# Patient Record
Sex: Female | Born: 1955 | Race: White | Hispanic: No
Health system: Southern US, Academic
[De-identification: ages and names within clinical notes are randomized; demographics above are authoritative.]

## PROBLEM LIST (undated history)

## (undated) ENCOUNTER — Encounter: Payer: PRIVATE HEALTH INSURANCE | Attending: Internal Medicine | Primary: Internal Medicine

## (undated) ENCOUNTER — Ambulatory Visit: Payer: PRIVATE HEALTH INSURANCE | Attending: Hematology & Oncology | Primary: Hematology & Oncology

## (undated) ENCOUNTER — Telehealth: Attending: Internal Medicine | Primary: Internal Medicine

## (undated) ENCOUNTER — Ambulatory Visit

## (undated) ENCOUNTER — Ambulatory Visit
Payer: MEDICARE | Attending: Student in an Organized Health Care Education/Training Program | Primary: Student in an Organized Health Care Education/Training Program

## (undated) ENCOUNTER — Telehealth

## (undated) ENCOUNTER — Encounter: Attending: Internal Medicine | Primary: Internal Medicine

## (undated) ENCOUNTER — Telehealth: Attending: Hematology & Oncology | Primary: Hematology & Oncology

## (undated) ENCOUNTER — Ambulatory Visit: Payer: PRIVATE HEALTH INSURANCE | Attending: Internal Medicine | Primary: Internal Medicine

## (undated) ENCOUNTER — Ambulatory Visit
Payer: Medicare (Managed Care) | Attending: Student in an Organized Health Care Education/Training Program | Primary: Student in an Organized Health Care Education/Training Program

## (undated) ENCOUNTER — Encounter: Payer: PRIVATE HEALTH INSURANCE | Attending: Hematology & Oncology | Primary: Hematology & Oncology

## (undated) ENCOUNTER — Encounter

## (undated) ENCOUNTER — Encounter: Attending: Hematology & Oncology | Primary: Hematology & Oncology

## (undated) ENCOUNTER — Encounter
Attending: Student in an Organized Health Care Education/Training Program | Primary: Student in an Organized Health Care Education/Training Program

## (undated) ENCOUNTER — Ambulatory Visit
Payer: PRIVATE HEALTH INSURANCE | Attending: Student in an Organized Health Care Education/Training Program | Primary: Student in an Organized Health Care Education/Training Program

## (undated) ENCOUNTER — Ambulatory Visit: Attending: Internal Medicine | Primary: Internal Medicine

## (undated) ENCOUNTER — Encounter
Payer: Medicare (Managed Care) | Attending: Student in an Organized Health Care Education/Training Program | Primary: Student in an Organized Health Care Education/Training Program

## (undated) DIAGNOSIS — Z9861 Coronary angioplasty status: Secondary | ICD-10-CM

## (undated) DIAGNOSIS — R195 Other fecal abnormalities: Secondary | ICD-10-CM

## (undated) DIAGNOSIS — J449 Chronic obstructive pulmonary disease, unspecified: Secondary | ICD-10-CM

## (undated) DIAGNOSIS — B1920 Unspecified viral hepatitis C without hepatic coma: Secondary | ICD-10-CM

## (undated) DIAGNOSIS — E785 Hyperlipidemia, unspecified: Secondary | ICD-10-CM

## (undated) DIAGNOSIS — R918 Other nonspecific abnormal finding of lung field: Secondary | ICD-10-CM

## (undated) DIAGNOSIS — B259 Cytomegaloviral disease, unspecified: Secondary | ICD-10-CM

## (undated) DIAGNOSIS — E039 Hypothyroidism, unspecified: Secondary | ICD-10-CM

## (undated) DIAGNOSIS — D696 Thrombocytopenia, unspecified: Secondary | ICD-10-CM

## (undated) DIAGNOSIS — I70219 Atherosclerosis of native arteries of extremities with intermittent claudication, unspecified extremity: Secondary | ICD-10-CM

## (undated) DIAGNOSIS — S72009A Fracture of unspecified part of neck of unspecified femur, initial encounter for closed fracture: Secondary | ICD-10-CM

## (undated) DIAGNOSIS — Z972 Presence of dental prosthetic device (complete) (partial): Secondary | ICD-10-CM

## (undated) DIAGNOSIS — D649 Anemia, unspecified: Secondary | ICD-10-CM

## (undated) DIAGNOSIS — K219 Gastro-esophageal reflux disease without esophagitis: Secondary | ICD-10-CM

## (undated) DIAGNOSIS — R945 Abnormal results of liver function studies: Secondary | ICD-10-CM

## (undated) DIAGNOSIS — I10 Essential (primary) hypertension: Secondary | ICD-10-CM

## (undated) HISTORY — DX: Cytomegaloviral disease, unspecified: B25.9

## (undated) HISTORY — PX: KNEE SURGERY: SHX244

## (undated) HISTORY — DX: Coronary angioplasty status: Z98.61

## (undated) HISTORY — DX: Unspecified viral hepatitis C without hepatic coma: B19.20

## (undated) HISTORY — DX: Thrombocytopenia, unspecified: D69.6

## (undated) HISTORY — DX: Fracture of unspecified part of neck of unspecified femur, initial encounter for closed fracture: S72.009A

## (undated) HISTORY — DX: Abnormal results of liver function studies: R94.5

## (undated) HISTORY — DX: Other fecal abnormalities: R19.5

## (undated) HISTORY — DX: Other nonspecific abnormal finding of lung field: R91.8

## (undated) HISTORY — DX: Atherosclerosis of native arteries of extremities with intermittent claudication, unspecified extremity: I70.219

## (undated) MED ORDER — HYDROXYCHLOROQUINE 200 MG TABLET: Freq: Two times a day (BID) | ORAL | 0.00000 days

## (undated) MED ORDER — PREDNISONE 2.5 MG TABLET: Freq: Every day | ORAL | 0 days

## (undated) MED ORDER — STIOLTO RESPIMAT 2.5 MCG-2.5 MCG/ACTUATION SOLUTION FOR INHALATION: RESPIRATORY_TRACT | 0 days

## (undated) MED ORDER — POTASSIUM CHLORIDE ER 20 MEQ TABLET,EXTENDED RELEASE(PART/CRYST): ORAL | 0.00000 days

---

## 2009-04-25 ENCOUNTER — Ambulatory Visit: Payer: Self-pay | Admitting: Family Medicine

## 2009-04-30 ENCOUNTER — Ambulatory Visit: Payer: Self-pay | Admitting: Family Medicine

## 2009-05-31 ENCOUNTER — Ambulatory Visit: Payer: Self-pay | Admitting: Orthopedic Surgery

## 2009-06-18 ENCOUNTER — Ambulatory Visit: Payer: Self-pay | Admitting: Orthopedic Surgery

## 2009-07-18 ENCOUNTER — Encounter: Payer: Self-pay | Admitting: Orthopedic Surgery

## 2009-07-31 ENCOUNTER — Encounter: Payer: Self-pay | Admitting: Orthopedic Surgery

## 2009-08-31 ENCOUNTER — Encounter: Payer: Self-pay | Admitting: Orthopedic Surgery

## 2012-12-22 ENCOUNTER — Ambulatory Visit: Payer: Self-pay | Admitting: Family Medicine

## 2014-02-08 ENCOUNTER — Ambulatory Visit: Payer: Self-pay | Admitting: Family Medicine

## 2014-04-10 ENCOUNTER — Emergency Department: Payer: Self-pay | Admitting: Emergency Medicine

## 2014-06-11 ENCOUNTER — Emergency Department: Payer: Self-pay | Admitting: Emergency Medicine

## 2014-06-11 LAB — COMPREHENSIVE METABOLIC PANEL
ALT: 28 U/L
ANION GAP: 7 (ref 7–16)
Albumin: 3.6 g/dL (ref 3.4–5.0)
Alkaline Phosphatase: 101 U/L
BUN: 15 mg/dL (ref 7–18)
Bilirubin,Total: 0.6 mg/dL (ref 0.2–1.0)
CHLORIDE: 103 mmol/L (ref 98–107)
CREATININE: 0.73 mg/dL (ref 0.60–1.30)
Calcium, Total: 8.9 mg/dL (ref 8.5–10.1)
Co2: 32 mmol/L (ref 21–32)
Glucose: 100 mg/dL — ABNORMAL HIGH (ref 65–99)
Osmolality: 284 (ref 275–301)
Potassium: 3.1 mmol/L — ABNORMAL LOW (ref 3.5–5.1)
SGOT(AST): 25 U/L (ref 15–37)
Sodium: 142 mmol/L (ref 136–145)
Total Protein: 7.8 g/dL (ref 6.4–8.2)

## 2014-06-11 LAB — TROPONIN I: Troponin-I: <0.02 ng/mL

## 2014-06-11 LAB — CBC
HCT: 46.5 % (ref 35.0–47.0)
HGB: 15.4 g/dL (ref 12.0–16.0)
MCH: 30.4 pg (ref 26.0–34.0)
MCHC: 33.2 g/dL (ref 32.0–36.0)
MCV: 92 fL (ref 80–100)
PLATELETS: 127 10*3/uL — AB (ref 150–440)
RBC: 5.08 10*6/uL (ref 3.80–5.20)
RDW: 14.5 % (ref 11.5–14.5)
WBC: 6.1 10*3/uL (ref 3.6–11.0)

## 2015-01-29 DIAGNOSIS — R195 Other fecal abnormalities: Secondary | ICD-10-CM

## 2015-01-29 HISTORY — DX: Other fecal abnormalities: R19.5

## 2015-05-23 ENCOUNTER — Other Ambulatory Visit: Payer: Self-pay | Admitting: Nurse Practitioner

## 2015-05-23 DIAGNOSIS — M79604 Pain in right leg: Secondary | ICD-10-CM

## 2015-05-24 ENCOUNTER — Ambulatory Visit
Admission: RE | Admit: 2015-05-24 | Discharge: 2015-05-24 | Disposition: A | Discharge status: Home / Self Care | Payer: BLUE CROSS/BLUE SHIELD | Source: Ambulatory Visit | Attending: Nurse Practitioner | Admitting: Nurse Practitioner

## 2015-05-24 DIAGNOSIS — M79604 Pain in right leg: Secondary | ICD-10-CM | POA: Diagnosis not present

## 2015-05-24 DIAGNOSIS — M7989 Other specified soft tissue disorders: Secondary | ICD-10-CM | POA: Diagnosis not present

## 2015-11-06 ENCOUNTER — Other Ambulatory Visit: Payer: Self-pay | Admitting: Preventative Medicine

## 2015-11-06 DIAGNOSIS — Z1231 Encounter for screening mammogram for malignant neoplasm of breast: Secondary | ICD-10-CM

## 2016-05-08 ENCOUNTER — Other Ambulatory Visit: Payer: Self-pay | Admitting: Student

## 2016-05-08 DIAGNOSIS — G8929 Other chronic pain: Secondary | ICD-10-CM

## 2016-05-08 DIAGNOSIS — M25561 Pain in right knee: Principal | ICD-10-CM

## 2016-05-19 ENCOUNTER — Encounter: Payer: Self-pay | Admitting: Radiology

## 2016-05-19 ENCOUNTER — Ambulatory Visit
Admission: RE | Admit: 2016-05-19 | Discharge: 2016-05-19 | Disposition: A | Discharge status: Home / Self Care | Payer: BLUE CROSS/BLUE SHIELD | Source: Ambulatory Visit | Attending: Student | Admitting: Student

## 2016-05-19 DIAGNOSIS — G8929 Other chronic pain: Secondary | ICD-10-CM

## 2016-05-19 DIAGNOSIS — R937 Abnormal findings on diagnostic imaging of other parts of musculoskeletal system: Secondary | ICD-10-CM | POA: Diagnosis not present

## 2016-05-19 DIAGNOSIS — M25561 Pain in right knee: Secondary | ICD-10-CM | POA: Diagnosis present

## 2016-05-19 DIAGNOSIS — I8391 Asymptomatic varicose veins of right lower extremity: Secondary | ICD-10-CM | POA: Insufficient documentation

## 2016-07-07 ENCOUNTER — Encounter: Payer: Self-pay | Admitting: Emergency Medicine

## 2016-07-07 ENCOUNTER — Emergency Department: Payer: BLUE CROSS/BLUE SHIELD

## 2016-07-07 ENCOUNTER — Observation Stay
Admission: EM | Admit: 2016-07-07 | Discharge: 2016-07-12 | Disposition: A | Discharge status: Home / Self Care | Payer: BLUE CROSS/BLUE SHIELD | Attending: Internal Medicine | Admitting: Internal Medicine

## 2016-07-07 DIAGNOSIS — B1920 Unspecified viral hepatitis C without hepatic coma: Secondary | ICD-10-CM

## 2016-07-07 DIAGNOSIS — D649 Anemia, unspecified: Secondary | ICD-10-CM | POA: Diagnosis present

## 2016-07-07 DIAGNOSIS — N179 Acute kidney failure, unspecified: Secondary | ICD-10-CM | POA: Diagnosis present

## 2016-07-07 DIAGNOSIS — E785 Hyperlipidemia, unspecified: Secondary | ICD-10-CM | POA: Diagnosis not present

## 2016-07-07 DIAGNOSIS — R195 Other fecal abnormalities: Secondary | ICD-10-CM

## 2016-07-07 DIAGNOSIS — J449 Chronic obstructive pulmonary disease, unspecified: Secondary | ICD-10-CM | POA: Diagnosis not present

## 2016-07-07 DIAGNOSIS — I708 Atherosclerosis of other arteries: Secondary | ICD-10-CM | POA: Insufficient documentation

## 2016-07-07 DIAGNOSIS — F17210 Nicotine dependence, cigarettes, uncomplicated: Secondary | ICD-10-CM | POA: Diagnosis not present

## 2016-07-07 DIAGNOSIS — Z720 Tobacco use: Secondary | ICD-10-CM

## 2016-07-07 DIAGNOSIS — Z8249 Family history of ischemic heart disease and other diseases of the circulatory system: Secondary | ICD-10-CM | POA: Insufficient documentation

## 2016-07-07 DIAGNOSIS — Z833 Family history of diabetes mellitus: Secondary | ICD-10-CM | POA: Insufficient documentation

## 2016-07-07 DIAGNOSIS — I513 Intracardiac thrombosis, not elsewhere classified: Secondary | ICD-10-CM | POA: Insufficient documentation

## 2016-07-07 DIAGNOSIS — I700 Atherosclerosis of aorta: Secondary | ICD-10-CM | POA: Insufficient documentation

## 2016-07-07 DIAGNOSIS — D591 Other autoimmune hemolytic anemias: Secondary | ICD-10-CM

## 2016-07-07 DIAGNOSIS — I959 Hypotension, unspecified: Secondary | ICD-10-CM

## 2016-07-07 DIAGNOSIS — R768 Other specified abnormal immunological findings in serum: Secondary | ICD-10-CM

## 2016-07-07 DIAGNOSIS — I70222 Atherosclerosis of native arteries of extremities with rest pain, left leg: Secondary | ICD-10-CM | POA: Diagnosis not present

## 2016-07-07 DIAGNOSIS — I739 Peripheral vascular disease, unspecified: Secondary | ICD-10-CM

## 2016-07-07 DIAGNOSIS — D5912 Cold autoimmune hemolytic anemia: Secondary | ICD-10-CM

## 2016-07-07 DIAGNOSIS — I771 Stricture of artery: Secondary | ICD-10-CM | POA: Diagnosis present

## 2016-07-07 DIAGNOSIS — M79606 Pain in leg, unspecified: Secondary | ICD-10-CM | POA: Diagnosis present

## 2016-07-07 DIAGNOSIS — Z9861 Coronary angioplasty status: Secondary | ICD-10-CM

## 2016-07-07 DIAGNOSIS — Z885 Allergy status to narcotic agent status: Secondary | ICD-10-CM | POA: Insufficient documentation

## 2016-07-07 DIAGNOSIS — B259 Cytomegaloviral disease, unspecified: Secondary | ICD-10-CM

## 2016-07-07 DIAGNOSIS — I10 Essential (primary) hypertension: Secondary | ICD-10-CM | POA: Diagnosis present

## 2016-07-07 DIAGNOSIS — Z7982 Long term (current) use of aspirin: Secondary | ICD-10-CM | POA: Insufficient documentation

## 2016-07-07 DIAGNOSIS — N189 Chronic kidney disease, unspecified: Secondary | ICD-10-CM | POA: Diagnosis present

## 2016-07-07 HISTORY — DX: Hyperlipidemia, unspecified: E78.5

## 2016-07-07 HISTORY — DX: Chronic obstructive pulmonary disease, unspecified: J44.9

## 2016-07-07 HISTORY — DX: Essential (primary) hypertension: I10

## 2016-07-07 LAB — URINALYSIS COMPLETE WITH MICROSCOPIC (ARMC ONLY)
BACTERIA UA: NONE SEEN
Bilirubin Urine: NEGATIVE
Glucose, UA: NEGATIVE mg/dL
Hgb urine dipstick: NEGATIVE
Ketones, ur: NEGATIVE mg/dL
LEUKOCYTES UA: NEGATIVE
Nitrite: NEGATIVE
PROTEIN: 30 mg/dL — AB
SPECIFIC GRAVITY, URINE: 1.017 (ref 1.005–1.030)
pH: 5 (ref 5.0–8.0)

## 2016-07-07 LAB — CBC
HEMATOCRIT: 24 % — AB (ref 35.0–47.0)
Hemoglobin: 8.3 g/dL — ABNORMAL LOW (ref 12.0–16.0)
MCH: 34.1 pg — AB (ref 26.0–34.0)
MCHC: 34.5 g/dL (ref 32.0–36.0)
MCV: 99 fL (ref 80.0–100.0)
Platelets: 212 10*3/uL (ref 150–440)
RBC: 2.43 MIL/uL — ABNORMAL LOW (ref 3.80–5.20)
RDW: 20.4 % — AB (ref 11.5–14.5)
WBC: 8.8 10*3/uL (ref 3.6–11.0)

## 2016-07-07 LAB — BASIC METABOLIC PANEL
Anion gap: 7 (ref 5–15)
BUN: 30 mg/dL — AB (ref 6–20)
CHLORIDE: 103 mmol/L (ref 101–111)
CO2: 27 mmol/L (ref 22–32)
CREATININE: 1.69 mg/dL — AB (ref 0.44–1.00)
Calcium: 8.8 mg/dL — ABNORMAL LOW (ref 8.9–10.3)
GFR calc Af Amer: 37 mL/min — ABNORMAL LOW (ref >60)
GFR calc non Af Amer: 32 mL/min — ABNORMAL LOW (ref >60)
GLUCOSE: 107 mg/dL — AB (ref 65–99)
POTASSIUM: 4.1 mmol/L (ref 3.5–5.1)
Sodium: 137 mmol/L (ref 135–145)

## 2016-07-07 LAB — FOLATE: Folate: 42 ng/mL (ref >5.9)

## 2016-07-07 LAB — LACTIC ACID, PLASMA: LACTIC ACID, VENOUS: 1.9 mmol/L (ref 0.5–1.9)

## 2016-07-07 LAB — IRON AND TIBC
Iron: 77 ug/dL (ref 28–170)
SATURATION RATIOS: 30 % (ref 10.4–31.8)
TIBC: 260 ug/dL (ref 250–450)
UIBC: 183 ug/dL

## 2016-07-07 LAB — HEPATIC FUNCTION PANEL
ALBUMIN: 3.8 g/dL (ref 3.5–5.0)
ALT: 9 U/L — AB (ref 14–54)
AST: 26 U/L (ref 15–41)
Alkaline Phosphatase: 66 U/L (ref 38–126)
BILIRUBIN INDIRECT: 1.1 mg/dL — AB (ref 0.3–0.9)
Bilirubin, Direct: 0.3 mg/dL (ref 0.1–0.5)
TOTAL PROTEIN: 7.4 g/dL (ref 6.5–8.1)
Total Bilirubin: 1.4 mg/dL — ABNORMAL HIGH (ref 0.3–1.2)

## 2016-07-07 LAB — FERRITIN: Ferritin: 515 ng/mL — ABNORMAL HIGH (ref 11–307)

## 2016-07-07 LAB — CK: CK TOTAL: 27 U/L — AB (ref 38–234)

## 2016-07-07 MED ORDER — ACETAMINOPHEN 325 MG PO TABS
650.0000 mg | ORAL_TABLET | Freq: Four times a day (QID) | ORAL | Status: DC | PRN
Start: 2016-07-07 — End: 2016-07-12
  Administered 2016-07-08: 650 mg via ORAL
  Filled 2016-07-07 (×2): qty 2

## 2016-07-07 MED ORDER — SODIUM CHLORIDE 0.9 % IV BOLUS (SEPSIS)
1000.0000 mL | Freq: Once | INTRAVENOUS | Status: AC
  Administered 2016-07-07: 1000 mL via INTRAVENOUS

## 2016-07-07 MED ORDER — ONDANSETRON HCL 4 MG/2ML IJ SOLN: 4.0000 mg | Freq: Four times a day (QID) | INTRAMUSCULAR | Status: DC | PRN

## 2016-07-07 MED ORDER — PRAVASTATIN SODIUM 40 MG PO TABS
40.0000 mg | ORAL_TABLET | Freq: Every evening | ORAL | Status: DC
  Administered 2016-07-07 – 2016-07-11 (×4): 40 mg via ORAL
  Filled 2016-07-07 (×4): qty 1

## 2016-07-07 MED ORDER — ASPIRIN EC 81 MG PO TBEC
81.0000 mg | DELAYED_RELEASE_TABLET | Freq: Every day | ORAL | Status: DC
  Filled 2016-07-07: qty 1

## 2016-07-07 MED ORDER — HEPARIN SODIUM (PORCINE) 5000 UNIT/ML IJ SOLN
5000.0000 [IU] | Freq: Three times a day (TID) | INTRAMUSCULAR | Status: DC
  Administered 2016-07-08: 5000 [IU] via SUBCUTANEOUS
  Filled 2016-07-07: qty 1

## 2016-07-07 MED ORDER — IOPAMIDOL (ISOVUE-370) INJECTION 76%
75.0000 mL | Freq: Once | INTRAVENOUS | Status: AC | PRN
  Administered 2016-07-07: 75 mL via INTRAVENOUS
  Filled 2016-07-07: qty 75

## 2016-07-07 MED ORDER — ALBUTEROL SULFATE (2.5 MG/3ML) 0.083% IN NEBU: 3.0000 mL | INHALATION_SOLUTION | RESPIRATORY_TRACT | Status: DC | PRN

## 2016-07-07 MED ORDER — ACETAMINOPHEN 650 MG RE SUPP: 650.0000 mg | Freq: Four times a day (QID) | RECTAL | Status: DC | PRN

## 2016-07-07 MED ORDER — SODIUM CHLORIDE 0.9 % IV SOLN
INTRAVENOUS | Status: AC
  Administered 2016-07-07: 23:00:00 via INTRAVENOUS

## 2016-07-07 MED ORDER — ONDANSETRON HCL 4 MG PO TABS: 4.0000 mg | ORAL_TABLET | Freq: Four times a day (QID) | ORAL | Status: DC | PRN

## 2016-07-07 NOTE — ED Notes (Signed)
Pt ambulates to restroom without difficulty, steady gait noted.

## 2016-07-07 NOTE — ED Triage Notes (Signed)
While at work on Sunday patient felt dizzy and left foot and leg felt numb.  States she has been laying in the bed since Sunday night.  Seen by PCP today and sent to ED for low blood pressure.

## 2016-07-07 NOTE — ED Provider Notes (Signed)
[facility] Emergency Department Provider Note    First MD Initiated Contact with Patient 07/07/16 1649     (approximate)  I have reviewed the triage vital signs and the nursing notes.   HISTORY  Chief Complaint Hypotension    HPI Tina Page is a 60 y.o. female with a history of hypertension presents with 3 days of dizziness and lightheadedness. Patient states that on Sunday she felt lightheaded and dizzy and felt that her left leg went numb and weak. States that the weakness has resolved but she still feels some numbness to the bottom of her left foot. States that she's felt weak and lightheaded since then has been lying in bed since Sunday night. Patient with her PCP today at the recommendation of her daughter. In the primary care physician's office she was hypotensive with systolic BP in low [id number] and recheck in 70's and appeared pale and fatigued and was directed to the ER. She denies any dark stools or hematochezia. Has been scheduled within the past year but this was for melena. She denies any chest pain or shortness of breath. No dysuria. Denies any fevers.   Past Medical History:  Diagnosis Date  . COPD (chronic obstructive pulmonary disease) ([place])   . HLD (hyperlipidemia)   . Hypertension     Patient Active Problem List   Diagnosis Date Noted  . Anemia 07/07/2016  . Leg pain 07/07/2016  . HTN (hypertension) 07/07/2016  . HLD (hyperlipidemia) 07/07/2016  . COPD (chronic obstructive pulmonary disease) ([place]) 07/07/2016  . AKI (acute kidney injury) ([place]) 07/07/2016    Past Surgical History:  Procedure Laterality Date  . KNEE SURGERY      Prior to Admission medications   Medication Sig Start Date End Date Taking? Authorizing Provider  albuterol (PROVENTIL HFA;VENTOLIN HFA) 108 (90 Base) MCG/ACT inhaler Inhale 2 puffs into the lungs as needed for wheezing.    Yes Historical Provider, MD  aspirin EC 81 MG tablet Take 81 mg by mouth daily.    Yes Historical Provider, MD  lisinopril (PRINIVIL,ZESTRIL) 5 MG tablet Take 5 mg by mouth daily.   Yes Historical Provider, MD  potassium chloride SA (K-DUR,KLOR-CON) 20 MEQ tablet Take 20 mEq by mouth daily.   Yes Historical Provider, MD  pravastatin (PRAVACHOL) 40 MG tablet Take 40 mg by mouth every evening.   Yes Historical Provider, MD    Allergies Codeine  Family History  Problem Relation Age of Onset  . Diabetes Mother   . Hypertension Mother     Social History Social History  Substance Use Topics  . Smoking status: Current Every Day Smoker    Packs/day: 0.50    Types: Cigarettes  . Smokeless tobacco: Never Used  . Alcohol use No    Review of Systems Patient denies headaches, rhinorrhea, blurry vision, numbness, shortness of breath, chest pain, edema, cough, abdominal pain, nausea, vomiting, diarrhea, dysuria, fevers, rashes or hallucinations unless otherwise stated above in HPI. ____________________________________________   PHYSICAL EXAM:  VITAL SIGNS: Vitals:   07/07/16 1543 07/07/16 1937  BP: 105/76 123/60  Pulse: 94 97  Resp:  16  Temp: 99.1 F (37.3 C) 98.1 F (36.7 C)    Constitutional: Alert and oriented. ill appearing but in no acute distress. Eyes: Conjunctivae are pale. PERRL. EOMI. Head: Atraumatic. Nose: No congestion/rhinnorhea. Mouth/Throat: Mucous membranes are moist.  Oropharynx non-erythematous. Neck: No stridor. Painless ROM. No cervical spine tenderness to palpation Hematological/Lymphatic/Immunilogical: No cervical lymphadenopathy. Cardiovascular: Normal rate, regular rhythm.  Grossly normal heart sounds.  Good peripheral circulation. Respiratory: Normal respiratory effort.  No retractions. Lungs CTAB. Gastrointestinal: Soft and nontender. No distention. No abdominal bruits. No CVA tenderness. Genitourinary:  Musculoskeletal: No lower extremity tenderness nor edema.  No joint effusions. Neurologic:  Normal speech and language. No  gross focal neurologic deficits are appreciated. No gait instability. Skin:  Skin is warm, dry and intact. No rash noted. Psychiatric: Mood and affect are normal. Speech and behavior are normal. _________________________________________   LABS (all labs ordered are listed, but only abnormal results are displayed)  Results for orders placed or performed during the hospital encounter of 07/07/16 (from the past 24 hour(s))  Basic metabolic panel     Status: Abnormal   Collection Time: 07/07/16  3:46 PM  Result Value Ref Range   Sodium 137 135 - 145 mmol/L   Potassium 4.1 3.5 - 5.1 mmol/L   Chloride 103 101 - 111 mmol/L   CO2 27 22 - 32 mmol/L   Glucose, Bld 107 (H) 65 - 99 mg/dL   BUN 30 (H) 6 - 20 mg/dL   Creatinine, Ser 1.69 (H) 0.44 - 1.00 mg/dL   Calcium 8.8 (L) 8.9 - 10.3 mg/dL   GFR calc non Af Amer 32 (L) >60 mL/min   GFR calc Af Amer 37 (L) >60 mL/min   Anion gap 7 5 - 15  CBC     Status: Abnormal   Collection Time: 07/07/16  3:46 PM  Result Value Ref Range   WBC 8.8 3.6 - 11.0 K/uL   RBC 2.43 (L) 3.80 - 5.20 MIL/uL   Hemoglobin 8.3 (L) 12.0 - 16.0 g/dL   HCT 24.0 (L) 35.0 - 47.0 %   MCV 99.0 80.0 - 100.0 fL   MCH 34.1 (H) 26.0 - 34.0 pg   MCHC 34.5 32.0 - 36.0 g/dL   RDW 20.4 (H) 11.5 - 14.5 %   Platelets 212 150 - 440 K/uL  Urinalysis complete, with microscopic     Status: Abnormal   Collection Time: 07/07/16  3:46 PM  Result Value Ref Range   Color, Urine AMBER (A) YELLOW   APPearance CLEAR (A) CLEAR   Glucose, UA NEGATIVE NEGATIVE mg/dL   Bilirubin Urine NEGATIVE NEGATIVE   Ketones, ur NEGATIVE NEGATIVE mg/dL   Specific Gravity, Urine 1.017 1.005 - 1.030   Hgb urine dipstick NEGATIVE NEGATIVE   pH 5.0 5.0 - 8.0   Protein, ur 30 (A) NEGATIVE mg/dL   Nitrite NEGATIVE NEGATIVE   Leukocytes, UA NEGATIVE NEGATIVE   RBC / HPF 0-5 0 - 5 RBC/hpf   WBC, UA 0-5 0 - 5 WBC/hpf   Bacteria, UA NONE SEEN NONE SEEN   Squamous Epithelial / LPF 0-5 (A) NONE SEEN   Mucous  PRESENT    Hyaline Casts, UA PRESENT   Ferritin     Status: Abnormal   Collection Time: 07/07/16  3:46 PM  Result Value Ref Range   Ferritin 515 (H) 11 - 307 ng/mL  Iron and TIBC     Status: None   Collection Time: 07/07/16  3:46 PM  Result Value Ref Range   Iron 77 28 - 170 ug/dL   TIBC 260 250 - 450 ug/dL   Saturation Ratios 30 10.4 - 31.8 %   UIBC 183 ug/dL  CK     Status: Abnormal   Collection Time: 07/07/16  3:46 PM  Result Value Ref Range   Total CK 27 (L) 38 - 234 U/L  Hepatic function  panel     Status: Abnormal   Collection Time: 07/07/16  3:46 PM  Result Value Ref Range   Total Protein 7.4 6.5 - 8.1 g/dL   Albumin 3.8 3.5 - 5.0 g/dL   AST 26 15 - 41 U/L   ALT 9 (L) 14 - 54 U/L   Alkaline Phosphatase 66 38 - 126 U/L   Total Bilirubin 1.4 (H) 0.3 - 1.2 mg/dL   Bilirubin, Direct 0.3 0.1 - 0.5 mg/dL   Indirect Bilirubin 1.1 (H) 0.3 - 0.9 mg/dL  Lactic acid, plasma     Status: None   Collection Time: 07/07/16  5:23 PM  Result Value Ref Range   Lactic Acid, Venous 1.9 0.5 - 1.9 mmol/L  Type and screen [place]     Status: None (Preliminary result)   Collection Time: 07/07/16  5:23 PM  Result Value Ref Range   ABO/RH(D) A NEG    Antibody Screen POS    Sample Expiration 07/10/2016    ____________________________________________  EKG My review and personal interpretation at Time: 17:41   Indication: hypotension  Rate: 85  Rhythm: sinus Axis: normal Other: occasional PAC, no acute ischemic changes. ____________________________________________  RADIOLOGY  I personally reviewed all radiographic images ordered to evaluate for the above acute complaints and reviewed radiology reports and findings.  These findings were personally discussed with the patient.  Please see medical record for radiology report.  ____________________________________________   PROCEDURES  Procedure(s) performed: none    Critical Care performed:  no ____________________________________________   INITIAL IMPRESSION / ASSESSMENT AND PLAN / ED COURSE  Pertinent labs & imaging results that were available during my care of the patient were reviewed by me and considered in my medical decision making (see chart for details).  DDX: claudication, cva, dissection, aki, acute blood loss anemia,   Tina Page is a 60 y.o. who presents to the ED with Generalized fatigue malaise and an episode of left foot and leg numbness. Patient arrives with low-grade temperature but normotensive but appears pale and ill. EKG shows no evidence of acute ischemia. Her abdominal exam is reassuring without any pulsatile masses. I do appreciate distal pulses to the left lower extremity. Based on her presentation I am concerned for possible dissection and therefore CT imaging ordered to evaluate for acute vascular pathology. Blood work shows evidence of acute kidney injury. She also has evidence of anemia. She's not on any blood thinners at this time.  The patient will be placed on continuous pulse oximetry and telemetry for monitoring.  Laboratory evaluation will be sent to evaluate for the above complaints. The patient will be placed on continuous pulse oximetry and telemetry for monitoring.  Laboratory evaluation will be sent to evaluate for the above complaints.     Clinical Course as of Jul 07 2132  Tue Jul 07, 2016  1731 Stool guaiac is weekly positive.  [PR]    Clinical Course User Index [PR] Merlyn Lot, MD   CT imaging does show evidence of left common iliac stenosis but has distal perfusion. Patient with complex presentation and likely suffered GI bleed given her positive guaiac stools and acute anemia. Also likely suffering some component of acute kidney injury. Patient receiving IV fluids. CT of the head shows no evidence of stroke. Based on her complex presentation do feel patient will require admission for further evaluation and management.  Have  discussed with the patient and available family all diagnostics and treatments performed thus far and all  questions were answered to the best of my ability. The patient demonstrates understanding and agreement with plan.   ____________________________________________   FINAL CLINICAL IMPRESSION(S) / ED DIAGNOSES  Final diagnoses:  Hypotension, unspecified hypotension type  AKI (acute kidney injury) ([place])  Guaiac positive stools  Iliac artery stenosis, left ([facility])      NEW MEDICATIONS STARTED DURING THIS VISIT:  New Prescriptions   No medications on file     Note:  This document was prepared using Dragon voice recognition software and may include unintentional dictation errors.    Merlyn Lot, MD 07/07/16 2133

## 2016-07-07 NOTE — H&P (Signed)
[facility] [place] NAME: Tina Page    MR#:  [id number]  DATE OF BIRTH:  August 24, 1956  DATE OF ADMISSION:  07/07/2016  PRIMARY CARE PHYSICIAN: Frazier Richards, MD   REQUESTING/REFERRING PHYSICIAN: Quentin Cornwall, MD  CHIEF COMPLAINT:   Chief Complaint  Patient presents with  . Hypotension    HISTORY OF PRESENT ILLNESS:  Tina Page  is a 60 y.o. female who presents with Left leg pain and dizziness for the past 3 days. Patient was also found to be anemic with guaiac positive stools here. She was also found on lab work to have some AK I. Her leg pain is worse with ambulation, and improve some with rest. Patient denies any recent melena, though states that she oftentimes does not look at her stool. Hospitals were called for admission  PAST MEDICAL HISTORY:   Past Medical History:  Diagnosis Date  . COPD (chronic obstructive pulmonary disease) ([place])   . HLD (hyperlipidemia)   . Hypertension     PAST SURGICAL HISTORY:   Past Surgical History:  Procedure Laterality Date  . KNEE SURGERY      SOCIAL HISTORY:   Social History  Substance Use Topics  . Smoking status: Current Every Day Smoker    Packs/day: 0.50    Types: Cigarettes  . Smokeless tobacco: Never Used  . Alcohol use No    FAMILY HISTORY:   Family History  Problem Relation Age of Onset  . Diabetes Mother   . Hypertension Mother     DRUG ALLERGIES:   Allergies  Allergen Reactions  . Codeine Anaphylaxis    MEDICATIONS AT HOME:   Prior to Admission medications   Medication Sig Start Date End Date Taking? Authorizing Provider  albuterol (PROVENTIL HFA;VENTOLIN HFA) 108 (90 Base) MCG/ACT inhaler Inhale 2 puffs into the lungs as needed for wheezing.    Yes Historical Provider, MD  aspirin EC 81 MG tablet Take 81 mg by mouth daily.   Yes Historical Provider, MD  lisinopril (PRINIVIL,ZESTRIL) 5 MG tablet Take 5 mg by mouth daily.   Yes Historical  Provider, MD  potassium chloride SA (K-DUR,KLOR-CON) 20 MEQ tablet Take 20 mEq by mouth daily.   Yes Historical Provider, MD  pravastatin (PRAVACHOL) 40 MG tablet Take 40 mg by mouth every evening.   Yes Historical Provider, MD    REVIEW OF SYSTEMS:  Review of Systems  Constitutional: Negative for chills, fever, malaise/fatigue and weight loss.  HENT: Negative for ear pain, hearing loss and tinnitus.   Eyes: Negative for blurred vision, double vision, pain and redness.  Respiratory: Negative for cough, hemoptysis and shortness of breath.   Cardiovascular: Negative for chest pain, palpitations, orthopnea and leg swelling.  Gastrointestinal: Negative for abdominal pain, constipation, diarrhea, nausea and vomiting.  Genitourinary: Negative for dysuria, frequency and hematuria.  Musculoskeletal: Negative for back pain, joint pain and neck pain.       Left leg pain  Skin:       No acne, rash, or lesions  Neurological: Positive for dizziness. Negative for tremors, focal weakness and weakness.  Endo/Heme/Allergies: Negative for polydipsia. Does not bruise/bleed easily.  Psychiatric/Behavioral: Negative for depression. The patient is not nervous/anxious and does not have insomnia.      VITAL SIGNS:   Vitals:   07/07/16 1542 07/07/16 1543 07/07/16 1937  BP:  105/76 123/60  Pulse:  94 97  Resp:   16  Temp:  99.1 F (37.3 C) 98.1 F (  36.7 C)  TempSrc:  Oral Oral  SpO2:  100% 96%  Weight: 64.9 kg (143 lb)    Height: 5\' 3"  (1.6 m)     Wt Readings from Last 3 Encounters:  07/07/16 64.9 kg (143 lb)    PHYSICAL EXAMINATION:  Physical Exam  Vitals reviewed. Constitutional: She is oriented to person, place, and time. She appears well-developed and well-nourished. No distress.  HENT:  Head: Normocephalic and atraumatic.  Mouth/Throat: Oropharynx is clear and moist.  Eyes: Conjunctivae and EOM are normal. Pupils are equal, round, and reactive to light. No scleral icterus.  Neck: Normal  range of motion. Neck supple. No JVD present. No thyromegaly present.  Cardiovascular: Normal rate, regular rhythm and intact distal pulses.  Exam reveals no gallop and no friction rub.   No murmur heard. Respiratory: Effort normal and breath sounds normal. No respiratory distress. She has no wheezes. She has no rales.  GI: Soft. Bowel sounds are normal. She exhibits no distension. There is no tenderness.  Musculoskeletal: Normal range of motion. She exhibits no edema.  No arthritis, no gout  Lymphadenopathy:    She has no cervical adenopathy.  Neurological: She is alert and oriented to person, place, and time. No cranial nerve deficit.  No dysarthria, no aphasia  Skin: Skin is warm and dry. No rash noted. No erythema.  Psychiatric: She has a normal mood and affect. Her behavior is normal. Judgment and thought content normal.    LABORATORY PANEL:   CBC  Recent Labs Lab 07/07/16 1546  WBC 8.8  HGB 8.3*  HCT 24.0*  PLT 212   ------------------------------------------------------------------------------------------------------------------  Chemistries   Recent Labs Lab 07/07/16 1546  NA 137  K 4.1  CL 103  CO2 27  GLUCOSE 107*  BUN 30*  CREATININE 1.69*  CALCIUM 8.8*  AST 26  ALT 9*  ALKPHOS 66  BILITOT 1.4*   ------------------------------------------------------------------------------------------------------------------  Cardiac Enzymes No results for input(s): TROPONINI in the last 168 hours. ------------------------------------------------------------------------------------------------------------------  RADIOLOGY:  Dg Chest 2 View  Result Date: 07/07/2016 CLINICAL DATA:  60 year old female felt dizzy 2 days ago. Low blood pressure. Initial encounter. EXAM: CHEST  2 VIEW COMPARISON:  06/11/2014 chest x-ray. FINDINGS: Central pulmonary vascular prominence. No infiltrate, congestive heart failure or pneumothorax. No plain film evidence of pulmonary malignancy.  Heart size within normal limits. Calcified aorta. No acute osseous abnormality. IMPRESSION: No active cardiopulmonary disease. Electronically Signed   By: Genia Del M.D.   On: 07/07/2016 17:17   Ct Head Wo Contrast  Result Date: 07/07/2016 CLINICAL DATA:  60 y/o  F; left leg numbness. EXAM: CT HEAD WITHOUT CONTRAST TECHNIQUE: Contiguous axial images were obtained from the base of the skull through the vertex without intravenous contrast. COMPARISON:  None. FINDINGS: Brain: No evidence of acute infarction, hemorrhage, hydrocephalus, extra-axial collection or mass lesion/mass effect. Vascular: Contrast opacification of dural venous sinuses and arteries from recent CT angiogram. Moderate calcific atherosclerosis of cavernous internal carotid arteries. Skull: Normal. Negative for fracture or focal lesion. Sinuses/Orbits: No acute finding. Other: None. IMPRESSION: No acute intracranial abnormality identified. If symptoms persist or if clinically indicated MRI is more sensitive for acute stroke. Unremarkable CT of head for age. Electronically Signed   By: Kristine Garbe M.D.   On: 07/07/2016 19:41   Ct Angio Chest/abd/pel For Dissection W And/or Wo Contrast  Result Date: 07/07/2016 CLINICAL DATA:  Dizziness with left foot and leg numbness low blood pressure EXAM: CT ANGIOGRAPHY CHEST, ABDOMEN AND PELVIS TECHNIQUE: Multidetector CT  imaging through the chest, abdomen and pelvis was performed using the standard protocol during bolus administration of intravenous contrast. Multiplanar reconstructed images and MIPs were obtained and reviewed to evaluate the vascular anatomy. CONTRAST:  75 mL Isovue 370 intravenous COMPARISON:  chest x-ray 07/07/2016 FINDINGS: CTA CHEST FINDINGS Cardiovascular: The aorta demonstrates normal caliber and no evidence for aneurysm or dissection. No evidence for mediastinal hematoma. Mild coronary artery calcifications. Heart size normal. No pericardial effusion.  Mediastinum/Nodes: No significantly enlarged mediastinal, hilar or axillary lymph nodes. Trachea and mainstem bronchi appear within normal limits. Thyroid gland is within normal limits. The esophagus is unremarkable. Lungs/Pleura: Mild emphysematous changes within the bilateral upper lobes. No acute infiltrate, pneumothorax, or pleural effusion. Musculoskeletal: No chest wall abnormality. No acute or significant osseous findings. Review of the MIP images confirms the above findings. CTA ABDOMEN AND PELVIS FINDINGS VASCULAR Aorta: Abdominal aorta is non aneurysmal. Irregular mural thrombus and atherosclerotic calcification is present throughout the abdominal aorta. There is no dissection seen. Celiac: Patent SMA: Patent Renals: Single right and left renal arteries which appear without high-grade stenosis. Minimal calcification left renal artery distal to its origin. IMA: Stenosis at the IMA origin.  Normal distal enhancement. Inflow: Moderate diffuse disease of the infrarenal abdominal aorta. 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 Moderate diffuse disease of the left external iliac artery. There is mild calcification of the right iliac artery without high-grade stenosis. Mild diffuse disease of the right external iliac artery. Femoral arteries at the groin appear grossly patent. Veins: Limited evaluation due to arterial phase. Review of the MIP images confirms the above findings. NON-VASCULAR Hepatobiliary: No focal liver abnormality is seen. No gallstones, gallbladder wall thickening, or biliary dilatation. Pancreas: Unremarkable. No pancreatic ductal dilatation or surrounding inflammatory changes. Spleen: Normal in size without focal abnormality. Adrenals/Urinary Tract: Adrenal glands are unremarkable. Kidneys are normal, without renal calculi,  focal lesion, or hydronephrosis. Bladder is unremarkable. Stomach/Bowel: Stomach is within normal limits. Appendix appears normal. No evidence of bowel wall thickening, distention, or inflammatory changes. Lymphatic: No significantly enlarged pelvic or abdominal lymph nodes. Reproductive: Uterus and bilateral adnexa are unremarkable. Prominent enhancing left greater than right pelvic vessels raises possibility for pelvic congestion. Other: No free air or free fluid Musculoskeletal: No acute or significant osseous findings. Review of the MIP images confirms the above findings. IMPRESSION: 1. No CT evidence for aortic dissection, aortic aneurysm or mediastinal hematoma. 2. 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 There is flow present within the distal left common iliac artery prior to its bifurcation. There is diffuse disease of the left external iliac artery with patency of the imaged proximal left common femoral artery. 3. Mild emphysematous disease within the upper lobes. 4. Prominent left greater than right parauterine enhancing vessels raises possibility of pelvic congestion syndrome. Electronically Signed   By: Donavan Foil M.D.   On: 07/07/2016 18:41    EKG:   Orders placed or performed during the hospital encounter of 07/07/16  . ED EKG  . ED EKG  . EKG 12-Lead  . EKG 12-Lead  . ED EKG 12-Lead  . ED EKG 12-Lead  . EKG 12-Lead  . EKG 12-Lead    IMPRESSION AND PLAN:  Principal Problem:   AKI (acute kidney injury) (  [place]) - gentle IV fluids tonight, avoid nephrotoxins, monitor for improvement Active Problems:   Anemia - patient was Hemoccult positive, unclear if she may have had some GI bleed. She has been taking NSAIDs recently. GI consult   Leg pain - vascular consult for evaluation of the same   HTN (hypertension) - stable, continue home meds   HLD (hyperlipidemia)  - home meds   COPD (chronic obstructive pulmonary disease) ([place]) - continue home inhalers  All the records are reviewed and case discussed with ED provider. Management plans discussed with the patient and/or family.  DVT PROPHYLAXIS: SubQ heparin  GI PROPHYLAXIS: None  ADMISSION STATUS: Observation  CODE STATUS: Full Code Status History    This patient does not have a recorded code status. Please follow your organizational policy for patients in this situation.      TOTAL TIME TAKING CARE OF THIS PATIENT: 40 minutes.    Caydyn Sprung FIELDING 07/07/2016, 8:53 PM  [facility]  Office  [phone]  CC: Primary care physician; Frazier Richards, MD

## 2016-07-07 NOTE — ED Notes (Signed)
Patient transported to radiology

## 2016-07-08 ENCOUNTER — Encounter
Admission: EM | Disposition: A | Discharge status: Home / Self Care | Payer: Self-pay | Source: Home / Self Care | Attending: Student in an Organized Health Care Education/Training Program

## 2016-07-08 ENCOUNTER — Observation Stay: Payer: BLUE CROSS/BLUE SHIELD | Admitting: Anesthesiology

## 2016-07-08 DIAGNOSIS — J449 Chronic obstructive pulmonary disease, unspecified: Secondary | ICD-10-CM

## 2016-07-08 DIAGNOSIS — R42 Dizziness and giddiness: Secondary | ICD-10-CM

## 2016-07-08 DIAGNOSIS — I10 Essential (primary) hypertension: Secondary | ICD-10-CM

## 2016-07-08 DIAGNOSIS — M79605 Pain in left leg: Secondary | ICD-10-CM

## 2016-07-08 HISTORY — PX: ESOPHAGOGASTRODUODENOSCOPY (EGD) WITH PROPOFOL: SHX5813

## 2016-07-08 LAB — VITAMIN B12: Vitamin B-12: 230 pg/mL (ref 180–914)

## 2016-07-08 LAB — BASIC METABOLIC PANEL
Anion gap: 6 (ref 5–15)
BUN: 20 mg/dL (ref 6–20)
CALCIUM: 7.8 mg/dL — AB (ref 8.9–10.3)
CO2: 22 mmol/L (ref 22–32)
CREATININE: 1.14 mg/dL — AB (ref 0.44–1.00)
Chloride: 110 mmol/L (ref 101–111)
GFR calc Af Amer: >60 mL/min (ref >60)
GFR calc non Af Amer: 52 mL/min — ABNORMAL LOW (ref >60)
GLUCOSE: 93 mg/dL (ref 65–99)
Potassium: 3.6 mmol/L (ref 3.5–5.1)
Sodium: 138 mmol/L (ref 135–145)

## 2016-07-08 LAB — CBC
HCT: 21 % — ABNORMAL LOW (ref 35.0–47.0)
Hemoglobin: 7.3 g/dL — ABNORMAL LOW (ref 12.0–16.0)
MCH: 33.8 pg (ref 26.0–34.0)
MCHC: 34.7 g/dL (ref 32.0–36.0)
MCV: 97.4 fL (ref 80.0–100.0)
PLATELETS: 178 10*3/uL (ref 150–440)
RBC: 2.16 MIL/uL — ABNORMAL LOW (ref 3.80–5.20)
RDW: 20.2 % — ABNORMAL HIGH (ref 11.5–14.5)
WBC: 6.8 10*3/uL (ref 3.6–11.0)

## 2016-07-08 SURGERY — ESOPHAGOGASTRODUODENOSCOPY (EGD) WITH PROPOFOL
Anesthesia: General

## 2016-07-08 MED ORDER — MIDAZOLAM HCL 2 MG/2ML IJ SOLN
INTRAMUSCULAR | Status: DC | PRN
Start: 2016-07-08 — End: 2016-07-08
  Administered 2016-07-08: 1 mg via INTRAVENOUS

## 2016-07-08 MED ORDER — LIDOCAINE HCL (CARDIAC) 20 MG/ML IV SOLN
INTRAVENOUS | Status: DC | PRN
  Administered 2016-07-08: 30 mg via INTRAVENOUS

## 2016-07-08 MED ORDER — IPRATROPIUM-ALBUTEROL 0.5-2.5 (3) MG/3ML IN SOLN
3.0000 mL | Freq: Once | RESPIRATORY_TRACT | Status: AC
  Administered 2016-07-08: 3 mL via RESPIRATORY_TRACT

## 2016-07-08 MED ORDER — PROPOFOL 500 MG/50ML IV EMUL
INTRAVENOUS | Status: DC | PRN
  Administered 2016-07-08: 140 ug/kg/min via INTRAVENOUS

## 2016-07-08 MED ORDER — SODIUM CHLORIDE 0.9 % IV SOLN
INTRAVENOUS | Status: AC
  Administered 2016-07-08: 09:00:00 via INTRAVENOUS

## 2016-07-08 MED ORDER — PROPOFOL 10 MG/ML IV BOLUS
INTRAVENOUS | Status: DC | PRN
  Administered 2016-07-08: 40 mg via INTRAVENOUS
  Administered 2016-07-08: 10 mg via INTRAVENOUS

## 2016-07-08 MED ORDER — FAMOTIDINE IN NACL 20-0.9 MG/50ML-% IV SOLN
20.0000 mg | Freq: Two times a day (BID) | INTRAVENOUS | Status: DC
  Administered 2016-07-08 – 2016-07-10 (×5): 20 mg via INTRAVENOUS
  Filled 2016-07-08 (×6): qty 50

## 2016-07-08 NOTE — Anesthesia Postprocedure Evaluation (Signed)
Anesthesia Post Note  Patient: Tina Page  Procedure(s) Performed: Procedure(s) (LRB): ESOPHAGOGASTRODUODENOSCOPY (EGD) WITH PROPOFOL (N/A)  Patient location during evaluation: Endoscopy Anesthesia Type: General Level of consciousness: awake and alert Pain management: pain level controlled Vital Signs Assessment: post-procedure vital signs reviewed and stable Respiratory status: spontaneous breathing, nonlabored ventilation, respiratory function stable and patient connected to nasal cannula oxygen Cardiovascular status: blood pressure returned to baseline and stable Postop Assessment: no signs of nausea or vomiting Anesthetic complications: no    Last Vitals:  Vitals:   07/08/16 1703 07/08/16 1731  BP: 132/63 (!) 145/60  Pulse: 78 69  Resp: 20 20  Temp:  36.6 C    Last Pain:  Vitals:   07/08/16 1731  TempSrc: Oral  PainSc:                  Precious Haws [place]

## 2016-07-08 NOTE — Consult Note (Signed)
[facility] Vascular Consult Note  MRN : [id number]  Tina Page is a 60 y.o. (05-16-1956) female who presents with chief complaint of  Chief Complaint  Patient presents with  . Hypotension  .  History of Present Illness: I am asked to evaluate Tina Page by Dr. Jannifer Franklin. She   is a 60 y.o. female who presented yesterday to the emergency room with left leg pain and dizziness. Patient notes the onset of symptoms was approximately 3 days prior to being seen in the emergency room.  Her leg pain is worse with ambulation, and improve some with rest. She describes it as a numbness and aching. CT angiogram was performed in the emergency room which is reported as stricture or stenosis possible occlusion of the left common iliac artery. These symptoms are persistent when she is at rest suggesting mild rest pain. She denies past ulcerations and/or wounds.  The patient denies past vascular problems other than varicose veins. She has not undergone any past arterial interventions and/or surgeries. She has had several chronic knee issues but states that the pain this time is markedly different.  Patient was also found to be anemic with guaiac positive stools here. Workup which has included 2 EGDs one of which was performed today as well as a recent colonoscopy is negative for bleeding sites. Heme negative stool was identified today.  Current Facility-Administered Medications  Medication Dose Route Frequency Provider Last Rate Last Dose  . 0.9 %  sodium chloride infusion   Intravenous Continuous Demetrios Loll, MD 100 mL/hr at 07/08/16 0929    . acetaminophen (TYLENOL) tablet 650 mg  650 mg Oral Q6H PRN Lance Coon, MD   650 mg at 07/08/16 [id number]   Or  . acetaminophen (TYLENOL) suppository 650 mg  650 mg Rectal Q6H PRN Lance Coon, MD      . albuterol (PROVENTIL) (2.5 MG/3ML) 0.083% nebulizer solution 3 mL  3 mL Inhalation PRN Lance Coon, MD      . famotidine (PEPCID) IVPB 20 mg  premix  20 mg Intravenous Q12H Demetrios Loll, MD   20 mg at 07/08/16 1028  . ondansetron (ZOFRAN) tablet 4 mg  4 mg Oral Q6H PRN Lance Coon, MD       Or  . ondansetron [facility]) injection 4 mg  4 mg Intravenous Q6H PRN Lance Coon, MD      . pravastatin (PRAVACHOL) tablet 40 mg  40 mg Oral QPM Lance Coon, MD   40 mg at 07/08/16 1752    Past Medical History:  Diagnosis Date  . COPD (chronic obstructive pulmonary disease) ([place])   . HLD (hyperlipidemia)   . Hypertension     Past Surgical History:  Procedure Laterality Date  . KNEE SURGERY      Social History Social History  Substance Use Topics  . Smoking status: Current Every Day Smoker    Packs/day: 0.50    Types: Cigarettes  . Smokeless tobacco: Never Used  . Alcohol use No    Family History Family History  Problem Relation Age of Onset  . Diabetes Mother   . Hypertension Mother   No family history of bleeding/clotting disorders, porphyria or autoimmune disease   Allergies  Allergen Reactions  . Codeine Anaphylaxis     REVIEW OF SYSTEMS (Negative unless checked)  Constitutional: [] Weight loss  [] Fever  [] Chills Cardiac: [] Chest pain   [] Chest pressure   [] Palpitations   [] Shortness of breath when laying flat   [] Shortness of breath at  rest   [] Shortness of breath with exertion. Vascular:  [x] Pain in legs with walking   [x] Pain in legs at rest   [] Pain in legs when laying flat   [x] Claudication   [] Pain in feet when walking  [] Pain in feet at rest  [] Pain in feet when laying flat   [] History of DVT   [] Phlebitis   [x] Swelling in legs   [x] Varicose veins   [] Non-healing ulcers Pulmonary:   [] Uses home oxygen   [] Productive cough   [] Hemoptysis   [] Wheeze  [] COPD   [] Asthma Neurologic:  [] Dizziness  [] Blackouts   [] Seizures   [] History of stroke   [] History of TIA  [] Aphasia   [] Temporary blindness   [] Dysphagia   [] Weakness or numbness in arms   [] Weakness or numbness in legs Musculoskeletal:  [] Arthritis   [] Joint  swelling   [] Joint pain   [] Low back pain Hematologic:  [] Easy bruising  [] Easy bleeding   [] Hypercoagulable state   [x] Anemic  [] Hepatitis Gastrointestinal:  [] Blood in stool   [] Vomiting blood  [x] Gastroesophageal reflux/heartburn   [] Difficulty swallowing. Genitourinary:  [] Chronic kidney disease   [] Difficult urination  [] Frequent urination  [] Burning with urination   [] Blood in urine Skin:  [] Rashes   [] Ulcers   [] Wounds Psychological:  [] History of anxiety   []  History of major depression.  Physical Examination  Vitals:   07/08/16 1643 07/08/16 1653 07/08/16 1703 07/08/16 1731  BP: (!) 157/101 121/64 132/63 (!) 145/60  Pulse: 88 79 78 69  Resp: 19 (!) 22 20 20   Temp:    97.9 F (36.6 C)  TempSrc:    Oral  SpO2: 100% 97% 93% 93%  Weight:      Height:       Body mass index is 25.33 kg/m. Gen:  WD/WN, NAD Head: [place]/AT, No temporalis wasting. Prominent temp pulse not noted. Ear/Nose/Throat: Hearing grossly intact, nares w/o erythema or drainage, oropharynx w/o Erythema/Exudate Eyes: Sclera non-icteric, conjunctiva clear Neck: Trachea midline.  No JVD.  Pulmonary:  Good air movement, respirations not labored, equal bilaterally.  Cardiac: RRR, normal S1, S2. Vascular: Left foot cool compared to the right with sluggish capillary refill Vessel Right Left  Radial Palpable Palpable  Ulnar Palpable Palpable  Brachial Palpable Palpable  Carotid Palpable, without bruit Palpable, without bruit  Aorta Not palpable N/A  Femoral Palpable Not Palpable  Popliteal Palpable Not Palpable  PT Palpable Not Palpable  DP  Palpable Not Palpable   Gastrointestinal: soft, non-tender/non-distended. No guarding/reflex.  Musculoskeletal: M/S 5/5 throughout.  No atrophy or deformity. Neurologic: Sensation grossly intact in extremities.  Symmetrical.  Speech is fluent. Motor exam as listed above. Psychiatric: Judgment intact, Mood & affect appropriate for pt's clinical situation. Dermatologic: No  rashes or open wounds or sores. Lymph : + Cervical, Axillary, or Inguinal lymphadenopathy.      CBC Lab Results  Component Value Date   WBC 6.8 07/08/2016   HGB 7.3 (L) 07/08/2016   HCT 21.0 (L) 07/08/2016   MCV 97.4 07/08/2016   PLT 178 07/08/2016    BMET    Component Value Date/Time   NA 138 07/08/2016 0509   NA 142 06/11/2014 1732   K 3.6 07/08/2016 0509   K 3.1 (L) 06/11/2014 1732   CL 110 07/08/2016 0509   CL 103 06/11/2014 1732   CO2 22 07/08/2016 0509   CO2 32 06/11/2014 1732   GLUCOSE 93 07/08/2016 0509   GLUCOSE 100 (H) 06/11/2014 1732   BUN 20 07/08/2016 0509  BUN 15 06/11/2014 1732   CREATININE 1.14 (H) 07/08/2016 0509   CREATININE 0.73 06/11/2014 1732   CALCIUM 7.8 (L) 07/08/2016 0509   CALCIUM 8.9 06/11/2014 1732   GFRNONAA 52 (L) 07/08/2016 0509   GFRNONAA >60 06/11/2014 1732   GFRAA >60 07/08/2016 0509   GFRAA >60 06/11/2014 1732   Estimated Creatinine Clearance: 48.7 mL/min (by C-G formula based on SCr of 1.14 mg/dL (H)).  COAG No results found for: INR, PROTIME  Radiology Dg Chest 2 View  Result Date: 07/07/2016 CLINICAL DATA:  60 year old female felt dizzy 2 days ago. Low blood pressure. Initial encounter. EXAM: CHEST  2 VIEW COMPARISON:  06/11/2014 chest x-ray. FINDINGS: Central pulmonary vascular prominence. No infiltrate, congestive heart failure or pneumothorax. No plain film evidence of pulmonary malignancy. Heart size within normal limits. Calcified aorta. No acute osseous abnormality. IMPRESSION: No active cardiopulmonary disease. Electronically Signed   By: Genia Del M.D.   On: 07/07/2016 17:17   Ct Head Wo Contrast  Result Date: 07/07/2016 CLINICAL DATA:  60 y/o  F; left leg numbness. EXAM: CT HEAD WITHOUT CONTRAST TECHNIQUE: Contiguous axial images were obtained from the base of the skull through the vertex without intravenous contrast. COMPARISON:  None. FINDINGS: Brain: No evidence of acute infarction, hemorrhage, hydrocephalus,  extra-axial collection or mass lesion/mass effect. Vascular: Contrast opacification of dural venous sinuses and arteries from recent CT angiogram. Moderate calcific atherosclerosis of cavernous internal carotid arteries. Skull: Normal. Negative for fracture or focal lesion. Sinuses/Orbits: No acute finding. Other: None. IMPRESSION: No acute intracranial abnormality identified. If symptoms persist or if clinically indicated MRI is more sensitive for acute stroke. Unremarkable CT of head for age. Electronically Signed   By: Kristine Garbe M.D.   On: 07/07/2016 19:41   Ct Angio Chest/abd/pel For Dissection W And/or Wo Contrast  Result Date: 07/07/2016 CLINICAL DATA:  Dizziness with left foot and leg numbness low blood pressure EXAM: CT ANGIOGRAPHY CHEST, ABDOMEN AND PELVIS TECHNIQUE: Multidetector CT imaging through the chest, abdomen and pelvis was performed using the standard protocol during bolus administration of intravenous contrast. Multiplanar reconstructed images and MIPs were obtained and reviewed to evaluate the vascular anatomy. CONTRAST:  75 mL Isovue 370 intravenous COMPARISON:  chest x-ray 07/07/2016 FINDINGS: CTA CHEST FINDINGS Cardiovascular: The aorta demonstrates normal caliber and no evidence for aneurysm or dissection. No evidence for mediastinal hematoma. Mild coronary artery calcifications. Heart size normal. No pericardial effusion. Mediastinum/Nodes: No significantly enlarged mediastinal, hilar or axillary lymph nodes. Trachea and mainstem bronchi appear within normal limits. Thyroid gland is within normal limits. The esophagus is unremarkable. Lungs/Pleura: Mild emphysematous changes within the bilateral upper lobes. No acute infiltrate, pneumothorax, or pleural effusion. Musculoskeletal: No chest wall abnormality. No acute or significant osseous findings. Review of the MIP images confirms the above findings. CTA ABDOMEN AND PELVIS FINDINGS VASCULAR Aorta: Abdominal aorta is non  aneurysmal. Irregular mural thrombus and atherosclerotic calcification is present throughout the abdominal aorta. There is no dissection seen. Celiac: Patent SMA: Patent Renals: Single right and left renal arteries which appear without high-grade stenosis. Minimal calcification left renal artery distal to its origin. IMA: Stenosis at the IMA origin.  Normal distal enhancement. Inflow: Moderate diffuse disease of the infrarenal abdominal aorta. 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 Moderate diffuse disease of the left external iliac artery. There is mild calcification  of the right iliac artery without high-grade stenosis. Mild diffuse disease of the right external iliac artery. Femoral arteries at the groin appear grossly patent. Veins: Limited evaluation due to arterial phase. Review of the MIP images confirms the above findings. NON-VASCULAR Hepatobiliary: No focal liver abnormality is seen. No gallstones, gallbladder wall thickening, or biliary dilatation. Pancreas: Unremarkable. No pancreatic ductal dilatation or surrounding inflammatory changes. Spleen: Normal in size without focal abnormality. Adrenals/Urinary Tract: Adrenal glands are unremarkable. Kidneys are normal, without renal calculi, focal lesion, or hydronephrosis. Bladder is unremarkable. Stomach/Bowel: Stomach is within normal limits. Appendix appears normal. No evidence of bowel wall thickening, distention, or inflammatory changes. Lymphatic: No significantly enlarged pelvic or abdominal lymph nodes. Reproductive: Uterus and bilateral adnexa are unremarkable. Prominent enhancing left greater than right pelvic vessels raises possibility for pelvic congestion. Other: No free air or free fluid Musculoskeletal: No acute or significant osseous findings. Review of the MIP images  confirms the above findings. IMPRESSION: 1. No CT evidence for aortic dissection, aortic aneurysm or mediastinal hematoma. 2. 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 There is flow present within the distal left common iliac artery prior to its bifurcation. There is diffuse disease of the left external iliac artery with patency of the imaged proximal left common femoral artery. 3. Mild emphysematous disease within the upper lobes. 4. Prominent left greater than right parauterine enhancing vessels raises possibility of pelvic congestion syndrome. Electronically Signed   By: Donavan Foil M.D.   On: 07/07/2016 18:41      Assessment/Plan 1. 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 There is mild to moderate amounts of atheromatous plaque throughout the aorta. There is moderate plaque within the right common iliac. This correlates well with physical exam which shows absent pulses on the left. My recommendation is for angiography and intervention with likely bilateral iliac artery stenting. I have discussed this with the patient as well as alternatives. The risks and benefits been reviewed and the patient has agreed to proceed. Given her history of anemia in the face of upper and lower GI studies being normal and her stool being guaiac-negative I believe it is safe to proceed. Given the fact that she is having rest pain I do not believe that there is the luxury of completing her anemia workup beforehand. Given these findings I believe it is safe to administer a bolus of heparin at the time of the procedure. I will plan for aspirin as the primary antiplatelet therapy post procedure. The risks and benefits of been reviewed all questions  answered patient has agreed to proceed and I will arrange for angiography with the hope for intervention on Friday 2. Anemia:  She will undergo further evaluation workup for her anemia part of which is ongoing at this time. 3. COPD:  She will continue her aerosol treatments without interruption. 4.  Hypertension:  Antihypertensive medications will be continued 5.  Hyperlipidemia: Statin therapy will be continued   Hortencia Pilar, MD  07/08/2016 6:42 PM    This note was created with Dragon medical transcription system.  Any error is purely unintentional

## 2016-07-08 NOTE — Anesthesia Procedure Notes (Signed)
Date/Time: 07/08/2016 4:18 PM Performed by: Johnna Acosta Pre-anesthesia Checklist: Patient identified, Emergency Drugs available, Suction available, Patient being monitored and Timeout performed Patient Re-evaluated:Patient Re-evaluated prior to inductionOxygen Delivery Method: Nasal cannula

## 2016-07-08 NOTE — Consult Note (Signed)
Patient EGD did no show any blood or ulcers or AVM.  Her stool today was brown and heme negative.  She had an EGD and colonoscopy at [facility] a year ago and was told they were normal.  I think she needs a hematology work up for her anemia.  May do capsule endo Friday to check small bowel.

## 2016-07-08 NOTE — Progress Notes (Signed)
[facility] [facility] [place] NAME: Tina Page    MR#:  [id number]  DATE OF BIRTH:  1955/10/18  SUBJECTIVE:  CHIEF COMPLAINT:   Chief Complaint  Patient presents with  . Hypotension   Weakness. No melena or bloody stool. REVIEW OF SYSTEMS:  Review of Systems  Constitutional: Positive for malaise/fatigue. Negative for chills and fever.  Eyes: Negative for blurred vision and double vision.  Respiratory: Negative for cough, hemoptysis, shortness of breath, wheezing and stridor.   Cardiovascular: Negative for chest pain and leg swelling.  Gastrointestinal: Negative for abdominal pain, blood in stool, diarrhea, melena, nausea and vomiting.  Genitourinary: Negative for dysuria, hematuria and urgency.  Musculoskeletal: Negative for joint pain.  Neurological: Positive for weakness. Negative for dizziness, focal weakness and loss of consciousness.  Psychiatric/Behavioral: Negative for depression. The patient is not nervous/anxious.     DRUG ALLERGIES:   Allergies  Allergen Reactions  . Codeine Anaphylaxis   VITALS:  Blood pressure 120/60, pulse 82, temperature 98.3 F (36.8 C), temperature source Oral, resp. rate 18, height 5\' 3"  (1.6 m), weight 143 lb (64.9 kg), SpO2 97 %. PHYSICAL EXAMINATION:  Physical Exam  Constitutional: She is oriented to person, place, and time and well-developed, well-nourished, and in no distress.  HENT:  Head: Normocephalic.  Mouth/Throat: Oropharynx is clear and moist.  Eyes: Conjunctivae and EOM are normal.  Neck: Normal range of motion. Neck supple. No JVD present. No tracheal deviation present.  Cardiovascular: Normal rate, regular rhythm and normal heart sounds.  Exam reveals no gallop.   No murmur heard. Pulmonary/Chest: Effort normal and breath sounds normal. No respiratory distress. She has no wheezes. She has no rales.  Abdominal: She exhibits no distension. There is no tenderness.  Musculoskeletal:  Normal range of motion. She exhibits no edema or tenderness.  Neurological: She is alert and oriented to person, place, and time. No cranial nerve deficit.  Skin: No rash noted. No erythema.  Psychiatric: Affect normal.   LABORATORY PANEL:   CBC  Recent Labs Lab 07/08/16 0509  WBC 6.8  HGB 7.3*  HCT 21.0*  PLT 178   ------------------------------------------------------------------------------------------------------------------ Chemistries   Recent Labs Lab 07/07/16 1546 07/08/16 0509  NA 137 138  K 4.1 3.6  CL 103 110  CO2 27 22  GLUCOSE 107* 93  BUN 30* 20  CREATININE 1.69* 1.14*  CALCIUM 8.8* 7.8*  AST 26  --   ALT 9*  --   ALKPHOS 66  --   BILITOT 1.4*  --    RADIOLOGY:  Dg Chest 2 View  Result Date: 07/07/2016 CLINICAL DATA:  60 year old female felt dizzy 2 days ago. Low blood pressure. Initial encounter. EXAM: CHEST  2 VIEW COMPARISON:  06/11/2014 chest x-ray. FINDINGS: Central pulmonary vascular prominence. No infiltrate, congestive heart failure or pneumothorax. No plain film evidence of pulmonary malignancy. Heart size within normal limits. Calcified aorta. No acute osseous abnormality. IMPRESSION: No active cardiopulmonary disease. Electronically Signed   By: Genia Del M.D.   On: 07/07/2016 17:17   Ct Head Wo Contrast  Result Date: 07/07/2016 CLINICAL DATA:  60 y/o  F; left leg numbness. EXAM: CT HEAD WITHOUT CONTRAST TECHNIQUE: Contiguous axial images were obtained from the base of the skull through the vertex without intravenous contrast. COMPARISON:  None. FINDINGS: Brain: No evidence of acute infarction, hemorrhage, hydrocephalus, extra-axial collection or mass lesion/mass effect. Vascular: Contrast opacification of dural venous sinuses and arteries from recent CT angiogram. Moderate calcific  atherosclerosis of cavernous internal carotid arteries. Skull: Normal. Negative for fracture or focal lesion. Sinuses/Orbits: No acute finding. Other: None.  IMPRESSION: No acute intracranial abnormality identified. If symptoms persist or if clinically indicated MRI is more sensitive for acute stroke. Unremarkable CT of head for age. Electronically Signed   By: Kristine Garbe M.D.   On: 07/07/2016 19:41   Ct Angio Chest/abd/pel For Dissection W And/or Wo Contrast  Result Date: 07/07/2016 CLINICAL DATA:  Dizziness with left foot and leg numbness low blood pressure EXAM: CT ANGIOGRAPHY CHEST, ABDOMEN AND PELVIS TECHNIQUE: Multidetector CT imaging through the chest, abdomen and pelvis was performed using the standard protocol during bolus administration of intravenous contrast. Multiplanar reconstructed images and MIPs were obtained and reviewed to evaluate the vascular anatomy. CONTRAST:  75 mL Isovue 370 intravenous COMPARISON:  chest x-ray 07/07/2016 FINDINGS: CTA CHEST FINDINGS Cardiovascular: The aorta demonstrates normal caliber and no evidence for aneurysm or dissection. No evidence for mediastinal hematoma. Mild coronary artery calcifications. Heart size normal. No pericardial effusion. Mediastinum/Nodes: No significantly enlarged mediastinal, hilar or axillary lymph nodes. Trachea and mainstem bronchi appear within normal limits. Thyroid gland is within normal limits. The esophagus is unremarkable. Lungs/Pleura: Mild emphysematous changes within the bilateral upper lobes. No acute infiltrate, pneumothorax, or pleural effusion. Musculoskeletal: No chest wall abnormality. No acute or significant osseous findings. Review of the MIP images confirms the above findings. CTA ABDOMEN AND PELVIS FINDINGS VASCULAR Aorta: Abdominal aorta is non aneurysmal. Irregular mural thrombus and atherosclerotic calcification is present throughout the abdominal aorta. There is no dissection seen. Celiac: Patent SMA: Patent Renals: Single right and left renal arteries which appear without high-grade stenosis. Minimal calcification left renal artery distal to its origin. IMA:  Stenosis at the IMA origin.  Normal distal enhancement. Inflow: Moderate diffuse disease of the infrarenal abdominal aorta. 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 Moderate diffuse disease of the left external iliac artery. There is mild calcification of the right iliac artery without high-grade stenosis. Mild diffuse disease of the right external iliac artery. Femoral arteries at the groin appear grossly patent. Veins: Limited evaluation due to arterial phase. Review of the MIP images confirms the above findings. NON-VASCULAR Hepatobiliary: No focal liver abnormality is seen. No gallstones, gallbladder wall thickening, or biliary dilatation. Pancreas: Unremarkable. No pancreatic ductal dilatation or surrounding inflammatory changes. Spleen: Normal in size without focal abnormality. Adrenals/Urinary Tract: Adrenal glands are unremarkable. Kidneys are normal, without renal calculi, focal lesion, or hydronephrosis. Bladder is unremarkable. Stomach/Bowel: Stomach is within normal limits. Appendix appears normal. No evidence of bowel wall thickening, distention, or inflammatory changes. Lymphatic: No significantly enlarged pelvic or abdominal lymph nodes. Reproductive: Uterus and bilateral adnexa are unremarkable. Prominent enhancing left greater than right pelvic vessels raises possibility for pelvic congestion. Other: No free air or free fluid Musculoskeletal: No acute or significant osseous findings. Review of the MIP images confirms the above findings. IMPRESSION: 1. No CT evidence for aortic dissection, aortic aneurysm or mediastinal hematoma. 2. 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 There is flow present within the  distal left common iliac artery prior to its bifurcation. There is diffuse disease of the left external iliac artery with patency of the imaged proximal left common femoral artery. 3. Mild emphysematous  disease within the upper lobes. 4. Prominent left greater than right parauterine enhancing vessels raises possibility of pelvic congestion syndrome. Electronically Signed   By: Donavan Foil M.D.   On: 07/07/2016 18:41   ASSESSMENT AND PLAN:   AKI (acute kidney injury) ([place]) - improved with IV fluids tonight, avoid nephrotoxins. Hold lisinopril.    Anemia due to acute or chronic blood loss, possible secondary to GIB,  Hemoccult positive. She has been taking NSAIDs recently. Need to rule out PUD. She has no history of anemia. Per GI consult, EGD, nothing by mouth and IV fluid support. Started Pepcid IV twice a day. Follow-up hemoglobin, PRBC transfusion when necessary.    Leg pain - vascular consult.    HTN (hypertension) - stable, hold lisinopril due to renal failure.   HLD (hyperlipidemia) - home meds   COPD (chronic obstructive pulmonary disease) ([place]) - continue home inhalers, stable. Tobacco abuse. Smoking cessation was counseled for 3 minutes, given nicotine patch.  All the records are reviewed and case discussed with Care Management/Social Worker. Management plans discussed with the patient, her daughter and they are in agreement.  CODE STATUS: Full code  TOTAL TIME TAKING CARE OF THIS PATIENT: 36 minutes.   More than 50% of the time was spent in counseling/coordination of care: YES  POSSIBLE D/C IN 1-2 DAYS, DEPENDING ON CLINICAL CONDITION.   Demetrios Loll M.D on 07/08/2016 at 2:19 PM  Between 7am to 6pm - Pager - [phone]  After 6pm go to [email or web address] - Proofreader  [facility]  Office  [phone]  CC: Primary care physician; Frazier Richards, MD  Note: This dictation was prepared with Dragon dictation along with smaller phrase  technology. Any transcriptional errors that result from this process are unintentional.

## 2016-07-08 NOTE — Consult Note (Signed)
GI Inpatient Consult Note  Reason for Consult: Anemia, heme + stool   Attending Requesting Consult: Dr. Jannifer Franklin  History of Present Illness: Tina Page is a 60 y.o. female with a known history of COPD, HTN, and HLD admitted with symptomatic anemia and heme + stool.  Patient presented to the [facility] ED complaining of dizziness, lightheadedness, weakness, and left leg numbness since Sunday night.  She was evaluated by her PCP earlier in the day, and patient recalls "having low blood pressure and begin told I was pale" in office.  She was encouraged to go to the ED for further evaluation.  Upon arrival to the ED, BP was 105/76 w/ HR 94.  Systoil BP has remained between 99991111, with diastolic dropping between 50-60.  ECG showed normal rate and rhythm w/ occasional PAC.  Labs revealed Hgb 8.3, Hct 24.0; last Hgb from 12/2014 was 15.3.  BMP showed BUN 30, Cr 1.69, and GFR 32.  Ferritin elevated at 515, with normal AST/ALT.  Iron studies, B12, and folate WNL. CTA chest/abd/pelvis was negative for acute GI findings; notably, mild left common iliac stenosis was noted.  CT head also unremarkable.  Stools guaiac positive in the ED.  Patient was admitted further further management, including IV fluids, and IV Pepcid 20mg  q 12 hours.  This morning, Hgb decreased to 7.3.  Review of patient's EMR indicates she was evaluated by Dawson Bills, NP at [facility], for heme + stool on 01/29/15.  She subsequently underwent an EGD and colonoscopy on 03/19/15.  EGD was notable for gastritis in gastric body and antrum, as well as flattened mucosa in the duodenum (pathology negative for celiac disease).  Colonoscopy demonstrated one hyperplastic polyp and internal hemorrhoids.  Today, patient denies GI complaints such as heartburn, acid reflux, epgiastric pain, or nausea/vomiting.  She denies recent changes to bowel habits, frank blood in stool, or melena.  Weight is stable. Appetite is good.  She takes at least Ibuprofen 2 tabs daily  for chronic knee pain.  She also smokes cigarettes - 1/2 ppd.  She denies significant EtoH use. No known h/o H pylori.  Also no new FHx of CCA, colon polyps, or other GI malignancy.   Past Medical History:  Past Medical History:  Diagnosis Date  . COPD (chronic obstructive pulmonary disease) ([place])   . HLD (hyperlipidemia)   . Hypertension     Problem List: Patient Active Problem List   Diagnosis Date Noted  . Anemia 07/07/2016  . Leg pain 07/07/2016  . HTN (hypertension) 07/07/2016  . HLD (hyperlipidemia) 07/07/2016  . COPD (chronic obstructive pulmonary disease) ([place]) 07/07/2016  . AKI (acute kidney injury) ([place]) 07/07/2016    Past Surgical History: Past Surgical History:  Procedure Laterality Date  . KNEE SURGERY      Allergies: Allergies  Allergen Reactions  . Codeine Anaphylaxis    Home Medications: Prescriptions Prior to Admission  Medication Sig Dispense Refill Last Dose  . albuterol (PROVENTIL HFA;VENTOLIN HFA) 108 (90 Base) MCG/ACT inhaler Inhale 2 puffs into the lungs as needed for wheezing.    PRN at PRN  . aspirin EC 81 MG tablet Take 81 mg by mouth daily.   07/07/2016 at 0730  . lisinopril (PRINIVIL,ZESTRIL) 5 MG tablet Take 5 mg by mouth daily.   07/07/2016 at am  . potassium chloride SA (K-DUR,KLOR-CON) 20 MEQ tablet Take 20 mEq by mouth daily.   07/07/2016 at am  . pravastatin (PRAVACHOL) 40 MG tablet Take 40 mg by mouth every  evening.   07/06/2016 at pm   Home medication reconciliation was completed with the patient.   Scheduled Inpatient Medications:   . famotidine (PEPCID) IV  20 mg Intravenous Q12H  . pravastatin  40 mg Oral QPM    Continuous Inpatient Infusions:   . sodium chloride 100 mL/hr at 07/08/16 0929    PRN Inpatient Medications:  acetaminophen **OR** acetaminophen, albuterol, ondansetron **OR** ondansetron (ZOFRAN) IV  Family History: family history includes Diabetes in her mother; Hypertension in her mother.    Social History:    reports that she has been smoking Cigarettes.  She has been smoking about 0.50 packs per day. She has never used smokeless tobacco. She reports that she does not drink alcohol or use drugs.   Review of Systems: Constitutional: Weight is stable.  Eyes: No changes in vision. ENT: No oral lesions, sore throat.  GI: see HPI.  Heme/Lymph: No easy bruising.  CV: No chest pain.  GU: No hematuria.  Integumentary: No rashes.  Neuro: No headaches.  Psych: No depression/anxiety.  Endocrine: No heat/cold intolerance.  Allergic/Immunologic: No urticaria.  Resp: No cough, SOB.  Musculoskeletal: No joint swelling.    Physical Examination: BP (!) 114/52 (BP Location: Left Arm)   Pulse 83   Temp 98.5 F (36.9 C) (Oral)   Resp 18   Ht 5\' 3"  (1.6 m)   Wt 64.9 kg (143 lb)   SpO2 97%   BMI 25.33 kg/m  Gen: NAD, alert and oriented x 4 HEENT: PEERLA, EOMI, Neck: supple, no JVD or thyromegaly Chest: CTA bilaterally, no wheezes, crackles, or other adventitious sounds CV: RRR, no m/g/c/r Abd: soft, NT, ND, +BS in all four quadrants; no HSM, guarding, ridigity, or rebound tenderness Ext: no edema, well perfused with 2+ pulses, Skin: no rash or lesions noted Lymph: no LAD  Data: Lab Results  Component Value Date   WBC 6.8 07/08/2016   HGB 7.3 (L) 07/08/2016   HCT 21.0 (L) 07/08/2016   MCV 97.4 07/08/2016   PLT 178 07/08/2016    Recent Labs Lab 07/07/16 1546 07/08/16 0509  HGB 8.3* 7.3*   Lab Results  Component Value Date   NA 138 07/08/2016   K 3.6 07/08/2016   CL 110 07/08/2016   CO2 22 07/08/2016   BUN 20 07/08/2016   CREATININE 1.14 (H) 07/08/2016   Lab Results  Component Value Date   ALT 9 (L) 07/07/2016   AST 26 07/07/2016   ALKPHOS 66 07/07/2016   BILITOT 1.4 (H) 07/07/2016   No results for input(s): APTT, INR, PTT in the last 168 hours.   Assessment/Plan: Tina Page is a 60 y.o. female with a known history of COPD, HTN, and HLD admitted with symptomatic anemia  and heme + stool.  Vitals remain stable.  Hgb was 8.3 upon arrival, decreasing to 7.3 this morning.  AKI noted on BMP, with elevations in BUN and Cr.   Given gastritis on EGD last year, daily Ibuprofen use, and cigarette smoking, I suspect patient has an ulcer or oozing gastritis causing an upper GI bleed.  I reviewed this patient with Dr. Vira Agar, and EGD is recommended.  Patient's colonoscopy last year, also for heme + stool, is reassuring; therefore, colonoscopy will only be recommended if EGD does not reveal the source of GI bleeding.  Recommendations: - Plan for EGD - patient currently NPO, further details regarding scheduling per Dr. Vira Agar - Continue IV Pepcid and IV fluids prior to EGD - Advised patient to avoid all  NSAIDs and EtOH upon discharge - Advised patient to discuss smoking cessation resources w/ PCP upon discharge - Monitor Hgb, transfuse if <7  Thank you for the consult. We will follow along with you. Please call with questions or concerns.  Lavera Guise, PA-C [facility] Phone: [phone] Pager: [phone]

## 2016-07-08 NOTE — Op Note (Signed)
[facility] Patient Name: Tina Page Procedure Date: 07/08/2016 4:16 PM MRN: [id number] Account #: [id number] Date of Birth: 1955/11/04 Admit Type: Inpatient Age: 60 Room: [facility] ENDO ROOM 4 Gender: Female Note Status: Finalized Procedure:            Upper GI endoscopy Indications:          Heme positive stool, Anemia Providers:            Manya Silvas, MD Complications:        No immediate complications. Procedure:            Pre-Anesthesia Assessment:                       - After reviewing the risks and benefits, the patient                        was deemed in satisfactory condition to undergo the                        procedure.                       After obtaining informed consent, the endoscope was                        passed under direct vision. Throughout the procedure,                        the patient's blood pressure, pulse, and oxygen                        saturations were monitored continuously. The Endoscope                        was introduced through the mouth, and advanced to the                        second part of duodenum. The upper GI endoscopy was                        accomplished without difficulty. The patient tolerated                        the procedure. Findings:      The examined esophagus was normal.      Dissolving liquid food with fluid was found in the gastric body. No       blood seen, no melena. Suctioned most of it out. A good view of stomach       was difficult. Especially proximal stomach.      Localized mildly scalloped mucosa was found in the duodenal bulb.      No blood, no melena, no ulcerations. Impression:           - Normal esophagus.                       - Dissolving liquid food. gastric fluid.                       - Scalloped mucosa was found in the duodenum,  consistent with celiac disease.                       - No specimens collected. Recommendation:        - The findings and recommendations were discussed with                        the patient. Do capsule endoscopy tomorrow. Consider                        work up for anemia. Manya Silvas, MD 07/08/2016 4:37:03 PM This report has been signed electronically. Number of Addenda: 0 Note Initiated On: 07/08/2016 4:16 PM      [facility]

## 2016-07-08 NOTE — Transfer of Care (Signed)
Immediate Anesthesia Transfer of Care Note  Patient: Tina Page  Procedure(s) Performed: Procedure(s): ESOPHAGOGASTRODUODENOSCOPY (EGD) WITH PROPOFOL (N/A)  Patient Location: PACU  Anesthesia Type:General  Level of Consciousness: sedated  Airway & Oxygen Therapy: Patient Spontanous Breathing and Patient connected to nasal cannula oxygen  Post-op Assessment: Report given to RN and Post -op Vital signs reviewed and stable  Post vital signs: Reviewed and stable  Last Vitals:  Vitals:   07/08/16 1603 07/08/16 1632  BP: (!) 138/59 (!) 145/100  Pulse: 72 89  Resp: 18 11  Temp:  (!) 35.6 C    Last Pain:  Vitals:   07/08/16 1632  TempSrc: Tympanic  PainSc:          Complications: No apparent anesthesia complications

## 2016-07-08 NOTE — Anesthesia Preprocedure Evaluation (Signed)
Anesthesia Evaluation  Patient identified by MRN, date of birth, ID band Patient awake    Reviewed: Allergy & Precautions, H&P , NPO status , Patient's Chart, lab work & pertinent test results  History of Anesthesia Complications Negative for: history of anesthetic complications  Airway Mallampati: III  TM Distance: <3 FB Neck ROM: limited    Dental  (+) Poor Dentition, Missing, Edentulous Upper, Edentulous Lower   Pulmonary shortness of breath and with exertion, COPD, Current Smoker,    Pulmonary exam normal breath sounds clear to auscultation       Cardiovascular Exercise Tolerance: Poor hypertension, (-) angina(-) Past MI negative cardio ROS Normal cardiovascular exam Rhythm:regular Rate:Normal     Neuro/Psych negative neurological ROS  negative psych ROS   GI/Hepatic negative GI ROS, Neg liver ROS, neg GERD  ,  Endo/Other  negative endocrine ROSneg diabetes  Renal/GU ESRFRenal diseasenegative Renal ROS  negative genitourinary   Musculoskeletal   Abdominal   Peds  Hematology negative hematology ROS (+)   Anesthesia Other Findings Past Medical History: No date: COPD (chronic obstructive pulmonary disease) (* No date: HLD (hyperlipidemia) No date: Hypertension  Past Surgical History: No date: KNEE SURGERY  BMI    Body Mass Index:  25.33 kg/m      Reproductive/Obstetrics negative OB ROS                             Anesthesia Physical Anesthesia Plan  ASA: III  Anesthesia Plan: General   Post-op Pain Management:    Induction:   Airway Management Planned:   Additional Equipment:   Intra-op Plan:   Post-operative Plan:   Informed Consent: I have reviewed the patients History and Physical, chart, labs and discussed the procedure including the risks, benefits and alternatives for the proposed anesthesia with the patient or authorized representative who has indicated  his/her understanding and acceptance.   Dental Advisory Given  Plan Discussed with: Anesthesiologist, CRNA and Surgeon  Anesthesia Plan Comments:         Anesthesia Quick Evaluation

## 2016-07-09 ENCOUNTER — Encounter: Payer: Self-pay | Admitting: Unknown Physician Specialty

## 2016-07-09 DIAGNOSIS — L930 Discoid lupus erythematosus: Secondary | ICD-10-CM

## 2016-07-09 DIAGNOSIS — N289 Disorder of kidney and ureter, unspecified: Secondary | ICD-10-CM

## 2016-07-09 DIAGNOSIS — F17210 Nicotine dependence, cigarettes, uncomplicated: Secondary | ICD-10-CM

## 2016-07-09 DIAGNOSIS — I959 Hypotension, unspecified: Secondary | ICD-10-CM

## 2016-07-09 DIAGNOSIS — D649 Anemia, unspecified: Secondary | ICD-10-CM

## 2016-07-09 DIAGNOSIS — I10 Essential (primary) hypertension: Secondary | ICD-10-CM

## 2016-07-09 DIAGNOSIS — J449 Chronic obstructive pulmonary disease, unspecified: Secondary | ICD-10-CM

## 2016-07-09 DIAGNOSIS — E785 Hyperlipidemia, unspecified: Secondary | ICD-10-CM

## 2016-07-09 LAB — CBC
HEMATOCRIT: 20.7 % — AB (ref 35.0–47.0)
Hemoglobin: 7 g/dL — ABNORMAL LOW (ref 12.0–16.0)
MCH: 34.1 pg — ABNORMAL HIGH (ref 26.0–34.0)
MCHC: 34.1 g/dL (ref 32.0–36.0)
MCV: 100 fL (ref 80.0–100.0)
PLATELETS: 159 10*3/uL (ref 150–440)
RBC: 2.07 MIL/uL — AB (ref 3.80–5.20)
RDW: 20.6 % — AB (ref 11.5–14.5)
WBC: 5.9 10*3/uL (ref 3.6–11.0)

## 2016-07-09 LAB — TRANSFERRIN: Transferrin: 160 mg/dL — ABNORMAL LOW (ref 192–382)

## 2016-07-09 LAB — IRON AND TIBC
IRON: 42 ug/dL (ref 28–170)
SATURATION RATIOS: 20 % (ref 10.4–31.8)
TIBC: 214 ug/dL — AB (ref 250–450)
UIBC: 172 ug/dL

## 2016-07-09 LAB — BASIC METABOLIC PANEL
ANION GAP: 5 (ref 5–15)
BUN: 13 mg/dL (ref 6–20)
CHLORIDE: 111 mmol/L (ref 101–111)
CO2: 23 mmol/L (ref 22–32)
Calcium: 8.7 mg/dL — ABNORMAL LOW (ref 8.9–10.3)
Creatinine, Ser: 0.97 mg/dL (ref 0.44–1.00)
GFR calc non Af Amer: >60 mL/min (ref >60)
Glucose, Bld: 80 mg/dL (ref 65–99)
POTASSIUM: 3.8 mmol/L (ref 3.5–5.1)
SODIUM: 139 mmol/L (ref 135–145)

## 2016-07-09 LAB — PREPARE RBC (CROSSMATCH)

## 2016-07-09 LAB — FERRITIN: Ferritin: 562 ng/mL — ABNORMAL HIGH (ref 11–307)

## 2016-07-09 LAB — HEMOGLOBIN AND HEMATOCRIT, BLOOD
HCT: 24.9 % — ABNORMAL LOW (ref 35.0–47.0)
HEMOGLOBIN: 8.7 g/dL — AB (ref 12.0–16.0)

## 2016-07-09 LAB — ABO/RH: ABO/RH(D): A NEG

## 2016-07-09 LAB — VITAMIN B12: Vitamin B-12: 231 pg/mL (ref 180–914)

## 2016-07-09 MED ORDER — SODIUM CHLORIDE 0.9 % IV SOLN
INTRAVENOUS | Status: DC
  Administered 2016-07-09 – 2016-07-11 (×4): via INTRAVENOUS

## 2016-07-09 MED ORDER — FUROSEMIDE 10 MG/ML IJ SOLN
20.0000 mg | Freq: Once | INTRAMUSCULAR | Status: AC
  Administered 2016-07-09: 20 mg via INTRAVENOUS
  Filled 2016-07-09: qty 2

## 2016-07-09 MED ORDER — CEFAZOLIN IN D5W 1 GM/50ML IV SOLN
1.0000 g | INTRAVENOUS | Status: AC
  Filled 2016-07-09: qty 50

## 2016-07-09 MED ORDER — SODIUM CHLORIDE 0.9 % IV SOLN
Freq: Once | INTRAVENOUS | Status: AC
  Administered 2016-07-09: 18:00:00 via INTRAVENOUS

## 2016-07-09 NOTE — Progress Notes (Signed)
[place] [facility] [place] NAME: Shalayah Nicotera    MR#:  [id number]  DATE OF BIRTH:  09-13-1955  SUBJECTIVE:  CHIEF COMPLAINT:   Chief Complaint  Patient presents with  . Hypotension   Weakness. Left leg pain. REVIEW OF SYSTEMS:  Review of Systems  Constitutional: Positive for malaise/fatigue. Negative for chills and fever.  Eyes: Negative for blurred vision and double vision.  Respiratory: Negative for cough, hemoptysis, shortness of breath, wheezing and stridor.   Cardiovascular: Negative for chest pain and leg swelling.  Gastrointestinal: Negative for abdominal pain, blood in stool, diarrhea, melena, nausea and vomiting.  Genitourinary: Negative for dysuria, hematuria and urgency.  Musculoskeletal: Negative for joint pain.       Left leg pain  Neurological: Positive for weakness. Negative for dizziness, focal weakness and loss of consciousness.  Psychiatric/Behavioral: Negative for depression. The patient is not nervous/anxious.     DRUG ALLERGIES:   Allergies  Allergen Reactions  . Codeine Anaphylaxis   VITALS:  Blood pressure (!) 155/62, pulse 78, temperature 98.6 F (37 C), temperature source Oral, resp. rate 18, height 5\' 3"  (1.6 m), weight 143 lb (64.9 kg), SpO2 98 %. PHYSICAL EXAMINATION:  Physical Exam  Constitutional: She is oriented to person, place, and time and well-developed, well-nourished, and in no distress.  HENT:  Head: Normocephalic.  Mouth/Throat: Oropharynx is clear and moist.  Eyes: Conjunctivae and EOM are normal.  Neck: Normal range of motion. Neck supple. No JVD present. No tracheal deviation present.  Cardiovascular: Normal rate, regular rhythm and normal heart sounds.  Exam reveals no gallop.   No murmur heard. Pulmonary/Chest: Effort normal and breath sounds normal. No respiratory distress. She has no wheezes. She has no rales.  Abdominal: She exhibits no distension. There is no tenderness.    Musculoskeletal: Normal range of motion. She exhibits no edema or tenderness.  Neurological: She is alert and oriented to person, place, and time. No cranial nerve deficit.  Skin: No rash noted. No erythema.  Psychiatric: Affect normal.   LABORATORY PANEL:   CBC  Recent Labs Lab 07/09/16 0431  WBC 5.9  HGB 7.0*  HCT 20.7*  PLT 159   ------------------------------------------------------------------------------------------------------------------ Chemistries   Recent Labs Lab 07/07/16 1546 07/08/16 0509  NA 137 138  K 4.1 3.6  CL 103 110  CO2 27 22  GLUCOSE 107* 93  BUN 30* 20  CREATININE 1.69* 1.14*  CALCIUM 8.8* 7.8*  AST 26  --   ALT 9*  --   ALKPHOS 66  --   BILITOT 1.4*  --    RADIOLOGY:  No results found. ASSESSMENT AND PLAN:   AKI (acute kidney injury) ([place]) - improved with IV fluids tonight, avoid nephrotoxins. Hold lisinopril.  Symptomatic nemia, not secondary to GIB,  Hemoccult is negative per Dr. Tiffany Kocher. No PUD, EGD done by Dr. Tiffany Kocher. Anemia workup does not show iron deficiency. Hb down to 7.0. She needs PRBC transfusion for vascular procedure. Follow-up hemoglobin after PRBC transfusion   Atherosclerotic occlusive disease bilateral lower extremities with rest pain of the left lower extremity. Per Dr. Delana Meyer, vascular consult, angiography with the hope for intervention on Friday, plan for aspirin as the primary antiplatelet therapy post procedure.    HTN (hypertension) - stable, hold lisinopril due to renal failure.   HLD (hyperlipidemia) - home meds   COPD (chronic obstructive pulmonary disease) ([place]) - continue home inhalers, stable. Tobacco abuse. Smoking cessation was counseled for 3 minutes, given  nicotine patch.  All the records are reviewed and case discussed with Care Management/Social Worker. Management plans discussed with the patient, her daughter and they are in agreement.  CODE STATUS: Full code  TOTAL TIME TAKING CARE OF THIS  PATIENT: 36 minutes.   More than 50% of the time was spent in counseling/coordination of care: YES  POSSIBLE D/C IN 1-2 DAYS, DEPENDING ON CLINICAL CONDITION.   Demetrios Loll M.D on 07/09/2016 at 12:58 PM  Between 7am to 6pm - Pager - [phone]  After 6pm go to [email or web address] - Proofreader  [facility]  Office  [phone]  CC: Primary care physician; Frazier Richards, MD  Note: This dictation was prepared with Dragon dictation along with smaller phrase technology. Any transcriptional errors that result from this process are unintentional.

## 2016-07-10 ENCOUNTER — Encounter: Payer: Self-pay | Admitting: *Deleted

## 2016-07-10 ENCOUNTER — Encounter
Admission: EM | Disposition: A | Discharge status: Home / Self Care | Payer: Self-pay | Source: Home / Self Care | Attending: Student in an Organized Health Care Education/Training Program

## 2016-07-10 DIAGNOSIS — I70223 Atherosclerosis of native arteries of extremities with rest pain, bilateral legs: Secondary | ICD-10-CM

## 2016-07-10 HISTORY — PX: PERIPHERAL VASCULAR CATHETERIZATION: SHX172C

## 2016-07-10 LAB — CBC
HEMATOCRIT: 24.3 % — AB (ref 35.0–47.0)
Hemoglobin: 8.4 g/dL — ABNORMAL LOW (ref 12.0–16.0)
MCH: 33.5 pg (ref 26.0–34.0)
MCHC: 34.4 g/dL (ref 32.0–36.0)
MCV: 97.4 fL (ref 80.0–100.0)
Platelets: 162 10*3/uL (ref 150–440)
RBC: 2.5 MIL/uL — ABNORMAL LOW (ref 3.80–5.20)
RDW: 19.6 % — AB (ref 11.5–14.5)
WBC: 6 10*3/uL (ref 3.6–11.0)

## 2016-07-10 LAB — RETICULOCYTES
RBC.: 2.5 MIL/uL — ABNORMAL LOW (ref 3.80–5.20)
Retic Count, Absolute: 282.5 10*3/uL — ABNORMAL HIGH (ref 19.0–183.0)
Retic Ct Pct: 11.3 % — ABNORMAL HIGH (ref 0.4–3.1)

## 2016-07-10 LAB — SURGICAL PCR SCREEN
MRSA, PCR: NEGATIVE
Staphylococcus aureus: NEGATIVE

## 2016-07-10 SURGERY — LOWER EXTREMITY ANGIOGRAPHY
Anesthesia: Moderate Sedation

## 2016-07-10 MED ORDER — MIDAZOLAM HCL 2 MG/2ML IJ SOLN
INTRAMUSCULAR | Status: DC | PRN
  Administered 2016-07-10: 1 mg via INTRAVENOUS
  Administered 2016-07-10: 2 mg via INTRAVENOUS
  Administered 2016-07-10: 1 mg via INTRAVENOUS

## 2016-07-10 MED ORDER — ASPIRIN EC 81 MG PO TBEC
81.0000 mg | DELAYED_RELEASE_TABLET | Freq: Every day | ORAL | Status: DC
  Administered 2016-07-10 – 2016-07-12 (×3): 81 mg via ORAL
  Filled 2016-07-10 (×3): qty 1

## 2016-07-10 MED ORDER — IOPAMIDOL (ISOVUE-300) INJECTION 61%
INTRAVENOUS | Status: DC | PRN
  Administered 2016-07-10: 110 mL via INTRA_ARTERIAL

## 2016-07-10 MED ORDER — FENTANYL CITRATE (PF) 100 MCG/2ML IJ SOLN
INTRAMUSCULAR | Status: AC
  Filled 2016-07-10: qty 4

## 2016-07-10 MED ORDER — TRAMADOL HCL 50 MG PO TABS
50.0000 mg | ORAL_TABLET | Freq: Four times a day (QID) | ORAL | Status: DC | PRN
  Administered 2016-07-10 – 2016-07-11 (×3): 50 mg via ORAL
  Filled 2016-07-10 (×3): qty 1

## 2016-07-10 MED ORDER — MIDAZOLAM HCL 5 MG/5ML IJ SOLN
INTRAMUSCULAR | Status: AC
  Filled 2016-07-10: qty 5

## 2016-07-10 MED ORDER — FENTANYL CITRATE (PF) 100 MCG/2ML IJ SOLN
INTRAMUSCULAR | Status: DC | PRN
  Administered 2016-07-10 (×3): 50 ug via INTRAVENOUS

## 2016-07-10 MED ORDER — CLOPIDOGREL BISULFATE 75 MG PO TABS
150.0000 mg | ORAL_TABLET | Freq: Once | ORAL | Status: AC
  Administered 2016-07-10: 150 mg via ORAL
  Filled 2016-07-10: qty 2

## 2016-07-10 MED ORDER — CLOPIDOGREL BISULFATE 75 MG PO TABS
75.0000 mg | ORAL_TABLET | Freq: Every day | ORAL | Status: DC
  Administered 2016-07-11 – 2016-07-12 (×2): 75 mg via ORAL
  Filled 2016-07-10 (×2): qty 1

## 2016-07-10 MED ORDER — HEPARIN SODIUM (PORCINE) 1000 UNIT/ML IJ SOLN
INTRAMUSCULAR | Status: DC | PRN
  Administered 2016-07-10: 4000 [IU] via INTRAVENOUS

## 2016-07-10 MED ORDER — LIDOCAINE HCL (PF) 1 % IJ SOLN
INTRAMUSCULAR | Status: AC
  Filled 2016-07-10: qty 30

## 2016-07-10 MED ORDER — HEPARIN SODIUM (PORCINE) 1000 UNIT/ML IJ SOLN
INTRAMUSCULAR | Status: AC
  Filled 2016-07-10: qty 1

## 2016-07-10 SURGICAL SUPPLY — 17 items
BALLN LUTONIX DCB 6X60X130 (BALLOONS) ×3
BALLOON LUTONIX DCB 6X60X130 (BALLOONS) ×2 IMPLANT
CATH PIG 70CM (CATHETERS) ×3 IMPLANT
CATH TORCON 5FR 0.38 (CATHETERS) ×3 IMPLANT
DEVICE PRESTO INFLATION (MISCELLANEOUS) ×6 IMPLANT
DEVICE STARCLOSE SE CLOSURE (Vascular Products) ×6 IMPLANT
GLIDEWIRE STIFF .35X180X3 HYDR (WIRE) ×3 IMPLANT
PACK ANGIOGRAPHY (CUSTOM PROCEDURE TRAY) ×3 IMPLANT
SET INTRO CAPELLA COAXIAL (SET/KITS/TRAYS/PACK) ×3 IMPLANT
SHEATH BRITE TIP 5FRX11 (SHEATH) ×3 IMPLANT
SHEATH BRITE TIP 7FRX11 (SHEATH) ×6 IMPLANT
STENT LIFESTREAM 8X37X80 (Permanent Stent) ×3 IMPLANT
STENT LIFESTREAM 8X58X80 (Permanent Stent) ×3 IMPLANT
SYR MEDRAD MARK V 150ML (SYRINGE) ×3 IMPLANT
TUBING CONTRAST HIGH PRESS 72 (TUBING) ×3 IMPLANT
WIRE J 3MM .035X145CM (WIRE) ×3 IMPLANT
WIRE MAGIC TOR.035 180C (WIRE) ×6 IMPLANT

## 2016-07-10 NOTE — Op Note (Signed)
[facility]  Percutaneous Study/Intervention Procedural Note   Date of Surgery: 07/10/2016  Surgeon:Schnier, Dolores Lory   Pre-operative Diagnosis: Left lower extremity ischemia; atherosclerotic occlusive disease bilateral lower extremities with rest pain left lower extremity  Post-operative diagnosis:  Same  Procedure(s) Performed:  1.  Abdominal aortogram  2.  Bilateral distal runoff  3.  Percutaneous transluminal angioplasty and stent placement right common iliac artery; "kissing balloon" technique 8 x 36 Lifestream stent  4.  Percutaneous transluminal and plasty and stent placement left common iliac artery; "kissing balloon" technique 8 x 59 Lifestream stent             5.  Percutaneous transluminal angioplasty of the left external iliac artery with a 6 x 60 Lutonix  6.  Ultrasound guided access bilateral common femoral arteries  7.  StarClose closure device bilateral common femoral arteries  Anesthesia: Conscious sedation was administered under my direct supervision. IV Versed plus fentanyl were utilized. Continuous ECG, pulse oximetry and blood pressure was monitored throughout the entire procedure. Conscious sedation was for a total of 75 minutes.  Sheath: 7 French sheath right common femoral artery retrograde; 7 French sheath left common femoral artery retrograde  Contrast: 110 cc  Fluoroscopy Time: 4.3 minutes  Indications:  Patient presented to the emergency room with acute onset of left leg numbness and pain. She was found to have absent pulses. CT demonstrated occlusion of the left common iliac. The risks and benefits for angiography intervention were reviewed all questions answered patient agreed to proceed  Procedure:  Tina Page a 60 y.o. female who was identified and appropriate procedural time out was performed.  The patient was then placed supine on the table and prepped and draped in the usual sterile fashion.  Ultrasound was used to  evaluate the right common femoral artery.  It was patent .  A digital ultrasound image was acquired.  A Seldinger needle was used to access the right common femoral artery under direct ultrasound guidance and a permanent image was performed.  A 0.035 J wire was advanced without resistance and a 5Fr sheath was placed.    The pigtail catheter was then positioned at the level of T12 and an AP image of the aorta was obtained. After review the images the pigtail catheter was repositioned above the aortic bifurcation and bilateral oblique views of the pelvis were obtained. Subsequently the detector was returned to the AP position and bilateral lower extremity runoff was obtained.  After review the images the ultrasound was reprepped and delivered back onto the sterile field. The left common femoral was then imaged with the ultrasound it was noted to be echolucent and pulsatile indicating patency. Images recorded for the permanent record. Under real-time visualization a microneedle was inserted into the anterior wall the common femoral artery microwire was then advanced without difficulty under fluoroscopic guidance followed by placement of the micro-sheath.  A stiff angled glide wire and Kumpe catheter was then negotiated under fluoroscopic guidance into the aorta. 7 French sheath was then placed.  4000 Units of heparin was given and allowed to circulate for proximally 4 minutes.  The right sheath was then upsized to a 7 [place] sheath as well after a 180 Magic torque wire was advanced through the pigtail catheter. Magnified images of the aortic bifurcation were then made using hand injection contrast from the femoral sheaths. After appropriate sizing a 8 x 36 stent was selected for the right and a 8 x 59 stent  was selected for the left. There were then advanced and positioned just above the aortic bifurcation. Insufflation for full expansion of the stents was performed simultaneously. Follow-up imaging was then  performed and narrowing was noted in the proximal external iliac extending to the iliac bifurcation and a 6 x 60 Lutonix balloon was advanced up the left side.  Angioplasty was performed to 10 atm for 2 minutes. Follow-up imaging with hand injection through the sheath demonstrated excellent result with less than 5% residual stenosis  The pigtail catheter was then introduced up the right and bolus injection of contrast was used to perform final imaging of the distal aortic reconstruction.  Oblique views were then obtained of the groins in succession and Star close device is deployed without difficulty. There were no immediate complications   Findings:   Aortogram:  The abdominal aorta is opacified with a bolus injection contrast. Demonstrates diffuse disease but there are no hemodynamically significant lesions noted until the distal aortic bifurcation where bilateral ostial iliac lesions are identified with moderate stenosis on the right and occlusion on the left. There is moderate poststenotic dilatation noted of the common iliac arteries as well.  Right Lower Extremity:  The right common femoral, profunda femoris, superficial femoral and popliteal arteries widely patent. Trifurcation is patent.  Left Lower Extremity:  The left common femoral, profunda femoris, superficial femoral and popliteal arteries are widely patent. Trifurcation is patent.  Following placement of the iliac stents there is now wide patency with less than 10% residual stenosis with rapid flow through the aortic bifurcation bilaterally.  Summary:  Successful reconstruction of the distal aorta and bilateral iliac arteries  Disposition: Patient was taken to the recovery room in stable condition having tolerated the procedure well.  Belenda Cruise Schnier 07/10/2016,5:35 PM

## 2016-07-10 NOTE — Progress Notes (Signed)
[place] at [place] NAME: Cynthina Penado    MR#:  [id number]  DATE OF BIRTH:  1956-03-31  SUBJECTIVE:  CHIEF COMPLAINT:   Chief Complaint  Patient presents with  . Hypotension   Weakness. Left leg pain. She feels better. REVIEW OF SYSTEMS:  Review of Systems  Constitutional: Positive for malaise/fatigue. Negative for chills and fever.  Eyes: Negative for blurred vision and double vision.  Respiratory: Negative for cough, hemoptysis, shortness of breath, wheezing and stridor.   Cardiovascular: Negative for chest pain and leg swelling.  Gastrointestinal: Negative for abdominal pain, blood in stool, diarrhea, melena, nausea and vomiting.  Genitourinary: Negative for dysuria, hematuria and urgency.  Musculoskeletal: Negative for joint pain.       Left leg pain  Neurological: Positive for weakness. Negative for dizziness, focal weakness and loss of consciousness.  Psychiatric/Behavioral: Negative for depression. The patient is not nervous/anxious.     DRUG ALLERGIES:   Allergies  Allergen Reactions  . Codeine Anaphylaxis   VITALS:  Blood pressure (!) 162/70, pulse 76, temperature 99 F (37.2 C), temperature source Oral, resp. rate 20, height 5\' 3"  (1.6 m), weight 143 lb (64.9 kg), SpO2 99 %. PHYSICAL EXAMINATION:  Physical Exam  Constitutional: She is oriented to person, place, and time and well-developed, well-nourished, and in no distress.  HENT:  Head: Normocephalic.  Mouth/Throat: Oropharynx is clear and moist.  Eyes: Conjunctivae and EOM are normal.  Neck: Normal range of motion. Neck supple. No JVD present. No tracheal deviation present.  Cardiovascular: Normal rate, regular rhythm and normal heart sounds.  Exam reveals no gallop.   No murmur heard. Pulmonary/Chest: Effort normal and breath sounds normal. No respiratory distress. She has no wheezes. She has no rales.  Abdominal: She exhibits no distension. There is no  tenderness.  Musculoskeletal: Normal range of motion. She exhibits no edema or tenderness.  Neurological: She is alert and oriented to person, place, and time. No cranial nerve deficit.  Skin: No rash noted. No erythema.  Psychiatric: Affect normal.   LABORATORY PANEL:   CBC  Recent Labs Lab 07/10/16 0506  WBC 6.0  HGB 8.4*  HCT 24.3*  PLT 162   ------------------------------------------------------------------------------------------------------------------ Chemistries   Recent Labs Lab 07/07/16 1546  07/09/16 1322  NA 137  < > 139  K 4.1  < > 3.8  CL 103  < > 111  CO2 27  < > 23  GLUCOSE 107*  < > 80  BUN 30*  < > 13  CREATININE 1.69*  < > 0.97  CALCIUM 8.8*  < > 8.7*  AST 26  --   --   ALT 9*  --   --   ALKPHOS 66  --   --   BILITOT 1.4*  --   --   < > = values in this interval not displayed. RADIOLOGY:  No results found. ASSESSMENT AND PLAN:   AKI (acute kidney injury) ([place]) - improved with IV fluids tonight, avoid nephrotoxins. Hold lisinopril.  Symptomatic nemia, not secondary to GIB,  Hemoccult is negative per Dr. Tiffany Kocher. No PUD, EGD done by Dr. Tiffany Kocher. Anemia workup does not show iron deficiency. Hb down to 7.0. S/p 1 units PRBC transfusion for vascular procedure. Hb up to 8.4 today. Follow-up hemoglobin in am.  Atherosclerotic occlusive disease bilateral lower extremities with rest pain of the left lower extremity. Per Dr. Delana Meyer, vascular consult, angiography with the hope for intervention today, plan for aspirin  as the primary antiplatelet therapy post procedure.    HTN (hypertension) - stable, hold lisinopril due to renal failure.   HLD (hyperlipidemia) - home meds   COPD (chronic obstructive pulmonary disease) ([place]) - continue home inhalers, stable. Tobacco abuse. Smoking cessation was counseled for 3 minutes, given nicotine patch.  All the records are reviewed and case discussed with Care Management/Social Worker. Management plans discussed with  the patient, her daughter and they are in agreement.  CODE STATUS: Full code  TOTAL TIME TAKING CARE OF THIS PATIENT: 32 minutes.   More than 50% of the time was spent in counseling/coordination of care: YES  POSSIBLE D/C IN 1 DAYS, DEPENDING ON CLINICAL CONDITION.   Demetrios Loll M.D on 07/10/2016 at 3:51 PM  Between 7am to 6pm - Pager - [phone]  After 6pm go to [email or web address] - Proofreader  [facility]  Office  [phone]  CC: Primary care physician; Frazier Richards, MD  Note: This dictation was prepared with Dragon dictation along with smaller phrase technology. Any transcriptional errors that result from this process are unintentional.

## 2016-07-10 NOTE — Progress Notes (Signed)
Verified with Dr. Delana Meyer that it was okay for patient to receive aspirin and plavix prior to procedure.

## 2016-07-10 NOTE — Consult Note (Signed)
[facility]  Date of admission:  07/07/2016  Inpatient day:  07/09/2016  Consulting physician: Dr. Demetrios Loll   Reason for Consultation:  Anemia  Chief Complaint: Tina Page is a 60 y.o. 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  HPI:  The patient states that she was in her usual state of good health until 07/02/2016 when she felt "crappy". She describes feeling like she was "coming down with something".  She noted feeling sluggish. She denied any fevers chills or sweats.  She denied any runny nose , sore throat, cough or congestion. She had no rash.  On Sunday, 07/05/2016, she states that she stated that the bottom of her left foot was hurting.  She felt dizzy and nauseated. She had some diarrhea. She denied any melena or hematochezia.  She notes that since the spring, she had to stop periodically when walking due to pain and cramping in her legs.  She denies any new medications or herbal products. She notes a history of hepatitis as a teenager.  She denies any fevers, sweats or weight loss.  CBC on 06/11/2014 revealed a hematocrit of 46.5, hemoglobin 15.4, MCV 92, platelets 127,000 and white count 6100.  CBC on admission included a hematocrit of 24.0 hemoglobin 8.3..  CBC on 07/08/2016 revealed a hematocrit of 21 and hemoglobin of 7.3. CBC today includes a hematocrit of 20.7 and hemoglobin of 7.0.  Initial testing revealed a guaiac positive stool.  She has been seen by gastroenterology.  She had undergone EGD and colonoscopy at [facility] on 03/19/2015.  EGD revealed gastritis in the body and antrum. There was flattened mucosa in the duodenum. Pathology was negative for celiac disease. Colonoscopy revealed one hyperplastic polyp and internal hemorrhoids.  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 There was flow present within the distal left common iliac artery prior to its bifurcation. There was diffuse disease of the left external iliac artery with patency of the imaged proximal left common femoral artery.  There was no adenopathy.  Spleen was normal.  She underwent EGD on 07/08/2016 by Dr. Gustavo Lah. Esophagus was normal. There was scalloped mucosa consistent with celiac disease. Plan was for capsule endoscopy.  She has undergone an anemia workup.  Ferritin was 562.  Iron studies revealed a saturation of 20% and a TIBC of 214 (low).  B12 was 231 (low normal).  Folate was 42.  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   Past Medical History:  Diagnosis Date  . COPD (chronic obstructive pulmonary disease) ([place])   . HLD (hyperlipidemia)   . Hypertension     Past Surgical History:  Procedure Laterality Date  . ESOPHAGOGASTRODUODENOSCOPY (EGD) WITH PROPOFOL N/A 07/08/2016   Procedure: ESOPHAGOGASTRODUODENOSCOPY (EGD) WITH PROPOFOL;  Surgeon: Manya Silvas, MD;  Location: [facility] ENDOSCOPY;  Service: Endoscopy;  Laterality: N/A;  . KNEE SURGERY      Family History  Problem Relation Age of Onset  . Diabetes Mother   . Hypertension Mother     Social History:  reports that she has been smoking Cigarettes.  She has been smoking about 0.50 packs per day. She has never used smokeless tobacco. She reports that she does not drink alcohol or use drugs.  The patient works at [employer]  Her daughter notes that she is  exposed to col temperatures.  She is accompanied by her daughter, [name], today.  Allergies:  Allergies  Allergen Reactions  . Codeine Anaphylaxis    Medications Prior to Admission  Medication Sig Dispense Refill  . albuterol (PROVENTIL HFA;VENTOLIN HFA) 108 (90 Base) MCG/ACT inhaler Inhale 2 puffs into the lungs as needed for wheezing.     [name] Kitchen aspirin EC 81 MG tablet Take 81 mg  by mouth daily.    [name] Kitchen lisinopril (PRINIVIL,ZESTRIL) 5 MG tablet Take 5 mg by mouth daily.    . potassium chloride SA (K-DUR,KLOR-CON) 20 MEQ tablet Take 20 mEq by mouth daily.    . pravastatin (PRAVACHOL) 40 MG tablet Take 40 mg by mouth every evening.      Review of Systems: GENERAL:  Fatigue.  No fevers, sweats or weight loss. PERFORMANCE STATUS (ECOG):  1 HEENT:  No visual changes, runny nose, sore throat, mouth sores or tenderness. Lungs: No shortness of breath or cough.  No hemoptysis. Cardiac:  No chest pain, palpitations, orthopnea, or PND. GI:  Diarrhea prior to admission.  No nausea, vomiting, constipation, melena or hematochezia. GU:  No urgency, frequency, dysuria, or hematuria. Musculoskeletal:  No back pain.  No joint pain.  No muscle tenderness. Extremities:  Bottom of left foot numb.  No swelling. Skin:  Discoid lupus.  No rashes or skin changes. Neuro:  Hands numb.  No headache, weakness, balance or coordination issues. Endocrine:  No diabetes, thyroid issues, hot flashes or night sweats. Psych:  No mood changes, depression or anxiety. Pain:  No focal pain. Review of systems:  All other systems reviewed and found to be negative.  Physical Exam:  Blood pressure 137/66, pulse 90, temperature 99.2 F (37.3 C), temperature source Oral, resp. rate 20, height _0  (1.6 m), weight 143 lb (64.9 kg), SpO2 100 %.  GENERAL:  Well developed, well nourished, pale woman sitting comfortably on the medical unit in no acute distress. MENTAL STATUS:  Alert and oriented to person, place and time. HEAD:  [name] hair.  Normocephalic, atraumatic, face symmetric, no Cushingoid features. EYES:  Pupils equal round and reactive to light and accomodation.  No conjunctivitis or scleral icterus. ENT:  Oropharynx clear without lesion.  Tongue normal. Mucous membranes moist.  RESPIRATORY:  Clear to auscultation without rales, wheezes or rhonchi. CARDIOVASCULAR:  Regular rate and rhythm without murmur,  rub or gallop. ABDOMEN:  Soft, non-tender, with active bowel sounds, and no hepatosplenomegaly.  No masses. SKIN:  No rashes, ulcers or lesions. EXTREMITIES: No edema, no skin discoloration or tenderness.  No palpable cords. LYMPH NODES: No palpable cervical, supraclavicular, axillary or inguinal adenopathy  NEUROLOGICAL: Unremarkable. PSYCH:  Appropriate.   Results for orders placed or performed during the hospital encounter of 07/07/16 (from the past 48 hour(s))  Basic metabolic panel     Status: Abnormal   Collection Time: 07/08/16  5:09 AM  Result Value Ref Range   Sodium 138 135 - 145 mmol/L   Potassium 3.6 3.5 - 5.1 mmol/L   Chloride 110 101 - 111 mmol/L   CO2 22 22 - 32 mmol/L   Glucose, Bld 93 65 - 99 mg/dL   BUN 20 6 - 20 mg/dL   Creatinine, Ser 1.14 (H) 0.44 - 1.00 mg/dL   Calcium 7.8 (L) 8.9 - 10.3 mg/dL   GFR calc non Af Amer 52 (L) >60 mL/min   GFR calc Af Amer >60 >60 mL/min    Comment: (NOTE) The eGFR has been calculated using the CKD  EPI equation. This calculation has not been validated in all clinical situations. eGFR's persistently <60 mL/min signify possible Chronic Kidney Disease.    Anion gap 6 5 - 15  CBC     Status: Abnormal   Collection Time: 07/08/16  5:09 AM  Result Value Ref Range   WBC 6.8 3.6 - 11.0 K/uL   RBC 2.16 (L) 3.80 - 5.20 MIL/uL   Hemoglobin 7.3 (L) 12.0 - 16.0 g/dL   HCT 21.0 (L) 35.0 - 47.0 %   MCV 97.4 80.0 - 100.0 fL   MCH 33.8 26.0 - 34.0 pg   MCHC 34.7 32.0 - 36.0 g/dL   RDW 20.2 (H) 11.5 - 14.5 %   Platelets 178 150 - 440 K/uL  CBC     Status: Abnormal   Collection Time: 07/09/16  4:31 AM  Result Value Ref Range   WBC 5.9 3.6 - 11.0 K/uL   RBC 2.07 (L) 3.80 - 5.20 MIL/uL   Hemoglobin 7.0 (L) 12.0 - 16.0 g/dL   HCT 20.7 (L) 35.0 - 47.0 %   MCV 100.0 80.0 - 100.0 fL   MCH 34.1 (H) 26.0 - 34.0 pg   MCHC 34.1 32.0 - 36.0 g/dL   RDW 20.6 (H) 11.5 - 14.5 %   Platelets 159 150 - 440 K/uL  Ferritin     Status: Abnormal    Collection Time: 07/09/16  4:31 AM  Result Value Ref Range   Ferritin 562 (H) 11 - 307 ng/mL  Iron and TIBC     Status: Abnormal   Collection Time: 07/09/16  4:31 AM  Result Value Ref Range   Iron 42 28 - 170 ug/dL   TIBC 214 (L) 250 - 450 ug/dL   Saturation Ratios 20 10.4 - 31.8 %   UIBC 172 ug/dL  Vitamin B12     Status: None   Collection Time: 07/09/16  4:31 AM  Result Value Ref Range   Vitamin B-12 231 180 - 914 pg/mL    Comment: (NOTE) This assay is not validated for testing neonatal or myeloproliferative syndrome specimens for Vitamin B12 levels. Performed at [facility]   Transferrin     Status: Abnormal   Collection Time: 07/09/16  4:31 AM  Result Value Ref Range   Transferrin 160 (L) 192 - 382 mg/dL    Comment: Performed at [facility]  ABO/Rh     Status: None   Collection Time: 07/09/16  4:31 AM  Result Value Ref Range   ABO/RH(D) A NEG   Prepare RBC     Status: None   Collection Time: 07/09/16  1:22 PM  Result Value Ref Range   Order Confirmation ORDER PROCESSED BY BLOOD BANK   Basic metabolic panel     Status: Abnormal   Collection Time: 07/09/16  1:22 PM  Result Value Ref Range   Sodium 139 135 - 145 mmol/L   Potassium 3.8 3.5 - 5.1 mmol/L   Chloride 111 101 - 111 mmol/L   CO2 23 22 - 32 mmol/L   Glucose, Bld 80 65 - 99 mg/dL   BUN 13 6 - 20 mg/dL   Creatinine, Ser 0.97 0.44 - 1.00 mg/dL   Calcium 8.7 (L) 8.9 - 10.3 mg/dL   GFR calc non Af Amer >60 >60 mL/min   GFR calc Af Amer >60 >60 mL/min    Comment: (NOTE) The eGFR has been calculated using the CKD EPI equation. This calculation has not been validated in all clinical situations.  eGFR's persistently <60 mL/min signify possible Chronic Kidney Disease.    Anion gap 5 5 - 15  Hemoglobin and hematocrit, blood     Status: Abnormal   Collection Time: 07/09/16  9:34 PM  Result Value Ref Range   Hemoglobin 8.7 (L) 12.0 - 16.0 g/dL   HCT 24.9 (L) 35.0 - 47.0 %   No results  found.  Assessment:  The patient is a 60 y.o. 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  One week prior to admission, she felt like she was coming down with something.  She denied any fever, runny nose, sore throat or cough.  She had some diarrhea.  She denied any new medications or herbal products.  Anemia workup revealed a ferritin 562, iron saturation of 20% and a TIBC of 214 (low).  B12 was 231 (low normal).  Folate was 42.  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  Peripheral smear reveals rouleaux formation.  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 There was no adenopathy.  Spleen was normal.  EGD and colonoscopy on 03/19/2015 revealed gastritis in the body and antrum. Colonoscopy revealed one hyperplastic polyp.  EGD on 07/08/2016 was normal.  Plan:   1.  Hematology:  Workup reveals no evidence of a GI bleeding. Labs confirmed a cold auto antibody. The typical etiologies include infection (mycoplasmal pneumonia, EBV, CMV), autoimmune disease, and malignancy (lymphoma). She has a history of discoid lupus. Imaging studies reveal no evidence of adenopathy. She denies any history of Raynaud's disease. Discuss laboratory work-up.  Check an MMA given her low normal B12 to rule out B12 deficiency.  Discuss warming all fluids and blood products.  Discuss keeping her room warm. Typically steroids or not helpful with a cold autoantibody. If she has significant ongoing hemolysis, she would be treated with the Rituxan.  Will check hepatitis serologies.   Thank you for allowing me to participate in Tina Page 's care.  I will follow her closely with you while hospitalized and after discharge in the outpatient  department.  Lequita Asal, MD  07/09/2016

## 2016-07-11 DIAGNOSIS — L930 Discoid lupus erythematosus: Secondary | ICD-10-CM | POA: Diagnosis not present

## 2016-07-11 DIAGNOSIS — J00 Acute nasopharyngitis [common cold]: Secondary | ICD-10-CM | POA: Diagnosis not present

## 2016-07-11 DIAGNOSIS — I70223 Atherosclerosis of native arteries of extremities with rest pain, bilateral legs: Secondary | ICD-10-CM

## 2016-07-11 DIAGNOSIS — D649 Anemia, unspecified: Secondary | ICD-10-CM | POA: Diagnosis not present

## 2016-07-11 DIAGNOSIS — N289 Disorder of kidney and ureter, unspecified: Secondary | ICD-10-CM | POA: Diagnosis not present

## 2016-07-11 LAB — CBC WITH DIFFERENTIAL/PLATELET
Basophils Absolute: 0.1 10*3/uL (ref 0–0.1)
Basophils Relative: 1 %
Eosinophils Absolute: 0.1 10*3/uL (ref 0–0.7)
Eosinophils Relative: 2 %
HCT: 23.3 % — ABNORMAL LOW (ref 35.0–47.0)
Hemoglobin: 8.1 g/dL — ABNORMAL LOW (ref 12.0–16.0)
Lymphocytes Relative: 19 %
Lymphs Abs: 1 10*3/uL (ref 1.0–3.6)
MCH: 33.7 pg (ref 26.0–34.0)
MCHC: 34.7 g/dL (ref 32.0–36.0)
MCV: 97.2 fL (ref 80.0–100.0)
Monocytes Absolute: 0.4 10*3/uL (ref 0.2–0.9)
Monocytes Relative: 7 %
Neutro Abs: 3.7 10*3/uL (ref 1.4–6.5)
Neutrophils Relative %: 71 %
Platelets: 152 10*3/uL (ref 150–440)
RBC: 2.4 MIL/uL — ABNORMAL LOW (ref 3.80–5.20)
RDW: 18.9 % — ABNORMAL HIGH (ref 11.5–14.5)
WBC: 5.1 10*3/uL (ref 3.6–11.0)

## 2016-07-11 LAB — CMV IGM: CMV IgM: 62 AU/mL — ABNORMAL HIGH (ref 0.0–29.9)

## 2016-07-11 LAB — EBV AB TO VIRAL CAPSID AG PNL, IGG+IGM
EBV VCA IgG: >600 U/mL — ABNORMAL HIGH (ref 0.0–17.9)
EBV VCA IgM: 54.1 U/mL — ABNORMAL HIGH (ref 0.0–35.9)

## 2016-07-11 LAB — TYPE AND SCREEN
ABO/RH(D): A NEG
ABO/RH(D): A NEG
Antibody Screen: POSITIVE
Antibody Screen: POSITIVE
DAT, COMPLEMENT: POSITIVE
DAT, IgG: POSITIVE
DAT, IgG: POSITIVE
DAT, complement: POSITIVE
UNIT DIVISION: 0
Unit division: 0

## 2016-07-11 LAB — HEPATITIS C ANTIBODY: HCV Ab: 5 {s_co_ratio} — ABNORMAL HIGH (ref 0.0–0.9)

## 2016-07-11 LAB — HEPATITIS B SURFACE ANTIGEN: Hepatitis B Surface Ag: NEGATIVE

## 2016-07-11 LAB — C4 COMPLEMENT: Complement C4, Body Fluid: 15 mg/dL (ref 14–44)

## 2016-07-11 LAB — C3 COMPLEMENT: C3 Complement: 92 mg/dL (ref 82–167)

## 2016-07-11 LAB — HEPATITIS B CORE ANTIBODY, TOTAL: Hep B Core Total Ab: POSITIVE — AB

## 2016-07-11 MED ORDER — DOCUSATE SODIUM 100 MG PO CAPS
100.0000 mg | ORAL_CAPSULE | Freq: Two times a day (BID) | ORAL | Status: DC
Start: 2016-07-11 — End: 2016-07-12
  Administered 2016-07-11 – 2016-07-12 (×3): 100 mg via ORAL
  Filled 2016-07-11 (×3): qty 1

## 2016-07-11 MED ORDER — SENNA 8.6 MG PO TABS
1.0000 | ORAL_TABLET | Freq: Every day | ORAL | Status: DC
  Administered 2016-07-11 – 2016-07-12 (×2): 8.6 mg via ORAL
  Filled 2016-07-11 (×2): qty 1

## 2016-07-11 NOTE — Progress Notes (Signed)
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  Subjective:  Patient denies any complaints.  Social History: The patient is accompanied by her middle daughter today.  Allergies:  Allergies  Allergen Reactions  . Codeine Anaphylaxis    Scheduled Medications: . aspirin EC  81 mg Oral Daily  . clopidogrel  75 mg Oral Daily  . pravastatin  40 mg Oral QPM    Review of Systems: GENERAL:  Fatigue.  No fevers, sweats or weight loss. PERFORMANCE STATUS (ECOG):  1 HEENT:  No visual changes, runny nose, sore throat, mouth sores or tenderness. Lungs: No shortness of breath or cough.  No hemoptysis. Cardiac:  No chest pain, palpitations, orthopnea, or PND. GI:  Diarrhea prior to admission.  No nausea, vomiting, constipation, melena or hematochezia. GU:  No urgency, frequency, dysuria, or hematuria. Musculoskeletal:  No back pain.  No joint pain.  No muscle tenderness. Extremities:  Bottom of left foot numb.  No swelling. Skin:  Discoid lupus.  No rashes or skin changes. Neuro:  Hands numb.  No headache, weakness, balance or coordination issues. Endocrine:  No diabetes, thyroid issues, hot flashes or night sweats. Psych:  No mood changes, depression or anxiety. Pain:  No focal pain. Review of systems:  All other systems reviewed and found to be negative.  Physical Exam: Blood pressure (!) 154/71, pulse 82, temperature 98 F (36.7 C), temperature source Oral, resp. rate 20, height 5' 3"  (1.6 m), weight 143 lb (64.9 kg), SpO2 96 %.  GENERAL:  Well developed, well nourished, woman sitting comfortably on the medical unit in no acute distress. MENTAL STATUS:  Alert and oriented to person, place and time. HEAD:  [name] hair pulled back.  Normocephalic, atraumatic, face  symmetric, no Cushingoid features. EYES:  Pupils equal round and reactive to light and accomodation.  No conjunctivitis or scleral icterus. ENT:  Oropharynx clear without lesion.  Tongue normal. Mucous membranes moist.  RESPIRATORY:  Clear to auscultation without rales, wheezes or rhonchi. CARDIOVASCULAR:  Regular rate and rhythm without murmur, rub or gallop. ABDOMEN:  Soft, non-tender, with active bowel sounds, and no hepatosplenomegaly.  No masses. GROIN:  Right groin dressing clean, dry, and intact. SKIN:  No rashes, ulcers or lesions. EXTREMITIES: No edema, no skin discoloration or tenderness.  No palpable cords. LYMPH NODES: No palpable cervical, supraclavicular, axillary or inguinal adenopathy  NEUROLOGICAL: Unremarkable   Results for orders placed or performed during the hospital encounter of 07/07/16 (from the past 48 hour(s))  Prepare RBC     Status: None   Collection Time: 07/09/16  1:22 PM  Result Value Ref Range   Order Confirmation ORDER PROCESSED BY BLOOD BANK   Basic metabolic panel     Status: Abnormal   Collection Time: 07/09/16  1:22 PM  Result Value Ref Range   Sodium 139 135 - 145 mmol/L   Potassium 3.8 3.5 - 5.1 mmol/L   Chloride 111 101 - 111 mmol/L   CO2 23 22 - 32 mmol/L   Glucose, Bld 80 65 - 99 mg/dL   BUN 13 6 - 20 mg/dL   Creatinine, Ser 0.97 0.44 - 1.00 mg/dL   Calcium 8.7 (L) 8.9 - 10.3 mg/dL   GFR calc non Af Amer >60 >60 mL/min   GFR calc Af Amer >60 >60 mL/min  Comment: (NOTE) The eGFR has been calculated using the CKD EPI equation. This calculation has not been validated in all clinical situations. eGFR's persistently <60 mL/min signify possible Chronic Kidney Disease.    Anion gap 5 5 - 15  Hemoglobin and hematocrit, blood     Status: Abnormal   Collection Time: 07/09/16  9:34 PM  Result Value Ref Range   Hemoglobin 8.7 (L) 12.0 - 16.0 g/dL   HCT 24.9 (L) 35.0 - 47.0 %  Surgical pcr screen     Status: None   Collection Time: 07/09/16  10:16 PM  Result Value Ref Range   MRSA, PCR NEGATIVE NEGATIVE   Staphylococcus aureus NEGATIVE NEGATIVE    Comment:        The Xpert SA Assay (FDA approved for NASAL specimens in patients over 57 years of age), is one component of a comprehensive surveillance program.  Test performance has been validated by [facility] for patients greater than or equal to 13 year old. It is not intended to diagnose infection nor to guide or monitor treatment.   CBC     Status: Abnormal   Collection Time: 07/10/16  5:06 AM  Result Value Ref Range   WBC 6.0 3.6 - 11.0 K/uL   RBC 2.50 (L) 3.80 - 5.20 MIL/uL   Hemoglobin 8.4 (L) 12.0 - 16.0 g/dL   HCT 24.3 (L) 35.0 - 47.0 %   MCV 97.4 80.0 - 100.0 fL   MCH 33.5 26.0 - 34.0 pg   MCHC 34.4 32.0 - 36.0 g/dL   RDW 19.6 (H) 11.5 - 14.5 %   Platelets 162 150 - 440 K/uL  Reticulocytes     Status: Abnormal   Collection Time: 07/10/16  5:06 AM  Result Value Ref Range   Retic Ct Pct 11.3 (H) 0.4 - 3.1 %   RBC. 2.50 (L) 3.80 - 5.20 MIL/uL   Retic Count, Manual 282.5 (H) 19.0 - 183.0 K/uL  Hepatitis B surface antigen     Status: None   Collection Time: 07/10/16  5:06 AM  Result Value Ref Range   Hepatitis B Surface Ag Negative Negative    Comment: (NOTE) Performed At: [facility] [address] [place], [place] [id number] Lindon Romp MD [id number]   CMV IgM     Status: Abnormal   Collection Time: 07/10/16  5:06 AM  Result Value Ref Range   CMV IgM 62.0 (H) 0.0 - 29.9 AU/mL    Comment: (NOTE)                                Negative         <30.0                                Equivocal  30.0 - 34.9                                Positive         >34.9 A positive result is generally indicative of acute infection, reactivation or persistent IgM production. Performed At: [facility] [place], [place] [id number] Lindon Romp MD [id number]:[id number]   C3 complement     Status: None   Collection Time:  07/10/16  5:06 AM  Result Value Ref Range   C3  Complement 92 82 - 167 mg/dL    Comment: (NOTE) Performed At: [facility] [place], [place] [id number] Lindon Romp MD [id number]:[id number]   C4 complement     Status: None   Collection Time: 07/10/16  5:06 AM  Result Value Ref Range   Complement C4, Body Fluid 15 14 - 44 mg/dL    Comment: (NOTE) Performed At: [facility] [place], [place] [id number] Lindon Romp MD [id number]   Hepatitis B core antibody, total     Status: Abnormal   Collection Time: 07/10/16  5:06 AM  Result Value Ref Range   Hep B Core Total Ab Positive (A) Negative    Comment: (NOTE) Performed At: [facility] [place], [place] [id number] Lindon Romp MD [id number]   Hepatitis C antibody     Status: Abnormal   Collection Time: 07/10/16  5:06 AM  Result Value Ref Range   HCV Ab 5.0 (H) 0.0 - 0.9 s/co ratio    Comment: (NOTE)                                  Negative:     < 0.8                             Indeterminate: 0.8 - 0.9                                  Positive:     > 0.9 The CDC recommends that a positive HCV antibody result be followed up with a HCV Nucleic Acid Amplification test (572620). Performed At: [facility] [place], [place] [id number] Lindon Romp MD [id number]   CBC with Differential     Status: Abnormal   Collection Time: 07/11/16  5:11 AM  Result Value Ref Range   WBC 5.1 3.6 - 11.0 K/uL   RBC 2.40 (L) 3.80 - 5.20 MIL/uL   Hemoglobin 8.1 (L) 12.0 - 16.0 g/dL   HCT 23.3 (L) 35.0 - 47.0 %   MCV 97.2 80.0 - 100.0 fL   MCH 33.7 26.0 - 34.0 pg   MCHC 34.7 32.0 - 36.0 g/dL   RDW 18.9 (H) 11.5 - 14.5 %   Platelets 152 150 - 440 K/uL   Neutrophils Relative % 71 %   Neutro Abs 3.7 1.4 - 6.5 K/uL   Lymphocytes Relative 19 %   Lymphs Abs 1.0 1.0 - 3.6 K/uL   Monocytes Relative 7 %   Monocytes Absolute 0.4 0.2 - 0.9 K/uL    Eosinophils Relative 2 %   Eosinophils Absolute 0.1 0 - 0.7 K/uL   Basophils Relative 1 %   Basophils Absolute 0.1 0 - 0.1 K/uL  Type and screen [facility]     Status: None   Collection Time: 07/11/16  5:11 AM  Result Value Ref Range   ABO/RH(D) A NEG    Antibody Screen POS    Sample Expiration 07/14/2016    DAT, IgG POS    DAT, complement POS    No results found.  Assessment:  ILISA HAYWORTH is a 60 y.o. 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  One week prior to admission, she felt like  she was coming down with something.  She denied any fever, runny nose, sore throat or cough.  She had some diarrhea.  She denied any new medications or herbal products.  Anemia workup revealed a ferritin 562, iron saturation of 20% and a TIBC of 214 (low).  B12 was 231 (low normal).  Folate was 42.  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  Peripheral smear reveals rouleaux formation.  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 There was no adenopathy.  Spleen was normal.  EGD and colonoscopy on 03/19/2015 revealed gastritis in the body and antrum. Colonoscopy revealed one hyperplastic polyp.  EGD on 07/08/2016 was normal.  No evidence of bleeding.  Plan: 1.  Hematology:  Cold autoantibody.  Typical etiologies include infection (mycoplasmal pneumonia, EBV, CMV), autoimmune disease, and malignancy (lymphoma). She has a history of discoid lupus. Imaging studies reveal no evidence of adenopathy. She denies any history of Raynaud's disease   Work-up to date include + hepatitis C antibody, + hepatitis B core antibody, and + CMV IgM.  Unusual having numerous + results.  Will check titers to confirm. Reticulocyte count high  indicating appropriate marrow response.  C3 and C4 normal.  Await mycoplasma and EBV results as well as cold agglutinin titer, SPEP, immunoglobulins, and free light chains.  Await MMA to r/o B12 deficiency.  Continue with warmed fluids and warm blood if needed.  Hematocrit stable.  2.  Vascular:  Successful reconstruction of the distal aorta and bilateral iliac arteries by vascular surgery.   Tina Asal, MD  07/11/2016, 10:43 AM

## 2016-07-11 NOTE — Evaluation (Signed)
Physical Therapy Evaluation Patient Details Name: Tina Page MRN: [id number] DOB: 1956/02/21 Today's Date: 07/11/2016   History of Present Illness  60 yo female with onset of AKI and lupus COPD was admitted, has received vascular procedure with B percutaneous transluminal angioplasty with stenting and good runoff distally.  Has been transfused and Hgb is 8.1, BP better at 152/59.Has (+) hep C per lab report.  Clinical Impression  Pt is walking with PT on hallway with HHA only due to being up in her room with no AD.  Her plan is to return home with assist of family when ready, and will possibly need to use RW.  Pt has one from previous knee surgery so should not need to get any further equipment.  Continue acutely to progress LE strengthening and have HHPT see her for progression of gait and safety.      Follow Up Recommendations Home health PT    Equipment Recommendations  None recommended by PT    Recommendations for Other Services Rehab consult     Precautions / Restrictions Precautions Precautions: Fall Restrictions Weight Bearing Restrictions: No      Mobility  Bed Mobility Overal bed mobility: Modified Independent                Transfers Overall transfer level: Modified independent Equipment used: 1 person hand held assist                Ambulation/Gait Ambulation/Gait assistance: Min guard Ambulation Distance (Feet): 300 Feet Assistive device: 1 person hand held assist Gait Pattern/deviations: Step-through pattern;Wide base of support;Decreased stride length;Drifts right/left Gait velocity: reduced Gait velocity interpretation: Below normal speed for age/gender General Gait Details: pt is demonstrating mild instability but no outright LOB.  Has not been using AD in her room but up walking around before PT arrival  Stairs            Wheelchair Mobility    Modified Rankin (Stroke Patients Only)       Balance Overall balance  assessment: Needs assistance Sitting-balance support: Feet supported Sitting balance-Leahy Scale: Good     Standing balance support: Single extremity supported Standing balance-Leahy Scale: Fair                               Pertinent Vitals/Pain Pain Assessment: Faces Faces Pain Scale: Hurts a little bit Pain Location: R groin Pain Descriptors / Indicators: Aching Pain Intervention(s): Monitored during session;Premedicated before session;Repositioned (Pt has been up in room walking alone per family)    [place] expects to be discharged to:: Private residence Living Arrangements: Other relatives Available Help at Discharge: Family;Available 24 hours/day Type of Home: House Home Access: Level entry     Home Layout: One level Home Equipment: Walker - 2 wheels;Other (comment) (due to previous knee surgery)      Prior Function Level of Independence: Independent               Hand Dominance        Extremity/Trunk Assessment   Upper Extremity Assessment: Overall WFL for tasks assessed           Lower Extremity Assessment: Generalized weakness      Cervical / Trunk Assessment: Normal  Communication   Communication: No difficulties  Cognition Arousal/Alertness: Awake/alert Behavior During Therapy: WFL for tasks assessed/performed Overall Cognitive Status: Within Functional Limits for tasks assessed  General Comments      Exercises     Assessment/Plan    PT Assessment Patient needs continued PT services  PT Problem List Decreased strength;Decreased range of motion;Decreased activity tolerance;Decreased balance;Decreased mobility;Decreased coordination;Decreased safety awareness;Cardiopulmonary status limiting activity;Decreased skin integrity;Pain (mild pain)          PT Treatment Interventions DME instruction;Gait training;Stair training;Functional mobility training;Therapeutic  activities;Therapeutic exercise;Balance training;Neuromuscular re-education;Cognitive remediation;Patient/family education    PT Goals (Current goals can be found in the Care Plan section)  Acute Rehab PT Goals Patient Stated Goal: to walk more PT Goal Formulation: With patient/family Time For Goal Achievement: 07/25/16 Potential to Achieve Goals: Good    Frequency Min 2X/week   Barriers to discharge   Pt is able to walk with RW at home but fairly steady with no increased pain for gait    Co-evaluation               End of Session Equipment Utilized During Treatment: Gait belt Activity Tolerance: Patient tolerated treatment well;Patient limited by fatigue Patient left: in chair;with call bell/phone within reach;with family/visitor present Nurse Communication: Mobility status    Functional Assessment Tool Used: clinical judgment Functional Limitation: Mobility: Walking and moving around Mobility: Walking and Moving Around Current Status [phone]): At least 20 percent but less than 40 percent impaired, limited or restricted Mobility: Walking and Moving Around Goal Status [phone]): At least 1 percent but less than 20 percent impaired, limited or restricted    Time: 1504-1530 PT Time Calculation (min) (ACUTE ONLY): 26 min   Charges:   PT Evaluation $PT Eval Moderate Complexity: 1 Procedure PT Treatments $Gait Training: 8-22 mins   PT G Codes:   PT G-Codes **NOT FOR INPATIENT CLASS** Functional Assessment Tool Used: clinical judgment Functional Limitation: Mobility: Walking and moving around Mobility: Walking and Moving Around Current Status [id number]): At least 20 percent but less than 40 percent impaired, limited or restricted Mobility: Walking and Moving Around Goal Status [phone]): At least 1 percent but less than 20 percent impaired, limited or restricted    Ramond Dial 07/11/2016, 3:47 PM    Mee Hives, PT MS Acute Rehab Dept. Number: [place] [facility] [place]

## 2016-07-11 NOTE — Progress Notes (Signed)
[facility]  Daily Progress Note   Subjective  - 1 Day Post-Op  Patient has been ambulating today without difficulty her left leg feels good her rest pain has been eliminated.  Objective Vitals:   07/10/16 2054 07/10/16 2322 07/11/16 0545 07/11/16 1250  BP: (!) 163/67 (!) 157/62 (!) 154/71 (!) 152/59  Pulse: 86 83 82 93  Resp: 20  20 (!) 24  Temp: 98.5 F (36.9 C)  98 F (36.7 C) 98.5 F (36.9 C)  TempSrc: Oral  Oral Oral  SpO2: 97%  96% 96%  Weight:      Height:        Intake/Output Summary (Last 24 hours) at 07/11/16 1845 Last data filed at 07/11/16 1300  Gross per 24 hour  Intake             2745 ml  Output             1050 ml  Net             1695 ml    PULM  Normal effort , no use of accessory muscles CV  No JVD, RRR Abd      No distended, nontender VASC  1+ posterior tibial bilaterally 2+ dorsalis pedis on the right with a 1+ dorsalis pedis pulse on the left. Moderate edema left lower extremity  Laboratory CBC    Component Value Date/Time   WBC 5.1 07/11/2016 0511   HGB 8.1 (L) 07/11/2016 0511   HGB 15.4 06/11/2014 1732   HCT 23.3 (L) 07/11/2016 0511   HCT 46.5 06/11/2014 1732   PLT 152 07/11/2016 0511   PLT 127 (L) 06/11/2014 1732    BMET    Component Value Date/Time   NA 139 07/09/2016 1322   NA 142 06/11/2014 1732   K 3.8 07/09/2016 1322   K 3.1 (L) 06/11/2014 1732   CL 111 07/09/2016 1322   CL 103 06/11/2014 1732   CO2 23 07/09/2016 1322   CO2 32 06/11/2014 1732   GLUCOSE 80 07/09/2016 1322   GLUCOSE 100 (H) 06/11/2014 1732   BUN 13 07/09/2016 1322   BUN 15 06/11/2014 1732   CREATININE 0.97 07/09/2016 1322   CREATININE 0.73 06/11/2014 1732   CALCIUM 8.7 (L) 07/09/2016 1322   CALCIUM 8.9 06/11/2014 1732   GFRNONAA >60 07/09/2016 1322   GFRNONAA >60 06/11/2014 1732   GFRAA >60 07/09/2016 1322   GFRAA >60 06/11/2014 1732    Assessment/Planning: POD #1 s/p lower extremity revascularization  Assessment/Plan 1.  Atherosclerotic occlusive disease bilateral lower extremities with rest pain of the left lower extremity:   patient is status post successful revascularization and now has palpable pulses and is ambulating without difficulty. She will continue her antiplatelet therapy. Any changes will be based on the continued workup of her anemia. Continued ambulation is strongly encouraged 2. Anemia:  She will undergo further evaluation workup for her anemia part of which is ongoing at this time. 3. COPD:  She will continue her aerosol treatments without interruption. 4.  Hypertension:  Antihypertensive medications will be continued 5.  Hyperlipidemia: Statin therapy will be continued    Hortencia Pilar  07/11/2016, 6:45 PM

## 2016-07-11 NOTE — Plan of Care (Signed)
Problem: Safety: Goal: Ability to remain free from injury will improve Outcome: Progressing Patient educated on risk for falls and safety while ambulating down the hall

## 2016-07-11 NOTE — Progress Notes (Signed)
[place] [facility] [place] NAME: Tina Page    MR#:  [id number]  DATE OF BIRTH:  22-Feb-1956  SUBJECTIVE:  CHIEF COMPLAINT:   Chief Complaint  Patient presents with  . Hypotension   The patient is 60 year old Caucasian female with medical history significant for 3 day history history of discoid lupus COPD, hyperlipidemia, hypertension, who presented with left leg pain and dizziness. Hypotension,  anemia, had Hemoccult positive stools, renal failure. 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 C3 and C4 were normal. Mycoplasma, EBV results were pending as well as step immunoglobulins and free light chains. Patient was evaluated by vascular surgery and had distal aorta and bilateral iliac artery angiogram with PTCA and stent placement in right common iliac artery, left common iliac artery, left external iliac artery. She feels better now. In regards to left lower extremity, however, admits of right groin pain more than twice in the past. REVIEW OF SYSTEMS:  Review of Systems  Constitutional: Positive for malaise/fatigue. Negative for chills and fever.  Eyes: Negative for blurred vision and double vision.  Respiratory: Negative for cough, hemoptysis, shortness of breath, wheezing and stridor.   Cardiovascular: Negative for chest pain and leg swelling.  Gastrointestinal: Negative for abdominal pain, blood in stool, diarrhea, melena, nausea and vomiting.  Genitourinary: Negative for dysuria, hematuria and urgency.  Musculoskeletal: Negative for joint pain.       Left leg pain  Neurological: Positive for weakness. Negative for dizziness, focal weakness and loss of consciousness.  Psychiatric/Behavioral: Negative for depression. The patient is not nervous/anxious.     DRUG  ALLERGIES:   Allergies  Allergen Reactions  . Codeine Anaphylaxis   VITALS:  Blood pressure (!) 152/59, pulse 93, temperature 98.5 F (36.9 C), temperature source Oral, resp. rate (!) 24, height 5\' 3"  (1.6 m), weight 64.9 kg (143 lb), SpO2 96 %. PHYSICAL EXAMINATION:  Physical Exam  Constitutional: She is oriented to person, place, and time and well-developed, well-nourished, and in no distress.  HENT:  Head: Normocephalic.  Mouth/Throat: Oropharynx is clear and moist.  Eyes: Conjunctivae and EOM are normal.  Neck: Normal range of motion. Neck supple. No JVD present. No tracheal deviation present.  Cardiovascular: Normal rate, regular rhythm and normal heart sounds.  Exam reveals no gallop.   No murmur heard. Pulmonary/Chest: Effort normal and breath sounds normal. No respiratory distress. She has no wheezes. She has no rales.  Abdominal: She exhibits no distension. There is no tenderness.  Musculoskeletal: Normal range of motion. She exhibits no edema or tenderness.  Neurological: She is alert and oriented to person, place, and time. No cranial nerve deficit.  Skin: No rash noted. No erythema.  Psychiatric: Affect normal.   LABORATORY PANEL:   CBC  Recent Labs Lab 07/11/16 0511  WBC 5.1  HGB 8.1*  HCT 23.3*  PLT 152   ------------------------------------------------------------------------------------------------------------------ Chemistries   Recent Labs Lab 07/07/16 1546  07/09/16 1322  NA 137  < > 139  K 4.1  < > 3.8  CL 103  < > 111  CO2 27  < > 23  GLUCOSE 107*  < > 80  BUN 30*  < > 13  CREATININE 1.69*  < > 0.97  CALCIUM 8.8*  < > 8.7*  AST 26  --   --   ALT  9*  --   --   ALKPHOS 66  --   --   BILITOT 1.4*  --   --   < > = values in this interval not displayed. RADIOLOGY:  No results found. ASSESSMENT AND PLAN:   Acute renal insufficiency, improved with IV fluids , avoid nephrotoxins. Hold lisinopril. Recheck creatinine in the  morning  Symptomatic nemia, not secondary to GIB,  but cold auto antibody, Hemoccult is negative per Dr. Tiffany Kocher. No PUD, EGD done by Dr. Tiffany Kocher. Anemia workup does not show iron deficiency. Hb down to 7.0. S/p 1 units PRBC transfusion for vascular procedure. Hb up to 8.1 today. Follow-up hemoglobin in am.  Atherosclerotic occlusive disease bilateral lower extremities with rest pain of the left lower extremity. Per Dr. Delana Meyer, vascular consult, status postangiography 10th of November 2017 with PTCA and stent placement in right and left common iliac arteries and left external iliac artery with improvement of left lower extremity pain, continue aspirin and Plavix postoperatively. Getting physical therapist involved for recommendations, continue pain medications as needed    HTN (hypertension) - stable, hold lisinopril due to renal failure.    HLD (hyperlipidemia) - home meds    COPD (chronic obstructive pulmonary disease) ([place]) - continue home inhalers, stable.  Tobacco abuse. Smoking cessation was counseled for 3 minutes, given nicotine patch.  All the records are reviewed and case discussed with Care Management/Social Worker. Management plans discussed with the patient, her daughter and they are in agreement.  CODE STATUS: Full code  TOTAL TIME TAKING CARE OF THIS PATIENT: 35 minutes.  Discussed with patient's family, all questions were answered More than 50% of the time was spent in counseling/coordination of care: YES  POSSIBLE D/C IN 1 DAYS, DEPENDING ON CLINICAL CONDITION.   Theodoro Grist M.D on 07/11/2016 at 1:04 PM  Between 7am to 6pm - Pager - [phone]  After 6pm go to [email or web address] - Proofreader  [facility]  Office  [phone]  CC: Primary care physician; Frazier Richards, MD  Note: This dictation was prepared with Dragon dictation along with smaller phrase technology. Any transcriptional errors that result from this process are  unintentional.

## 2016-07-12 DIAGNOSIS — B1920 Unspecified viral hepatitis C without hepatic coma: Secondary | ICD-10-CM

## 2016-07-12 DIAGNOSIS — Z720 Tobacco use: Secondary | ICD-10-CM

## 2016-07-12 DIAGNOSIS — I739 Peripheral vascular disease, unspecified: Secondary | ICD-10-CM

## 2016-07-12 DIAGNOSIS — Z9861 Coronary angioplasty status: Secondary | ICD-10-CM

## 2016-07-12 DIAGNOSIS — R768 Other specified abnormal immunological findings in serum: Secondary | ICD-10-CM

## 2016-07-12 DIAGNOSIS — D5912 Cold autoimmune hemolytic anemia: Secondary | ICD-10-CM

## 2016-07-12 DIAGNOSIS — D591 Other autoimmune hemolytic anemias: Secondary | ICD-10-CM

## 2016-07-12 DIAGNOSIS — B259 Cytomegaloviral disease, unspecified: Secondary | ICD-10-CM

## 2016-07-12 HISTORY — DX: Unspecified viral hepatitis C without hepatic coma: B19.20

## 2016-07-12 HISTORY — DX: Cytomegaloviral disease, unspecified: B25.9

## 2016-07-12 HISTORY — DX: Coronary angioplasty status: Z98.61

## 2016-07-12 LAB — CULTURE, BLOOD (ROUTINE X 2)
CULTURE: NO GROWTH
Culture: NO GROWTH

## 2016-07-12 LAB — CBC
HCT: 22.9 % — ABNORMAL LOW (ref 35.0–47.0)
Hemoglobin: 7.8 g/dL — ABNORMAL LOW (ref 12.0–16.0)
MCH: 32.8 pg (ref 26.0–34.0)
MCHC: 34.1 g/dL (ref 32.0–36.0)
MCV: 96.1 fL (ref 80.0–100.0)
Platelets: 161 10*3/uL (ref 150–440)
RBC: 2.38 MIL/uL — ABNORMAL LOW (ref 3.80–5.20)
RDW: 19.6 % — ABNORMAL HIGH (ref 11.5–14.5)
WBC: 6.1 10*3/uL (ref 3.6–11.0)

## 2016-07-12 MED ORDER — DOCUSATE SODIUM 100 MG PO CAPS: 100.0000 mg | ORAL_CAPSULE | Freq: Two times a day (BID) | ORAL | 0 refills | Status: DC

## 2016-07-12 MED ORDER — CLOPIDOGREL BISULFATE 75 MG PO TABS: 75.0000 mg | ORAL_TABLET | Freq: Every day | ORAL | 5 refills | Status: AC

## 2016-07-12 MED ORDER — TRAMADOL HCL 50 MG PO TABS: 50.0000 mg | ORAL_TABLET | Freq: Four times a day (QID) | ORAL | 0 refills | Status: DC | PRN

## 2016-07-12 NOTE — Discharge Summary (Signed)
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 COPD (chronic obstructive pulmonary disease) ([place])   . HLD (hyperlipidemia)   . Hypertension     .pro HOSPITAL COURSE:   The patient is 60 year old female with past medical history significant for history of COPD, hyperlipidemia, essential hypertension, who presents to the hospital with complaints of 3 day history of left lower extremity pain on exertion and at rest. On arrival to emergency room, she was noted to be hypotensive, anemic,  lab work revealed a mild renal insufficiency. Patient was admitted to the hospital for further evaluation and treatment.. Patient was seen by gastroenterologist and underwent EGD and colonoscopy in the past, July 2016. EGD showed gastritis in body and antrum, pathology was negative for celiac disease, colonoscopy revealed one hyperplastic polyp and internal hemorrhoids. CT scan done on current admission showed moderate diffuse atherosclerotic vascular disease of abdominal aorta with severe stenosis of left  common iliac artery with suspected short segment occlusion. There was no adenopathy and spleen was normal. The patient underwent EGD on 07/08/2016 by Dr. Vira Agar and, esophagus was normal, mucosa looked concerning for celiac disease. Anemia workup revealed ferritin level of 562, Iron studies showed saturation of 20% and TIBC was 214, which was low. B12 level was normal 231, Fortical, lap acid level was 42, Coombs revealed cold auto antibody, IgG and complement. Patient was seen by oncologist who felt that patient had no evidence of GI bleeding, likely cold auto antibody. Anemia, infection was investigated including mycoplasma, EB virus cytomegaloviral virus. Workup revealed positivity for hepatitis C antibody, hepatitis B core antibody, and positive for cytomegalovirus IgM. Reticulocyte count was high, indicating appropriate marrow response, C3 and C4 were normal. Mycoplasma and EB virus were pending as well as SPEP immunoglobulins and free light chains, MMA for B12 deficiency is also pending. Patient underwent successful revascularization of atherosclerotic occlusive disease of bilateral lower extremities. She was recommended to continue dual antiplatelet therapy with aspirin and Plavix. For hyperlipidemia she is to continue statin therapy. The patient was evaluated by physical therapist and recommended home health services. She was felt to be stable to be discharged home.  Discussion by problem:  #1 Acute renal insufficiency, improved with IV fluids , avoid nephrotoxins. Resume lisinopril. Recheck creatinine as outpatient  #2 Symptomatic anemia, not secondary to GIB,  but cold auto antibody hemolytic anemia, Hemoccult was negative per Dr. Tiffany Kocher. No PUD, EGD done by Dr. Tiffany Kocher. Anemia workup did not show iron deficiency. S/p 1 units PRBC transfusion for vascular procedure. Hb 7.8 today. Follow-up hemoglobin as outpatient.  Patient is to follow-up with Dr. Mike Gip in about one week after discharge for  further recommendations.   #3 Atherosclerotic occlusive disease bilateral lower extremities with rest pain of the left lower extremity. Per Dr. Delana Meyer, vascular consult, status post angiography 10th of November 2017 with PTCA and stent placement in right and left common iliac arteries and left external iliac artery with improvement of left lower extremity pain, continue aspirin and Plavix postoperatively. physical therapist saw patient in consultation and recommended home health services. Continue tramadol as needed. Patient will be excused from work for about one week, she is to follow-up with her primary care physician and vascular surgeon and receive her excuse from work even longer if needed.  #4 HTN (hypertension) - resume lisinopril  #5 HLD (hyperlipidemia) - home meds  #6 COPD (chronic obstructive pulmonary disease) ([place]) - continue home inhalers, stable.  #7 Tobacco abuse. Smoking cessation was recommended, all questions were answered. Support provided  DISCHARGE CONDITIONS:   Stable  CONSULTS OBTAINED:  Treatment Team:  Katha Cabal, MD Manya Silvas, MD Lequita Asal, MD  DRUG ALLERGIES:   Allergies  Allergen Reactions  . Codeine Anaphylaxis    DISCHARGE MEDICATIONS:   Discharge Medication List as of 07/12/2016 10:38 AM    START taking these medications   Details  clopidogrel (PLAVIX) 75 MG tablet Take 1 tablet (75 mg total) by mouth daily., Starting Mon 07/13/2016, Normal    docusate sodium (COLACE) 100 MG capsule Take 1 capsule (100 mg total) by mouth 2 (two) times daily., Starting Sun 07/12/2016, Normal    traMADol (ULTRAM) 50 MG tablet Take 1 tablet (50 mg total) by mouth every 6 (six) hours as needed for moderate pain or severe pain., Starting Sun 07/12/2016, Normal      CONTINUE these medications which have NOT CHANGED   Details  albuterol (PROVENTIL HFA;VENTOLIN HFA) 108 (90 Base) MCG/ACT inhaler Inhale 2 puffs into the lungs as  needed for wheezing. , Historical Med    aspirin EC 81 MG tablet Take 81 mg by mouth daily., Historical Med    lisinopril (PRINIVIL,ZESTRIL) 5 MG tablet Take 5 mg by mouth daily., Historical Med    pravastatin (PRAVACHOL) 40 MG tablet Take 40 mg by mouth every evening., Historical Med      STOP taking these medications     potassium chloride SA (K-DUR,KLOR-CON) 20 MEQ tablet          DISCHARGE INSTRUCTIONS:    The patient is to follow-up with primary care physician, vascular surgeon, oncologist as outpatient within 1 week after discharge  If you experience worsening of your admission symptoms, develop shortness of breath, life threatening emergency, suicidal or homicidal thoughts you must seek medical attention immediately by calling 911 or calling your MD immediately  if symptoms less severe.  You Must read complete instructions/literature along with all the possible adverse reactions/side effects for all the Medicines you take and that have been prescribed to you. Take any new Medicines after you have completely understood and accept all the possible adverse reactions/side effects.   Please note  You were cared for by a hospitalist during your hospital stay. If you have any questions about your discharge medications or the care you received while you were in the hospital after you are discharged, you can call the unit and asked to speak with the hospitalist on call if the hospitalist that took care of you is not available. Once you are discharged, your primary care physician will  handle any further medical issues. Please note that NO REFILLS for any discharge medications will be authorized once you are discharged, as it is imperative that you return to your primary care physician (or establish a relationship with a primary care physician if you do not have one) for your aftercare needs so that they can reassess your need for medications and monitor your lab values.    Today    CHIEF COMPLAINT:   Chief Complaint  Patient presents with  . Hypotension    HISTORY OF PRESENT ILLNESS:  Tina Page  is a 60 y.o. female with a known history of COPD, hyperlipidemia, essential hypertension, who presents to the hospital with complaints of 3 day history of left lower extremity pain on exertion and at rest. On arrival to emergency room, she was noted to be hypotensive, anemic,  lab work revealed a mild renal insufficiency. Patient was admitted to the hospital for further evaluation and treatment.. Patient was seen by gastroenterologist and underwent EGD and colonoscopy in the past, July 2016. EGD showed gastritis in body and antrum, pathology was negative for celiac disease, colonoscopy revealed one hyperplastic polyp and internal hemorrhoids. CT scan done on current admission showed moderate diffuse atherosclerotic vascular disease of abdominal aorta with severe stenosis of left common iliac artery with suspected short segment occlusion. There was no adenopathy and spleen was normal. The patient underwent EGD on 07/08/2016 by Dr. Vira Agar and, esophagus was normal, mucosa looked concerning for celiac disease. Anemia workup revealed ferritin level of 562, Iron studies showed saturation of 20% and TIBC was 214, which was low. B12 level was normal 231, Fortical, lap acid level was 42, Coombs revealed cold auto antibody, IgG and complement. Patient was seen by oncologist who felt that patient had no evidence of GI bleeding, likely cold auto antibody. Anemia, infection was investigated including mycoplasma, EB virus cytomegaloviral virus. Workup revealed positivity for hepatitis C antibody, hepatitis B core antibody, and positive for cytomegalovirus IgM. Reticulocyte count was high, indicating appropriate marrow response, C3 and C4 were normal. Mycoplasma and EB virus were pending as well as SPEP immunoglobulins and free light chains, MMA for B12 deficiency is also pending. Patient  underwent successful revascularization of atherosclerotic occlusive disease of bilateral lower extremities. She was recommended to continue dual antiplatelet therapy with aspirin and Plavix. For hyperlipidemia she is to continue statin therapy. The patient was evaluated by physical therapist and recommended home health services. She was felt to be stable to be discharged home.  Discussion by problem:  #1 Acute renal insufficiency, improved with IV fluids , avoid nephrotoxins. Resume lisinopril. Recheck creatinine as outpatient  #2 Symptomatic anemia, not secondary to GIB,  but cold auto antibody hemolytic anemia, Hemoccult was negative per Dr. Tiffany Kocher. No PUD, EGD done by Dr. Tiffany Kocher. Anemia workup did not show iron deficiency. S/p 1 units PRBC transfusion for vascular procedure. Hb 7.8 today. Follow-up hemoglobin as outpatient. Patient is to follow-up with Dr. Mike Gip in about one week after discharge for further recommendations.   #3 Atherosclerotic occlusive disease bilateral lower extremities with rest pain of the left lower extremity. Per Dr. Delana Meyer, vascular consult, status post angiography 10th of November 2017 with PTCA and stent placement in right and left common iliac arteries and left external iliac artery with improvement of left lower extremity pain, continue aspirin and Plavix postoperatively. physical therapist saw patient in consultation and recommended home health services. Continue tramadol as needed. Patient will be excused from work for about one week, she  is to follow-up with her primary care physician and vascular surgeon and receive her excuse from work even longer if needed.  #4 HTN (hypertension) - resume lisinopril  #5 HLD (hyperlipidemia) - home meds  #6 COPD (chronic obstructive pulmonary disease) ([place]) - continue home inhalers, stable.  #7 Tobacco abuse. Smoking cessation was recommended, all questions were answered. Support provided    VITAL SIGNS:  Blood  pressure (!) 155/63, pulse 94, temperature 99.5 F (37.5 C), temperature source Oral, resp. rate 20, height 5\' 3"  (1.6 m), weight 64.9 kg (143 lb), SpO2 98 %.  I/O:   Intake/Output Summary (Last 24 hours) at 07/12/16 1237 Last data filed at 07/12/16 0900  Gross per 24 hour  Intake              390 ml  Output                0 ml  Net              390 ml    PHYSICAL EXAMINATION:  GENERAL:  60 y.o.-year-old patient lying in the bed with no acute distress.  EYES: Pupils equal, round, reactive to light and accommodation. No scleral icterus. Extraocular muscles intact.  HEENT: Head atraumatic, normocephalic. Oropharynx and nasopharynx clear.  NECK:  Supple, no jugular venous distention. No thyroid enlargement, no tenderness.  LUNGS: Normal breath sounds bilaterally, no wheezing, rales,rhonchi or crepitation. No use of accessory muscles of respiration.  CARDIOVASCULAR: S1, S2 normal. No murmurs, rubs, or gallops.  ABDOMEN: Soft, non-tender, non-distended. Bowel sounds present. No organomegaly or mass.  EXTREMITIES: No pedal edema, cyanosis, or clubbing.  NEUROLOGIC: Cranial nerves II through XII are intact. Muscle strength 5/5 in all extremities. Sensation intact. Gait not checked.  PSYCHIATRIC: The patient is alert and oriented x 3.  SKIN: No obvious rash, lesion, or ulcer.   DATA REVIEW:   CBC  Recent Labs Lab 07/12/16 0517  WBC 6.1  HGB 7.8*  HCT 22.9*  PLT 161    Chemistries   Recent Labs Lab 07/07/16 1546  07/09/16 1322  NA 137  < > 139  K 4.1  < > 3.8  CL 103  < > 111  CO2 27  < > 23  GLUCOSE 107*  < > 80  BUN 30*  < > 13  CREATININE 1.69*  < > 0.97  CALCIUM 8.8*  < > 8.7*  AST 26  --   --   ALT 9*  --   --   ALKPHOS 66  --   --   BILITOT 1.4*  --   --   < > = values in this interval not displayed.  Cardiac Enzymes No results for input(s): TROPONINI in the last 168 hours.  Microbiology Results  Results for orders placed or performed during the hospital  encounter of 07/07/16  Blood Culture (routine x 2)     Status: None   Collection Time: 07/07/16  5:23 PM  Result Value Ref Range Status   Specimen Description BLOOD LAC  Final   Special Requests BOTTLES DRAWN AEROBIC AND ANAEROBIC 2CC  Final   Culture NO GROWTH 5 DAYS  Final   Report Status 07/12/2016 FINAL  Final  Blood Culture (routine x 2)     Status: None   Collection Time: 07/07/16  5:23 PM  Result Value Ref Range Status   Specimen Description BLOOD RAC  Final   Special Requests BOTTLES DRAWN AEROBIC AND ANAEROBIC 2CC  Final  Culture NO GROWTH 5 DAYS  Final   Report Status 07/12/2016 FINAL  Final  Surgical pcr screen     Status: None   Collection Time: 07/09/16 10:16 PM  Result Value Ref Range Status   MRSA, PCR NEGATIVE NEGATIVE Final   Staphylococcus aureus NEGATIVE NEGATIVE Final    Comment:        The Xpert SA Assay (FDA approved for NASAL specimens in patients over 45 years of age), is one component of a comprehensive surveillance program.  Test performance has been validated by [facility] for patients greater than or equal to 59 year old. It is not intended to diagnose infection nor to guide or monitor treatment.     RADIOLOGY:  No results found.  EKG:   Orders placed or performed during the hospital encounter of 07/07/16  . ED EKG  . ED EKG  . EKG 12-Lead  . EKG 12-Lead  . ED EKG 12-Lead  . ED EKG 12-Lead  . EKG 12-Lead  . EKG 12-Lead      Management plans discussed with the patient, family and they are in agreement.  CODE STATUS:     Code Status Orders        Start     Ordered   07/07/16 2230  Full code  Continuous     07/07/16 2229    Code Status History    Date Active Date Inactive Code Status Order ID Comments User Context   This patient has a current code status but no historical code status.      TOTAL TIME TAKING CARE OF THIS PATIENT: 40 minutes.    Theodoro Grist M.D on 07/12/2016 at 12:37 PM  Between 7am to 6pm - Pager  - [phone]  After 6pm go to [email or web address] - password [email or web address] [facility]  Office  [phone]  CC: Primary care physician; Frazier Richards, MD

## 2016-07-12 NOTE — Progress Notes (Signed)
        To Whom It May Concern:        Ms. Tina Page was hospitalized at [facility]  07/07/2016 through 07/12/2016, she is to return to work in full capacity 07/20/2016 unless directed differently by primary care physician or vascular surgeon. Thank you for your understanding.        Sincerely,  Theodoro Grist, MD

## 2016-07-12 NOTE — Care Management Note (Signed)
Case Management Note  Patient Details  Name: Tina Page MRN: [id number] Date of Birth: September 03, 1955  Subjective/Objective:    [name] resides in [place]. Her first two choices of home health providers, [facility] and [facility] do not service her address. A referral was sent to [facility] for home health PT and Nurse Aide after [facility]  reported to this writer that [name] address is within their catchment area.                 Action/Plan:   Expected Discharge Date:                  Expected Discharge Plan:     In-House Referral:     Discharge planning Services     Post Acute Care Choice:    Choice offered to:     DME Arranged:    DME Agency:     HH Arranged:    HH Agency:     Status of Service:     If discussed at [employer] of Stay Meetings, dates discussed:    Additional Comments:  Curtis Cain A, RN 07/12/2016, 1:50 PM

## 2016-07-12 NOTE — Progress Notes (Signed)
07/12/2016 11:57 AM  BP (!) 155/63 (BP Location: Left Arm)   Pulse 94   Temp 99.5 F (37.5 C) (Oral)   Resp 20   Ht 5\' 3"  (1.6 m)   Wt 64.9 kg (143 lb)   SpO2 98%   BMI 25.33 kg/m  Patient discharged per MD orders. Discharge instructions reviewed with patient and patient verbalized understanding. IV removed per policy. Prescriptions discussed and given to patient. Discharged via wheelchair escorted by nursing staff.  Almedia Balls, RN

## 2016-07-13 ENCOUNTER — Encounter: Payer: Self-pay | Admitting: Vascular Surgery

## 2016-07-13 LAB — HCV RNA QUANT RFLX ULTRA OR GENOTYP
HCV RNA Qnt(log copy/mL): UNDETERMINED log10 IU/mL
HepC Qn: NOT DETECTED IU/mL

## 2016-07-13 LAB — KAPPA/LAMBDA LIGHT CHAINS
Kappa free light chain: 74.8 mg/L — ABNORMAL HIGH (ref 3.3–19.4)
Kappa, lambda light chain ratio: 1.34 (ref 0.26–1.65)
Lambda free light chains: 56 mg/L — ABNORMAL HIGH (ref 5.7–26.3)

## 2016-07-13 LAB — COLD AGGLUTININ TITER: Cold Agglutinin Titer: NEGATIVE

## 2016-07-13 LAB — ANTINUCLEAR ANTIBODIES, IFA: ANA Ab, IFA: NEGATIVE

## 2016-07-14 ENCOUNTER — Telehealth (INDEPENDENT_AMBULATORY_CARE_PROVIDER_SITE_OTHER): Payer: Self-pay

## 2016-07-14 LAB — MULTIPLE MYELOMA PANEL, SERUM
Albumin SerPl Elph-Mcnc: 3.2 g/dL (ref 2.9–4.4)
Albumin/Glob SerPl: 1.2 (ref 0.7–1.7)
Alpha 1: 0.3 g/dL (ref 0.0–0.4)
Alpha2 Glob SerPl Elph-Mcnc: 0.5 g/dL (ref 0.4–1.0)
B-Globulin SerPl Elph-Mcnc: 0.8 g/dL (ref 0.7–1.3)
Gamma Glob SerPl Elph-Mcnc: 1.2 g/dL (ref 0.4–1.8)
Globulin, Total: 2.8 g/dL (ref 2.2–3.9)
IgA: 237 mg/dL (ref 87–352)
IgG (Immunoglobin G), Serum: 876 mg/dL (ref 700–1600)
IgM, Serum: 389 mg/dL — ABNORMAL HIGH (ref 26–217)
Total Protein ELP: 6 g/dL (ref 6.0–8.5)

## 2016-07-14 LAB — MYCOPLASMA PNEUMONIAE ANTIBODY, IGM: Mycoplasma pneumo IgM: <770 U/mL (ref 0–769)

## 2016-07-14 LAB — HEPATITIS B DNA, ULTRAQUANTITATIVE, PCR
HBV DNA SERPL PCR-ACNC: NOT DETECTED IU/mL
HBV DNA SERPL PCR-LOG IU: UNDETERMINED log10IU/mL

## 2016-07-14 NOTE — Telephone Encounter (Signed)
Patient daughter called stating that her mom had a knot,brusing,and some warmness to the area where the stent was place.I spoke with KS and she said if patient has a fever,drainage,or increase redness with warmth for her to call the office so we can get her in sooner.

## 2016-07-16 LAB — METHYLMALONIC ACID, SERUM: Methylmalonic Acid, Quantitative: 330 nmol/L (ref 0–378)

## 2016-07-17 NOTE — Progress Notes (Signed)
[facility]  Patient Status: Closed, patient refused [facility] services.     Tina Page 07/17/2016, 3:33 PM

## 2016-07-20 ENCOUNTER — Ambulatory Visit (INDEPENDENT_AMBULATORY_CARE_PROVIDER_SITE_OTHER): Payer: BLUE CROSS/BLUE SHIELD | Admitting: Vascular Surgery

## 2016-07-20 ENCOUNTER — Encounter (INDEPENDENT_AMBULATORY_CARE_PROVIDER_SITE_OTHER): Payer: Self-pay | Admitting: Vascular Surgery

## 2016-07-20 VITALS — BP 108/62 | HR 108 | Resp 17 | Ht 63.0 in | Wt 140.8 lb

## 2016-07-20 DIAGNOSIS — M79605 Pain in left leg: Secondary | ICD-10-CM | POA: Diagnosis not present

## 2016-07-20 DIAGNOSIS — I10 Essential (primary) hypertension: Secondary | ICD-10-CM

## 2016-07-20 DIAGNOSIS — I70219 Atherosclerosis of native arteries of extremities with intermittent claudication, unspecified extremity: Secondary | ICD-10-CM | POA: Insufficient documentation

## 2016-07-20 DIAGNOSIS — I70213 Atherosclerosis of native arteries of extremities with intermittent claudication, bilateral legs: Secondary | ICD-10-CM | POA: Diagnosis not present

## 2016-07-20 HISTORY — DX: Atherosclerosis of native arteries of extremities with intermittent claudication, unspecified extremity: I70.219

## 2016-07-20 NOTE — Progress Notes (Signed)
MRN : [id number]  Tina Page is a 60 y.o. (July 13, 1956) female who presents with chief complaint of  Chief Complaint  Patient presents with  . New Patient (Initial Visit)  .  History of Present Illness:  The patient is seen for evaluation of painful lower extremities and diminished pulses. Patient notes the pain is always associated with activity and is very consistent day today. Typically, the pain occurs at less than one block, progress is as activity continues to the point that the patient must stop walking. Resting including standing still for several minutes allowed resumption of the activity and the ability to walk a similar distance before stopping again. Uneven terrain and inclined shorten the distance. The pain has been progressive over the past several years. The patient states the inability to walk is now having a profound negative impact on quality of life and daily activities.  The patient has a history of previous interventions  The patient denies rest pain or dangling of an extremity off the side of the bed during the night for relief. No open wounds or sores at this time.  No history of back problems or DJD of the lumbar sacral spine.   The patient denies changes in claudication symptoms or new rest pain symptoms.  No new ulcers or wounds of the foot.  The patient's blood pressure has been stable and relatively well controlled. The patient denies amaurosis fugax or recent TIA symptoms. There are no recent neurological changes noted. The patient denies history of DVT, PE or superficial thrombophlebitis. The patient denies recent episodes of angina or shortness of breath.   Current Meds  Medication Sig  . albuterol (PROVENTIL HFA;VENTOLIN HFA) 108 (90 Base) MCG/ACT inhaler Inhale 2 puffs into the lungs as needed for wheezing.   [name] Kitchen aspirin EC 81 MG tablet Take 81 mg by mouth daily.  . clopidogrel (PLAVIX) 75 MG tablet Take 1 tablet (75 mg total) by mouth daily.  [name] Kitchen  docusate sodium (COLACE) 100 MG capsule Take 1 capsule (100 mg total) by mouth 2 (two) times daily.  [name] Kitchen lisinopril (PRINIVIL,ZESTRIL) 5 MG tablet Take 5 mg by mouth daily.  . pravastatin (PRAVACHOL) 40 MG tablet Take 40 mg by mouth every evening.  . traMADol (ULTRAM) 50 MG tablet Take 1 tablet (50 mg total) by mouth every 6 (six) hours as needed for moderate pain or severe pain.    Past Medical History:  Diagnosis Date  . COPD (chronic obstructive pulmonary disease) ([place])   . HLD (hyperlipidemia)   . Hypertension     Past Surgical History:  Procedure Laterality Date  . ESOPHAGOGASTRODUODENOSCOPY (EGD) WITH PROPOFOL N/A 07/08/2016   Procedure: ESOPHAGOGASTRODUODENOSCOPY (EGD) WITH PROPOFOL;  Surgeon: Manya Silvas, MD;  Location: [facility] ENDOSCOPY;  Service: Endoscopy;  Laterality: N/A;  . KNEE SURGERY    . PERIPHERAL VASCULAR CATHETERIZATION N/A 07/10/2016   Procedure: Lower Extremity Angiography;  Surgeon: Katha Cabal, MD;  Location: [place] CV LAB;  Service: Cardiovascular;  Laterality: N/A;  . PERIPHERAL VASCULAR CATHETERIZATION N/A 07/10/2016   Procedure: Abdominal Aortogram w/Lower Extremity;  Surgeon: Katha Cabal, MD;  Location: [place] CV LAB;  Service: Cardiovascular;  Laterality: N/A;  . PERIPHERAL VASCULAR CATHETERIZATION  07/10/2016   Procedure: Lower Extremity Intervention;  Surgeon: Katha Cabal, MD;  Location: [place] CV LAB;  Service: Cardiovascular;;    Social History Social History  Substance Use Topics  . Smoking status: Current Some Day Smoker    Packs/day: 0.50  Types: Cigarettes  . Smokeless tobacco: Never Used  . Alcohol use No    Family History Family History  Problem Relation Age of Onset  . Diabetes Mother   . Hypertension Mother   No family history of bleeding/clotting disorders, porphyria or autoimmune disease   Allergies  Allergen Reactions  . Codeine Anaphylaxis     REVIEW OF SYSTEMS (Negative unless  checked)  Constitutional: [] Weight loss  [] Fever  [] Chills Cardiac: [] Chest pain   [] Chest pressure   [] Palpitations   [] Shortness of breath when laying flat   [] Shortness of breath with exertion. Vascular:  [] Pain in legs with walking   [] Pain in legs at rest  [] History of DVT   [] Phlebitis   [] Swelling in legs   [] Varicose veins   [] Non-healing ulcers Pulmonary:   [] Uses home oxygen   [] Productive cough   [] Hemoptysis   [] Wheeze  [] COPD   [] Asthma Neurologic:  [] Dizziness   [] Seizures   [] History of stroke   [] History of TIA  [] Aphasia   [] Vissual changes   [] Weakness or numbness in arm   [] Weakness or numbness in leg Musculoskeletal:   [] Joint swelling   [] Joint pain   [] Low back pain Hematologic:  [] Easy bruising  [] Easy bleeding   [] Hypercoagulable state   [] Anemic Gastrointestinal:  [] Diarrhea   [] Vomiting  [] Gastroesophageal reflux/heartburn   [] Difficulty swallowing. Genitourinary:  [] Chronic kidney disease   [] Difficult urination  [] Frequent urination   [] Blood in urine Skin:  [] Rashes   [] Ulcers  Psychological:  [] History of anxiety   []  History of major depression.  Physical Examination  Vitals:   07/20/16 1524  BP: 108/62  Pulse: (!) 108  Resp: 17  Weight: 140 lb 12.8 oz (63.9 kg)  Height: 5\' 3"  (1.6 m)   Body mass index is 24.94 kg/m. Gen: WD/WN, NAD Head: [place]/AT, No temporalis wasting.  Ear/Nose/Throat: Hearing grossly intact, nares w/o erythema or drainage, poor dentition Eyes: PER, EOMI, sclera nonicteric.  Neck: Supple, no masses.  No bruit or JVD.  Pulmonary:  Good air movement, clear to auscultation bilaterally, no use of accessory muscles.  Cardiac: RRR, normal S1, S2, no Murmurs. Vascular: feet cool cap refill 2-3 sec no ulcers Vessel Right Left  Radial Palpable Palpable  Ulnar Palpable Palpable  Brachial Palpable Palpable  Carotid Palpable Palpable  Femoral Palpable Palpable  Popliteal Not Palpable Not Palpable  PT Not Palpable Not Palpable  DP Not  Palpable Not Palpable   Gastrointestinal: soft, non-distended. No guarding/no peritoneal signs.  Musculoskeletal: M/S 5/5 throughout.  No deformity or atrophy.  Neurologic: CN 2-12 intact. Pain and light touch intact in extremities.  Symmetrical.  Speech is fluent. Motor exam as listed above. Psychiatric: Judgment intact, Mood & affect appropriate for pt's clinical situation. Dermatologic: No rashes or ulcers noted.  No changes consistent with cellulitis. Lymph : No Cervical lymphadenopathy, no lichenification or skin changes of chronic lymphedema.  CBC Lab Results  Component Value Date   WBC 6.1 07/12/2016   HGB 7.8 (L) 07/12/2016   HCT 22.9 (L) 07/12/2016   MCV 96.1 07/12/2016   PLT 161 07/12/2016    BMET    Component Value Date/Time   NA 139 07/09/2016 1322   NA 142 06/11/2014 1732   K 3.8 07/09/2016 1322   K 3.1 (L) 06/11/2014 1732   CL 111 07/09/2016 1322   CL 103 06/11/2014 1732   CO2 23 07/09/2016 1322   CO2 32 06/11/2014 1732   GLUCOSE 80 07/09/2016 1322   GLUCOSE  100 (H) 06/11/2014 1732   BUN 13 07/09/2016 1322   BUN 15 06/11/2014 1732   CREATININE 0.97 07/09/2016 1322   CREATININE 0.73 06/11/2014 1732   CALCIUM 8.7 (L) 07/09/2016 1322   CALCIUM 8.9 06/11/2014 1732   GFRNONAA >60 07/09/2016 1322   GFRNONAA >60 06/11/2014 1732   GFRAA >60 07/09/2016 1322   GFRAA >60 06/11/2014 1732   Estimated Creatinine Clearance: 56.9 mL/min (by C-G formula based on SCr of 0.97 mg/dL).  COAG No results found for: INR, PROTIME  Radiology Dg Chest 2 View  Result Date: 07/07/2016 CLINICAL DATA:  60 year old female felt dizzy 2 days ago. Low blood pressure. Initial encounter. EXAM: CHEST  2 VIEW COMPARISON:  06/11/2014 chest x-ray. FINDINGS: Central pulmonary vascular prominence. No infiltrate, congestive heart failure or pneumothorax. No plain film evidence of pulmonary malignancy. Heart size within normal limits. Calcified aorta. No acute osseous abnormality. IMPRESSION: No  active cardiopulmonary disease. Electronically Signed   By: Genia Del M.D.   On: 07/07/2016 17:17   Ct Head Wo Contrast  Result Date: 07/07/2016 CLINICAL DATA:  60 y/o  F; left leg numbness. EXAM: CT HEAD WITHOUT CONTRAST TECHNIQUE: Contiguous axial images were obtained from the base of the skull through the vertex without intravenous contrast. COMPARISON:  None. FINDINGS: Brain: No evidence of acute infarction, hemorrhage, hydrocephalus, extra-axial collection or mass lesion/mass effect. Vascular: Contrast opacification of dural venous sinuses and arteries from recent CT angiogram. Moderate calcific atherosclerosis of cavernous internal carotid arteries. Skull: Normal. Negative for fracture or focal lesion. Sinuses/Orbits: No acute finding. Other: None. IMPRESSION: No acute intracranial abnormality identified. If symptoms persist or if clinically indicated MRI is more sensitive for acute stroke. Unremarkable CT of head for age. Electronically Signed   By: Kristine Garbe M.D.   On: 07/07/2016 19:41   Ct Angio Chest/abd/pel For Dissection W And/or Wo Contrast  Result Date: 07/07/2016 CLINICAL DATA:  Dizziness with left foot and leg numbness low blood pressure EXAM: CT ANGIOGRAPHY CHEST, ABDOMEN AND PELVIS TECHNIQUE: Multidetector CT imaging through the chest, abdomen and pelvis was performed using the standard protocol during bolus administration of intravenous contrast. Multiplanar reconstructed images and MIPs were obtained and reviewed to evaluate the vascular anatomy. CONTRAST:  75 mL Isovue 370 intravenous COMPARISON:  chest x-ray 07/07/2016 FINDINGS: CTA CHEST FINDINGS Cardiovascular: The aorta demonstrates normal caliber and no evidence for aneurysm or dissection. No evidence for mediastinal hematoma. Mild coronary artery calcifications. Heart size normal. No pericardial effusion. Mediastinum/Nodes: No significantly enlarged mediastinal, hilar or axillary lymph nodes. Trachea and mainstem  bronchi appear within normal limits. Thyroid gland is within normal limits. The esophagus is unremarkable. Lungs/Pleura: Mild emphysematous changes within the bilateral upper lobes. No acute infiltrate, pneumothorax, or pleural effusion. Musculoskeletal: No chest wall abnormality. No acute or significant osseous findings. Review of the MIP images confirms the above findings. CTA ABDOMEN AND PELVIS FINDINGS VASCULAR Aorta: Abdominal aorta is non aneurysmal. Irregular mural thrombus and atherosclerotic calcification is present throughout the abdominal aorta. There is no dissection seen. Celiac: Patent SMA: Patent Renals: Single right and left renal arteries which appear without high-grade stenosis. Minimal calcification left renal artery distal to its origin. IMA: Stenosis at the IMA origin.  Normal distal enhancement. Inflow: Moderate diffuse disease of the infrarenal abdominal aorta. 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 Moderate diffuse disease of  the left external iliac artery. There is mild calcification of the right iliac artery without high-grade stenosis. Mild diffuse disease of the right external iliac artery. Femoral arteries at the groin appear grossly patent. Veins: Limited evaluation due to arterial phase. Review of the MIP images confirms the above findings. NON-VASCULAR Hepatobiliary: No focal liver abnormality is seen. No gallstones, gallbladder wall thickening, or biliary dilatation. Pancreas: Unremarkable. No pancreatic ductal dilatation or surrounding inflammatory changes. Spleen: Normal in size without focal abnormality. Adrenals/Urinary Tract: Adrenal glands are unremarkable. Kidneys are normal, without renal calculi, focal lesion, or hydronephrosis. Bladder is unremarkable. Stomach/Bowel: Stomach is within normal limits.  Appendix appears normal. No evidence of bowel wall thickening, distention, or inflammatory changes. Lymphatic: No significantly enlarged pelvic or abdominal lymph nodes. Reproductive: Uterus and bilateral adnexa are unremarkable. Prominent enhancing left greater than right pelvic vessels raises possibility for pelvic congestion. Other: No free air or free fluid Musculoskeletal: No acute or significant osseous findings. Review of the MIP images confirms the above findings. IMPRESSION: 1. No CT evidence for aortic dissection, aortic aneurysm or mediastinal hematoma. 2. 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 There is flow present within the distal left common iliac artery prior to its bifurcation. There is diffuse disease of the left external iliac artery with patency of the imaged proximal left common femoral artery. 3. Mild emphysematous disease within the upper lobes. 4. Prominent left greater than right parauterine enhancing vessels raises possibility of pelvic congestion syndrome. Electronically Signed   By: Donavan Foil M.D.   On: 07/07/2016 18:41    Assessment/Plan 1. Pain of left lower extremity  Recommend:  The patient has atypical pain symptoms for pure atherosclerotic disease. However, on physical exam there is evidence of mixed venous and arterial disease, given the diminished pulses and the edema associated with venous changes of the legs.  Noninvasive studies including ABI's and venous ultrasound of the legs will be obtained and the patient will follow up with me to review these studies.  The patient should continue walking and begin a more formal exercise program. The patient should continue his antiplatelet therapy and aggressive treatment of the lipid abnormalities.  The patient should begin wearing graduated compression socks 15-20 mmHg strength to control  edema.   2. Atherosclerosis of native artery of both lower extremities with intermittent claudication ([place]) See #1 - VAS [name] ABI WITH/WO TBI; Future - VAS US AORTA/IVC/ILIACS; Future  3. Essential hypertension Continue antihypertensive medications as already ordered and reviewed, no changes at this time.  Continue statin as ordered and reviewed, no changes at this time    Hortencia Pilar, MD  07/20/2016 4:01 PM

## 2016-07-21 ENCOUNTER — Encounter: Payer: Self-pay | Admitting: *Deleted

## 2016-07-21 ENCOUNTER — Inpatient Hospital Stay
Admission: EM | Admit: 2016-07-21 | Discharge: 2016-07-23 | DRG: 812 | Disposition: A | Discharge status: Home / Self Care | Payer: BLUE CROSS/BLUE SHIELD | Attending: Specialist | Admitting: Specialist

## 2016-07-21 DIAGNOSIS — F17210 Nicotine dependence, cigarettes, uncomplicated: Secondary | ICD-10-CM | POA: Diagnosis present

## 2016-07-21 DIAGNOSIS — I739 Peripheral vascular disease, unspecified: Secondary | ICD-10-CM | POA: Diagnosis present

## 2016-07-21 DIAGNOSIS — J449 Chronic obstructive pulmonary disease, unspecified: Secondary | ICD-10-CM | POA: Diagnosis present

## 2016-07-21 DIAGNOSIS — Z95828 Presence of other vascular implants and grafts: Secondary | ICD-10-CM

## 2016-07-21 DIAGNOSIS — D649 Anemia, unspecified: Secondary | ICD-10-CM | POA: Diagnosis present

## 2016-07-21 DIAGNOSIS — E785 Hyperlipidemia, unspecified: Secondary | ICD-10-CM | POA: Diagnosis present

## 2016-07-21 DIAGNOSIS — Z7902 Long term (current) use of antithrombotics/antiplatelets: Secondary | ICD-10-CM | POA: Diagnosis not present

## 2016-07-21 DIAGNOSIS — I10 Essential (primary) hypertension: Secondary | ICD-10-CM | POA: Diagnosis present

## 2016-07-21 DIAGNOSIS — Z885 Allergy status to narcotic agent status: Secondary | ICD-10-CM

## 2016-07-21 DIAGNOSIS — Z79899 Other long term (current) drug therapy: Secondary | ICD-10-CM | POA: Diagnosis not present

## 2016-07-21 DIAGNOSIS — Z7982 Long term (current) use of aspirin: Secondary | ICD-10-CM

## 2016-07-21 DIAGNOSIS — L930 Discoid lupus erythematosus: Secondary | ICD-10-CM | POA: Diagnosis not present

## 2016-07-21 DIAGNOSIS — Z23 Encounter for immunization: Secondary | ICD-10-CM | POA: Diagnosis not present

## 2016-07-21 DIAGNOSIS — D589 Hereditary hemolytic anemia, unspecified: Secondary | ICD-10-CM | POA: Diagnosis not present

## 2016-07-21 DIAGNOSIS — R531 Weakness: Secondary | ICD-10-CM | POA: Diagnosis not present

## 2016-07-21 DIAGNOSIS — D591 Other autoimmune hemolytic anemias: Secondary | ICD-10-CM | POA: Diagnosis not present

## 2016-07-21 LAB — COMPREHENSIVE METABOLIC PANEL
ALK PHOS: 58 U/L (ref 38–126)
ALT: 8 U/L — ABNORMAL LOW (ref 14–54)
ANION GAP: 7 (ref 5–15)
AST: 23 U/L (ref 15–41)
Albumin: 3.5 g/dL (ref 3.5–5.0)
BUN: 18 mg/dL (ref 6–20)
CALCIUM: 8.9 mg/dL (ref 8.9–10.3)
CO2: 27 mmol/L (ref 22–32)
Chloride: 106 mmol/L (ref 101–111)
Creatinine, Ser: 1.07 mg/dL — ABNORMAL HIGH (ref 0.44–1.00)
GFR, EST NON AFRICAN AMERICAN: 56 mL/min — AB (ref >60)
Glucose, Bld: 97 mg/dL (ref 65–99)
Potassium: 3.1 mmol/L — ABNORMAL LOW (ref 3.5–5.1)
SODIUM: 140 mmol/L (ref 135–145)
TOTAL PROTEIN: 7.2 g/dL (ref 6.5–8.1)
Total Bilirubin: 3.1 mg/dL — ABNORMAL HIGH (ref 0.3–1.2)

## 2016-07-21 LAB — CBC
HCT: 14.7 % — CL (ref 35.0–47.0)
HEMOGLOBIN: 5.2 g/dL — AB (ref 12.0–16.0)
MCH: 39.3 pg — AB (ref 26.0–34.0)
MCHC: 35.1 g/dL (ref 32.0–36.0)
MCV: 111.7 fL — ABNORMAL HIGH (ref 80.0–100.0)
Platelets: 321 10*3/uL (ref 150–440)
RBC: 1.31 MIL/uL — AB (ref 3.80–5.20)
RDW: 38.3 % — ABNORMAL HIGH (ref 11.5–14.5)
WBC: 9.8 10*3/uL (ref 3.6–11.0)

## 2016-07-21 LAB — FOLATE: FOLATE: 20.5 ng/mL (ref >5.9)

## 2016-07-21 LAB — VITAMIN B12: Vitamin B-12: 203 pg/mL (ref 180–914)

## 2016-07-21 LAB — BILIRUBIN, FRACTIONATED(TOT/DIR/INDIR)
BILIRUBIN DIRECT: 0.4 mg/dL (ref 0.1–0.5)
BILIRUBIN TOTAL: 2.9 mg/dL — AB (ref 0.3–1.2)
Indirect Bilirubin: 2.5 mg/dL — ABNORMAL HIGH (ref 0.3–0.9)

## 2016-07-21 LAB — FERRITIN: FERRITIN: 906 ng/mL — AB (ref 11–307)

## 2016-07-21 LAB — IRON AND TIBC
IRON: 185 ug/dL — AB (ref 28–170)
Saturation Ratios: 70 % — ABNORMAL HIGH (ref 10.4–31.8)
TIBC: 265 ug/dL (ref 250–450)
UIBC: 80 ug/dL

## 2016-07-21 LAB — PREPARE RBC (CROSSMATCH)

## 2016-07-21 LAB — LACTATE DEHYDROGENASE: LDH: 390 U/L — ABNORMAL HIGH (ref 98–192)

## 2016-07-21 LAB — PROTIME-INR
INR: 1.09
PROTHROMBIN TIME: 14.1 s (ref 11.4–15.2)

## 2016-07-21 LAB — RETICULOCYTES
RBC.: 1.32 MIL/uL — ABNORMAL LOW (ref 3.80–5.20)
Retic Count, Absolute: 291.7 10*3/uL — ABNORMAL HIGH (ref 19.0–183.0)
Retic Ct Pct: 22.1 % — ABNORMAL HIGH (ref 0.4–3.1)

## 2016-07-21 LAB — APTT: aPTT: 46 seconds — ABNORMAL HIGH (ref 24–36)

## 2016-07-21 MED ORDER — ACETAMINOPHEN 650 MG RE SUPP: 650.0000 mg | Freq: Four times a day (QID) | RECTAL | Status: DC | PRN

## 2016-07-21 MED ORDER — LISINOPRIL 5 MG PO TABS
5.0000 mg | ORAL_TABLET | Freq: Every day | ORAL | Status: DC
  Filled 2016-07-21: qty 1

## 2016-07-21 MED ORDER — CLOPIDOGREL BISULFATE 75 MG PO TABS
75.0000 mg | ORAL_TABLET | Freq: Every day | ORAL | Status: DC
  Administered 2016-07-22: 10:00:00 75 mg via ORAL
  Filled 2016-07-21: qty 1

## 2016-07-21 MED ORDER — ONDANSETRON HCL 4 MG/2ML IJ SOLN: 4.0000 mg | Freq: Four times a day (QID) | INTRAMUSCULAR | Status: DC | PRN

## 2016-07-21 MED ORDER — PNEUMOCOCCAL VAC POLYVALENT 25 MCG/0.5ML IJ INJ
0.5000 mL | INJECTION | INTRAMUSCULAR | Status: AC
  Administered 2016-07-22: 0.5 mL via INTRAMUSCULAR
  Filled 2016-07-21: qty 0.5

## 2016-07-21 MED ORDER — ACETAMINOPHEN 325 MG PO TABS: 650.0000 mg | ORAL_TABLET | Freq: Four times a day (QID) | ORAL | Status: DC | PRN

## 2016-07-21 MED ORDER — ONDANSETRON HCL 4 MG PO TABS: 4.0000 mg | ORAL_TABLET | Freq: Four times a day (QID) | ORAL | Status: DC | PRN

## 2016-07-21 MED ORDER — ASPIRIN EC 81 MG PO TBEC
81.0000 mg | DELAYED_RELEASE_TABLET | Freq: Every day | ORAL | Status: DC
  Administered 2016-07-22: 81 mg via ORAL
  Filled 2016-07-21: qty 1

## 2016-07-21 MED ORDER — TRAMADOL HCL 50 MG PO TABS
50.0000 mg | ORAL_TABLET | Freq: Four times a day (QID) | ORAL | Status: DC | PRN
  Administered 2016-07-22: 50 mg via ORAL
  Filled 2016-07-21: qty 1

## 2016-07-21 MED ORDER — SODIUM CHLORIDE 0.9 % IV SOLN: Freq: Once | INTRAVENOUS | Status: DC

## 2016-07-21 MED ORDER — DOCUSATE SODIUM 100 MG PO CAPS
100.0000 mg | ORAL_CAPSULE | Freq: Two times a day (BID) | ORAL | Status: DC
  Administered 2016-07-21 – 2016-07-22 (×2): 100 mg via ORAL
  Filled 2016-07-21 (×2): qty 1

## 2016-07-21 MED ORDER — ALBUTEROL SULFATE HFA 108 (90 BASE) MCG/ACT IN AERS: 2.0000 | INHALATION_SPRAY | RESPIRATORY_TRACT | Status: DC | PRN

## 2016-07-21 MED ORDER — ALBUTEROL SULFATE (2.5 MG/3ML) 0.083% IN NEBU: 2.5000 mg | INHALATION_SOLUTION | Freq: Four times a day (QID) | RESPIRATORY_TRACT | Status: DC | PRN

## 2016-07-21 MED ORDER — SODIUM CHLORIDE 0.9 % IV SOLN: 10.0000 mL/h | Freq: Once | INTRAVENOUS | Status: DC

## 2016-07-21 MED ORDER — PRAVASTATIN SODIUM 40 MG PO TABS
40.0000 mg | ORAL_TABLET | Freq: Every evening | ORAL | Status: DC
  Administered 2016-07-21 – 2016-07-22 (×2): 40 mg via ORAL
  Filled 2016-07-21 (×2): qty 1

## 2016-07-21 NOTE — ED Provider Notes (Addendum)
[facility] Emergency Department Provider Note  ____________________________________________   I have reviewed the triage vital signs and the nursing notes.   HISTORY  Chief Complaint hemoglobin 5.3    HPI Tina Page is a 60 y.o. female who presents today complaining of feeling lightheaded when she stands up. Blood work as an outpatient showed the patient had a low hemoglobin and she was sent here. Patient, on 10 November had a angioplasty of the iliacs stents placed. Since that time she has had much better perfusion of her lower extremities. She states however that after the procedure she was noted to be anemic and she did not have a transfusion. Patient also had a negative endoscopy and she is not having any blood or trauma. She states that there is been minimal bruising since the procedure.   Past Medical History:  Diagnosis Date  . COPD (chronic obstructive pulmonary disease) ([place])   . HLD (hyperlipidemia)   . Hypertension     Patient Active Problem List   Diagnosis Date Noted  . Symptomatic anemia 07/21/2016  . Atherosclerosis of native arteries of extremity with intermittent claudication ([place]) 07/20/2016  . PVD (peripheral vascular disease) ([place]) 07/12/2016  . 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 Hepatitis C 07/12/2016  . Hepatitis B 07/12/2016  . Tobacco abuse 07/12/2016  . Anemia 07/07/2016  . Leg pain 07/07/2016  . HTN (hypertension) 07/07/2016  . HLD (hyperlipidemia) 07/07/2016  . COPD (chronic obstructive pulmonary disease) ([place]) 07/07/2016    Past Surgical History:  Procedure Laterality Date  . ESOPHAGOGASTRODUODENOSCOPY (EGD) WITH PROPOFOL N/A 07/08/2016   Procedure: ESOPHAGOGASTRODUODENOSCOPY (EGD) WITH PROPOFOL;  Surgeon: Manya Silvas, MD;  Location: [facility] ENDOSCOPY;  Service: Endoscopy;  Laterality: N/A;  . KNEE SURGERY    .  PERIPHERAL VASCULAR CATHETERIZATION N/A 07/10/2016   Procedure: Lower Extremity Angiography;  Surgeon: Katha Cabal, MD;  Location: [place] CV LAB;  Service: Cardiovascular;  Laterality: N/A;  . PERIPHERAL VASCULAR CATHETERIZATION N/A 07/10/2016   Procedure: Abdominal Aortogram w/Lower Extremity;  Surgeon: Katha Cabal, MD;  Location: [place] CV LAB;  Service: Cardiovascular;  Laterality: N/A;  . PERIPHERAL VASCULAR CATHETERIZATION  07/10/2016   Procedure: Lower Extremity Intervention;  Surgeon: Katha Cabal, MD;  Location: [place] CV LAB;  Service: Cardiovascular;;    Prior to Admission medications   Medication Sig Start Date End Date Taking? Authorizing Provider  albuterol (PROVENTIL HFA;VENTOLIN HFA) 108 (90 Base) MCG/ACT inhaler Inhale 2 puffs into the lungs as needed for wheezing.     Historical Provider, MD  aspirin EC 81 MG tablet Take 81 mg by mouth daily.    Historical Provider, MD  clopidogrel (PLAVIX) 75 MG tablet Take 1 tablet (75 mg total) by mouth daily. 07/13/16   Theodoro Grist, MD  docusate sodium (COLACE) 100 MG capsule Take 1 capsule (100 mg total) by mouth 2 (two) times daily. 07/12/16   Theodoro Grist, MD  lisinopril (PRINIVIL,ZESTRIL) 5 MG tablet Take 5 mg by mouth daily.    Historical Provider, MD  pravastatin (PRAVACHOL) 40 MG tablet Take 40 mg by mouth every evening.    Historical Provider, MD  traMADol (ULTRAM) 50 MG tablet Take 1 tablet (50 mg total) by mouth every 6 (six) hours as needed for moderate pain or severe pain. 07/12/16   Theodoro Grist, MD    Allergies Codeine  Family History  Problem Relation Age of  Onset  . Diabetes Mother   . Hypertension Mother     Social History Social History  Substance Use Topics  . Smoking status: Current Some Day Smoker    Packs/day: 0.50    Types: Cigarettes  . Smokeless tobacco: Never Used  . Alcohol use No    Review of Systems Constitutional: No fever/chills Eyes: No visual  changes. ENT: No sore throat. No stiff neck no neck pain Cardiovascular: Denies chest pain. Respiratory: Denies shortness of breath. Gastrointestinal:   no vomiting.  No diarrhea.  No constipation. Genitourinary: Negative for dysuria. Musculoskeletal: Negative lower extremity swelling Skin: Negative for rash. Neurological: Negative for severe headaches, focal weakness or numbness. 10-point ROS otherwise negative.  ____________________________________________   PHYSICAL EXAM:  VITAL SIGNS: ED Triage Vitals  Enc Vitals Group     BP 07/21/16 1558 (!) 114/59     Pulse Rate 07/21/16 1558 (!) 104     Resp 07/21/16 1558 20     Temp 07/21/16 1558 98.7 F (37.1 C)     Temp Source 07/21/16 1558 Oral     SpO2 07/21/16 1558 98 %     Weight 07/21/16 1554 140 lb (63.5 kg)     Height 07/21/16 1554 5\' 3"  (1.6 m)     Head Circumference --      Peak Flow --      Pain Score --      Pain Loc --      Pain Edu? --      Excl. in [place]? --     Constitutional: Alert and oriented. Well appearing and in no acute distress. Eyes: Conjunctivae are normal. PERRL. EOMI. Head: Atraumatic. Nose: No congestion/rhinnorhea. Mouth/Throat: Mucous membranes are moist.  Oropharynx non-erythematous. Neck: No stridor.   Nontender with no meningismus Cardiovascular: Normal rate, regular rhythm. Grossly normal heart sounds.  Good peripheral circulation. Respiratory: Normal respiratory effort.  No retractions. Lungs CTAB. Abdominal: Soft and nontender. No distention. No guarding no rebound Back:  There is no focal tenderness or step off.  there is no midline tenderness there are no lesions noted. there is no CVA tenderness Rectal exam: Guaiac negative brown stool female chaperone present Musculoskeletal: No lower extremity tenderness, no upper extremity tenderness. No joint effusions, no DVT signs strong distal pulses no edema Neurologic:  Normal speech and language. No gross focal neurologic deficits are  appreciated.  Skin:  She is pale, there is minimal bruising to the right leg which appears to have been old. There is no bruit or thrill to the iliacs. Psychiatric: Mood and affect are normal. Speech and behavior are normal.  ____________________________________________   LABS (all labs ordered are listed, but only abnormal results are displayed)  Labs Reviewed  COMPREHENSIVE METABOLIC PANEL - Abnormal; Notable for the following:       Result Value   Potassium 3.1 (*)    Creatinine, Ser 1.07 (*)    ALT 8 (*)    Total Bilirubin 3.1 (*)    GFR calc non Af Amer 56 (*)    All other components within normal limits  CBC - Abnormal; Notable for the following:    RBC 1.31 (*)    Hemoglobin 5.2 (*)    HCT 14.7 (*)    MCV 111.7 (*)    MCH 39.3 (*)    RDW 38.3 (*)    All other components within normal limits  APTT - Abnormal; Notable for the following:    aPTT 46 (*)    All other  components within normal limits  PROTIME-INR  VITAMIN B12  FOLATE  IRON AND TIBC  FERRITIN  RETICULOCYTES  CBC  LACTATE DEHYDROGENASE  BILIRUBIN, FRACTIONATED(TOT/DIR/INDIR)  HAPTOGLOBIN  TYPE AND SCREEN  PREPARE RBC (CROSSMATCH)   ____________________________________________  EKG  I personally interpreted any EKGs ordered by me or triage  ____________________________________________  RADIOLOGY  I reviewed any imaging ordered by me or triage that were performed during my shift and, if possible, patient and/or family made aware of any abnormal findings. ____________________________________________   PROCEDURES  Procedure(s) performed: None  Procedures  Critical Care performed: None  ____________________________________________   INITIAL IMPRESSION / ASSESSMENT AND PLAN / ED COURSE  Pertinent labs & imaging results that were available during my care of the patient were reviewed by me and considered in my medical decision making (see chart for details).  Patient here with low  hemoglobin which is symptomatic.  Patient will likely need transfusion. She is not bleeding from her rectum had negative recent endoscopy. Unclear where the bleeding is coming from or if there is other pathology. Patient did have outpatient follow-up with hematology but has not yet seen them. No evidence of active bleeding around the area of recent surgery, & her feet are well-perfused.  ----------------------------------------- 5:53 PM on 07/21/2016 -----------------------------------------  I d/w dr. Delana Meyer, who agrees with the management and admission. Hospitalist also agrees with admission.  Clinical Course    ____________________________________________   FINAL CLINICAL IMPRESSION(S) / ED DIAGNOSES  Final diagnoses:  None      This chart was dictated using voice recognition software.  Despite best efforts to proofread,  errors can occur which can change meaning.      Schuyler Amor, MD 07/21/16 [place], MD 07/21/16 [phone]

## 2016-07-21 NOTE — H&P (Signed)
[place] at [place] NAME: Tina Page    MR#:  [id number]  DATE OF BIRTH:  [birth year]   DATE OF ADMISSION:  07/21/2016  PRIMARY CARE PHYSICIAN: Frazier Richards, MD   REQUESTING/REFERRING PHYSICIAN: McShane  CHIEF COMPLAINT:   Chief Complaint  Patient presents with  . hemoglobin 5.3  Weakness  HISTORY OF PRESENT ILLNESS:  Tina Page  is a 60 y.o. female with a known history of Essential hypertension, hepatitis back in the 64s who is presenting with weakness. Recently underwent peripheral vascular stenting, follow-up PCP check blood count noted to Be Anemic., Hospital further workup and evaluation.  She's been complaining of weakness, fatigue denies any bruising or bleeding.   PAST MEDICAL HISTORY:   Past Medical History:  Diagnosis Date  . COPD (chronic obstructive pulmonary disease) ([place])   . HLD (hyperlipidemia)   . Hypertension     PAST SURGICAL HISTORY:   Past Surgical History:  Procedure Laterality Date  . ESOPHAGOGASTRODUODENOSCOPY (EGD) WITH PROPOFOL N/A 07/08/2016   Procedure: ESOPHAGOGASTRODUODENOSCOPY (EGD) WITH PROPOFOL;  Surgeon: Manya Silvas, MD;  Location: [facility] ENDOSCOPY;  Service: Endoscopy;  Laterality: N/A;  . KNEE SURGERY    . PERIPHERAL VASCULAR CATHETERIZATION N/A 07/10/2016   Procedure: Lower Extremity Angiography;  Surgeon: Katha Cabal, MD;  Location: [place] CV LAB;  Service: Cardiovascular;  Laterality: N/A;  . PERIPHERAL VASCULAR CATHETERIZATION N/A 07/10/2016   Procedure: Abdominal Aortogram w/Lower Extremity;  Surgeon: Katha Cabal, MD;  Location: [place] [facility] LAB;  Service: Cardiovascular;  Laterality: N/A;  . PERIPHERAL VASCULAR CATHETERIZATION  07/10/2016   Procedure: Lower Extremity Intervention;  Surgeon: Katha Cabal, MD;  Location: [place] CV LAB;  Service: Cardiovascular;;    SOCIAL HISTORY:   Social History  Substance Use Topics  . Smoking  status: Current Some Day Smoker    Packs/day: 0.50    Types: Cigarettes  . Smokeless tobacco: Never Used  . Alcohol use No    FAMILY HISTORY:   Family History  Problem Relation Age of Onset  . Diabetes Mother   . Hypertension Mother     DRUG ALLERGIES:   Allergies  Allergen Reactions  . Codeine Anaphylaxis    REVIEW OF SYSTEMS:  REVIEW OF SYSTEMS:  CONSTITUTIONAL: Denies fevers, chills,Positive fatigue, weakness.  EYES: Denies blurred vision, double vision, or eye pain.  EARS, NOSE, THROAT: Denies tinnitus, ear pain, hearing loss.  RESPIRATORY: denies cough, shortness of breath, wheezing  CARDIOVASCULAR: Denies chest pain, palpitations, edema.  GASTROINTESTINAL: Denies nausea, vomiting, diarrhea, abdominal pain.  GENITOURINARY: Denies dysuria, hematuria.  ENDOCRINE: Denies nocturia or thyroid problems. HEMATOLOGIC AND LYMPHATIC: Denies easy bruising or bleeding.  SKIN: Denies rash or lesions.  MUSCULOSKELETAL: Denies pain in neck, back, shoulder, knees, hips, or further arthritic symptoms.  NEUROLOGIC: Denies paralysis, paresthesias.  PSYCHIATRIC: Denies anxiety or depressive symptoms. Otherwise full review of systems performed by me is negative.   MEDICATIONS AT HOME:   Prior to Admission medications   Medication Sig Start Date End Date Taking? Authorizing Provider  aspirin EC 81 MG tablet Take 81 mg by mouth daily.   Yes Historical Provider, MD  clopidogrel (PLAVIX) 75 MG tablet Take 1 tablet (75 mg total) by mouth daily. 07/13/16  Yes Theodoro Grist, MD  docusate sodium (COLACE) 100 MG capsule Take 1 capsule (100 mg total) by mouth 2 (two) times daily. 07/12/16  Yes Theodoro Grist, MD  lisinopril (PRINIVIL,ZESTRIL) 5 MG tablet Take 5 mg  by mouth daily.   Yes Historical Provider, MD  pravastatin (PRAVACHOL) 40 MG tablet Take 40 mg by mouth every evening.   Yes Historical Provider, MD  traMADol (ULTRAM) 50 MG tablet Take 1 tablet (50 mg total) by mouth every 6 (six)  hours as needed for moderate pain or severe pain. 07/12/16  Yes Theodoro Grist, MD      VITAL SIGNS:  Blood pressure (!) 113/49, pulse 86, temperature 98.7 F (37.1 C), temperature source Oral, resp. rate 13, height 5\' 3"  (1.6 m), weight 63.5 kg (140 lb), SpO2 100 %.  PHYSICAL EXAMINATION:  VITAL SIGNS: Vitals:   07/21/16 1630 07/21/16 1730  BP: (!) 112/55 (!) 113/49  Pulse: 90 86  Resp: 16 13  Temp:     GENERAL:60 y.o.female currently in no acute distress.  HEAD: Normocephalic, atraumatic.  EYES: Pupils equal, round, reactive to light. Extraocular muscles intact. Positive scleral icterus.  MOUTH: Moist mucosal membrane. Dentition intact. No abscess noted. Positive sublingual jaundice EAR, NOSE, THROAT: Clear without exudates. No external lesions.  NECK: Supple. No thyromegaly. No nodules. No JVD.  PULMONARY: Clear to ascultation, without wheeze rails or rhonci. No use of accessory muscles, Good respiratory effort. good air entry bilaterally CHEST: Nontender to palpation.  CARDIOVASCULAR: S1 and S2. Regular rate and rhythm. No murmurs, rubs, or gallops. No edema. Pedal pulses 2+ bilaterally.  GASTROINTESTINAL: Soft, nontender, nondistended. No masses. Positive bowel sounds. No hepatosplenomegaly.  MUSCULOSKELETAL: No swelling, clubbing, or edema. Range of motion full in all extremities.  NEUROLOGIC: Cranial nerves II through XII are intact. No gross focal neurological deficits. Sensation intact. Reflexes intact.  SKIN: Jaundice, No ulceration, lesions, rashes, or cyanosis. Skin warm and dry. Turgor intact.  PSYCHIATRIC: Mood, affect within normal limits. The patient is awake, alert and oriented x 3. Insight, judgment intact.    LABORATORY PANEL:   CBC  Recent Labs Lab 07/21/16 1557  WBC 9.8  HGB 5.2*  HCT 14.7*  PLT 321   ------------------------------------------------------------------------------------------------------------------  Chemistries   Recent Labs Lab  07/21/16 1557  NA 140  K 3.1*  CL 106  CO2 27  GLUCOSE 97  BUN 18  CREATININE 1.07*  CALCIUM 8.9  AST 23  ALT 8*  ALKPHOS 58  BILITOT 3.1*   ------------------------------------------------------------------------------------------------------------------  Cardiac Enzymes No results for input(s): TROPONINI in the last 168 hours. ------------------------------------------------------------------------------------------------------------------  RADIOLOGY:  No results found.  EKG:   Orders placed or performed during the hospital encounter of 07/07/16  . ED EKG  . ED EKG  . EKG 12-Lead  . EKG 12-Lead  . ED EKG 12-Lead  . ED EKG 12-Lead  . EKG 12-Lead  . EKG 12-Lead  . EKG    IMPRESSION AND PLAN:   60 year old Caucasian female history of hepatitis presenting with symptomatic anemia  1. 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 Peripheral vascular disease continue aspirin and Plavix 3. Hyperlipidemia unspecified statin therapy 4. Hypertension essential lisinopril    All the records are reviewed and case discussed with ED provider. Management plans discussed with the patient, family and they are in agreement.  CODE STATUS: Full  TOTAL TIME TAKING CARE OF THIS PATIENT: 33 minutes.    Salmaan Patchin,  Karenann Cai.D on 07/21/2016 at 6:03 PM  Between 7am to 6pm - Pager - [phone]  After  6pm: House Pager: - [phone]  [facility]  Office  [phone]  CC: Primary care physician; Frazier Richards, MD

## 2016-07-21 NOTE — ED Triage Notes (Signed)
Pt was discharged from an inpatient stay of bilateral stent placement in legs, pt hemoglobin at MD office today 5.3, pt pale , pt reports being tired

## 2016-07-22 DIAGNOSIS — D591 Other autoimmune hemolytic anemias: Secondary | ICD-10-CM

## 2016-07-22 DIAGNOSIS — L930 Discoid lupus erythematosus: Secondary | ICD-10-CM

## 2016-07-22 LAB — CBC
HEMATOCRIT: 19.2 % — AB (ref 35.0–47.0)
HEMOGLOBIN: 6.7 g/dL — AB (ref 12.0–16.0)
MCH: 36.2 pg — ABNORMAL HIGH (ref 26.0–34.0)
MCHC: 34.8 g/dL (ref 32.0–36.0)
MCV: 104.1 fL — AB (ref 80.0–100.0)
Platelets: 278 10*3/uL (ref 150–440)
RBC: 1.84 MIL/uL — ABNORMAL LOW (ref 3.80–5.20)
RDW: 20.4 % — AB (ref 11.5–14.5)
WBC: 9.8 10*3/uL (ref 3.6–11.0)

## 2016-07-22 LAB — HAPTOGLOBIN

## 2016-07-22 MED ORDER — POTASSIUM CHLORIDE 20 MEQ PO PACK
40.0000 meq | PACK | Freq: Once | ORAL | Status: AC
  Administered 2016-07-22: 16:00:00 40 meq via ORAL
  Filled 2016-07-22: qty 2

## 2016-07-22 NOTE — Progress Notes (Signed)
[place] at [place] NAME: Tina Page    MR#:  [id number]  DATE OF BIRTH:  [birth year]  SUBJECTIVE:   Pt. Here due to symptomatic anemia.  Hg. Improved slightly today. Feels less weak and dizzy today. Daughter at bedside.  Awaiting further blood transfusion.   REVIEW OF SYSTEMS:    Review of Systems  Constitutional: Negative for chills and fever.  HENT: Negative for congestion and tinnitus.   Eyes: Negative for blurred vision and double vision.  Respiratory: Negative for cough, shortness of breath and wheezing.   Cardiovascular: Negative for chest pain, orthopnea and PND.  Gastrointestinal: Negative for abdominal pain, diarrhea, nausea and vomiting.  Genitourinary: Negative for dysuria and hematuria.  Neurological: Positive for dizziness and weakness. Negative for sensory change and focal weakness.  All other systems reviewed and are negative.   Nutrition: Heart healthy Tolerating Diet: Yes Tolerating PT: ambulatory     DRUG ALLERGIES:   Allergies  Allergen Reactions  . Codeine Anaphylaxis    VITALS:  Blood pressure (!) 161/69, pulse 78, temperature 98.7 F (37.1 C), temperature source Oral, resp. rate 18, height 5\' 3"  (1.6 m), weight 63 kg (139 lb), SpO2 99 %.  PHYSICAL EXAMINATION:   Physical Exam  GENERAL:  60 y.o.-year-old patient lying in the bed in no acute distress.  EYES: Pupils equal, round, reactive to light and accommodation. No scleral icterus. Extraocular muscles intact. Pale conjunctiva HEENT: Head atraumatic, normocephalic. Oropharynx and nasopharynx clear.  NECK:  Supple, no jugular venous distention. No thyroid enlargement, no tenderness.  LUNGS: Normal breath sounds bilaterally, no wheezing, rales, rhonchi. No use of accessory muscles of respiration.  CARDIOVASCULAR: S1, S2 normal. No murmurs, rubs, or gallops.  ABDOMEN: Soft, nontender, nondistended. Bowel sounds present. No organomegaly or mass.   EXTREMITIES: No cyanosis, clubbing or edema b/l.    NEUROLOGIC: Cranial nerves II through XII are intact. No focal Motor or sensory deficits b/l.   PSYCHIATRIC: The patient is alert and oriented x 3.  SKIN: No obvious rash, lesion, or ulcer.    LABORATORY PANEL:   CBC  Recent Labs Lab 07/22/16 0444  WBC 9.8  HGB 6.7*  HCT 19.2*  PLT 278   ------------------------------------------------------------------------------------------------------------------  Chemistries   Recent Labs Lab 07/21/16 1557  NA 140  K 3.1*  CL 106  CO2 27  GLUCOSE 97  BUN 18  CREATININE 1.07*  CALCIUM 8.9  AST 23  ALT 8*  ALKPHOS 58  BILITOT 2.9*  3.1*   ------------------------------------------------------------------------------------------------------------------  Cardiac Enzymes No results for input(s): TROPONINI in the last 168 hours. ------------------------------------------------------------------------------------------------------------------  RADIOLOGY:  No results found.   ASSESSMENT AND PLAN:   60 year old female with past medical history of retention, hyperlipidemia, COPD, peripheral vascular disease status post recent stent placement to the right and left common iliac arteries who presented to the hospital due to weakness, dizziness secondary to symptomatic anemia.  1. Symptomatic Anemia - cause of pt's weakness, dizziness.  - suspected to be hemolytic in nature and cold agglutinin.  - s/p 1 unit transfusion and will get 2 more units today.  - await Hematology input.   2. HTN - cont. Lisinopril.    3. Hyperlipidemia - cont. Pravachol.   4. COPD - no acute exacerbation.  - cont. PRN Nebs.   5. Hx of PVD - cont. ASA, Plavix, Statin.    All the records are reviewed and case discussed with Care Management/Social Worker. Management plans discussed with the patient,  family and they are in agreement.  CODE STATUS: Full code  DVT Prophylaxis: TED's & SCD's.    TOTAL TIME TAKING CARE OF THIS PATIENT: 30 minutes.   POSSIBLE D/C IN 1-2 DAYS, DEPENDING ON CLINICAL CONDITION.   Henreitta Leber M.D on 07/22/2016 at 1:40 PM  Between 7am to 6pm - Pager - [phone]  After 6pm go to [email or web address] - Proofreader  [facility]  Office  [phone]  CC: Primary care physician; Frazier Richards, MD

## 2016-07-23 LAB — TYPE AND SCREEN
ABO/RH(D): A NEG
ANTIBODY SCREEN: POSITIVE
DAT, IGG: POSITIVE
DAT, complement: POSITIVE
UNIT DIVISION: 0
UNIT DIVISION: 0
Unit division: 0

## 2016-07-23 LAB — CBC
HEMATOCRIT: 28.4 % — AB (ref 35.0–47.0)
Hemoglobin: 10 g/dL — ABNORMAL LOW (ref 12.0–16.0)
MCH: 34.4 pg — ABNORMAL HIGH (ref 26.0–34.0)
MCHC: 35.3 g/dL (ref 32.0–36.0)
MCV: 97.3 fL (ref 80.0–100.0)
Platelets: 233 10*3/uL (ref 150–440)
RBC: 2.92 MIL/uL — ABNORMAL LOW (ref 3.80–5.20)
RDW: 16.5 % — AB (ref 11.5–14.5)
WBC: 8.9 10*3/uL (ref 3.6–11.0)

## 2016-07-23 NOTE — Consult Note (Signed)
  Consult requested for hemolytic anemia  Patient was seen on 07/22/2016 at 430 pm. This note is entered late on 07/23/2016 at 10:00 am.  She had been seen by Dr. 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 She has a H/o discoid lupus.  She had not yet been seen by hepatology, she present with hgb 6.5. She has been transfused 2 units so far and is doing well  She is seen ambulating freely with no Sob or chest pain. Says she ha deen feeling dizzy earlier but improved today She was instructed to stay warm and had been given blankets but she says she felt too warm and sweaty and removed them. She denison nay pain.  Exam shows stable vitals.  Hgb is 6.7 Platelets and wbc are normal  Exam shows pallor, no icterus, no petechiae or visible extremal signs of bleeding  No peripheral edema. 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 Mike Gip.  For now agree with blood transfusion as needed, use blood warmer. Also counselled the patient to keep herself warm,  wear scarf, ear muffs and socks and mittens to warm all food items and liquids to room temperature before consuming them.  This should reduce hemolysis.  If her hgb does not improve with transfusion or falls too quick after transfsuion, we will consider Rituxan but only after suppressive therapy but only if hep B/C is ruled out.  I discussed her case with Dr. Rogue Bussing on 07/22/2016 about 5 pm. Dr. Rogue Bussing will follow over the holidays, I have signed off.

## 2016-07-23 NOTE — Discharge Summary (Signed)
[place] at [place] NAME: Tina Page    MR#:  [id number]  DATE OF BIRTH:  11/18/1955  DATE OF ADMISSION:  07/21/2016 ADMITTING PHYSICIAN: Lytle Butte, MD  DATE OF DISCHARGE: 07/23/2016 10:20 AM  PRIMARY CARE PHYSICIAN: Frazier Richards, MD    ADMISSION DIAGNOSIS:  5.3 Hemoglobin  DISCHARGE DIAGNOSIS:  Active Problems:   Symptomatic anemia   SECONDARY DIAGNOSIS:   Past Medical History:  Diagnosis Date  . COPD (chronic obstructive pulmonary disease) ([place])   . HLD (hyperlipidemia)   . Hypertension     HOSPITAL COURSE:   60 year old female with past medical history of retention, hyperlipidemia, COPD, peripheral vascular disease status post recent stent placement to the right and left common iliac arteries who presented to the hospital due to weakness, dizziness secondary to symptomatic anemia.  1. Symptomatic Anemia - this was the cause of pt's weakness, dizziness.  -This was a hemolytic anemia given her elevated bilirubin, low haptoglobin and elevated LDH. -Patient was given a total of 3 units of packed red blood cells. Hemoglobin is improved to 10.0 upon discharge. She is clinically asymptomatic now. -She will follow-up with Hem/Onc for further treatment of her hemolytic anemia.   2. HTN - she will cont. Lisinopril.    3. Hyperlipidemia - she will cont. Pravachol.   4. COPD - no acute exacerbation.   5. Hx of PVD s/p recent b/l Common femoral stent placement. - cont. Plavix, Statin.   DISCHARGE CONDITIONS:   Stable.   CONSULTS OBTAINED:  Treatment Team:  Lytle Butte, MD Cammie Sickle, MD  DRUG ALLERGIES:   Allergies  Allergen Reactions  . Codeine Anaphylaxis    DISCHARGE MEDICATIONS:     Medication List    STOP taking these medications   aspirin EC 81 MG tablet     TAKE these medications   clopidogrel 75 MG tablet Commonly known as:  PLAVIX Take 1 tablet (75 mg total) by mouth daily.   docusate sodium 100 MG capsule Commonly known as:  COLACE Take 1 capsule (100 mg total) by mouth 2 (two) times daily.   lisinopril 5 MG tablet Commonly known as:  PRINIVIL,ZESTRIL Take 5 mg by mouth daily.   pravastatin 40 MG tablet Commonly known as:  PRAVACHOL Take 40 mg by mouth every evening.   traMADol 50 MG tablet Commonly known as:  ULTRAM Take 1 tablet (50 mg total) by mouth every 6 (six) hours as needed for moderate pain or severe pain.         DISCHARGE INSTRUCTIONS:   DIET:  Cardiac diet  DISCHARGE CONDITION:  Stable  ACTIVITY:  Activity as tolerated  OXYGEN:  Home Oxygen: No.   Oxygen Delivery: room air  DISCHARGE LOCATION:  home   If you experience worsening of your admission symptoms, develop shortness of breath, life threatening emergency, suicidal or homicidal thoughts you must seek medical attention immediately by calling 911 or calling your MD immediately  if symptoms less severe.  You Must read complete instructions/literature along with all the possible adverse reactions/side effects for all the Medicines you take and that have been prescribed to you. Take any new Medicines after you have completely understood and accpet all the possible adverse reactions/side effects.   Please note  You were cared for by a hospitalist during your hospital stay. If you have any questions about your discharge medications or the care you received while you were in the hospital after you  are discharged, you can call the unit and asked to speak with the hospitalist on call if the hospitalist that took care of you is not available. Once you are discharged, your primary care physician will handle any further medical issues. Please note that NO REFILLS for any discharge medications will be authorized once you are discharged, as it is imperative that you return to your primary care physician (or establish a relationship with a primary care physician if you do not have one)  for your aftercare needs so that they can reassess your need for medications and monitor your lab values.     Today   Feels better today.  Hg. Improved.  No dizziness, weakness, or shortness of breath.    VITAL SIGNS:  Blood pressure (!) 141/68, pulse 77, temperature 98.7 F (37.1 C), temperature source Oral, resp. rate 16, height 5\' 3"  (1.6 m), weight 63 kg (139 lb), SpO2 96 %.  I/O:   Intake/Output Summary (Last 24 hours) at 07/23/16 1414 Last data filed at 07/23/16 0800  Gross per 24 hour  Intake             1420 ml  Output                0 ml  Net             1420 ml    PHYSICAL EXAMINATION:  GENERAL:  61 y.o.-year-old patient lying in the bed with no acute distress.  EYES: Pupils equal, round, reactive to light and accommodation. No scleral icterus. Extraocular muscles intact.  HEENT: Head atraumatic, normocephalic. Oropharynx and nasopharynx clear.  NECK:  Supple, no jugular venous distention. No thyroid enlargement, no tenderness.  LUNGS: Normal breath sounds bilaterally, no wheezing, rales,rhonchi. No use of accessory muscles of respiration.  CARDIOVASCULAR: S1, S2 normal. No murmurs, rubs, or gallops.  ABDOMEN: Soft, non-tender, non-distended. Bowel sounds present. No organomegaly or mass.  EXTREMITIES: No pedal edema, cyanosis, or clubbing.  NEUROLOGIC: Cranial nerves II through XII are intact. No focal motor or sensory defecits b/l.  PSYCHIATRIC: The patient is alert and oriented x 3. Good affect.  SKIN: No obvious rash, lesion, or ulcer.   DATA REVIEW:   CBC  Recent Labs Lab 07/23/16 0428  WBC 8.9  HGB 10.0*  HCT 28.4*  PLT 233    Chemistries   Recent Labs Lab 07/21/16 1557  NA 140  K 3.1*  CL 106  CO2 27  GLUCOSE 97  BUN 18  CREATININE 1.07*  CALCIUM 8.9  AST 23  ALT 8*  ALKPHOS 58  BILITOT 2.9*  3.1*    Cardiac Enzymes No results for input(s): TROPONINI in the last 168 hours.  RADIOLOGY:  No results found.    Management plans  discussed with the patient, family and they are in agreement.  CODE STATUS:  Code Status History    Date Active Date Inactive Code Status Order ID Comments User Context   07/21/2016  5:22 PM 07/23/2016  1:55 PM Full Code [id number]  Lytle Butte, MD ED   07/07/2016 10:29 PM 07/12/2016  2:51 PM Full Code [id number]  Lance Coon, MD ED      TOTAL TIME TAKING CARE OF THIS PATIENT: 40 minutes.    Henreitta Leber M.D on 07/23/2016 at 2:14 PM  Between 7am to 6pm - Pager - [phone]  After 6pm go to [email or web address] - [facility]  Office  [phone]  CC: Primary care physician; Jiles Garter  Lars Masson, MD

## 2016-07-23 NOTE — Progress Notes (Signed)
Discussed discharge instructions and medications with pt and her daughter. IV removed. All questions addressed. Pt transported home via car by her daughter.  Danielle Pina Sirianni, RN 

## 2016-07-26 ENCOUNTER — Telehealth: Payer: Self-pay | Admitting: Internal Medicine

## 2016-07-26 NOTE — Telephone Encounter (Signed)
I contacted Anyla's daughter. She states her mother is doing well an has had no symptoms of excessive SOB. I have recommended that she should bring her mother to the cancer center tomorrow- Monday 07/27/2016 at 9 AM for a stat CBC check and see Md. I also urged her to keep warm. And to report to [facility] if symptoms of excess Sob, fatigue or dizzy spells occur before then. She will relay this message to her mom, Latonyia did not answer the phone call. I have sent a message to scheduling as well.

## 2016-07-27 ENCOUNTER — Other Ambulatory Visit: Payer: Self-pay | Admitting: *Deleted

## 2016-07-27 ENCOUNTER — Telehealth: Payer: Self-pay | Admitting: *Deleted

## 2016-07-27 ENCOUNTER — Inpatient Hospital Stay: Payer: BLUE CROSS/BLUE SHIELD | Attending: Hematology and Oncology

## 2016-07-27 DIAGNOSIS — L930 Discoid lupus erythematosus: Secondary | ICD-10-CM | POA: Insufficient documentation

## 2016-07-27 DIAGNOSIS — D5912 Cold autoimmune hemolytic anemia: Secondary | ICD-10-CM

## 2016-07-27 DIAGNOSIS — E785 Hyperlipidemia, unspecified: Secondary | ICD-10-CM | POA: Diagnosis not present

## 2016-07-27 DIAGNOSIS — D591 Other autoimmune hemolytic anemias: Principal | ICD-10-CM

## 2016-07-27 DIAGNOSIS — J449 Chronic obstructive pulmonary disease, unspecified: Secondary | ICD-10-CM | POA: Insufficient documentation

## 2016-07-27 DIAGNOSIS — D890 Polyclonal hypergammaglobulinemia: Secondary | ICD-10-CM | POA: Diagnosis not present

## 2016-07-27 DIAGNOSIS — Z955 Presence of coronary angioplasty implant and graft: Secondary | ICD-10-CM | POA: Insufficient documentation

## 2016-07-27 DIAGNOSIS — Z79899 Other long term (current) drug therapy: Secondary | ICD-10-CM | POA: Insufficient documentation

## 2016-07-27 DIAGNOSIS — I10 Essential (primary) hypertension: Secondary | ICD-10-CM | POA: Insufficient documentation

## 2016-07-27 DIAGNOSIS — I708 Atherosclerosis of other arteries: Secondary | ICD-10-CM | POA: Insufficient documentation

## 2016-07-27 DIAGNOSIS — F17210 Nicotine dependence, cigarettes, uncomplicated: Secondary | ICD-10-CM | POA: Insufficient documentation

## 2016-07-27 LAB — CBC WITH DIFFERENTIAL/PLATELET
BASOS ABS: 0.2 10*3/uL — AB (ref 0–0.1)
BASOS PCT: 3 %
EOS ABS: 0.3 10*3/uL (ref 0–0.7)
EOS PCT: 5 %
HEMATOCRIT: 27.4 % — AB (ref 35.0–47.0)
Hemoglobin: 9.9 g/dL — ABNORMAL LOW (ref 12.0–16.0)
Lymphocytes Relative: 19 %
Lymphs Abs: 1 10*3/uL (ref 1.0–3.6)
MCH: 33.7 pg (ref 26.0–34.0)
MCHC: 36 g/dL (ref 32.0–36.0)
MCV: 93.8 fL (ref 80.0–100.0)
MONO ABS: 0.4 10*3/uL (ref 0.2–0.9)
Monocytes Relative: 8 %
NEUTROS ABS: 3.6 10*3/uL (ref 1.4–6.5)
Neutrophils Relative %: 65 %
PLATELETS: 200 10*3/uL (ref 150–440)
RBC: 2.92 MIL/uL — ABNORMAL LOW (ref 3.80–5.20)
RDW: 19.1 % — AB (ref 11.5–14.5)
WBC: 5.5 10*3/uL (ref 3.6–11.0)

## 2016-07-27 LAB — SAMPLE TO BLOOD BANK

## 2016-07-27 NOTE — Telephone Encounter (Signed)
Called to ask if pt could come today to have blood work and they were already told by on call md to be here at 9 am.

## 2016-07-27 NOTE — Telephone Encounter (Signed)
-----   Message from Lequita Asal, MD sent at 07/27/2016  4:37 PM EST ----- Regarding: Please call patient  Counts ok  M ----- Message ----- From: Cammie Sickle, MD Sent: 07/27/2016   4:13 PM To: Lequita Asal, MD    ----- Message ----- From: Buel Ream, [facility] [place] Sent: 07/27/2016   9:18 AM To: Cammie Sickle, MD

## 2016-07-27 NOTE — Telephone Encounter (Signed)
Called pt to let her know that she did not need blood transfusion today and she will need to keep her appt this week on Thursday. She is agreeable to the plan

## 2016-07-27 NOTE — Telephone Encounter (Signed)
Called patient to inform her that her labs look okay. Patient asked if she should keep appointment for Thursday.  Advised her to keep appointment.

## 2016-07-30 ENCOUNTER — Inpatient Hospital Stay: Payer: BLUE CROSS/BLUE SHIELD | Admitting: *Deleted

## 2016-07-30 ENCOUNTER — Encounter: Payer: Self-pay | Admitting: Hematology and Oncology

## 2016-07-30 ENCOUNTER — Inpatient Hospital Stay (HOSPITAL_BASED_OUTPATIENT_CLINIC_OR_DEPARTMENT_OTHER): Payer: BLUE CROSS/BLUE SHIELD | Admitting: Hematology and Oncology

## 2016-07-30 VITALS — BP 118/72 | HR 87 | Temp 98.5°F | Resp 18 | Wt 142.0 lb

## 2016-07-30 DIAGNOSIS — D890 Polyclonal hypergammaglobulinemia: Secondary | ICD-10-CM

## 2016-07-30 DIAGNOSIS — D591 Other autoimmune hemolytic anemias: Principal | ICD-10-CM

## 2016-07-30 DIAGNOSIS — D598 Other acquired hemolytic anemias: Secondary | ICD-10-CM

## 2016-07-30 DIAGNOSIS — D5912 Cold autoimmune hemolytic anemia: Secondary | ICD-10-CM

## 2016-07-30 DIAGNOSIS — L930 Discoid lupus erythematosus: Secondary | ICD-10-CM | POA: Diagnosis not present

## 2016-07-30 DIAGNOSIS — Z79899 Other long term (current) drug therapy: Secondary | ICD-10-CM

## 2016-07-30 DIAGNOSIS — F17210 Nicotine dependence, cigarettes, uncomplicated: Secondary | ICD-10-CM | POA: Diagnosis not present

## 2016-07-30 LAB — SAMPLE TO BLOOD BANK

## 2016-07-30 LAB — COMPREHENSIVE METABOLIC PANEL
ALT: 8 U/L — ABNORMAL LOW (ref 14–54)
AST: 20 U/L (ref 15–41)
Albumin: 4 g/dL (ref 3.5–5.0)
Alkaline Phosphatase: 60 U/L (ref 38–126)
Anion gap: 5 (ref 5–15)
BUN: 19 mg/dL (ref 6–20)
CO2: 28 mmol/L (ref 22–32)
Calcium: 9.1 mg/dL (ref 8.9–10.3)
Chloride: 105 mmol/L (ref 101–111)
Creatinine, Ser: 1.05 mg/dL — ABNORMAL HIGH (ref 0.44–1.00)
GFR calc Af Amer: >60 mL/min (ref >60)
GFR calc non Af Amer: 57 mL/min — ABNORMAL LOW (ref >60)
Glucose, Bld: 92 mg/dL (ref 65–99)
Potassium: 4.1 mmol/L (ref 3.5–5.1)
Sodium: 138 mmol/L (ref 135–145)
Total Bilirubin: 2.6 mg/dL — ABNORMAL HIGH (ref 0.3–1.2)
Total Protein: 7.4 g/dL (ref 6.5–8.1)

## 2016-07-30 LAB — CBC WITH DIFFERENTIAL/PLATELET
Basophils Absolute: 0.2 10*3/uL — ABNORMAL HIGH (ref 0–0.1)
Basophils Relative: 3 %
Eosinophils Absolute: 0.4 10*3/uL (ref 0–0.7)
Eosinophils Relative: 6 %
HCT: 21.4 % — ABNORMAL LOW (ref 35.0–47.0)
Hemoglobin: 7.6 g/dL — ABNORMAL LOW (ref 12.0–16.0)
Lymphocytes Relative: 17 %
Lymphs Abs: 1.3 10*3/uL (ref 1.0–3.6)
MCH: 35.6 pg — ABNORMAL HIGH (ref 26.0–34.0)
MCHC: 35.7 g/dL (ref 32.0–36.0)
MCV: 99.6 fL (ref 80.0–100.0)
Monocytes Absolute: 0.7 10*3/uL (ref 0.2–0.9)
Monocytes Relative: 9 %
Neutro Abs: 5.2 10*3/uL (ref 1.4–6.5)
Neutrophils Relative %: 65 %
Platelets: 223 10*3/uL (ref 150–440)
RBC: 2.14 MIL/uL — ABNORMAL LOW (ref 3.80–5.20)
RDW: 19.4 % — ABNORMAL HIGH (ref 11.5–14.5)
WBC: 7.8 10*3/uL (ref 3.6–11.0)

## 2016-07-30 LAB — RETICULOCYTES
RBC.: 2.14 MIL/uL — ABNORMAL LOW (ref 3.80–5.20)
Retic Count, Absolute: 318.9 10*3/uL — ABNORMAL HIGH (ref 19.0–183.0)
Retic Ct Pct: 14.9 % — ABNORMAL HIGH (ref 0.4–3.1)

## 2016-07-30 LAB — DAT, POLYSPECIFIC AHG (ARMC ONLY)
DAT, IgG: POSITIVE
DAT, complement: POSITIVE
Polyspecific AHG test: POSITIVE

## 2016-07-30 LAB — LACTATE DEHYDROGENASE: LDH: 355 U/L — ABNORMAL HIGH (ref 98–192)

## 2016-07-30 NOTE — Progress Notes (Signed)
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  HPI:  The patient was in her usual state of good health until 07/02/2016 when she felt "crappy". She describes feeling like she was "coming down with something".  She noted feeling sluggish. She denied any fevers chills or sweats.  She denied any runny nose , sore throat, cough or congestion. She had no rash.  On 07/05/2016, she states that she stated that the bottom of her left foot was hurting.  She felt dizzy and nauseated. She had some diarrhea. She denied any melena or hematochezia.  She notes that since the spring, she had to stop periodically when walking due to pain and cramping in her legs.  She denies any new medications or herbal products. She notes a history of hepatitis as a teenager.  She denies any fevers, sweats or weight loss.  She was admitted to [facility] from from 07/07/2016 - 07/12/2016.  She was diagnosed with a cold autoimmune hemolytic anemia.  CBC on admission included a hematocrit of 24.0 hemoglobin 8.3.  CBC on 07/08/2016 revealed a hematocrit of 21 and hemoglobin of 7.3.  (Prior CBC on 06/11/2014 revealed a hematocrit of 46.5, hemoglobin 15.4, MCV 92, platelets 127,000 and white count 6100.).  She received 1 unit of warmed PRBCs.  Anemia workup revealed revealed a cold autoantibody (IgG and complement).  Reticulocyte count was 11.3%.  Ferritin was 562.  Iron studies revealed a saturation of 20% and a TIBC of 214 (low).  B12 was 231 (low normal).  Folate was 42.   Additional testing included the following + studies:  hepatitis C antibody, hepatitis B core antibody, CMV IgM, and EBV VCA (IgM and IgG).  Hepatitis B by PCR was negative.  Hepatitis C RNA was negative.  Mycoplasma pneumonia IgM was negative.  Reticulocyte count was 11.3% (high) indicating appropriate  marrow response.  C3 and C4 were normal.  Cold agglutinin titer was negative.  Negative studies included:  ANA, hepatitis B surface antigen, SPEP, and free light chain ratio.   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  MMA 330 (normal) thus ruling out B12 deficiency.  Initial testing revealed a guaiac positive stool.  She had undergone EGD and colonoscopy at [facility] on 03/19/2015.  EGD revealed gastritis in the body and antrum. There was flattened mucosa in the duodenum. Pathology was negative for celiac disease. Colonoscopy revealed one hyperplastic polyp and internal hemorrhoids.  She underwent EGD on 07/08/2016 by Dr. Gustavo Lah.  Esophagus was normal. There was scalloped mucosa consistent with celiac disease. Plan was for capsule endoscopy.  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 There was flow present within the distal left common iliac artery prior to its bifurcation. There was diffuse disease of the left external iliac artery with patency of the imaged proximal left common femoral artery.  There was no adenopathy.  Spleen was normal.  She underwent PTCA and stent placement in right and left common iliac arteries and left external iliac artery on 07/10/2016 by Dr. Delana Meyer.  She noted improvement of left lower extremity pain.  It was recommended that she continue dual antiplatelet therapy with aspirin and Plavix and continue her statin drug.  She was readmitted to [facility] from 07/21/2016 - 07/23/2016.  She presented with symptomatic anemia.  Hemoglobin was 5.3.  Labs included an elevated bilirubin (2.9; indirect 2.5), low haptoglobin (< 10), and elevated LDH (390) c/w hemolysis.  Folate was 20.5 (normal).  Reticulocyte count was 22.1%.  PTT was 46 (24-36).  She was transfused with 3  units of PRBCs.  CBC on discharge included a hematocrit of 28.4 and hemoglobin 10.  CBC on 07/27/2016 revealed a hematocrit of 27.4, hemoglobin 9.9, platelets 200,000, WBC 5500 with an [place] of 3600.  Symptomatically, she notes fatigue.  She denies any fevers or sweats.  Weight is stable although her daughter states that she "eats like a bird".  She has a "head cold".  She notes the onset of a chest rash which she relates to her blood count going down.   Past Medical History:  Diagnosis Date  . COPD (chronic obstructive pulmonary disease) ([place])   . HLD (hyperlipidemia)   . Hypertension     Past Surgical History:  Procedure Laterality Date  . ESOPHAGOGASTRODUODENOSCOPY (EGD) WITH PROPOFOL N/A 07/08/2016   Procedure: ESOPHAGOGASTRODUODENOSCOPY (EGD) WITH PROPOFOL;  Surgeon: Manya Silvas, MD;  Location[facility] ENDOSCOPY;  Service: Endoscopy;  Laterality: N/A;  . KNEE SURGERY    . PERIPHERAL VASCULAR CATHETERIZATION N/A 07/10/2016   Procedure: Lower Extremity Angiography;  Surgeon: Katha Cabal, MD;  Location: [place] CV LAB;  Service: Cardiovascular;  Laterality: N/A;  . PERIPHERAL VASCULAR CATHETERIZATION N/A 07/10/2016   Procedure: Abdominal Aortogram w/Lower Extremity;  Surgeon: Katha Cabal, MD;  Location: [place] CV LAB;  Service: Cardiovascular;  Laterality: N/A;  . PERIPHERAL VASCULAR CATHETERIZATION  07/10/2016   Procedure: Lower Extremity Intervention;  Surgeon: Katha Cabal, MD;  Location: [place] CV LAB;  Service: Cardiovascular;;    Family History  Problem Relation Age of Onset  . Diabetes Mother   . Hypertension Mother   . Diabetes Maternal Grandfather   . Hypertension Maternal Grandfather     Social History:  reports that she has been smoking Cigarettes.  She has been smoking about 0.50 packs per day. She has never used smokeless tobacco. She reports that she does not drink alcohol or use drugs.  The patient works at [employer].  She is exposed to cold temperatures.  She has a daughter named, [name].  She is accompanied by her oldest daughter, Amy, today.  Allergies:  Allergies  Allergen Reactions  . Codeine Anaphylaxis    Current Medications: Current Outpatient Prescriptions  Medication Sig Dispense Refill  . albuterol (PROVENTIL HFA;VENTOLIN HFA) 108 (90 Base) MCG/ACT inhaler Inhale into the lungs.    . clopidogrel (PLAVIX) 75 MG tablet Take 1 tablet (75 mg total) by mouth daily. 30 tablet 5  . docusate sodium (COLACE) 100 MG capsule Take 1 capsule (100 mg total) by mouth 2 (two) times daily. 10 capsule 0  . lisinopril (PRINIVIL,ZESTRIL) 5 MG tablet Take 5 mg by mouth daily.    . pravastatin (PRAVACHOL) 40 MG tablet Take 40 mg by mouth every evening.    . traMADol (ULTRAM) 50 MG tablet Take 1 tablet (50 mg total) by mouth every 6 (six) hours as needed for moderate pain or severe pain. 30 tablet 0  . potassium chloride SA (K-DUR,KLOR-CON) 20 MEQ tablet Take by mouth.     No current facility-administered medications for this visit.     Review of Systems:  GENERAL: Fatigued. No fevers, sweats or weight loss. PERFORMANCE STATUS (  ECOG): 1 HEENT: "Head cold".  No visual changes, sore throat, mouth sores or tenderness. Lungs: No shortness of breath or cough. No hemoptysis. Cardiac: No chest pain, palpitations, orthopnea, or PND. GI: Appetite poor.  No nausea, vomiting, diarrhea, constipation, melena or hematochezia. GU: No urgency, frequency, dysuria, or hematuria. Musculoskeletal: No back pain. No joint pain. No muscle tenderness. Extremities: Left foot numbness resolved after stent palcement. No swelling. Skin: Discoid lupus. Rash on chest.  No rashes or skin changes. Neuro: No headache, weakness, balance or coordination issues. Endocrine: No diabetes, thyroid issues, hot flashes or night sweats. Psych: No mood changes, depression or anxiety. Pain: No focal pain. Review of  systems: All other systems reviewed and found to be negative.  Physical Exam: Blood pressure 118/72, pulse 87, temperature 98.5 F (36.9 C), temperature source Tympanic, resp. rate 18, weight 141 lb 15.6 oz (64.4 kg). GENERAL:Well developed, well nourished, slightly pale woman sitting comfortably in the exam room in no acute distress. MENTAL STATUS: Alert and oriented to person, place and time. HEAD:Long gray hair. Normocephalic, atraumatic, face symmetric, no Cushingoid features. EYES:Glasses.  Blue eyes.  Pupils equal round and reactive to light and accomodation. No conjunctivitis or scleral icterus. [id number] clear without lesion. Tonguenormal. Mucous membranes moist. RESPIRATORY:Clear to auscultationwithout rales, wheezes or rhonchi. CARDIOVASCULAR:Regular rate andrhythmwithout murmur, rub or gallop. ABDOMEN:Soft, non-tender, with active bowel sounds, and no hepatosplenomegaly. No masses. SKIN: Livedo reticularis across chest. EXTREMITIES: No edema, no skin discoloration or tenderness. No palpable cords. LYMPHNODES: No palpable cervical, supraclavicular, axillary or inguinal adenopathy  NEUROLOGICAL: Unremarkable   No visits with results within 3 Day(s) from this visit.  Latest known visit with results is:  Appointment on 07/27/2016  Component Date Value Ref Range Status  . WBC 07/27/2016 5.5  3.6 - 11.0 K/uL Final  . RBC 07/27/2016 2.92* 3.80 - 5.20 MIL/uL Final  . Hemoglobin 07/27/2016 9.9* 12.0 - 16.0 g/dL Final  . HCT 07/27/2016 27.4* 35.0 - 47.0 % Final  . MCV 07/27/2016 93.8  80.0 - 100.0 fL Final  . MCH 07/27/2016 33.7  26.0 - 34.0 pg Final  . MCHC 07/27/2016 36.0  32.0 - 36.0 g/dL Final  . RDW 07/27/2016 19.1* 11.5 - 14.5 % Final  . Platelets 07/27/2016 200  150 - 440 K/uL Final  . Neutrophils Relative % 07/27/2016 65  % Final  . Neutro Abs 07/27/2016 3.6  1.4 - 6.5 K/uL Final  . Lymphocytes Relative 07/27/2016 19  % Final  . Lymphs Abs  07/27/2016 1.0  1.0 - 3.6 K/uL Final  . Monocytes Relative 07/27/2016 8  % Final  . Monocytes Absolute 07/27/2016 0.4  0.2 - 0.9 K/uL Final  . Eosinophils Relative 07/27/2016 5  % Final  . Eosinophils Absolute 07/27/2016 0.3  0 - 0.7 K/uL Final  . Basophils Relative 07/27/2016 3  % Final  . Basophils Absolute 07/27/2016 0.2* 0 - 0.1 K/uL Final  . Blood Bank Specimen 07/27/2016 SAMPLE AVAILABLE FOR TESTING   Final  . 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 One week prior to presentation, she felt like she was coming down with something. She denied any fever, runny nose, sore throat or cough. She had some diarrhea. She denied any new medications or herbal products.  Anemia workup revealed revealed a cold autoantibody (IgG and complement).  Reticulocyte count was 11.3%.  Ferritin was 562.  Iron studies revealed a saturation of 20% and a TIBC of 214 (low).  B12 was 231 (low normal).  Folate was 42.   Peripheral smear revealed rouleaux formation.  Additional testing included the following + studies:  hepatitis C antibody, hepatitis B core antibody, CMV IgM, and EBV VCA (IgM and IgG).  Hepatitis B by PCR was negative.  Hepatitis C RNA was negative.  Mycoplasma pneumonia IgM was negative.  Reticulocyte count was 11.3% (high) indicating appropriate marrow response.  C3 and C4 were normal.  Cold agglutinin titer was negative.  Negative studies included:  ANA, hepatitis B surface antigen, SPEP, and free light chain ratio.   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  MMA 330 (normal) thus ruling out B12 deficiency.  Chest, abdomen, and pelvis CT angiogram on 07/07/2016 revealed moderate diffuse atherosclerotic vascular disease of the abdominal aorta with severe stenosis of the left common iliac artery  with suspected short segment occlusion. There was no adenopathy. Spleen was normal.  She underwent PTCA and stent placement in right and left common iliac arteries and left external iliac artery on 07/10/2016.  EGD on 03/19/2015 revealed gastritis in the body and antrum. Colonoscopy on 03/19/2015 revealed one hyperplastic polyp. EGD on 07/08/2016 was normal.  No evidence of bleeding.  She has received 4 units of warmed PRBCs to date.  Hematocrit has dropped from 27.4 to 21.4 in the past 3 days.  Exam reveals livedo reticularis.  Plan: 1.  Labs today:  CBC with diff, CMP, LDH, retic, [place], cryoglobulin.  2.  Discuss cold autoantibody.  Typical etiologies include infection (mycoplasmal pneumonia, EBV, CMV), autoimmune disease, and malignancy (lymphoma). She has a history of discoid lupus. Imaging studies reveal no evidence of adenopathy or splenomegaly. She has tried to stay in a warm environment.  Hemolysis is ongoing.  Work-up to date include + hepatitis C antibody, + hepatitis B core antibody, + CMV IgM, and EBV VCA  (IgM and IgG).  Unusual having numerous + results.  Suspect potential interference of testing from cold autoantibody.  Consider trial of steroids (prednisone 1 mg/kg/day versus Decadron 40 mg po x 4 days) to stop hemolysis.  Discuss issues with use of Rituxan in patients with hepatitis B (reactivation).  May need to pursue Rituxan with antiviral coverage (entecavir or tenofovir).  Other options include Cytoxan.  3.  RTC in 1 week for MD assessment and labs (CBC with diff, hold tube +/- others).  Addendum:  The patient was contacted regarding her lab results and plan for transfusion.  Per blood bank, may take 1-2 days.  She will be contacted when blood is available.   Lequita Asal, MD  07/30/2016, 4:31 PM

## 2016-07-30 NOTE — Progress Notes (Signed)
Patient is here as a new patient  

## 2016-07-31 ENCOUNTER — Inpatient Hospital Stay: Payer: BLUE CROSS/BLUE SHIELD | Attending: Hematology and Oncology | Admitting: Hematology and Oncology

## 2016-07-31 ENCOUNTER — Other Ambulatory Visit: Payer: Self-pay | Admitting: *Deleted

## 2016-07-31 DIAGNOSIS — E538 Deficiency of other specified B group vitamins: Secondary | ICD-10-CM | POA: Insufficient documentation

## 2016-07-31 DIAGNOSIS — B1910 Unspecified viral hepatitis B without hepatic coma: Secondary | ICD-10-CM | POA: Diagnosis not present

## 2016-07-31 DIAGNOSIS — I10 Essential (primary) hypertension: Secondary | ICD-10-CM | POA: Diagnosis not present

## 2016-07-31 DIAGNOSIS — D5912 Cold autoimmune hemolytic anemia: Secondary | ICD-10-CM

## 2016-07-31 DIAGNOSIS — B1920 Unspecified viral hepatitis C without hepatic coma: Secondary | ICD-10-CM | POA: Diagnosis not present

## 2016-07-31 DIAGNOSIS — E785 Hyperlipidemia, unspecified: Secondary | ICD-10-CM | POA: Diagnosis not present

## 2016-07-31 DIAGNOSIS — Z87891 Personal history of nicotine dependence: Secondary | ICD-10-CM | POA: Insufficient documentation

## 2016-07-31 DIAGNOSIS — D591 Other autoimmune hemolytic anemias: Secondary | ICD-10-CM | POA: Diagnosis present

## 2016-07-31 DIAGNOSIS — Z955 Presence of coronary angioplasty implant and graft: Secondary | ICD-10-CM | POA: Insufficient documentation

## 2016-07-31 DIAGNOSIS — L930 Discoid lupus erythematosus: Secondary | ICD-10-CM | POA: Diagnosis not present

## 2016-07-31 DIAGNOSIS — J449 Chronic obstructive pulmonary disease, unspecified: Secondary | ICD-10-CM | POA: Diagnosis not present

## 2016-07-31 DIAGNOSIS — D890 Polyclonal hypergammaglobulinemia: Secondary | ICD-10-CM | POA: Insufficient documentation

## 2016-07-31 DIAGNOSIS — D598 Other acquired hemolytic anemias: Secondary | ICD-10-CM

## 2016-07-31 DIAGNOSIS — Z79899 Other long term (current) drug therapy: Secondary | ICD-10-CM | POA: Diagnosis not present

## 2016-08-01 ENCOUNTER — Emergency Department
Admission: EM | Admit: 2016-08-01 | Discharge: 2016-08-01 | Discharge status: Absent without leave | Payer: BLUE CROSS/BLUE SHIELD

## 2016-08-01 ENCOUNTER — Encounter: Payer: Self-pay | Admitting: Hematology and Oncology

## 2016-08-01 ENCOUNTER — Inpatient Hospital Stay
Admission: AD | Admit: 2016-08-01 | Discharge: 2016-08-02 | DRG: 810 | Disposition: A | Discharge status: Home / Self Care | Payer: BLUE CROSS/BLUE SHIELD | Source: Ambulatory Visit | Attending: Internal Medicine | Admitting: Internal Medicine

## 2016-08-01 DIAGNOSIS — I10 Essential (primary) hypertension: Secondary | ICD-10-CM | POA: Diagnosis present

## 2016-08-01 DIAGNOSIS — Z833 Family history of diabetes mellitus: Secondary | ICD-10-CM

## 2016-08-01 DIAGNOSIS — Z79899 Other long term (current) drug therapy: Secondary | ICD-10-CM

## 2016-08-01 DIAGNOSIS — D591 Other autoimmune hemolytic anemias: Secondary | ICD-10-CM | POA: Diagnosis present

## 2016-08-01 DIAGNOSIS — D598 Other acquired hemolytic anemias: Secondary | ICD-10-CM

## 2016-08-01 DIAGNOSIS — E785 Hyperlipidemia, unspecified: Secondary | ICD-10-CM | POA: Diagnosis present

## 2016-08-01 DIAGNOSIS — Z885 Allergy status to narcotic agent status: Secondary | ICD-10-CM | POA: Diagnosis not present

## 2016-08-01 DIAGNOSIS — J449 Chronic obstructive pulmonary disease, unspecified: Secondary | ICD-10-CM | POA: Diagnosis present

## 2016-08-01 DIAGNOSIS — Z7902 Long term (current) use of antithrombotics/antiplatelets: Secondary | ICD-10-CM | POA: Diagnosis not present

## 2016-08-01 DIAGNOSIS — F17210 Nicotine dependence, cigarettes, uncomplicated: Secondary | ICD-10-CM | POA: Diagnosis not present

## 2016-08-01 DIAGNOSIS — D5912 Cold autoimmune hemolytic anemia: Secondary | ICD-10-CM | POA: Diagnosis present

## 2016-08-01 DIAGNOSIS — D890 Polyclonal hypergammaglobulinemia: Secondary | ICD-10-CM | POA: Diagnosis not present

## 2016-08-01 DIAGNOSIS — Z87891 Personal history of nicotine dependence: Secondary | ICD-10-CM

## 2016-08-01 DIAGNOSIS — Z8249 Family history of ischemic heart disease and other diseases of the circulatory system: Secondary | ICD-10-CM | POA: Diagnosis not present

## 2016-08-01 DIAGNOSIS — D599 Acquired hemolytic anemia, unspecified: Secondary | ICD-10-CM | POA: Diagnosis present

## 2016-08-01 DIAGNOSIS — L930 Discoid lupus erythematosus: Secondary | ICD-10-CM | POA: Diagnosis not present

## 2016-08-01 HISTORY — DX: Anemia, unspecified: D64.9

## 2016-08-01 LAB — CBC WITH DIFFERENTIAL/PLATELET
Band Neutrophils: 5 %
Basophils Absolute: 0.1 K/uL (ref 0–0.1)
Basophils Relative: 1 %
Blasts: 0 %
Eosinophils Absolute: 0.3 K/uL (ref 0–0.7)
Eosinophils Relative: 4 %
HCT: 16.4 % — ABNORMAL LOW (ref 35.0–47.0)
Hemoglobin: 6.1 g/dL — ABNORMAL LOW (ref 12.0–16.0)
Lymphocytes Relative: 29 %
Lymphs Abs: 2 K/uL (ref 1.0–3.6)
MCH: 38.6 pg — ABNORMAL HIGH (ref 26.0–34.0)
MCHC: 37 g/dL — ABNORMAL HIGH (ref 32.0–36.0)
MCV: 104.3 fL — ABNORMAL HIGH (ref 80.0–100.0)
Metamyelocytes Relative: 1 %
Monocytes Absolute: 0.5 K/uL (ref 0.2–0.9)
Monocytes Relative: 7 %
Myelocytes: 0 %
Neutro Abs: 4 K/uL (ref 1.4–6.5)
Neutrophils Relative %: 53 %
Other: 0 %
Platelets: 218 K/uL (ref 150–440)
Promyelocytes Absolute: 0 %
RBC: 1.57 MIL/uL — ABNORMAL LOW (ref 3.80–5.20)
RDW: 21.7 % — ABNORMAL HIGH (ref 11.5–14.5)
WBC: 6.9 K/uL (ref 3.6–11.0)
nRBC: 2 /100{WBCs} — ABNORMAL HIGH

## 2016-08-01 LAB — COMPREHENSIVE METABOLIC PANEL
ALBUMIN: 3.5 g/dL (ref 3.5–5.0)
ALT: 8 U/L — ABNORMAL LOW (ref 14–54)
ANION GAP: 7 (ref 5–15)
AST: 20 U/L (ref 15–41)
Alkaline Phosphatase: 58 U/L (ref 38–126)
BUN: 20 mg/dL (ref 6–20)
CHLORIDE: 102 mmol/L (ref 101–111)
CO2: 28 mmol/L (ref 22–32)
Calcium: 8.5 mg/dL — ABNORMAL LOW (ref 8.9–10.3)
Creatinine, Ser: 1.07 mg/dL — ABNORMAL HIGH (ref 0.44–1.00)
GFR calc Af Amer: >60 mL/min (ref >60)
GFR, EST NON AFRICAN AMERICAN: 56 mL/min — AB (ref >60)
Glucose, Bld: 95 mg/dL (ref 65–99)
POTASSIUM: 3.7 mmol/L (ref 3.5–5.1)
Sodium: 137 mmol/L (ref 135–145)
Total Bilirubin: 2.5 mg/dL — ABNORMAL HIGH (ref 0.3–1.2)
Total Protein: 6.6 g/dL (ref 6.5–8.1)

## 2016-08-01 LAB — PREPARE RBC (CROSSMATCH)

## 2016-08-01 LAB — RETICULOCYTES
RBC.: 1.57 MIL/uL — AB (ref 3.80–5.20)
RETIC COUNT ABSOLUTE: 303 10*3/uL — AB (ref 19.0–183.0)
RETIC CT PCT: 19.3 % — AB (ref 0.4–3.1)

## 2016-08-01 LAB — LACTATE DEHYDROGENASE: LDH: 329 U/L — ABNORMAL HIGH (ref 98–192)

## 2016-08-01 MED ORDER — FOLIC ACID 1 MG PO TABS
1.0000 mg | ORAL_TABLET | Freq: Every day | ORAL | Status: DC
  Administered 2016-08-01 – 2016-08-02 (×2): 1 mg via ORAL
  Filled 2016-08-01 (×2): qty 1

## 2016-08-01 MED ORDER — DIPHENHYDRAMINE HCL 25 MG PO CAPS
25.0000 mg | ORAL_CAPSULE | Freq: Once | ORAL | Status: AC
  Administered 2016-08-01: 19:00:00 25 mg via ORAL
  Filled 2016-08-01: qty 1

## 2016-08-01 MED ORDER — HEPARIN SOD (PORK) LOCK FLUSH 100 UNIT/ML IV SOLN: 250.0000 [IU] | INTRAVENOUS | Status: DC | PRN

## 2016-08-01 MED ORDER — TRAMADOL HCL 50 MG PO TABS: 50.0000 mg | ORAL_TABLET | Freq: Four times a day (QID) | ORAL | Status: DC | PRN

## 2016-08-01 MED ORDER — DOCUSATE SODIUM 100 MG PO CAPS
100.0000 mg | ORAL_CAPSULE | Freq: Two times a day (BID) | ORAL | Status: DC
  Administered 2016-08-02: 10:00:00 100 mg via ORAL
  Filled 2016-08-01 (×2): qty 1

## 2016-08-01 MED ORDER — METHYLPREDNISOLONE SODIUM SUCC 125 MG IJ SOLR
60.0000 mg | INTRAMUSCULAR | Status: DC
  Administered 2016-08-01: 60 mg via INTRAVENOUS
  Filled 2016-08-01: qty 2

## 2016-08-01 MED ORDER — LISINOPRIL 5 MG PO TABS
5.0000 mg | ORAL_TABLET | Freq: Every day | ORAL | Status: DC
  Administered 2016-08-02: 5 mg via ORAL
  Filled 2016-08-01: qty 1

## 2016-08-01 MED ORDER — ALBUTEROL SULFATE (2.5 MG/3ML) 0.083% IN NEBU: 3.0000 mL | INHALATION_SOLUTION | RESPIRATORY_TRACT | Status: DC | PRN

## 2016-08-01 MED ORDER — PRAVASTATIN SODIUM 40 MG PO TABS
40.0000 mg | ORAL_TABLET | Freq: Every evening | ORAL | Status: DC
  Administered 2016-08-01: 18:00:00 40 mg via ORAL
  Filled 2016-08-01: qty 1

## 2016-08-01 MED ORDER — SODIUM CHLORIDE 0.9 % IV SOLN
250.0000 mL | Freq: Once | INTRAVENOUS | Status: AC
  Administered 2016-08-01: 20:00:00 250 mL via INTRAVENOUS

## 2016-08-01 MED ORDER — ACETAMINOPHEN 325 MG PO TABS
650.0000 mg | ORAL_TABLET | Freq: Once | ORAL | Status: AC
  Administered 2016-08-01: 650 mg via ORAL
  Filled 2016-08-01: qty 2

## 2016-08-01 MED ORDER — SODIUM CHLORIDE 0.9% FLUSH: 3.0000 mL | INTRAVENOUS | Status: DC | PRN

## 2016-08-01 MED ORDER — HEPARIN SOD (PORK) LOCK FLUSH 100 UNIT/ML IV SOLN
500.0000 [IU] | Freq: Every day | INTRAVENOUS | Status: DC | PRN
Start: 2016-08-01 — End: 2016-08-02

## 2016-08-01 MED ORDER — SODIUM CHLORIDE 0.9% FLUSH: 10.0000 mL | INTRAVENOUS | Status: DC | PRN

## 2016-08-01 NOTE — H&P (Addendum)
[place] [facility] [place] NAME: Tina Page    MR#:  [id number]  DATE OF BIRTH:  1955/10/27  DATE OF ADMISSION:  08/01/2016  PRIMARY CARE PHYSICIAN: Frazier Richards, MD   REQUESTING/REFERRING PHYSICIAN: Dr. Mike Gip  CHIEF COMPLAINT:  Anemia  HISTORY OF PRESENT ILLNESS:  Tina Page  is a 60 y.o. female with a known history of autoimmune hemolytic anemia, COPD, hypertension and hyperlipidemia is sent over to the hospital as a direct admit for low hemoglobin. Patient denies any chest pain or shortness of breath while resting. Reporting exertional dyspnea. No other complaints. patient was recently admitted to [facility] on November 21 with similar complaint and cord blood transfusion  PAST MEDICAL HISTORY:   Past Medical History:  Diagnosis Date  . Anemia   . COPD (chronic obstructive pulmonary disease) ([place])   . HLD (hyperlipidemia)   . Hypertension     PAST SURGICAL HISTOIRY:   Past Surgical History:  Procedure Laterality Date  . ESOPHAGOGASTRODUODENOSCOPY (EGD) WITH PROPOFOL N/A 07/08/2016   Procedure: ESOPHAGOGASTRODUODENOSCOPY (EGD) WITH PROPOFOL;  Surgeon: Manya Silvas, MD;  Location: [facility] ENDOSCOPY;  Service: Endoscopy;  Laterality: N/A;  . KNEE SURGERY    . PERIPHERAL VASCULAR CATHETERIZATION N/A 07/10/2016   Procedure: Lower Extremity Angiography;  Surgeon: Katha Cabal, MD;  Location: [place] CV LAB;  Service: Cardiovascular;  Laterality: N/A;  . PERIPHERAL VASCULAR CATHETERIZATION N/A 07/10/2016   Procedure: Abdominal Aortogram w/Lower Extremity;  Surgeon: Katha Cabal, MD;  Location: [place] CV LAB;  Service: Cardiovascular;  Laterality: N/A;  . PERIPHERAL VASCULAR CATHETERIZATION  07/10/2016   Procedure: Lower Extremity Intervention;  Surgeon: Katha Cabal, MD;  Location: [place] CV LAB;  Service: Cardiovascular;;    SOCIAL HISTORY:   Social History  Substance Use Topics  .  Smoking status: Former Smoker    Packs/day: 0.50    Types: Cigarettes    Quit date: 07/11/2016  . Smokeless tobacco: Never Used  . Alcohol use No    FAMILY HISTORY:   Family History  Problem Relation Age of Onset  . Diabetes Mother   . Hypertension Mother   . Diabetes Maternal Grandfather   . Hypertension Maternal Grandfather     DRUG ALLERGIES:   Allergies  Allergen Reactions  . Codeine Anaphylaxis    REVIEW OF SYSTEMS:  CONSTITUTIONAL: No fever, fatigue or weakness.  EYES: No blurred or double vision.  EARS, NOSE, AND THROAT: No tinnitus or ear pain.  RESPIRATORY: No cough, shortness of breathWhile resting, denies wheezing or hemoptysis.  CARDIOVASCULAR: No chest pain, orthopnea, edema.  GASTROINTESTINAL: No nausea, vomiting, diarrhea or abdominal pain.  GENITOURINARY: No dysuria, hematuria.  ENDOCRINE: No polyuria, nocturia,  HEMATOLOGY: No anemia, easy bruising or bleeding SKIN: No rash or lesion. MUSCULOSKELETAL: No joint pain or arthritis.   NEUROLOGIC: No tingling, numbness, weakness.  PSYCHIATRY: No anxiety or depression.   MEDICATIONS AT HOME:   Prior to Admission medications   Medication Sig Start Date End Date Taking? Authorizing Provider  clopidogrel (PLAVIX) 75 MG tablet Take 1 tablet (75 mg total) by mouth daily. 07/13/16  Yes Theodoro Grist, MD  lisinopril (PRINIVIL,ZESTRIL) 5 MG tablet Take 5 mg by mouth daily.   Yes Historical Provider, MD  pravastatin (PRAVACHOL) 40 MG tablet Take 40 mg by mouth every evening.   Yes Historical Provider, MD  traMADol (ULTRAM) 50 MG tablet Take 1 tablet (50 mg total) by mouth every 6 (six) hours as needed  for moderate pain or severe pain. 07/12/16  Yes Theodoro Grist, MD  albuterol (PROVENTIL HFA;VENTOLIN HFA) 108 (90 Base) MCG/ACT inhaler Inhale into the lungs.    Historical Provider, MD  docusate sodium (COLACE) 100 MG capsule Take 1 capsule (100 mg total) by mouth 2 (two) times daily. Patient not taking: Reported on  08/01/2016 07/12/16   Theodoro Grist, MD  potassium chloride SA (K-DUR,KLOR-CON) 20 MEQ tablet Take by mouth.    Historical Provider, MD      VITAL SIGNS:  There were no vitals taken for this visit.  PHYSICAL EXAMINATION:  GENERAL:  60 y.o.-year-old patient lying in the bed with no acute distress.  EYES: Pupils equal, round, reactive to light and accommodation. No scleral icterus. Extraocular muscles intact.  HEENT: Head atraumatic, normocephalic. Oropharynx and nasopharynx clear.  NECK:  Supple, no jugular venous distention. No thyroid enlargement, no tenderness.  LUNGS: Normal breath sounds bilaterally, no wheezing, rales,rhonchi or crepitation. No use of accessory muscles of respiration.  CARDIOVASCULAR: S1, S2 normal. No murmurs, rubs, or gallops.  ABDOMEN: Soft, nontender, nondistended. Bowel sounds present. No organomegaly or mass.  EXTREMITIES: No pedal edema, cyanosis, or clubbing.  NEUROLOGIC: Cranial nerves II through XII are intact. Muscle strength 5/5 in all extremities. Sensation intact. Gait not checked.  PSYCHIATRIC: The patient is alert and oriented x 3.  SKIN: No obvious rash, lesion, or ulcer.   LABORATORY PANEL:   CBC  Recent Labs Lab 07/30/16 1646  WBC 7.8  HGB 7.6*  HCT 21.4*  PLT 223   ------------------------------------------------------------------------------------------------------------------  Chemistries   Recent Labs Lab 07/30/16 1646  NA 138  K 4.1  CL 105  CO2 28  GLUCOSE 92  BUN 19  CREATININE 1.05*  CALCIUM 9.1  AST 20  ALT 8*  ALKPHOS 60  BILITOT 2.6*   ------------------------------------------------------------------------------------------------------------------  Cardiac Enzymes No results for input(s): TROPONINI in the last 168 hours. ------------------------------------------------------------------------------------------------------------------  RADIOLOGY:  No results found.  EKG:   Orders placed or performed  during the hospital encounter of 07/07/16  . ED EKG  . ED EKG  . EKG 12-Lead  . EKG 12-Lead  . ED EKG 12-Lead  . ED EKG 12-Lead  . EKG 12-Lead  . EKG 12-Lead  . EKG    IMPRESSION AND PLAN:   Tina Page  is a 60 y.o. female with a known history of autoimmune hemolytic anemia, COPD, hypertension and hyperlipidemia is sent over to the hospital as a direct admit for low hemoglobin. Patient denies any chest pain or shortness of breath while resting. Reporting exertional dyspnea. No other complaints  #Acute on chronic anemia secondary to autoimmune hemolytic anemia Admit to MedSurg unit CBC, CMP, type and screen were ordered Consult is placed to Dr. Mike Gip, she will review the labs and order the blood transfusion Monitor hemoglobin and hematocrit closely Patient needs premedication with the Tylenol, Benadryl and Solu-Medrol prior to blood transfusion Patient's Plavix is on hold in view of acute on chronic anemia  #Chronic history of COPD no acute exacerbation provide nebulizer treatments as needed  #Essential hypertension Stable. Continue home medication lisinopril  #Hyperlipidemia continue statin    DVT prophylaxis with SCDs  All the records are reviewed and case discussed with ED provider. Management plans discussed with the patient, daughter and they are in agreement.  CODE STATUS: Full code, all 3 daughters are the healthcare power of attorney  TOTAL TIME TAKING CARE OF THIS PATIENT:  41 minutes.   Note: This dictation was prepared with  Dragon dictation along with smaller Company secretary. Any transcriptional errors that result from this process are unintentional.  Nicholes Mango M.D on 08/01/2016 at 5:30 PM  Between 7am to 6pm - Pager - [phone]  After 6pm go to [email or web address] - password [email or web address] [place] [facility]  Office  [phone]  CC: Primary care physician; Frazier Richards, MD

## 2016-08-01 NOTE — Progress Notes (Signed)
Per Dr Patsy Baltimore waiting on MD clearance to give blood and premeds

## 2016-08-02 DIAGNOSIS — I10 Essential (primary) hypertension: Secondary | ICD-10-CM

## 2016-08-02 DIAGNOSIS — F17210 Nicotine dependence, cigarettes, uncomplicated: Secondary | ICD-10-CM

## 2016-08-02 DIAGNOSIS — D890 Polyclonal hypergammaglobulinemia: Secondary | ICD-10-CM

## 2016-08-02 DIAGNOSIS — Z955 Presence of coronary angioplasty implant and graft: Secondary | ICD-10-CM

## 2016-08-02 DIAGNOSIS — Z79899 Other long term (current) drug therapy: Secondary | ICD-10-CM

## 2016-08-02 DIAGNOSIS — J449 Chronic obstructive pulmonary disease, unspecified: Secondary | ICD-10-CM

## 2016-08-02 DIAGNOSIS — D591 Other autoimmune hemolytic anemias: Principal | ICD-10-CM

## 2016-08-02 DIAGNOSIS — E785 Hyperlipidemia, unspecified: Secondary | ICD-10-CM

## 2016-08-02 DIAGNOSIS — L930 Discoid lupus erythematosus: Secondary | ICD-10-CM

## 2016-08-02 DIAGNOSIS — I708 Atherosclerosis of other arteries: Secondary | ICD-10-CM

## 2016-08-02 LAB — CBC
HEMATOCRIT: 28.9 % — AB (ref 35.0–47.0)
HEMOGLOBIN: 10.2 g/dL — AB (ref 12.0–16.0)
MCH: 33.1 pg (ref 26.0–34.0)
MCHC: 35.3 g/dL (ref 32.0–36.0)
MCV: 93.8 fL (ref 80.0–100.0)
Platelets: 222 10*3/uL (ref 150–440)
RBC: 3.09 MIL/uL — ABNORMAL LOW (ref 3.80–5.20)
RDW: 17.9 % — AB (ref 11.5–14.5)
WBC: 8 10*3/uL (ref 3.6–11.0)

## 2016-08-02 LAB — COMPREHENSIVE METABOLIC PANEL
ALK PHOS: 60 U/L (ref 38–126)
ALT: 8 U/L — ABNORMAL LOW (ref 14–54)
ANION GAP: 7 (ref 5–15)
AST: 22 U/L (ref 15–41)
Albumin: 3.6 g/dL (ref 3.5–5.0)
BILIRUBIN TOTAL: 2 mg/dL — AB (ref 0.3–1.2)
BUN: 18 mg/dL (ref 6–20)
CALCIUM: 9 mg/dL (ref 8.9–10.3)
CO2: 25 mmol/L (ref 22–32)
Chloride: 107 mmol/L (ref 101–111)
Creatinine, Ser: 0.99 mg/dL (ref 0.44–1.00)
GFR calc Af Amer: >60 mL/min (ref >60)
Glucose, Bld: 177 mg/dL — ABNORMAL HIGH (ref 65–99)
POTASSIUM: 4.1 mmol/L (ref 3.5–5.1)
Sodium: 139 mmol/L (ref 135–145)
TOTAL PROTEIN: 7.1 g/dL (ref 6.5–8.1)

## 2016-08-02 MED ORDER — OMEPRAZOLE 20 MG PO CPDR: 20.0000 mg | DELAYED_RELEASE_CAPSULE | Freq: Every day | ORAL | 0 refills | Status: DC

## 2016-08-02 MED ORDER — PREDNISONE 5 MG PO TABS: 65.0000 mg | ORAL_TABLET | Freq: Every day | ORAL | Status: DC

## 2016-08-02 MED ORDER — PREDNISONE 5 MG PO TABS: 65.0000 mg | ORAL_TABLET | Freq: Every day | ORAL | 1 refills | Status: DC

## 2016-08-02 MED ORDER — FOLIC ACID 1 MG PO TABS: 1.0000 mg | ORAL_TABLET | Freq: Every day | ORAL | 0 refills | Status: DC

## 2016-08-02 NOTE — Progress Notes (Signed)
[facility] Hematology/Oncology Progress Note  Date of admission: 08/01/2016  Hospital day:  08/02/2016  Chief Complaint: Tina Page is a 60 y.o. 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  Subjective: Feels "ok".  Ready to go home.  Social History: The patient is accompanied by 2 daughters  today.  Allergies:  Allergies  Allergen Reactions  . Codeine Anaphylaxis    Scheduled Medications: . docusate sodium  100 mg Oral BID  . folic acid  1 mg Oral Daily  . lisinopril  5 mg Oral Daily  . pravastatin  40 mg Oral QPM  . predniSONE  65 mg Oral Q breakfast    Review of Systems: GENERAL: Fatigue. No fevers, sweats or weight loss. PERFORMANCE STATUS (ECOG): 1 HEENT: No visual changes, sore throat, mouth sores or tenderness. Lungs: No shortness of breath or cough. No hemoptysis. Cardiac: No chest pain, palpitations, orthopnea, or PND. GI: Appetite poor. No nausea, vomiting, diarrhea, constipation, melena or hematochezia. GU: No urgency, frequency, dysuria, or hematuria. Musculoskeletal: No back pain. No joint pain. No muscle tenderness. Extremities: No pain or swelling. Skin: Discoid lupus. Rash on chest, resolved. No rashes or skin changes. Neuro: No headache, weakness, balance or coordination issues. Endocrine: No diabetes, thyroid issues, hot flashes or night sweats. Psych: No mood changes, depression or anxiety. Pain: No focal pain. Review of systems: All other systems reviewed and found to be negative  Physical Exam: Blood pressure (!) 140/56, pulse 60, temperature 97.4 F (36.3 C), temperature source Oral, resp. rate 20, height _0  (1.6 m), weight 148 lb 7 oz (67.3 kg), SpO2 98 %.  GENERAL:Well developed, well nourished, woman sitting comfortably on the medical unit in no acute distress. MENTAL STATUS: Alert and oriented to person, place and time. HEAD:Long  gray hair. Normocephalic, atraumatic, face symmetric, no Cushingoid features. EYES:Glasses. Blue eyes. Pupils equal round and reactive to light and accomodation. No conjunctivitis or scleral icterus. HYI:FOYDXAJOIN clear without lesion. Tonguenormal. Mucous membranes moist. RESPIRATORY:Clear to auscultationwithout rales, wheezes or rhonchi. CARDIOVASCULAR:Regular rate andrhythmwithout murmur, rub or gallop. ABDOMEN:Soft, non-tender, with active bowel sounds, and no hepatosplenomegaly. No masses. SKIN: No rashes, ulcers or bruises. EXTREMITIES: No edema, no skin discoloration or tenderness. No palpable cords. LYMPHNODES: No palpable cervical, supraclavicular, axillary or inguinal adenopathy  NEUROLOGICAL: Unremarkable   Results for orders placed or performed during the hospital encounter of 08/01/16 (from the past 48 hour(s))  CBC with Differential     Status: Abnormal   Collection Time: 08/01/16  5:18 PM  Result Value Ref Range   WBC 6.9 3.6 - 11.0 K/uL   RBC 1.57 (L) 3.80 - 5.20 MIL/uL   Hemoglobin 6.1 (L) 12.0 - 16.0 g/dL   HCT 16.4 (L) 35.0 - 47.0 %   MCV 104.3 (H) 80.0 - 100.0 fL   MCH 38.6 (H) 26.0 - 34.0 pg   MCHC 37.0 (H) 32.0 - 36.0 g/dL   RDW 21.7 (H) 11.5 - 14.5 %   Platelets 218 150 - 440 K/uL   Neutrophils Relative % 53 %   Lymphocytes Relative 29 %   Monocytes Relative 7 %   Eosinophils Relative 4 %   Basophils Relative 1 %   Band Neutrophils 5 %   Metamyelocytes Relative 1 %   Myelocytes 0 %   Promyelocytes Absolute 0 %   Blasts 0 %   nRBC 2 (H) 0 /100 WBC   Other 0 %   Neutro Abs 4.0  1.4 - 6.5 K/uL   Lymphs Abs 2.0 1.0 - 3.6 K/uL   Monocytes Absolute 0.5 0.2 - 0.9 K/uL   Eosinophils Absolute 0.3 0 - 0.7 K/uL   Basophils Absolute 0.1 0 - 0.1 K/uL  Reticulocytes     Status: Abnormal   Collection Time: 08/01/16  5:18 PM  Result Value Ref Range   Retic Ct Pct 19.3 (H) 0.4 - 3.1 %   RBC. 1.57 (L) 3.80 - 5.20 MIL/uL   Retic Count, Manual  303.0 (H) 19.0 - 183.0 K/uL  Comprehensive metabolic panel     Status: Abnormal   Collection Time: 08/01/16  5:18 PM  Result Value Ref Range   Sodium 137 135 - 145 mmol/L   Potassium 3.7 3.5 - 5.1 mmol/L   Chloride 102 101 - 111 mmol/L   CO2 28 22 - 32 mmol/L   Glucose, Bld 95 65 - 99 mg/dL   BUN 20 6 - 20 mg/dL   Creatinine, Ser 1.07 (H) 0.44 - 1.00 mg/dL   Calcium 8.5 (L) 8.9 - 10.3 mg/dL   Total Protein 6.6 6.5 - 8.1 g/dL   Albumin 3.5 3.5 - 5.0 g/dL   AST 20 15 - 41 U/L   ALT 8 (L) 14 - 54 U/L   Alkaline Phosphatase 58 38 - 126 U/L   Total Bilirubin 2.5 (H) 0.3 - 1.2 mg/dL   GFR calc non Af Amer 56 (L) >60 mL/min   GFR calc Af Amer >60 >60 mL/min    Comment: (NOTE) The eGFR has been calculated using the CKD EPI equation. This calculation has not been validated in all clinical situations. eGFR's persistently <60 mL/min signify possible Chronic Kidney Disease.    Anion gap 7 5 - 15  Lactate dehydrogenase     Status: Abnormal   Collection Time: 08/01/16  5:18 PM  Result Value Ref Range   LDH 329 (H) 98 - 192 U/L  CBC     Status: Abnormal   Collection Time: 08/02/16  4:48 AM  Result Value Ref Range   WBC 8.0 3.6 - 11.0 K/uL   RBC 3.09 (L) 3.80 - 5.20 MIL/uL   Hemoglobin 10.2 (L) 12.0 - 16.0 g/dL    Comment: RESULT REPEATED AND VERIFIED   HCT 28.9 (L) 35.0 - 47.0 %   MCV 93.8 80.0 - 100.0 fL    Comment: RESULT REPEATED AND VERIFIED   MCH 33.1 26.0 - 34.0 pg   MCHC 35.3 32.0 - 36.0 g/dL   RDW 17.9 (H) 11.5 - 14.5 %   Platelets 222 150 - 440 K/uL  Comprehensive metabolic panel     Status: Abnormal   Collection Time: 08/02/16  4:48 AM  Result Value Ref Range   Sodium 139 135 - 145 mmol/L   Potassium 4.1 3.5 - 5.1 mmol/L   Chloride 107 101 - 111 mmol/L   CO2 25 22 - 32 mmol/L   Glucose, Bld 177 (H) 65 - 99 mg/dL   BUN 18 6 - 20 mg/dL   Creatinine, Ser 0.99 0.44 - 1.00 mg/dL   Calcium 9.0 8.9 - 10.3 mg/dL   Total Protein 7.1 6.5 - 8.1 g/dL   Albumin 3.6 3.5 - 5.0  g/dL   AST 22 15 - 41 U/L   ALT 8 (L) 14 - 54 U/L   Alkaline Phosphatase 60 38 - 126 U/L   Total Bilirubin 2.0 (H) 0.3 - 1.2 mg/dL   GFR calc non Af Amer >60 >60 mL/min  GFR calc Af Amer >60 >60 mL/min    Comment: (NOTE) The eGFR has been calculated using the CKD EPI equation. This calculation has not been validated in all clinical situations. eGFR's persistently <60 mL/min signify possible Chronic Kidney Disease.    Anion gap 7 5 - 15   No results found.  Assessment:  Tina Page is a 60 y.o. 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  She was diagnosed with a cold autoimmune hemolytic anemia on 07/07/2016.  She was admitted with symptomatic anemia for transfusion.  Additional testingincluded the following + studies: hepatitis C antibody, hepatitis B core antibody, CMV IgM, and EBV VCA (IgM and IgG). Hepatitis B by PCR was negative. Hepatitis C RNA was negative. Mycoplasma pneumonia IgM was negative. Reticulocyte count was 11.3% (high) indicating appropriate marrow response. C3 and C4 were normal. Cold agglutinin titer was negative. Negative studies included: ANA, hepatitis B surface antigen, SPEP, and free light chain ratio. 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 MMA 330 (normal) thus ruling outB12 deficiency.  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 There was no adenopathy. Spleen was normal.  She underwent PTCA and stent placementin right and left common iliac arteries and left external iliac artery on 07/10/2016. Colonoscopyon 03/19/2015 revealed one hyperplastic polyp. EGDon 07/08/2016 was normal. There was no evidence of bleeding.  She has received6 units of warmed PRBCsto date.Hematocrit has improved from 16.4 to  28.9 after transfusion of 2 units of PRBCs last night. She began steroids (1 mg/kg) on 08/01/2016.  Symptomatically, she denies any complaint.  Exam is unremarkable.  Plan: 1. Hematology:  Patient tolerated 2 units warmed RBCs yesterday.  She received solumedrol 1 mg/kg.  Hematocrit improved today.  Discuss plan to continue prednisone 65 mg a day as outpatient.  Discussed daily oral folic acid and J47 given high RBC turnover.  Discuss prophylactic PPI (omeprazole 20 mg a day) while on steroids.  Discuss plan for outpatient Rituxan with anti-viral coverage for hepatitis B prevention if hemolysis continues.  Preauthorization requested.  Continue Plavix s/p stent placement.  2.  Disposition: Discharge to outpatient department today with follow-up in clinic on 08/06/2016.   Lequita Asal, MD  08/02/2016, 11:38 AM

## 2016-08-02 NOTE — Consult Note (Signed)
[facility]  Date of admission:  08/01/2016  Inpatient day:  08/01/2016  Consulting physician: Dr. Nicholes Mango  Reason for Consultation:  Acute anemia.  Chief Complaint: Tina Page is a 60 y.o. 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  HPI:  The patient was admitted to [facility] from 07/07/2016 - 07/12/2016 with anemia and leg pain.  She was diagnosed with a cold autoimmune hemolytic anemia.  Hematocrit was 20.7 with a hemoglobin of 7.0  She received 1 unit of warmed PRBCs.  She underwent PTCA and stent placement in right and left common iliac arteries and left external iliac artery on 07/10/2016.  At discharge, hematocrit was 22.9 with a hemoglobin of 7.8.  The patient was readmitted to [facility] from 07/21/2016 - 07/23/2016.  She presented with symptomatic anemia.  Hemoglobin was 5.3.  Labs included an elevated bilirubin (2.9; indirect 2.5), low haptoglobin (< 10), and elevated LDH (390) c/w hemolysis.  Folate was 20.5 (normal).  Reticulocyte count was 22.1%.  PTT was 46 (24-36).  She was transfused with 3 units of warmed PRBCs.  CBC on discharge included a hematocrit of 28.4 and hemoglobin 10.  She was seen in the hematology clinic on 07/30/2016.  Hematocrit was 21.4 with a hemoglobin of 7.6.  Blood was requested from the blood bank.    Symptomatically, she notes fatigue on exertion.  She denies any chest pain.   Past Medical History:  Diagnosis Date  . Anemia   . COPD (chronic obstructive pulmonary disease) ([place])   . HLD (hyperlipidemia)   . Hypertension     Past Surgical History:  Procedure Laterality Date  . ESOPHAGOGASTRODUODENOSCOPY (EGD) WITH PROPOFOL N/A 07/08/2016   Procedure: ESOPHAGOGASTRODUODENOSCOPY (EGD) WITH PROPOFOL;  Surgeon: Manya Silvas, MD;  Location: [facility] ENDOSCOPY;  Service: Endoscopy;  Laterality: N/A;  . KNEE SURGERY    . PERIPHERAL VASCULAR CATHETERIZATION N/A 07/10/2016    Procedure: Lower Extremity Angiography;  Surgeon: Katha Cabal, MD;  Location: [place] CV LAB;  Service: Cardiovascular;  Laterality: N/A;  . PERIPHERAL VASCULAR CATHETERIZATION N/A 07/10/2016   Procedure: Abdominal Aortogram w/Lower Extremity;  Surgeon: Katha Cabal, MD;  Location: [place] CV LAB;  Service: Cardiovascular;  Laterality: N/A;  . PERIPHERAL VASCULAR CATHETERIZATION  07/10/2016   Procedure: Lower Extremity Intervention;  Surgeon: Katha Cabal, MD;  Location: [place] CV LAB;  Service: Cardiovascular;;    Family History  Problem Relation Age of Onset  . Diabetes Mother   . Hypertension Mother   . Diabetes Maternal Grandfather   . Hypertension Maternal Grandfather     Social History:  reports that she quit smoking about 3 weeks ago. Her smoking use included Cigarettes. She smoked 0.50 packs per day. She has never used smokeless tobacco. She reports that she does not drink alcohol or use drugs.  The patient works at [employer] She is exposed to cold temperatures. She accompanied by her daughters, [name] and Amy, today.  Allergies:  Allergies  Allergen Reactions  . Codeine Anaphylaxis    Medications Prior to Admission  Medication Sig Dispense Refill  . clopidogrel (PLAVIX) 75 MG tablet Take 1 tablet (75 mg total) by mouth daily. 30 tablet 5  . lisinopril (PRINIVIL,ZESTRIL) 5 MG tablet Take 5 mg by mouth daily.    . pravastatin (PRAVACHOL) 40 MG tablet Take 40 mg by mouth every evening.    . traMADol (ULTRAM) 50 MG tablet Take 1 tablet (50  mg total) by mouth every 6 (six) hours as needed for moderate pain or severe pain. 30 tablet 0  . albuterol (PROVENTIL HFA;VENTOLIN HFA) 108 (90 Base) MCG/ACT inhaler Inhale into the lungs.    . docusate sodium (COLACE) 100 MG capsule Take 1 capsule (100 mg total) by mouth 2 (two) times daily. (Patient not taking: Reported on 08/01/2016) 10 capsule 0  . potassium chloride SA (K-DUR,KLOR-CON)  20 MEQ tablet Take by mouth.      Review of Systems: GENERAL: Fatigue. No fevers, sweats or weight loss. PERFORMANCE STATUS (ECOG): 1 HEENT: No visual changes, sore throat, mouth sores or tenderness. Lungs: Shortness of breath on exertion.  No cough. No hemoptysis. Cardiac: No chest pain, palpitations, orthopnea, or PND. GI: Appetite poor.  No nausea, vomiting, diarrhea, constipation, melena or hematochezia. GU: No urgency, frequency, dysuria, or hematuria. Musculoskeletal: No back pain. No joint pain. No muscle tenderness. Extremities: Left foot numbness resolved after stent placement. No swelling. Skin: Discoid lupus. Rash on chest, fading.  No rashes or skin changes. Neuro: No headache, weakness, balance or coordination issues. Endocrine: No diabetes, thyroid issues, hot flashes or night sweats. Psych: No mood changes, depression or anxiety. Pain: No focal pain. Review of systems: All other systems reviewed and found to be negative  Physical Exam:  Blood pressure (!) 147/64, pulse 65, temperature 97.6 F (36.4 C), temperature source Oral, resp. rate 16, height _0  (1.6 m), weight 148 lb 7 oz (67.3 kg), SpO2 99 %.  GENERAL:Well developed, well nourished, pale woman sitting comfortably on the medical unit in no acute distress. MENTAL STATUS: Alert and oriented to person, place and time. HEAD:Long gray hair. Normocephalic, atraumatic, face symmetric, no Cushingoid features. EYES:Glasses.  Blue eyes.  Pupils equal round and reactive to light and accomodation. No conjunctivitis or scleral icterus. MIW:OEHOZYYQMG clear without lesion. Tonguenormal. Mucous membranes moist. RESPIRATORY:Clear to auscultationwithout rales, wheezes or rhonchi. CARDIOVASCULAR:Regular rate andrhythmwithout murmur, rub or gallop. ABDOMEN:Soft, non-tender, with active bowel sounds, and no hepatosplenomegaly. No masses. SKIN: Faint livedo reticularis across  chest. EXTREMITIES: No edema, no skin discoloration or tenderness. No palpable cords. LYMPHNODES: No palpable cervical, supraclavicular, axillary or inguinal adenopathy  NEUROLOGICAL: Unremarkable   Results for orders placed or performed during the hospital encounter of 08/01/16 (from the past 48 hour(s))  CBC with Differential     Status: Abnormal   Collection Time: 08/01/16  5:18 PM  Result Value Ref Range   WBC 6.9 3.6 - 11.0 K/uL   RBC 1.57 (L) 3.80 - 5.20 MIL/uL   Hemoglobin 6.1 (L) 12.0 - 16.0 g/dL   HCT 16.4 (L) 35.0 - 47.0 %   MCV 104.3 (H) 80.0 - 100.0 fL   MCH 38.6 (H) 26.0 - 34.0 pg   MCHC 37.0 (H) 32.0 - 36.0 g/dL   RDW 21.7 (H) 11.5 - 14.5 %   Platelets 218 150 - 440 K/uL   Neutrophils Relative % 53 %   Lymphocytes Relative 29 %   Monocytes Relative 7 %   Eosinophils Relative 4 %   Basophils Relative 1 %   Band Neutrophils 5 %   Metamyelocytes Relative 1 %   Myelocytes 0 %   Promyelocytes Absolute 0 %   Blasts 0 %   nRBC 2 (H) 0 /100 WBC   Other 0 %   Neutro Abs 4.0 1.4 - 6.5 K/uL   Lymphs Abs 2.0 1.0 - 3.6 K/uL   Monocytes Absolute 0.5 0.2 - 0.9 K/uL   Eosinophils Absolute 0.3  0 - 0.7 K/uL   Basophils Absolute 0.1 0 - 0.1 K/uL  Reticulocytes     Status: Abnormal   Collection Time: 08/01/16  5:18 PM  Result Value Ref Range   Retic Ct Pct 19.3 (H) 0.4 - 3.1 %   RBC. 1.57 (L) 3.80 - 5.20 MIL/uL   Retic Count, Manual 303.0 (H) 19.0 - 183.0 K/uL  Comprehensive metabolic panel     Status: Abnormal   Collection Time: 08/01/16  5:18 PM  Result Value Ref Range   Sodium 137 135 - 145 mmol/L   Potassium 3.7 3.5 - 5.1 mmol/L   Chloride 102 101 - 111 mmol/L   CO2 28 22 - 32 mmol/L   Glucose, Bld 95 65 - 99 mg/dL   BUN 20 6 - 20 mg/dL   Creatinine, Ser 1.07 (H) 0.44 - 1.00 mg/dL   Calcium 8.5 (L) 8.9 - 10.3 mg/dL   Total Protein 6.6 6.5 - 8.1 g/dL   Albumin 3.5 3.5 - 5.0 g/dL   AST 20 15 - 41 U/L   ALT 8 (L) 14 - 54 U/L   Alkaline Phosphatase 58 38 - 126 U/L    Total Bilirubin 2.5 (H) 0.3 - 1.2 mg/dL   GFR calc non Af Amer 56 (L) >60 mL/min   GFR calc Af Amer >60 >60 mL/min    Comment: (NOTE) The eGFR has been calculated using the CKD EPI equation. This calculation has not been validated in all clinical situations. eGFR's persistently <60 mL/min signify possible Chronic Kidney Disease.    Anion gap 7 5 - 15  Lactate dehydrogenase     Status: Abnormal   Collection Time: 08/01/16  5:18 PM  Result Value Ref Range   LDH 329 (H) 98 - 192 U/L   No results found.  Assessment:  The patient is a 60 y.o. woman discoid lupus and a history of hepatitis B.  She was diagnosed with a cold autoimmune hemolytic anemia on 07/07/2016.  She is admitted with symptomatic anemia for transfusion.  Additional testing included the following + studies:  hepatitis C antibody, hepatitis B core antibody, CMV IgM, and EBV VCA (IgM and IgG). Hepatitis B by PCR was negative.  Hepatitis C RNA was negative.  Mycoplasma pneumonia IgM was negative.  Reticulocyte count was 11.3% (high) indicating appropriate marrow response. C3 and C4 were normal. Cold agglutinin titer was negative.  Negative studies included:  ANA, hepatitis B surface antigen, SPEP, and free light chain ratio.  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  MMA 330 (normal) thus ruling out B12 deficiency.  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 There was no adenopathy. Spleen was normal.  She underwent PTCA and stent placement in right and left common iliac arteries and left external iliac artery on 07/10/2016.  Colonoscopy on 03/19/2015 revealed one hyperplastic polyp. EGD on 07/08/2016 was normal. There was no evidence of bleeding.  She has received 4 units of warmed PRBCs to date.   Hematocrit has dropped from 27.4 to 16.4 in the past 5 days.  Exam reveals livedo reticularis.  Plan:   1. Hematology:  Discuss with patient plan to transfuse 2 units of warmed PRBCs.  Discuss units are a best match.  There is a potential for blood incompatibility, given issue with her cold auto antibody, and risk for reaction/hemolysis.  Patient consented to transfusion.  Premedications Tylenol and Benadryl.  Baseline labs prior to transfusion:  CBC with diff, CMP, LDH, retic, MMA, cold agglutinin titer.  Supplement patient with daily folic acid and U72 given RBC turnover.  Discuss plan for steroids.  Although patient's with cold autoantibodies do not typically respond to steroids (< 15%), benefit may be as high as 30% in patients with an IgG component.  Discuss prednisone 1 mg/kg/day versus Decadron 40 mg pox 4 days.  Patient would like to try prednisone 1 mg/kg (65 mg a day).  Will start daily solumedrol IV while hospitalized.  Discuss prophylactic PPI (omeprazole 20 mg a day).  Discuss plan for outpatient Rituxan with anti-viral coverage for hepatitis B prevention.  Continue Plavix and baby aspirin s/p stent placement.  2.  Disposition:  Anticipate discharge to outpatient department on 08/02/2016.  Thank you for allowing me to participate in MORENIKE CUFF 's care.  I will follow her closely with you while hospitalized and after discharge in the outpatient department.   Lequita Asal, MD  08/01/2016

## 2016-08-02 NOTE — Discharge Summary (Signed)
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 Anemia   . COPD (chronic obstructive pulmonary disease) ([place])   . HLD (hyperlipidemia)   . Hypertension     HOSPITAL COURSE:   Tina Page  is a 60 y.o. female with a known history of autoimmune hemolytic anemia, COPD, hypertension and hyperlipidemia is sent over to the hospital as a direct admit for low hemoglobin. Patient denies any chest pain or shortness of breath while resting. Reporting exertional dyspnea. 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 Continue home medication lisinopril  #Hyperlipidemia continue statin  #DVT prophylaxis with SCDs  Overall stable D/c home CONSULTS OBTAINED:    DRUG ALLERGIES:   Allergies  Allergen Reactions  . Codeine Anaphylaxis    DISCHARGE MEDICATIONS:   Current Discharge Medication List    START taking these medications   Details  folic acid (FOLVITE) 1 MG tablet Take 1 tablet (1 mg total) by mouth daily. Qty: 30 tablet, Refills: 0   Associated Diagnoses: Other acquired hemolytic anemias (HCC)    omeprazole (PRILOSEC) 20 MG capsule Take 1 capsule (20 mg total) by mouth daily. Qty: 60 capsule, Refills: 0     predniSONE (DELTASONE) 5 MG tablet Take 13 tablets (65 mg total) by mouth daily with breakfast. Qty: 50 tablet, Refills: 1      CONTINUE these medications which have NOT CHANGED   Details  clopidogrel (PLAVIX) 75 MG tablet Take 1 tablet (75 mg total) by mouth daily. Qty: 30 tablet, Refills: 5    lisinopril (PRINIVIL,ZESTRIL) 5 MG tablet Take 5 mg by mouth daily.    pravastatin (PRAVACHOL) 40 MG tablet Take 40 mg by mouth every evening.    traMADol (ULTRAM) 50 MG tablet Take 1 tablet (50 mg total) by mouth every 6 (six) hours as needed for moderate pain or severe pain. Qty: 30 tablet, Refills: 0    albuterol (PROVENTIL HFA;VENTOLIN HFA) 108 (90 Base) MCG/ACT inhaler Inhale into the lungs.   Associated Diagnoses: Autoimmune hemolytic anemia, cold antibody type (HCC)    docusate sodium (COLACE) 100 MG capsule Take 1 capsule (100 mg total) by mouth 2 (two) times daily. Qty: 10 capsule, Refills: 0    potassium chloride SA (K-DUR,KLOR-CON) 20 MEQ tablet Take by mouth.   Associated Diagnoses: Autoimmune hemolytic anemia, cold antibody type (HCC)        If you experience worsening of your admission symptoms, develop shortness of breath, life threatening emergency, suicidal or homicidal thoughts you must seek medical attention immediately by calling 911 or calling your MD immediately  if symptoms less severe.  You Must read complete instructions/literature along with all the possible adverse reactions/side effects for all the Medicines you take and that have been prescribed to you. Take any new  Medicines after you have completely understood and accept all the possible adverse reactions/side effects.   Please note  You were cared for by a hospitalist during your hospital stay. If you have any questions about your discharge medications or the care you received while you were in the hospital after you are discharged, you can call the unit and asked to speak with the hospitalist on call if  the hospitalist that took care of you is not available. Once you are discharged, your primary care physician will handle any further medical issues. Please note that NO REFILLS for any discharge medications will be authorized once you are discharged, as it is imperative that you return to your primary care physician (or establish a relationship with a primary care physician if you do not have one) for your aftercare needs so that they can reassess your need for medications and monitor your lab values. Today   SUBJECTIVE   Feeling ok  VITAL SIGNS:  Blood pressure (!) 140/56, pulse 60, temperature 97.4 F (36.3 C), temperature source Oral, resp. rate 20, height 5\' 3"  (1.6 m), weight 67.3 kg (148 lb 7 oz), SpO2 98 %.  I/O:   Intake/Output Summary (Last 24 hours) at 08/02/16 1044 Last data filed at 08/02/16 0327  Gross per 24 hour  Intake              677 ml  Output              400 ml  Net              277 ml    PHYSICAL EXAMINATION:  GENERAL:  60 y.o.-year-old patient lying in the bed with no acute distress.  EYES: Pupils equal, round, reactive to light and accommodation. No scleral icterus. Extraocular muscles intact.  HEENT: Head atraumatic, normocephalic. Oropharynx and nasopharynx clear.  NECK:  Supple, no jugular venous distention. No thyroid enlargement, no tenderness.  LUNGS: Normal breath sounds bilaterally, no wheezing, rales,rhonchi or crepitation. No use of accessory muscles of respiration.  CARDIOVASCULAR: S1, S2 normal. No murmurs, rubs, or gallops.  ABDOMEN: Soft, non-tender, non-distended. Bowel sounds present. No organomegaly or mass.  EXTREMITIES: No pedal edema, cyanosis, or clubbing.  NEUROLOGIC: Cranial nerves II through XII are intact. Muscle strength 5/5 in all extremities. Sensation intact. Gait not checked.  PSYCHIATRIC: The patient is alert and oriented x 3.  SKIN: No obvious rash, lesion, or ulcer.   DATA REVIEW:   CBC   Recent Labs Lab 08/02/16 0448   WBC 8.0  HGB 10.2*  HCT 28.9*  PLT 222    Chemistries   Recent Labs Lab 08/02/16 0448  NA 139  K 4.1  CL 107  CO2 25  GLUCOSE 177*  BUN 18  CREATININE 0.99  CALCIUM 9.0  AST 22  ALT 8*  ALKPHOS 60  BILITOT 2.0*    Microbiology Results   No results found for this or any previous visit (from the past 240 hour(s)).  RADIOLOGY:  No results found.   Management plans discussed with the patient, family and they are in agreement.  CODE STATUS:     Code Status Orders        Start     Ordered   08/01/16 1718  Full code  Continuous     08/01/16 1718    Code Status History    Date Active Date Inactive Code Status Order ID Comments User Context   07/21/2016  5:22 PM 07/23/2016  1:55 PM Full  Code [id number]  Lytle Butte, MD ED   07/07/2016 10:29 PM 07/12/2016  2:51 PM Full Code [id number]  Lance Coon, MD ED      TOTAL TIME TAKING CARE OF THIS PATIENT: 40 minutes.    Tina Page M.D on 08/02/2016 at 10:44 AM  Between 7am to 6pm - Pager - [phone] After 6pm go to [email or web address] - password [email or web address] [facility]  Office  [phone]  CC: Primary care physician; Frazier Richards, MD

## 2016-08-03 ENCOUNTER — Telehealth: Payer: Self-pay | Admitting: *Deleted

## 2016-08-03 NOTE — Telephone Encounter (Signed)
Verified with patient that she was taking the prednisone 65 mg a day, folic acid, 123456, and omeprazole as ordered and was aware of possible treatment and appt for Thursday.

## 2016-08-04 ENCOUNTER — Telehealth: Payer: Self-pay | Admitting: *Deleted

## 2016-08-04 NOTE — Telephone Encounter (Signed)
I called and spoke to family member and pt not at home now but she does know she is on all the meds but the omeprazole she was not sure but will have pt call when she gets home and I gave her my telephone number

## 2016-08-04 NOTE — Telephone Encounter (Signed)
-----   Message from Lequita Asal, MD sent at 08/02/2016 11:49 AM EST ----- Regarding: Patient discharged home on 12/03  Please call patient to confirm she is taking prednisone 65 mg a day, folic acid, 123456, and omeprazole.  Preauth for Rituxan (orders in).  Appt on Thursday.  M

## 2016-08-05 LAB — METHYLMALONIC ACID, SERUM: Methylmalonic Acid, Quantitative: 728 nmol/L — ABNORMAL HIGH (ref 0–378)

## 2016-08-05 LAB — COLD AGGLUTININ TITER: COLD AGGLUTININ TITER: NEGATIVE (ref <1:32)

## 2016-08-06 ENCOUNTER — Inpatient Hospital Stay: Payer: BLUE CROSS/BLUE SHIELD

## 2016-08-06 ENCOUNTER — Inpatient Hospital Stay (HOSPITAL_BASED_OUTPATIENT_CLINIC_OR_DEPARTMENT_OTHER): Payer: BLUE CROSS/BLUE SHIELD | Admitting: Hematology and Oncology

## 2016-08-06 ENCOUNTER — Other Ambulatory Visit: Payer: Self-pay

## 2016-08-06 ENCOUNTER — Ambulatory Visit: Payer: BLUE CROSS/BLUE SHIELD | Admitting: Hematology and Oncology

## 2016-08-06 ENCOUNTER — Other Ambulatory Visit: Payer: Self-pay | Admitting: Hematology and Oncology

## 2016-08-06 ENCOUNTER — Other Ambulatory Visit: Payer: BLUE CROSS/BLUE SHIELD

## 2016-08-06 VITALS — BP 170/75 | HR 55 | Temp 94.5°F | Wt 144.8 lb

## 2016-08-06 DIAGNOSIS — Z79899 Other long term (current) drug therapy: Secondary | ICD-10-CM

## 2016-08-06 DIAGNOSIS — D5912 Cold autoimmune hemolytic anemia: Secondary | ICD-10-CM

## 2016-08-06 DIAGNOSIS — B1920 Unspecified viral hepatitis C without hepatic coma: Secondary | ICD-10-CM

## 2016-08-06 DIAGNOSIS — J449 Chronic obstructive pulmonary disease, unspecified: Secondary | ICD-10-CM | POA: Diagnosis not present

## 2016-08-06 DIAGNOSIS — I10 Essential (primary) hypertension: Secondary | ICD-10-CM | POA: Diagnosis not present

## 2016-08-06 DIAGNOSIS — D890 Polyclonal hypergammaglobulinemia: Secondary | ICD-10-CM

## 2016-08-06 DIAGNOSIS — E538 Deficiency of other specified B group vitamins: Secondary | ICD-10-CM | POA: Diagnosis not present

## 2016-08-06 DIAGNOSIS — E785 Hyperlipidemia, unspecified: Secondary | ICD-10-CM | POA: Diagnosis not present

## 2016-08-06 DIAGNOSIS — Z955 Presence of coronary angioplasty implant and graft: Secondary | ICD-10-CM | POA: Diagnosis not present

## 2016-08-06 DIAGNOSIS — B1910 Unspecified viral hepatitis B without hepatic coma: Secondary | ICD-10-CM

## 2016-08-06 DIAGNOSIS — D591 Other autoimmune hemolytic anemias: Principal | ICD-10-CM

## 2016-08-06 DIAGNOSIS — L930 Discoid lupus erythematosus: Secondary | ICD-10-CM | POA: Diagnosis not present

## 2016-08-06 DIAGNOSIS — Z87891 Personal history of nicotine dependence: Secondary | ICD-10-CM | POA: Diagnosis not present

## 2016-08-06 LAB — SAMPLE TO BLOOD BANK

## 2016-08-06 LAB — COMPREHENSIVE METABOLIC PANEL
ALT: 10 U/L — ABNORMAL LOW (ref 14–54)
AST: 16 U/L (ref 15–41)
Albumin: 3.8 g/dL (ref 3.5–5.0)
Alkaline Phosphatase: 46 U/L (ref 38–126)
Anion gap: 5 (ref 5–15)
BUN: 29 mg/dL — ABNORMAL HIGH (ref 6–20)
CO2: 28 mmol/L (ref 22–32)
Calcium: 9.1 mg/dL (ref 8.9–10.3)
Chloride: 105 mmol/L (ref 101–111)
Creatinine, Ser: 1.05 mg/dL — ABNORMAL HIGH (ref 0.44–1.00)
GFR calc Af Amer: >60 mL/min (ref >60)
GFR calc non Af Amer: 57 mL/min — ABNORMAL LOW (ref >60)
Glucose, Bld: 108 mg/dL — ABNORMAL HIGH (ref 65–99)
Potassium: 3.4 mmol/L — ABNORMAL LOW (ref 3.5–5.1)
Sodium: 138 mmol/L (ref 135–145)
Total Bilirubin: 1.6 mg/dL — ABNORMAL HIGH (ref 0.3–1.2)
Total Protein: 7.1 g/dL (ref 6.5–8.1)

## 2016-08-06 LAB — CBC WITH DIFFERENTIAL/PLATELET
Basophils Absolute: 0.1 10*3/uL (ref 0–0.1)
Basophils Relative: 1 %
Eosinophils Absolute: 0.1 10*3/uL (ref 0–0.7)
Eosinophils Relative: 1 %
HCT: 31.2 % — ABNORMAL LOW (ref 35.0–47.0)
Hemoglobin: 10.9 g/dL — ABNORMAL LOW (ref 12.0–16.0)
Lymphocytes Relative: 19 %
Lymphs Abs: 1.7 10*3/uL (ref 1.0–3.6)
MCH: 32.3 pg (ref 26.0–34.0)
MCHC: 34.8 g/dL (ref 32.0–36.0)
MCV: 93 fL (ref 80.0–100.0)
Monocytes Absolute: 0.5 10*3/uL (ref 0.2–0.9)
Monocytes Relative: 6 %
Neutro Abs: 6.5 10*3/uL (ref 1.4–6.5)
Neutrophils Relative %: 73 %
Platelets: 195 10*3/uL (ref 150–440)
RBC: 3.36 MIL/uL — ABNORMAL LOW (ref 3.80–5.20)
RDW: 24 % — ABNORMAL HIGH (ref 11.5–14.5)
WBC: 8.9 10*3/uL (ref 3.6–11.0)

## 2016-08-06 LAB — CRYOGLOBULIN

## 2016-08-06 LAB — FOLATE: Folate: 78.5 ng/mL (ref >5.9)

## 2016-08-06 MED ORDER — PREDNISONE 5 MG PO TABS: ORAL_TABLET | ORAL | 0 refills | Status: DC

## 2016-08-06 MED ORDER — CYANOCOBALAMIN 1000 MCG/ML IJ SOLN
1000.0000 ug | Freq: Once | INTRAMUSCULAR | Status: AC
  Administered 2016-08-06: 1000 ug via INTRAMUSCULAR
  Filled 2016-08-06: qty 1

## 2016-08-06 MED ORDER — PREDNISONE 20 MG PO TABS: ORAL_TABLET | ORAL | 0 refills | Status: DC

## 2016-08-06 NOTE — Progress Notes (Signed)
[facility] day:  08/06/2016  Chief Complaint: Tina Page is a 60 y.o. female with cold hemolytic anemia who is seen for reassessment following interval hospitalization.  HPI:  The patient was last seen in the hematology clinic on 07/30/2016.  At that time, hematocrit had dropped from 27.4 to 21.4 in 3 days.  Exam revealed livedo reticularis. Follow-up counts on 08/01/2016 revealed a hematocrit of 16.4 and hemoglobin 6.1.    She was admitted to [facility] from 08/01/2016 - 08/02/2016.  She received 2 units of warmed PRBCs.  She began steroids.  She received 1 mg/kg Solumederol on 08/01/2016.  Discharge hematocrit was 28.9 with a hemoglobin of 10.2. Retic was 19.3%.  Bilirubin was 2.5.  LDH was 329.   MMA was 728 indicative of B12 deficiency.  She was discharged on 65 mg per day of prednisone, folic acid, and oral J88.  Additional labs included a negative cold agglutinin titer.  Cryoglobulins were negative.  Symptomatically, her energy is better. Sleep is poor.   Past Medical History:  Diagnosis Date  . Anemia   . COPD (chronic obstructive pulmonary disease) ([place])   . HLD (hyperlipidemia)   . Hypertension     Past Surgical History:  Procedure Laterality Date  . ESOPHAGOGASTRODUODENOSCOPY (EGD) WITH PROPOFOL N/A 07/08/2016   Procedure: ESOPHAGOGASTRODUODENOSCOPY (EGD) WITH PROPOFOL;  Surgeon: Manya Silvas, MD;  Location: [facility] ENDOSCOPY;  Service: Endoscopy;  Laterality: N/A;  . KNEE SURGERY    . PERIPHERAL VASCULAR CATHETERIZATION N/A 07/10/2016   Procedure: Lower Extremity Angiography;  Surgeon: Katha Cabal, MD;  Location: [place] CV LAB;  Service: Cardiovascular;  Laterality: N/A;  . PERIPHERAL VASCULAR CATHETERIZATION N/A 07/10/2016   Procedure: Abdominal Aortogram w/Lower Extremity;  Surgeon: Katha Cabal, MD;  Location: [place] [facility];  Service: Cardiovascular;  Laterality: N/A;  . PERIPHERAL VASCULAR  CATHETERIZATION  07/10/2016   Procedure: Lower Extremity Intervention;  Surgeon: Katha Cabal, MD;  Location: [place] [facility];  Service: Cardiovascular;;    Family History  Problem Relation Age of Onset  . Diabetes Mother   . Hypertension Mother   . Diabetes Maternal Grandfather   . Hypertension Maternal Grandfather     Social History:  reports that she quit smoking about 3 weeks ago. Her smoking use included Cigarettes. She smoked 0.50 packs per day. She has never used smokeless tobacco. She reports that she does not drink alcohol or use drugs.  The patient works at [employer]  She is exposed to cold temperatures.  She has a daughter named, Tina Page and a daughter named Tina Page.  She is accompanied by her daughter today.  Allergies:  Allergies  Allergen Reactions  . Codeine Anaphylaxis    Current Medications: Current Outpatient Prescriptions  Medication Sig Dispense Refill  . albuterol (PROVENTIL HFA;VENTOLIN HFA) 108 (90 Base) MCG/ACT inhaler Inhale into the lungs.    . clopidogrel (PLAVIX) 75 MG tablet Take 1 tablet (75 mg total) by mouth daily. 30 tablet 5  . docusate sodium (COLACE) 100 MG capsule Take 1 capsule (100 mg total) by mouth 2 (two) times daily. 10 capsule 0  . folic acid (FOLVITE) 1 MG tablet Take 1 tablet (1 mg total) by mouth daily. 30 tablet 0  . lisinopril (PRINIVIL,ZESTRIL) 5 MG tablet Take 5 mg by mouth daily.    [name] Kitchen omeprazole (PRILOSEC) 20 MG capsule Take 1 capsule (20 mg total) by mouth daily. 60 capsule 0  . pravastatin (  PRAVACHOL) 40 MG tablet Take 40 mg by mouth every evening.    . predniSONE (DELTASONE) 5 MG tablet Take 13 tablets (65 mg total) by mouth daily with breakfast. 50 tablet 1  . traMADol (ULTRAM) 50 MG tablet Take 1 tablet (50 mg total) by mouth every 6 (six) hours as needed for moderate pain or severe pain. 30 tablet 0  . potassium chloride SA (K-DUR,KLOR-CON) 20 MEQ tablet Take by mouth.     No current  facility-administered medications for this visit.     Review of Systems:  GENERAL: Energy level improved. No fevers, sweats or weight loss. PERFORMANCE STATUS (ECOG): 1 HEENT: No visual changes, sore throat, mouth sores or tenderness. Lungs: No shortness of breath or cough. No hemoptysis. Cardiac: No chest pain, palpitations, orthopnea, or PND. GI: Appetite fair.  No nausea, vomiting, diarrhea, constipation, melena or hematochezia. GU: No urgency, frequency, dysuria, or hematuria. Musculoskeletal: No back pain. No joint pain. No muscle tenderness. Extremities: Left foot numbness resolved after stent placement. No swelling. Skin: Discoid lupus. Rash on chest, resolved.  No rashes or skin changes. Neuro: No headache, weakness, balance or coordination issues. Endocrine: No diabetes, thyroid issues, hot flashes or night sweats. Psych: No mood changes, depression or anxiety. Pain: No focal pain. Review of systems: All other systems reviewed and found to be negative.  Physical Exam: Blood pressure (!) 147/65, pulse (!) 57, temperature (!) 94.5 F (34.7 C), temperature source Tympanic, weight 144 lb 13.5 oz (65.7 kg). GENERAL:Well developed, well nourished, woman sitting comfortably in the exam room in no acute distress. MENTAL STATUS: Alert and oriented to person, place and time. HEAD:Long gray hair. Normocephalic, atraumatic, face symmetric, no Cushingoid features. EYES:Glasses.  Blue eyes.  Pupils equal round and reactive to light and accomodation. No conjunctivitis or scleral icterus. YDX:AJOINOMVEH clear without lesion. Tonguenormal. Mucous membranes moist. RESPIRATORY:Clear to auscultationwithout rales, wheezes or rhonchi. CARDIOVASCULAR:Regular rate andrhythmwithout murmur, rub or gallop. ABDOMEN:Soft, non-tender, with active bowel sounds, and no hepatosplenomegaly. No masses. SKIN: Livedo reticularis across chest, resolved. EXTREMITIES: No  edema, no skin discoloration or tenderness. No palpable cords. LYMPHNODES: No palpable cervical, supraclavicular, axillary or inguinal adenopathy  NEUROLOGICAL: Unremarkable   Orders Only on 08/06/2016  Component Date Value Ref Range Status  . WBC 08/06/2016 8.9  3.6 - 11.0 K/uL Final  . RBC 08/06/2016 3.36* 3.80 - 5.20 MIL/uL Final  . Hemoglobin 08/06/2016 10.9* 12.0 - 16.0 g/dL Final  . HCT 08/06/2016 31.2* 35.0 - 47.0 % Final  . MCV 08/06/2016 93.0  80.0 - 100.0 fL Final  . MCH 08/06/2016 32.3  26.0 - 34.0 pg Final  . MCHC 08/06/2016 34.8  32.0 - 36.0 g/dL Final  . RDW 08/06/2016 24.0* 11.5 - 14.5 % Final  . Platelets 08/06/2016 195  150 - 440 K/uL Final  . Neutrophils Relative % 08/06/2016 73  % Final  . Neutro Abs 08/06/2016 6.5  1.4 - 6.5 K/uL Final  . Lymphocytes Relative 08/06/2016 19  % Final  . Lymphs Abs 08/06/2016 1.7  1.0 - 3.6 K/uL Final  . Monocytes Relative 08/06/2016 6  % Final  . Monocytes Absolute 08/06/2016 0.5  0.2 - 0.9 K/uL Final  . Eosinophils Relative 08/06/2016 1  % Final  . Eosinophils Absolute 08/06/2016 0.1  0 - 0.7 K/uL Final  . Basophils Relative 08/06/2016 1  % Final  . Basophils Absolute 08/06/2016 0.1  0 - 0.1 K/uL Final  . Sodium 08/06/2016 138  135 - 145 mmol/L Final  .  Potassium 08/06/2016 3.4* 3.5 - 5.1 mmol/L Final  . Chloride 08/06/2016 105  101 - 111 mmol/L Final  . CO2 08/06/2016 28  22 - 32 mmol/L Final  . Glucose, Bld 08/06/2016 108* 65 - 99 mg/dL Final  . BUN 08/06/2016 29* 6 - 20 mg/dL Final  . Creatinine, Ser 08/06/2016 1.05* 0.44 - 1.00 mg/dL Final  . Calcium 08/06/2016 9.1  8.9 - 10.3 mg/dL Final  . Total Protein 08/06/2016 7.1  6.5 - 8.1 g/dL Final  . Albumin 08/06/2016 3.8  3.5 - 5.0 g/dL Final  . AST 08/06/2016 16  15 - 41 U/L Final  . ALT 08/06/2016 10* 14 - 54 U/L Final  . Alkaline Phosphatase 08/06/2016 46  38 - 126 U/L Final  . Total Bilirubin 08/06/2016 1.6* 0.3 - 1.2 mg/dL Final  . GFR calc non Af Amer 08/06/2016 57*  >60 mL/min Final  . GFR calc Af Amer 08/06/2016 >60  >60 mL/min Final   Comment: (NOTE) The eGFR has been calculated using the CKD EPI equation. This calculation has not been validated in all clinical situations. eGFR's persistently <60 mL/min signify possible Chronic Kidney Disease.   . 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 One week prior to presentation, she felt like she was coming down with something. She denied any fever, runny nose, sore throat or cough. She had some diarrhea. She denied any new medications or herbal products.  Anemia workup revealed revealed a cold autoantibody (IgG and complement).  Reticulocyte count was 11.3%.  Ferritin was 562.  Iron studies revealed a saturation of 20% and a TIBC of 214 (low).  B12 was 231 (low normal).  Folate was 42.   Peripheral smear revealed rouleaux formation.  Additional testing included the following + studies:  hepatitis C antibody, hepatitis B core antibody, CMV IgM, and EBV VCA (IgM and IgG).  Hepatitis B by PCR was negative.  Hepatitis C RNA was negative.  Mycoplasma pneumonia IgM was negative.  Reticulocyte count was 11.3% (high) indicating appropriate marrow response.  C3 and C4 were normal.  Cold agglutinin titer was negative.  Negative studies included:  ANA, hepatitis B surface antigen, SPEP, and free light chain ratio.   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  MMA 330 (normal) thus ruling out B12 deficiency.  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 There was no adenopathy. Spleen was normal.  She underwent PTCA and stent placement in right and  left common iliac arteries and left external iliac artery on 07/10/2016.  EGD on 03/19/2015 revealed gastritis in the body and antrum. Colonoscopy on 03/19/2015 revealed one hyperplastic polyp. EGD on 07/08/2016 was normal.  No evidence of bleeding.  She has received 6 units of warmed PRBCs to date (last on 08/01/2016).   She began steroids (1 mg/kg) on 08/01/2016.  She is currently on prednisone 65 mg a day.  She is on folic acid 1 mg a day.  Symptomatically, her energy level has improved.  Hematocrit has stabilized.  Livedo reticularis skin changes have resolved.  Plan: 1.  Labs today:  CBC with diff, CMP, folate, hepatitis B surface antibody, hepatitis B  surface antigen, hold tube. 2.  Discuss improvement in hematocrit on steroids.  Discuss continuation of steroids and postponement of initiation of Rituxan.  Discuss issues with hepatitis B.  Discuss plan to consult Dr. Allen Norris regarding prophylaxis for hepatitis B if unable to taper steroids successfully.  Patient in agreement. 3.  Continue prednisone 65 mg a day.  Begin taper next week if counts stable and hemolysis improved. 4.  Rx:  prednisone (20 mg tablets and 5 mg tablets). 40.  Consult Dr. Allen Norris: hepatitis B core antibody + and treatment with Rituxan. 6.  B12 today 7.  RTC on 08/14/2016 for MD assessment, labs (CBC with diff, LDH, hold tube, retic), and +/- Rituxan.   Lequita Asal, MD  08/06/2016, 9:37 AM

## 2016-08-06 NOTE — Progress Notes (Signed)
Patient states she took her last prednisone this morning.  If she is to continue taking she will need refill.  No new diagnosis since last visit.

## 2016-08-07 ENCOUNTER — Telehealth: Payer: Self-pay | Admitting: *Deleted

## 2016-08-07 LAB — HEPATITIS B SURFACE ANTIGEN: Hepatitis B Surface Ag: NEGATIVE

## 2016-08-07 LAB — TYPE AND SCREEN
Blood Product Expiration Date: 201712222359
Blood Product Expiration Date: 201801032359
ISSUE DATE / TIME: 201712022019
ISSUE DATE / TIME: 201712030028
Unit Type and Rh: 600
Unit Type and Rh: 9500

## 2016-08-07 LAB — HEPATITIS B SURFACE ANTIBODY, QUANTITATIVE: Hepatitis B-Post: 31.5 m[IU]/mL (ref >9.9)

## 2016-08-07 NOTE — Telephone Encounter (Signed)
Called pt to let her know that Dr. Mike Gip spoke to Dr. Allen Norris who is GI MD in regards to the positive Hep B and Hep C. He would like to see her to talk about medication with Hep. And how to move along with Rituxan and how one would affect the other and how both md work together to treat her.  They gave me an appt for 12/11 at 2:15. Pt is agreeable and I gave her directions and how to get there. She has been to [employer] outlets and It is on the same street and I told her the [employer] and that she will go in and take elevator to second and floor and the office name is [facility]. And gave her phone # in case she needs it.

## 2016-08-10 ENCOUNTER — Ambulatory Visit (INDEPENDENT_AMBULATORY_CARE_PROVIDER_SITE_OTHER): Payer: BLUE CROSS/BLUE SHIELD | Admitting: Gastroenterology

## 2016-08-10 ENCOUNTER — Encounter: Payer: Self-pay | Admitting: Gastroenterology

## 2016-08-10 VITALS — BP 140/62 | HR 70 | Temp 98.3°F | Ht 63.0 in | Wt 147.5 lb

## 2016-08-10 DIAGNOSIS — R945 Abnormal results of liver function studies: Secondary | ICD-10-CM

## 2016-08-10 DIAGNOSIS — R7989 Other specified abnormal findings of blood chemistry: Secondary | ICD-10-CM | POA: Diagnosis not present

## 2016-08-13 ENCOUNTER — Encounter: Payer: Self-pay | Admitting: Hematology and Oncology

## 2016-08-13 NOTE — Progress Notes (Signed)
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  HPI:  The patient was last seen in the hematology clinic on 08/06/2016.  At that time, her hematocrit had stabilized at 31.2 with a hemoglobin of 10.9.  Bilirubin was 1.6 (improved). Hepatitis B surface antibody was positive and hepatitis B surface antigen was negative.  She received B12 1000 mcg IM.  At last visit, decision was made to continue prednisone 1 mg/kg (65 mg/day) and postpone Rituxan.  She was to have followed up with Dr. Allen Norris regarding her hepatitis serologies and prophylactic antiviral medications.  She saw Dr. Allen Norris on 08/10/2016.  She has had a sinus infection and is on Augmentin.  Energy level comes and goes.  She is taking oral B12.   Past Medical History:  Diagnosis Date  . Anemia   . COPD (chronic obstructive pulmonary disease) ([place])   . HLD (hyperlipidemia)   . Hypertension     Past Surgical History:  Procedure Laterality Date  . ESOPHAGOGASTRODUODENOSCOPY (EGD) WITH PROPOFOL N/A 07/08/2016   Procedure: ESOPHAGOGASTRODUODENOSCOPY (EGD) WITH PROPOFOL;  Surgeon: Manya Silvas, MD;  Location: [facility]COPY;  Service: Endoscopy;  Laterality: N/A;  . KNEE SURGERY    . PERIPHERAL VASCULAR CATHETERIZATION N/A 07/10/2016   Procedure: Lower Extremity Angiography;  Surgeon: Katha Cabal, MD;  Location: [place] [facility];  Service: Cardiovascular;  Laterality: N/A;  . PERIPHERAL VASCULAR CATHETERIZATION N/A 07/10/2016   Procedure: Abdominal Aortogram w/Lower Extremity;  Surgeon: Katha Cabal, MD;  Location: [place] [facility];  Service: Cardiovascular;  Laterality: N/A;  . PERIPHERAL VASCULAR CATHETERIZATION  07/10/2016   Procedure: Lower Extremity Intervention;  Surgeon: Katha Cabal, MD;  Location: [place] [facility];  Service: Cardiovascular;;     Family History  Problem Relation Age of Onset  . Diabetes Mother   . Hypertension Mother   . Diabetes Maternal Grandfather   . Hypertension Maternal Grandfather     Social History:  reports that she quit smoking about 4 weeks ago. Her smoking use included Cigarettes. She smoked 0.50 packs per day. She has never used smokeless tobacco. She reports that she does not drink alcohol or use drugs.  The patient works at [employer]  She is exposed to cold temperatures.  She has a daughter named, [name] and a daughter named [name].  She is accompanied by her daughter today.  Allergies:  Allergies  Allergen Reactions  . Codeine Anaphylaxis    Current Medications: Current Outpatient Prescriptions  Medication Sig Dispense Refill  . albuterol (PROVENTIL HFA;VENTOLIN HFA) 108 (90 Base) MCG/ACT inhaler Inhale into the lungs.    [name] Kitchen amoxicillin-clavulanate (AUGMENTIN) 875-125 MG tablet Take 1 tablet by mouth 2 (two) times daily.    . clopidogrel (PLAVIX) 75 MG tablet Take 1 tablet (75 mg total) by mouth daily. 30 tablet 5  . docusate sodium (COLACE) 100 MG capsule Take 1 capsule (100 mg total) by mouth 2 (two) times daily. 10 capsule 0  . folic acid (FOLVITE) 1 MG tablet Take 1 tablet (1 mg total) by mouth daily. 30 tablet 0  . lisinopril (PRINIVIL,ZESTRIL) 5 MG tablet Take 5 mg by mouth daily.    [name] Kitchen omeprazole (PRILOSEC) 20 MG capsule Take 1 capsule (20 mg total) by mouth daily. 60 capsule 0  . potassium chloride SA (K-DUR,KLOR-CON) 20 MEQ tablet  Take by mouth.    . pravastatin (PRAVACHOL) 40 MG tablet Take 40 mg by mouth every evening.    . predniSONE (DELTASONE) 20 MG tablet 3 tablets a day, patient also taking 5 mg separate tablet for a total of 65 mg 90 tablet 0  . predniSONE (DELTASONE) 5 MG tablet Take 13 tablets (65 mg total) by mouth daily with breakfast. 50 tablet 1  . predniSONE (DELTASONE) 5 MG tablet Take 1 tablet daily, patient also taking a dose of 60 mg for a total of  65 mg daily 30 tablet 0  . traMADol (ULTRAM) 50 MG tablet Take 1 tablet (50 mg total) by mouth every 6 (six) hours as needed for moderate pain or severe pain. 30 tablet 0   No current facility-administered medications for this visit.     Review of Systems:  GENERAL: Energy level comes and goes. No fevers or sweats.  Weight down 1 pound. PERFORMANCE STATUS (ECOG): 1 HEENT: Sinus infection.  No visual changes, sore throat, mouth sores or tenderness. Lungs: No shortness of breath or cough. No hemoptysis. Cardiac: No chest pain, palpitations, orthopnea, or PND. GI: Appetite fair.  No nausea, vomiting, diarrhea, constipation, melena or hematochezia. GU: No urgency, frequency, dysuria, or hematuria. Musculoskeletal: No back pain. No joint pain. No muscle tenderness. Extremities: Left foot numbness resolved after stent placement. No pain or swelling. Skin: Discoid lupus. No rashes or skin changes. Neuro: No headache, weakness, balance or coordination issues. Endocrine: No diabetes, thyroid issues, hot flashes or night sweats. Psych: No mood changes, depression or anxiety. Pain: No focal pain. Review of systems: All other systems reviewed and found to be negative.  Physical Exam: Blood pressure (!) 156/92, pulse 68, temperature 97.4 F (36.3 C), temperature source Tympanic, resp. rate 18, height 5\' 3"  (1.6 m), weight 143 lb 15.4 oz (65.3 kg). GENERAL:Well developed, well nourished, woman sitting comfortably in the exam room in no acute distress. MENTAL STATUS: Alert and oriented to person, place and time. HEAD:Long gray hair. Slightly full face.  Normocephalic, atraumatic, face symmetric, no Cushingoid features. EYES:Glasses.  Blue eyes.  Pupils equal round and reactive to light and accomodation. No conjunctivitis or scleral icterus. ENT:No thrush.  Oropharynx clear without lesion. Tonguenormal. Mucous membranes moist. RESPIRATORY:Clear to  auscultationwithout rales, wheezes or rhonchi. CARDIOVASCULAR:Regular rate andrhythmwithout murmur, rub or gallop. ABDOMEN:Soft, non-tender, with active bowel sounds, and no hepatosplenomegaly. No masses. SKIN: No rashes or ulcers. EXTREMITIES: No edema, no skin discoloration or tenderness. No palpable cords. LYMPHNODES: No palpable cervical, supraclavicular, axillary or inguinal adenopathy  NEUROLOGICAL: Unremarkable   Appointment on 08/14/2016  Component Date Value Ref Range Status  . WBC 08/14/2016 8.1  3.6 - 11.0 K/uL Final  . RBC 08/14/2016 3.75* 3.80 - 5.20 MIL/uL Final  . Hemoglobin 08/14/2016 12.5  12.0 - 16.0 g/dL Final  . HCT 08/14/2016 36.1  35.0 - 47.0 % Final  . MCV 08/14/2016 96.4  80.0 - 100.0 fL Final  . MCH 08/14/2016 33.3  26.0 - 34.0 pg Final  . MCHC 08/14/2016 34.6  32.0 - 36.0 g/dL Final  . RDW 08/14/2016 22.8* 11.5 - 14.5 % Final  . Platelets 08/14/2016 145* 150 - 440 K/uL Final  . Neutrophils Relative % 08/14/2016 79  % Final  . Neutro Abs 08/14/2016 6.3  1.4 - 6.5 K/uL Final  . Lymphocytes Relative 08/14/2016 12  % Final  . Lymphs Abs 08/14/2016 1.0  1.0 - 3.6 K/uL Final  . Monocytes Relative 08/14/2016 8  % Final  .  Monocytes Absolute 08/14/2016 0.7  0.2 - 0.9 K/uL Final  . Eosinophils Relative 08/14/2016 1  % Final  . Eosinophils Absolute 08/14/2016 0.1  0 - 0.7 K/uL Final  . Basophils Relative 08/14/2016 0  % Final  . Basophils Absolute 08/14/2016 0.0  0 - 0.1 K/uL Final  . LDH 08/14/2016 196* 98 - 192 U/L Final  . Retic Ct Pct 08/14/2016 5.4* 0.4 - 3.1 % Final  . RBC. 08/14/2016 3.75* 3.80 - 5.20 MIL/uL Final  . Retic Count, Manual 08/14/2016 202.5* 19.0 - 183.0 K/uL Final  . 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 One week prior to presentation, she felt like she was  coming down with something. She denied any fever, runny nose, sore throat or cough. She had some diarrhea. She denied any new medications or herbal products.  Anemia workup revealed revealed a cold autoantibody (IgG and complement).  Reticulocyte count was 11.3%.  Ferritin was 562.  Iron studies revealed a saturation of 20% and a TIBC of 214 (low).  B12 was 231 (low normal).  Folate was 42.   Peripheral smear revealed rouleaux formation.  Additional testing included the following + studies:  hepatitis C antibody, hepatitis B core antibody, CMV IgM, and EBV VCA (IgM and IgG).  Hepatitis B by PCR was negative.  Hepatitis C RNA was negative.  Mycoplasma pneumonia IgM was negative.  Reticulocyte count was 11.3% (high) indicating appropriate marrow response.  C3 and C4 were normal.  Cold agglutinin titer was negative x 2.  Negative studies included:  ANA, hepatitis B surface antigen, SPEP, and free light chain ratio.   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  MMA was initially 330 (normal).  Repeat MMA was elevated on 08/01/2016 confirming B12 deficiency.  Hepatitis B surface antibody was positive and hepatitis B surface antigen was negative on 08/06/2016.  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 There was no adenopathy. Spleen was normal.  She underwent PTCA and stent placement in right and left common iliac arteries and left external iliac artery on 07/10/2016.  EGD on 03/19/2015 revealed gastritis in the body and antrum. Colonoscopy on 03/19/2015 revealed one hyperplastic polyp. EGD on 07/08/2016 was normal.  No evidence of bleeding.  She has received 6 units of warmed PRBCs to date (last on 08/01/2016).   She began steroids (1 mg/kg) on 08/01/2016.  She is currently on  prednisone 65 mg a day.  She is on folic acid 1 mg a day.  She received B12 1000 mcg IM on 08/06/2016.  She is on oral B12.  Symptomatically, her energy level fluctuates.  Hematocrit has normalized.  Reticulocyte count and bilirubin remain elevated.  Plan: 1.  Labs today:  CBC with diff, LDH, bilirubin, retic, hold tube. 2.  Obtain notes from Dr. Allen Norris consult. 3.  No Rituxan today.   4.  Decrease prednisone to 55 mg tomorrow. 5.  RTC in 1 week for labs (CBC with diff) 6.  RTC in 2 weeks for MD assessment and labs (CBC with diff, CMP, LDH, retic, hold tube).   Lequita Asal, MD  08/14/2016, 9:03 AM

## 2016-08-14 ENCOUNTER — Inpatient Hospital Stay: Payer: BLUE CROSS/BLUE SHIELD

## 2016-08-14 ENCOUNTER — Encounter: Payer: Self-pay | Admitting: Hematology and Oncology

## 2016-08-14 ENCOUNTER — Inpatient Hospital Stay (HOSPITAL_BASED_OUTPATIENT_CLINIC_OR_DEPARTMENT_OTHER): Payer: BLUE CROSS/BLUE SHIELD | Admitting: Hematology and Oncology

## 2016-08-14 VITALS — BP 156/92 | HR 68 | Temp 97.4°F | Resp 18 | Ht 63.0 in | Wt 144.0 lb

## 2016-08-14 DIAGNOSIS — B1920 Unspecified viral hepatitis C without hepatic coma: Secondary | ICD-10-CM | POA: Diagnosis not present

## 2016-08-14 DIAGNOSIS — E538 Deficiency of other specified B group vitamins: Secondary | ICD-10-CM

## 2016-08-14 DIAGNOSIS — Z79899 Other long term (current) drug therapy: Secondary | ICD-10-CM

## 2016-08-14 DIAGNOSIS — D591 Other autoimmune hemolytic anemias: Secondary | ICD-10-CM

## 2016-08-14 DIAGNOSIS — D890 Polyclonal hypergammaglobulinemia: Secondary | ICD-10-CM

## 2016-08-14 DIAGNOSIS — B1910 Unspecified viral hepatitis B without hepatic coma: Secondary | ICD-10-CM | POA: Diagnosis not present

## 2016-08-14 DIAGNOSIS — D5912 Cold autoimmune hemolytic anemia: Secondary | ICD-10-CM

## 2016-08-14 LAB — CBC WITH DIFFERENTIAL/PLATELET
Basophils Absolute: 0 10*3/uL (ref 0–0.1)
Basophils Relative: 0 %
Eosinophils Absolute: 0.1 10*3/uL (ref 0–0.7)
Eosinophils Relative: 1 %
HCT: 36.1 % (ref 35.0–47.0)
Hemoglobin: 12.5 g/dL (ref 12.0–16.0)
Lymphocytes Relative: 12 %
Lymphs Abs: 1 10*3/uL (ref 1.0–3.6)
MCH: 33.3 pg (ref 26.0–34.0)
MCHC: 34.6 g/dL (ref 32.0–36.0)
MCV: 96.4 fL (ref 80.0–100.0)
Monocytes Absolute: 0.7 10*3/uL (ref 0.2–0.9)
Monocytes Relative: 8 %
Neutro Abs: 6.3 10*3/uL (ref 1.4–6.5)
Neutrophils Relative %: 79 %
Platelets: 145 10*3/uL — ABNORMAL LOW (ref 150–440)
RBC: 3.75 MIL/uL — ABNORMAL LOW (ref 3.80–5.20)
RDW: 22.8 % — ABNORMAL HIGH (ref 11.5–14.5)
WBC: 8.1 10*3/uL (ref 3.6–11.0)

## 2016-08-14 LAB — RETICULOCYTES
RBC.: 3.75 MIL/uL — ABNORMAL LOW (ref 3.80–5.20)
Retic Count, Absolute: 202.5 10*3/uL — ABNORMAL HIGH (ref 19.0–183.0)
Retic Ct Pct: 5.4 % — ABNORMAL HIGH (ref 0.4–3.1)

## 2016-08-14 LAB — BILIRUBIN, TOTAL: Total Bilirubin: 1.5 mg/dL — ABNORMAL HIGH (ref 0.3–1.2)

## 2016-08-14 LAB — SAMPLE TO BLOOD BANK

## 2016-08-14 LAB — LACTATE DEHYDROGENASE: LDH: 196 U/L — ABNORMAL HIGH (ref 98–192)

## 2016-08-14 NOTE — Progress Notes (Signed)
Pt states that the prednisone did help energy in hte beginning but now it comes and goes with spurts of energy. She does have sinus infection and was put on atb

## 2016-08-17 NOTE — Progress Notes (Signed)
Gastroenterology Consultation  Referring Provider:     Frazier Richards, MD Primary Care Physician:  Frazier Richards, MD Primary Gastroenterologist:  Dr. Allen Norris     Reason for Consultation:     Reported to have hepatitis B and hepatitis C        HPI:   Tina Page is a 60 y.o. y/o female referred for consultation & management of Questionable hepatitis B and C by Dr. Frazier Richards, MD.  This patient comes in today with a history of thrombocytopenia and the thought to be put on Rituxan. The patient had labs sent off that showed her to have a hepatitis C antibody positive with a negative viral load. The patient also had a hepatitis C surface antigen that was negative. Further testing showed the patient to have a hepatitis B core antibody positive. The patient denies any high risk activity at the present time but did have high risk activity when she was younger. There is no report of any unexplained weight loss, fevers, chills, nausea or vomiting. I'm now being asked to see the patient for evaluation prior to starting Rituxan with her previous hepatitis history.  Past Medical History:  Diagnosis Date  . Anemia   . COPD (chronic obstructive pulmonary disease) ([place])   . HLD (hyperlipidemia)   . Hypertension     Past Surgical History:  Procedure Laterality Date  . ESOPHAGOGASTRODUODENOSCOPY (EGD) WITH PROPOFOL N/A 07/08/2016   Procedure: ESOPHAGOGASTRODUODENOSCOPY (EGD) WITH PROPOFOL;  Surgeon: Manya Silvas, MD;  Location: [facility] ENDOSCOPY;  Service: Endoscopy;  Laterality: N/A;  . KNEE SURGERY    . PERIPHERAL VASCULAR CATHETERIZATION N/A 07/10/2016   Procedure: Lower Extremity Angiography;  Surgeon: Katha Cabal, MD;  Location: [place] [facility];  Service: Cardiovascular;  Laterality: N/A;  . PERIPHERAL VASCULAR CATHETERIZATION N/A 07/10/2016   Procedure: Abdominal Aortogram w/Lower Extremity;  Surgeon: Katha Cabal, MD;  Location: [place] [facility];  Service:  Cardiovascular;  Laterality: N/A;  . PERIPHERAL VASCULAR CATHETERIZATION  07/10/2016   Procedure: Lower Extremity Intervention;  Surgeon: Katha Cabal, MD;  Location: [place] [facility];  Service: Cardiovascular;;    Prior to Admission medications   Medication Sig Start Date End Date Taking? Authorizing Provider  albuterol (PROVENTIL HFA;VENTOLIN HFA) 108 (90 Base) MCG/ACT inhaler Inhale into the lungs.   Yes Historical Provider, MD  clopidogrel (PLAVIX) 75 MG tablet Take 1 tablet (75 mg total) by mouth daily. 07/13/16  Yes Theodoro Grist, MD  docusate sodium (COLACE) 100 MG capsule Take 1 capsule (100 mg total) by mouth 2 (two) times daily. 07/12/16  Yes Theodoro Grist, MD  folic acid (FOLVITE) 1 MG tablet Take 1 tablet (1 mg total) by mouth daily. 08/03/16  Yes Fritzi Mandes, MD  lisinopril (PRINIVIL,ZESTRIL) 5 MG tablet Take 5 mg by mouth daily.   Yes Historical Provider, MD  omeprazole (PRILOSEC) 20 MG capsule Take 1 capsule (20 mg total) by mouth daily. 08/02/16  Yes Fritzi Mandes, MD  potassium chloride SA (K-DUR,KLOR-CON) 20 MEQ tablet Take by mouth.   Yes Historical Provider, MD  pravastatin (PRAVACHOL) 40 MG tablet Take 40 mg by mouth every evening.   Yes Historical Provider, MD  predniSONE (DELTASONE) 20 MG tablet 3 tablets a day, patient also taking 5 mg separate tablet for a total of 65 mg 08/06/16  Yes Lequita Asal, MD  predniSONE (DELTASONE) 5 MG tablet Take 13 tablets (65 mg total) by mouth daily with breakfast. 08/03/16  Yes Fritzi Mandes, MD  predniSONE (DELTASONE) 5 MG tablet Take 1 tablet daily, patient also taking a dose of 60 mg for a total of 65 mg daily 08/06/16  Yes Lequita Asal, MD  traMADol (ULTRAM) 50 MG tablet Take 1 tablet (50 mg total) by mouth every 6 (six) hours as needed for moderate pain or severe pain. 07/12/16  Yes Theodoro Grist, MD  amoxicillin-clavulanate (AUGMENTIN) 875-125 MG tablet Take 1 tablet by mouth 2 (two) times daily.    Historical Provider, MD     Family History  Problem Relation Age of Onset  . Diabetes Mother   . Hypertension Mother   . Diabetes Maternal Grandfather   . Hypertension Maternal Grandfather      Social History  Substance Use Topics  . Smoking status: Former Smoker    Packs/day: 0.50    Types: Cigarettes    Quit date: 07/11/2016  . Smokeless tobacco: Never Used  . Alcohol use No    Allergies as of 08/10/2016 - Review Complete 08/10/2016  Allergen Reaction Noted  . Codeine Anaphylaxis 07/07/2016    Review of Systems:    All systems reviewed and negative except where noted in HPI.   Physical Exam:  BP 140/62   Pulse 70   Temp 98.3 F (36.8 C) (Oral)   Ht 5\' 3"  (1.6 m)   Wt 147 lb 8 oz (66.9 kg)   BMI 26.13 kg/m  No LMP recorded. Patient is postmenopausal. Psych:  Alert and cooperative. Normal mood and affect. General:   Alert,  Well-developed, well-nourished, pleasant and cooperative in NAD Head:  Normocephalic and atraumatic. Eyes:  Sclera clear, no icterus.   Conjunctiva pink. Ears:  Normal auditory acuity. Nose:  No deformity, discharge, or lesions. Mouth:  No deformity or lesions,oropharynx pink & moist. Neck:  Supple; no masses or thyromegaly. Lungs:  Respirations even and unlabored.  Clear throughout to auscultation.   No wheezes, crackles, or rhonchi. No acute distress. Heart:  Regular rate and rhythm; no murmurs, clicks, rubs, or gallops. Abdomen:  Normal bowel sounds.  No bruits.  Soft, non-tender and non-distended without masses, hepatosplenomegaly or hernias noted.  No guarding or rebound tenderness.  Negative Carnett sign.   Rectal:  Deferred.  Msk:  Symmetrical without gross deformities.  Good, equal movement & strength bilaterally. Pulses:  Normal pulses noted. Extremities:  No clubbing or edema.  No cyanosis. Neurologic:  Alert and oriented x3;  grossly normal neurologically. Skin:  Intact without significant lesions or rashes.  No jaundice. Lymph Nodes:  No significant  cervical adenopathy. Psych:  Alert and cooperative. Normal mood and affect.  Imaging Studies: No results found.  Assessment and Plan:   Tina Page is a 60 y.o. y/o female who comes in today with a history of hepatitis C antibody positive with a negative viral load most consistent with resolved infection. This should not impact any further treatment with Rituxan. As for the hepatitis B, the patient was found to have a negative antigen and a negative DNA a positive core antibody. This is consistent with the patient having been exposed to hepatitis B in the past. With this history the patient should be on entecavir 0.5 mg per day this should be started at least 2 weeks prior to starting the Rituxan and continued for 2 months after completing the treatment with Rituxan. The patient should also have her hepatitis B viral level checked monthly as well as her liver enzymes.    Lucilla Lame, MD. Marval Regal  Note: This dictation was prepared with Dragon dictation along with smaller phrase technology. Any transcriptional errors that result from this process are unintentional.

## 2016-08-21 ENCOUNTER — Inpatient Hospital Stay: Payer: BLUE CROSS/BLUE SHIELD

## 2016-08-21 ENCOUNTER — Telehealth: Payer: Self-pay | Admitting: *Deleted

## 2016-08-21 DIAGNOSIS — D591 Other autoimmune hemolytic anemias: Secondary | ICD-10-CM | POA: Diagnosis not present

## 2016-08-21 DIAGNOSIS — E538 Deficiency of other specified B group vitamins: Secondary | ICD-10-CM

## 2016-08-21 DIAGNOSIS — D5912 Cold autoimmune hemolytic anemia: Secondary | ICD-10-CM

## 2016-08-21 LAB — CBC WITH DIFFERENTIAL/PLATELET
Basophils Absolute: 0.1 10*3/uL (ref 0–0.1)
Basophils Relative: 1 %
Eosinophils Absolute: 0.1 10*3/uL (ref 0–0.7)
Eosinophils Relative: 1 %
HCT: 38.3 % (ref 35.0–47.0)
Hemoglobin: 13.3 g/dL (ref 12.0–16.0)
Lymphocytes Relative: 5 %
Lymphs Abs: 0.5 10*3/uL — ABNORMAL LOW (ref 1.0–3.6)
MCH: 34.4 pg — ABNORMAL HIGH (ref 26.0–34.0)
MCHC: 34.7 g/dL (ref 32.0–36.0)
MCV: 99.1 fL (ref 80.0–100.0)
Monocytes Absolute: 0.4 10*3/uL (ref 0.2–0.9)
Monocytes Relative: 5 %
Neutro Abs: 8.1 10*3/uL — ABNORMAL HIGH (ref 1.4–6.5)
Neutrophils Relative %: 88 %
Platelets: 123 10*3/uL — ABNORMAL LOW (ref 150–440)
RBC: 3.87 MIL/uL (ref 3.80–5.20)
RDW: 20.9 % — ABNORMAL HIGH (ref 11.5–14.5)
WBC: 9.2 10*3/uL (ref 3.6–11.0)

## 2016-08-21 NOTE — Telephone Encounter (Signed)
Called patient to let her know that her platelets went from 145 to 123 and that Dr. Grayland Ormond recommended leaving her on the prednisone as ordered at 55 mg daily, pt voiced understanding.

## 2016-08-26 ENCOUNTER — Telehealth: Payer: Self-pay | Admitting: *Deleted

## 2016-08-26 NOTE — Telephone Encounter (Signed)
Called daughter because there was no answer at the patients numbers and no way to leave a message.  Daughter voiced understanding about decreasing prednisone to 50 mg daily, daughter stated that she would notify her mother.

## 2016-08-28 ENCOUNTER — Inpatient Hospital Stay (HOSPITAL_BASED_OUTPATIENT_CLINIC_OR_DEPARTMENT_OTHER): Payer: BLUE CROSS/BLUE SHIELD | Admitting: Hematology and Oncology

## 2016-08-28 ENCOUNTER — Inpatient Hospital Stay: Payer: BLUE CROSS/BLUE SHIELD

## 2016-08-28 VITALS — BP 153/82 | HR 84 | Temp 94.9°F | Resp 18 | Wt 154.5 lb

## 2016-08-28 DIAGNOSIS — D890 Polyclonal hypergammaglobulinemia: Secondary | ICD-10-CM

## 2016-08-28 DIAGNOSIS — D5912 Cold autoimmune hemolytic anemia: Secondary | ICD-10-CM

## 2016-08-28 DIAGNOSIS — E538 Deficiency of other specified B group vitamins: Secondary | ICD-10-CM

## 2016-08-28 DIAGNOSIS — B1920 Unspecified viral hepatitis C without hepatic coma: Secondary | ICD-10-CM | POA: Diagnosis not present

## 2016-08-28 DIAGNOSIS — D591 Other autoimmune hemolytic anemias: Secondary | ICD-10-CM

## 2016-08-28 DIAGNOSIS — D696 Thrombocytopenia, unspecified: Secondary | ICD-10-CM

## 2016-08-28 DIAGNOSIS — B1910 Unspecified viral hepatitis B without hepatic coma: Secondary | ICD-10-CM | POA: Diagnosis not present

## 2016-08-28 DIAGNOSIS — Z79899 Other long term (current) drug therapy: Secondary | ICD-10-CM

## 2016-08-28 LAB — CBC WITH DIFFERENTIAL/PLATELET
Basophils Absolute: 0.1 10*3/uL (ref 0–0.1)
Basophils Relative: 1 %
Eosinophils Absolute: 0.1 10*3/uL (ref 0–0.7)
Eosinophils Relative: 1 %
HCT: 39.7 % (ref 35.0–47.0)
Hemoglobin: 13.7 g/dL (ref 12.0–16.0)
Lymphocytes Relative: 19 %
Lymphs Abs: 1.3 10*3/uL (ref 1.0–3.6)
MCH: 34 pg (ref 26.0–34.0)
MCHC: 34.5 g/dL (ref 32.0–36.0)
MCV: 98.8 fL (ref 80.0–100.0)
Monocytes Absolute: 0.4 10*3/uL (ref 0.2–0.9)
Monocytes Relative: 6 %
Neutro Abs: 5.1 10*3/uL (ref 1.4–6.5)
Neutrophils Relative %: 73 %
Platelets: 107 10*3/uL — ABNORMAL LOW (ref 150–440)
RBC: 4.02 MIL/uL (ref 3.80–5.20)
RDW: 19.1 % — ABNORMAL HIGH (ref 11.5–14.5)
WBC: 6.9 10*3/uL (ref 3.6–11.0)

## 2016-08-28 LAB — LACTATE DEHYDROGENASE: LDH: 178 U/L (ref 98–192)

## 2016-08-28 LAB — COMPREHENSIVE METABOLIC PANEL
ALT: 19 U/L (ref 14–54)
AST: 16 U/L (ref 15–41)
Albumin: 3.8 g/dL (ref 3.5–5.0)
Alkaline Phosphatase: 42 U/L (ref 38–126)
Anion gap: 6 (ref 5–15)
BUN: 29 mg/dL — ABNORMAL HIGH (ref 6–20)
CO2: 29 mmol/L (ref 22–32)
Calcium: 9 mg/dL (ref 8.9–10.3)
Chloride: 104 mmol/L (ref 101–111)
Creatinine, Ser: 0.99 mg/dL (ref 0.44–1.00)
GFR calc Af Amer: >60 mL/min (ref >60)
GFR calc non Af Amer: >60 mL/min (ref >60)
Glucose, Bld: 81 mg/dL (ref 65–99)
Potassium: 3.9 mmol/L (ref 3.5–5.1)
Sodium: 139 mmol/L (ref 135–145)
Total Bilirubin: 0.9 mg/dL (ref 0.3–1.2)
Total Protein: 6.2 g/dL — ABNORMAL LOW (ref 6.5–8.1)

## 2016-08-28 LAB — RETICULOCYTES
RBC.: 4.02 MIL/uL (ref 3.80–5.20)
Retic Count, Absolute: 188.9 10*3/uL — ABNORMAL HIGH (ref 19.0–183.0)
Retic Ct Pct: 4.7 % — ABNORMAL HIGH (ref 0.4–3.1)

## 2016-08-28 LAB — SAMPLE TO BLOOD BANK

## 2016-08-28 NOTE — Progress Notes (Addendum)
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  HPI:  The patient was last seen in the hematology clinic on 08/14/2016.  At that time, her hematocrit had improved from 31.2 to 36.1.  LDH was 196.  Bilirubin was 1.5.  Retic was 5.4%.  Prednisone was decreased to 55 mg a day.  Labs on 08/21/2016 revealed a hematocrit of 38.3, hemoglobin of 13.3, and platelets 123,000.  In my absence, decision was made to maintain her prednisone dosing.  Prednisone was decreased to 50 mg a day on 08/27/2016.  She saw Dr. Allen Norris on 08/10/2016.  If Rituxan is needed, she is felt to need entecavir 0.5 mg q day beginning 2 weeks prior to Rituxan.  In addition, hepatitis B viral level and LFTs should be checked monthly.  Symptomatically, she notes shortness of breath on exertion.  She notes facial swelling secondary to steroids.  Weight is up 11 pounds.  She denies any thrush.   Past Medical History:  Diagnosis Date  . Anemia   . COPD (chronic obstructive pulmonary disease) ([place])   . HLD (hyperlipidemia)   . Hypertension     Past Surgical History:  Procedure Laterality Date  . ESOPHAGOGASTRODUODENOSCOPY (EGD) WITH PROPOFOL N/A 07/08/2016   Procedure: ESOPHAGOGASTRODUODENOSCOPY (EGD) WITH PROPOFOL;  Surgeon: Manya Silvas, MD;  Location[facility] ENDOSCOPY;  Service: Endoscopy;  Laterality: N/A;  . KNEE SURGERY    . PERIPHERAL VASCULAR CATHETERIZATION N/A 07/10/2016   Procedure: Lower Extremity Angiography;  Surgeon: Katha Cabal, MD;  Location: [facility];  Service: Cardiovascular;  Laterality: N/A;  . PERIPHERAL VASCULAR CATHETERIZATION N/A 07/10/2016   Procedure: Abdominal Aortogram w/Lower Extremity;  Surgeon: Katha Cabal, MD;  Location: [facility];  Service: Cardiovascular;  Laterality: N/A;  . PERIPHERAL VASCULAR  CATHETERIZATION  07/10/2016   Procedure: Lower Extremity Intervention;  Surgeon: Katha Cabal, MD;  Location: [facility];  Service: Cardiovascular;;    Family History  Problem Relation Age of Onset  . Diabetes Mother   . Hypertension Mother   . Diabetes Maternal Grandfather   . Hypertension Maternal Grandfather     Social History:  reports that she quit smoking about 6 weeks ago. Her smoking use included Cigarettes. She smoked 0.50 packs per day. She has never used smokeless tobacco. She reports that she does not drink alcohol or use drugs.  The patient works at [employer]  She is exposed to cold temperatures.  She has a daughter named, [name] and a daughter named [name].  She is accompanied by her daughter today.  Allergies:  Allergies  Allergen Reactions  . Codeine Anaphylaxis    Current Medications: Current Outpatient Prescriptions  Medication Sig Dispense Refill  . albuterol (PROVENTIL HFA;VENTOLIN HFA) 108 (90 Base) MCG/ACT inhaler Inhale into the lungs.    [name] Kitchen amoxicillin-clavulanate (AUGMENTIN) 875-125 MG tablet Take 1 tablet by mouth 2 (two) times daily.    . clopidogrel (PLAVIX) 75 MG tablet Take 1 tablet (75 mg total) by mouth daily. 30 tablet 5  . docusate sodium (COLACE) 100 MG capsule Take 1 capsule (100 mg total) by mouth 2 (two) times daily. 10 capsule 0  . folic acid (FOLVITE) 1 MG tablet Take 1 tablet (1 mg total) by mouth daily. 30 tablet 0  . lisinopril (PRINIVIL,ZESTRIL) 5 MG tablet Take 5  mg by mouth daily.    [name] Kitchen omeprazole (PRILOSEC) 20 MG capsule Take 1 capsule (20 mg total) by mouth daily. 60 capsule 0  . potassium chloride SA (K-DUR,KLOR-CON) 20 MEQ tablet Take by mouth.    . pravastatin (PRAVACHOL) 40 MG tablet Take 40 mg by mouth every evening.    . predniSONE (DELTASONE) 20 MG tablet 3 tablets a day, patient also taking 5 mg separate tablet for a total of 65 mg 90 tablet 0  . predniSONE (DELTASONE) 5 MG tablet Take 13 tablets  (65 mg total) by mouth daily with breakfast. 50 tablet 1  . predniSONE (DELTASONE) 5 MG tablet Take 1 tablet daily, patient also taking a dose of 60 mg for a total of 65 mg daily 30 tablet 0  . traMADol (ULTRAM) 50 MG tablet Take 1 tablet (50 mg total) by mouth every 6 (six) hours as needed for moderate pain or severe pain. 30 tablet 0   No current facility-administered medications for this visit.     Review of Systems:  GENERAL: Energy level up in spurts. No fevers or sweats.  Weight gain of 11 pounds. PERFORMANCE STATUS (ECOG): 1 HEENT: No visual changes, sore throat, mouth sores or tenderness. Lungs: Shortness of breath on exertion.  No cough. No hemoptysis. Cardiac: No chest pain, palpitations, orthopnea, or PND. GI: No nausea, vomiting, diarrhea, constipation, melena or hematochezia. GU: No urgency, frequency, dysuria, or hematuria. Musculoskeletal: No back pain. No joint pain. No muscle tenderness. Extremities: No pain or swelling. Skin: Discoid lupus. No rashes or skin changes. Neuro: No headache, weakness, balance or coordination issues. Endocrine: No diabetes, thyroid issues, hot flashes or night sweats. Psych: No mood changes, depression or anxiety. Pain: No focal pain. Review of systems: All other systems reviewed and found to be negative.  Physical Exam: Blood pressure (!) 155/77, pulse 84, temperature (!) 94.9 F (34.9 C), temperature source Tympanic, resp. rate 18, weight 154 lb 8.7 oz (70.1 kg). GENERAL:Well developed, well nourished, woman sitting comfortably in the exam room in no acute distress. MENTAL STATUS: Alert and oriented to person, place and time. HEAD:Long gray hair. Normocephalic, atraumatic, face symmetric with slight Cushingoid features. EYES:Glasses.  Blue eyes.  Pupils equal round and reactive to light and accomodation. No conjunctivitis or scleral icterus. EYC:[id number] clear without lesion. Tonguenormal. Mucous  membranes moist. RESPIRATORY:Clear to auscultationwithout rales, wheezes or rhonchi. CARDIOVASCULAR:Regular rate andrhythmwithout murmur, rub or gallop. ABDOMEN:Soft, non-tender, with active bowel sounds, and no hepatosplenomegaly. No masses. SKIN: No rashes or ulcers. EXTREMITIES: No edema, no skin discoloration or tenderness. No palpable cords. LYMPHNODES: No palpable cervical, supraclavicular, axillary or inguinal adenopathy  NEUROLOGICAL: Unremarkable.   Appointment on 08/28/2016  Component Date Value Ref Range Status  . WBC 08/28/2016 6.9  3.6 - 11.0 K/uL Final  . RBC 08/28/2016 4.02  3.80 - 5.20 MIL/uL Final  . Hemoglobin 08/28/2016 13.7  12.0 - 16.0 g/dL Final  . HCT 08/28/2016 39.7  35.0 - 47.0 % Final  . MCV 08/28/2016 98.8  80.0 - 100.0 fL Final  . MCH 08/28/2016 34.0  26.0 - 34.0 pg Final  . MCHC 08/28/2016 34.5  32.0 - 36.0 g/dL Final  . RDW 08/28/2016 19.1* 11.5 - 14.5 % Final  . Platelets 08/28/2016 107* 150 - 440 K/uL Final  . Neutrophils Relative % 08/28/2016 73  % Final  . Neutro Abs 08/28/2016 5.1  1.4 - 6.5 K/uL Final  . Lymphocytes Relative 08/28/2016 19  % Final  . Lymphs Abs 08/28/2016 1.3  1.0 - 3.6 K/uL Final  . Monocytes Relative 08/28/2016 6  % Final  . Monocytes Absolute 08/28/2016 0.4  0.2 - 0.9 K/uL Final  . Eosinophils Relative 08/28/2016 1  % Final  . Eosinophils Absolute 08/28/2016 0.1  0 - 0.7 K/uL Final  . Basophils Relative 08/28/2016 1  % Final  . Basophils Absolute 08/28/2016 0.1  0 - 0.1 K/uL Final  . Sodium 08/28/2016 139  135 - 145 mmol/L Final  . Potassium 08/28/2016 3.9  3.5 - 5.1 mmol/L Final  . Chloride 08/28/2016 104  101 - 111 mmol/L Final  . CO2 08/28/2016 29  22 - 32 mmol/L Final  . Glucose, Bld 08/28/2016 81  65 - 99 mg/dL Final  . BUN 08/28/2016 29* 6 - 20 mg/dL Final  . Creatinine, Ser 08/28/2016 0.99  0.44 - 1.00 mg/dL Final  . Calcium 08/28/2016 9.0  8.9 - 10.3 mg/dL Final  . Total Protein 08/28/2016 6.2* 6.5 -  8.1 g/dL Final  . Albumin 08/28/2016 3.8  3.5 - 5.0 g/dL Final  . AST 08/28/2016 16  15 - 41 U/L Final  . ALT 08/28/2016 19  14 - 54 U/L Final  . Alkaline Phosphatase 08/28/2016 42  38 - 126 U/L Final  . Total Bilirubin 08/28/2016 0.9  0.3 - 1.2 mg/dL Final  . GFR calc non Af Amer 08/28/2016 >60  >60 mL/min Final  . GFR calc Af Amer 08/28/2016 >60  >60 mL/min Final   Comment: (NOTE) The eGFR has been calculated using the CKD EPI equation. This calculation has not been validated in all clinical situations. eGFR's persistently <60 mL/min signify possible Chronic Kidney Disease.   . Anion gap 08/28/2016 6  5 - 15 Final  . LDH 08/28/2016 178  98 - 192 U/L Final  . Retic Ct Pct 08/28/2016 4.7* 0.4 - 3.1 % Final  . RBC. 08/28/2016 4.02  3.80 - 5.20 MIL/uL Final  . 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 One week prior to presentation, she felt like she was coming down with something. She denied any fever, runny nose, sore throat or cough. She had some diarrhea. She denied any new medications or herbal products.  Anemia workup revealed revealed a cold autoantibody (IgG and complement).  Reticulocyte count was 11.3%.  Ferritin was 562.  Iron studies revealed a saturation of 20% and a TIBC of 214 (low).  B12 was 231 (low normal).  Folate was 42.   Peripheral smear revealed rouleaux formation.  Additional testing included the following + studies:  hepatitis C antibody, hepatitis B core antibody, CMV IgM, and EBV VCA (IgM and IgG).  Hepatitis B by PCR was negative.  Hepatitis C RNA was negative.  Mycoplasma pneumonia IgM was negative.  Reticulocyte count was 11.3% (high) indicating appropriate marrow response.  C3 and C4 were normal.  Cold agglutinin titer was negative x 2.  Negative studies included:  ANA,  hepatitis B surface antigen, SPEP, and free light chain ratio.   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  MMA was initially 330 (normal).  Repeat MMA was elevated on 08/01/2016 confirming B12 deficiency.  Hepatitis B surface antibody was positive and hepatitis B surface antigen was negative on 08/06/2016.  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 There was no adenopathy. Spleen was normal.  She underwent PTCA and stent placement in right and left common iliac arteries and left external iliac artery on 07/10/2016.  She is on Plavix.  EGD on 03/19/2015 revealed gastritis in the body and antrum. Colonoscopy on 03/19/2015 revealed one hyperplastic polyp. EGD on 07/08/2016 was normal.  No evidence of bleeding.  She has received 6 units of warmed PRBCs to date (last on 08/01/2016).  If Rituxan is needed, she requires entecavir 0.5 mg q day beginning 2 weeks prior to Rituxan.  In addition, hepatitis B viral level and LFTs should be checked monthly.  She began steroids (1 mg/kg) on 08/01/2016.  She is currently on prednisone 50 mg a day.  She is on folic acid 1 mg a day.  She received B12 1000 mcg IM on 08/06/2016.  She is on oral B12.  Symptomatically, her energy level fluctuates.  Hematocrit is normal.  She has mild thrombocytopenia.  She has gained 11 pounds.  Plan: 1.  Labs today:  CBC with diff, CMP, LDH, G6PD assay. 2.  Review notes from Dr. Allen Norris consult. 3.  Decrease prednisone to 45 mg a day alternating with 40 mg a day. 4.  Discuss weight gain and diet. 5.  RTC weekly x 3 for CBC with diff and ongoing steroid taper. 6.  RTC in 4 weeks for MD assessment and labs (CBC with diff, CMP, retic, LDH).   Lequita Asal, MD  08/28/2016, 9:04 AM

## 2016-08-28 NOTE — Progress Notes (Signed)
BP elevated. 158/80 HR 82.  Recheck 155/77 HR 84.

## 2016-08-30 LAB — GLUCOSE 6 PHOSPHATE DEHYDROGENASE
G6PDH: 11.3 U/g{Hb} (ref 4.6–13.5)
Hemoglobin: 13.9 g/dL (ref 11.1–15.9)

## 2016-09-04 ENCOUNTER — Telehealth: Payer: Self-pay | Admitting: *Deleted

## 2016-09-04 ENCOUNTER — Inpatient Hospital Stay: Payer: BLUE CROSS/BLUE SHIELD | Attending: Hematology and Oncology

## 2016-09-04 ENCOUNTER — Other Ambulatory Visit: Payer: Self-pay

## 2016-09-04 ENCOUNTER — Other Ambulatory Visit: Payer: Self-pay | Admitting: *Deleted

## 2016-09-04 DIAGNOSIS — D591 Other autoimmune hemolytic anemias: Principal | ICD-10-CM

## 2016-09-04 DIAGNOSIS — D538 Other specified nutritional anemias: Secondary | ICD-10-CM | POA: Diagnosis not present

## 2016-09-04 DIAGNOSIS — D890 Polyclonal hypergammaglobulinemia: Secondary | ICD-10-CM | POA: Diagnosis not present

## 2016-09-04 DIAGNOSIS — I10 Essential (primary) hypertension: Secondary | ICD-10-CM | POA: Diagnosis not present

## 2016-09-04 DIAGNOSIS — B1920 Unspecified viral hepatitis C without hepatic coma: Secondary | ICD-10-CM | POA: Insufficient documentation

## 2016-09-04 DIAGNOSIS — D598 Other acquired hemolytic anemias: Secondary | ICD-10-CM

## 2016-09-04 DIAGNOSIS — J449 Chronic obstructive pulmonary disease, unspecified: Secondary | ICD-10-CM | POA: Insufficient documentation

## 2016-09-04 DIAGNOSIS — E785 Hyperlipidemia, unspecified: Secondary | ICD-10-CM | POA: Insufficient documentation

## 2016-09-04 DIAGNOSIS — Z87891 Personal history of nicotine dependence: Secondary | ICD-10-CM | POA: Diagnosis not present

## 2016-09-04 DIAGNOSIS — D5912 Cold autoimmune hemolytic anemia: Secondary | ICD-10-CM

## 2016-09-04 DIAGNOSIS — B1910 Unspecified viral hepatitis B without hepatic coma: Secondary | ICD-10-CM | POA: Diagnosis not present

## 2016-09-04 DIAGNOSIS — L930 Discoid lupus erythematosus: Secondary | ICD-10-CM | POA: Diagnosis not present

## 2016-09-04 DIAGNOSIS — Z79899 Other long term (current) drug therapy: Secondary | ICD-10-CM | POA: Insufficient documentation

## 2016-09-04 DIAGNOSIS — Z955 Presence of coronary angioplasty implant and graft: Secondary | ICD-10-CM | POA: Insufficient documentation

## 2016-09-04 LAB — CBC WITH DIFFERENTIAL/PLATELET
Basophils Absolute: 0 10*3/uL (ref 0–0.1)
Basophils Relative: 1 %
Eosinophils Absolute: 0.1 10*3/uL (ref 0–0.7)
Eosinophils Relative: 1 %
HCT: 40.8 % (ref 35.0–47.0)
Hemoglobin: 14 g/dL (ref 12.0–16.0)
Lymphocytes Relative: 11 %
Lymphs Abs: 0.8 10*3/uL — ABNORMAL LOW (ref 1.0–3.6)
MCH: 33.7 pg (ref 26.0–34.0)
MCHC: 34.3 g/dL (ref 32.0–36.0)
MCV: 98.4 fL (ref 80.0–100.0)
Monocytes Absolute: 0.5 10*3/uL (ref 0.2–0.9)
Monocytes Relative: 6 %
Neutro Abs: 5.8 10*3/uL (ref 1.4–6.5)
Neutrophils Relative %: 81 %
Platelets: 117 10*3/uL — ABNORMAL LOW (ref 150–440)
RBC: 4.15 MIL/uL (ref 3.80–5.20)
RDW: 17.6 % — ABNORMAL HIGH (ref 11.5–14.5)
WBC: 7.2 10*3/uL (ref 3.6–11.0)

## 2016-09-04 MED ORDER — FOLIC ACID 1 MG PO TABS: 1.0000 mg | ORAL_TABLET | Freq: Every day | ORAL | 1 refills | Status: DC

## 2016-09-04 NOTE — Addendum Note (Signed)
Addended by: Betti Cruz on: 09/04/2016 02:11 PM   Modules accepted: Orders

## 2016-09-04 NOTE — Telephone Encounter (Signed)
Done

## 2016-09-04 NOTE — Telephone Encounter (Signed)
  OK to refill  M 

## 2016-09-04 NOTE — Telephone Encounter (Signed)
Patient requesting refill for Folic Acid 1 mg.

## 2016-09-04 NOTE — Telephone Encounter (Signed)
Called patient to let her know her hgb is 14.  Also per VO MD wants to change her prednisone dosage to alternating 30 mg and 35 mg every other day.  Patient previously has been taking 40 mg and 45 mg.  Verbalized understanding.  Also informed her that her folic acid has been escribed to her pharmacy.

## 2016-09-11 ENCOUNTER — Inpatient Hospital Stay: Payer: BLUE CROSS/BLUE SHIELD

## 2016-09-11 ENCOUNTER — Other Ambulatory Visit: Payer: Self-pay | Admitting: *Deleted

## 2016-09-11 ENCOUNTER — Telehealth: Payer: Self-pay | Admitting: *Deleted

## 2016-09-11 DIAGNOSIS — D591 Other autoimmune hemolytic anemias: Principal | ICD-10-CM

## 2016-09-11 DIAGNOSIS — D5912 Cold autoimmune hemolytic anemia: Secondary | ICD-10-CM

## 2016-09-11 DIAGNOSIS — B1910 Unspecified viral hepatitis B without hepatic coma: Secondary | ICD-10-CM

## 2016-09-11 DIAGNOSIS — E538 Deficiency of other specified B group vitamins: Secondary | ICD-10-CM

## 2016-09-11 LAB — CBC WITH DIFFERENTIAL/PLATELET
Basophils Absolute: 0 10*3/uL (ref 0–0.1)
Basophils Relative: 1 %
Eosinophils Absolute: 0.1 10*3/uL (ref 0–0.7)
Eosinophils Relative: 1 %
HCT: 42.4 % (ref 35.0–47.0)
Hemoglobin: 14.5 g/dL (ref 12.0–16.0)
Lymphocytes Relative: 16 %
Lymphs Abs: 1.2 10*3/uL (ref 1.0–3.6)
MCH: 33.8 pg (ref 26.0–34.0)
MCHC: 34.3 g/dL (ref 32.0–36.0)
MCV: 98.6 fL (ref 80.0–100.0)
Monocytes Absolute: 0.6 10*3/uL (ref 0.2–0.9)
Monocytes Relative: 8 %
Neutro Abs: 5.6 10*3/uL (ref 1.4–6.5)
Neutrophils Relative %: 74 %
Platelets: 135 10*3/uL — ABNORMAL LOW (ref 150–440)
RBC: 4.3 MIL/uL (ref 3.80–5.20)
RDW: 16.7 % — ABNORMAL HIGH (ref 11.5–14.5)
WBC: 7.5 10*3/uL (ref 3.6–11.0)

## 2016-09-11 MED ORDER — PREDNISONE 20 MG PO TABS: ORAL_TABLET | ORAL | 2 refills | Status: DC

## 2016-09-11 NOTE — Telephone Encounter (Signed)
Called patient to inform her that her blood work looks good. Patient should decrease her dosage from alternating 30 mg and 35 mg every other day to 20 mg daily.  Patient verbalized understanding.

## 2016-09-11 NOTE — Telephone Encounter (Signed)
-----   Message from Lequita Asal, MD sent at 09/11/2016 12:36 PM EST ----- Regarding: Please call patient  Blood counts good.  Decrease prednisone from 30 mg alternating with 35 mg a day to  20 mg a day  M  ----- Message ----- From: Interface, Lab In [place] Sent: 09/11/2016  10:14 AM To: Lequita Asal, MD

## 2016-09-18 ENCOUNTER — Inpatient Hospital Stay: Payer: BLUE CROSS/BLUE SHIELD

## 2016-09-25 ENCOUNTER — Inpatient Hospital Stay: Payer: BLUE CROSS/BLUE SHIELD

## 2016-09-25 ENCOUNTER — Ambulatory Visit: Payer: BLUE CROSS/BLUE SHIELD | Admitting: Hematology and Oncology

## 2016-09-25 ENCOUNTER — Other Ambulatory Visit: Payer: BLUE CROSS/BLUE SHIELD

## 2016-09-25 DIAGNOSIS — D591 Other autoimmune hemolytic anemias: Secondary | ICD-10-CM | POA: Diagnosis not present

## 2016-09-25 DIAGNOSIS — D5912 Cold autoimmune hemolytic anemia: Secondary | ICD-10-CM

## 2016-09-25 LAB — COMPREHENSIVE METABOLIC PANEL
ALT: 18 U/L (ref 14–54)
AST: 18 U/L (ref 15–41)
Albumin: 3.9 g/dL (ref 3.5–5.0)
Alkaline Phosphatase: 59 U/L (ref 38–126)
Anion gap: 7 (ref 5–15)
BUN: 25 mg/dL — ABNORMAL HIGH (ref 6–20)
CO2: 28 mmol/L (ref 22–32)
Calcium: 9.1 mg/dL (ref 8.9–10.3)
Chloride: 103 mmol/L (ref 101–111)
Creatinine, Ser: 1.12 mg/dL — ABNORMAL HIGH (ref 0.44–1.00)
GFR calc Af Amer: >60 mL/min (ref >60)
GFR calc non Af Amer: 53 mL/min — ABNORMAL LOW (ref >60)
Glucose, Bld: 88 mg/dL (ref 65–99)
Potassium: 3.7 mmol/L (ref 3.5–5.1)
Sodium: 138 mmol/L (ref 135–145)
Total Bilirubin: 0.7 mg/dL (ref 0.3–1.2)
Total Protein: 7 g/dL (ref 6.5–8.1)

## 2016-09-25 LAB — CBC WITH DIFFERENTIAL/PLATELET
Basophils Absolute: 0.1 10*3/uL (ref 0–0.1)
Basophils Relative: 1 %
Eosinophils Absolute: 0.1 10*3/uL (ref 0–0.7)
Eosinophils Relative: 1 %
HCT: 41.5 % (ref 35.0–47.0)
Hemoglobin: 14.5 g/dL (ref 12.0–16.0)
Lymphocytes Relative: 14 %
Lymphs Abs: 1.2 10*3/uL (ref 1.0–3.6)
MCH: 33.3 pg (ref 26.0–34.0)
MCHC: 35 g/dL (ref 32.0–36.0)
MCV: 95.2 fL (ref 80.0–100.0)
Monocytes Absolute: 0.6 10*3/uL (ref 0.2–0.9)
Monocytes Relative: 8 %
Neutro Abs: 6.4 10*3/uL (ref 1.4–6.5)
Neutrophils Relative %: 76 %
Platelets: 146 10*3/uL — ABNORMAL LOW (ref 150–440)
RBC: 4.36 MIL/uL (ref 3.80–5.20)
RDW: 14.9 % — ABNORMAL HIGH (ref 11.5–14.5)
WBC: 8.3 10*3/uL (ref 3.6–11.0)

## 2016-09-25 LAB — RETICULOCYTES
RBC.: 4.36 MIL/uL (ref 3.80–5.20)
Retic Count, Absolute: 196.2 10*3/uL — ABNORMAL HIGH (ref 19.0–183.0)
Retic Ct Pct: 4.5 % — ABNORMAL HIGH (ref 0.4–3.1)

## 2016-09-25 LAB — LACTATE DEHYDROGENASE: LDH: 225 U/L — ABNORMAL HIGH (ref 98–192)

## 2016-09-30 ENCOUNTER — Other Ambulatory Visit: Payer: Self-pay | Admitting: *Deleted

## 2016-09-30 DIAGNOSIS — Z862 Personal history of diseases of the blood and blood-forming organs and certain disorders involving the immune mechanism: Secondary | ICD-10-CM

## 2016-10-01 ENCOUNTER — Telehealth: Payer: Self-pay | Admitting: *Deleted

## 2016-10-01 NOTE — Telephone Encounter (Signed)
-----   Message from Lequita Asal, MD sent at 10/01/2016  3:37 PM EST ----- Regarding: Please call patient  How much prednisone if she taking and how long has she been at that dose?  Let me know so I can decreased dose.  M  ----- Message ----- From: Interface, Lab In [place] Sent: 09/25/2016  10:34 AM To: Lequita Asal, MD

## 2016-10-01 NOTE — Telephone Encounter (Signed)
Called patient and LVM for her to call back to let me know the dosage of prednisone and how long she has been taking that dose.  MD will need to adjust.  Awaiting call back.

## 2016-10-02 ENCOUNTER — Other Ambulatory Visit: Payer: Self-pay

## 2016-10-02 ENCOUNTER — Inpatient Hospital Stay: Payer: BLUE CROSS/BLUE SHIELD

## 2016-10-02 ENCOUNTER — Inpatient Hospital Stay (HOSPITAL_BASED_OUTPATIENT_CLINIC_OR_DEPARTMENT_OTHER): Payer: BLUE CROSS/BLUE SHIELD | Admitting: Hematology and Oncology

## 2016-10-02 ENCOUNTER — Inpatient Hospital Stay: Payer: BLUE CROSS/BLUE SHIELD | Attending: Hematology and Oncology

## 2016-10-02 VITALS — BP 161/91 | HR 93 | Temp 95.2°F | Resp 18 | Wt 162.7 lb

## 2016-10-02 DIAGNOSIS — D890 Polyclonal hypergammaglobulinemia: Secondary | ICD-10-CM

## 2016-10-02 DIAGNOSIS — R748 Abnormal levels of other serum enzymes: Secondary | ICD-10-CM | POA: Diagnosis not present

## 2016-10-02 DIAGNOSIS — L930 Discoid lupus erythematosus: Secondary | ICD-10-CM

## 2016-10-02 DIAGNOSIS — D591 Other autoimmune hemolytic anemias: Principal | ICD-10-CM

## 2016-10-02 DIAGNOSIS — Z87891 Personal history of nicotine dependence: Secondary | ICD-10-CM | POA: Insufficient documentation

## 2016-10-02 DIAGNOSIS — E538 Deficiency of other specified B group vitamins: Secondary | ICD-10-CM

## 2016-10-02 DIAGNOSIS — Z79899 Other long term (current) drug therapy: Secondary | ICD-10-CM

## 2016-10-02 DIAGNOSIS — K2970 Gastritis, unspecified, without bleeding: Secondary | ICD-10-CM | POA: Diagnosis not present

## 2016-10-02 DIAGNOSIS — R635 Abnormal weight gain: Secondary | ICD-10-CM

## 2016-10-02 DIAGNOSIS — Z955 Presence of coronary angioplasty implant and graft: Secondary | ICD-10-CM | POA: Insufficient documentation

## 2016-10-02 DIAGNOSIS — Z7902 Long term (current) use of antithrombotics/antiplatelets: Secondary | ICD-10-CM | POA: Insufficient documentation

## 2016-10-02 DIAGNOSIS — I10 Essential (primary) hypertension: Secondary | ICD-10-CM | POA: Diagnosis not present

## 2016-10-02 DIAGNOSIS — E785 Hyperlipidemia, unspecified: Secondary | ICD-10-CM | POA: Insufficient documentation

## 2016-10-02 DIAGNOSIS — Z862 Personal history of diseases of the blood and blood-forming organs and certain disorders involving the immune mechanism: Secondary | ICD-10-CM

## 2016-10-02 DIAGNOSIS — D5912 Cold autoimmune hemolytic anemia: Secondary | ICD-10-CM

## 2016-10-02 DIAGNOSIS — J449 Chronic obstructive pulmonary disease, unspecified: Secondary | ICD-10-CM | POA: Diagnosis not present

## 2016-10-02 LAB — CBC WITH DIFFERENTIAL/PLATELET
Basophils Absolute: 0.1 10*3/uL (ref 0–0.1)
Basophils Relative: 1 %
Eosinophils Absolute: 0.1 10*3/uL (ref 0–0.7)
Eosinophils Relative: 1 %
HCT: 40.1 % (ref 35.0–47.0)
Hemoglobin: 14 g/dL (ref 12.0–16.0)
Lymphocytes Relative: 19 %
Lymphs Abs: 1.5 10*3/uL (ref 1.0–3.6)
MCH: 33 pg (ref 26.0–34.0)
MCHC: 35 g/dL (ref 32.0–36.0)
MCV: 94.3 fL (ref 80.0–100.0)
Monocytes Absolute: 0.6 10*3/uL (ref 0.2–0.9)
Monocytes Relative: 8 %
Neutro Abs: 5.7 10*3/uL (ref 1.4–6.5)
Neutrophils Relative %: 71 %
Platelets: 152 10*3/uL (ref 150–440)
RBC: 4.25 MIL/uL (ref 3.80–5.20)
RDW: 14.9 % — ABNORMAL HIGH (ref 11.5–14.5)
WBC: 8 10*3/uL (ref 3.6–11.0)

## 2016-10-02 LAB — COMPREHENSIVE METABOLIC PANEL
ALT: 15 U/L (ref 14–54)
AST: 14 U/L — ABNORMAL LOW (ref 15–41)
Albumin: 3.9 g/dL (ref 3.5–5.0)
Alkaline Phosphatase: 62 U/L (ref 38–126)
Anion gap: 7 (ref 5–15)
BUN: 28 mg/dL — ABNORMAL HIGH (ref 6–20)
CO2: 29 mmol/L (ref 22–32)
Calcium: 9.3 mg/dL (ref 8.9–10.3)
Chloride: 103 mmol/L (ref 101–111)
Creatinine, Ser: 1.05 mg/dL — ABNORMAL HIGH (ref 0.44–1.00)
GFR calc Af Amer: >60 mL/min (ref >60)
GFR calc non Af Amer: 57 mL/min — ABNORMAL LOW (ref >60)
Glucose, Bld: 94 mg/dL (ref 65–99)
Potassium: 3.6 mmol/L (ref 3.5–5.1)
Sodium: 139 mmol/L (ref 135–145)
Total Bilirubin: 0.6 mg/dL (ref 0.3–1.2)
Total Protein: 7.2 g/dL (ref 6.5–8.1)

## 2016-10-02 LAB — RETICULOCYTES
RBC.: 4.25 MIL/uL (ref 3.80–5.20)
Retic Count, Absolute: 187 10*3/uL — ABNORMAL HIGH (ref 19.0–183.0)
Retic Ct Pct: 4.4 % — ABNORMAL HIGH (ref 0.4–3.1)

## 2016-10-02 LAB — LACTATE DEHYDROGENASE: LDH: 216 U/L — ABNORMAL HIGH (ref 98–192)

## 2016-10-02 LAB — FOLATE: Folate: 41 ng/mL (ref >5.9)

## 2016-10-02 MED ORDER — SULFAMETHOXAZOLE-TRIMETHOPRIM 800-160 MG PO TABS: 1.0000 | ORAL_TABLET | ORAL | 0 refills | Status: DC

## 2016-10-02 NOTE — Progress Notes (Signed)
Patient recently had Lisinopril increased from 5 mg to 10 mg daily.  She has been taking Prednisone 20 mg daily since January 12th.

## 2016-10-02 NOTE — Progress Notes (Signed)
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  HPI:  The patient was last seen in the hematology clinic on 08/28/2016.  At that time, her hematocrit had improved to 39.7 with a hemoglobin of 13.7.  LDH was 178.  Bilirubin was 0.9.  Retic was 4.7%.  G6PD assay was negative.  Prednisone was decreased to 45 mg a day alternating with 40 mg a day.  Labs have been followed weekly.  Hemoglobin was 14 on 09/04/2016, 14.5 on 09/11/2016, and 14.5 on 09/25/2016.  Platelet count was 107,000 on 08/28/2016, 117,000 on 09/04/2016, 135,000 on 09/11/2016, and 146,000 on 09/25/2016.  LDH was 225 on 09/25/2016.  Prednisone was decreased from 30 mg a day alternating with 35 mg a day then to 20 mg a day on 09/11/2016.  Symptomatically, she notes some splotches on her face.  She denies any thrush.  She has gained 8 pounds.   Past Medical History:  Diagnosis Date  . Anemia   . COPD (chronic obstructive pulmonary disease) ([place])   . HLD (hyperlipidemia)   . Hypertension     Past Surgical History:  Procedure Laterality Date  . ESOPHAGOGASTRODUODENOSCOPY (EGD) WITH PROPOFOL N/A 07/08/2016   Procedure: ESOPHAGOGASTRODUODENOSCOPY (EGD) WITH PROPOFOL;  Surgeon: Manya Silvas, MD;  Location: [facility] ENDOSCOPY;  Service: Endoscopy;  Laterality: N/A;  . KNEE SURGERY    . PERIPHERAL VASCULAR CATHETERIZATION N/A 07/10/2016   Procedure: Lower Extremity Angiography;  Surgeon: Katha Cabal, MD;  Location: [facility];  Service: Cardiovascular;  Laterality: N/A;  . PERIPHERAL VASCULAR CATHETERIZATION N/A 07/10/2016   Procedure: Abdominal Aortogram w/Lower Extremity;  Surgeon: Katha Cabal, MD;  Location: [facility];  Service: Cardiovascular;  Laterality: N/A;  . PERIPHERAL VASCULAR CATHETERIZATION  07/10/2016   Procedure: Lower Extremity  Intervention;  Surgeon: Katha Cabal, MD;  Location: [facility];  Service: Cardiovascular;;    Family History  Problem Relation Age of Onset  . Diabetes Mother   . Hypertension Mother   . Diabetes Maternal Grandfather   . Hypertension Maternal Grandfather     Social History:  reports that she quit smoking about 2 months ago. Her smoking use included Cigarettes. She smoked 0.50 packs per day. She has never used smokeless tobacco. She reports that she does not drink alcohol or use drugs.  The patient works at [employer]  She is exposed to cold temperatures.  She has a daughter named, [name] and a daughter named [name].  She is accompanied by her daughter today.  Allergies:  Allergies  Allergen Reactions  . Codeine Anaphylaxis    Current Medications: Current Outpatient Prescriptions  Medication Sig Dispense Refill  . albuterol (PROVENTIL HFA;VENTOLIN HFA) 108 (90 Base) MCG/ACT inhaler Inhale into the lungs.    . clopidogrel (PLAVIX) 75 MG tablet Take 1 tablet (75 mg total) by mouth daily. 30 tablet 5  . docusate sodium (COLACE) 100 MG capsule Take 1 capsule (100 mg total) by mouth 2 (two) times daily. 10 capsule 0  . folic acid (FOLVITE) 1 MG tablet Take 1 tablet (1 mg total) by mouth daily. 90 tablet 1  . lisinopril (PRINIVIL,ZESTRIL) 10 MG tablet Take 10 mg by mouth daily.    [name] Kitchen omeprazole (PRILOSEC) 20 MG capsule Take 1 capsule (20 mg total) by mouth daily. 60 capsule 0  .  pravastatin (PRAVACHOL) 40 MG tablet Take 40 mg by mouth every evening.    . predniSONE (DELTASONE) 20 MG tablet Take 1 tablet by mouth daily 30 tablet 2  . traMADol (ULTRAM) 50 MG tablet Take 1 tablet (50 mg total) by mouth every 6 (six) hours as needed for moderate pain or severe pain. 30 tablet 0  . amoxicillin-clavulanate (AUGMENTIN) 875-125 MG tablet Take 1 tablet by mouth 2 (two) times daily.    . potassium chloride SA (K-DUR,KLOR-CON) 20 MEQ tablet Take by mouth.    . predniSONE  (DELTASONE) 5 MG tablet Take 13 tablets (65 mg total) by mouth daily with breakfast. (Patient not taking: Reported on 10/02/2016) 50 tablet 1  . predniSONE (DELTASONE) 5 MG tablet Take 1 tablet daily, patient also taking a dose of 60 mg for a total of 65 mg daily (Patient not taking: Reported on 10/02/2016) 30 tablet 0   No current facility-administered medications for this visit.     Review of Systems:  GENERAL: Energy level "ok". No fevers or sweats.  Weight gain of 8 pounds since last visit. PERFORMANCE STATUS (ECOG): 1 HEENT: No visual changes, sore throat, mouth sores or tenderness. Lungs: No shortness of breath or cough. No hemoptysis. Cardiac: No chest pain, palpitations, orthopnea, or PND. GI: Appetite good.  No nausea, vomiting, diarrhea, constipation, melena or hematochezia. GU: No urgency, frequency, dysuria, or hematuria. Musculoskeletal: No back pain. No joint pain. No muscle tenderness. Extremities: Left foot numbness resolved after stent placement. No pain or swelling. Skin: Discoid lupus. Splotchy rash on face. Neuro: No headache, weakness, balance or coordination issues. Endocrine: No diabetes, thyroid issues, hot flashes or night sweats. Psych: No mood changes, depression or anxiety. Pain: No focal pain. Review of systems: All other systems reviewed and found to be negative.  Physical Exam: Blood pressure (!) 164/99, pulse 93, temperature (!) 95.2 F (35.1 C), temperature source Tympanic, resp. rate 18, weight 162 lb 11.2 oz (73.8 kg). GENERAL:Well developed, well nourished, woman sitting comfortably in the exam room in no acute distress. MENTAL STATUS: Alert and oriented to person, place and time. HEAD:Long gray hair. Normocephalic, atraumatic, face full and symmetric.  Cushingoid features. EYES:Glasses.  Blue eyes.  Pupils equal round and reactive to light and accomodation. No conjunctivitis or scleral icterus. ENT:No thrush.  Oropharynx  clear without lesion. Tonguenormal.  Mucous membranes moist. RESPIRATORY:Clear to auscultationwithout rales, wheezes or rhonchi. CARDIOVASCULAR:Regular rate andrhythmwithout murmur, rub or gallop. ABDOMEN:Soft, non-tender, with active bowel sounds, and no hepatosplenomegaly. No masses. SKIN:  Macular slightly pigmented rash on cheeks. EXTREMITIES: No edema, no skin discoloration or tenderness. No palpable cords. LYMPHNODES: No palpable cervical, supraclavicular, axillary or inguinal adenopathy  NEUROLOGICAL: Unremarkable   Orders Only on 10/02/2016  Component Date Value Ref Range Status  . WBC 10/02/2016 8.0  3.6 - 11.0 K/uL Final  . RBC 10/02/2016 4.25  3.80 - 5.20 MIL/uL Final  . Hemoglobin 10/02/2016 14.0  12.0 - 16.0 g/dL Final  . HCT 10/02/2016 40.1  35.0 - 47.0 % Final  . MCV 10/02/2016 94.3  80.0 - 100.0 fL Final  . MCH 10/02/2016 33.0  26.0 - 34.0 pg Final  . MCHC 10/02/2016 35.0  32.0 - 36.0 g/dL Final  . RDW 10/02/2016 14.9* 11.5 - 14.5 % Final  . Platelets 10/02/2016 152  150 - 440 K/uL Final  . Neutrophils Relative % 10/02/2016 71  % Final  . Neutro Abs 10/02/2016 5.7  1.4 - 6.5 K/uL Final  . Lymphocytes Relative 10/02/2016  19  % Final  . Lymphs Abs 10/02/2016 1.5  1.0 - 3.6 K/uL Final  . Monocytes Relative 10/02/2016 8  % Final  . Monocytes Absolute 10/02/2016 0.6  0.2 - 0.9 K/uL Final  . Eosinophils Relative 10/02/2016 1  % Final  . Eosinophils Absolute 10/02/2016 0.1  0 - 0.7 K/uL Final  . Basophils Relative 10/02/2016 1  % Final  . Basophils Absolute 10/02/2016 0.1  0 - 0.1 K/uL Final  . Sodium 10/02/2016 139  135 - 145 mmol/L Final  . Potassium 10/02/2016 3.6  3.5 - 5.1 mmol/L Final  . Chloride 10/02/2016 103  101 - 111 mmol/L Final  . CO2 10/02/2016 29  22 - 32 mmol/L Final  . Glucose, Bld 10/02/2016 94  65 - 99 mg/dL Final  . BUN 10/02/2016 28* 6 - 20 mg/dL Final  . Creatinine, Ser 10/02/2016 1.05* 0.44 - 1.00 mg/dL Final  . Calcium 10/02/2016  9.3  8.9 - 10.3 mg/dL Final  . Total Protein 10/02/2016 7.2  6.5 - 8.1 g/dL Final  . Albumin 10/02/2016 3.9  3.5 - 5.0 g/dL Final  . AST 10/02/2016 14* 15 - 41 U/L Final  . ALT 10/02/2016 15  14 - 54 U/L Final  . Alkaline Phosphatase 10/02/2016 62  38 - 126 U/L Final  . Total Bilirubin 10/02/2016 0.6  0.3 - 1.2 mg/dL Final  . GFR calc non Af Amer 10/02/2016 57* >60 mL/min Final  . GFR calc Af Amer 10/02/2016 >60  >60 mL/min Final   Comment: (NOTE) The eGFR has been calculated using the CKD EPI equation. This calculation has not been validated in all clinical situations. eGFR's persistently <60 mL/min signify possible Chronic Kidney Disease.   . Anion gap 10/02/2016 7  5 - 15 Final  . LDH 10/02/2016 216* 98 - 192 U/L Final  . Retic Ct Pct 10/02/2016 4.4* 0.4 - 3.1 % Final  . RBC. 10/02/2016 4.25  3.80 - 5.20 MIL/uL Final  . 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 One week prior to presentation, she felt like she was coming down with something. She denied any fever, runny nose, sore throat or cough. She had some diarrhea. She denied any new medications or herbal products.  Anemia workup revealed revealed a cold autoantibody (IgG and complement).  Reticulocyte count was 11.3%.  Ferritin was 562.  Iron studies revealed a saturation of 20% and a TIBC of 214 (low).  B12 was 231 (low normal).  Folate was 42.   Peripheral smear revealed rouleaux formation.  Additional testing included the following + studies:  hepatitis C antibody, hepatitis B core antibody, CMV IgM, and EBV VCA (IgM and IgG).  Hepatitis B by PCR was negative.  Hepatitis C RNA was negative.  Mycoplasma pneumonia IgM was negative.  Reticulocyte count was 11.3% (high) indicating appropriate marrow response.  C3 and C4 were normal.  Cold agglutinin  titer was negative x 2.  Negative studies included:  ANA, hepatitis B surface antigen, SPEP, and free light chain ratio.   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  MMA was initially 330 (normal).  Repeat MMA was elevated on 08/01/2016 confirming B12 deficiency.  Hepatitis B surface antibody was positive and hepatitis B surface antigen was negative on 08/06/2016.  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 There was no adenopathy. Spleen was normal.  She underwent PTCA and stent placement in right and left common iliac arteries and left external iliac artery on 07/10/2016.  She is on Plavix.  EGD on 03/19/2015 revealed gastritis in the body and antrum. Colonoscopy on 03/19/2015 revealed one hyperplastic polyp. EGD on 07/08/2016 was normal.  No evidence of bleeding.  She has received 6 units of warmed PRBCs to date (last on 08/01/2016). If Rituxan is needed, she requires entecavir 0.5 mg q day beginning 2 weeks prior to Rituxan.  In addition, hepatitis B viral level and LFTs should be checked monthly.  She began steroids (1 mg/kg) on 08/01/2016.  She is currently on prednisone 20 mg a day.  She is on folic acid 1 mg a day.  She received B12 1000 mcg IM on 08/06/2016.  She is on oral B12.  Symptomatically, she has gained weight.  Exam reveals facial fullness. She has a faint splotchy macular rash on her cheeks.  Hematocrit and platelet count are normal.  Reticulocyte count is elevated.  Plan: 1.  Labs today:  CBC with diff, CMP, LDH, retic, folate. 2.  Decrease prednisone to 10 mg a day. 3.  Discuss plan for weekly CBC and ongoing prednisone taper.  Nurse to call weekly.  If patient has not heard from nurse, she is to call for counts and adjustment in prednisone dosing. 4.  Discuss  plan for prophylactic Septra every Monday, Wednesday and Friday. 5.  Discuss plan to institute Rituxan with antiviral prophylaxis if Rituxan instituted. 6.  Discuss blood pressure.  Recheck. 7.  Rx:  Septra DS 1 tablet po 3 times/week. 8.  RTC weekly x 4 for CBC and ongoing prednisone taper. 9.  RTC in 4 weeks for MD assessment and labs (CBC with diff, CMP, LDH, retic).   Lequita Asal, MD  10/02/2016, 1:56 PM

## 2016-10-04 ENCOUNTER — Encounter: Payer: Self-pay | Admitting: Hematology and Oncology

## 2016-10-04 DIAGNOSIS — D696 Thrombocytopenia, unspecified: Secondary | ICD-10-CM | POA: Insufficient documentation

## 2016-10-04 HISTORY — DX: Thrombocytopenia, unspecified: D69.6

## 2016-10-09 ENCOUNTER — Inpatient Hospital Stay: Payer: BLUE CROSS/BLUE SHIELD

## 2016-10-09 ENCOUNTER — Other Ambulatory Visit: Payer: BLUE CROSS/BLUE SHIELD

## 2016-10-09 DIAGNOSIS — D5912 Cold autoimmune hemolytic anemia: Secondary | ICD-10-CM

## 2016-10-09 DIAGNOSIS — D591 Other autoimmune hemolytic anemias: Secondary | ICD-10-CM | POA: Diagnosis not present

## 2016-10-09 DIAGNOSIS — E538 Deficiency of other specified B group vitamins: Secondary | ICD-10-CM

## 2016-10-09 LAB — CBC WITH DIFFERENTIAL/PLATELET
Basophils Absolute: 0.1 10*3/uL (ref 0–0.1)
Basophils Relative: 1 %
Eosinophils Absolute: 0.1 10*3/uL (ref 0–0.7)
Eosinophils Relative: 1 %
HCT: 37.9 % (ref 35.0–47.0)
Hemoglobin: 13.3 g/dL (ref 12.0–16.0)
Lymphocytes Relative: 10 %
Lymphs Abs: 0.8 10*3/uL — ABNORMAL LOW (ref 1.0–3.6)
MCH: 32.9 pg (ref 26.0–34.0)
MCHC: 35 g/dL (ref 32.0–36.0)
MCV: 93.8 fL (ref 80.0–100.0)
Monocytes Absolute: 0.7 10*3/uL (ref 0.2–0.9)
Monocytes Relative: 9 %
Neutro Abs: 5.9 10*3/uL (ref 1.4–6.5)
Neutrophils Relative %: 79 %
Platelets: 167 10*3/uL (ref 150–440)
RBC: 4.05 MIL/uL (ref 3.80–5.20)
RDW: 14.8 % — ABNORMAL HIGH (ref 11.5–14.5)
WBC: 7.5 10*3/uL (ref 3.6–11.0)

## 2016-10-09 LAB — VITAMIN B12: Vitamin B-12: 634 pg/mL (ref 180–914)

## 2016-10-14 ENCOUNTER — Ambulatory Visit (INDEPENDENT_AMBULATORY_CARE_PROVIDER_SITE_OTHER): Payer: BLUE CROSS/BLUE SHIELD

## 2016-10-14 ENCOUNTER — Ambulatory Visit (INDEPENDENT_AMBULATORY_CARE_PROVIDER_SITE_OTHER): Payer: BLUE CROSS/BLUE SHIELD | Admitting: Vascular Surgery

## 2016-10-14 DIAGNOSIS — I70213 Atherosclerosis of native arteries of extremities with intermittent claudication, bilateral legs: Secondary | ICD-10-CM | POA: Diagnosis not present

## 2016-10-16 ENCOUNTER — Inpatient Hospital Stay: Payer: BLUE CROSS/BLUE SHIELD

## 2016-10-16 ENCOUNTER — Other Ambulatory Visit: Payer: BLUE CROSS/BLUE SHIELD

## 2016-10-16 DIAGNOSIS — E538 Deficiency of other specified B group vitamins: Secondary | ICD-10-CM

## 2016-10-16 DIAGNOSIS — D591 Other autoimmune hemolytic anemias: Secondary | ICD-10-CM | POA: Diagnosis not present

## 2016-10-16 DIAGNOSIS — D5912 Cold autoimmune hemolytic anemia: Secondary | ICD-10-CM

## 2016-10-16 LAB — CBC WITH DIFFERENTIAL/PLATELET
Basophils Absolute: 0.1 10*3/uL (ref 0–0.1)
Basophils Relative: 1 %
Eosinophils Absolute: 0.2 10*3/uL (ref 0–0.7)
Eosinophils Relative: 3 %
HCT: 37.6 % (ref 35.0–47.0)
Hemoglobin: 13 g/dL (ref 12.0–16.0)
Lymphocytes Relative: 20 %
Lymphs Abs: 1.2 10*3/uL (ref 1.0–3.6)
MCH: 32.2 pg (ref 26.0–34.0)
MCHC: 34.5 g/dL (ref 32.0–36.0)
MCV: 93.3 fL (ref 80.0–100.0)
Monocytes Absolute: 0.5 10*3/uL (ref 0.2–0.9)
Monocytes Relative: 9 %
Neutro Abs: 4.1 10*3/uL (ref 1.4–6.5)
Neutrophils Relative %: 67 %
Platelets: 176 10*3/uL (ref 150–440)
RBC: 4.03 MIL/uL (ref 3.80–5.20)
RDW: 14.1 % (ref 11.5–14.5)
WBC: 6 10*3/uL (ref 3.6–11.0)

## 2016-10-19 ENCOUNTER — Inpatient Hospital Stay (HOSPITAL_BASED_OUTPATIENT_CLINIC_OR_DEPARTMENT_OTHER): Payer: BLUE CROSS/BLUE SHIELD | Admitting: Hematology and Oncology

## 2016-10-19 ENCOUNTER — Inpatient Hospital Stay: Payer: BLUE CROSS/BLUE SHIELD

## 2016-10-19 VITALS — BP 141/83 | HR 79 | Temp 96.3°F | Resp 18 | Wt 159.4 lb

## 2016-10-19 DIAGNOSIS — D591 Other autoimmune hemolytic anemias: Secondary | ICD-10-CM | POA: Diagnosis not present

## 2016-10-19 DIAGNOSIS — Z79899 Other long term (current) drug therapy: Secondary | ICD-10-CM

## 2016-10-19 DIAGNOSIS — D5912 Cold autoimmune hemolytic anemia: Secondary | ICD-10-CM

## 2016-10-19 DIAGNOSIS — L930 Discoid lupus erythematosus: Secondary | ICD-10-CM

## 2016-10-19 DIAGNOSIS — D890 Polyclonal hypergammaglobulinemia: Secondary | ICD-10-CM

## 2016-10-19 DIAGNOSIS — R238 Other skin changes: Secondary | ICD-10-CM

## 2016-10-19 DIAGNOSIS — R748 Abnormal levels of other serum enzymes: Secondary | ICD-10-CM

## 2016-10-19 LAB — CBC WITH DIFFERENTIAL/PLATELET
Basophils Absolute: 0.1 10*3/uL (ref 0–0.1)
Basophils Relative: 1 %
Eosinophils Absolute: 0.1 10*3/uL (ref 0–0.7)
Eosinophils Relative: 3 %
HCT: 37.1 % (ref 35.0–47.0)
Hemoglobin: 13 g/dL (ref 12.0–16.0)
Lymphocytes Relative: 26 %
Lymphs Abs: 1.4 10*3/uL (ref 1.0–3.6)
MCH: 32.3 pg (ref 26.0–34.0)
MCHC: 35.1 g/dL (ref 32.0–36.0)
MCV: 92 fL (ref 80.0–100.0)
Monocytes Absolute: 0.5 10*3/uL (ref 0.2–0.9)
Monocytes Relative: 9 %
Neutro Abs: 3.2 10*3/uL (ref 1.4–6.5)
Neutrophils Relative %: 61 %
Platelets: 197 10*3/uL (ref 150–440)
RBC: 4.04 MIL/uL (ref 3.80–5.20)
RDW: 14.5 % (ref 11.5–14.5)
WBC: 5.3 10*3/uL (ref 3.6–11.0)

## 2016-10-19 LAB — COMPREHENSIVE METABOLIC PANEL
ALT: 52 U/L (ref 14–54)
AST: 22 U/L (ref 15–41)
Albumin: 4.1 g/dL (ref 3.5–5.0)
Alkaline Phosphatase: 216 U/L — ABNORMAL HIGH (ref 38–126)
Anion gap: 10 (ref 5–15)
BUN: 26 mg/dL — ABNORMAL HIGH (ref 6–20)
CO2: 27 mmol/L (ref 22–32)
Calcium: 9.4 mg/dL (ref 8.9–10.3)
Chloride: 103 mmol/L (ref 101–111)
Creatinine, Ser: 1.11 mg/dL — ABNORMAL HIGH (ref 0.44–1.00)
GFR calc Af Amer: >60 mL/min (ref >60)
GFR calc non Af Amer: 54 mL/min — ABNORMAL LOW (ref >60)
Glucose, Bld: 91 mg/dL (ref 65–99)
Potassium: 3.9 mmol/L (ref 3.5–5.1)
Sodium: 140 mmol/L (ref 135–145)
Total Bilirubin: 0.7 mg/dL (ref 0.3–1.2)
Total Protein: 7.7 g/dL (ref 6.5–8.1)

## 2016-10-19 NOTE — Progress Notes (Signed)
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  HPI:  The patient was last seen in the hematology clinic on 10/02/2016.  At that time, she was doing well.  Hematocrit was 40.1, hemoglobin 14.0, and platelets 152,000.  Prednsione was decreased to 10 mg a day.  She was started on prophylactic Septra secondary to her prolonged course of steroids.  CBC on 10/16/2016 revealed a hematocrit of 37.6, hemoglobin of 13.0, and platelets 176,000.  During the interim, she developed an outbreak of vesicles covering her lips, nose and nostrils that started about 2 weeks ago but has progressively worsened this past weekend. She admits to a fever as high as 102 one week ago when she was seen by her PCP on 10/12/2016. Fever has resolved.   Her last dose of Septra was Friday 10/16/2016. She states both of her sisters are allergic to Septra, and she is afraid "the sores" is an allergic reaction to Septra. She did not take Septra this morning.   This weekend the sores got so bad she has been unable to put her dentures in her mouth. She is unable to chew due to the pain with the act of chewing. She states they crack and bleed. She is able to drink through a straw. She has never had anything like this before. She has had an occasional "fever blister" but has never had this before. She has tried Herpicin C and Carmex without any relief.    Past Medical History:  Diagnosis Date  . Anemia   . COPD (chronic obstructive pulmonary disease) ([place])   . HLD (hyperlipidemia)   . Hypertension     Past Surgical History:  Procedure Laterality Date  . ESOPHAGOGASTRODUODENOSCOPY (EGD) WITH PROPOFOL N/A 07/08/2016   Procedure: ESOPHAGOGASTRODUODENOSCOPY (EGD) WITH PROPOFOL;  Surgeon: Manya Silvas, MD;  Location: [facility] ENDOSCOPY;  Service: Endoscopy;  Laterality: N/A;  . KNEE SURGERY     . PERIPHERAL VASCULAR CATHETERIZATION N/A 07/10/2016   Procedure: Lower Extremity Angiography;  Surgeon: Katha Cabal, MD;  Location: [place] [facility];  Service: Cardiovascular;  Laterality: N/A;  . PERIPHERAL VASCULAR CATHETERIZATION N/A 07/10/2016   Procedure: Abdominal Aortogram w/Lower Extremity;  Surgeon: Katha Cabal, MD;  Location: [place] [facility];  Service: Cardiovascular;  Laterality: N/A;  . PERIPHERAL VASCULAR CATHETERIZATION  07/10/2016   Procedure: Lower Extremity Intervention;  Surgeon: Katha Cabal, MD;  Location: [place] [facility];  Service: Cardiovascular;;    Family History  Problem Relation Age of Onset  . Diabetes Mother   . Hypertension Mother   . Diabetes Maternal Grandfather   . Hypertension Maternal Grandfather     Social History:  reports that she quit smoking about 3 months ago. Her smoking use included Cigarettes. She smoked 0.50 packs per day. She has never used smokeless tobacco. She reports that she does not drink alcohol or use drugs.  The patient works at [employer]  She is exposed to cold temperatures.  She has a daughter named, [name] and a daughter named [name].  She is alone today.   Allergies:  Allergies  Allergen Reactions  . Codeine Anaphylaxis    Current Medications: Current Outpatient Prescriptions  Medication Sig Dispense Refill  . albuterol (PROVENTIL HFA;VENTOLIN HFA) 108 (90 Base) MCG/ACT inhaler Inhale into the lungs.    [name] Kitchen amoxicillin-clavulanate (AUGMENTIN) 875-125 MG  tablet Take 1 tablet by mouth 2 (two) times daily.    . clopidogrel (PLAVIX) 75 MG tablet Take 1 tablet (75 mg total) by mouth daily. 30 tablet 5  . docusate sodium (COLACE) 100 MG capsule Take 1 capsule (100 mg total) by mouth 2 (two) times daily. 10 capsule 0  . folic acid (FOLVITE) 1 MG tablet Take 1 tablet (1 mg total) by mouth daily. 90 tablet 1  . lisinopril (PRINIVIL,ZESTRIL) 10 MG tablet Take 10 mg by mouth daily.    [name] Kitchen  omeprazole (PRILOSEC) 20 MG capsule Take 1 capsule (20 mg total) by mouth daily. 60 capsule 0  . pravastatin (PRAVACHOL) 40 MG tablet Take 40 mg by mouth every evening.    . predniSONE (DELTASONE) 20 MG tablet Take 1 tablet by mouth daily (Patient taking differently: 10 mg. Take 1 tablet by mouth daily) 30 tablet 2  . traMADol (ULTRAM) 50 MG tablet Take 1 tablet (50 mg total) by mouth every 6 (six) hours as needed for moderate pain or severe pain. 30 tablet 0  . potassium chloride SA (K-DUR,KLOR-CON) 20 MEQ tablet Take by mouth.    . predniSONE (DELTASONE) 5 MG tablet Take 13 tablets (65 mg total) by mouth daily with breakfast. (Patient not taking: Reported on 10/02/2016) 50 tablet 1  . predniSONE (DELTASONE) 5 MG tablet Take 1 tablet daily, patient also taking a dose of 60 mg for a total of 65 mg daily (Patient not taking: Reported on 10/02/2016) 30 tablet 0  . sulfamethoxazole-trimethoprim (BACTRIM DS,SEPTRA DS) 800-160 MG tablet Take 1 tablet by mouth 3 (three) times a week. (Patient not taking: Reported on 10/19/2016) 30 tablet 0   No current facility-administered medications for this visit.     Review of Systems:  GENERAL: Energy level "ok". No fevers or sweats.  Weight loss of 3 pounds since last visit. PERFORMANCE STATUS (ECOG): 1 HEENT: Numerous mouth/nasal sores. No visual changes, sore throat, mouth sores or tenderness. Lungs: No shortness of breath or cough. No hemoptysis. Cardiac: No chest pain, palpitations, orthopnea, or PND. GI: Appetite poor due to the inability to chew because of mouth pain.  No nausea, vomiting, diarrhea, constipation, melena or hematochezia. GU: No urgency, frequency, dysuria, or hematuria. Musculoskeletal: No back pain. No joint pain. No muscle tenderness. Extremities: Left foot numbness resolved after stent placement. No pain or swelling. Skin: Discoid lupus. Numerous vesicles on lips, nose and up nostrils. Crusted over. Few are open.  Neuro: No  headache, weakness, balance or coordination issues. Endocrine: No diabetes, thyroid issues, hot flashes or night sweats. Psych: No mood changes, depression or anxiety. Pain:  Pain in mouth. Unable to wear dentures. Review of systems: All other systems reviewed and found to be negative.  Physical Exam: Blood pressure (!) 141/83, pulse 79, temperature (!) 96.3 F (35.7 C), temperature source Tympanic, resp. rate 18, weight 159 lb 6.3 oz (72.3 kg). GENERAL:Well developed, well nourished, woman sitting comfortably in the exam room in no acute distress. MENTAL STATUS: Alert and oriented to person, place and time. HEAD:Long gray hair. Normocephalic, atraumatic, face full and symmetric.  Cushingoid features. EYES:Glasses.  Blue eyes.  Pupils equal round and reactive to light and accomodation. No conjunctivitis or scleral icterus. GHW:EXHBZJIR around lips and extending into nares.  Golden colored crusted lesions with some thick brown crusting.  Right external nares with confluent white lesion with erythematous base. Lesions extend periorally limiting opening of mouth.  No thrush.  Oropharynx clear without lesion. Tonguenormal.  Mucous membranes  moist. RESPIRATORY:Clear to auscultationwithout rales, wheezes or rhonchi. CARDIOVASCULAR:Regular rate andrhythmwithout murmur, rub or gallop. ABDOMEN:Soft, non-tender, with active bowel sounds, and no hepatosplenomegaly. No masses. SKIN:  Macular slightly pigmented rash on cheeks. EXTREMITIES: No edema, no skin discoloration or tenderness. No palpable cords. LYMPHNODES: No palpable cervical, supraclavicular, axillary or inguinal adenopathy  NEUROLOGICAL: Unremarkable   Appointment on 10/19/2016  Component Date Value Ref Range Status  . WBC 10/19/2016 5.3  3.6 - 11.0 K/uL Final  . RBC 10/19/2016 4.04  3.80 - 5.20 MIL/uL Final  . Hemoglobin 10/19/2016 13.0  12.0 - 16.0 g/dL Final  . HCT 10/19/2016 37.1  35.0 - 47.0 % Final  .  MCV 10/19/2016 92.0  80.0 - 100.0 fL Final  . MCH 10/19/2016 32.3  26.0 - 34.0 pg Final  . MCHC 10/19/2016 35.1  32.0 - 36.0 g/dL Final  . RDW 10/19/2016 14.5  11.5 - 14.5 % Final  . Platelets 10/19/2016 197  150 - 440 K/uL Final  . Neutrophils Relative % 10/19/2016 61  % Final  . Neutro Abs 10/19/2016 3.2  1.4 - 6.5 K/uL Final  . Lymphocytes Relative 10/19/2016 26  % Final  . Lymphs Abs 10/19/2016 1.4  1.0 - 3.6 K/uL Final  . Monocytes Relative 10/19/2016 9  % Final  . Monocytes Absolute 10/19/2016 0.5  0.2 - 0.9 K/uL Final  . Eosinophils Relative 10/19/2016 3  % Final  . Eosinophils Absolute 10/19/2016 0.1  0 - 0.7 K/uL Final  . Basophils Relative 10/19/2016 1  % Final  . Basophils Absolute 10/19/2016 0.1  0 - 0.1 K/uL Final  . Sodium 10/19/2016 140  135 - 145 mmol/L Final  . Potassium 10/19/2016 3.9  3.5 - 5.1 mmol/L Final  . Chloride 10/19/2016 103  101 - 111 mmol/L Final  . CO2 10/19/2016 27  22 - 32 mmol/L Final  . Glucose, Bld 10/19/2016 91  65 - 99 mg/dL Final  . BUN 10/19/2016 26* 6 - 20 mg/dL Final  . Creatinine, Ser 10/19/2016 1.11* 0.44 - 1.00 mg/dL Final  . Calcium 10/19/2016 9.4  8.9 - 10.3 mg/dL Final  . Total Protein 10/19/2016 7.7  6.5 - 8.1 g/dL Final  . Albumin 10/19/2016 4.1  3.5 - 5.0 g/dL Final  . AST 10/19/2016 22  15 - 41 U/L Final  . ALT 10/19/2016 52  14 - 54 U/L Final  . Alkaline Phosphatase 10/19/2016 216* 38 - 126 U/L Final  . Total Bilirubin 10/19/2016 0.7  0.3 - 1.2 mg/dL Final  . GFR calc non Af Amer 10/19/2016 54* >60 mL/min Final  . GFR calc Af Amer 10/19/2016 >60  >60 mL/min Final   Comment: (NOTE) The eGFR has been calculated using the CKD EPI equation. This calculation has not been validated in all clinical situations. eGFR's persistently <60 mL/min signify possible Chronic Kidney Disease.   . 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 One week prior to presentation, she felt like she was coming down with something. She denied any fever, runny nose, sore throat or cough. She had some diarrhea. She denied any new medications or herbal products.  Anemia workup revealed revealed a cold autoantibody (IgG and complement).  Reticulocyte count was 11.3%.  Ferritin was 562.  Iron studies revealed a saturation of 20% and a TIBC of  214 (low).  B12 was 231 (low normal).  Folate was 42.   Peripheral smear revealed rouleaux formation.  Additional testing included the following + studies:  hepatitis C antibody, hepatitis B core antibody, CMV IgM, and EBV VCA (IgM and IgG).  Hepatitis B by PCR was negative.  Hepatitis C RNA was negative.  Mycoplasma pneumonia IgM was negative.  Reticulocyte count was 11.3% (high) indicating appropriate marrow response.  C3 and C4 were normal.  Cold agglutinin titer was negative x 2.  Negative studies included:  ANA, hepatitis B surface antigen, SPEP, and free light chain ratio.   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  MMA was initially 330 (normal).  Repeat MMA was elevated on 08/01/2016 confirming B12 deficiency.  Hepatitis B surface antibody was positive and hepatitis B surface antigen was negative on 08/06/2016.  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 There was no adenopathy. Spleen was normal.  She underwent PTCA and stent placement in right and left common iliac arteries and left external iliac artery on 07/10/2016.  She is on Plavix.  EGD on 03/19/2015 revealed gastritis in the body and antrum. Colonoscopy on 03/19/2015 revealed one hyperplastic polyp. EGD on 07/08/2016 was normal.  No evidence of bleeding.  She has received  6 units of warmed PRBCs to date (last on 08/01/2016). If Rituxan is needed, she requires entecavir 0.5 mg q day beginning 2 weeks prior to Rituxan.  In addition, hepatitis B viral level and LFTs should be checked monthly.  She began steroids (1 mg/kg) on 08/01/2016.  She is currently on prednisone 10 mg a day.  She is on folic acid 1 mg a day.  She received B12 1000 mcg IM on 08/06/2016.  She is on oral B12.  Symptomatically, she a week history of perioral vesicles which extend into her nose.  Episode was preceded by a fever.   Plan: 1.  Labs today:  CBC with diff, CMP 2.  Discuss concern for herpes simplex outbreak given ongoing immunosuppression with with steroids. 3.  Discontinue Septra (doubt reaction).  Anticipate initiation of valacyclovir. 4.  Phone consult with Dr. Adrian Prows, infectious disease.  Patient will be seen this morning. 5.  RTC in 1 week for MD assessment and labs (CBC with diff, BMP)  . Addendum:  Alkaline phosphatase was markedly elevated.  A fractionated alkaline phosphatase was drawn.  LFTs will be added to next blood draw.  Her prednisone will be tapered to 5 mg a day.  The patient was seen and examined.  The assessment and plan was discussed with the patient.  Multiple questions were asked and answered.  More than 30 minutes were spent face to face with patient and coordinating her care.   Faythe Casa, NP  Lequita Asal, MD  10/19/2016, 1:40 PM

## 2016-10-19 NOTE — Progress Notes (Signed)
Patient here today as acute add on.  Patient walked in this morning because she thinks she is having a reaction to Bactrim.  She has sores around her mouth, in her mouth and up in her nose. States she cannot get her mouth open to put her dentures in.

## 2016-10-20 ENCOUNTER — Telehealth: Payer: Self-pay | Admitting: *Deleted

## 2016-10-20 LAB — HSV(HERPES SIMPLEX VRS) I + II AB-IGG
HSV 1 Glycoprotein G Ab, IgG: 56.8 index — ABNORMAL HIGH (ref 0.00–0.90)
HSV 2 Glycoprotein G Ab, IgG: <0.91 index (ref 0.00–0.90)

## 2016-10-20 LAB — ALKALINE PHOSPHATASE, ISOENZYMES
Alk Phos Bone Fract: 39 % (ref 14–68)
Alk Phos Liver Fract: 61 % (ref 18–85)
Alk Phos: 256 IU/L — ABNORMAL HIGH (ref 39–117)
Intestinal %: 0 % (ref 0–18)

## 2016-10-20 NOTE — Telephone Encounter (Signed)
Called patient to inquire the dosage of prednisone she is taking daily.  Patient states she is taking 10 mg.  Advised her to continue on 10 mg, per MD.  Verbalized understanding.

## 2016-10-20 NOTE — Telephone Encounter (Signed)
-----   Message from Lequita Asal, MD sent at 10/20/2016  3:21 PM EST ----- Regarding: Please call patient  I believe she is on prednisone 20 mg a day.  If so, decrease to 10 mg a day.  M

## 2016-10-23 ENCOUNTER — Inpatient Hospital Stay: Payer: BLUE CROSS/BLUE SHIELD

## 2016-10-23 ENCOUNTER — Other Ambulatory Visit: Payer: BLUE CROSS/BLUE SHIELD

## 2016-10-23 DIAGNOSIS — L0100 Impetigo, unspecified: Secondary | ICD-10-CM | POA: Insufficient documentation

## 2016-10-23 DIAGNOSIS — Z7952 Long term (current) use of systemic steroids: Secondary | ICD-10-CM | POA: Insufficient documentation

## 2016-10-25 ENCOUNTER — Encounter: Payer: Self-pay | Admitting: Hematology and Oncology

## 2016-10-25 DIAGNOSIS — R748 Abnormal levels of other serum enzymes: Secondary | ICD-10-CM | POA: Insufficient documentation

## 2016-10-25 DIAGNOSIS — B002 Herpesviral gingivostomatitis and pharyngotonsillitis: Secondary | ICD-10-CM | POA: Insufficient documentation

## 2016-10-25 NOTE — Progress Notes (Signed)
[facility] day:  10/26/2016  Chief Complaint: Tina Page is a 61 y.o. 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  HPI:  The patient was last seen in the hematology clinic on 10/19/2016.  At that time, she was seen for a sick call visit.  She had perioral vesicles which extended into her nares worrisome for a herpetic outbreak.  She was referred to Dr. Ola Spurr of infectious disease for assessment.  Labs included a hematocrit of 37.1, hemoglobin 13.0 and platelet count of 197,000.  Her prednisone was to have been decreased to 5 mg a day.  She has remained at 10 mg a day.  Creatinine was normal.  Alkaline phosphatase has increased from 62 to 216.  Fractionated alkaline phosphatase revealed a total of 256 (39% bone and 61% liver).  HSV-1 antibody was positive.  HSV-2 antibody was negative.   She was seen by Dr. Ola Spurr on 10/19/2016 and 10/23/2016.  She was started on doxycycline 100 mg po BID and valacyclovir 1000 mg po TID for coverage of impetigo and herpes.  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  During the interim, she is feeling better. Sores around mouth have started to crust over. She is able to eat but continues to drink fluids through a straw. She has been taking doxycycline and valacyclovir and bactrim without issue. She has had no more fevers. Since taking the doxycycline and valacycolvir, she has not developed any additional sores.   Lesions are crusted.  She is to follow-up with Dr. Ola Spurr on Friday, 10/30/2016.    Past Medical History:  Diagnosis Date  . Anemia   . COPD (chronic obstructive pulmonary disease) ([place])   . HLD (hyperlipidemia)   . Hypertension     Past Surgical History:  Procedure Laterality Date  . ESOPHAGOGASTRODUODENOSCOPY (EGD) WITH PROPOFOL N/A 07/08/2016   Procedure: ESOPHAGOGASTRODUODENOSCOPY (EGD) WITH PROPOFOL;   Surgeon: Manya Silvas, MD;  Location: [facility] ENDOSCOPY;  Service: Endoscopy;  Laterality: N/A;  . KNEE SURGERY    . PERIPHERAL VASCULAR CATHETERIZATION N/A 07/10/2016   Procedure: Lower Extremity Angiography;  Surgeon: Katha Cabal, MD;  Location: [place] [facility];  Service: Cardiovascular;  Laterality: N/A;  . PERIPHERAL VASCULAR CATHETERIZATION N/A 07/10/2016   Procedure: Abdominal Aortogram w/Lower Extremity;  Surgeon: Katha Cabal, MD;  Location: [place] [facility];  Service: Cardiovascular;  Laterality: N/A;  . PERIPHERAL VASCULAR CATHETERIZATION  07/10/2016   Procedure: Lower Extremity Intervention;  Surgeon: Katha Cabal, MD;  Location: [place] [facility] LAB;  Service: Cardiovascular;;    Family History  Problem Relation Age of Onset  . Diabetes Mother   . Hypertension Mother   . Diabetes Maternal Grandfather   . Hypertension Maternal Grandfather     Social History:  reports that she quit smoking about 3 months ago. Her smoking use included Cigarettes. She smoked 0.50 packs per day. She has never used smokeless tobacco. She reports that she does not drink alcohol or use drugs.  The patient works at [employer]  She is exposed to cold temperatures.  She has a daughter, [name] and a daughter named [name].  She is alone today.   Allergies:  Allergies  Allergen Reactions  . Codeine Anaphylaxis    Current Medications: Current Outpatient Prescriptions  Medication Sig Dispense Refill  . albuterol (PROVENTIL HFA;VENTOLIN HFA) 108 (90 Base) MCG/ACT inhaler Inhale into the  lungs.    . clopidogrel (PLAVIX) 75 MG tablet Take 1 tablet (75 mg total) by mouth daily. 30 tablet 5  . docusate sodium (COLACE) 100 MG capsule Take 1 capsule (100 mg total) by mouth 2 (two) times daily. 10 capsule 0  . doxycycline (VIBRAMYCIN) 100 MG capsule Take by mouth.    . folic acid (FOLVITE) 1 MG tablet Take 1 tablet (1 mg total) by mouth daily. 90 tablet 1  . lisinopril  (PRINIVIL,ZESTRIL) 10 MG tablet Take 10 mg by mouth daily.    . mupirocin ointment (BACTROBAN) 2 % Apply topically.    [name] Kitchen omeprazole (PRILOSEC) 20 MG capsule Take 1 capsule (20 mg total) by mouth daily. 60 capsule 0  . pravastatin (PRAVACHOL) 40 MG tablet Take 40 mg by mouth every evening.    . predniSONE (DELTASONE) 20 MG tablet Take 1 tablet by mouth daily (Patient taking differently: 10 mg. Take 1 tablet by mouth daily) 30 tablet 2  . traMADol (ULTRAM) 50 MG tablet Take 1 tablet (50 mg total) by mouth every 6 (six) hours as needed for moderate pain or severe pain. 30 tablet 0  . valACYclovir (VALTREX) 1000 MG tablet Take by mouth.    . predniSONE (DELTASONE) 5 MG tablet Take 13 tablets (65 mg total) by mouth daily with breakfast. (Patient not taking: Reported on 10/02/2016) 50 tablet 1  . predniSONE (DELTASONE) 5 MG tablet Take 1 tablet daily, patient also taking a dose of 60 mg for a total of 65 mg daily (Patient not taking: Reported on 10/02/2016) 30 tablet 0  . sulfamethoxazole-trimethoprim (BACTRIM DS,SEPTRA DS) 800-160 MG tablet Take 1 tablet by mouth 3 (three) times a week. (Patient not taking: Reported on 10/19/2016) 30 tablet 0   No current facility-administered medications for this visit.     Review of Systems:  GENERAL: Energy level "ok". No fevers or sweats.  Weight gain of 3 pounds since last visit. PERFORMANCE STATUS (ECOG): 1 HEENT: Numerous scabbed mouth/nasal sores. No visual changes, sore throat, mouth sores or tenderness. Lungs: No shortness of breath or cough. No hemoptysis. Cardiac: No chest pain, palpitations, orthopnea, or PND. GI: Appetite improved.  Can eat without pain.  No nausea, vomiting, diarrhea, constipation, melena or hematochezia. GU: No urgency, frequency, dysuria, or hematuria. Musculoskeletal: No back pain. No joint pain. No muscle tenderness. Extremities: Left foot numbness resolved after stent placement. No pain or swelling. Skin: Discoid lupus.  Numerous vesicles on lips, nose and up nostrils. Herpetic lesions crusted over. Neuro: No headache, weakness, balance or coordination issues. Endocrine: No diabetes, thyroid issues, hot flashes or night sweats. Psych: No mood changes, depression or anxiety. Pain:  Pain in mouth. Unable to wear dentures. Review of systems: All other systems reviewed and found to be negative.  Physical Exam: Blood pressure 123/73, pulse 79, temperature 98.4 F (36.9 C), temperature source Tympanic, resp. rate 18, weight 162 lb 13 oz (73.9 kg). GENERAL:Well developed, well nourished, woman sitting comfortably in the exam room in no acute distress. MENTAL STATUS: Alert and oriented to person, place and time. HEAD:Long gray hair. Normocephalic, atraumatic, face full and symmetric.  Cushingoid features. EYES:Glasses.  Blue eyes.  Pupils equal round and reactive to light and accomodation. No conjunctivitis or scleral icterus. YFV:CBSWHQ eschar/crusted lesions around lips and extending into right nares.  No thrush.  Oropharynx clear without lesion. Tonguenormal.  Mucous membranes moist. RESPIRATORY:Clear to auscultationwithout rales, wheezes or rhonchi. CARDIOVASCULAR:Regular rate andrhythmwithout murmur, rub or gallop. ABDOMEN:Soft, non-tender, with active bowel sounds,  and no hepatosplenomegaly. No masses. SKIN:  Macular slightly pigmented rash on cheeks. EXTREMITIES: No edema, no skin discoloration or tenderness. No palpable cords. LYMPHNODES: No palpable cervical, supraclavicular, axillary or inguinal adenopathy  NEUROLOGICAL: Unremarkable   Appointment on 10/26/2016  Component Date Value Ref Range Status  . WBC 10/26/2016 6.6  3.6 - 11.0 K/uL Final  . RBC 10/26/2016 3.94  3.80 - 5.20 MIL/uL Final  . Hemoglobin 10/26/2016 12.8  12.0 - 16.0 g/dL Final  . HCT 10/26/2016 36.5  35.0 - 47.0 % Final  . MCV 10/26/2016 92.7  80.0 - 100.0 fL Final  . MCH 10/26/2016 32.6  26.0 - 34.0 pg  Final  . MCHC 10/26/2016 35.2  32.0 - 36.0 g/dL Final  . RDW 10/26/2016 14.5  11.5 - 14.5 % Final  . Platelets 10/26/2016 194  150 - 440 K/uL Final  . Neutrophils Relative % 10/26/2016 64  % Final  . Neutro Abs 10/26/2016 4.2  1.4 - 6.5 K/uL Final  . Lymphocytes Relative 10/26/2016 24  % Final  . Lymphs Abs 10/26/2016 1.6  1.0 - 3.6 K/uL Final  . Monocytes Relative 10/26/2016 9  % Final  . Monocytes Absolute 10/26/2016 0.6  0.2 - 0.9 K/uL Final  . Eosinophils Relative 10/26/2016 2  % Final  . Eosinophils Absolute 10/26/2016 0.1  0 - 0.7 K/uL Final  . Basophils Relative 10/26/2016 1  % Final  . Basophils Absolute 10/26/2016 0.1  0 - 0.1 K/uL Final  . Sodium 10/26/2016 141  135 - 145 mmol/L Final  . Potassium 10/26/2016 3.5  3.5 - 5.1 mmol/L Final  . Chloride 10/26/2016 105  101 - 111 mmol/L Final  . CO2 10/26/2016 29  22 - 32 mmol/L Final  . Glucose, Bld 10/26/2016 87  65 - 99 mg/dL Final  . BUN 10/26/2016 31* 6 - 20 mg/dL Final  . Creatinine, Ser 10/26/2016 1.09* 0.44 - 1.00 mg/dL Final  . Calcium 10/26/2016 9.2  8.9 - 10.3 mg/dL Final  . Total Protein 10/26/2016 7.1  6.5 - 8.1 g/dL Final  . Albumin 10/26/2016 4.1  3.5 - 5.0 g/dL Final  . AST 10/26/2016 15  15 - 41 U/L Final  . ALT 10/26/2016 19  14 - 54 U/L Final  . Alkaline Phosphatase 10/26/2016 133* 38 - 126 U/L Final  . Total Bilirubin 10/26/2016 0.6  0.3 - 1.2 mg/dL Final  . GFR calc non Af Amer 10/26/2016 55* >60 mL/min Final  . GFR calc Af Amer 10/26/2016 >60  >60 mL/min Final   Comment: (NOTE) The eGFR has been calculated using the CKD EPI equation. This calculation has not been validated in all clinical situations. eGFR's persistently <60 mL/min signify possible Chronic Kidney Disease.   . 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 One week prior to presentation,  she felt like she was coming down with something. She denied any fever, runny nose, sore throat or cough. She had some diarrhea. She denied any new medications or herbal products.  Anemia workup revealed revealed a cold autoantibody (IgG and complement).  Reticulocyte count was 11.3%.  Ferritin was 562.  Iron studies revealed a saturation of 20% and a TIBC of 214 (low).  B12 was 231 (low normal).  Folate was 42.   Peripheral smear revealed rouleaux formation.  Additional  testing included the following + studies:  hepatitis C antibody, hepatitis B core antibody, CMV IgM, and EBV VCA (IgM and IgG).  Hepatitis B by PCR was negative.  Hepatitis C RNA was negative.  Mycoplasma pneumonia IgM was negative.  Reticulocyte count was 11.3% (high) indicating appropriate marrow response.  C3 and C4 were normal.  Cold agglutinin titer was negative x 2.  Negative studies included:  ANA, hepatitis B surface antigen, SPEP, and free light chain ratio.   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  MMA was initially 330 (normal).  Repeat MMA was elevated on 08/01/2016 confirming B12 deficiency.  Hepatitis B surface antibody was positive and hepatitis B surface antigen was negative on 08/06/2016.  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 There was no adenopathy. Spleen was normal.  She underwent PTCA and stent placement in right and left common iliac arteries and left external iliac artery on 07/10/2016.  She is on Plavix.  EGD on 03/19/2015 revealed gastritis in the body and antrum. Colonoscopy on 03/19/2015 revealed one hyperplastic polyp. EGD on 07/08/2016 was normal.  No evidence of bleeding.  She has received 6 units of warmed PRBCs to date (last on 08/01/2016). If Rituxan is needed, she requires  entecavir 0.5 mg q day beginning 2 weeks prior to Rituxan.  In addition, hepatitis B viral level and LFTs should be checked monthly.  She began steroids (1 mg/kg) on 08/01/2016.  She is currently on prednisone 10 mg a day.  She is on folic acid 1 mg a day.  She received B12 1000 mcg IM on 08/06/2016.  She is on oral B12.  B12 was 634 on 10/09/2016  She developed peri-oral and intranasal herpes simplex-1.  She has been on valacyclovir and doxycycline since 10/19/2016.  She developed an elevated alkaline phosphatase which is improving  Symptomatically, her perioral and nasal leison crusting over. Culture revealed lesions were HSV-1 positive. Alkaline phosphatase is trending down.   Plan: 1.  Labs today:  CBC with diff, CMP 2.  Decease prednisone to 5 mg as day for one week.  If hematocrit is good in 1 week, plan to decrease to 5 mg every other day for one week then to off after documentation of hematocrit stability and normal cortisol. 3.  RTC weekly for CBC with diff.  Nurse to call patient with result 4.  RTC on 11/16/2016 for MD assessment and labs (CBC with diff).  The patient was seen and examined.  The assessment and plan was discussed with the patient.  We discussed close monitoring of counts and hopeful discontinuation of steroids in the next couple of weeks.  As patient almost off steroids, discuss holding off on reinstitution of Septra unless felt necessary by Dr. Ola Spurr.  Multiple questions were asked and answered.   Faythe Casa, NP  Lequita Asal, MD 10/26/2016, 5:11 PM

## 2016-10-26 ENCOUNTER — Inpatient Hospital Stay (HOSPITAL_BASED_OUTPATIENT_CLINIC_OR_DEPARTMENT_OTHER): Payer: BLUE CROSS/BLUE SHIELD | Admitting: Hematology and Oncology

## 2016-10-26 ENCOUNTER — Inpatient Hospital Stay: Payer: BLUE CROSS/BLUE SHIELD

## 2016-10-26 VITALS — BP 123/73 | HR 79 | Temp 98.4°F | Resp 18 | Wt 162.8 lb

## 2016-10-26 DIAGNOSIS — R238 Other skin changes: Secondary | ICD-10-CM

## 2016-10-26 DIAGNOSIS — D5912 Cold autoimmune hemolytic anemia: Secondary | ICD-10-CM

## 2016-10-26 DIAGNOSIS — D591 Other autoimmune hemolytic anemias: Secondary | ICD-10-CM | POA: Diagnosis not present

## 2016-10-26 DIAGNOSIS — D890 Polyclonal hypergammaglobulinemia: Secondary | ICD-10-CM

## 2016-10-26 DIAGNOSIS — R748 Abnormal levels of other serum enzymes: Secondary | ICD-10-CM

## 2016-10-26 DIAGNOSIS — D599 Acquired hemolytic anemia, unspecified: Secondary | ICD-10-CM

## 2016-10-26 DIAGNOSIS — E538 Deficiency of other specified B group vitamins: Secondary | ICD-10-CM

## 2016-10-26 DIAGNOSIS — Z79899 Other long term (current) drug therapy: Secondary | ICD-10-CM

## 2016-10-26 DIAGNOSIS — B002 Herpesviral gingivostomatitis and pharyngotonsillitis: Secondary | ICD-10-CM

## 2016-10-26 DIAGNOSIS — L930 Discoid lupus erythematosus: Secondary | ICD-10-CM

## 2016-10-26 LAB — CBC WITH DIFFERENTIAL/PLATELET
Basophils Absolute: 0.1 10*3/uL (ref 0–0.1)
Basophils Relative: 1 %
Eosinophils Absolute: 0.1 10*3/uL (ref 0–0.7)
Eosinophils Relative: 2 %
HCT: 36.5 % (ref 35.0–47.0)
Hemoglobin: 12.8 g/dL (ref 12.0–16.0)
Lymphocytes Relative: 24 %
Lymphs Abs: 1.6 10*3/uL (ref 1.0–3.6)
MCH: 32.6 pg (ref 26.0–34.0)
MCHC: 35.2 g/dL (ref 32.0–36.0)
MCV: 92.7 fL (ref 80.0–100.0)
Monocytes Absolute: 0.6 10*3/uL (ref 0.2–0.9)
Monocytes Relative: 9 %
Neutro Abs: 4.2 10*3/uL (ref 1.4–6.5)
Neutrophils Relative %: 64 %
Platelets: 194 10*3/uL (ref 150–440)
RBC: 3.94 MIL/uL (ref 3.80–5.20)
RDW: 14.5 % (ref 11.5–14.5)
WBC: 6.6 10*3/uL (ref 3.6–11.0)

## 2016-10-26 LAB — COMPREHENSIVE METABOLIC PANEL
ALT: 19 U/L (ref 14–54)
AST: 15 U/L (ref 15–41)
Albumin: 4.1 g/dL (ref 3.5–5.0)
Alkaline Phosphatase: 133 U/L — ABNORMAL HIGH (ref 38–126)
Anion gap: 7 (ref 5–15)
BUN: 31 mg/dL — ABNORMAL HIGH (ref 6–20)
CO2: 29 mmol/L (ref 22–32)
Calcium: 9.2 mg/dL (ref 8.9–10.3)
Chloride: 105 mmol/L (ref 101–111)
Creatinine, Ser: 1.09 mg/dL — ABNORMAL HIGH (ref 0.44–1.00)
GFR calc Af Amer: >60 mL/min (ref >60)
GFR calc non Af Amer: 55 mL/min — ABNORMAL LOW (ref >60)
Glucose, Bld: 87 mg/dL (ref 65–99)
Potassium: 3.5 mmol/L (ref 3.5–5.1)
Sodium: 141 mmol/L (ref 135–145)
Total Bilirubin: 0.6 mg/dL (ref 0.3–1.2)
Total Protein: 7.1 g/dL (ref 6.5–8.1)

## 2016-10-26 NOTE — Progress Notes (Signed)
Patient is here for follow up no major complaints

## 2016-10-29 ENCOUNTER — Encounter: Payer: Self-pay | Admitting: Hematology and Oncology

## 2016-10-30 ENCOUNTER — Other Ambulatory Visit: Payer: BLUE CROSS/BLUE SHIELD

## 2016-10-30 ENCOUNTER — Ambulatory Visit: Payer: BLUE CROSS/BLUE SHIELD | Admitting: Hematology and Oncology

## 2016-11-06 ENCOUNTER — Other Ambulatory Visit: Payer: Self-pay | Admitting: *Deleted

## 2016-11-06 ENCOUNTER — Inpatient Hospital Stay: Payer: BLUE CROSS/BLUE SHIELD | Attending: Hematology and Oncology

## 2016-11-06 ENCOUNTER — Telehealth: Payer: Self-pay | Admitting: *Deleted

## 2016-11-06 DIAGNOSIS — R748 Abnormal levels of other serum enzymes: Secondary | ICD-10-CM

## 2016-11-06 DIAGNOSIS — J449 Chronic obstructive pulmonary disease, unspecified: Secondary | ICD-10-CM | POA: Diagnosis not present

## 2016-11-06 DIAGNOSIS — E538 Deficiency of other specified B group vitamins: Secondary | ICD-10-CM | POA: Diagnosis not present

## 2016-11-06 DIAGNOSIS — Z87891 Personal history of nicotine dependence: Secondary | ICD-10-CM | POA: Diagnosis not present

## 2016-11-06 DIAGNOSIS — D599 Acquired hemolytic anemia, unspecified: Secondary | ICD-10-CM

## 2016-11-06 DIAGNOSIS — B002 Herpesviral gingivostomatitis and pharyngotonsillitis: Secondary | ICD-10-CM

## 2016-11-06 DIAGNOSIS — D890 Polyclonal hypergammaglobulinemia: Secondary | ICD-10-CM | POA: Insufficient documentation

## 2016-11-06 DIAGNOSIS — Z79899 Other long term (current) drug therapy: Secondary | ICD-10-CM | POA: Insufficient documentation

## 2016-11-06 DIAGNOSIS — K2970 Gastritis, unspecified, without bleeding: Secondary | ICD-10-CM | POA: Diagnosis not present

## 2016-11-06 DIAGNOSIS — D591 Other autoimmune hemolytic anemias: Secondary | ICD-10-CM | POA: Insufficient documentation

## 2016-11-06 DIAGNOSIS — L930 Discoid lupus erythematosus: Secondary | ICD-10-CM | POA: Diagnosis not present

## 2016-11-06 DIAGNOSIS — Z7902 Long term (current) use of antithrombotics/antiplatelets: Secondary | ICD-10-CM | POA: Diagnosis not present

## 2016-11-06 DIAGNOSIS — E785 Hyperlipidemia, unspecified: Secondary | ICD-10-CM | POA: Insufficient documentation

## 2016-11-06 DIAGNOSIS — I10 Essential (primary) hypertension: Secondary | ICD-10-CM | POA: Insufficient documentation

## 2016-11-06 DIAGNOSIS — Z955 Presence of coronary angioplasty implant and graft: Secondary | ICD-10-CM | POA: Insufficient documentation

## 2016-11-06 LAB — CBC WITH DIFFERENTIAL/PLATELET
Basophils Absolute: 0 10*3/uL (ref 0–0.1)
Basophils Relative: 1 %
Eosinophils Absolute: 0.1 10*3/uL (ref 0–0.7)
Eosinophils Relative: 3 %
HCT: 38.7 % (ref 35.0–47.0)
Hemoglobin: 13.2 g/dL (ref 12.0–16.0)
Lymphocytes Relative: 26 %
Lymphs Abs: 1.2 10*3/uL (ref 1.0–3.6)
MCH: 31.7 pg (ref 26.0–34.0)
MCHC: 34.2 g/dL (ref 32.0–36.0)
MCV: 92.8 fL (ref 80.0–100.0)
Monocytes Absolute: 0.4 10*3/uL (ref 0.2–0.9)
Monocytes Relative: 10 %
Neutro Abs: 2.7 10*3/uL (ref 1.4–6.5)
Neutrophils Relative %: 60 %
Platelets: 133 10*3/uL — ABNORMAL LOW (ref 150–440)
RBC: 4.17 MIL/uL (ref 3.80–5.20)
RDW: 15.2 % — ABNORMAL HIGH (ref 11.5–14.5)
WBC: 4.5 10*3/uL (ref 3.6–11.0)

## 2016-11-06 NOTE — Telephone Encounter (Signed)
Patient reports taking prednisone at 5 mg qod, instructed patient on how to come in for cortisol lab on Monday 3-12 and not take prednisone until after lab draw, voiced understanding.

## 2016-11-09 ENCOUNTER — Inpatient Hospital Stay: Payer: BLUE CROSS/BLUE SHIELD

## 2016-11-09 ENCOUNTER — Telehealth: Payer: Self-pay | Admitting: *Deleted

## 2016-11-09 DIAGNOSIS — D599 Acquired hemolytic anemia, unspecified: Secondary | ICD-10-CM

## 2016-11-09 DIAGNOSIS — D591 Other autoimmune hemolytic anemias: Secondary | ICD-10-CM | POA: Diagnosis not present

## 2016-11-09 DIAGNOSIS — D5912 Cold autoimmune hemolytic anemia: Secondary | ICD-10-CM

## 2016-11-09 DIAGNOSIS — E538 Deficiency of other specified B group vitamins: Secondary | ICD-10-CM

## 2016-11-09 LAB — COMPREHENSIVE METABOLIC PANEL
ALT: 19 U/L (ref 14–54)
AST: 19 U/L (ref 15–41)
Albumin: 4 g/dL (ref 3.5–5.0)
Alkaline Phosphatase: 107 U/L (ref 38–126)
Anion gap: 7 (ref 5–15)
BUN: 20 mg/dL (ref 6–20)
CO2: 30 mmol/L (ref 22–32)
Calcium: 9.2 mg/dL (ref 8.9–10.3)
Chloride: 104 mmol/L (ref 101–111)
Creatinine, Ser: 0.98 mg/dL (ref 0.44–1.00)
GFR calc Af Amer: >60 mL/min (ref >60)
GFR calc non Af Amer: >60 mL/min (ref >60)
Glucose, Bld: 83 mg/dL (ref 65–99)
Potassium: 3.4 mmol/L — ABNORMAL LOW (ref 3.5–5.1)
Sodium: 141 mmol/L (ref 135–145)
Total Bilirubin: 0.5 mg/dL (ref 0.3–1.2)
Total Protein: 7.4 g/dL (ref 6.5–8.1)

## 2016-11-09 LAB — CBC WITH DIFFERENTIAL/PLATELET
Basophils Absolute: 0 10*3/uL (ref 0–0.1)
Basophils Relative: 0 %
Eosinophils Absolute: 0.1 10*3/uL (ref 0–0.7)
Eosinophils Relative: 2 %
HCT: 38 % (ref 35.0–47.0)
Hemoglobin: 13.2 g/dL (ref 12.0–16.0)
Lymphocytes Relative: 23 %
Lymphs Abs: 1 10*3/uL (ref 1.0–3.6)
MCH: 32.3 pg (ref 26.0–34.0)
MCHC: 34.7 g/dL (ref 32.0–36.0)
MCV: 93 fL (ref 80.0–100.0)
Monocytes Absolute: 0.4 10*3/uL (ref 0.2–0.9)
Monocytes Relative: 9 %
Neutro Abs: 2.9 10*3/uL (ref 1.4–6.5)
Neutrophils Relative %: 66 %
Platelets: 138 10*3/uL — ABNORMAL LOW (ref 150–440)
RBC: 4.09 MIL/uL (ref 3.80–5.20)
RDW: 15.3 % — ABNORMAL HIGH (ref 11.5–14.5)
WBC: 4.4 10*3/uL (ref 3.6–11.0)

## 2016-11-09 LAB — CORTISOL: Cortisol, Plasma: 4 ug/dL

## 2016-11-09 LAB — RETICULOCYTES
RBC.: 4.12 MIL/uL (ref 3.80–5.20)
Retic Count, Absolute: 115.4 10*3/uL (ref 19.0–183.0)
Retic Ct Pct: 2.8 % (ref 0.4–3.1)

## 2016-11-09 LAB — LACTATE DEHYDROGENASE: LDH: 186 U/L (ref 98–192)

## 2016-11-09 NOTE — Telephone Encounter (Signed)
Called patient and informed her that her potassium was a little low and that she should increase potassium rich foods in her diet such as bananas and orange juice voiced understanding.

## 2016-11-10 ENCOUNTER — Other Ambulatory Visit: Payer: Self-pay | Admitting: *Deleted

## 2016-11-10 ENCOUNTER — Telehealth: Payer: Self-pay | Admitting: *Deleted

## 2016-11-10 DIAGNOSIS — D599 Acquired hemolytic anemia, unspecified: Secondary | ICD-10-CM

## 2016-11-10 NOTE — Telephone Encounter (Signed)
Called patient and informed her of her cortisol level  And that we need to recheck these labs, appt and labs changed to 3-26 to accommodate the level and patient informed of how to take the medication prior to the actual blood draw, voiced understanding.

## 2016-11-13 ENCOUNTER — Other Ambulatory Visit: Payer: BLUE CROSS/BLUE SHIELD

## 2016-11-16 ENCOUNTER — Ambulatory Visit: Payer: BLUE CROSS/BLUE SHIELD | Admitting: Hematology and Oncology

## 2016-11-16 ENCOUNTER — Other Ambulatory Visit: Payer: BLUE CROSS/BLUE SHIELD

## 2016-11-23 ENCOUNTER — Inpatient Hospital Stay: Payer: BLUE CROSS/BLUE SHIELD

## 2016-11-23 ENCOUNTER — Encounter: Payer: Self-pay | Admitting: *Deleted

## 2016-11-23 ENCOUNTER — Encounter: Payer: Self-pay | Admitting: Hematology and Oncology

## 2016-11-23 ENCOUNTER — Inpatient Hospital Stay (HOSPITAL_BASED_OUTPATIENT_CLINIC_OR_DEPARTMENT_OTHER): Payer: BLUE CROSS/BLUE SHIELD | Admitting: Hematology and Oncology

## 2016-11-23 ENCOUNTER — Telehealth: Payer: Self-pay | Admitting: *Deleted

## 2016-11-23 ENCOUNTER — Other Ambulatory Visit: Payer: Self-pay | Admitting: *Deleted

## 2016-11-23 ENCOUNTER — Emergency Department
Admission: EM | Admit: 2016-11-23 | Discharge: 2016-11-23 | Disposition: A | Discharge status: Home / Self Care | Payer: BLUE CROSS/BLUE SHIELD | Attending: Emergency Medicine | Admitting: Emergency Medicine

## 2016-11-23 VITALS — BP 181/110 | HR 65 | Temp 95.0°F | Resp 18 | Wt 165.4 lb

## 2016-11-23 DIAGNOSIS — D508 Other iron deficiency anemias: Secondary | ICD-10-CM

## 2016-11-23 DIAGNOSIS — J449 Chronic obstructive pulmonary disease, unspecified: Secondary | ICD-10-CM | POA: Diagnosis not present

## 2016-11-23 DIAGNOSIS — Z87891 Personal history of nicotine dependence: Secondary | ICD-10-CM | POA: Diagnosis not present

## 2016-11-23 DIAGNOSIS — R51 Headache: Secondary | ICD-10-CM | POA: Diagnosis present

## 2016-11-23 DIAGNOSIS — B002 Herpesviral gingivostomatitis and pharyngotonsillitis: Secondary | ICD-10-CM

## 2016-11-23 DIAGNOSIS — I10 Essential (primary) hypertension: Secondary | ICD-10-CM | POA: Diagnosis not present

## 2016-11-23 DIAGNOSIS — I2510 Atherosclerotic heart disease of native coronary artery without angina pectoris: Secondary | ICD-10-CM | POA: Insufficient documentation

## 2016-11-23 DIAGNOSIS — Z79899 Other long term (current) drug therapy: Secondary | ICD-10-CM | POA: Diagnosis not present

## 2016-11-23 DIAGNOSIS — R748 Abnormal levels of other serum enzymes: Secondary | ICD-10-CM

## 2016-11-23 DIAGNOSIS — R7989 Other specified abnormal findings of blood chemistry: Secondary | ICD-10-CM

## 2016-11-23 DIAGNOSIS — D591 Other autoimmune hemolytic anemias: Secondary | ICD-10-CM

## 2016-11-23 DIAGNOSIS — E538 Deficiency of other specified B group vitamins: Secondary | ICD-10-CM

## 2016-11-23 DIAGNOSIS — E2740 Unspecified adrenocortical insufficiency: Secondary | ICD-10-CM

## 2016-11-23 DIAGNOSIS — D599 Acquired hemolytic anemia, unspecified: Secondary | ICD-10-CM

## 2016-11-23 LAB — CBC WITH DIFFERENTIAL/PLATELET
Basophils Absolute: 0.1 10*3/uL (ref 0–0.1)
Basophils Relative: 1 %
Eosinophils Absolute: 0.1 10*3/uL (ref 0–0.7)
Eosinophils Relative: 3 %
HCT: 38.4 % (ref 35.0–47.0)
Hemoglobin: 13.3 g/dL (ref 12.0–16.0)
Lymphocytes Relative: 24 %
Lymphs Abs: 1 10*3/uL (ref 1.0–3.6)
MCH: 31.9 pg (ref 26.0–34.0)
MCHC: 34.6 g/dL (ref 32.0–36.0)
MCV: 92.1 fL (ref 80.0–100.0)
Monocytes Absolute: 0.4 10*3/uL (ref 0.2–0.9)
Monocytes Relative: 10 %
Neutro Abs: 2.6 10*3/uL (ref 1.4–6.5)
Neutrophils Relative %: 62 %
Platelets: 133 10*3/uL — ABNORMAL LOW (ref 150–440)
RBC: 4.17 MIL/uL (ref 3.80–5.20)
RDW: 14.7 % — ABNORMAL HIGH (ref 11.5–14.5)
WBC: 4.2 10*3/uL (ref 3.6–11.0)

## 2016-11-23 LAB — COMPREHENSIVE METABOLIC PANEL
ALT: 16 U/L (ref 14–54)
AST: 18 U/L (ref 15–41)
Albumin: 4 g/dL (ref 3.5–5.0)
Alkaline Phosphatase: 83 U/L (ref 38–126)
Anion gap: 4 — ABNORMAL LOW (ref 5–15)
BUN: 22 mg/dL — ABNORMAL HIGH (ref 6–20)
CO2: 30 mmol/L (ref 22–32)
Calcium: 9 mg/dL (ref 8.9–10.3)
Chloride: 105 mmol/L (ref 101–111)
Creatinine, Ser: 0.94 mg/dL (ref 0.44–1.00)
GFR calc Af Amer: >60 mL/min (ref >60)
GFR calc non Af Amer: >60 mL/min (ref >60)
Glucose, Bld: 89 mg/dL (ref 65–99)
Potassium: 4 mmol/L (ref 3.5–5.1)
Sodium: 139 mmol/L (ref 135–145)
Total Bilirubin: 0.6 mg/dL (ref 0.3–1.2)
Total Protein: 7 g/dL (ref 6.5–8.1)

## 2016-11-23 LAB — CORTISOL: Cortisol, Plasma: 2.2 ug/dL

## 2016-11-23 MED ORDER — LISINOPRIL 10 MG PO TABS
10.0000 mg | ORAL_TABLET | Freq: Once | ORAL | Status: AC
  Administered 2016-11-23: 10 mg via ORAL
  Filled 2016-11-23: qty 1

## 2016-11-23 NOTE — ED Triage Notes (Signed)
States she was at cancer center this AM for labs with hx of anemia, pt on predisone, states they took her BP and was told to come to ER, pt states she has not taken BP meds this AM because she just got off work, states slight headache, denies any chest pain, states she takes lisinopril at home

## 2016-11-23 NOTE — Telephone Encounter (Signed)
Called patient to inform her per MD VO to continue her prednisone.  Will redraw labs in one week.

## 2016-11-23 NOTE — Telephone Encounter (Signed)
-----   Message from Lequita Asal, MD sent at 11/23/2016  2:56 PM EDT ----- Regarding: Please call patient  Continue prednisone. Cortisol low.  Recheck in 1 week (same plan).  M  ----- Message ----- From: Interface, Lab In [place] Sent: 11/23/2016   8:31 AM To: Lequita Asal, MD

## 2016-11-23 NOTE — ED Provider Notes (Signed)
[facility] Emergency Department Provider Note  ____________________________________________   First MD Initiated Contact with Patient 11/23/16 1405     (approximate)  I have reviewed the triage vital signs and the nursing notes.   HISTORY  Chief Complaint Hypertension   HPI NINI CAVAN is a 61 y.o. female with a history of hypertension who is presenting to the emergency department today with hypertension. She said that she was seen in the [place] this morning for check of her cortisol level was found to have an elevated blood pressure. The patient was also complaining of mild left-sided headache which she said that she has had all night. She says it is "mild" twinge." She says that she gets headaches often that are similar to this and there is no difference to drain this and other headaches that she has had recently. She denies any vision change. Denies any numbness or weakness. Michela Pitcher that she came directly from her work on third shift to the hospital for her oncology appointment and because of this had not had a chance to take her lisinopril this morning. She was subsequently given lisinopril in the emergency department upon arrival.   Past Medical History:  Diagnosis Date  . Anemia   . COPD (chronic obstructive pulmonary disease) ([place])   . HLD (hyperlipidemia)   . Hypertension     Patient Active Problem List   Diagnosis Date Noted  . Elevated alkaline phosphatase level 10/25/2016  . Oral herpes simplex infection 10/25/2016  . Thrombocytopenia ([place]) 10/04/2016  . B12 deficiency 08/06/2016  . Acute hemolytic anemia ([place]) 08/01/2016  . Symptomatic anemia 07/21/2016  . Atherosclerosis of native arteries of extremity with intermittent claudication ([place]) 07/20/2016  . PVD (peripheral vascular disease) ([place]) 07/12/2016  . 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 Autoimmune hemolytic anemia, cold antibody type ([place])  07/12/2016  . Hepatitis C 07/12/2016  . Hepatitis B 07/12/2016  . Tobacco abuse 07/12/2016  . Anemia 07/07/2016  . Leg pain 07/07/2016  . HTN (hypertension) 07/07/2016  . HLD (hyperlipidemia) 07/07/2016  . COPD (chronic obstructive pulmonary disease) ([place]) 07/07/2016  . Heme positive stool 01/29/2015    Past Surgical History:  Procedure Laterality Date  . ESOPHAGOGASTRODUODENOSCOPY (EGD) WITH PROPOFOL N/A 07/08/2016   Procedure: ESOPHAGOGASTRODUODENOSCOPY (EGD) WITH PROPOFOL;  Surgeon: Manya Silvas, MD;  Location: [facility] ENDOSCOPY;  Service: Endoscopy;  Laterality: N/A;  . KNEE SURGERY    . PERIPHERAL VASCULAR CATHETERIZATION N/A 07/10/2016   Procedure: Lower Extremity Angiography;  Surgeon: Katha Cabal, MD;  Location: [place] [facility] LAB;  Service: Cardiovascular;  Laterality: N/A;  . PERIPHERAL VASCULAR CATHETERIZATION N/A 07/10/2016   Procedure: Abdominal Aortogram w/Lower Extremity;  Surgeon: Katha Cabal, MD;  Location: [place] [facility];  Service: Cardiovascular;  Laterality: N/A;  . PERIPHERAL VASCULAR CATHETERIZATION  07/10/2016   Procedure: Lower Extremity Intervention;  Surgeon: Katha Cabal, MD;  Location: [place] [facility] LAB;  Service: Cardiovascular;;    Prior to Admission medications   Medication Sig Start Date End Date Taking? Authorizing Provider  albuterol (PROVENTIL HFA;VENTOLIN HFA) 108 (90 Base) MCG/ACT inhaler Inhale into the lungs.    Historical Provider, MD  clopidogrel (PLAVIX) 75 MG tablet Take 1 tablet (75 mg total) by mouth daily. 07/13/16   Theodoro Grist, MD  docusate sodium (COLACE) 100 MG capsule Take 1 capsule (100 mg total) by mouth 2 (two) times daily. 07/12/16   Theodoro Grist, MD  folic acid (FOLVITE) 1  MG tablet Take 1 tablet (1 mg total) by mouth daily. 09/04/16   Lequita Asal, MD  lisinopril (PRINIVIL,ZESTRIL) 10 MG tablet Take 10 mg by mouth daily. 09/15/16   Historical Provider, MD  omeprazole (PRILOSEC) 20 MG capsule  Take 1 capsule (20 mg total) by mouth daily. 08/02/16   Fritzi Mandes, MD  pravastatin (PRAVACHOL) 40 MG tablet Take 40 mg by mouth every evening.    Historical Provider, MD  predniSONE (DELTASONE) 20 MG tablet Take 1 tablet by mouth daily Patient taking differently: 10 mg. Take 1 tablet by mouth daily 09/11/16   Lequita Asal, MD  predniSONE (DELTASONE) 5 MG tablet Take 13 tablets (65 mg total) by mouth daily with breakfast. Patient not taking: Reported on 10/02/2016 08/03/16   Fritzi Mandes, MD  predniSONE (DELTASONE) 5 MG tablet Take 1 tablet daily, patient also taking a dose of 60 mg for a total of 65 mg daily Patient not taking: Reported on 10/02/2016 08/06/16   Lequita Asal, MD  sulfamethoxazole-trimethoprim (BACTRIM DS,SEPTRA DS) 800-160 MG tablet Take 1 tablet by mouth 3 (three) times a week. Patient not taking: Reported on 10/19/2016 10/02/16   Lequita Asal, MD  traMADol (ULTRAM) 50 MG tablet Take 1 tablet (50 mg total) by mouth every 6 (six) hours as needed for moderate pain or severe pain. Patient not taking: Reported on 11/23/2016 07/12/16   Theodoro Grist, MD    Allergies Codeine  Family History  Problem Relation Age of Onset  . Diabetes Mother   . Hypertension Mother   . Diabetes Maternal Grandfather   . Hypertension Maternal Grandfather     Social History Social History  Substance Use Topics  . Smoking status: Former Smoker    Packs/day: 0.50    Types: Cigarettes    Quit date: 07/11/2016  . Smokeless tobacco: Never Used  . Alcohol use No    Review of Systems Constitutional: No fever/chills Eyes: No visual changes. ENT: No sore throat. Cardiovascular: Denies chest pain. Respiratory: Denies shortness of breath. Gastrointestinal: No abdominal pain.  No nausea, no vomiting.  No diarrhea.  No constipation. Genitourinary: Negative for dysuria. Musculoskeletal: Negative for back pain. Skin: Negative for rash. Neurological: Negative for  focal weakness or  numbness.  10-point ROS otherwise negative.  ____________________________________________   PHYSICAL EXAM:  VITAL SIGNS: ED Triage Vitals  Enc Vitals Group     BP 11/23/16 1020 (!) 215/90     Pulse Rate 11/23/16 1020 71     Resp 11/23/16 1020 18     Temp 11/23/16 1020 97.8 F (36.6 C)     Temp Source 11/23/16 1020 Oral     SpO2 11/23/16 1020 100 %     Weight 11/23/16 1018 165 lb (74.8 kg)     Height 11/23/16 1018 5\' 3"  (1.6 m)     Head Circumference --      Peak Flow --      Pain Score 11/23/16 1018 2     Pain Loc --      Pain Edu? --      Excl. in [place]? --     Constitutional: Alert and oriented. Well appearing and in no acute distress. Eyes: Conjunctivae are normal. PERRL. EOMI. Head: Atraumatic. Nose: No congestion/rhinnorhea. Mouth/Throat: Mucous membranes are moist.   Neck: No stridor.   Cardiovascular: Normal rate, regular rhythm. Grossly normal heart sounds.   Respiratory: Normal respiratory effort.  No retractions. Lungs CTAB. Gastrointestinal: Soft and nontender. No distention.  Musculoskeletal: No  lower extremity tenderness nor edema.  No joint effusions. Neurologic:  Normal speech and language. No gross focal neurologic deficits are appreciated. No gait instability. Skin:  Skin is warm, dry and intact. No rash noted. Psychiatric: Mood and affect are normal. Speech and behavior are normal.  ____________________________________________   LABS (all labs ordered are listed, but only abnormal results are displayed)  Labs Reviewed - No data to display ____________________________________________  EKG   ____________________________________________  RADIOLOGY   ____________________________________________   PROCEDURES  Procedure(s) performed:   Procedures  Critical Care performed:   ____________________________________________   INITIAL IMPRESSION / ASSESSMENT AND PLAN / ED COURSE  Pertinent labs & imaging results that were available during  my care of the patient were reviewed by me and considered in my medical decision making (see chart for details).  Patient is neurologically intact. Blood pressure in the 200s or 90s. She'll be following with her primary care doctor for further blood pressure checks. She said that she just had her lisinopril changed from 5-10 mg this past February. Headache likely unrelated to her hypertension. Patient to be discharged home.      ____________________________________________   FINAL CLINICAL IMPRESSION(S) / ED DIAGNOSES  Hypertension.    NEW MEDICATIONS STARTED DURING THIS VISIT:  New Prescriptions   No medications on file     Note:  This document was prepared using Dragon voice recognition software and may include unintentional dictation errors.    Orbie Pyo, MD 11/23/16 1434

## 2016-11-23 NOTE — Telephone Encounter (Signed)
BP elevated in clinic today, unable to get appt with PCP this am, patient instructed to go to the ED for evaluation r/t elevated BP and headaches.  Voiced understanding.

## 2016-11-23 NOTE — Progress Notes (Signed)
[facility] day:  11/23/2016  Chief Complaint: Tina Page is a 61 y.o. 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  HPI:  The patient was last seen in the hematology clinic on 10/26/2016.  At that time, her perioral and nasal leisons were crusting over. 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 Alkaline phosphatase was trending down.  Hematocrit was 36.5 with a hemoglobin of 12.8.  Prednisone was decreased from 10 mg to 5 mg.  She has had weekly labs with ongoing prednisone taper.  Hematocrit was 38 with a hemoglobin of 13.2 on 11/09/2016.  Cortisol level was 4.0 (low).  She continued prednisone 5 mg every other day.  During the interim, she feels well, although a bit fatigued this morning after 3rd shift work and no rest this morning.  Her blood pressure is high, 181/110.  She reports not taking her blood pressure medicine this morning.  Perioral and nasal lesions have healed with only slight skin redness.  She continues to use cream on the area.    Past Medical History:  Diagnosis Date  . Anemia   . COPD (chronic obstructive pulmonary disease) ([place])   . HLD (hyperlipidemia)   . Hypertension     Past Surgical History:  Procedure Laterality Date  . ESOPHAGOGASTRODUODENOSCOPY (EGD) WITH PROPOFOL N/A 07/08/2016   Procedure: ESOPHAGOGASTRODUODENOSCOPY (EGD) WITH PROPOFOL;  Surgeon: Manya Silvas, MD;  Location: [facility] ENDOSCOPY;  Service: Endoscopy;  Laterality: N/A;  . KNEE SURGERY    . PERIPHERAL VASCULAR CATHETERIZATION N/A 07/10/2016   Procedure: Lower Extremity Angiography;  Surgeon: Katha Cabal, MD;  Location: [place] [facility];  Service: Cardiovascular;  Laterality: N/A;  . PERIPHERAL VASCULAR CATHETERIZATION N/A 07/10/2016   Procedure: Abdominal Aortogram w/Lower Extremity;  Surgeon: Katha Cabal, MD;  Location: [place] [facility];  Service: Cardiovascular;   Laterality: N/A;  . PERIPHERAL VASCULAR CATHETERIZATION  07/10/2016   Procedure: Lower Extremity Intervention;  Surgeon: Katha Cabal, MD;  Location: [facility];  Service: Cardiovascular;;    Family History  Problem Relation Age of Onset  . Diabetes Mother   . Hypertension Mother   . Diabetes Maternal Grandfather   . Hypertension Maternal Grandfather     Social History:  reports that she quit smoking about 4 months ago. Her smoking use included Cigarettes. She smoked 0.50 packs per day. She has never used smokeless tobacco. She reports that she does not drink alcohol or use drugs.  She quit smoking in 07/2016.  The patient works at [employer]  She is exposed to cold temperatures.  She has a daughter, Tina Page and a daughter named Tina Page.  She is alone today.   Allergies:  Allergies  Allergen Reactions  . Codeine Anaphylaxis    Current Medications: Current Outpatient Prescriptions  Medication Sig Dispense Refill  . albuterol (PROVENTIL HFA;VENTOLIN HFA) 108 (90 Base) MCG/ACT inhaler Inhale into the lungs.    . clopidogrel (PLAVIX) 75 MG tablet Take 1 tablet (75 mg total) by mouth daily. 30 tablet 5  . docusate sodium (COLACE) 100 MG capsule Take 1 capsule (100 mg total) by mouth 2 (two) times daily. 10 capsule 0  . folic acid (FOLVITE) 1 MG tablet Take 1 tablet (1 mg total) by mouth daily. 90 tablet 1  . lisinopril (PRINIVIL,ZESTRIL) 10 MG tablet Take 10 mg by mouth daily.    [name] Kitchen omeprazole (PRILOSEC) 20 MG capsule  Take 1 capsule (20 mg total) by mouth daily. 60 capsule 0  . pravastatin (PRAVACHOL) 40 MG tablet Take 40 mg by mouth every evening.    . predniSONE (DELTASONE) 20 MG tablet Take 1 tablet by mouth daily (Patient taking differently: 10 mg. Take 1 tablet by mouth daily) 30 tablet 2  . predniSONE (DELTASONE) 5 MG tablet Take 13 tablets (65 mg total) by mouth daily with breakfast. (Patient not taking: Reported on 10/02/2016) 50 tablet 1  . predniSONE  (DELTASONE) 5 MG tablet Take 1 tablet daily, patient also taking a dose of 60 mg for a total of 65 mg daily (Patient not taking: Reported on 10/02/2016) 30 tablet 0  . sulfamethoxazole-trimethoprim (BACTRIM DS,SEPTRA DS) 800-160 MG tablet Take 1 tablet by mouth 3 (three) times a week. (Patient not taking: Reported on 10/19/2016) 30 tablet 0  . traMADol (ULTRAM) 50 MG tablet Take 1 tablet (50 mg total) by mouth every 6 (six) hours as needed for moderate pain or severe pain. (Patient not taking: Reported on 11/23/2016) 30 tablet 0   No current facility-administered medications for this visit.     Review of Systems:  GENERAL: Feels good. No fevers or sweats.  Weight gain of 3 pounds since last visit. PERFORMANCE STATUS (ECOG): 1 HEENT: Healed lesions above upper lip. No visual changes, sore throat, mouth sores or tenderness. Lungs: No shortness of breath or cough. No hemoptysis. Cardiac: No chest pain, palpitations, orthopnea, or PND.  Elevated blood pressure. GI: Appetite normal.  No nausea, vomiting, diarrhea, constipation, melena or hematochezia. GU: No urgency, frequency, dysuria, or hematuria. Musculoskeletal: No back pain. No joint pain. No muscle tenderness. Extremities: No pain or swelling. Skin: Discoid lupus. Numerous vesicles on lips, nose and up nostrils. Herpetic lesions crusted over. Neuro: Headache on left side. No weakness, balance or coordination issues. Endocrine: No diabetes, thyroid issues, hot flashes or night sweats. Psych: No mood changes, depression or anxiety. Pain:  No back pain. No joint pain. No muscle aches. Review of systems: All other systems reviewed and found to be negative.  Physical Exam: Blood pressure (!) 181/110, pulse 65, temperature (!) 95 F (35 C), temperature source Tympanic, resp. rate 18, weight 165 lb 7 oz (75 kg). GENERAL:Well developed, well nourished, woman sitting comfortably in the exam room in no acute distress. MENTAL STATUS:  Alert and oriented to person, place and time. HEAD:Long gray hair. Normocephalic, atraumatic, face full and symmetric.  Cushingoid features. EYES:Glasses.  Blue eyes.  Pupils equal round and reactive to light and accomodation. No conjunctivitis or scleral icterus. ENT: Healed lesions, redness on philtrum. Oropharynx clear without lesion. Tonguenormal.  Mucous membranes moist. RESPIRATORY:Clear to auscultationwithout rales, wheezes or rhonchi. CARDIOVASCULAR:Regular rate andrhythmwithout murmur, rub or gallop. ABDOMEN:Soft, non-tender, with active bowel sounds, and no hepatosplenomegaly. No masses. SKIN:  Macular slightly pigmented rash on philtrum.  No rashes or ulcers. EXTREMITIES: Varicose veins on bilateral lower extremities. No edema. No tenderness. No palpable cords. LYMPHNODES: No palpable cervical, supraclavicular, axillary or inguinal adenopathy  NEUROLOGICAL: Unremarkable   Appointment on 11/23/2016  Component Date Value Ref Range Status  . WBC 11/23/2016 4.2  3.6 - 11.0 K/uL Final  . RBC 11/23/2016 4.17  3.80 - 5.20 MIL/uL Final  . Hemoglobin 11/23/2016 13.3  12.0 - 16.0 g/dL Final  . HCT 11/23/2016 38.4  35.0 - 47.0 % Final  . MCV 11/23/2016 92.1  80.0 - 100.0 fL Final  . MCH 11/23/2016 31.9  26.0 - 34.0 pg Final  . MCHC  11/23/2016 34.6  32.0 - 36.0 g/dL Final  . RDW 11/23/2016 14.7* 11.5 - 14.5 % Final  . Platelets 11/23/2016 133* 150 - 440 K/uL Final  . Neutrophils Relative % 11/23/2016 62  % Final  . Neutro Abs 11/23/2016 2.6  1.4 - 6.5 K/uL Final  . Lymphocytes Relative 11/23/2016 24  % Final  . Lymphs Abs 11/23/2016 1.0  1.0 - 3.6 K/uL Final  . Monocytes Relative 11/23/2016 10  % Final  . Monocytes Absolute 11/23/2016 0.4  0.2 - 0.9 K/uL Final  . Eosinophils Relative 11/23/2016 3  % Final  . Eosinophils Absolute 11/23/2016 0.1  0 - 0.7 K/uL Final  . Basophils Relative 11/23/2016 1  % Final  . Basophils Absolute 11/23/2016 0.1  0 - 0.1 K/uL Final   . Sodium 11/23/2016 139  135 - 145 mmol/L Final  . Potassium 11/23/2016 4.0  3.5 - 5.1 mmol/L Final  . Chloride 11/23/2016 105  101 - 111 mmol/L Final  . CO2 11/23/2016 30  22 - 32 mmol/L Final  . Glucose, Bld 11/23/2016 89  65 - 99 mg/dL Final  . BUN 11/23/2016 22* 6 - 20 mg/dL Final  . Creatinine, Ser 11/23/2016 0.94  0.44 - 1.00 mg/dL Final  . Calcium 11/23/2016 9.0  8.9 - 10.3 mg/dL Final  . Total Protein 11/23/2016 7.0  6.5 - 8.1 g/dL Final  . Albumin 11/23/2016 4.0  3.5 - 5.0 g/dL Final  . AST 11/23/2016 18  15 - 41 U/L Final  . ALT 11/23/2016 16  14 - 54 U/L Final  . Alkaline Phosphatase 11/23/2016 83  38 - 126 U/L Final  . Total Bilirubin 11/23/2016 0.6  0.3 - 1.2 mg/dL Final  . GFR calc non Af Amer 11/23/2016 >60  >60 mL/min Final  . GFR calc Af Amer 11/23/2016 >60  >60 mL/min Final   Comment: (NOTE) The eGFR has been calculated using the CKD EPI equation. This calculation has not been validated in all clinical situations. eGFR's persistently <60 mL/min signify possible Chronic Kidney Disease.   . 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 One week prior to presentation, she felt like she was coming down with something. She denied any fever, runny nose, sore throat or cough. She had some diarrhea. She denied any new medications or herbal products.  Anemia workup revealed a cold autoantibody (IgG and complement).  Reticulocyte count was 11.3%.  Ferritin was 562.  Iron studies revealed a saturation of 20% and a TIBC of 214 (low).  B12 was 231 (low normal).  Folate was 42.   Peripheral smear revealed rouleaux formation.  Additional testing included the following + studies:  hepatitis C antibody, hepatitis B core antibody, CMV IgM, and EBV VCA (IgM and IgG).  Hepatitis B by PCR was negative.  Hepatitis C RNA was negative.   Mycoplasma pneumonia IgM was negative.  Reticulocyte count was 11.3% (high) indicating appropriate marrow response.  C3 and C4 were normal.  Cold agglutinin titer was negative x 2.  Negative studies included:  ANA, hepatitis B surface antigen, SPEP, and free light chain ratio.   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  MMA was initially 330 (normal).  Repeat MMA was elevated on 08/01/2016 confirming B12 deficiency.  Hepatitis B surface antibody was positive and  hepatitis B surface antigen was negative on 08/06/2016.  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 There was no adenopathy. Spleen was normal.  She underwent PTCA and stent placement in right and left common iliac arteries and left external iliac artery on 07/10/2016.  She is on Plavix.  EGD on 03/19/2015 revealed gastritis in the body and antrum. Colonoscopy on 03/19/2015 revealed one hyperplastic polyp. EGD on 07/08/2016 was normal.  No evidence of bleeding.  She has received 6 units of warmed PRBCs to date (last on 08/01/2016). If Rituxan is needed, she requires entecavir 0.5 mg q day beginning 2 weeks prior to Rituxan.  In addition, hepatitis B viral level and LFTs should be checked monthly.  She began steroids (1 mg/kg) on 08/01/2016.  She is currently on prednisone 5 mg every other day.  She is on folic acid 1 mg a day.  She received B12 1000 mcg IM on 08/06/2016.  She is on oral B12.  B12 was 634 on 10/09/2016  She developed peri-oral and intranasal herpes simplex-1.  She was treated with valacyclovir and doxycycline starting 10/19/2016.  She developed an elevated alkaline phosphatase (now normal).  Symptomatically, she feels good.  Perioral and nasal lesion have healed.   Hematocrit is normal.  Cortisol is low.   Blood pressure is elevated.  Plan: 1.  Labs today:  CBC with diff, CMP, cortisol level. 2.  Evaluation in ER for elevated BP. 3.  NP to call patient with cortisol level drawn today. 4.  Anticipate discontinuation of prednisone 5 mg every other day if cortisol normal. 5.  RTC in 1 month for MD assessment and labs (CBC with diff).   Lucendia Herrlich, NP 11/23/2016, 9:43 AM   I saw and evaluated the patient, participating in the key portions of the service and reviewing pertinent diagnostic studies and records.  I reviewed the nurse practitioner's note and agree with the findings and the plan.  The assessment and plan were discussed with the patient.  A few questions were asked by the patient and answered.    Lequita Asal, MD 11/23/2016

## 2016-11-23 NOTE — Progress Notes (Signed)
Patient offers no complaints today. Patient states she just got off third shift and has not had her BP meds since yesterday.

## 2016-11-30 ENCOUNTER — Inpatient Hospital Stay: Payer: BLUE CROSS/BLUE SHIELD | Attending: Hematology and Oncology

## 2016-11-30 DIAGNOSIS — E538 Deficiency of other specified B group vitamins: Secondary | ICD-10-CM | POA: Diagnosis not present

## 2016-11-30 DIAGNOSIS — D508 Other iron deficiency anemias: Secondary | ICD-10-CM

## 2016-11-30 DIAGNOSIS — Z7902 Long term (current) use of antithrombotics/antiplatelets: Secondary | ICD-10-CM | POA: Insufficient documentation

## 2016-11-30 DIAGNOSIS — Z87891 Personal history of nicotine dependence: Secondary | ICD-10-CM | POA: Insufficient documentation

## 2016-11-30 DIAGNOSIS — D890 Polyclonal hypergammaglobulinemia: Secondary | ICD-10-CM | POA: Insufficient documentation

## 2016-11-30 DIAGNOSIS — J449 Chronic obstructive pulmonary disease, unspecified: Secondary | ICD-10-CM | POA: Insufficient documentation

## 2016-11-30 DIAGNOSIS — E785 Hyperlipidemia, unspecified: Secondary | ICD-10-CM | POA: Diagnosis not present

## 2016-11-30 DIAGNOSIS — D591 Other autoimmune hemolytic anemias: Secondary | ICD-10-CM | POA: Diagnosis present

## 2016-11-30 DIAGNOSIS — I10 Essential (primary) hypertension: Secondary | ICD-10-CM | POA: Diagnosis not present

## 2016-11-30 DIAGNOSIS — Z951 Presence of aortocoronary bypass graft: Secondary | ICD-10-CM | POA: Diagnosis not present

## 2016-11-30 DIAGNOSIS — Z79899 Other long term (current) drug therapy: Secondary | ICD-10-CM | POA: Diagnosis not present

## 2016-11-30 LAB — COMPREHENSIVE METABOLIC PANEL
ALT: 14 U/L (ref 14–54)
AST: 18 U/L (ref 15–41)
Albumin: 4.2 g/dL (ref 3.5–5.0)
Alkaline Phosphatase: 80 U/L (ref 38–126)
Anion gap: 7 (ref 5–15)
BUN: 23 mg/dL — ABNORMAL HIGH (ref 6–20)
CO2: 30 mmol/L (ref 22–32)
Calcium: 9.3 mg/dL (ref 8.9–10.3)
Chloride: 104 mmol/L (ref 101–111)
Creatinine, Ser: 1.08 mg/dL — ABNORMAL HIGH (ref 0.44–1.00)
GFR calc Af Amer: >60 mL/min (ref >60)
GFR calc non Af Amer: 55 mL/min — ABNORMAL LOW (ref >60)
Glucose, Bld: 97 mg/dL (ref 65–99)
Potassium: 3.5 mmol/L (ref 3.5–5.1)
Sodium: 141 mmol/L (ref 135–145)
Total Bilirubin: 0.6 mg/dL (ref 0.3–1.2)
Total Protein: 7.2 g/dL (ref 6.5–8.1)

## 2016-11-30 LAB — CBC WITH DIFFERENTIAL/PLATELET
Basophils Absolute: 0.1 10*3/uL (ref 0–0.1)
Basophils Relative: 3 %
Eosinophils Absolute: 0.1 10*3/uL (ref 0–0.7)
Eosinophils Relative: 2 %
HCT: 38.5 % (ref 35.0–47.0)
Hemoglobin: 13.5 g/dL (ref 12.0–16.0)
Lymphocytes Relative: 25 %
Lymphs Abs: 1 10*3/uL (ref 1.0–3.6)
MCH: 32 pg (ref 26.0–34.0)
MCHC: 35.1 g/dL (ref 32.0–36.0)
MCV: 91.2 fL (ref 80.0–100.0)
Monocytes Absolute: 0.4 10*3/uL (ref 0.2–0.9)
Monocytes Relative: 9 %
Neutro Abs: 2.5 10*3/uL (ref 1.4–6.5)
Neutrophils Relative %: 61 %
Platelets: 129 10*3/uL — ABNORMAL LOW (ref 150–440)
RBC: 4.22 MIL/uL (ref 3.80–5.20)
RDW: 14 % (ref 11.5–14.5)
WBC: 4 10*3/uL (ref 3.6–11.0)

## 2016-11-30 LAB — CORTISOL: Cortisol, Plasma: 5.6 ug/dL

## 2016-12-01 ENCOUNTER — Other Ambulatory Visit: Payer: Self-pay | Admitting: *Deleted

## 2016-12-01 ENCOUNTER — Telehealth: Payer: Self-pay | Admitting: *Deleted

## 2016-12-01 DIAGNOSIS — D599 Acquired hemolytic anemia, unspecified: Secondary | ICD-10-CM

## 2016-12-01 NOTE — Telephone Encounter (Signed)
Called patient to inquire how she is taking her prednisone.  Patient states she is taking every other day.  Informed her Cortisol levels are still low.  Continue prednisone and will recheck next Tuesday @ 8 am.  Advised her not to take the prednisone before she has the labs. Verbalized understanding.

## 2016-12-01 NOTE — Telephone Encounter (Signed)
-----   Message from Lequita Asal, MD sent at 12/01/2016  4:08 AM EDT ----- Regarding: Please call patient  Cortisol level still low.  Continue prednisone.  Let's retest next week.  Same plan.  M  ----- Message ----- From: Interface, Lab In [place] Sent: 11/30/2016   9:09 AM To: Lequita Asal, MD

## 2016-12-08 ENCOUNTER — Inpatient Hospital Stay: Payer: BLUE CROSS/BLUE SHIELD

## 2016-12-08 DIAGNOSIS — D599 Acquired hemolytic anemia, unspecified: Secondary | ICD-10-CM

## 2016-12-08 DIAGNOSIS — D591 Other autoimmune hemolytic anemias: Secondary | ICD-10-CM | POA: Diagnosis not present

## 2016-12-08 LAB — CORTISOL: Cortisol, Plasma: 6.4 ug/dL

## 2016-12-09 ENCOUNTER — Telehealth: Payer: Self-pay | Admitting: *Deleted

## 2016-12-09 NOTE — Telephone Encounter (Signed)
-----   Message from Lequita Asal, MD sent at 12/08/2016  5:24 PM EDT ----- Regarding: Please call patient  Cortisol still low, but improving.  Continue steroids.  Recheck in 1-2 weeks.  M  ----- Message ----- From: Interface, Lab In [place] Sent: 12/08/2016  12:39 PM To: Lequita Asal, MD

## 2016-12-09 NOTE — Telephone Encounter (Signed)
Attempted to call patient.  No answer and no voice mail available.  Will retry later.

## 2016-12-10 ENCOUNTER — Telehealth: Payer: Self-pay | Admitting: *Deleted

## 2016-12-10 NOTE — Telephone Encounter (Signed)
Patient informed of cortisol levels continue to be low, continue steroids per MD orders and will recheck at next appt. Voiced understanding.

## 2016-12-24 ENCOUNTER — Inpatient Hospital Stay (HOSPITAL_BASED_OUTPATIENT_CLINIC_OR_DEPARTMENT_OTHER): Payer: BLUE CROSS/BLUE SHIELD | Admitting: Hematology and Oncology

## 2016-12-24 ENCOUNTER — Inpatient Hospital Stay: Payer: BLUE CROSS/BLUE SHIELD

## 2016-12-24 ENCOUNTER — Encounter: Payer: Self-pay | Admitting: Hematology and Oncology

## 2016-12-24 VITALS — BP 159/89 | HR 82 | Temp 96.7°F | Resp 18 | Wt 168.4 lb

## 2016-12-24 DIAGNOSIS — Z79899 Other long term (current) drug therapy: Secondary | ICD-10-CM

## 2016-12-24 DIAGNOSIS — D890 Polyclonal hypergammaglobulinemia: Secondary | ICD-10-CM

## 2016-12-24 DIAGNOSIS — D591 Other autoimmune hemolytic anemias: Secondary | ICD-10-CM

## 2016-12-24 DIAGNOSIS — E538 Deficiency of other specified B group vitamins: Secondary | ICD-10-CM

## 2016-12-24 DIAGNOSIS — D5912 Cold autoimmune hemolytic anemia: Secondary | ICD-10-CM

## 2016-12-24 DIAGNOSIS — R748 Abnormal levels of other serum enzymes: Secondary | ICD-10-CM

## 2016-12-24 DIAGNOSIS — D599 Acquired hemolytic anemia, unspecified: Secondary | ICD-10-CM

## 2016-12-24 LAB — CBC WITH DIFFERENTIAL/PLATELET
Basophils Absolute: 0 10*3/uL (ref 0–0.1)
Basophils Relative: 0 %
Eosinophils Absolute: 0.2 10*3/uL (ref 0–0.7)
Eosinophils Relative: 3 %
HCT: 40.1 % (ref 35.0–47.0)
Hemoglobin: 13.9 g/dL (ref 12.0–16.0)
Lymphocytes Relative: 26 %
Lymphs Abs: 1.2 10*3/uL (ref 1.0–3.6)
MCH: 31 pg (ref 26.0–34.0)
MCHC: 34.8 g/dL (ref 32.0–36.0)
MCV: 89.2 fL (ref 80.0–100.0)
Monocytes Absolute: 0.4 10*3/uL (ref 0.2–0.9)
Monocytes Relative: 10 %
Neutro Abs: 2.7 10*3/uL (ref 1.4–6.5)
Neutrophils Relative %: 61 %
Platelets: 133 10*3/uL — ABNORMAL LOW (ref 150–440)
RBC: 4.49 MIL/uL (ref 3.80–5.20)
RDW: 14 % (ref 11.5–14.5)
WBC: 4.5 10*3/uL (ref 3.6–11.0)

## 2016-12-24 NOTE — Progress Notes (Signed)
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  HPI:  The patient was last seen in the hematology clinic on 11/23/2016.  At that time, she felt well.  She was fatigued secondary to lack of sleep (3rd shift).  Blood pressure was elevated (181/110).  She was referred to the ER.  She had a headache.  Blood pressure was 215/90.  She was scheduled to follow-up with her PCP.  At last visit, hematocrit was 38.4 with a hemoglobin of 13.3  She remained on prednisone 5 mg QOD as her serum cortisol was low (2.2).  Repeat cortisol was 5.6 (11/30/2016) and 6.4 (12/08/2016).  During the interim, she has done well.  She has remained on prednisone 5 mg every other day.  She notes that her physician increased her lisinopril yesterday to 40 mg a day.    Past Medical History:  Diagnosis Date  . Anemia   . COPD (chronic obstructive pulmonary disease) ([place])   . HLD (hyperlipidemia)   . Hypertension     Past Surgical History:  Procedure Laterality Date  . ESOPHAGOGASTRODUODENOSCOPY (EGD) WITH PROPOFOL N/A 07/08/2016   Procedure: ESOPHAGOGASTRODUODENOSCOPY (EGD) WITH PROPOFOL;  Surgeon: Manya Silvas, MD;  Location: [facility] ENDOSCOPY;  Service: Endoscopy;  Laterality: N/A;  . KNEE SURGERY    . PERIPHERAL VASCULAR CATHETERIZATION N/A 07/10/2016   Procedure: Lower Extremity Angiography;  Surgeon: Katha Cabal, MD;  Location: [place] [facility];  Service: Cardiovascular;  Laterality: N/A;  . PERIPHERAL VASCULAR CATHETERIZATION N/A 07/10/2016   Procedure: Abdominal Aortogram w/Lower Extremity;  Surgeon: Katha Cabal, MD;  Location: [place] [facility];  Service: Cardiovascular;  Laterality: N/A;  . PERIPHERAL VASCULAR CATHETERIZATION  07/10/2016   Procedure: Lower Extremity Intervention;  Surgeon: Katha Cabal, MD;  Location: [place] [facility];   Service: Cardiovascular;;    Family History  Problem Relation Age of Onset  . Diabetes Mother   . Hypertension Mother   . Diabetes Maternal Grandfather   . Hypertension Maternal Grandfather     Social History:  reports that she quit smoking about 5 months ago. Her smoking use included Cigarettes. She smoked 0.50 packs per day. She has never used smokeless tobacco. She reports that she does not drink alcohol or use drugs.  The patient works at [employer]  She is exposed to cold temperatures.  She has a daughter, Tina Page and a daughter named Tina Page.  She is alone today.   Allergies:  Allergies  Allergen Reactions  . Codeine Anaphylaxis    Current Medications: Current Outpatient Prescriptions  Medication Sig Dispense Refill  . albuterol (PROVENTIL HFA;VENTOLIN HFA) 108 (90 Base) MCG/ACT inhaler Inhale into the lungs.    . clopidogrel (PLAVIX) 75 MG tablet Take 1 tablet (75 mg total) by mouth daily. 30 tablet 5  . docusate sodium (COLACE) 100 MG capsule Take 1 capsule (100 mg total) by mouth 2 (two) times daily. 10 capsule 0  . folic acid (FOLVITE) 1 MG tablet Take 1 tablet (1 mg total) by mouth daily. 90 tablet 1  . lisinopril (PRINIVIL,ZESTRIL) 40 MG tablet Take 40 mg by mouth daily.    [name] Kitchen omeprazole (PRILOSEC) 20 MG capsule Take 1 capsule (20 mg total) by mouth daily. 60 capsule 0  . pravastatin (PRAVACHOL) 40 MG tablet Take 40 mg by mouth every evening.    [name] Kitchen  predniSONE (DELTASONE) 20 MG tablet Take 1 tablet by mouth daily (Patient taking differently: 10 mg. Take 1 tablet by mouth daily) 30 tablet 2  . predniSONE (DELTASONE) 5 MG tablet Take 13 tablets (65 mg total) by mouth daily with breakfast. (Patient not taking: Reported on 10/02/2016) 50 tablet 1  . predniSONE (DELTASONE) 5 MG tablet Take 1 tablet daily, patient also taking a dose of 60 mg for a total of 65 mg daily (Patient not taking: Reported on 10/02/2016) 30 tablet 0  . sulfamethoxazole-trimethoprim (BACTRIM  DS,SEPTRA DS) 800-160 MG tablet Take 1 tablet by mouth 3 (three) times a week. (Patient not taking: Reported on 10/19/2016) 30 tablet 0  . traMADol (ULTRAM) 50 MG tablet Take 1 tablet (50 mg total) by mouth every 6 (six) hours as needed for moderate pain or severe pain. (Patient not taking: Reported on 11/23/2016) 30 tablet 0   No current facility-administered medications for this visit.     Review of Systems:  GENERAL: Feels fine. No fevers or sweats.  Weight up 3 pounds since last visit. PERFORMANCE STATUS (ECOG): 1 HEENT: No visual changes, sore throat, mouth sores or tenderness. Lungs: No shortness of breath or cough. No hemoptysis. Cardiac: No chest pain, palpitations, orthopnea, or PND. GI: Appetite normal.  No nausea, vomiting, diarrhea, constipation, melena or hematochezia. GU: No urgency, frequency, dysuria, or hematuria. Musculoskeletal: No back pain. No joint pain. No muscle tenderness. Extremities: No pain or swelling. Skin: Discoid lupus. No rashes, skin changes or ulcers. Neuro: No headache, weakness, balance or coordination issues. Endocrine: No diabetes, thyroid issues, hot flashes or night sweats. Psych: No mood changes, depression or anxiety. Pain:  No back pain. No joint pain. No muscle aches. Review of systems: All other systems reviewed and found to be negative.  Physical Exam: Blood pressure (!) 159/89, pulse 82, temperature (!) 96.7 F (35.9 C), temperature source Tympanic, resp. rate 18, weight 168 lb 7 oz (76.4 kg). GENERAL:Well developed, well nourished, woman sitting comfortably in the exam room in no acute distress. MENTAL STATUS: Alert and oriented to person, place and time. EYES:Glasses.  Blue eyes.  Pupils equal round and reactive to light and accomodation. No conjunctivitis or scleral icterus. IEP:PIRJJOACZY clear without lesion. Tonguenormal. Mucous membranes moist. RESPIRATORY:Clear to auscultationwithout rales, wheezes or  rhonchi. CARDIOVASCULAR:Regular rate andrhythmwithout murmur, rub or gallop. ABDOMEN:Soft, non-tender, with active bowel sounds, and no hepatosplenomegaly. No masses. SKIN: Slightly increased pigmentation upper lip.  Livedo reticularis across chest. EXTREMITIES: No edema, no skin discoloration or tenderness. No palpable cords. LYMPHNODES: No palpable cervical, supraclavicular, axillary or inguinal adenopathy  NEUROLOGICAL: Unremarkable   Appointment on 12/24/2016  Component Date Value Ref Range Status  . WBC 12/24/2016 4.5  3.6 - 11.0 K/uL Final  . RBC 12/24/2016 4.49  3.80 - 5.20 MIL/uL Final  . Hemoglobin 12/24/2016 13.9  12.0 - 16.0 g/dL Final  . HCT 12/24/2016 40.1  35.0 - 47.0 % Final  . MCV 12/24/2016 89.2  80.0 - 100.0 fL Final  . MCH 12/24/2016 31.0  26.0 - 34.0 pg Final  . MCHC 12/24/2016 34.8  32.0 - 36.0 g/dL Final  . RDW 12/24/2016 14.0  11.5 - 14.5 % Final  . Platelets 12/24/2016 133* 150 - 440 K/uL Final  . Neutrophils Relative % 12/24/2016 61  % Final  . Neutro Abs 12/24/2016 2.7  1.4 - 6.5 K/uL Final  . Lymphocytes Relative 12/24/2016 26  % Final  . Lymphs Abs 12/24/2016 1.2  1.0 - 3.6 K/uL Final  .  Monocytes Relative 12/24/2016 10  % Final  . Monocytes Absolute 12/24/2016 0.4  0.2 - 0.9 K/uL Final  . Eosinophils Relative 12/24/2016 3  % Final  . Eosinophils Absolute 12/24/2016 0.2  0 - 0.7 K/uL Final  . Basophils Relative 12/24/2016 0  % Final  . 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 One week prior to presentation, she felt like she was coming down with something. She denied any fever, runny nose, sore throat or cough. She had some diarrhea. She denied any new medications or herbal products.  Anemia workup revealed a cold autoantibody (IgG and complement).  Reticulocyte count  was 11.3%.  Ferritin was 562.  Iron studies revealed a saturation of 20% and a TIBC of 214 (low).  B12 was 231 (low normal).  Folate was 42.   Peripheral smear revealed rouleaux formation.  Additional testing included the following + studies:  hepatitis C antibody, hepatitis B core antibody, CMV IgM, and EBV VCA (IgM and IgG).  Hepatitis B by PCR was negative.  Hepatitis C RNA was negative.  Mycoplasma pneumonia IgM was negative.  Reticulocyte count was 11.3% (high) indicating appropriate marrow response.  C3 and C4 were normal.  Cold agglutinin titer was negative x 2.  Negative studies included:  ANA, hepatitis B surface antigen, SPEP, and free light chain ratio.   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  MMA was initially 330 (normal).  Repeat MMA was elevated on 08/01/2016 confirming B12 deficiency.  Hepatitis B surface antibody was positive and hepatitis B surface antigen was negative on 08/06/2016.  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 There was no adenopathy. Spleen was normal.  She underwent PTCA and stent placement in right and left common iliac arteries and left external iliac artery on 07/10/2016.  She is on Plavix.  EGD on 03/19/2015 revealed gastritis in the body and antrum. Colonoscopy on 03/19/2015 revealed one hyperplastic polyp. EGD on 07/08/2016 was normal.  No evidence of bleeding.  She has received 6 units of warmed PRBCs to date (last on 08/01/2016). If Rituxan is needed, she requires entecavir 0.5 mg q day beginning 2 weeks prior to Rituxan.  In addition, hepatitis B viral level and LFTs should be checked monthly.  She began steroids (1 mg/kg) on 08/01/2016.  She is currently on prednisone 5 mg every other day.  She is on folic acid 1 mg a day.  She received  B12 1000 mcg IM on 08/06/2016.  She is on oral B12.  B12 was 634 on 10/09/2016  She developed peri-oral and intranasal herpes simplex-1.  She was treated with valacyclovir and doxycycline on 10/19/2016.  She developed a transient elevated alkaline phosphatase on 10/19/2016 which subsequently normalized.  Symptomatically, she feels well.  Eam is unremarkable.  Hematocrit is normal.  Cortisol remains low.  Plan: 1.  Labs today:  CBC with diff. 2.  Discuss plans to continue prednisone until cortisol level is normal. 3.  RTC on Monday early AM for cortisol. 4.  RTC in 1 month for labs (CBC with diff) 5.  RTC in 2 months for MD assess and labs (CBC with diff)  Lequita Asal, MD 12/24/2016, 11:14 AM

## 2016-12-24 NOTE — Progress Notes (Signed)
Patient's BP elevated slightly today.  Saw PCP yesterday who increased Lisinopril to 40 mg daily.  Patient offers no complaints today.

## 2016-12-28 ENCOUNTER — Other Ambulatory Visit: Payer: Self-pay | Admitting: *Deleted

## 2016-12-28 ENCOUNTER — Inpatient Hospital Stay: Payer: BLUE CROSS/BLUE SHIELD

## 2016-12-28 ENCOUNTER — Telehealth: Payer: Self-pay | Admitting: *Deleted

## 2016-12-28 DIAGNOSIS — D649 Anemia, unspecified: Secondary | ICD-10-CM

## 2016-12-28 DIAGNOSIS — D591 Other autoimmune hemolytic anemias: Principal | ICD-10-CM

## 2016-12-28 DIAGNOSIS — D5912 Cold autoimmune hemolytic anemia: Secondary | ICD-10-CM

## 2016-12-28 LAB — CORTISOL: Cortisol, Plasma: 3.9 ug/dL

## 2016-12-28 NOTE — Telephone Encounter (Signed)
-----   Message from Lequita Asal, MD sent at 12/28/2016  3:01 PM EDT ----- Regarding: Please call patient  Cortisol still low.  Please continue prednisone every other day.  Will need to recheck in next 2 weeks.  M  ----- Message ----- From: Interface, Lab In [place] Sent: 12/28/2016  11:02 AM To: Lequita Asal, MD

## 2016-12-28 NOTE — Telephone Encounter (Signed)
Called patient to inform her that her cortisol level is still low.  Per MD continue Prednisone every other day.  Will recheck Cortisol in 2 weeks.  Verbalized understanding.

## 2017-01-04 DIAGNOSIS — R7989 Other specified abnormal findings of blood chemistry: Secondary | ICD-10-CM | POA: Insufficient documentation

## 2017-01-04 DIAGNOSIS — E2740 Unspecified adrenocortical insufficiency: Secondary | ICD-10-CM | POA: Insufficient documentation

## 2017-01-11 ENCOUNTER — Inpatient Hospital Stay: Payer: BLUE CROSS/BLUE SHIELD | Attending: Hematology and Oncology

## 2017-01-11 DIAGNOSIS — Z87891 Personal history of nicotine dependence: Secondary | ICD-10-CM | POA: Insufficient documentation

## 2017-01-11 DIAGNOSIS — D591 Other autoimmune hemolytic anemias: Secondary | ICD-10-CM | POA: Insufficient documentation

## 2017-01-11 DIAGNOSIS — J449 Chronic obstructive pulmonary disease, unspecified: Secondary | ICD-10-CM | POA: Diagnosis not present

## 2017-01-11 DIAGNOSIS — I10 Essential (primary) hypertension: Secondary | ICD-10-CM | POA: Insufficient documentation

## 2017-01-11 DIAGNOSIS — D649 Anemia, unspecified: Secondary | ICD-10-CM

## 2017-01-11 DIAGNOSIS — D890 Polyclonal hypergammaglobulinemia: Secondary | ICD-10-CM | POA: Insufficient documentation

## 2017-01-11 DIAGNOSIS — Z951 Presence of aortocoronary bypass graft: Secondary | ICD-10-CM | POA: Diagnosis not present

## 2017-01-11 DIAGNOSIS — Z7902 Long term (current) use of antithrombotics/antiplatelets: Secondary | ICD-10-CM | POA: Insufficient documentation

## 2017-01-11 DIAGNOSIS — Z79899 Other long term (current) drug therapy: Secondary | ICD-10-CM | POA: Insufficient documentation

## 2017-01-11 DIAGNOSIS — E538 Deficiency of other specified B group vitamins: Secondary | ICD-10-CM | POA: Insufficient documentation

## 2017-01-11 DIAGNOSIS — E785 Hyperlipidemia, unspecified: Secondary | ICD-10-CM | POA: Insufficient documentation

## 2017-01-11 LAB — CORTISOL: Cortisol, Plasma: 3.5 ug/dL

## 2017-01-26 ENCOUNTER — Other Ambulatory Visit: Payer: BLUE CROSS/BLUE SHIELD

## 2017-02-04 ENCOUNTER — Inpatient Hospital Stay: Payer: BLUE CROSS/BLUE SHIELD | Attending: Hematology and Oncology | Admitting: *Deleted

## 2017-02-04 DIAGNOSIS — E785 Hyperlipidemia, unspecified: Secondary | ICD-10-CM | POA: Insufficient documentation

## 2017-02-04 DIAGNOSIS — Z951 Presence of aortocoronary bypass graft: Secondary | ICD-10-CM | POA: Insufficient documentation

## 2017-02-04 DIAGNOSIS — D591 Other autoimmune hemolytic anemias: Secondary | ICD-10-CM | POA: Insufficient documentation

## 2017-02-04 DIAGNOSIS — I10 Essential (primary) hypertension: Secondary | ICD-10-CM | POA: Insufficient documentation

## 2017-02-04 DIAGNOSIS — D5912 Cold autoimmune hemolytic anemia: Secondary | ICD-10-CM

## 2017-02-04 DIAGNOSIS — Z87891 Personal history of nicotine dependence: Secondary | ICD-10-CM | POA: Diagnosis not present

## 2017-02-04 DIAGNOSIS — Z79899 Other long term (current) drug therapy: Secondary | ICD-10-CM | POA: Insufficient documentation

## 2017-02-04 DIAGNOSIS — D890 Polyclonal hypergammaglobulinemia: Secondary | ICD-10-CM | POA: Insufficient documentation

## 2017-02-04 DIAGNOSIS — E538 Deficiency of other specified B group vitamins: Secondary | ICD-10-CM | POA: Diagnosis not present

## 2017-02-04 DIAGNOSIS — J449 Chronic obstructive pulmonary disease, unspecified: Secondary | ICD-10-CM | POA: Diagnosis not present

## 2017-02-04 DIAGNOSIS — Z7902 Long term (current) use of antithrombotics/antiplatelets: Secondary | ICD-10-CM | POA: Diagnosis not present

## 2017-02-04 LAB — COMPREHENSIVE METABOLIC PANEL
ALT: 15 U/L (ref 14–54)
AST: 16 U/L (ref 15–41)
Albumin: 4 g/dL (ref 3.5–5.0)
Alkaline Phosphatase: 83 U/L (ref 38–126)
Anion gap: 7 (ref 5–15)
BUN: 30 mg/dL — ABNORMAL HIGH (ref 6–20)
CO2: 29 mmol/L (ref 22–32)
Calcium: 9.4 mg/dL (ref 8.9–10.3)
Chloride: 107 mmol/L (ref 101–111)
Creatinine, Ser: 1.22 mg/dL — ABNORMAL HIGH (ref 0.44–1.00)
GFR calc Af Amer: 55 mL/min — ABNORMAL LOW (ref >60)
GFR calc non Af Amer: 48 mL/min — ABNORMAL LOW (ref >60)
Glucose, Bld: 92 mg/dL (ref 65–99)
Potassium: 4.3 mmol/L (ref 3.5–5.1)
Sodium: 143 mmol/L (ref 135–145)
Total Bilirubin: 0.5 mg/dL (ref 0.3–1.2)
Total Protein: 7.2 g/dL (ref 6.5–8.1)

## 2017-02-04 LAB — CBC WITH DIFFERENTIAL/PLATELET
Basophils Absolute: 0.1 10*3/uL (ref 0–0.1)
Basophils Relative: 2 %
Eosinophils Absolute: 0.1 10*3/uL (ref 0–0.7)
Eosinophils Relative: 3 %
HCT: 37.6 % (ref 35.0–47.0)
Hemoglobin: 13 g/dL (ref 12.0–16.0)
Lymphocytes Relative: 21 %
Lymphs Abs: 0.9 10*3/uL — ABNORMAL LOW (ref 1.0–3.6)
MCH: 30.5 pg (ref 26.0–34.0)
MCHC: 34.5 g/dL (ref 32.0–36.0)
MCV: 88.6 fL (ref 80.0–100.0)
Monocytes Absolute: 0.4 10*3/uL (ref 0.2–0.9)
Monocytes Relative: 10 %
Neutro Abs: 2.7 10*3/uL (ref 1.4–6.5)
Neutrophils Relative %: 64 %
Platelets: 115 10*3/uL — ABNORMAL LOW (ref 150–440)
RBC: 4.24 MIL/uL (ref 3.80–5.20)
RDW: 13.6 % (ref 11.5–14.5)
WBC: 4.2 10*3/uL (ref 3.6–11.0)

## 2017-02-04 LAB — CORTISOL: Cortisol, Plasma: 5.6 ug/dL

## 2017-02-05 ENCOUNTER — Other Ambulatory Visit: Payer: Self-pay | Admitting: *Deleted

## 2017-02-05 ENCOUNTER — Telehealth: Payer: Self-pay | Admitting: *Deleted

## 2017-02-05 DIAGNOSIS — D649 Anemia, unspecified: Secondary | ICD-10-CM

## 2017-02-05 NOTE — Telephone Encounter (Signed)
Called patient to discuss lab results and the need for another test.  Message left for patient and schedulers to make appt

## 2017-02-09 ENCOUNTER — Other Ambulatory Visit: Payer: BLUE CROSS/BLUE SHIELD

## 2017-02-10 ENCOUNTER — Telehealth: Payer: Self-pay | Admitting: *Deleted

## 2017-02-10 NOTE — Telephone Encounter (Signed)
Tina Page took a call from [facility] that patient recently had labs and a copy is being faxed to [place] office.  Patient has been taken off of her Lisinopril.

## 2017-02-11 ENCOUNTER — Telehealth: Payer: Self-pay | Admitting: *Deleted

## 2017-02-11 DIAGNOSIS — D591 Other autoimmune hemolytic anemias: Principal | ICD-10-CM

## 2017-02-11 DIAGNOSIS — D5912 Cold autoimmune hemolytic anemia: Secondary | ICD-10-CM

## 2017-02-11 NOTE — Telephone Encounter (Signed)
Patient called inquiring about an appt to be made and states her PCP was to call yesterday regarding thinking she is anemic again

## 2017-02-11 NOTE — Telephone Encounter (Signed)
  Tina Page showed me labs with a dramatic increase in creatinine and an markedly elevated TSH.  No CBC.  Creatinine and TSH to be addressed by PCP.  We can draw a CBC if she has not had one recently.  M

## 2017-02-12 ENCOUNTER — Inpatient Hospital Stay: Payer: BLUE CROSS/BLUE SHIELD

## 2017-02-12 ENCOUNTER — Telehealth: Payer: Self-pay | Admitting: *Deleted

## 2017-02-12 DIAGNOSIS — D5912 Cold autoimmune hemolytic anemia: Secondary | ICD-10-CM

## 2017-02-12 DIAGNOSIS — D591 Other autoimmune hemolytic anemias: Secondary | ICD-10-CM | POA: Diagnosis not present

## 2017-02-12 LAB — CBC WITH DIFFERENTIAL/PLATELET
Basophils Absolute: 0.1 10*3/uL (ref 0–0.1)
Basophils Relative: 1 %
Eosinophils Absolute: 0.1 10*3/uL (ref 0–0.7)
Eosinophils Relative: 1 %
HCT: 37.4 % (ref 35.0–47.0)
Hemoglobin: 13.1 g/dL (ref 12.0–16.0)
Lymphocytes Relative: 13 %
Lymphs Abs: 0.7 10*3/uL — ABNORMAL LOW (ref 1.0–3.6)
MCH: 30.1 pg (ref 26.0–34.0)
MCHC: 34.9 g/dL (ref 32.0–36.0)
MCV: 86.2 fL (ref 80.0–100.0)
Monocytes Absolute: 0.5 10*3/uL (ref 0.2–0.9)
Monocytes Relative: 10 %
Neutro Abs: 3.8 10*3/uL (ref 1.4–6.5)
Neutrophils Relative %: 75 %
Platelets: 139 10*3/uL — ABNORMAL LOW (ref 150–440)
RBC: 4.34 MIL/uL (ref 3.80–5.20)
RDW: 13.3 % (ref 11.5–14.5)
WBC: 5.1 10*3/uL (ref 3.6–11.0)

## 2017-02-12 NOTE — Telephone Encounter (Signed)
-----   Message from Lequita Asal, MD sent at 02/12/2017  1:27 PM EDT ----- Regarding: Please call patient  CBC is normal.  M  ----- Message ----- From: Interface, Lab In [place] Sent: 02/12/2017  12:02 PM To: Lequita Asal, MD

## 2017-02-12 NOTE — Telephone Encounter (Signed)
Patient will come in at 1130 for lab only

## 2017-02-12 NOTE — Telephone Encounter (Signed)
Labs were drawn at [facility] and faxed to [name].  Patient also called stating she had been seen there and they felt like she was anemic and needed to be followed by [facility].  However, the lab results do not include a CBC but did show the patient's creatnine and TSH to be abnormal.  I called the patient per VO by Dr. Mike Gip to advise the patient to call back to [facility] to have them address these issues.  Patient verbalized understanding and in agreement, stating she will call them today.

## 2017-02-12 NOTE — Telephone Encounter (Signed)
Called patient to inform her that CBC was normal today.

## 2017-02-25 ENCOUNTER — Other Ambulatory Visit: Payer: Self-pay | Admitting: Hematology and Oncology

## 2017-02-25 DIAGNOSIS — D598 Other acquired hemolytic anemias: Secondary | ICD-10-CM

## 2017-03-09 ENCOUNTER — Encounter: Payer: Self-pay | Admitting: Hematology and Oncology

## 2017-03-09 ENCOUNTER — Inpatient Hospital Stay (HOSPITAL_BASED_OUTPATIENT_CLINIC_OR_DEPARTMENT_OTHER): Payer: BLUE CROSS/BLUE SHIELD | Admitting: Hematology and Oncology

## 2017-03-09 ENCOUNTER — Inpatient Hospital Stay: Payer: BLUE CROSS/BLUE SHIELD | Attending: Hematology and Oncology

## 2017-03-09 VITALS — BP 143/80 | HR 80 | Temp 95.0°F | Resp 18 | Wt 168.1 lb

## 2017-03-09 DIAGNOSIS — D591 Other autoimmune hemolytic anemias: Secondary | ICD-10-CM | POA: Diagnosis not present

## 2017-03-09 DIAGNOSIS — Z7952 Long term (current) use of systemic steroids: Secondary | ICD-10-CM | POA: Diagnosis not present

## 2017-03-09 DIAGNOSIS — Z79899 Other long term (current) drug therapy: Secondary | ICD-10-CM | POA: Insufficient documentation

## 2017-03-09 DIAGNOSIS — L930 Discoid lupus erythematosus: Secondary | ICD-10-CM | POA: Diagnosis not present

## 2017-03-09 DIAGNOSIS — Z87891 Personal history of nicotine dependence: Secondary | ICD-10-CM

## 2017-03-09 DIAGNOSIS — R748 Abnormal levels of other serum enzymes: Secondary | ICD-10-CM | POA: Insufficient documentation

## 2017-03-09 DIAGNOSIS — I10 Essential (primary) hypertension: Secondary | ICD-10-CM | POA: Diagnosis not present

## 2017-03-09 DIAGNOSIS — D5912 Cold autoimmune hemolytic anemia: Secondary | ICD-10-CM

## 2017-03-09 DIAGNOSIS — R7989 Other specified abnormal findings of blood chemistry: Secondary | ICD-10-CM

## 2017-03-09 DIAGNOSIS — R197 Diarrhea, unspecified: Secondary | ICD-10-CM | POA: Diagnosis not present

## 2017-03-09 DIAGNOSIS — B1920 Unspecified viral hepatitis C without hepatic coma: Secondary | ICD-10-CM

## 2017-03-09 DIAGNOSIS — E2740 Unspecified adrenocortical insufficiency: Secondary | ICD-10-CM

## 2017-03-09 DIAGNOSIS — Z955 Presence of coronary angioplasty implant and graft: Secondary | ICD-10-CM | POA: Insufficient documentation

## 2017-03-09 DIAGNOSIS — B1910 Unspecified viral hepatitis B without hepatic coma: Secondary | ICD-10-CM

## 2017-03-09 DIAGNOSIS — Z7902 Long term (current) use of antithrombotics/antiplatelets: Secondary | ICD-10-CM

## 2017-03-09 DIAGNOSIS — D890 Polyclonal hypergammaglobulinemia: Secondary | ICD-10-CM | POA: Insufficient documentation

## 2017-03-09 DIAGNOSIS — E785 Hyperlipidemia, unspecified: Secondary | ICD-10-CM | POA: Diagnosis not present

## 2017-03-09 DIAGNOSIS — E538 Deficiency of other specified B group vitamins: Secondary | ICD-10-CM

## 2017-03-09 DIAGNOSIS — J449 Chronic obstructive pulmonary disease, unspecified: Secondary | ICD-10-CM

## 2017-03-09 LAB — COMPREHENSIVE METABOLIC PANEL WITH GFR
ALT: 17 U/L (ref 14–54)
AST: 17 U/L (ref 15–41)
Albumin: 4.1 g/dL (ref 3.5–5.0)
Alkaline Phosphatase: 90 U/L (ref 38–126)
Anion gap: 10 (ref 5–15)
BUN: 35 mg/dL — ABNORMAL HIGH (ref 6–20)
CO2: 27 mmol/L (ref 22–32)
Calcium: 9.3 mg/dL (ref 8.9–10.3)
Chloride: 102 mmol/L (ref 101–111)
Creatinine, Ser: 1.33 mg/dL — ABNORMAL HIGH (ref 0.44–1.00)
GFR calc Af Amer: 50 mL/min — ABNORMAL LOW
GFR calc non Af Amer: 43 mL/min — ABNORMAL LOW
Glucose, Bld: 87 mg/dL (ref 65–99)
Potassium: 3.8 mmol/L (ref 3.5–5.1)
Sodium: 139 mmol/L (ref 135–145)
Total Bilirubin: 0.6 mg/dL (ref 0.3–1.2)
Total Protein: 7.1 g/dL (ref 6.5–8.1)

## 2017-03-09 LAB — CBC WITH DIFFERENTIAL/PLATELET
Basophils Absolute: 0 10*3/uL (ref 0–0.1)
Basophils Relative: 0 %
Eosinophils Absolute: 0.1 10*3/uL (ref 0–0.7)
Eosinophils Relative: 3 %
HCT: 34.3 % — ABNORMAL LOW (ref 35.0–47.0)
Hemoglobin: 12 g/dL (ref 12.0–16.0)
Lymphocytes Relative: 24 %
Lymphs Abs: 1.1 10*3/uL (ref 1.0–3.6)
MCH: 30.7 pg (ref 26.0–34.0)
MCHC: 35.1 g/dL (ref 32.0–36.0)
MCV: 87.3 fL (ref 80.0–100.0)
Monocytes Absolute: 0.3 10*3/uL (ref 0.2–0.9)
Monocytes Relative: 6 %
Neutro Abs: 3 10*3/uL (ref 1.4–6.5)
Neutrophils Relative %: 67 %
Platelets: 131 10*3/uL — ABNORMAL LOW (ref 150–440)
RBC: 3.92 MIL/uL (ref 3.80–5.20)
RDW: 15 % — ABNORMAL HIGH (ref 11.5–14.5)
WBC: 4.5 10*3/uL (ref 3.6–11.0)

## 2017-03-09 LAB — VITAMIN B12: Vitamin B-12: 563 pg/mL (ref 180–914)

## 2017-03-09 LAB — FOLATE: Folate: 41 ng/mL (ref >5.9)

## 2017-03-09 NOTE — Progress Notes (Signed)
Patient offers no complaints today. 

## 2017-03-09 NOTE — Progress Notes (Signed)
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  HPI:  The patient was last seen in the hematology clinic on 12/24/2016.  At that time, she was doing well.  Exam was unremarkable.  Hematocrit was normal.  Cortisol remained low.  She has continued prednisone 5 mg QOD.  Follow-up CBC on 02/04/2017 and 02/12/2017 was normal.  Hematocrit was 37.4 with a hemoglobin of 13.1 on 02/12/2017.  Cortisol level was 5.6 on 02/04/2017.  Patient was referred to [facility]) for follow up testing on her hepatitis B and C. Patient reports that she has only seen this provider once, and that she is unsure of his plans for future follow up and management.   During the interim, patient reports that she is doing "ok".  Patient has no major complaints of anything today.     Past Medical History:  Diagnosis Date  . Anemia   . COPD (chronic obstructive pulmonary disease) ([place])   . HLD (hyperlipidemia)   . Hypertension     Past Surgical History:  Procedure Laterality Date  . ESOPHAGOGASTRODUODENOSCOPY (EGD) WITH PROPOFOL N/A 07/08/2016   Procedure: ESOPHAGOGASTRODUODENOSCOPY (EGD) WITH PROPOFOL;  Surgeon: Manya Silvas, MD;  Location: [facility] ENDOSCOPY;  Service: Endoscopy;  Laterality: N/A;  . KNEE SURGERY    . PERIPHERAL VASCULAR CATHETERIZATION N/A 07/10/2016   Procedure: Lower Extremity Angiography;  Surgeon: Katha Cabal, MD;  Location: [place] [facility];  Service: Cardiovascular;  Laterality: N/A;  . PERIPHERAL VASCULAR CATHETERIZATION N/A 07/10/2016   Procedure: Abdominal Aortogram w/Lower Extremity;  Surgeon: Katha Cabal, MD;  Location: [place] [facility];  Service: Cardiovascular;  Laterality: N/A;  . PERIPHERAL VASCULAR CATHETERIZATION  07/10/2016   Procedure: Lower Extremity Intervention;  Surgeon: Katha Cabal, MD;  Location: [place] [facility];  Service: Cardiovascular;;    Family History  Problem Relation Age of Onset  . Diabetes Mother   . Hypertension Mother   . Diabetes Maternal Grandfather   . Hypertension Maternal Grandfather     Social History:  reports that she quit smoking about 7 months ago. Her smoking use included Cigarettes. She smoked 0.50 packs per day. She has never used smokeless tobacco. She reports that she does not drink alcohol or use drugs.  The patient works at [employer]  She is exposed to cold temperatures.  She has a daughter, [name] and a daughter named [name].  She is alone today.   Allergies:  Allergies  Allergen Reactions  . Codeine Anaphylaxis    Current Medications: Current Outpatient Prescriptions  Medication Sig Dispense Refill  . albuterol (PROVENTIL HFA;VENTOLIN HFA) 108 (90 Base) MCG/ACT inhaler Inhale into the lungs.    . clopidogrel (PLAVIX) 75 MG tablet Take 1 tablet (75 mg total) by mouth daily. 30 tablet 5  . docusate sodium (COLACE) 100 MG capsule Take 1 capsule (100 mg total) by mouth 2 (two) times daily. 10 capsule 0  . folic acid (FOLVITE) 1 MG tablet TAKE 1 TABLET (1 MG TOTAL) BY MOUTH DAILY. 90 tablet 1  . hydrochlorothiazide (HYDRODIURIL) 25 MG tablet Take 25 mg by mouth daily.    [name] Kitchen levothyroxine (SYNTHROID, LEVOTHROID) 25 MCG tablet Take 25 mcg by mouth daily.    [name] Kitchen lisinopril (PRINIVIL,ZESTRIL) 40 MG tablet Take 40 mg by mouth daily.    [name] Kitchen omeprazole (PRILOSEC) 20 MG  capsule Take 1 capsule (20 mg total) by mouth daily. 60 capsule 0  . pravastatin (PRAVACHOL) 40 MG tablet Take 40 mg by mouth every evening.    . predniSONE (DELTASONE) 20 MG tablet Take 1 tablet by mouth daily (Patient taking differently: 10 mg. Take 1 tablet by mouth daily) 30 tablet 2  . predniSONE (DELTASONE) 5 MG tablet Take 13 tablets (65 mg total) by mouth daily with breakfast. 50 tablet 1  . predniSONE (DELTASONE) 5 MG tablet Take 1 tablet daily, patient also taking a dose  of 60 mg for a total of 65 mg daily (Patient not taking: Reported on 10/02/2016) 30 tablet 0   No current facility-administered medications for this visit.     Review of Systems:  GENERAL: Feels fine. No fevers or sweats.  Weight stable since last visit. PERFORMANCE STATUS (ECOG): 1 HEENT: No visual changes, sore throat, mouth sores or tenderness. Lungs: No shortness of breath or cough. No hemoptysis. Cardiac: No chest pain, palpitations, orthopnea, or PND. GI: Appetite normal.  No nausea, vomiting, diarrhea, constipation, melena or hematochezia. GU: No urgency, frequency, dysuria, or hematuria. Musculoskeletal: No back pain. No joint pain. No muscle tenderness. Extremities: No pain or swelling. Skin: Discoid lupus. No rashes, skin changes or ulcers. Neuro: No headache, weakness, balance or coordination issues. Endocrine: No diabetes, thyroid issues, hot flashes or night sweats. Psych: No mood changes, depression or anxiety. Pain:  No back pain. No joint pain. No muscle aches. Review of systems: All other systems reviewed and found to be negative.  Physical Exam: Blood pressure (!) 143/80, pulse 80, temperature (!) 95 F (35 C), temperature source Tympanic, resp. rate 18, weight 168 lb 1 oz (76.2 kg). GENERAL:Well developed, well nourished, woman sitting comfortably in the exam room in no acute distress. MENTAL STATUS: Alert and oriented to person, place and time. EYES:Glasses.  Blue eyes.  Pupils equal round and reactive to light and accomodation. No conjunctivitis or scleral icterus. CZY:[id number] clear without lesion. Tonguenormal. Mucous membranes moist. RESPIRATORY:Clear to auscultationwithout rales, wheezes or rhonchi. CARDIOVASCULAR:Regular rate andrhythmwithout murmur, rub or gallop. ABDOMEN:Soft, non-tender, with active bowel sounds, and no hepatosplenomegaly. No masses. SKIN: Livedo reticularis across chest.  No petechiae or  ecchymosis. EXTREMITIES: No edema, no skin discoloration or tenderness. No palpable cords. LYMPHNODES: No palpable cervical, supraclavicular, axillary or inguinal adenopathy  NEUROLOGICAL: Unremarkable   Appointment on 03/09/2017  Component Date Value Ref Range Status  . WBC 03/09/2017 4.5  3.6 - 11.0 K/uL Final  . RBC 03/09/2017 3.92  3.80 - 5.20 MIL/uL Final  . Hemoglobin 03/09/2017 12.0  12.0 - 16.0 g/dL Final  . HCT 03/09/2017 34.3* 35.0 - 47.0 % Final  . MCV 03/09/2017 87.3  80.0 - 100.0 fL Final  . MCH 03/09/2017 30.7  26.0 - 34.0 pg Final  . MCHC 03/09/2017 35.1  32.0 - 36.0 g/dL Final  . RDW 03/09/2017 15.0* 11.5 - 14.5 % Final  . Platelets 03/09/2017 131* 150 - 440 K/uL Final  . Neutrophils Relative % 03/09/2017 67  % Final  . Neutro Abs 03/09/2017 3.0  1.4 - 6.5 K/uL Final  . Lymphocytes Relative 03/09/2017 24  % Final  . Lymphs Abs 03/09/2017 1.1  1.0 - 3.6 K/uL Final  . Monocytes Relative 03/09/2017 6  % Final  . Monocytes Absolute 03/09/2017 0.3  0.2 - 0.9 K/uL Final  . Eosinophils Relative 03/09/2017 3  % Final  . Eosinophils Absolute 03/09/2017 0.1  0 - 0.7 K/uL Final  . Basophils  Relative 03/09/2017 0  % Final  . Basophils Absolute 03/09/2017 0.0  0 - 0.1 K/uL Final  . Sodium 03/09/2017 139  135 - 145 mmol/L Final  . Potassium 03/09/2017 3.8  3.5 - 5.1 mmol/L Final  . Chloride 03/09/2017 102  101 - 111 mmol/L Final  . CO2 03/09/2017 27  22 - 32 mmol/L Final  . Glucose, Bld 03/09/2017 87  65 - 99 mg/dL Final  . BUN 03/09/2017 35* 6 - 20 mg/dL Final  . Creatinine, Ser 03/09/2017 1.33* 0.44 - 1.00 mg/dL Final  . Calcium 03/09/2017 9.3  8.9 - 10.3 mg/dL Final  . Total Protein 03/09/2017 7.1  6.5 - 8.1 g/dL Final  . Albumin 03/09/2017 4.1  3.5 - 5.0 g/dL Final  . AST 03/09/2017 17  15 - 41 U/L Final  . ALT 03/09/2017 17  14 - 54 U/L Final  . Alkaline Phosphatase 03/09/2017 90  38 - 126 U/L Final  . Total Bilirubin 03/09/2017 0.6  0.3 - 1.2 mg/dL Final  . GFR calc  non Af Amer 03/09/2017 43* >60 mL/min Final  . GFR calc Af Amer 03/09/2017 50* >60 mL/min Final   Comment: (NOTE) The eGFR has been calculated using the CKD EPI equation. This calculation has not been validated in all clinical situations. eGFR's persistently <60 mL/min signify possible Chronic Kidney Disease.   . 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 One week prior to presentation, she felt like she was coming down with something. She denied any fever, runny nose, sore throat or cough. She had some diarrhea. She denied any new medications or herbal products.  Anemia workup revealed a cold autoantibody (IgG and complement).  Reticulocyte count was 11.3%.  Ferritin was 562.  Iron studies revealed a saturation of 20% and a TIBC of 214 (low).  B12 was 231 (low normal).  Folate was 42.   Peripheral smear revealed rouleaux formation.  Additional testing included the following + studies:  hepatitis C antibody, hepatitis B core antibody, CMV IgM, and EBV VCA (IgM and IgG).  Hepatitis B by PCR was negative.  Hepatitis C RNA was negative.  Mycoplasma pneumonia IgM was negative.  Reticulocyte count was 11.3% (high) indicating appropriate marrow response.  C3 and C4 were normal.  Cold agglutinin titer was negative x 2.  Negative studies included:  ANA, hepatitis B surface antigen, SPEP, and free light chain ratio.   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  MMA was initially 330 (normal).  Repeat MMA was elevated on 08/01/2016 confirming B12 deficiency.  Hepatitis B surface antibody was positive and hepatitis B surface antigen was negative on 08/06/2016.  Chest, abdomen, and pelvis CT angiogram on 07/07/2016 revealed moderate diffuse atherosclerotic vascular disease of the abdominal aorta with severe stenosis  of the left common iliac artery with suspected short segment occlusion. There was no adenopathy. Spleen was normal.  She underwent PTCA and stent placement in right and left common iliac arteries and left external iliac artery on 07/10/2016.  She is on Plavix.  EGD on 03/19/2015 revealed gastritis in the body and antrum. Colonoscopy on 03/19/2015 revealed one hyperplastic polyp. EGD on 07/08/2016 was normal.  No evidence of bleeding.  She has received 6 units of warmed PRBCs to date (last on 08/01/2016). If Rituxan  is needed, she requires entecavir 0.5 mg q day beginning 2 weeks prior to Rituxan.  In addition, hepatitis B viral level and LFTs should be checked monthly.  She began steroids (1 mg/kg) on 08/01/2016.  She is currently on prednisone 5 mg every other day.  She is on folic acid 1 mg a day.  She received B12 1000 mcg IM on 08/06/2016.  She is on oral B12.  B12 was 634 on 10/09/2016  She developed peri-oral and intranasal herpes simplex-1.  She was treated with valacyclovir and doxycycline on 10/19/2016.  She developed a transient elevated alkaline phosphatase on 10/19/2016 which subsequently normalized.  Symptomatically, she feels well.  Exam is unremarkable.  Hemoglobin is normal.  Cortisol has been low.  Plan: 1.  Labs today:  CBC with diff, CMP, B12, folate, AFP. 2.  Discuss plan to recheck cortisol.  Timing of blood draw discussed. 3.  RTC on 03/11/2017 (will need an 08:30 appt) for labs (Cortisol level); RN to call with results and instructions on whether to stop or continue Prednisone.  4.  RTC in 1 month MD assessment and for labs (CBC with diff).   Lequita Asal, MD 03/09/2017, 12:07 PM

## 2017-03-10 LAB — AFP TUMOR MARKER: AFP, Serum, Tumor Marker: 1.7 ng/mL (ref 0.0–8.3)

## 2017-03-11 ENCOUNTER — Inpatient Hospital Stay: Payer: BLUE CROSS/BLUE SHIELD

## 2017-03-11 DIAGNOSIS — R7989 Other specified abnormal findings of blood chemistry: Secondary | ICD-10-CM

## 2017-03-11 DIAGNOSIS — D591 Other autoimmune hemolytic anemias: Secondary | ICD-10-CM | POA: Diagnosis not present

## 2017-03-11 DIAGNOSIS — D5912 Cold autoimmune hemolytic anemia: Secondary | ICD-10-CM

## 2017-03-11 DIAGNOSIS — E538 Deficiency of other specified B group vitamins: Secondary | ICD-10-CM

## 2017-03-11 DIAGNOSIS — B1920 Unspecified viral hepatitis C without hepatic coma: Secondary | ICD-10-CM

## 2017-03-11 DIAGNOSIS — B1910 Unspecified viral hepatitis B without hepatic coma: Secondary | ICD-10-CM

## 2017-03-11 DIAGNOSIS — E2740 Unspecified adrenocortical insufficiency: Secondary | ICD-10-CM

## 2017-03-11 LAB — CORTISOL: Cortisol, Plasma: 8.3 ug/dL

## 2017-03-12 ENCOUNTER — Telehealth: Payer: Self-pay | Admitting: *Deleted

## 2017-03-12 NOTE — Telephone Encounter (Signed)
-----   Message from Lequita Asal, MD sent at 03/11/2017  5:05 PM EDT ----- Regarding: Please call patient  OK to discontinue prednisone  M  ----- Message ----- From: Interface, Lab In [place] Sent: 03/11/2017   2:56 PM To: Lequita Asal, MD

## 2017-03-12 NOTE — Telephone Encounter (Signed)
Called patient to inform her that per MD discontinue prednisone.  Verbalized understanding.

## 2017-04-13 ENCOUNTER — Inpatient Hospital Stay: Payer: BLUE CROSS/BLUE SHIELD | Attending: Hematology and Oncology

## 2017-04-13 ENCOUNTER — Inpatient Hospital Stay (HOSPITAL_BASED_OUTPATIENT_CLINIC_OR_DEPARTMENT_OTHER): Payer: BLUE CROSS/BLUE SHIELD | Admitting: Hematology and Oncology

## 2017-04-13 ENCOUNTER — Encounter: Payer: Self-pay | Admitting: Hematology and Oncology

## 2017-04-13 VITALS — BP 128/72 | HR 81 | Temp 95.6°F | Resp 18 | Wt 172.5 lb

## 2017-04-13 DIAGNOSIS — D5912 Cold autoimmune hemolytic anemia: Secondary | ICD-10-CM

## 2017-04-13 DIAGNOSIS — L930 Discoid lupus erythematosus: Secondary | ICD-10-CM | POA: Insufficient documentation

## 2017-04-13 DIAGNOSIS — D890 Polyclonal hypergammaglobulinemia: Secondary | ICD-10-CM | POA: Diagnosis not present

## 2017-04-13 DIAGNOSIS — E538 Deficiency of other specified B group vitamins: Secondary | ICD-10-CM | POA: Diagnosis not present

## 2017-04-13 DIAGNOSIS — Z79899 Other long term (current) drug therapy: Secondary | ICD-10-CM | POA: Insufficient documentation

## 2017-04-13 DIAGNOSIS — D696 Thrombocytopenia, unspecified: Secondary | ICD-10-CM | POA: Diagnosis not present

## 2017-04-13 DIAGNOSIS — D591 Other autoimmune hemolytic anemias: Secondary | ICD-10-CM | POA: Diagnosis present

## 2017-04-13 DIAGNOSIS — E2740 Unspecified adrenocortical insufficiency: Secondary | ICD-10-CM

## 2017-04-13 DIAGNOSIS — B1910 Unspecified viral hepatitis B without hepatic coma: Secondary | ICD-10-CM

## 2017-04-13 DIAGNOSIS — Z7902 Long term (current) use of antithrombotics/antiplatelets: Secondary | ICD-10-CM | POA: Diagnosis not present

## 2017-04-13 DIAGNOSIS — E785 Hyperlipidemia, unspecified: Secondary | ICD-10-CM | POA: Insufficient documentation

## 2017-04-13 DIAGNOSIS — I10 Essential (primary) hypertension: Secondary | ICD-10-CM | POA: Diagnosis not present

## 2017-04-13 DIAGNOSIS — J449 Chronic obstructive pulmonary disease, unspecified: Secondary | ICD-10-CM | POA: Insufficient documentation

## 2017-04-13 DIAGNOSIS — K2970 Gastritis, unspecified, without bleeding: Secondary | ICD-10-CM | POA: Insufficient documentation

## 2017-04-13 DIAGNOSIS — Z955 Presence of coronary angioplasty implant and graft: Secondary | ICD-10-CM | POA: Insufficient documentation

## 2017-04-13 DIAGNOSIS — B1920 Unspecified viral hepatitis C without hepatic coma: Secondary | ICD-10-CM

## 2017-04-13 DIAGNOSIS — Z87891 Personal history of nicotine dependence: Secondary | ICD-10-CM | POA: Diagnosis not present

## 2017-04-13 DIAGNOSIS — R7989 Other specified abnormal findings of blood chemistry: Secondary | ICD-10-CM

## 2017-04-13 LAB — CBC WITH DIFFERENTIAL/PLATELET
Basophils Absolute: 0.1 10*3/uL (ref 0–0.1)
Basophils Relative: 1 %
Eosinophils Absolute: 0.1 10*3/uL (ref 0–0.7)
Eosinophils Relative: 2 %
HCT: 34.8 % — ABNORMAL LOW (ref 35.0–47.0)
Hemoglobin: 12.1 g/dL (ref 12.0–16.0)
Lymphocytes Relative: 24 %
Lymphs Abs: 1.1 10*3/uL (ref 1.0–3.6)
MCH: 31.2 pg (ref 26.0–34.0)
MCHC: 34.9 g/dL (ref 32.0–36.0)
MCV: 89.6 fL (ref 80.0–100.0)
Monocytes Absolute: 0.4 10*3/uL (ref 0.2–0.9)
Monocytes Relative: 10 %
Neutro Abs: 2.8 10*3/uL (ref 1.4–6.5)
Neutrophils Relative %: 63 %
Platelets: 123 10*3/uL — ABNORMAL LOW (ref 150–440)
RBC: 3.89 MIL/uL (ref 3.80–5.20)
RDW: 14.9 % — ABNORMAL HIGH (ref 11.5–14.5)
WBC: 4.5 10*3/uL (ref 3.6–11.0)

## 2017-04-13 NOTE — Progress Notes (Signed)
Patient  states at times her legs and feet ache.   She states her left leg looks larger.

## 2017-04-13 NOTE — Progress Notes (Signed)
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  HPI:  The patient was last seen in the hematology clinic on 03/09/2017.  At that time, she was doing well.  She was on prednisone 5 mg every other day.  Exam was unremarkable.  Hemoglobin was normal.  Cortisol had been low.  Follow-up cortisol level off prednisone was 8.3 (normal) the following morning.  She stopped prednisone.  During the interim, she notes that her legs and feet ache.  She is on her feet a lot at [employer] walking.  She denies any fatigue or increased shortness of breath.  She denies any change in urine color.   Past Medical History:  Diagnosis Date  . Anemia   . COPD (chronic obstructive pulmonary disease) ([place])   . HLD (hyperlipidemia)   . Hypertension     Past Surgical History:  Procedure Laterality Date  . ESOPHAGOGASTRODUODENOSCOPY (EGD) WITH PROPOFOL N/A 07/08/2016   Procedure: ESOPHAGOGASTRODUODENOSCOPY (EGD) WITH PROPOFOL;  Surgeon: Manya Silvas, MD;  Location: [facility] ENDOSCOPY;  Service: Endoscopy;  Laterality: N/A;  . KNEE SURGERY    . PERIPHERAL VASCULAR CATHETERIZATION N/A 07/10/2016   Procedure: Lower Extremity Angiography;  Surgeon: Katha Cabal, MD;  Location: [place] [facility];  Service: Cardiovascular;  Laterality: N/A;  . PERIPHERAL VASCULAR CATHETERIZATION N/A 07/10/2016   Procedure: Abdominal Aortogram w/Lower Extremity;  Surgeon: Katha Cabal, MD;  Location: [place] [facility];  Service: Cardiovascular;  Laterality: N/A;  . PERIPHERAL VASCULAR CATHETERIZATION  07/10/2016   Procedure: Lower Extremity Intervention;  Surgeon: Katha Cabal, MD;  Location: [place] [facility];  Service: Cardiovascular;;    Family History  Problem Relation Age of Onset  . Diabetes Mother   . Hypertension Mother   . Diabetes Maternal Grandfather   .  Hypertension Maternal Grandfather     Social History:  reports that she quit smoking about 9 months ago. Her smoking use included Cigarettes. She smoked 0.50 packs per day. She has never used smokeless tobacco. She reports that she does not drink alcohol or use drugs.  The patient works at [employer]  She is exposed to cold temperatures.  She has a daughter, [name] and a daughter named [name].  She is alone today.   Allergies:  Allergies  Allergen Reactions  . Codeine Anaphylaxis    Current Medications: Current Outpatient Prescriptions  Medication Sig Dispense Refill  . albuterol (PROVENTIL HFA;VENTOLIN HFA) 108 (90 Base) MCG/ACT inhaler Inhale into the lungs.    . clopidogrel (PLAVIX) 75 MG tablet Take 1 tablet (75 mg total) by mouth daily. 30 tablet 5  . cyanocobalamin 500 MCG tablet Take 500 mcg by mouth daily.    [name] Kitchen docusate sodium (COLACE) 100 MG capsule Take 1 capsule (100 mg total) by mouth 2 (two) times daily. 10 capsule 0  . folic acid (FOLVITE) 1 MG tablet TAKE 1 TABLET (1 MG TOTAL) BY MOUTH DAILY. 90 tablet 1  . hydrochlorothiazide (HYDRODIURIL) 25 MG tablet Take 25 mg by mouth daily.    [name] Kitchen levothyroxine (SYNTHROID, LEVOTHROID) 25 MCG tablet Take 25 mcg by mouth daily.    [name] Kitchen lisinopril (PRINIVIL,ZESTRIL) 40 MG tablet Take 40 mg by mouth daily.    [name] Kitchen omeprazole (PRILOSEC) 20 MG capsule Take 1 capsule (20 mg total) by mouth daily. 60 capsule 0  . potassium chloride (KLOR-CON)  20 MEQ packet Take 20 mEq by mouth daily.    . pravastatin (PRAVACHOL) 40 MG tablet Take 40 mg by mouth every evening.     No current facility-administered medications for this visit.     Review of Systems:  GENERAL: Feels "ok". No fevers or sweats.  Weight up 4 pounds. PERFORMANCE STATUS (ECOG): 1 HEENT: No visual changes, sore throat, mouth sores or tenderness. Lungs: No shortness of breath or cough. No hemoptysis. Cardiac: No chest pain, palpitations, orthopnea, or PND. GI:  Appetite normal.  No nausea, vomiting, diarrhea, constipation, melena or hematochezia. GU: No urgency, frequency, dysuria, or hematuria. Musculoskeletal: Legs and feet ache.  No back pain. No joint pain. No muscle tenderness. Extremities: No pain or swelling. Skin: Discoid lupus. No rashes, skin changes or ulcers. Neuro: No headache, weakness, balance or coordination issues. Endocrine: No diabetes, thyroid issues, hot flashes or night sweats. Psych: No mood changes, depression or anxiety. Pain:  No back pain. No joint pain. No muscle aches. Review of systems: All other systems reviewed and found to be negative.  Physical Exam: Blood pressure 128/72, pulse 81, temperature (!) 95.6 F (35.3 C), temperature source Tympanic, resp. rate 18, weight 172 lb 8 oz (78.2 kg). GENERAL:Well developed, well nourished, woman sitting comfortably in the exam room in no acute distress. MENTAL STATUS: Alert and oriented to person, place and time. EYES:Glasses.  Blue eyes.  Pupils equal round and reactive to light and accomodation. No conjunctivitis or scleral icterus. CNO:[id number] clear without lesion. Tonguenormal. Mucous membranes moist. RESPIRATORY:Clear to auscultationwithout rales, wheezes or rhonchi. CARDIOVASCULAR:Regular rate andrhythmwithout murmur, rub or gallop. ABDOMEN:Soft, non-tender, with active bowel sounds, and no hepatosplenomegaly. No masses. SKIN: Livedo reticularis across chest.  No petechiae or ecchymosis. EXTREMITIES: No edema, no skin discoloration or tenderness. No palpable cords. LYMPHNODES: No palpable cervical, supraclavicular, axillary or inguinal adenopathy  NEUROLOGICAL: Unremarkable   Appointment on 04/13/2017  Component Date Value Ref Range Status  . WBC 04/13/2017 4.5  3.6 - 11.0 K/uL Final  . RBC 04/13/2017 3.89  3.80 - 5.20 MIL/uL Final  . Hemoglobin 04/13/2017 12.1  12.0 - 16.0 g/dL Final  . HCT 04/13/2017 34.8* 35.0 - 47.0 %  Final  . MCV 04/13/2017 89.6  80.0 - 100.0 fL Final  . MCH 04/13/2017 31.2  26.0 - 34.0 pg Final  . MCHC 04/13/2017 34.9  32.0 - 36.0 g/dL Final  . RDW 04/13/2017 14.9* 11.5 - 14.5 % Final  . Platelets 04/13/2017 123* 150 - 440 K/uL Final  . Neutrophils Relative % 04/13/2017 63  % Final  . Neutro Abs 04/13/2017 2.8  1.4 - 6.5 K/uL Final  . Lymphocytes Relative 04/13/2017 24  % Final  . Lymphs Abs 04/13/2017 1.1  1.0 - 3.6 K/uL Final  . Monocytes Relative 04/13/2017 10  % Final  . Monocytes Absolute 04/13/2017 0.4  0.2 - 0.9 K/uL Final  . Eosinophils Relative 04/13/2017 2  % Final  . Eosinophils Absolute 04/13/2017 0.1  0 - 0.7 K/uL Final  . Basophils Relative 04/13/2017 1  % Final  . 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 One week prior to presentation, she felt like she was coming down with something. She denied any fever, runny nose, sore throat or cough. She had some diarrhea. She denied any new medications  or herbal products.  Anemia workup revealed a cold autoantibody (IgG and complement).  Reticulocyte count was 11.3%.  Ferritin was 562.  Iron studies revealed a saturation of 20% and a TIBC of 214 (low).  B12 was 231 (low normal).  Folate was 42.   Peripheral smear revealed rouleaux formation.  Additional testing included the following + studies:  hepatitis C antibody, hepatitis B core antibody, CMV IgM, and EBV VCA (IgM and IgG).  Hepatitis B by PCR was negative.  Hepatitis C RNA was negative.  Mycoplasma pneumonia IgM was negative.  Reticulocyte count was 11.3% (high) indicating appropriate marrow response.  C3 and C4 were normal.  Cold agglutinin titer was negative x 2.  Negative studies included:  ANA, hepatitis B surface antigen, SPEP, and free light chain ratio.   There was a polyclonal gammopathy (IgM,  kappa and lambda typing increased).  MMA was initially 330 (normal).  Repeat MMA was elevated on 08/01/2016 confirming B12 deficiency.  Hepatitis B surface antibody was positive and hepatitis B surface antigen was negative on 08/06/2016.  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 There was no adenopathy. Spleen was normal.  She underwent PTCA and stent placement in right and left common iliac arteries and left external iliac artery on 07/10/2016.  She is on Plavix.  EGD on 03/19/2015 revealed gastritis in the body and antrum. Colonoscopy on 03/19/2015 revealed one hyperplastic polyp. EGD on 07/08/2016 was normal.  No evidence of bleeding.  She has received 6 units of warmed PRBCs to date (last on 08/01/2016). If Rituxan is needed, she requires entecavir 0.5 mg q day beginning 2 weeks prior to Rituxan.  In addition, hepatitis B viral level and LFTs should be checked monthly.  She began steroids (1 mg/kg) on 08/01/2016.  She stopped prednisone on 03/12/2017.  She is on folic acid 1 mg a day.  She received B12 1000 mcg IM on 08/06/2016.  She is on oral B12.  B12 and folate were normal on 03/09/2017.  She developed peri-oral and intranasal herpes simplex-1.  She was treated with valacyclovir and doxycycline on 10/19/2016.  She developed a transient elevated alkaline phosphatase on 10/19/2016 which subsequently normalized.  Symptomatically, she denies any B symptoms.  Exam is unremarkable.  Hemoglobin is 12.1.  Platelet count is 123,000.  Plan: 1.  Labs today:  CBC with diff. 2.  Discuss mild thrombocytopenia of unclear etiology.  Discuss plan to assess liver and spleen. 3.  Abdominal ultrasound: assess liver and spleen. 4.  RTC in 3 months for labs (CBC with diff). 5.  RTC in 6 months for MD assessment and labs  (CBC with diff, CMP, LDH).   Lequita Asal, MD 04/13/2017, 12:00 PM

## 2017-04-15 ENCOUNTER — Ambulatory Visit
Admission: RE | Admit: 2017-04-15 | Discharge: 2017-04-15 | Disposition: A | Discharge status: Home / Self Care | Payer: BLUE CROSS/BLUE SHIELD | Source: Ambulatory Visit | Attending: Hematology and Oncology | Admitting: Hematology and Oncology

## 2017-04-15 DIAGNOSIS — D5912 Cold autoimmune hemolytic anemia: Secondary | ICD-10-CM

## 2017-04-15 DIAGNOSIS — D696 Thrombocytopenia, unspecified: Secondary | ICD-10-CM | POA: Diagnosis present

## 2017-04-15 DIAGNOSIS — D591 Other autoimmune hemolytic anemias: Secondary | ICD-10-CM | POA: Insufficient documentation

## 2017-07-13 ENCOUNTER — Inpatient Hospital Stay: Payer: BLUE CROSS/BLUE SHIELD | Attending: Hematology and Oncology

## 2017-07-13 DIAGNOSIS — K2970 Gastritis, unspecified, without bleeding: Secondary | ICD-10-CM | POA: Diagnosis not present

## 2017-07-13 DIAGNOSIS — Z7902 Long term (current) use of antithrombotics/antiplatelets: Secondary | ICD-10-CM | POA: Diagnosis not present

## 2017-07-13 DIAGNOSIS — J449 Chronic obstructive pulmonary disease, unspecified: Secondary | ICD-10-CM | POA: Diagnosis not present

## 2017-07-13 DIAGNOSIS — Z87891 Personal history of nicotine dependence: Secondary | ICD-10-CM | POA: Diagnosis not present

## 2017-07-13 DIAGNOSIS — I10 Essential (primary) hypertension: Secondary | ICD-10-CM | POA: Insufficient documentation

## 2017-07-13 DIAGNOSIS — D696 Thrombocytopenia, unspecified: Secondary | ICD-10-CM | POA: Diagnosis not present

## 2017-07-13 DIAGNOSIS — E538 Deficiency of other specified B group vitamins: Secondary | ICD-10-CM | POA: Diagnosis not present

## 2017-07-13 DIAGNOSIS — D890 Polyclonal hypergammaglobulinemia: Secondary | ICD-10-CM | POA: Diagnosis not present

## 2017-07-13 DIAGNOSIS — Z955 Presence of coronary angioplasty implant and graft: Secondary | ICD-10-CM | POA: Insufficient documentation

## 2017-07-13 DIAGNOSIS — L930 Discoid lupus erythematosus: Secondary | ICD-10-CM | POA: Insufficient documentation

## 2017-07-13 DIAGNOSIS — D591 Other autoimmune hemolytic anemias: Secondary | ICD-10-CM | POA: Insufficient documentation

## 2017-07-13 DIAGNOSIS — Z79899 Other long term (current) drug therapy: Secondary | ICD-10-CM | POA: Diagnosis not present

## 2017-07-13 DIAGNOSIS — E785 Hyperlipidemia, unspecified: Secondary | ICD-10-CM | POA: Insufficient documentation

## 2017-07-13 DIAGNOSIS — D5912 Cold autoimmune hemolytic anemia: Secondary | ICD-10-CM

## 2017-07-13 LAB — CBC WITH DIFFERENTIAL/PLATELET
Basophils Absolute: 0.1 10*3/uL (ref 0–0.1)
Basophils Relative: 2 %
Eosinophils Absolute: 0.1 10*3/uL (ref 0–0.7)
Eosinophils Relative: 3 %
HCT: 30.9 % — ABNORMAL LOW (ref 35.0–47.0)
Hemoglobin: 10.4 g/dL — ABNORMAL LOW (ref 12.0–16.0)
Lymphocytes Relative: 29 %
Lymphs Abs: 1.3 10*3/uL (ref 1.0–3.6)
MCH: 31.7 pg (ref 26.0–34.0)
MCHC: 33.7 g/dL (ref 32.0–36.0)
MCV: 93.9 fL (ref 80.0–100.0)
Monocytes Absolute: 0.4 10*3/uL (ref 0.2–0.9)
Monocytes Relative: 9 %
Neutro Abs: 2.6 10*3/uL (ref 1.4–6.5)
Neutrophils Relative %: 57 %
Platelets: 142 10*3/uL — ABNORMAL LOW (ref 150–440)
RBC: 3.29 MIL/uL — ABNORMAL LOW (ref 3.80–5.20)
RDW: 15.7 % — ABNORMAL HIGH (ref 11.5–14.5)
WBC: 4.6 10*3/uL (ref 3.6–11.0)

## 2017-09-06 ENCOUNTER — Other Ambulatory Visit: Payer: Self-pay | Admitting: Hematology and Oncology

## 2017-09-06 DIAGNOSIS — D598 Other acquired hemolytic anemias: Secondary | ICD-10-CM

## 2017-10-12 ENCOUNTER — Other Ambulatory Visit: Payer: Self-pay | Admitting: Hematology and Oncology

## 2017-10-12 ENCOUNTER — Inpatient Hospital Stay (HOSPITAL_BASED_OUTPATIENT_CLINIC_OR_DEPARTMENT_OTHER): Payer: BLUE CROSS/BLUE SHIELD | Admitting: Hematology and Oncology

## 2017-10-12 ENCOUNTER — Inpatient Hospital Stay: Payer: BLUE CROSS/BLUE SHIELD | Attending: Hematology and Oncology

## 2017-10-12 ENCOUNTER — Encounter: Payer: Self-pay | Admitting: Hematology and Oncology

## 2017-10-12 VITALS — BP 129/80 | HR 73 | Temp 95.8°F | Wt 171.2 lb

## 2017-10-12 DIAGNOSIS — R197 Diarrhea, unspecified: Secondary | ICD-10-CM | POA: Insufficient documentation

## 2017-10-12 DIAGNOSIS — D5912 Cold autoimmune hemolytic anemia: Secondary | ICD-10-CM

## 2017-10-12 DIAGNOSIS — Z87891 Personal history of nicotine dependence: Secondary | ICD-10-CM | POA: Insufficient documentation

## 2017-10-12 DIAGNOSIS — E538 Deficiency of other specified B group vitamins: Secondary | ICD-10-CM | POA: Diagnosis not present

## 2017-10-12 DIAGNOSIS — D890 Polyclonal hypergammaglobulinemia: Secondary | ICD-10-CM | POA: Diagnosis not present

## 2017-10-12 DIAGNOSIS — J449 Chronic obstructive pulmonary disease, unspecified: Secondary | ICD-10-CM | POA: Insufficient documentation

## 2017-10-12 DIAGNOSIS — D649 Anemia, unspecified: Secondary | ICD-10-CM

## 2017-10-12 DIAGNOSIS — D591 Other autoimmune hemolytic anemias: Secondary | ICD-10-CM

## 2017-10-12 DIAGNOSIS — D696 Thrombocytopenia, unspecified: Secondary | ICD-10-CM

## 2017-10-12 LAB — CBC WITH DIFFERENTIAL/PLATELET
Basophils Absolute: 0.1 10*3/uL (ref 0–0.1)
Basophils Relative: 2 %
Eosinophils Absolute: 0.2 10*3/uL (ref 0–0.7)
Eosinophils Relative: 3 %
HCT: 33.5 % — ABNORMAL LOW (ref 35.0–47.0)
Hemoglobin: 11.3 g/dL — ABNORMAL LOW (ref 12.0–16.0)
Lymphocytes Relative: 28 %
Lymphs Abs: 1.3 10*3/uL (ref 1.0–3.6)
MCH: 31.7 pg (ref 26.0–34.0)
MCHC: 33.6 g/dL (ref 32.0–36.0)
MCV: 94.2 fL (ref 80.0–100.0)
Monocytes Absolute: 0.4 10*3/uL (ref 0.2–0.9)
Monocytes Relative: 8 %
Neutro Abs: 2.7 10*3/uL (ref 1.4–6.5)
Neutrophils Relative %: 59 %
Platelets: 147 10*3/uL — ABNORMAL LOW (ref 150–440)
RBC: 3.56 MIL/uL — ABNORMAL LOW (ref 3.80–5.20)
RDW: 13.5 % (ref 11.5–14.5)
WBC: 4.6 10*3/uL (ref 3.6–11.0)

## 2017-10-12 LAB — RETICULOCYTES
RBC.: 3.55 MIL/uL — ABNORMAL LOW (ref 3.80–5.20)
Retic Count, Absolute: 142 10*3/uL (ref 19.0–183.0)
Retic Ct Pct: 4 % — ABNORMAL HIGH (ref 0.4–3.1)

## 2017-10-12 LAB — FERRITIN: Ferritin: 528 ng/mL — ABNORMAL HIGH (ref 11–307)

## 2017-10-12 LAB — LACTATE DEHYDROGENASE: LDH: 192 U/L (ref 98–192)

## 2017-10-12 LAB — COMPREHENSIVE METABOLIC PANEL
ALT: 14 U/L (ref 14–54)
AST: 19 U/L (ref 15–41)
Albumin: 4.2 g/dL (ref 3.5–5.0)
Alkaline Phosphatase: 92 U/L (ref 38–126)
Anion gap: 6 (ref 5–15)
BUN: 29 mg/dL — ABNORMAL HIGH (ref 6–20)
CO2: 28 mmol/L (ref 22–32)
Calcium: 9.1 mg/dL (ref 8.9–10.3)
Chloride: 104 mmol/L (ref 101–111)
Creatinine, Ser: 1.24 mg/dL — ABNORMAL HIGH (ref 0.44–1.00)
GFR calc Af Amer: 54 mL/min — ABNORMAL LOW (ref >60)
GFR calc non Af Amer: 47 mL/min — ABNORMAL LOW (ref >60)
Glucose, Bld: 92 mg/dL (ref 65–99)
Potassium: 4.4 mmol/L (ref 3.5–5.1)
Sodium: 138 mmol/L (ref 135–145)
Total Bilirubin: 0.9 mg/dL (ref 0.3–1.2)
Total Protein: 7.6 g/dL (ref 6.5–8.1)

## 2017-10-12 LAB — VITAMIN B12: Vitamin B-12: 827 pg/mL (ref 180–914)

## 2017-10-12 LAB — FOLATE: Folate: >100 ng/mL (ref >5.9)

## 2017-10-12 LAB — DAT, POLYSPECIFIC AHG (ARMC ONLY)
DAT, IgG: POSITIVE
DAT, complement: POSITIVE
Polyspecific AHG test: POSITIVE

## 2017-10-12 NOTE — Progress Notes (Signed)
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  HPI:  The patient was last seen in the hematology clinic on 04/13/2017.  At that time, she denied any B symptoms.  Exam was unremarkable.  Hemoglobin was 12.1.  Platelet count was 123,000.  Abdominal ultrasound on 04/15/2017 revealed a normal spleen, sludge in the gallbladder, and no biliary distention.  The liver was echogenic consistent fatty infiltration and/or hepatocellular disease.  CBC on 07/13/2017 revealed a hematocrit of 30.9, hemoglobin 10.4, MCV 93.9, platelets 142,000, WBC 4600 with an ANC of 2600.  Symptomatically, patient is doing well. She notes that her energy is "ok".  She denies denies bleeding, and has not appreciated any gross hematuria. Her diet remains good. Weight is down 1 pound since her last visit.  She denies pain in the clinic today.    Past Medical History:  Diagnosis Date  . Anemia   . COPD (chronic obstructive pulmonary disease) ([place])   . HLD (hyperlipidemia)   . Hypertension     Past Surgical History:  Procedure Laterality Date  . ESOPHAGOGASTRODUODENOSCOPY (EGD) WITH PROPOFOL N/A 07/08/2016   Procedure: ESOPHAGOGASTRODUODENOSCOPY (EGD) WITH PROPOFOL;  Surgeon: Manya Silvas, MD;  Location: [facility] ENDOSCOPY;  Service: Endoscopy;  Laterality: N/A;  . KNEE SURGERY    . PERIPHERAL VASCULAR CATHETERIZATION N/A 07/10/2016   Procedure: Lower Extremity Angiography;  Surgeon: Katha Cabal, MD;  Location: [place] [facility];  Service: Cardiovascular;  Laterality: N/A;  . PERIPHERAL VASCULAR CATHETERIZATION N/A 07/10/2016   Procedure: Abdominal Aortogram w/Lower Extremity;  Surgeon: Katha Cabal, MD;  Location: [place] [facility];  Service: Cardiovascular;  Laterality: N/A;  . PERIPHERAL VASCULAR CATHETERIZATION  07/10/2016   Procedure: Lower Extremity  Intervention;  Surgeon: Katha Cabal, MD;  Location: [place] [facility];  Service: Cardiovascular;;    Family History  Problem Relation Age of Onset  . Diabetes Mother   . Hypertension Mother   . Diabetes Maternal Grandfather   . Hypertension Maternal Grandfather     Social History:  reports that she quit smoking about 15 months ago. Her smoking use included cigarettes. She smoked 0.50 packs per day. she has never used smokeless tobacco. She reports that she does not drink alcohol or use drugs.  The patient works at [employer]  She is exposed to cold temperatures.  She works 3rd shift.  She has a daughter, Tina Page and a daughter named Tina Page.  She is alone today.   Allergies:  Allergies  Allergen Reactions  . Codeine Anaphylaxis    Current Medications: Current Outpatient Medications  Medication Sig Dispense Refill  . albuterol (PROVENTIL HFA;VENTOLIN HFA) 108 (90 Base) MCG/ACT inhaler Inhale into the lungs.    . clopidogrel (PLAVIX) 75 MG tablet Take 1 tablet (75 mg total) by mouth daily. 30 tablet 5  . cyanocobalamin 500 MCG tablet Take 500 mcg by mouth daily.    [name] Kitchen docusate sodium (COLACE) 100 MG capsule Take 1 capsule (100 mg total) by mouth 2 (two) times daily. 10 capsule 0  . folic acid (FOLVITE) 1 MG tablet TAKE 1 TABLET (1 MG TOTAL) BY MOUTH DAILY. 90 tablet 1  . hydrochlorothiazide (HYDRODIURIL) 25 MG tablet Take 25 mg by mouth daily.    [name] Kitchen levothyroxine (SYNTHROID, LEVOTHROID) 25 MCG tablet Take 25 mcg by mouth daily.    [name] Kitchen lisinopril (  PRINIVIL,ZESTRIL) 40 MG tablet Take 40 mg by mouth daily.    [name] Kitchen omeprazole (PRILOSEC) 20 MG capsule Take 1 capsule (20 mg total) by mouth daily. 60 capsule 0  . potassium chloride (KLOR-CON) 20 MEQ packet Take 20 mEq by mouth daily.    . pravastatin (PRAVACHOL) 40 MG tablet Take 40 mg by mouth every evening.     No current facility-administered medications for this visit.     Review of Systems:  GENERAL: Feels  "alright". No fevers or sweats.  Weight down 1 pound. PERFORMANCE STATUS (ECOG): 1 HEENT: No visual changes, sore throat, mouth sores or tenderness. Lungs: No shortness of breath or cough. No hemoptysis. Cardiac: No chest pain, palpitations, orthopnea, or PND. GI: Appetite normal.  No nausea, vomiting, diarrhea, constipation, melena or hematochezia. GU: No urgency, frequency, dysuria, or hematuria. Musculoskeletal: No back pain. No joint pain. No muscle tenderness. Extremities: No pain or swelling. Skin: Discoid lupus. No rashes, skin changes or ulcers. Neuro: No headache, weakness, balance or coordination issues. Endocrine: No diabetes, thyroid issues, hot flashes or night sweats. Psych: No mood changes, depression or anxiety. Pain:  No back pain. No joint pain. No muscle aches. Review of systems: All other systems reviewed and found to be negative.  Physical Exam: Blood pressure 129/80, pulse 73, temperature (!) 95.8 F (35.4 C), temperature source Tympanic, weight 171 lb 3 oz (77.7 kg). GENERAL:Well developed, well nourished, woman sitting comfortably in the exam room in no acute distress. MENTAL STATUS: Alert and oriented to person, place and time. EYES:Glasses.  Blue eyes.  Pupils equal round and reactive to light and accomodation. No conjunctivitis or scleral icterus. MOQ:[id number] clear without lesion. Tonguenormal. Mucous membranes moist. RESPIRATORY:Clear to auscultationwithout rales, wheezes or rhonchi. CARDIOVASCULAR:Regular rate andrhythmwithout murmur, rub or gallop. ABDOMEN:Soft, non-tender, with active bowel sounds, and no hepatosplenomegaly. No masses. SKIN: No petechiae or ecchymosis. EXTREMITIES: No edema, no skin discoloration or tenderness. No palpable cords. LYMPHNODES: No palpable cervical, supraclavicular, axillary or inguinal adenopathy  NEUROLOGICAL: Unremarkable   Appointment on 10/12/2017  Component Date Value Ref  Range Status  . Retic Ct Pct 10/12/2017 PENDING  0.4 - 3.1 % Incomplete  . RBC. 10/12/2017 PENDING  3.87 - 5.11 MIL/uL Incomplete  . Retic Count, Absolute 10/12/2017 PENDING  19.0 - 186.0 K/uL Incomplete  . LDH 10/12/2017 192  98 - 192 U/L Final   Performed at [facility], [address] [place], [place] [address]  . Sodium 10/12/2017 138  135 - 145 mmol/L Final  . Potassium 10/12/2017 4.4  3.5 - 5.1 mmol/L Final  . Chloride 10/12/2017 104  101 - 111 mmol/L Final  . CO2 10/12/2017 28  22 - 32 mmol/L Final  . Glucose, Bld 10/12/2017 92  65 - 99 mg/dL Final  . BUN 10/12/2017 29* 6 - 20 mg/dL Final  . Creatinine, Ser 10/12/2017 1.24* 0.44 - 1.00 mg/dL Final  . Calcium 10/12/2017 9.1  8.9 - 10.3 mg/dL Final  . Total Protein 10/12/2017 7.6  6.5 - 8.1 g/dL Final  . Albumin 10/12/2017 4.2  3.5 - 5.0 g/dL Final  . AST 10/12/2017 19  15 - 41 U/L Final  . ALT 10/12/2017 14  14 - 54 U/L Final  . Alkaline Phosphatase 10/12/2017 92  38 - 126 U/L Final  . Total Bilirubin 10/12/2017 0.9  0.3 - 1.2 mg/dL Final  . GFR calc non Af Amer 10/12/2017 47* >60 mL/min Final  . GFR calc Af Amer 10/12/2017 54* >60 mL/min Final  Comment: (NOTE) The eGFR has been calculated using the CKD EPI equation. This calculation has not been validated in all clinical situations. eGFR's persistently <60 mL/min signify possible Chronic Kidney Disease.   Georgiann Hahn gap 10/12/2017 6  5 - 15 Final   Performed at [facility], [address] [place], [place] [address]  . WBC 10/12/2017 4.6  3.6 - 11.0 K/uL Final  . RBC 10/12/2017 3.56* 3.80 - 5.20 MIL/uL Final  . Hemoglobin 10/12/2017 11.3* 12.0 - 16.0 g/dL Final  . HCT 10/12/2017 33.5* 35.0 - 47.0 % Final  . MCV 10/12/2017 94.2  80.0 - 100.0 fL Final  . MCH 10/12/2017 31.7  26.0 - 34.0 pg Final  . MCHC 10/12/2017 33.6  32.0 - 36.0 g/dL Final  . RDW 10/12/2017 13.5  11.5 - 14.5 % Final  . Platelets 10/12/2017 147* 150 - 440 K/uL Final  . Neutrophils Relative  % 10/12/2017 59  % Final  . Neutro Abs 10/12/2017 2.7  1.4 - 6.5 K/uL Final  . Lymphocytes Relative 10/12/2017 28  % Final  . Lymphs Abs 10/12/2017 1.3  1.0 - 3.6 K/uL Final  . Monocytes Relative 10/12/2017 8  % Final  . Monocytes Absolute 10/12/2017 0.4  0.2 - 0.9 K/uL Final  . Eosinophils Relative 10/12/2017 3  % Final  . Eosinophils Absolute 10/12/2017 0.2  0 - 0.7 K/uL Final  . Basophils Relative 10/12/2017 2  % Final  . 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 One week prior to presentation, she felt like she was coming down with something. She denied any fever, runny nose, sore throat or cough. She had some diarrhea. She denied any new medications or herbal products.  Anemia workup revealed a cold autoantibody (IgG and complement).  Reticulocyte count was 11.3%.  Ferritin was 562.  Iron studies revealed a saturation of 20% and a TIBC of 214 (low).  B12 was 231 (low normal).  Folate was 42.   Peripheral smear revealed rouleaux formation.  Additional testing included the following + studies:  hepatitis C antibody, hepatitis B core antibody, CMV IgM, and EBV VCA (IgM and IgG).  Hepatitis B by PCR was negative.  Hepatitis C RNA was negative.  Mycoplasma pneumonia IgM was negative.  Reticulocyte count was 11.3% (high) indicating appropriate marrow response.  C3 and C4 were normal.  Cold agglutinin titer was negative x 2.  Negative studies included:  ANA, hepatitis B surface antigen, SPEP, and free light chain ratio.   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  MMA was initially 330 (normal).  Repeat MMA was elevated on 08/01/2016 confirming B12 deficiency.  Hepatitis B surface antibody was positive and  hepatitis B surface antigen was negative on 08/06/2016.  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 There was no adenopathy. Spleen was normal.  Abdominal ultrasound on 04/15/2017 revealed a normal spleen and sludge in the gallbladder.  The liver was echogenic consistent fatty infiltration and/or hepatocellular disease.  She underwent PTCA and stent placement in right and left common iliac arteries and left  external iliac artery on 07/10/2016.  She is on Plavix.  EGD on 03/19/2015 revealed gastritis in the body and antrum. Colonoscopy on 03/19/2015 revealed one hyperplastic polyp. EGD on 07/08/2016 was normal.  No evidence of bleeding.  She has received 6 units of warmed PRBCs to date (last on 08/01/2016). If Rituxan is needed, she requires entecavir 0.5 mg q day beginning 2 weeks prior to Rituxan.  In addition, hepatitis B viral level and LFTs should be checked monthly.  She began steroids (1 mg/kg) on 08/01/2016.  She stopped prednisone on 03/12/2017.  She is on folic acid 1 mg a day.  She received B12 1000 mcg IM on 08/06/2016.  She is on oral B12.  B12 and folate were normal on 03/09/2017.  She developed peri-oral and intranasal herpes simplex-1.  She was treated with valacyclovir and doxycycline on 10/19/2016.  She developed a transient elevated alkaline phosphatase on 10/19/2016 which subsequently normalized.  Symptomatically, she denies any B symptoms.  Exam is unremarkable.  Hemoglobin is 11.3.  Platelet count is 147,000. Creatinine is 1.24.  LDH and bilirubin are normal.  Plan: 1.  Labs today:  CBC with diff, CMP, LDH, ferritin, B12, folate, DAT, retic. 2.  Review interval abdominal ultrasound- no splenomegaly. 3.  RTC in 6 months for labs (CBC with diff). 4.  RTC in 1 year for MD assessment and labs  (CBC with diff, CMP, LDH).  Addendum:  Labs returned after visit.  DAT was positive for a warm autoantibody.  Result confirmed with blood bank.  Hematocrit up since last check.  LDH is normal.  Will repeat labs in 2 weeks.   Honor Loh, NP 10/12/2017, 11:47 AM   I saw and evaluated the patient, participating in the key portions of the service and reviewing pertinent diagnostic studies and records.  I reviewed the nurse practitioner's note and agree with the findings and the plan.  The assessment and plan were discussed with the patient.  A few questions were asked by the patient and answered.   Nolon Stalls, MD 10/12/2017,11:47 AM

## 2017-10-12 NOTE — Progress Notes (Signed)
Patient offers no complaints today. 

## 2017-11-01 ENCOUNTER — Other Ambulatory Visit: Payer: Self-pay | Admitting: Urgent Care

## 2017-11-01 DIAGNOSIS — D599 Acquired hemolytic anemia, unspecified: Secondary | ICD-10-CM

## 2017-11-01 DIAGNOSIS — D696 Thrombocytopenia, unspecified: Secondary | ICD-10-CM

## 2017-11-02 ENCOUNTER — Telehealth: Payer: Self-pay | Admitting: *Deleted

## 2017-11-02 ENCOUNTER — Other Ambulatory Visit: Payer: Self-pay | Admitting: Hematology and Oncology

## 2017-11-02 ENCOUNTER — Inpatient Hospital Stay: Payer: BLUE CROSS/BLUE SHIELD | Attending: Hematology and Oncology

## 2017-11-02 ENCOUNTER — Other Ambulatory Visit: Payer: Self-pay | Admitting: Urgent Care

## 2017-11-02 DIAGNOSIS — B1920 Unspecified viral hepatitis C without hepatic coma: Secondary | ICD-10-CM | POA: Insufficient documentation

## 2017-11-02 DIAGNOSIS — B1910 Unspecified viral hepatitis B without hepatic coma: Secondary | ICD-10-CM

## 2017-11-02 DIAGNOSIS — D599 Acquired hemolytic anemia, unspecified: Secondary | ICD-10-CM

## 2017-11-02 DIAGNOSIS — D591 Other autoimmune hemolytic anemias: Secondary | ICD-10-CM | POA: Insufficient documentation

## 2017-11-02 DIAGNOSIS — D696 Thrombocytopenia, unspecified: Secondary | ICD-10-CM

## 2017-11-02 DIAGNOSIS — R7989 Other specified abnormal findings of blood chemistry: Secondary | ICD-10-CM

## 2017-11-02 DIAGNOSIS — R918 Other nonspecific abnormal finding of lung field: Secondary | ICD-10-CM | POA: Diagnosis not present

## 2017-11-02 DIAGNOSIS — L930 Discoid lupus erythematosus: Secondary | ICD-10-CM | POA: Insufficient documentation

## 2017-11-02 DIAGNOSIS — E790 Hyperuricemia without signs of inflammatory arthritis and tophaceous disease: Secondary | ICD-10-CM | POA: Diagnosis not present

## 2017-11-02 DIAGNOSIS — R945 Abnormal results of liver function studies: Principal | ICD-10-CM

## 2017-11-02 DIAGNOSIS — D5912 Cold autoimmune hemolytic anemia: Secondary | ICD-10-CM

## 2017-11-02 LAB — CBC WITH DIFFERENTIAL/PLATELET
BASOS PCT: 2 %
Basophils Absolute: 0.1 10*3/uL (ref 0–0.1)
Eosinophils Absolute: 0.2 10*3/uL (ref 0–0.7)
Eosinophils Relative: 3 %
HEMATOCRIT: 28.3 % — AB (ref 35.0–47.0)
HEMOGLOBIN: 9.5 g/dL — AB (ref 12.0–16.0)
LYMPHS PCT: 22 %
Lymphs Abs: 1.1 10*3/uL (ref 1.0–3.6)
MCH: 32.6 pg (ref 26.0–34.0)
MCHC: 33.7 g/dL (ref 32.0–36.0)
MCV: 96.5 fL (ref 80.0–100.0)
Monocytes Absolute: 0.3 10*3/uL (ref 0.2–0.9)
Monocytes Relative: 6 %
NEUTROS ABS: 3.2 10*3/uL (ref 1.4–6.5)
NEUTROS PCT: 67 %
Platelets: 163 10*3/uL (ref 150–440)
RBC: 2.93 MIL/uL — ABNORMAL LOW (ref 3.80–5.20)
RDW: 14.5 % (ref 11.5–14.5)
WBC: 4.8 10*3/uL (ref 3.6–11.0)

## 2017-11-02 LAB — RETICULOCYTES
RBC.: 3.1 MIL/uL — ABNORMAL LOW (ref 3.80–5.20)
RETIC COUNT ABSOLUTE: 192.2 10*3/uL — AB (ref 19.0–183.0)
RETIC CT PCT: 6.2 % — AB (ref 0.4–3.1)

## 2017-11-02 LAB — DAT, POLYSPECIFIC AHG (ARMC ONLY)
DAT, IGG: POSITIVE
DAT, complement: POSITIVE
Polyspecific AHG test: POSITIVE

## 2017-11-02 LAB — COMPREHENSIVE METABOLIC PANEL
ALK PHOS: 350 U/L — AB (ref 38–126)
ALT: 213 U/L — AB (ref 14–54)
ANION GAP: 6 (ref 5–15)
AST: 103 U/L — ABNORMAL HIGH (ref 15–41)
Albumin: 4.1 g/dL (ref 3.5–5.0)
BILIRUBIN TOTAL: 1 mg/dL (ref 0.3–1.2)
BUN: 33 mg/dL — ABNORMAL HIGH (ref 6–20)
CALCIUM: 9.3 mg/dL (ref 8.9–10.3)
CO2: 25 mmol/L (ref 22–32)
CREATININE: 1.27 mg/dL — AB (ref 0.44–1.00)
Chloride: 106 mmol/L (ref 101–111)
GFR, EST AFRICAN AMERICAN: 52 mL/min — AB (ref >60)
GFR, EST NON AFRICAN AMERICAN: 45 mL/min — AB (ref >60)
Glucose, Bld: 86 mg/dL (ref 65–99)
Potassium: 4.5 mmol/L (ref 3.5–5.1)
SODIUM: 137 mmol/L (ref 135–145)
TOTAL PROTEIN: 7.7 g/dL (ref 6.5–8.1)

## 2017-11-02 LAB — LACTATE DEHYDROGENASE: LDH: 232 U/L — ABNORMAL HIGH (ref 98–192)

## 2017-11-02 NOTE — Progress Notes (Signed)
[facility] day:  11/03/2017  Chief Complaint: Tina Page is a 62 y.o. female with a h/o cold hemolytic anemia and previous history of hepatitis B and C who presents for reassessment of newly diagnosed warm autoimmune hemolytic anemia with concurrent elevated liver function tests.  HPI:  The patient was last seen in the hematology clinic on 10/12/2017.  At that time, patient was doing well.  She noted that her energy was "okay".  She denied any bruising or bleeding.  Exam was normal.  CBC revealed a hemoglobin 11.3, hematocrit 33.5, and platelets 147,000.  BUN was 29 with a creatinine of 1.24.  LDH was normal at 192.  B12 was normal at 827.  Folate > 100.  Reticulocytes 4%.  Ferritin was elevated at 528.  DAT was positive for a warm autoantibody.    As the patient's hematocrit had improved and her LDH and bilirubin were normal, repeat labs were ordered in 2 weeks secondary to the unexpected + warm autoantibody.   Repeat lab testing on 11/02/2017 revealed that her hemoglobin had decreased to 9.5 (previously 11.3).  Hematocrit down to 28.3 (previously 33.5).  LDH had increased to 232 (previously 192).  Reticulocyte count 6.2%.  Renal function was stable; BUN 33 with a creatinine of 1.27.    There was marked elevation in patient's LFTs noted.  AST 103 (previously 19), ALT 213 (previously 14), alkaline phosphatase 350 (previously 92).  Patient's total bilirubin remained normal at 1.0.  DAT remained positive for a warm autoantibody.  She was contacted for immediate follow-up.  Symptomatically, patient developed flu like symptoms on 10/26/2017. She felt "bad".  She had cough, myalgias, and fever (tmax unknown). Patient presented to PCP and was tested for influenza. Testing was positive. Patient was prescribed Mucinex, Tamiflu, and amoxicillin.  She only took amoxicillin x 5 days.  Symptoms resolved with the treatments. Patient also sustained a fall last week.  She fell out of a chair and bruised her RIGHT hip.  Patient denies fevers. She is eating well. Her weight remains stable. Patient denies any abdominal pain. Patient denies the use of any herbal products.  She denies the excess use of acetaminophen containing products or NSAIDs.  Patient does not drink alcohol.   Past Medical History:  Diagnosis Date  . Anemia   . COPD (chronic obstructive pulmonary disease) ([place])   . HLD (hyperlipidemia)   . Hypertension     Past Surgical History:  Procedure Laterality Date  . ESOPHAGOGASTRODUODENOSCOPY (EGD) WITH PROPOFOL N/A 07/08/2016   Procedure: ESOPHAGOGASTRODUODENOSCOPY (EGD) WITH PROPOFOL;  Surgeon: Manya Silvas, MD;  Location: [facility] END[facility]COPY;  Service: Endoscopy;  Laterality: N/A;  . KNEE SURGERY    . PERIPHERAL VASCULAR CATHETERIZATION N/A 07/10/2016   Procedure: Lower Extremity Angiography;  Surgeon: Katha Cabal, MD;  Location: [facility];  Service: Cardiovascular;  Laterality: N/A;  . PERIPHERAL VASCULAR CATHETERIZATION N/A 07/10/2016   Procedure: Abdominal Aortogram w/Lower Extremity;  Surgeon: Katha Cabal, MD;  Location: [facility];  Service: Cardiovascular;  Laterality: N/A;  . PERIPHERAL VASCULAR CATHETERIZATION  07/10/2016   Procedure: Lower Extremity Intervention;  Surgeon: Katha Cabal, MD;  Location: [facility];  Service: Cardiovascular;;    Family History  Problem Relation Age of Onset  . Diabetes Mother   . Hypertension Mother   . Diabetes Maternal Grandfather   . Hypertension Maternal Grandfather     Social History:  reports that she  quit smoking about 15 months ago. Her smoking use included cigarettes. She smoked 0.50 packs per day. she has never used smokeless tobacco. She reports that she does not drink alcohol or use drugs.  The patient works at [employer]  She is exposed to cold temperatures.  She works 3rd shift.  She has a daughter, [name] and a daughter  named [name].  She is alone today.   Allergies:  Allergies  Allergen Reactions  . Codeine Anaphylaxis    Current Medications: Current Outpatient Medications  Medication Sig Dispense Refill  . albuterol (PROVENTIL HFA;VENTOLIN HFA) 108 (90 Base) MCG/ACT inhaler Inhale into the lungs.    . clopidogrel (PLAVIX) 75 MG tablet Take 1 tablet (75 mg total) by mouth daily. 30 tablet 5  . cyanocobalamin 500 MCG tablet Take 500 mcg by mouth daily.    [name] Kitchen docusate sodium (COLACE) 100 MG capsule Take 1 capsule (100 mg total) by mouth 2 (two) times daily. 10 capsule 0  . folic acid (FOLVITE) 1 MG tablet TAKE 1 TABLET (1 MG TOTAL) BY MOUTH DAILY. 90 tablet 1  . hydrochlorothiazide (HYDRODIURIL) 25 MG tablet Take 25 mg by mouth daily.    [name] Kitchen levothyroxine (SYNTHROID, LEVOTHROID) 25 MCG tablet Take 25 mcg by mouth daily.    [name] Kitchen lisinopril (PRINIVIL,ZESTRIL) 40 MG tablet Take 40 mg by mouth daily.    [name] Kitchen omeprazole (PRILOSEC) 20 MG capsule Take 1 capsule (20 mg total) by mouth daily. 60 capsule 0  . potassium chloride (KLOR-CON) 20 MEQ packet Take 20 mEq by mouth daily.    . pravastatin (PRAVACHOL) 40 MG tablet Take 40 mg by mouth every evening.     No current facility-administered medications for this visit.     Review of Systems:  GENERAL: Feels "ok; better than last week". Fever, resolved.  No sweats.  Weight stable. PERFORMANCE STATUS (ECOG): 1 HEENT: Nasal congestion, resolved.  No visual changes, sore throat, mouth sores or tenderness. Lungs: No shortness of breath or cough. No hemoptysis. Cardiac: No chest pain, palpitations, orthopnea, or PND. GI: Diarrhea, resolved.  No nausea, vomiting, constipation, melena or hematochezia. GU: No urgency, frequency, dysuria, or hematuria. Musculoskeletal: Achiness, improved.  No back pain. No muscle tenderness. Extremities: No pain or swelling. Skin: Discoid lupus. No rashes, skin changes or ulcers. Neuro: No headache, weakness, balance or  coordination issues. Endocrine: No diabetes, thyroid issues, hot flashes or night sweats. Psych: No mood changes, depression or anxiety. Pain:  No back pain. No joint pain. No muscle aches. Review of systems: All other systems reviewed and found to be negative.  Physical Exam: Blood pressure (!) 152/75, pulse 69, temperature (!) 95 F (35 C), temperature source Tympanic, resp. rate 18, weight 171 lb 11.8 oz (77.9 kg), SpO2 100 %. GENERAL:Well developed, well nourished, woman sitting comfortably in the exam room in no acute distress. MENTAL STATUS: Alert and oriented to person, place and time. HEAD:  [name] hair pulled back.  Normocephalic, atraumatic, face symmetric, no Cushingoid features. Checks full. EYES:Glasses.  Blue eyes.  Pupils equal round and reactive to light and accomodation. No conjunctivitis or scleral icterus. WIO:MBTDH nasal mucosa slightly red without vesicles or mucus.  Oropharynx clear without lesion. Tonguenormal. Mucous membranes moist. RESPIRATORY:Clear to auscultationwithout rales, wheezes or rhonchi. CARDIOVASCULAR:Regular rate andrhythmwithout murmur, rub or gallop. ABDOMEN:Soft, non-tender, with active bowel sounds, and no hepatosplenomegaly. No masses. SKIN: No petechiae or ecchymosis. EXTREMITIES: No edema, no skin discoloration or tenderness. No palpable cords. LYMPHNODES: No palpable cervical, supraclavicular, axillary  or inguinal adenopathy  NEUROLOGICAL: Unremarkable   Appointment on 11/03/2017  Component Date Value Ref Range Status  . LDH 11/03/2017 222* 98 - 192 U/L Final   Performed at [facility], [address] [place], [place] [address]  . Uric Acid, Serum 11/03/2017 9.5* 2.3 - 6.6 mg/dL Final   Performed at [facility], [address], [place], [place] [address]  . Sodium 11/03/2017 139  135 - 145 mmol/L Final  . Potassium 11/03/2017 4.2  3.5 - 5.1 mmol/L Final  . Chloride 11/03/2017 104  101 -  111 mmol/L Final  . CO2 11/03/2017 26  22 - 32 mmol/L Final  . Glucose, Bld 11/03/2017 87  65 - 99 mg/dL Final  . BUN 11/03/2017 27* 6 - 20 mg/dL Final  . Creatinine, Ser 11/03/2017 1.15* 0.44 - 1.00 mg/dL Final  . Calcium 11/03/2017 8.9  8.9 - 10.3 mg/dL Final  . Total Protein 11/03/2017 7.9  6.5 - 8.1 g/dL Final  . Albumin 11/03/2017 4.5  3.5 - 5.0 g/dL Final  . AST 11/03/2017 74* 15 - 41 U/L Final  . ALT 11/03/2017 183* 14 - 54 U/L Final  . Alkaline Phosphatase 11/03/2017 371* 38 - 126 U/L Final  . Total Bilirubin 11/03/2017 0.8  0.3 - 1.2 mg/dL Final  . GFR calc non Af Amer 11/03/2017 51* >60 mL/min Final  . GFR calc Af Amer 11/03/2017 59* >60 mL/min Final   Comment: (NOTE) The eGFR has been calculated using the CKD EPI equation. This calculation has not been validated in all clinical situations. eGFR's persistently <60 mL/min signify possible Chronic Kidney Disease.   Georgiann Hahn gap 11/03/2017 9  5 - 15 Final   Performed at [facility], [address] [place], [place] [address]  . WBC 11/03/2017 4.7  3.6 - 11.0 K/uL Final  . RBC 11/03/2017 3.02* 3.80 - 5.20 MIL/uL Final  . Hemoglobin 11/03/2017 9.8* 12.0 - 16.0 g/dL Final  . HCT 11/03/2017 28.8* 35.0 - 47.0 % Final  . MCV 11/03/2017 95.3  80.0 - 100.0 fL Final  . MCH 11/03/2017 32.3  26.0 - 34.0 pg Final  . MCHC 11/03/2017 33.9  32.0 - 36.0 g/dL Final  . RDW 11/03/2017 14.4  11.5 - 14.5 % Final  . Platelets 11/03/2017 152  150 - 440 K/uL Final  . Neutrophils Relative % 11/03/2017 65  % Final  . Neutro Abs 11/03/2017 3.1  1.4 - 6.5 K/uL Final  . Lymphocytes Relative 11/03/2017 24  % Final  . Lymphs Abs 11/03/2017 1.1  1.0 - 3.6 K/uL Final  . Monocytes Relative 11/03/2017 6  % Final  . Monocytes Absolute 11/03/2017 0.3  0.2 - 0.9 K/uL Final  . Eosinophils Relative 11/03/2017 3  % Final  . Eosinophils Absolute 11/03/2017 0.1  0 - 0.7 K/uL Final  . Basophils Relative 11/03/2017 2  % Final  . Basophils Absolute  11/03/2017 0.1  0 - 0.1 K/uL Final   Performed at [facility], [address], [place], [place] [address]  Appointment on 11/02/2017  Component Date Value Ref Range Status  . Sodium 11/02/2017 137  135 - 145 mmol/L Final  . Potassium 11/02/2017 4.5  3.5 - 5.1 mmol/L Final  . Chloride 11/02/2017 106  101 - 111 mmol/L Final  . CO2 11/02/2017 25  22 - 32 mmol/L Final  . Glucose, Bld 11/02/2017 86  65 - 99 mg/dL Final  . BUN 11/02/2017 33* 6 -  20 mg/dL Final  . Creatinine, Ser 11/02/2017 1.27* 0.44 - 1.00 mg/dL Final  . Calcium 11/02/2017 9.3  8.9 - 10.3 mg/dL Final  . Total Protein 11/02/2017 7.7  6.5 - 8.1 g/dL Final  . Albumin 11/02/2017 4.1  3.5 - 5.0 g/dL Final  . AST 11/02/2017 103* 15 - 41 U/L Final  . ALT 11/02/2017 213* 14 - 54 U/L Final  . Alkaline Phosphatase 11/02/2017 350* 38 - 126 U/L Final  . Total Bilirubin 11/02/2017 1.0  0.3 - 1.2 mg/dL Final  . GFR calc non Af Amer 11/02/2017 45* >60 mL/min Final  . GFR calc Af Amer 11/02/2017 52* >60 mL/min Final   Comment: (NOTE) The eGFR has been calculated using the CKD EPI equation. This calculation has not been validated in all clinical situations. eGFR's persistently <60 mL/min signify possible Chronic Kidney Disease.   Georgiann Hahn gap 11/02/2017 6  5 - 15 Final   Performed at [facility], [address] [place], [place] [address]  . Retic Ct Pct 11/02/2017 6.2* 0.4 - 3.1 % Final  . RBC. 11/02/2017 3.10* 3.80 - 5.20 MIL/uL Final  . Retic Count, Absolute 11/02/2017 192.2* 19.0 - 183.0 K/uL Final   Performed at [facility], [address], [place], [place] [address]  . LDH 11/02/2017 232* 98 - 192 U/L Final   Performed at [facility], [address] [place], [place] [address]  . Polyspecific AHG test 11/02/2017 POS   Final  . DAT, IgG 11/02/2017 POS   Final  . DAT, complement 11/02/2017    Final                   Value:POS Performed at [facility], [address]  [place], [place] [address]   . WBC 11/02/2017 4.8  3.6 - 11.0 K/uL Final  . RBC 11/02/2017 2.93* 3.80 - 5.20 MIL/uL Final  . Hemoglobin 11/02/2017 9.5* 12.0 - 16.0 g/dL Final  . HCT 11/02/2017 28.3* 35.0 - 47.0 % Final  . MCV 11/02/2017 96.5  80.0 - 100.0 fL Final  . MCH 11/02/2017 32.6  26.0 - 34.0 pg Final  . MCHC 11/02/2017 33.7  32.0 - 36.0 g/dL Final  . RDW 11/02/2017 14.5  11.5 - 14.5 % Final  . Platelets 11/02/2017 163  150 - 440 K/uL Final  . Neutrophils Relative % 11/02/2017 67  % Final  . Neutro Abs 11/02/2017 3.2  1.4 - 6.5 K/uL Final  . Lymphocytes Relative 11/02/2017 22  % Final  . Lymphs Abs 11/02/2017 1.1  1.0 - 3.6 K/uL Final  . Monocytes Relative 11/02/2017 6  % Final  . Monocytes Absolute 11/02/2017 0.3  0.2 - 0.9 K/uL Final  . Eosinophils Relative 11/02/2017 3  % Final  . Eosinophils Absolute 11/02/2017 0.2  0 - 0.7 K/uL Final  . Basophils Relative 11/02/2017 2  % Final  . 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 One week prior to presentation, she felt like she was coming down with something. She denied any fever, runny nose, sore throat or cough. She had some diarrhea. She denied any new medications or herbal products.  Anemia workup revealed a cold autoantibody (IgG and complement).  Reticulocyte count was 11.3%.  Ferritin was 562.  Iron studies revealed a saturation of 20% and a TIBC of 214 (low).  B12 was 231 (low normal).  Folate was 42.   Peripheral smear revealed rouleaux formation.  Additional testing included the following + studies:  hepatitis C antibody, hepatitis B core antibody, CMV IgM, and EBV VCA (IgM and IgG).  Hepatitis B by PCR was negative.  Hepatitis C RNA was negative.  Mycoplasma pneumonia IgM was  negative.  Reticulocyte count was 11.3% (high) indicating appropriate marrow response.  C3 and C4 were normal.  Cold agglutinin titer was negative x 2.  Negative studies included:  ANA, hepatitis B surface antigen, SPEP, and free light chain ratio.   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  MMA was initially 330 (normal).  Repeat MMA was elevated on 08/01/2016 confirming B12 deficiency.  Hepatitis B surface antibody was positive and hepatitis B surface antigen was negative on 08/06/2016.  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 There was no adenopathy. Spleen was normal.  Abdominal ultrasound on 04/15/2017 revealed a normal spleen and sludge in the gallbladder.  The liver was echogenic consistent fatty infiltration and/or hepatocellular disease.  She underwent PTCA and stent placement in right and left common iliac arteries and left external iliac artery on 07/10/2016.  She is on Plavix.  EGD on 03/19/2015 revealed gastritis in the body and antrum. Colonoscopy on 03/19/2015 revealed one hyperplastic polyp. EGD on 07/08/2016 was normal.  No evidence of bleeding.  She has received 6 units of warmed PRBCs to date (last on 08/01/2016). If Rituxan is needed, she requires entecavir 0.5 mg q day beginning 2 weeks prior to Rituxan.  In addition, hepatitis B viral level and LFTs should be checked monthly.  She began steroids (1 mg/kg) on 08/01/2016.  She stopped prednisone on 03/12/2017.  She is on folic acid 1 mg a day.  She received B12 1000 mcg IM on 08/06/2016.  She is on oral B12.  B12 and folate were normal on 03/09/2017.  She developed peri-oral and intranasal herpes simplex-1.  She was treated with valacyclovir and doxycycline on 10/19/2016.  She developed a transient elevated  alkaline phosphatase on 10/19/2016 which subsequently normalized.  She was documented to have a warm autoantibody on 10/12/2017.  LDH and bilirubin were normal.  Hematocrit had improved from prior testing.    She developed flu like symptoms on 10/26/2017.  Symptoms included cough, myalgias, and fever (tmax unknown).  She was prescribed Mucinex, Tamiflu, and amoxicillin.  She only took amoxicillin x 5 days.  She developed increased liver function tests on 11/02/2017.  Symptomatically, she denies any B symptoms.  Exam is unremarkable with no adenopathy, hepatosplenomegaly or abdominal tenderness. Hemoglobin is 9.8.  Platelet count is 152,000. Creatinine is 1.15.  Uric acid is 9.5 (elevated). AST is 74, ALT 183, alkaline phosphatase 371. Total bilirubin is 0.8.  Plan: 1.  Labs today: CBC with differential, CMP, ANA, uric acid, hepatitis B DNA, HCV RNA, anti-mitochondrial antibodies, GGT, HAV Ab total, HAV Ab IgM, acute hepatitis panel, HBV core Ab, HBV surface Ag, HCV Ab. 2.  Discuss + warm autoantibody and new increased liver function tests.  Warm auto antibody started before amoxicillin.  Agree with discontinuation of amoxicillin.  Viral infection may have also caused hemolytic anemia.  Patient consented  to HIV testing.  Discuss concern for possible hepatitis B or C flare with increased LFTs.   3.  Review labs from 11/02/2017.  Marked elevation in patient's LFTs noted.  Total bilirubin normal at 1.0 and LDH minimally elevated, which suggests no significant hemolysis although drop in hemoglobin and retic count high (6%).  Dr. Allen [place] consulted via phone on 11/02/2017.  Recommendations were for additional lab testing and RUQ ultrasound.  Postpone initiation of steroids until activity of hepatitis (B and C) can be determined. 4.  Schedule RUQ ultrasound.  5.  Uric acid elevated at 9.5. Will start on allopurinol 300 mg daily.  6.  Discuss consideration for imaging (CT scans) give elevated uric acid and warm  autoimmune hemolytic anemia (r/o lymphoproliferative disorder). 7.  RTC on 11/05/2017 for labs:  CBC with diff, CMP, uric acid, LDH, AFP, HIV testing. Patient consented to HIV testing.  8.  RTC on 11/09/2017 for MD assessment and labs (CBC with diff, CMP)   Honor Loh, NP  11/03/2017, 9:27 AM   I saw and evaluated the patient, participating in the key portions of the service and reviewing pertinent diagnostic studies and records.  I reviewed the nurse practitioner's note and agree with the findings and the plan.  The assessment and plan were discussed with the patient. Multiple questions were asked by the patient and answered.   Nolon Stalls, MD 11/03/2017,9:27 AM

## 2017-11-02 NOTE — Telephone Encounter (Signed)
Called patient to inquire if she can come back today for additional labs.  She states she cannot. She will do everything in [place] tomorrow.  She works 3rd shift and was actually asleep when I called.

## 2017-11-03 ENCOUNTER — Inpatient Hospital Stay: Payer: BLUE CROSS/BLUE SHIELD

## 2017-11-03 ENCOUNTER — Encounter: Payer: Self-pay | Admitting: Hematology and Oncology

## 2017-11-03 ENCOUNTER — Other Ambulatory Visit: Payer: Self-pay | Admitting: *Deleted

## 2017-11-03 ENCOUNTER — Other Ambulatory Visit: Payer: Self-pay | Admitting: Hematology and Oncology

## 2017-11-03 ENCOUNTER — Inpatient Hospital Stay (HOSPITAL_BASED_OUTPATIENT_CLINIC_OR_DEPARTMENT_OTHER): Payer: BLUE CROSS/BLUE SHIELD | Admitting: Hematology and Oncology

## 2017-11-03 VITALS — BP 152/75 | HR 69 | Temp 95.0°F | Resp 18 | Wt 171.7 lb

## 2017-11-03 DIAGNOSIS — R945 Abnormal results of liver function studies: Secondary | ICD-10-CM | POA: Diagnosis not present

## 2017-11-03 DIAGNOSIS — L930 Discoid lupus erythematosus: Secondary | ICD-10-CM | POA: Diagnosis not present

## 2017-11-03 DIAGNOSIS — D649 Anemia, unspecified: Secondary | ICD-10-CM

## 2017-11-03 DIAGNOSIS — R7989 Other specified abnormal findings of blood chemistry: Secondary | ICD-10-CM | POA: Insufficient documentation

## 2017-11-03 DIAGNOSIS — D591 Autoimmune hemolytic anemia, unspecified: Secondary | ICD-10-CM

## 2017-11-03 DIAGNOSIS — D5911 Warm autoimmune hemolytic anemia: Secondary | ICD-10-CM | POA: Insufficient documentation

## 2017-11-03 DIAGNOSIS — B1920 Unspecified viral hepatitis C without hepatic coma: Secondary | ICD-10-CM

## 2017-11-03 DIAGNOSIS — D5912 Cold autoimmune hemolytic anemia: Secondary | ICD-10-CM

## 2017-11-03 DIAGNOSIS — E790 Hyperuricemia without signs of inflammatory arthritis and tophaceous disease: Secondary | ICD-10-CM | POA: Diagnosis not present

## 2017-11-03 DIAGNOSIS — B1910 Unspecified viral hepatitis B without hepatic coma: Secondary | ICD-10-CM | POA: Diagnosis not present

## 2017-11-03 DIAGNOSIS — D599 Acquired hemolytic anemia, unspecified: Secondary | ICD-10-CM

## 2017-11-03 HISTORY — DX: Other specified abnormal findings of blood chemistry: R79.89

## 2017-11-03 LAB — CBC WITH DIFFERENTIAL/PLATELET
Basophils Absolute: 0.1 10*3/uL (ref 0–0.1)
Basophils Relative: 2 %
Eosinophils Absolute: 0.1 10*3/uL (ref 0–0.7)
Eosinophils Relative: 3 %
HCT: 28.8 % — ABNORMAL LOW (ref 35.0–47.0)
Hemoglobin: 9.8 g/dL — ABNORMAL LOW (ref 12.0–16.0)
Lymphocytes Relative: 24 %
Lymphs Abs: 1.1 10*3/uL (ref 1.0–3.6)
MCH: 32.3 pg (ref 26.0–34.0)
MCHC: 33.9 g/dL (ref 32.0–36.0)
MCV: 95.3 fL (ref 80.0–100.0)
Monocytes Absolute: 0.3 10*3/uL (ref 0.2–0.9)
Monocytes Relative: 6 %
Neutro Abs: 3.1 10*3/uL (ref 1.4–6.5)
Neutrophils Relative %: 65 %
Platelets: 152 10*3/uL (ref 150–440)
RBC: 3.02 MIL/uL — ABNORMAL LOW (ref 3.80–5.20)
RDW: 14.4 % (ref 11.5–14.5)
WBC: 4.7 10*3/uL (ref 3.6–11.0)

## 2017-11-03 LAB — COMPREHENSIVE METABOLIC PANEL
ALT: 183 U/L — ABNORMAL HIGH (ref 14–54)
AST: 74 U/L — ABNORMAL HIGH (ref 15–41)
Albumin: 4.5 g/dL (ref 3.5–5.0)
Alkaline Phosphatase: 371 U/L — ABNORMAL HIGH (ref 38–126)
Anion gap: 9 (ref 5–15)
BUN: 27 mg/dL — ABNORMAL HIGH (ref 6–20)
CO2: 26 mmol/L (ref 22–32)
Calcium: 8.9 mg/dL (ref 8.9–10.3)
Chloride: 104 mmol/L (ref 101–111)
Creatinine, Ser: 1.15 mg/dL — ABNORMAL HIGH (ref 0.44–1.00)
GFR calc Af Amer: 59 mL/min — ABNORMAL LOW (ref >60)
GFR calc non Af Amer: 51 mL/min — ABNORMAL LOW (ref >60)
Glucose, Bld: 87 mg/dL (ref 65–99)
Potassium: 4.2 mmol/L (ref 3.5–5.1)
Sodium: 139 mmol/L (ref 135–145)
Total Bilirubin: 0.8 mg/dL (ref 0.3–1.2)
Total Protein: 7.9 g/dL (ref 6.5–8.1)

## 2017-11-03 LAB — URIC ACID: Uric Acid, Serum: 9.5 mg/dL — ABNORMAL HIGH (ref 2.3–6.6)

## 2017-11-03 LAB — LACTATE DEHYDROGENASE: LDH: 222 U/L — ABNORMAL HIGH (ref 98–192)

## 2017-11-03 LAB — GAMMA GT: GGT: 385 U/L — ABNORMAL HIGH (ref 7–50)

## 2017-11-03 MED ORDER — ALLOPURINOL 300 MG PO TABS: 300.0000 mg | ORAL_TABLET | Freq: Every day | ORAL | 1 refills | Status: DC

## 2017-11-03 NOTE — Progress Notes (Signed)
Patient states she had the fu last week and was on amoxil, tamiflu and mucinex.  She has finished these medications.

## 2017-11-04 ENCOUNTER — Ambulatory Visit
Admission: RE | Admit: 2017-11-04 | Discharge: 2017-11-04 | Disposition: A | Discharge status: Home / Self Care | Payer: BLUE CROSS/BLUE SHIELD | Source: Ambulatory Visit | Attending: Hematology and Oncology | Admitting: Hematology and Oncology

## 2017-11-04 DIAGNOSIS — B1910 Unspecified viral hepatitis B without hepatic coma: Secondary | ICD-10-CM | POA: Diagnosis not present

## 2017-11-04 DIAGNOSIS — R945 Abnormal results of liver function studies: Secondary | ICD-10-CM | POA: Insufficient documentation

## 2017-11-04 DIAGNOSIS — B1920 Unspecified viral hepatitis C without hepatic coma: Secondary | ICD-10-CM | POA: Insufficient documentation

## 2017-11-04 DIAGNOSIS — R9389 Abnormal findings on diagnostic imaging of other specified body structures: Secondary | ICD-10-CM | POA: Insufficient documentation

## 2017-11-04 DIAGNOSIS — R7989 Other specified abnormal findings of blood chemistry: Secondary | ICD-10-CM

## 2017-11-04 LAB — ANA W/REFLEX: Anti Nuclear Antibody(ANA): POSITIVE — AB

## 2017-11-04 LAB — AFP TUMOR MARKER: AFP, Serum, Tumor Marker: 2.1 ng/mL (ref 0.0–8.3)

## 2017-11-04 LAB — ENA+DNA/DS+SJORGEN'S
ENA SM Ab Ser-aCnc: <0.2 AI (ref 0.0–0.9)
Ribonucleic Protein: 1.1 AI — ABNORMAL HIGH (ref 0.0–0.9)
SSA (Ro) (ENA) Antibody, IgG: 0.2 AI (ref 0.0–0.9)
SSB (La) (ENA) Antibody, IgG: <0.2 AI (ref 0.0–0.9)
ds DNA Ab: 1 IU/mL (ref 0–9)

## 2017-11-04 LAB — HEPATITIS B SURFACE ANTIGEN: HEP B S AG: NEGATIVE

## 2017-11-04 LAB — HEPATITIS B DNA, ULTRAQUANTITATIVE, PCR
HBV DNA SERPL PCR-ACNC: NOT DETECTED IU/mL
HBV DNA SERPL PCR-LOG IU: UNDETERMINED log10 IU/mL

## 2017-11-04 LAB — HIV ANTIBODY (ROUTINE TESTING W REFLEX): HIV Screen 4th Generation wRfx: NONREACTIVE

## 2017-11-04 LAB — HEPATITIS PANEL, ACUTE
HCV Ab: 7.7 s/co ratio — ABNORMAL HIGH (ref 0.0–0.9)
Hep A IgM: NEGATIVE
Hep B C IgM: NEGATIVE
Hepatitis B Surface Ag: NEGATIVE

## 2017-11-04 LAB — HCV RNA QUANT RFLX ULTRA OR GENOTYP
HCV RNA Qnt(log copy/mL): UNDETERMINED log10 IU/mL
HepC Qn: NOT DETECTED IU/mL

## 2017-11-04 LAB — HEPATITIS A ANTIBODY, TOTAL: Hep A Total Ab: NEGATIVE

## 2017-11-04 LAB — HEPATITIS B CORE ANTIBODY, TOTAL: Hep B Core Total Ab: POSITIVE — AB

## 2017-11-05 ENCOUNTER — Other Ambulatory Visit: Payer: Self-pay | Admitting: *Deleted

## 2017-11-05 ENCOUNTER — Inpatient Hospital Stay: Payer: BLUE CROSS/BLUE SHIELD

## 2017-11-05 DIAGNOSIS — D649 Anemia, unspecified: Secondary | ICD-10-CM

## 2017-11-05 DIAGNOSIS — D591 Other autoimmune hemolytic anemias: Secondary | ICD-10-CM | POA: Diagnosis not present

## 2017-11-05 LAB — CBC WITH DIFFERENTIAL/PLATELET
Basophils Absolute: 0.1 10*3/uL (ref 0–0.1)
Basophils Relative: 1 %
Eosinophils Absolute: 0.1 10*3/uL (ref 0–0.7)
Eosinophils Relative: 2 %
HCT: 30.4 % — ABNORMAL LOW (ref 35.0–47.0)
Hemoglobin: 10.3 g/dL — ABNORMAL LOW (ref 12.0–16.0)
Lymphocytes Relative: 17 %
Lymphs Abs: 1 10*3/uL (ref 1.0–3.6)
MCH: 32.8 pg (ref 26.0–34.0)
MCHC: 33.8 g/dL (ref 32.0–36.0)
MCV: 96.9 fL (ref 80.0–100.0)
Monocytes Absolute: 0.2 10*3/uL (ref 0.2–0.9)
Monocytes Relative: 4 %
Neutro Abs: 4.3 10*3/uL (ref 1.4–6.5)
Neutrophils Relative %: 76 %
Platelets: 161 10*3/uL (ref 150–440)
RBC: 3.14 MIL/uL — ABNORMAL LOW (ref 3.80–5.20)
RDW: 15.4 % — ABNORMAL HIGH (ref 11.5–14.5)
WBC: 5.7 10*3/uL (ref 3.6–11.0)

## 2017-11-05 LAB — COMPREHENSIVE METABOLIC PANEL
ALT: 95 U/L — ABNORMAL HIGH (ref 14–54)
AST: 32 U/L (ref 15–41)
Albumin: 4 g/dL (ref 3.5–5.0)
Alkaline Phosphatase: 308 U/L — ABNORMAL HIGH (ref 38–126)
Anion gap: 5 (ref 5–15)
BUN: 23 mg/dL — ABNORMAL HIGH (ref 6–20)
CO2: 30 mmol/L (ref 22–32)
Calcium: 9.5 mg/dL (ref 8.9–10.3)
Chloride: 102 mmol/L (ref 101–111)
Creatinine, Ser: 1.17 mg/dL — ABNORMAL HIGH (ref 0.44–1.00)
GFR calc Af Amer: 58 mL/min — ABNORMAL LOW (ref >60)
GFR calc non Af Amer: 50 mL/min — ABNORMAL LOW (ref >60)
Glucose, Bld: 97 mg/dL (ref 65–99)
Potassium: 4.6 mmol/L (ref 3.5–5.1)
Sodium: 137 mmol/L (ref 135–145)
Total Bilirubin: 0.9 mg/dL (ref 0.3–1.2)
Total Protein: 7.7 g/dL (ref 6.5–8.1)

## 2017-11-05 LAB — MITOCHONDRIAL ANTIBODIES: Mitochondrial M2 Ab, IgG: <20 Units (ref 0.0–20.0)

## 2017-11-05 LAB — URIC ACID: Uric Acid, Serum: 6.9 mg/dL — ABNORMAL HIGH (ref 2.3–6.6)

## 2017-11-05 LAB — LACTATE DEHYDROGENASE: LDH: 190 U/L (ref 98–192)

## 2017-11-06 LAB — HIV ANTIBODY (ROUTINE TESTING W REFLEX): HIV Screen 4th Generation wRfx: NONREACTIVE

## 2017-11-06 LAB — AFP TUMOR MARKER: AFP, Serum, Tumor Marker: 2.1 ng/mL (ref 0.0–8.3)

## 2017-11-09 ENCOUNTER — Telehealth: Payer: Self-pay | Admitting: Urgent Care

## 2017-11-09 ENCOUNTER — Encounter: Payer: Self-pay | Admitting: Hematology and Oncology

## 2017-11-09 ENCOUNTER — Inpatient Hospital Stay (HOSPITAL_BASED_OUTPATIENT_CLINIC_OR_DEPARTMENT_OTHER): Payer: BLUE CROSS/BLUE SHIELD | Admitting: Hematology and Oncology

## 2017-11-09 ENCOUNTER — Other Ambulatory Visit: Payer: Self-pay | Admitting: Hematology and Oncology

## 2017-11-09 ENCOUNTER — Inpatient Hospital Stay: Payer: BLUE CROSS/BLUE SHIELD

## 2017-11-09 VITALS — BP 121/73 | HR 86 | Temp 96.9°F | Resp 18 | Wt 169.2 lb

## 2017-11-09 DIAGNOSIS — B1910 Unspecified viral hepatitis B without hepatic coma: Secondary | ICD-10-CM | POA: Diagnosis not present

## 2017-11-09 DIAGNOSIS — D591 Autoimmune hemolytic anemia, unspecified: Secondary | ICD-10-CM

## 2017-11-09 DIAGNOSIS — R7989 Other specified abnormal findings of blood chemistry: Secondary | ICD-10-CM

## 2017-11-09 DIAGNOSIS — R945 Abnormal results of liver function studies: Secondary | ICD-10-CM | POA: Diagnosis not present

## 2017-11-09 DIAGNOSIS — F17210 Nicotine dependence, cigarettes, uncomplicated: Secondary | ICD-10-CM

## 2017-11-09 DIAGNOSIS — D599 Acquired hemolytic anemia, unspecified: Secondary | ICD-10-CM

## 2017-11-09 DIAGNOSIS — E790 Hyperuricemia without signs of inflammatory arthritis and tophaceous disease: Secondary | ICD-10-CM

## 2017-11-09 DIAGNOSIS — D649 Anemia, unspecified: Secondary | ICD-10-CM

## 2017-11-09 DIAGNOSIS — B1920 Unspecified viral hepatitis C without hepatic coma: Secondary | ICD-10-CM | POA: Diagnosis not present

## 2017-11-09 DIAGNOSIS — Z87891 Personal history of nicotine dependence: Secondary | ICD-10-CM

## 2017-11-09 LAB — URINALYSIS, COMPLETE (UACMP) WITH MICROSCOPIC
BILIRUBIN URINE: NEGATIVE
Bacteria, UA: NONE SEEN
GLUCOSE, UA: NEGATIVE mg/dL
Hgb urine dipstick: NEGATIVE
KETONES UR: NEGATIVE mg/dL
LEUKOCYTES UA: NEGATIVE
Nitrite: NEGATIVE
PROTEIN: NEGATIVE mg/dL
Specific Gravity, Urine: 1.017 (ref 1.005–1.030)
pH: 5 (ref 5.0–8.0)

## 2017-11-09 LAB — COMPREHENSIVE METABOLIC PANEL
ALT: 29 U/L (ref 14–54)
AST: 18 U/L (ref 15–41)
Albumin: 4 g/dL (ref 3.5–5.0)
Alkaline Phosphatase: 182 U/L — ABNORMAL HIGH (ref 38–126)
Anion gap: 6 (ref 5–15)
BUN: 45 mg/dL — ABNORMAL HIGH (ref 6–20)
CO2: 26 mmol/L (ref 22–32)
Calcium: 9.1 mg/dL (ref 8.9–10.3)
Chloride: 106 mmol/L (ref 101–111)
Creatinine, Ser: 1.85 mg/dL — ABNORMAL HIGH (ref 0.44–1.00)
GFR calc Af Amer: 33 mL/min — ABNORMAL LOW (ref >60)
GFR calc non Af Amer: 29 mL/min — ABNORMAL LOW (ref >60)
Glucose, Bld: 98 mg/dL (ref 65–99)
Potassium: 4.7 mmol/L (ref 3.5–5.1)
Sodium: 138 mmol/L (ref 135–145)
Total Bilirubin: 0.7 mg/dL (ref 0.3–1.2)
Total Protein: 7.6 g/dL (ref 6.5–8.1)

## 2017-11-09 LAB — CBC WITH DIFFERENTIAL/PLATELET
Basophils Absolute: 0.1 10*3/uL (ref 0–0.1)
Basophils Relative: 2 %
Eosinophils Absolute: 0.2 10*3/uL (ref 0–0.7)
Eosinophils Relative: 3 %
HCT: 26.5 % — ABNORMAL LOW (ref 35.0–47.0)
Hemoglobin: 9.1 g/dL — ABNORMAL LOW (ref 12.0–16.0)
Lymphocytes Relative: 21 %
Lymphs Abs: 1.2 10*3/uL (ref 1.0–3.6)
MCH: 33.5 pg (ref 26.0–34.0)
MCHC: 34.3 g/dL (ref 32.0–36.0)
MCV: 97.7 fL (ref 80.0–100.0)
Monocytes Absolute: 0.4 10*3/uL (ref 0.2–0.9)
Monocytes Relative: 7 %
Neutro Abs: 3.7 10*3/uL (ref 1.4–6.5)
Neutrophils Relative %: 67 %
Platelets: 164 10*3/uL (ref 150–440)
RBC: 2.71 MIL/uL — ABNORMAL LOW (ref 3.80–5.20)
RDW: 15.8 % — ABNORMAL HIGH (ref 11.5–14.5)
WBC: 5.5 10*3/uL (ref 3.6–11.0)

## 2017-11-09 LAB — URIC ACID: Uric Acid, Serum: 6.7 mg/dL — ABNORMAL HIGH (ref 2.3–6.6)

## 2017-11-09 LAB — RETICULOCYTES
RBC.: 2.86 MIL/uL — ABNORMAL LOW (ref 3.80–5.20)
Retic Count, Absolute: 208.8 10*3/uL — ABNORMAL HIGH (ref 19.0–183.0)
Retic Ct Pct: 7.3 % — ABNORMAL HIGH (ref 0.4–3.1)

## 2017-11-09 LAB — GAMMA GT: GGT: 182 U/L — ABNORMAL HIGH (ref 7–50)

## 2017-11-09 LAB — LACTATE DEHYDROGENASE: LDH: 205 U/L — ABNORMAL HIGH (ref 98–192)

## 2017-11-09 MED ORDER — ALLOPURINOL 100 MG PO TABS: 100.0000 mg | ORAL_TABLET | Freq: Every day | ORAL | 0 refills | Status: DC

## 2017-11-09 NOTE — Progress Notes (Signed)
[facility] day:  11/09/2017  Chief Complaint: Tina Page is a 62 y.o. female with a h/o cold hemolytic anemia and previous history of hepatitis B and C who is seen for 1 week assessment of newly diagnosed warm autoimmune hemolytic anemia with concurrent elevated liver function tests.  HPI:  The patient was last seen in the hematology clinic on 11/03/2017.  At that time, she denied any B symptoms.  Exam was unremarkable.. Hemoglobin was 9.8.  Platelet count was 152,000. Creatinine was 1.15.  Uric acid was 9.5 (elevated). AST was 74, ALT 183, alkaline phosphatase 371. Total bilirubin was 0.8.  Additional labs were drawn: GGT was 385.  LDH was 222.  ANA was positive (1.1 ribonucleic protein).  HIV antibody was negative.  AFP was 2.1.  Hepatitis A total antibody was negative.  Hepatitis A IgM was negative.  Hepatitis B DNA and hepatitis C RNA were negative.  Hepatitis C antibody was 7.7 (0-0.9).  Hepatitis B surface antigen negative.  Hepatitis B core antibody total antibody positive.  Mitochondrial antibodies were negative.  Abdominal ultrasound on 11/04/2017 revealed a 1.2 cm persistent echogenic focus along the posterior gallbladder wall, likely a sessile polyp. There were no visible stones or wall thickening.  No acute findings.  She was started on allopurinol 300 mg daily.  During the interim, patient has been experiencing dizziness. She denies any bleeding or change in urine.  She notes that she suffered an atraumatic fall approximately 3 weeks ago. Patient is eating and drinking normally. Her weight is down 3 pounds. She is having 2-3 loose stools a day.  Patient denies pain in the clinic today.    Past Medical History:  Diagnosis Date  . Anemia   . COPD (chronic obstructive pulmonary disease) ([place])   . HLD (hyperlipidemia)   . Hypertension     Past Surgical History:  Procedure Laterality Date  . ESOPHAGOGASTRODUODENOSCOPY (EGD) WITH  PROPOFOL N/A 07/08/2016   Procedure: ESOPHAGOGASTRODUODENOSCOPY (EGD) WITH PROPOFOL;  Surgeon: Manya Silvas, MD;  Location: [facility];  Service: Endoscopy;  Laterality: N/A;  . KNEE SURGERY    . PERIPHERAL VASCULAR CATHETERIZATION N/A 07/10/2016   Procedure: Lower Extremity Angiography;  Surgeon: Katha Cabal, MD;  Location: [place] [facility];  Service: Cardiovascular;  Laterality: N/A;  . PERIPHERAL VASCULAR CATHETERIZATION N/A 07/10/2016   Procedure: Abdominal Aortogram w/Lower Extremity;  Surgeon: Katha Cabal, MD;  Location: [place] [facility];  Service: Cardiovascular;  Laterality: N/A;  . PERIPHERAL VASCULAR CATHETERIZATION  07/10/2016   Procedure: Lower Extremity Intervention;  Surgeon: Katha Cabal, MD;  Location: [place] [facility];  Service: Cardiovascular;;    Family History  Problem Relation Age of Onset  . Diabetes Mother   . Hypertension Mother   . Diabetes Maternal Grandfather   . Hypertension Maternal Grandfather     Social History:  reports that she quit smoking about 15 months ago. Her smoking use included cigarettes. She smoked 0.50 packs per day. she has never used smokeless tobacco. She reports that she does not drink alcohol or use drugs.  The patient works at [employer]  She is exposed to cold temperatures.  She works 3rd shift.  She has a daughter, [name] and a daughter named [name].  She is alone today.   Allergies:  Allergies  Allergen Reactions  . Codeine Anaphylaxis    Current Medications: Current Outpatient Medications  Medication Sig Dispense Refill  . albuterol (  PROVENTIL HFA;VENTOLIN HFA) 108 (90 Base) MCG/ACT inhaler Inhale into the lungs.    [name] Kitchen allopurinol (ZYLOPRIM) 300 MG tablet Take 1 tablet (300 mg total) by mouth daily. 30 tablet 1  . clopidogrel (PLAVIX) 75 MG tablet Take 1 tablet (75 mg total) by mouth daily. 30 tablet 5  . cyanocobalamin 500 MCG tablet Take 500 mcg by mouth daily.    [name] Kitchen docusate  sodium (COLACE) 100 MG capsule Take 1 capsule (100 mg total) by mouth 2 (two) times daily. 10 capsule 0  . folic acid (FOLVITE) 1 MG tablet TAKE 1 TABLET (1 MG TOTAL) BY MOUTH DAILY. 90 tablet 1  . hydrochlorothiazide (HYDRODIURIL) 25 MG tablet Take 25 mg by mouth daily.    [name] Kitchen levothyroxine (SYNTHROID, LEVOTHROID) 25 MCG tablet Take 25 mcg by mouth daily.    [name] Kitchen lisinopril (PRINIVIL,ZESTRIL) 40 MG tablet Take 40 mg by mouth daily.    [name] Kitchen omeprazole (PRILOSEC) 20 MG capsule Take 1 capsule (20 mg total) by mouth daily. 60 capsule 0  . potassium chloride (KLOR-CON) 20 MEQ packet Take 20 mEq by mouth daily.    . pravastatin (PRAVACHOL) 40 MG tablet Take 40 mg by mouth every evening.     No current facility-administered medications for this visit.     Review of Systems:  GENERAL: Feels dizzy.  No sweats.  Weight down 3 pounds. PERFORMANCE STATUS (ECOG): 1 HEENT: No visual changes, sore throat, mouth sores or tenderness. Lungs: No shortness of breath or cough. No hemoptysis. Cardiac: No chest pain, palpitations, orthopnea, or PND. GI: Loose stools.  No nausea, vomiting, constipation, melena or hematochezia. GU: No urgency, frequency, dysuria, or hematuria. Musculoskeletal: Achiness, improved.  No back pain. No muscle tenderness. Extremities: No pain or swelling. Skin: Discoid lupus. No rashes, skin changes or ulcers. Neuro: No headache, weakness, balance or coordination issues. Endocrine: No diabetes, thyroid issues, hot flashes or night sweats. Psych: No mood changes, depression or anxiety. Pain:  No back pain. No joint pain. No muscle aches. Review of systems: All other systems reviewed and found to be negative.  Physical Exam: Blood pressure 121/73, pulse 86, temperature (!) 96.9 F (36.1 C), temperature source Tympanic, resp. rate 18, weight 169 lb 4 oz (76.8 kg), SpO2 99 %. GENERAL:Well developed, well nourished, woman sitting comfortably in the exam room in no acute  distress. MENTAL STATUS: Alert and oriented to person, place and time. HEAD:  [name] hair pulled back.  Normocephalic, atraumatic, face symmetric, no Cushingoid features. Checks full. EYES:Glasses.  Blue eyes.  Pupils equal round and reactive to light and accomodation. No conjunctivitis or scleral icterus. WNI:[id number] clear without lesion. Tonguenormal. Mucous membranes moist. RESPIRATORY:Clear to auscultationwithout rales, wheezes or rhonchi. CARDIOVASCULAR:Regular rate andrhythmwithout murmur, rub or gallop. ABDOMEN:Soft, non-tender, with active bowel sounds, and no hepatosplenomegaly. No masses. SKIN: No petechiae or ecchymosis. EXTREMITIES: No edema, no skin discoloration or tenderness. No palpable cords. LYMPHNODES: No palpable cervical, supraclavicular, axillary or inguinal adenopathy  NEUROLOGICAL: Unremarkable   Appointment on 11/09/2017  Component Date Value Ref Range Status  . LDH 11/09/2017 205* 98 - 192 U/L Final   Performed at [facility], [address] [place], [place] [address]  . Sodium 11/09/2017 138  135 - 145 mmol/L Final  . Potassium 11/09/2017 4.7  3.5 - 5.1 mmol/L Final  . Chloride 11/09/2017 106  101 - 111 mmol/L Final  . CO2 11/09/2017 26  22 - 32 mmol/L Final  . Glucose, Bld 11/09/2017 98  65 - 99 mg/dL Final  .  BUN 11/09/2017 45* 6 - 20 mg/dL Final  . Creatinine, Ser 11/09/2017 1.85* 0.44 - 1.00 mg/dL Final  . Calcium 11/09/2017 9.1  8.9 - 10.3 mg/dL Final  . Total Protein 11/09/2017 7.6  6.5 - 8.1 g/dL Final  . Albumin 11/09/2017 4.0  3.5 - 5.0 g/dL Final  . AST 11/09/2017 18  15 - 41 U/L Final  . ALT 11/09/2017 29  14 - 54 U/L Final  . Alkaline Phosphatase 11/09/2017 182* 38 - 126 U/L Final  . Total Bilirubin 11/09/2017 0.7  0.3 - 1.2 mg/dL Final  . GFR calc non Af Amer 11/09/2017 29* >60 mL/min Final  . GFR calc Af Amer 11/09/2017 33* >60 mL/min Final   Comment: (NOTE) The eGFR has been calculated using the CKD EPI  equation. This calculation has not been validated in all clinical situations. eGFR's persistently <60 mL/min signify possible Chronic Kidney Disease.   Georgiann Hahn gap 11/09/2017 6  5 - 15 Final   Performed at [facility], [address] [place], [place] [address]  . WBC 11/09/2017 5.5  3.6 - 11.0 K/uL Final  . RBC 11/09/2017 2.71* 3.80 - 5.20 MIL/uL Final  . Hemoglobin 11/09/2017 9.1* 12.0 - 16.0 g/dL Final  . HCT 11/09/2017 26.5* 35.0 - 47.0 % Final  . MCV 11/09/2017 97.7  80.0 - 100.0 fL Final  . MCH 11/09/2017 33.5  26.0 - 34.0 pg Final  . MCHC 11/09/2017 34.3  32.0 - 36.0 g/dL Final  . RDW 11/09/2017 15.8* 11.5 - 14.5 % Final  . Platelets 11/09/2017 164  150 - 440 K/uL Final  . Neutrophils Relative % 11/09/2017 67  % Final  . Neutro Abs 11/09/2017 3.7  1.4 - 6.5 K/uL Final  . Lymphocytes Relative 11/09/2017 21  % Final  . Lymphs Abs 11/09/2017 1.2  1.0 - 3.6 K/uL Final  . Monocytes Relative 11/09/2017 7  % Final  . Monocytes Absolute 11/09/2017 0.4  0.2 - 0.9 K/uL Final  . Eosinophils Relative 11/09/2017 3  % Final  . Eosinophils Absolute 11/09/2017 0.2  0 - 0.7 K/uL Final  . Basophils Relative 11/09/2017 2  % Final  . 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 One week prior to presentation, she felt like she was coming down with something. She denied any fever, runny nose, sore throat or cough. She had some diarrhea. She denied any new medications or herbal products.  Anemia workup revealed a cold autoantibody (IgG and complement).  Reticulocyte count was 11.3%.  Ferritin was 562.  Iron studies revealed a saturation of 20% and a TIBC of 214 (low).  B12 was 231 (low normal).  Folate was 42.   Peripheral smear revealed  rouleaux formation.  Additional testing included the following + studies:  hepatitis C antibody, hepatitis B core antibody, CMV IgM, and EBV VCA (IgM and IgG).  Hepatitis B by PCR was negative.  Hepatitis C RNA was negative.  Mycoplasma pneumonia IgM was negative.  Reticulocyte count was 11.3% (high) indicating appropriate marrow response.  C3 and C4 were normal.  Cold agglutinin titer was negative x 2.  Negative studies included:  ANA, hepatitis B surface antigen, SPEP, and free light chain ratio.   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  MMA was initially 330 (normal).  Repeat MMA was elevated on 08/01/2016 confirming B12 deficiency.  Hepatitis B surface antibody was positive and hepatitis B surface antigen was negative on 08/06/2016.  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 There was no adenopathy. Spleen was normal.  Abdominal ultrasound on 04/15/2017 revealed a normal spleen and sludge in the gallbladder.  The liver was echogenic consistent fatty infiltration and/or hepatocellular disease.  She underwent PTCA and stent placement in right and left common iliac arteries and left external iliac artery on 07/10/2016.  She is on Plavix.  EGD on 03/19/2015 revealed gastritis in the body and antrum. Colonoscopy on 03/19/2015 revealed one hyperplastic polyp. EGD on 07/08/2016 was normal.  No evidence of bleeding.  She has received 6 units of warmed PRBCs to date (last on 08/01/2016). If Rituxan is needed, she requires entecavir 0.5 mg q day beginning 2 weeks prior to Rituxan.  In addition, hepatitis B viral level and LFTs should be checked monthly.  She began steroids (1 mg/kg) on 08/01/2016.  She stopped prednisone on 03/12/2017.  She is on folic acid 1 mg a day.  She received B12  1000 mcg IM on 08/06/2016.  She is on oral B12.  B12 and folate were normal on 03/09/2017.  She developed peri-oral and intranasal herpes simplex-1.  She was treated with valacyclovir and doxycycline on 10/19/2016.  She developed a transient elevated alkaline phosphatase on 10/19/2016 which subsequently normalized.  She was documented to have a warm autoantibody on 10/12/2017.  LDH and bilirubin were normal.  Hematocrit had improved from prior testing.    She developed flu like symptoms on 10/26/2017.  Symptoms included cough, myalgias, and fever (tmax unknown).  She was prescribed Mucinex, Tamiflu, and amoxicillin.  She only took amoxicillin x 5 days.  She developed increased liver function tests on 11/02/2017.  Symptomatically, she denies any B symptoms.  Exam is unremarkable with no adenopathy or hepatosplenomegaly.  She is having vertiginous symptoms and loose stools.  Hemoglobin is 9.1.  Platelet count is 164,000.  LFTs are improving.  Creatinine is 1.85.  LDH is 205 and bilirubin 0.7.  Retic is 7.3%.  Plan: 1.  Labs today: CBC with differential, CMP, GGT, LDH, uric acid, retic. 2.  Urinalysis. 3.  Review abdominal ultrasound- no acute findings.  Likely a sessile polyp. 4.  Discuss vertiginous symptoms. No orthostatic changes. Patient encouraged to increase fluid intake.  5.  Refer to rheumatology (Dr Jefm Bryant):  + h/o discoid lupus, DAT + hemolytic anemia, increased creatinine.  6.  Schedule CT of abd/pelvis without contrast:  assess for adenopathy.  7.  Discuss allopurinol dose. Reduce dose to 100 mg daily due to CrCl. 8.  Discuss postponing initiation of steroids.  LDH and bilirubin not c/w ongoing hemolysis despite drop in hematocrit. 9.  RTC on 11/12/2017 for NP assessment and labs (CBC with diff, CMP, LDH, uric acid).   Honor Loh, NP  11/09/2017, 9:06 AM   I saw and evaluated the patient, participating in the key portions of the service and reviewing pertinent diagnostic studies  and records.  I reviewed the nurse practitioner's note and agree with the findings and the plan.  The assessment and plan were discussed with the patient. Multiple questions were asked by the patient and answered.  Nolon Stalls, MD 11/09/2017,9:06 AM

## 2017-11-09 NOTE — Telephone Encounter (Signed)
Call patient to discuss plan of care for rheumatology consult.  Patient scheduled at [facility], 11/10/2017, at 8:30 AM to see Dr. Cristi Loron.  Patient verbalizes understanding of appointment time and location of practice.  She indicates that she will be able to make the appointment as scheduled.

## 2017-11-09 NOTE — Progress Notes (Signed)
Patient states she has been dizzy off and on at times.  Otherwise, no complaints.

## 2017-11-10 ENCOUNTER — Ambulatory Visit
Admission: RE | Admit: 2017-11-10 | Discharge: 2017-11-10 | Disposition: A | Discharge status: Home / Self Care | Payer: BLUE CROSS/BLUE SHIELD | Source: Ambulatory Visit | Attending: Urgent Care | Admitting: Urgent Care

## 2017-11-10 ENCOUNTER — Telehealth: Payer: Self-pay | Admitting: *Deleted

## 2017-11-10 DIAGNOSIS — D591 Autoimmune hemolytic anemia, unspecified: Secondary | ICD-10-CM

## 2017-11-10 DIAGNOSIS — B1920 Unspecified viral hepatitis C without hepatic coma: Secondary | ICD-10-CM | POA: Insufficient documentation

## 2017-11-10 DIAGNOSIS — B1910 Unspecified viral hepatitis B without hepatic coma: Secondary | ICD-10-CM | POA: Diagnosis not present

## 2017-11-10 DIAGNOSIS — I700 Atherosclerosis of aorta: Secondary | ICD-10-CM | POA: Diagnosis not present

## 2017-11-10 DIAGNOSIS — R945 Abnormal results of liver function studies: Secondary | ICD-10-CM | POA: Diagnosis present

## 2017-11-10 DIAGNOSIS — R7989 Other specified abnormal findings of blood chemistry: Secondary | ICD-10-CM

## 2017-11-10 NOTE — Telephone Encounter (Signed)
Called Report:  EXAM: CT ABDOMEN AND PELVIS WITHOUT CONTRAST  TECHNIQUE: Multidetector CT imaging of the abdomen and pelvis was performed following the standard protocol without IV contrast.  COMPARISON:  CT scan of chest of July 07, 2016.  FINDINGS: Lower chest: 2.2 cm spiculated density is seen in the right middle lobe which was not present on prior exam, and is concerning for malignancy.  Hepatobiliary: No focal liver abnormality is seen. No gallstones, gallbladder wall thickening, or biliary dilatation.  Pancreas: Unremarkable. No pancreatic ductal dilatation or surrounding inflammatory changes.  Spleen: Normal in size without focal abnormality.  Adrenals/Urinary Tract: Adrenal glands are unremarkable. Kidneys are normal, without renal calculi, focal lesion, or hydronephrosis. Bladder is unremarkable.  Stomach/Bowel: Stomach is within normal limits. Appendix appears normal. No evidence of bowel wall thickening, distention, or inflammatory changes.  Vascular/Lymphatic: Aortic atherosclerosis. No enlarged abdominal or pelvic lymph nodes.  Reproductive: Uterus and bilateral adnexa are unremarkable.  Other: No abdominal wall hernia or abnormality. No abdominopelvic ascites.  Musculoskeletal: No acute or significant osseous findings.  IMPRESSION: 2.2 cm spiculated density is noted in right middle lobe concerning for malignancy. CT scan of the chest with intravenous contrast is recommended for further evaluation. These results will be called to the ordering clinician or representative by the Radiologist Assistant, and communication documented in the PACS or zVision Dashboard.  Aortic Atherosclerosis (ICD10-I70.0).   Electronically Signed   By: Marijo Conception, M.D.   On: 11/10/2017 13:58

## 2017-11-10 NOTE — Telephone Encounter (Signed)
Patient will be seen in clinic on Friday for repeat labs. Her creatinine was elevated yesterday. If improved, we will plan on proceeding with the recommended chest CT. If not, I will regroup with Dr. Mike Gip and determine an alternate plan.

## 2017-11-12 ENCOUNTER — Inpatient Hospital Stay (HOSPITAL_BASED_OUTPATIENT_CLINIC_OR_DEPARTMENT_OTHER): Payer: BLUE CROSS/BLUE SHIELD | Admitting: Oncology

## 2017-11-12 ENCOUNTER — Telehealth: Payer: Self-pay | Admitting: *Deleted

## 2017-11-12 ENCOUNTER — Inpatient Hospital Stay: Payer: BLUE CROSS/BLUE SHIELD

## 2017-11-12 ENCOUNTER — Other Ambulatory Visit: Payer: Self-pay | Admitting: Oncology

## 2017-11-12 VITALS — BP 128/76 | HR 96 | Wt 166.6 lb

## 2017-11-12 DIAGNOSIS — R918 Other nonspecific abnormal finding of lung field: Secondary | ICD-10-CM

## 2017-11-12 DIAGNOSIS — D591 Autoimmune hemolytic anemia, unspecified: Secondary | ICD-10-CM

## 2017-11-12 DIAGNOSIS — N179 Acute kidney failure, unspecified: Secondary | ICD-10-CM

## 2017-11-12 DIAGNOSIS — D5912 Cold autoimmune hemolytic anemia: Secondary | ICD-10-CM

## 2017-11-12 DIAGNOSIS — D649 Anemia, unspecified: Secondary | ICD-10-CM

## 2017-11-12 LAB — COMPREHENSIVE METABOLIC PANEL
ALT: 18 U/L (ref 14–54)
AST: 19 U/L (ref 15–41)
Albumin: 4 g/dL (ref 3.5–5.0)
Alkaline Phosphatase: 147 U/L — ABNORMAL HIGH (ref 38–126)
Anion gap: 8 (ref 5–15)
BUN: 28 mg/dL — ABNORMAL HIGH (ref 6–20)
CO2: 24 mmol/L (ref 22–32)
Calcium: 9.4 mg/dL (ref 8.9–10.3)
Chloride: 105 mmol/L (ref 101–111)
Creatinine, Ser: 1.38 mg/dL — ABNORMAL HIGH (ref 0.44–1.00)
GFR calc Af Amer: 47 mL/min — ABNORMAL LOW (ref >60)
GFR calc non Af Amer: 41 mL/min — ABNORMAL LOW (ref >60)
Glucose, Bld: 101 mg/dL — ABNORMAL HIGH (ref 65–99)
Potassium: 4.5 mmol/L (ref 3.5–5.1)
Sodium: 137 mmol/L (ref 135–145)
Total Bilirubin: 1.3 mg/dL — ABNORMAL HIGH (ref 0.3–1.2)
Total Protein: 7.8 g/dL (ref 6.5–8.1)

## 2017-11-12 LAB — LACTATE DEHYDROGENASE: LDH: 217 U/L — ABNORMAL HIGH (ref 98–192)

## 2017-11-12 LAB — CBC WITH DIFFERENTIAL/PLATELET
Basophils Absolute: 0.1 10*3/uL (ref 0–0.1)
Basophils Relative: 1 %
Eosinophils Absolute: 0.2 10*3/uL (ref 0–0.7)
Eosinophils Relative: 4 %
HCT: 27.5 % — ABNORMAL LOW (ref 35.0–47.0)
Hemoglobin: 9.3 g/dL — ABNORMAL LOW (ref 12.0–16.0)
Lymphocytes Relative: 15 %
Lymphs Abs: 0.8 10*3/uL — ABNORMAL LOW (ref 1.0–3.6)
MCH: 32.5 pg (ref 26.0–34.0)
MCHC: 33.7 g/dL (ref 32.0–36.0)
MCV: 96.4 fL (ref 80.0–100.0)
Monocytes Absolute: 0.3 10*3/uL (ref 0.2–0.9)
Monocytes Relative: 6 %
Neutro Abs: 3.7 10*3/uL (ref 1.4–6.5)
Neutrophils Relative %: 74 %
Platelets: 170 10*3/uL (ref 150–440)
RBC: 2.85 MIL/uL — ABNORMAL LOW (ref 3.80–5.20)
RDW: 15.5 % — ABNORMAL HIGH (ref 11.5–14.5)
WBC: 5.1 10*3/uL (ref 3.6–11.0)

## 2017-11-12 LAB — URIC ACID: Uric Acid, Serum: 5.7 mg/dL (ref 2.3–6.6)

## 2017-11-12 NOTE — Telephone Encounter (Signed)
Called patient and made her aware of her CT appt Date and time change of her CT scan scheduled on 11/16/17 @1 :30

## 2017-11-12 NOTE — Progress Notes (Signed)
[facility] day:  11/12/2017  Chief Complaint: Tina Page is a 62 y.o. female with a h/o cold hemolytic anemia and previous history of hepatitis B and C who is seen for assessment of newly diagnosed warm autoimmune hemolytic anemia with elevated liver enzymes, kidney function and uric acid level.  HPI:  The patient was last seen by primary hematologist, Dr. Mike Gip on 11/09/2017. At that time she complained of dizziness but otherwise felt well. Her exam was unremarkable. Hemoglobin was 9.1. Creatinine 1.85. Uric acid 6.7 (elevated). AST was 18 and ALT was 29 alkaline phosphatase 182. Total bilirubin was 0.7.  Previously prescribed Allopurinol 300 mg daily was dose reduced to 100 mg daily for elevated kidney function.  CT scan abdomen and pelvis was scheduled for 11/10/2017.  She was to return on Friday for labs and results from CT scan.   CT abdomen/pelvis wo contrast revealed a spiculated density right middle lobe mass concerning for malignancy. It was recommended that patient have a dedicated CT chest with contrast.   During the interim, patient has been feeling "well". She does note continued dizziness but is unchanged from last visit. She also notes a decline in her appetite but she has been able to drink fluids and supplements such as boosts. She no longer is having diarrhea and she denies any pain. Her weight continues to trend down with 3 pound weight loss in 3 days.   Past Medical History:  Diagnosis Date  . Anemia   . COPD (chronic obstructive pulmonary disease) ([place])   . HLD (hyperlipidemia)   . Hypertension     Past Surgical History:  Procedure Laterality Date  . ESOPHAGOGASTRODUODENOSCOPY (EGD) WITH PROPOFOL N/A 07/08/2016   Procedure: ESOPHAGOGASTRODUODENOSCOPY (EGD) WITH PROPOFOL;  Surgeon: Manya Silvas, MD;  Location: [facility] ENDOSCOPY;  Service: Endoscopy;  Laterality: N/A;  . KNEE SURGERY    . PERIPHERAL VASCULAR  CATHETERIZATION N/A 07/10/2016   Procedure: Lower Extremity Angiography;  Surgeon: Katha Cabal, MD;  Location: [place] [facility];  Service: Cardiovascular;  Laterality: N/A;  . PERIPHERAL VASCULAR CATHETERIZATION N/A 07/10/2016   Procedure: Abdominal Aortogram w/Lower Extremity;  Surgeon: Katha Cabal, MD;  Location: [place] [facility];  Service: Cardiovascular;  Laterality: N/A;  . PERIPHERAL VASCULAR CATHETERIZATION  07/10/2016   Procedure: Lower Extremity Intervention;  Surgeon: Katha Cabal, MD;  Location: [place] [facility];  Service: Cardiovascular;;    Family History  Problem Relation Age of Onset  . Diabetes Mother   . Hypertension Mother   . Diabetes Maternal Grandfather   . Hypertension Maternal Grandfather     Social History:  reports that she quit smoking about 16 months ago. Her smoking use included cigarettes. She smoked 0.50 packs per day. she has never used smokeless tobacco. She reports that she does not drink alcohol or use drugs.  The patient works at [employer]  She is exposed to cold temperatures.  She works 3rd shift.  She has a daughter, [name] and a daughter named [name].  She is alone today.   Allergies:  Allergies  Allergen Reactions  . Codeine Anaphylaxis    Current Medications: Current Outpatient Medications  Medication Sig Dispense Refill  . albuterol (PROVENTIL HFA;VENTOLIN HFA) 108 (90 Base) MCG/ACT inhaler Inhale into the lungs.    [name] Kitchen allopurinol (ZYLOPRIM) 100 MG tablet Take 1 tablet (100 mg total) by mouth daily. 30 tablet 0  . allopurinol (ZYLOPRIM) 300 MG tablet Take  1 tablet (300 mg total) by mouth daily. 30 tablet 1  . clopidogrel (PLAVIX) 75 MG tablet Take 1 tablet (75 mg total) by mouth daily. 30 tablet 5  . cyanocobalamin 500 MCG tablet Take 500 mcg by mouth daily.    [name] Kitchen docusate sodium (COLACE) 100 MG capsule Take 1 capsule (100 mg total) by mouth 2 (two) times daily. 10 capsule 0  . folic acid (FOLVITE) 1  MG tablet TAKE 1 TABLET (1 MG TOTAL) BY MOUTH DAILY. 90 tablet 1  . hydrochlorothiazide (HYDRODIURIL) 25 MG tablet Take 25 mg by mouth daily.    [name] Kitchen levothyroxine (SYNTHROID, LEVOTHROID) 25 MCG tablet Take 25 mcg by mouth daily.    [name] Kitchen lisinopril (PRINIVIL,ZESTRIL) 40 MG tablet Take 40 mg by mouth daily.    [name] Kitchen omeprazole (PRILOSEC) 20 MG capsule Take 1 capsule (20 mg total) by mouth daily. 60 capsule 0  . potassium chloride (KLOR-CON) 20 MEQ packet Take 20 mEq by mouth daily.    . pravastatin (PRAVACHOL) 40 MG tablet Take 40 mg by mouth every evening.     No current facility-administered medications for this visit.     Review of Systems:  Review of Systems  Constitutional: Positive for weight loss (3 additional pounds). Negative for chills, fever and malaise/fatigue.  HENT: Negative for congestion and ear pain.   Eyes: Negative.  Negative for blurred vision and double vision.  Respiratory: Negative.  Negative for cough, sputum production and shortness of breath.   Cardiovascular: Negative.  Negative for chest pain, palpitations and leg swelling.  Gastrointestinal: Positive for diarrhea (no loose stools for 2 days). Negative for abdominal pain, constipation, nausea and vomiting.  Genitourinary: Negative for dysuria, frequency and urgency.  Musculoskeletal: Negative for back pain and falls.  Skin: Negative.  Negative for rash.  Neurological: Positive for dizziness (When changing positions). Negative for weakness and headaches.  Endo/Heme/Allergies: Negative.  Does not bruise/bleed easily.  Psychiatric/Behavioral: Negative.  Negative for depression. The patient is not nervous/anxious and does not have insomnia.     Physical Exam: Blood pressure 128/76, pulse 96, weight 166 lb 9.6 oz (75.6 kg).  Physical Exam  Constitutional: She is oriented to person, place, and time and well-developed, well-nourished, and in no distress. Vital signs are normal.  Rechecked orthostatic vital signs. Patient is  not orthostatic at this visit.  HENT:  Head: Normocephalic and atraumatic.  Eyes: Pupils are equal, round, and reactive to light.  Neck: Normal range of motion.  Cardiovascular: Normal rate, regular rhythm and normal heart sounds.  No murmur heard. Pulmonary/Chest: Effort normal and breath sounds normal. She has no wheezes.  Abdominal: Soft. Normal appearance and bowel sounds are normal. She exhibits no distension. There is no tenderness.  Musculoskeletal: Normal range of motion. She exhibits no edema.  Neurological: She is alert and oriented to person, place, and time. Gait normal.  Skin: Skin is warm and dry. No rash noted.  Psychiatric: Mood, memory, affect and judgment normal.    Appointment on 11/12/2017  Component Date Value Ref Range Status  . WBC 11/12/2017 5.1  3.6 - 11.0 K/uL Final  . RBC 11/12/2017 2.85* 3.80 - 5.20 MIL/uL Final  . Hemoglobin 11/12/2017 9.3* 12.0 - 16.0 g/dL Final  . HCT 11/12/2017 27.5* 35.0 - 47.0 % Final  . MCV 11/12/2017 96.4  80.0 - 100.0 fL Final  . MCH 11/12/2017 32.5  26.0 - 34.0 pg Final  . MCHC 11/12/2017 33.7  32.0 - 36.0 g/dL Final  . RDW 11/12/2017  15.5* 11.5 - 14.5 % Final  . Platelets 11/12/2017 170  150 - 440 K/uL Final  . Neutrophils Relative % 11/12/2017 74  % Final  . Neutro Abs 11/12/2017 3.7  1.4 - 6.5 K/uL Final  . Lymphocytes Relative 11/12/2017 15  % Final  . Lymphs Abs 11/12/2017 0.8* 1.0 - 3.6 K/uL Final  . Monocytes Relative 11/12/2017 6  % Final  . Monocytes Absolute 11/12/2017 0.3  0.2 - 0.9 K/uL Final  . Eosinophils Relative 11/12/2017 4  % Final  . Eosinophils Absolute 11/12/2017 0.2  0 - 0.7 K/uL Final  . Basophils Relative 11/12/2017 1  % Final  . Basophils Absolute 11/12/2017 0.1  0 - 0.1 K/uL Final   Performed at [facility], [address] [place], [place] [address]  . Sodium 11/12/2017 137  135 - 145 mmol/L Final  . Potassium 11/12/2017 4.5  3.5 - 5.1 mmol/L Final  . Chloride 11/12/2017 105  101 - 111  mmol/L Final  . CO2 11/12/2017 24  22 - 32 mmol/L Final  . Glucose, Bld 11/12/2017 101* 65 - 99 mg/dL Final  . BUN 11/12/2017 28* 6 - 20 mg/dL Final  . Creatinine, Ser 11/12/2017 1.38* 0.44 - 1.00 mg/dL Final  . Calcium 11/12/2017 9.4  8.9 - 10.3 mg/dL Final  . Total Protein 11/12/2017 7.8  6.5 - 8.1 g/dL Final  . Albumin 11/12/2017 4.0  3.5 - 5.0 g/dL Final  . AST 11/12/2017 19  15 - 41 U/L Final  . ALT 11/12/2017 18  14 - 54 U/L Final  . Alkaline Phosphatase 11/12/2017 147* 38 - 126 U/L Final  . Total Bilirubin 11/12/2017 1.3* 0.3 - 1.2 mg/dL Final  . GFR calc non Af Amer 11/12/2017 41* >60 mL/min Final  . GFR calc Af Amer 11/12/2017 47* >60 mL/min Final   Comment: (NOTE) The eGFR has been calculated using the CKD EPI equation. This calculation has not been validated in all clinical situations. eGFR's persistently <60 mL/min signify possible Chronic Kidney Disease.   Georgiann Hahn gap 11/12/2017 8  5 - 15 Final   Performed at [facility], [address] [place], [place] [address]  . Uric Acid, Serum 11/12/2017 5.7  2.3 - 6.6 mg/dL Final   Performed at [facility], [address] [place], [place] [address]  . 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 One week prior to presentation, she felt like she was coming down with something. She denied any fever, runny nose, sore throat or cough. She had some diarrhea. She denied any new medications or herbal products.  Anemia workup revealed a cold autoantibody (IgG and complement).  Reticulocyte count was 11.3%.  Ferritin was 562.  Iron studies revealed a saturation of 20% and a TIBC of 214 (low).  B12 was 231 (low normal).  Folate was 42.   Peripheral smear revealed rouleaux  formation.  Additional testing included the following + studies:  hepatitis C antibody, hepatitis B core antibody, CMV IgM, and EBV VCA (IgM and IgG).  Hepatitis B by PCR was negative.  Hepatitis C RNA was negative.  Mycoplasma pneumonia IgM was negative.  Reticulocyte count was 11.3% (high) indicating appropriate marrow response.  C3 and C4 were  normal.  Cold agglutinin titer was negative x 2.  Negative studies included:  ANA, hepatitis B surface antigen, SPEP, and free light chain ratio.   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  MMA was initially 330 (normal).  Repeat MMA was elevated on 08/01/2016 confirming B12 deficiency.  Hepatitis B surface antibody was positive and hepatitis B surface antigen was negative on 08/06/2016.  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 There was no adenopathy. Spleen was normal.  Abdominal ultrasound on 04/15/2017 revealed a normal spleen and sludge in the gallbladder.  The liver was echogenic consistent fatty infiltration and/or hepatocellular disease.  She underwent PTCA and stent placement in right and left common iliac arteries and left external iliac artery on 07/10/2016.  She is on Plavix.  EGD on 03/19/2015 revealed gastritis in the body and antrum. Colonoscopy on 03/19/2015 revealed one hyperplastic polyp. EGD on 07/08/2016 was normal.  No evidence of bleeding.  She has received 6 units of warmed PRBCs to date (last on 08/01/2016). If Rituxan is needed, she requires entecavir 0.5 mg q day beginning 2 weeks prior to Rituxan.  In addition, hepatitis B viral level and LFTs should be checked monthly.  She began steroids (1 mg/kg) on 08/01/2016.  She stopped prednisone on 03/12/2017.  She is on folic acid 1 mg a day.  She received B12 1000 mcg IM  on 08/06/2016.  She is on oral B12.  B12 and folate were normal on 03/09/2017.  She developed peri-oral and intranasal herpes simplex-1.  She was treated with valacyclovir and doxycycline on 10/19/2016.  She developed a transient elevated alkaline phosphatase on 10/19/2016 which subsequently normalized.  She was documented to have a warm autoantibody on 10/12/2017.  LDH and bilirubin were normal.  Hematocrit had improved from prior testing.    She developed flu like symptoms on 10/26/2017.  Symptoms included cough, myalgias, and fever (tmax unknown).  She was prescribed Mucinex, Tamiflu, and amoxicillin.  She only took amoxicillin x 5 days.  She developed increased liver function tests on 11/02/2017.  She developed increased uric acid levels. Subsequently, she was started on 300 mg Allpurinol daily  She developed elevated kidney numbers on 11/09/17.   Allopurinol was dose reduced to 100 mg daily on 11/09/17.   CT abdomen/pelvis scheduled for 11/10/17 revealed 2.2 cm spiculated density of the right middle lobe concerning for malignancy.  Symptomatically, she denies any B symptoms. Her exam remains unremarkable with no adenopathy or hepatosplenomegaly.She continues to complain of dizziness but denies diarrhea. Hemoglobin was 9.3. Platelet count was 170. LFTs have her return to normal. Creatinine decreased to 1.38. LDH is 217 and total bili is 1.3.  Plan: 1. Labs today: CBC with differential, CMP, LDH, uric acid 2. Reviewed CT abdomen with contrast. Spoke about incidental spiculated mass found by CT abdomen/Pelvis.  3. Reviewed labs with patient in detail. kidney function improving, LFT's normalizing, Uric acid decreasing  4. Discussed Allpurinol dose. Patient has not begun her dose reduced Allpuribnol at this time. She states "I forgot to pick up the new prescription". She assures me she will pick the prescription up today and begin her new dose of 100 mg aluprinol daily.  5. Assessed patient for  orthostatics given continued dizziness. No orthostatic changes. Encouraged her to continue with increased fluid intake which appears to be helping "  some".  6. Appetite changes/weight loss: Encouraged her to continue protein supplements.  7. Patient is to return on Monday for labs (CBC. CMET, Uric Acid, LDH) to see the kidney function continues to improve. 8. At that time, we will reevaluate patient to see if dedicated CT of chest for new findings is possible. At this time, it is unsafe for patient to have CT with contrast.  Greater than 50% was spent in counseling and coordination of care with this patient including but not limited to discussion of the relevant topics above (See A&P) including, but not limited to diagnosis and management of acute and chronic medical conditions.   Faythe Casa, NP 11/12/2017 11:06 AM

## 2017-11-15 ENCOUNTER — Inpatient Hospital Stay: Payer: BLUE CROSS/BLUE SHIELD

## 2017-11-15 ENCOUNTER — Other Ambulatory Visit: Payer: Self-pay

## 2017-11-15 ENCOUNTER — Other Ambulatory Visit: Payer: Self-pay | Admitting: Oncology

## 2017-11-15 ENCOUNTER — Ambulatory Visit: Payer: BLUE CROSS/BLUE SHIELD

## 2017-11-15 ENCOUNTER — Telehealth: Payer: Self-pay | Admitting: Oncology

## 2017-11-15 DIAGNOSIS — D591 Other autoimmune hemolytic anemias: Principal | ICD-10-CM

## 2017-11-15 DIAGNOSIS — D5912 Cold autoimmune hemolytic anemia: Secondary | ICD-10-CM

## 2017-11-15 DIAGNOSIS — D649 Anemia, unspecified: Secondary | ICD-10-CM

## 2017-11-15 LAB — COMPREHENSIVE METABOLIC PANEL
ALK PHOS: 122 U/L (ref 38–126)
ALT: 13 U/L — ABNORMAL LOW (ref 14–54)
ANION GAP: 12 (ref 5–15)
AST: 19 U/L (ref 15–41)
Albumin: 4 g/dL (ref 3.5–5.0)
BUN: 39 mg/dL — ABNORMAL HIGH (ref 6–20)
CALCIUM: 8.9 mg/dL (ref 8.9–10.3)
CHLORIDE: 102 mmol/L (ref 101–111)
CO2: 19 mmol/L — AB (ref 22–32)
Creatinine, Ser: 1.74 mg/dL — ABNORMAL HIGH (ref 0.44–1.00)
GFR calc non Af Amer: 31 mL/min — ABNORMAL LOW (ref >60)
GFR, EST AFRICAN AMERICAN: 36 mL/min — AB (ref >60)
Glucose, Bld: 92 mg/dL (ref 65–99)
Potassium: 4.6 mmol/L (ref 3.5–5.1)
SODIUM: 133 mmol/L — AB (ref 135–145)
Total Bilirubin: 1.2 mg/dL (ref 0.3–1.2)
Total Protein: 7.7 g/dL (ref 6.5–8.1)

## 2017-11-15 LAB — URINALYSIS, COMPLETE (UACMP) WITH MICROSCOPIC
BILIRUBIN URINE: NEGATIVE
Bacteria, UA: NONE SEEN
Glucose, UA: NEGATIVE mg/dL
HGB URINE DIPSTICK: NEGATIVE
Ketones, ur: NEGATIVE mg/dL
LEUKOCYTES UA: NEGATIVE
Nitrite: NEGATIVE
PROTEIN: NEGATIVE mg/dL
Specific Gravity, Urine: 1.011 (ref 1.005–1.030)
pH: 5 (ref 5.0–8.0)

## 2017-11-15 LAB — URIC ACID: Uric Acid, Serum: 6 mg/dL (ref 2.3–6.6)

## 2017-11-15 NOTE — Progress Notes (Signed)
Tina Page,  Can we change this patient's CT scan scheduled tomorrow from with contrast to wo please. I placed the new order. Let me know if we can keep the same time or if that needs to be changed. Her kidney function has not improved and it is unsafe to give her contrast at this time. Thanks.   Rulon Abide

## 2017-11-15 NOTE — Telephone Encounter (Signed)
Patient's appointment change from 1:30 tomorrow to 2 PM. Order changed from CT with contrast to CT without contrast due to elevated kidney function. Patient made aware of changes.   Faythe Casa, NP 11/15/2017 4:10 PM

## 2017-11-16 ENCOUNTER — Ambulatory Visit: Payer: BLUE CROSS/BLUE SHIELD

## 2017-11-16 ENCOUNTER — Ambulatory Visit: Admission: RE | Admit: 2017-11-16 | Payer: BLUE CROSS/BLUE SHIELD | Source: Ambulatory Visit

## 2017-11-16 ENCOUNTER — Ambulatory Visit
Admission: RE | Admit: 2017-11-16 | Discharge: 2017-11-16 | Disposition: A | Discharge status: Home / Self Care | Payer: BLUE CROSS/BLUE SHIELD | Source: Ambulatory Visit | Attending: Oncology | Admitting: Oncology

## 2017-11-16 ENCOUNTER — Other Ambulatory Visit: Payer: Self-pay | Admitting: *Deleted

## 2017-11-16 DIAGNOSIS — D5912 Cold autoimmune hemolytic anemia: Secondary | ICD-10-CM

## 2017-11-16 DIAGNOSIS — D591 Autoimmune hemolytic anemia, unspecified: Secondary | ICD-10-CM

## 2017-11-16 DIAGNOSIS — I700 Atherosclerosis of aorta: Secondary | ICD-10-CM | POA: Diagnosis not present

## 2017-11-16 DIAGNOSIS — J439 Emphysema, unspecified: Secondary | ICD-10-CM | POA: Insufficient documentation

## 2017-11-16 DIAGNOSIS — R918 Other nonspecific abnormal finding of lung field: Secondary | ICD-10-CM | POA: Insufficient documentation

## 2017-11-16 LAB — EPSTEIN-BARR VIRUS VCA ANTIBODY PANEL
EBV Early Antigen Ab, IgG: >150 U/mL — ABNORMAL HIGH (ref 0.0–8.9)
EBV NA IgG: >600 U/mL — ABNORMAL HIGH (ref 0.0–17.9)
EBV VCA IgG: >600 U/mL — ABNORMAL HIGH (ref 0.0–17.9)
EBV VCA IgM: <36 U/mL (ref 0.0–35.9)

## 2017-11-16 LAB — CMV ANTIBODY, IGG (EIA)

## 2017-11-16 NOTE — Progress Notes (Signed)
Yes ma'am they did. I will forward. Thanks again.

## 2017-11-16 NOTE — Progress Notes (Signed)
Would you like me to send these results to GI?   Thanks,   Rulon Abide, NP

## 2017-11-17 ENCOUNTER — Inpatient Hospital Stay (HOSPITAL_BASED_OUTPATIENT_CLINIC_OR_DEPARTMENT_OTHER): Payer: BLUE CROSS/BLUE SHIELD | Admitting: Hematology and Oncology

## 2017-11-17 ENCOUNTER — Inpatient Hospital Stay: Payer: BLUE CROSS/BLUE SHIELD

## 2017-11-17 ENCOUNTER — Other Ambulatory Visit: Payer: Self-pay | Admitting: Urgent Care

## 2017-11-17 VITALS — BP 126/67 | HR 97 | Temp 96.7°F | Resp 18 | Wt 164.7 lb

## 2017-11-17 DIAGNOSIS — Z87891 Personal history of nicotine dependence: Secondary | ICD-10-CM

## 2017-11-17 DIAGNOSIS — N289 Disorder of kidney and ureter, unspecified: Secondary | ICD-10-CM

## 2017-11-17 DIAGNOSIS — R918 Other nonspecific abnormal finding of lung field: Secondary | ICD-10-CM

## 2017-11-17 DIAGNOSIS — D591 Autoimmune hemolytic anemia, unspecified: Secondary | ICD-10-CM

## 2017-11-17 DIAGNOSIS — B1910 Unspecified viral hepatitis B without hepatic coma: Secondary | ICD-10-CM | POA: Diagnosis not present

## 2017-11-17 DIAGNOSIS — B1920 Unspecified viral hepatitis C without hepatic coma: Secondary | ICD-10-CM | POA: Diagnosis not present

## 2017-11-17 DIAGNOSIS — R945 Abnormal results of liver function studies: Secondary | ICD-10-CM

## 2017-11-17 DIAGNOSIS — D599 Acquired hemolytic anemia, unspecified: Secondary | ICD-10-CM

## 2017-11-17 DIAGNOSIS — D649 Anemia, unspecified: Secondary | ICD-10-CM

## 2017-11-17 DIAGNOSIS — D5912 Cold autoimmune hemolytic anemia: Secondary | ICD-10-CM

## 2017-11-17 DIAGNOSIS — R7989 Other specified abnormal findings of blood chemistry: Secondary | ICD-10-CM

## 2017-11-17 LAB — CBC WITH DIFFERENTIAL/PLATELET
Basophils Absolute: 0.1 10*3/uL (ref 0–0.1)
Basophils Relative: 3 %
Eosinophils Absolute: 0.2 10*3/uL (ref 0–0.7)
Eosinophils Relative: 6 %
HCT: 23.6 % — ABNORMAL LOW (ref 35.0–47.0)
Hemoglobin: 7.9 g/dL — ABNORMAL LOW (ref 12.0–16.0)
Lymphocytes Relative: 23 %
Lymphs Abs: 0.9 10*3/uL — ABNORMAL LOW (ref 1.0–3.6)
MCH: 31.9 pg (ref 26.0–34.0)
MCHC: 33.5 g/dL (ref 32.0–36.0)
MCV: 95.2 fL (ref 80.0–100.0)
Monocytes Absolute: 0.2 10*3/uL (ref 0.2–0.9)
Monocytes Relative: 5 %
Neutro Abs: 2.5 10*3/uL (ref 1.4–6.5)
Neutrophils Relative %: 63 %
Platelets: 157 10*3/uL (ref 150–440)
RBC: 2.48 MIL/uL — ABNORMAL LOW (ref 3.80–5.20)
RDW: 16.1 % — ABNORMAL HIGH (ref 11.5–14.5)
WBC: 4 10*3/uL (ref 3.6–11.0)

## 2017-11-17 LAB — COMPREHENSIVE METABOLIC PANEL
ALT: 11 U/L — ABNORMAL LOW (ref 14–54)
AST: 16 U/L (ref 15–41)
Albumin: 3.8 g/dL (ref 3.5–5.0)
Alkaline Phosphatase: 118 U/L (ref 38–126)
Anion gap: 8 (ref 5–15)
BUN: 39 mg/dL — ABNORMAL HIGH (ref 6–20)
CO2: 23 mmol/L (ref 22–32)
Calcium: 8.9 mg/dL (ref 8.9–10.3)
Chloride: 104 mmol/L (ref 101–111)
Creatinine, Ser: 1.66 mg/dL — ABNORMAL HIGH (ref 0.44–1.00)
GFR calc Af Amer: 38 mL/min — ABNORMAL LOW (ref >60)
GFR calc non Af Amer: 33 mL/min — ABNORMAL LOW (ref >60)
Glucose, Bld: 98 mg/dL (ref 65–99)
Potassium: 4.6 mmol/L (ref 3.5–5.1)
Sodium: 135 mmol/L (ref 135–145)
Total Bilirubin: 1.5 mg/dL — ABNORMAL HIGH (ref 0.3–1.2)
Total Protein: 7.5 g/dL (ref 6.5–8.1)

## 2017-11-17 LAB — LACTATE DEHYDROGENASE: LDH: 254 U/L — ABNORMAL HIGH (ref 98–192)

## 2017-11-17 NOTE — Progress Notes (Signed)
Good Morning,   I wanted to relay the labs you and Dr. Angelica Chessman added for this patient. Let me know if you need anything further from [name]. Thanks.  Rulon Abide, NP

## 2017-11-17 NOTE — Progress Notes (Signed)
Patient offers no complaints today. 

## 2017-11-17 NOTE — Progress Notes (Signed)
[facility] day:  11/17/2017  Chief Complaint: Tina Page is a 62 y.o. female with a h/o cold hemolytic anemia, recent warm autoimmune hemolytic anemia, hepatitis B and C who is seen for review of interval scans and discussion regarding direction of therapy.  HPI:  The patient was last seen in the hematology clinic on 11/09/2017.  At that time, she denied any B symptoms.  Exam was unremarkable with no adenopathy or hepatosplenomegaly.  She had vertigo and loose stools.  Hemoglobin was 9.1.  Platelet count was 164,000.  LFTs were improving.  Creatinine was 1.85.  LDH was 205 and bilirubin 0.7.  Retic was 7.3%.  Urinalysis on 11/09/2017 revealed revealed no hemoglobin, bilirubin or active sediment.  Abdomen/pelvic CT scan to r/o adenopathy was scheduled.  She was referred to Dr. Precious Reel of rheumatology.  Abdomen and pelvic CT on 11/10/2017 revealed a 2.2 cm spiculated density in right middle lobe concerning  for malignancy.   Chest CT on 11/16/2017 revealed a 2.2 x 2.0 x 2.4 spiculated RIGHT middle lobe mass suspicious for a primary pulmonary neoplasm.  There was tiny nonspecific upper lobe pulmonary nodules greater on RIGHT, largest 3 mm, of uncertain etiology; these could be postinflammatory but tiny metastatic foci are not excluded.  There was additional 8 x 8 mm opacity in the posterior sulcus of the RIGHT lower lobe, favor atelectasis though tumor not completely excluded, recommend attention on follow-up imaging.  There was question cholelithiasis as well as coronary arterial calcifications, aortic atherosclerosis, and emphysema .  She was seen by Dr. Precious Reel on 11/10/2017.  Recommendations were for nephrology consult.  C3 was 161and C4 were 29 (normal).  Anti-smooth muscle antibody was 39 (high) and suggestive of autoimmune hepatitis.  Dr Gustavo Lah recommended checking EBV panel, CMV, and urinalysis.  Creatinine was 1.17 on  11/05/2017, 1.85 on 11/09/2017, 1.38 on 11/12/2017, and 1.74 on 11/15/2017.  Urinalysis on 11/15/2017 revealed revealed no hemoglobin, bilirubin or active sediment.  CMV IgG was positive.  EBV VCA IgG, NA IgG, early antigen antibody IgG were elevated.  EBV VCA IgM was < 36.  Testing was c/w a convalescence/past infection or reactivated infection.   CBC on 11/12/2017 revealed a hematocrit of 27.5, hemoglobin 9.3, MCV 96.4, platelets 170,000, WBC 5100 with an Bono of 3700.  Symptomatically, patient is doing "ok". Patient has no acute symptoms today. She denies chest pain and shortness of breath. Patient has had no B symptoms or recent infections.  Patient is eating well.  Her weight is down 2 pounds. Patient denies pain in the clinic today.   She has a 21 pack year smoking history (1/2 pack/day from age [age]).  Her great grandfather died of lung cancer.    Past Medical History:  Diagnosis Date  . Anemia   . COPD (chronic obstructive pulmonary disease) ([place])   . HLD (hyperlipidemia)   . Hypertension     Past Surgical History:  Procedure Laterality Date  . ESOPHAGOGASTRODUODENOSCOPY (EGD) WITH PROPOFOL N/A 07/08/2016   Procedure: ESOPHAGOGASTRODUODENOSCOPY (EGD) WITH PROPOFOL;  Surgeon: Manya Silvas, MD;  Location: [facility] ENDOSCOPY;  Service: Endoscopy;  Laterality: N/A;  . KNEE SURGERY    . PERIPHERAL VASCULAR CATHETERIZATION N/A 07/10/2016   Procedure: Lower Extremity Angiography;  Surgeon: Katha Cabal, MD;  Location: [place];  Service: Cardiovascular;  Laterality: N/A;  . PERIPHERAL VASCULAR CATHETERIZATION N/A 07/10/2016   Procedure: Abdominal Aortogram w/Lower Extremity;  Surgeon: Dolores Lory  Schnier, MD;  Location: [place];  Service: Cardiovascular;  Laterality: N/A;  . PERIPHERAL VASCULAR CATHETERIZATION  07/10/2016   Procedure: Lower Extremity Intervention;  Surgeon: Katha Cabal, MD;  Location: [facility];  Service: Cardiovascular;;     Family History  Problem Relation Age of Onset  . Diabetes Mother   . Hypertension Mother   . Diabetes Maternal Grandfather   . Hypertension Maternal Grandfather     Social History:  reports that she quit smoking about 16 months ago. Her smoking use included cigarettes. She smoked 0.50 packs per day. she has never used smokeless tobacco. She reports that she does not drink alcohol or use drugs.  She has a 21 pack year smoking history (1/2 pack/day from age [age]).  The patient works at [employer]  She is exposed to cold temperatures.  She works 3rd shift.  She has a daughter, [name] and a daughter named [name].  She is alone today.   Allergies:  Allergies  Allergen Reactions  . Codeine Anaphylaxis    Current Medications: Current Outpatient Medications  Medication Sig Dispense Refill  . albuterol (PROVENTIL HFA;VENTOLIN HFA) 108 (90 Base) MCG/ACT inhaler Inhale into the lungs.    [name] Kitchen allopurinol (ZYLOPRIM) 100 MG tablet Take 1 tablet (100 mg total) by mouth daily. 30 tablet 0  . allopurinol (ZYLOPRIM) 300 MG tablet Take 1 tablet (300 mg total) by mouth daily. 30 tablet 1  . clopidogrel (PLAVIX) 75 MG tablet Take 1 tablet (75 mg total) by mouth daily. 30 tablet 5  . cyanocobalamin 500 MCG tablet Take 500 mcg by mouth daily.    [name] Kitchen docusate sodium (COLACE) 100 MG capsule Take 1 capsule (100 mg total) by mouth 2 (two) times daily. 10 capsule 0  . folic acid (FOLVITE) 1 MG tablet TAKE 1 TABLET (1 MG TOTAL) BY MOUTH DAILY. 90 tablet 1  . hydrochlorothiazide (HYDRODIURIL) 25 MG tablet Take 25 mg by mouth daily.    [name] Kitchen levothyroxine (SYNTHROID, LEVOTHROID) 25 MCG tablet Take 25 mcg by mouth daily.    [name] Kitchen lisinopril (PRINIVIL,ZESTRIL) 40 MG tablet Take 40 mg by mouth daily.    [name] Kitchen omeprazole (PRILOSEC) 20 MG capsule Take 1 capsule (20 mg total) by mouth daily. 60 capsule 0  . potassium chloride (KLOR-CON) 20 MEQ packet Take 20 mEq by mouth daily.    . pravastatin (PRAVACHOL) 40 MG  tablet Take 40 mg by mouth every evening.     No current facility-administered medications for this visit.     Review of Systems:  GENERAL: Feels "ok".  No fevers, chills or sweats.  Weight down 2 pounds. PERFORMANCE STATUS (ECOG): 1 HEENT: No visual changes, sore throat, mouth sores or tenderness. Lungs: No shortness of breath or cough. No hemoptysis. Cardiac: No chest pain, palpitations, orthopnea, or PND. GI: No nausea, vomiting, constipation, melena or hematochezia. GU: No urgency, frequency, dysuria, or hematuria. Musculoskeletal: No back pain. No muscle tenderness. Extremities: No pain or swelling. Skin: Discoid lupus. No rashes, skin changes or ulcers. Neuro: No headache, weakness, balance or coordination issues. Endocrine: No diabetes, thyroid issues, hot flashes or night sweats. Psych: No mood changes, depression or anxiety. Pain:  No back pain. No joint pain. No muscle aches. Review of systems: All other systems reviewed and found to be negative.  Physical Exam: Blood pressure 126/67, pulse 97, temperature (!) 96.7 F (35.9 C), temperature source Tympanic, resp. rate 18, weight 164 lb 10.9 oz (74.7 kg), SpO2 100 %. GENERAL:Well  developed, well nourished, woman sitting comfortably in the exam room in no acute distress. MENTAL STATUS: Alert and oriented to person, place and time. HEAD:  [name] hair pulled back.  Normocephalic, atraumatic, face symmetric, no Cushingoid features. Checks full. EYES:Black rimmed glasses.  Blue eyes.  Pupils equal round and reactive to light and accomodation. No conjunctivitis or scleral icterus. WLN:[id number] clear without lesion. Tonguenormal. Mucous membranes moist. RESPIRATORY:Clear to auscultationwithout rales, wheezes or rhonchi. CARDIOVASCULAR:Regular rate andrhythmwithout murmur, rub or gallop. ABDOMEN:Soft, non-tender, with active bowel sounds, and no hepatosplenomegaly. No masses. SKIN: No petechiae or  ecchymosis. EXTREMITIES: No edema, no skin discoloration or tenderness. No palpable cords. LYMPHNODES: No palpable cervical, supraclavicular, axillary or inguinal adenopathy  NEUROLOGICAL: Unremarkable   Appointment on 11/17/2017  Component Date Value Ref Range Status  . Sodium 11/17/2017 135  135 - 145 mmol/L Final  . Potassium 11/17/2017 4.6  3.5 - 5.1 mmol/L Final  . Chloride 11/17/2017 104  101 - 111 mmol/L Final  . CO2 11/17/2017 23  22 - 32 mmol/L Final  . Glucose, Bld 11/17/2017 98  65 - 99 mg/dL Final  . BUN 11/17/2017 39* 6 - 20 mg/dL Final  . Creatinine, Ser 11/17/2017 1.66* 0.44 - 1.00 mg/dL Final  . Calcium 11/17/2017 8.9  8.9 - 10.3 mg/dL Final  . Total Protein 11/17/2017 7.5  6.5 - 8.1 g/dL Final  . Albumin 11/17/2017 3.8  3.5 - 5.0 g/dL Final  . AST 11/17/2017 16  15 - 41 U/L Final  . ALT 11/17/2017 11* 14 - 54 U/L Final  . Alkaline Phosphatase 11/17/2017 118  38 - 126 U/L Final  . Total Bilirubin 11/17/2017 1.5* 0.3 - 1.2 mg/dL Final  . GFR calc non Af Amer 11/17/2017 33* >60 mL/min Final  . GFR calc Af Amer 11/17/2017 38* >60 mL/min Final   Comment: (NOTE) The eGFR has been calculated using the CKD EPI equation. This calculation has not been validated in all clinical situations. eGFR's persistently <60 mL/min signify possible Chronic Kidney Disease.   Georgiann Hahn gap 11/17/2017 8  5 - 15 Final   Performed at [facility], [address] [place], [place] [address]  . WBC 11/17/2017 4.0  3.6 - 11.0 K/uL Final  . RBC 11/17/2017 2.48* 3.80 - 5.20 MIL/uL Final  . Hemoglobin 11/17/2017 7.9* 12.0 - 16.0 g/dL Final  . HCT 11/17/2017 23.6* 35.0 - 47.0 % Final  . MCV 11/17/2017 95.2  80.0 - 100.0 fL Final  . MCH 11/17/2017 31.9  26.0 - 34.0 pg Final  . MCHC 11/17/2017 33.5  32.0 - 36.0 g/dL Final  . RDW 11/17/2017 16.1* 11.5 - 14.5 % Final  . Platelets 11/17/2017 157  150 - 440 K/uL Final  . Neutrophils Relative % 11/17/2017 63  % Final  . Neutro Abs  11/17/2017 2.5  1.4 - 6.5 K/uL Final  . Lymphocytes Relative 11/17/2017 23  % Final  . Lymphs Abs 11/17/2017 0.9* 1.0 - 3.6 K/uL Final  . Monocytes Relative 11/17/2017 5  % Final  . Monocytes Absolute 11/17/2017 0.2  0.2 - 0.9 K/uL Final  . Eosinophils Relative 11/17/2017 6  % Final  . Eosinophils Absolute 11/17/2017 0.2  0 - 0.7 K/uL Final  . Basophils Relative 11/17/2017 3  % Final  . Basophils Absolute 11/17/2017 0.1  0 - 0.1 K/uL Final   Performed at [facility], [address] [place], [place] [address]  . LDH 11/17/2017 254* 98 - 192 U/L Final  Performed at [facility], [address], [place], [place] [address]  Orders Only on 11/15/2017  Component Date Value Ref Range Status  . Sodium 11/15/2017 133* 135 - 145 mmol/L Final  . Potassium 11/15/2017 4.6  3.5 - 5.1 mmol/L Final  . Chloride 11/15/2017 102  101 - 111 mmol/L Final  . CO2 11/15/2017 19* 22 - 32 mmol/L Final  . Glucose, Bld 11/15/2017 92  65 - 99 mg/dL Final  . BUN 11/15/2017 39* 6 - 20 mg/dL Final  . Creatinine, Ser 11/15/2017 1.74* 0.44 - 1.00 mg/dL Final  . Calcium 11/15/2017 8.9  8.9 - 10.3 mg/dL Final  . Total Protein 11/15/2017 7.7  6.5 - 8.1 g/dL Final  . Albumin 11/15/2017 4.0  3.5 - 5.0 g/dL Final  . AST 11/15/2017 19  15 - 41 U/L Final  . ALT 11/15/2017 13* 14 - 54 U/L Final  . Alkaline Phosphatase 11/15/2017 122  38 - 126 U/L Final  . Total Bilirubin 11/15/2017 1.2  0.3 - 1.2 mg/dL Final  . GFR calc non Af Amer 11/15/2017 31* >60 mL/min Final  . GFR calc Af Amer 11/15/2017 36* >60 mL/min Final   Comment: (NOTE) The eGFR has been calculated using the CKD EPI equation. This calculation has not been validated in all clinical situations. eGFR's persistently <60 mL/min signify possible Chronic Kidney Disease.   Georgiann Hahn gap 11/15/2017 12  5 - 15 Final   Performed at [facility], [address] [place], [place] [address]  Clinical Support on 11/15/2017  Component  Date Value Ref Range Status  . Uric Acid, Serum 11/15/2017 6.0  2.3 - 6.6 mg/dL Final   Performed at [facility], [address] [place], [place] [address]  . EBV VCA IgG 11/15/2017 >600.0* 0.0 - 17.9 U/mL Final   Comment: (NOTE)                                 Negative        <18.0                                 Equivocal 18.0 - 21.9                                 Positive        >21.9   . EBV VCA IgM 11/15/2017 <36.0  0.0 - 35.9 U/mL Final   Comment: (NOTE)                                 Negative        <36.0                                 Equivocal 36.0 - 43.9                                 Positive        >43.9   . EBV NA IgG 11/15/2017 >600.0* 0.0 - 17.9 U/mL Final   Comment: (NOTE)  Negative        <18.0                                 Equivocal 18.0 - 21.9                                 Positive        >21.9   . EBV Early Antigen Ab, IgG 11/15/2017 >150.0* 0.0 - 8.9 U/mL Final   Comment: (NOTE)                                 Negative        < 9.0                                 Equivocal  9.0 - 10.9                                 Positive        >10.9   . Interpretation: 11/15/2017 Comment   Final   Comment: (NOTE)               EBV Interpretation Chart Interpretation   EBV-IgM  EA(D)-IgG  VCA-IgG  EBNA-IgG EBV Seronegative    -        -         -          - Early Phase         +        -         -          - Acute Primary       +       +or-       +          - Infection Convalescence/Past  -       +or-       +          + Infection Reactivated        +or-      +         +          + Infection       + Antibody Present      - Antibody Absent Performed At: [facility] [place], [place] [id number] Rush Farmer MD [phone] Performed at [facility], [address], [place], [place] [address]   . CMV Ab - IgG 11/15/2017 >10.00* 0.00 - 0.59 U/mL Final   Comment: (NOTE)                                Negative          <0.60                               Equivocal   0.60 - 0.69  Positive          >0.69 Performed At: [facility] [place], [place] [id number] Rush Farmer MD OP:[id number] Performed at [facility], [address], [place], [place] [address]   . Color, Urine 11/15/2017 YELLOW* YELLOW Final  . APPearance 11/15/2017 CLEAR* CLEAR Final  . Specific Gravity, Urine 11/15/2017 1.011  1.005 - 1.030 Final  . pH 11/15/2017 5.0  5.0 - 8.0 Final  . Glucose, UA 11/15/2017 NEGATIVE  NEGATIVE mg/dL Final  . Hgb urine dipstick 11/15/2017 NEGATIVE  NEGATIVE Final  . Bilirubin Urine 11/15/2017 NEGATIVE  NEGATIVE Final  . Ketones, ur 11/15/2017 NEGATIVE  NEGATIVE mg/dL Final  . Protein, ur 11/15/2017 NEGATIVE  NEGATIVE mg/dL Final  . Nitrite 11/15/2017 NEGATIVE  NEGATIVE Final  . Leukocytes, UA 11/15/2017 NEGATIVE  NEGATIVE Final  . RBC / HPF 11/15/2017 0-5  0 - 5 RBC/hpf Final  . WBC, UA 11/15/2017 0-5  0 - 5 WBC/hpf Final  . Bacteria, UA 11/15/2017 NONE SEEN  NONE SEEN Final  . 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 One week prior to presentation, she felt like she was coming down with something. She denied any fever, runny nose, sore throat or cough. She had some diarrhea. She denied any new medications or herbal products.  Anemia workup revealed a cold autoantibody (IgG and complement).  Reticulocyte count was 11.3%.  Ferritin was 562.  Iron studies revealed a saturation of 20% and a TIBC of 214 (low).  B12 was 231 (low normal).  Folate was 42.   Peripheral smear revealed rouleaux formation.  Additional testing included the following + studies:   hepatitis C antibody, hepatitis B core antibody, CMV IgM, and EBV VCA (IgM and IgG).  Hepatitis B by PCR was negative.  Hepatitis C RNA was negative.  Mycoplasma pneumonia IgM was negative.  Reticulocyte count was 11.3% (high) indicating appropriate marrow response.  C3 and C4 were normal.  Cold agglutinin titer was negative x 2.  Negative studies included:  ANA, hepatitis B surface antigen, SPEP, and free light chain ratio.   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  MMA was initially 330 (normal).  Repeat MMA was elevated on 08/01/2016 confirming B12 deficiency.  Hepatitis B surface antibody was positive and hepatitis B surface antigen was negative on 08/06/2016.  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 There was no adenopathy. Spleen was normal.  Abdominal ultrasound on 04/15/2017 revealed a normal spleen and sludge in the gallbladder.  The liver was echogenic consistent fatty infiltration and/or hepatocellular disease.  She underwent PTCA and stent placement in right and left common iliac arteries and left external iliac artery on 07/10/2016.  She is on Plavix.  EGD on 03/19/2015 revealed gastritis in the body and antrum. Colonoscopy on 03/19/2015 revealed one hyperplastic polyp. EGD on 07/08/2016 was normal.  No evidence of bleeding.  She has received 6 units of warmed PRBCs to date (last on 08/01/2016). If Rituxan is needed, she requires entecavir 0.5 mg q day beginning 2 weeks prior to Rituxan.  In addition, hepatitis B viral level and LFTs should be checked monthly.  She began steroids (1 mg/kg) on 08/01/2016.  She stopped prednisone on 03/12/2017.  She is on folic acid 1 mg a day.  She received B12 1000 mcg IM on 08/06/2016.  She is on oral B12.  B12 and folate were normal on  03/09/2017.  She developed peri-oral and intranasal herpes simplex-1.  She was treated with valacyclovir and doxycycline on 10/19/2016.  She developed a transient elevated alkaline phosphatase on 10/19/2016 which subsequently normalized.  She was documented to have a warm autoantibody on 10/12/2017.  LDH and bilirubin were normal.  Hematocrit had improved from prior testing.    She developed flu like symptoms on 10/26/2017.  Symptoms included cough, myalgias, and fever (tmax unknown).  She was prescribed Mucinex, Tamiflu, and amoxicillin.  She only took amoxicillin x 5 days.  She developed increased liver function tests on 11/02/2017. CMV IgG was positive.  EBV VCA IgG, NA IgG, early antigen antibody IgG were elevated.  EBV VCA IgM was < 36.  Testing was c/w a convalescence/past infection or reactivated infection.  LFTs normalized on 11/15/2017.  Abdomen and pelvic CT on 11/10/2017 revealed a 2.2 cm spiculated density in right middle lobe concerning  for malignancy.   Chest CT on 11/16/2017 revealed a 2.2 x 2.0 x 2.4 spiculated RIGHT middle lobe mass.  There was tiny nonspecific upper lobe pulmonary nodules greater on RIGHT, largest 3 mm, of uncertain etiology.  There was additional 8 x 8 mm opacity in the posterior sulcus of the RIGHT lower lobe.  She has renal insufficiency.  Creatinine has been followed:  1.17 on 11/05/2017, 1.85 on 11/09/2017, 1.38 on 11/12/2017, 1.74 on 11/15/2017, and 1.66 on 11/17/2017.  Urinalysis on 11/15/2017 revealed revealed no hemoglobin, bilirubin or active sediment.  Symptomatically, she is doing "ok".  She has no acute symptoms today.  Plan: 1.  Review all interval labs.  Liver enzymes an alkaline phosphatase have normalized.  Bilirubin slightly elevated. 2.  Discuss chest CT -  2.2 x 2.0 x 2.4 spiculated RIGHT middle lobe mass suspicious for a primary pulmonary neoplasm. Will need further testing.  3.  Discuss elevated creatinine.  Unclear if autoimmune.  Discuss  referral to nephrology. 4.  Schedule PFTs. 5.  Schedule PET scan. 6.  Refer back to GI. EBV and CMV testing has been done. Patient need follow up appointment with Tammi Klippel, PA.  7.  Present patient at multidisciplinary tumor board on 11/18/2017. 8.  Continue allopurinol 100 mg daily. 9.  RTC for MD assessment after PET scan to discuss results.    Honor Loh, NP  11/17/2017, 1:42 PM   I saw and evaluated the patient, participating in the key portions of the service and reviewing pertinent diagnostic studies and records.  I reviewed the nurse practitioner's note and agree with the findings and the plan.  The assessment and plan were discussed with the patient. Multiple questions were asked by the patient and answered.   Nolon Stalls, MD 11/17/2017,1:42 PM

## 2017-11-18 ENCOUNTER — Ambulatory Visit: Payer: BLUE CROSS/BLUE SHIELD | Attending: Urgent Care

## 2017-11-18 DIAGNOSIS — R918 Other nonspecific abnormal finding of lung field: Secondary | ICD-10-CM

## 2017-11-18 DIAGNOSIS — J449 Chronic obstructive pulmonary disease, unspecified: Secondary | ICD-10-CM | POA: Insufficient documentation

## 2017-11-18 MED ORDER — ALBUTEROL SULFATE (2.5 MG/3ML) 0.083% IN NEBU
2.5000 mg | INHALATION_SOLUTION | Freq: Once | RESPIRATORY_TRACT | Status: AC
  Administered 2017-11-18: 2.5 mg via RESPIRATORY_TRACT
  Filled 2017-11-18: qty 3

## 2017-11-22 ENCOUNTER — Ambulatory Visit
Admission: RE | Admit: 2017-11-22 | Discharge: 2017-11-22 | Disposition: A | Discharge status: Home / Self Care | Payer: BLUE CROSS/BLUE SHIELD | Source: Ambulatory Visit | Attending: Urgent Care | Admitting: Urgent Care

## 2017-11-22 DIAGNOSIS — R918 Other nonspecific abnormal finding of lung field: Secondary | ICD-10-CM | POA: Diagnosis not present

## 2017-11-22 LAB — GLUCOSE, CAPILLARY: GLUCOSE-CAPILLARY: 84 mg/dL (ref 65–99)

## 2017-11-22 MED ORDER — FLUDEOXYGLUCOSE F - 18 (FDG) INJECTION
8.7000 | Freq: Once | INTRAVENOUS | Status: AC | PRN
  Administered 2017-11-22: 8.7 via INTRAVENOUS

## 2017-11-23 ENCOUNTER — Encounter: Payer: Self-pay | Admitting: Hematology and Oncology

## 2017-11-23 ENCOUNTER — Inpatient Hospital Stay (HOSPITAL_BASED_OUTPATIENT_CLINIC_OR_DEPARTMENT_OTHER): Payer: BLUE CROSS/BLUE SHIELD | Admitting: Hematology and Oncology

## 2017-11-23 ENCOUNTER — Encounter: Payer: Self-pay | Admitting: *Deleted

## 2017-11-23 ENCOUNTER — Inpatient Hospital Stay: Payer: BLUE CROSS/BLUE SHIELD

## 2017-11-23 ENCOUNTER — Other Ambulatory Visit: Payer: Self-pay | Admitting: Hematology and Oncology

## 2017-11-23 ENCOUNTER — Other Ambulatory Visit: Payer: Self-pay

## 2017-11-23 VITALS — BP 152/74 | HR 94 | Temp 95.2°F | Resp 18 | Wt 167.1 lb

## 2017-11-23 DIAGNOSIS — R7989 Other specified abnormal findings of blood chemistry: Secondary | ICD-10-CM

## 2017-11-23 DIAGNOSIS — D591 Autoimmune hemolytic anemia, unspecified: Secondary | ICD-10-CM

## 2017-11-23 DIAGNOSIS — B1910 Unspecified viral hepatitis B without hepatic coma: Secondary | ICD-10-CM

## 2017-11-23 DIAGNOSIS — Z01810 Encounter for preprocedural cardiovascular examination: Secondary | ICD-10-CM | POA: Insufficient documentation

## 2017-11-23 DIAGNOSIS — Z7189 Other specified counseling: Secondary | ICD-10-CM

## 2017-11-23 DIAGNOSIS — R918 Other nonspecific abnormal finding of lung field: Secondary | ICD-10-CM

## 2017-11-23 DIAGNOSIS — N289 Disorder of kidney and ureter, unspecified: Secondary | ICD-10-CM

## 2017-11-23 DIAGNOSIS — R945 Abnormal results of liver function studies: Secondary | ICD-10-CM

## 2017-11-23 DIAGNOSIS — R911 Solitary pulmonary nodule: Secondary | ICD-10-CM | POA: Insufficient documentation

## 2017-11-23 DIAGNOSIS — B1920 Unspecified viral hepatitis C without hepatic coma: Secondary | ICD-10-CM

## 2017-11-23 HISTORY — DX: Other nonspecific abnormal finding of lung field: R91.8

## 2017-11-23 LAB — CBC WITH DIFFERENTIAL/PLATELET
Basophils Absolute: 0.1 10*3/uL (ref 0–0.1)
Basophils Relative: 4 %
Eosinophils Absolute: 0.1 10*3/uL (ref 0–0.7)
Eosinophils Relative: 4 %
HCT: 23.9 % — ABNORMAL LOW (ref 35.0–47.0)
Hemoglobin: 8 g/dL — ABNORMAL LOW (ref 12.0–16.0)
Lymphocytes Relative: 21 %
Lymphs Abs: 0.7 10*3/uL — ABNORMAL LOW (ref 1.0–3.6)
MCH: 33 pg (ref 26.0–34.0)
MCHC: 33.3 g/dL (ref 32.0–36.0)
MCV: 99 fL (ref 80.0–100.0)
Monocytes Absolute: 0.2 10*3/uL (ref 0.2–0.9)
Monocytes Relative: 5 %
Neutro Abs: 2.3 10*3/uL (ref 1.4–6.5)
Neutrophils Relative %: 66 %
Platelets: 158 10*3/uL (ref 150–440)
RBC: 2.41 MIL/uL — ABNORMAL LOW (ref 3.80–5.20)
RDW: 17.7 % — ABNORMAL HIGH (ref 11.5–14.5)
WBC: 3.5 10*3/uL — ABNORMAL LOW (ref 3.6–11.0)

## 2017-11-23 LAB — COMPREHENSIVE METABOLIC PANEL
ALT: 9 U/L — ABNORMAL LOW (ref 14–54)
AST: 15 U/L (ref 15–41)
Albumin: 3.9 g/dL (ref 3.5–5.0)
Alkaline Phosphatase: 101 U/L (ref 38–126)
Anion gap: 9 (ref 5–15)
BUN: 36 mg/dL — ABNORMAL HIGH (ref 6–20)
CO2: 22 mmol/L (ref 22–32)
Calcium: 9 mg/dL (ref 8.9–10.3)
Chloride: 106 mmol/L (ref 101–111)
Creatinine, Ser: 1.48 mg/dL — ABNORMAL HIGH (ref 0.44–1.00)
GFR calc Af Amer: 44 mL/min — ABNORMAL LOW (ref >60)
GFR calc non Af Amer: 38 mL/min — ABNORMAL LOW (ref >60)
Glucose, Bld: 95 mg/dL (ref 65–99)
Potassium: 4.6 mmol/L (ref 3.5–5.1)
Sodium: 137 mmol/L (ref 135–145)
Total Bilirubin: 1.1 mg/dL (ref 0.3–1.2)
Total Protein: 7.4 g/dL (ref 6.5–8.1)

## 2017-11-23 LAB — RETICULOCYTES
RBC.: 2.51 MIL/uL — ABNORMAL LOW (ref 3.80–5.20)
Retic Count, Absolute: 178.2 10*3/uL (ref 19.0–183.0)
Retic Ct Pct: 7.1 % — ABNORMAL HIGH (ref 0.4–3.1)

## 2017-11-23 LAB — LACTATE DEHYDROGENASE: LDH: 222 U/L — ABNORMAL HIGH (ref 98–192)

## 2017-11-23 NOTE — Progress Notes (Signed)
Here for follow up  Stated "doing ok " overall.

## 2017-11-23 NOTE — Progress Notes (Signed)
  Oncology Nurse Navigator Documentation  Navigator Location: [facility]Med Onc (11/23/17 1300)   )Navigator Encounter Type: Follow-up Appt;Diagnostic Results (11/23/17 1300)   Abnormal Finding Date: 11/10/17 (11/23/17 1300)                 Patient Visit Type: MedOnc (11/23/17 1300) Treatment Phase: Abnormal Scans (11/23/17 1300) Barriers/Navigation Needs: Coordination of Care (11/23/17 1300)   Interventions: Coordination of Care (11/23/17 1300)   Coordination of Care: Appts;Broncoscopy (11/23/17 1300)        Acuity: Level 2 (11/23/17 1300)   Acuity Level 2: Initial guidance, education and coordination as needed;Educational needs;Assistance expediting appointments (11/23/17 1300)  met with patient and daughter during follow up visit with Dr. Mike Gip to review results from recent PET scan. All questions answered at the time of visit. Reviewed next steps with patient and that further appts will be called to her. Contact info given to patient and daughter. Informed to call with any further questions or concerns. Pt and daughter verbalized understanding.   Time Spent with Patient: 45 (11/23/17 1300)

## 2017-11-23 NOTE — Progress Notes (Signed)
[facility] day:  11/23/2017  Chief Complaint: Tina Page is a 62 y.o. female with a a right middle lobe mass, warm autoimmune hemolytic anemia, and renal insufficiency who is seen for review of interval PET scan and discussion regarding direction of therapy.  HPI:  The patient was last seen in the hematology clinic on 11/17/2017.  At that time, she felt "ok".  She denied any B symptoms.  Exam was unremarkable with no adenopathy or hepatosplenomegaly.  Hemoglobin was 7.9.  Platelet count was 157,000.  LFTs were normal except a bilirubin of 1.5.  Creatinine was 1.66.  LDH was 254.   Chest CT on 11/16/2017 revealed a 2.2 x 2.0 x 2.4 spiculated RIGHT middle lobe mass suspicious for a primary pulmonary neoplasm.  She noted a 21 pack year smoking history (1/2 pack/day from age [age]).   We discussed PET scan and PFTs.    She was referred to nephrology and scheduled to follow-up with GI.  PET scan on 11/22/2017 revealed a 2 cm hypermetabolic right middle lobe lung mass (SUV 3.4) consistent  with primary lung neoplasm.  There were no findings for mediastinal/hilar lymphadenopathy or metastatic disease.  There were areas of hypermetabolism involving the left oblique abdominal muscles and the anorectal junction.  PFTs on 11/18/2017 revealed an FEV1 of 1.22 liters (51%).  DLCO adj was 6.8 mL/mmHg/min (32%).  Symptomatically, patient is doing well. She denies any acute symptoms today. Patient denies changes in urine color.  Patient denies chest bruising or bleeding. She denies chest pain and increased shortness of breath. Patient has had no B symptoms or interval infections.   Patient is eating well. She has gained 3 pounds. She denies pain in the clinic today.    Past Medical History:  Diagnosis Date  . Anemia   . COPD (chronic obstructive pulmonary disease) ([place])   . HLD (hyperlipidemia)   . Hypertension     Past Surgical History:  Procedure  Laterality Date  . ESOPHAGOGASTRODUODENOSCOPY (EGD) WITH PROPOFOL N/A 07/08/2016   Procedure: ESOPHAGOGASTRODUODENOSCOPY (EGD) WITH PROPOFOL;  Surgeon: Manya Silvas, MD;  Location: [facility] ENDOSCOPY;  Service: Endoscopy;  Laterality: N/A;  . KNEE SURGERY    . PERIPHERAL VASCULAR CATHETERIZATION N/A 07/10/2016   Procedure: Lower Extremity Angiography;  Surgeon: Katha Cabal, MD;  Location: [place] [facility];  Service: Cardiovascular;  Laterality: N/A;  . PERIPHERAL VASCULAR CATHETERIZATION N/A 07/10/2016   Procedure: Abdominal Aortogram w/Lower Extremity;  Surgeon: Katha Cabal, MD;  Location: [place] [facility];  Service: Cardiovascular;  Laterality: N/A;  . PERIPHERAL VASCULAR CATHETERIZATION  07/10/2016   Procedure: Lower Extremity Intervention;  Surgeon: Katha Cabal, MD;  Location: [place] [facility];  Service: Cardiovascular;;    Family History  Problem Relation Age of Onset  . Diabetes Mother   . Hypertension Mother   . Diabetes Maternal Grandfather   . Hypertension Maternal Grandfather     Social History:  reports that she quit smoking about 16 months ago. Her smoking use included cigarettes. She smoked 0.50 packs per day. She has never used smokeless tobacco. She reports that she does not drink alcohol or use drugs.  She has a 21 pack year smoking history (1/2 pack/day from age [age]).  The patient works at [employer]  She is exposed to cold temperatures.  She works 3rd shift.  She has a daughter, [name] and a daughter named [name].  She is accompanied by [name]  today.   Allergies:  Allergies  Allergen Reactions  . Codeine Anaphylaxis    Current Medications: Current Outpatient Medications  Medication Sig Dispense Refill  . allopurinol (ZYLOPRIM) 100 MG tablet Take 1 tablet (100 mg total) by mouth daily. 30 tablet 0  . clopidogrel (PLAVIX) 75 MG tablet Take 1 tablet (75 mg total) by mouth daily. 30 tablet 5  . cyanocobalamin 500 MCG tablet  Take 500 mcg by mouth daily.    [name] Kitchen docusate sodium (COLACE) 100 MG capsule Take 1 capsule (100 mg total) by mouth 2 (two) times daily. 10 capsule 0  . folic acid (FOLVITE) 1 MG tablet TAKE 1 TABLET (1 MG TOTAL) BY MOUTH DAILY. 90 tablet 1  . hydrochlorothiazide (HYDRODIURIL) 25 MG tablet Take 25 mg by mouth daily.    [name] Kitchen levothyroxine (SYNTHROID, LEVOTHROID) 25 MCG tablet Take 25 mcg by mouth daily.    [name] Kitchen lisinopril (PRINIVIL,ZESTRIL) 40 MG tablet Take 40 mg by mouth daily.    [name] Kitchen omeprazole (PRILOSEC) 20 MG capsule Take 1 capsule (20 mg total) by mouth daily. 60 capsule 0  . potassium chloride (KLOR-CON) 20 MEQ packet Take 20 mEq by mouth daily.    . pravastatin (PRAVACHOL) 40 MG tablet Take 40 mg by mouth every evening.    [name] Kitchen albuterol (PROVENTIL HFA;VENTOLIN HFA) 108 (90 Base) MCG/ACT inhaler Inhale into the lungs.     No current facility-administered medications for this visit.     Review of Systems:  GENERAL: Feels "well".  No fevers, chills or sweats.  Weight up 3 pounds. PERFORMANCE STATUS (ECOG): 1 HEENT: No visual changes, sore throat, mouth sores or tenderness. Lungs: No shortness of breath or cough. No hemoptysis. Cardiac: No chest pain, palpitations, orthopnea, or PND. GI: No nausea, vomiting, constipation, melena or hematochezia. GU: No urgency, frequency, dysuria, or hematuria. Musculoskeletal: No back pain. No muscle tenderness. Extremities: No pain or swelling. Skin: Discoid lupus. No rashes, skin changes or ulcers. Neuro: No headache, weakness, balance or coordination issues. Endocrine: No diabetes, thyroid issues, hot flashes or night sweats. Psych: No mood changes, depression or anxiety. Pain:  No back pain. No joint pain. No muscle aches. Review of systems: All other systems reviewed and found to be negative.  Physical Exam: Blood pressure (!) 152/74, pulse 94, temperature (!) 95.2 F (35.1 C), temperature source Tympanic, resp. rate 18, weight 167 lb 1.6  oz (75.8 kg). GENERAL:Well developed, well nourished, woman sitting comfortably in the exam room in no acute distress. MENTAL STATUS: Alert and oriented to person, place and time. HEAD:  [name] hair pulled back.  Normocephalic, atraumatic, face symmetric, no Cushingoid features. Checks full. EYES:Black rimmed glasses.  Blue eyes.  Pupils equal round and reactive to light and accomodation. No conjunctivitis or scleral icterus. JZP:HXTA lip herpetic lesion.  Oropharynx clear without lesion. Tonguenormal.  Mucous membranes moist. RESPIRATORY:Clear to auscultationwithout rales, wheezes or rhonchi. CARDIOVASCULAR:Regular rate andrhythmwithout murmur, rub or gallop. ABDOMEN:Soft, non-tender, with active bowel sounds, and no hepatosplenomegaly. No masses. SKIN: No petechiae or ecchymosis. EXTREMITIES: No edema, no skin discoloration or tenderness. No palpable cords. LYMPHNODES: No palpable cervical, supraclavicular, axillary or inguinal adenopathy  NEUROLOGICAL: Unremarkable   Appointment on 11/23/2017  Component Date Value Ref Range Status  . Retic Ct Pct 11/23/2017 PENDING  0.4 - 3.1 % Incomplete  . RBC. 11/23/2017 PENDING  3.87 - 5.11 MIL/uL Incomplete  . Retic Count, Absolute 11/23/2017 PENDING  19.0 - 186.0 K/uL Incomplete  . Sodium 11/23/2017 137  135 - 145 mmol/L Final  .  Potassium 11/23/2017 4.6  3.5 - 5.1 mmol/L Final  . Chloride 11/23/2017 106  101 - 111 mmol/L Final  . CO2 11/23/2017 22  22 - 32 mmol/L Final  . Glucose, Bld 11/23/2017 95  65 - 99 mg/dL Final  . BUN 11/23/2017 36* 6 - 20 mg/dL Final  . Creatinine, Ser 11/23/2017 1.48* 0.44 - 1.00 mg/dL Final  . Calcium 11/23/2017 9.0  8.9 - 10.3 mg/dL Final  . Total Protein 11/23/2017 7.4  6.5 - 8.1 g/dL Final  . Albumin 11/23/2017 3.9  3.5 - 5.0 g/dL Final  . AST 11/23/2017 15  15 - 41 U/L Final  . ALT 11/23/2017 9* 14 - 54 U/L Final  . Alkaline Phosphatase 11/23/2017 101  38 - 126 U/L Final  . Total  Bilirubin 11/23/2017 1.1  0.3 - 1.2 mg/dL Final  . GFR calc non Af Amer 11/23/2017 38* >60 mL/min Final  . GFR calc Af Amer 11/23/2017 44* >60 mL/min Final   Comment: (NOTE) The eGFR has been calculated using the CKD EPI equation. This calculation has not been validated in all clinical situations. eGFR's persistently <60 mL/min signify possible Chronic Kidney Disease.   Georgiann Hahn gap 11/23/2017 9  5 - 15 Final   Performed at [facility], [address] [place], [place] [address]  . LDH 11/23/2017 222* 98 - 192 U/L Final   Performed at [facility], [address] [place], [place] [address]  . WBC 11/23/2017 3.5* 3.6 - 11.0 K/uL Final  . RBC 11/23/2017 2.41* 3.80 - 5.20 MIL/uL Final  . Hemoglobin 11/23/2017 8.0* 12.0 - 16.0 g/dL Final  . HCT 11/23/2017 23.9* 35.0 - 47.0 % Final  . MCV 11/23/2017 99.0  80.0 - 100.0 fL Final  . MCH 11/23/2017 33.0  26.0 - 34.0 pg Final  . MCHC 11/23/2017 33.3  32.0 - 36.0 g/dL Final  . RDW 11/23/2017 17.7* 11.5 - 14.5 % Final  . Platelets 11/23/2017 158  150 - 440 K/uL Final  . Neutrophils Relative % 11/23/2017 66  % Final  . Neutro Abs 11/23/2017 2.3  1.4 - 6.5 K/uL Final  . Lymphocytes Relative 11/23/2017 21  % Final  . Lymphs Abs 11/23/2017 0.7* 1.0 - 3.6 K/uL Final  . Monocytes Relative 11/23/2017 5  % Final  . Monocytes Absolute 11/23/2017 0.2  0.2 - 0.9 K/uL Final  . Eosinophils Relative 11/23/2017 4  % Final  . Eosinophils Absolute 11/23/2017 0.1  0 - 0.7 K/uL Final  . Basophils Relative 11/23/2017 4  % Final  . Basophils Absolute 11/23/2017 0.1  0 - 0.1 K/uL Final   Performed at [facility], [address] [place], [place] [address]  Hospital Outpatient Visit on 11/22/2017  Component Date Value Ref Range Status  . 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 One week prior to presentation, she felt like she was coming down with something. She denied any fever, runny nose, sore throat or cough. She had some diarrhea. She denied any new medications or herbal products.  Anemia workup revealed a cold autoantibody (IgG and complement).  Reticulocyte count was 11.3%.  Ferritin was 562.  Iron studies revealed a saturation of 20% and a TIBC of 214 (low).  B12 was 231 (low normal).  Folate was 42.  Peripheral smear revealed rouleaux formation.  Additional testing included the following + studies:  hepatitis C antibody, hepatitis B core antibody, CMV IgM, and EBV VCA (IgM and IgG).  Hepatitis B by PCR was negative.  Hepatitis C RNA was negative.  Mycoplasma pneumonia IgM was negative.  Reticulocyte count was 11.3% (high) indicating appropriate marrow response.  C3 and C4 were normal.  Cold agglutinin titer was negative x 2.  Negative studies included:  ANA, hepatitis B surface antigen, SPEP, and free light chain ratio.   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  MMA was initially 330 (normal).  Repeat MMA was elevated on 08/01/2016 confirming B12 deficiency.  Hepatitis B surface antibody was positive and hepatitis B surface antigen was negative on 08/06/2016.  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 There was no adenopathy. Spleen was normal.  Abdominal ultrasound on 04/15/2017 revealed a normal spleen and sludge in the gallbladder.  The liver was echogenic consistent fatty infiltration and/or hepatocellular disease.  She underwent PTCA and stent placement in right and left common iliac arteries and left external iliac artery on 07/10/2016.  She is on Plavix.  EGD on 03/19/2015 revealed gastritis in the body and antrum.  Colonoscopy on 03/19/2015 revealed one hyperplastic polyp. EGD on 07/08/2016 was normal.  No evidence of bleeding.  She has received 6 units of warmed PRBCs to date (last on 08/01/2016). If Rituxan is needed, she requires entecavir 0.5 mg q day beginning 2 weeks prior to Rituxan.  In addition, hepatitis B viral level and LFTs should be checked monthly.  She began steroids (1 mg/kg) on 08/01/2016.  She stopped prednisone on 03/12/2017.  She is on folic acid 1 mg a day.  She received B12 1000 mcg IM on 08/06/2016.  She is on oral B12.  B12 and folate were normal on 03/09/2017.  She developed peri-oral and intranasal herpes simplex-1.  She was treated with valacyclovir and doxycycline on 10/19/2016.  She developed a transient elevated alkaline phosphatase on 10/19/2016 which subsequently normalized.  She was documented to have a warm autoantibody on 10/12/2017.  LDH and bilirubin were normal.  Hematocrit had improved from prior testing.    She developed flu like symptoms on 10/26/2017.  Symptoms included cough, myalgias, and fever (tmax unknown).  She was prescribed Mucinex, Tamiflu, and amoxicillin.  She only took amoxicillin x 5 days.  She developed increased liver function tests on 11/02/2017. CMV IgG was positive.  EBV VCA IgG, NA IgG, early antigen antibody IgG were elevated.  EBV VCA IgM was < 36.  Testing was c/w a convalescence/past infection or reactivated infection.  LFTs normalized on 11/15/2017.  Smooth muscle antibody was 39 (high) on 11/10/2017.  Abdomen and pelvic CT on 11/10/2017 revealed a 2.2 cm spiculated density in right middle lobe concerning  for malignancy.   Chest CT on 11/16/2017 revealed a 2.2 x 2.0 x 2.4 spiculated RIGHT middle lobe mass.  There was tiny nonspecific upper lobe pulmonary nodules greater on RIGHT, largest 3 mm, of uncertain etiology.  There was additional 8 x 8 mm opacity in the posterior sulcus of the RIGHT lower lobe.  PET scan on 11/22/2017 revealed a 2 cm  hypermetabolic right middle lobe lung mass (SUV 3.4) consistent  with primary lung neoplasm.  There were no findings for mediastinal/hilar lymphadenopathy or metastatic disease.  There were areas of hypermetabolism involving the left oblique abdominal muscles and the anorectal junction.  PFTs on 11/18/2017 revealed an FEV1 of 1.22 liters (51%).  DLCO adj was 6.8 mL/mmHg/min (32%).  She has renal insufficiency.  Creatinine has been followed:  1.17 on 11/05/2017, 1.85 on 11/09/2017, 1.38 on 11/12/2017, 1.74 on 11/15/2017, and 1.66 on 11/17/2017.  Urinalysis on 11/15/2017 revealed revealed no hemoglobin, bilirubin or active sediment.  Symptomatically, she is doing "ok".  She has no acute symptoms today. WBC 3500 with an ANC of 2300. Hemoglobin 8.0, hematocrit 23.9, and platelets 158,000. LDH 222.   Plan: 1.  Lbas today:  CBC with diff, CMP, LDH, retic. 2.  Discuss PET scan- isolated 2 cm RML nodule c/w lung primary.  Discuss referral to pulmonary medicine for bronchoscopy. 3.  Discuss PFTs- poor. 4.  Discuss nephrology, GI, surgical, and pulmonary consults. In agreement with plans. Will schedule consults today with assistance of lung navigator.   5.  RTC for MD assessment after bronchoscopy and biopsy.   Honor Loh, NP  11/23/2017, 10:21 AM   I saw and evaluated the patient, participating in the key portions of the service and reviewing pertinent diagnostic studies and records.  I reviewed the nurse practitioner's note and agree with the findings and the plan.  The assessment and plan were discussed with the patient. Multiple questions were asked by the patient and answered.   Nolon Stalls, MD 11/23/2017,10:21 AM

## 2017-11-25 ENCOUNTER — Other Ambulatory Visit: Payer: Self-pay | Admitting: Urgent Care

## 2017-11-26 ENCOUNTER — Ambulatory Visit (INDEPENDENT_AMBULATORY_CARE_PROVIDER_SITE_OTHER): Payer: BLUE CROSS/BLUE SHIELD | Admitting: Cardiothoracic Surgery

## 2017-11-26 ENCOUNTER — Encounter: Payer: Self-pay | Admitting: Cardiothoracic Surgery

## 2017-11-26 DIAGNOSIS — R918 Other nonspecific abnormal finding of lung field: Secondary | ICD-10-CM

## 2017-11-26 NOTE — Progress Notes (Signed)
Patient ID: Tina Page, female   DOB: 1956/02/26, 62 y.o.   MRN: [id number]  Chief Complaint  Patient presents with  . New Patient (Initial Visit)    2 cm hypermetabolic right middle lobe lung mass (SUV 3.4) consistent  with primary lung neoplasm    Referred By Dr. Mike Gip Reason for Referral right middle lobe mass  HPI Location, Quality, Duration, Severity, Timing, Context, Modifying Factors, Associated Signs and Symptoms.  Tina Page is a 62 y.o. female.  She has a an established relationship with our hematology oncology service having carried the diagnosis of cold hemolytic anemia.  She has been followed for quite a while and has also has a history of hepatitis B and C as well as perhaps lupus.  At one point she had an abdominal ultrasound done which was performed for evaluation of elevated liver functions.  She had an abnormality identified on that which led to a CT scan of the abdomen.  There is then an abnormality identified on that CT and she then had a dedicated chest CT and PET scan.  This demonstrated a 2 cm hypermetabolic lesion in the right middle lobe.  There is no fissure between the upper and middle lobes.  She then had some pulmonary function studies done which reveal an FEV1 of 1.2 or 51% of predicted and a DLCO of 32% of predicted with a DLCO corrected 50-55% of predicted.  The patient has a very long history of smoking having smoked 1 pack of cigarettes a day for over 40 years.  She quit smoking about 2 years ago.  She currently works at [employer] and does some heavy lifting but for the most part leads a relatively sedentary lifestyle.  She does not complain of any shortness of breath, weight loss or hemoptysis.    Past Medical History:  Diagnosis Date  . Anemia   . Atherosclerosis of native arteries of extremity with intermittent claudication ([place]) 07/20/2016  . COPD (chronic obstructive pulmonary disease) ([place])   . 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  Elevated liver function tests 11/03/2017  . Heme positive stool 01/29/2015  . HLD (hyperlipidemia)   . Hypertension   . 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 Thrombocytopenia ([place]) 10/04/2016    Past Surgical History:  Procedure Laterality Date  . ESOPHAGOGASTRODUODENOSCOPY (EGD) WITH PROPOFOL N/A 07/08/2016   Procedure: ESOPHAGOGASTRODUODENOSCOPY (EGD) WITH PROPOFOL;  Surgeon: Manya Silvas, MD;  Location: [facility] ENDOSCOPY;  Service: Endoscopy;  Laterality: N/A;  . KNEE SURGERY    . PERIPHERAL VASCULAR CATHETERIZATION N/A 07/10/2016   Procedure: Lower Extremity Angiography;  Surgeon: Katha Cabal, MD;  Location: [place] [facility];  Service: Cardiovascular;  Laterality: N/A;  . PERIPHERAL VASCULAR CATHETERIZATION N/A 07/10/2016   Procedure: Abdominal Aortogram w/Lower Extremity;  Surgeon: Katha Cabal, MD;  Location: [place] [facility];  Service: Cardiovascular;  Laterality: N/A;  . PERIPHERAL VASCULAR CATHETERIZATION  07/10/2016   Procedure: Lower Extremity Intervention;  Surgeon: Katha Cabal, MD;  Location: [place] [facility];  Service: Cardiovascular;;    Family History  Problem Relation Age of Onset  . Diabetes Mother   . Hypertension Mother   . Diabetes Maternal Grandfather   . Hypertension Maternal Grandfather     Social History Social History   Tobacco Use  . Smoking status: Former Smoker    Packs/day: 0.50    Types: Cigarettes    Last attempt to quit: 07/11/2016  Years since quitting: 1.3  . Smokeless tobacco: Never Used  Substance Use Topics  . Alcohol use: No  . Drug use: No    Allergies  Allergen Reactions  . Codeine Anaphylaxis    Current Outpatient Medications  Medication Sig Dispense Refill  . albuterol (PROVENTIL HFA;VENTOLIN HFA) 108 (90 Base) MCG/ACT inhaler Inhale into the lungs.    [name] Kitchen allopurinol (ZYLOPRIM) 100 MG tablet Take 1 tablet (100 mg total) by mouth daily. 30 tablet 0  . clopidogrel  (PLAVIX) 75 MG tablet Take 1 tablet (75 mg total) by mouth daily. 30 tablet 5  . cyanocobalamin 500 MCG tablet Take 500 mcg by mouth daily.    [name] Kitchen docusate sodium (COLACE) 100 MG capsule Take 1 capsule (100 mg total) by mouth 2 (two) times daily. 10 capsule 0  . folic acid (FOLVITE) 1 MG tablet TAKE 1 TABLET (1 MG TOTAL) BY MOUTH DAILY. 90 tablet 1  . hydrochlorothiazide (HYDRODIURIL) 25 MG tablet Take 25 mg by mouth daily.    [name] Kitchen levothyroxine (SYNTHROID, LEVOTHROID) 25 MCG tablet Take 25 mcg by mouth daily.    [name] Kitchen lisinopril (PRINIVIL,ZESTRIL) 40 MG tablet Take 40 mg by mouth daily.    [name] Kitchen omeprazole (PRILOSEC) 20 MG capsule Take 1 capsule (20 mg total) by mouth daily. 60 capsule 0  . potassium chloride (KLOR-CON) 20 MEQ packet Take 20 mEq by mouth daily.    . pravastatin (PRAVACHOL) 40 MG tablet Take 40 mg by mouth every evening.     No current facility-administered medications for this visit.       Review of Systems A complete review of systems was asked and was negative except for the following positive findings occasional anemia requiring blood transfusion  There were no vitals taken for this visit.  Physical Exam CONSTITUTIONAL:  Pleasant, well-developed, well-nourished, and in no acute distress. EYES: Pupils equal and reactive to light, Sclera non-icteric EARS, NOSE, MOUTH AND THROAT:  The oropharynx was clear.  Dentition is good repair.  Oral mucosa pink and moist. LYMPH NODES:  Lymph nodes in the neck and axillae were normal RESPIRATORY:  Lungs were clear.  Normal respiratory effort without pathologic use of accessory muscles of respiration CARDIOVASCULAR: Heart was regular without murmurs.  There were no carotid bruits. GI: The abdomen was soft, nontender, and nondistended. There were no palpable masses. There was no hepatosplenomegaly. There were normal bowel sounds in all quadrants. GU:  Rectal deferred.   MUSCULOSKELETAL:  Normal muscle strength and tone.  No clubbing or cyanosis.    SKIN:  There were no pathologic skin lesions.  There were no nodules on palpation. NEUROLOGIC:  Sensation is normal.  Cranial nerves are grossly intact. PSYCH:  Oriented to person, place and time.  Mood and affect are normal.  Data Reviewed CT scan and PET scan  I have personally reviewed the patient's imaging, laboratory findings and medical records.    Assessment    There is a 2 cm hypermetabolic mass in the right middle lobe.  There is a very ill-defined fissure between the upper middle lobes.  Her pulmonary function tests reveal an FEV1 of only 50% of predicted and a DLCO that is comparable.    Plan    I had a long discussion with her regarding the options.  I do think that this is most likely a malignancy.  It was not present on a scan 2 years ago.  I reviewed with her the indications and risks of surgical intervention.  We discussed the  risks of bleeding, infection, air leak and death.  We also reviewed the options of radiation therapy.  I would like her to be seen by 1 of our pulmonologist to see if there is an opportunity to improve her overall pulmonary function tests.  I also wanted her to see 1 of our cardiologist for evaluation prior to surgery and we will also consider doing a percutaneous biopsy if the patient desires.  After extensive discussion with her and her daughter that is the plan we agreed to.  She will call me back on Monday to let me know how she would like to proceed.    Nestor Lewandowsky, MD 11/26/2017, 1:17 PM

## 2017-11-29 ENCOUNTER — Telehealth: Payer: Self-pay | Admitting: Cardiothoracic Surgery

## 2017-11-29 NOTE — Telephone Encounter (Signed)
Patient is calling and is wanting to schedule her surgery, patient said she wanted to inform Dr. Genevive Bi she is seeing her pulmonology doctor on April 3rd. Please call patient and schedule.

## 2017-11-29 NOTE — Telephone Encounter (Signed)
Called patient to let her know that I had spoken to Dr. Genevive Bi in reference to her desire to have surgery. However, I told patient that she needed to see the Pulmonologist so she could improve her PFT results. I also told her that she needed to see a cardiologist. So, once she had all of this done, then she could come back to see Dr. Genevive Bi to discus possible surgery. Patient understood and had no further questions.

## 2017-11-30 ENCOUNTER — Other Ambulatory Visit
Admission: RE | Admit: 2017-11-30 | Discharge: 2017-11-30 | Disposition: A | Discharge status: Home / Self Care | Payer: BLUE CROSS/BLUE SHIELD | Source: Ambulatory Visit | Attending: Gastroenterology | Admitting: Gastroenterology

## 2017-11-30 ENCOUNTER — Encounter: Payer: Self-pay | Admitting: Gastroenterology

## 2017-11-30 ENCOUNTER — Other Ambulatory Visit: Payer: Self-pay

## 2017-11-30 ENCOUNTER — Ambulatory Visit (INDEPENDENT_AMBULATORY_CARE_PROVIDER_SITE_OTHER): Payer: BLUE CROSS/BLUE SHIELD | Admitting: Gastroenterology

## 2017-11-30 VITALS — BP 147/80 | HR 74 | Temp 98.0°F | Ht 63.5 in | Wt 166.6 lb

## 2017-11-30 DIAGNOSIS — R945 Abnormal results of liver function studies: Secondary | ICD-10-CM | POA: Diagnosis not present

## 2017-11-30 DIAGNOSIS — R7989 Other specified abnormal findings of blood chemistry: Secondary | ICD-10-CM

## 2017-11-30 LAB — IRON AND TIBC
IRON: 59 ug/dL (ref 28–170)
SATURATION RATIOS: 23 % (ref 10.4–31.8)
TIBC: 254 ug/dL (ref 250–450)
UIBC: 195 ug/dL

## 2017-11-30 LAB — FERRITIN: FERRITIN: 667 ng/mL — AB (ref 11–307)

## 2017-11-30 NOTE — Progress Notes (Signed)
Jonathon Bellows MD, MRCP(U.K) [address]  [place]  [place], [place] [address]  Main: [phone]  Fax: [phone]   Gastroenterology Consultation  Referring Provider:     Emmaline Kluver.,* Primary Care Physician:  Frazier Richards, MD Primary Gastroenterologist:  Dr. Jonathon Bellows  Reason for Consultation:     Positive ANA        HPI:   Tina Page is a 62 y.o. y/o female . She has been referred for a positive ANA, hepatitis C antibody positive.    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 LFt's were acutely elevated for 3 weeks and rapidly returned to normal. 11/03/17 - HCV not detected. Hep B c ab +ve, AMA negative. Elevated Ferritin . Hbsag-negative. She presently follows with Dr Mike Gip and Dr Genevive Bi for a lesion in the right middle lobe of the lung. Long standing history of smoking . Working diagnosis of malignancy and plan is for surgery. CT abdomen 10/2017 shows no liver abnormalities.   She recalls she had hepatitis when she was in high school but cannot recall being treated. 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 No excess alcohol use, no tatoos. No liver disease in family . She has some fatigue. No OTC meds. She recalls she had the "flu " and was given some antibiotics    Hepatic Function Latest Ref Rng & Units 11/23/2017 11/17/2017 11/15/2017  Total Protein 6.5 - 8.1 g/dL 7.4 7.5 7.7  Albumin 3.5 - 5.0 g/dL 3.9 3.8 4.0  AST 15 - 41 U/L 15 16 19   ALT 14 - 54 U/L 9(L) 11(L) 13(L)  Alk Phosphatase 38 - 126 U/L 101 118 122  Total Bilirubin 0.3 - 1.2 mg/dL 1.1 1.5(H) 1.2  Bilirubin, Direct 0.1 - 0.5 mg/dL - - -       Past Medical History:  Diagnosis Date  . Anemia   . Atherosclerosis of native arteries of extremity with intermittent claudication ([place]) 07/20/2016  . COPD (chronic obstructive pulmonary disease) ([place])   . 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 Elevated liver function tests 11/03/2017  . Heme positive  stool 01/29/2015  . HLD (hyperlipidemia)   . Hypertension   . 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 Thrombocytopenia ([place]) 10/04/2016    Past Surgical History:  Procedure Laterality Date  . ESOPHAGOGASTRODUODENOSCOPY (EGD) WITH PROPOFOL N/A 07/08/2016   Procedure: ESOPHAGOGASTRODUODENOSCOPY (EGD) WITH PROPOFOL;  Surgeon: Manya Silvas, MD;  Location: [facility] ENDOSCOPY;  Service: Endoscopy;  Laterality: N/A;  . KNEE SURGERY    . PERIPHERAL VASCULAR CATHETERIZATION N/A 07/10/2016   Procedure: Lower Extremity Angiography;  Surgeon: Katha Cabal, MD;  Location: [place] [facility];  Service: Cardiovascular;  Laterality: N/A;  . PERIPHERAL VASCULAR CATHETERIZATION N/A 07/10/2016   Procedure: Abdominal Aortogram w/Lower Extremity;  Surgeon: Katha Cabal, MD;  Location: [place] [facility];  Service: Cardiovascular;  Laterality: N/A;  . PERIPHERAL VASCULAR CATHETERIZATION  07/10/2016   Procedure: Lower Extremity Intervention;  Surgeon: Katha Cabal, MD;  Location: [place] [facility];  Service: Cardiovascular;;    Prior to Admission medications   Medication Sig Start Date End Date Taking? Authorizing Provider  albuterol (PROVENTIL HFA;VENTOLIN HFA) 108 (90 Base) MCG/ACT inhaler Inhale into the lungs.    [provider]  allopurinol (ZYLOPRIM) 100 MG tablet Take 1 tablet (100 mg total) by mouth daily. 11/09/17   Karen Kitchens, NP  clopidogrel (PLAVIX) 75 MG tablet Take 1 tablet (75 mg total) by mouth daily. 07/13/16   Theodoro Grist, MD  cyanocobalamin 500 MCG tablet Take 500 mcg by mouth daily.    [provider]  docusate sodium (COLACE) 100 MG capsule Take 1 capsule (100 mg total) by mouth 2 (two) times daily. 07/12/16   Theodoro Grist, MD  folic acid (FOLVITE) 1 MG tablet TAKE 1 TABLET (1 MG TOTAL) BY MOUTH DAILY. 09/06/17   Lequita Asal, MD  hydrochlorothiazide (HYDRODIURIL) 25 MG tablet Take 25 mg by mouth daily. 02/25/17    [provider]  levothyroxine (SYNTHROID, LEVOTHROID) 25 MCG tablet Take 25 mcg by mouth daily. 02/25/17   [provider]  lisinopril (PRINIVIL,ZESTRIL) 40 MG tablet Take 40 mg by mouth daily. 12/23/16   [provider]  omeprazole (PRILOSEC) 20 MG capsule Take 1 capsule (20 mg total) by mouth daily. 08/02/16   Fritzi Mandes, MD  potassium chloride (KLOR-CON) 20 MEQ packet Take 20 mEq by mouth daily.    [provider]  pravastatin (PRAVACHOL) 40 MG tablet Take 40 mg by mouth every evening.    [provider]    Family History  Problem Relation Age of Onset  . Diabetes Mother   . Hypertension Mother   . Diabetes Maternal Grandfather   . Hypertension Maternal Grandfather      Social History   Tobacco Use  . Smoking status: Former Smoker    Packs/day: 0.50    Types: Cigarettes    Last attempt to quit: 07/11/2016    Years since quitting: 1.3  . Smokeless tobacco: Never Used  Substance Use Topics  . Alcohol use: No  . Drug use: No    Allergies as of 11/30/2017 - Review Complete 11/26/2017  Allergen Reaction Noted  . Codeine Anaphylaxis 07/07/2016    Review of Systems:    All systems reviewed and negative except where noted in HPI.   Physical Exam:  There were no vitals taken for this visit. No LMP recorded. Patient is postmenopausal. Psych:  Alert and cooperative. Normal mood and affect. General:   Alert,  Well-developed, well-nourished, pleasant and cooperative in NAD Head:  Normocephalic and atraumatic. Eyes:  Sclera clear, no icterus.   Conjunctiva pink. Ears:  Normal auditory acuity. Nose:  No deformity, discharge, or lesions. Mouth:  No deformity or lesions,oropharynx pink & moist. Neck:  Supple; no masses or thyromegaly. Lungs:  Respirations even and unlabored.  Clear throughout to auscultation.   No wheezes, crackles, or rhonchi. No acute distress. Heart:  Regular rate and rhythm; no murmurs, clicks, rubs, or  gallops. Abdomen:  Normal bowel sounds.  No bruits.  Soft, non-tender and non-distended without masses, hepatosplenomegaly or hernias noted.  No guarding or rebound tenderness.    Msk:  Symmetrical without gross deformities. Good, equal movement & strength bilaterally. Pulses:  Normal pulses noted. Extremities:  No clubbing or edema.  No cyanosis. Neurologic:  Alert and oriented x3;  grossly normal neurologically. Skin:  Intact without significant lesions or rashes. No jaundice. Lymph Nodes:  No significant cervical adenopathy. Psych:  Alert and cooperative. Normal mood and affect.  Imaging Studies: Ct Abdomen Pelvis Wo Contrast  Result Date: 11/10/2017 CLINICAL DATA:  Elevated liver function tests.  Possible lymphoma. EXAM: CT ABDOMEN AND PELVIS WITHOUT CONTRAST TECHNIQUE: Multidetector CT imaging of the abdomen and pelvis was performed following the standard protocol without IV contrast. COMPARISON:  CT scan of chest of July 07, 2016. FINDINGS: Lower chest:  2.2 cm spiculated density is seen in the right middle lobe which was not present on prior exam, and is concerning for malignancy. Hepatobiliary: No focal liver abnormality is seen. No gallstones, gallbladder wall thickening, or biliary dilatation. Pancreas: Unremarkable. No pancreatic ductal dilatation or surrounding inflammatory changes. Spleen: Normal in size without focal abnormality. Adrenals/Urinary Tract: Adrenal glands are unremarkable. Kidneys are normal, without renal calculi, focal lesion, or hydronephrosis. Bladder is unremarkable. Stomach/Bowel: Stomach is within normal limits. Appendix appears normal. No evidence of bowel wall thickening, distention, or inflammatory changes. Vascular/Lymphatic: Aortic atherosclerosis. No enlarged abdominal or pelvic lymph nodes. Reproductive: Uterus and bilateral adnexa are unremarkable. Other: No abdominal wall hernia or abnormality. No abdominopelvic ascites. Musculoskeletal: No acute or  significant osseous findings. IMPRESSION: 2.2 cm spiculated density is noted in right middle lobe concerning for malignancy. CT scan of the chest with intravenous contrast is recommended for further evaluation. These results will be called to the ordering clinician or representative by the Radiologist Assistant, and communication documented in the PACS or zVision Dashboard. Aortic Atherosclerosis (ICD10-I70.0). Electronically Signed   By: Marijo Conception, M.D.   On: 11/10/2017 13:58   Ct Chest Wo Contrast  Result Date: 11/16/2017 CLINICAL DATA:  Abnormal CT abdomen and pelvis exam with spiculated RIGHT middle lobe density EXAM: CT CHEST WITHOUT CONTRAST TECHNIQUE: Multidetector CT imaging of the chest was performed following the standard protocol without IV contrast. Sagittal and coronal MPR images reconstructed from axial data set. COMPARISON:  CTA chest 07/07/2016, CT abdomen and pelvis 11/10/2017 FINDINGS: Cardiovascular: Scattered atherosclerotic calcifications aorta, coronary arteries and proximal great vessels. Aorta normal caliber. No pericardial effusion. Mediastinum/Nodes: Few normal size mediastinal lymph nodes. No definite thoracic adenopathy, though hilar assessment is suboptimal due to lack of IV contrast. Esophagus unremarkable. Small nonspecific RIGHT thyroid nodules. Base of cervical region otherwise unremarkable. Lungs/Pleura: Spiculated mass RIGHT middle lobe 2.2 x 2.0 x 2.4 cm, abutting major fissure, highly suspicious for a primary pulmonary neoplasm. Focal opacity in posterior sulcus RIGHT lower lobe 8 x 8 mm image 122, favor atelectasis but additional nodule not completely excluded. Emphysematous changes in the upper lobes with scattered peribronchial thickening. A few tiny nonspecific nodular foci are seen in the upper lobes bilaterally, greater on RIGHT, largest 3 mm diameter RIGHT upper lobe image 20; a few of these are present on the previous exam with remainder new. No acute  infiltrate, pleural effusion or pneumothorax. Upper Abdomen: Question tiny dependent gallstone in gallbladder. Remaining visualized upper abdomen unremarkable. Musculoskeletal: No acute osseous findings. IMPRESSION: Spiculated RIGHT middle lobe mass 2.2 x 2.0 x 2.4 cm highly suspicious for a primary pulmonary neoplasm. Tiny nonspecific upper lobe pulmonary nodules greater on RIGHT, largest 3 mm, of uncertain etiology; these could be postinflammatory but tiny metastatic foci are not excluded. Additional 8 x 8 mm opacity in the posterior sulcus of the RIGHT lower lobe, favor atelectasis though tumor not completely excluded, recommend attention on follow-up imaging. Question cholelithiasis. Coronary arterial calcifications. Aortic Atherosclerosis (ICD10-I70.0) Emphysema (ICD10-J43.9). Electronically Signed   By: Lavonia Dana M.D.   On: 11/16/2017 16:01   US Abdomen Complete  Result Date: 11/04/2017 CLINICAL DATA:  Elevated LFTs EXAM: ABDOMEN ULTRASOUND COMPLETE COMPARISON:  04/15/2017 FINDINGS: Gallbladder: 1.2 cm non mobile echogenic focus along the posterior gallbladder wall, likely polyp. This was previously thought to represent sludge, but given its persistence in the same location, favor polyp. No visible shadowing mobile stones or wall thickening. Negative sonographic Murphy's. Common bile duct: Diameter: Normal caliber,  3 mm Liver: No focal lesion identified. Within normal limits in parenchymal echogenicity. Portal vein is patent on color Doppler imaging with normal direction of blood flow towards the liver. IVC: No abnormality visualized. Pancreas: Visualized portion unremarkable. Spleen: Size and appearance within normal limits. Right Kidney: Length: 8.9 cm. Echogenicity within normal limits. No mass or hydronephrosis visualized. Left Kidney: Length: 9.7 cm. Echogenicity within normal limits. No mass or hydronephrosis visualized. Abdominal aorta: No aneurysm visualized. Other findings: None. IMPRESSION: 1.2  cm persistent echogenic focus along the posterior gallbladder wall, likely sessile polyp. No visible stones or wall thickening. No acute findings. Electronically Signed   By: Rolm Baptise M.D.   On: 11/04/2017 09:12   Nm Pet Image Initial (pi) Skull Base To Thigh  Result Date: 11/22/2017 CLINICAL DATA:  Initial treatment strategy for right middle lobe lung lesion. EXAM: NUCLEAR MEDICINE PET SKULL BASE TO THIGH TECHNIQUE: 8.7 mCi F-18 FDG was injected intravenously. Full-ring PET imaging was performed from the skull base to thigh after the radiotracer. CT data was obtained and used for attenuation correction and anatomic localization. Fasting blood glucose: 84 mg/dl Mediastinal blood pool activity: SUV max 2.37 COMPARISON:  Chest CT 11/16/2017 and abdominal CT scan 11/10/2017 FINDINGS: NECK: No hypermetabolic lymph nodes in the neck. Incidental CT findings: none CHEST: The 2 cm spiculated right middle lobe pulmonary lesion is hypermetabolic with SUV max of 7.78, consistent with primary lung neoplasm. No enlarged or hypermetabolic mediastinal or hilar lymph nodes to suggest metastatic disease. Small internal mammary and epicardial lymph nodes are noted but no significant hypermetabolism. There also scattered axillary lymph nodes but no findings for FDG uptake. Incidental CT findings: Stable lingular and right middle lobe scarring changes. Stable underlying emphysematous changes. A few tiny scattered pulmonary nodules are stable and will require surveillance. ABDOMEN/PELVIS: No abnormal hypermetabolic activity within the liver, pancreas, adrenal glands, or spleen. No hypermetabolic lymph nodes in the abdomen or pelvis. Inflammatory type changes with thickening and edema involving the oblique abdominal musculature on the left side near its attachment on the iliac crest. Associated mild hypermetabolism with SUV max of 3.17. This is most likely a muscle injury. Attention on future studies is suggested. Correlation  with any pain or tenderness in this area may also be helpful. Moderate-sized area of hypermetabolism involving the anorectal junction area. No obvious mass on the CT scan. This could be due to internal hemorrhoids. Incidental CT findings: Stable splenomegaly. Atherosclerotic calcifications involving the aorta and branch vessels. Bilateral iliac artery stents are noted. SKELETON: No focal hypermetabolic activity to suggest skeletal metastasis. Incidental CT findings: none IMPRESSION: 1. 2 cm right middle lobe lung mass is hypermetabolic and consistent with primary lung neoplasm. 2. No findings for mediastinal/hilar lymphadenopathy or metastatic disease. 3. Areas of hypermetabolism involving the left oblique abdominal muscles and the anorectal junction as discussed above. Electronically Signed   By: Marijo Sanes M.D.   On: 11/22/2017 14:46    Assessment and Plan:   IVONE LICHT is a 62 y.o. y/o female has been referred for positive ANA in setting of AIHA and abnormal LFT's . Presently the LFT's are normal , they were acutely raised for a short period of time and going back historically the LFT's have been normal. 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 To complete evaluation I will check her F actin , LKM antibody , iron studies, celiac serelogy , A1AT, ceruloplasmin. At present since LFT's are stable would monitor closely.  In the event  Any of the  antibodies are positive in the tests I am ordering , the indication for treatment would be abnormal antibodies and elevated LFTs. Other differentials for acute rise in LFt's could include a drug reaction(recent antibiotics).At this time there is no biochemical or radiological evidence of cirrhosis   Follow up in 8 weeks   Dr Jonathon Bellows MD,MRCP(U.K)

## 2017-11-30 NOTE — Patient Instructions (Signed)
Complete labs at [facility] laboratory.

## 2017-12-01 ENCOUNTER — Ambulatory Visit (INDEPENDENT_AMBULATORY_CARE_PROVIDER_SITE_OTHER): Payer: BLUE CROSS/BLUE SHIELD | Admitting: Pulmonary Disease

## 2017-12-01 ENCOUNTER — Ambulatory Visit
Admission: RE | Admit: 2017-12-01 | Discharge: 2017-12-01 | Disposition: A | Discharge status: Home / Self Care | Payer: BLUE CROSS/BLUE SHIELD | Source: Ambulatory Visit | Attending: Pulmonary Disease | Admitting: Pulmonary Disease

## 2017-12-01 ENCOUNTER — Encounter: Payer: Self-pay | Admitting: *Deleted

## 2017-12-01 ENCOUNTER — Encounter: Payer: Self-pay | Admitting: Pulmonary Disease

## 2017-12-01 VITALS — BP 138/64 | HR 98 | Ht 63.5 in | Wt 166.0 lb

## 2017-12-01 DIAGNOSIS — R918 Other nonspecific abnormal finding of lung field: Secondary | ICD-10-CM

## 2017-12-01 DIAGNOSIS — Z87891 Personal history of nicotine dependence: Secondary | ICD-10-CM

## 2017-12-01 LAB — CELIAC DISEASE PANEL
ENDOMYSIAL ANTIBODY IGA: NEGATIVE
IGA: 306 mg/dL (ref 87–352)

## 2017-12-01 LAB — CERULOPLASMIN: CERULOPLASMIN: 35.7 mg/dL (ref 19.0–39.0)

## 2017-12-01 LAB — GLIA (IGA/G) + TTG IGA
Antigliadin Abs, IgA: 7 units (ref 0–19)
Gliadin IgG: 3 units (ref 0–19)
Tissue Transglutaminase Ab, IgA: <2 U/mL (ref 0–3)

## 2017-12-01 LAB — ANTI-SMOOTH MUSCLE ANTIBODY, IGG: F-Actin IgG: 34 Units — ABNORMAL HIGH (ref 0–19)

## 2017-12-01 LAB — ALPHA-1 ANTITRYPSIN PHENOTYPE: A-1 Antitrypsin, Ser: 176 mg/dL (ref 90–200)

## 2017-12-01 NOTE — Progress Notes (Signed)
  Oncology Nurse Navigator Documentation  Navigator Location: [facility] (12/01/17 1400)   )Navigator Encounter Type: Telephone (12/01/17 1400) Telephone: [name] Call;Appt Confirmation/Clarification (12/01/17 1400)                       Barriers/Navigation Needs: Coordination of Care (12/01/17 1400)   Interventions: Coordination of Care (12/01/17 1400)   Coordination of Care: Appts (12/01/17 1400)           phone call made to patient to schedule follow up appt with Dr. Mike Gip after bronchoscopy that is scheduled on 4/9. appt made for 4/15 at 11:15am. Pt is aware and confirmed appt. Instructed pt to call with any further questions or needs. Informed that results from bronchoscopy will be discussed at appt on 4/15 and treatment options will be reviewed again. Pt verbalized understanding. Nothing further needed at this time.        Time Spent with Patient: 30 (12/01/17 1400)

## 2017-12-01 NOTE — Patient Instructions (Signed)
Bronchoscopy planned for Tuesday, 12/07/17 Chest x-ray ordered for today Hold Plavix beginning Friday, 12/03/17 Follow-up will be arranged as needed

## 2017-12-01 NOTE — Progress Notes (Signed)
PULMONARY CONSULT NOTE  Requesting MD/Service: Mike Gip Date of initial consultation: 12/01/17 Reason for consultation: RML mass  PT PROFILE: 62 y.o. female former smoker referred for evaluation of incidentally found RML opacity concerning for malignancy   DATA: 11/10/17 CTAP: No intra-abdominal findings of note. 2.2 cm spiculated density is noted in right middle lobe concerning for malignancy. CT scan of the chest with intravenous contrast is recommended for further evaluation 11/16/17 CT chest: Spiculated R middle lobe mass 2.2 x 2.0 x 2.4 cm highly suspicious for a primary pulmonary neoplasm. Multiple tiny nonspecific upper lobe pulmonary nodules greater on R, largest 3 mm, of uncertain etiology; these could be postinflammatory but tiny metastatic foci are not excluded 11/22/17 PET: 2 cm right middle lobe lung mass is hypermetabolic and consistent with primary lung neoplasm. 2. No findings for mediastinal/hilar lymphadenopathy or metastatic disease 11/18/17 PFTs: FVC: 1.88 > 2.04 L (63 > 69 %pred),  FEV1: 1.22 > 1.23 L (51 %pred), FEV1/FVC: 65%, TLC: 4.00 L (83 %pred), DLCO 6.8, 32 %pred, DLCO/VA 55%    HPI:  51 F former smoker (3/4-1 PPD X 40 yrs, quit 2017) with hemolytic anemia followed by Dr Mike Gip who was found to have elevated LFTs in early 10/2017 which prompted a CTAP with findings as above which, int turn, prompted CT chest and PET scans. She is referred for diagnostic evaluation of RML mass. She describes no major pulmonary or respiratory symptoms. She lives a sedentary lifestyle but reports minimal DOE.  She denies significant cough, hemoptysis, chest pain, purulent sputum, lower extremity edema, calf tenderness, unexplained weight loss.  Past Medical History:  Diagnosis Date  . Anemia   . Atherosclerosis of native arteries of extremity with intermittent claudication ([place]) 07/20/2016  . COPD (chronic obstructive pulmonary disease) ([place])   . 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 Elevated liver function tests 11/03/2017  . Heme positive stool 01/29/2015  . HLD (hyperlipidemia)   . Hypertension   . 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 Thrombocytopenia ([place]) 10/04/2016    Past Surgical History:  Procedure Laterality Date  . ESOPHAGOGASTRODUODENOSCOPY (EGD) WITH PROPOFOL N/A 07/08/2016   Procedure: ESOPHAGOGASTRODUODENOSCOPY (EGD) WITH PROPOFOL;  Surgeon: Manya Silvas, MD;  Location: [facility] ENDOSCOPY;  Service: Endoscopy;  Laterality: N/A;  . KNEE SURGERY    . PERIPHERAL VASCULAR CATHETERIZATION N/A 07/10/2016   Procedure: Lower Extremity Angiography;  Surgeon: Katha Cabal, MD;  Location: [facility];  Service: Cardiovascular;  Laterality: N/A;  . PERIPHERAL VASCULAR CATHETERIZATION N/A 07/10/2016   Procedure: Abdominal Aortogram w/Lower Extremity;  Surgeon: Katha Cabal, MD;  Location: [facility];  Service: Cardiovascular;  Laterality: N/A;  . PERIPHERAL VASCULAR CATHETERIZATION  07/10/2016   Procedure: Lower Extremity Intervention;  Surgeon: Katha Cabal, MD;  Location: [place] [facility];  Service: Cardiovascular;;    MEDICATIONS: I have reviewed all medications and confirmed regimen as documented  Social History   Socioeconomic History  . Marital status: Widowed    Spouse name: Not on file  . Number of children: Not on file  . Years of education: Not on file  . Highest education level: Not on file  Occupational History  . Not on file  Social Needs  . Financial resource strain: Not on file  . Food insecurity:    Worry: Not on file    Inability: Not on file  . Transportation needs:    Medical: Not on file  Non-medical: Not on file  Tobacco Use  . Smoking status: Former Smoker    Packs/day: 0.50    Types: Cigarettes    Last attempt to quit: 07/11/2016    Years since quitting: 1.3  . Smokeless tobacco: Never Used  Substance and Sexual Activity  . Alcohol use: No   . Drug use: No  . Sexual activity: Not Currently    Birth control/protection: None  Lifestyle  . Physical activity:    Days per week: Not on file    Minutes per session: Not on file  . Stress: Not on file  Relationships  . Social connections:    Talks on phone: Not on file    Gets together: Not on file    Attends religious service: Not on file    Active member of club or organization: Not on file    Attends meetings of clubs or organizations: Not on file    Relationship status: Not on file  . Intimate partner violence:    Fear of current or ex partner: Not on file    Emotionally abused: Not on file    Physically abused: Not on file    Forced sexual activity: Not on file  Other Topics Concern  . Not on file  Social History Narrative  . Not on file    Family History  Problem Relation Age of Onset  . Diabetes Mother   . Hypertension Mother   . Diabetes Maternal Grandfather   . Hypertension Maternal Grandfather     ROS: No fever, myalgias/arthralgias, unexplained weight loss or weight gain No new focal weakness or sensory deficits No otalgia, hearing loss, visual changes, nasal and sinus symptoms, mouth and throat problems No neck pain or adenopathy No abdominal pain, N/V/D, diarrhea, change in bowel pattern No dysuria, change in urinary pattern   Vitals:   12/01/17 0934 12/01/17 0936  BP:  138/64  Pulse:  98  SpO2:  100%  Weight: 166 lb (75.3 kg)   Height: 5' 3.5" (1.613 m)      EXAM:  Gen: Pleasant, WDWN, No overt respiratory distress HEENT: NCAT, sclera white, oropharynx normal Neck: Supple without LAN, thyromegaly, JVD Lungs: breath sounds full without wheezes or other adventitious sounds Cardiovascular: RRR, no murmurs noted Abdomen: Soft, nontender, normal BS Ext: without clubbing, cyanosis, edema Neuro: CNs grossly intact, motor and sensory intact Skin: Limited exam, no lesions noted  DATA:   BMP Latest Ref Rng & Units 11/23/2017 11/17/2017  11/15/2017  Glucose 65 - 99 mg/dL 95 98 92  BUN 6 - 20 mg/dL 36(H) 39(H) 39(H)  Creatinine 0.44 - 1.00 mg/dL 1.48(H) 1.66(H) 1.74(H)  Sodium 135 - 145 mmol/L 137 135 133(L)  Potassium 3.5 - 5.1 mmol/L 4.6 4.6 4.6  Chloride 101 - 111 mmol/L 106 104 102  CO2 22 - 32 mmol/L 22 23 19(L)  Calcium 8.9 - 10.3 mg/dL 9.0 8.9 8.9    CBC Latest Ref Rng & Units 11/23/2017 11/17/2017 11/12/2017  WBC 3.6 - 11.0 K/uL 3.5(L) 4.0 5.1  Hemoglobin 12.0 - 16.0 g/dL 8.0(L) 7.9(L) 9.3(L)  Hematocrit 35.0 - 47.0 % 23.9(L) 23.6(L) 27.5(L)  Platelets 150 - 440 K/uL 158 157 170    CXR 12/01/17: Very vague opacity in right middle lobe area corresponding to CT scan finding.  There also appears to be a nodular density adjacent to the left heart border in the lingula.  I have personally reviewed all chest radiographs reported above including CXRs and CT chest unless otherwise indicated  IMPRESSION:     ICD-10-CM   1. Lung mass R91.8 DG Chest 2 View  2. Former smoker Z87.891   3. Moderate COPD - relatively asymptomatic   We reviewed the findings on x-rays during this visit.  I showed her all of the areas of concern as noted above.  I ordered a chest x-ray which has now been reviewed above to see if the opacity is visible on plain film.  This has implications regarding fluoroscopy aided bronchoscopic biopsy.  I think I do see it in the right middle lobe region.  We discussed options for obtaining a tissue diagnosis.  I believe that conventional bronchoscopy with fluoroscopic guidance has a high yield.  We also discussed the possibility of proceeding immediately to navigation bronchoscopy.  She agrees to proceed with conventional bronchoscopy first.  It is noted that she has a history of thrombocytopenia.  However, recently, her lately count was low normal (158,000).  She is on Plavix for peripheral vascular disease and this will need to be held for several days prior to procedure   PLAN:  Bronchoscopy is scheduled  for Tuesday, 12/07/17 with a plan for fluoroscopic guided trans-bronchial biopsy. She has been instructed to hold Plavix beginning Friday, 12/03/17. I have discussed with Dr. Genevive Bi Follow-up will be arranged after biopsy results are available.   Merton Border, MD PCCM service Mobile [phone] Pager [phone] 12/01/2017 1:33 PM

## 2017-12-01 NOTE — H&P (View-Only) (Signed)
PULMONARY CONSULT NOTE  Requesting MD/Service: Mike Gip Date of initial consultation: 12/01/17 Reason for consultation: RML mass  PT PROFILE: 62 y.o. female former smoker referred for evaluation of incidentally found RML opacity concerning for malignancy   DATA: 11/10/17 CTAP: No intra-abdominal findings of note. 2.2 cm spiculated density is noted in right middle lobe concerning for malignancy. CT scan of the chest with intravenous contrast is recommended for further evaluation 11/16/17 CT chest: Spiculated R middle lobe mass 2.2 x 2.0 x 2.4 cm highly suspicious for a primary pulmonary neoplasm. Multiple tiny nonspecific upper lobe pulmonary nodules greater on R, largest 3 mm, of uncertain etiology; these could be postinflammatory but tiny metastatic foci are not excluded 11/22/17 PET: 2 cm right middle lobe lung mass is hypermetabolic and consistent with primary lung neoplasm. 2. No findings for mediastinal/hilar lymphadenopathy or metastatic disease 11/18/17 PFTs: FVC: 1.88 > 2.04 L (63 > 69 %pred),  FEV1: 1.22 > 1.23 L (51 %pred), FEV1/FVC: 65%, TLC: 4.00 L (83 %pred), DLCO 6.8, 32 %pred, DLCO/VA 55%    HPI:  17 F former smoker (3/4-1 PPD X 40 yrs, quit 2017) with hemolytic anemia followed by Dr Mike Gip who was found to have elevated LFTs in early 10/2017 which prompted a CTAP with findings as above which, int turn, prompted CT chest and PET scans. She is referred for diagnostic evaluation of RML mass. She describes no major pulmonary or respiratory symptoms. She lives a sedentary lifestyle but reports minimal DOE.  She denies significant cough, hemoptysis, chest pain, purulent sputum, lower extremity edema, calf tenderness, unexplained weight loss.  Past Medical History:  Diagnosis Date  . Anemia   . Atherosclerosis of native arteries of extremity with intermittent claudication ([place]) 07/20/2016  . COPD (chronic obstructive pulmonary disease) ([place])   . 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 Elevated liver function tests 11/03/2017  . Heme positive stool 01/29/2015  . HLD (hyperlipidemia)   . Hypertension   . 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 Thrombocytopenia ([place]) 10/04/2016    Past Surgical History:  Procedure Laterality Date  . ESOPHAGOGASTRODUODENOSCOPY (EGD) WITH PROPOFOL N/A 07/08/2016   Procedure: ESOPHAGOGASTRODUODENOSCOPY (EGD) WITH PROPOFOL;  Surgeon: Manya Silvas, MD;  Location: [facility] ENDOSCOPY;  Service: Endoscopy;  Laterality: N/A;  . KNEE SURGERY    . PERIPHERAL VASCULAR CATHETERIZATION N/A 07/10/2016   Procedure: Lower Extremity Angiography;  Surgeon: Katha Cabal, MD;  Location: [facility];  Service: Cardiovascular;  Laterality: N/A;  . PERIPHERAL VASCULAR CATHETERIZATION N/A 07/10/2016   Procedure: Abdominal Aortogram w/Lower Extremity;  Surgeon: Katha Cabal, MD;  Location: [facility];  Service: Cardiovascular;  Laterality: N/A;  . PERIPHERAL VASCULAR CATHETERIZATION  07/10/2016   Procedure: Lower Extremity Intervention;  Surgeon: Katha Cabal, MD;  Location: [place] [facility];  Service: Cardiovascular;;    MEDICATIONS: I have reviewed all medications and confirmed regimen as documented  Social History   Socioeconomic History  . Marital status: Widowed    Spouse name: Not on file  . Number of children: Not on file  . Years of education: Not on file  . Highest education level: Not on file  Occupational History  . Not on file  Social Needs  . Financial resource strain: Not on file  . Food insecurity:    Worry: Not on file    Inability: Not on file  . Transportation needs:    Medical: Not on file  Non-medical: Not on file  Tobacco Use  . Smoking status: Former Smoker    Packs/day: 0.50    Types: Cigarettes    Last attempt to quit: 07/11/2016    Years since quitting: 1.3  . Smokeless tobacco: Never Used  Substance and Sexual Activity  . Alcohol use: No   . Drug use: No  . Sexual activity: Not Currently    Birth control/protection: None  Lifestyle  . Physical activity:    Days per week: Not on file    Minutes per session: Not on file  . Stress: Not on file  Relationships  . Social connections:    Talks on phone: Not on file    Gets together: Not on file    Attends religious service: Not on file    Active member of club or organization: Not on file    Attends meetings of clubs or organizations: Not on file    Relationship status: Not on file  . Intimate partner violence:    Fear of current or ex partner: Not on file    Emotionally abused: Not on file    Physically abused: Not on file    Forced sexual activity: Not on file  Other Topics Concern  . Not on file  Social History Narrative  . Not on file    Family History  Problem Relation Age of Onset  . Diabetes Mother   . Hypertension Mother   . Diabetes Maternal Grandfather   . Hypertension Maternal Grandfather     ROS: No fever, myalgias/arthralgias, unexplained weight loss or weight gain No new focal weakness or sensory deficits No otalgia, hearing loss, visual changes, nasal and sinus symptoms, mouth and throat problems No neck pain or adenopathy No abdominal pain, N/V/D, diarrhea, change in bowel pattern No dysuria, change in urinary pattern   Vitals:   12/01/17 0934 12/01/17 0936  BP:  138/64  Pulse:  98  SpO2:  100%  Weight: 166 lb (75.3 kg)   Height: 5' 3.5" (1.613 m)      EXAM:  Gen: Pleasant, WDWN, No overt respiratory distress HEENT: NCAT, sclera white, oropharynx normal Neck: Supple without LAN, thyromegaly, JVD Lungs: breath sounds full without wheezes or other adventitious sounds Cardiovascular: RRR, no murmurs noted Abdomen: Soft, nontender, normal BS Ext: without clubbing, cyanosis, edema Neuro: CNs grossly intact, motor and sensory intact Skin: Limited exam, no lesions noted  DATA:   BMP Latest Ref Rng & Units 11/23/2017 11/17/2017  11/15/2017  Glucose 65 - 99 mg/dL 95 98 92  BUN 6 - 20 mg/dL 36(H) 39(H) 39(H)  Creatinine 0.44 - 1.00 mg/dL 1.48(H) 1.66(H) 1.74(H)  Sodium 135 - 145 mmol/L 137 135 133(L)  Potassium 3.5 - 5.1 mmol/L 4.6 4.6 4.6  Chloride 101 - 111 mmol/L 106 104 102  CO2 22 - 32 mmol/L 22 23 19(L)  Calcium 8.9 - 10.3 mg/dL 9.0 8.9 8.9    CBC Latest Ref Rng & Units 11/23/2017 11/17/2017 11/12/2017  WBC 3.6 - 11.0 K/uL 3.5(L) 4.0 5.1  Hemoglobin 12.0 - 16.0 g/dL 8.0(L) 7.9(L) 9.3(L)  Hematocrit 35.0 - 47.0 % 23.9(L) 23.6(L) 27.5(L)  Platelets 150 - 440 K/uL 158 157 170    CXR 12/01/17: Very vague opacity in right middle lobe area corresponding to CT scan finding.  There also appears to be a nodular density adjacent to the left heart border in the lingula.  I have personally reviewed all chest radiographs reported above including CXRs and CT chest unless otherwise indicated  IMPRESSION:     ICD-10-CM   1. Lung mass R91.8 DG Chest 2 View  2. Former smoker Z87.891   3. Moderate COPD - relatively asymptomatic   We reviewed the findings on x-rays during this visit.  I showed her all of the areas of concern as noted above.  I ordered a chest x-ray which has now been reviewed above to see if the opacity is visible on plain film.  This has implications regarding fluoroscopy aided bronchoscopic biopsy.  I think I do see it in the right middle lobe region.  We discussed options for obtaining a tissue diagnosis.  I believe that conventional bronchoscopy with fluoroscopic guidance has a high yield.  We also discussed the possibility of proceeding immediately to navigation bronchoscopy.  She agrees to proceed with conventional bronchoscopy first.  It is noted that she has a history of thrombocytopenia.  However, recently, her lately count was low normal (158,000).  She is on Plavix for peripheral vascular disease and this will need to be held for several days prior to procedure   PLAN:  Bronchoscopy is scheduled  for Tuesday, 12/07/17 with a plan for fluoroscopic guided trans-bronchial biopsy. She has been instructed to hold Plavix beginning Friday, 12/03/17. I have discussed with Dr. Genevive Bi Follow-up will be arranged after biopsy results are available.   Merton Border, MD PCCM service Mobile [phone] Pager [phone] 12/01/2017 1:33 PM

## 2017-12-02 ENCOUNTER — Telehealth: Payer: Self-pay | Admitting: *Deleted

## 2017-12-02 LAB — ANTI-MICROSOMAL ANTIBODY LIVER / KIDNEY: LKM1 Ab: 1.3 Units (ref 0.0–20.0)

## 2017-12-02 NOTE — Telephone Encounter (Signed)
PROVIDER: Simonds PROCEDURE: Regular DATE: 12/07/17 TIME: 1pm

## 2017-12-03 ENCOUNTER — Other Ambulatory Visit: Payer: Self-pay | Admitting: Urgent Care

## 2017-12-03 ENCOUNTER — Ambulatory Visit: Payer: BLUE CROSS/BLUE SHIELD | Admitting: Gastroenterology

## 2017-12-03 DIAGNOSIS — D696 Thrombocytopenia, unspecified: Secondary | ICD-10-CM

## 2017-12-03 DIAGNOSIS — D591 Autoimmune hemolytic anemia, unspecified: Secondary | ICD-10-CM

## 2017-12-03 DIAGNOSIS — R945 Abnormal results of liver function studies: Secondary | ICD-10-CM

## 2017-12-03 DIAGNOSIS — R7989 Other specified abnormal findings of blood chemistry: Secondary | ICD-10-CM

## 2017-12-03 NOTE — Telephone Encounter (Signed)
Called and spoke with [place] at [facility] [place]. No PA required for procedure code [phone].  Call Ref # [id number].  Rhonda J Cobb

## 2017-12-04 LAB — IMMUNOGLOBULINS A/E/G/M, SERUM
IGA: 318 mg/dL (ref 87–352)
IGE (IMMUNOGLOBULIN E), SERUM: 2 [IU]/mL — AB (ref 6–495)
IGG (IMMUNOGLOBIN G), SERUM: 1191 mg/dL (ref 700–1600)
IGM (IMMUNOGLOBULIN M), SRM: 244 mg/dL — AB (ref 26–217)

## 2017-12-06 ENCOUNTER — Encounter
Admission: RE | Admit: 2017-12-06 | Discharge: 2017-12-06 | Disposition: A | Discharge status: Home / Self Care | Payer: BLUE CROSS/BLUE SHIELD | Source: Ambulatory Visit | Attending: Internal Medicine | Admitting: Internal Medicine

## 2017-12-06 ENCOUNTER — Other Ambulatory Visit: Payer: Self-pay

## 2017-12-06 DIAGNOSIS — Z79890 Hormone replacement therapy (postmenopausal): Secondary | ICD-10-CM | POA: Diagnosis not present

## 2017-12-06 DIAGNOSIS — J449 Chronic obstructive pulmonary disease, unspecified: Secondary | ICD-10-CM | POA: Diagnosis not present

## 2017-12-06 DIAGNOSIS — K219 Gastro-esophageal reflux disease without esophagitis: Secondary | ICD-10-CM | POA: Diagnosis not present

## 2017-12-06 DIAGNOSIS — Z87891 Personal history of nicotine dependence: Secondary | ICD-10-CM | POA: Diagnosis not present

## 2017-12-06 DIAGNOSIS — Z79899 Other long term (current) drug therapy: Secondary | ICD-10-CM | POA: Diagnosis not present

## 2017-12-06 DIAGNOSIS — J189 Pneumonia, unspecified organism: Secondary | ICD-10-CM | POA: Diagnosis not present

## 2017-12-06 DIAGNOSIS — Z7902 Long term (current) use of antithrombotics/antiplatelets: Secondary | ICD-10-CM | POA: Diagnosis not present

## 2017-12-06 DIAGNOSIS — R918 Other nonspecific abnormal finding of lung field: Secondary | ICD-10-CM | POA: Diagnosis present

## 2017-12-06 DIAGNOSIS — E785 Hyperlipidemia, unspecified: Secondary | ICD-10-CM | POA: Diagnosis not present

## 2017-12-06 DIAGNOSIS — E039 Hypothyroidism, unspecified: Secondary | ICD-10-CM | POA: Diagnosis not present

## 2017-12-06 DIAGNOSIS — I10 Essential (primary) hypertension: Secondary | ICD-10-CM | POA: Diagnosis not present

## 2017-12-06 DIAGNOSIS — I70219 Atherosclerosis of native arteries of extremities with intermittent claudication, unspecified extremity: Secondary | ICD-10-CM | POA: Diagnosis not present

## 2017-12-06 HISTORY — DX: Gastro-esophageal reflux disease without esophagitis: K21.9

## 2017-12-06 HISTORY — DX: Hypothyroidism, unspecified: E03.9

## 2017-12-06 NOTE — Telephone Encounter (Signed)
Per DK and DS procedure will be changed to a ENB. Pt has been scheduled for PAT at 12pm on 12/06/17. Procedure will still be 12/07/16 @ 1pm. Provider will be DK.

## 2017-12-06 NOTE — Telephone Encounter (Signed)
Called and spoke with Teacher, music at [place] [facility] [place]. No PA or PD is required for procedure code [phone] as long as done outpatient. Call Ref # [id number]. Spoke with Crystal at 11:03 on 12/06/17. Rhonda J Cobb

## 2017-12-06 NOTE — Patient Instructions (Signed)
Your procedure is scheduled on: December 07, 2017 Tuesday  Report to Day Surgery on the 2nd floor of the [facility]: Instructions that are not followed completely may result in serious medical risk, up to and including death; or upon the discretion of your surgeon and anesthesiologist your surgery may need to be rescheduled.  Do not eat food after midnight the night before your procedure.  No gum chewing, lozengers or hard candies.  You may however, drink CLEAR liquids up to 2 hours before you are scheduled to arrive for your surgery. Do not drink anything within 2 hours of the start of your surgery.  Clear liquids include: - water  - apple juice without pulp - clear gatorade - black coffee or tea (Do NOT add anything to the coffee or tea) Do NOT drink anything that is not on this list.  Type 1 and Type 2 diabetics should only drink water.  No Alcohol for 24 hours before or after surgery.  No Smoking including e-cigarettes for 24 hours prior to surgery.  No chewable tobacco products for at least 6 hours prior to surgery.  No nicotine patches on the day of surgery.  On the morning of surgery brush your teeth with toothpaste and water, you may rinse your mouth with mouthwash if you wish. Do not swallow any toothpaste or mouthwash.  Notify your doctor if there is any change in your medical condition (cold, fever, infection).  Do not wear jewelry, make-up, hairpins, clips or nail polish.  Do not wear lotions, powders, or perfumes. You may wear deodorant.  Do not shave 48 hours prior to surgery. Men may shave face and neck.  Contacts and dentures may not be worn into surgery.  Do not bring valuables to the hospital, including drivers license, insurance or credit cards.  [facility] is not responsible for any belongings or valuables.   TAKE THESE MEDICATIONS THE MORNING OF SURGERY: ALLOPURINOL LEVOTHYROXINE OMEPRAZOLE TAKE A DOSE THE NIGHT BEFORE SURGERY  AND A DOSE THE MORNING OF SURGERY  Use inhalers on the day of surgery and bring to the hospital.  Follow recommendations from Cardiologist, Pulmonologist or PCP regarding stopping Aspirin, Coumadin,PLAVIXEliquis, Pradaxa, or Pletal.STOPPED PLAVIX 12/03/2017  Stop Anti-inflammatories (NSAIDS) such as Advil, Aleve, Ibuprofen, Motrin, Naproxen, Naprosyn and Aspirin based products such as Excedrin, Goodys Powder, BC Powder. (May take Tylenol or Acetaminophen if needed.)  Stop ANY OVER THE COUNTER supplements until after surgery. (May continue Vitamin D, Vitamin B, and multivitamin.)  Wear comfortable clothing (specific to your surgery type) to the hospital.   If you are being discharged the day of surgery, you will not be allowed to drive home. You will need a responsible adult to drive you home and stay with you that night.   If you are taking public transportation, you will need to have a responsible adult with you. Please confirm with your physician that it is acceptable to use public transportation.   Please call [phone] if you have any questions about these instructions.

## 2017-12-07 ENCOUNTER — Ambulatory Visit: Payer: BLUE CROSS/BLUE SHIELD

## 2017-12-07 ENCOUNTER — Encounter: Payer: Self-pay | Admitting: Internal Medicine

## 2017-12-07 ENCOUNTER — Ambulatory Visit: Payer: BLUE CROSS/BLUE SHIELD | Admitting: Anesthesiology

## 2017-12-07 ENCOUNTER — Encounter
Admission: RE | Disposition: A | Discharge status: Home / Self Care | Payer: Self-pay | Source: Ambulatory Visit | Attending: Internal Medicine

## 2017-12-07 ENCOUNTER — Ambulatory Visit
Admission: RE | Admit: 2017-12-07 | Discharge: 2017-12-07 | Disposition: A | Discharge status: Home / Self Care | Payer: BLUE CROSS/BLUE SHIELD | Source: Ambulatory Visit | Attending: Internal Medicine | Admitting: Internal Medicine

## 2017-12-07 ENCOUNTER — Ambulatory Visit: Admit: 2017-12-07 | Payer: BLUE CROSS/BLUE SHIELD | Admitting: Internal Medicine

## 2017-12-07 ENCOUNTER — Other Ambulatory Visit: Payer: Self-pay | Admitting: Urgent Care

## 2017-12-07 DIAGNOSIS — J189 Pneumonia, unspecified organism: Secondary | ICD-10-CM | POA: Diagnosis not present

## 2017-12-07 DIAGNOSIS — I70219 Atherosclerosis of native arteries of extremities with intermittent claudication, unspecified extremity: Secondary | ICD-10-CM | POA: Insufficient documentation

## 2017-12-07 DIAGNOSIS — I10 Essential (primary) hypertension: Secondary | ICD-10-CM | POA: Insufficient documentation

## 2017-12-07 DIAGNOSIS — E039 Hypothyroidism, unspecified: Secondary | ICD-10-CM | POA: Insufficient documentation

## 2017-12-07 DIAGNOSIS — Z7902 Long term (current) use of antithrombotics/antiplatelets: Secondary | ICD-10-CM | POA: Insufficient documentation

## 2017-12-07 DIAGNOSIS — R918 Other nonspecific abnormal finding of lung field: Secondary | ICD-10-CM | POA: Diagnosis not present

## 2017-12-07 DIAGNOSIS — R911 Solitary pulmonary nodule: Secondary | ICD-10-CM

## 2017-12-07 DIAGNOSIS — Z79899 Other long term (current) drug therapy: Secondary | ICD-10-CM | POA: Insufficient documentation

## 2017-12-07 DIAGNOSIS — K219 Gastro-esophageal reflux disease without esophagitis: Secondary | ICD-10-CM | POA: Insufficient documentation

## 2017-12-07 DIAGNOSIS — E785 Hyperlipidemia, unspecified: Secondary | ICD-10-CM | POA: Insufficient documentation

## 2017-12-07 DIAGNOSIS — Z87891 Personal history of nicotine dependence: Secondary | ICD-10-CM | POA: Insufficient documentation

## 2017-12-07 DIAGNOSIS — Z79890 Hormone replacement therapy (postmenopausal): Secondary | ICD-10-CM | POA: Insufficient documentation

## 2017-12-07 DIAGNOSIS — J449 Chronic obstructive pulmonary disease, unspecified: Secondary | ICD-10-CM | POA: Insufficient documentation

## 2017-12-07 HISTORY — PX: ELECTROMAGNETIC NAVIGATION BROCHOSCOPY: SHX5369

## 2017-12-07 SURGERY — ELECTROMAGNETIC NAVIGATION BRONCHOSCOPY
Anesthesia: General

## 2017-12-07 SURGERY — ELECTROMAGNETIC NAVIGATION BRONCHOSCOPY
Anesthesia: Moderate Sedation

## 2017-12-07 MED ORDER — SUCCINYLCHOLINE CHLORIDE 20 MG/ML IJ SOLN
INTRAMUSCULAR | Status: DC | PRN
  Administered 2017-12-07: 100 mg via INTRAVENOUS

## 2017-12-07 MED ORDER — SUGAMMADEX SODIUM 200 MG/2ML IV SOLN
INTRAVENOUS | Status: AC
  Filled 2017-12-07: qty 2

## 2017-12-07 MED ORDER — ONDANSETRON HCL 4 MG/2ML IJ SOLN
INTRAMUSCULAR | Status: DC | PRN
  Administered 2017-12-07: 4 mg via INTRAVENOUS

## 2017-12-07 MED ORDER — LIDOCAINE HCL 2 % EX GEL
1.0000 "application " | Freq: Once | CUTANEOUS | Status: DC
  Filled 2017-12-07: qty 5

## 2017-12-07 MED ORDER — MIDAZOLAM HCL 5 MG/5ML IJ SOLN
INTRAMUSCULAR | Status: DC | PRN
  Administered 2017-12-07 (×2): 1 mg via INTRAVENOUS

## 2017-12-07 MED ORDER — PHENYLEPHRINE HCL 10 MG/ML IJ SOLN
INTRAMUSCULAR | Status: DC | PRN
  Administered 2017-12-07 (×2): 100 ug via INTRAVENOUS
  Administered 2017-12-07 (×2): 150 ug via INTRAVENOUS
  Administered 2017-12-07: 50 ug via INTRAVENOUS

## 2017-12-07 MED ORDER — ROCURONIUM BROMIDE 50 MG/5ML IV SOLN
INTRAVENOUS | Status: AC
  Filled 2017-12-07: qty 1

## 2017-12-07 MED ORDER — SUGAMMADEX SODIUM 200 MG/2ML IV SOLN
INTRAVENOUS | Status: DC | PRN
  Administered 2017-12-07: 150 mg via INTRAVENOUS

## 2017-12-07 MED ORDER — SODIUM CHLORIDE 0.9 % IJ SOLN
INTRAMUSCULAR | Status: AC
  Filled 2017-12-07: qty 20

## 2017-12-07 MED ORDER — MIDAZOLAM HCL 2 MG/2ML IJ SOLN
INTRAMUSCULAR | Status: AC
  Filled 2017-12-07: qty 2

## 2017-12-07 MED ORDER — PROPOFOL 10 MG/ML IV BOLUS
INTRAVENOUS | Status: DC | PRN
  Administered 2017-12-07: 150 mg via INTRAVENOUS

## 2017-12-07 MED ORDER — FENTANYL CITRATE (PF) 100 MCG/2ML IJ SOLN
INTRAMUSCULAR | Status: AC
  Filled 2017-12-07: qty 2

## 2017-12-07 MED ORDER — ROCURONIUM BROMIDE 100 MG/10ML IV SOLN
INTRAVENOUS | Status: DC | PRN
  Administered 2017-12-07: 5 mg via INTRAVENOUS
  Administered 2017-12-07: 15 mg via INTRAVENOUS

## 2017-12-07 MED ORDER — ONDANSETRON HCL 4 MG/2ML IJ SOLN: 4.0000 mg | Freq: Once | INTRAMUSCULAR | Status: DC | PRN

## 2017-12-07 MED ORDER — LIDOCAINE HCL (CARDIAC) 20 MG/ML IV SOLN
INTRAVENOUS | Status: DC | PRN
  Administered 2017-12-07: 60 mg via INTRAVENOUS

## 2017-12-07 MED ORDER — ONDANSETRON HCL 4 MG/2ML IJ SOLN
INTRAMUSCULAR | Status: AC
  Filled 2017-12-07: qty 2

## 2017-12-07 MED ORDER — DEXAMETHASONE SODIUM PHOSPHATE 10 MG/ML IJ SOLN
INTRAMUSCULAR | Status: DC | PRN
  Administered 2017-12-07: 5 mg via INTRAVENOUS

## 2017-12-07 MED ORDER — SUCCINYLCHOLINE CHLORIDE 20 MG/ML IJ SOLN
INTRAMUSCULAR | Status: AC
  Filled 2017-12-07: qty 1

## 2017-12-07 MED ORDER — LACTATED RINGERS IV SOLN
INTRAVENOUS | Status: DC
  Administered 2017-12-07: 12:00:00 via INTRAVENOUS

## 2017-12-07 MED ORDER — DEXAMETHASONE SODIUM PHOSPHATE 10 MG/ML IJ SOLN
INTRAMUSCULAR | Status: AC
  Filled 2017-12-07: qty 1

## 2017-12-07 MED ORDER — PROPOFOL 10 MG/ML IV BOLUS
INTRAVENOUS | Status: AC
  Filled 2017-12-07: qty 20

## 2017-12-07 MED ORDER — PHENYLEPHRINE HCL 10 MG/ML IJ SOLN
INTRAMUSCULAR | Status: AC
  Filled 2017-12-07: qty 1

## 2017-12-07 NOTE — Anesthesia Procedure Notes (Signed)
Procedure Name: Intubation Date/Time: 12/07/2017 1:17 PM Performed by: Dionne Bucy, CRNA Pre-anesthesia Checklist: Patient identified, Patient being monitored, Timeout performed, Emergency Drugs available and Suction available Patient Re-evaluated:Patient Re-evaluated prior to induction Oxygen Delivery Method: Circle system utilized Preoxygenation: Pre-oxygenation with 100% oxygen Induction Type: IV induction Ventilation: Mask ventilation without difficulty Laryngoscope Size: Mac and 3 Grade View: Grade I Tube type: Oral Tube size: 8.5 mm Number of attempts: 1 Airway Equipment and Method: Stylet Placement Confirmation: ETT inserted through vocal cords under direct vision,  positive ETCO2 and breath sounds checked- equal and bilateral Secured at: 21 cm Tube secured with: Tape Dental Injury: Teeth and Oropharynx as per pre-operative assessment

## 2017-12-07 NOTE — Interval H&P Note (Signed)
History and Physical Interval Note:  12/07/2017 12:57 PM  Tina Page  has presented today for surgery, with the diagnosis of lung mass  The various methods of treatment have been discussed with the patient and family. After consideration of risks, benefits and other options for treatment, the patient has consented to  Procedure(s): ELECTROMAGNETIC NAVIGATION BRONCHOSCOPY (N/A) as a surgical intervention .  The patient's history has been reviewed, patient examined, no change in status, stable for surgery.  I have reviewed the patient's chart and labs.  Questions were answered to the patient's satisfaction.     Flora Lipps

## 2017-12-07 NOTE — Transfer of Care (Signed)
Immediate Anesthesia Transfer of Care Note  Patient: Tina Page  Procedure(s) Performed: ELECTROMAGNETIC NAVIGATION BRONCHOSCOPY (N/A )  Patient Location: PACU  Anesthesia Type:General  Level of Consciousness: sedated  Airway & Oxygen Therapy: Patient Spontanous Breathing  Post-op Assessment: Report given to RN and Post -op Vital signs reviewed and stable  Post vital signs: Reviewed and stable  Last Vitals:  Vitals Value Taken Time  BP 128/52 12/07/2017  2:27 PM  Temp 36.4 C 12/07/2017  2:27 PM  Pulse 101 12/07/2017  2:29 PM  Resp 15 12/07/2017  2:29 PM  SpO2 97 % 12/07/2017  2:29 PM  Vitals shown include unvalidated device data.  Last Pain:  Vitals:   12/07/17 1427  TempSrc:   PainSc: 0-No pain         Complications: No apparent anesthesia complications

## 2017-12-07 NOTE — Anesthesia Post-op Follow-up Note (Signed)
Anesthesia QCDR form completed.        

## 2017-12-07 NOTE — Op Note (Signed)
Electromagnetic Navigation Bronchoscopy: Indication: lung mass/nodule  Preoperative Diagnosis:lung nodule/mass Post Procedure Diagnosis:lung nodule/mass Consent: Verbal/Written  The Risks and Benefits of the procedure explained to patient/family prior to start of procedure and I have discussed the risk for acute bleeding, increased chance of infection, increased chance of respiratory failure and cardiac arrest and death.  I have also explained to avoid all types of NSAIDs to decrease chance of bleeding, and to avoid food and drinks the midnight prior to procedure.  The procedure consists of a video camera with a light source to be placed and inserted  into the lungs to  look for abnormal tissue and to obtain tissue samples by using needle and biopsy tools.  The patient/family understand the risks and benefits and have agreed to proceed with procedure.   Hand washing performed prior to starting the procedure.   Type of Anesthesia: see Anesthesiology records .   Procedure Performed:  Virtual Bronchoscopy with Multi-planar Image analysis, 3-D reconstruction of coronal, sagittal and multi-planar images for the purposes of planning real-time bronchoscopy using the iLogic Electromagnetic Navigation Bronchoscopy System (superDimension).  Description of Procedure: After obtaining informed consent from the patient, the above sedative and anesthetic measures were carried out, flexible fiberoptic bronchoscope was inserted via Endotracheal tube after patient was intubated by CNA/Anesthesiologist.   The virtual camera was then placed into the central portion of the trachea. The trachea itself was inspected.  The main carina, right and left midstem bronchus and all the segmental and subsegmental airways by virtual bronchoscopy were inspected. The camera was directed to standard registration points at the following centers: main carina, right upper lobe bronchus, right lower lobe bronchus, right middle  lobe bronchus, left upper lobe bronchus, and the left lower lobe bronchus. This data was transferred to the i-Logic ENB system for real-time bronchoscopy.   The scope was then navigated to the RML mass for tissue sampling  Specimans Obtained:  Transbronchial Fine Needle Aspirations 21G times:3  Transbronchial Forceps Biopsy times:12  Transbronchial Triple Needle Brush:3    Fluoroscopy:  Fluoroscopy was utilized during the course of this procedure to assure that biopsies were taken in a safe manner under fluoroscopic guidance with spot films required.   Complications:None  Estimated Blood Loss: minimal approx 1cc  Monitoring:  The patient was monitored with continuous oximetry and received supplemental nasal cannula oxygen throughout the procedure. In addition, serial blood pressure measurements and continuous electrocardiography showed these physiologic parameters to remain tolerable throughout the procedure.   Assessment and Plan/Additional Comments: Follow up Pathology Reports    Corrin Parker, M.D.  [facility]  Medical Director [place] Director [facility] Cardio-Pulmonary Department

## 2017-12-07 NOTE — Anesthesia Preprocedure Evaluation (Addendum)
Anesthesia Evaluation  Patient identified by MRN, date of birth, ID band Patient awake    Reviewed: Allergy & Precautions, H&P , NPO status , reviewed documented beta blocker date and time   Airway Mallampati: III  TM Distance: >3 FB     Dental  (+) Edentulous Upper, Edentulous Lower   Pulmonary COPD, former smoker,     + decreased breath sounds      Cardiovascular hypertension, + Peripheral Vascular Disease  Normal cardiovascular exam     Neuro/Psych    GI/Hepatic GERD  Medicated and Controlled,(+) Hepatitis -  Endo/Other  Hypothyroidism   Renal/GU Renal disease     Musculoskeletal   Abdominal   Peds  Hematology  (+) anemia ,   Anesthesia Other Findings . Anemia  . Atherosclerosis of native arteries of extremity with intermittent claudication ([place]) 07/20/2016 . COPD (chronic obstructive pulmonary disease) ([place])  . 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 Elevated liver function tests 11/03/2017 . Heme positive stool 01/29/2015 . HLD (hyperlipidemia)  . Hypertension  . 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 Post PTCA 07/12/2016 . Thrombocytopenia ([place]) 10/04/2016   Past Surgical History: . ESOPHAGOGASTRODUODENOSCOPY (EGD) WITH PROPOFOL . KNEE SURGERY   . PERIPHERAL VASCULAR CATHETERIZATION N/A 07/10/2016 . PERIPHERAL VASCULAR CATHETERIZATION N/A 07/10/2016  . PERIPHERAL VASCULAR CATHETERIZATION  07/10/2016  Procedure: Lower Extremity Intervention  Reproductive/Obstetrics                            Anesthesia Physical Anesthesia Plan  ASA: III  Anesthesia Plan: General ETT   Post-op Pain Management:    Induction:   PONV Risk Score and Plan: 2 and Propofol infusion  Airway Management Planned:   Additional Equipment:   Intra-op Plan:   Post-operative Plan:   Informed Consent: I have reviewed the patients History and Physical, chart, labs and discussed  the procedure including the risks, benefits and alternatives for the proposed anesthesia with the patient or authorized representative who has indicated his/her understanding and acceptance.   Dental Advisory Given  Plan Discussed with: CRNA  Anesthesia Plan Comments:         Anesthesia Quick Evaluation

## 2017-12-07 NOTE — Discharge Instructions (Signed)
AMBULATORY SURGERY  DISCHARGE INSTRUCTIONS   1) The drugs that you were given will stay in your system until tomorrow so for the next 24 hours you should not:  A) Drive an automobile B) Make any legal decisions C) Drink any alcoholic beverage   2) You may resume regular meals tomorrow.  Today it is better to start with liquids and gradually work up to solid foods.  You may eat anything you prefer, but it is better to start with liquids, then soup and crackers, and gradually work up to solid foods.   3) Please notify your doctor immediately if you have any unusual bleeding, trouble breathing, redness and pain at the surgery site, drainage, fever, or pain not relieved by medication.    4) Additional Instructions:        Please contact your physician with any problems or Same Day Surgery at [phone], Monday through Friday 6 am to 4 pm, or [place] at [facility] number at [phone]. Flexible Bronchoscopy, Care After These instructions give you information on caring for yourself after your procedure. Your doctor may also give you more specific instructions. Call your doctor if you have any problems or questions after your procedure. Follow these instructions at home:  Do not eat or drink anything for 2 hours after your procedure. If you try to eat or drink before the medicine wears off, food or drink could go into your lungs. You could also burn yourself.  After 2 hours have passed and when you can cough and gag normally, you may eat soft food and drink liquids slowly.  The day after the test, you may eat your normal diet.  You may do your normal activities.  Keep all doctor visits. Get help right away if:  You get more and more short of breath.  You get light-headed.  You feel like you are going to pass out (faint).  You have chest pain.  You have new problems that worry you.  You cough up more than a little blood.  You cough up more blood than  before. This information is not intended to replace advice given to you by your health care provider. Make sure you discuss any questions you have with your health care provider. Document Released: 06/14/2009 Document Revised: 01/23/2016 Document Reviewed: 04/21/2013 Elsevier Interactive Patient Education  2017 [employer].

## 2017-12-08 NOTE — Anesthesia Postprocedure Evaluation (Signed)
Anesthesia Post Note  Patient: Tina Page  Procedure(s) Performed: ELECTROMAGNETIC NAVIGATION BRONCHOSCOPY (N/A )  Patient location during evaluation: PACU Anesthesia Type: General Level of consciousness: awake and alert Pain management: pain level controlled Vital Signs Assessment: post-procedure vital signs reviewed and stable Respiratory status: spontaneous breathing, nonlabored ventilation, respiratory function stable and patient connected to nasal cannula oxygen Cardiovascular status: blood pressure returned to baseline and stable Postop Assessment: no apparent nausea or vomiting Anesthetic complications: no     Last Vitals:  Vitals:   12/07/17 1509 12/07/17 1531  BP: (!) 132/54 121/65  Pulse: 81   Resp: 16 16  Temp: 36.7 C   SpO2: 100% 100%    Last Pain:  Vitals:   12/08/17 0823  TempSrc:   PainSc: 0-No pain                 Alphonsus Sias

## 2017-12-09 ENCOUNTER — Inpatient Hospital Stay: Payer: BLUE CROSS/BLUE SHIELD | Attending: Hematology and Oncology

## 2017-12-09 DIAGNOSIS — E039 Hypothyroidism, unspecified: Secondary | ICD-10-CM | POA: Insufficient documentation

## 2017-12-09 DIAGNOSIS — R918 Other nonspecific abnormal finding of lung field: Secondary | ICD-10-CM | POA: Insufficient documentation

## 2017-12-09 DIAGNOSIS — N289 Disorder of kidney and ureter, unspecified: Secondary | ICD-10-CM | POA: Diagnosis not present

## 2017-12-09 DIAGNOSIS — Z87891 Personal history of nicotine dependence: Secondary | ICD-10-CM | POA: Insufficient documentation

## 2017-12-09 DIAGNOSIS — D696 Thrombocytopenia, unspecified: Secondary | ICD-10-CM

## 2017-12-09 DIAGNOSIS — B1910 Unspecified viral hepatitis B without hepatic coma: Secondary | ICD-10-CM | POA: Insufficient documentation

## 2017-12-09 DIAGNOSIS — R945 Abnormal results of liver function studies: Secondary | ICD-10-CM | POA: Diagnosis not present

## 2017-12-09 DIAGNOSIS — J449 Chronic obstructive pulmonary disease, unspecified: Secondary | ICD-10-CM | POA: Insufficient documentation

## 2017-12-09 DIAGNOSIS — D591 Autoimmune hemolytic anemia, unspecified: Secondary | ICD-10-CM

## 2017-12-09 DIAGNOSIS — L930 Discoid lupus erythematosus: Secondary | ICD-10-CM | POA: Insufficient documentation

## 2017-12-09 DIAGNOSIS — R7989 Other specified abnormal findings of blood chemistry: Secondary | ICD-10-CM

## 2017-12-09 LAB — HEPATIC FUNCTION PANEL
ALK PHOS: 79 U/L (ref 38–126)
ALT: 10 U/L — AB (ref 14–54)
AST: 14 U/L — AB (ref 15–41)
Albumin: 4 g/dL (ref 3.5–5.0)
BILIRUBIN DIRECT: 0.2 mg/dL (ref 0.1–0.5)
BILIRUBIN INDIRECT: 0.7 mg/dL (ref 0.3–0.9)
Total Bilirubin: 0.9 mg/dL (ref 0.3–1.2)
Total Protein: 7.1 g/dL (ref 6.5–8.1)

## 2017-12-09 LAB — LACTATE DEHYDROGENASE: LDH: 165 U/L (ref 98–192)

## 2017-12-09 LAB — CBC WITH DIFFERENTIAL/PLATELET
BASOS PCT: 2 %
Basophils Absolute: 0.1 10*3/uL (ref 0–0.1)
Eosinophils Absolute: 0.2 10*3/uL (ref 0–0.7)
Eosinophils Relative: 3 %
HEMATOCRIT: 27.4 % — AB (ref 35.0–47.0)
HEMOGLOBIN: 9 g/dL — AB (ref 12.0–16.0)
LYMPHS ABS: 1 10*3/uL (ref 1.0–3.6)
LYMPHS PCT: 22 %
MCH: 32.3 pg (ref 26.0–34.0)
MCHC: 32.9 g/dL (ref 32.0–36.0)
MCV: 98.1 fL (ref 80.0–100.0)
MONO ABS: 0.4 10*3/uL (ref 0.2–0.9)
MONOS PCT: 8 %
NEUTROS ABS: 3.2 10*3/uL (ref 1.4–6.5)
NEUTROS PCT: 65 %
Platelets: 153 10*3/uL (ref 150–440)
RBC: 2.79 MIL/uL — ABNORMAL LOW (ref 3.80–5.20)
RDW: 17 % — AB (ref 11.5–14.5)
WBC: 4.9 10*3/uL (ref 3.6–11.0)

## 2017-12-09 LAB — CYTOLOGY - NON PAP

## 2017-12-09 LAB — SURGICAL PATHOLOGY

## 2017-12-09 LAB — RETICULOCYTES
RBC.: 2.84 MIL/uL — AB (ref 3.80–5.20)
Retic Count, Absolute: 187.4 10*3/uL — ABNORMAL HIGH (ref 19.0–183.0)
Retic Ct Pct: 6.6 % — ABNORMAL HIGH (ref 0.4–3.1)

## 2017-12-10 ENCOUNTER — Other Ambulatory Visit: Payer: Self-pay | Admitting: Internal Medicine

## 2017-12-10 LAB — HAPTOGLOBIN

## 2017-12-10 MED ORDER — PREDNISONE 20 MG PO TABS: 10.0000 mg | ORAL_TABLET | Freq: Every day | ORAL | 1 refills | Status: DC

## 2017-12-10 NOTE — Progress Notes (Unsigned)
I called patient and relayed pathology results +Oraganizing pneumonia  Negative for malignancy  Plan for prednisone 10 mg daily and repeat CT chest in 6-8 weeks   Patient satisfied with Plan of action and management. All questions answered  Corrin Parker, M.D.  [facility]  Medical Director [place] Director [facility] Cardio-Pulmonary Department

## 2017-12-11 NOTE — Progress Notes (Signed)
Cardiology Office Note  Date:  12/13/2017   ID:  Tina Page, DOB 1956/07/02, MRN [id number]  PCP:  Frazier Richards, MD   Chief Complaint  Patient presents with  . New Patient (Initial Visit)    Per Dr. Genevive Bi. Patient denies chest pain and SOB.  Meds reviewed verbally with patient.     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 Nestor Lewandowsky for consultation of her scattered aorta atherosclerosis, coronary arteries, great vessels  Went on a cruise Got viral URI, 10/2017  CT 10/2017, follow-up on a prior CT from November 2017 Spiculated RIGHT middle lobe mass 2.2 x 2.0 x 2.4 cm highly suspicious for a primary pulmonary neoplasm.  PET scan 2 cm right middle lobe lung mass is hypermetabolic and consistent with primary lung neoplasm.  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  EKG personally reviewed by myself on todays visit Shows normal sinus rhythm with rate 85 bpm no significant ST or T wave changes  CT scan images pulled up in the office and reviewed showing moderate aortic atherosclerosis particularly in the arch At least moderate coronary calcification distal left main or bifurcation, proximal LAD, RCA  PMH:   has a past medical history of Anemia, Atherosclerosis of native arteries of extremity with intermittent claudication ([place]) (07/20/2016), COPD (chronic obstructive pulmonary disease) ([place]), 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  PSH:    Past Surgical History:  Procedure Laterality Date  . ELECTROMAGNETIC NAVIGATION BROCHOSCOPY N/A 12/07/2017   Procedure: ELECTROMAGNETIC NAVIGATION BRONCHOSCOPY;  Surgeon: Flora Lipps, MD;  Location: [facility] ORS;  Service: Cardiopulmonary;  Laterality: N/A;  . ESOPHAGOGASTRODUODENOSCOPY (EGD) WITH PROPOFOL N/A 07/08/2016   Procedure: ESOPHAGOGASTRODUODENOSCOPY (EGD) WITH PROPOFOL;  Surgeon: Manya Silvas, MD;  Location: [facility] ENDOSCOPY;  Service: Endoscopy;  Laterality: N/A;  . KNEE SURGERY Right    repair of acl tear  . PERIPHERAL VASCULAR CATHETERIZATION N/A 07/10/2016   Procedure: Lower Extremity Angiography;  Surgeon: Katha Cabal, MD;  Location: [place] CV LAB;  Service: Cardiovascular;  Laterality: N/A;  . PERIPHERAL VASCULAR CATHETERIZATION N/A 07/10/2016   Procedure: Abdominal Aortogram w/Lower Extremity;  Surgeon: Katha Cabal, MD;  Location: [place] CV LAB;  Service: Cardiovascular;  Laterality: N/A;  . PERIPHERAL VASCULAR CATHETERIZATION  07/10/2016   Procedure: Lower Extremity Intervention;  Surgeon: Katha Cabal, MD;  Location: [place] CV LAB;  Service: Cardiovascular;;    Current Outpatient Medications  Medication Sig Dispense Refill  . albuterol (PROVENTIL HFA;VENTOLIN HFA) 108 (90 Base) MCG/ACT inhaler Inhale 2 puffs into the lungs every 6 (six) hours as needed for wheezing or shortness of breath.     . allopurinol (ZYLOPRIM) 100 MG tablet TAKE 1 TABLET BY MOUTH EVERY DAY 90 tablet 1  . clopidogrel (PLAVIX) 75 MG tablet Take 1 tablet (75 mg total) by mouth daily. 30 tablet 5  .  cyanocobalamin 500 MCG tablet Take 500 mcg by mouth daily.    [name] Kitchen docusate sodium (COLACE) 100 MG capsule  Take 1 capsule (100 mg total) by mouth 2 (two) times daily. (Patient taking differently: Take 100 mg by mouth daily. ) 10 capsule 0  . folic acid (FOLVITE) 1 MG tablet TAKE 1 TABLET (1 MG TOTAL) BY MOUTH DAILY. 90 tablet 1  . hydrochlorothiazide (HYDRODIURIL) 25 MG tablet Take 25 mg by mouth daily.    [name] Kitchen levothyroxine (SYNTHROID, LEVOTHROID) 50 MCG tablet Take 50 mcg by mouth daily before breakfast.     . lisinopril (PRINIVIL,ZESTRIL) 20 MG tablet Take 20 mg by mouth daily.     [name] Kitchen omeprazole (PRILOSEC) 20 MG capsule Take 1 capsule (20 mg total) by mouth daily. 60 capsule 0  . potassium chloride (KLOR-CON) 20 MEQ packet Take 20 mEq by mouth daily.    . pravastatin (PRAVACHOL) 40 MG tablet Take 40 mg by mouth every evening.    . predniSONE (DELTASONE) 20 MG tablet Take 0.5 tablets (10 mg total) by mouth daily. 30 tablet 1   No current facility-administered medications for this visit.      Allergies:   Codeine   Social History:  The patient  reports that she quit smoking about 17 months ago. Her smoking use included cigarettes. She smoked 0.50 packs per day. She has never used smokeless tobacco. She reports that she does not drink alcohol or use drugs.   Family History:   family history includes Diabetes in her maternal grandfather and mother; Hypertension in her maternal grandfather and mother.    Review of Systems: Review of Systems  Constitutional: Negative.   Respiratory: Positive for shortness of breath.   Cardiovascular: Negative.   Gastrointestinal: Negative.   Musculoskeletal: Negative.   Neurological: Negative.   Psychiatric/Behavioral: Negative.   All other systems reviewed and are negative.    PHYSICAL EXAM: VS:  BP 128/60 (BP Location: Left Arm, Patient Position: Sitting, Cuff Size: Normal)   Pulse 85   Ht 5\' 3"  (1.6 m)   Wt 165 lb 4 oz (75 kg)   BMI 29.27 kg/m  , BMI Body mass index is 29.27 kg/m. GEN: Well nourished, well developed, in no acute distress  HEENT:  normal  Neck: no JVD, carotid bruits, or masses Cardiac: RRR; no murmurs, rubs, or gallops,no edema  Respiratory:  clear to auscultation bilaterally, normal work of breathing GI: soft, nontender, nondistended, + BS MS: no deformity or atrophy  Skin: warm and dry, no rash Neuro:  Strength and sensation are intact Psych: euthymic mood, full affect    Recent Labs: 11/23/2017: BUN 36; Creatinine, Ser 1.48; Potassium 4.6; Sodium 137 12/13/2017: ALT 10; Hemoglobin 9.7; Platelets 174    Lipid Panel No results found for: CHOL, HDL, LDLCALC, TRIG    Wt Readings from Last 3 Encounters:  12/13/17 165 lb 4 oz (75 kg)  12/13/17 163 lb (73.9 kg)  12/07/17 166 lb (75.3 kg)       ASSESSMENT AND PLAN:  Aortic atherosclerosis ([place]) - Plan: EKG 12-Lead Moderate diffuse aortic atherosclerosis noted We have requested previous lipid panel from primary care Goal LDL less than 70  PAD (peripheral artery disease) ([place]) - Plan: EKG 12-Lead Stenting to lower extremities with Dr. Delana Meyer Goal LDL less than 70 Stop smoking several years ago  Mixed hyperlipidemia Records have been requested from primary care  Lung mass Long discussion concerning lesion in her lung Closely monitored by pulmonary, seen on CT scan and  PET scan concerning for malignancy Has follow-up CT scans to make sure it resolves   Shortness of breath - Plan: NM Myocar Multi W/Spect W/Wall Motion / EF Long history of smoking Significant coronary calcification seen on CT scan Unable to treadmill secondary to shortness of breath symptoms Recommended pharmacologic Myoview to rule out ischemia  Pre-operative cardiovascular examination - Plan: NM Myocar Multi W/Spect W/Wall Motion / EF Uncertain if surgery will be needed for her lung mass Will rule out ischemia with stress testing as above  Disposition:   F/U 12 months   Total encounter time more than 60 minutes  Greater than 50% was spent in counseling and coordination of  care with the patient  Patient was seen in consultation for Dr. Faith Rogue and will be referred back to his clinic for ongoing care of the issues detailed above    Orders Placed This Encounter  Procedures  . NM Myocar Multi W/Spect W/Wall Motion / EF  . EKG 12-Lead     Signed, Esmond Plants, M.D., Ph.D. 12/13/2017  [place], [place]

## 2017-12-12 NOTE — Progress Notes (Signed)
[facility] day:  12/13/2017  Chief Complaint: Tina Page is a 62 y.o. female with a a right middle lobe mass, warm autoimmune hemolytic anemia, and renal insufficiency who is seen for assessment and review of pathology from interval bronchoscopy.  HPI:  The patient was last seen in the hematology clinic on 11/23/2017.  At that time, patient was doing well overall. She denied any acute symptoms. She described no significant change in urine color appreciated. She denied B symptoms. Exam was stable. WBC 3500 with an ANC of 2300. Hemoglobin was 8.0, hematocrit 23.9, and platelets 158,000. LDH 222. She was referred to pulmonary medicine for bronchoscopy and biopsy of RML lung mass.   She was seen in consult by Dr. Nestor Lewandowsky (thoracic surgery) on 11/26/2017. PET scan reviewed and mass felt to be consistent with primary lung cancer. Surgical options reviewed, however prior to surgical intervention, Dr. Genevive Bi wanted patient to be seen by pulmonary and cardiology due to poor PFTs. Patient was to follow up with Dr. Genevive Bi after consult with pulmonology and cardiology.   She was seen in consult by Dr. Jonathon Bellows on 11/30/2017 for evaluation of abnormal LFTs in the setting of patient with an AIHA diagnosis. Secondary hepatic workup ordered. Anti-smooth muscle Ab elevated at 34 (0 -19). Ferritin was elevated at 667.  IgM was elevated at 244 (26 - 217 mg/dL). IgE was low at 2 (6 - 495 mg/dL).  LFTs had returned to normal. Previous acute transaminitis was felt to be likely related to recent course of antibiotics that patient was prescribed for an influenza infection.  Patient scheduled to follow up with GI in 8 weeks.   She seen in consult by Dr. Merton Border on 12/01/2017. PET scan reviewed. Plans were to proceed with a transbronchial biopsy of the patient's RML lung mass. After further discussion with other pulmonologists, the planned procedure was changed to an  [place].    Patient underwent the planned ENB on 12/07/2017 by Dr. Mortimer Fries. Cytology was negative for malignancy.  Pathology demonstrated organizing pneumonia and chronic bronchitis. Pathologist commented that organizing pneumonia spanned about 2 mm in one fragment.  Cases of focal organizing pneumonia with hypermetabolism on FDG-PET have been described, but changes of this type can also be adjacent to a neoplasm. Patient prescribed a daily dose of Prednisone 10 mg.  Repeat CT imaging was scheduled in 6-8 weeks.   Labs on 12/09/2017 revealed a WBC 4900 with an [place] of 3200. Hemoglobin was 9.0, hematocrit 27.4, MCV 98.1, and platelets 153,000. Reticulocyte count was 6.6%. LDH normal at 165 (previously 222). Hepatic function panel normal.  Haptoglobin low < 10 (34 - 200 mg/dL).   Patient is scheduled to see Dr. Holley Raring (nephrology) on 04/23 or 04/24 for evaluation on her progressive decrease in renal function.   In the interim, patient has been doing "ok". Patient notes that her breathing is better. She noted a slight productive cough following her ENB. She denies shortness of breath. Patient denies any B symptoms.  She is eating well. Weight is down 3 pounds.   Patient denies pain in the clinic today.    Past Medical History:  Diagnosis Date  . Anemia   . Atherosclerosis of native arteries of extremity with intermittent claudication ([place]) 07/20/2016  . COPD (chronic obstructive pulmonary disease) ([place])   . 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 Elevated liver function tests 11/03/2017  . GERD (gastroesophageal reflux disease)   . Heme positive  stool 01/29/2015  . HLD (hyperlipidemia)   . Hypertension   . Hypothyroidism   . 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 Thrombocytopenia ([place]) 10/04/2016    Past Surgical History:  Procedure Laterality Date  . ELECTROMAGNETIC NAVIGATION BROCHOSCOPY N/A 12/07/2017   Procedure: ELECTROMAGNETIC NAVIGATION BRONCHOSCOPY;   Surgeon: Flora Lipps, MD;  Location: [facility] ORS;  Service: Cardiopulmonary;  Laterality: N/A;  . ESOPHAGOGASTRODUODENOSCOPY (EGD) WITH PROPOFOL N/A 07/08/2016   Procedure: ESOPHAGOGASTRODUODENOSCOPY (EGD) WITH PROPOFOL;  Surgeon: Manya Silvas, MD;  Location: [facility] ENDOSCOPY;  Service: Endoscopy;  Laterality: N/A;  . KNEE SURGERY Right    repair of acl tear  . PERIPHERAL VASCULAR CATHETERIZATION N/A 07/10/2016   Procedure: Lower Extremity Angiography;  Surgeon: Katha Cabal, MD;  Location: [place] CV LAB;  Service: Cardiovascular;  Laterality: N/A;  . PERIPHERAL VASCULAR CATHETERIZATION N/A 07/10/2016   Procedure: Abdominal Aortogram w/Lower Extremity;  Surgeon: Katha Cabal, MD;  Location: [place] CV LAB;  Service: Cardiovascular;  Laterality: N/A;  . PERIPHERAL VASCULAR CATHETERIZATION  07/10/2016   Procedure: Lower Extremity Intervention;  Surgeon: Katha Cabal, MD;  Location: [place] CV LAB;  Service: Cardiovascular;;    Family History  Problem Relation Age of Onset  . Diabetes Mother   . Hypertension Mother   . Diabetes Maternal Grandfather   . Hypertension Maternal Grandfather     Social History:  reports that she quit smoking about 17 months ago. Her smoking use included cigarettes. She smoked 0.50 packs per day. She has never used smokeless tobacco. She reports that she does not drink alcohol or use drugs.  She has a 21 pack year smoking history (1/2 pack/day from age [age]).  The patient works at [employer]  She is exposed to cold temperatures.  She works 3rd shift.  She has a daughter, Tina Page and a daughter named Tina Page.  She is accompanied by Tina Page today.   Allergies:  Allergies  Allergen Reactions  . Codeine Anaphylaxis    Current Medications: Current Outpatient Medications  Medication Sig Dispense Refill  . allopurinol (ZYLOPRIM) 100 MG tablet TAKE 1 TABLET BY MOUTH EVERY DAY 90 tablet 1  . clopidogrel (PLAVIX) 75 MG  tablet Take 1 tablet (75 mg total) by mouth daily. 30 tablet 5  . cyanocobalamin 500 MCG tablet Take 500 mcg by mouth daily.    [name] Kitchen docusate sodium (COLACE) 100 MG capsule Take 1 capsule (100 mg total) by mouth 2 (two) times daily. (Patient taking differently: Take 100 mg by mouth daily. ) 10 capsule 0  . folic acid (FOLVITE) 1 MG tablet TAKE 1 TABLET (1 MG TOTAL) BY MOUTH DAILY. 90 tablet 1  . hydrochlorothiazide (HYDRODIURIL) 25 MG tablet Take 25 mg by mouth daily.    [name] Kitchen levothyroxine (SYNTHROID, LEVOTHROID) 50 MCG tablet Take 50 mcg by mouth daily before breakfast.     . lisinopril (PRINIVIL,ZESTRIL) 20 MG tablet Take 20 mg by mouth daily.     [name] Kitchen omeprazole (PRILOSEC) 20 MG capsule Take 1 capsule (20 mg total) by mouth daily. 60 capsule 0  . potassium chloride (KLOR-CON) 20 MEQ packet Take 20 mEq by mouth daily.    . pravastatin (PRAVACHOL) 40 MG tablet Take 40 mg by mouth every evening.    . predniSONE (DELTASONE) 20 MG tablet Take 0.5 tablets (10 mg total) by mouth daily. 30 tablet 1  . albuterol (PROVENTIL HFA;VENTOLIN HFA) 108 (90 Base) MCG/ACT inhaler Inhale 2 puffs into  the lungs every 6 (six) hours as needed for wheezing or shortness of breath.      No current facility-administered medications for this visit.     Review of Systems:  GENERAL:  Feels "ok".  No fevers, chills, sweats.  Weight down 3 pounds. PERFORMANCE STATUS (ECOG):  1 HEENT:  No visual changes, runny nose, sore throat, mouth sores or tenderness. Lungs: No shortness of breath.  Slight cough following ENB.  No hemoptysis. Cardiac:  No chest pain, palpitations, orthopnea, or PND. GI:  No nausea, vomiting, diarrhea, constipation, melena or hematochezia. GU:  No urgency, frequency, dysuria, or hematuria. Musculoskeletal:  No back pain.  No joint pain.  No muscle tenderness. Extremities:  No pain or swelling. Skin:  Discoid lupus.  No rashes or skin changes. Neuro:  No headache, numbness or weakness, balance or coordination  issues. Endocrine:  No diabetes, thyroid issues, hot flashes or night sweats. Psych:  No mood changes, depression or anxiety. Pain:  No focal pain. Review of systems:  All other systems reviewed and found to be negative.   Physical Exam: Blood pressure (!) 151/81, pulse 84, temperature (!) 95.2 F (35.1 C), temperature source Tympanic, resp. rate 20, weight 163 lb (73.9 kg). GENERAL:  Well developed, well nourished, woman sitting comfortably in the exam room in no acute distress. MENTAL STATUS:  Alert and oriented to person, place and time. HEAD:  [name] hair.  Normocephalic, atraumatic, face symmetric, no Cushingoid features. EYES:  Black rimmed glasses.  Blue eyes.  Pupils equal round and reactive to light and accomodation.  No conjunctivitis or scleral icterus. ENT:  Oropharynx clear without lesion.  Tongue normal. Mucous membranes moist.  RESPIRATORY:  Clear to auscultation without rales, wheezes or rhonchi. CARDIOVASCULAR:  Regular rate and rhythm without murmur, rub or gallop. ABDOMEN:  Soft, non-tender, with active bowel sounds, and no hepatosplenomegaly.  No masses. SKIN:  No rashes, ulcers or lesions. EXTREMITIES: No edema, no skin discoloration or tenderness.  No palpable cords. LYMPH NODES: No palpable cervical, supraclavicular, axillary or inguinal adenopathy  NEUROLOGICAL: Unremarkable. PSYCH:  Appropriate.   Appointment on 12/13/2017  Component Date Value Ref Range Status  . LDH 12/13/2017 165  98 - 192 U/L Final   Performed at [facility], [address] [place], [place] [address]  . Retic Ct Pct 12/13/2017 6.2* 0.4 - 3.1 % Final  . RBC. 12/13/2017 3.02* 3.80 - 5.20 MIL/uL Final  . Retic Count, Absolute 12/13/2017 187.2* 19.0 - 183.0 K/uL Final   Performed at [facility], [address] [place], [place] [address]  . Total Protein 12/13/2017 7.4  6.5 - 8.1 g/dL Final  . Albumin 12/13/2017 4.1  3.5 - 5.0 g/dL Final  . AST 12/13/2017 16  15 - 41 U/L  Final  . ALT 12/13/2017 10* 14 - 54 U/L Final  . Alkaline Phosphatase 12/13/2017 84  38 - 126 U/L Final  . Total Bilirubin 12/13/2017 0.7  0.3 - 1.2 mg/dL Final  . Bilirubin, Direct 12/13/2017 0.1  0.1 - 0.5 mg/dL Final  . Indirect Bilirubin 12/13/2017 0.6  0.3 - 0.9 mg/dL Final   Performed at [facility], [address], [place], [place] [address]  . WBC 12/13/2017 5.4  3.6 - 11.0 K/uL Final  . RBC 12/13/2017 2.99* 3.80 - 5.20 MIL/uL Final  . Hemoglobin 12/13/2017 9.7* 12.0 - 16.0 g/dL Final  . HCT 12/13/2017 29.2* 35.0 - 47.0 % Final  . MCV 12/13/2017 97.5  80.0 - 100.0  fL Final  . MCH 12/13/2017 32.3  26.0 - 34.0 pg Final  . MCHC 12/13/2017 33.2  32.0 - 36.0 g/dL Final  . RDW 12/13/2017 17.0* 11.5 - 14.5 % Final  . Platelets 12/13/2017 174  150 - 440 K/uL Final  . Neutrophils Relative % 12/13/2017 79  % Final  . Neutro Abs 12/13/2017 4.3  1.4 - 6.5 K/uL Final  . Lymphocytes Relative 12/13/2017 13  % Final  . Lymphs Abs 12/13/2017 0.7* 1.0 - 3.6 K/uL Final  . Monocytes Relative 12/13/2017 5  % Final  . Monocytes Absolute 12/13/2017 0.3  0.2 - 0.9 K/uL Final  . Eosinophils Relative 12/13/2017 2  % Final  . Eosinophils Absolute 12/13/2017 0.1  0 - 0.7 K/uL Final  . Basophils Relative 12/13/2017 1  % Final  . 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 One week prior to presentation, she felt like she was coming down with something. She denied any fever, runny nose, sore throat or cough. She had some diarrhea. She denied any new medications or herbal products.  Anemia workup revealed a cold autoantibody (IgG and complement).  Reticulocyte count was 11.3%.  Ferritin was 562.  Iron studies revealed a saturation of 20% and a TIBC  of 214 (low).  B12 was 231 (low normal).  Folate was 42.   Peripheral smear revealed rouleaux formation.  Additional testing included the following + studies:  hepatitis C antibody, hepatitis B core antibody, CMV IgM, and EBV VCA (IgM and IgG).  Hepatitis B by PCR was negative.  Hepatitis C RNA was negative.  Mycoplasma pneumonia IgM was negative.  Reticulocyte count was 11.3% (high) indicating appropriate marrow response.  C3 and C4 were normal.  Cold agglutinin titer was negative x 2.  Negative studies included:  ANA, hepatitis B surface antigen, SPEP, and free light chain ratio.   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  MMA was initially 330 (normal).  Repeat MMA was elevated on 08/01/2016 confirming B12 deficiency.  Hepatitis B surface antibody was positive and hepatitis B surface antigen was negative on 08/06/2016.  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 There was no adenopathy. Spleen was normal.  Abdominal ultrasound on 04/15/2017 revealed a normal spleen and sludge in the gallbladder.  The liver was echogenic consistent fatty infiltration and/or hepatocellular disease.  She underwent PTCA and stent placement in right and left common iliac arteries and left external iliac artery on 07/10/2016.  She is on Plavix.  EGD on 03/19/2015 revealed gastritis in the body and antrum. Colonoscopy on 03/19/2015 revealed one hyperplastic polyp. EGD on 07/08/2016 was normal.  No evidence of bleeding.  She has received 6 units of warmed PRBCs to date (last on 08/01/2016). If Rituxan is needed, she requires entecavir 0.5 mg q day beginning 2 weeks prior to Rituxan.  In addition, hepatitis B viral level and LFTs should be checked monthly.  She began steroids (1 mg/kg) on 08/01/2016.  She  stopped prednisone on 03/12/2017.  She is on folic acid 1 mg a day.  She  received B12 1000 mcg IM on 08/06/2016.  She is on oral B12.  B12 and folate were normal on 03/09/2017.  She developed peri-oral and intranasal herpes simplex-1.  She was treated with valacyclovir and doxycycline on 10/19/2016.  She developed a transient elevated alkaline phosphatase on 10/19/2016 which subsequently normalized.  She was documented to have a warm autoantibody on 10/12/2017.  LDH and bilirubin were normal.  Hematocrit had improved from prior testing.    She developed flu like symptoms on 10/26/2017.  Symptoms included cough, myalgias, and fever (tmax unknown).  She was prescribed Mucinex, Tamiflu, and amoxicillin.  She only took amoxicillin x 5 days.  She developed increased liver function tests on 11/02/2017. CMV IgG was positive.  EBV VCA IgG, NA IgG, early antigen antibody IgG were elevated.  EBV VCA IgM was < 36.  Testing was c/w a convalescence/past infection or reactivated infection.  LFTs normalized on 11/15/2017.  Smooth muscle antibody was 39 (high) on 11/10/2017 and 34 (high) on 11/30/2017.   Abdomen and pelvic CT on 11/10/2017 revealed a 2.2 cm spiculated density in right middle lobe concerning  for malignancy.   Chest CT on 11/16/2017 revealed a 2.2 x 2.0 x 2.4 spiculated RIGHT middle lobe mass.  There was tiny nonspecific upper lobe pulmonary nodules greater on RIGHT, largest 3 mm, of uncertain etiology.  There was additional 8 x 8 mm opacity in the posterior sulcus of the RIGHT lower lobe.  PET scan on 11/22/2017 revealed a 2 cm hypermetabolic right middle lobe lung mass (SUV 3.4) consistent  with primary lung neoplasm.  There were no findings for mediastinal/hilar lymphadenopathy or metastatic disease.  There were areas of hypermetabolism involving the left oblique abdominal muscles and the anorectal junction.  PFTs on 11/18/2017 revealed an FEV1 of 1.22 liters (51%).  DLCO adj was 6.8 mL/mmHg/min  (32%).  ENB done on 12/07/2017. Cytology was negative for malignancy. Pathology demonstrated organizing pneumonia and chronic bronchitis. Pathologist commented that organizing pneumonia spanned about 2 mm in one fragment. Cases of focal organizing pneumonia with hypermetabolism on FDG-PET have been described, but changes of this type can also be adjacent to a neoplasm. Patient prescribed a daily dose of Prednisone 10 mg, with re-imaging planned in 6-8 weeks.   She began prednisone 10 mg on 12/07/2017.  She has renal insufficiency.  Creatinine has been followed:  1.17 on 11/05/2017, 1.85 on 11/09/2017, 1.38 on 11/12/2017, 1.74 on 11/15/2017, 1.66 on 11/17/2017, and 1.48 on 11/23/2017.  Urinalysis on 11/15/2017 revealed revealed no hemoglobin, bilirubin or active sediment.  Symptomatically, she is doing "ok".  She has no acute symptoms. Hemoglobin has improved from 9.0 to 9.7 on steroids.  Reticulocyte count is elevated at 6.2%.   Plan: 1.  Labs today:  CBC with diff, LFTs, retic, haptoglobin, LDH. 2.  Discuss recent ENB. Cytology negative for malignancy. Pathology showed organizing pneumonia. Patient on steroids.  Discuss planned re-imaging in 6-8 weeks.  3.  Discuss LFTs and recent consult with GI Vicente Males). LFTs are now normal. Transaminitis felt likely be secondary to antibiotic course. Will follow up with GI in 8 weeks.  4.  Discuss AIHA.  Haptoglobin < 10, which is consistent with continued hemolysis. Patient on Prednisone 10 mg/day.  Will speak with Dr Mortimer Fries to see if increase in prednisone would be appropriate.  5.  RTC in 2 weeks for labs (CBC with diff, CMP) 6.  RTC in 6 weeks for MD assessment and labs (CBC with diff, CMP).   Honor Loh,  NP  12/13/2017, 12:06 PM   I saw and evaluated the patient, participating in the key portions of the service and reviewing pertinent diagnostic studies and records.  I reviewed the nurse practitioner's note and agree with the findings and the plan.  The  assessment and plan were discussed with the patient. Multiple questions were asked by the patient and answered.   Nolon Stalls, MD 12/13/2017,12:06 PM

## 2017-12-13 ENCOUNTER — Encounter: Payer: Self-pay | Admitting: Hematology and Oncology

## 2017-12-13 ENCOUNTER — Inpatient Hospital Stay (HOSPITAL_BASED_OUTPATIENT_CLINIC_OR_DEPARTMENT_OTHER): Payer: BLUE CROSS/BLUE SHIELD | Admitting: Hematology and Oncology

## 2017-12-13 ENCOUNTER — Encounter: Payer: Self-pay | Admitting: Cardiovascular Disease

## 2017-12-13 ENCOUNTER — Other Ambulatory Visit: Payer: Self-pay | Admitting: Hematology and Oncology

## 2017-12-13 ENCOUNTER — Ambulatory Visit (INDEPENDENT_AMBULATORY_CARE_PROVIDER_SITE_OTHER): Payer: BLUE CROSS/BLUE SHIELD | Admitting: Cardiovascular Disease

## 2017-12-13 ENCOUNTER — Inpatient Hospital Stay: Payer: BLUE CROSS/BLUE SHIELD

## 2017-12-13 ENCOUNTER — Other Ambulatory Visit: Payer: Self-pay

## 2017-12-13 VITALS — BP 128/60 | HR 85 | Ht 63.0 in | Wt 165.2 lb

## 2017-12-13 VITALS — BP 151/81 | HR 84 | Temp 95.2°F | Resp 20 | Wt 163.0 lb

## 2017-12-13 DIAGNOSIS — Z87891 Personal history of nicotine dependence: Secondary | ICD-10-CM | POA: Diagnosis not present

## 2017-12-13 DIAGNOSIS — R7989 Other specified abnormal findings of blood chemistry: Secondary | ICD-10-CM

## 2017-12-13 DIAGNOSIS — I700 Atherosclerosis of aorta: Secondary | ICD-10-CM | POA: Diagnosis not present

## 2017-12-13 DIAGNOSIS — D591 Autoimmune hemolytic anemia, unspecified: Secondary | ICD-10-CM

## 2017-12-13 DIAGNOSIS — R918 Other nonspecific abnormal finding of lung field: Secondary | ICD-10-CM

## 2017-12-13 DIAGNOSIS — J449 Chronic obstructive pulmonary disease, unspecified: Secondary | ICD-10-CM

## 2017-12-13 DIAGNOSIS — R945 Abnormal results of liver function studies: Secondary | ICD-10-CM

## 2017-12-13 DIAGNOSIS — B1910 Unspecified viral hepatitis B without hepatic coma: Secondary | ICD-10-CM | POA: Diagnosis not present

## 2017-12-13 DIAGNOSIS — B1920 Unspecified viral hepatitis C without hepatic coma: Secondary | ICD-10-CM

## 2017-12-13 DIAGNOSIS — R0602 Shortness of breath: Secondary | ICD-10-CM | POA: Diagnosis not present

## 2017-12-13 DIAGNOSIS — E039 Hypothyroidism, unspecified: Secondary | ICD-10-CM | POA: Diagnosis not present

## 2017-12-13 DIAGNOSIS — E782 Mixed hyperlipidemia: Secondary | ICD-10-CM | POA: Diagnosis not present

## 2017-12-13 DIAGNOSIS — Z01810 Encounter for preprocedural cardiovascular examination: Secondary | ICD-10-CM

## 2017-12-13 DIAGNOSIS — L930 Discoid lupus erythematosus: Secondary | ICD-10-CM | POA: Diagnosis not present

## 2017-12-13 DIAGNOSIS — N289 Disorder of kidney and ureter, unspecified: Secondary | ICD-10-CM | POA: Diagnosis not present

## 2017-12-13 DIAGNOSIS — I739 Peripheral vascular disease, unspecified: Secondary | ICD-10-CM | POA: Diagnosis not present

## 2017-12-13 DIAGNOSIS — Z7189 Other specified counseling: Secondary | ICD-10-CM

## 2017-12-13 LAB — CBC WITH DIFFERENTIAL/PLATELET
Basophils Absolute: 0.1 10*3/uL (ref 0–0.1)
Basophils Relative: 1 %
Eosinophils Absolute: 0.1 10*3/uL (ref 0–0.7)
Eosinophils Relative: 2 %
HCT: 29.2 % — ABNORMAL LOW (ref 35.0–47.0)
Hemoglobin: 9.7 g/dL — ABNORMAL LOW (ref 12.0–16.0)
Lymphocytes Relative: 13 %
Lymphs Abs: 0.7 10*3/uL — ABNORMAL LOW (ref 1.0–3.6)
MCH: 32.3 pg (ref 26.0–34.0)
MCHC: 33.2 g/dL (ref 32.0–36.0)
MCV: 97.5 fL (ref 80.0–100.0)
Monocytes Absolute: 0.3 10*3/uL (ref 0.2–0.9)
Monocytes Relative: 5 %
Neutro Abs: 4.3 10*3/uL (ref 1.4–6.5)
Neutrophils Relative %: 79 %
Platelets: 174 10*3/uL (ref 150–440)
RBC: 2.99 MIL/uL — ABNORMAL LOW (ref 3.80–5.20)
RDW: 17 % — ABNORMAL HIGH (ref 11.5–14.5)
WBC: 5.4 10*3/uL (ref 3.6–11.0)

## 2017-12-13 LAB — HEPATIC FUNCTION PANEL
ALT: 10 U/L — ABNORMAL LOW (ref 14–54)
AST: 16 U/L (ref 15–41)
Albumin: 4.1 g/dL (ref 3.5–5.0)
Alkaline Phosphatase: 84 U/L (ref 38–126)
Bilirubin, Direct: 0.1 mg/dL (ref 0.1–0.5)
Indirect Bilirubin: 0.6 mg/dL (ref 0.3–0.9)
Total Bilirubin: 0.7 mg/dL (ref 0.3–1.2)
Total Protein: 7.4 g/dL (ref 6.5–8.1)

## 2017-12-13 LAB — RETICULOCYTES
RBC.: 3.02 MIL/uL — ABNORMAL LOW (ref 3.80–5.20)
Retic Count, Absolute: 187.2 10*3/uL — ABNORMAL HIGH (ref 19.0–183.0)
Retic Ct Pct: 6.2 % — ABNORMAL HIGH (ref 0.4–3.1)

## 2017-12-13 LAB — LACTATE DEHYDROGENASE: LDH: 165 U/L (ref 98–192)

## 2017-12-13 NOTE — Progress Notes (Signed)
Here for follow up. Stated doing"ok "overall.

## 2017-12-13 NOTE — Patient Instructions (Addendum)
Medication Instructions:   No medication changes made  Labwork:  No new labs needed  Testing/Procedures:  We will schedule a lexiscan myoview, CAD  [place] caregiver has ordered a Stress Test with nuclear imaging. The purpose of this test is to evaluate the blood supply to your heart muscle. This procedure is referred to as a "Non-Invasive Stress Test." This is because other than having an IV started in your vein, nothing is inserted or "invades" your body. Cardiac stress tests are done to find areas of poor blood flow to the heart by determining the extent of coronary artery disease (CAD). Some patients exercise on a treadmill, which naturally increases the blood flow to your heart, while others who are  unable to walk on a treadmill due to physical limitations have a pharmacologic/chemical stress agent called Lexiscan . This medicine will mimic walking on a treadmill by temporarily increasing your coronary blood flow.   Please note: these test may take anywhere between 2-4 hours to complete  PLEASE REPORT TO [facility] AT THE FIRST DESK WILL DIRECT YOU WHERE TO GO  Date of Procedure:_____________________________________  Arrival Time for Procedure:______________________________  Instructions regarding medication:   __x__ : you may take your regular medications the morning of your test with enough water to get them down safely  PLEASE NOTIFY THE OFFICE AT LEAST 24 HOURS IN ADVANCE IF YOU ARE UNABLE TO [employer].  [phone] AND  PLEASE NOTIFY NUCLEAR MEDICINE AT [facility] AT LEAST 24 HOURS IN ADVANCE IF YOU ARE UNABLE TO KEEP YOUR APPOINTMENT. [phone]  How to prepare for your Myoview test:  1. Do not eat or drink after midnight 2. No caffeine for 24 hours prior to test 3. No smoking 24 hours prior to test. 4. Your medication may be taken with water.  If your doctor stopped a medication because of this test, do not take  that medication. 5. Ladies, please do not wear dresses.  Skirts or pants are appropriate. Please wear a short sleeve shirt. 6. No perfume, cologne or lotion. 7. Wear comfortable walking shoes. No heels!  Follow-Up: It was a pleasure seeing you in the office today. Please call us if you have new issues that need to be addressed before your next appt.  [phone]  Your physician wants you to follow-up in: 12 months.  You will receive a reminder letter in the mail two months in advance. If you don't receive a letter, please call our office to schedule the follow-up appointment.  If you need a refill on your cardiac medications before your next appointment, please call your pharmacy.  For educational health videos Log in to : [email or web address] Or : [email or web address], password : [email or web address]   Cardiac Nuclear Scan A cardiac nuclear scan is a test that measures blood flow to the heart when a person is resting and when he or she is exercising. The test looks for problems such as:  Not enough blood reaching a portion of the heart.  The heart muscle not working normally.  You may need this test if:  You have heart disease.  You have had abnormal lab results.  You have had heart surgery or angioplasty.  You have chest pain.  You have shortness of breath.  In this test, a radioactive dye (tracer) is injected into your bloodstream. After the tracer has traveled to your heart, an imaging device is used to measure how much of the tracer is absorbed by or  distributed to various areas of your heart. This procedure is usually done at a hospital and takes 2-4 hours. Tell a health care provider about:  Any allergies you have.  All medicines you are taking, including vitamins, herbs, eye drops, creams, and over-the-counter medicines.  Any problems you or family members have had with the use of anesthetic medicines.  Any blood disorders you have.  Any surgeries you have had.  Any medical  conditions you have.  Whether you are pregnant or may be pregnant. What are the risks? Generally, this is a safe procedure. However, problems may occur, including:  Serious chest pain and heart attack. This is only a risk if the stress portion of the test is done.  Rapid heartbeat.  Sensation of warmth in your chest. This usually passes quickly.  What happens before the procedure?  Ask your health care provider about changing or stopping your regular medicines. This is especially important if you are taking diabetes medicines or blood thinners.  Remove your jewelry on the day of the procedure. What happens during the procedure?  An IV tube will be inserted into one of your veins.  Your health care provider will inject a small amount of radioactive tracer through the tube.  You will wait for 20-40 minutes while the tracer travels through your bloodstream.  Your heart activity will be monitored with an electrocardiogram (ECG).  You will lie down on an exam table.  Images of your heart will be taken for about 15-20 minutes.  You may be asked to exercise on a treadmill or stationary bike. While you exercise, your heart's activity will be monitored with an ECG, and your blood pressure will be checked. If you are unable to exercise, you may be given a medicine to increase blood flow to parts of your heart.  When blood flow to your heart has peaked, a tracer will again be injected through the IV tube.  After 20-40 minutes, you will get back on the exam table and have more images taken of your heart.  When the procedure is over, your IV tube will be removed. The procedure may vary among health care providers and hospitals. Depending on the type of tracer used, scans may need to be repeated 3-4 hours later. What happens after the procedure?  Unless your health care provider tells you otherwise, you may return to your normal schedule, including diet, activities, and  medicines.  Unless your health care provider tells you otherwise, you may increase your fluid intake. This will help flush the contrast dye from your body. Drink enough fluid to keep your urine clear or pale yellow.  It is up to you to get your test results. Ask your health care provider, or the department that is doing the test, when your results will be ready. Summary  A cardiac nuclear scan measures the blood flow to the heart when a person is resting and when he or she is exercising.  You may need this test if you are at risk for heart disease.  Tell your health care provider if you are pregnant.  Unless your health care provider tells you otherwise, increase your fluid intake. This will help flush the contrast dye from your body. Drink enough fluid to keep your urine clear or pale yellow. This information is not intended to replace advice given to you by your health care provider. Make sure you discuss any questions you have with your health care provider. Document Released: 09/11/2004 Document Revised: 08/19/2016  Document Reviewed: 07/26/2013 Elsevier Interactive Patient Education  2017 [employer].

## 2017-12-14 LAB — HAPTOGLOBIN: Haptoglobin: 13 mg/dL — ABNORMAL LOW (ref 34–200)

## 2017-12-20 ENCOUNTER — Ambulatory Visit (INDEPENDENT_AMBULATORY_CARE_PROVIDER_SITE_OTHER): Payer: BLUE CROSS/BLUE SHIELD | Admitting: Cardiothoracic Surgery

## 2017-12-20 ENCOUNTER — Encounter: Payer: Self-pay | Admitting: Cardiothoracic Surgery

## 2017-12-20 VITALS — BP 157/74 | HR 88 | Temp 97.3°F | Ht 63.5 in | Wt 165.0 lb

## 2017-12-20 DIAGNOSIS — R918 Other nonspecific abnormal finding of lung field: Secondary | ICD-10-CM

## 2017-12-20 NOTE — Patient Instructions (Signed)
Please give us a call in case you have any questions or concerns.  

## 2017-12-20 NOTE — Progress Notes (Signed)
  Patient ID: Tina Page, female   DOB: 04/14/1956, 62 y.o.   MRN: [id number]  HISTORY: Tina Page returns today in follow-up.  She did undergo bronchoscopy which did not reveal any evidence of malignancy.  She was started on 10 mg a day of prednisone by Dr. Christel Mormon.  However Dr. Mike Gip would like to increase that to 30 mg a day.  She will contact Dr. Mike Gip to determine what the dosage is to take.  Otherwise she is getting along pretty well.  She does have other follow-up scheduled for cardiology and gastroenterology for follow-up of her liver disease.   Vitals:   12/20/17 0809  BP: (!) 157/74  Pulse: 88  Temp: (!) 97.3 F (36.3 C)     EXAM:    Resp: Lungs are clear bilaterally.  No respiratory distress, normal effort. Heart:  Regular without murmurs Abd:  Abdomen is soft, non distended and non tender. No masses are palpable.  There is no rebound and no guarding.  Neurological: Alert and oriented to person, place, and time. Coordination normal.  Skin: Skin is warm and dry. No rash noted. No diaphoretic. No erythema. No pallor.  Psychiatric: Normal mood and affect. Normal behavior. Judgment and thought content normal.    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  Since there is no evidence of malignancy we certainly would not recommend any surgical intervention.  The patient is aware of this and will follow up with Dr. Mortimer Fries in 6 weeks with a repeat CT scan.    Nestor Lewandowsky, MD

## 2017-12-23 ENCOUNTER — Encounter
Admission: RE | Admit: 2017-12-23 | Discharge: 2017-12-23 | Disposition: A | Discharge status: Home / Self Care | Payer: BLUE CROSS/BLUE SHIELD | Source: Ambulatory Visit | Attending: Cardiovascular Disease | Admitting: Cardiovascular Disease

## 2017-12-23 DIAGNOSIS — Z01810 Encounter for preprocedural cardiovascular examination: Secondary | ICD-10-CM | POA: Diagnosis not present

## 2017-12-23 DIAGNOSIS — R0602 Shortness of breath: Secondary | ICD-10-CM | POA: Diagnosis not present

## 2017-12-23 LAB — NM MYOCAR MULTI W/SPECT W/WALL MOTION / EF
CHL CUP NUCLEAR SSS: 5
CSEPPHR: 105 {beats}/min
LV sys vol: 28 mL
LVDIAVOL: 50 mL (ref 46–106)
NUC STRESS TID: 1
Percent HR: 65 %
Rest HR: 77 {beats}/min
SDS: 0
SRS: 0

## 2017-12-23 MED ORDER — TECHNETIUM TC 99M TETROFOSMIN IV KIT
32.4500 | PACK | Freq: Once | INTRAVENOUS | Status: AC | PRN
  Administered 2017-12-23: 32.45 via INTRAVENOUS

## 2017-12-23 MED ORDER — REGADENOSON 0.4 MG/5ML IV SOLN
0.4000 mg | Freq: Once | INTRAVENOUS | Status: AC
  Administered 2017-12-23: 0.4 mg via INTRAVENOUS
  Filled 2017-12-23: qty 5

## 2017-12-23 MED ORDER — TECHNETIUM TC 99M TETROFOSMIN IV KIT
13.4080 | PACK | Freq: Once | INTRAVENOUS | Status: AC | PRN
  Administered 2017-12-23: 13.408 via INTRAVENOUS

## 2017-12-24 ENCOUNTER — Telehealth: Payer: Self-pay | Admitting: Cardiovascular Disease

## 2017-12-24 NOTE — Telephone Encounter (Signed)
Patient returning call for stress test results Please call

## 2017-12-24 NOTE — Telephone Encounter (Signed)
Results reviewed with patient and she verbalized understanding

## 2017-12-27 ENCOUNTER — Inpatient Hospital Stay: Payer: BLUE CROSS/BLUE SHIELD

## 2017-12-27 DIAGNOSIS — R918 Other nonspecific abnormal finding of lung field: Secondary | ICD-10-CM

## 2017-12-27 DIAGNOSIS — L930 Discoid lupus erythematosus: Secondary | ICD-10-CM | POA: Diagnosis not present

## 2017-12-27 LAB — CBC WITH DIFFERENTIAL/PLATELET
Basophils Absolute: 0.1 10*3/uL (ref 0–0.1)
Basophils Relative: 1 %
Eosinophils Absolute: 0.1 10*3/uL (ref 0–0.7)
Eosinophils Relative: 2 %
HCT: 31 % — ABNORMAL LOW (ref 35.0–47.0)
Hemoglobin: 10.5 g/dL — ABNORMAL LOW (ref 12.0–16.0)
Lymphocytes Relative: 10 %
Lymphs Abs: 0.6 10*3/uL — ABNORMAL LOW (ref 1.0–3.6)
MCH: 32.7 pg (ref 26.0–34.0)
MCHC: 33.9 g/dL (ref 32.0–36.0)
MCV: 96.4 fL (ref 80.0–100.0)
Monocytes Absolute: 0.4 10*3/uL (ref 0.2–0.9)
Monocytes Relative: 6 %
Neutro Abs: 5.1 10*3/uL (ref 1.4–6.5)
Neutrophils Relative %: 81 %
Platelets: 147 10*3/uL — ABNORMAL LOW (ref 150–440)
RBC: 3.21 MIL/uL — ABNORMAL LOW (ref 3.80–5.20)
RDW: 16 % — ABNORMAL HIGH (ref 11.5–14.5)
WBC: 6.3 10*3/uL (ref 3.6–11.0)

## 2017-12-27 LAB — COMPREHENSIVE METABOLIC PANEL
ALT: 12 U/L — ABNORMAL LOW (ref 14–54)
AST: 16 U/L (ref 15–41)
Albumin: 4.2 g/dL (ref 3.5–5.0)
Alkaline Phosphatase: 80 U/L (ref 38–126)
Anion gap: 9 (ref 5–15)
BUN: 32 mg/dL — ABNORMAL HIGH (ref 6–20)
CO2: 24 mmol/L (ref 22–32)
Calcium: 9.2 mg/dL (ref 8.9–10.3)
Chloride: 102 mmol/L (ref 101–111)
Creatinine, Ser: 1.2 mg/dL — ABNORMAL HIGH (ref 0.44–1.00)
GFR calc Af Amer: 56 mL/min — ABNORMAL LOW (ref >60)
GFR calc non Af Amer: 48 mL/min — ABNORMAL LOW (ref >60)
Glucose, Bld: 102 mg/dL — ABNORMAL HIGH (ref 65–99)
Potassium: 4.5 mmol/L (ref 3.5–5.1)
Sodium: 135 mmol/L (ref 135–145)
Total Bilirubin: 0.8 mg/dL (ref 0.3–1.2)
Total Protein: 7.2 g/dL (ref 6.5–8.1)

## 2018-01-02 ENCOUNTER — Encounter: Payer: Self-pay | Admitting: Gastroenterology

## 2018-01-11 ENCOUNTER — Other Ambulatory Visit: Payer: Self-pay

## 2018-01-11 ENCOUNTER — Ambulatory Visit (INDEPENDENT_AMBULATORY_CARE_PROVIDER_SITE_OTHER): Payer: BLUE CROSS/BLUE SHIELD | Admitting: Gastroenterology

## 2018-01-11 ENCOUNTER — Encounter: Payer: Self-pay | Admitting: Gastroenterology

## 2018-01-11 VITALS — BP 120/74 | HR 90 | Ht 63.0 in | Wt 167.4 lb

## 2018-01-11 DIAGNOSIS — R945 Abnormal results of liver function studies: Secondary | ICD-10-CM | POA: Diagnosis not present

## 2018-01-11 DIAGNOSIS — R7989 Other specified abnormal findings of blood chemistry: Secondary | ICD-10-CM

## 2018-01-11 NOTE — Progress Notes (Signed)
Jonathon Bellows MD, MRCP(U.K) [address]  [place]  [place], [place] [address]  Main: [phone]  Fax: [phone]   Primary Care Physician: Frazier Richards, MD  Primary Gastroenterologist:  Dr. Jonathon Bellows   Chief Complaint  Patient presents with  . follow up abnormal liver function    HPI: Tina Page is a 62 y.o. female   Summary of history : She is here today to follow up to her last and initial visit for elevated LFT's on 11/30/17 .  . LFt's were acutely elevated for 3 weeks and rapidly returned to normal. 11/03/17 - HCV not detected. Hep B c ab +ve, AMA negative. Elevated Ferritin . Hbsag-negative.CT abdomen 10/2017 shows no liver abnormalities.   Interval history   4//2019-  01/11/2018  Labs 11/2017- F actin positive . Elevated ferritin 667  Rest iron studies ,ceruloplasmin,celiac serology, LKM ab   are normal.   Hep A/B/C serology negative.    Hepatic Function Latest Ref Rng & Units 12/27/2017 12/13/2017 12/09/2017  Total Protein 6.5 - 8.1 g/dL 7.2 7.4 7.1  Albumin 3.5 - 5.0 g/dL 4.2 4.1 4.0  AST 15 - 41 U/L 16 16 14(L)  ALT 14 - 54 U/L 12(L) 10(L) 10(L)  Alk Phosphatase 38 - 126 U/L 80 84 79  Total Bilirubin 0.3 - 1.2 mg/dL 0.8 0.7 0.9  Bilirubin, Direct 0.1 - 0.5 mg/dL - 0.1 0.2     Current Outpatient Medications  Medication Sig Dispense Refill  . albuterol (PROVENTIL HFA;VENTOLIN HFA) 108 (90 Base) MCG/ACT inhaler Inhale 2 puffs into the lungs every 6 (six) hours as needed for wheezing or shortness of breath.     . allopurinol (ZYLOPRIM) 100 MG tablet TAKE 1 TABLET BY MOUTH EVERY DAY 90 tablet 1  . clopidogrel (PLAVIX) 75 MG tablet Take 1 tablet (75 mg total) by mouth daily. 30 tablet 5  . cyanocobalamin 500 MCG tablet Take 500 mcg by mouth daily.    [name] Kitchen docusate sodium (COLACE) 100 MG capsule Take 1 capsule (100 mg total) by mouth 2 (two) times daily. (Patient taking differently: Take 100 mg by mouth daily. ) 10 capsule 0  . folic acid (FOLVITE) 1 MG  tablet TAKE 1 TABLET (1 MG TOTAL) BY MOUTH DAILY. 90 tablet 1  . hydrochlorothiazide (HYDRODIURIL) 25 MG tablet Take 25 mg by mouth daily.    [name] Kitchen levothyroxine (SYNTHROID, LEVOTHROID) 75 MCG tablet     . lisinopril (PRINIVIL,ZESTRIL) 20 MG tablet Take 20 mg by mouth daily.     [name] Kitchen omeprazole (PRILOSEC) 20 MG capsule Take 1 capsule (20 mg total) by mouth daily. 60 capsule 0  . potassium chloride (KLOR-CON) 20 MEQ packet Take 20 mEq by mouth daily.    . pravastatin (PRAVACHOL) 40 MG tablet Take 40 mg by mouth every evening.    . predniSONE (DELTASONE) 20 MG tablet Take 0.5 tablets (10 mg total) by mouth daily. 30 tablet 1   No current facility-administered medications for this visit.     Allergies as of 01/11/2018 - Review Complete 01/11/2018  Allergen Reaction Noted  . Codeine Anaphylaxis 07/07/2016    ROS:  General: Negative for anorexia, weight loss, fever, chills, fatigue, weakness. ENT: Negative for hoarseness, difficulty swallowing , nasal congestion. CV: Negative for chest pain, angina, palpitations, dyspnea on exertion, peripheral edema.  Respiratory: Negative for dyspnea at rest, dyspnea on exertion, cough, sputum, wheezing.  GI: See history of present illness. GU:  Negative for dysuria, hematuria, urinary incontinence, urinary frequency, nocturnal  urination.  Endo: Negative for unusual weight change.    Physical Examination:   BP 120/74   Pulse 90   Ht 5' 3"  (1.6 m)   Wt 167 lb 6.4 oz (75.9 kg)   BMI 29.65 kg/m   General: Well-nourished, well-developed in no acute distress.  Eyes: No icterus. Conjunctivae pink. Mouth: Oropharyngeal mucosa moist and pink , no lesions erythema or exudate. Lungs: Clear to auscultation bilaterally. Non-labored. Heart: Regular rate and rhythm, no murmurs rubs or gallops.  Abdomen: Bowel sounds are normal, nontender, nondistended, no hepatosplenomegaly or masses, no abdominal bruits or hernia , no rebound or guarding.   Extremities: No lower  extremity edema. No clubbing or deformities. Neuro: Alert and oriented x 3.  Grossly intact. Skin: Warm and dry, no jaundice.   Psych: Alert and cooperative, normal mood and affect.   Imaging Studies: Nm Myocar Multi W/spect W/wall Motion / Ef  Result Date: 12/23/2017 Pharmacological myocardial perfusion imaging study with no significant  Ischemia GI uptake artifact noted Normal wall motion, EF estimated at 77% No EKG changes concerning for ischemia at peak stress or in recovery. Low risk scan Signed, Esmond Plants, MD, Ph.D [facility]    Assessment and Plan:   Tina Page is a 61 y.o. 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 Presently the LFT's are normal , they were acutely raised for a short period of time and going back historically the LFT's have been normal.   With a positive antibody but normal LFt's wouldn't need any further liver work up. If the LFT's were to rise and stay up for a awhile then the next step would be a liver biopsy to determine if the liver has an underlying autoimmune hepatitis and decision about treatment would be based on the biopsy. Suggest monitoring LFT's Q4-6 monthly and refer back if they rise.     Dr Jonathon Bellows  MD,MRCP [facility]) Follow up PRN

## 2018-01-17 ENCOUNTER — Ambulatory Visit: Payer: BLUE CROSS/BLUE SHIELD | Admitting: Hematology and Oncology

## 2018-01-17 ENCOUNTER — Other Ambulatory Visit: Payer: BLUE CROSS/BLUE SHIELD

## 2018-01-21 ENCOUNTER — Other Ambulatory Visit: Payer: BLUE CROSS/BLUE SHIELD

## 2018-01-21 ENCOUNTER — Ambulatory Visit: Payer: BLUE CROSS/BLUE SHIELD | Admitting: Hematology and Oncology

## 2018-01-31 ENCOUNTER — Encounter

## 2018-01-31 ENCOUNTER — Ambulatory Visit: Payer: BLUE CROSS/BLUE SHIELD | Admitting: Hematology and Oncology

## 2018-01-31 ENCOUNTER — Other Ambulatory Visit: Payer: BLUE CROSS/BLUE SHIELD

## 2018-02-01 ENCOUNTER — Inpatient Hospital Stay: Payer: BLUE CROSS/BLUE SHIELD | Attending: Hematology and Oncology

## 2018-02-01 ENCOUNTER — Encounter: Payer: Self-pay | Admitting: Hematology and Oncology

## 2018-02-01 ENCOUNTER — Other Ambulatory Visit: Payer: Self-pay | Admitting: Hematology and Oncology

## 2018-02-01 ENCOUNTER — Inpatient Hospital Stay (HOSPITAL_BASED_OUTPATIENT_CLINIC_OR_DEPARTMENT_OTHER): Payer: BLUE CROSS/BLUE SHIELD | Admitting: Hematology and Oncology

## 2018-02-01 VITALS — BP 118/69 | HR 83 | Temp 95.2°F | Resp 18 | Wt 169.0 lb

## 2018-02-01 DIAGNOSIS — J449 Chronic obstructive pulmonary disease, unspecified: Secondary | ICD-10-CM | POA: Diagnosis not present

## 2018-02-01 DIAGNOSIS — I10 Essential (primary) hypertension: Secondary | ICD-10-CM | POA: Diagnosis not present

## 2018-02-01 DIAGNOSIS — D591 Autoimmune hemolytic anemia, unspecified: Secondary | ICD-10-CM

## 2018-02-01 DIAGNOSIS — E039 Hypothyroidism, unspecified: Secondary | ICD-10-CM

## 2018-02-01 DIAGNOSIS — R918 Other nonspecific abnormal finding of lung field: Secondary | ICD-10-CM

## 2018-02-01 DIAGNOSIS — N289 Disorder of kidney and ureter, unspecified: Secondary | ICD-10-CM | POA: Insufficient documentation

## 2018-02-01 DIAGNOSIS — R5382 Chronic fatigue, unspecified: Secondary | ICD-10-CM | POA: Diagnosis not present

## 2018-02-01 DIAGNOSIS — A09 Infectious gastroenteritis and colitis, unspecified: Secondary | ICD-10-CM | POA: Insufficient documentation

## 2018-02-01 DIAGNOSIS — Z87891 Personal history of nicotine dependence: Secondary | ICD-10-CM | POA: Diagnosis not present

## 2018-02-01 LAB — COMPREHENSIVE METABOLIC PANEL
ALT: 11 U/L — ABNORMAL LOW (ref 14–54)
AST: 16 U/L (ref 15–41)
Albumin: 4.2 g/dL (ref 3.5–5.0)
Alkaline Phosphatase: 68 U/L (ref 38–126)
Anion gap: 10 (ref 5–15)
BUN: 34 mg/dL — ABNORMAL HIGH (ref 6–20)
CO2: 24 mmol/L (ref 22–32)
Calcium: 9 mg/dL (ref 8.9–10.3)
Chloride: 105 mmol/L (ref 101–111)
Creatinine, Ser: 1.23 mg/dL — ABNORMAL HIGH (ref 0.44–1.00)
GFR calc Af Amer: 54 mL/min — ABNORMAL LOW (ref >60)
GFR calc non Af Amer: 47 mL/min — ABNORMAL LOW (ref >60)
Glucose, Bld: 119 mg/dL — ABNORMAL HIGH (ref 65–99)
Potassium: 4.2 mmol/L (ref 3.5–5.1)
Sodium: 139 mmol/L (ref 135–145)
Total Bilirubin: 0.8 mg/dL (ref 0.3–1.2)
Total Protein: 7 g/dL (ref 6.5–8.1)

## 2018-02-01 LAB — CBC WITH DIFFERENTIAL/PLATELET
Basophils Absolute: 0.1 10*3/uL (ref 0–0.1)
Basophils Relative: 1 %
Eosinophils Absolute: 0.1 10*3/uL (ref 0–0.7)
Eosinophils Relative: 1 %
HCT: 28 % — ABNORMAL LOW (ref 35.0–47.0)
Hemoglobin: 9.5 g/dL — ABNORMAL LOW (ref 12.0–16.0)
Lymphocytes Relative: 12 %
Lymphs Abs: 0.7 10*3/uL — ABNORMAL LOW (ref 1.0–3.6)
MCH: 33.2 pg (ref 26.0–34.0)
MCHC: 33.7 g/dL (ref 32.0–36.0)
MCV: 98.3 fL (ref 80.0–100.0)
Monocytes Absolute: 0.2 10*3/uL (ref 0.2–0.9)
Monocytes Relative: 4 %
Neutro Abs: 4.6 10*3/uL (ref 1.4–6.5)
Neutrophils Relative %: 82 %
Platelets: 154 10*3/uL (ref 150–440)
RBC: 2.85 MIL/uL — ABNORMAL LOW (ref 3.80–5.20)
RDW: 15.4 % — ABNORMAL HIGH (ref 11.5–14.5)
WBC: 5.6 10*3/uL (ref 3.6–11.0)

## 2018-02-01 LAB — RETICULOCYTES
RBC.: 2.93 MIL/uL — ABNORMAL LOW (ref 3.80–5.20)
Retic Count, Absolute: 167 10*3/uL (ref 19.0–183.0)
Retic Ct Pct: 5.7 % — ABNORMAL HIGH (ref 0.4–3.1)

## 2018-02-01 LAB — DAT, POLYSPECIFIC AHG (ARMC ONLY)
DAT, IgG: POSITIVE
DAT, complement: POSITIVE
Polyspecific AHG test: POSITIVE

## 2018-02-01 LAB — LACTATE DEHYDROGENASE: LDH: 198 U/L — ABNORMAL HIGH (ref 98–192)

## 2018-02-01 NOTE — Progress Notes (Signed)
[facility] day:  02/01/2018  Chief Complaint: Tina Page is a 62 y.o. female with a a right middle lobe mass, warm autoimmune hemolytic anemia, and renal insufficiency who is seen for 2 month assessment.  HPI:  The patient was last seen in the hematology clinic on 12/13/2017.  At that time, Tina Page was doing "ok".  Tina Page had no acute symptoms. Hemoglobin had improved from 9.0 to 9.7 on steroids.  Reticulocyte count was elevated at 6.2%.  Bilirubin was 0.7.  LDH 165.  Haptoglobin was 13 (improved).  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  Reimaging was planned in 6-8 weeks.  CBC on 12/27/2017 revealed a hematocrit of 31.0, hemoglobin 10.5, MCV 96.4, platelets 147,000, white count 6300 with an ANC of 5100.  Biliruben was 0.8.  Creatinine was 1.2.  Tina Page saw Dr. Vicente Males on 01/11/2018.  LFTs were normal.  Suggestion was monitoring LFTs ever 4-6 months.  During the interim, Tina Page has felt "ok".  Tina Page has remained on prednisone 10 mg/day as prescribed by Dr. Mortimer Fries.  Tina Page does not want to be on steroids.  Weight is up 6 pounds.  Tina Page still has a cough.  Tina Page denies any shortness of breath.  Tina Page is taking H37 and folic acid.  Tina Page sees Dr. Holley Raring in 05/2018.   Past Medical History:  Diagnosis Date  . Anemia   . Atherosclerosis of native arteries of extremity with intermittent claudication ([place]) 07/20/2016  . COPD (chronic obstructive pulmonary disease) ([place])   . 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 Elevated liver function tests 11/03/2017  . GERD (gastroesophageal reflux disease)   . Heme positive stool 01/29/2015  . HLD (hyperlipidemia)   . Hypertension   . Hypothyroidism   . 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 Thrombocytopenia ([place]) 10/04/2016    Past Surgical History:  Procedure Laterality Date  . ELECTROMAGNETIC NAVIGATION BROCHOSCOPY N/A 12/07/2017   Procedure: ELECTROMAGNETIC NAVIGATION BRONCHOSCOPY;  Surgeon: Flora Lipps, MD;  Location: [facility] ORS;  Service: Cardiopulmonary;  Laterality: N/A;  . ESOPHAGOGASTRODUODENOSCOPY (EGD) WITH PROPOFOL N/A 07/08/2016   Procedure: ESOPHAGOGASTRODUODENOSCOPY (EGD) WITH PROPOFOL;  Surgeon: Manya Silvas, MD;  Location: [facility] ENDOSCOPY;  Service: Endoscopy;  Laterality: N/A;  . KNEE SURGERY Right    repair of acl tear  . PERIPHERAL VASCULAR CATHETERIZATION N/A 07/10/2016   Procedure: Lower Extremity Angiography;  Surgeon: Katha Cabal, MD;  Location: [place] [facility] LAB;  Service: Cardiovascular;  Laterality: N/A;  . PERIPHERAL VASCULAR CATHETERIZATION N/A 07/10/2016   Procedure: Abdominal Aortogram w/Lower Extremity;  Surgeon: Katha Cabal, MD;  Location: [place] [facility];  Service: Cardiovascular;  Laterality: N/A;  . PERIPHERAL VASCULAR CATHETERIZATION  07/10/2016   Procedure: Lower Extremity Intervention;  Surgeon: Katha Cabal, MD;  Location: [place] [facility];  Service: Cardiovascular;;    Family History  Problem Relation Age of Onset  . Diabetes Mother   . Hypertension Mother   . Diabetes Maternal Grandfather   . Hypertension Maternal Grandfather     Social History:  reports that Tina Page quit smoking about 19 months ago. Her smoking use included cigarettes. Tina Page smoked 0.50 packs per day. Tina Page has never used smokeless tobacco. Tina Page reports that Tina Page does not drink alcohol or use drugs.  Tina Page has a 21 pack year smoking history (1/2 pack/day from age [age]).  The patient works at [employer]  Tina Page is exposed to cold temperatures.  Tina Page  works 3rd shift.  Tina Page has a daughter, [name] and a daughter named [name].  Tina Page is alone today.   Allergies:  Allergies  Allergen Reactions  . Codeine Anaphylaxis    Current Medications: Current Outpatient Medications  Medication Sig Dispense Refill  . albuterol (PROVENTIL HFA;VENTOLIN HFA) 108 (90 Base) MCG/ACT inhaler Inhale 2 puffs into the lungs every 6 (six) hours as needed for wheezing or  shortness of breath.     . allopurinol (ZYLOPRIM) 100 MG tablet TAKE 1 TABLET BY MOUTH EVERY DAY 90 tablet 1  . clopidogrel (PLAVIX) 75 MG tablet Take 1 tablet (75 mg total) by mouth daily. 30 tablet 5  . cyanocobalamin 500 MCG tablet Take 500 mcg by mouth daily.    [name] Kitchen docusate sodium (COLACE) 100 MG capsule Take 1 capsule (100 mg total) by mouth 2 (two) times daily. (Patient taking differently: Take 100 mg by mouth daily. ) 10 capsule 0  . folic acid (FOLVITE) 1 MG tablet TAKE 1 TABLET (1 MG TOTAL) BY MOUTH DAILY. 90 tablet 1  . hydrochlorothiazide (HYDRODIURIL) 25 MG tablet Take 25 mg by mouth daily.    [name] Kitchen levothyroxine (SYNTHROID, LEVOTHROID) 75 MCG tablet     . lisinopril (PRINIVIL,ZESTRIL) 20 MG tablet Take 20 mg by mouth daily.     [name] Kitchen omeprazole (PRILOSEC) 20 MG capsule Take 1 capsule (20 mg total) by mouth daily. 60 capsule 0  . potassium chloride (KLOR-CON) 20 MEQ packet Take 20 mEq by mouth daily.    . pravastatin (PRAVACHOL) 40 MG tablet Take 40 mg by mouth every evening.    . predniSONE (DELTASONE) 20 MG tablet Take 0.5 tablets (10 mg total) by mouth daily. 30 tablet 1   No current facility-administered medications for this visit.     Review of Systems:  GENERAL:  Feels "ok".  No fevers, sweats or weight loss.  Weight up 6 pounds. PERFORMANCE STATUS (ECOG):  1 HEENT:  No visual changes, runny nose, sore throat, mouth sores or tenderness. Lungs: No shortness of breath.  Cough.  No hemoptysis. Cardiac:  No chest pain, palpitations, orthopnea, or PND. GI:  No nausea, vomiting, diarrhea, constipation, melena or hematochezia. GU:  No urgency, frequency, dysuria, or hematuria. Musculoskeletal:  No back pain.  No joint pain.  No muscle tenderness. Extremities:  No pain or swelling. Skin:  Discoid lupus.  No rashes or skin changes. Neuro:  No headache, numbness or weakness, balance or coordination issues. Endocrine:  No diabetes, thyroid issues, hot flashes or night sweats. Psych:  No  mood changes, depression or anxiety. Pain:  No focal pain. Review of systems:  All other systems reviewed and found to be negative.   Physical Exam: Blood pressure 118/69, pulse 83, temperature (!) 95.2 F (35.1 C), temperature source Tympanic, resp. rate 18, weight 169 lb (76.7 kg), SpO2 99 %. GENERAL:  Well developed, well nourished, woman sitting comfortably in the exam room in no acute distress. MENTAL STATUS:  Alert and oriented to person, place and time. HEAD:  [name] hair pulled back.  Normocephalic, atraumatic, face symmetric, no Cushingoid features. EYES:  Blue eyes.  Pupils equal round and reactive to light and accomodation.  No conjunctivitis or scleral icterus. ENT:  Oropharynx clear without lesion.  Tongue normal. Mucous membranes moist.  RESPIRATORY:  Clear to auscultation without rales, wheezes or rhonchi. CARDIOVASCULAR:  Regular rate and rhythm without murmur, rub or gallop. ABDOMEN:  Soft, non-tender, with active bowel sounds, and no hepatosplenomegaly.  No masses. SKIN:  No  rashes, ulcers or lesions. EXTREMITIES: No edema, no skin discoloration or tenderness.  No palpable cords. LYMPH NODES: No palpable cervical, supraclavicular, axillary or inguinal adenopathy  NEUROLOGICAL: Unremarkable. PSYCH:  Appropriate.    Appointment on 02/01/2018  Component Date Value Ref Range Status  . Haptoglobin 02/01/2018 <10* 34 - 200 mg/dL Final   Comment: (NOTE) Performed At: [facility] [place], [place] [id number] Rush Farmer MD PJ:[id number] Performed at [facility], [address], [place], [place] [address]   . Retic Ct Pct 02/01/2018 5.7* 0.4 - 3.1 % Final  . RBC. 02/01/2018 2.93* 3.80 - 5.20 MIL/uL Final  . Retic Count, Absolute 02/01/2018 167.0  19.0 - 183.0 K/uL Final   Performed at [facility], [address] [place], [place] [address]  . Polyspecific AHG test 02/01/2018 POS   Final  . DAT, IgG 02/01/2018 POS   Final  .  DAT, complement 02/01/2018    Final                   Value:POS Performed at [facility], [address] [place], [place] [address]   . LDH 02/01/2018 198* 98 - 192 U/L Final   Performed at [facility], [address] [place], [place] [address]  . Sodium 02/01/2018 139  135 - 145 mmol/L Final  . Potassium 02/01/2018 4.2  3.5 - 5.1 mmol/L Final  . Chloride 02/01/2018 105  101 - 111 mmol/L Final  . CO2 02/01/2018 24  22 - 32 mmol/L Final  . Glucose, Bld 02/01/2018 119* 65 - 99 mg/dL Final  . BUN 02/01/2018 34* 6 - 20 mg/dL Final  . Creatinine, Ser 02/01/2018 1.23* 0.44 - 1.00 mg/dL Final  . Calcium 02/01/2018 9.0  8.9 - 10.3 mg/dL Final  . Total Protein 02/01/2018 7.0  6.5 - 8.1 g/dL Final  . Albumin 02/01/2018 4.2  3.5 - 5.0 g/dL Final  . AST 02/01/2018 16  15 - 41 U/L Final  . ALT 02/01/2018 11* 14 - 54 U/L Final  . Alkaline Phosphatase 02/01/2018 68  38 - 126 U/L Final  . Total Bilirubin 02/01/2018 0.8  0.3 - 1.2 mg/dL Final  . GFR calc non Af Amer 02/01/2018 47* >60 mL/min Final  . GFR calc Af Amer 02/01/2018 54* >60 mL/min Final   Comment: (NOTE) The eGFR has been calculated using the CKD EPI equation. This calculation has not been validated in all clinical situations. eGFR's persistently <60 mL/min signify possible Chronic Kidney Disease.   Georgiann Hahn gap 02/01/2018 10  5 - 15 Final   Performed at [facility], [address] [place], [place] [address]  . WBC 02/01/2018 5.6  3.6 - 11.0 K/uL Final  . RBC 02/01/2018 2.85* 3.80 - 5.20 MIL/uL Final  . Hemoglobin 02/01/2018 9.5* 12.0 - 16.0 g/dL Final  . HCT 02/01/2018 28.0* 35.0 - 47.0 % Final  . MCV 02/01/2018 98.3  80.0 - 100.0 fL Final  . MCH 02/01/2018 33.2  26.0 - 34.0 pg Final  . MCHC 02/01/2018 33.7  32.0 - 36.0 g/dL Final  . RDW 02/01/2018 15.4* 11.5 - 14.5 % Final  . Platelets 02/01/2018 154  150 - 440 K/uL Final  . Neutrophils Relative % 02/01/2018 82  % Final  . Neutro Abs 02/01/2018 4.6   1.4 - 6.5 K/uL Final  . Lymphocytes Relative 02/01/2018 12  % Final  . Lymphs Abs 02/01/2018 0.7* 1.0 - 3.6 K/uL Final  . Monocytes Relative  02/01/2018 4  % Final  . Monocytes Absolute 02/01/2018 0.2  0.2 - 0.9 K/uL Final  . Eosinophils Relative 02/01/2018 1  % Final  . Eosinophils Absolute 02/01/2018 0.1  0 - 0.7 K/uL Final  . Basophils Relative 02/01/2018 1  % Final  . 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 One week prior to presentation, Tina Page felt like Tina Page was coming down with something. Tina Page denied any fever, runny nose, sore throat or cough. Tina Page had some diarrhea. Tina Page denied any new medications or herbal products.  Anemia workup revealed a cold autoantibody (IgG and complement).  Reticulocyte count was 11.3%.  Ferritin was 562.  Iron studies revealed a saturation of 20% and a TIBC of 214 (low).  B12 was 231 (low normal).  Folate was 42.   Peripheral smear revealed rouleaux formation.  Additional testing included the following + studies:  hepatitis C antibody, hepatitis B core antibody, CMV IgM, and EBV VCA (IgM and IgG).  Hepatitis B by PCR was negative.  Hepatitis C RNA was negative.  Mycoplasma pneumonia IgM was negative.  Reticulocyte count was 11.3% (high) indicating appropriate marrow response.  C3 and C4 were normal.  Cold agglutinin titer was negative x 2.  Negative studies included:  ANA, hepatitis B surface antigen, SPEP, and free light chain ratio.   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  MMA was initially 330 (normal).  Repeat MMA was elevated on 08/01/2016 confirming B12 deficiency.  Hepatitis B surface antibody was positive and hepatitis B surface antigen was negative on  08/06/2016.  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 There was no adenopathy. Spleen was normal.  Abdominal ultrasound on 04/15/2017 revealed a normal spleen and sludge in the gallbladder.  The liver was echogenic consistent fatty infiltration and/or hepatocellular disease.  Tina Page underwent PTCA and stent placement in right and left common iliac arteries and left external iliac artery on 07/10/2016.  Tina Page is on Plavix.  EGD on 03/19/2015 revealed gastritis in the body and antrum. Colonoscopy on 03/19/2015 revealed one hyperplastic polyp. EGD on 07/08/2016 was normal.  No evidence of bleeding.  Tina Page has received 6 units of warmed PRBCs to date (last on 08/01/2016). If Rituxan is needed, Tina Page requires entecavir 0.5 mg q day beginning 2 weeks prior to Rituxan.  In addition, hepatitis B viral level and LFTs should be checked monthly.  Tina Page began steroids (1 mg/kg) on 08/01/2016.  Tina Page stopped prednisone on 03/12/2017.  Tina Page is on folic acid 1 mg a day.  Tina Page received B12 1000 mcg IM on 08/06/2016.  Tina Page is on oral B12.  B12 and folate were normal on 03/09/2017.  Tina Page developed peri-oral and intranasal herpes simplex-1.  Tina Page was treated with valacyclovir and doxycycline on 10/19/2016.  Tina Page developed a transient elevated alkaline phosphatase on 10/19/2016 which subsequently normalized.  Tina Page was documented to have a warm autoantibody on 10/12/2017.  LDH and bilirubin were normal.  Hematocrit had improved from prior testing.    Tina Page developed flu like symptoms on 10/26/2017.  Symptoms included cough, myalgias, and  fever (tmax unknown).  Tina Page was prescribed Mucinex, Tamiflu, and amoxicillin.  Tina Page only took amoxicillin x 5 days.  Tina Page developed increased liver function tests on 11/02/2017. CMV IgG was positive.  EBV VCA IgG, NA IgG,  early antigen antibody IgG were elevated.  EBV VCA IgM was < 36.  Testing was c/w a convalescence/past infection or reactivated infection.  LFTs normalized on 11/15/2017.  Smooth muscle antibody was 39 (high) on 11/10/2017 and 34 (high) on 11/30/2017.   Abdomen and pelvic CT on 11/10/2017 revealed a 2.2 cm spiculated density in right middle lobe concerning  for malignancy.   Chest CT on 11/16/2017 revealed a 2.2 x 2.0 x 2.4 spiculated RIGHT middle lobe mass.  There was tiny nonspecific upper lobe pulmonary nodules greater on RIGHT, largest 3 mm, of uncertain etiology.  There was additional 8 x 8 mm opacity in the posterior sulcus of the RIGHT lower lobe.  PET scan on 11/22/2017 revealed a 2 cm hypermetabolic right middle lobe lung mass (SUV 3.4) consistent  with primary lung neoplasm.  There were no findings for mediastinal/hilar lymphadenopathy or metastatic disease.  There were areas of hypermetabolism involving the left oblique abdominal muscles and the anorectal junction.  PFTs on 11/18/2017 revealed an FEV1 of 1.22 liters (51%).  DLCO adj was 6.8 mL/mmHg/min (32%).  ENB done on 12/07/2017. Cytology was negative for malignancy. Pathology demonstrated organizing pneumonia and chronic bronchitis. Pathologist commented that organizing pneumonia spanned about 2 mm in one fragment. Cases of focal organizing pneumonia with hypermetabolism on FDG-PET have been described, but changes of this type can also be adjacent to a neoplasm. Patient prescribed a daily dose of Prednisone 10 mg, with re-imaging planned in 6-8 weeks.   Tina Page began prednisone 10 mg on 12/07/2017.  Tina Page has renal insufficiency.  Creatinine has been followed:  1.17 on 11/05/2017, 1.85 on 11/09/2017, 1.38 on 11/12/2017, 1.74 on 11/15/2017, 1.66 on 11/17/2017, and 1.48 on 11/23/2017.  Urinalysis on 11/15/2017 revealed revealed no hemoglobin, bilirubin or active sediment.  Symptomatically, Tina Page feels "ok".  Tina Page has a chronic cough.  Hemoglobin  is 9.5 on steroids.  Reticulocyte count remains elevated at 5.7%.   Plan: 1.  Labs today:  CBC with diff, CMP, retic, haptoglobin, LDH, DAT. 2.  Discuss ongoing low grade hemolysis.  Discuss management with steroids (higher dose).  Discus second line therapy (Rituxan). 3.  Discuss RML lung mass and initial pathology revealing organizing pneumonia.  Discuss f/u imaging.  Phone follow-up with Dr. Mortimer Fries. 4.  Schedule chest CT. 5.  RTC after chest CT for MD assessment and discussion regarding direction of therapy.   Nolon Stalls, MD 02/01/2018, 4:35 PM

## 2018-02-01 NOTE — Progress Notes (Signed)
Patient states she has been dizzy off and on.  States her BP has been lower than usual.  Otherwise, offers no complaints.

## 2018-02-02 LAB — HAPTOGLOBIN: Haptoglobin: <10 mg/dL — ABNORMAL LOW (ref 34–200)

## 2018-02-11 ENCOUNTER — Telehealth: Payer: Self-pay | Admitting: Hematology and Oncology

## 2018-02-11 ENCOUNTER — Inpatient Hospital Stay: Payer: BLUE CROSS/BLUE SHIELD

## 2018-02-11 ENCOUNTER — Other Ambulatory Visit: Payer: Self-pay | Admitting: Hematology and Oncology

## 2018-02-11 ENCOUNTER — Telehealth: Payer: Self-pay | Admitting: *Deleted

## 2018-02-11 DIAGNOSIS — R197 Diarrhea, unspecified: Secondary | ICD-10-CM

## 2018-02-11 DIAGNOSIS — R918 Other nonspecific abnormal finding of lung field: Secondary | ICD-10-CM | POA: Diagnosis not present

## 2018-02-11 LAB — GASTROINTESTINAL PANEL BY PCR, STOOL (REPLACES STOOL CULTURE)

## 2018-02-11 LAB — C DIFFICILE QUICK SCREEN W PCR REFLEX
C Diff antigen: NEGATIVE
C Diff interpretation: NOT DETECTED
C Diff toxin: NEGATIVE

## 2018-02-11 MED ORDER — AZITHROMYCIN 500 MG PO TABS: 500.0000 mg | ORAL_TABLET | Freq: Every day | ORAL | 0 refills | Status: AC

## 2018-02-11 NOTE — Telephone Encounter (Signed)
  No.  Has she talked to Dr. Mortimer Fries?  I would like to send stool studies:  C diff and GI panel by PCR.  M

## 2018-02-11 NOTE — Telephone Encounter (Signed)
Re:  Diarrhea  Spoke to patient today about a 1 week history of diarrhea.  Stool is liquid brown.  No blood or mucus.  Bowel movement with oral intake.  Drinking fluids well.  No known weight loss.  No fever.  Stool sent for C diff and GI panel by PCR.  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  Given her immunocompromised states, Rx for Azithromycin 500 mg a day x 3 days electronically sent to her pharmacy ([facility]).  Patient to contact clinic if does not improve.  Discuss importance of hydration with fluids that have electrolytes.    Patient has chest CT on 02/15/2018 and f/u in clinic on 02/18/2018.  Discussed plan to contact clinic at anytime if not doing well.  Discussed symptom management clinic during the day (Monday - Friday) as well as ER after hours.  Spoke with Dr Mortimer Fries.  She remains on prednisone 10 mg a day for organizing pneumonia.  Discussed with patient that chronic hemolytic anemia may worsen with infection.  Discussed alternate treatment options for AIHA.  Multiple questions were asked and answered.   Lequita Asal, MD

## 2018-02-11 NOTE — Telephone Encounter (Signed)
Patient states she will be here in an hour for stool check. She staes she will contact Dr Mortimer Fries

## 2018-02-11 NOTE — Telephone Encounter (Signed)
Patient called reporting that she has had diarrhea for a week now and is asking if the prednisone could be causing it. Please advise

## 2018-02-14 ENCOUNTER — Telehealth: Payer: Self-pay | Admitting: *Deleted

## 2018-02-14 ENCOUNTER — Inpatient Hospital Stay: Payer: BLUE CROSS/BLUE SHIELD

## 2018-02-14 DIAGNOSIS — R197 Diarrhea, unspecified: Secondary | ICD-10-CM

## 2018-02-14 DIAGNOSIS — R918 Other nonspecific abnormal finding of lung field: Secondary | ICD-10-CM | POA: Diagnosis not present

## 2018-02-14 LAB — GASTROINTESTINAL PANEL BY PCR, STOOL (REPLACES STOOL CULTURE)

## 2018-02-14 LAB — C DIFFICILE QUICK SCREEN W PCR REFLEX
C Diff antigen: NEGATIVE
C Diff interpretation: NOT DETECTED
C Diff toxin: NEGATIVE

## 2018-02-14 NOTE — Telephone Encounter (Signed)
Per Dr Mike Gip, patient to come in for repeat stool check and depending on results, Dr Ola Spurr may see her

## 2018-02-14 NOTE — Telephone Encounter (Signed)
Patient called to report that she is still having diarrhea after 3 days of medications we gave her to take. Asking what she is to do. Please advise.

## 2018-02-14 NOTE — Telephone Encounter (Signed)
Patient agrees to come in at 1030 to give stool sample. Lab informed of add on appt

## 2018-02-15 ENCOUNTER — Ambulatory Visit: Payer: BLUE CROSS/BLUE SHIELD | Admitting: Internal Medicine

## 2018-02-16 ENCOUNTER — Ambulatory Visit
Admission: RE | Admit: 2018-02-16 | Discharge: 2018-02-16 | Disposition: A | Discharge status: Home / Self Care | Payer: BLUE CROSS/BLUE SHIELD | Source: Ambulatory Visit | Attending: Urgent Care | Admitting: Urgent Care

## 2018-02-16 DIAGNOSIS — J984 Other disorders of lung: Secondary | ICD-10-CM | POA: Insufficient documentation

## 2018-02-16 DIAGNOSIS — I700 Atherosclerosis of aorta: Secondary | ICD-10-CM | POA: Diagnosis not present

## 2018-02-16 DIAGNOSIS — R918 Other nonspecific abnormal finding of lung field: Secondary | ICD-10-CM

## 2018-02-17 ENCOUNTER — Ambulatory Visit (INDEPENDENT_AMBULATORY_CARE_PROVIDER_SITE_OTHER): Payer: BLUE CROSS/BLUE SHIELD | Admitting: Internal Medicine

## 2018-02-17 ENCOUNTER — Encounter: Payer: Self-pay | Admitting: Internal Medicine

## 2018-02-17 VITALS — BP 124/74 | HR 92 | Resp 16 | Ht 63.0 in | Wt 170.0 lb

## 2018-02-17 DIAGNOSIS — J8489 Other specified interstitial pulmonary diseases: Secondary | ICD-10-CM | POA: Diagnosis not present

## 2018-02-17 NOTE — Patient Instructions (Signed)
Patient can be weaned off of steroids for her organizing pneumonia  Please follow up with Dr Mike Gip for anemia and steroids

## 2018-02-17 NOTE — Progress Notes (Signed)
PULMONARY CONSULT NOTE  Requesting MD/Service: Mike Gip Date of initial consultation: 12/01/17 Reason for consultation: RML mass  PT PROFILE: 62 y.o. female former smoker referred for evaluation of incidentally found RML opacity concerning for malignancy   DATA: 11/10/17 CTAP: No intra-abdominal findings of note. 2.2 cm spiculated density is noted in right middle lobe concerning for malignancy. CT scan of the chest with intravenous contrast is recommended for further evaluation 11/16/17 CT chest: Spiculated R middle lobe mass 2.2 x 2.0 x 2.4 cm highly suspicious for a primary pulmonary neoplasm. Multiple tiny nonspecific upper lobe pulmonary nodules greater on R, largest 3 mm, of uncertain etiology; these could be postinflammatory but tiny metastatic foci are not excluded 11/22/17 PET: 2 cm right middle lobe lung mass is hypermetabolic and consistent with primary lung neoplasm. 2. No findings for mediastinal/hilar lymphadenopathy or metastatic disease 11/18/17 PFTs: FVC: 1.88 > 2.04 L (63 > 69 %pred),  FEV1: 1.22 > 1.23 L (51 %pred), FEV1/FVC: 65%, TLC: 4.00 L (83 %pred), DLCO 6.8, 32 %pred, DLCO/VA 55% 6.19.19 CT chest reviewed with patient Complete resolution of RML opacity  HPI: Follow up ENB results Dx with Organizing pneumonia Has been on prednisone therapy CT scan shows complete resolution of RML opacity CT scans reviewed with patient  Patient has no acute issues Uses albuterol infrequently No signs of infection at this time   ROS: No fever, myalgias/arthralgias, unexplained weight loss or weight gain No new focal weakness or sensory deficits No otalgia, hearing loss, visual changes, nasal and sinus symptoms, mouth and throat problems No neck pain or adenopathy No abdominal pain, N/V/D, diarrhea, change in bowel pattern No dysuria, change in urinary pattern   Vitals:   02/17/18 0920 02/17/18 0921  BP:  124/74  Pulse:  92  Resp: 16   SpO2:  99%  Weight: 170 lb (77.1  kg)   Height: 5\' 3"  (1.6 m)      EXAM:  Gen: [place], WDWN, No overt respiratory distress HEENT: NCAT, sclera white, oropharynx normal Neck: Supple without LAN, thyromegaly, JVD Lungs: breath sounds full without wheezes or other adventitious sounds Cardiovascular: RRR, no murmurs noted Abdomen: Soft, nontender, normal BS Ext: without clubbing, cyanosis, edema Neuro: CNs grossly intact, motor and sensory intact Skin: Limited exam, no lesions noted  DATA:   BMP Latest Ref Rng & Units 02/01/2018 12/27/2017 11/23/2017  Glucose 65 - 99 mg/dL 119(H) 102(H) 95  BUN 6 - 20 mg/dL 34(H) 32(H) 36(H)  Creatinine 0.44 - 1.00 mg/dL 1.23(H) 1.20(H) 1.48(H)  Sodium 135 - 145 mmol/L 139 135 137  Potassium 3.5 - 5.1 mmol/L 4.2 4.5 4.6  Chloride 101 - 111 mmol/L 105 102 106  CO2 22 - 32 mmol/L 24 24 22   Calcium 8.9 - 10.3 mg/dL 9.0 9.2 9.0    CBC Latest Ref Rng & Units 02/01/2018 12/27/2017 12/13/2017  WBC 3.6 - 11.0 K/uL 5.6 6.3 5.4  Hemoglobin 12.0 - 16.0 g/dL 9.5(L) 10.5(L) 9.7(L)  Hematocrit 35.0 - 47.0 % 28.0(L) 31.0(L) 29.2(L)  Platelets 150 - 440 K/uL 154 147(L) 174     IMPRESSION:   Moderate COPD - relatively asymptomatic-albuterol as needed RML opacity-complete resolution of opacity DX of Organizing pneumonia-recommend weaning off steroids   Follow up oncology for steroids and anemia   Patient  satisfied with Plan of action and management. All questions answered  Corrin Parker, M.D.  [facility]  Medical Director [place] Director [facility] Cardio-Pulmonary Department

## 2018-02-18 ENCOUNTER — Encounter: Payer: Self-pay | Admitting: Hematology and Oncology

## 2018-02-18 ENCOUNTER — Inpatient Hospital Stay (HOSPITAL_BASED_OUTPATIENT_CLINIC_OR_DEPARTMENT_OTHER): Payer: BLUE CROSS/BLUE SHIELD | Admitting: Hematology and Oncology

## 2018-02-18 ENCOUNTER — Other Ambulatory Visit: Payer: Self-pay

## 2018-02-18 ENCOUNTER — Inpatient Hospital Stay: Payer: BLUE CROSS/BLUE SHIELD

## 2018-02-18 VITALS — BP 129/76 | HR 73 | Temp 97.6°F | Resp 16 | Wt 169.4 lb

## 2018-02-18 DIAGNOSIS — D591 Autoimmune hemolytic anemia, unspecified: Secondary | ICD-10-CM

## 2018-02-18 DIAGNOSIS — A040 Enteropathogenic Escherichia coli infection: Secondary | ICD-10-CM

## 2018-02-18 DIAGNOSIS — R918 Other nonspecific abnormal finding of lung field: Secondary | ICD-10-CM

## 2018-02-18 LAB — CBC WITH DIFFERENTIAL/PLATELET
Basophils Absolute: 0.1 10*3/uL (ref 0–0.1)
Basophils Relative: 1 %
EOS ABS: 0.1 10*3/uL (ref 0–0.7)
EOS PCT: 1 %
HCT: 32.6 % — ABNORMAL LOW (ref 35.0–47.0)
Hemoglobin: 10.9 g/dL — ABNORMAL LOW (ref 12.0–16.0)
LYMPHS PCT: 8 %
Lymphs Abs: 0.6 10*3/uL — ABNORMAL LOW (ref 1.0–3.6)
MCH: 32.6 pg (ref 26.0–34.0)
MCHC: 33.4 g/dL (ref 32.0–36.0)
MCV: 97.6 fL (ref 80.0–100.0)
Monocytes Absolute: 0.2 10*3/uL (ref 0.2–0.9)
Monocytes Relative: 4 %
Neutro Abs: 5.9 10*3/uL (ref 1.4–6.5)
Neutrophils Relative %: 86 %
PLATELETS: 159 10*3/uL (ref 150–440)
RBC: 3.33 MIL/uL — AB (ref 3.80–5.20)
RDW: 15.3 % — ABNORMAL HIGH (ref 11.5–14.5)
WBC: 6.8 10*3/uL (ref 3.6–11.0)

## 2018-02-18 LAB — COMPREHENSIVE METABOLIC PANEL
ALT: 13 U/L — ABNORMAL LOW (ref 14–54)
AST: 17 U/L (ref 15–41)
Albumin: 4.3 g/dL (ref 3.5–5.0)
Alkaline Phosphatase: 67 U/L (ref 38–126)
Anion gap: 6 (ref 5–15)
BILIRUBIN TOTAL: 0.9 mg/dL (ref 0.3–1.2)
BUN: 25 mg/dL — AB (ref 6–20)
CO2: 25 mmol/L (ref 22–32)
CREATININE: 1.24 mg/dL — AB (ref 0.44–1.00)
Calcium: 9.7 mg/dL (ref 8.9–10.3)
Chloride: 108 mmol/L (ref 101–111)
GFR calc non Af Amer: 46 mL/min — ABNORMAL LOW (ref >60)
GFR, EST AFRICAN AMERICAN: 54 mL/min — AB (ref >60)
Glucose, Bld: 110 mg/dL — ABNORMAL HIGH (ref 65–99)
POTASSIUM: 4.3 mmol/L (ref 3.5–5.1)
Sodium: 139 mmol/L (ref 135–145)
Total Protein: 7.4 g/dL (ref 6.5–8.1)

## 2018-02-18 MED ORDER — CIPROFLOXACIN HCL 500 MG PO TABS: 500.0000 mg | ORAL_TABLET | Freq: Two times a day (BID) | ORAL | 0 refills | Status: DC

## 2018-02-18 NOTE — Progress Notes (Signed)
[facility] day: 02/18/2018   Chief Complaint: Tina Page is a 62 y.o. female with a a right middle lobe mass, warm autoimmune hemolytic anemia, and renal insufficiency who is seen for review of interval chest CT and discussion regarding direction of therapy.  HPI:  The patient was last seen in the hematology clinic on 02/01/2018.  At that time, she felt "ok".  She had a chronic cough.  Hemoglobin was 9.5 on steroids.  Reticulocyte count remained elevated at 5.7%.  She was on prednisone 10 mg a day.  We discussed follow-up imaging.  Labs on 02/01/2018 revealed a hematocrit of 28.0, hemoglobin 9.5, platelets 154,000, WBC 5600 with an ANC of 4600.  Coombs was positive (IgG, complement).  Haptoglobin was < 10.  LDH was 198.  Creatinine was 1.23.  Chest CT on 02/06/2018 revealed the spiculated 2.2 cm right middle lobe lesion seen on previous imaging studies was almost completely resolved with only a residual 4 mm irregular nodule identified at this location today on a background of architectural distortion/evolving scar. There was a 5 mm right parahilar nodule stable since 11/16/2017, but not present on the 07/07/2016 exam. No follow-up is needed if patient is low-risk. Non-contrast chest CT can be considered in 12 months if patient is high-risk.  There were tiny nodules identified in both upper lobes suggesting inhalational etiology. These were stable since the prior studies and also comparing back to an exam from 07/07/2016. Taken together, these are most consistent with benign etiology, likely scarring.  She was seen by Dr. Mortimer Fries on 02/17/2018.  Recommendation was to weak the patient off steroids.  She contacted the clinic on 02/11/2018 with diarrhea.  Stool was + for enteropathogenic E. coli (EPEC).  C diff was negative.  She was given a 3 day course of azithromycin.  Symptomatically, patient continues to have diarrhea. She is using PRN loperamide.  She otherwise feels "pretty good". She remains fatigued. Patient has no other acute concerns.   Patient maintains an adequate appetite, and notes that she is eating well. Weight, compared to her last visit to the clinic, has remained stable.   Patient denies pain in the clinic today.   Past Medical History:  Diagnosis Date  . Anemia   . Atherosclerosis of native arteries of extremity with intermittent claudication ([place]) 07/20/2016  . COPD (chronic obstructive pulmonary disease) ([place])   . 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 Elevated liver function tests 11/03/2017  . GERD (gastroesophageal reflux disease)   . Heme positive stool 01/29/2015  . HLD (hyperlipidemia)   . Hypertension   . Hypothyroidism   . 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 Thrombocytopenia ([place]) 10/04/2016    Past Surgical History:  Procedure Laterality Date  . ELECTROMAGNETIC NAVIGATION BROCHOSCOPY N/A 12/07/2017   Procedure: ELECTROMAGNETIC NAVIGATION BRONCHOSCOPY;  Surgeon: Flora Lipps, MD;  Location: [facility] ORS;  Service: Cardiopulmonary;  Laterality: N/A;  . ESOPHAGOGASTRODUODENOSCOPY (EGD) WITH PROPOFOL N/A 07/08/2016   Procedure: ESOPHAGOGASTRODUODENOSCOPY (EGD) WITH PROPOFOL;  Surgeon: Manya Silvas, MD;  Location: [facility] ENDOSCOPY;  Service: Endoscopy;  Laterality: N/A;  . KNEE SURGERY Right    repair of acl tear  . PERIPHERAL VASCULAR CATHETERIZATION N/A 07/10/2016   Procedure: Lower Extremity Angiography;  Surgeon: Katha Cabal, MD;  Location: [facility];  Service: Cardiovascular;  Laterality: N/A;  . PERIPHERAL VASCULAR CATHETERIZATION N/A 07/10/2016   Procedure: Abdominal Aortogram w/Lower Extremity;  Surgeon: Katha Cabal, MD;  Location: [facility] [facility];  Service: Cardiovascular;  Laterality: N/A;  . PERIPHERAL VASCULAR CATHETERIZATION  07/10/2016   Procedure: Lower Extremity Intervention;  Surgeon: Katha Cabal, MD;  Location: [facility];  Service: Cardiovascular;;    Family History  Problem Relation Age of Onset  . Diabetes Mother   . Hypertension Mother   . Diabetes Maternal Grandfather   . Hypertension Maternal Grandfather     Social History:  reports that she quit smoking about 19 months ago. Her smoking use included cigarettes. She smoked 0.50 packs per day. She has never used smokeless tobacco. She reports that she does not drink alcohol or use drugs.  She has a 21 pack year smoking history (1/2 pack/day from age [age]).  The patient works at [employer]  She is exposed to cold temperatures.  She works 3rd shift.  She has a daughter, Tina Page and a daughter named Tina Page.  She is alone today.   Allergies:  Allergies  Allergen Reactions  . Codeine Anaphylaxis    Current Medications: Current Outpatient Medications  Medication Sig Dispense Refill  . albuterol (PROVENTIL HFA;VENTOLIN HFA) 108 (90 Base) MCG/ACT inhaler Inhale 2 puffs into the lungs every 6 (six) hours as needed for wheezing or shortness of breath.     . allopurinol (ZYLOPRIM) 100 MG tablet TAKE 1 TABLET BY MOUTH EVERY DAY 90 tablet 1  . clopidogrel (PLAVIX) 75 MG tablet Take 1 tablet (75 mg total) by mouth daily. 30 tablet 5  . cyanocobalamin 500 MCG tablet Take 500 mcg by mouth daily.    [name] Kitchen docusate sodium (COLACE) 100 MG capsule Take 1 capsule (100 mg total) by mouth 2 (two) times daily. (Patient taking differently: Take 100 mg by mouth daily. ) 10 capsule 0  . folic acid (FOLVITE) 1 MG tablet TAKE 1 TABLET (1 MG TOTAL) BY MOUTH DAILY. 90 tablet 1  . hydrochlorothiazide (HYDRODIURIL) 25 MG tablet Take 25 mg by mouth daily.    [name] Kitchen levothyroxine (SYNTHROID, LEVOTHROID) 75 MCG tablet     . lisinopril (PRINIVIL,ZESTRIL) 20 MG tablet Take 20 mg by mouth daily.     [name] Kitchen omeprazole (PRILOSEC) 20 MG capsule Take 1 capsule (20 mg total) by mouth daily. 60 capsule 0  . potassium chloride (KLOR-CON) 20 MEQ packet Take 20 mEq by mouth  daily.    . pravastatin (PRAVACHOL) 40 MG tablet Take 40 mg by mouth every evening.    . predniSONE (DELTASONE) 20 MG tablet Take 0.5 tablets (10 mg total) by mouth daily. 30 tablet 1   No current facility-administered medications for this visit.     Review of Systems:  GENERAL:  Feels "pretty good".  Fatigue.  No fevers, sweats or weight loss.  Weight stable. PERFORMANCE STATUS (ECOG):  1 HEENT:  No visual changes, runny nose, sore throat, mouth sores or tenderness. Lungs: No shortness of breath or cough.  No hemoptysis. Cardiac:  No chest pain, palpitations, orthopnea, or PND. GI:  Diarrhea.  No nausea, vomiting, constipation, melena or hematochezia. GU:  No urgency, frequency, dysuria, or hematuria. Musculoskeletal:  No back pain.  No joint pain.  No muscle tenderness. Extremities:  No pain or swelling. Skin:  No rashes or skin changes. Neuro:  No headache, numbness or weakness, balance or coordination issues. Endocrine:  No diabetes, thyroid issues, hot flashes or night sweats. Psych:  No mood changes, depression or anxiety. Pain:  No focal pain. Review of systems:  All other systems reviewed and found to be negative.   Physical Exam: Blood pressure 129/76, pulse 73, temperature 97.6 F (36.4 C), temperature source Tympanic, resp. rate 16, weight 169 lb 6.4 oz (76.8 kg). GENERAL:  Well developed, well nourished, woman sitting comfortably in the exam room in no acute distress. MENTAL STATUS:  Alert and oriented to person, place and time. HEAD:  [name] hair.  Normocephalic, atraumatic, face full and symmetric, no Cushingoid features. EYES:  Blue eyes.  Pupils equal round and reactive to light and accomodation.  No conjunctivitis or scleral icterus. ENT:  Oropharynx clear without lesion.  Tongue normal. Mucous membranes moist.  RESPIRATORY:  Clear to auscultation without rales, wheezes or rhonchi. CARDIOVASCULAR:  Regular rate and rhythm without murmur, rub or gallop. ABDOMEN:  Soft,  non-tender, with active bowel sounds, and no hepatosplenomegaly.  No masses. SKIN:  No rashes, ulcers or lesions. EXTREMITIES: No edema, no skin discoloration or tenderness.  No palpable cords. LYMPH NODES: No palpable cervical, supraclavicular, axillary or inguinal adenopathy  NEUROLOGICAL: Unremarkable. PSYCH:  Appropriate.    No visits with results within 3 Day(s) from this visit.  Latest known visit with results is:  Appointment on 02/14/2018  Component Date Value Ref Range Status  . Campylobacter species 02/14/2018 NOT DETECTED  NOT DETECTED Final  . Plesimonas shigelloides 02/14/2018 NOT DETECTED  NOT DETECTED Final  . Salmonella species 02/14/2018 NOT DETECTED  NOT DETECTED Final  . Yersinia enterocolitica 02/14/2018 NOT DETECTED  NOT DETECTED Final  . Vibrio species 02/14/2018 NOT DETECTED  NOT DETECTED Final  . Vibrio cholerae 02/14/2018 NOT DETECTED  NOT DETECTED Final  . Enteroaggregative E coli (EAEC) 02/14/2018 NOT DETECTED  NOT DETECTED Final  . Enteropathogenic E coli (EPEC) 02/14/2018 DETECTED* NOT DETECTED Final   Comment: RESULT CALLED TO, READ BACK BY AND VERIFIED WITH: LAKISHA COBB @ 1513 ON 02/14/2018 BY CAF   . Enterotoxigenic E coli (ETEC) 02/14/2018 NOT DETECTED  NOT DETECTED Final  . Shiga like toxin producing E coli * 02/14/2018 NOT DETECTED  NOT DETECTED Final  . Shigella/Enteroinvasive E coli (EI* 02/14/2018 NOT DETECTED  NOT DETECTED Final  . Cryptosporidium 02/14/2018 NOT DETECTED  NOT DETECTED Final  . Cyclospora cayetanensis 02/14/2018 NOT DETECTED  NOT DETECTED Final  . Entamoeba histolytica 02/14/2018 NOT DETECTED  NOT DETECTED Final  . Giardia lamblia 02/14/2018 NOT DETECTED  NOT DETECTED Final  . Adenovirus F40/41 02/14/2018 NOT DETECTED  NOT DETECTED Final  . Astrovirus 02/14/2018 NOT DETECTED  NOT DETECTED Final  . Norovirus GI/GII 02/14/2018 NOT DETECTED  NOT DETECTED Final  . Rotavirus A 02/14/2018 NOT DETECTED  NOT DETECTED Final  . Sapovirus  (I, II, IV, and V) 02/14/2018 NOT DETECTED  NOT DETECTED Final   Performed at [facility], [address] [place], [place] [address]  . C Diff antigen 02/14/2018 NEGATIVE  NEGATIVE Final  . C Diff toxin 02/14/2018 NEGATIVE  NEGATIVE Final  . C Diff interpretation 02/14/2018 No C. difficile detected.   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 One week prior to presentation, she felt like she was coming down with something. She denied any fever, runny nose, sore throat or cough. She had some diarrhea. She denied any new medications or herbal products.  Anemia workup revealed  a cold autoantibody (IgG and complement).  Reticulocyte count was 11.3%.  Ferritin was 562.  Iron studies revealed a saturation of 20% and a TIBC of 214 (low).  B12 was 231 (low normal).  Folate was 42.   Peripheral smear revealed rouleaux formation.  Additional testing included the following + studies:  hepatitis C antibody, hepatitis B core antibody, CMV IgM, and EBV VCA (IgM and IgG).  Hepatitis B by PCR was negative.  Hepatitis C RNA was negative.  Mycoplasma pneumonia IgM was negative.  Reticulocyte count was 11.3% (high) indicating appropriate marrow response.  C3 and C4 were normal.  Cold agglutinin titer was negative x 2.  Negative studies included:  ANA, hepatitis B surface antigen, SPEP, and free light chain ratio.   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  MMA was initially 330 (normal).  Repeat MMA was elevated on 08/01/2016 confirming B12 deficiency.  Hepatitis B surface antibody was positive and hepatitis B surface antigen was negative on 08/06/2016.  Chest, abdomen, and pelvis CT angiogram on 07/07/2016 revealed moderate diffuse atherosclerotic vascular  disease of the abdominal aorta with severe stenosis of the left common iliac artery with suspected short segment occlusion. There was no adenopathy. Spleen was normal.  Abdominal ultrasound on 04/15/2017 revealed a normal spleen and sludge in the gallbladder.  The liver was echogenic consistent fatty infiltration and/or hepatocellular disease.  She underwent PTCA and stent placement in right and left common iliac arteries and left external iliac artery on 07/10/2016.  She is on Plavix.  EGD on 03/19/2015 revealed gastritis in the body and antrum. Colonoscopy on 03/19/2015 revealed one hyperplastic polyp. EGD on 07/08/2016 was normal.  No evidence of bleeding.  She has received 6 units of warmed PRBCs to date (last on 08/01/2016). If Rituxan is needed, she requires entecavir 0.5 mg q day beginning 2 weeks prior to Rituxan.  In addition, hepatitis B viral level and LFTs should be checked monthly.  She began steroids (1 mg/kg) on 08/01/2016.  She stopped prednisone on 03/12/2017.  She is on folic acid 1 mg a day.  She received B12 1000 mcg IM on 08/06/2016.  She is on oral B12.  B12 and folate were normal on 03/09/2017.  She developed peri-oral and intranasal herpes simplex-1.  She was treated with valacyclovir and doxycycline on 10/19/2016.  She developed a transient elevated alkaline phosphatase on 10/19/2016 which subsequently normalized.  She was documented to have a warm autoantibody on 10/12/2017.  LDH and bilirubin were normal.  Hematocrit had improved from prior testing.    She developed flu like symptoms on 10/26/2017.  Symptoms included cough, myalgias, and fever (tmax unknown).  She was prescribed Mucinex, Tamiflu, and amoxicillin.  She only took amoxicillin x 5 days.  She developed increased liver function tests on 11/02/2017. CMV IgG was positive.  EBV VCA IgG, NA IgG, early antigen antibody IgG were elevated.  EBV VCA IgM was < 36.  Testing was c/w a convalescence/past infection or  reactivated infection.  LFTs normalized on 11/15/2017.  Smooth muscle antibody was 39 (high) on 11/10/2017 and 34 (high) on 11/30/2017.   Abdomen and pelvic CT on 11/10/2017 revealed a 2.2 cm spiculated density in right middle lobe concerning  for malignancy.   Chest CT on 11/16/2017 revealed a 2.2 x 2.0 x 2.4 spiculated RIGHT middle lobe mass.  There was tiny nonspecific upper lobe pulmonary nodules greater on RIGHT, largest 3 mm, of uncertain etiology.  There was additional 8  x 8 mm opacity in the posterior sulcus of the RIGHT lower lobe.  PET scan on 11/22/2017 revealed a 2 cm hypermetabolic right middle lobe lung mass (SUV 3.4) consistent  with primary lung neoplasm.  There were no findings for mediastinal/hilar lymphadenopathy or metastatic disease.  There were areas of hypermetabolism involving the left oblique abdominal muscles and the anorectal junction.  PFTs on 11/18/2017 revealed an FEV1 of 1.22 liters (51%).  DLCO adj was 6.8 mL/mmHg/min (32%).  ENB done on 12/07/2017. Cytology was negative for malignancy. Pathology demonstrated organizing pneumonia and chronic bronchitis. Pathologist commented that organizing pneumonia spanned about 2 mm in one fragment. Cases of focal organizing pneumonia with hypermetabolism on FDG-PET have been described, but changes of this type can also be adjacent to a neoplasm. Patient prescribed a daily dose of Prednisone 10 mg, with re-imaging planned in 6-8 weeks.   Chest CT on 02/06/2018 revealed the spiculated 2.2 cm right middle lobe lesion was almost completely resolved with only a residual 4 mm irregular nodule identified at this location on a background of architectural distortion/evolving scar. There was a 5 mm right parahilar nodule stable since 11/16/2017,  She began prednisone 10 mg on 12/07/2017.  She has diarrhea.  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  She received azithromycin x 3 days.  She has renal  insufficiency.  Creatinine has been followed:  1.17 on 11/05/2017, 1.85 on 11/09/2017, 1.38 on 11/12/2017, 1.74 on 11/15/2017, 1.66 on 11/17/2017, and 1.48 on 11/23/2017.  Urinalysis on 11/15/2017 revealed revealed no hemoglobin, bilirubin or active sediment.  Symptomatically, she feels "ok".  She has a chronic fatigue. Patient has infectious diarrheal infection (E.coli).  Exam is unremarkable.   Plan: 1. Labs today: CBC with diff, CMP, SPEP, immunoglobulins, flow cytometry, G6PD assay. 2. Review interval chest CT- complete resolution of RML mass. 3. Discuss diarrhea of infectious etiology. Stool cultures (+) for E.coli.  ID recommends treatment with ciprofloxacin or Bactrim. Patient questioning Bactrim reaction in the past. Will treat with 10 day course of ciprofloxacin 500 mg BID. 4. Discuss hemolytic anemia. Patient remains on steroids (Prednisone) 10 mg daily. Discuss concerns related to tapering of steroid dose. Taper at this point could potentate hemolysis.  Discuss issues with Rituxan treatment (reactivation of hepatitis).  Continue as steroids prescribed in the interim. Will reach out to [facility] for further recommendations.   5. RTC in 2 weeks for MD assessment and review of workup.    Honor Loh, NP 02/18/2018, 11:41 AM   I saw and evaluated the patient, participating in the key portions of the service and reviewing pertinent diagnostic studies and records.  I reviewed the nurse practitioner's note and agree with the findings and the plan.  The assessment and plan were discussed with the patient.  Additional diagnostic studies of flow cytometry, SPEP, and immunoglobulins are needed to r/o an underlying lymphoproliferative disorder and would change the clinical management.  Multiple questions were asked by the patient and answered.   Nolon Stalls, MD 02/18/2018,11:41 AM

## 2018-02-20 LAB — PROTEIN ELECTROPHORESIS, SERUM
A/G RATIO SPE: 1.4 (ref 0.7–1.7)
ALPHA-1-GLOBULIN: 0.3 g/dL (ref 0.0–0.4)
ALPHA-2-GLOBULIN: 0.5 g/dL (ref 0.4–1.0)
Albumin ELP: 4 g/dL (ref 2.9–4.4)
Beta Globulin: 0.9 g/dL (ref 0.7–1.3)
GLOBULIN, TOTAL: 2.8 g/dL (ref 2.2–3.9)
Gamma Globulin: 1.1 g/dL (ref 0.4–1.8)
Total Protein ELP: 6.8 g/dL (ref 6.0–8.5)

## 2018-02-21 ENCOUNTER — Other Ambulatory Visit: Payer: Self-pay | Admitting: Urgent Care

## 2018-02-21 ENCOUNTER — Telehealth: Payer: Self-pay | Admitting: *Deleted

## 2018-02-21 DIAGNOSIS — B1920 Unspecified viral hepatitis C without hepatic coma: Secondary | ICD-10-CM

## 2018-02-21 DIAGNOSIS — B1910 Unspecified viral hepatitis B without hepatic coma: Secondary | ICD-10-CM

## 2018-02-21 DIAGNOSIS — A040 Enteropathogenic Escherichia coli infection: Secondary | ICD-10-CM

## 2018-02-21 LAB — COMP PANEL: LEUKEMIA/LYMPHOMA

## 2018-02-21 LAB — MULTIPLE MYELOMA PANEL, SERUM
ALPHA2 GLOB SERPL ELPH-MCNC: 0.4 g/dL (ref 0.4–1.0)
Albumin SerPl Elph-Mcnc: 4.1 g/dL (ref 2.9–4.4)
Albumin/Glob SerPl: 1.7 (ref 0.7–1.7)
Alpha 1: 0.2 g/dL (ref 0.0–0.4)
B-Globulin SerPl Elph-Mcnc: 0.9 g/dL (ref 0.7–1.3)
GAMMA GLOB SERPL ELPH-MCNC: 0.9 g/dL (ref 0.4–1.8)
GLOBULIN, TOTAL: 2.5 g/dL (ref 2.2–3.9)
IgA: 254 mg/dL (ref 87–352)
IgG (Immunoglobin G), Serum: 905 mg/dL (ref 700–1600)
IgM (Immunoglobulin M), Srm: 172 mg/dL (ref 26–217)
Total Protein ELP: 6.6 g/dL (ref 6.0–8.5)

## 2018-02-21 LAB — GLUCOSE 6 PHOSPHATE DEHYDROGENASE
G6PDH: 18.1 U/g{Hb} — AB (ref 4.6–13.5)
HEMOGLOBIN: 9 g/dL — AB (ref 11.1–15.9)

## 2018-02-21 NOTE — Telephone Encounter (Signed)
Patient referred to Tina Madeira, NP with [facility] in [place].   Tina Page

## 2018-02-21 NOTE — Telephone Encounter (Signed)
  Can she go to ID at [facility]?  M

## 2018-02-21 NOTE — Telephone Encounter (Signed)
  Please arrange  M

## 2018-02-21 NOTE — Telephone Encounter (Signed)
Called patient to inquire if she would be willing to go to [facility] to see ID there  She states she will.

## 2018-02-21 NOTE — Progress Notes (Signed)
Dr. Ola Spurr (ID) with [facility] is no longer with practice. Prior to him leaving last week, we were able to consult with him over the phone regarding this patient. ID physician recommended to start patient on 10 day course of ciprofloxacin for diarrhea of infectious etiology; stool (+) for EPEC.   Patient took single dose of prescribed antibiotic and experienced facial swelling. Medication discontinued.   Patient referred to Janene Madeira, NP with [facility] in [place] on 02/21/2018 for further evaluation and treatment of her EPEC (+) stools. I also indicated on the referral that this patient was both HBV and HCV (+).   Honor Loh, MSN, APRN, FNP-C, CEN Oncology/Hematology Nurse Practitioner  [facility] 02/21/18 6:14 PM

## 2018-02-21 NOTE — Telephone Encounter (Signed)
Patient called and states that after one dose of Cipro and her left jaw swelled up She called the pharmacy who told her not to take any more and to call us for possible something else. Please advise

## 2018-02-22 NOTE — Telephone Encounter (Signed)
Called patient to inform her that she has been referred to Janene Madeira @ [facility] in [place].

## 2018-03-04 ENCOUNTER — Encounter: Payer: Self-pay | Admitting: Infectious Diseases

## 2018-03-04 ENCOUNTER — Encounter: Payer: Self-pay | Admitting: Hematology and Oncology

## 2018-03-04 ENCOUNTER — Ambulatory Visit (INDEPENDENT_AMBULATORY_CARE_PROVIDER_SITE_OTHER): Payer: BLUE CROSS/BLUE SHIELD | Admitting: Infectious Diseases

## 2018-03-04 ENCOUNTER — Inpatient Hospital Stay: Payer: BLUE CROSS/BLUE SHIELD | Attending: Hematology and Oncology | Admitting: Hematology and Oncology

## 2018-03-04 ENCOUNTER — Inpatient Hospital Stay: Payer: BLUE CROSS/BLUE SHIELD

## 2018-03-04 VITALS — BP 132/79 | HR 86 | Temp 97.9°F | Ht 63.0 in | Wt 171.0 lb

## 2018-03-04 VITALS — BP 106/66 | HR 97 | Temp 97.3°F | Wt 172.1 lb

## 2018-03-04 DIAGNOSIS — B1910 Unspecified viral hepatitis B without hepatic coma: Secondary | ICD-10-CM | POA: Diagnosis not present

## 2018-03-04 DIAGNOSIS — E538 Deficiency of other specified B group vitamins: Secondary | ICD-10-CM

## 2018-03-04 DIAGNOSIS — J449 Chronic obstructive pulmonary disease, unspecified: Secondary | ICD-10-CM

## 2018-03-04 DIAGNOSIS — D591 Autoimmune hemolytic anemia, unspecified: Secondary | ICD-10-CM

## 2018-03-04 DIAGNOSIS — R768 Other specified abnormal immunological findings in serum: Secondary | ICD-10-CM | POA: Diagnosis not present

## 2018-03-04 DIAGNOSIS — A040 Enteropathogenic Escherichia coli infection: Secondary | ICD-10-CM

## 2018-03-04 DIAGNOSIS — B1920 Unspecified viral hepatitis C without hepatic coma: Secondary | ICD-10-CM | POA: Diagnosis not present

## 2018-03-04 DIAGNOSIS — E039 Hypothyroidism, unspecified: Secondary | ICD-10-CM

## 2018-03-04 DIAGNOSIS — R918 Other nonspecific abnormal finding of lung field: Secondary | ICD-10-CM

## 2018-03-04 DIAGNOSIS — Z87891 Personal history of nicotine dependence: Secondary | ICD-10-CM | POA: Diagnosis not present

## 2018-03-04 LAB — CBC WITH DIFFERENTIAL/PLATELET
Basophils Absolute: 0 10*3/uL (ref 0–0.1)
Basophils Relative: 1 %
Eosinophils Absolute: 0.1 10*3/uL (ref 0–0.7)
Eosinophils Relative: 1 %
HCT: 34.4 % — ABNORMAL LOW (ref 35.0–47.0)
Hemoglobin: 11.5 g/dL — ABNORMAL LOW (ref 12.0–16.0)
Lymphocytes Relative: 9 %
Lymphs Abs: 0.6 10*3/uL — ABNORMAL LOW (ref 1.0–3.6)
MCH: 32.4 pg (ref 26.0–34.0)
MCHC: 33.3 g/dL (ref 32.0–36.0)
MCV: 97.4 fL (ref 80.0–100.0)
Monocytes Absolute: 0.3 10*3/uL (ref 0.2–0.9)
Monocytes Relative: 4 %
Neutro Abs: 6.2 10*3/uL (ref 1.4–6.5)
Neutrophils Relative %: 85 %
Platelets: 188 10*3/uL (ref 150–440)
RBC: 3.54 MIL/uL — ABNORMAL LOW (ref 3.80–5.20)
RDW: 15 % — ABNORMAL HIGH (ref 11.5–14.5)
WBC: 7.2 10*3/uL (ref 3.6–11.0)

## 2018-03-04 LAB — BASIC METABOLIC PANEL
Anion gap: 7 (ref 5–15)
BUN: 29 mg/dL — ABNORMAL HIGH (ref 6–20)
CO2: 26 mmol/L (ref 22–32)
Calcium: 9.1 mg/dL (ref 8.9–10.3)
Chloride: 107 mmol/L (ref 98–111)
Creatinine, Ser: 1.24 mg/dL — ABNORMAL HIGH (ref 0.44–1.00)
GFR calc Af Amer: 54 mL/min — ABNORMAL LOW (ref >60)
GFR calc non Af Amer: 46 mL/min — ABNORMAL LOW (ref >60)
Glucose, Bld: 103 mg/dL — ABNORMAL HIGH (ref 70–99)
Potassium: 4.6 mmol/L (ref 3.5–5.1)
Sodium: 140 mmol/L (ref 135–145)

## 2018-03-04 MED ORDER — SULFAMETHOXAZOLE-TRIMETHOPRIM 800-160 MG PO TABS: 1.0000 | ORAL_TABLET | Freq: Two times a day (BID) | ORAL | 0 refills | Status: AC

## 2018-03-04 NOTE — Patient Instructions (Addendum)
Nice to meet you today!   Would like to try Bactrim 1 tablet twice a day for 10 days. Will obtain stool sample today to run culture on so we can get a plan B in place if you fail this or you cannot tolerate the bactrim.   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 It is not active/chronic. You seemed to have cleared this infection naturally at one point. There is no risk for re-activating this with Rituximab.   Regarding your hepatitis b labs - you have positive results indicating PAST infection. There is a chance (research varies as to how often but it is between 3 - 40%) of reactivating the hepatitis b with Rituximab. Would at the minimum monitor you through blood work to see if you need medication if there is no other option outside rituximab.   Please come back in 3 weeks for follow up.

## 2018-03-04 NOTE — Assessment & Plan Note (Signed)
Positive Hep C Ab and negative RNA. We discussed that ~25% of people clear this naturally and it appears she is one of them. There is nothing further needed for ongoing monitoring with this as she has clinical cure and no risk for re-activation.

## 2018-03-04 NOTE — Progress Notes (Signed)
[facility] day: 03/04/2018   Chief Complaint: Tina Page is a 62 y.o. female with a a right middle lobe mass, warm autoimmune hemolytic anemia, and renal insufficiency who is seen for 2 week assessment.  HPI:  The patient was last seen in the hematology clinic on 02/18/2018.  At that time, she felt "ok".  She had a chronic fatigue. Patient had infectious diarrheal infection (E.coli).  Exam was unremarkable. Because of a prior reaction to Septra, she was prescribed ciprofloxacin.  She was referred to ID in [place]. She remained on prednisone 10 mg a day.    SPEP and immunoglobulins were normal.  G6PD level was elevated (not low).  Flow cytometry was negative.  She has had ongoing diarrhea.  Ciprofloxacin did not help.  She was seen by Janene Madeira, NP from infectious disease in [place].  She was put on Bactrim x 10 days.  She had her first dose today.  During the interim,she denies any new complaints.  She denies any bruising or bleeding.  She denies any change in urine color.  Diarrhea continues.   Past Medical History:  Diagnosis Date  . Anemia   . Atherosclerosis of native arteries of extremity with intermittent claudication ([place]) 07/20/2016  . COPD (chronic obstructive pulmonary disease) ([place])   . 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 Elevated liver function tests 11/03/2017  . GERD (gastroesophageal reflux disease)   . Heme positive stool 01/29/2015  . Hepatitis C 07/12/2016  . HLD (hyperlipidemia)   . Hypertension   . Hypothyroidism   . 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 Thrombocytopenia ([place]) 10/04/2016    Past Surgical History:  Procedure Laterality Date  . ELECTROMAGNETIC NAVIGATION BROCHOSCOPY N/A 12/07/2017   Procedure: ELECTROMAGNETIC NAVIGATION BRONCHOSCOPY;  Surgeon: Flora Lipps, MD;  Location: [facility] ORS;  Service: Cardiopulmonary;  Laterality: N/A;  .  ESOPHAGOGASTRODUODENOSCOPY (EGD) WITH PROPOFOL N/A 07/08/2016   Procedure: ESOPHAGOGASTRODUODENOSCOPY (EGD) WITH PROPOFOL;  Surgeon: Manya Silvas, MD;  Location: [facility] ENDOSCOPY;  Service: Endoscopy;  Laterality: N/A;  . KNEE SURGERY Right    repair of acl tear  . PERIPHERAL VASCULAR CATHETERIZATION N/A 07/10/2016   Procedure: Lower Extremity Angiography;  Surgeon: Katha Cabal, MD;  Location: [facility];  Service: Cardiovascular;  Laterality: N/A;  . PERIPHERAL VASCULAR CATHETERIZATION N/A 07/10/2016   Procedure: Abdominal Aortogram w/Lower Extremity;  Surgeon: Katha Cabal, MD;  Location: [place] [facility];  Service: Cardiovascular;  Laterality: N/A;  . PERIPHERAL VASCULAR CATHETERIZATION  07/10/2016   Procedure: Lower Extremity Intervention;  Surgeon: Katha Cabal, MD;  Location: [facility];  Service: Cardiovascular;;    Family History  Problem Relation Age of Onset  . Diabetes Mother   . Hypertension Mother   . Diabetes Maternal Grandfather   . Hypertension Maternal Grandfather     Social History:  reports that she quit smoking about 19 months ago. Her smoking use included cigarettes. She smoked 0.50 packs per day. She has never used smokeless tobacco. She reports that she does not drink alcohol or use drugs.  She has a 21 pack year smoking history (1/2 pack/day from age [age]).  The patient works at [employer]  She is exposed to cold temperatures.  She works 3rd shift.  She has a daughter, Tina Page and a daughter named Tina Page.  She is alone today.   Allergies:  Allergies  Allergen Reactions  .  Codeine Anaphylaxis  . Ciprofloxacin Swelling    Facial swelling following single oral dose.     Current Medications: Current Outpatient Medications  Medication Sig Dispense Refill  . albuterol (PROVENTIL HFA;VENTOLIN HFA) 108 (90 Base) MCG/ACT inhaler Inhale 2 puffs into the lungs every 6 (six) hours as needed for wheezing or shortness  of breath.     . allopurinol (ZYLOPRIM) 100 MG tablet TAKE 1 TABLET BY MOUTH EVERY DAY 90 tablet 1  . clopidogrel (PLAVIX) 75 MG tablet Take 1 tablet (75 mg total) by mouth daily. 30 tablet 5  . cyanocobalamin 500 MCG tablet Take 500 mcg by mouth daily.    [name] Kitchen docusate sodium (COLACE) 100 MG capsule Take 1 capsule (100 mg total) by mouth 2 (two) times daily. (Patient taking differently: Take 100 mg by mouth daily. ) 10 capsule 0  . folic acid (FOLVITE) 1 MG tablet TAKE 1 TABLET (1 MG TOTAL) BY MOUTH DAILY. 90 tablet 1  . hydrochlorothiazide (HYDRODIURIL) 25 MG tablet Take 25 mg by mouth daily.    [name] Kitchen levothyroxine (SYNTHROID, LEVOTHROID) 75 MCG tablet     . lisinopril (PRINIVIL,ZESTRIL) 20 MG tablet Take 20 mg by mouth daily.     [name] Kitchen omeprazole (PRILOSEC) 20 MG capsule Take 1 capsule (20 mg total) by mouth daily. 60 capsule 0  . potassium chloride (KLOR-CON) 20 MEQ packet Take 20 mEq by mouth daily.    . pravastatin (PRAVACHOL) 40 MG tablet Take 40 mg by mouth every evening.    . predniSONE (DELTASONE) 20 MG tablet Take 0.5 tablets (10 mg total) by mouth daily. 30 tablet 1  . sulfamethoxazole-trimethoprim (BACTRIM DS,SEPTRA DS) 800-160 MG tablet Take 1 tablet by mouth 2 (two) times daily for 10 days. 30 tablet 0   No current facility-administered medications for this visit.     Review of Systems:  GENERAL:  Feels "ok".  Chronic fatigue.  No fevers, sweats or weight loss.  Weight up 3 pounds. PERFORMANCE STATUS (ECOG):  1 HEENT:  No visual changes, runny nose, sore throat, mouth sores or tenderness. Lungs: No shortness of breath or cough.  No hemoptysis. Cardiac:  No chest pain, palpitations, orthopnea, or PND. GI:  Diarrhea.  No nausea, vomiting, constipation, melena or hematochezia. GU:  No urgency, frequency, dysuria, or hematuria. Musculoskeletal:  No back pain.  No joint pain.  No muscle tenderness. Extremities:  No pain or swelling. Skin:  No rashes or skin changes. Neuro:  No headache,  numbness or weakness, balance or coordination issues. Endocrine:  No diabetes, thyroid issues, hot flashes or night sweats. Psych:  No mood changes, depression or anxiety. Pain:  No focal pain. Review of systems:  All other systems reviewed and found to be negative.   Physical Exam: Blood pressure 106/66, pulse 97, temperature (!) 97.3 F (36.3 C), temperature source Tympanic, weight 172 lb 1.6 oz (78.1 kg). GENERAL:  Well developed, well nourished, woman sitting comfortably in the exam room in no acute distress. MENTAL STATUS:  Alert and oriented to person, place and time. HEAD:  [name] hair.  Normocephalic, atraumatic, face symmetric, no Cushingoid features. EYES:  Blue eyes.  Pupils equal round and reactive to light and accomodation.  No conjunctivitis or scleral icterus. ENT:  Oropharynx clear without lesion.  Tongue normal. Mucous membranes moist.  RESPIRATORY:  Clear to auscultation without rales, wheezes or rhonchi. CARDIOVASCULAR:  Regular rate and rhythm without murmur, rub or gallop. ABDOMEN:  Soft, non-tender, with active bowel sounds, and no hepatosplenomegaly.  No  masses. SKIN:  No rashes, ulcers or lesions. EXTREMITIES: No edema, no skin discoloration or tenderness.  No palpable cords. LYMPH NODES: No palpable cervical, supraclavicular, axillary or inguinal adenopathy  NEUROLOGICAL: Unremarkable. PSYCH:  Appropriate.    No visits with results within 3 Day(s) from this visit.  Latest known visit with results is:  Orders Only on 02/18/2018  Component Date Value Ref Range Status  . PATH INTERP XXX-IMP 02/18/2018 Comment   Final   No significant immunophenotypic abnormality detected  . CLINICAL INFO 02/18/2018 Comment   Corrected   Comment: (NOTE) Accompanying CBC dated 02-18-18 shows: WBC count 6.8, Neu 5.9, Lym 0.6, Mon 0.2   . Misc Source 02/18/2018 Comment   Final   Peripheral blood  . ASSESSMENT OF LEUKOCYTES 02/18/2018 Comment   Final   Comment: (NOTE) No  monoclonal B cell population is detected. There is no loss of, or aberrant expression of, the pan T cell antigens to suggest a neoplastic T cell process. CD4:CD8 ratio 3.0 No circulating blasts are detected. There is no immunophenotypic  evidence of abnormal myeloid maturation. Analysis of the leukocyte population shows: granulocytes 90%, monocytes 3%, lymphocytes 7%, blasts <0.1%, B cells 2%, T cells 5%, NK cells <1%.   . % Viable Cells 02/18/2018 Comment   Corrected   97%  . ANALYSIS AND GATING STRATEGY 02/18/2018 Comment   Final   8 color analysis with CD45/SSC  . IMMUNOPHENOTYPING STUDY 02/18/2018 Comment   Final   Comment: (NOTE) CD2       Normal         CD3       Normal CD4       Normal         CD5       Normal CD7       Normal         CD8       Normal CD10      Normal         CD11b     Normal CD13      Normal         CD14      Normal CD16      Normal         CD19      Normal CD20      Normal         CD33      Normal CD34      Normal         CD38      Normal CD45      Normal         CD56      Normal CD57      Normal         CD117     Normal HLA-DR    Normal         KAPPA     Normal LAMBDA    Normal         CD64      Normal   . PATHOLOGIST NAME 02/18/2018 Comment   Final   Henrietta Hoover, M.D.  . COMMENT: 02/18/2018 Comment   Corrected   Comment: (NOTE) Each antibody in this assay was utilized to assess for potential abnormalities of studied cell populations or to characterize identified abnormalities. This test was developed and its performance characteristics determined by [facility].  It has not been cleared or approved by the U.S. Food and Drug Administration. The FDA has determined that such clearance or  approval is not necessary. This test is used for clinical purposes.  It should not be regarded as investigational or for research. Performed At: -[facility] [address] [place], [place] [id number] Nechama Guard MD KZ:[id number] Performed At: [facility] [address] [place], [place] [id number] Nechama Guard MD [id number] Performed at [facility], [address], [place], [place] [address]   . Hemoglobin 02/18/2018 9.0* 11.1 - 15.9 g/dL Final  . G6PDH 02/18/2018 18.1* 4.6 - 13.5 U/g Hb Final   Comment: (NOTE) When decreased, G-6-PD, Quant. values are associated with acute hemolytic anemia when deficient individuals are exposed to oxidative stress, such as with certain medications (e.g., primaquine), infection, or ingestion of fava beans. Caution: In patients with acute hemolysis (e.g., abnormally low RBC values), testing for G-6-PD may be falsely normal because older erythrocytes with a higher enzyme deficiency have been hemolyzed. Young erythrocytes and reticulocytes have normal or near-normal enzyme activity. Normal values of G-6-PD may be measured for several weeks following a hemolytic event. Performed At: [facility] [place], [place] [id number] Rush Farmer MD [id number] Performed at [facility], [address], [place], [place] [address]   . IgG (Immunoglobin G), Serum 02/18/2018 905  700 - 1,600 mg/dL Final  . IgA 02/18/2018 254  87 - 352 mg/dL Final  . IgM (Immunoglobulin M), Srm 02/18/2018 172  26 - 217 mg/dL Final  . Total Protein ELP 02/18/2018 6.6  6.0 - 8.5 g/dL Corrected  . Albumin SerPl Elph-Mcnc 02/18/2018 4.1  2.9 - 4.4 g/dL Corrected  . Alpha 1 02/18/2018 0.2  0.0 - 0.4 g/dL Corrected  . Alpha2 Glob SerPl Elph-Mcnc 02/18/2018 0.4  0.4 - 1.0 g/dL Corrected  . B-Globulin SerPl Elph-Mcnc 02/18/2018 0.9  0.7 - 1.3 g/dL Corrected  . Gamma Glob SerPl Elph-Mcnc 02/18/2018 0.9  0.4 - 1.8 g/dL Corrected  . M Protein SerPl Elph-Mcnc 02/18/2018 Not Observed  Not Observed g/dL Corrected  . Globulin, Total 02/18/2018 2.5  2.2 - 3.9 g/dL Corrected  . Albumin/Glob SerPl 02/18/2018 1.7  0.7 - 1.7 Corrected  . IFE 1 02/18/2018 Comment   Corrected   An apparent  normal immunofixation pattern.  . Please Note 02/18/2018 Comment   Corrected   Comment: (NOTE) Protein electrophoresis scan will follow via computer, mail, or courier delivery. Performed At: [facility] [place], [place] [id number] Rush Farmer MD IW:[id number] Performed at [facility], [address], [place], [place] [address]   . Total Protein ELP 02/18/2018 6.8  6.0 - 8.5 g/dL Final  . Albumin ELP 02/18/2018 4.0  2.9 - 4.4 g/dL Final  . Alpha-1-Globulin 02/18/2018 0.3  0.0 - 0.4 g/dL Final  . Alpha-2-Globulin 02/18/2018 0.5  0.4 - 1.0 g/dL Final  . Beta Globulin 02/18/2018 0.9  0.7 - 1.3 g/dL Final  . Gamma Globulin 02/18/2018 1.1  0.4 - 1.8 g/dL Final  . M-Spike, % 02/18/2018 Not Observed  Not Observed g/dL Final  . SPE Interp. 02/18/2018 Comment   Final   Comment: (NOTE) The SPE pattern appears essentially unremarkable. Evidence of monoclonal protein is not apparent. Performed At: [facility] [place], [place] [id number] Rush Farmer MD IH:[id number]   . Comment 02/18/2018 Comment   Final   Comment: (NOTE) Protein electrophoresis scan will follow via computer, mail, or courier delivery.   [name] Kitchen GLOBULIN, TOTAL 02/18/2018 2.8  2.2 - 3.9 g/dL Corrected  . A/G Ratio 02/18/2018 1.4  0.7 - 1.7 Corrected   Performed at [facility], [address] [place], [place] [address]  . Sodium 02/18/2018 139  135 - 145 mmol/L Final  . Potassium 02/18/2018 4.3  3.5 - 5.1 mmol/L Final  . Chloride 02/18/2018 108  101 - 111 mmol/L Final  . CO2 02/18/2018 25  22 - 32 mmol/L Final  . Glucose, Bld 02/18/2018 110* 65 - 99 mg/dL Final  . BUN 02/18/2018 25* 6 - 20 mg/dL Final  . Creatinine, Ser 02/18/2018 1.24* 0.44 - 1.00 mg/dL Final  . Calcium 02/18/2018 9.7  8.9 - 10.3 mg/dL Final  . Total Protein 02/18/2018 7.4  6.5 - 8.1 g/dL Final  . Albumin 02/18/2018 4.3  3.5 - 5.0 g/dL Final  . AST 02/18/2018 17  15 - 41 U/L Final  . ALT  02/18/2018 13* 14 - 54 U/L Final  . Alkaline Phosphatase 02/18/2018 67  38 - 126 U/L Final  . Total Bilirubin 02/18/2018 0.9  0.3 - 1.2 mg/dL Final  . GFR calc non Af Amer 02/18/2018 46* >60 mL/min Final  . GFR calc Af Amer 02/18/2018 54* >60 mL/min Final   Comment: (NOTE) The eGFR has been calculated using the CKD EPI equation. This calculation has not been validated in all clinical situations. eGFR's persistently <60 mL/min signify possible Chronic Kidney Disease.   Georgiann Hahn gap 02/18/2018 6  5 - 15 Final   Performed at [facility], [address] [place], [place] [address]  . WBC 02/18/2018 6.8  3.6 - 11.0 K/uL Final  . RBC 02/18/2018 3.33* 3.80 - 5.20 MIL/uL Final  . Hemoglobin 02/18/2018 10.9* 12.0 - 16.0 g/dL Final  . HCT 02/18/2018 32.6* 35.0 - 47.0 % Final  . MCV 02/18/2018 97.6  80.0 - 100.0 fL Final  . MCH 02/18/2018 32.6  26.0 - 34.0 pg Final  . MCHC 02/18/2018 33.4  32.0 - 36.0 g/dL Final  . RDW 02/18/2018 15.3* 11.5 - 14.5 % Final  . Platelets 02/18/2018 159  150 - 440 K/uL Final  . Neutrophils Relative % 02/18/2018 86  % Final  . Neutro Abs 02/18/2018 5.9  1.4 - 6.5 K/uL Final  . Lymphocytes Relative 02/18/2018 8  % Final  . Lymphs Abs 02/18/2018 0.6* 1.0 - 3.6 K/uL Final  . Monocytes Relative 02/18/2018 4  % Final  . Monocytes Absolute 02/18/2018 0.2  0.2 - 0.9 K/uL Final  . Eosinophils Relative 02/18/2018 1  % Final  . Eosinophils Absolute 02/18/2018 0.1  0 - 0.7 K/uL Final  . Basophils Relative 02/18/2018 1  % Final  . 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 One week prior to presentation, she felt like she was coming down with something. She denied any fever, runny nose, sore throat or cough. She had  some diarrhea. She denied any new medications or herbal products.  Anemia workup revealed a cold autoantibody (IgG and complement).  Reticulocyte count was 11.3%.  Ferritin was 562.  Iron studies revealed a saturation of 20% and a TIBC of 214 (low).  B12 was 231 (low normal).  Folate was 42.   Peripheral smear revealed rouleaux formation.  Labs on 02/18/2018 revealed normal flow cytometry, SPEP, and immunoglobulins.  Additional testing included the following + studies:  hepatitis C antibody, hepatitis B core antibody, CMV IgM, and EBV VCA (IgM and IgG).  Hepatitis B by PCR was negative.  Hepatitis C RNA was negative.  Mycoplasma pneumonia IgM was negative.  Reticulocyte count was 11.3% (high) indicating appropriate marrow response.  C3 and C4 were normal.  Cold agglutinin titer was negative x 2.  Negative studies included:  ANA, hepatitis B surface antigen, SPEP, and free light chain ratio.   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  MMA was initially 330 (normal).  Repeat MMA was elevated on 08/01/2016 confirming B12 deficiency.  Hepatitis B surface antibody was positive and hepatitis B surface antigen was negative on 08/06/2016.  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 There was no adenopathy. Spleen was normal.  Abdominal ultrasound on 04/15/2017 revealed a normal spleen and sludge in the gallbladder.  The liver was echogenic consistent fatty infiltration and/or hepatocellular disease.  She underwent PTCA and stent placement in right and left common iliac arteries and left external iliac artery on 07/10/2016.  She is on Plavix.  EGD on 03/19/2015 revealed gastritis in the body and antrum. Colonoscopy on 03/19/2015 revealed one hyperplastic polyp. EGD on 07/08/2016 was normal.  No  evidence of bleeding.  She has received 6 units of warmed PRBCs to date (last on 08/01/2016). If Rituxan is needed, she requires entecavir 0.5 mg q day beginning 2 weeks prior to Rituxan.  In addition, hepatitis B viral level and LFTs should be checked monthly.  She began steroids (1 mg/kg) on 08/01/2016.  She stopped prednisone on 03/12/2017.  She is on folic acid 1 mg a day.  She received B12 1000 mcg IM on 08/06/2016.  She is on oral B12.  B12 and folate were normal on 03/09/2017 and 10/12/2017.  She developed peri-oral and intranasal herpes simplex-1.  She was treated with valacyclovir and doxycycline on 10/19/2016.  She developed a transient elevated alkaline phosphatase on 10/19/2016 which subsequently normalized.  She was documented to have a warm autoantibody on 10/12/2017.  LDH and bilirubin were normal.  Hematocrit had improved from prior testing.    She developed flu like symptoms on 10/26/2017.  Symptoms included cough, myalgias, and fever (tmax unknown).  She was prescribed Mucinex, Tamiflu, and amoxicillin.  She only took amoxicillin x 5 days.  She developed increased liver function tests on 11/02/2017. CMV IgG was positive.  EBV VCA IgG, NA IgG, early antigen antibody IgG were elevated.  EBV VCA IgM was < 36.  Testing was c/w a convalescence/past infection or reactivated infection.  LFTs normalized on 11/15/2017.  Smooth muscle antibody was 39 (high) on 11/10/2017 and 34 (high) on 11/30/2017.   Abdomen and pelvic CT on 11/10/2017 revealed a 2.2 cm spiculated density in right middle lobe concerning  for malignancy.   Chest CT on 11/16/2017 revealed a 2.2 x 2.0 x 2.4 spiculated RIGHT middle lobe mass.  There was tiny nonspecific upper lobe pulmonary nodules greater on RIGHT, largest 3 mm, of uncertain etiology.  There was additional 8 x 8 mm opacity in the posterior sulcus of the RIGHT lower lobe.  PET scan on 11/22/2017 revealed a 2 cm hypermetabolic right middle lobe lung mass (SUV 3.4)  consistent  with primary lung neoplasm.  There were no findings for mediastinal/hilar lymphadenopathy or  metastatic disease.  There were areas of hypermetabolism involving the left oblique abdominal muscles and the anorectal junction.  PFTs on 11/18/2017 revealed an FEV1 of 1.22 liters (51%).  DLCO adj was 6.8 mL/mmHg/min (32%).  ENB done on 12/07/2017. Cytology was negative for malignancy. Pathology demonstrated organizing pneumonia and chronic bronchitis. Pathologist commented that organizing pneumonia spanned about 2 mm in one fragment. Cases of focal organizing pneumonia with hypermetabolism on FDG-PET have been described, but changes of this type can also be adjacent to a neoplasm. Patient prescribed a daily dose of Prednisone 10 mg, with re-imaging planned in 6-8 weeks.   Chest CT on 02/06/2018 revealed the spiculated 2.2 cm right middle lobe lesion was almost completely resolved with only a residual 4 mm irregular nodule identified at this location on a background of architectural distortion/evolving scar. There was a 5 mm right parahilar nodule stable since 11/16/2017,  She began prednisone 10 mg on 12/07/2017.  She has diarrhea.  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  She received azithromycin x 3 days.  She received ciprofloxacin x 10 days without improvement.  She has seen ID in [place].  She began Bactrim on 03/04/2018.  She has renal insufficiency.  Creatinine has been followed:  1.17 on 11/05/2017, 1.85 on 11/09/2017, 1.38 on 11/12/2017, 1.74 on 11/15/2017, 1.66 on 11/17/2017, and 1.48 on 11/23/2017.  Urinalysis on 11/15/2017 revealed revealed no hemoglobin, bilirubin or active sediment.  Symptomatically, she has chronic fatigue.  Diarrhea continues.  Patient has infectious diarrheal infection (E.coli).  Exam is stable.  Hemoglobin is 11.5.  Plan: 1. Review labs from last visit. 2. Discuss ID consult.  Discuss initiation of Septra.  Patient  previously noted an allergic reaction with left facial swelling.  Watch for any signs of hemolysis. 3. Discuss current counts.  Hemoglobin improved.  Retic count remains high.  Suspect compensated hemolysis.  Patient remains on prednisone 10 mg a day.  Patient agreeable to [facility] referral.  Review issues with Rituxan and known hepatitis (reactivation) which would require antiviral therapy. 4. Labs today: CBC with diff, BMP. 5.   Continue prednisone 10 mg a day. 6.   Continue oral H36 and folic acid. 7.   RTC in 1 week for labs (CBC with diff, BMP). 8.   RTC in 3 weeks for MD assessment and labs (CBC with diff, [place], retic).   Lequita Asal, MD 03/04/2018, 3:46 PM

## 2018-03-04 NOTE — Assessment & Plan Note (Signed)
Will obtain stool culture and sensitivity today. Seems she has tolerated bactrim in the past for OI prophylaxis while on higher doses of prednisone. Will try extended 10 day course of bactrim 1 DS tab BID considering her chronic prednisone requirement. We also discussed rifaximin 200 mg TID should her bactrim be intolerable (daughter has GI reaction to this and sister "is allergic").  Imodium/Lomotil are not helpful - I advised to discontinue for now and can consider resuming if the antibiotic proves to be helpful. 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

## 2018-03-04 NOTE — Progress Notes (Signed)
Patient: Tina Page  DOB: 27-Apr-1956 MRN: [id number] PCP: Frazier Richards, MD  Referring Provider: Honor Loh, NP [facility] On[facility])    Patient Active Problem List   Diagnosis Date Noted  . 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 Hepatitis C antibody test positive 03/04/2018  . Enteritis, enteropathogenic E. coli 02/18/2018  . Aortic atherosclerosis ([place]) 12/13/2017  . Shortness of breath 12/13/2017  . 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 Pre-operative cardiovascular examination 11/23/2017  . Renal insufficiency 11/17/2017  . 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 Elevated liver function tests 11/03/2017  . Elevated uric acid in blood 11/03/2017  . Low serum cortisol level ([place]) 01/04/2017  . Elevated alkaline phosphatase level 10/25/2016  . Oral herpes simplex infection 10/25/2016  . Current chronic use of systemic steroids 10/23/2016  . Impetigo 10/23/2016  . Thrombocytopenia ([place]) 10/04/2016  . B12 deficiency 08/06/2016  . Symptomatic anemia 07/21/2016  . Atherosclerosis of native arteries of extremity with intermittent claudication ([place]) 07/20/2016  . PAD (peripheral artery disease) ([place]) 07/12/2016  . 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 Hepatitis B core antibody positive 07/12/2016  . Tobacco abuse 07/12/2016  . Leg pain 07/07/2016  . HTN (hypertension) 07/07/2016  . HLD (hyperlipidemia) 07/07/2016  . COPD (chronic obstructive pulmonary disease) ([place]) 07/07/2016  . Heme positive stool 01/29/2015     Subjective:  Tina Page is a 62 y.o. here today for evaluation of her ongoing diarrhea with stool PCR (+) enteropathogenic e coli infection as well as (+) Hep C Ab and (+) Hep B core Ab.   Tina Page has a past medical history detailed below but significant for RML mass, autoimmune hemolytic anemia (chronic  Prednisone use since 2017, now at 10 mg daily). She tells me that she has had diarrhea ongoing now for almost a month. Every time she eats it "goes right through her." Bowels move at least 3 times a day, often more. It does have mucus present but never blood. Consistency is described to be mushy to liquid. No cramps but does feel her bowels "gurgling". Was previously given 3 days of azithromycin --> no improvement in symptoms at all. Ciprofloxacin was then prescribed and after one dose she experienced facial/neck swelling on the left side and she stopped the med. Took a few days for the swelling to resolve. She has no weight loss, fevers or chills and tolerates foods without nausea or vomiting. She is nervous about starting bactrim as her daughter (whom is here today) and 2 sisters are allergic to this but she does not recall taking it. Currently on Prednisone 10 mg daily - this has been part of her regimen since 2017 at varying doses.   Hepatitis C labs and Hepatitis B labs positive. Her children have been tested and those that required vaccination have received it. She does not know where she acquired these infections in the past.   Review of Systems  Constitutional: Positive for malaise/fatigue. Negative for chills, diaphoresis, fever and weight loss.  HENT: Negative for sore throat.   Respiratory: Negative for cough and shortness of breath.   Cardiovascular: Negative for chest pain and leg swelling.  Gastrointestinal: Positive for diarrhea. Negative for abdominal pain and blood in stool.  Genitourinary: Negative for dysuria.  Musculoskeletal: Negative for myalgias.  Skin: Negative for rash.  Neurological: Negative for dizziness, weakness and headaches.  Past Medical History:  Diagnosis Date  . Anemia   . Atherosclerosis of native arteries of extremity with intermittent claudication ([place]) 07/20/2016  . COPD (chronic obstructive pulmonary disease) ([place])   . 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[place])  07/12/2016  . Elevated liver function tests 11/03/2017  . GERD (gastroesophageal reflux disease)   . Heme positive stool 01/29/2015  . Hepatitis C 07/12/2016  . HLD (hyperlipidemia)   . Hypertension   . Hypothyroidism   . 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 Thrombocytopenia ([place]) 10/04/2016    Outpatient Medications Prior to Visit  Medication Sig Dispense Refill  . albuterol (PROVENTIL HFA;VENTOLIN HFA) 108 (90 Base) MCG/ACT inhaler Inhale 2 puffs into the lungs every 6 (six) hours as needed for wheezing or shortness of breath.     . allopurinol (ZYLOPRIM) 100 MG tablet TAKE 1 TABLET BY MOUTH EVERY DAY 90 tablet 1  . clopidogrel (PLAVIX) 75 MG tablet Take 1 tablet (75 mg total) by mouth daily. 30 tablet 5  . cyanocobalamin 500 MCG tablet Take 500 mcg by mouth daily.    [name] Kitchen docusate sodium (COLACE) 100 MG capsule Take 1 capsule (100 mg total) by mouth 2 (two) times daily. (Patient taking differently: Take 100 mg by mouth daily. ) 10 capsule 0  . folic acid (FOLVITE) 1 MG tablet TAKE 1 TABLET (1 MG TOTAL) BY MOUTH DAILY. 90 tablet 1  . hydrochlorothiazide (HYDRODIURIL) 25 MG tablet Take 25 mg by mouth daily.    [name] Kitchen levothyroxine (SYNTHROID, LEVOTHROID) 75 MCG tablet     . lisinopril (PRINIVIL,ZESTRIL) 20 MG tablet Take 20 mg by mouth daily.     [name] Kitchen omeprazole (PRILOSEC) 20 MG capsule Take 1 capsule (20 mg total) by mouth daily. 60 capsule 0  . potassium chloride (KLOR-CON) 20 MEQ packet Take 20 mEq by mouth daily.    . pravastatin (PRAVACHOL) 40 MG tablet Take 40 mg by mouth every evening.    . predniSONE (DELTASONE) 20 MG tablet Take 0.5 tablets (10 mg total) by mouth daily. 30 tablet 1   No facility-administered medications prior to visit.      Allergies  Allergen Reactions  . Codeine Anaphylaxis  . Ciprofloxacin Swelling    Facial swelling following single oral dose.     Social History   Tobacco Use  . Smoking status: Former Smoker    Packs/day:  0.50    Types: Cigarettes    Last attempt to quit: 07/11/2016    Years since quitting: 1.6  . Smokeless tobacco: Never Used  Substance Use Topics  . Alcohol use: No  . Drug use: No    Family History  Problem Relation Age of Onset  . Diabetes Mother   . Hypertension Mother   . Diabetes Maternal Grandfather   . Hypertension Maternal Grandfather     Objective:   Vitals:   03/04/18 0855  BP: 132/79  Pulse: 86  Temp: 97.9 F (36.6 C)  TempSrc: Oral  Weight: 171 lb (77.6 kg)  Height: 5\' 3"  (1.6 m)   Body mass index is 30.29 kg/m.  Physical Exam  Constitutional: She is oriented to person, place, and time. She appears well-developed and well-nourished.  Seated comfortably in chair. Daughter is present today.   HENT:  Mouth/Throat: Mucous membranes are normal. No oral lesions. Normal dentition. No dental abscesses. No oropharyngeal exudate.  Cardiovascular: Normal rate, regular rhythm and normal heart sounds.  Pulmonary/Chest: Effort normal and breath sounds normal.  Abdominal: Soft.  She exhibits no distension. There is no tenderness.  Lymphadenopathy:    She has no cervical adenopathy.  Neurological: She is alert and oriented to person, place, and time.  Skin: Skin is warm and dry. No rash noted.  Psychiatric: She has a normal mood and affect. Judgment normal.  In good spirits today and engaged in care discussion  Nursing note and vitals reviewed.   Lab Results: Lab Results  Component Value Date   WBC 6.8 02/18/2018   HGB 9.0 (L) 02/18/2018   HGB 10.9 (L) 02/18/2018   HCT 32.6 (L) 02/18/2018   MCV 97.6 02/18/2018   PLT 159 02/18/2018    Lab Results  Component Value Date   CREATININE 1.24 (H) 02/18/2018   BUN 25 (H) 02/18/2018   NA 139 02/18/2018   K 4.3 02/18/2018   CL 108 02/18/2018   CO2 25 02/18/2018    Lab Results  Component Value Date   ALT 13 (L) 02/18/2018   AST 17 02/18/2018   ALKPHOS 67 02/18/2018   BILITOT 0.9 02/18/2018     Assessment &  Plan:   Problem List Items Addressed This Visit      Digestive   Enteritis, enteropathogenic E. coli - Primary    Will obtain stool culture and sensitivity today. Seems she has tolerated bactrim in the past for OI prophylaxis while on higher doses of prednisone. Will try extended 10 day course of bactrim 1 DS tab BID considering her chronic prednisone requirement. We also discussed rifaximin 200 mg TID should her bactrim be intolerable (daughter has GI reaction to this and sister "is allergic").  Imodium/Lomotil are not helpful - I advised to discontinue for now and can consider resuming if the antibiotic proves to be helpful. 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       Relevant Medications   sulfamethoxazole-trimethoprim (BACTRIM DS,SEPTRA DS) 800-160 MG tablet   Other Relevant Orders   Stool Culture     Other   Hepatitis C antibody test positive    Positive Hep C Ab and negative RNA. We discussed that ~25% of people clear this naturally and it appears she is one of them. There is nothing further needed for ongoing monitoring with this as she has clinical cure and no risk for Tina-activation.       Hepatitis B core antibody positive    She is HBsAg-negative and HBcAb positive.  There is a well known associated risk of reactivation of HBvirus with use of rituximab in the setting of lymphoma treatment; however he frequency of reactivation with autoimmune therapy is not quantified. That being said there is also a risk (presumed to be even smaller) of reactivation with chronic steroid with doses> 20 mg QD.   If rituximab is required I would recommend starting prophylactic Vemildy (preferred) or Entecavir with monitoring of HBV DNA and LFTs. At the minimum would recommend frequent lab monitoring on this therapy to monitor for acute hepatitis flare that would warrant treatment.  Will be happy to start this for her if her  Heme/Onc team would like to go forward.         Will see Ms. Welle back again in 3-4 weeks. She will notify our clinic if she has any trouble with the bactrim.   Janene Madeira, MSN, NP-C [facility] for Infectious [place] Pager: [phone] Office: [phone]  03/04/18  1:00 PM

## 2018-03-04 NOTE — Assessment & Plan Note (Addendum)
She is HBsAg-negative and HBcAb positive.  There is a well known associated risk of reactivation of HBvirus with use of rituximab in the setting of lymphoma treatment; however he frequency of reactivation with autoimmune therapy is not quantified. That being said there is also a risk (presumed to be even smaller) of reactivation with chronic steroid with doses> 20 mg QD.   If rituximab is required I would recommend starting prophylactic Vemildy (preferred) or Entecavir with monitoring of HBV DNA and LFTs. At the minimum would recommend frequent lab monitoring on this therapy to monitor for acute hepatitis flare that would warrant treatment.  Will be happy to start this for her if her Heme/Onc team would like to go forward.

## 2018-03-06 ENCOUNTER — Other Ambulatory Visit: Payer: Self-pay | Admitting: Hematology and Oncology

## 2018-03-06 DIAGNOSIS — D598 Other acquired hemolytic anemias: Secondary | ICD-10-CM

## 2018-03-08 LAB — STOOL CULTURE
MICRO NUMBER: 90799245
MICRO NUMBER: 90799246
MICRO NUMBER:: 90799244
SHIGA RESULT: NOT DETECTED
SPECIMEN QUALITY: ADEQUATE
SPECIMEN QUALITY: ADEQUATE
SPECIMEN QUALITY:: ADEQUATE

## 2018-03-08 LAB — TIQ-NTM

## 2018-03-09 ENCOUNTER — Telehealth: Payer: Self-pay | Admitting: Infectious Diseases

## 2018-03-09 NOTE — Telephone Encounter (Signed)
I called Ms. Gullo to see how she was feeling since starting the Bactrim for her diarrhea and how she was tolerating the antibiotic.   I left a VM asking her to call back on nurse triage line to provide [name] an update to make sure that all is going according to plan.   Janene Madeira, MSN, NP-C [facility] for Infectious [place] Office: [phone] Pager: [phone]  03/09/2018  10:52 AM

## 2018-03-11 ENCOUNTER — Inpatient Hospital Stay: Payer: BLUE CROSS/BLUE SHIELD

## 2018-03-11 ENCOUNTER — Telehealth: Payer: Self-pay

## 2018-03-11 DIAGNOSIS — D591 Autoimmune hemolytic anemia, unspecified: Secondary | ICD-10-CM

## 2018-03-11 DIAGNOSIS — K529 Noninfective gastroenteritis and colitis, unspecified: Secondary | ICD-10-CM

## 2018-03-11 LAB — CBC WITH DIFFERENTIAL/PLATELET
Basophils Absolute: 0.1 10*3/uL (ref 0–0.1)
Basophils Relative: 1 %
Eosinophils Absolute: 0.1 10*3/uL (ref 0–0.7)
Eosinophils Relative: 1 %
HCT: 32.9 % — ABNORMAL LOW (ref 35.0–47.0)
Hemoglobin: 10.9 g/dL — ABNORMAL LOW (ref 12.0–16.0)
Lymphocytes Relative: 9 %
Lymphs Abs: 0.6 10*3/uL — ABNORMAL LOW (ref 1.0–3.6)
MCH: 32.5 pg (ref 26.0–34.0)
MCHC: 33.3 g/dL (ref 32.0–36.0)
MCV: 97.8 fL (ref 80.0–100.0)
Monocytes Absolute: 0.2 10*3/uL (ref 0.2–0.9)
Monocytes Relative: 3 %
Neutro Abs: 6.1 10*3/uL (ref 1.4–6.5)
Neutrophils Relative %: 86 %
Platelets: 200 10*3/uL (ref 150–440)
RBC: 3.36 MIL/uL — ABNORMAL LOW (ref 3.80–5.20)
RDW: 14.7 % — ABNORMAL HIGH (ref 11.5–14.5)
WBC: 7.1 10*3/uL (ref 3.6–11.0)

## 2018-03-11 LAB — BASIC METABOLIC PANEL
Anion gap: 6 (ref 5–15)
BUN: 39 mg/dL — ABNORMAL HIGH (ref 6–20)
CO2: 20 mmol/L — ABNORMAL LOW (ref 22–32)
Calcium: 9.3 mg/dL (ref 8.9–10.3)
Chloride: 111 mmol/L (ref 98–111)
Creatinine, Ser: 2.07 mg/dL — ABNORMAL HIGH (ref 0.44–1.00)
GFR calc Af Amer: 29 mL/min — ABNORMAL LOW (ref >60)
GFR calc non Af Amer: 25 mL/min — ABNORMAL LOW (ref >60)
Glucose, Bld: 115 mg/dL — ABNORMAL HIGH (ref 70–99)
Potassium: 5.6 mmol/L — ABNORMAL HIGH (ref 3.5–5.1)
Sodium: 137 mmol/L (ref 135–145)

## 2018-03-11 NOTE — Telephone Encounter (Signed)
Left message on patient's voicemail per Colletta [place], NP stop bactrim due to abnormal lab  and come for repeat labs on Monday. Okay to repeat labs at cancer center if more convenient.   Patients creatine was elevated.    Laverle Patter, RN

## 2018-03-14 ENCOUNTER — Other Ambulatory Visit: Payer: Self-pay | Admitting: *Deleted

## 2018-03-14 ENCOUNTER — Inpatient Hospital Stay: Payer: BLUE CROSS/BLUE SHIELD

## 2018-03-14 DIAGNOSIS — D591 Autoimmune hemolytic anemia, unspecified: Secondary | ICD-10-CM

## 2018-03-14 LAB — COMPREHENSIVE METABOLIC PANEL
ALT: 12 U/L (ref 0–44)
ANION GAP: 7 (ref 5–15)
AST: 13 U/L — ABNORMAL LOW (ref 15–41)
Albumin: 4.3 g/dL (ref 3.5–5.0)
Alkaline Phosphatase: 60 U/L (ref 38–126)
BILIRUBIN TOTAL: 0.6 mg/dL (ref 0.3–1.2)
BUN: 37 mg/dL — ABNORMAL HIGH (ref 6–20)
CALCIUM: 9.2 mg/dL (ref 8.9–10.3)
CO2: 23 mmol/L (ref 22–32)
Chloride: 112 mmol/L — ABNORMAL HIGH (ref 98–111)
Creatinine, Ser: 1.55 mg/dL — ABNORMAL HIGH (ref 0.44–1.00)
GFR calc non Af Amer: 35 mL/min — ABNORMAL LOW (ref >60)
GFR, EST AFRICAN AMERICAN: 41 mL/min — AB (ref >60)
Glucose, Bld: 103 mg/dL — ABNORMAL HIGH (ref 70–99)
POTASSIUM: 4.1 mmol/L (ref 3.5–5.1)
Sodium: 142 mmol/L (ref 135–145)
TOTAL PROTEIN: 7.2 g/dL (ref 6.5–8.1)

## 2018-03-25 ENCOUNTER — Inpatient Hospital Stay: Payer: BLUE CROSS/BLUE SHIELD

## 2018-03-25 ENCOUNTER — Ambulatory Visit (INDEPENDENT_AMBULATORY_CARE_PROVIDER_SITE_OTHER): Payer: BLUE CROSS/BLUE SHIELD | Admitting: Infectious Diseases

## 2018-03-25 ENCOUNTER — Other Ambulatory Visit: Payer: Self-pay

## 2018-03-25 ENCOUNTER — Inpatient Hospital Stay (HOSPITAL_BASED_OUTPATIENT_CLINIC_OR_DEPARTMENT_OTHER): Payer: BLUE CROSS/BLUE SHIELD | Admitting: Hematology and Oncology

## 2018-03-25 ENCOUNTER — Encounter: Payer: Self-pay | Admitting: Infectious Diseases

## 2018-03-25 VITALS — BP 122/73 | HR 83 | Temp 97.8°F | Wt 172.0 lb

## 2018-03-25 VITALS — BP 101/65 | HR 91 | Temp 94.4°F | Resp 18 | Wt 173.1 lb

## 2018-03-25 DIAGNOSIS — A040 Enteropathogenic Escherichia coli infection: Secondary | ICD-10-CM

## 2018-03-25 DIAGNOSIS — D591 Autoimmune hemolytic anemia, unspecified: Secondary | ICD-10-CM

## 2018-03-25 DIAGNOSIS — N289 Disorder of kidney and ureter, unspecified: Secondary | ICD-10-CM | POA: Diagnosis not present

## 2018-03-25 DIAGNOSIS — E538 Deficiency of other specified B group vitamins: Secondary | ICD-10-CM

## 2018-03-25 DIAGNOSIS — R768 Other specified abnormal immunological findings in serum: Secondary | ICD-10-CM

## 2018-03-25 DIAGNOSIS — Z5181 Encounter for therapeutic drug level monitoring: Secondary | ICD-10-CM | POA: Diagnosis not present

## 2018-03-25 LAB — CBC WITH DIFFERENTIAL/PLATELET
Basophils Absolute: 0.1 10*3/uL (ref 0–0.1)
Basophils Relative: 1 %
Eosinophils Absolute: 0 10*3/uL (ref 0–0.7)
Eosinophils Relative: 1 %
HCT: 32.4 % — ABNORMAL LOW (ref 35.0–47.0)
Hemoglobin: 10.7 g/dL — ABNORMAL LOW (ref 12.0–16.0)
Lymphocytes Relative: 9 %
Lymphs Abs: 0.6 10*3/uL — ABNORMAL LOW (ref 1.0–3.6)
MCH: 32.6 pg (ref 26.0–34.0)
MCHC: 32.9 g/dL (ref 32.0–36.0)
MCV: 99 fL (ref 80.0–100.0)
Monocytes Absolute: 0.3 10*3/uL (ref 0.2–0.9)
Monocytes Relative: 4 %
Neutro Abs: 5.8 10*3/uL (ref 1.4–6.5)
Neutrophils Relative %: 85 %
Platelets: 149 10*3/uL — ABNORMAL LOW (ref 150–440)
RBC: 3.27 MIL/uL — ABNORMAL LOW (ref 3.80–5.20)
RDW: 15.7 % — ABNORMAL HIGH (ref 11.5–14.5)
WBC: 6.8 10*3/uL (ref 3.6–11.0)

## 2018-03-25 LAB — DAT, POLYSPECIFIC AHG (ARMC ONLY)
DAT, IgG: POSITIVE
DAT, complement: POSITIVE
Polyspecific AHG test: POSITIVE

## 2018-03-25 LAB — RETICULOCYTES
RBC.: 3.22 MIL/uL — ABNORMAL LOW (ref 3.80–5.20)
Retic Count, Absolute: 161 10*3/uL (ref 19.0–183.0)
Retic Ct Pct: 5 % — ABNORMAL HIGH (ref 0.4–3.1)

## 2018-03-25 NOTE — Assessment & Plan Note (Signed)
If rituximab is desired would initiate prophylactic Vemlidy or Entecavir with monitoring of HBV DNA/LFTs during treatment. Happy to help Tina Page with this if Heme/Onc team would like. May need to delay using this until we can get Tina Page enteritis under better control.

## 2018-03-25 NOTE — Progress Notes (Signed)
Here for follow up. Per pt saw Dr Doren Custard ( ID ) in [place] today.( second visit )  Still having multiple BM per day -( she follow up ) gave stool specimen today and taken off Bactrim after x 5 d only. Per pt energy ok . " Im ok " she stated.

## 2018-03-25 NOTE — Assessment & Plan Note (Addendum)
Patient with enterpathogenic e coli now failed azithromycin, intolerant to cipro and potentially failed trimethoprim-sulfamethoxazole; unfortunately had to stop early d/t AKI after 5 days. She did notice some improvement in frequency and urgency of diarrhea on treatment but now again with watery brown diarrhea, need for urgent defecation and abdominal bloating/gas. No fevers or chills or weight loss. Uncertain if this is ongoing infection vs post-infectious IBS symptoms.   This is usually a self-limiting infection however can be persistent in immunocompromised hosts. No signs of toxigenic strain based on description of symptoms. She is not systemically ill. Will re-check GI panel, repeat stool culture and obtain O&P today. 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 My next step would be to use Rifaximin 200 mg TID or Furazolidone 100 mg QID (needs G6PD lab draw prior to) if this is still persistent EPEC. Will call with results after studies return.

## 2018-03-25 NOTE — Progress Notes (Signed)
Patient: Tina Page  DOB: 23-Apr-1956 MRN: [id number] PCP: Frazier Richards, MD  Referring Provider: Honor Loh, NP [facility] On[facility])    Patient Active Problem List   Diagnosis Date Noted  . Medication monitoring encounter 03/25/2018  . 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 Hepatitis C antibody test positive 03/04/2018  . Enteritis, enteropathogenic E. coli 02/18/2018  . Aortic atherosclerosis ([place]) 12/13/2017  . Shortness of breath 12/13/2017  . 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 Renal insufficiency 11/17/2017  . 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 Elevated liver function tests 11/03/2017  . Elevated uric acid in blood 11/03/2017  . Low serum cortisol level ([place]) 01/04/2017  . Elevated alkaline phosphatase level 10/25/2016  . Oral herpes simplex infection 10/25/2016  . Current chronic use of systemic steroids 10/23/2016  . Impetigo 10/23/2016  . Thrombocytopenia ([place]) 10/04/2016  . B12 deficiency 08/06/2016  . Symptomatic anemia 07/21/2016  . Atherosclerosis of native arteries of extremity with intermittent claudication ([place]) 07/20/2016  . PAD (peripheral artery disease) ([place]) 07/12/2016  . 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 Hepatitis B core antibody positive 07/12/2016  . Tobacco abuse 07/12/2016  . Leg pain 07/07/2016  . HTN (hypertension) 07/07/2016  . HLD (hyperlipidemia) 07/07/2016  . COPD (chronic obstructive pulmonary disease) ([place]) 07/07/2016  . Heme positive stool 01/29/2015     Subjective:  Tina Page is a 62 y.o. here today for evaluation of her ongoing diarrhea with stool PCR (+) enteropathogenic e coli infection as well as (+) Hep C Ab and (+) Hep B core Ab. Trouble with ongoing diarrhea related to EPEC found on previous GI panel. Failed azithromycin x3d, intolerant of cipro and now s/p 5d  treatment from trimethoprim-sulfamethoxazole.   Reannah is here with her daughter and grandson. She did notice some improvement on her trimethoprim-sulfamethoxazole but unfortunately had an acute rise in her creatinine which required [name] to stop early (was hoping for 10 day course considering immunocompromised state). Since stopping this however she has resumed symptoms of abdominal bloating (which seems to be more than prior), stomach churning and watery brown diarrhea. She has associated urgency to defecate but no encopresis. No fevers, chills, night sweats, weight loss, or blood in the stool.   Review of Systems  Constitutional: Positive for malaise/fatigue. Negative for chills, diaphoresis, fever and weight loss.  HENT: Negative for sore throat.   Respiratory: Negative for cough and shortness of breath.   Cardiovascular: Negative for chest pain and leg swelling.  Gastrointestinal: Positive for abdominal pain (bloating and gas) and diarrhea. Negative for blood in stool.  Genitourinary: Negative for dysuria.  Musculoskeletal: Negative for myalgias.  Skin: Negative for rash.  Neurological: Negative for dizziness, weakness and headaches.    Past Medical History:  Diagnosis Date  . Anemia   . Atherosclerosis of native arteries of extremity with intermittent claudication ([place]) 07/20/2016  . COPD (chronic obstructive pulmonary disease) ([place])   . 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 Elevated liver function tests 11/03/2017  . GERD (gastroesophageal reflux disease)   . Heme positive stool 01/29/2015  . Hepatitis C 07/12/2016  . HLD (hyperlipidemia)   . Hypertension   . Hypothyroidism   . 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 Thrombocytopenia ([place]) 10/04/2016    Outpatient Medications Prior to Visit  Medication Sig  Dispense Refill  . albuterol (PROVENTIL HFA;VENTOLIN HFA) 108 (90 Base) MCG/ACT inhaler Inhale 2 puffs into the lungs every 6 (six) hours as  needed for wheezing or shortness of breath.     . allopurinol (ZYLOPRIM) 100 MG tablet TAKE 1 TABLET BY MOUTH EVERY DAY 90 tablet 1  . clopidogrel (PLAVIX) 75 MG tablet Take 1 tablet (75 mg total) by mouth daily. 30 tablet 5  . cyanocobalamin 500 MCG tablet Take 500 mcg by mouth daily.    [name] Kitchen docusate sodium (COLACE) 100 MG capsule Take 1 capsule (100 mg total) by mouth 2 (two) times daily. (Patient taking differently: Take 100 mg by mouth daily. ) 10 capsule 0  . folic acid (FOLVITE) 1 MG tablet TAKE 1 TABLET (1 MG TOTAL) BY MOUTH DAILY. 90 tablet 1  . hydrochlorothiazide (HYDRODIURIL) 25 MG tablet Take 25 mg by mouth daily.    [name] Kitchen levothyroxine (SYNTHROID, LEVOTHROID) 75 MCG tablet     . lisinopril (PRINIVIL,ZESTRIL) 20 MG tablet Take 20 mg by mouth daily.     [name] Kitchen omeprazole (PRILOSEC) 20 MG capsule Take 1 capsule (20 mg total) by mouth daily. 60 capsule 0  . potassium chloride (KLOR-CON) 20 MEQ packet Take 20 mEq by mouth daily.    . pravastatin (PRAVACHOL) 40 MG tablet Take 40 mg by mouth every evening.    . predniSONE (DELTASONE) 20 MG tablet Take 0.5 tablets (10 mg total) by mouth daily. 30 tablet 1   No facility-administered medications prior to visit.      Allergies  Allergen Reactions  . Codeine Anaphylaxis  . Ciprofloxacin Swelling    Facial swelling following single oral dose.     Social History   Tobacco Use  . Smoking status: Former Smoker    Packs/day: 0.50    Types: Cigarettes    Last attempt to quit: 07/11/2016    Years since quitting: 1.7  . Smokeless tobacco: Never Used  Substance Use Topics  . Alcohol use: No  . Drug use: No    Family History  Problem Relation Age of Onset  . Diabetes Mother   . Hypertension Mother   . Diabetes Maternal Grandfather   . Hypertension Maternal Grandfather     Objective:   Vitals:   03/25/18 0903  BP: 122/73  Pulse: 83  Temp: 97.8 F (36.6 C)  TempSrc: Oral  Weight: 172 lb (78 kg)   Body mass index is 30.47  kg/m.  Physical Exam  Constitutional: She is oriented to person, place, and time. She appears well-developed and well-nourished.  Seated comfortably in chair. Well-appearing. Daughter and grandson are present.   HENT:  Mouth/Throat: Mucous membranes are normal. No oral lesions. Normal dentition. No dental abscesses.  Cardiovascular: Normal rate.  Pulmonary/Chest: Effort normal. No respiratory distress.  Abdominal: She exhibits distension. There is no tenderness.  Neurological: She is alert and oriented to person, place, and time.  Skin: Skin is warm and dry. No rash noted.  Psychiatric: She has a normal mood and affect. Judgment normal.  Nursing note and vitals reviewed.  Lab Results: Lab Results  Component Value Date   WBC 7.1 03/11/2018   HGB 10.9 (L) 03/11/2018   HCT 32.9 (L) 03/11/2018   MCV 97.8 03/11/2018   PLT 200 03/11/2018    Lab Results  Component Value Date   CREATININE 1.55 (H) 03/14/2018   BUN 37 (H) 03/14/2018   NA 142 03/14/2018   K 4.1 03/14/2018   CL 112 (H) 03/14/2018   CO2  23 03/14/2018    Lab Results  Component Value Date   ALT 12 03/14/2018   AST 13 (L) 03/14/2018   ALKPHOS 60 03/14/2018   BILITOT 0.6 03/14/2018     Assessment & Plan:   Problem List Items Addressed This Visit      Digestive   Enteritis, enteropathogenic E. coli - Primary    Patient with enterpathogenic e coli now failed azithromycin, intolerant to cipro and potentially failed trimethoprim-sulfamethoxazole; unfortunately had to stop early d/t AKI after 5 days. She did notice some improvement in frequency and urgency of diarrhea on treatment but now again with watery brown diarrhea, need for urgent defecation and abdominal bloating/gas. No fevers or chills or weight loss. Uncertain if this is ongoing infection vs post-infectious IBS symptoms.   This is usually a self-limiting infection however can be persistent in immunocompromised hosts. No signs of toxigenic strain based on  description of symptoms. She is not systemically ill. Will re-check GI panel, repeat stool culture and obtain O&P today. 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 My next step would be to use Rifaximin 200 mg TID or Furazolidone 100 mg QID (needs G6PD lab draw prior to) if this is still persistent EPEC. Will call with results after studies return.       Relevant Orders   Gastrointestinal Pathogen Panel PCR   Stool Culture   Ova and parasite examination     Other   Medication monitoring encounter    Lab Results  Component Value Date   CREATININE 1.55 (H) 03/14/2018   CREATININE 2.07 (H) 03/11/2018   CREATININE 1.24 (H) 03/04/2018   Improved creatinine after stopping trimethoprim-sulfamethoxazole as expected. Discussed with Ms. Domingo Cocking today.       Hepatitis B core antibody positive    If rituximab is desired would initiate prophylactic Vemlidy or Entecavir with monitoring of HBV DNA/LFTs during treatment. Happy to help her with this if Heme/Onc team would like. May need to delay using this until we can get her enteritis under better control.          Janene Madeira, MSN, NP-C [facility] for Infectious [place] Pager: [phone] Office: [phone]  03/25/18  9:54 AM

## 2018-03-25 NOTE — Patient Instructions (Signed)
Will call with stool results today and decide next steps for treatment.

## 2018-03-25 NOTE — Assessment & Plan Note (Signed)
Lab Results  Component Value Date   CREATININE 1.55 (H) 03/14/2018   CREATININE 2.07 (H) 03/11/2018   CREATININE 1.24 (H) 03/04/2018   Improved creatinine after stopping trimethoprim-sulfamethoxazole as expected. Discussed with Ms. Tina Page today.

## 2018-03-25 NOTE — Progress Notes (Signed)
[facility] day: 03/25/2018   Chief Complaint: Tina Page is a 62 y.o. female with a right middle lobe mass, warm autoimmune hemolytic anemia, and renal insufficiency who is seen for 3 week assessment.  HPI:  The patient was last seen in the hematology clinic on 07/052019.  At that time, she noted chronic fatigue.  Diarrhea continued.  Patient had infectious diarrhea (E.coli).  Exam was stable.  Hemoglobin was 11.5.  She had seen ID who recommended Septra.  She started Septra on 03/04/2018.  She continued prednisone 10 mg a day.  Labs on 03/11/2018 revealed a creatinine of 2.07 (previously 1.24 on 03/04/2018).  She was told to stop her Bactrim.  Hematocrit was 32.9 with a hemoglobin of 10.9.  Creatinine was 1.55 on 03/14/2018.  LFTs were normal.  During the interim, patient continues to experience significant daily diarrhea. Patient has abdominal bloating. She is fatigued related to her known EPEC infection. Patient denies fevers. Patient was seen earlier today by Doren Custard, NP (infectious disease) in [place]. Notes reviewed. Stool studies were recollected to assess for continued infection vs. post-infectious IBS symptoms. Continued EPEC (+) diarrhea will necessitate treatment with Rifaximin 200 mg TID or Furazolidone 100 mg QID.  She continues to eat well. Her weight is up 1 pound. Patient denies pain in the clinic today.    Past Medical History:  Diagnosis Date  . Anemia   . Atherosclerosis of native arteries of extremity with intermittent claudication ([place]) 07/20/2016  . COPD (chronic obstructive pulmonary disease) ([place])   . 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 Elevated liver function tests 11/03/2017  . GERD (gastroesophageal reflux disease)   . Heme positive stool 01/29/2015  . Hepatitis C 07/12/2016  . HLD (hyperlipidemia)   . Hypertension   . Hypothyroidism   . 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 Post PTCA  07/12/2016  . Thrombocytopenia ([place]) 10/04/2016    Past Surgical History:  Procedure Laterality Date  . ELECTROMAGNETIC NAVIGATION BROCHOSCOPY N/A 12/07/2017   Procedure: ELECTROMAGNETIC NAVIGATION BRONCHOSCOPY;  Surgeon: Flora Lipps, MD;  Location: [facility] ORS;  Service: Cardiopulmonary;  Laterality: N/A;  . ESOPHAGOGASTRODUODENOSCOPY (EGD) WITH PROPOFOL N/A 07/08/2016   Procedure: ESOPHAGOGASTRODUODENOSCOPY (EGD) WITH PROPOFOL;  Surgeon: Manya Silvas, MD;  Location: [facility] ENDOSCOPY;  Service: Endoscopy;  Laterality: N/A;  . KNEE SURGERY Right    repair of acl tear  . PERIPHERAL VASCULAR CATHETERIZATION N/A 07/10/2016   Procedure: Lower Extremity Angiography;  Surgeon: Katha Cabal, MD;  Location: [place] [facility];  Service: Cardiovascular;  Laterality: N/A;  . PERIPHERAL VASCULAR CATHETERIZATION N/A 07/10/2016   Procedure: Abdominal Aortogram w/Lower Extremity;  Surgeon: Katha Cabal, MD;  Location: [place] [facility];  Service: Cardiovascular;  Laterality: N/A;  . PERIPHERAL VASCULAR CATHETERIZATION  07/10/2016   Procedure: Lower Extremity Intervention;  Surgeon: Katha Cabal, MD;  Location: [place] [facility];  Service: Cardiovascular;;    Family History  Problem Relation Age of Onset  . Diabetes Mother   . Hypertension Mother   . Diabetes Maternal Grandfather   . Hypertension Maternal Grandfather     Social History:  reports that she quit smoking about 20 months ago. Her smoking use included cigarettes. She smoked 0.50 packs per day. She has never used smokeless tobacco. She reports that she does not drink alcohol or use drugs.  She has a 21 pack year smoking history (1/2 pack/day from age [age]).  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  She is exposed to cold temperatures.  She works 3rd shift.  She has a daughter, [name] and a daughter named [name].  She is alone today.   Allergies:  Allergies  Allergen Reactions  . Codeine Anaphylaxis  .  Ciprofloxacin Swelling    Facial swelling following single oral dose.     Current Medications: Current Outpatient Medications  Medication Sig Dispense Refill  . allopurinol (ZYLOPRIM) 100 MG tablet TAKE 1 TABLET BY MOUTH EVERY DAY 90 tablet 1  . clopidogrel (PLAVIX) 75 MG tablet Take 1 tablet (75 mg total) by mouth daily. 30 tablet 5  . cyanocobalamin 500 MCG tablet Take 500 mcg by mouth daily.    [name] Kitchen docusate sodium (COLACE) 100 MG capsule Take 1 capsule (100 mg total) by mouth 2 (two) times daily. (Patient taking differently: Take 100 mg by mouth daily. ) 10 capsule 0  . folic acid (FOLVITE) 1 MG tablet TAKE 1 TABLET (1 MG TOTAL) BY MOUTH DAILY. 90 tablet 1  . hydrochlorothiazide (HYDRODIURIL) 25 MG tablet Take 25 mg by mouth daily.    [name] Kitchen levothyroxine (SYNTHROID, LEVOTHROID) 75 MCG tablet     . lisinopril (PRINIVIL,ZESTRIL) 20 MG tablet Take 20 mg by mouth daily.     [name] Kitchen omeprazole (PRILOSEC) 20 MG capsule Take 1 capsule (20 mg total) by mouth daily. 60 capsule 0  . potassium chloride (KLOR-CON) 20 MEQ packet Take 20 mEq by mouth daily.    . pravastatin (PRAVACHOL) 40 MG tablet Take 40 mg by mouth every evening.    . predniSONE (DELTASONE) 20 MG tablet Take 0.5 tablets (10 mg total) by mouth daily. 30 tablet 1  . albuterol (PROVENTIL HFA;VENTOLIN HFA) 108 (90 Base) MCG/ACT inhaler Inhale 2 puffs into the lungs every 6 (six) hours as needed for wheezing or shortness of breath.      No current facility-administered medications for this visit.     Review of Systems:  GENERAL: Chronic fatigue.  No fevers, sweats or weight loss.  Weight up 1 pound. PERFORMANCE STATUS (ECOG):  1 HEENT:  No visual changes, runny nose, sore throat, mouth sores or tenderness. Lungs: No shortness of breath or cough.  No hemoptysis. Cardiac:  No chest pain, palpitations, orthopnea, or PND. GI:  Diarrhea x 1 month (5 stools/day).  No nausea, vomiting, constipation, melena or hematochezia. GU:  No urgency, frequency,  dysuria, or hematuria. Musculoskeletal:  No back pain.  No joint pain.  No muscle tenderness. Extremities:  No pain or swelling. Skin:  No rashes or skin changes. Neuro:  No headache, numbness or weakness, balance or coordination issues. Endocrine:  No diabetes, thyroid issues, hot flashes or night sweats. Psych:  No mood changes, depression or anxiety. Pain:  No focal pain. Review of systems:  All other systems reviewed and found to be negative.   Physical Exam: Blood pressure 101/65, pulse 91, temperature (!) 94.4 F (34.7 C), temperature source Tympanic, resp. rate 18, weight 173 lb 1.6 oz (78.5 kg). GENERAL:  Well developed, well nourished, woman sitting comfortably in the exam room in no acute distress. MENTAL STATUS:  Alert and oriented to person, place and time. HEAD:  [name] hair.  Normocephalic, atraumatic, face symmetric, no Cushingoid features. EYES:  Blue eyes.  Pupils equal round and reactive to light and accomodation.  No conjunctivitis or scleral icterus. ENT:  Oropharynx clear without lesion.  Tongue normal. Mucous membranes moist.  RESPIRATORY:  Clear to auscultation without rales, wheezes or rhonchi. CARDIOVASCULAR:  Regular rate and rhythm without murmur, rub or gallop. ABDOMEN:  Soft, non-tender, with active bowel sounds, and no hepatosplenomegaly.  No masses. SKIN:  No rashes, ulcers or lesions. EXTREMITIES: Trace lower extremity edema.  No skin discoloration or tenderness.  No palpable cords. LYMPH NODES: No palpable cervical, supraclavicular, axillary or inguinal adenopathy  NEUROLOGICAL: Unremarkable. PSYCH:  Appropriate.    Appointment on 03/25/2018  Component Date Value Ref Range Status  . Retic Ct Pct 03/25/2018 5.0* 0.4 - 3.1 % Final  . RBC. 03/25/2018 3.22* 3.80 - 5.20 MIL/uL Final  . Retic Count, Absolute 03/25/2018 161.0  19.0 - 183.0 K/uL Final   Performed at [facility], [address] [place], [place] [address]  . WBC 03/25/2018 6.8  3.6 -  11.0 K/uL Final  . RBC 03/25/2018 3.27* 3.80 - 5.20 MIL/uL Final  . Hemoglobin 03/25/2018 10.7* 12.0 - 16.0 g/dL Final  . HCT 03/25/2018 32.4* 35.0 - 47.0 % Final  . MCV 03/25/2018 99.0  80.0 - 100.0 fL Final  . MCH 03/25/2018 32.6  26.0 - 34.0 pg Final  . MCHC 03/25/2018 32.9  32.0 - 36.0 g/dL Final  . RDW 03/25/2018 15.7* 11.5 - 14.5 % Final  . Platelets 03/25/2018 149* 150 - 440 K/uL Final  . Neutrophils Relative % 03/25/2018 85  % Final  . Neutro Abs 03/25/2018 5.8  1.4 - 6.5 K/uL Final  . Lymphocytes Relative 03/25/2018 9  % Final  . Lymphs Abs 03/25/2018 0.6* 1.0 - 3.6 K/uL Final  . Monocytes Relative 03/25/2018 4  % Final  . Monocytes Absolute 03/25/2018 0.3  0.2 - 0.9 K/uL Final  . Eosinophils Relative 03/25/2018 1  % Final  . Eosinophils Absolute 03/25/2018 0.0  0 - 0.7 K/uL Final  . Basophils Relative 03/25/2018 1  % Final  . 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 One week prior to presentation, she felt like she was coming down with something. She denied any fever, runny nose, sore throat or cough. She had some diarrhea. She denied any new medications or herbal products.  Anemia workup revealed a cold autoantibody (IgG and complement).  Reticulocyte count was 11.3%.  Ferritin was 562.  Iron studies revealed a saturation of 20% and a TIBC of 214 (low).  B12 was 231 (low normal).  Folate was 42.   Peripheral smear revealed rouleaux formation.  Labs on 02/18/2018 revealed normal flow cytometry, SPEP, and immunoglobulins.  Additional testing included the following + studies:  hepatitis C antibody, hepatitis B core antibody, CMV IgM, and EBV VCA (IgM and IgG).  Hepatitis B by PCR was negative.  Hepatitis C RNA was negative.   Mycoplasma pneumonia IgM was negative.  Reticulocyte count was 11.3% (high) indicating appropriate marrow response.  C3 and C4 were normal.  Cold agglutinin titer was negative x 2.  Negative studies included:  ANA, hepatitis B surface antigen, SPEP, and free light chain ratio.   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  MMA was initially 330 (normal).  Repeat MMA was elevated on 08/01/2016 confirming B12 deficiency.  Hepatitis B surface antibody was positive and hepatitis B surface antigen was negative on 08/06/2016.  Chest, abdomen, and pelvis CT  angiogram on 07/07/2016 revealed moderate diffuse atherosclerotic vascular disease of the abdominal aorta with severe stenosis of the left common iliac artery with suspected short segment occlusion. There was no adenopathy. Spleen was normal.  Abdominal ultrasound on 04/15/2017 revealed a normal spleen and sludge in the gallbladder.  The liver was echogenic consistent fatty infiltration and/or hepatocellular disease.  She underwent PTCA and stent placement in right and left common iliac arteries and left external iliac artery on 07/10/2016.  She is on Plavix.  EGD on 03/19/2015 revealed gastritis in the body and antrum. Colonoscopy on 03/19/2015 revealed one hyperplastic polyp. EGD on 07/08/2016 was normal.  No evidence of bleeding.  She has received 6 units of warmed PRBCs to date (last on 08/01/2016). If Rituxan is needed, she requires entecavir 0.5 mg q day beginning 2 weeks prior to Rituxan.  In addition, hepatitis B viral level and LFTs should be checked monthly.  She began steroids (1 mg/kg) on 08/01/2016.  She stopped prednisone on 03/12/2017.  She is on folic acid 1 mg a day.  She received B12 1000 mcg IM on 08/06/2016.  She is on oral B12.  B12 and folate were normal on 03/09/2017 and 10/12/2017.  She developed peri-oral and intranasal herpes simplex-1.  She was treated with valacyclovir and doxycycline on  10/19/2016.  She developed a transient elevated alkaline phosphatase on 10/19/2016 which subsequently normalized.  She was documented to have a warm autoantibody on 10/12/2017.  LDH and bilirubin were normal.  Hematocrit had improved from prior testing.    She developed flu like symptoms on 10/26/2017.  Symptoms included cough, myalgias, and fever (tmax unknown).  She was prescribed Mucinex, Tamiflu, and amoxicillin.  She only took amoxicillin x 5 days.  She developed increased liver function tests on 11/02/2017. CMV IgG was positive.  EBV VCA IgG, NA IgG, early antigen antibody IgG were elevated.  EBV VCA IgM was < 36.  Testing was c/w a convalescence/past infection or reactivated infection.  LFTs normalized on 11/15/2017.  Smooth muscle antibody was 39 (high) on 11/10/2017 and 34 (high) on 11/30/2017.   Abdomen and pelvic CT on 11/10/2017 revealed a 2.2 cm spiculated density in right middle lobe concerning  for malignancy.   Chest CT on 11/16/2017 revealed a 2.2 x 2.0 x 2.4 spiculated RIGHT middle lobe mass.  There was tiny nonspecific upper lobe pulmonary nodules greater on RIGHT, largest 3 mm, of uncertain etiology.  There was additional 8 x 8 mm opacity in the posterior sulcus of the RIGHT lower lobe.  PET scan on 11/22/2017 revealed a 2 cm hypermetabolic right middle lobe lung mass (SUV 3.4) consistent  with primary lung neoplasm.  There were no findings for mediastinal/hilar lymphadenopathy or metastatic disease.  There were areas of hypermetabolism involving the left oblique abdominal muscles and the anorectal junction.  PFTs on 11/18/2017 revealed an FEV1 of 1.22 liters (51%).  DLCO adj was 6.8 mL/mmHg/min (32%).  ENB done on 12/07/2017. Cytology was negative for malignancy. Pathology demonstrated organizing pneumonia and chronic bronchitis. Pathologist commented that organizing pneumonia spanned about 2 mm in one fragment. Cases of focal organizing pneumonia with hypermetabolism on FDG-PET have  been described, but changes of this type can also be adjacent to a neoplasm. Patient prescribed a daily dose of Prednisone 10 mg, with re-imaging planned in 6-8 weeks.   Chest CT on 02/06/2018 revealed the spiculated 2.2 cm right middle lobe lesion was almost completely resolved with only a residual 4 mm irregular nodule identified at  this location on a background of architectural distortion/evolving scar. There was a 5 mm right parahilar nodule stable since 11/16/2017,  She began prednisone 10 mg on 12/07/2017.  She has diarrhea.  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  She received azithromycin x 3 days.  She received ciprofloxacin x 10 days without improvement.  She has seen ID in [place].  She began Bactrim on 03/04/2018 (discontinued on 03/11/2018) secondary to increase in creatine.  She has renal insufficiency.  Creatinine has been followed:  1.17 on 11/05/2017, 1.85 on 11/09/2017, 1.38 on 11/12/2017, 1.74 on 11/15/2017, 1.66 on 11/17/2017, and 1.48 on 11/23/2017.  Urinalysis on 11/15/2017 revealed revealed no hemoglobin, bilirubin or active sediment.  Symptomatically, she has chronic fatigue.  She has had diarrhea x 1 month.  Patient has infectious diarrheal infection (EPEC). She is followed by ID in [place].  Exam is stable.    Plan: 1. Labs today:  CBC with diff, [place], retic. 2. Enteropathogenic E coli (EPEC) diarrhea:  Discuss Septra induced renal insufficiency.  Patient following with ID. 3. Hemolytic anemia:  Patient on low dose prednisone.  Hemoglobin stable.  Discuss consideration of Rituxan, but concern for hepatitis B + serologies.  Discuss need for referral to [facility] for further evaluation.  Patient agrees. 4. B12 deficiency:  Continue oral B12 5. RTC based on [facility] referral - will need to call for an appointment.    Honor Loh, NP 03/25/2018, 3:28 PM   I saw and evaluated the patient, participating in the key portions of the service  and reviewing pertinent diagnostic studies and records.  I reviewed the nurse practitioner's note and agree with the findings and the plan.  The assessment and plan were discussed with the patient.  Several questions were asked by the patient and answered.   Nolon Stalls, MD 03/25/2018,3:28 PM

## 2018-03-30 ENCOUNTER — Encounter (INDEPENDENT_AMBULATORY_CARE_PROVIDER_SITE_OTHER): Payer: Self-pay

## 2018-03-30 ENCOUNTER — Telehealth: Payer: Self-pay | Admitting: Infectious Diseases

## 2018-03-30 LAB — OVA AND PARASITE EXAMINATION
CONCENTRATE RESULT:: NONE SEEN
MICRO NUMBER:: 90886667
SPECIMEN QUALITY:: ADEQUATE
TRICHROME RESULT:: NONE SEEN

## 2018-03-30 LAB — STOOL CULTURE
MICRO NUMBER: 90886665
MICRO NUMBER: 90886666
MICRO NUMBER:: 90886668
SHIGA RESULT: NOT DETECTED
SPECIMEN QUALITY: ADEQUATE
SPECIMEN QUALITY:: ADEQUATE
SPECIMEN QUALITY:: ADEQUATE

## 2018-03-30 LAB — OTHER LAB TEST: PRICE:: 0

## 2018-03-30 LAB — GASTROINTESTINAL PATHOGEN PANEL PCR

## 2018-03-30 MED ORDER — VIBERZI 75 MG PO TABS: 75.0000 mg | ORAL_TABLET | Freq: Two times a day (BID) | ORAL | 0 refills | Status: DC

## 2018-03-30 NOTE — Telephone Encounter (Signed)
GI panel unable to run and suggested recollection - these PCR tests are much more sensitive however and sometimes make it difficult to interpret colonization/active infection. Considering her stool culture showed no growth I discussed with Tina Page that this is less likely ongoing infection and more post-infectious IBS symptoms with diarrhea predominating symptoms.   Discussed trial of Viberzi BID vs Rifaximin 550 mg TID. There is not much data regarding which is more likely to resolve/reduce symptoms but will be a trial and error. Encouraged her to adhering to a low FODMAP diet reducing fructose, fermented fruits/products, honey, lactose containing products and corn-syrup as well as artificial sweetners as these can worsen symptoms and increase gas production. She drinks a lot of mountain dew which is likely exacerbating things.   I will give her information for 90-day supply coupon to use during trial period. Difficult to know how long her symptoms will last but in some it can last over a year. Will see her back as needed for now.   Tina Madeira, MSN, NP-C [facility] [place] [facility] Office: [phone] Pager: [phone]  03/30/2018  3:57 PM

## 2018-04-01 ENCOUNTER — Telehealth: Payer: Self-pay | Admitting: *Deleted

## 2018-04-01 NOTE — Telephone Encounter (Signed)
  I will call again today.  Please let her know.  M

## 2018-04-01 NOTE — Telephone Encounter (Signed)
Patient informed of doctor response 

## 2018-04-01 NOTE — Telephone Encounter (Signed)
Patient called and reports she has not heard anything form [facility] where she was to be referred to by Dr Mike Gip. She requests we check on this for her. Please advise

## 2018-04-03 ENCOUNTER — Other Ambulatory Visit: Payer: Self-pay | Admitting: Internal Medicine

## 2018-04-07 ENCOUNTER — Other Ambulatory Visit: Payer: Self-pay | Admitting: *Deleted

## 2018-04-07 MED ORDER — PREDNISONE 20 MG PO TABS: 10.0000 mg | ORAL_TABLET | Freq: Every day | ORAL | 1 refills | Status: DC

## 2018-04-07 NOTE — Telephone Encounter (Signed)
Patient requesting refill of medicine that Dr Mortimer Fries started her on, Prednisone

## 2018-04-08 ENCOUNTER — Telehealth: Payer: Self-pay | Admitting: *Deleted

## 2018-04-08 NOTE — Telephone Encounter (Signed)
Called patient and LVM that Dr. Mike Gip has sent referral to [facility] to hematology.  She can expect a call from them for an appointment.

## 2018-04-14 ENCOUNTER — Encounter
Admit: 2018-04-14 | Discharge: 2018-04-14 | Payer: PRIVATE HEALTH INSURANCE | Attending: Hematology & Oncology | Primary: Hematology & Oncology

## 2018-04-14 ENCOUNTER — Other Ambulatory Visit: Payer: BLUE CROSS/BLUE SHIELD

## 2018-04-14 DIAGNOSIS — D591 Other autoimmune hemolytic anemias: Principal | ICD-10-CM

## 2018-04-14 DIAGNOSIS — R768 Other specified abnormal immunological findings in serum: Secondary | ICD-10-CM

## 2018-04-14 LAB — MEAN CORPUSCULAR HEMOGLOBIN: Lab: 30.2

## 2018-04-14 LAB — CBC W/ AUTO DIFF
BASOPHILS ABSOLUTE COUNT: 0 10*9/L (ref 0.0–0.1)
EOSINOPHILS ABSOLUTE COUNT: 0.1 10*9/L (ref 0.0–0.4)
EOSINOPHILS RELATIVE PERCENT: 1.9 %
HEMATOCRIT: 32.1 % — ABNORMAL LOW (ref 36.0–46.0)
HEMOGLOBIN: 10 g/dL — ABNORMAL LOW (ref 12.0–16.0)
LARGE UNSTAINED CELLS: 1 % (ref 0–4)
LYMPHOCYTES ABSOLUTE COUNT: 1 10*9/L — ABNORMAL LOW (ref 1.5–5.0)
LYMPHOCYTES RELATIVE PERCENT: 24.4 %
MEAN CORPUSCULAR HEMOGLOBIN CONC: 31.1 g/dL (ref 31.0–37.0)
MEAN CORPUSCULAR HEMOGLOBIN: 30.2 pg (ref 26.0–34.0)
MEAN PLATELET VOLUME: 8.4 fL (ref 7.0–10.0)
MONOCYTES ABSOLUTE COUNT: 0.2 10*9/L (ref 0.2–0.8)
MONOCYTES RELATIVE PERCENT: 4.8 %
NEUTROPHILS ABSOLUTE COUNT: 2.6 10*9/L (ref 2.0–7.5)
NEUTROPHILS RELATIVE PERCENT: 66.6 %
PLATELET COUNT: 139 10*9/L — ABNORMAL LOW (ref 150–440)
RED BLOOD CELL COUNT: 3.3 10*12/L — ABNORMAL LOW (ref 4.00–5.20)
RED CELL DISTRIBUTION WIDTH: 14.7 % (ref 12.0–15.0)
WBC ADJUSTED: 3.9 10*9/L — ABNORMAL LOW (ref 4.5–11.0)

## 2018-04-14 LAB — COMPREHENSIVE METABOLIC PANEL
ALBUMIN: 4 g/dL (ref 3.5–5.0)
ALKALINE PHOSPHATASE: 60 U/L (ref 38–126)
ALT (SGPT): 13 U/L (ref 13–69)
ANION GAP: 8 mmol/L — ABNORMAL LOW (ref 9–15)
BILIRUBIN TOTAL: 0.5 mg/dL (ref 0.0–1.2)
BLOOD UREA NITROGEN: 25 mg/dL — ABNORMAL HIGH (ref 7–21)
BUN / CREAT RATIO: 20
CALCIUM: 9.2 mg/dL (ref 8.5–10.2)
CHLORIDE: 104 mmol/L (ref 98–107)
CO2: 29 mmol/L (ref 22.0–30.0)
CREATININE: 1.23 mg/dL — ABNORMAL HIGH (ref 0.60–1.00)
EGFR CKD-EPI AA FEMALE: 55 mL/min/{1.73_m2} — ABNORMAL LOW (ref >=60)
EGFR CKD-EPI NON-AA FEMALE: 48 mL/min/{1.73_m2} — ABNORMAL LOW (ref >=60)
GLUCOSE RANDOM: 88 mg/dL (ref 65–179)
POTASSIUM: 4 mmol/L (ref 3.5–5.0)
PROTEIN TOTAL: 6.7 g/dL (ref 6.5–8.3)
SODIUM: 141 mmol/L (ref 135–145)

## 2018-04-14 LAB — NITRITE UA: Lab: NEGATIVE

## 2018-04-14 LAB — URINALYSIS
BACTERIA: NONE SEEN /HPF
BLOOD UA: NEGATIVE
KETONES UA: NEGATIVE
NITRITE UA: NEGATIVE
PH UA: 5 (ref 5.0–9.0)
PROTEIN UA: NEGATIVE
RBC UA: <1 /HPF (ref <=4)
SPECIFIC GRAVITY UA: 1.015 (ref 1.003–1.030)
UROBILINOGEN UA: 0.2
WBC UA: <1 /HPF (ref 0–5)

## 2018-04-14 LAB — HEPATITIS B SURFACE ANTIGEN: Hepatitis B virus surface Ag:PrThr:Pt:Ser:Ord:: NONREACTIVE

## 2018-04-14 LAB — HEPATITIS B CORE TOTAL ANTIBODY: Hepatitis B virus core Ab:PrThr:Pt:Ser/Plas:Ord:IA: REACTIVE — AB

## 2018-04-14 LAB — HEPATITIS C ANTIBODY: Hepatitis C virus Ab:PrThr:Pt:Ser:Ord:: REACTIVE — AB

## 2018-04-14 LAB — EGFR CKD-EPI NON-AA FEMALE: Lab: 48 — ABNORMAL LOW

## 2018-04-14 LAB — HAPTOGLOBIN: Haptoglobin:MCnc:Pt:Ser/Plas:Qn:: 27 — ABNORMAL LOW

## 2018-04-14 LAB — LACTATE DEHYDROGENASE: Lactate dehydrogenase:CCnc:Pt:Ser/Plas:Qn:: 612 — ABNORMAL HIGH

## 2018-04-14 LAB — SMEAR - MD REQUEST

## 2018-04-14 LAB — HEPATITIS B SURFACE ANTIBODY: HEPATITIS B SURFACE ANTIBODY QUANT: 25.75 m[IU]/mL — ABNORMAL HIGH (ref <8.00)

## 2018-04-14 LAB — RETIC HGB CONTENT: Lab: 30.5

## 2018-04-14 LAB — RETICULOCYTES
RETIC HGB CONTENT: 30.5 pg (ref 29.7–36.1)
RETICULOCYTE ABSOLUTE COUNT: 101.4 10*9/L (ref 27.0–120.0)

## 2018-04-14 LAB — HEPATITIS B SURFACE ANTIBODY QUANT: Hepatitis B virus surface Ab:ACnc:Pt:Ser:Qn:: 25.75 — ABNORMAL HIGH

## 2018-04-14 MED ORDER — MYCOPHENOLATE MOFETIL 250 MG CAPSULE
ORAL_CAPSULE | Freq: Two times a day (BID) | ORAL | 0 refills | 0 days | Status: CP
Start: 2018-04-14 — End: 2018-04-15

## 2018-04-14 NOTE — Unmapped (Signed)
Clinical Pharmacist Practitioner: [facility]     Meagan Harris is a 62 y.o. female with AIHA who I am seeing today for home medication reconciliation.    Changes to medication profile:  1. Aspirin discontinued  2. Per patient, prednisone on and off since 2017 (side effect: 40# wt gain, irritability, swollen face), on prednisone winter 2018-19, stopped feb, mar 2019, resumed in April 2019, has been on prednisone 10 mg once daily since 19th April, however self tapered on Sunday 8/11 when running out of meds (did not take on mon 8/12, wd 8/14, has one dose left for sat 8/17)    Medication access  1. MAP specialist contact information was provided to patient.  2. Will start benefit investigation on mycophenolate.    Prescriptions:  -prednisone 10 mg #2 (4 days supply, 0 refills-continue to taper for another 6 days, last dse wed 04/20/18)  -Prescriptions sent electronically to patient's preferred pharmacy [facility]    Meagan Harris verbalized understanding of the education provided and had no further questions.     ___________________________________________________________________       Approximate face time spent with patient: 20 minutes     Audie Box, PharmD, BCOP, CPP  Clinical Pharmacist Practitioner, [facility]

## 2018-04-14 NOTE — Unmapped (Signed)
I would like you to be seen by Infectious Disease about safety of Rituxan before we make a decision about its use

## 2018-04-14 NOTE — Unmapped (Signed)
ERROR

## 2018-04-15 LAB — HEPATITIS C RNA, QUANTITATIVE, PCR

## 2018-04-15 LAB — HCV RNA: Hepatitis C virus RNA:PrThr:Pt:Ser/Plas:Ord:Probe.amp.tar: NOT DETECTED

## 2018-04-18 MED ORDER — PREDNISONE 10 MG TABLET
ORAL_TABLET | ORAL | 0 refills | 0 days | Status: CP
Start: 2018-04-18 — End: 2018-05-12

## 2018-04-18 NOTE — Unmapped (Signed)
Hematology Consult Note    Referring Physician :  Nadine Counts*    Primary Care Physician:   Beverely Low MD [facility])    Reason for Consult:  Autoimmune hemolytic anemia    Assessment/Recommendations:   Meagan Harris is a 62 y.o. year old Caucasian female who we are seeing in consultation at the request of Dr. 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    1.  Warm antibody immune hemolysis: Coombs positive,previously thought to be a mixed warm/cold phenotype at [place], but cold agg titers were negative. Chronically steroid dependent and wishes to come off steroids which seems appropriate.  The evidence is best for Rituxan as the immunosuppressive adjunct of choice. However, she has been hepatitis B core antibody negative. Has not received any other immunosuppressive therapy that I can discern.    2. Several other co-morbidities listed below, which would make splenectomy more challenging if needed.      Plans:  1.  Refer to ID for suitability/plan for Rituximab therapy:  Would also appreciate any recs on infection prophylaxis given long steroid history.  2. Continue current steroid therapy taper.Despite a modest anemia,she does not have any evidence of hemolysis on today's labs.   3. Vaccinate today -- HIB, Pneumo 13 (received 23 in 2017 per records)  4. RTC after ID visit to consider Rituxan initiation. If this is not possible, I would opt for Mycophenolate    History of Present Illness:   Meagan Harris is a 62 y.o. year old Caucasian female who we are seeing in consultation at the request of Dr. 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    The patient has a complicated PMH that began in 11/17 when she presented with Hb of 7.0 at [facility].Recieved3 units of blood. 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 Was then re-admitted with Hb 5.2 later in November 2017 -- received 2nd transfusion. Third admission for Hb 6.1 in 12/17,again requiring blood transfusion.  She has been off and on steroids for a diagnosis of AIHA -- usually responds well in the short term. Most recent relapse in Nov 2018. It is not clear to me whether the patient really has a cold antibody component from my review of the chart.     Currently, she is on 10 mg of Prednisone and is anxious to come off if possible.     She was recently found to have an abnormal chest CT with a concern for a RML mass. Further evaluation including biopsy revealed organizing pneumonia. Most recent CT in 6/19 showed resolution    Eneteropathogenic E Coli causing diarrhea inJune 2019. Treated with Azithromycin then Bactrim.     Medical History:  Patient Active Problem List   Diagnosis   ??? AIHA (autoimmune hemolytic anemia) (CMS-HCC)   ??? COPD (chronic obstructive pulmonary disease) (CMS-HCC)   ??? Mixed hyperlipidemia   ??? Hepatitis C antibody positive in blood   ??? Essential hypertension   ??? Acquired hypothyroidism   ??? 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 Gout   ?Discoid lupus    Patient has a history of jaundice when in [facility], but does not know why. Denies sharing needles.     Surgical History:  PTCA and stent placement bilateral common iliac arteries and left EIA in 11/17    Medications:   Current Outpatient Medications   Medication Sig Dispense Refill   ??? albuterol HFA 90 mcg/actuation inhaler Inhale 2 puffs.     ???  allopurinol (ZYLOPRIM) 100 MG tablet Take 100 mg by mouth daily.     ??? clopidogrel (PLAVIX) 75 mg tablet Take 75 mg by mouth daily.     ??? cyanocobalamin 500 MCG tablet Take 500 mcg by mouth daily.     ??? docusate sodium (COLACE) 100 MG capsule Take 100 mg by mouth daily.      ??? eluxadoline (VIBERZI) 75 mg Tab Take 75 mg by mouth Two (2) times a day.     ??? folic acid (FOLVITE) 1 MG tablet Take 1 mg by mouth daily.     ??? hydroCHLOROthiazide (HYDRODIURIL) 25 MG tablet Take 25 mg by mouth daily.     ??? levothyroxine (SYNTHROID, LEVOTHROID) 75 MCG tablet Take 75 mcg by mouth daily.     ??? lisinopril (PRINIVIL,ZESTRIL) 20 MG tablet Take 20 mg by mouth daily.     ??? omeprazole (PRILOSEC) 20 MG capsule Take 20 mg by mouth daily.     ??? potassium chloride (KLOR-CON) 20 mEq packet Take 20 mEq by mouth daily.     ??? pravastatin (PRAVACHOL) 40 MG tablet Take 40 mg by mouth daily.     ??? [START ON 04/18/2018] predniSONE (DELTASONE) 10 MG tablet Take 1 tablet (10 mg total) by mouth every other day. for 2 doses Take on Monday 8/19, and Wednesday 8/21, then stop 2 tablet 0     No current facility-administered medications for this visit.          Allergies:  Ciprofloxacin and Codeine      Social History     Socioeconomic History   ??? Marital status: None     Spouse name: None   ??? Number of children: None   ??? Years of education: None   ??? Highest education level: None   Occupational History   ??? None   Social Needs   ??? Financial resource strain: None   ??? Food insecurity:     Worry: None     Inability: None   ??? Transportation needs:     Medical: None     Non-medical: None   Tobacco Use   ??? Smoking status: Former Smoker     Packs/day: 1.00     Years: 45.00     Pack years: 45.00     Types: Cigarettes     Last attempt to quit: 07/07/2016     Years since quitting: 1.7   ??? Smokeless tobacco: Never Used   Substance and Sexual Activity   ??? Alcohol use: Yes     Comment: occasional    ??? Drug use: Not Currently     Types: Marijuana     Comment: as teenager   ??? Sexual activity: None   Lifestyle   ??? Physical activity:     Days per week: None     Minutes per session: None   ??? Stress: None   Relationships   ??? Social connections:     Talks on phone: None     Gets together: None     Attends religious service: None     Active member of club or organization: None     Attends meetings of clubs or organizations: None     Relationship status: None   Other Topics Concern   ??? None   Social History Narrative   ??? None     Accompanied by daughter today    Family History:  family history is not on file.   has no family status information on file.  Diabetes and hypertension in several relatives    Husband died of cirrhosis    Review of Systems:  As per HPI, otherwise negative x 10 systems.    Objective :  Vitals:    04/14/18 0811   BP: 134/70   Pulse: 82   Resp: 20   Temp: 37 ??C (98.6 ??F)   SpO2: 99%   Weight: 78.9 kg (174 lb)   Height: 157 cm (5' 1.81)       Physical Exam   Constitutional: She is oriented to person, place, and time.   Cushingoid appearance  Looks older than stated age   HENT:   Head: Normocephalic and atraumatic.   Eyes: Pupils are equal, round, and reactive to light. No scleral icterus.   Neck: Normal range of motion. No JVD present. No thyromegaly present.   Cardiovascular: Normal rate, regular rhythm and normal heart sounds.   No murmur heard.  Pulmonary/Chest: Effort normal and breath sounds normal. She has no wheezes. She has no rales.   Abdominal: Soft. She exhibits no mass. There is no tenderness.   Musculoskeletal: Normal range of motion. She exhibits no edema.   Lymphadenopathy:     She has no cervical adenopathy.   Neurological: She is alert and oriented to person, place, and time.   Psychiatric: She has a normal mood and affect. Her behavior is normal.   Nursing note and vitals reviewed.      Test Results  Results for orders placed or performed in visit on 04/14/18   Urinalysis   Result Value Ref Range    Color, UA Yellow     Clarity, UA Clear     Specific Gravity, UA 1.015 1.003 - 1.030    pH, UA 5.0 5.0 - 9.0    Leukocyte Esterase, UA Negative Negative    Nitrite, UA Negative Negative    Protein, UA Negative Negative    Glucose, UA Negative Negative    Ketones, UA Negative Negative    Urobilinogen, UA 0.2 mg/dL 0.2 mg/dL, 1.0 mg/dL    Bilirubin, UA Negative Negative    Blood, UA Negative Negative    RBC, UA <1 <=4 /HPF    WBC, UA <1 0 - 5 /HPF    Bacteria, UA None Seen None Seen /HPF   SMEAR - MD REQUEST   Result Value Ref Range    Smear - MD Request Slide Prepared for MD Review    Reticulocytes   Result Value Ref Range    Reticulocyte Auto % 3.2 (H) 0.5 - 2.7 %    Absolute Auto Reticulocyte 101.4 27.0 - 120.0 10*9/L    Retic HGB Content 30.5 29.7 - 36.1 pg   Lactate dehydrogenase   Result Value Ref Range    LDH 612 (H) 338 - 610 U/L   Haptoglobin   Result Value Ref Range    Haptoglobin 27 (L) 30 - 200 mg/dL   Comprehensive Metabolic Panel   Result Value Ref Range    Sodium 141 135 - 145 mmol/L    Potassium 4.0 3.5 - 5.0 mmol/L    Chloride 104 98 - 107 mmol/L    CO2 29.0 22.0 - 30.0 mmol/L    Anion Gap 8 (L) 9 - 15 mmol/L    BUN 25 (H) 7 - 21 mg/dL    Creatinine 1.61 (H) 0.60 - 1.00 mg/dL    BUN/Creatinine Ratio 20     EGFR CKD-EPI Non-African American, Female 48 (L) >=60 mL/min/1.39m2    EGFR  CKD-EPI African American, Female 55 (L) >=60 mL/min/1.78m2    Glucose 88 65 - 179 mg/dL    Calcium 9.2 8.5 - 16.1 mg/dL    Albumin 4.0 3.5 - 5.0 g/dL    Total Protein 6.7 6.5 - 8.3 g/dL    Total Bilirubin 0.5 0.0 - 1.2 mg/dL    AST 20 17 - 47 U/L    ALT 13 13 - 69 U/L    Alkaline Phosphatase 60 38 - 126 U/L   Hepatitis B Core Antibody, total   Result Value Ref Range    Hep B Core Total Ab Reactive (A) Nonreactive   Hepatitis B Surface Antibody   Result Value Ref Range    Hep B S Ab Reactive (A) Nonreactive, Grayzone    Hepatitis B Surface Ab Quant 25.75 (H) <8.00 m(IU)/mL   Hepatitis B Surface Antigen   Result Value Ref Range    Hepatitis B Surface Ag Nonreactive Nonreactive   Hepatitis C Antibody   Result Value Ref Range    Hepatitis C Ab Reactive (A) Nonreactive   Hepatitis C RNA, Quantitative, PCR   Result Value Ref Range    HCV RNA Not Detected     HCV RNA (IU)  <=0 IU/mL    HCV RNA Log(10)  <0.00 log IU/mL    HCV RNA Comment       Hepatitis C virus RNA quantification is performed using the FDA-approved Abbott RealTime PCR HCV test, targeting the 5' UTR. This test can quantify HCV RNA over the range of 12-100,000,000 IU/mL (1.08 log(10) - 8.0 log(10) IU/mL). Both the limit of detection and limit of quantification are 12 IU/mL. The reference range for this assay is Not Detected.   Direct Antiglobulin Test   Result Value Ref Range    Direct Coombs Anti-Human Globulin POS     Direct Coombs Anti-IgG POS     Direct Coombs Anti-Complement POS    CBC w/ Differential   Result Value Ref Range    WBC 3.9 (L) 4.5 - 11.0 10*9/L    RBC 3.30 (L) 4.00 - 5.20 10*12/L    HGB 10.0 (L) 12.0 - 16.0 g/dL    HCT 09.6 (L) 04.5 - 46.0 %    MCV 97.1 80.0 - 100.0 fL    MCH 30.2 26.0 - 34.0 pg    MCHC 31.1 31.0 - 37.0 g/dL    RDW 40.9 81.1 - 91.4 %    MPV 8.4 7.0 - 10.0 fL    Platelet 139 (L) 150 - 440 10*9/L    Neutrophils % 66.6 %    Lymphocytes % 24.4 %    Monocytes % 4.8 %    Eosinophils % 1.9 %    Basophils % 0.9 %    Absolute Neutrophils 2.6 2.0 - 7.5 10*9/L    Absolute Lymphocytes 1.0 (L) 1.5 - 5.0 10*9/L    Absolute Monocytes 0.2 0.2 - 0.8 10*9/L    Absolute Eosinophils 0.1 0.0 - 0.4 10*9/L    Absolute Basophils 0.0 0.0 - 0.1 10*9/L    Large Unstained Cells 1 0 - 4 %    Macrocytosis Slight (A) Not Present    Hypochromasia Moderate (A) Not Present     No monoclonal gammopathy and normal peripheral blood flow study per outside labs  Mycoplasma serologies negative  Cold agglutinin titer 'negative x 2'  ANA negative    CT (11/17) showing 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 No adenopathy. Possible fatty liver. No splenomegaly

## 2018-04-26 ENCOUNTER — Encounter
Admit: 2018-04-26 | Discharge: 2018-04-27 | Payer: PRIVATE HEALTH INSURANCE | Attending: Internal Medicine | Primary: Internal Medicine

## 2018-04-26 DIAGNOSIS — D591 Other autoimmune hemolytic anemias: Secondary | ICD-10-CM

## 2018-04-26 DIAGNOSIS — R768 Other specified abnormal immunological findings in serum: Secondary | ICD-10-CM

## 2018-04-26 DIAGNOSIS — Z8619 Personal history of other infectious and parasitic diseases: Secondary | ICD-10-CM

## 2018-04-26 MED ORDER — ENTECAVIR 0.5 MG TABLET
ORAL_TABLET | ORAL | 5 refills | 0 days | Status: CP
Start: 2018-04-26 — End: 2018-05-17

## 2018-04-26 NOTE — Unmapped (Addendum)
Dear Ms. Niehoff,    Thank you for coming in today.  As discussed, we will do additional blood work today and start on a new medication called entecavir 0.5mg  every other day (reduced for kidney function) for prevention of hepatitis B reactivation.  The plan is to start at least 2-4 weeks prior to Rituxan, which would need to be continued for 12 months after the last dose of Rituxan.    ?? The ID clinic phone number is [phone] (toll free [phone]).  ?? The ID clinic fax number is [phone].    For urgent issues on nights and weekends you may reach the ID Physician on call through the [facility] Operator at [phone].     Best regards,  Jarome Matin, MD  Fellow, [facility] Division of Infectious Diseases     [facility]   [address], 1st floor   [place], [place]. [id number]       -----------------------------------------------------------------------------------------------------------------------------------------------------------------------------------------------------------------------    Charlsie Quest Names: [name]   Braulio Conte Names: [place]   APO-Entecavir;  ??   Auro-Entecavir;  ??   Baraclude;  ??   JAMP-Entecavir;  ??   PMS-Entecavir    Warning   ??Hepatitis B has gotten worse when this drug was stopped in some people with hepatitis B. Close follow-up for a few months is needed when therapy is stopped in people who have hepatitis B. Do not stop taking this drug without calling your doctor. Talk with your doctor.   ??This drug may rarely cause swollen liver and an acid health problem in the blood. This may be deadly in some cases. The chance may be higher in women, in overweight people, and in people who have taken drugs like this one for a long time. Talk with your doctor.   ??If you have HIV that is not being treated, it may become harder to treat after taking this drug. HIV testing needs to be done before taking this drug. Talk with your doctor.    What is this drug used for?   ??It is used to treat hepatitis B infection.    What do I need to tell my doctor BEFORE I take this drug?   If you have an allergy to entecavir or any other part of this drug.   If you are allergic to any drugs like this one, any other drugs, foods, or other substances. Tell your doctor about the allergy and what signs you had, like rash; hives; itching; shortness of breath; wheezing; cough; swelling of face, lips, tongue, or throat; or any other signs.   This drug may interact with other drugs or health problems.   Tell your doctor and pharmacist about all of your drugs (prescription or OTC, natural products, vitamins) and health problems. You must check to make sure that it is safe for you to take this drug with all of your drugs and health problems. Do not start, stop, or change the dose of any drug without checking with your doctor.    What are some things I need to know or do while I take this drug?   For all patients taking this drug:   Tell all of your health care providers that you take this drug. This includes your doctors, nurses, pharmacists, and dentists.   Have blood work checked as you have been told by the doctor. Talk with the doctor.   You will need to have an HIV test as you were told by your  doctor. Talk with your doctor.   This drug is not a cure for hepatitis infection. Stay under the care of your doctor.   This drug does not stop the spread of diseases like HIV or hepatitis that are passed through blood or having sex. Do not have any kind of sex without using a latex or polyurethane condom. Do not share needles or other things like toothbrushes or razors. Talk with your doctor.   If you are 48 or older, use this drug with care. You could have more side effects.   Tell your doctor if you are pregnant or plan on getting pregnant. You will need to talk about the benefits and risks of using this drug while you are pregnant.   Tell your doctor if you are breast-feeding. You will need to talk about any risks to your baby.   Children:   If your child's weight changes, talk with the doctor. The dose of this drug may need to be changed.    What are some side effects that I need to call my doctor about right away?   WARNING/CAUTION:??Even though it may be rare, some people may have very bad and sometimes deadly side effects when taking a drug. Tell your doctor or get medical help right away if you have any of the following signs or symptoms that may be related to a very bad side effect:   ??Signs of an allergic reaction, like rash; hives; itching; red, swollen, blistered, or peeling skin with or without fever; wheezing; tightness in the chest or throat; trouble breathing, swallowing, or talking; unusual hoarseness; or swelling of the mouth, face, lips, tongue, or throat.   ??Signs of liver problems like dark urine, feeling tired, not hungry, upset stomach or stomach pain, light-colored stools, throwing up, or yellow skin or eyes.   ??Signs of too much lactic acid in the blood (lactic acidosis) like fast breathing, fast heartbeat, a heartbeat that does not feel normal, very bad upset stomach or throwing up, feeling very sleepy, shortness of breath, feeling very tired or weak, very bad dizziness, feeling cold, or muscle pain or cramps.    What are some other side effects of this drug?   All drugs may cause side effects. However, many people have no side effects or only have minor side effects. Call your doctor or get medical help if any of these side effects or any other side effects bother you or do not go away:   Headache.   Dizziness.   Upset stomach.   Feeling tired or weak.   These are not all of the side effects that may occur. If you have questions about side effects, call your doctor. Call your doctor for medical advice about side effects.   You may report side effects to your national health agency.    How is this drug best taken?   Use this drug as ordered by your doctor. Read all information given to you. Follow all instructions closely.   All products:   Take on an empty stomach. Take 2 hours before or 2 hours after meals.   Keep taking this drug as you have been told by your doctor or other health care provider, even if you feel well.   It is important that you do not miss or skip a dose of this drug during treatment.   Liquid (solution):   Measure liquid doses carefully. Use the measuring device that comes with this drug.   Swallow the liquid right  from the measuring spoon. Do not mix with water or any other liquid. Rinse the spoon and let air dry after each dose.    What do I do if I miss a dose?   Take a missed dose as soon as you think about it, on an empty stomach.   If it is close to the time for your next dose, skip the missed dose and go back to your normal time.   Do not take 2 doses at the same time or extra doses.   If you are not sure what to do if you miss a dose, call your doctor.    How do I store and/or throw out this drug?   Store at room temperature.   Keep lid tightly closed.   Store in the carton to protect from light.   Store in a dry place. Do not store in a bathroom.   Keep all drugs in a safe place. Keep all drugs out of the reach of children and pets.   Throw away unused or expired drugs. Do not flush down a toilet or pour down a drain unless you are told to do so. Check with your pharmacist if you have questions about the best way to throw out drugs. There may be drug take-back programs in your area.    General drug facts   If your symptoms or health problems do not get better or if they become worse, call your doctor.   Do not share your drugs with others and do not take anyone else's drugs.   Keep a list of all your drugs (prescription, natural products, vitamins, OTC) with you. Give this list to your doctor.   Talk with the doctor before starting any new drug, including prescription or OTC, natural products, or vitamins.   Some drugs may have another patient information leaflet. If you have any questions about this drug, please talk with your doctor, nurse, pharmacist, or other health care provider.   If you think there has been an overdose, call your poison control center or get medical care right away. Be ready to tell or show what was taken, how much, and when it happened.

## 2018-04-26 NOTE — Unmapped (Signed)
Meagan Harris is being seen in consultation at the request of Dr. Freada Bergeron for evaluation of safety of Rituxan (for AIHA) in the setting of abnormal HBV labs/past HBV infection.    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     #Abnormal HBV labs - positive HBV Core Ag, positive HBV surface Ab, negative HBV sAg  -Most likely consistent with past infection although HBV sAg negative on recent labs  -Per [facility], HBV core Ag + with undetectable VL, HBV sAb positive and HBV sAg negative in 07/2016  -LFT's wnl  -Check HBV DNA/viral load today  -Given the B-cell depleting nature of Rituxan, she would be at high risk for reactivation of HBV infection if Rituxan is initiated. Therefore, would recommend initiating anti-HBV prophylaxis with entecavir.   -Would need anti-HBV prophylaxis, at least 2-4 weeks prior to initiation of Rituxan. 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 Received pneumococcal 13, pneumovax 23 and HiB  -Has not had Shingrix in the past; however, unclear if it will be helpful for her as Shingrix has not been studied in patients with rheumatologic/autoimmune diseases. Will hold off at this time.    Recommendations:   -Check TB/IGRA today  -Check HBV DNA and HIV testing today  -Start entecavir 0.5mg  every other day for anti-HBV prophylaxis in anticipation of Rituxan. Dose-adjusted for renal function of Estimated Creatinine Clearance: 47 mL/Meagan Lohse (A) (based on SCr of 1.23 mg/dL (H)). Medication has been sent to pharmacy on file.  -Entecavir would need to be started 2-4 weeks prior Rituxan initiation and maintained on prophylaxis through 12 months after the last dose of Rituxan   -Follow-up in 4 weeks  -Would need to recheck renal function to ensure the correct dosing of entecavir    Discussion and Education:  Today I provided education, counseling and medical advice on the following:  ??  Other: Magazine features editor and counseling services took 15 minutes of the 45 minute visit.        Subjective:    HPI: Meagan Harris is a 62 y.o. female is being seen for evaluation of safety of Rituxan for warm autoimmune hemolytic anemia.    She was diagnosed with warm autoimmune hemolytic anemia in 07/2016 and has been on prednisone on and off since then. She was referred to Dr. Freada Bergeron from her previous hematology/oncologist at [facility] for further management of AIHA. She was seen by Dr. Freada Bergeron on 04/14/18 and options include Rituxan as a steroid-sparing agent, but her hepatitis B labs were abnormal, indicating likely prior HBV infection and was referred to [name] for further evaluation/recommendation regarding Rituxan use.     She denies prior HBV testing before 07/2016. She has had 3 blood transfusions, and she is not sure if it happened before or after the HBV testing. She denies history of IVDU but remote history of illicit drug use during teenage years. She recalls that she was very  jaundiced when she was 17 or 18, which she had to stay at home for a few weeks. However, she does not recall the diagnosis or work-up done at the time.   She reports that her husband died of cirrhosis in 61.  She reports that she has ongoing diarrhea, which she was diagnosed with IBS, usually diarrhea prone but no associated abdominal pain.     She otherwise denies fevers, chills, chest pain, shortness of breath, dysuria or rash.     Prior ID evaluation:   Per [facility], had history of diarrhea due to E. Coli (stool PCR+), positive Hep C Ab (07/2016), positive Hep B core Ag and Hep B surface Ab (07/2016)    #Diarrhea 2/2 E. Coli  -Failed azithromycin, had severe allergic rxn to Cipro and underwent tx with Bactrim with elevated Cr     Past Medical History:   Diagnosis Date   ??? 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 Autoimmune hemolytic anemia (CMS-HCC)     Warm Ab   ??? Gout    ??? Hyperlipidemia    ??? Hypertension      Past Surgical History:   Procedure Laterality Date   ??? ARTHROSCOPIC REPAIR ACL      Approx 05-Mar-2009   ??? CESAREAN SECTION  1994   ??? lower extremity stent      2/2 atheroembolism of bilateral lower extremities     Medications:  Current antibiotics:N/A  Prior/Current immunomodulators:Prednisone 10mg  from 4/9 to 8/21  Other medications reviewed.    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    Review of Systems  A 12 point review of systems was negative except for pertinent items noted in the HPI.    Objective:    Physical Examination:  Body mass index is 31.84 kg/m??.  BP 114/63  - Pulse 79  - Temp 36.6 ??C (Oral)  - Ht 157 cm (5' 1.81)  - Wt 78.5 kg (173 lb)  - BMI 31.84 kg/m??     GEN:  looks well, no apparent distress  EYES: sclerae anicteric and non injected and PERRL, EOMI  ENT:no thrush, leukoplakia or oral lesions  LYMPH:no cervical, supraclavicular, axillary or inguinal LAD  CV:RRR, no m/r/g, S1/S2  PULM:CTAB ant/post, normal work of breathing  VWU:JWJX, NTND, no masses  BJ:YNWGNFAO  RECTAL:deferred  SKIN:no petechiae, ecchymoses or obvious rashes on clothed exam; varicose veins in BLE  MSK:no joint tenderness  NEURO:no gross focal deficits  PSYCH:attentive, appropriate affect, good eye contact, fluent speech     Labs:  Lab Results   Component Value Date    WBC 3.9 (L) 04/14/2018    HCT 32.1 (L) 04/14/2018    PLT 139 (L) 04/14/2018    NEUTROABS 2.6 04/14/2018    K 4.0 04/14/2018    BUN 25 (H) 04/14/2018    CREATININE 1.23 (H) 04/14/2018  GLU 88 04/14/2018    ALBUMIN 4.0 04/14/2018    BILITOT 0.5 04/14/2018    AST 20 04/14/2018    ALT 13 04/14/2018    ALKPHOS 60 04/14/2018     Imaging:  None    Microbiology:  None    Immunizations:  Immunization History   Administered Date(s) Administered   ??? HiB-PRP-T 04/14/2018   ??? PNEUMOCOCCAL POLYSACCHARIDE 23 07/22/2016   ??? Pneumococcal Conjugate 13-Valent 04/14/2018

## 2018-04-27 NOTE — Unmapped (Signed)
I saw and evaluated Ms. Meagan Harris, participating in the key portions of the service.??She is a 62 yr-old woman with warm autoimmune hemolytic anemia, being considered for treatment with rituximab, who has positive HBcAb and negative HBsAg.  Since she would be categorized as having high risk of Hep B reactivation in association with rituximab, even in the setting of negative HBsAg, would therefore recommend antiviral prophylaxis with entecavir.  I reviewed the ID Fellow's note and agree with the Fellow's findings and plan.     Fransico Michael, MD, MS

## 2018-04-28 LAB — QUANTIFERON TB GOLD PLUS
QUANTIFERON ANTIGEN 1 MINUS NIL: -0.01 [IU]/mL
QUANTIFERON ANTIGEN 2 MINUS NIL: -0.02 [IU]/mL
QUANTIFERON MITOGEN: 0.26 [IU]/mL
QUANTIFERON TB GOLD PLUS: UNDETERMINED — AB
QUANTIFERON TB NIL VALUE: 0.07 [IU]/mL

## 2018-04-28 LAB — QUANTIFERON ANTIGEN 2 MINUS NIL: Lab: -0.02

## 2018-04-28 LAB — HEPATITIS B DNA, ULTRAQUANTITATIVE, PCR

## 2018-04-28 LAB — HBV DNA COMMENT: Lab: 0

## 2018-05-01 ENCOUNTER — Encounter: Payer: Self-pay | Admitting: Hematology and Oncology

## 2018-05-10 ENCOUNTER — Telehealth: Payer: Self-pay | Admitting: *Deleted

## 2018-05-10 NOTE — Telephone Encounter (Signed)
Called patient and LVM to inquire patient's status since referral to [facility].  Asked her to call us back and let us know how she is doing.

## 2018-05-12 ENCOUNTER — Encounter
Admit: 2018-05-12 | Discharge: 2018-05-13 | Payer: PRIVATE HEALTH INSURANCE | Attending: Hematology & Oncology | Primary: Hematology & Oncology

## 2018-05-12 DIAGNOSIS — Z8619 Personal history of other infectious and parasitic diseases: Principal | ICD-10-CM

## 2018-05-12 DIAGNOSIS — D591 Other autoimmune hemolytic anemias: Secondary | ICD-10-CM

## 2018-05-17 ENCOUNTER — Encounter
Admit: 2018-05-17 | Discharge: 2018-05-18 | Payer: PRIVATE HEALTH INSURANCE | Attending: Internal Medicine | Primary: Internal Medicine

## 2018-05-17 DIAGNOSIS — Z8619 Personal history of other infectious and parasitic diseases: Secondary | ICD-10-CM

## 2018-05-17 DIAGNOSIS — D591 Other autoimmune hemolytic anemias: Secondary | ICD-10-CM

## 2018-05-17 DIAGNOSIS — Z79899 Other long term (current) drug therapy: Secondary | ICD-10-CM

## 2018-05-17 DIAGNOSIS — R768 Other specified abnormal immunological findings in serum: Principal | ICD-10-CM

## 2018-05-17 MED ORDER — ENTECAVIR 1 MG TABLET
ORAL_TABLET | Freq: Every day | ORAL | 5 refills | 0.00000 days | Status: CP
Start: 2018-05-17 — End: 2018-05-17

## 2018-05-17 MED ORDER — ENTECAVIR 0.5 MG TABLET
ORAL_TABLET | Freq: Every day | ORAL | 2 refills | 0.00000 days | Status: CP
Start: 2018-05-17 — End: 2018-11-13

## 2018-05-26 ENCOUNTER — Ambulatory Visit: Admit: 2018-05-26 | Discharge: 2018-05-27 | Payer: PRIVATE HEALTH INSURANCE

## 2018-05-26 DIAGNOSIS — D591 Other autoimmune hemolytic anemias: Principal | ICD-10-CM

## 2018-06-01 ENCOUNTER — Encounter: Admit: 2018-06-01 | Discharge: 2018-06-01 | Payer: PRIVATE HEALTH INSURANCE

## 2018-06-01 DIAGNOSIS — D591 Other autoimmune hemolytic anemias: Principal | ICD-10-CM

## 2018-06-02 ENCOUNTER — Other Ambulatory Visit: Payer: Self-pay | Admitting: Urgent Care

## 2018-06-09 ENCOUNTER — Encounter: Admit: 2018-06-09 | Discharge: 2018-06-10 | Payer: PRIVATE HEALTH INSURANCE

## 2018-06-09 DIAGNOSIS — D591 Other autoimmune hemolytic anemias: Principal | ICD-10-CM

## 2018-06-16 ENCOUNTER — Encounter: Admit: 2018-06-16 | Discharge: 2018-06-17 | Payer: PRIVATE HEALTH INSURANCE

## 2018-06-16 DIAGNOSIS — D591 Other autoimmune hemolytic anemias: Principal | ICD-10-CM

## 2018-06-23 ENCOUNTER — Encounter
Admit: 2018-06-23 | Discharge: 2018-06-24 | Payer: PRIVATE HEALTH INSURANCE | Attending: Hematology & Oncology | Primary: Hematology & Oncology

## 2018-06-23 DIAGNOSIS — D591 Other autoimmune hemolytic anemias: Principal | ICD-10-CM

## 2018-08-16 ENCOUNTER — Encounter
Admit: 2018-08-16 | Discharge: 2018-08-17 | Payer: PRIVATE HEALTH INSURANCE | Attending: Internal Medicine | Primary: Internal Medicine

## 2018-08-16 DIAGNOSIS — D591 Other autoimmune hemolytic anemias: Secondary | ICD-10-CM

## 2018-08-16 DIAGNOSIS — Z8619 Personal history of other infectious and parasitic diseases: Secondary | ICD-10-CM

## 2018-08-16 DIAGNOSIS — Z79899 Other long term (current) drug therapy: Principal | ICD-10-CM

## 2018-08-27 ENCOUNTER — Other Ambulatory Visit: Payer: Self-pay | Admitting: Hematology and Oncology

## 2018-08-27 DIAGNOSIS — D598 Other acquired hemolytic anemias: Secondary | ICD-10-CM

## 2018-10-06 ENCOUNTER — Encounter
Admit: 2018-10-06 | Discharge: 2018-10-07 | Payer: PRIVATE HEALTH INSURANCE | Attending: Hematology & Oncology | Primary: Hematology & Oncology

## 2018-10-06 ENCOUNTER — Other Ambulatory Visit: Payer: Self-pay | Admitting: Nurse Practitioner

## 2018-10-06 ENCOUNTER — Encounter: Payer: Self-pay | Admitting: Hematology and Oncology

## 2018-10-06 DIAGNOSIS — D591 Other autoimmune hemolytic anemias: Principal | ICD-10-CM

## 2018-10-06 MED ORDER — FOLIC ACID 1 MG TABLET
ORAL_TABLET | Freq: Every day | ORAL | 11 refills | 0.00000 days | Status: CP
Start: 2018-10-06 — End: ?

## 2018-10-13 ENCOUNTER — Other Ambulatory Visit: Payer: Self-pay

## 2018-10-13 ENCOUNTER — Other Ambulatory Visit: Payer: BLUE CROSS/BLUE SHIELD

## 2018-10-13 ENCOUNTER — Ambulatory Visit: Payer: BLUE CROSS/BLUE SHIELD | Admitting: Hematology and Oncology

## 2018-10-13 DIAGNOSIS — D696 Thrombocytopenia, unspecified: Secondary | ICD-10-CM

## 2018-10-18 ENCOUNTER — Inpatient Hospital Stay: Payer: BLUE CROSS/BLUE SHIELD | Admitting: Hematology and Oncology

## 2018-10-18 ENCOUNTER — Inpatient Hospital Stay: Payer: BLUE CROSS/BLUE SHIELD | Attending: Hematology and Oncology

## 2018-10-18 ENCOUNTER — Other Ambulatory Visit: Payer: Self-pay | Admitting: Hematology and Oncology

## 2018-10-18 ENCOUNTER — Encounter: Payer: Self-pay | Admitting: Hematology and Oncology

## 2018-10-18 VITALS — BP 150/80 | HR 73 | Temp 97.9°F | Resp 16 | Wt 165.2 lb

## 2018-10-18 DIAGNOSIS — R768 Other specified abnormal immunological findings in serum: Secondary | ICD-10-CM

## 2018-10-18 DIAGNOSIS — D591 Autoimmune hemolytic anemia, unspecified: Secondary | ICD-10-CM

## 2018-10-18 DIAGNOSIS — N189 Chronic kidney disease, unspecified: Secondary | ICD-10-CM | POA: Diagnosis not present

## 2018-10-18 DIAGNOSIS — N289 Disorder of kidney and ureter, unspecified: Secondary | ICD-10-CM

## 2018-10-18 DIAGNOSIS — B1910 Unspecified viral hepatitis B without hepatic coma: Secondary | ICD-10-CM

## 2018-10-18 DIAGNOSIS — E538 Deficiency of other specified B group vitamins: Secondary | ICD-10-CM

## 2018-10-18 DIAGNOSIS — D696 Thrombocytopenia, unspecified: Secondary | ICD-10-CM

## 2018-10-18 DIAGNOSIS — Z87891 Personal history of nicotine dependence: Secondary | ICD-10-CM

## 2018-10-18 DIAGNOSIS — R918 Other nonspecific abnormal finding of lung field: Secondary | ICD-10-CM

## 2018-10-18 LAB — CBC WITH DIFFERENTIAL/PLATELET
Abs Immature Granulocytes: 0.05 10*3/uL (ref 0.00–0.07)
Basophils Absolute: 0.1 10*3/uL (ref 0.0–0.1)
Basophils Relative: 2 %
Eosinophils Absolute: 0.1 10*3/uL (ref 0.0–0.5)
Eosinophils Relative: 4 %
HCT: 35.3 % — ABNORMAL LOW (ref 36.0–46.0)
Hemoglobin: 11.3 g/dL — ABNORMAL LOW (ref 12.0–15.0)
Immature Granulocytes: 2 %
Lymphocytes Relative: 21 %
Lymphs Abs: 0.7 10*3/uL (ref 0.7–4.0)
MCH: 31.4 pg (ref 26.0–34.0)
MCHC: 32 g/dL (ref 30.0–36.0)
MCV: 98.1 fL (ref 80.0–100.0)
Monocytes Absolute: 0.4 10*3/uL (ref 0.1–1.0)
Monocytes Relative: 12 %
Neutro Abs: 1.9 10*3/uL (ref 1.7–7.7)
Neutrophils Relative %: 59 %
Platelets: 125 10*3/uL — ABNORMAL LOW (ref 150–400)
RBC: 3.6 MIL/uL — ABNORMAL LOW (ref 3.87–5.11)
RDW: 13.9 % (ref 11.5–15.5)
WBC: 3.2 10*3/uL — ABNORMAL LOW (ref 4.0–10.5)
nRBC: 0 % (ref 0.0–0.2)

## 2018-10-18 LAB — RETICULOCYTES
Immature Retic Fract: 15.5 % (ref 2.3–15.9)
RBC.: 3.73 MIL/uL — ABNORMAL LOW (ref 3.87–5.11)
Retic Count, Absolute: 120.5 10*3/uL (ref 19.0–186.0)
Retic Ct Pct: 3.2 % — ABNORMAL HIGH (ref 0.4–3.1)

## 2018-10-18 LAB — COMPREHENSIVE METABOLIC PANEL
ALT: 14 U/L (ref 0–44)
AST: 20 U/L (ref 15–41)
Albumin: 4.2 g/dL (ref 3.5–5.0)
Alkaline Phosphatase: 95 U/L (ref 38–126)
Anion gap: 8 (ref 5–15)
BUN: 25 mg/dL — ABNORMAL HIGH (ref 6–20)
CO2: 29 mmol/L (ref 22–32)
Calcium: 9.4 mg/dL (ref 8.9–10.3)
Chloride: 105 mmol/L (ref 98–111)
Creatinine, Ser: 0.99 mg/dL (ref 0.44–1.00)
GFR calc Af Amer: >60 mL/min (ref >60)
GFR calc non Af Amer: >60 mL/min (ref >60)
Glucose, Bld: 105 mg/dL — ABNORMAL HIGH (ref 70–99)
Potassium: 4 mmol/L (ref 3.5–5.1)
Sodium: 142 mmol/L (ref 135–145)
Total Bilirubin: 0.5 mg/dL (ref 0.3–1.2)
Total Protein: 7.5 g/dL (ref 6.5–8.1)

## 2018-10-18 LAB — FOLATE: Folate: 41 ng/mL (ref >5.9)

## 2018-10-18 LAB — VITAMIN B12: Vitamin B-12: 2439 pg/mL — ABNORMAL HIGH (ref 180–914)

## 2018-10-18 NOTE — Progress Notes (Signed)
Pt here for follow up. Denies any concerns at this time.  

## 2018-10-18 NOTE — Progress Notes (Signed)
[facility] day:  10/18/2018   Chief Complaint: Tina Page is a 63 y.o. female with a right middle lobe mass, warm autoimmune hemolytic anemia, and renal insufficiency who is seen for reassessment.  HPI:  The patient was last seen in the hematology clinic on 03/25/2018.  At that time, she had chronic fatigue.  She had diarrhea x 1 month.  Patient had infectious diarrheal infection (EPEC).  She was followed by ID in [place].  Exam was stable.    She was referred to [facility] for evaluation of hemolytic anemia.  She was last seen in the hematology clinic on 10/06/2018 by Dr. Tacey Ruiz.  Notes reviewed.  Because of prior hepatitis B infection, she was evaluated by Dr. Hetty Ely in ID who recommended prophylaxis with entecavir prior to rituximab. She subsequently received rituximab, with her 4th dose on 06/16/2018 and has since been weaned off steroids.  She was taking entacavir and folic acid daily.  She follows up with ID.  CBC on 10/06/2018 revealed a hematocrit of 36.7, hemoglobin 11.7, MCV 95.7, platelets 139,000, WBC 3700 and ANC of 2200.  Creatinine was 1.0.  LFTs were normal.  LDH was 588 (338-610).  Haptoglobin was 20 (low).  Retic was 3.3%.  TSH was 2.645.  She was last seen by Dr Gabriel Rainwater (fellow) and Dr Lindwood Qua, infectious disease at [facility] on 08/16/2018.  Anticipate that she will need to remain on anti-HBV prophylaxis for 1 year from the last dose of Rituxan.  If creatinine > 1.3 would need entecavir renally adjusted.  She was noted to have an indeterminate quantiferon gold result.  She had an anergic response to mitogen and the result was indeterminate.  Based on risk assessment, she was otherwise considered low risk for TB as no known risk factors or exposure  During the interim, she has felt "ok".  She states that she goes every 6 weeks to have her blood drawn.  She has follow-up with Dr Gita Kudo.  She is UTD on her immunizations.  She  states that she still feels tired, but she is getting used to it.  She states that she saw Dr Holley Raring.  Creatinine clearance was > 60 ml/min.  She has been off steroids since 03/2018.  She has been treated for IBS.   Past Medical History:  Diagnosis Date  . Anemia   . Atherosclerosis of native arteries of extremity with intermittent claudication ([place]) 07/20/2016  . COPD (chronic obstructive pulmonary disease) ([place])   . 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 Elevated liver function tests 11/03/2017  . GERD (gastroesophageal reflux disease)   . Heme positive stool 01/29/2015  . Hepatitis C 07/12/2016  . HLD (hyperlipidemia)   . Hypertension   . Hypothyroidism   . 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 Thrombocytopenia ([place]) 10/04/2016    Past Surgical History:  Procedure Laterality Date  . ELECTROMAGNETIC NAVIGATION BROCHOSCOPY N/A 12/07/2017   Procedure: ELECTROMAGNETIC NAVIGATION BRONCHOSCOPY;  Surgeon: Flora Lipps, MD;  Location: [facility] ORS;  Service: Cardiopulmonary;  Laterality: N/A;  . ESOPHAGOGASTRODUODENOSCOPY (EGD) WITH PROPOFOL N/A 07/08/2016   Procedure: ESOPHAGOGASTRODUODENOSCOPY (EGD) WITH PROPOFOL;  Surgeon: Manya Silvas, MD;  Location: [facility] ENDOSCOPY;  Service: Endoscopy;  Laterality: N/A;  . KNEE SURGERY Right    repair of acl tear  . PERIPHERAL VASCULAR CATHETERIZATION N/A 07/10/2016   Procedure: Lower Extremity Angiography;  Surgeon: Katha Cabal, MD;  Location: [facility]  INVASIVE CV LAB;  Service: Cardiovascular;  Laterality: N/A;  . PERIPHERAL VASCULAR CATHETERIZATION N/A 07/10/2016   Procedure: Abdominal Aortogram w/Lower Extremity;  Surgeon: Katha Cabal, MD;  Location: [facility] CV LAB;  Service: Cardiovascular;  Laterality: N/A;  . PERIPHERAL VASCULAR CATHETERIZATION  07/10/2016   Procedure: Lower Extremity Intervention;  Surgeon: Katha Cabal, MD;  Location: [facility] CV LAB;  Service: Cardiovascular;;     Family History  Problem Relation Age of Onset  . Diabetes Mother   . Hypertension Mother   . Diabetes Maternal Grandfather   . Hypertension Maternal Grandfather     Social History:  reports that she quit smoking about 2 years ago. Her smoking use included cigarettes. She smoked 0.50 packs per day. She has never used smokeless tobacco. She reports that she does not drink alcohol or use drugs.  She has a 21 pack year smoking history (1/2 pack/day from age [age]).  The patient works at [employer]  She is exposed to cold temperatures.  She works 3rd shift.  She has a daughter, Tina Page and a daughter named Tina Page.  She lives in [place].  She is alone today.   Allergies:  Allergies  Allergen Reactions  . Codeine Anaphylaxis  . Ciprofloxacin Swelling    Facial swelling following single oral dose.     Current Medications: Current Outpatient Medications  Medication Sig Dispense Refill  . albuterol (PROVENTIL HFA;VENTOLIN HFA) 108 (90 Base) MCG/ACT inhaler Inhale 2 puffs into the lungs every 6 (six) hours as needed for wheezing or shortness of breath.     . clopidogrel (PLAVIX) 75 MG tablet Take 1 tablet (75 mg total) by mouth daily. 30 tablet 5  . cyanocobalamin 500 MCG tablet Take 500 mcg by mouth daily.    [name] Kitchen entecavir (BARACLUDE) 0.5 MG tablet Take 0.5 mg by mouth daily.    . folic acid (FOLVITE) 1 MG tablet TAKE 1 TABLET (1 MG TOTAL) BY MOUTH DAILY. 90 tablet 1  . hydrochlorothiazide (HYDRODIURIL) 25 MG tablet Take 25 mg by mouth daily.    [name] Kitchen levothyroxine (SYNTHROID, LEVOTHROID) 75 MCG tablet Take 75 mcg by mouth daily before breakfast.     . lisinopril (PRINIVIL,ZESTRIL) 20 MG tablet Take 20 mg by mouth daily.     [name] Kitchen loperamide (IMODIUM) 2 MG capsule Take 2 mg by mouth 2 (two) times daily.    [name] Kitchen omeprazole (PRILOSEC) 20 MG capsule Take 1 capsule (20 mg total) by mouth daily. 60 capsule 0  . potassium chloride (KLOR-CON) 20 MEQ packet Take 20 mEq by mouth daily.    .  pravastatin (PRAVACHOL) 40 MG tablet Take 40 mg by mouth every evening.    [name] Kitchen allopurinol (ZYLOPRIM) 100 MG tablet TAKE 1 TABLET BY MOUTH EVERY DAY 90 tablet 1   No current facility-administered medications for this visit.     Review of Systems:  GENERAL:  Feels "ok".  Chronic fatigue.  Working.  No fevers, sweats.  Weight down 7-8 pounds. PERFORMANCE STATUS (ECOG):  1 HEENT:  No visual changes, runny nose, sore throat, mouth sores or tenderness. Lungs: No shortness of breath or cough.  No hemoptysis. Cardiac:  No chest pain, palpitations, orthopnea, or PND. GI:  IBS.  No nausea, vomiting, diarrhea, constipation, melena or hematochezia. GU:  No urgency, frequency, dysuria, or hematuria. Musculoskeletal:  No back pain.  No joint pain.  No muscle tenderness. Extremities:  No pain or swelling. Skin:  No rashes or skin changes. Neuro:  No headache, numbness  or weakness, balance or coordination issues. Endocrine:  No diabetes, thyroid issues, hot flashes or night sweats. Psych:  No mood changes, depression or anxiety. Pain:  No focal pain. Review of systems:  All other systems reviewed and found to be negative.   Physical Exam: Blood pressure (!) 150/80, pulse 73, temperature 97.9 F (36.6 C), temperature source Oral, resp. rate 16, weight 165 lb 3.8 oz (75 kg), SpO2 100 %. GENERAL:  Well developed, well nourished, woman sitting comfortably in the exam room in no acute distress. MENTAL STATUS:  Alert and oriented to person, place and time. HEAD:  [name] hair.  Normocephalic, atraumatic, face symmetric, no Cushingoid features. EYES:  Glasses.  Blue eyes.  Pupils equal round and reactive to light and accomodation.  No conjunctivitis or scleral icterus. ENT:  Oropharynx clear without lesion.  Tongue normal. Mucous membranes moist.  RESPIRATORY:  Clear to auscultation without rales, wheezes or rhonchi. CARDIOVASCULAR:  Regular rate and rhythm without murmur, rub or gallop. ABDOMEN:  Soft,  non-tender, with active bowel sounds, and no hepatosplenomegaly.  No masses. SKIN:  No rashes, ulcers or lesions. EXTREMITIES: No edema, no skin discoloration or tenderness.  No palpable cords. LYMPH NODES: No palpable cervical, supraclavicular, axillary or inguinal adenopathy  NEUROLOGICAL: Unremarkable. PSYCH:  Appropriate.    Appointment on 10/18/2018  Component Date Value Ref Range Status  . Folate 10/18/2018 41.0  >5.9 ng/mL Final   Performed at [facility], [address] [place], [place] [address]  . Vitamin B-12 10/18/2018 2,439* 180 - 914 pg/mL Final   Comment: (NOTE) This assay is not validated for testing neonatal or myeloproliferative syndrome specimens for Vitamin B12 levels. Performed at [facility], [place] [address] [place], [place] [address]   . Retic Ct Pct 10/18/2018 3.2* 0.4 - 3.1 % Final  . RBC. 10/18/2018 3.73* 3.87 - 5.11 MIL/uL Final  . Retic Count, Absolute 10/18/2018 120.5  19.0 - 186.0 K/uL Final  . Immature Retic Fract 10/18/2018 15.5  2.3 - 15.9 % Final   Performed at [facility], [address], [place], [place] [address]  . Sodium 10/18/2018 142  135 - 145 mmol/L Final  . Potassium 10/18/2018 4.0  3.5 - 5.1 mmol/L Final  . Chloride 10/18/2018 105  98 - 111 mmol/L Final  . CO2 10/18/2018 29  22 - 32 mmol/L Final  . Glucose, Bld 10/18/2018 105* 70 - 99 mg/dL Final  . BUN 10/18/2018 25* 6 - 20 mg/dL Final  . Creatinine, Ser 10/18/2018 0.99  0.44 - 1.00 mg/dL Final  . Calcium 10/18/2018 9.4  8.9 - 10.3 mg/dL Final  . Total Protein 10/18/2018 7.5  6.5 - 8.1 g/dL Final  . Albumin 10/18/2018 4.2  3.5 - 5.0 g/dL Final  . AST 10/18/2018 20  15 - 41 U/L Final  . ALT 10/18/2018 14  0 - 44 U/L Final  . Alkaline Phosphatase 10/18/2018 95  38 - 126 U/L Final  . Total Bilirubin 10/18/2018 0.5  0.3 - 1.2 mg/dL Final  . GFR calc non Af Amer 10/18/2018 >60  >60 mL/min Final  . GFR calc Af Amer 10/18/2018 >60  >60 mL/min Final  . Anion  gap 10/18/2018 8  5 - 15 Final   Performed at [facility], [address], [place], [place] [address]  . WBC 10/18/2018 3.2* 4.0 - 10.5 K/uL Final  . RBC 10/18/2018 3.60* 3.87 - 5.11 MIL/uL Final  . Hemoglobin 10/18/2018 11.3* 12.0 - 15.0  g/dL Final  . HCT 10/18/2018 35.3* 36.0 - 46.0 % Final  . MCV 10/18/2018 98.1  80.0 - 100.0 fL Final  . MCH 10/18/2018 31.4  26.0 - 34.0 pg Final  . MCHC 10/18/2018 32.0  30.0 - 36.0 g/dL Final  . RDW 10/18/2018 13.9  11.5 - 15.5 % Final  . Platelets 10/18/2018 125* 150 - 400 K/uL Final  . nRBC 10/18/2018 0.0  0.0 - 0.2 % Final  . Neutrophils Relative % 10/18/2018 59  % Final  . Neutro Abs 10/18/2018 1.9  1.7 - 7.7 K/uL Final  . Lymphocytes Relative 10/18/2018 21  % Final  . Lymphs Abs 10/18/2018 0.7  0.7 - 4.0 K/uL Final  . Monocytes Relative 10/18/2018 12  % Final  . Monocytes Absolute 10/18/2018 0.4  0.1 - 1.0 K/uL Final  . Eosinophils Relative 10/18/2018 4  % Final  . Eosinophils Absolute 10/18/2018 0.1  0.0 - 0.5 K/uL Final  . Basophils Relative 10/18/2018 2  % Final  . Basophils Absolute 10/18/2018 0.1  0.0 - 0.1 K/uL Final  . Immature Granulocytes 10/18/2018 2  % Final  . Abs Immature Granulocytes 10/18/2018 0.05  0.00 - 0.07 K/uL Final   Performed at [facility], 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 One week prior to presentation, she felt like she was coming down with something. She denied any fever, runny nose, sore throat or cough. She had some diarrhea. She denied any new medications or herbal products.  Anemia workup revealed a cold autoantibody (IgG and complement).  Reticulocyte count was 11.3%.  Ferritin was 562.  Iron studies revealed a saturation of 20% and a TIBC of 214 (low).  B12 was 231 (low normal).  Folate was 42.   Peripheral  smear revealed rouleaux formation.  Labs on 02/18/2018 revealed normal flow cytometry, SPEP, and immunoglobulins.  Additional testing included the following + studies:  hepatitis C antibody, hepatitis B core antibody, CMV IgM, and EBV VCA (IgM and IgG).  Hepatitis B by PCR was negative.  Hepatitis C RNA was negative.  Mycoplasma pneumonia IgM was negative.  Reticulocyte count was 11.3% (high) indicating appropriate marrow response.  C3 and C4 were normal.  Cold agglutinin titer was negative x 2.  Negative studies included:  ANA, hepatitis B surface antigen, SPEP, and free light chain ratio.   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  MMA was initially 330 (normal).  Repeat MMA was elevated on 08/01/2016 confirming B12 deficiency.  Hepatitis B surface antibody was positive and hepatitis B surface antigen was negative on 08/06/2016.  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 There was no adenopathy. Spleen was normal.  Abdominal ultrasound on 04/15/2017 revealed a normal spleen and sludge in the gallbladder.  The liver was echogenic consistent fatty infiltration and/or hepatocellular disease.  She underwent PTCA and stent placement in right and left common iliac arteries and left external iliac artery on 07/10/2016.  She is on Plavix.  EGD on 03/19/2015 revealed gastritis in the body and antrum. Colonoscopy on 03/19/2015 revealed one hyperplastic polyp. EGD on 07/08/2016 was normal.  No evidence of bleeding.  She has received  6 units of warmed PRBCs to date (last on 08/01/2016). If Rituxan is needed, she requires entecavir 0.5 mg q day beginning 2 weeks prior to Rituxan.  In addition, hepatitis B viral level and LFTs should be checked monthly.  She began steroids (1 mg/kg) on 08/01/2016.   She stopped prednisone on 03/12/2017.  She is on folic acid 1 mg a day.    She has B12 deficiency.  B12 was 231 on 07/09/2016.  She is on oral B12.  B12 and folate were normal on 03/09/2017 and 10/12/2017.  She developed peri-oral and intranasal herpes simplex-1.  She was treated with valacyclovir and doxycycline on 10/19/2016.  She developed a transient elevated alkaline phosphatase on 10/19/2016 which subsequently normalized.  She was documented to have a warm autoantibody on 10/12/2017.  LDH and bilirubin were normal.  Hematocrit had improved from prior testing.    She developed flu like symptoms on 10/26/2017.  Symptoms included cough, myalgias, and fever (tmax unknown).  She was prescribed Mucinex, Tamiflu, and amoxicillin.  She only took amoxicillin x 5 days.  She developed increased liver function tests on 11/02/2017. CMV IgG was positive.  EBV VCA IgG, NA IgG, early antigen antibody IgG were elevated.  EBV VCA IgM was < 36.  Testing was c/w a convalescence/past infection or reactivated infection.  LFTs normalized on 11/15/2017.  Smooth muscle antibody was 39 (high) on 11/10/2017 and 34 (high) on 11/30/2017.   Abdomen and pelvic CT on 11/10/2017 revealed a 2.2 cm spiculated density in right middle lobe concerning  for malignancy.   Chest CT on 11/16/2017 revealed a 2.2 x 2.0 x 2.4 spiculated RIGHT middle lobe mass.  There was tiny nonspecific upper lobe pulmonary nodules greater on RIGHT, largest 3 mm, of uncertain etiology.  There was additional 8 x 8 mm opacity in the posterior sulcus of the RIGHT lower lobe.  PET scan on 11/22/2017 revealed a 2 cm hypermetabolic right middle lobe lung mass (SUV 3.4) consistent  with primary lung neoplasm.  There were no findings for mediastinal/hilar lymphadenopathy or metastatic disease.  There were areas of hypermetabolism involving the left oblique abdominal muscles and the anorectal junction.  PFTs on 11/18/2017 revealed an FEV1 of 1.22 liters (51%).   DLCO adj was 6.8 mL/mmHg/min (32%).  ENB done on 12/07/2017. Cytology was negative for malignancy. Pathology demonstrated organizing pneumonia and chronic bronchitis. Pathologist commented that organizing pneumonia spanned about 2 mm in one fragment. Cases of focal organizing pneumonia with hypermetabolism on FDG-PET have been described, but changes of this type can also be adjacent to a neoplasm. Patient prescribed a daily dose of Prednisone 10 mg, with re-imaging planned in 6-8 weeks.   Chest CT on 02/06/2018 revealed the spiculated 2.2 cm right middle lobe lesion was almost completely resolved with only a residual 4 mm irregular nodule identified at this location on a background of architectural distortion/evolving scar. There was a 5 mm right parahilar nodule stable since 11/16/2017 (not present on 07/07/2016).  There were tiny nodules in both upper lobes suggesting inhalation etiology.  She began prednisone 10 mg on 12/07/2017 (discontinued 03/2018).  She received Rituxan weekly x 4 (last dose 06/16/2018).  She has diarrhea.  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  She received azithromycin x 3 days.  She received ciprofloxacin x 10 days without improvement.  She has seen ID in [place].  She began Bactrim on 03/04/2018 (discontinued on 03/11/2018) secondary to increase in creatine.  She has renal  insufficiency.  Creatinine has been followed:  1.17 on 11/05/2017, 1.85 on 11/09/2017, 1.38 on 11/12/2017, 1.74 on 11/15/2017, 1.66 on 11/17/2017, and 1.48 on 11/23/2017.  Urinalysis on 11/15/2017 revealed revealed no hemoglobin, bilirubin or active sediment.  Symptomatically, she has chronic fatigue.  She denies any fevers or sweats.  She has lost 7-8 pounds.  Exam reveals no adenopathy or hepatosplenomegaly.  Plan: 1. Labs today:  CBC with diff, CMP, retic, B12, folate. 2. Hemolytic anemia  Hematocrit 35.3.  Hemoglobin 11.3.  Retic 3.2%.  Patient completed  steroids in 03/2018.  Patient received Rituxan (completed 06/16/2018).  3. Hepatitis B  Serologies confirmed prior exposure to hepatitis B.  Continue entecavir.  Patient will remain on anti-HBV prophylaxis for 1 year s/p last dose of Rituxan.     If creatinine > 1.3, entecavir renally adjusted.   4. B12 deficiency  B12 2439.  Folate 41.  Patient on oral B12 5.   Right middle lobe nodule  Chest CT on 02/06/2018 revealed almost complete resolution of 2.2 cm RML lesion.  Consider follow-up imaging in 1 year. 6.   RTC in 4 months for MD assessment and labs (CBC with diff, CMP, LDH, haptoglobin, retic).  I discussed the assessment and treatment plan with the patient.  The patient was provided an opportunity to ask questions and all were answered.  The patient agreed with the plan and demonstrated an understanding of the instructions.  The patient was advised to call back or seek an in person evaluation if the symptoms worsen or if the condition fails to improve as anticipated.    Lequita Asal, MD 10/18/2018, 4:35 PM

## 2018-10-19 ENCOUNTER — Telehealth: Payer: Self-pay

## 2018-10-19 NOTE — Telephone Encounter (Signed)
-----   Message from Lequita Asal, MD sent at 10/19/2018  9:28 AM EST ----- Regarding: Please call patient  How much B12 is she taking?  B12 is high.  M ----- Message ----- From: Buel Ream, Lab In [place] Sent: 10/18/2018   8:38 AM EST To: Lequita Asal, MD

## 2018-10-19 NOTE — Telephone Encounter (Signed)
-----   Message from Lequita Asal, MD sent at 10/19/2018  9:28 AM EST ----- Regarding: Please call patient  How much B12 is she taking?  B12 is high.  M ----- Message ----- From: Buel Ream, Lab In [place] Sent: 10/18/2018   8:38 AM EST To: Lequita Asal, MD

## 2018-10-19 NOTE — Telephone Encounter (Signed)
Spoke with Tina Page to verifiy her dose of B-12 she  is currently taking 500 mcg daily. Per Dr Mike Gip the patient levels are running to high and will consider of reducing her dose. The patient is understanding and agreeable with whatever decision Dr Mike Gip will make.

## 2018-10-19 NOTE — Telephone Encounter (Signed)
spoke with the patient to inform her that her B-12 levels are to high and need to stop taking , Per Dr Mike Gip. The patient was understanding and agreeable.

## 2018-11-15 DIAGNOSIS — Z79899 Other long term (current) drug therapy: Principal | ICD-10-CM

## 2018-11-15 DIAGNOSIS — D591 Other autoimmune hemolytic anemias: Principal | ICD-10-CM

## 2018-11-15 DIAGNOSIS — Z8619 Personal history of other infectious and parasitic diseases: Principal | ICD-10-CM

## 2018-11-15 DIAGNOSIS — R768 Other specified abnormal immunological findings in serum: Principal | ICD-10-CM

## 2018-11-16 MED ORDER — ENTECAVIR 0.5 MG TABLET
ORAL_TABLET | Freq: Every day | ORAL | 5 refills | 0.00000 days | Status: CP
Start: 2018-11-16 — End: 2019-02-06

## 2018-11-24 ENCOUNTER — Other Ambulatory Visit: Payer: Self-pay | Admitting: Urgent Care

## 2018-12-02 ENCOUNTER — Telehealth (INDEPENDENT_AMBULATORY_CARE_PROVIDER_SITE_OTHER): Payer: Self-pay | Admitting: Vascular Surgery

## 2018-12-02 NOTE — Telephone Encounter (Signed)
Bring her in with abis..can see me or schnier next week.

## 2018-12-05 ENCOUNTER — Other Ambulatory Visit (INDEPENDENT_AMBULATORY_CARE_PROVIDER_SITE_OTHER): Payer: Self-pay | Admitting: Nurse Practitioner

## 2018-12-05 ENCOUNTER — Ambulatory Visit (INDEPENDENT_AMBULATORY_CARE_PROVIDER_SITE_OTHER): Payer: BLUE CROSS/BLUE SHIELD | Admitting: Vascular Surgery

## 2018-12-05 ENCOUNTER — Other Ambulatory Visit: Payer: Self-pay

## 2018-12-05 ENCOUNTER — Ambulatory Visit (INDEPENDENT_AMBULATORY_CARE_PROVIDER_SITE_OTHER): Payer: BLUE CROSS/BLUE SHIELD

## 2018-12-05 ENCOUNTER — Encounter (INDEPENDENT_AMBULATORY_CARE_PROVIDER_SITE_OTHER): Payer: Self-pay | Admitting: Vascular Surgery

## 2018-12-05 VITALS — BP 144/81 | HR 73 | Resp 16 | Ht 63.0 in | Wt 167.4 lb

## 2018-12-05 DIAGNOSIS — M79605 Pain in left leg: Secondary | ICD-10-CM

## 2018-12-05 DIAGNOSIS — E782 Mixed hyperlipidemia: Secondary | ICD-10-CM

## 2018-12-05 DIAGNOSIS — J449 Chronic obstructive pulmonary disease, unspecified: Secondary | ICD-10-CM

## 2018-12-05 DIAGNOSIS — Z87891 Personal history of nicotine dependence: Secondary | ICD-10-CM

## 2018-12-05 DIAGNOSIS — I739 Peripheral vascular disease, unspecified: Secondary | ICD-10-CM | POA: Diagnosis not present

## 2018-12-05 DIAGNOSIS — Z7901 Long term (current) use of anticoagulants: Secondary | ICD-10-CM

## 2018-12-05 DIAGNOSIS — Z95820 Peripheral vascular angioplasty status with implants and grafts: Secondary | ICD-10-CM | POA: Diagnosis not present

## 2018-12-05 DIAGNOSIS — I10 Essential (primary) hypertension: Secondary | ICD-10-CM

## 2018-12-05 DIAGNOSIS — Z79899 Other long term (current) drug therapy: Secondary | ICD-10-CM

## 2018-12-05 NOTE — Progress Notes (Signed)
MRN : [id number]  Tina Page is a 63 y.o. (02-Jul-1956) female who presents with chief complaint of No chief complaint on file. [name] Kitchen  History of Present Illness:   The patient returns to the office for followup and review of the noninvasive studies.   She is s/p bilateral iliac artery stents using the "kissing balloon technique" on 07/10/2016.  The patient notes interval development of left groin pain. This is not related to activity, walking is stable.  No new ulcers or wounds have occurred since the last visit.  There have been no significant changes to the patient's overall health care.  The patient denies amaurosis fugax or recent TIA symptoms. There are no recent neurological changes noted. The patient denies history of DVT, PE or superficial thrombophlebitis. The patient denies recent episodes of angina or shortness of breath.   ABI's Rt=1.03 and Lt=1.09 (previous ABI's Rt=1.15 and Lt=1.01)   Current Meds  Medication Sig  . albuterol (PROVENTIL HFA;VENTOLIN HFA) 108 (90 Base) MCG/ACT inhaler Inhale 2 puffs into the lungs every 6 (six) hours as needed for wheezing or shortness of breath.   . allopurinol (ZYLOPRIM) 100 MG tablet TAKE 1 TABLET BY MOUTH EVERY DAY  . clopidogrel (PLAVIX) 75 MG tablet Take 1 tablet (75 mg total) by mouth daily.  [name] Kitchen entecavir (BARACLUDE) 0.5 MG tablet Take 0.5 mg by mouth daily.  . folic acid (FOLVITE) 1 MG tablet TAKE 1 TABLET (1 MG TOTAL) BY MOUTH DAILY.  . hydrochlorothiazide (HYDRODIURIL) 25 MG tablet Take 25 mg by mouth daily.  [name] Kitchen levothyroxine (SYNTHROID, LEVOTHROID) 75 MCG tablet Take 75 mcg by mouth daily before breakfast.   . lisinopril (PRINIVIL,ZESTRIL) 20 MG tablet Take 20 mg by mouth daily.   [name] Kitchen loperamide (IMODIUM) 2 MG capsule Take 2 mg by mouth 2 (two) times daily.  [name] Kitchen omeprazole (PRILOSEC) 20 MG capsule Take 1 capsule (20 mg total) by mouth daily.  . potassium chloride (KLOR-CON) 20 MEQ packet Take 20 mEq by mouth daily.  .  pravastatin (PRAVACHOL) 40 MG tablet Take 40 mg by mouth every evening.    Past Medical History:  Diagnosis Date  . Anemia   . Atherosclerosis of native arteries of extremity with intermittent claudication ([place]) 07/20/2016  . COPD (chronic obstructive pulmonary disease) ([place])   . 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 Elevated liver function tests 11/03/2017  . GERD (gastroesophageal reflux disease)   . Heme positive stool 01/29/2015  . Hepatitis C 07/12/2016  . HLD (hyperlipidemia)   . Hypertension   . Hypothyroidism   . 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 Thrombocytopenia ([place]) 10/04/2016    Past Surgical History:  Procedure Laterality Date  . ELECTROMAGNETIC NAVIGATION BROCHOSCOPY N/A 12/07/2017   Procedure: ELECTROMAGNETIC NAVIGATION BRONCHOSCOPY;  Surgeon: Flora Lipps, MD;  Location: [facility] ORS;  Service: Cardiopulmonary;  Laterality: N/A;  . ESOPHAGOGASTRODUODENOSCOPY (EGD) WITH PROPOFOL N/A 07/08/2016   Procedure: ESOPHAGOGASTRODUODENOSCOPY (EGD) WITH PROPOFOL;  Surgeon: Manya Silvas, MD;  Location[facility] ENDOSCOPY;  Service: Endoscopy;  Laterality: N/A;  . KNEE SURGERY Right    repair of acl tear  . PERIPHERAL VASCULAR CATHETERIZATION N/A 07/10/2016   Procedure: Lower Extremity Angiography;  Surgeon: Katha Cabal, MD;  Location: [place] [facility];  Service: Cardiovascular;  Laterality: N/A;  . PERIPHERAL VASCULAR CATHETERIZATION N/A 07/10/2016   Procedure: Abdominal Aortogram w/Lower Extremity;  Surgeon: Katha Cabal, MD;  Location: [place] [facility];  Service: Cardiovascular;  Laterality: N/A;  . PERIPHERAL VASCULAR CATHETERIZATION  07/10/2016   Procedure: Lower Extremity Intervention;  Surgeon: Katha Cabal, MD;  Location: [place] [facility];  Service: Cardiovascular;;    Social History Social History   Tobacco Use  . Smoking status: Former Smoker    Packs/day: 0.50    Types: Cigarettes    Last attempt to  quit: 07/11/2016    Years since quitting: 2.4  . Smokeless tobacco: Never Used  Substance Use Topics  . Alcohol use: No  . Drug use: No    Family History Family History  Problem Relation Age of Onset  . Diabetes Mother   . Hypertension Mother   . Diabetes Maternal Grandfather   . Hypertension Maternal Grandfather     Allergies  Allergen Reactions  . Codeine Anaphylaxis  . Ciprofloxacin Swelling    Facial swelling following single oral dose.      REVIEW OF SYSTEMS (Negative unless checked)  Constitutional: [] Weight loss  [] Fever  [] Chills Cardiac: [] Chest pain   [] Chest pressure   [] Palpitations   [] Shortness of breath when laying flat   [] Shortness of breath with exertion. Vascular:  [] Pain in legs with walking   [x] Pain in legs at rest  [] History of DVT   [] Phlebitis   [x] Swelling in legs   [] Varicose veins   [] Non-healing ulcers Pulmonary:   [] Uses home oxygen   [] Productive cough   [] Hemoptysis   [] Wheeze  [] COPD   [] Asthma Neurologic:  [] Dizziness   [] Seizures   [] History of stroke   [] History of TIA  [] Aphasia   [] Vissual changes   [] Weakness or numbness in arm   [] Weakness or numbness in leg Musculoskeletal:   [] Joint swelling   [] Joint pain   [] Low back pain Hematologic:  [] Easy bruising  [] Easy bleeding   [] Hypercoagulable state   [] Anemic Gastrointestinal:  [] Diarrhea   [] Vomiting  [] Gastroesophageal reflux/heartburn   [] Difficulty swallowing. Genitourinary:  [] Chronic kidney disease   [] Difficult urination  [] Frequent urination   [] Blood in urine Skin:  [] Rashes   [] Ulcers  Psychological:  [] History of anxiety   []  History of major depression.  Physical Examination  Vitals:   12/05/18 0910  BP: (!) 144/81  Pulse: 73  Resp: 16  Weight: 167 lb 6.4 oz (75.9 kg)  Height: 5\' 3"  (1.6 m)   Body mass index is 29.65 kg/m. Gen: WD/WN, NAD Head: [place]/AT, No temporalis wasting.  Ear/Nose/Throat: Hearing grossly intact, nares w/o erythema or drainage Eyes: PER, EOMI,  sclera nonicteric.  Neck: Supple, no large masses.   Pulmonary:  Good air movement, no audible wheezing bilaterally, no use of accessory muscles.  Cardiac: RRR, no JVD Vascular:  Vessel Right Left  Radial Palpable Palpable  Gastrointestinal: Non-distended. No guarding/no peritoneal signs.  Musculoskeletal: M/S 5/5 throughout.  No deformity or atrophy.  Neurologic: CN 2-12 intact. Symmetrical.  Speech is fluent. Motor exam as listed above. Psychiatric: Judgment intact, Mood & affect appropriate for pt's clinical situation. Dermatologic: No rashes or ulcers noted.  No changes consistent with cellulitis. Lymph : No lichenification or skin changes of chronic lymphedema.  CBC Lab Results  Component Value Date   WBC 3.2 (L) 10/18/2018   HGB 11.3 (L) 10/18/2018   HCT 35.3 (L) 10/18/2018   MCV 98.1 10/18/2018   PLT 125 (L) 10/18/2018    BMET    Component Value Date/Time   NA 142 10/18/2018 0814   NA 142 06/11/2014 1732   K 4.0 10/18/2018 0814   K 3.1 (L)  06/11/2014 1732   CL 105 10/18/2018 0814   CL 103 06/11/2014 1732   CO2 29 10/18/2018 0814   CO2 32 06/11/2014 1732   GLUCOSE 105 (H) 10/18/2018 0814   GLUCOSE 100 (H) 06/11/2014 1732   BUN 25 (H) 10/18/2018 0814   BUN 15 06/11/2014 1732   CREATININE 0.99 10/18/2018 0814   CREATININE 0.73 06/11/2014 1732   CALCIUM 9.4 10/18/2018 0814   CALCIUM 8.9 06/11/2014 1732   GFRNONAA >60 10/18/2018 0814   GFRNONAA >60 06/11/2014 1732   GFRAA >60 10/18/2018 0814   GFRAA >60 06/11/2014 1732   CrCl cannot be calculated (Patient's most recent lab result is older than the maximum 21 days allowed.).  COAG Lab Results  Component Value Date   INR 1.09 07/21/2016    Radiology No results found.   Assessment/Plan 1. Pain of left lower extremity Recommend:  I do not find evidence of Vascular pathology that would explain the patient's symptoms  The patient has atypical pain symptoms for vascular disease  Noninvasive studies  including venous ultrasound of the legs do not identify vascular problems  The patient should continue walking and begin a more formal exercise program. The patient should continue his antiplatelet therapy and aggressive treatment of the lipid abnormalities. The patient should begin wearing graduated compression socks 15-20 mmHg strength to control her mild edema.  Further work-up of her lower extremity pain is deferred to the primary service     2. PAD (peripheral artery disease) (HCC)  Recommend:  The patient has evidence of atherosclerosis of the lower extremities with claudication.  The patient does not voice lifestyle limiting changes at this point in time.  Noninvasive studies do not suggest clinically significant change.  No invasive studies, angiography or surgery at this time The patient should continue walking and begin a more formal exercise program.  The patient should continue antiplatelet therapy and aggressive treatment of the lipid abnormalities  No changes in the patient's medications at this time  The patient should continue wearing graduated compression socks 10-15 mmHg strength to control the mild edema.   - VAS [name] ABI WITH/WO TBI; Future  3. Mixed hyperlipidemia Continue statin as ordered and reviewed, no changes at this time   4. Essential hypertension Continue antihypertensive medications as already ordered, these medications have been reviewed and there are no changes at this time.   5. Chronic obstructive pulmonary disease, unspecified COPD type ([place]) Continue pulmonary medications and aerosols as already ordered, these medications have been reviewed and there are no changes at this time.     Hortencia Pilar, MD  12/05/2018 9:11 AM

## 2019-01-19 ENCOUNTER — Encounter: Admit: 2019-01-19 | Discharge: 2019-01-20 | Payer: PRIVATE HEALTH INSURANCE

## 2019-01-19 DIAGNOSIS — D591 Other autoimmune hemolytic anemias: Principal | ICD-10-CM

## 2019-02-08 MED ORDER — ENTECAVIR 0.5 MG TABLET
ORAL_TABLET | Freq: Every day | ORAL | 1 refills | 0 days | Status: CP
Start: 2019-02-08 — End: 2019-03-21

## 2019-02-14 ENCOUNTER — Other Ambulatory Visit: Payer: Self-pay

## 2019-02-15 ENCOUNTER — Inpatient Hospital Stay: Payer: BC Managed Care – PPO | Attending: Hematology and Oncology

## 2019-02-15 ENCOUNTER — Telehealth: Payer: Self-pay | Admitting: Hematology and Oncology

## 2019-02-15 DIAGNOSIS — Z7902 Long term (current) use of antithrombotics/antiplatelets: Secondary | ICD-10-CM | POA: Insufficient documentation

## 2019-02-15 DIAGNOSIS — Z79899 Other long term (current) drug therapy: Secondary | ICD-10-CM | POA: Diagnosis not present

## 2019-02-15 DIAGNOSIS — Z87891 Personal history of nicotine dependence: Secondary | ICD-10-CM | POA: Diagnosis not present

## 2019-02-15 DIAGNOSIS — K30 Functional dyspepsia: Secondary | ICD-10-CM | POA: Diagnosis not present

## 2019-02-15 DIAGNOSIS — Z885 Allergy status to narcotic agent status: Secondary | ICD-10-CM | POA: Insufficient documentation

## 2019-02-15 DIAGNOSIS — E538 Deficiency of other specified B group vitamins: Secondary | ICD-10-CM | POA: Diagnosis present

## 2019-02-15 DIAGNOSIS — R5382 Chronic fatigue, unspecified: Secondary | ICD-10-CM | POA: Diagnosis not present

## 2019-02-15 DIAGNOSIS — E785 Hyperlipidemia, unspecified: Secondary | ICD-10-CM | POA: Diagnosis not present

## 2019-02-15 DIAGNOSIS — I700 Atherosclerosis of aorta: Secondary | ICD-10-CM | POA: Insufficient documentation

## 2019-02-15 DIAGNOSIS — B181 Chronic viral hepatitis B without delta-agent: Secondary | ICD-10-CM | POA: Insufficient documentation

## 2019-02-15 DIAGNOSIS — Z833 Family history of diabetes mellitus: Secondary | ICD-10-CM | POA: Insufficient documentation

## 2019-02-15 DIAGNOSIS — Z8249 Family history of ischemic heart disease and other diseases of the circulatory system: Secondary | ICD-10-CM | POA: Diagnosis not present

## 2019-02-15 DIAGNOSIS — I10 Essential (primary) hypertension: Secondary | ICD-10-CM | POA: Insufficient documentation

## 2019-02-15 DIAGNOSIS — J449 Chronic obstructive pulmonary disease, unspecified: Secondary | ICD-10-CM | POA: Insufficient documentation

## 2019-02-15 DIAGNOSIS — R1032 Left lower quadrant pain: Secondary | ICD-10-CM | POA: Diagnosis not present

## 2019-02-15 DIAGNOSIS — M329 Systemic lupus erythematosus, unspecified: Secondary | ICD-10-CM | POA: Insufficient documentation

## 2019-02-15 DIAGNOSIS — D696 Thrombocytopenia, unspecified: Secondary | ICD-10-CM | POA: Insufficient documentation

## 2019-02-15 DIAGNOSIS — R7989 Other specified abnormal findings of blood chemistry: Secondary | ICD-10-CM | POA: Diagnosis not present

## 2019-02-15 DIAGNOSIS — K589 Irritable bowel syndrome without diarrhea: Secondary | ICD-10-CM | POA: Insufficient documentation

## 2019-02-15 DIAGNOSIS — D591 Other autoimmune hemolytic anemias: Secondary | ICD-10-CM | POA: Diagnosis not present

## 2019-02-15 DIAGNOSIS — N289 Disorder of kidney and ureter, unspecified: Secondary | ICD-10-CM | POA: Insufficient documentation

## 2019-02-15 DIAGNOSIS — R918 Other nonspecific abnormal finding of lung field: Secondary | ICD-10-CM | POA: Insufficient documentation

## 2019-02-15 DIAGNOSIS — Z881 Allergy status to other antibiotic agents status: Secondary | ICD-10-CM | POA: Diagnosis not present

## 2019-02-15 LAB — COMPREHENSIVE METABOLIC PANEL
ALT: 173 U/L — ABNORMAL HIGH (ref 0–44)
AST: 171 U/L — ABNORMAL HIGH (ref 15–41)
Albumin: 4.3 g/dL (ref 3.5–5.0)
Alkaline Phosphatase: 145 U/L — ABNORMAL HIGH (ref 38–126)
Anion gap: 8 (ref 5–15)
BUN: 30 mg/dL — ABNORMAL HIGH (ref 8–23)
CO2: 26 mmol/L (ref 22–32)
Calcium: 9.3 mg/dL (ref 8.9–10.3)
Chloride: 103 mmol/L (ref 98–111)
Creatinine, Ser: 1.22 mg/dL — ABNORMAL HIGH (ref 0.44–1.00)
GFR calc Af Amer: 55 mL/min — ABNORMAL LOW (ref >60)
GFR calc non Af Amer: 48 mL/min — ABNORMAL LOW (ref >60)
Glucose, Bld: 100 mg/dL — ABNORMAL HIGH (ref 70–99)
Potassium: 4.4 mmol/L (ref 3.5–5.1)
Sodium: 137 mmol/L (ref 135–145)
Total Bilirubin: 1 mg/dL (ref 0.3–1.2)
Total Protein: 7.7 g/dL (ref 6.5–8.1)

## 2019-02-15 LAB — CBC WITH DIFFERENTIAL/PLATELET
Abs Immature Granulocytes: 0.02 10*3/uL (ref 0.00–0.07)
Basophils Absolute: 0 10*3/uL (ref 0.0–0.1)
Basophils Relative: 0 %
Eosinophils Absolute: 0.1 10*3/uL (ref 0.0–0.5)
Eosinophils Relative: 1 %
HCT: 39.1 % (ref 36.0–46.0)
Hemoglobin: 13.1 g/dL (ref 12.0–15.0)
Immature Granulocytes: 0 %
Lymphocytes Relative: 11 %
Lymphs Abs: 0.6 10*3/uL — ABNORMAL LOW (ref 0.7–4.0)
MCH: 31.4 pg (ref 26.0–34.0)
MCHC: 33.5 g/dL (ref 30.0–36.0)
MCV: 93.8 fL (ref 80.0–100.0)
Monocytes Absolute: 0.4 10*3/uL (ref 0.1–1.0)
Monocytes Relative: 7 %
Neutro Abs: 4.3 10*3/uL (ref 1.7–7.7)
Neutrophils Relative %: 81 %
Platelets: 133 10*3/uL — ABNORMAL LOW (ref 150–400)
RBC: 4.17 MIL/uL (ref 3.87–5.11)
RDW: 13.6 % (ref 11.5–15.5)
WBC: 5.3 10*3/uL (ref 4.0–10.5)
nRBC: 0 % (ref 0.0–0.2)

## 2019-02-15 LAB — RETICULOCYTES
Immature Retic Fract: 16.4 % — ABNORMAL HIGH (ref 2.3–15.9)
RBC.: 4.17 MIL/uL (ref 3.87–5.11)
Retic Count, Absolute: 140.1 10*3/uL (ref 19.0–186.0)
Retic Ct Pct: 3.4 % — ABNORMAL HIGH (ref 0.4–3.1)

## 2019-02-15 LAB — LACTATE DEHYDROGENASE: LDH: 245 U/L — ABNORMAL HIGH (ref 98–192)

## 2019-02-15 NOTE — Progress Notes (Signed)
[facility]  [address] [place], [place] [address] Phone: [phone]  Fax: [phone]   Clinic Visit:  02/16/2019  Referring physician: Frazier Richards, MD  Chief Complaint: Tina Page is a 63 y.o. female with a right middle lobe mass, warm autoimmune hemolytic anemia, and renal insufficiency who is seen for 4 month assessment.  HPI:  The patient was last seen in the hematology clinic on 10/18/2018. At that time, she had chronic fatigue.  She denied any fevers or sweats.  She had lost 7-8 pounds.  Exam revealed no adenopathy or hepatosplenomegaly. B12 was 2439.  B12 supplement was discontinued with plans for recheck off supplementation.  She continued entecavir s/p Rituxan.  She was seen via telemedicine by Dr. Gabriel Rainwater in infectious disease. 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 Three top 6 month follow-up was scheduled.   She was seen in vascular surgery on 12/05/2018 by Dr. Delana Meyer for left leg pain. Venous ultrasound of the legs did not identify vascular problems. She was advised to wear compression socks.   Labs on 01/19/2019 at [facility] revealed a hematocrit 36.1, hemoglobin 11.8, MCV 95.8, platelets 126,000, WBC 4400 with an ANC of 2800.  BUN was 22 and creatinine 1.01.  AST was 25, ALT 15, alkaline phosphatase 118 and bilirubin 0.6.  She was contacted yesterday after her labs revealed increased LFTs (AST 171, ALT 173, alkaline phosphatase 145, bilirubin 1.0).  She denied any pain or fever.  She noted symptoms "like food poisoning" with upset stomach.  She was taking her entecavir.   During the interim, the patient says she feels "good."  She reports feeling like she had food poisoning and being bloated about 3 days ago and it only lasted 24 hours. On that day she ate pintos, and squash which was from the garden. She admits to not eating out. She denies, fever, and headaches.   She has pain in the left lower quadrant of her abdomen, which she  attributes to her IBS. She is taking 2 mg of Imodium once daily for her IBS which helps stop her diarrhea. If Imodium is taken twice daily she reports feeling constipated. Her weight is up 8 lbs.    Past Medical History:  Diagnosis Date  . Anemia   . Atherosclerosis of native arteries of extremity with intermittent claudication ([place]) 07/20/2016  . COPD (chronic obstructive pulmonary disease) ([place])   . 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 Elevated liver function tests 11/03/2017  . GERD (gastroesophageal reflux disease)   . Heme positive stool 01/29/2015  . Hepatitis C 07/12/2016  . HLD (hyperlipidemia)   . Hypertension   . Hypothyroidism   . 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 Thrombocytopenia ([place]) 10/04/2016    Past Surgical History:  Procedure Laterality Date  . ELECTROMAGNETIC NAVIGATION BROCHOSCOPY N/A 12/07/2017   Procedure: ELECTROMAGNETIC NAVIGATION BRONCHOSCOPY;  Surgeon: Flora Lipps, MD;  Location: [facility] ORS;  Service: Cardiopulmonary;  Laterality: N/A;  . ESOPHAGOGASTRODUODENOSCOPY (EGD) WITH PROPOFOL N/A 07/08/2016   Procedure: ESOPHAGOGASTRODUODENOSCOPY (EGD) WITH PROPOFOL;  Surgeon: Manya Silvas, MD;  Location: [facility] ENDOSCOPY;  Service: Endoscopy;  Laterality: N/A;  . KNEE SURGERY Right    repair of acl tear  . PERIPHERAL VASCULAR CATHETERIZATION N/A 07/10/2016   Procedure: Lower Extremity Angiography;  Surgeon: Katha Cabal, MD;  Location: [place] CV LAB;  Service: Cardiovascular;  Laterality: N/A;  . PERIPHERAL VASCULAR CATHETERIZATION N/A 07/10/2016  Procedure: Abdominal Aortogram w/Lower Extremity;  Surgeon: Katha Cabal, MD;  Location: [place] CV LAB;  Service: Cardiovascular;  Laterality: N/A;  . PERIPHERAL VASCULAR CATHETERIZATION  07/10/2016   Procedure: Lower Extremity Intervention;  Surgeon: Katha Cabal, MD;  Location: [place] CV LAB;  Service: Cardiovascular;;    Family History   Problem Relation Age of Onset  . Diabetes Mother   . Hypertension Mother   . Diabetes Maternal Grandfather   . Hypertension Maternal Grandfather     Social History:  reports that she quit smoking about 2 years ago. Her smoking use included cigarettes. She smoked 0.50 packs per day. She has never used smokeless tobacco. She reports that she does not drink alcohol or use drugs. She has a 21 pack year smoking history (1/2 pack/day from age [age]).  The patient works at [employer] She is exposed to cold temperatures. She works 3rd shift.  She has a daughter, Tina Page and a daughter named Tina Page.  She lives in [place].  She is alone today.   Allergies:  Allergies  Allergen Reactions  . Codeine Anaphylaxis  . Ciprofloxacin Swelling    Facial swelling following single oral dose.     Current Medications: Current Outpatient Medications  Medication Sig Dispense Refill  . albuterol (PROVENTIL HFA;VENTOLIN HFA) 108 (90 Base) MCG/ACT inhaler Inhale 2 puffs into the lungs every 6 (six) hours as needed for wheezing or shortness of breath.     . allopurinol (ZYLOPRIM) 100 MG tablet TAKE 1 TABLET BY MOUTH EVERY DAY 90 tablet 1  . clopidogrel (PLAVIX) 75 MG tablet Take 1 tablet (75 mg total) by mouth daily. 30 tablet 5  . entecavir (BARACLUDE) 0.5 MG tablet Take 0.5 mg by mouth daily.    . folic acid (FOLVITE) 1 MG tablet TAKE 1 TABLET (1 MG TOTAL) BY MOUTH DAILY. 90 tablet 1  . hydrochlorothiazide (HYDRODIURIL) 25 MG tablet Take 25 mg by mouth daily.    [name] Kitchen levothyroxine (SYNTHROID, LEVOTHROID) 75 MCG tablet Take 75 mcg by mouth daily before breakfast.     . lisinopril (PRINIVIL,ZESTRIL) 20 MG tablet Take 20 mg by mouth daily.     [name] Kitchen loperamide (IMODIUM) 2 MG capsule Take 2 mg by mouth 2 (two) times daily.    [name] Kitchen omeprazole (PRILOSEC) 20 MG capsule Take 1 capsule (20 mg total) by mouth daily. 60 capsule 0  . potassium chloride (KLOR-CON) 20 MEQ packet Take 20 mEq by mouth daily.    .  pravastatin (PRAVACHOL) 40 MG tablet Take 40 mg by mouth every evening.    . cyanocobalamin 500 MCG tablet Take 500 mcg by mouth daily.     No current facility-administered medications for this visit.     Review of Systems  Constitutional: Positive for malaise/fatigue (chronic). Negative for chills, diaphoresis, fever and weight loss (up 8lbs).       Feels "good".  HENT: Negative.  Negative for congestion, hearing loss, sinus pain and sore throat.   Eyes: Negative.  Negative for blurred vision.  Respiratory: Negative.  Negative for cough, shortness of breath and wheezing.   Cardiovascular: Negative.  Negative for chest pain, palpitations, orthopnea, leg swelling and PND.  Gastrointestinal: Positive for constipation and diarrhea. Negative for abdominal pain, blood in stool, melena, nausea and vomiting.       IBS. Feeling bloated.  Genitourinary: Negative.  Negative for dysuria, frequency, hematuria and urgency.  Musculoskeletal: Negative.  Negative for back pain, joint pain and myalgias.  Skin: Negative.  Negative for rash.  Neurological: Negative.  Negative for dizziness, tingling, sensory change, weakness and headaches.  Endo/Heme/Allergies: Negative.  Does not bruise/bleed easily.  Psychiatric/Behavioral: Negative.  Negative for depression and memory loss. The patient is not nervous/anxious and does not have insomnia.   All other systems reviewed and are negative.   Performance status (ECOG): 1  Physical Exam  Constitutional: She is oriented to person, place, and time. She appears well-developed and well-nourished. No distress.  HENT:  Head: Normocephalic and atraumatic.  Mouth/Throat: Oropharynx is clear and moist. No oropharyngeal exudate.  Gray hair.  Eyes: Pupils are equal, round, and reactive to light. Conjunctivae and EOM are normal. No scleral icterus.  Glasses.  Blue eyes.   Neck: Normal range of motion. Neck supple. No JVD present.  Cardiovascular: Normal rate, regular  rhythm and normal heart sounds.  No murmur heard. Pulmonary/Chest: Effort normal and breath sounds normal. No respiratory distress. She has no wheezes.  Abdominal: Soft. Bowel sounds are normal. She exhibits no distension and no mass. There is abdominal tenderness (mild) in the left lower quadrant. There is no rebound and no guarding.  Musculoskeletal: Normal range of motion.        General: No tenderness or edema.  Lymphadenopathy:    She has no cervical adenopathy.    She has no axillary adenopathy.       Right: No supraclavicular adenopathy present.       Left: No supraclavicular adenopathy present.  Neurological: She is alert and oriented to person, place, and time.  Skin: Skin is warm and dry. No rash noted. She is not diaphoretic. No erythema. No pallor.  Psychiatric: She has a normal mood and affect. Her behavior is normal. Judgment and thought content normal.  Nursing note and vitals reviewed.   Appointment on 02/15/2019  Component Date Value Ref Range Status  . Haptoglobin 02/15/2019 45  37 - 355 mg/dL Final   Comment: (NOTE) Performed At: [facility] [place], [place] [id number] Rush Farmer MD JQ:[id number]   . Retic Ct Pct 02/15/2019 3.4* 0.4 - 3.1 % Final  . RBC. 02/15/2019 4.17  3.87 - 5.11 MIL/uL Final  . Retic Count, Absolute 02/15/2019 140.1  19.0 - 186.0 K/uL Final  . Immature Retic Fract 02/15/2019 16.4* 2.3 - 15.9 % Final   Performed at [facility], [address] [place], [place] [address]  . LDH 02/15/2019 245* 98 - 192 U/L Final   Performed at [facility], [address] [place], [place] [address]  . Sodium 02/15/2019 137  135 - 145 mmol/L Final  . Potassium 02/15/2019 4.4  3.5 - 5.1 mmol/L Final  . Chloride 02/15/2019 103  98 - 111 mmol/L Final  . CO2 02/15/2019 26  22 - 32 mmol/L Final  . Glucose, Bld 02/15/2019 100* 70 - 99 mg/dL Final  . BUN 02/15/2019 30* 8 - 23 mg/dL Final  . Creatinine, Ser  02/15/2019 1.22* 0.44 - 1.00 mg/dL Final  . Calcium 02/15/2019 9.3  8.9 - 10.3 mg/dL Final  . Total Protein 02/15/2019 7.7  6.5 - 8.1 g/dL Final  . Albumin 02/15/2019 4.3  3.5 - 5.0 g/dL Final  . AST 02/15/2019 171* 15 - 41 U/L Final  . ALT 02/15/2019 173* 0 - 44 U/L Final  . Alkaline Phosphatase 02/15/2019 145* 38 - 126 U/L Final  . Total Bilirubin 02/15/2019 1.0  0.3 - 1.2 mg/dL Final  . GFR calc non Af Amer 02/15/2019 48* >60  mL/min Final  . GFR calc Af Amer 02/15/2019 55* >60 mL/min Final  . Anion gap 02/15/2019 8  5 - 15 Final   Performed at [facility], [address] [place], [place] [address]  . WBC 02/15/2019 5.3  4.0 - 10.5 K/uL Final  . RBC 02/15/2019 4.17  3.87 - 5.11 MIL/uL Final  . Hemoglobin 02/15/2019 13.1  12.0 - 15.0 g/dL Final  . HCT 02/15/2019 39.1  36.0 - 46.0 % Final  . MCV 02/15/2019 93.8  80.0 - 100.0 fL Final  . MCH 02/15/2019 31.4  26.0 - 34.0 pg Final  . MCHC 02/15/2019 33.5  30.0 - 36.0 g/dL Final  . RDW 02/15/2019 13.6  11.5 - 15.5 % Final  . Platelets 02/15/2019 133* 150 - 400 K/uL Final  . nRBC 02/15/2019 0.0  0.0 - 0.2 % Final  . Neutrophils Relative % 02/15/2019 81  % Final  . Neutro Abs 02/15/2019 4.3  1.7 - 7.7 K/uL Final  . Lymphocytes Relative 02/15/2019 11  % Final  . Lymphs Abs 02/15/2019 0.6* 0.7 - 4.0 K/uL Final  . Monocytes Relative 02/15/2019 7  % Final  . Monocytes Absolute 02/15/2019 0.4  0.1 - 1.0 K/uL Final  . Eosinophils Relative 02/15/2019 1  % Final  . Eosinophils Absolute 02/15/2019 0.1  0.0 - 0.5 K/uL Final  . Basophils Relative 02/15/2019 0  % Final  . Basophils Absolute 02/15/2019 0.0  0.0 - 0.1 K/uL Final  . Immature Granulocytes 02/15/2019 0  % Final  . 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 One week prior to presentation, she felt like she was coming down with something. She denied any fever, runny nose, sore throat or cough. She had some diarrhea. She denied any new medications or herbal products.  Anemia workup revealed a cold autoantibody (IgG and complement).  Reticulocyte count was 11.3%.  Ferritin was 562. Iron studies revealed a saturation of 20% and a TIBC of 214 (low). B12 was 231 (low normal). Folate was 42.   Peripheral smear revealed rouleaux formation.  Labs on 02/18/2018 revealed normal flow cytometry, SPEP, and immunoglobulins.  Additional testing included the following + studies:  hepatitis C antibody, hepatitis B core antibody, CMV IgM, and EBV VCA (IgM and IgG). Hepatitis B by PCR was negative.  Hepatitis C RNA was negative.  Mycoplasma pneumonia IgM was negative.  Reticulocyte count was 11.3% (high) indicating appropriate marrow response. C3 and C4 were normal. Cold agglutinin titer was negative x 2.  Negative studies included:  ANA, hepatitis B surface antigen, SPEP, and free light chain ratio.  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  MMA was initially 330 (normal).  Repeat MMA was elevated on 08/01/2016 confirming B12 deficiency.  Hepatitis B surface antibody was positive and hepatitis B surface antigen was negative on 08/06/2016.  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 There was no adenopathy. Spleen was normal.  Abdominal ultrasound on 04/15/2017 revealed a normal spleen and sludge in the gallbladder.  The liver was echogenic consistent fatty infiltration and/or  hepatocellular disease.  She underwent PTCA and stent placement in right and left common iliac arteries and left  external iliac artery on 07/10/2016.  She is on Plavix.  EGD on 03/19/2015 revealed gastritis in the body and antrum. Colonoscopy on 03/19/2015 revealed one hyperplastic polyp. EGD on 07/08/2016 was normal. No evidence of bleeding.  She has received 6 units of warmed PRBCs to date (last on 08/01/2016). If Rituxan is needed, she requires entecavir 0.5 mg q day beginning 2 weeks prior to Rituxan. In addition, hepatitis B viral level and LFTs should be checked monthly.  She began steroids (1 mg/kg) on 08/01/2016.  She stopped prednisone on 03/12/2017.  She is on folic acid 1 mg a day.    She has B12 deficiency.  B12 was 231 on 07/09/2016.  She is on oral B12.  B12 and folate were normal on 03/09/2017 and 10/12/2017.  She developed peri-oral and intranasal herpes simplex-1.  She was treated with valacyclovir and doxycycline on 10/19/2016.  She developed a transient elevated alkaline phosphatase on 10/19/2016 which subsequently normalized.  She was documented to have a warm autoantibody on 10/12/2017.  LDH and bilirubin were normal.  Hematocrit had improved from prior testing.    She developed flu like symptoms on 10/26/2017.  Symptoms included cough, myalgias, and fever (tmax unknown).  She was prescribed Mucinex, Tamiflu, and amoxicillin.  She only took amoxicillin x 5 days.  She developed increased liver function tests on 11/02/2017. CMV IgG was positive.  EBV VCA IgG, NA IgG, early antigen antibody IgG were elevated.  EBV VCA IgM was < 36.  Testing was c/w a convalescence/past infection or reactivated infection.  LFTs normalized on 11/15/2017.  Smooth muscle antibody was 39 (high) on 11/10/2017 and 34 (high) on 11/30/2017.   Abdomen and pelvic CT on 11/10/2017 revealed a 2.2 cm spiculated density in right middle lobe concerning  for malignancy.   Chest CT on 11/16/2017 revealed a 2.2 x 2.0 x 2.4 spiculated RIGHT middle lobe mass.  There was tiny nonspecific upper lobe pulmonary nodules greater  on RIGHT, largest 3 mm, of uncertain etiology.  There was additional 8 x 8 mm opacity in the posterior sulcus of the RIGHT lower lobe.  PET scan on 11/22/2017 revealed a 2 cm hypermetabolic right middle lobe lung mass (SUV 3.4) consistent  with primary lung neoplasm.  There were no findings for mediastinal/hilar lymphadenopathy or metastatic disease.  There were areas of hypermetabolism involving the left oblique abdominal muscles and the anorectal junction.  PFTs on 11/18/2017 revealed an FEV1 of 1.22 liters (51%).  DLCO adj was 6.8 mL/mmHg/min (32%).  ENB done on 12/07/2017. Cytology was negative for malignancy. Pathology demonstrated organizing pneumonia and chronic bronchitis. Pathologist commented that organizing pneumonia spanned about 2 mm in one fragment. Cases of focal organizing pneumonia with hypermetabolism on FDG-PET have been described, but changes of this type can also be adjacent to a neoplasm. Patient prescribed a daily dose of Prednisone 10 mg, with re-imaging planned in 6-8 weeks.   Chest CT on 02/06/2018 revealed the spiculated 2.2 cm right middle lobe lesion was almost completely resolved with only a residual 4 mm irregular nodule identified at this location on a background of architectural distortion/evolving scar. There was a 5 mm right parahilar nodule stable since 11/16/2017 (not present on 07/07/2016).  There were tiny nodules in both upper lobes suggesting inhalation etiology.  She began prednisone 10 mg on 12/07/2017 (discontinued 03/2018).  She received Rituxan weekly x 4 (last dose  06/16/2018).  She has diarrhea.  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  She received azithromycin x 3 days.  She received ciprofloxacin x 10 days without improvement.  She has seen ID in [place].  She began Bactrim on 03/04/2018 (discontinued on 03/11/2018) secondary to increase in creatine.  She has renal insufficiency.  Creatinine has been followed:   1.17 on 11/05/2017, 1.85 on 11/09/2017, 1.38 on 11/12/2017, 1.74 on 11/15/2017, 1.66 on 11/17/2017, and 1.48 on 11/23/2017.  Urinalysis on 11/15/2017 revealed revealed no hemoglobin, bilirubin or active sediment.  Symptomatically, she has had some LLQ pain attributed to IBS.  Exam reveals a slightly tender abdomen without guarding or rebound tenderness.  Liver function tests are elevated (AST 171 and ALT 173).  Plan: 1.   Review labs from 02/15/2019. 2.   Labs today: hepatitis A antibody, hepatitis B surface antigen, hepatitis B E antigen, hepatitis B E antibody, hepatitis B viral level, B12, folate. 3.   Hemolytic anemia       Hematocrit 39.1.  Hemoglobin 13.1.  LDH 245.       Counts stable.    Patient completed steroids on 03/2018 and Rituxan on 06/16/2018.    Continue to monitor. 4.   Hepatitis B       Patient previously exposed to hepatitis B.  She continues entecavir.  Patient aware that she will be on anti-HBV prophylaxis for 1 year s/p last dose of Rituxan.   If creatinine > 1.3, entecavir renally adjusted.   5.   Elevated liver function tests  Etiology of elevated liver function test unclear.  Patient notes abnormal GI symptoms 3 days ago.    Concern for reactivation of hepatitis B, new hepatitis A infection or other etiology.  Work-up sent today (discussed with Dr Allen Norris). 6.   B12 deficiency             B12 443 and folate 80.5 today.  Continue to monitor 7.   Right middle lobe nodule  Patient noted to have a resolving 2.2 cm RML nodule on 02/06/2018 chest CT.             Consider follow-up imaging. 8.     RTC in 1 week for MD assessment and labs (CBC with differential, CMP).  I discussed the assessment and treatment plan with the patient.  The patient was provided an opportunity to ask questions and all were answered.  The patient agreed with the plan and demonstrated an understanding of the instructions.  The patient was advised to call back if the symptoms worsen or if the  condition fails to improve as anticipated.   Nolon Stalls, MD, PhD  02/16/2019, 3:29 PM  I, Molly Dorshimer, am acting as Education administrator for [employer]. Mike Gip, MD, PhD.  I, Melissa C. Mike Gip, MD, have reviewed the above documentation for accuracy and completeness, and I agree with the above.

## 2019-02-16 ENCOUNTER — Inpatient Hospital Stay: Payer: BC Managed Care – PPO

## 2019-02-16 ENCOUNTER — Other Ambulatory Visit: Payer: Self-pay

## 2019-02-16 ENCOUNTER — Inpatient Hospital Stay: Payer: BC Managed Care – PPO | Admitting: Hematology and Oncology

## 2019-02-16 ENCOUNTER — Other Ambulatory Visit: Payer: Self-pay | Admitting: Hematology and Oncology

## 2019-02-16 ENCOUNTER — Other Ambulatory Visit: Payer: BLUE CROSS/BLUE SHIELD

## 2019-02-16 ENCOUNTER — Inpatient Hospital Stay (HOSPITAL_BASED_OUTPATIENT_CLINIC_OR_DEPARTMENT_OTHER): Payer: BC Managed Care – PPO | Admitting: Hematology and Oncology

## 2019-02-16 ENCOUNTER — Encounter: Payer: Self-pay | Admitting: Hematology and Oncology

## 2019-02-16 VITALS — BP 150/86 | HR 81 | Temp 97.5°F | Resp 18 | Wt 175.7 lb

## 2019-02-16 DIAGNOSIS — D696 Thrombocytopenia, unspecified: Secondary | ICD-10-CM

## 2019-02-16 DIAGNOSIS — R7989 Other specified abnormal findings of blood chemistry: Secondary | ICD-10-CM

## 2019-02-16 DIAGNOSIS — R768 Other specified abnormal immunological findings in serum: Secondary | ICD-10-CM

## 2019-02-16 DIAGNOSIS — D591 Autoimmune hemolytic anemia, unspecified: Secondary | ICD-10-CM

## 2019-02-16 DIAGNOSIS — E538 Deficiency of other specified B group vitamins: Secondary | ICD-10-CM | POA: Diagnosis not present

## 2019-02-16 DIAGNOSIS — R945 Abnormal results of liver function studies: Secondary | ICD-10-CM

## 2019-02-16 LAB — HAPTOGLOBIN: Haptoglobin: 45 mg/dL (ref 37–355)

## 2019-02-16 LAB — FOLATE: Folate: 80.5 ng/mL (ref >5.9)

## 2019-02-17 LAB — HEPATITIS A ANTIBODY, TOTAL: hep A Total Ab: NEGATIVE

## 2019-02-17 LAB — VITAMIN B12: Vitamin B-12: 443 pg/mL (ref 180–914)

## 2019-02-17 LAB — HEPATITIS B SURFACE ANTIGEN: Hepatitis B Surface Ag: NEGATIVE

## 2019-02-17 LAB — HEPATITIS A ANTIBODY, IGM: Hep A IgM: NEGATIVE

## 2019-02-17 LAB — HEPATITIS B E ANTIGEN: Hep B E Ag: NEGATIVE

## 2019-02-18 LAB — HEPATITIS B DNA, ULTRAQUANTITATIVE, PCR
HBV DNA SERPL PCR-ACNC: NOT DETECTED IU/mL
HBV DNA SERPL PCR-LOG IU: UNDETERMINED log10 IU/mL

## 2019-02-21 LAB — HEPATITIS B E ANTIBODY: Hep B E Ab: POSITIVE — AB

## 2019-02-21 NOTE — Progress Notes (Signed)
[facility]  [address] [place], [place] [address] Phone: [phone]  Fax: [phone]   Clinic Day:  02/22/2019  Referring physician: Frazier Richards, MD  Chief Complaint: Tina Page is a 63 y.o. female with a right middle lobe mass, warm autoimmune hemolytic anemia, and renal insufficiency who is seen for 1 week assessment secondary to increased liver function tests.  HPI: The patient was last seen in the hematology clinic on 02/16/2019. At that time, she had some LLQ pain attributed to IBS.  Exam revealed a slightly tender abdomen without guarding or rebound tenderness.  Liver function tests were elevated (AST 171 and ALT 173).  Labs on 02/16/2019 revealed hepatitis B E antibody positive.  Negative studies included: hepatitis A total antibody, hepatitis A IgM, hepatitis B surface antigen, hepatitis B E antigen.  Hepatitis B DNA was not detected.  Folate was 80.5 and vitamin B12 was 443.  During the interim, the patient says "I'm fine." Her symptoms are unchanged. She reports abdominal pain in her LLQ near her bladder for 3 weeks. She take two 500 mg of tylenol a day for pain. She has not taken any Tylenol in the last couple days.   Past Medical History:  Diagnosis Date  . Anemia   . Atherosclerosis of native arteries of extremity with intermittent claudication ([place]) 07/20/2016  . COPD (chronic obstructive pulmonary disease) ([place])   . 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 Elevated liver function tests 11/03/2017  . GERD (gastroesophageal reflux disease)   . Heme positive stool 01/29/2015  . Hepatitis C 07/12/2016  . HLD (hyperlipidemia)   . Hypertension   . Hypothyroidism   . 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 Thrombocytopenia ([place]) 10/04/2016    Past Surgical History:  Procedure Laterality Date  . ELECTROMAGNETIC NAVIGATION BROCHOSCOPY N/A 12/07/2017   Procedure: ELECTROMAGNETIC NAVIGATION  BRONCHOSCOPY;  Surgeon: Flora Lipps, MD;  Location: [facility] ORS;  Service: Cardiopulmonary;  Laterality: N/A;  . ESOPHAGOGASTRODUODENOSCOPY (EGD) WITH PROPOFOL N/A 07/08/2016   Procedure: ESOPHAGOGASTRODUODENOSCOPY (EGD) WITH PROPOFOL;  Surgeon: Manya Silvas, MD;  Location: [facility] ENDOSCOPY;  Service: Endoscopy;  Laterality: N/A;  . KNEE SURGERY Right    repair of acl tear  . PERIPHERAL VASCULAR CATHETERIZATION N/A 07/10/2016   Procedure: Lower Extremity Angiography;  Surgeon: Katha Cabal, MD;  Location: [place] [facility];  Service: Cardiovascular;  Laterality: N/A;  . PERIPHERAL VASCULAR CATHETERIZATION N/A 07/10/2016   Procedure: Abdominal Aortogram w/Lower Extremity;  Surgeon: Katha Cabal, MD;  Location: [place] [facility];  Service: Cardiovascular;  Laterality: N/A;  . PERIPHERAL VASCULAR CATHETERIZATION  07/10/2016   Procedure: Lower Extremity Intervention;  Surgeon: Katha Cabal, MD;  Location: [place] [facility];  Service: Cardiovascular;;    Family History  Problem Relation Age of Onset  . Diabetes Mother   . Hypertension Mother   . Diabetes Maternal Grandfather   . Hypertension Maternal Grandfather     Social History:  reports that she quit smoking about 2 years ago. Her smoking use included cigarettes. She smoked 0.50 packs per day. She has never used smokeless tobacco. She reports that she does not drink alcohol or use drugs. She has a 21 pack year smoking history (1/2 pack/day from age [age]). The patient works at [employer] She is exposed to cold temperatures. She works 3rd shift. She has a daughter, [name] and a daughter named [name]. She lives in [place].The  patient is alone today.  Allergies:  Allergies  Allergen Reactions  . Codeine Anaphylaxis  . Ciprofloxacin Swelling    Facial swelling following single oral dose.     Current Medications: Current Outpatient Medications  Medication Sig Dispense Refill  . albuterol  (PROVENTIL HFA;VENTOLIN HFA) 108 (90 Base) MCG/ACT inhaler Inhale 2 puffs into the lungs every 6 (six) hours as needed for wheezing or shortness of breath.     . allopurinol (ZYLOPRIM) 100 MG tablet TAKE 1 TABLET BY MOUTH EVERY DAY 90 tablet 1  . clopidogrel (PLAVIX) 75 MG tablet Take 1 tablet (75 mg total) by mouth daily. 30 tablet 5  . cyanocobalamin 500 MCG tablet Take 500 mcg by mouth daily.    [name] Kitchen entecavir (BARACLUDE) 0.5 MG tablet Take 0.5 mg by mouth daily.    . folic acid (FOLVITE) 1 MG tablet TAKE 1 TABLET (1 MG TOTAL) BY MOUTH DAILY. 90 tablet 1  . hydrochlorothiazide (HYDRODIURIL) 25 MG tablet Take 25 mg by mouth daily.    [name] Kitchen levothyroxine (SYNTHROID, LEVOTHROID) 75 MCG tablet Take 75 mcg by mouth daily before breakfast.     . lisinopril (PRINIVIL,ZESTRIL) 20 MG tablet Take 20 mg by mouth daily.     [name] Kitchen loperamide (IMODIUM) 2 MG capsule Take 2 mg by mouth 2 (two) times daily.    [name] Kitchen omeprazole (PRILOSEC) 20 MG capsule Take 1 capsule (20 mg total) by mouth daily. 60 capsule 0  . potassium chloride (KLOR-CON) 20 MEQ packet Take 20 mEq by mouth daily.    . pravastatin (PRAVACHOL) 40 MG tablet Take 40 mg by mouth every evening.     No current facility-administered medications for this visit.     Review of Systems  Constitutional: Positive for malaise/fatigue (chronic). Negative for chills, fever and weight loss (up 1 lbs.).       "I'm fine."  HENT: Negative for congestion, ear pain, hearing loss and sore throat.   Eyes: Negative for blurred vision.  Respiratory: Negative for cough, hemoptysis, shortness of breath and stridor.   Cardiovascular: Negative for chest pain, palpitations and leg swelling.  Gastrointestinal: Positive for abdominal pain (LLQ (bladder)), constipation (IBS, feeling bloated) and diarrhea. Negative for heartburn, nausea and vomiting.  Genitourinary: Negative for dysuria and urgency.  Musculoskeletal: Negative for back pain, joint pain and myalgias.  Skin: Negative for  rash.  Neurological: Negative for dizziness, tingling, sensory change, weakness and headaches.  Endo/Heme/Allergies: Does not bruise/bleed easily.  Psychiatric/Behavioral: Negative for depression and memory loss. The patient is not nervous/anxious and does not have insomnia.   All other systems reviewed and are negative.  Performance status (ECOG): 1  Blood pressure (!) 151/88, pulse 77, temperature 98 F (36.7 C), temperature source Oral, resp. rate 16, weight 176 lb 12.9 oz (80.2 kg), SpO2 100 %.  Physical Exam  Constitutional: She is oriented to person, place, and time. She appears well-developed and well-nourished. No distress.  HENT:  Head: Normocephalic and atraumatic.  Gray hair.  Eyes: Conjunctivae and EOM are normal. No scleral icterus.  Glasses, blue eyes.  Cardiovascular: Normal rate, regular rhythm and normal heart sounds.  No murmur heard. Pulmonary/Chest: Effort normal and breath sounds normal. No respiratory distress. She has no wheezes. She has no rales.  Abdominal: Soft. Bowel sounds are normal. She exhibits no distension and no mass. There is abdominal tenderness (mild lower quadrant). There is no rebound and no guarding.  Genitourinary:    Genitourinary Comments: No CVA tenderness.   Musculoskeletal: Normal range of  motion.        General: No edema.  Neurological: She is alert and oriented to person, place, and time.  Skin: No rash noted. She is not diaphoretic. No erythema. No pallor.  Psychiatric: She has a normal mood and affect. Her behavior is normal. Judgment and thought content normal.  Nursing note and vitals reviewed.   Appointment on 02/22/2019  Component Date Value Ref Range Status  . Sodium 02/22/2019 139  135 - 145 mmol/L Final  . Potassium 02/22/2019 4.4  3.5 - 5.1 mmol/L Final  . Chloride 02/22/2019 104  98 - 111 mmol/L Final  . CO2 02/22/2019 27  22 - 32 mmol/L Final  . Glucose, Bld 02/22/2019 103* 70 - 99 mg/dL Final  . BUN 02/22/2019 27* 8 -  23 mg/dL Final  . Creatinine, Ser 02/22/2019 1.11* 0.44 - 1.00 mg/dL Final  . Calcium 02/22/2019 9.2  8.9 - 10.3 mg/dL Final  . Total Protein 02/22/2019 7.3  6.5 - 8.1 g/dL Final  . Albumin 02/22/2019 4.2  3.5 - 5.0 g/dL Final  . AST 02/22/2019 19  15 - 41 U/L Final  . ALT 02/22/2019 27  0 - 44 U/L Final  . Alkaline Phosphatase 02/22/2019 100  38 - 126 U/L Final  . Total Bilirubin 02/22/2019 0.3  0.3 - 1.2 mg/dL Final  . GFR calc non Af Amer 02/22/2019 54* >60 mL/min Final  . GFR calc Af Amer 02/22/2019 >60  >60 mL/min Final  . Anion gap 02/22/2019 8  5 - 15 Final   Performed at [facility], [address], [place], [place] [address]  . WBC 02/22/2019 3.9* 4.0 - 10.5 K/uL Final  . RBC 02/22/2019 3.69* 3.87 - 5.11 MIL/uL Final  . Hemoglobin 02/22/2019 11.7* 12.0 - 15.0 g/dL Final  . HCT 02/22/2019 35.2* 36.0 - 46.0 % Final  . MCV 02/22/2019 95.4  80.0 - 100.0 fL Final  . MCH 02/22/2019 31.7  26.0 - 34.0 pg Final  . MCHC 02/22/2019 33.2  30.0 - 36.0 g/dL Final  . RDW 02/22/2019 13.2  11.5 - 15.5 % Final  . Platelets 02/22/2019 123* 150 - 400 K/uL Final  . nRBC 02/22/2019 0.0  0.0 - 0.2 % Final  . Neutrophils Relative % 02/22/2019 63  % Final  . Neutro Abs 02/22/2019 2.4  1.7 - 7.7 K/uL Final  . Lymphocytes Relative 02/22/2019 22  % Final  . Lymphs Abs 02/22/2019 0.9  0.7 - 4.0 K/uL Final  . Monocytes Relative 02/22/2019 10  % Final  . Monocytes Absolute 02/22/2019 0.4  0.1 - 1.0 K/uL Final  . Eosinophils Relative 02/22/2019 3  % Final  . Eosinophils Absolute 02/22/2019 0.1  0.0 - 0.5 K/uL Final  . Basophils Relative 02/22/2019 2  % Final  . Basophils Absolute 02/22/2019 0.1  0.0 - 0.1 K/uL Final  . Immature Granulocytes 02/22/2019 0  % Final  . Abs Immature Granulocytes 02/22/2019 0.01  0.00 - 0.07 K/uL Final   Performed at [facility], [address], [place], [place] [address]    Assessment:  AILYNE PAWLEY is a 63 y.o. 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 One week prior to presentation, she felt like she was coming down with something. She denied any fever, runny nose, sore throat or cough. She had some diarrhea. She denied any new medications or herbal products.  Anemia workuprevealed a cold autoantibody (IgG and  complement). Reticulocyte count was 11.3%. Ferritin was 562. Iron studies revealed a saturation of 20% and a TIBC of 214 (low). B12 was 231 (low normal). Folate was 42. Peripheral smear revealed rouleaux formation. Labs on 06/21/2019revealed normal flow cytometry, SPEP, and immunoglobulins.  Additional testingincluded the following+ studies: hepatitis C antibody,hepatitis B core antibody,CMV IgM, and EBV VCA (IgM and IgG). Hepatitis B by PCRwas negative. Hepatitis C RNAwas negative. Mycoplasma pneumonia IgM was negative. Reticulocyte count was 11.3% (high) indicating appropriate marrow response. C3 and C4 were normal. Cold agglutinin titer was negative x 2. Negative studies included: ANA, hepatitis B surface antigen, SPEP, and free light chain ratio. 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 MMA was initially 330 (normal). Repeat MMA was elevated on 08/01/2016 confirming B12 deficiency. Hepatitis B surface antibody was positive and hepatitis B surface antigen was negative on 08/06/2016.  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 There was no adenopathy. Spleen was normal. Abdominal ultrasoundon 04/15/2017 revealed a normal spleen and sludge in the gallbladder. The liver was echogenic consistent fatty infiltration and/or hepatocellular disease.  She underwent PTCA and stent placementin right and  left common iliac arteries and left external iliac artery on 07/10/2016.She is on Plavix.EGD on 03/19/2015 revealed gastritis in the body and antrum. Colonoscopyon 03/19/2015 revealed one hyperplastic polyp. EGDon 07/08/2016 was normal. No evidence of bleeding.  She has received6 units of warmed PRBCsto date (last on 08/01/2016).If Rituxan is needed, she requires entecavir 0.5 mg q day beginning 2 weeks prior to Rituxan. In addition, hepatitis B viral level and LFTs should be checked monthly.  She began steroids(1 mg/kg) on 08/01/2016. She stopped prednisoneon 03/12/2017. She is onfolic acid1 mg a day.   She hasB12 deficiency. B12 was 231 on 07/09/2016. She is on oral B12. B12 and folate were normal on 03/09/2017 and 10/12/2017.  She developed peri-oral and intranasalherpes simplex-1. She was treated with valacyclovir and doxycycline on 10/19/2016. She developed a transient elevated alkaline phosphataseon 10/19/2016 which subsequently normalized.  She was documented to have awarm autoantibodyon 10/12/2017. LDH and bilirubin were normal. Hematocrit had improved from prior testing.   She developedflu like symptomson 10/26/2017. Symptoms included cough, myalgias, and fever (tmax unknown). She was prescribed Mucinex, Tamiflu, and amoxicillin. She only took amoxicillin x 5 days. She developedincreased liver function testson 11/02/2017. CMV IgG was positive. EBV VCA IgG, NA IgG, early antigen antibody IgG were elevated. EBV VCA IgM was <36. Testing was c/w a convalescence/past infection or reactivated infection. LFTs normalized on 11/15/2017. Smooth muscle antibodywas 39 (high) on 11/10/2017 and 34 (high) on 11/30/2017.   Abdomen and pelvic CTon 11/10/2017 revealed a 2.2 cm spiculated density in right middle lobe concerning for malignancy. Chest CTon 11/16/2017 revealed a 2.2 x 2.0 x 2.4 spiculated RIGHT middle lobe mass. There was tiny nonspecific  upper lobe pulmonary nodules greater on RIGHT, largest 3 mm, of uncertain etiology. There was additional 8 x 8 mm opacity in the posterior sulcus of the RIGHT lower lobe.  PET scanon 11/22/2017 revealed a 2 cm hypermetabolic right middle lobe lung mass (SUV 3.4) consistent with primary lung neoplasm. There were no findings for mediastinal/hilar lymphadenopathy or metastatic disease. There were areas of hypermetabolism involving the left oblique abdominal muscles and the anorectal junction.  PFTs on 11/18/2017 revealed an FEV1 of 1.22 liters (51%). DLCO adj was 6.8 mL/mmHg/min (  32%).  ENB done on 12/07/2017. Cytology was negative for malignancy. Pathologydemonstrated organizing pneumonia and chronic bronchitis. Pathologist commented that organizing pneumonia spanned about 2 mm in one fragment. Cases of focal organizing pneumonia with hypermetabolism on FDG-PET have been described, but changes of this type can also be adjacent to a neoplasm. Patient prescribed a daily dose of Prednisone 10 mg, with re-imaging planned in 6-8 weeks.   Chest CT on 02/06/2018 revealed the spiculated 2.2 cm right middle lobe lesion was almost completely resolved with only a residual 4 mm irregular nodule identified at this location on a background of architectural distortion/evolving scar. There was a 5 mm right parahilar nodule stable since 11/16/2017(not present on 07/07/2016). There were tiny nodules in both upper lobes suggesting inhalation etiology.  She began prednisone10 mg on 12/07/2017 (discontinued 03/2018).She received Rituxanweekly x 4 (last dose 06/16/2018).  She has diarrhea. 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 She received azithromycin x 3 days. She received ciprofloxacin x 10 days without improvement. She has seen ID in [place]. She began Bactrim on 03/04/2018 (discontinued on 03/11/2018) secondary to increase in creatine.  She has renal  insufficiency. Creatininehas been followed: 1.17 on 11/05/2017, 1.85 on 11/09/2017, 1.38 on 11/12/2017, 1.74 on 11/15/2017, 1.66 on 11/17/2017, and 1.48 on 11/23/2017. Urinalysis on 11/15/2017 revealed revealed no hemoglobin, bilirubin or active sediment.  Liver function tests increased on 02/15/2019.  Work-up on 02/16/2019 revealed hepatitis B E antibody positive.  Negative studies included: hepatitis A total antibody, hepatitis A IgM, hepatitis B surface antigen, hepatitis B E antigen.  Hepatitis B DNA was not detected.  Folate was 80.5 and vitamin B12 was 443.  Symptomatically, she notes lower abdominal discomfort.  Abdominal exam is benign.  She has no CVA tenderness.  LFTS have normalized.  Plan: 1.   Labs today:  CBC with diff, CMP. 2.   Hemolytic anemia Hematocrit 35.2.  Hemoglobin 11.7.  LDH was 245 (normal). No evidence of hemolysis.              Continue to monitor. 3.   Hepatitis B Patient was previously exposed to hepatitis B.       She is on entecavir.       Continue anti-hepatitis B virus prophylaxis for 1 year s/p last dose of Rituxan. 4.   Elevated liver function tests       AST 19.  ALT 27.  Bilirubin 0.3.  Alkaline phosphatase 100  today.   AST 171.  ALT 173.  Bilirubin 1.0.  Alkaline phosphatase 140 on 02/15/2019.       Etiology of spike in liver enzymes unclear.  Patient did report abnormal GI symptoms 3 days prior to initial labs.         Work-up as above.   5.   B12 deficiency B12 was 443 and folate 80.5 on 02/15/2019.             Change folate to every other day.    She is currently taking B12 4 times a week.    Continue to monitor periodically. 6. Right middle lobe nodule             Nodule appeared to be resolving on chest CT from 02/06/2018. Anticipate follow-up imaging to document resolution of nodule. 7.   RTC in 2 months for labs (CBC with diff, CMP, LDH, folate, B12). 8.   RTC in 4 months for MD assessment  and labs (CBC with diff, CMP, LDH).  I discussed the  assessment and treatment plan with the patient.  The patient was provided an opportunity to ask questions and all were answered.  The patient agreed with the plan and demonstrated an understanding of the instructions.  The patient was advised to call back if the symptoms worsen or if the condition fails to improve as anticipated.  I provided 15 minutes of face-to-face time during this this encounter and > 50% was spent counseling as documented under my assessment and plan.    Lequita Asal, MD, PhD    02/22/2019, 11:19 AM   I, Selena Batten, am acting as scribe for [employer]. Mike Gip, MD, PhD.  I, Melissa C. Mike Gip, MD, have reviewed the above documentation for accuracy and completeness, and I agree with the above.

## 2019-02-22 ENCOUNTER — Inpatient Hospital Stay (HOSPITAL_BASED_OUTPATIENT_CLINIC_OR_DEPARTMENT_OTHER): Payer: BC Managed Care – PPO | Admitting: Hematology and Oncology

## 2019-02-22 ENCOUNTER — Inpatient Hospital Stay: Payer: BC Managed Care – PPO

## 2019-02-22 ENCOUNTER — Other Ambulatory Visit: Payer: Self-pay

## 2019-02-22 ENCOUNTER — Encounter: Payer: Self-pay | Admitting: Hematology and Oncology

## 2019-02-22 VITALS — BP 151/88 | HR 77 | Temp 98.0°F | Resp 16 | Wt 176.8 lb

## 2019-02-22 DIAGNOSIS — Z7902 Long term (current) use of antithrombotics/antiplatelets: Secondary | ICD-10-CM

## 2019-02-22 DIAGNOSIS — B181 Chronic viral hepatitis B without delta-agent: Secondary | ICD-10-CM

## 2019-02-22 DIAGNOSIS — Z79899 Other long term (current) drug therapy: Secondary | ICD-10-CM

## 2019-02-22 DIAGNOSIS — M329 Systemic lupus erythematosus, unspecified: Secondary | ICD-10-CM

## 2019-02-22 DIAGNOSIS — D696 Thrombocytopenia, unspecified: Secondary | ICD-10-CM

## 2019-02-22 DIAGNOSIS — E538 Deficiency of other specified B group vitamins: Secondary | ICD-10-CM

## 2019-02-22 DIAGNOSIS — Z881 Allergy status to other antibiotic agents status: Secondary | ICD-10-CM

## 2019-02-22 DIAGNOSIS — J449 Chronic obstructive pulmonary disease, unspecified: Secondary | ICD-10-CM

## 2019-02-22 DIAGNOSIS — Z833 Family history of diabetes mellitus: Secondary | ICD-10-CM

## 2019-02-22 DIAGNOSIS — E785 Hyperlipidemia, unspecified: Secondary | ICD-10-CM

## 2019-02-22 DIAGNOSIS — Z8249 Family history of ischemic heart disease and other diseases of the circulatory system: Secondary | ICD-10-CM

## 2019-02-22 DIAGNOSIS — I700 Atherosclerosis of aorta: Secondary | ICD-10-CM

## 2019-02-22 DIAGNOSIS — D591 Autoimmune hemolytic anemia, unspecified: Secondary | ICD-10-CM

## 2019-02-22 DIAGNOSIS — R918 Other nonspecific abnormal finding of lung field: Secondary | ICD-10-CM

## 2019-02-22 DIAGNOSIS — Z885 Allergy status to narcotic agent status: Secondary | ICD-10-CM

## 2019-02-22 DIAGNOSIS — R7989 Other specified abnormal findings of blood chemistry: Secondary | ICD-10-CM

## 2019-02-22 DIAGNOSIS — Z87891 Personal history of nicotine dependence: Secondary | ICD-10-CM

## 2019-02-22 DIAGNOSIS — D890 Polyclonal hypergammaglobulinemia: Secondary | ICD-10-CM

## 2019-02-22 DIAGNOSIS — R1032 Left lower quadrant pain: Secondary | ICD-10-CM

## 2019-02-22 DIAGNOSIS — R768 Other specified abnormal immunological findings in serum: Secondary | ICD-10-CM

## 2019-02-22 DIAGNOSIS — R5382 Chronic fatigue, unspecified: Secondary | ICD-10-CM

## 2019-02-22 DIAGNOSIS — N289 Disorder of kidney and ureter, unspecified: Secondary | ICD-10-CM

## 2019-02-22 DIAGNOSIS — K589 Irritable bowel syndrome without diarrhea: Secondary | ICD-10-CM

## 2019-02-22 DIAGNOSIS — I10 Essential (primary) hypertension: Secondary | ICD-10-CM

## 2019-02-22 DIAGNOSIS — K30 Functional dyspepsia: Secondary | ICD-10-CM

## 2019-02-22 LAB — CBC WITH DIFFERENTIAL/PLATELET
Abs Immature Granulocytes: 0.01 10*3/uL (ref 0.00–0.07)
Basophils Absolute: 0.1 10*3/uL (ref 0.0–0.1)
Basophils Relative: 2 %
Eosinophils Absolute: 0.1 10*3/uL (ref 0.0–0.5)
Eosinophils Relative: 3 %
HCT: 35.2 % — ABNORMAL LOW (ref 36.0–46.0)
Hemoglobin: 11.7 g/dL — ABNORMAL LOW (ref 12.0–15.0)
Immature Granulocytes: 0 %
Lymphocytes Relative: 22 %
Lymphs Abs: 0.9 10*3/uL (ref 0.7–4.0)
MCH: 31.7 pg (ref 26.0–34.0)
MCHC: 33.2 g/dL (ref 30.0–36.0)
MCV: 95.4 fL (ref 80.0–100.0)
Monocytes Absolute: 0.4 10*3/uL (ref 0.1–1.0)
Monocytes Relative: 10 %
Neutro Abs: 2.4 10*3/uL (ref 1.7–7.7)
Neutrophils Relative %: 63 %
Platelets: 123 10*3/uL — ABNORMAL LOW (ref 150–400)
RBC: 3.69 MIL/uL — ABNORMAL LOW (ref 3.87–5.11)
RDW: 13.2 % (ref 11.5–15.5)
WBC: 3.9 10*3/uL — ABNORMAL LOW (ref 4.0–10.5)
nRBC: 0 % (ref 0.0–0.2)

## 2019-02-22 LAB — COMPREHENSIVE METABOLIC PANEL
ALT: 27 U/L (ref 0–44)
AST: 19 U/L (ref 15–41)
Albumin: 4.2 g/dL (ref 3.5–5.0)
Alkaline Phosphatase: 100 U/L (ref 38–126)
Anion gap: 8 (ref 5–15)
BUN: 27 mg/dL — ABNORMAL HIGH (ref 8–23)
CO2: 27 mmol/L (ref 22–32)
Calcium: 9.2 mg/dL (ref 8.9–10.3)
Chloride: 104 mmol/L (ref 98–111)
Creatinine, Ser: 1.11 mg/dL — ABNORMAL HIGH (ref 0.44–1.00)
GFR calc Af Amer: >60 mL/min (ref >60)
GFR calc non Af Amer: 54 mL/min — ABNORMAL LOW (ref >60)
Glucose, Bld: 103 mg/dL — ABNORMAL HIGH (ref 70–99)
Potassium: 4.4 mmol/L (ref 3.5–5.1)
Sodium: 139 mmol/L (ref 135–145)
Total Bilirubin: 0.3 mg/dL (ref 0.3–1.2)
Total Protein: 7.3 g/dL (ref 6.5–8.1)

## 2019-02-22 NOTE — Progress Notes (Signed)
Pt here for follow up. Denies any concerns.  

## 2019-03-21 ENCOUNTER — Encounter
Admit: 2019-03-21 | Discharge: 2019-03-22 | Payer: PRIVATE HEALTH INSURANCE | Attending: Internal Medicine | Primary: Internal Medicine

## 2019-03-21 DIAGNOSIS — Z79899 Other long term (current) drug therapy: Principal | ICD-10-CM

## 2019-03-21 DIAGNOSIS — D591 Other autoimmune hemolytic anemias: Secondary | ICD-10-CM

## 2019-03-21 DIAGNOSIS — R768 Other specified abnormal immunological findings in serum: Secondary | ICD-10-CM

## 2019-03-21 DIAGNOSIS — Z8619 Personal history of other infectious and parasitic diseases: Secondary | ICD-10-CM

## 2019-03-21 MED ORDER — ENTECAVIR 0.5 MG TABLET
ORAL_TABLET | Freq: Every day | ORAL | 3 refills | 30 days | Status: CP
Start: 2019-03-21 — End: 2019-07-19

## 2019-03-30 ENCOUNTER — Telehealth: Payer: Self-pay | Admitting: Gastroenterology

## 2019-03-30 NOTE — Progress Notes (Signed)
[place] Schedule office visit for abnormal lft Dr Jonathon Bellows MD,MRCP [facility]) Gastroenterology/Hepatology Pager: [phone]

## 2019-03-30 NOTE — Telephone Encounter (Signed)
Left vm for pt to call office and schedule apt so see  Dr. Vicente Males for Abnormal LEFs per [place] note

## 2019-04-13 ENCOUNTER — Non-Acute Institutional Stay: Admit: 2019-04-13 | Discharge: 2019-04-13 | Payer: PRIVATE HEALTH INSURANCE

## 2019-04-13 ENCOUNTER — Encounter
Admit: 2019-04-13 | Discharge: 2019-04-13 | Payer: PRIVATE HEALTH INSURANCE | Attending: Hematology & Oncology | Primary: Hematology & Oncology

## 2019-04-13 DIAGNOSIS — D591 Other autoimmune hemolytic anemias: Principal | ICD-10-CM

## 2019-04-17 ENCOUNTER — Encounter: Payer: Self-pay | Admitting: Podiatry

## 2019-04-17 ENCOUNTER — Other Ambulatory Visit: Payer: Self-pay

## 2019-04-17 ENCOUNTER — Ambulatory Visit: Payer: BLUE CROSS/BLUE SHIELD | Admitting: Podiatry

## 2019-04-17 DIAGNOSIS — L03031 Cellulitis of right toe: Secondary | ICD-10-CM | POA: Insufficient documentation

## 2019-04-17 DIAGNOSIS — L600 Ingrowing nail: Secondary | ICD-10-CM | POA: Diagnosis not present

## 2019-04-17 NOTE — Patient Instructions (Signed)

## 2019-04-17 NOTE — Progress Notes (Signed)
This patient presents to the office with chief complaint of severely painful big right big toenail outside border.  She says the pain and discomfort has been present for 2 weeks.  She says that she now is unable to touch the outside of her right big toe.  She denies any drainage or pus from the site.  She presents the office today for an evaluation and treatment.  Patient is presently taking Plavix.  General Appearance  Alert, conversant and in no acute stress.  Vascular  Dorsalis pedis and posterior tibial  pulses are palpable  bilaterally.  Capillary return is within normal limits  bilaterally. Temperature is within normal limits  bilaterally.  Neurologic  Senn-Weinstein monofilament wire test within normal limits  bilaterally. Muscle power within normal limits bilaterally.  Nails pincer hallux toenails bilateral with marked incurvation noted along the lateral border of the right great toe.  Redness and swelling noted at the distal aspect of the nail border right great toe.  Orthopedic  No limitations of motion  feet .  No crepitus or effusions noted.  No bony pathology or digital deformities noted.  Skin  normotropic skin with no porokeratosis noted bilaterally.  No signs of infections or ulcers noted.    Ingrown Toenail right hallux.  Paronychia right great toe.  IE.  Nail surgery.  Treatment options and alternatives discussed.  Recommended permanent phenol matrixectomy and patient agreed. Right hallux  was prepped with alcohol and a toe block of 3cc of 2% lidocaine plain was administered in a digital toe block. .  The toe was then prepped with betadine solution . A tourniquet was applied to toe. The offending nail border was then excised and matrix tissue exposed.  Phenol was then applied to the matrix tissue followed by an alcohol wash. Surgicell  was applied to surgical site  .Antibiotic ointment and a dry sterile dressing was applied.  The patient was dispensed instructions for aftercare. RTC  1 week.  Surgicell was used due to her taking plavix.     Gardiner Barefoot DPM

## 2019-04-18 ENCOUNTER — Ambulatory Visit: Payer: BC Managed Care – PPO | Admitting: Gastroenterology

## 2019-04-18 VITALS — BP 116/74 | HR 97 | Temp 97.5°F | Ht 63.0 in | Wt 178.2 lb

## 2019-04-18 DIAGNOSIS — R945 Abnormal results of liver function studies: Secondary | ICD-10-CM

## 2019-04-18 DIAGNOSIS — R7989 Other specified abnormal findings of blood chemistry: Secondary | ICD-10-CM

## 2019-04-18 NOTE — Progress Notes (Signed)
Jonathon Bellows MD, MRCP(U.K) [address]  [place]  [place], [place] [address]  Main: [phone]  Fax: [phone]   Primary Care Physician: Frazier Richards, MD  Primary Gastroenterologist:  Dr. Jonathon Bellows   Chief Complaint  Patient presents with  . Elevated Hepatic Enzymes    HPI: Tina Page is a 63 y.o. female  Summary of history : She is here today to follow up to her last  visit for elevated LFT's on in May 2019 . LFt's were acutely elevated for 3 weeks and rapidly returned to normal.11/03/17 - HCV not detected. Hep B c ab +ve, AMA negative. Elevated Ferritin . Hbsag-negative.CT abdomen 10/2017 shows no liver abnormalities. 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 Elevated ferritin 667  Rest iron studies ,ceruloplasmin,celiac serology, LKM ab   are normal.  Hep A/B/C serology negative.   Interval history  01/11/2018-04/18/2019  She was treated for her autoimmune hemolytic anemia previously on chronic steroids and transition to Rituxan.  In view of hepatitis B core antibody being positive she was treated with entecavir prophylactically prior to Rituxan.  Plan is to keep her on entecavir for 1 year from the last dose of Rituxan which would mean that we need to stop in October 2020.  Began entecavir in April 2019  I was contacted in June 2020 for elevation in the liver function test.  Dr. Kem Parkinson note mentions that she had some GI symptoms 3 days prior to the labs being checked.  At that time hepatitis B E antigen, hepatitis A total antibody, hepatitis A IgM, hepatitis B surface antigen, were all negative.  Repeat checking of LFTs on 02/22/2019 had normalized.  Denies commencing any other new medication.  Denies any abdominal pain.  Feels well at this point of time.    Current Outpatient Medications  Medication Sig Dispense Refill  . albuterol (PROVENTIL HFA;VENTOLIN HFA) 108 (90 Base) MCG/ACT inhaler Inhale 2 puffs into the  lungs every 6 (six) hours as needed for wheezing or shortness of breath.     . allopurinol (ZYLOPRIM) 100 MG tablet TAKE 1 TABLET BY MOUTH EVERY DAY 90 tablet 1  . clopidogrel (PLAVIX) 75 MG tablet Take 1 tablet (75 mg total) by mouth daily. 30 tablet 5  . cyanocobalamin 500 MCG tablet Take 500 mcg by mouth daily.    [name] Kitchen entecavir (BARACLUDE) 0.5 MG tablet Take by mouth.    . folic acid (FOLVITE) 1 MG tablet TAKE 1 TABLET (1 MG TOTAL) BY MOUTH DAILY. 90 tablet 1  . hydrochlorothiazide (HYDRODIURIL) 25 MG tablet Take 25 mg by mouth daily.    [name] Kitchen levothyroxine (SYNTHROID, LEVOTHROID) 75 MCG tablet Take 75 mcg by mouth daily before breakfast.     . lisinopril (PRINIVIL,ZESTRIL) 20 MG tablet Take 20 mg by mouth daily.     [name] Kitchen loperamide (IMODIUM) 2 MG capsule Take 2 mg by mouth 2 (two) times daily.    [name] Kitchen omeprazole (PRILOSEC) 20 MG capsule Take 1 capsule (20 mg total) by mouth daily. 60 capsule 0  . potassium chloride (KLOR-CON) 20 MEQ packet Take 20 mEq by mouth daily.    . pravastatin (PRAVACHOL) 40 MG tablet Take 40 mg by mouth every evening.     No current facility-administered medications for this visit.     Allergies as of 04/18/2019 - Review Complete 04/18/2019  Allergen Reaction Noted  . Codeine Anaphylaxis 07/07/2016  . Ciprofloxacin Swelling 02/21/2018    ROS:  General: Negative for anorexia, weight loss, fever, chills, fatigue, weakness. ENT: Negative for hoarseness, difficulty swallowing , nasal congestion. CV: Negative for chest pain, angina, palpitations, dyspnea on exertion, peripheral edema.  Respiratory: Negative for dyspnea at rest, dyspnea on exertion, cough, sputum, wheezing.  GI: See history of present illness. GU:  Negative for dysuria, hematuria, urinary incontinence, urinary frequency, nocturnal urination.  Endo: Negative for unusual weight change.    Physical Examination:   BP 116/74   Pulse 97   Temp (!) 97.5 F (36.4 C)   Ht 5\' 3"  (1.6 m)   Wt 178 lb 3.2 oz  (80.8 kg)   BMI 31.57 kg/m   General: Well-nourished, well-developed in no acute distress.  Eyes: No icterus. Conjunctivae pink. Mouth: Oropharyngeal mucosa moist and pink , no lesions erythema or exudate. Lungs: Clear to auscultation bilaterally. Non-labored. Heart: Regular rate and rhythm, no murmurs rubs or gallops.  Abdomen: Bowel sounds are normal, nontender, nondistended, no hepatosplenomegaly or masses, no abdominal bruits or hernia , no rebound or guarding.   Extremities: No lower extremity edema. No clubbing or deformities. Neuro: Alert and oriented x 3.  Grossly intact. Skin: Warm and dry, no jaundice.   Psych: Alert and cooperative, normal mood and affect.   Imaging Studies: No results found.  Assessment and Plan:   Tina Page is a 63 y.o. 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 Presently the LFT's are normal , they were acutely raised for a short period of time and going back historically the LFT's have been normal.  She is managed at [facility] for her autoimmune hemolytic anemia along with Dr. Mike Gip at [facility].  She has been on Rituxan last dose was in October 2019.  Prophylactically commenced on entecavir due to positive hepatitis B core antibody indicating prior infection.  I am seeing her today for short rise in her transaminases which quickly returned back to normal.  I believe this might be secondary to an infection or some form for medication but has acutely resolved.  I do not think this requires any further evaluation.  If she were to have further rise in LFTs, we always need to worry about hepatitis B reactivation although less likely as she is on entecavir.  She did have a positive F-actin antibody and if her LFTs were to remain persistently elevated she would require a liver biopsy to decide on treatment options in the future.  Suggest monitoring her LFTs every 3 to 4 months.      Dr Jonathon Bellows   MD,MRCP [facility]) Follow up in as needed

## 2019-04-19 LAB — HEPATIC FUNCTION PANEL
ALT: 22 IU/L (ref 0–32)
AST: 23 IU/L (ref 0–40)
Albumin: 4.2 g/dL (ref 3.8–4.8)
Alkaline Phosphatase: 102 IU/L (ref 39–117)
Bilirubin Total: 0.4 mg/dL (ref 0.0–1.2)
Bilirubin, Direct: 0.14 mg/dL (ref 0.00–0.40)
Total Protein: 6.8 g/dL (ref 6.0–8.5)

## 2019-04-20 ENCOUNTER — Other Ambulatory Visit: Payer: BC Managed Care – PPO

## 2019-04-20 ENCOUNTER — Telehealth: Payer: Self-pay

## 2019-04-20 NOTE — Telephone Encounter (Signed)
-----   Message from Jonathon Bellows, MD sent at 04/19/2019  1:18 PM EDT ----- Sherald Hess inform LFT's normal x2 . F/u as needed   C/c Dr Mike Gip   Dr Jonathon Bellows MD,MRCP [facility]) Gastroenterology/Hepatology Pager: [phone]

## 2019-04-20 NOTE — Telephone Encounter (Signed)
Patient called & returned Jadijah's call

## 2019-04-20 NOTE — Telephone Encounter (Signed)
Called pt to inform her of lab results.  Unable to contact, LVM to return call

## 2019-04-21 ENCOUNTER — Encounter: Admit: 2019-04-21 | Discharge: 2019-04-22 | Payer: PRIVATE HEALTH INSURANCE

## 2019-04-21 DIAGNOSIS — D591 Other autoimmune hemolytic anemias: Principal | ICD-10-CM

## 2019-04-24 ENCOUNTER — Ambulatory Visit: Payer: BC Managed Care – PPO | Admitting: Podiatry

## 2019-04-24 ENCOUNTER — Encounter: Payer: Self-pay | Admitting: Podiatry

## 2019-04-24 ENCOUNTER — Other Ambulatory Visit: Payer: Self-pay

## 2019-04-24 DIAGNOSIS — Z09 Encounter for follow-up examination after completed treatment for conditions other than malignant neoplasm: Secondary | ICD-10-CM | POA: Insufficient documentation

## 2019-04-24 MED ORDER — CEPHALEXIN 500 MG PO CAPS: 500.0000 mg | ORAL_CAPSULE | Freq: Two times a day (BID) | ORAL | 0 refills | Status: DC

## 2019-04-24 NOTE — Telephone Encounter (Signed)
Spoke with pt and informed her of her lab results.

## 2019-04-24 NOTE — Progress Notes (Signed)
This patient presents the office follow-up for nail surgery to her right big toe.  She says she is having soreness as well as the toe is red following the surgery.  She says is not as painful as it was last week but she still experiencing pain at the site of the surgery.  She presents the office today for continued evaluation and treatment.  Patient is taking Plavix  Vascular  Dorsalis pedis and posterior tibial pulses are palpable  B/L.  Capillary return  WNL.  Temperature gradient is  WNL.  Skin turgor  WNL  Sensorium  Senn Weinstein monofilament wire  WNL. Normal tactile sensation.  Nail Exam  Patient has normal nails with no evidence of bacterial or fungal infection.  There is redness and swelling noted at the base of the lateral border right great toe.  Palpable pain noted at the proximal nail fold.  Orthopedic  Exam  Muscle tone and muscle strength  WNL.  No limitations of motion feet  B/L.  No crepitus or joint effusion noted.  Foot type is unremarkable and digits show no abnormalities.  Bony prominences are unremarkable.  Skin  No open lesions.  Normal skin texture and turgor.  S/P Nail surgery right hallux  ROV.  Discussed this condition with this patient.  Patient is still having pain and discomfort and there appears to be a brownish discoloration at the proximal nail fold.  Therefore I am going to prescribe her cephalexin No. 15 to take 1 twice daily until completed.  She is to return to the office in 10 days.  She is continuing peroxide washes at the site of the surgery.   Gardiner Barefoot DPM

## 2019-05-04 ENCOUNTER — Other Ambulatory Visit: Payer: Self-pay

## 2019-05-04 ENCOUNTER — Encounter: Payer: Self-pay | Admitting: Podiatry

## 2019-05-04 ENCOUNTER — Ambulatory Visit: Payer: BC Managed Care – PPO | Admitting: Podiatry

## 2019-05-04 DIAGNOSIS — Z09 Encounter for follow-up examination after completed treatment for conditions other than malignant neoplasm: Secondary | ICD-10-CM

## 2019-05-04 NOTE — Progress Notes (Signed)
This patient returns to the office following nail surgery two  week ago.  The patient says toe has been soaked and bandaged as directed. She has taken her antibiotics and her infection has resolved.  There has been improvement of the toe since the antibiotic has been prescribed.. The patient presents for continued evaluation and treatment.  GENERAL APPEARANCE: Alert, conversant. Appropriately groomed. No acute distress.  VASCULAR: Pedal pulses palpable at  [facility] and PT bilateral.  Capillary refill time is immediate to all digits,  Normal temperature gradient.    NEUROLOGIC: sensation is normal to 5.07 monofilament at 5/5 sites bilateral.  Light touch is intact bilateral, Muscle strength normal.  MUSCULOSKELETAL: acceptable muscle strength, tone and stability bilateral.  Intrinsic muscluature intact bilateral.  Rectus appearance of foot and digits noted bilateral.   DERMATOLOGIC: skin color, texture, and turgor are within normal limits.  No preulcerative lesions or ulcers  are seen, no interdigital maceration noted.   NAILS  There is necrotic tissue along the nail groove  In the absence of redness swelling and pain.  DX  S/p nail surgery  ROV  Home instructions were discussed.  Patient to call the office if there are any questions or concerns.   Gardiner Barefoot DPM

## 2019-05-15 DIAGNOSIS — N1831 Chronic kidney disease, stage 3a: Secondary | ICD-10-CM | POA: Insufficient documentation

## 2019-05-15 DIAGNOSIS — N183 Chronic kidney disease, stage 3 unspecified: Secondary | ICD-10-CM | POA: Insufficient documentation

## 2019-06-02 ENCOUNTER — Other Ambulatory Visit: Payer: Self-pay | Admitting: Hematology and Oncology

## 2019-06-16 NOTE — Progress Notes (Signed)
No new changes noted today. The patient Name and DOB has bee verified by phone.

## 2019-06-18 NOTE — Progress Notes (Signed)
[facility]  [address], [address] [place], [place] [address] Phone: [phone]  Fax: [phone]   Clinic Day:  06/19/2019  Referring physician: Frazier Richards, MD  Chief Complaint: Tina Page is a 63 y.o. female with a right middle lobe mass, warm autoimmune hemolytic anemia, and renal insufficiency who is seen for her 4 month assessment.  HPI: The patient was last seen in the hematology clinic on 02/22/2019. At that time, she noted lower abdominal discomfort.  Abdominal exam was benign.  She had no CVA tenderness.  LFTS had normalized.   Labs on 04/18/2019 revealed an AST of 23 and ALT of 22.   She met with Dr. Gabriel Rainwater on 03/21/2019 at the Infectious Disease Clinic.   Recommendations were to continue antihepatitis B virus prophylaxis with entecavir 0.5 mg a day.  If creatinine greater than 1.3, her dose would need to be renally adjusted.  Anticipate that she would need to be on entecavir for 1 year from the last dose of Rituxan (06/2019) unless further immunosuppression needed.  She has a follow-up in 07/2019.   She saw Dr. Floreen Comber Key and Dr Lorenza Cambridge 04/13/2019 at [facility].  There was no evidence of hemolysis.  She continued folic acid.  Plan was for meningococcal vaccine.  Abdominal ultrasound on 04/21/2019 revealed increased echogenicity of the liver parenchyma which could be suggestive of fatty liver. There were no obvious focal lesions. There is no obvious splenomegaly.  During the interim, she is "ok." She was placed on doxycycline on 06/17/2019 for a sinus and chest infection which caused a fever and cough. She was negative for COVID-19. She has continued to be fatigued.   She received her influenza vaccine on 04/20/2019.   She is agreeable to undergo repeat chest CT to follow-up on the RML lesion.   Past Medical History:  Diagnosis Date  . Anemia   . Atherosclerosis of native arteries of extremity with intermittent claudication  ([place]) 07/20/2016  . COPD (chronic obstructive pulmonary disease) ([place])   . 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 Elevated liver function tests 11/03/2017  . GERD (gastroesophageal reflux disease)   . Heme positive stool 01/29/2015  . Hepatitis C 07/12/2016  . HLD (hyperlipidemia)   . Hypertension   . Hypothyroidism   . 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 Thrombocytopenia ([place]) 10/04/2016    Past Surgical History:  Procedure Laterality Date  . ELECTROMAGNETIC NAVIGATION BROCHOSCOPY N/A 12/07/2017   Procedure: ELECTROMAGNETIC NAVIGATION BRONCHOSCOPY;  Surgeon: Flora Lipps, MD;  Location: [facility] ORS;  Service: Cardiopulmonary;  Laterality: N/A;  . ESOPHAGOGASTRODUODENOSCOPY (EGD) WITH PROPOFOL N/A 07/08/2016   Procedure: ESOPHAGOGASTRODUODENOSCOPY (EGD) WITH PROPOFOL;  Surgeon: Manya Silvas, MD;  Location: [facility] ENDOSCOPY;  Service: Endoscopy;  Laterality: N/A;  . KNEE SURGERY Right    repair of acl tear  . PERIPHERAL VASCULAR CATHETERIZATION N/A 07/10/2016   Procedure: Lower Extremity Angiography;  Surgeon: Katha Cabal, MD;  Location: [place] CV LAB;  Service: Cardiovascular;  Laterality: N/A;  . PERIPHERAL VASCULAR CATHETERIZATION N/A 07/10/2016   Procedure: Abdominal Aortogram w/Lower Extremity;  Surgeon: Katha Cabal, MD;  Location: [place] [facility];  Service: Cardiovascular;  Laterality: N/A;  . PERIPHERAL VASCULAR CATHETERIZATION  07/10/2016   Procedure: Lower Extremity Intervention;  Surgeon: Katha Cabal, MD;  Location: [place] [facility];  Service: Cardiovascular;;    Family History  Problem Relation Age of Onset  . Diabetes  Mother   . Hypertension Mother   . Diabetes Maternal Grandfather   . Hypertension Maternal Grandfather     Social History:  reports that she quit smoking about 2 years ago. Her smoking use included cigarettes. She smoked 0.50 packs per day. She has never used smokeless tobacco. She reports  that she does not drink alcohol or use drugs. She has a 21 pack year smoking history (1/2 pack/day from age [age]). The patient works at [employer] She is exposed to cold temperatures. She works 3rd shift. She has a daughter, [name] and a daughter named [name]. She lives in [place]. The patient is alone today.   Allergies:  Allergies  Allergen Reactions  . Codeine Anaphylaxis  . Ciprofloxacin Swelling    Facial swelling following single oral dose.     Current Medications: Current Outpatient Medications  Medication Sig Dispense Refill  . allopurinol (ZYLOPRIM) 100 MG tablet TAKE 1 TABLET BY MOUTH EVERY DAY 30 tablet 5  . clopidogrel (PLAVIX) 75 MG tablet Take 1 tablet (75 mg total) by mouth daily. 30 tablet 5  . cyanocobalamin 500 MCG tablet Take 500 mcg by mouth daily.    [name] Kitchen entecavir (BARACLUDE) 0.5 MG tablet Take by mouth.    . folic acid (FOLVITE) 1 MG tablet TAKE 1 TABLET (1 MG TOTAL) BY MOUTH DAILY. 90 tablet 1  . hydrochlorothiazide (HYDRODIURIL) 25 MG tablet Take 25 mg by mouth daily.    [name] Kitchen levothyroxine (SYNTHROID, LEVOTHROID) 75 MCG tablet Take 75 mcg by mouth daily before breakfast.     . lisinopril (PRINIVIL,ZESTRIL) 20 MG tablet Take 20 mg by mouth daily.     [name] Kitchen omeprazole (PRILOSEC) 20 MG capsule Take 1 capsule (20 mg total) by mouth daily. 60 capsule 0  . potassium chloride (KLOR-CON) 20 MEQ packet Take 20 mEq by mouth daily.    . pravastatin (PRAVACHOL) 40 MG tablet Take 40 mg by mouth every evening.    [name] Kitchen albuterol (PROVENTIL HFA;VENTOLIN HFA) 108 (90 Base) MCG/ACT inhaler Inhale 2 puffs into the lungs every 6 (six) hours as needed for wheezing or shortness of breath.     . cephALEXin (KEFLEX) 500 MG capsule Take 1 capsule (500 mg total) by mouth 2 (two) times daily. (Patient not taking: Reported on 06/16/2019) 15 capsule 0  . doxycycline (VIBRA-TABS) 100 MG tablet Take 100 mg by mouth 2 (two) times daily. for 10 days    . loperamide (IMODIUM) 2 MG  capsule Take 2 mg by mouth 2 (two) times daily.     No current facility-administered medications for this visit.     Review of Systems  Constitutional: Positive for malaise/fatigue (chronic). Negative for chills, fever and weight loss (up 4 lbs since last visit).       Feels "ok".  HENT: Positive for congestion (improved). Negative for ear pain, hearing loss and sore throat.   Eyes: Negative.  Negative for blurred vision and double vision.  Respiratory: Positive for cough. Negative for hemoptysis, sputum production, shortness of breath and stridor.   Cardiovascular: Negative.  Negative for chest pain, palpitations and leg swelling.  Gastrointestinal: Positive for abdominal pain (LUQ, comes and goes) and diarrhea (depends on what she eats). Negative for constipation, heartburn, nausea and vomiting.  Genitourinary: Negative.  Negative for dysuria, frequency and urgency.  Musculoskeletal: Negative.  Negative for back pain, joint pain and myalgias.  Skin: Negative.  Negative for rash.  Neurological: Negative.  Negative for dizziness, tingling, sensory change, focal weakness, weakness and headaches.  Endo/Heme/Allergies: Negative.  Does not bruise/bleed easily.  Psychiatric/Behavioral: Negative.  Negative for depression and memory loss. The patient is not nervous/anxious and does not have insomnia.   All other systems reviewed and are negative.  Performance status ECOG FS:1 - Symptomatic but completely ambulatory  Blood pressure 137/64, pulse 75, temperature 97.9 F (36.6 C), temperature source Oral, resp. rate 16, weight 180 lb 0.1 oz (81.7 kg), SpO2 100 %.  Physical Exam  Constitutional: She is oriented to person, place, and time. She appears well-developed and well-nourished. No distress. Face mask in place.  HENT:  Head: Normocephalic and atraumatic.  Mouth/Throat: Oropharynx is clear and moist. No oropharyngeal exudate.  Gray hair.  Eyes: EOM are normal. No scleral icterus.  Glasses.   Blue eyes.  Neck: Normal range of motion. Neck supple. No JVD present.  Cardiovascular: Normal rate, regular rhythm and normal heart sounds.  No murmur heard. Pulmonary/Chest: Effort normal and breath sounds normal. No respiratory distress. She has no wheezes. She has no rales.  Abdominal: Soft. Bowel sounds are normal. She exhibits no distension and no mass. There is no abdominal tenderness. There is no rebound and no guarding.  Musculoskeletal: Normal range of motion.        General: No edema.  Lymphadenopathy:       Head (right side): No preauricular, no posterior auricular and no occipital adenopathy present.       Head (left side): No preauricular, no posterior auricular and no occipital adenopathy present.    She has no cervical adenopathy.    She has no axillary adenopathy.       Right: No inguinal and no supraclavicular adenopathy present.       Left: No inguinal and no supraclavicular adenopathy present.  Neurological: She is alert and oriented to person, place, and time.  Skin: Skin is warm and dry. No rash noted. She is not diaphoretic. No erythema. No pallor.  Psychiatric: She has a normal mood and affect. Her behavior is normal. Judgment and thought content normal.  Nursing note and vitals reviewed.   Appointment on 06/19/2019  Component Date Value Ref Range Status  . LDH 06/19/2019 156  98 - 192 U/L Final   Performed at [facility], [address] [place], [place] [address]  . Sodium 06/19/2019 139  135 - 145 mmol/L Final  . Potassium 06/19/2019 4.2  3.5 - 5.1 mmol/L Final  . Chloride 06/19/2019 106  98 - 111 mmol/L Final  . CO2 06/19/2019 25  22 - 32 mmol/L Final  . Glucose, Bld 06/19/2019 106* 70 - 99 mg/dL Final  . BUN 06/19/2019 30* 8 - 23 mg/dL Final  . Creatinine, Ser 06/19/2019 1.15* 0.44 - 1.00 mg/dL Final  . Calcium 06/19/2019 9.4  8.9 - 10.3 mg/dL Final  . Total Protein 06/19/2019 7.7  6.5 - 8.1 g/dL Final  . Albumin 06/19/2019 4.2  3.5 - 5.0  g/dL Final  . AST 06/19/2019 23  15 - 41 U/L Final  . ALT 06/19/2019 22  0 - 44 U/L Final  . Alkaline Phosphatase 06/19/2019 86  38 - 126 U/L Final  . Total Bilirubin 06/19/2019 0.7  0.3 - 1.2 mg/dL Final  . GFR calc non Af Amer 06/19/2019 51* >60 mL/min Final  . GFR calc Af Amer 06/19/2019 59* >60 mL/min Final  . Anion gap 06/19/2019 8  5 - 15 Final   Performed at [facility], [address], [place], [place] [address]  .  WBC 06/19/2019 4.3  4.0 - 10.5 K/uL Final  . RBC 06/19/2019 4.18  3.87 - 5.11 MIL/uL Final  . Hemoglobin 06/19/2019 12.9  12.0 - 15.0 g/dL Final  . HCT 06/19/2019 39.3  36.0 - 46.0 % Final  . MCV 06/19/2019 94.0  80.0 - 100.0 fL Final  . MCH 06/19/2019 30.9  26.0 - 34.0 pg Final  . MCHC 06/19/2019 32.8  30.0 - 36.0 g/dL Final  . RDW 06/19/2019 13.2  11.5 - 15.5 % Final  . Platelets 06/19/2019 120* 150 - 400 K/uL Final  . nRBC 06/19/2019 0.0  0.0 - 0.2 % Final  . Neutrophils Relative % 06/19/2019 70  % Final  . Neutro Abs 06/19/2019 3.0  1.7 - 7.7 K/uL Final  . Lymphocytes Relative 06/19/2019 16  % Final  . Lymphs Abs 06/19/2019 0.7  0.7 - 4.0 K/uL Final  . Monocytes Relative 06/19/2019 8  % Final  . Monocytes Absolute 06/19/2019 0.3  0.1 - 1.0 K/uL Final  . Eosinophils Relative 06/19/2019 3  % Final  . Eosinophils Absolute 06/19/2019 0.1  0.0 - 0.5 K/uL Final  . Basophils Relative 06/19/2019 2  % Final  . Basophils Absolute 06/19/2019 0.1  0.0 - 0.1 K/uL Final  . Immature Granulocytes 06/19/2019 1  % Final  . Abs Immature Granulocytes 06/19/2019 0.02  0.00 - 0.07 K/uL Final   Performed at [facility], [address] [place], [place] [address]    Assessment:  Tina Page is a 63 y.o. 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 One week prior to presentation, she felt like she was coming down with something. She denied any fever, runny nose, sore throat or  cough. She had some diarrhea. She denied any new medications or herbal products.  Anemia workuprevealed a cold autoantibody (IgG and complement). Reticulocyte count was 11.3%. Ferritin was 562. Iron studies revealed a saturation of 20% and a TIBC of 214 (low). B12 was 231 (low normal). Folate was 42. Peripheral smear revealed rouleaux formation. Labs on 06/21/2019revealed normal flow cytometry, SPEP, and immunoglobulins.  Additional testingincluded the following+ studies: hepatitis C antibody,hepatitis B core antibody,CMV IgM, and EBV VCA (IgM and IgG). Hepatitis B by PCRwas negative. Hepatitis C RNAwas negative. Mycoplasma pneumonia IgM was negative. Reticulocyte count was 11.3% (high) indicating appropriate marrow response. C3 and C4 were normal. Cold agglutinin titer was negative x 2. Negative studies included: ANA, hepatitis B surface antigen, SPEP, and free light chain ratio. 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 MMA was initially 330 (normal). Repeat MMA was elevated on 08/01/2016 confirming B12 deficiency. Hepatitis B surface antibody was positive and hepatitis B surface antigen was negative on 08/06/2016.  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 There was no adenopathy. Spleen was normal. Abdominal ultrasoundon 04/15/2017 revealed a normal spleen and sludge in the gallbladder. The liver was echogenic consistent fatty infiltration and/or hepatocellular disease.  She underwent PTCA and stent placementin right and left common iliac arteries and left external iliac artery on 07/10/2016.She is on Plavix.EGD on 03/19/2015 revealed gastritis in the body and antrum. Colonoscopyon 03/19/2015 revealed one hyperplastic polyp. EGDon  07/08/2016 was normal. No evidence of bleeding.  She has received6 units of warmed PRBCsto date (last on 08/01/2016).If Rituxan is needed, she requires entecavir  0.5 mg q day beginning 2 weeks prior to Rituxan. In addition, hepatitis B viral level and LFTs should be checked monthly.  She began steroids(1 mg/kg) on 08/01/2016. She stopped prednisoneon 03/12/2017. She is onfolic acid1 mg a day.   She hasB12 deficiency. B12 was 231 on 07/09/2016. She is on oral B12. B12 and folate were normal on 03/09/2017 and 10/12/2017.  She developed peri-oral and intranasalherpes simplex-1. She was treated with valacyclovir and doxycycline on 10/19/2016. She developed a transient elevated alkaline phosphataseon 10/19/2016 which subsequently normalized.  She was documented to have awarm autoantibodyon 10/12/2017. LDH and bilirubin were normal. Hematocrit had improved from prior testing.   She developedflu like symptomson 10/26/2017. Symptoms included cough, myalgias, and fever (tmax unknown). She was prescribed Mucinex, Tamiflu, and amoxicillin. She only took amoxicillin x 5 days. She developedincreased liver function testson 11/02/2017. CMV IgG was positive. EBV VCA IgG, NA IgG, early antigen antibody IgG were elevated. EBV VCA IgM was <36. Testing was c/w a convalescence/past infection or reactivated infection. LFTs normalized on 11/15/2017. Smooth muscle antibodywas 39 (high) on 11/10/2017 and 34 (high) on 11/30/2017.   Abdomen and pelvic CTon 11/10/2017 revealed a 2.2 cm spiculated density in right middle lobe concerning for malignancy. Chest CTon 11/16/2017 revealed a 2.2 x 2.0 x 2.4 spiculated RIGHT middle lobe mass. There was tiny nonspecific upper lobe pulmonary nodules greater on RIGHT, largest 3 mm, of uncertain etiology. There was additional 8 x 8 mm opacity in the posterior sulcus of the RIGHT lower lobe.  PET scanon 11/22/2017 revealed a 2 cm  hypermetabolic right middle lobe lung mass (SUV 3.4) consistent with primary lung neoplasm. There were no findings for mediastinal/hilar lymphadenopathy or metastatic disease. There were areas of hypermetabolism involving the left oblique abdominal muscles and the anorectal junction.  PFTs on 11/18/2017 revealed an FEV1 of 1.22 liters (51%). DLCO adj was 6.8 mL/mmHg/min (32%).  ENB done on 12/07/2017. Cytology was negative for malignancy. Pathologydemonstrated organizing pneumonia and chronic bronchitis. Pathologist commented that organizing pneumonia spanned about 2 mm in one fragment. Cases of focal organizing pneumonia with hypermetabolism on FDG-PET have been described, but changes of this type can also be adjacent to a neoplasm. Patient prescribed a daily dose of Prednisone 10 mg, with re-imaging planned in 6-8 weeks.   Chest CT on 02/06/2018 revealed the spiculated 2.2 cm right middle lobe lesion was almost completely resolved with only a residual 4 mm irregular nodule identified at this location on a background of architectural distortion/evolving scar. There was a 5 mm right parahilar nodule stable since 11/16/2017(not present on 07/07/2016). There were tiny nodules in both upper lobes suggesting inhalation etiology.  She began prednisone10 mg on 12/07/2017 (discontinued 03/2018).She received Rituxanweekly x 4 (last dose 06/16/2018).  She has diarrhea. 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 She received azithromycin x 3 days. She received ciprofloxacin x 10 days without improvement. She has seen ID in [place]. She began Bactrim on 03/04/2018 (discontinued on 03/11/2018) secondary to increase in creatine.  She has renal insufficiency. Creatininehas been followed: 1.17 on 11/05/2017, 1.85 on 11/09/2017, 1.38 on 11/12/2017, 1.74 on 11/15/2017, 1.66 on 11/17/2017, and 1.48 on 11/23/2017. Urinalysis on 11/15/2017 revealed revealed no  hemoglobin, bilirubin or active sediment.  Liver function tests increased on 02/15/2019.  Work-up on 02/16/2019 revealed hepatitis B E antibody positive.  Negative studies included: hepatitis A total antibody, hepatitis A IgM, hepatitis B surface antigen, hepatitis B E antigen.  Hepatitis B DNA was not detected.  Folate was 80.5 and vitamin B12 was 443.  Symptomatically, she is recovering from a sinus and chest infection.  She is on doxycycline.  Exam is unremarkable.  Plan: 1.   Labs today:  CBC with diff, CMP, LDH, B12, folate. 2.   Hemolytic anemia Clinically, she is doing well.  Hematocrit 39.3.  Hemoglobin 12.9.  LDH is 156 (normal). No evidence of hemolysis.  Anticipate follow-up every 6 months after next visit if stable. 3.   Hepatitis B Patient was previously exposed to hepatitis B.       She is currently on entecavir.  Plan is to continue anti-hepatitis B virus prophylaxis for 1 year s/p last dose of Rituxan.  Patient has follow-up appointment with ID in 07/2019 to likely discontinue entecavir.       Per notes, patient also scheduled to receive meningococcal vaccine. 4.   Elevated liver function tests       AST 23.  ALT 22.  Bilirubin 0.7.  Alkaline phosphatase 86  today.   AST 171.  ALT 173.  Bilirubin 1.0.  Alkaline phosphatase 140 on 02/15/2019.       Etiology of spike in liver enzymes unclear.  Continue to monitor.   5.   B12 deficiency B12 was 443 and folate 80.5 on 02/15/2019.             Folate was changed to every other day.    B12 was changed to 4 times a week.    Follow-up B12 and folate levels today. 6. Right middle lobe nodule             Review chest CT from 02/06/2018.  RML nodule appeared to be decreasing in size (4 mm).  Patient would like follow-up imaging to confirm resolution. 7.   Schedule chest CT on 06/27/2019. 8.   RTC in 4 months for MD assessment, labs (CBC with diff, CMP, LDH), and review of chest CT.  I discussed  the assessment and treatment plan with the patient.  The patient was provided an opportunity to ask questions and all were answered.  The patient agreed with the plan and demonstrated an understanding of the instructions.  The patient was advised to call back if the symptoms worsen or if the condition fails to improve as anticipated.   Lequita Asal, MD, PhD    06/19/2019, 8:55 AM   I, Jacqualyn Posey, am acting as a Education administrator for [employer]. Mike Gip, MD.   I, Melissa C. Mike Gip, MD, have reviewed the above documentation for accuracy and completeness, and I agree with the above.

## 2019-06-19 ENCOUNTER — Encounter: Payer: Self-pay | Admitting: Hematology and Oncology

## 2019-06-19 ENCOUNTER — Inpatient Hospital Stay: Payer: BC Managed Care – PPO | Attending: Hematology and Oncology | Admitting: Hematology and Oncology

## 2019-06-19 ENCOUNTER — Inpatient Hospital Stay: Payer: BC Managed Care – PPO

## 2019-06-19 ENCOUNTER — Other Ambulatory Visit: Payer: Self-pay

## 2019-06-19 VITALS — BP 137/64 | HR 75 | Temp 97.9°F | Resp 16 | Wt 180.0 lb

## 2019-06-19 DIAGNOSIS — Z87891 Personal history of nicotine dependence: Secondary | ICD-10-CM | POA: Diagnosis not present

## 2019-06-19 DIAGNOSIS — E039 Hypothyroidism, unspecified: Secondary | ICD-10-CM | POA: Diagnosis not present

## 2019-06-19 DIAGNOSIS — B1910 Unspecified viral hepatitis B without hepatic coma: Secondary | ICD-10-CM | POA: Diagnosis not present

## 2019-06-19 DIAGNOSIS — D591 Autoimmune hemolytic anemia, unspecified: Secondary | ICD-10-CM | POA: Insufficient documentation

## 2019-06-19 DIAGNOSIS — E785 Hyperlipidemia, unspecified: Secondary | ICD-10-CM | POA: Insufficient documentation

## 2019-06-19 DIAGNOSIS — R7989 Other specified abnormal findings of blood chemistry: Secondary | ICD-10-CM | POA: Insufficient documentation

## 2019-06-19 DIAGNOSIS — R911 Solitary pulmonary nodule: Secondary | ICD-10-CM

## 2019-06-19 DIAGNOSIS — R768 Other specified abnormal immunological findings in serum: Secondary | ICD-10-CM

## 2019-06-19 DIAGNOSIS — Z7902 Long term (current) use of antithrombotics/antiplatelets: Secondary | ICD-10-CM | POA: Diagnosis not present

## 2019-06-19 DIAGNOSIS — Z79899 Other long term (current) drug therapy: Secondary | ICD-10-CM | POA: Insufficient documentation

## 2019-06-19 DIAGNOSIS — R05 Cough: Secondary | ICD-10-CM | POA: Insufficient documentation

## 2019-06-19 DIAGNOSIS — K219 Gastro-esophageal reflux disease without esophagitis: Secondary | ICD-10-CM | POA: Insufficient documentation

## 2019-06-19 DIAGNOSIS — E538 Deficiency of other specified B group vitamins: Secondary | ICD-10-CM | POA: Insufficient documentation

## 2019-06-19 DIAGNOSIS — Z8249 Family history of ischemic heart disease and other diseases of the circulatory system: Secondary | ICD-10-CM | POA: Diagnosis not present

## 2019-06-19 DIAGNOSIS — J449 Chronic obstructive pulmonary disease, unspecified: Secondary | ICD-10-CM | POA: Insufficient documentation

## 2019-06-19 DIAGNOSIS — R5383 Other fatigue: Secondary | ICD-10-CM | POA: Insufficient documentation

## 2019-06-19 DIAGNOSIS — I10 Essential (primary) hypertension: Secondary | ICD-10-CM | POA: Insufficient documentation

## 2019-06-19 LAB — COMPREHENSIVE METABOLIC PANEL
ALT: 22 U/L (ref 0–44)
AST: 23 U/L (ref 15–41)
Albumin: 4.2 g/dL (ref 3.5–5.0)
Alkaline Phosphatase: 86 U/L (ref 38–126)
Anion gap: 8 (ref 5–15)
BUN: 30 mg/dL — ABNORMAL HIGH (ref 8–23)
CO2: 25 mmol/L (ref 22–32)
Calcium: 9.4 mg/dL (ref 8.9–10.3)
Chloride: 106 mmol/L (ref 98–111)
Creatinine, Ser: 1.15 mg/dL — ABNORMAL HIGH (ref 0.44–1.00)
GFR calc Af Amer: 59 mL/min — ABNORMAL LOW (ref >60)
GFR calc non Af Amer: 51 mL/min — ABNORMAL LOW (ref >60)
Glucose, Bld: 106 mg/dL — ABNORMAL HIGH (ref 70–99)
Potassium: 4.2 mmol/L (ref 3.5–5.1)
Sodium: 139 mmol/L (ref 135–145)
Total Bilirubin: 0.7 mg/dL (ref 0.3–1.2)
Total Protein: 7.7 g/dL (ref 6.5–8.1)

## 2019-06-19 LAB — CBC WITH DIFFERENTIAL/PLATELET
Abs Immature Granulocytes: 0.02 10*3/uL (ref 0.00–0.07)
Basophils Absolute: 0.1 10*3/uL (ref 0.0–0.1)
Basophils Relative: 2 %
Eosinophils Absolute: 0.1 10*3/uL (ref 0.0–0.5)
Eosinophils Relative: 3 %
HCT: 39.3 % (ref 36.0–46.0)
Hemoglobin: 12.9 g/dL (ref 12.0–15.0)
Immature Granulocytes: 1 %
Lymphocytes Relative: 16 %
Lymphs Abs: 0.7 10*3/uL (ref 0.7–4.0)
MCH: 30.9 pg (ref 26.0–34.0)
MCHC: 32.8 g/dL (ref 30.0–36.0)
MCV: 94 fL (ref 80.0–100.0)
Monocytes Absolute: 0.3 10*3/uL (ref 0.1–1.0)
Monocytes Relative: 8 %
Neutro Abs: 3 10*3/uL (ref 1.7–7.7)
Neutrophils Relative %: 70 %
Platelets: 120 10*3/uL — ABNORMAL LOW (ref 150–400)
RBC: 4.18 MIL/uL (ref 3.87–5.11)
RDW: 13.2 % (ref 11.5–15.5)
WBC: 4.3 10*3/uL (ref 4.0–10.5)
nRBC: 0 % (ref 0.0–0.2)

## 2019-06-19 LAB — VITAMIN B12: Vitamin B-12: 846 pg/mL (ref 180–914)

## 2019-06-19 LAB — LACTATE DEHYDROGENASE: LDH: 156 U/L (ref 98–192)

## 2019-06-19 LAB — FOLATE: Folate: 46 ng/mL (ref >5.9)

## 2019-06-19 NOTE — Progress Notes (Signed)
Patient here for follow up. Denies any concerns.  

## 2019-06-27 ENCOUNTER — Ambulatory Visit
Admission: RE | Admit: 2019-06-27 | Discharge: 2019-06-27 | Disposition: A | Discharge status: Home / Self Care | Payer: BC Managed Care – PPO | Source: Ambulatory Visit | Attending: Hematology and Oncology | Admitting: Hematology and Oncology

## 2019-06-27 ENCOUNTER — Other Ambulatory Visit: Payer: Self-pay

## 2019-06-27 DIAGNOSIS — R911 Solitary pulmonary nodule: Secondary | ICD-10-CM

## 2019-07-06 ENCOUNTER — Encounter
Admit: 2019-07-06 | Discharge: 2019-07-07 | Payer: PRIVATE HEALTH INSURANCE | Attending: Internal Medicine | Primary: Internal Medicine

## 2019-07-06 DIAGNOSIS — R768 Other specified abnormal immunological findings in serum: Principal | ICD-10-CM

## 2019-07-06 DIAGNOSIS — Z79899 Other long term (current) drug therapy: Principal | ICD-10-CM

## 2019-07-06 DIAGNOSIS — Z23 Encounter for immunization: Principal | ICD-10-CM

## 2019-07-06 MED ORDER — MENACTRA (PF) 4 MCG/0.5 ML INTRAMUSCULAR SOLUTION
Freq: Once | INTRAMUSCULAR | 0 refills | 1 days | Status: CP
Start: 2019-07-06 — End: 2019-07-06

## 2019-07-06 MED ORDER — MENINGOC VAC A,C,Y,W-135 DIP(PF) 10 MCG-5 MCG/0.5 ML INTRAMUSCULAR KIT
Freq: Once | INTRAMUSCULAR | 0 refills | 1.00000 days | Status: CP
Start: 2019-07-06 — End: 2019-07-06

## 2019-07-06 MED ORDER — MENINGOCOCCAL B VAC,4-CMP 50 MCG-50 MCG-50 MCG-25 MCG/0.5ML IM SYRINGE
Freq: Once | INTRAMUSCULAR | 0 refills | 1 days | Status: CP
Start: 2019-07-06 — End: 2019-07-06

## 2019-10-11 ENCOUNTER — Encounter: Payer: Self-pay | Admitting: Family

## 2019-10-11 ENCOUNTER — Ambulatory Visit (INDEPENDENT_AMBULATORY_CARE_PROVIDER_SITE_OTHER): Payer: BC Managed Care – PPO | Admitting: Family

## 2019-10-11 ENCOUNTER — Other Ambulatory Visit: Payer: Self-pay

## 2019-10-11 VITALS — BP 130/64 | HR 78 | Ht 63.0 in | Wt 182.2 lb

## 2019-10-11 DIAGNOSIS — I2510 Atherosclerotic heart disease of native coronary artery without angina pectoris: Secondary | ICD-10-CM

## 2019-10-11 DIAGNOSIS — E782 Mixed hyperlipidemia: Secondary | ICD-10-CM | POA: Diagnosis not present

## 2019-10-11 DIAGNOSIS — I700 Atherosclerosis of aorta: Secondary | ICD-10-CM

## 2019-10-11 DIAGNOSIS — I10 Essential (primary) hypertension: Secondary | ICD-10-CM | POA: Diagnosis not present

## 2019-10-11 DIAGNOSIS — I739 Peripheral vascular disease, unspecified: Secondary | ICD-10-CM

## 2019-10-11 NOTE — Patient Instructions (Addendum)
Medication Instructions:  No medication changes today  *If you need a refill on your cardiac medications before your next appointment, please call your pharmacy*  Lab Work: Your physician recommends that you return for lab work today: lipid panel, direct LDL  If you have labs (blood work) drawn today and your tests are completely normal, you will receive your results only by: [name] Kitchen MyChart Message (if you have MyChart) OR . A paper copy in the mail If you have any lab test that is abnormal or we need to change your treatment, we will call you to review the results.  Testing/Procedures: You had an EKG today. It showed normal sinus rhythm which is a good result!  Follow-Up: At [facility], you and your health needs are our priority.  As part of our continuing mission to provide you with exceptional heart care, we have created designated Provider Care Teams.  These Care Teams include your primary Cardiologist (physician) and Advanced Practice Providers (APPs -  Physician Assistants and Nurse Practitioners) who all work together to provide you with the care you need, when you need it.  Your next appointment:  1 year with Dr. Rockey Situ  Other: Would recommend checking your blood pressure once per week. If it is consistently more than 130/80, let us or Dr. Sherril Cong know.

## 2019-10-11 NOTE — Progress Notes (Signed)
Office Visit    Patient Name: Tina Page Date of Encounter: 10/11/2019  Primary Care Provider:  Frazier Richards, MD Primary Cardiologist:  Ida Rogue, MD Electrophysiologist:  None   Chief Complaint    Tina Page is a 64 y.o. female with a hx of COPD, HLD, PAD s/p PTCA, HTN, hemolytic anemia, aortic atherosclerosis, coronary artery calcification on CT, CKD III presents today for follow up of HTN, atherosclerosis.    Past Medical History    Past Medical History:  Diagnosis Date  . Anemia   . Atherosclerosis of native arteries of extremity with intermittent claudication ([place]) 07/20/2016  . COPD (chronic obstructive pulmonary disease) ([place])   . 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 Elevated liver function tests 11/03/2017  . GERD (gastroesophageal reflux disease)   . Heme positive stool 01/29/2015  . Hepatitis C 07/12/2016  . HLD (hyperlipidemia)   . Hypertension   . Hypothyroidism   . 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 Thrombocytopenia ([place]) 10/04/2016   Past Surgical History:  Procedure Laterality Date  . ELECTROMAGNETIC NAVIGATION BROCHOSCOPY N/A 12/07/2017   Procedure: ELECTROMAGNETIC NAVIGATION BRONCHOSCOPY;  Surgeon: Flora Lipps, MD;  Location: [facility] ORS;  Service: Cardiopulmonary;  Laterality: N/A;  . ESOPHAGOGASTRODUODENOSCOPY (EGD) WITH PROPOFOL N/A 07/08/2016   Procedure: ESOPHAGOGASTRODUODENOSCOPY (EGD) WITH PROPOFOL;  Surgeon: Manya Silvas, MD;  Location: [facility] ENDOSCOPY;  Service: Endoscopy;  Laterality: N/A;  . KNEE SURGERY Right    repair of acl tear  . PERIPHERAL VASCULAR CATHETERIZATION N/A 07/10/2016   Procedure: Lower Extremity Angiography;  Surgeon: Katha Cabal, MD;  Location: [place] [facility];  Service: Cardiovascular;  Laterality: N/A;  . PERIPHERAL VASCULAR CATHETERIZATION N/A 07/10/2016   Procedure: Abdominal Aortogram w/Lower Extremity;  Surgeon: Katha Cabal, MD;  Location: [place] [facility];  Service: Cardiovascular;  Laterality: N/A;  . PERIPHERAL VASCULAR CATHETERIZATION  07/10/2016   Procedure: Lower Extremity Intervention;  Surgeon: Katha Cabal, MD;  Location: [place] [facility];  Service: Cardiovascular;;    Allergies  Allergies  Allergen Reactions  . Codeine Anaphylaxis  . Ciprofloxacin Swelling    Facial swelling following single oral dose.     History of Present Illness    Tina Page is a 64 y.o. female with a hx of  COPD, HLD, PAD s/p PTCA, HTN, hemolytic anemia, aortic atherosclerosis, coronary artery calcification on CT, CKD III last seen 12/13/17 by Dr. Rockey Situ.  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 She additionally follows with pulmonary for lung nodules. Were found to be non-cancerous on bronchoscopy. Follows with Dr. Holley Raring of nephrology.   CT with moderate aortic atherosclerosis particularly in arch and at least moderate coronary calcification in distal LM or bifurcation, proximal LAD, and RCA by Dr. Donivan Scull review.   Orthostatic vitals today were negative. She reports intermittent lightheadedness while at work. Works Systems analyst at [employer]. Tells me she does not drink much water at work and we discussed that dehydration could be contributory.   No chest pain, pressure, tightness. No SOB nor DOE.   EKGs/Labs/Other Studies Reviewed:   The following studies were reviewed today:  EKG:  EKG is  ordered today.  The ekg ordered today demonstrates sinus rhythm 78 bpm with no acute ST/T wave changes  Recent Labs: 06/19/2019: ALT 22; BUN 30; Creatinine, Ser 1.15; Hemoglobin 12.9; Platelets 120; Potassium 4.2; Sodium 139  Recent Lipid Panel No results found for: CHOL, TRIG,  HDL, CHOLHDL, VLDL, LDLCALC, LDLDIRECT  Home Medications   Current Meds  Medication Sig  . albuterol (PROVENTIL HFA;VENTOLIN HFA) 108 (90 Base) MCG/ACT inhaler Inhale 2 puffs into the lungs every 6 (six) hours as needed for wheezing or  shortness of breath.   . allopurinol (ZYLOPRIM) 100 MG tablet TAKE 1 TABLET BY MOUTH EVERY DAY  . clopidogrel (PLAVIX) 75 MG tablet Take 1 tablet (75 mg total) by mouth daily.  . cyanocobalamin 500 MCG tablet Take 500 mcg by mouth daily.  . folic acid (FOLVITE) 1 MG tablet TAKE 1 TABLET (1 MG TOTAL) BY MOUTH DAILY.  . hydrochlorothiazide (HYDRODIURIL) 25 MG tablet Take 25 mg by mouth daily.  [name] Kitchen levothyroxine (SYNTHROID, LEVOTHROID) 75 MCG tablet Take 75 mcg by mouth daily before breakfast.   . lisinopril (PRINIVIL,ZESTRIL) 20 MG tablet Take 20 mg by mouth daily.   [name] Kitchen omeprazole (PRILOSEC) 20 MG capsule Take 1 capsule (20 mg total) by mouth daily.  . potassium chloride (KLOR-CON) 20 MEQ packet Take 20 mEq by mouth daily.  . pravastatin (PRAVACHOL) 40 MG tablet Take 40 mg by mouth every evening.      Review of Systems      Review of Systems  Constitution: Negative for chills, fever and malaise/fatigue.  Cardiovascular: Negative for chest pain, dyspnea on exertion, leg swelling, near-syncope, orthopnea, palpitations and syncope.  Respiratory: Negative for cough, shortness of breath and wheezing.   Gastrointestinal: Negative for nausea and vomiting.  Neurological: Positive for light-headedness. Negative for dizziness and weakness.   All other systems reviewed and are otherwise negative except as noted above.  Physical Exam    VS:  BP 130/64 (BP Location: Left Arm, Patient Position: Sitting, Cuff Size: Normal)   Pulse 78   Ht 5\' 3"  (1.6 m)   Wt 182 lb 4 oz (82.7 kg)   SpO2 98%   BMI 32.28 kg/m  , BMI Body mass index is 32.28 kg/m. GEN: Well nourished, well developed, in no acute distress. HEENT: normal. Neck: Supple, no JVD, carotid bruits, or masses. Cardiac: RRR, no murmurs, rubs, or gallops. No clubbing, cyanosis, edema.  Radials/PT 2+ and equal bilaterally.  Respiratory:  Respirations regular and unlabored.  Lower lobes diminished bilaterally. GI: Soft, nontender,  nondistended. MS: No deformity or atrophy. Skin: Warm and dry, no rash. Neuro:  Strength and sensation are intact. Psych: Normal affect.  Accessory Clinical Findings    ECG personally reviewed by me today -sinus rhythm 78 bpm with no acute ST/T wave changes  Assessment & Plan    1. Coronary artery calcification on CT/Aortic atherosclerosis - No noted anginal symptoms. Continue Plavix, Pravastatin. Lipid panel today for monitoring.  2. HTN - BP well controlled. Continue HCTZ 25mg  daily and Lisinopril 20mg  daily. 3. CKDIII - Follows closely with nephrology. Continue Lisinopril for renal protection.  4. HLD - Lipid panel today. Continue Pravastatin 40mg . LDL goal <100 in setting of aortic atherosclerosis and coronary artery calcification.  5. PAD - No symptoms of claudication. Continue medical management including Plavix and lipid control.   Disposition: Follow up in 1 year(s) with Dr. Rockey Situ or APP.   Loel Dubonnet, NP 10/11/2019, 4:37 PM

## 2019-10-12 ENCOUNTER — Encounter: Payer: Self-pay | Admitting: Family

## 2019-10-12 ENCOUNTER — Encounter: Payer: Self-pay | Admitting: *Deleted

## 2019-10-12 LAB — LIPID PANEL
Chol/HDL Ratio: 4.7 ratio — ABNORMAL HIGH (ref 0.0–4.4)
Cholesterol, Total: 168 mg/dL (ref 100–199)
HDL: 36 mg/dL — ABNORMAL LOW (ref >39)
LDL Chol Calc (NIH): 104 mg/dL — ABNORMAL HIGH (ref 0–99)
Triglycerides: 160 mg/dL — ABNORMAL HIGH (ref 0–149)
VLDL Cholesterol Cal: 28 mg/dL (ref 5–40)

## 2019-10-12 LAB — LDL CHOLESTEROL, DIRECT: LDL Direct: 102 mg/dL — ABNORMAL HIGH (ref 0–99)

## 2019-10-18 NOTE — Progress Notes (Signed)
[facility]  [address] [place], [place] [address] Phone: [phone]  Fax: [phone]   Telemedicine Office Visit:  10/23/2019  Referring physician: Frazier Richards, MD  I connected with Tina Page on 10/23/2019 at 9:30 AM by videoconferencing and verified that I was speaking with the correct person using 2 identifiers.  The patient was at home.  I discussed the limitations, risk, security and privacy concerns of performing an evaluation and management service by videoconferencing and the availability of in person appointments.  I also discussed with the patient that there may be a patient responsible charge related to this service.  The patient expressed understanding and agreed to proceed.   Chief Complaint: Tina Page is a 64 y.o. female with a right middle lobe mass, warm autoimmune hemolytic anemia, and renal insufficiency who is seen for her 4 month assessment and review of chest CT.   HPI: The patient was last seen in the hematology clinic on 06/19/2019. At that time, she was recovering from a sinus and chest infection. She was on doxycycline. Exam was unremarkable. Hematocrit 39.3, hemoglobin 12.9, platelets 120,000, WBC 4,300. Creatinine was 1.15. Vitamin B12 was 846 with folate 46.0. LDH was 156. We planned for a chest CT on 06/27/2019.   Chest CT on 06/27/2019 revealed a previously described nodule of the right middle lobe had almost completely resolved, with residual linear scarring (series 3, image 88). There was persistent paramedian consolidation of the medial right middle lobe and lingula (series 3, image 92), generally in keeping with atypical infection, particularly atypical mycobacterium. There had been interval increase in size of a small nodule of the right middle lobe, now measuring 4 mm, previously 2 mm (series 3, image 80). New ground-glass opacity of the lateral left upper lobe measuring 6 mm (series 3, image 61).  Although nonspecific these were likely infectious or inflammatory in nature. Attention on follow-up. There was unchanged tiny biapical pulmonary nodules, benign sequelae of nonspecific infection or inflammation, possibly related cigarette smoking, atypical infection, or other inhalational, inflammatory lung disease. There was hepatic steatosis.  She was seen in infectious disease at [facility] by Dr. Gabriel Page on 07/06/2019. She was tolerating entecavir without side effects so far with stable renal function. Entecavir was stopped. She received the Menveo (quadrivalent meningococcal vaccination) and Bexsero (univalent serogroup B meningococcal vaccination).  It was recommended that she receive the second meningococcal conjugate (Menveo) in at least 8 weeks from 07/06/2019.   She had a telemedicine visit with Dr. Holley Page on 09/14/2019. Her kidney function was stable. She was avoiding NSAIDs. Patient was to follow up in 2 weeks.   She was seen in cardiology by Tina Montana, NP on 10/11/2019. She had intermittent lightheadedness while at work. She was not drinking a lot of fluids. She had no chest pain, tightness or shortness of breath. She continued her current regimen.    Labs on 10/20/2019 revealed a hematocrit 39.1, hemoglobin 12.5, platelets 127,000, WBC 5,100 (ANC 3300). Creatinine was 1.30. LDH was 158. Folate was  >100.0.   During the interim, she has felt "tired". She is working 40 hours a week on third shift. She notes that she did not receive her second meningococcal vaccine. She reports feeling lightheaded when she bends over. She notes weight gain. She has no cough and congestion. Her abdominal pain has improved. Her bowels change depending on what she eats.   Her folate was >100.0. She is taking 1 mg folic acid a  day.   She notes that she tested positive for COVID-19 on 08/21/2019 secondary to exposure from her daughter and son-in-law. Her symptoms were mild. Patient is interested in getting the  COVID vaccine.   Past Medical History:  Diagnosis Date  . Anemia   . Atherosclerosis of native arteries of extremity with intermittent claudication ([place]) 07/20/2016  . COPD (chronic obstructive pulmonary disease) ([place])   . 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 Elevated liver function tests 11/03/2017  . GERD (gastroesophageal reflux disease)   . Heme positive stool 01/29/2015  . Hepatitis C 07/12/2016  . HLD (hyperlipidemia)   . Hypertension   . Hypothyroidism   . 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 Thrombocytopenia ([place]) 10/04/2016    Past Surgical History:  Procedure Laterality Date  . ELECTROMAGNETIC NAVIGATION BROCHOSCOPY N/A 12/07/2017   Procedure: ELECTROMAGNETIC NAVIGATION BRONCHOSCOPY;  Surgeon: Flora Lipps, MD;  Location: [facility] ORS;  Service: Cardiopulmonary;  Laterality: N/A;  . ESOPHAGOGASTRODUODENOSCOPY (EGD) WITH PROPOFOL N/A 07/08/2016   Procedure: ESOPHAGOGASTRODUODENOSCOPY (EGD) WITH PROPOFOL;  Surgeon: Manya Silvas, MD;  Location: [facility] ENDOSCOPY;  Service: Endoscopy;  Laterality: N/A;  . KNEE SURGERY Right    repair of acl tear  . PERIPHERAL VASCULAR CATHETERIZATION N/A 07/10/2016   Procedure: Lower Extremity Angiography;  Surgeon: Katha Cabal, MD;  Location: [place] CV LAB;  Service: Cardiovascular;  Laterality: N/A;  . PERIPHERAL VASCULAR CATHETERIZATION N/A 07/10/2016   Procedure: Abdominal Aortogram w/Lower Extremity;  Surgeon: Katha Cabal, MD;  Location: [place] [facility] LAB;  Service: Cardiovascular;  Laterality: N/A;  . PERIPHERAL VASCULAR CATHETERIZATION  07/10/2016   Procedure: Lower Extremity Intervention;  Surgeon: Katha Cabal, MD;  Location: [place] [facility] LAB;  Service: Cardiovascular;;    Family History  Problem Relation Age of Onset  . Diabetes Mother   . Hypertension Mother   . Diabetes Maternal Grandfather   . Hypertension Maternal Grandfather     Social History:  reports that  she quit smoking about 3 years ago. Her smoking use included cigarettes. She smoked 0.50 packs per day. She has never used smokeless tobacco. She reports that she does not drink alcohol or use drugs. She has a 21 pack year smoking history (1/2 pack/day from age [age]). The patient works at [employer] She is exposed to cold temperatures. She works 3rd shift. She has a daughter, [name] and a daughter named [name]. She lives in [place]. The patient is alone today.  Participants in the patient's visit and their role in the encounter included the patient and Vito Berger, CMA today.  The intake visit was provided by Vito Berger, CMA.  Allergies:  Allergies  Allergen Reactions  . Codeine Anaphylaxis  . Ciprofloxacin Swelling    Facial swelling following single oral dose.     Current Medications: Current Outpatient Medications  Medication Sig Dispense Refill  . albuterol (PROVENTIL HFA;VENTOLIN HFA) 108 (90 Base) MCG/ACT inhaler Inhale 2 puffs into the lungs every 6 (six) hours as needed for wheezing or shortness of breath.     . allopurinol (ZYLOPRIM) 100 MG tablet TAKE 1 TABLET BY MOUTH EVERY DAY 30 tablet 5  . clopidogrel (PLAVIX) 75 MG tablet Take 1 tablet (75 mg total) by mouth daily. 30 tablet 5  . cyanocobalamin 500 MCG tablet Take 500 mcg by mouth daily.    . folic acid (FOLVITE) 1 MG tablet TAKE 1 TABLET (1 MG TOTAL) BY MOUTH DAILY. 90 tablet 1  .  hydrochlorothiazide (HYDRODIURIL) 25 MG tablet Take 25 mg by mouth daily.    [name] Kitchen levothyroxine (SYNTHROID, LEVOTHROID) 75 MCG tablet Take 75 mcg by mouth daily before breakfast.     . lisinopril (PRINIVIL,ZESTRIL) 20 MG tablet Take 20 mg by mouth daily.     [name] Kitchen omeprazole (PRILOSEC) 20 MG capsule Take 1 capsule (20 mg total) by mouth daily. 60 capsule 0  . potassium chloride (KLOR-CON) 20 MEQ packet Take 20 mEq by mouth daily.    . pravastatin (PRAVACHOL) 40 MG tablet Take 40 mg by mouth every evening.     No  current facility-administered medications for this visit.    Review of Systems  Constitutional: Positive for malaise/fatigue (chronic). Negative for chills, fever and weight loss (gaining).       Feels "tired".  HENT: Negative.  Negative for congestion, ear pain, hearing loss, nosebleeds and sore throat.   Eyes: Negative.  Negative for blurred vision and double vision.  Respiratory: Negative.  Negative for cough, hemoptysis, sputum production, shortness of breath and stridor.   Cardiovascular: Negative.  Negative for chest pain, palpitations and leg swelling.  Gastrointestinal: Negative.  Negative for abdominal pain (LUQ, comes and goes; improved), constipation, diarrhea (depends on what she eats), heartburn, nausea and vomiting.  Genitourinary: Negative.  Negative for dysuria, frequency and urgency.  Musculoskeletal: Negative.  Negative for back pain, joint pain and myalgias.  Skin: Negative.  Negative for rash.  Neurological: Negative.  Negative for dizziness, tingling, sensory change, focal weakness, weakness and headaches.       Lightheaded when bending over.  Endo/Heme/Allergies: Negative.  Does not bruise/bleed easily.  Psychiatric/Behavioral: Negative.  Negative for depression and memory loss. The patient is not nervous/anxious and does not have insomnia.   All other systems reviewed and are negative.   Performance status (ECOG): 1  Physical Exam  Constitutional: She is oriented to person, place, and time. She appears well-developed and well-nourished. No distress.  HENT:  Head: Normocephalic and atraumatic.  Gray hair pulled back.  Eyes: Conjunctivae and EOM are normal. No scleral icterus.  Glasses.  Blue eyes.  Neurological: She is alert and oriented to person, place, and time.  Skin: She is not diaphoretic.  Psychiatric: She has a normal mood and affect. Her behavior is normal. Judgment and thought content normal.  Nursing note and vitals reviewed.   No visits with  results within 3 Day(s) from this visit.  Latest known visit with results is:  Appointment on 10/20/2019  Component Date Value Ref Range Status  . LDH 10/20/2019 158  98 - 192 U/L Final   Performed at [facility], [address], [place], [place] [address]  . Folate 10/20/2019 >100.0  >5.9 ng/mL Final   Comment: RESULT CONFIRMED BY MANUAL DILUTION Performed at [facility], [address] [place], [place] [address]   . Sodium 10/20/2019 137  135 - 145 mmol/L Final  . Potassium 10/20/2019 4.1  3.5 - 5.1 mmol/L Final  . Chloride 10/20/2019 102  98 - 111 mmol/L Final  . CO2 10/20/2019 30  22 - 32 mmol/L Final  . Glucose, Bld 10/20/2019 88  70 - 99 mg/dL Final  . BUN 10/20/2019 34* 8 - 23 mg/dL Final  . Creatinine, Ser 10/20/2019 1.30* 0.44 - 1.00 mg/dL Final  . Calcium 10/20/2019 9.4  8.9 - 10.3 mg/dL Final  . Total Protein 10/20/2019 7.5  6.5 - 8.1 g/dL Final  . Albumin 10/20/2019 4.2  3.5 - 5.0 g/dL Final  .  AST 10/20/2019 26  15 - 41 U/L Final  . ALT 10/20/2019 26  0 - 44 U/L Final  . Alkaline Phosphatase 10/20/2019 79  38 - 126 U/L Final  . Total Bilirubin 10/20/2019 0.8  0.3 - 1.2 mg/dL Final  . GFR calc non Af Amer 10/20/2019 44* >60 mL/min Final  . GFR calc Af Amer 10/20/2019 51* >60 mL/min Final  . Anion gap 10/20/2019 5  5 - 15 Final   Performed at [facility], [address] [place], [place] [address]  . WBC 10/20/2019 5.1  4.0 - 10.5 K/uL Final  . RBC 10/20/2019 3.99  3.87 - 5.11 MIL/uL Final  . Hemoglobin 10/20/2019 12.5  12.0 - 15.0 g/dL Final  . HCT 10/20/2019 39.1  36.0 - 46.0 % Final  . MCV 10/20/2019 98.0  80.0 - 100.0 fL Final  . MCH 10/20/2019 31.3  26.0 - 34.0 pg Final  . MCHC 10/20/2019 32.0  30.0 - 36.0 g/dL Final  . RDW 10/20/2019 13.2  11.5 - 15.5 % Final  . Platelets 10/20/2019 127* 150 - 400 K/uL Final  . nRBC 10/20/2019 0.0  0.0 - 0.2 % Final  . Neutrophils Relative % 10/20/2019 65  % Final  . Neutro Abs 10/20/2019  3.3  1.7 - 7.7 K/uL Final  . Lymphocytes Relative 10/20/2019 23  % Final  . Lymphs Abs 10/20/2019 1.2  0.7 - 4.0 K/uL Final  . Monocytes Relative 10/20/2019 8  % Final  . Monocytes Absolute 10/20/2019 0.4  0.1 - 1.0 K/uL Final  . Eosinophils Relative 10/20/2019 3  % Final  . Eosinophils Absolute 10/20/2019 0.1  0.0 - 0.5 K/uL Final  . Basophils Relative 10/20/2019 1  % Final  . Basophils Absolute 10/20/2019 0.1  0.0 - 0.1 K/uL Final  . Immature Granulocytes 10/20/2019 0  % Final  . Abs Immature Granulocytes 10/20/2019 0.01  0.00 - 0.07 K/uL Final   Performed at [facility], [address], [place], [place] [address]    Assessment:  LOANNE LAMARRE is a 64 y.o. 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 One week prior to presentation, she felt like she was coming down with something. She denied any fever, runny nose, sore throat or cough. She had some diarrhea. She denied any new medications or herbal products.  Anemia workuprevealed a cold autoantibody (IgG and complement). Reticulocyte count was 11.3%. Ferritin was 562. Iron studies revealed a saturation of 20% and a TIBC of 214 (low). B12 was 231 (low normal). Folate was 42. Peripheral smear revealed rouleaux formation. Labs on 06/21/2019revealed normal flow cytometry, SPEP, and immunoglobulins.  Additional testingincluded the following+ studies: hepatitis C antibody,hepatitis B core antibody,CMV IgM, and EBV VCA (IgM and IgG). Hepatitis B by PCRwas negative. Hepatitis C RNAwas negative. Mycoplasma pneumonia IgM was negative. Reticulocyte count was 11.3% (high) indicating appropriate marrow response. C3 and C4 were normal. Cold agglutinin titer was negative x 2. Negative studies included: ANA, hepatitis B surface antigen, SPEP, and free light chain ratio. 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 MMA was initially 330 (normal). Repeat MMA was elevated on 08/01/2016 confirming B12 deficiency. Hepatitis B surface antibody was positive and hepatitis B surface antigen was negative on 08/06/2016.  Chest, abdomen, and pelvis CTangiogram on 07/07/2016 revealed moderate diffuse atherosclerotic vascular disease of the abdominal aorta with severe stenosis of the left common iliac artery with suspected short  segment occlusion. There was no adenopathy. Spleen was normal. Abdominal ultrasoundon 04/15/2017 revealed a normal spleen and sludge in the gallbladder. The liver was echogenic consistent fatty infiltration and/or hepatocellular disease.  She underwent PTCA and stent placementin right and left common iliac arteries and left external iliac artery on 07/10/2016.She is on Plavix.EGD on 03/19/2015 revealed gastritis in the body and antrum. Colonoscopyon 03/19/2015 revealed one hyperplastic polyp. EGDon 07/08/2016 was normal. No evidence of bleeding.  She has received6 units of warmed PRBCsto date (last on 08/01/2016).If Rituxan is needed, she requires entecavir 0.5 mg q day beginning 2 weeks prior to Rituxan. In addition, hepatitis B viral level and LFTs should be checked monthly.  She began steroids(1 mg/kg) on 08/01/2016. She stopped prednisoneon 03/12/2017. She is onfolic acid1 mg a day.   She hasB12 deficiency. B12 was 231 on 07/09/2016. She is on oral B12. B12 and folate were normal on 03/09/2017 and 10/12/2017.  She developed peri-oral and intranasalherpes simplex-1. She was treated with valacyclovir and doxycycline on 10/19/2016. She developed a transient elevated alkaline phosphataseon 10/19/2016 which subsequently normalized.  She was documented to have awarm autoantibodyon 10/12/2017. LDH and bilirubin were normal. Hematocrit had improved from prior testing.   She developedflu like symptomson 10/26/2017. Symptoms included cough,  myalgias, and fever (tmax unknown). She was prescribed Mucinex, Tamiflu, and amoxicillin. She only took amoxicillin x 5 days. She developedincreased liver function testson 11/02/2017. CMV IgG was positive. EBV VCA IgG, NA IgG, early antigen antibody IgG were elevated. EBV VCA IgM was <36. Testing was c/w a convalescence/past infection or reactivated infection. LFTs normalized on 11/15/2017. Smooth muscle antibodywas 39 (high) on 11/10/2017 and 34 (high) on 11/30/2017.   Abdomen and pelvic CTon 11/10/2017 revealed a 2.2 cm spiculated density in right middle lobe concerning for malignancy. Chest CTon 11/16/2017 revealed a 2.2 x 2.0 x 2.4 spiculated RIGHT middle lobe mass. There was tiny nonspecific upper lobe pulmonary nodules greater on RIGHT, largest 3 mm, of uncertain etiology. There was additional 8 x 8 mm opacity in the posterior sulcus of the RIGHT lower lobe.  PET scanon 11/22/2017 revealed a 2 cm hypermetabolic right middle lobe lung mass (SUV 3.4) consistent with primary lung neoplasm. There were no findings for mediastinal/hilar lymphadenopathy or metastatic disease. There were areas of hypermetabolism involving the left oblique abdominal muscles and the anorectal junction.  PFTs on 11/18/2017 revealed an FEV1 of 1.22 liters (51%). DLCO adj was 6.8 mL/mmHg/min (32%).  ENB done on 12/07/2017. Cytology was negative for malignancy. Pathologydemonstrated organizing pneumonia and chronic bronchitis. Pathologist commented that organizing pneumonia spanned about 2 mm in one fragment. Cases of focal organizing pneumonia with hypermetabolism on FDG-PET have been described, but changes of this type can also be adjacent to a neoplasm. Patient prescribed a daily dose of Prednisone 10 mg, with re-imaging planned in 6-8 weeks.   Chest CT on 02/06/2018 revealed the spiculated 2.2 cm right middle lobe lesion was almost completely resolved with only a residual 4 mm irregular nodule  identified at this location on a background of architectural distortion/evolving scar. There was a 5 mm right parahilar nodule stable since 11/16/2017(not present on 07/07/2016). There were tiny nodules in both upper lobes suggesting inhalation etiology.  Chest CT on 06/27/2019 revealed the nodule of the right middle lobe had almost completely resolved, with residual linear scarring. There was persistent paramedian consolidation of the medial right middle lobe and lingula, generally in keeping with atypical infection, particularly atypical mycobacterium. There had been interval increase in size  of a small nodule of the right middle lobe, now measuring 4 mm (previously 2 mm).   There was new 6 mm ground-glass opacity of the lateral left upper lobe measuring.  Although nonspecific, these were likely infectious or inflammatory in nature.  Attention on follow-up was receommended. There was unchanged tiny biapical pulmonary nodules, benign sequelae of nonspecific infection or inflammation, possibly related cigarette smoking, atypical infection, or other inhalational, inflammatory lung disease. There was hepatic steatosis.  She began prednisone10 mg on 12/07/2017 (discontinued 03/2018).She received Rituxanweekly x 4 (last dose 06/16/2018).  She has diarrhea. 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 She received azithromycin x 3 days. She received ciprofloxacin x 10 days without improvement. She has seen ID in [place]. She began Bactrim on 03/04/2018 (discontinued on 03/11/2018) secondary to increase in creatine.  She has renal insufficiency. Creatininehas been followed: 1.17 on 11/05/2017, 1.85 on 11/09/2017, 1.38 on 11/12/2017, 1.74 on 11/15/2017, 1.66 on 11/17/2017, and 1.48 on 11/23/2017. Urinalysis on 11/15/2017 revealed revealed no hemoglobin, bilirubin or active sediment.  Liver function tests increased on 02/15/2019.  Work-up on 02/16/2019 revealed  hepatitis B E antibody positive.  Negative studies included: hepatitis A total antibody, hepatitis A IgM, hepatitis B surface antigen, hepatitis B E antigen.  Hepatitis B DNA was not detected.  Folate was 80.5 and vitamin B12 was 443.  She has received her vaccinations:  She has completed the PCV13, PPSV23, and HiB.  She received Menvio (quadrivalent meningoccal vaccine) and Bexero (univalent serogroup B meningoccal vaccine) on 07/06/2019.  She notes that she tested positive for COVID-19 on 08/21/2019   Symptomatically, she is fatigued.  She denies any bruising or bleeding.  She denies any respiratory symptoms.  Plan: 1.   Review labs from 10/20/2019.  2.Hemolytic anemia Clinically, she is doing well.       Hematocrit39.1. Hemoglobin 12.5.LDH is 158 (normal). No evidence of hemolysis. 3.Hepatitis B Patient was previously exposed to hepatitis B. She was on entecavir x 1 year (stopped 07/06/2019). Patient did not complete her meningococcal vaccines.   Patient to call ID at [facility] to confirm vaccines needed and received.  4. Elevated liver function tests, resolved AST 26.  ALT 26.  Bilirubin 0.8.  Alkaline phosphatase 79  on 10/20/2019. Etiology of prior spike in liver enzymes unclear.       Continue to monitor.  5.B12 deficiency B12 was 443 on 02/15/2019 and 846 on 06/19/2019.  Folate was 80.5 on 02/15/2019 and > 100 on 10/20/2019.   Hold folate x 2 weeks then every other day (3 times a week).             Follow-up B12 and folate levels. 6. Right middle lobe nodule Review chest CT from 06/27/2019.   Images reviewed.  Agree with radiology interpretation.   Overall improved.   RML nodule has increased from 2 to 4 mm (unclear significance).   LUL 6 mm ground glass opacity (new).  Continue to monitor. 7.   Schedule chest CT on 12/28/2019. 8.   RTC after chest CT for MD assessment, labs (CBC with diff, CMP,  LDH, B12, folate), and review of chest CT.   I discussed the assessment and treatment plan with the patient.  The patient was provided an opportunity to ask questions and all were answered.  The patient agreed with the plan and demonstrated an understanding of the instructions.  The patient was advised to call back or seek an in person evaluation if the symptoms worsen or  if the condition fails to improve as anticipated.  I provided 16 minutes (9:30 AM - 9:46 AM) of face-to-face video visit time during this this encounter and > 50% was spent counseling as documented under my assessment and plan.  An additional 10 minutes were spent reviewing her chart (Epic and Care Everywhere) including notes, labs, and imaging studies. I provided these services from the [facility] office.   Nolon Stalls, MD, PhD  10/23/2019, 9:30 AM  I, Selena Batten, am acting as scribe for [place]. Mike Gip, MD, PhD.  I, Jakobe Blau C. Mike Gip, MD, have reviewed the above documentation for accuracy and completeness, and I agree with the above.

## 2019-10-20 ENCOUNTER — Inpatient Hospital Stay: Payer: BC Managed Care – PPO | Attending: Hematology and Oncology

## 2019-10-20 ENCOUNTER — Other Ambulatory Visit: Payer: Self-pay

## 2019-10-20 DIAGNOSIS — E039 Hypothyroidism, unspecified: Secondary | ICD-10-CM | POA: Diagnosis not present

## 2019-10-20 DIAGNOSIS — D5911 Warm autoimmune hemolytic anemia: Secondary | ICD-10-CM | POA: Diagnosis not present

## 2019-10-20 DIAGNOSIS — Z7902 Long term (current) use of antithrombotics/antiplatelets: Secondary | ICD-10-CM | POA: Diagnosis not present

## 2019-10-20 DIAGNOSIS — D591 Autoimmune hemolytic anemia, unspecified: Secondary | ICD-10-CM

## 2019-10-20 DIAGNOSIS — I10 Essential (primary) hypertension: Secondary | ICD-10-CM | POA: Insufficient documentation

## 2019-10-20 DIAGNOSIS — R918 Other nonspecific abnormal finding of lung field: Secondary | ICD-10-CM | POA: Insufficient documentation

## 2019-10-20 DIAGNOSIS — N289 Disorder of kidney and ureter, unspecified: Secondary | ICD-10-CM | POA: Diagnosis not present

## 2019-10-20 DIAGNOSIS — R911 Solitary pulmonary nodule: Secondary | ICD-10-CM

## 2019-10-20 DIAGNOSIS — Z79899 Other long term (current) drug therapy: Secondary | ICD-10-CM | POA: Diagnosis not present

## 2019-10-20 DIAGNOSIS — E538 Deficiency of other specified B group vitamins: Secondary | ICD-10-CM | POA: Diagnosis not present

## 2019-10-20 DIAGNOSIS — E785 Hyperlipidemia, unspecified: Secondary | ICD-10-CM | POA: Insufficient documentation

## 2019-10-20 DIAGNOSIS — Z833 Family history of diabetes mellitus: Secondary | ICD-10-CM | POA: Insufficient documentation

## 2019-10-20 DIAGNOSIS — Z8616 Personal history of COVID-19: Secondary | ICD-10-CM | POA: Diagnosis not present

## 2019-10-20 DIAGNOSIS — F17210 Nicotine dependence, cigarettes, uncomplicated: Secondary | ICD-10-CM | POA: Diagnosis not present

## 2019-10-20 DIAGNOSIS — J449 Chronic obstructive pulmonary disease, unspecified: Secondary | ICD-10-CM | POA: Insufficient documentation

## 2019-10-20 DIAGNOSIS — Z8249 Family history of ischemic heart disease and other diseases of the circulatory system: Secondary | ICD-10-CM | POA: Insufficient documentation

## 2019-10-20 DIAGNOSIS — R7989 Other specified abnormal findings of blood chemistry: Secondary | ICD-10-CM

## 2019-10-20 DIAGNOSIS — R768 Other specified abnormal immunological findings in serum: Secondary | ICD-10-CM

## 2019-10-20 LAB — COMPREHENSIVE METABOLIC PANEL
ALT: 26 U/L (ref 0–44)
AST: 26 U/L (ref 15–41)
Albumin: 4.2 g/dL (ref 3.5–5.0)
Alkaline Phosphatase: 79 U/L (ref 38–126)
Anion gap: 5 (ref 5–15)
BUN: 34 mg/dL — ABNORMAL HIGH (ref 8–23)
CO2: 30 mmol/L (ref 22–32)
Calcium: 9.4 mg/dL (ref 8.9–10.3)
Chloride: 102 mmol/L (ref 98–111)
Creatinine, Ser: 1.3 mg/dL — ABNORMAL HIGH (ref 0.44–1.00)
GFR calc Af Amer: 51 mL/min — ABNORMAL LOW (ref >60)
GFR calc non Af Amer: 44 mL/min — ABNORMAL LOW (ref >60)
Glucose, Bld: 88 mg/dL (ref 70–99)
Potassium: 4.1 mmol/L (ref 3.5–5.1)
Sodium: 137 mmol/L (ref 135–145)
Total Bilirubin: 0.8 mg/dL (ref 0.3–1.2)
Total Protein: 7.5 g/dL (ref 6.5–8.1)

## 2019-10-20 LAB — CBC WITH DIFFERENTIAL/PLATELET
Abs Immature Granulocytes: 0.01 10*3/uL (ref 0.00–0.07)
Basophils Absolute: 0.1 10*3/uL (ref 0.0–0.1)
Basophils Relative: 1 %
Eosinophils Absolute: 0.1 10*3/uL (ref 0.0–0.5)
Eosinophils Relative: 3 %
HCT: 39.1 % (ref 36.0–46.0)
Hemoglobin: 12.5 g/dL (ref 12.0–15.0)
Immature Granulocytes: 0 %
Lymphocytes Relative: 23 %
Lymphs Abs: 1.2 10*3/uL (ref 0.7–4.0)
MCH: 31.3 pg (ref 26.0–34.0)
MCHC: 32 g/dL (ref 30.0–36.0)
MCV: 98 fL (ref 80.0–100.0)
Monocytes Absolute: 0.4 10*3/uL (ref 0.1–1.0)
Monocytes Relative: 8 %
Neutro Abs: 3.3 10*3/uL (ref 1.7–7.7)
Neutrophils Relative %: 65 %
Platelets: 127 10*3/uL — ABNORMAL LOW (ref 150–400)
RBC: 3.99 MIL/uL (ref 3.87–5.11)
RDW: 13.2 % (ref 11.5–15.5)
WBC: 5.1 10*3/uL (ref 4.0–10.5)
nRBC: 0 % (ref 0.0–0.2)

## 2019-10-20 LAB — FOLATE: Folate: >100 ng/mL (ref >5.9)

## 2019-10-20 LAB — LACTATE DEHYDROGENASE: LDH: 158 U/L (ref 98–192)

## 2019-10-23 ENCOUNTER — Encounter: Payer: Self-pay | Admitting: Hematology and Oncology

## 2019-10-23 ENCOUNTER — Inpatient Hospital Stay (HOSPITAL_BASED_OUTPATIENT_CLINIC_OR_DEPARTMENT_OTHER): Payer: BC Managed Care – PPO | Admitting: Hematology and Oncology

## 2019-10-23 ENCOUNTER — Other Ambulatory Visit: Payer: BC Managed Care – PPO

## 2019-10-23 DIAGNOSIS — Z79899 Other long term (current) drug therapy: Secondary | ICD-10-CM

## 2019-10-23 DIAGNOSIS — Z8249 Family history of ischemic heart disease and other diseases of the circulatory system: Secondary | ICD-10-CM

## 2019-10-23 DIAGNOSIS — R911 Solitary pulmonary nodule: Secondary | ICD-10-CM | POA: Diagnosis not present

## 2019-10-23 DIAGNOSIS — D5912 Cold autoimmune hemolytic anemia: Secondary | ICD-10-CM

## 2019-10-23 DIAGNOSIS — R5383 Other fatigue: Secondary | ICD-10-CM | POA: Diagnosis not present

## 2019-10-23 DIAGNOSIS — E538 Deficiency of other specified B group vitamins: Secondary | ICD-10-CM | POA: Diagnosis not present

## 2019-10-23 DIAGNOSIS — K219 Gastro-esophageal reflux disease without esophagitis: Secondary | ICD-10-CM

## 2019-10-23 DIAGNOSIS — Z833 Family history of diabetes mellitus: Secondary | ICD-10-CM

## 2019-10-23 DIAGNOSIS — J449 Chronic obstructive pulmonary disease, unspecified: Secondary | ICD-10-CM

## 2019-10-23 DIAGNOSIS — Z87891 Personal history of nicotine dependence: Secondary | ICD-10-CM

## 2019-10-23 DIAGNOSIS — R635 Abnormal weight gain: Secondary | ICD-10-CM

## 2019-10-23 DIAGNOSIS — I10 Essential (primary) hypertension: Secondary | ICD-10-CM

## 2019-10-23 DIAGNOSIS — E785 Hyperlipidemia, unspecified: Secondary | ICD-10-CM

## 2019-10-23 DIAGNOSIS — E039 Hypothyroidism, unspecified: Secondary | ICD-10-CM

## 2019-10-23 NOTE — Progress Notes (Signed)
No new changes noted today. The patient Name and DOB has been verified by phone today. 

## 2019-11-20 ENCOUNTER — Other Ambulatory Visit: Payer: Self-pay | Admitting: Family Medicine

## 2019-11-20 DIAGNOSIS — Z1231 Encounter for screening mammogram for malignant neoplasm of breast: Secondary | ICD-10-CM

## 2019-11-22 ENCOUNTER — Ambulatory Visit
Admission: RE | Admit: 2019-11-22 | Discharge: 2019-11-22 | Disposition: A | Discharge status: Home / Self Care | Payer: BC Managed Care – PPO | Source: Ambulatory Visit | Attending: Family Medicine | Admitting: Family Medicine

## 2019-11-22 ENCOUNTER — Other Ambulatory Visit: Payer: Self-pay

## 2019-11-22 DIAGNOSIS — Z1231 Encounter for screening mammogram for malignant neoplasm of breast: Secondary | ICD-10-CM | POA: Diagnosis present

## 2019-12-07 ENCOUNTER — Ambulatory Visit (INDEPENDENT_AMBULATORY_CARE_PROVIDER_SITE_OTHER): Payer: BC Managed Care – PPO

## 2019-12-07 ENCOUNTER — Ambulatory Visit (INDEPENDENT_AMBULATORY_CARE_PROVIDER_SITE_OTHER): Payer: BC Managed Care – PPO | Admitting: Vascular Surgery

## 2019-12-07 ENCOUNTER — Other Ambulatory Visit: Payer: Self-pay

## 2019-12-07 ENCOUNTER — Encounter (INDEPENDENT_AMBULATORY_CARE_PROVIDER_SITE_OTHER): Payer: Self-pay | Admitting: Vascular Surgery

## 2019-12-07 VITALS — BP 125/72 | HR 82 | Resp 16 | Wt 178.8 lb

## 2019-12-07 DIAGNOSIS — E782 Mixed hyperlipidemia: Secondary | ICD-10-CM

## 2019-12-07 DIAGNOSIS — J449 Chronic obstructive pulmonary disease, unspecified: Secondary | ICD-10-CM

## 2019-12-07 DIAGNOSIS — I70213 Atherosclerosis of native arteries of extremities with intermittent claudication, bilateral legs: Secondary | ICD-10-CM

## 2019-12-07 DIAGNOSIS — I10 Essential (primary) hypertension: Secondary | ICD-10-CM

## 2019-12-07 DIAGNOSIS — I739 Peripheral vascular disease, unspecified: Secondary | ICD-10-CM

## 2019-12-07 NOTE — Progress Notes (Signed)
MRN : [id number]  Tina Page is a 64 y.o. (1956-05-18) female who presents with chief complaint of No chief complaint on file. [name] Kitchen  History of Present Illness:  The patient returns to the office for followup and review of the noninvasive studies.   She is s/p bilateral iliac artery stents using the "kissing balloon technique" on 07/10/2016.  The patient notes interval development of left groin pain. This is not related to activity, walking is stable.  No new ulcers or wounds have occurred since the last visit.  There have been no significant changes to the patient's overall health care.  The patient denies amaurosis fugax or recent TIA symptoms. There are no recent neurological changes noted. The patient denies history of DVT, PE or superficial thrombophlebitis. The patient denies recent episodes of angina or shortness of breath.   ABI's Rt=1.08 and Lt=1.04 (previous ABI's Rt=1.03 and Lt=1.09)  No outpatient medications have been marked as taking for the 12/07/19 encounter (Appointment) with Delana Meyer, Dolores Lory, MD.    Past Medical History:  Diagnosis Date  . Anemia   . Atherosclerosis of native arteries of extremity with intermittent claudication ([place]) 07/20/2016  . COPD (chronic obstructive pulmonary disease) ([place])   . 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 Elevated liver function tests 11/03/2017  . GERD (gastroesophageal reflux disease)   . Heme positive stool 01/29/2015  . Hepatitis C 07/12/2016  . HLD (hyperlipidemia)   . Hypertension   . Hypothyroidism   . 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 Thrombocytopenia ([place]) 10/04/2016    Past Surgical History:  Procedure Laterality Date  . ELECTROMAGNETIC NAVIGATION BROCHOSCOPY N/A 12/07/2017   Procedure: ELECTROMAGNETIC NAVIGATION BRONCHOSCOPY;  Surgeon: Flora Lipps, MD;  Location: [facility] ORS;  Service: Cardiopulmonary;  Laterality: N/A;  . ESOPHAGOGASTRODUODENOSCOPY (EGD) WITH  PROPOFOL N/A 07/08/2016   Procedure: ESOPHAGOGASTRODUODENOSCOPY (EGD) WITH PROPOFOL;  Surgeon: Manya Silvas, MD;  Location[facility] ENDOSCOPY;  Service: Endoscopy;  Laterality: N/A;  . KNEE SURGERY Right    repair of acl tear  . PERIPHERAL VASCULAR CATHETERIZATION N/A 07/10/2016   Procedure: Lower Extremity Angiography;  Surgeon: Katha Cabal, MD;  Location: [place] [facility];  Service: Cardiovascular;  Laterality: N/A;  . PERIPHERAL VASCULAR CATHETERIZATION N/A 07/10/2016   Procedure: Abdominal Aortogram w/Lower Extremity;  Surgeon: Katha Cabal, MD;  Location: [place] [facility];  Service: Cardiovascular;  Laterality: N/A;  . PERIPHERAL VASCULAR CATHETERIZATION  07/10/2016   Procedure: Lower Extremity Intervention;  Surgeon: Katha Cabal, MD;  Location: [place] [facility];  Service: Cardiovascular;;    Social History Social History   Tobacco Use  . Smoking status: Former Smoker    Packs/day: 0.50    Types: Cigarettes    Quit date: 07/11/2016    Years since quitting: 3.4  . Smokeless tobacco: Never Used  Substance Use Topics  . Alcohol use: No  . Drug use: No    Family History Family History  Problem Relation Age of Onset  . Diabetes Mother   . Hypertension Mother   . Diabetes Maternal Grandfather   . Hypertension Maternal Grandfather   . Breast cancer Neg Hx     Allergies  Allergen Reactions  . Codeine Anaphylaxis  . Ciprofloxacin Swelling    Facial swelling following single oral dose.      REVIEW OF SYSTEMS (Negative unless checked)  Constitutional: [] Weight loss  [] Fever  [] Chills Cardiac: [] Chest pain   [] Chest pressure   []   Palpitations   [] Shortness of breath when laying flat   [] Shortness of breath with exertion. Vascular:  [] Pain in legs with walking   [] Pain in legs at rest  [] History of DVT   [] Phlebitis   [] Swelling in legs   [] Varicose veins   [] Non-healing ulcers Pulmonary:   [] Uses home oxygen   [] Productive cough   [] Hemoptysis    [] Wheeze  [] COPD   [] Asthma Neurologic:  [] Dizziness   [] Seizures   [] History of stroke   [] History of TIA  [] Aphasia   [] Vissual changes   [] Weakness or numbness in arm   [] Weakness or numbness in leg Musculoskeletal:   [] Joint swelling   [] Joint pain   [] Low back pain Hematologic:  [] Easy bruising  [] Easy bleeding   [] Hypercoagulable state   [] Anemic Gastrointestinal:  [] Diarrhea   [] Vomiting  [] Gastroesophageal reflux/heartburn   [] Difficulty swallowing. Genitourinary:  [] Chronic kidney disease   [] Difficult urination  [] Frequent urination   [] Blood in urine Skin:  [] Rashes   [] Ulcers  Psychological:  [] History of anxiety   []  History of major depression.  Physical Examination  There were no vitals filed for this visit. There is no height or weight on file to calculate BMI. Gen: WD/WN, NAD Head: [place]/AT, No temporalis wasting.  Ear/Nose/Throat: Hearing grossly intact, nares w/o erythema or drainage Eyes: PER, EOMI, sclera nonicteric.  Neck: Supple, no large masses.   Pulmonary:  Good air movement, no audible wheezing bilaterally, no use of accessory muscles.  Cardiac: RRR, no JVD Vascular:  Vessel Right Left  Radial Palpable Palpable  Gastrointestinal: Non-distended. No guarding/no peritoneal signs.  Musculoskeletal: M/S 5/5 throughout.  No deformity or atrophy.  Neurologic: CN 2-12 intact. Symmetrical.  Speech is fluent. Motor exam as listed above. Psychiatric: Judgment intact, Mood & affect appropriate for pt's clinical situation. Dermatologic: No rashes or ulcers noted.  No changes consistent with cellulitis. Lymph : No lichenification or skin changes of chronic lymphedema.  CBC Lab Results  Component Value Date   WBC 5.1 10/20/2019   HGB 12.5 10/20/2019   HCT 39.1 10/20/2019   MCV 98.0 10/20/2019   PLT 127 (L) 10/20/2019    BMET    Component Value Date/Time   NA 137 10/20/2019 1319   NA 142 06/11/2014 1732   K 4.1 10/20/2019 1319   K 3.1 (L) 06/11/2014 1732   CL  102 10/20/2019 1319   CL 103 06/11/2014 1732   CO2 30 10/20/2019 1319   CO2 32 06/11/2014 1732   GLUCOSE 88 10/20/2019 1319   GLUCOSE 100 (H) 06/11/2014 1732   BUN 34 (H) 10/20/2019 1319   BUN 15 06/11/2014 1732   CREATININE 1.30 (H) 10/20/2019 1319   CREATININE 0.73 06/11/2014 1732   CALCIUM 9.4 10/20/2019 1319   CALCIUM 8.9 06/11/2014 1732   GFRNONAA 44 (L) 10/20/2019 1319   GFRNONAA >60 06/11/2014 1732   GFRAA 51 (L) 10/20/2019 1319   GFRAA >60 06/11/2014 1732   CrCl cannot be calculated (Patient's most recent lab result is older than the maximum 21 days allowed.).  COAG Lab Results  Component Value Date   INR 1.09 07/21/2016    Radiology MM 3D SCREEN BREAST BILATERAL  Result Date: 11/22/2019 CLINICAL DATA:  Screening. EXAM: DIGITAL SCREENING BILATERAL MAMMOGRAM WITH TOMO AND CAD COMPARISON:  Previous exam(s). ACR Breast Density Category b: There are scattered areas of fibroglandular density. FINDINGS: There are no findings suspicious for malignancy. Images were processed with CAD. IMPRESSION: No mammographic evidence of malignancy. A result letter of  this screening mammogram will be mailed directly to the patient. RECOMMENDATION: Screening mammogram in one year. (Code:SM-B-01Y) BI-RADS CATEGORY  1: Negative. Electronically Signed   By: Curlene Dolphin M.D.   On: 11/22/2019 16:06     Assessment/Plan 1. Atherosclerosis of native artery of both lower extremities with intermittent claudication (HCC)  Recommend:  The patient has evidence of atherosclerosis of the lower extremities with claudication.  The patient does not voice lifestyle limiting changes at this point in time.  Noninvasive studies do not suggest clinically significant change.  No invasive studies, angiography or surgery at this time The patient should continue walking and begin a more formal exercise program.  The patient should continue antiplatelet therapy and aggressive treatment of the lipid  abnormalities  No changes in the patient's medications at this time  - VAS US AORTA/IVC/ILIACS; Future  2. Essential hypertension Continue antihypertensive medications as already ordered, these medications have been reviewed and there are no changes at this time.   3. Chronic obstructive pulmonary disease, unspecified COPD type ([place]) Continue pulmonary medications and aerosols as already ordered, these medications have been reviewed and there are no changes at this time.    4. Mixed hyperlipidemia Continue statin as ordered and reviewed, no changes at this time     Hortencia Pilar, MD  12/07/2019 9:22 AM

## 2019-12-14 ENCOUNTER — Encounter: Admit: 2019-12-14 | Discharge: 2019-12-14 | Payer: PRIVATE HEALTH INSURANCE

## 2019-12-14 ENCOUNTER — Encounter
Admit: 2019-12-14 | Discharge: 2019-12-14 | Payer: PRIVATE HEALTH INSURANCE | Attending: Hematology & Oncology | Primary: Hematology & Oncology

## 2019-12-14 DIAGNOSIS — D591 AIHA (autoimmune hemolytic anemia): Principal | ICD-10-CM

## 2019-12-26 NOTE — Progress Notes (Signed)
[facility]  [address] [place], [place] [address] Phone: [phone]  Fax: [phone]   Clinic Day:  12/28/2019  Referring physician: Frazier Richards, MD  Chief Complaint: Tina Page is a 64 y.o. female with a right middle lobe mass, warm autoimmune hemolytic anemia, and renal insufficiency who is seen for a 2 month assessment and review of interval chest CT.   HPI: The patient was last seen in the hematology clinic on 10/23/2019 via telemedicine. At that time, she was fatigued. She denied any bruising or bleeding. She denied any respiratory symptoms. Hematocrit was 39.1, hemoglobin 12.5, platelets 127,000, WBC 5,100. Creatinine was 1.30. Folate was >100.0. LDH was 158.  Surveillance continued.   Bilateral breast mammogram on 11/22/2019 revealed no evidence of malignancy.   Patient had a follow up with Dr. Delana Meyer on 12/07/2019. Patient noted interval development of left groin pain. Walking was stable. Patient denied any new ulcers or wounds prior to visit. Patient had no changes made to her medication regimen.   She saw Dr. Floreen Comber Key in hematology at [facility] on 12/14/2019.  She was noted to have compensated hemolysis (haptoglobin undetectable, retic elevated, LDH normal).  She was doing well. She was not sleeping well. She denied any shortness of breath, lightheadedness or dizziness. She had recently stopped entecavir. Patient will follow up as needed.  Chest CT on 12/26/2019 revealed stable sub-cm bilateral pulmonary nodules, consistent with benign etiology. There was stable paramedian consolidation in the right middle lobe and lingula. There was a new airspace disease in inferior right middle lobe, with a few adjacent subcentimeter nodules. Overall, above findings were consistent with waxing and waning atypical infectious process, such as MAI.  During the interim, the patient felt "ok". Patient reports getting the Moderna COVID-19 vaccine on  11/10/2019 and 12/08/2019. The only symptom she had was a bruised left arm. Her energy level was about the same. She is still able to do all the things she would like. Patient reports working 40 hours a week. She has no chest pain, shortness of breath or chest tightness. She has had no changes in urine. LUQ abdominal pain and diarrhea after eating have resolved. She continues to feel lightheaded when she bends over which she thinks could be related to her BP medication.    Past Medical History:  Diagnosis Date  . Anemia   . Atherosclerosis of native arteries of extremity with intermittent claudication ([place]) 07/20/2016  . COPD (chronic obstructive pulmonary disease) ([place])   . 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 Elevated liver function tests 11/03/2017  . GERD (gastroesophageal reflux disease)   . Heme positive stool 01/29/2015  . Hepatitis C 07/12/2016  . HLD (hyperlipidemia)   . Hypertension   . Hypothyroidism   . 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 Thrombocytopenia ([place]) 10/04/2016    Past Surgical History:  Procedure Laterality Date  . ELECTROMAGNETIC NAVIGATION BROCHOSCOPY N/A 12/07/2017   Procedure: ELECTROMAGNETIC NAVIGATION BRONCHOSCOPY;  Surgeon: Flora Lipps, MD;  Location: [facility] ORS;  Service: Cardiopulmonary;  Laterality: N/A;  . ESOPHAGOGASTRODUODENOSCOPY (EGD) WITH PROPOFOL N/A 07/08/2016   Procedure: ESOPHAGOGASTRODUODENOSCOPY (EGD) WITH PROPOFOL;  Surgeon: Manya Silvas, MD;  Location: [facility] ENDOSCOPY;  Service: Endoscopy;  Laterality: N/A;  . KNEE SURGERY Right    repair of acl tear  . PERIPHERAL VASCULAR CATHETERIZATION N/A 07/10/2016   Procedure: Lower Extremity Angiography;  Surgeon: Katha Cabal, MD;  Location: [facility] INVASIVE CV  LAB;  Service: Cardiovascular;  Laterality: N/A;  . PERIPHERAL VASCULAR CATHETERIZATION N/A 07/10/2016   Procedure: Abdominal Aortogram w/Lower Extremity;  Surgeon: Katha Cabal, MD;  Location:  [facility] CV LAB;  Service: Cardiovascular;  Laterality: N/A;  . PERIPHERAL VASCULAR CATHETERIZATION  07/10/2016   Procedure: Lower Extremity Intervention;  Surgeon: Katha Cabal, MD;  Location: [facility] CV LAB;  Service: Cardiovascular;;    Family History  Problem Relation Age of Onset  . Diabetes Mother   . Hypertension Mother   . Diabetes Maternal Grandfather   . Hypertension Maternal Grandfather   . Breast cancer Neg Hx     Social History:  reports that she quit smoking about 3 years ago. Her smoking use included cigarettes. She smoked 0.50 packs per day. She has never used smokeless tobacco. She reports that she does not drink alcohol or use drugs. She has a 21 pack year smoking history (1/2 pack/day from age [age]). The patient works at [employer] She is exposed to cold temperatures. She works 3rd shift. She has a daughter, [name] and a daughter named [name]. She lives in [place]. The patient is alone today.  Allergies:  Allergies  Allergen Reactions  . Codeine Anaphylaxis  . Ciprofloxacin Swelling    Facial swelling following single oral dose.     Current Medications: Current Outpatient Medications  Medication Sig Dispense Refill  . albuterol (PROVENTIL HFA;VENTOLIN HFA) 108 (90 Base) MCG/ACT inhaler Inhale 2 puffs into the lungs every 6 (six) hours as needed for wheezing or shortness of breath.     . allopurinol (ZYLOPRIM) 100 MG tablet TAKE 1 TABLET BY MOUTH EVERY DAY 30 tablet 5  . clopidogrel (PLAVIX) 75 MG tablet Take 1 tablet (75 mg total) by mouth daily. 30 tablet 5  . cyanocobalamin 500 MCG tablet Take 500 mcg by mouth daily.    . folic acid (FOLVITE) 1 MG tablet TAKE 1 TABLET (1 MG TOTAL) BY MOUTH DAILY. 90 tablet 1  . hydrochlorothiazide (HYDRODIURIL) 25 MG tablet Take 25 mg by mouth daily.    [name] Kitchen levothyroxine (SYNTHROID, LEVOTHROID) 75 MCG tablet Take 75 mcg by mouth daily before breakfast.     . lisinopril (PRINIVIL,ZESTRIL)  20 MG tablet Take 20 mg by mouth daily.     [name] Kitchen omeprazole (PRILOSEC) 20 MG capsule Take 1 capsule (20 mg total) by mouth daily. 60 capsule 0  . potassium chloride (KLOR-CON) 20 MEQ packet Take 20 mEq by mouth daily.    . pravastatin (PRAVACHOL) 40 MG tablet Take 40 mg by mouth every evening.     No current facility-administered medications for this visit.    Review of Systems  Constitutional: Positive for malaise/fatigue (chronic) and weight loss (2 lbs). Negative for chills and fever.       Feels "ok".  HENT: Negative.  Negative for congestion, ear pain, hearing loss, nosebleeds and sore throat.   Eyes: Negative.  Negative for blurred vision and double vision.  Respiratory: Negative.  Negative for cough, hemoptysis, sputum production, shortness of breath and stridor.   Cardiovascular: Negative.  Negative for chest pain, palpitations and leg swelling.  Gastrointestinal: Negative.  Negative for abdominal pain (LUQ, comes and goes; resolved), constipation, diarrhea (depends on what she eats; resolved), heartburn, nausea and vomiting.  Genitourinary: Negative.  Negative for dysuria, frequency and urgency.  Musculoskeletal: Negative.  Negative for back pain, joint pain and myalgias.  Skin: Negative.  Negative for rash.  Neurological: Negative.  Negative for dizziness, tingling, sensory change,  focal weakness, weakness and headaches.       Lightheaded when bending over.  Endo/Heme/Allergies: Bruises/bleeds easily (left arm bruise s/p moderna vaccine on 12/08/2019).  Psychiatric/Behavioral: Negative.  Negative for depression and memory loss. The patient is not nervous/anxious and does not have insomnia.   All other systems reviewed and are negative.   Performance status (ECOG): 1  Physical Exam  Constitutional: She is oriented to person, place, and time. She appears well-developed and well-nourished. No distress.  HENT:  Head: Normocephalic and atraumatic.  Mouth/Throat: Oropharynx is clear  and moist. No oropharyngeal exudate.  Gray hair pulled back. Mask.  Eyes: Pupils are equal, round, and reactive to light. Conjunctivae and EOM are normal. No scleral icterus.  Glasses.  Blue eyes.  Cardiovascular: Normal rate, regular rhythm and normal heart sounds.  No murmur heard. Pulmonary/Chest: Effort normal and breath sounds normal. No respiratory distress. She has no wheezes. She has no rales. She exhibits no tenderness.  Abdominal: Soft. Bowel sounds are normal. She exhibits no distension and no mass. There is no abdominal tenderness. There is no rebound and no guarding.  Musculoskeletal:        General: No tenderness or edema. Normal range of motion.     Cervical back: Normal range of motion and neck supple.  Lymphadenopathy:    She has no cervical adenopathy.    She has no axillary adenopathy.       Right: No supraclavicular adenopathy present.       Left: No supraclavicular adenopathy present.  Neurological: She is alert and oriented to person, place, and time.  Skin: Skin is warm and dry. Bruising (left arm s/p moderna vaccine) noted. She is not diaphoretic.  Psychiatric: She has a normal mood and affect. Her behavior is normal. Judgment and thought content normal.  Nursing note and vitals reviewed.   Appointment on 12/28/2019  Component Date Value Ref Range Status  . Sodium 12/28/2019 141  135 - 145 mmol/L Final  . Potassium 12/28/2019 3.9  3.5 - 5.1 mmol/L Final  . Chloride 12/28/2019 104  98 - 111 mmol/L Final  . CO2 12/28/2019 28  22 - 32 mmol/L Final  . Glucose, Bld 12/28/2019 84  70 - 99 mg/dL Final   Glucose reference range applies only to samples taken after fasting for at least 8 hours.  . BUN 12/28/2019 29* 8 - 23 mg/dL Final  . Creatinine, Ser 12/28/2019 1.18* 0.44 - 1.00 mg/dL Final  . Calcium 12/28/2019 9.1  8.9 - 10.3 mg/dL Final  . Total Protein 12/28/2019 7.3  6.5 - 8.1 g/dL Final  . Albumin 12/28/2019 4.2  3.5 - 5.0 g/dL Final  . AST 12/28/2019 20  15  - 41 U/L Final  . ALT 12/28/2019 19  0 - 44 U/L Final  . Alkaline Phosphatase 12/28/2019 81  38 - 126 U/L Final  . Total Bilirubin 12/28/2019 0.7  0.3 - 1.2 mg/dL Final  . GFR calc non Af Amer 12/28/2019 50* >60 mL/min Final  . GFR calc Af Amer 12/28/2019 58* >60 mL/min Final  . Anion gap 12/28/2019 9  5 - 15 Final   Performed at [facility], [address] [place], [place] [address]  . WBC 12/28/2019 4.4  4.0 - 10.5 K/uL Final  . RBC 12/28/2019 3.60* 3.87 - 5.11 MIL/uL Final  . Hemoglobin 12/28/2019 11.4* 12.0 - 15.0 g/dL Final  . HCT 12/28/2019 35.4* 36.0 - 46.0 % Final  . MCV 12/28/2019 98.3  80.0 -  100.0 fL Final  . MCH 12/28/2019 31.7  26.0 - 34.0 pg Final  . MCHC 12/28/2019 32.2  30.0 - 36.0 g/dL Final  . RDW 12/28/2019 13.4  11.5 - 15.5 % Final  . Platelets 12/28/2019 132* 150 - 400 K/uL Final  . nRBC 12/28/2019 0.0  0.0 - 0.2 % Final  . Neutrophils Relative % 12/28/2019 65  % Final  . Neutro Abs 12/28/2019 2.9  1.7 - 7.7 K/uL Final  . Lymphocytes Relative 12/28/2019 22  % Final  . Lymphs Abs 12/28/2019 1.0  0.7 - 4.0 K/uL Final  . Monocytes Relative 12/28/2019 9  % Final  . Monocytes Absolute 12/28/2019 0.4  0.1 - 1.0 K/uL Final  . Eosinophils Relative 12/28/2019 2  % Final  . Eosinophils Absolute 12/28/2019 0.1  0.0 - 0.5 K/uL Final  . Basophils Relative 12/28/2019 1  % Final  . Basophils Absolute 12/28/2019 0.0  0.0 - 0.1 K/uL Final  . Immature Granulocytes 12/28/2019 1  % Final  . Abs Immature Granulocytes 12/28/2019 0.02  0.00 - 0.07 K/uL Final   Performed at [facility], [address], [place], [place] [address]  . LDH 12/28/2019 174  98 - 192 U/L Final   Performed at [facility], [address], [place], [place] [address]    Assessment:  KAMBRYA TOAL is a 64 y.o. 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 One week prior to presentation, she felt like she was coming down  with something. She denied any fever, runny nose, sore throat or cough. She had some diarrhea. She denied any new medications or herbal products.  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 Reticulocyte count was 11.3%. Ferritin was 562. Iron studies revealed a saturation of 20% and a TIBC of 214 (low). B12 was 231 (low normal). Folate was 42. Peripheral smear revealed rouleaux formation. Labs on 06/21/2019revealed normal flow cytometry, SPEP, and immunoglobulins.  Additional testingincluded the following+ studies: hepatitis C antibody,hepatitis B core antibody,CMV IgM, and EBV VCA (IgM and IgG). Hepatitis B by PCRwas negative. Hepatitis C RNAwas negative. Mycoplasma pneumonia IgM was negative. Reticulocyte count was 11.3% (high) indicating appropriate marrow response. C3 and C4 were normal. Cold agglutinin titer was negative x 2. Negative studies included: ANA, hepatitis B surface antigen, SPEP, and free light chain ratio. 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 MMA was initially 330 (normal). Repeat MMA was elevated on 08/01/2016 confirming B12 deficiency. Hepatitis B surface antibody was positive and hepatitis B surface antigen was negative on 08/06/2016.  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 There was no adenopathy. Spleen was normal. Abdominal ultrasoundon 04/15/2017 revealed a normal spleen and sludge in the gallbladder. The liver was echogenic consistent fatty infiltration and/or hepatocellular disease.  She underwent PTCA and stent placementin right and left common iliac arteries and left external iliac artery on 07/10/2016.She is on Plavix.EGD on 03/19/2015 revealed gastritis in the body and antrum.  Colonoscopyon 03/19/2015 revealed one hyperplastic polyp. EGDon 07/08/2016 was normal. No evidence of bleeding.  She has received6 units of warmed PRBCsto date (last on 08/01/2016).If Rituxan is needed, she requires entecavir 0.5 mg q day beginning 2 weeks prior to Rituxan. In addition, hepatitis B viral level and LFTs should be checked monthly.  She began steroids(1  mg/kg) on 08/01/2016. She stopped prednisoneon 03/12/2017. She is onfolic acid1 mg a day.   She hasB12 deficiency. B12 was 231 on 07/09/2016. She is on oral B12. B12 and folate were normal on 03/09/2017 and 10/12/2017.  She developed peri-oral and intranasalherpes simplex-1. She was treated with valacyclovir and doxycycline on 10/19/2016. She developed a transient elevated alkaline phosphataseon 10/19/2016 which subsequently normalized.  She was documented to have awarm autoantibodyon 10/12/2017. LDH and bilirubin were normal. Hematocrit had improved.   She developedflu like symptomson 10/26/2017. Symptoms included cough, myalgias, and fever (tmax unknown). She was prescribed Mucinex, Tamiflu, and amoxicillin. She only took amoxicillin x 5 days. She developedincreased liver function testson 11/02/2017. CMV IgG was positive. EBV VCA IgG, NA IgG, early antigen antibody IgG were elevated. EBV VCA IgM was <36. Testing was c/w a convalescence/past infection or reactivated infection. LFTs normalized on 11/15/2017. Smooth muscle antibodywas 39 (high) on 11/10/2017 and 34 (high) on 11/30/2017.   Abdomen and pelvic CTon 11/10/2017 revealed a 2.2 cm spiculated density in right middle lobe concerning for malignancy. Chest CTon 11/16/2017 revealed a 2.2 x 2.0 x 2.4 spiculated RIGHT middle lobe mass. There was tiny nonspecific upper lobe pulmonary nodules greater on RIGHT, largest 3 mm, of uncertain etiology. There was additional 8 x 8 mm opacity in the posterior sulcus of the RIGHT lower  lobe.  PET scanon 11/22/2017 revealed a 2 cm hypermetabolic right middle lobe lung mass (SUV 3.4) consistent with primary lung neoplasm. There were no findings for mediastinal/hilar lymphadenopathy or metastatic disease. There were areas of hypermetabolism involving the left oblique abdominal muscles and the anorectal junction.  PFTs on 11/18/2017 revealed an FEV1 of 1.22 liters (51%). DLCO adj was 6.8 mL/mmHg/min (32%).  ENB done on 12/07/2017. Cytology was negative for malignancy. Pathologydemonstrated organizing pneumonia and chronic bronchitis. Pathologist commented that organizing pneumonia spanned about 2 mm in one fragment. Cases of focal organizing pneumonia with hypermetabolism on FDG-PET have been described, but changes of this type can also be adjacent to a neoplasm. Patient prescribed a daily dose of Prednisone 10 mg, with re-imaging planned in 6-8 weeks.   Chest CT on 02/06/2018 revealed the spiculated 2.2 cm right middle lobe lesion was almost completely resolved with only a residual 4 mm irregular nodule identified at this location on a background of architectural distortion/evolving scar. There was a 5 mm right parahilar nodule stable since 11/16/2017(not present on 07/07/2016). There were tiny nodules in both upper lobes suggesting inhalation etiology.  Chest CT on 06/27/2019 revealed the nodule of the right middle lobe had almost completely resolved, with residual linear scarring. There was persistent paramedian consolidation of the medial right middle lobe and lingula, generally in keeping with atypical infection, particularly atypical mycobacterium. There had been interval increase in size of a small nodule of the right middle lobe, now measuring 4 mm (previously 2 mm).   There was new 6 mm ground-glass opacity of the lateral left upper lobe measuring.  Although nonspecific, these were likely infectious or inflammatory in nature.  Attention on follow-up was receommended.  There was unchanged tiny biapical pulmonary nodules, benign sequelae of nonspecific infection or inflammation, possibly related cigarette smoking, atypical infection, or other inhalational, inflammatory lung disease. There was hepatic steatosis.  Chest CT on 12/26/2019 revealed stable sub-cm bilateral pulmonary nodules, consistent with benign etiology. There was stable paramedian consolidation in the right middle lobe and lingula. There was a new airspace disease in inferior right middle lobe, with a few adjacent subcentimeter nodules. Overall,  above findings were consistent with waxing and waning atypical infectious process, such as MAI.  She began prednisone10 mg on 12/07/2017 (discontinued 03/2018).She received Rituxanweekly x 4 (last dose 06/16/2018).  Entecavir was discontinued on 07/06/2019.  She has diarrhea. 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 She received azithromycin x 3 days. She received ciprofloxacin x 10 days without improvement. She has seen ID in [place]. She began Bactrim on 03/04/2018 (discontinued on 03/11/2018) secondary to increase in creatine.  She has renal insufficiency. Creatininehas been followed: 1.17 on 11/05/2017, 1.85 on 11/09/2017, 1.38 on 11/12/2017, 1.74 on 11/15/2017, 1.66 on 11/17/2017, and 1.48 on 11/23/2017. Urinalysis on 11/15/2017 revealed revealed no hemoglobin, bilirubin or active sediment.  Liver function tests increased on 02/15/2019.  Work-up on 02/16/2019 revealed hepatitis B E antibody positive.  Negative studies included: hepatitis A total antibody, hepatitis A IgM, hepatitis B surface antigen, hepatitis B E antigen.  Hepatitis B DNA was not detected.  Folate was 80.5 and vitamin B12 was 443.  She has received her vaccinations:  She has completed the PCV13, PPSV23, and HiB.  She received Menvio (quadrivalent meningoccal vaccine) and Bexero (univalent serogroup B meningoccal vaccine) on  07/06/2019.  She notes that she tested positive for COVID-19 on 08/21/2019.  She received the Moderna COVID-19 vaccine on 11/10/2019 and 12/08/2019.   Symptomatically, she is doing well.  She denies any B symptoms.  Exam reveals no adenopathy or hepatosplenomegaly.  Plan: 1.   Labs today: CBC with diff, CMP, LDH, B12, folate. 2.Hemolytic anemia Clinically, she is doing well without evidence of hemolysis.       Hematocrit35.4. Hemoglobin 11.4.LDH is 174 (normal). No intervention needed. 3.Hepatitis B Patient was previously exposed to hepatitis B. She was on entecavir x 1 year (stopped 07/06/2019). Patient did not complete her meningococcal vaccines.  LFTs are normal.  Continue to monitor 4.B12 deficiency B12 was 443 on 02/15/2019 and 846 on 06/19/2019.  Folate was 80.5 on 02/15/2019 and > 100 on 10/20/2019.   Folate was decreased to 3 times a week.  B12 is 752 and folate 34 on 12/28/2019.             Check B12 and folate levels annually. 6. Right middle lobe nodule Chest CT on 06/27/2019 was overall improved.   RML nodule had increased from 2 to 4 mm (unclear significance).   LUL 6 mm ground glass opacity (new).  Chest CT on 12/26/2019 was personally reviewed.  Agree with radiology interpretation.   There are stable sub-cm bilateral pulmonary nodules, consistent with benign etiology.    There was a new airspace disease in inferior right middle lobe, with a few adjacent subcentimeter nodules.    Fndings felt c/w waxing and waning atypical infectious process, such as MAI.  Consult pulmonary medicine (Dr Mortimer Fries). 7.   RTC in 6 months for MD assessment and labs (CBC with diff, CMP, LDH, B12, folate).   I discussed the assessment and treatment plan with the patient.  The patient was provided an opportunity to ask questions and all were answered.  The patient agreed with the plan and demonstrated an understanding of the  instructions.  The patient was advised to call back or seek an in person evaluation if the symptoms worsen or if the condition fails to improve as anticipated.   Nolon Stalls, MD, PhD  12/28/2019, 2:07 PM  I, Selena Batten, am acting as scribe for [employer]. Mike Gip, MD, PhD.  I, [place] Mil[place] C. Mike Gip, MD, have reviewed the  above documentation for accuracy and completeness, and I agree with the above.

## 2019-12-27 ENCOUNTER — Other Ambulatory Visit: Payer: Self-pay

## 2019-12-27 ENCOUNTER — Ambulatory Visit
Admission: RE | Admit: 2019-12-27 | Discharge: 2019-12-27 | Disposition: A | Discharge status: Home / Self Care | Payer: BC Managed Care – PPO | Source: Ambulatory Visit | Attending: Hematology and Oncology | Admitting: Hematology and Oncology

## 2019-12-27 DIAGNOSIS — R911 Solitary pulmonary nodule: Secondary | ICD-10-CM | POA: Diagnosis not present

## 2019-12-27 NOTE — Progress Notes (Signed)
Patient confirmed name and DOB for phone assessment. She states that she would like to discuss MRI results.

## 2019-12-28 ENCOUNTER — Inpatient Hospital Stay: Payer: BC Managed Care – PPO | Attending: Hematology and Oncology | Admitting: Hematology and Oncology

## 2019-12-28 ENCOUNTER — Inpatient Hospital Stay: Payer: BC Managed Care – PPO

## 2019-12-28 ENCOUNTER — Encounter: Payer: Self-pay | Admitting: Hematology and Oncology

## 2019-12-28 VITALS — BP 122/51 | HR 88 | Temp 96.6°F | Resp 18 | Ht 63.0 in | Wt 178.2 lb

## 2019-12-28 DIAGNOSIS — D5911 Warm autoimmune hemolytic anemia: Secondary | ICD-10-CM | POA: Insufficient documentation

## 2019-12-28 DIAGNOSIS — K219 Gastro-esophageal reflux disease without esophagitis: Secondary | ICD-10-CM | POA: Insufficient documentation

## 2019-12-28 DIAGNOSIS — E039 Hypothyroidism, unspecified: Secondary | ICD-10-CM | POA: Diagnosis not present

## 2019-12-28 DIAGNOSIS — I10 Essential (primary) hypertension: Secondary | ICD-10-CM | POA: Insufficient documentation

## 2019-12-28 DIAGNOSIS — Z7902 Long term (current) use of antithrombotics/antiplatelets: Secondary | ICD-10-CM | POA: Diagnosis not present

## 2019-12-28 DIAGNOSIS — B1910 Unspecified viral hepatitis B without hepatic coma: Secondary | ICD-10-CM | POA: Insufficient documentation

## 2019-12-28 DIAGNOSIS — J449 Chronic obstructive pulmonary disease, unspecified: Secondary | ICD-10-CM | POA: Insufficient documentation

## 2019-12-28 DIAGNOSIS — E785 Hyperlipidemia, unspecified: Secondary | ICD-10-CM | POA: Diagnosis not present

## 2019-12-28 DIAGNOSIS — E538 Deficiency of other specified B group vitamins: Secondary | ICD-10-CM | POA: Insufficient documentation

## 2019-12-28 DIAGNOSIS — D5912 Cold autoimmune hemolytic anemia: Secondary | ICD-10-CM

## 2019-12-28 DIAGNOSIS — Z87891 Personal history of nicotine dependence: Secondary | ICD-10-CM | POA: Diagnosis not present

## 2019-12-28 DIAGNOSIS — Z79899 Other long term (current) drug therapy: Secondary | ICD-10-CM | POA: Insufficient documentation

## 2019-12-28 DIAGNOSIS — D591 Autoimmune hemolytic anemia, unspecified: Secondary | ICD-10-CM

## 2019-12-28 DIAGNOSIS — Z8249 Family history of ischemic heart disease and other diseases of the circulatory system: Secondary | ICD-10-CM | POA: Insufficient documentation

## 2019-12-28 DIAGNOSIS — R918 Other nonspecific abnormal finding of lung field: Secondary | ICD-10-CM | POA: Insufficient documentation

## 2019-12-28 DIAGNOSIS — R768 Other specified abnormal immunological findings in serum: Secondary | ICD-10-CM | POA: Diagnosis not present

## 2019-12-28 DIAGNOSIS — Z833 Family history of diabetes mellitus: Secondary | ICD-10-CM | POA: Insufficient documentation

## 2019-12-28 LAB — COMPREHENSIVE METABOLIC PANEL
ALT: 19 U/L (ref 0–44)
AST: 20 U/L (ref 15–41)
Albumin: 4.2 g/dL (ref 3.5–5.0)
Alkaline Phosphatase: 81 U/L (ref 38–126)
Anion gap: 9 (ref 5–15)
BUN: 29 mg/dL — ABNORMAL HIGH (ref 8–23)
CO2: 28 mmol/L (ref 22–32)
Calcium: 9.1 mg/dL (ref 8.9–10.3)
Chloride: 104 mmol/L (ref 98–111)
Creatinine, Ser: 1.18 mg/dL — ABNORMAL HIGH (ref 0.44–1.00)
GFR calc Af Amer: 58 mL/min — ABNORMAL LOW (ref >60)
GFR calc non Af Amer: 50 mL/min — ABNORMAL LOW (ref >60)
Glucose, Bld: 84 mg/dL (ref 70–99)
Potassium: 3.9 mmol/L (ref 3.5–5.1)
Sodium: 141 mmol/L (ref 135–145)
Total Bilirubin: 0.7 mg/dL (ref 0.3–1.2)
Total Protein: 7.3 g/dL (ref 6.5–8.1)

## 2019-12-28 LAB — CBC WITH DIFFERENTIAL/PLATELET
Abs Immature Granulocytes: 0.02 10*3/uL (ref 0.00–0.07)
Basophils Absolute: 0 10*3/uL (ref 0.0–0.1)
Basophils Relative: 1 %
Eosinophils Absolute: 0.1 10*3/uL (ref 0.0–0.5)
Eosinophils Relative: 2 %
HCT: 35.4 % — ABNORMAL LOW (ref 36.0–46.0)
Hemoglobin: 11.4 g/dL — ABNORMAL LOW (ref 12.0–15.0)
Immature Granulocytes: 1 %
Lymphocytes Relative: 22 %
Lymphs Abs: 1 10*3/uL (ref 0.7–4.0)
MCH: 31.7 pg (ref 26.0–34.0)
MCHC: 32.2 g/dL (ref 30.0–36.0)
MCV: 98.3 fL (ref 80.0–100.0)
Monocytes Absolute: 0.4 10*3/uL (ref 0.1–1.0)
Monocytes Relative: 9 %
Neutro Abs: 2.9 10*3/uL (ref 1.7–7.7)
Neutrophils Relative %: 65 %
Platelets: 132 10*3/uL — ABNORMAL LOW (ref 150–400)
RBC: 3.6 MIL/uL — ABNORMAL LOW (ref 3.87–5.11)
RDW: 13.4 % (ref 11.5–15.5)
WBC: 4.4 10*3/uL (ref 4.0–10.5)
nRBC: 0 % (ref 0.0–0.2)

## 2019-12-28 LAB — LACTATE DEHYDROGENASE: LDH: 174 U/L (ref 98–192)

## 2019-12-29 LAB — FOLATE: Folate: 34 ng/mL (ref >5.9)

## 2019-12-29 LAB — VITAMIN B12: Vitamin B-12: 752 pg/mL (ref 180–914)

## 2020-01-09 ENCOUNTER — Telehealth: Payer: Self-pay | Admitting: Internal Medicine

## 2020-01-09 NOTE — Telephone Encounter (Signed)
ATC patient unable to reach.  Dr. Zoila Shutter schedule is done through July. He is not doing office during those months.

## 2020-01-10 NOTE — Telephone Encounter (Signed)
LMTCB x2 for pt 

## 2020-01-11 NOTE — Telephone Encounter (Signed)
Can schedule with APP when calls back  LMTCB x 3  And will close per protocol

## 2020-02-19 ENCOUNTER — Other Ambulatory Visit: Payer: Self-pay

## 2020-02-19 ENCOUNTER — Encounter: Payer: Self-pay | Admitting: Acute Care

## 2020-02-19 ENCOUNTER — Ambulatory Visit: Payer: BC Managed Care – PPO | Admitting: Acute Care

## 2020-02-19 VITALS — BP 124/68 | HR 87 | Temp 97.5°F | Ht 63.0 in | Wt 178.2 lb

## 2020-02-19 DIAGNOSIS — J449 Chronic obstructive pulmonary disease, unspecified: Secondary | ICD-10-CM | POA: Diagnosis not present

## 2020-02-19 DIAGNOSIS — A310 Pulmonary mycobacterial infection: Secondary | ICD-10-CM | POA: Diagnosis not present

## 2020-02-19 DIAGNOSIS — R0600 Dyspnea, unspecified: Secondary | ICD-10-CM | POA: Diagnosis not present

## 2020-02-19 DIAGNOSIS — R0609 Other forms of dyspnea: Secondary | ICD-10-CM

## 2020-02-19 MED ORDER — DOXYCYCLINE HYCLATE 100 MG PO TABS: 100.0000 mg | ORAL_TABLET | Freq: Two times a day (BID) | ORAL | 0 refills | Status: DC

## 2020-02-19 NOTE — Progress Notes (Signed)
History of Present Illness Tina Page is a 64 y.o. female former smoker ( Quit 2017) with COPD, originally referred for RML opacity concerning for malignancy in 2018( But biopsy was negative).  . She is followed by Dr. Mortimer Fries for atypical pneumonias , and surveillance of pulmonary nodules.    HPI:  59 F former smoker (3/4-1 PPD X 40 yrs, quit 2017) with hemolytic anemia followed by Dr Mike Gip who was found to have elevated LFTs in early 10/2017 which prompted a CTAP with findings as above which, int turn, prompted CT chest and PET scans. She is referred for diagnostic evaluation of RML mass. .      02/19/2020 Follow Up CT Chest Pt. Presents for follow up of recent surveillance CT Chest as follow up for pulmonary nodules.. We discussed that there is no evidence of malignancy, but the same opacity and some new airspace disease appears to be a waxing and waning atypical infectious process, such as MAI.She has not had any sputum cultures that I can see in the system. She states she does not have a productive cough, which is most likely the reason why.  She states she has been at her baseline. She states she does have dyspnea with exertion. She states she does get tired walking to the parking lot. She states he has had PFT's in the past. She states she is on albuterol as rescue, but she does not use a maintenance inhaler. She states she feels her dyspnea with exertion has become worse in the last year. She states she has been wheezing with exertion. We discussed that this is a good time to use her rescue inhaler. We will repeat PFT's, and evaluate need for maintenance therapy . She denies any fever, chest pain, orthopnea or hemoptysis.   Test Results: 12/27/2019: CT Chest WO Contrast Stable sub-cm bilateral pulmonary nodules, consistent with benign etiology. 2. Stable paramedian consolidation in the right middle lobe and lingula. 3. New airspace disease in inferior right middle lobe, with a  few adjacent subcentimeter nodules. Overall, above findings are consistent with waxing and waning atypical infectious process, such as MAI.  12/07/2017 Pathology RML Mass DIAGNOSIS:  A. LUNG, RIGHT MIDDLE LOBE; ELECTROMAGNETIC NAVIGATION BRONCHOSCOPY FNA:  - NEGATIVE FOR MALIGNANCY.  - LOW CELLULARITY SAMPLE WITH MACROPHAGES AND BRONCHIAL EPITHELIAL  CELLS.   11/10/17 CTAP: No intra-abdominal findings of note. 2.2 cm spiculated density is noted in right middle lobe concerning for malignancy. CT scan of the chest with intravenous contrast is recommended for further evaluation 11/16/17 CT chest: Spiculated R middle lobe mass 2.2 x 2.0 x 2.4 cm highly suspicious for a primary pulmonary neoplasm. Multiple tiny nonspecific upper lobe pulmonary nodules greater on R, largest 3 mm, of uncertain etiology; these could be postinflammatory but tiny metastatic foci are not excluded 11/22/17 PET: 2 cm right middle lobe lung mass is hypermetabolic and consistent with primary lung neoplasm. 2. No findings for mediastinal/hilar lymphadenopathy or metastatic disease 11/18/17 PFTs: FVC: 1.88 > 2.04 L (63 > 69 %pred),  FEV1: 1.22 > 1.23 L (51 %pred), FEV1/FVC: 65%, TLC: 4.00 L (83 %pred), DLCO 6.8, 32 %pred, DLCO/VA 55% CBC Latest Ref Rng & Units 12/28/2019 10/20/2019 06/19/2019  WBC 4.0 - 10.5 K/uL 4.4 5.1 4.3  Hemoglobin 12.0 - 15.0 g/dL 11.4(L) 12.5 12.9  Hematocrit 36 - 46 % 35.4(L) 39.1 39.3  Platelets 150 - 400 K/uL 132(L) 127(L) 120(L)    BMP Latest Ref Rng & Units 12/28/2019 10/20/2019 06/19/2019  Glucose 70 -  99 mg/dL 84 88 106(H)  BUN 8 - 23 mg/dL 29(H) 34(H) 30(H)  Creatinine 0.44 - 1.00 mg/dL 1.18(H) 1.30(H) 1.15(H)  Sodium 135 - 145 mmol/L 141 137 139  Potassium 3.5 - 5.1 mmol/L 3.9 4.1 4.2  Chloride 98 - 111 mmol/L 104 102 106  CO2 22 - 32 mmol/L 28 30 25   Calcium 8.9 - 10.3 mg/dL 9.1 9.4 9.4    BNP No results found for: BNP  ProBNP No results found for: PROBNP  PFT No results found  for: FEV1PRE, FEV1POST, FVCPRE, FVCPOST, TLC, DLCOUNC, PREFEV1FVCRT, PSTFEV1FVCRT  No results found.   Past medical hx Past Medical History:  Diagnosis Date  . Anemia   . Atherosclerosis of native arteries of extremity with intermittent claudication ([facility]) 07/20/2016  . COPD (chronic obstructive pulmonary disease) ([facility])   . 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 Elevated liver function tests 11/03/2017  . GERD (gastroesophageal reflux disease)   . Heme positive stool 01/29/2015  . Hepatitis C 07/12/2016  . HLD (hyperlipidemia)   . Hypertension   . Hypothyroidism   . 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 Thrombocytopenia ([place]) 10/04/2016     Social History   Tobacco Use  . Smoking status: Former Smoker    Packs/day: 0.50    Types: Cigarettes    Quit date: 07/11/2016    Years since quitting: 3.6  . Smokeless tobacco: Never Used  Vaping Use  . Vaping Use: Never used  Substance Use Topics  . Alcohol use: No  . Drug use: No    [name] reports that she quit smoking about 3 years ago. Her smoking use included cigarettes. She smoked 0.50 packs per day. She has never used smokeless tobacco. She reports that she does not drink alcohol and does not use drugs.  Tobacco Cessation: Former smoker Quit 2017 after cardiac stent placement.  Past surgical hx, Family hx, Social hx all reviewed.  Current Outpatient Medications on File Prior to Visit  Medication Sig  . albuterol (PROVENTIL HFA;VENTOLIN HFA) 108 (90 Base) MCG/ACT inhaler Inhale 2 puffs into the lungs every 6 (six) hours as needed for wheezing or shortness of breath.   . allopurinol (ZYLOPRIM) 100 MG tablet TAKE 1 TABLET BY MOUTH EVERY DAY  . clopidogrel (PLAVIX) 75 MG tablet Take 1 tablet (75 mg total) by mouth daily.  . cyanocobalamin 500 MCG tablet Take 500 mcg by mouth daily.  . folic acid (FOLVITE) 1 MG tablet TAKE 1 TABLET (1 MG TOTAL) BY MOUTH DAILY.  . hydrochlorothiazide  (HYDRODIURIL) 25 MG tablet Take 25 mg by mouth daily.  [name] Kitchen levothyroxine (SYNTHROID, LEVOTHROID) 75 MCG tablet Take 75 mcg by mouth daily before breakfast.   . lisinopril (PRINIVIL,ZESTRIL) 20 MG tablet Take 20 mg by mouth daily.   [name] Kitchen omeprazole (PRILOSEC) 20 MG capsule Take 1 capsule (20 mg total) by mouth daily.  . potassium chloride (KLOR-CON) 20 MEQ packet Take 20 mEq by mouth daily.  . pravastatin (PRAVACHOL) 40 MG tablet Take 40 mg by mouth every evening.   No current facility-administered medications on file prior to visit.     Allergies  Allergen Reactions  . Codeine Anaphylaxis  . Ciprofloxacin Swelling    Facial swelling following single oral dose.     Review Of Systems:  Constitutional:   No  weight loss, night sweats,  Fevers, chills, fatigue, or  lassitude.  HEENT:   No headaches,  Difficulty swallowing,  Tooth/dental problems, or  Sore throat,                No sneezing, itching, ear ache, nasal congestion, post nasal drip,   CV:  No chest pain,  Orthopnea, PND, swelling in lower extremities, anasarca, dizziness, palpitations, syncope.   GI  No heartburn, indigestion, abdominal pain, nausea, vomiting, diarrhea, change in bowel habits, loss of appetite, bloody stools.   Resp: + shortness of breath with exertion less at rest.  No excess mucus, no productive cough,  No non-productive cough,  No coughing up of blood.  No change in color of mucus.  +  Wheezing with exertion.  No chest wall deformity  Skin: no rash or lesions.  GU: no dysuria, change in color of urine, no urgency or frequency.  No flank pain, no hematuria   MS:  No joint pain or swelling.  No decreased range of motion.  No back pain.  Psych:  No change in mood or affect. No depression or anxiety.  No memory loss.   Vital Signs BP 124/68 (BP Location: Left Arm, Cuff Size: Normal)   Pulse 87   Temp (!) 97.5 F (36.4 C) (Temporal)   Ht 5\' 3"  (1.6 m)   Wt 178 lb 3.2 oz (80.8 kg)   SpO2 97%   BMI 31.57  kg/m    Physical Exam:  General- No distress,  A&Ox3, pleasant ENT: No sinus tenderness, TM clear, pale nasal mucosa, no oral exudate,no post nasal drip, no LAN Cardiac: S1, S2, regular rate and rhythm, no murmur Chest: No wheeze/ rales/ dullness; no accessory muscle use, no nasal flaring, no sternal retractions Abd.: Soft Non-tender Ext: No clubbing cyanosis, edema Neuro:  normal strength Skin: No rashes, warm and dry Psych: normal mood and behavior   Assessment/Plan CT Surveillance of RML opacity  Plan Your CT Chest shows some infection. We will treat you with Doxycycline 100 mg twice daily for 7 days Take with food, and wear sun block in the sun while on antibiotic.  Take probiotic with antibiotic Consider Sputum Cx if patient becomes more symptomatic. Or has recurring pneumonia Follow up tele visit with Eric Form, NP in 1-2 weeks to evaluate for improvement  Dyspnea on exertion Worsening over the last 12 months No Maintenance inhaler Plan We will place an order for PFT's  We will call you to schedule. Based on PFT results, we will determine if you may benefit from a maintenance inhaler.  Continue using your rescue inhaler as needed for shortness of breath or wheezing.  Keep this with you at all times Follow up televisit with Judson Roch NP in 1-2 weeks to assess for improvement after antibiotic therapy Follow up with Dr. Mortimer Fries or Judson Roch NP ( Can be tele visit)  after PFT's to evaluate for need for maintenance therapy and continued follow up of lung nodules Please contact office for sooner follow up if symptoms do not improve or worsen or seek emergency care       Magdalen Spatz, NP 02/19/2020  11:13 AM

## 2020-02-19 NOTE — Patient Instructions (Addendum)
It is good to see you today. Your CT Chest shows some infection. We will treat you with Doxycycline 100 mg twice daily for 7 days Take with food, and wear sun block in the sun while on antibiotic.  Take probiotic with antibiotic We will place an order for PFT's  We will call you to schedule. Based on PFT results, we will determine if you may benefit from a maintenance inhaler.  Continue using your rescue inhaler as needed for shortness of breath or wheezing.  Follow up televisit with Judson Roch NP in 1-2 weeks Follow up with Dr. Mortimer Fries after PFT's to evaluate for need for maintenance therapy and continued follow up of lung nodules Consider sputum Culture Please contact office for sooner follow up if symptoms do not improve or worsen or seek emergency care

## 2020-03-06 ENCOUNTER — Encounter: Payer: Self-pay | Admitting: Acute Care

## 2020-03-06 ENCOUNTER — Telehealth (INDEPENDENT_AMBULATORY_CARE_PROVIDER_SITE_OTHER): Payer: BC Managed Care – PPO | Admitting: Acute Care

## 2020-03-06 DIAGNOSIS — R0602 Shortness of breath: Secondary | ICD-10-CM | POA: Diagnosis not present

## 2020-03-06 DIAGNOSIS — A310 Pulmonary mycobacterial infection: Secondary | ICD-10-CM | POA: Diagnosis not present

## 2020-03-06 DIAGNOSIS — J449 Chronic obstructive pulmonary disease, unspecified: Secondary | ICD-10-CM

## 2020-03-06 DIAGNOSIS — R918 Other nonspecific abnormal finding of lung field: Secondary | ICD-10-CM | POA: Diagnosis not present

## 2020-03-06 NOTE — Progress Notes (Signed)
Virtual Visit via Video  Note  I connected with [name] on 03/06/20 at  3:30 PM EDT by telephone and verified that I am speaking with the correct person using two identifiers.  Location: Patient: At home  Provider:  [place], [place], [place], [address]   I discussed the limitations, risks, security and privacy concerns of performing an evaluation and management service by telephone and the availability of in person appointments. I also discussed with the patient that there may be a patient responsible charge related to this service. The patient expressed understanding and agreed to proceed.  Tina Page is a 64 y.o. female former smoker ( Quit 2017) with COPD, originally referred for RML opacity concerning for malignancy in 2018( But biopsy was negative).  . She is followed by Dr. Mortimer Fries for atypical pneumonias , and surveillance of pulmonary nodules.    History of Present Illness: Pt. Was treated for COPD flare with Doxy after an OV 02/19/2020 for a Flare, and CT Chest which showed an opacity and some new airspace disease which appeared to be a waxing and waning atypical infectious process, such as MAI.She had been having more dyspnea on exertion. She states she does feel better after treatment with the Doxycycline. She states her secretions are clear. No fever, minimal rescue inhaler use. She has an occasional cough. Previous PFT's show moderate obstruction, but patient has not been on any maintenance medications. She does use her rescue inhaler as needed. She uses it a few times a week. She does have occasional dyspnea with exertion.   Observations/Objective:  Pt was in NAD, speaking in full sentences. Color was good . No accessory Muscle use.  12/27/2019: CT Chest WO Contrast Stable sub-cm bilateral pulmonary nodules, consistent with benign etiology. 2. Stable paramedian consolidation in the right middle lobe and lingula. 3. New airspace disease in inferior right  middle lobe, with a few adjacent subcentimeter nodules. Overall, above findings are consistent with waxing and waning atypical infectious process, such as MAI.  12/07/2017 Pathology RML Mass DIAGNOSIS:  A. LUNG, RIGHT MIDDLE LOBE; ELECTROMAGNETIC NAVIGATION BRONCHOSCOPY FNA:  - NEGATIVE FOR MALIGNANCY.  - LOW CELLULARITY SAMPLE WITH MACROPHAGES AND BRONCHIAL EPITHELIAL  CELLS.   11/10/17 CTAP:No intra-abdominal findings of note. 2.2 cm spiculated density is noted in right middle lobe concerning for malignancy. CT scan of the chest with intravenous contrast is recommended for further evaluation 11/16/17 CT chest:Spiculated R middle lobe mass 2.2 x 2.0 x 2.4 cm highlysuspicious for a primary pulmonary neoplasm.Multiple tiny nonspecific upper lobe pulmonary nodules greater on R, largest 3 mm, of uncertain etiology; these could be postinflammatory but tiny metastatic foci are not excluded 11/22/17 PET:2 cm right middle lobe lung mass is hypermetabolic and consistent with primary lung neoplasm. 2. No findings for mediastinal/hilar lymphadenopathy or metastatic disease 11/18/17 PFTs:FVC:1.88 > 2.04L (63 > 69%pred), FEV1: 1.22 > 1.23L (51%pred), FEV1/FVC: 65%, TLC:4.00L (83%pred), DLCO 6.8, 32%pred, DLCO/VA 55%  Assessment and Plan: Atypical Pneumonitis  Treated with Doxycycline with some improvement  Plan Will plan on Sputum Culture at follow up 8/16 in [facility] She will have been off antibiotics for 6 weeks at that point Better organism identification for future treatment  COPD Previous PFT's show moderate COPD, with decreased DLCO No  Maintenance inhaler Plan PFT's scheduled 7/13 Follow up 8/16 to evaluate and determine need for maintenance therapy Rescue inhaler as needed for breakthrough shortness of breath or wheezing Please contact office for sooner follow up if symptoms  do not improve or worsen or seek emergency care   Pulmonary Nodules Tobacco  History Plan Follow up CT in 6-12 months ( 05/2020-11/2020)  Follow Up Instructions: 8/16/ 2021 With Judson Roch NP at [facility]    I discussed the assessment and treatment plan with the patient. The patient was provided an opportunity to ask questions and all were answered. The patient agreed with the plan and demonstrated an understanding of the instructions.   The patient was advised to call back or seek an in-person evaluation if the symptoms worsen or if the condition fails to improve as anticipated.  I provided 30 minutes of -face-to-face time during this encounter.   Magdalen Spatz, NP

## 2020-03-06 NOTE — Patient Instructions (Addendum)
It was good to talk with you today I am glad you are feeling better We will Will plan on Sputum Culture at follow up 8/16 in [place] PFT's as ordered and scheduled 7/13 Follow up with Judson Roch NP 8/16 in [place] will call you to schedule a time. Rescue inhaler as needed for breakthrough shortness of breath or wheezing Follow up CT in 6-12 months ( 05/2020-11/2020) Please contact office for sooner follow up if symptoms do not improve or worsen or seek emergency care

## 2020-03-06 NOTE — Addendum Note (Signed)
Addended by: Coralie Keens on: 03/06/2020 04:34 PM   Modules accepted: Orders

## 2020-03-08 ENCOUNTER — Telehealth: Payer: Self-pay

## 2020-03-08 NOTE — Telephone Encounter (Signed)
Pt is aware of date/time of covid test prior to PFT. Pt voiced her understanding and had no further questions.  Nothing further is needed.  

## 2020-03-11 ENCOUNTER — Other Ambulatory Visit
Admission: RE | Admit: 2020-03-11 | Discharge: 2020-03-11 | Disposition: A | Discharge status: Home / Self Care | Payer: BC Managed Care – PPO | Source: Ambulatory Visit | Attending: Acute Care | Admitting: Acute Care

## 2020-03-11 ENCOUNTER — Other Ambulatory Visit: Payer: Self-pay

## 2020-03-11 DIAGNOSIS — Z20822 Contact with and (suspected) exposure to covid-19: Secondary | ICD-10-CM | POA: Diagnosis not present

## 2020-03-11 DIAGNOSIS — Z01812 Encounter for preprocedural laboratory examination: Secondary | ICD-10-CM | POA: Diagnosis not present

## 2020-03-11 LAB — SARS CORONAVIRUS 2 (TAT 6-24 HRS): SARS Coronavirus 2: NEGATIVE

## 2020-03-12 ENCOUNTER — Ambulatory Visit: Payer: BC Managed Care – PPO | Attending: Acute Care

## 2020-03-12 DIAGNOSIS — R918 Other nonspecific abnormal finding of lung field: Secondary | ICD-10-CM | POA: Diagnosis not present

## 2020-03-12 DIAGNOSIS — J449 Chronic obstructive pulmonary disease, unspecified: Secondary | ICD-10-CM | POA: Diagnosis not present

## 2020-03-12 DIAGNOSIS — J984 Other disorders of lung: Secondary | ICD-10-CM | POA: Diagnosis not present

## 2020-04-15 ENCOUNTER — Encounter: Payer: Self-pay | Admitting: Acute Care

## 2020-04-15 ENCOUNTER — Other Ambulatory Visit: Payer: Self-pay

## 2020-04-15 ENCOUNTER — Ambulatory Visit: Payer: BC Managed Care – PPO | Admitting: Acute Care

## 2020-04-15 ENCOUNTER — Other Ambulatory Visit
Admission: RE | Admit: 2020-04-15 | Discharge: 2020-04-15 | Disposition: A | Discharge status: Home / Self Care | Payer: BC Managed Care – PPO | Source: Ambulatory Visit | Attending: Acute Care | Admitting: Acute Care

## 2020-04-15 ENCOUNTER — Telehealth: Payer: Self-pay | Admitting: Acute Care

## 2020-04-15 ENCOUNTER — Telehealth: Payer: Self-pay

## 2020-04-15 VITALS — BP 126/78 | HR 80 | Temp 97.1°F | Ht 63.0 in | Wt 178.8 lb

## 2020-04-15 DIAGNOSIS — A310 Pulmonary mycobacterial infection: Secondary | ICD-10-CM | POA: Diagnosis not present

## 2020-04-15 MED ORDER — STIOLTO RESPIMAT 2.5-2.5 MCG/ACT IN AERS: 2.0000 | INHALATION_SPRAY | Freq: Every day | RESPIRATORY_TRACT | 2 refills | Status: DC

## 2020-04-15 MED ORDER — BEVESPI AEROSPHERE 9-4.8 MCG/ACT IN AERO: 2.0000 | INHALATION_SPRAY | Freq: Two times a day (BID) | RESPIRATORY_TRACT | 11 refills | Status: DC

## 2020-04-15 NOTE — Telephone Encounter (Signed)
Received PA request for Bevespi. Covered alternatives are fluticasone-salmeterol, glycopyrrolate PF, Anoro, Stiolto, Trelegy, combivent, Symbicort or [employer].   Sarah, please advise. Thanks

## 2020-04-15 NOTE — Addendum Note (Signed)
Addended by: Claudette Head A on: 04/15/2020 04:55 PM   Modules accepted: Orders

## 2020-04-15 NOTE — Telephone Encounter (Signed)
We can try the [place]. 2 puffs once daily as a therapeutic Trial. Thanks

## 2020-04-15 NOTE — Addendum Note (Signed)
Addended by: Claudette Head A on: 04/15/2020 04:32 PM   Modules accepted: Orders

## 2020-04-15 NOTE — Telephone Encounter (Signed)
Please see duplicate phone note dated as 04/15/2020

## 2020-04-15 NOTE — Progress Notes (Signed)
History of Present Illness Tina Page is a 64 y.o. female former smoker( Quit 2017)with COPD,originally referred forRML opacity concerning for malignancyin 2018( But biopsy was negative). . She is followed by Dr. Mortimer Fries for atypical pneumonias , and surveillance of pulmonary nodules.     04/15/2020 Pt. Presents for follow up after slow to resolve COPD exacerbation. She was treated with Doxy and prednisone 02/19/2020. She has completed these medications. She states secretions are clear and they are not discolored. She denies any fever, chest pain, orthopnea or hemoptysis.. She had a CT chest which shows the waxing and waning ? Infection, with concern for MAI. We will send sputum for culture.  Pt had PFT's which confirm COPD. She has significant BD response. She is interested in trying a maintenance inhaler. We will try Bevespi 2 puffs twice daily. Pt. States she does wheeze with exertion. She is using her rescue inhaler twice daily.   Test Results: PFT's 03/12/2020      12/27/2019: CT Chest WO Contrast Stable sub-cm bilateral pulmonary nodules, consistent with benign etiology. 2. Stable paramedian consolidation in the right middle lobe and lingula. 3. New airspace disease in inferior right middle lobe, with a few adjacent subcentimeter nodules. Overall, above findings are consistent with waxing and waning atypical infectious process, such as MAI.  12/07/2017 Pathology RML Mass DIAGNOSIS:  A. LUNG, RIGHT MIDDLE LOBE; ELECTROMAGNETIC NAVIGATION BRONCHOSCOPY FNA:  - NEGATIVE FOR MALIGNANCY.  - LOW CELLULARITY SAMPLE WITH MACROPHAGES AND BRONCHIAL EPITHELIAL  CELLS.   11/10/17 CTAP:No intra-abdominal findings of note. 2.2 cm spiculated density is noted in right middle lobe concerning for malignancy. CT scan of the chest with intravenous contrast is recommended for further evaluation 11/16/17 CT chest:Spiculated R middle lobe mass 2.2 x 2.0 x 2.4 cm highlysuspicious for a  primary pulmonary neoplasm.Multiple tiny nonspecific upper lobe pulmonary nodules greater on R, largest 3 mm, of uncertain etiology; these could be postinflammatory but tiny metastatic foci are not excluded 11/22/17 PET:2 cm right middle lobe lung mass is hypermetabolic and consistent with primary lung neoplasm. 2. No findings for mediastinal/hilar lymphadenopathy or metastatic disease 11/18/17 PFTs:FVC:1.88 > 2.04L (63 > 69%pred), FEV1: 1.22 >1.23L (51%pred), FEV1/FVC: 65%, TLC:4.00L (83%pred), DLCO 6.8, 32%pred, DLCO/VA 55%  CBC Latest Ref Rng & Units 12/28/2019 10/20/2019 06/19/2019  WBC 4.0 - 10.5 K/uL 4.4 5.1 4.3  Hemoglobin 12.0 - 15.0 g/dL 11.4(L) 12.5 12.9  Hematocrit 36 - 46 % 35.4(L) 39.1 39.3  Platelets 150 - 400 K/uL 132(L) 127(L) 120(L)    BMP Latest Ref Rng & Units 12/28/2019 10/20/2019 06/19/2019  Glucose 70 - 99 mg/dL 84 88 106(H)  BUN 8 - 23 mg/dL 29(H) 34(H) 30(H)  Creatinine 0.44 - 1.00 mg/dL 1.18(H) 1.30(H) 1.15(H)  Sodium 135 - 145 mmol/L 141 137 139  Potassium 3.5 - 5.1 mmol/L 3.9 4.1 4.2  Chloride 98 - 111 mmol/L 104 102 106  CO2 22 - 32 mmol/L 28 30 25   Calcium 8.9 - 10.3 mg/dL 9.1 9.4 9.4    BNP No results found for: BNP  ProBNP No results found for: PROBNP  PFT No results found for: FEV1PRE, FEV1POST, FVCPRE, FVCPOST, TLC, DLCOUNC, PREFEV1FVCRT, PSTFEV1FVCRT  No results found.   Past medical hx Past Medical History:  Diagnosis Date  . Anemia   . Atherosclerosis of native arteries of extremity with intermittent claudication ([place]) 07/20/2016  . COPD (chronic obstructive pulmonary disease) ([place])   . 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 Elevated liver function tests 11/03/2017  . GERD (  gastroesophageal reflux disease)   . Heme positive stool 01/29/2015  . Hepatitis C 07/12/2016  . HLD (hyperlipidemia)   . Hypertension   . Hypothyroidism   . 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 Thrombocytopenia  ([place]) 10/04/2016     Social History   Tobacco Use  . Smoking status: Former Smoker    Packs/day: 0.50    Years: 45.00    Pack years: 22.50    Types: Cigarettes    Quit date: 07/11/2016    Years since quitting: 3.7  . Smokeless tobacco: Never Used  Vaping Use  . Vaping Use: Never used  Substance Use Topics  . Alcohol use: No  . Drug use: No    Tina Page reports that she quit smoking about 3 years ago. Her smoking use included cigarettes. She has a 22.50 pack-year smoking history. She has never used smokeless tobacco. She reports that she does not drink alcohol and does not use drugs.  Tobacco Cessation: Former smoker quit 2017 , with a 22.5 pack year history  Past surgical hx, Family hx, Social hx all reviewed.  Current Outpatient Medications on File Prior to Visit  Medication Sig  . albuterol (PROVENTIL HFA;VENTOLIN HFA) 108 (90 Base) MCG/ACT inhaler Inhale 2 puffs into the lungs every 6 (six) hours as needed for wheezing or shortness of breath.   . allopurinol (ZYLOPRIM) 100 MG tablet TAKE 1 TABLET BY MOUTH EVERY DAY  . clopidogrel (PLAVIX) 75 MG tablet Take 1 tablet (75 mg total) by mouth daily.  . cyanocobalamin 500 MCG tablet Take 500 mcg by mouth daily.  . folic acid (FOLVITE) 1 MG tablet TAKE 1 TABLET (1 MG TOTAL) BY MOUTH DAILY.  . hydrochlorothiazide (HYDRODIURIL) 25 MG tablet Take 25 mg by mouth daily.  [name] Kitchen levothyroxine (SYNTHROID, LEVOTHROID) 75 MCG tablet Take 75 mcg by mouth daily before breakfast.   . lisinopril (PRINIVIL,ZESTRIL) 20 MG tablet Take 20 mg by mouth daily.   [name] Kitchen omeprazole (PRILOSEC) 20 MG capsule Take 1 capsule (20 mg total) by mouth daily.  . potassium chloride (KLOR-CON) 20 MEQ packet Take 20 mEq by mouth daily.  . pravastatin (PRAVACHOL) 40 MG tablet Take 40 mg by mouth every evening.   No current facility-administered medications on file prior to visit.     Allergies  Allergen Reactions  . Codeine Anaphylaxis  . Ciprofloxacin Swelling     Facial swelling following single oral dose.     Review Of Systems:  Constitutional:   No  weight loss, night sweats,  Fevers, chills, fatigue, or  lassitude.  HEENT:   No headaches,  Difficulty swallowing,  Tooth/dental problems, or  Sore throat,                No sneezing, itching, ear ache, nasal congestion, post nasal drip,   CV:  No chest pain,  Orthopnea, PND, swelling in lower extremities, anasarca, dizziness, palpitations, syncope.   GI  No heartburn, indigestion, abdominal pain, nausea, vomiting, diarrhea, change in bowel habits, loss of appetite, bloody stools.   Resp: + shortness of breath with exertion or at rest.  No excess mucus, no productive cough,  No non-productive cough,  No coughing up of blood.  No change in color of mucus.  No wheezing.  No chest wall deformity  Skin: no rash or lesions.  GU: no dysuria, change in color of urine, no urgency or frequency.  No flank pain, no hematuria   MS:  +  joint pain or swelling in right ankle that patient states is gout.  No decreased range of motion.  No back pain.  Psych:  No change in mood or affect. No depression or anxiety.  No memory loss.   Vital Signs BP 126/78 (BP Location: Left Arm, Cuff Size: Normal)   Pulse 80   Temp (!) 97.1 F (36.2 C) (Temporal)   Ht 5\' 3"  (1.6 m)   Wt 178 lb 12.8 oz (81.1 kg)   SpO2 98%   BMI 31.67 kg/m    Physical Exam:  General- No distress,  A&Ox3, pleasant ENT: No sinus tenderness, TM clear, pale nasal mucosa, no oral exudate,no post nasal drip, no LAN Cardiac: S1, S2, regular rate and rhythm, no murmur Chest: No wheeze/ rales/ dullness; no accessory muscle use, no nasal flaring, no sternal retractions Abd.: Soft Non-tender, ND, BS +, Body mass index is 31.67 kg/m. Ext: No clubbing cyanosis, no edema Neuro:  normal strength, MAE x 4, A&O x 3, appropriate Skin: No rashes, no lesions, warm and dry Psych: normal mood and behavior   Assessment/Plan COPD with recent  exacerbation ( 01/2020) No antibiotics x 6 weeks CT with concern for MAI Plan We will collect sputum for cultures and sensitivity  , AFB and fungus We will send you to the lab for sputum cups and directions. We will do a therapeutic trial of Bevespi 2 puffs twice daily.  Rinse mouth after use. Take every day without fail.  We will see if you have to use your rescue inhaler less on the maintenance therapy.  Continue using your rescue inhaler as needed for breakthrough shortness of breath or wheezing. Follow up in 4 weeks via tele visit to determine if you want to continue the [place] Please contact office for sooner follow up if symptoms do not improve or worsen or seek emergency care   Tobacco History Quit 2017 with a 22.5 pack year smoking history Plan Consider Lung Cancer Screening Let us know if you want to participate in this program.     Magdalen Spatz, NP 04/15/2020  10:20 AM

## 2020-04-15 NOTE — Telephone Encounter (Signed)
Left message for patient

## 2020-04-15 NOTE — Telephone Encounter (Signed)
Patient is aware of medication change and voiced her understanding.  Rx for stiolto has been sent to preferred pharmacy.  Nothing further is needed at this time.

## 2020-04-15 NOTE — Patient Instructions (Signed)
It is good to see you today. We will collect sputum for cultures and sensitivity  , AFB and fungus We will send you to the lab for sputum cups and directions. We will do a therapeutic trial of Bevespi 2 puffs twice daily.  Rinse mouth after use. Take every day without fail.  We will see if you have to use your rescue inhaler less on the maintenance therapy.  Continue using your rescue inhaler as needed for breakthrough shortness of breath or wheezing. Follow up in 4 weeks via tele visit to determine if you want to continue the [place] with Judson Roch NP Please contact office for sooner follow up if symptoms do not improve or worsen or seek emergency care   Tobacco History Quit 2017 with a 22.5 pack year smoking history Plan Consider Lung Cancer Screening Let us know if you want to participate in this program.

## 2020-04-18 ENCOUNTER — Other Ambulatory Visit
Admission: RE | Admit: 2020-04-18 | Discharge: 2020-04-18 | Disposition: A | Discharge status: Home / Self Care | Payer: BC Managed Care – PPO | Source: Ambulatory Visit | Attending: Acute Care | Admitting: Acute Care

## 2020-04-18 DIAGNOSIS — A310 Pulmonary mycobacterial infection: Secondary | ICD-10-CM | POA: Diagnosis present

## 2020-04-18 LAB — EXPECTORATED SPUTUM ASSESSMENT W GRAM STAIN, RFLX TO RESP C

## 2020-04-19 LAB — ACID FAST SMEAR (AFB, MYCOBACTERIA): Acid Fast Smear: NEGATIVE

## 2020-04-21 LAB — CULTURE, RESPIRATORY W GRAM STAIN: Culture: NORMAL

## 2020-05-13 ENCOUNTER — Ambulatory Visit (INDEPENDENT_AMBULATORY_CARE_PROVIDER_SITE_OTHER): Payer: BC Managed Care – PPO | Admitting: Acute Care

## 2020-05-13 ENCOUNTER — Encounter: Payer: Self-pay | Admitting: Acute Care

## 2020-05-13 ENCOUNTER — Other Ambulatory Visit: Payer: Self-pay

## 2020-05-13 DIAGNOSIS — J449 Chronic obstructive pulmonary disease, unspecified: Secondary | ICD-10-CM

## 2020-05-13 DIAGNOSIS — B379 Candidiasis, unspecified: Secondary | ICD-10-CM | POA: Diagnosis not present

## 2020-05-13 MED ORDER — FLUCONAZOLE 100 MG PO TABS: 100.0000 mg | ORAL_TABLET | Freq: Every day | ORAL | 0 refills | Status: AC

## 2020-05-13 NOTE — Progress Notes (Signed)
Virtual Visit via Telephone Note  I connected with Tina Page on 05/13/20 at  9:30 AM EDT by telephone and verified that I am speaking with the correct person using two identifiers.  Location: Patient: At home Provider: [place], [place], [place], [address]   I discussed the limitations, risks, security and privacy concerns of performing an evaluation and management service by telephone and the availability of in person appointments. I also discussed with the patient that there may be a patient responsible charge related to this service. The patient expressed understanding and agreed to proceed.   History of Present Illness: Pt. Presents for follow up. She was initially seen 02/19/2020 for a surveillance CT which had an incidental finding of  She states she is using her rescue inhaler about twice daily Surveillance showed infection with  New airspace disease in inferior right middle lobe, with a few adjacent subcentimeter nodules. Overall,findings were consistent with waxing and waning atypical infectious process, such as MAI.She was treated with Doxycycline , with sputum culture prior to starting antibiotics. She completed her antibiotics, and we had a follow up 03/06/2020. She was complaining at the time of dyspnea with exertion. We had planned on starting her on Bevespi as her last PFT's had indicated moderate obstruction. She has continued to smoke. Insurance would not cover [place], so she was started on [place], which she states has not helped her breathing at all.  Sputum culture was + for moderate growth of Candida Albicans. She states she is currently doing well and has not been sick. Sputum is clear. She is using her rescue about 2 times a day. She feels the [place] has caused her to have to use her rescue inhaler more. She would like to stop using it as she does not feel benefit of therapy.   I have asked her to follow up after stopping in 2 weeks of stopping the Stialto,  but to call sooner if she has worsening off the [place].      Observations/Objective:    12/27/2019: CT Chest WO Contrast Stable sub-cm bilateral pulmonary nodules, consistent with benign etiology. 2. Stable paramedian consolidation in the right middle lobe and lingula. 3. New airspace disease in inferior right middle lobe, with a few adjacent subcentimeter nodules. Overall, above findings are consistent with waxing and waning atypical infectious process, such as MAI.  12/07/2017 Pathology RML Mass DIAGNOSIS:  A. LUNG, RIGHT MIDDLE LOBE; ELECTROMAGNETIC NAVIGATION BRONCHOSCOPY FNA:  - NEGATIVE FOR MALIGNANCY.  - LOW CELLULARITY SAMPLE WITH MACROPHAGES AND BRONCHIAL EPITHELIAL  CELLS.   11/10/17 CTAP:No intra-abdominal findings of note. 2.2 cm spiculated density is noted in right middle lobe concerning for malignancy. CT scan of the chest with intravenous contrast is recommended for further evaluation 11/16/17 CT chest:Spiculated R middle lobe mass 2.2 x 2.0 x 2.4 cm highlysuspicious for a primary pulmonary neoplasm.Multiple tiny nonspecific upper lobe pulmonary nodules greater on R, largest 3 mm, of uncertain etiology; these could be postinflammatory but tiny metastatic foci are not excluded 11/22/17 PET:2 cm right middle lobe lung mass is hypermetabolic and consistent with primary lung neoplasm. 2. No findings for mediastinal/hilar lymphadenopathy or metastatic disease 11/18/17 PFTs:FVC:1.88 > 2.04L (63 > 69%pred), FEV1: 1.22 > 1.23L (51%pred), FEV1/FVC: 65%, TLC:4.00L (83%pred), DLCO 6.8, 32%pred, DLCO/VA 55%  Assessment and Plan: Dyspnea on exertion COPD with recent exacerbation ( 01/2020) Frequent exacerbations Plan Stop Stialto as you feel it is not helping you. Continue to use your rescue inhaler as needed. Consider Daliresp  at follow up  Please call the office if you find you are using this more after stopping the [facility]   CT with concern for  MAI Sputum Cx + for candida moderate growth Plan We will call in a prescription for Diflucan We will prescribe a 200 mg loading dose first day, and then 100 mg daily for the following 6 days.  This is to treat the Candida in your sputum.  Follow up Tele visit in 2 weeks Call sooner if you find you are more short of breath off the [place], and restart if this does happen.  Please contact office for sooner follow up if symptoms do not improve or worsen or seek emergency care    Follow Up Instructions: Follow up Tele visit in 2 weeks Call sooner if you need [name] sooner     Covered alternatives by patient's insurance  are fluticasone-salmeterol, glycopyrrolate PF, Anoro, Stiolto, Trelegy, combivent, Symbicort or [employer].  I discussed the assessment and treatment plan with the patient. The patient was provided an opportunity to ask questions and all were answered. The patient agreed with the plan and demonstrated an understanding of the instructions.   The patient was advised to call back or seek an in-person evaluation if the symptoms worsen or if the condition fails to improve as anticipated.  I provided 35 minutes of non-face-to-face time during this encounter.   Magdalen Spatz, NP 05/13/2020 10:25 AM

## 2020-05-13 NOTE — Patient Instructions (Addendum)
Stop Stialto as you feel it is not helping you. Continue to use your rescue inhaler as needed.  Please call the office if you find you are using this more after stopping the Stialto We will call in a prescription for Diflucan We will prescribe a 200 mg loading dose first day, and then 100 mg daily for the following 6 days.  This is to treat the Candida in your sputum.  Follow up Tele visit in 2 weeks Call sooner if you find you are more short of breath off the [place], and restart if this does happen.  Please contact office for sooner follow up if symptoms do not improve or worsen or seek emergency care

## 2020-05-15 LAB — FUNGUS CULTURE WITH STAIN

## 2020-05-15 LAB — FUNGUS CULTURE RESULT

## 2020-05-15 LAB — FUNGAL ORGANISM REFLEX

## 2020-05-27 ENCOUNTER — Encounter: Payer: Self-pay | Admitting: Primary Care

## 2020-05-27 ENCOUNTER — Ambulatory Visit
Admission: RE | Admit: 2020-05-27 | Discharge: 2020-05-27 | Disposition: A | Discharge status: Home / Self Care | Payer: BC Managed Care – PPO | Source: Ambulatory Visit | Attending: Primary Care | Admitting: Primary Care

## 2020-05-27 ENCOUNTER — Other Ambulatory Visit
Admission: RE | Admit: 2020-05-27 | Discharge: 2020-05-27 | Disposition: A | Discharge status: Home / Self Care | Payer: BC Managed Care – PPO | Source: Home / Self Care | Attending: Primary Care | Admitting: Primary Care

## 2020-05-27 ENCOUNTER — Ambulatory Visit
Admission: RE | Admit: 2020-05-27 | Discharge: 2020-05-27 | Disposition: A | Discharge status: Home / Self Care | Payer: BC Managed Care – PPO | Attending: Primary Care | Admitting: Primary Care

## 2020-05-27 ENCOUNTER — Other Ambulatory Visit: Payer: Self-pay

## 2020-05-27 ENCOUNTER — Ambulatory Visit: Payer: BC Managed Care – PPO | Admitting: Primary Care

## 2020-05-27 VITALS — BP 128/70 | HR 82 | Temp 97.5°F | Ht 63.0 in | Wt 177.2 lb

## 2020-05-27 DIAGNOSIS — R918 Other nonspecific abnormal finding of lung field: Secondary | ICD-10-CM

## 2020-05-27 DIAGNOSIS — A310 Pulmonary mycobacterial infection: Secondary | ICD-10-CM | POA: Diagnosis not present

## 2020-05-27 DIAGNOSIS — J449 Chronic obstructive pulmonary disease, unspecified: Secondary | ICD-10-CM | POA: Diagnosis not present

## 2020-05-27 DIAGNOSIS — Z87891 Personal history of nicotine dependence: Secondary | ICD-10-CM

## 2020-05-27 DIAGNOSIS — R9389 Abnormal findings on diagnostic imaging of other specified body structures: Secondary | ICD-10-CM | POA: Diagnosis not present

## 2020-05-27 DIAGNOSIS — B370 Candidal stomatitis: Secondary | ICD-10-CM

## 2020-05-27 MED ORDER — TIOTROPIUM BROMIDE 2.5 MCG/ACTUATION MIST FOR INHALATION
RESPIRATORY_TRACT | 0.00000 days
Start: 2020-05-27 — End: ?

## 2020-05-27 MED ORDER — SPIRIVA RESPIMAT 2.5 MCG/ACT IN AERS: 2.0000 | INHALATION_SPRAY | Freq: Every day | RESPIRATORY_TRACT | 0 refills | Status: DC

## 2020-05-27 NOTE — Progress Notes (Signed)
Please let patient know her CXR looked ok/stable. She had some atelectasis (deflated airsacs) right middle lung but no signs of pneumonia. Lungs otherwise clear. Encourage deep breathing exercises with IS if she has one, if not we can send an order

## 2020-05-27 NOTE — Patient Instructions (Addendum)
Recommendations: - Trial Spiriva respimat- take 2 puffs once daily in the morning - Call insurance and see if they cover Spiriva inhaler, if so and it helps your breathing let us know and we will send in a prescription.   Referral: Lung cancer screening program re: former smoker > 30 pack year hx  Orders: CXR today  Repeat Sputum culture- fungal   Follow-up: 3 months with Dr. Mortimer Fries

## 2020-05-27 NOTE — Progress Notes (Signed)
@Patient  ID: Tina Page, female    DOB: 1955-10-18, 64 y.o.   MRN: [id number]  Chief Complaint  Patient presents with  . Follow-up    Patient has SOB with exertion. Patient denies any other complaints.    Referring provider: Frazier Richards, MD  HPI: 64 year old female, former smoker quit in 2017 (63-pack-year history).  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  Patient of Dr. Mortimer Fries, last seen by pulmonary nurse practitioner on 05/13/2020.  05/27/2020 Patient presents today for 2-week follow-up.  CT in April showed new airspace disease right middle lobe with a few adjacent subcentimeter nodules findings consistent with waxing and waning atypical infectious process such as MAI.  She was treated with doxycycline. Prior to starting antibiotics her sputum culture grew out moderate yeast. AFB NEGATIVE. She was treated with a prescription for Diflucan x1 week. She continues to have some cough with rare mucus production. Sputum is clear. Breathing overall is ok. Experience dyspnea with exertion, she gets out of breath walking from parking lot to medical building. She was started on Bevespi but inhaler is not covered by insurance. Sent in prescription for Stiolto 2 puffs once daily. She is currently not using Stiolto because she did not notice any significant improvement. She uses her albuterol twice a day. She had bronchoscopy with Dr. Mortimer Fries back in 2019. Pathology results +organizing pneumonia, negative for malignancy. Repeat CT chest in June 2019 showed almost complete resolution of spiculated nodules RML. Stable 19mm right parahilar nodules since March 2019 but not present in 2017.    Imaging: 02/16/18 CT chest wo contrast- showed almost complete resolution of spiculated nodules RML.  Stable 84mm right parahilar nodules since March 2019 but not present in 2017.   06/27/19 CT chest wo contrast-  previously described nodule of the right middle lobe has almost completely resolved, with residual linear scarring. There is persistent paramedian consolidation of the medial right middle lobe and lingula generally in keeping with atypical infection, particularly atypical mycobacterium. Interval increase in size of a small nodule of the right middle lobe, now measuring 4 mm, previously 2 mm Unchanged tiny biapical pulmonary nodules, benign sequelae of nonspecific infection or inflammation, possibly related cigarette smoking, atypical infection, or other inhalational, inflammatory lung disease.  12/27/19 CT Chest wo contrast- stable sub-centimeter bilateral pulmonary nodules consistent with benign etiology. Stable paramedian consolidation RML and lingula. New airspace disease inferior RML with a few adjacent subcenterimeter nodules. Overall, consistent with waxing and waning atypical infectious process such as MAI   Allergies  Allergen Reactions  . Codeine Anaphylaxis  . Ciprofloxacin Swelling    Facial swelling following single oral dose.     Immunization History  Administered Date(s) Administered  . Influenza,inj,Quad PF,6+ Mos 05/17/2018  . Influenza-Unspecified 05/17/2018, 05/16/2019  . Moderna SARS-COVID-2 Vaccination 11/14/2019, 12/08/2019  . Pneumococcal Polysaccharide-23 07/22/2016    Past Medical History:  Diagnosis Date  . Anemia   . Atherosclerosis of native arteries of extremity with intermittent claudication ([place]) 07/20/2016  . COPD (chronic obstructive pulmonary disease) ([place])   . 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 Elevated liver function tests 11/03/2017  . GERD (gastroesophageal reflux disease)   . Heme positive stool 01/29/2015  . Hepatitis C 07/12/2016  . HLD (hyperlipidemia)   . Hypertension   . Hypothyroidism   . 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 Thrombocytopenia ([place]) 10/04/2016    Tobacco History: Social History   Tobacco Use  Smoking Status Former Smoker  . Packs/day: 0.50  . Years: 45.00  . Pack years: 22.50  . Types: Cigarettes  . Quit date: 07/11/2016  . Years since quitting: 3.9  Smokeless Tobacco Never Used   Counseling given: Not Answered   Outpatient Medications Prior to Visit  Medication Sig Dispense Refill  . albuterol (PROVENTIL HFA;VENTOLIN HFA) 108 (90 Base) MCG/ACT inhaler Inhale 2 puffs into the lungs every 6 (six) hours as needed for wheezing or shortness of breath.     . allopurinol (ZYLOPRIM) 100 MG tablet TAKE 1 TABLET BY MOUTH EVERY DAY 30 tablet 5  . clopidogrel (PLAVIX) 75 MG tablet Take 1 tablet (75 mg total) by mouth daily. 30 tablet 5  . cyanocobalamin 500 MCG tablet Take 500 mcg by mouth daily.    . folic acid (FOLVITE) 1 MG tablet TAKE 1 TABLET (1 MG TOTAL) BY MOUTH DAILY. 90 tablet 1  . hydrochlorothiazide (HYDRODIURIL) 25 MG tablet Take 25 mg by mouth daily.    [name] Kitchen levothyroxine (SYNTHROID, LEVOTHROID) 75 MCG tablet Take 75 mcg by mouth daily before breakfast.     . lisinopril (PRINIVIL,ZESTRIL) 20 MG tablet Take 20 mg by mouth daily.     [name] Kitchen omeprazole (PRILOSEC) 20 MG capsule Take 1 capsule (20 mg total) by mouth daily. 60 capsule 0  . potassium chloride (KLOR-CON) 20 MEQ packet Take 20 mEq by mouth daily.    . pravastatin (PRAVACHOL) 40 MG tablet Take 40 mg by mouth every evening.    . Tiotropium Bromide-Olodaterol (STIOLTO RESPIMAT) 2.5-2.5 MCG/ACT AERS Inhale 2 puffs into the lungs daily. 4 g 2   No facility-administered medications prior to visit.   Review of Systems  Review of Systems  Constitutional: Negative.   HENT: Negative.   Respiratory: Negative for cough, chest tightness, shortness of breath and wheezing.        DOE  Cardiovascular: Negative.     Physical Exam  BP 128/70 (BP Location: Left Arm, Patient Position: Sitting, Cuff Size:  Normal)   Pulse 82   Temp (!) 97.5 F (36.4 C) (Temporal)   Ht 5\' 3"  (1.6 m)   Wt 177 lb 3.2 oz (80.4 kg)   SpO2 100%   BMI 31.39 kg/m  Physical Exam Constitutional:      Appearance: Normal appearance. She is not ill-appearing.  HENT:     Head: Normocephalic and atraumatic.     Right Ear: External ear normal.     Left Ear: External ear normal.     Mouth/Throat:     Mouth: Mucous membranes are moist.     Pharynx: Oropharynx is clear. No oropharyngeal exudate or posterior oropharyngeal erythema.  Cardiovascular:     Rate and Rhythm: Normal rate and regular rhythm.  Pulmonary:     Effort: Pulmonary effort is normal.     Breath sounds: Normal breath sounds. No wheezing or rhonchi.  Musculoskeletal:        General: Normal range of motion.     Cervical back: Normal range of motion and neck supple.  Skin:    General: Skin is warm and dry.  Neurological:     General: No focal deficit present.     Mental Status: She is alert and oriented to person, place, and time. Mental status is at baseline.  Psychiatric:        Mood and Affect: Mood normal.  Behavior: Behavior normal.        Thought Content: Thought content normal.        Judgment: Judgment normal.      Lab Results:  CBC    Component Value Date/Time   WBC 4.4 12/28/2019 1325   RBC 3.60 (L) 12/28/2019 1325   HGB 11.4 (L) 12/28/2019 1325   HGB 9.0 (L) 02/18/2018 1204   HCT 35.4 (L) 12/28/2019 1325   HCT 46.5 06/11/2014 1732   PLT 132 (L) 12/28/2019 1325   PLT 127 (L) 06/11/2014 1732   MCV 98.3 12/28/2019 1325   MCV 92 06/11/2014 1732   MCH 31.7 12/28/2019 1325   MCHC 32.2 12/28/2019 1325   RDW 13.4 12/28/2019 1325   RDW 14.5 06/11/2014 1732   LYMPHSABS 1.0 12/28/2019 1325   MONOABS 0.4 12/28/2019 1325   EOSABS 0.1 12/28/2019 1325   BASOSABS 0.0 12/28/2019 1325    BMET    Component Value Date/Time   NA 141 12/28/2019 1325   NA 142 06/11/2014 1732   K 3.9 12/28/2019 1325   K 3.1 (L) 06/11/2014 1732    CL 104 12/28/2019 1325   CL 103 06/11/2014 1732   CO2 28 12/28/2019 1325   CO2 32 06/11/2014 1732   GLUCOSE 84 12/28/2019 1325   GLUCOSE 100 (H) 06/11/2014 1732   BUN 29 (H) 12/28/2019 1325   BUN 15 06/11/2014 1732   CREATININE 1.18 (H) 12/28/2019 1325   CREATININE 0.73 06/11/2014 1732   CALCIUM 9.1 12/28/2019 1325   CALCIUM 8.9 06/11/2014 1732   GFRNONAA 50 (L) 12/28/2019 1325   GFRNONAA >60 06/11/2014 1732   GFRAA 58 (L) 12/28/2019 1325   GFRAA >60 06/11/2014 1732    BNP No results found for: BNP  ProBNP No results found for: PROBNP  Imaging: DG Chest 2 View  Result Date: 05/27/2020 CLINICAL DATA:  Recent pneumonia EXAM: CHEST - 2 VIEW COMPARISON:  Chest CT December 27, 2019 FINDINGS: There is slight atelectatic change in the right middle lobe. Lungs elsewhere are clear. Heart size and pulmonary vascularity are normal. No adenopathy. There is aortic atherosclerosis. No bone lesions IMPRESSION: Mild right middle lobe atelectasis. Lungs elsewhere clear. Heart size normal. Aortic Atherosclerosis (ICD10-I70.0). Electronically Signed   By: Lowella Grip III M.D.   On: 05/27/2020 16:42     Assessment & Plan:   COPD (chronic obstructive pulmonary disease) ([facility]) - Chronic dyspnea on exertion and cough. No benefit from [employer] and bevespi not covered - Sample Spiriva respimat 2.101mcg given   Abnormal CT of the chest - Hx spiculated nodules and consolidation RML. She had bronchoscopy in 2019 with Dr. Mortimer Fries that showed organizing pneumonia, negative malignancy. Most recent CT chest in April 2021 showed stable sub-centimeter bilateral pulmonary nodules consistent with benign etiology. Stable paramedian consolidation RML and lingula. New airspace disease inferior RML with few adjacent sub-cm nodules consistent with waxing and waning atypical infection such as MAI. Completed course of Doxycycline. AFB culture was negative in August 2021 and respiratory culture showed few normal  respiratory flora, no staph aureus or pseudomonas. Repeat CXR today. She needs follow-up in 3 months with Dr. Mortimer Fries.  Oral candidiasis - Prior to starting antibiotics her sputum culture grew out moderate yeast. She was treated with a prescription for Diflucan x1 week. Repeat preliminary fungal cultures have shown no growth   Former smoker - Former smoker, she smoked from age [age] x 1.5 ppd  - Lung cancer screening program re: former smoker > 30 pack year  hx   Martyn Ehrich, NP 06/04/2020

## 2020-05-28 ENCOUNTER — Other Ambulatory Visit: Payer: Self-pay

## 2020-05-28 DIAGNOSIS — J449 Chronic obstructive pulmonary disease, unspecified: Secondary | ICD-10-CM

## 2020-05-29 ENCOUNTER — Other Ambulatory Visit
Admission: RE | Admit: 2020-05-29 | Discharge: 2020-05-29 | Disposition: A | Discharge status: Home / Self Care | Payer: BC Managed Care – PPO | Source: Ambulatory Visit | Attending: Primary Care | Admitting: Primary Care

## 2020-05-29 DIAGNOSIS — A310 Pulmonary mycobacterial infection: Secondary | ICD-10-CM | POA: Insufficient documentation

## 2020-05-31 ENCOUNTER — Telehealth: Payer: Self-pay

## 2020-05-31 NOTE — Telephone Encounter (Signed)
Contacted patient for lung CT screening clinic based on referral from Derl Barrow, NP. Message left for patient to call Burgess Estelle, lung navigator.

## 2020-06-03 ENCOUNTER — Encounter: Payer: Self-pay | Admitting: *Deleted

## 2020-06-03 LAB — ACID FAST CULTURE WITH REFLEXED SENSITIVITIES (MYCOBACTERIA): Acid Fast Culture: NEGATIVE

## 2020-06-04 ENCOUNTER — Encounter: Payer: Self-pay | Admitting: Primary Care

## 2020-06-04 DIAGNOSIS — B370 Candidal stomatitis: Secondary | ICD-10-CM | POA: Insufficient documentation

## 2020-06-04 DIAGNOSIS — R9389 Abnormal findings on diagnostic imaging of other specified body structures: Secondary | ICD-10-CM | POA: Insufficient documentation

## 2020-06-04 DIAGNOSIS — Z87891 Personal history of nicotine dependence: Secondary | ICD-10-CM | POA: Insufficient documentation

## 2020-06-04 NOTE — Assessment & Plan Note (Signed)
-   Former smoker, she smoked from age [age] x 1.5 ppd  - Lung cancer screening program re: former smoker > 30 pack year hx

## 2020-06-04 NOTE — Assessment & Plan Note (Addendum)
-   Prior to starting antibiotics her sputum culture grew out moderate yeast. She was treated with a prescription for Diflucan x1 week. Repeat preliminary fungal cultures have shown no growth

## 2020-06-04 NOTE — Assessment & Plan Note (Addendum)
-   Chronic dyspnea on exertion and cough. No benefit from [employer] and bevespi not covered - Sample Spiriva respimat 2.76mcg given

## 2020-06-04 NOTE — Assessment & Plan Note (Addendum)
-   Hx spiculated nodules and consolidation RML. She had bronchoscopy in 2019 with Dr. Mortimer Fries that showed organizing pneumonia, negative malignancy. Most recent CT chest in April 2021 showed stable sub-centimeter bilateral pulmonary nodules consistent with benign etiology. Stable paramedian consolidation RML and lingula. New airspace disease inferior RML with few adjacent sub-cm nodules consistent with waxing and waning atypical infection such as MAI. Completed course of Doxycycline. AFB culture was negative in August 2021 and respiratory culture showed few normal respiratory flora, no staph aureus or pseudomonas. Repeat CXR today. She needs follow-up in 3 months with Dr. Mortimer Fries.

## 2020-06-11 ENCOUNTER — Telehealth: Payer: Self-pay | Admitting: *Deleted

## 2020-06-11 NOTE — Telephone Encounter (Signed)
Patient called reporting that Dr Holley Raring drew labs on her and that her hgb is 8.5 and he wants her to see Dr Mike Gip regarding this. Please contact patient about an appointment.

## 2020-06-11 NOTE — Progress Notes (Signed)
Tina Page  [address], [address] [place], [place] [address] Phone: [phone]  Fax: [phone]   Clinic Day:  06/12/2020  Referring physician: Frazier Richards, MD  Chief Complaint: Tina Page is a 64 y.o. female with a right middle lobe mass, warm and cold autoimmune hemolytic anemia, and renal insufficiency who is seen for reassessment.   HPI: The patient was last seen in the hematology clinic on 12/28/2019. At that time, she was doing well.  She denied any B symptoms.  Exam revealed no adenopathy or hepatosplenomegaly. Hematocrit was 35.4, hemoglobin 11.4, MCV 98.3, platelets 132,000, WBC 4,400. BUN was 29. Creatinine was 1.18 (CrCl 50 ml/min). Folate was 34.0 and vitamin B12 was 752. LDH was 174.  The patient saw Dr. Holley Raring on 05/23/2020. Her chronic kidney disease appeared to be stable. She was to continue lisinopril 40 mg daily. Follow up was planned for 4 months.   Labs on 05/23/2020 included a hematocrit of 27.1, hemoglobin 8.6, MCV 101.1, platelets 145,000, WBC 4100 with an Caruthers of 3849.  BUN was 29 with a creatinine of 1.25.  Albumen was 4.3.  The patient saw Geraldo Pitter, NP on 05/27/2020. Her breathing was okay. She continued to have shortness of breath and cough with rare mucus production. She was using an albuterol inhaler BID. CXR revealed mild right middle lobe atelectasis. Lungs elsewhere were clear. Heart size was normal. There was aortic atherosclerosis.  During the interim, she has been "tired."  She has been fatigued for the past month and notes occasional dizziness. She denies any illnesses or new medications/herbal products over the past 3 months. She took a week of doxycycline in 01/2020 for her lungs. She denies jaundice, change in urine color, blood in the stool, and black stool.  The patient eats an iron-rich diet. She eats red meat and salads. She does not drink quinine water. She takes Vitamin B12 500 mcg daily and folic acid  1 mg daily.   Past Medical History:  Diagnosis Date  . Anemia   . Atherosclerosis of native arteries of extremity with intermittent claudication ([place]) 07/20/2016  . COPD (chronic obstructive pulmonary disease) ([place])   . 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 Elevated liver function tests 11/03/2017  . GERD (gastroesophageal reflux disease)   . Heme positive stool 01/29/2015  . Hepatitis C 07/12/2016  . HLD (hyperlipidemia)   . Hypertension   . Hypothyroidism   . 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 Thrombocytopenia ([place]) 10/04/2016    Past Surgical History:  Procedure Laterality Date  . ELECTROMAGNETIC NAVIGATION BROCHOSCOPY N/A 12/07/2017   Procedure: ELECTROMAGNETIC NAVIGATION BRONCHOSCOPY;  Surgeon: Flora Lipps, MD;  Location: [facility] ORS;  Service: Cardiopulmonary;  Laterality: N/A;  . ESOPHAGOGASTRODUODENOSCOPY (EGD) WITH PROPOFOL N/A 07/08/2016   Procedure: ESOPHAGOGASTRODUODENOSCOPY (EGD) WITH PROPOFOL;  Surgeon: Manya Silvas, MD;  Location: [facility] ENDOSCOPY;  Service: Endoscopy;  Laterality: N/A;  . KNEE SURGERY Right    repair of acl tear  . PERIPHERAL VASCULAR CATHETERIZATION N/A 07/10/2016   Procedure: Lower Extremity Angiography;  Surgeon: Katha Cabal, MD;  Location: [place] [facility] LAB;  Service: Cardiovascular;  Laterality: N/A;  . PERIPHERAL VASCULAR CATHETERIZATION N/A 07/10/2016   Procedure: Abdominal Aortogram w/Lower Extremity;  Surgeon: Katha Cabal, MD;  Location: [place] [facility];  Service: Cardiovascular;  Laterality: N/A;  . PERIPHERAL VASCULAR CATHETERIZATION  07/10/2016   Procedure: Lower Extremity Intervention;  Surgeon: Katha Cabal, MD;  Location: [place] [facility] LAB;  Service: Cardiovascular;;    Family History  Problem Relation Age of Onset  . Diabetes Mother   . Hypertension Mother   . Diabetes Maternal Grandfather   . Hypertension Maternal Grandfather   . Breast cancer Neg Hx     Social  History:  reports that she quit smoking about 3 years ago. Her smoking use included cigarettes. She has a 22.50 pack-year smoking history. She has never used smokeless tobacco. She reports that she does not drink alcohol and does not use drugs. She has a 21 pack year smoking history (1/2 pack/day from age [age]). The patient works at [employer] She is exposed to cold temperatures. She works 3rd shift. She has a daughter, [name] and a daughter named [name]. She lives in [place]. The patient is alone today.  Allergies:  Allergies  Allergen Reactions  . Codeine Anaphylaxis  . Ciprofloxacin Swelling    Facial swelling following single oral dose.     Current Medications: Current Outpatient Medications  Medication Sig Dispense Refill  . albuterol (PROVENTIL HFA;VENTOLIN HFA) 108 (90 Base) MCG/ACT inhaler Inhale 2 puffs into the lungs every 6 (six) hours as needed for wheezing or shortness of breath.     . allopurinol (ZYLOPRIM) 100 MG tablet TAKE 1 TABLET BY MOUTH EVERY DAY 30 tablet 5  . clopidogrel (PLAVIX) 75 MG tablet Take 1 tablet (75 mg total) by mouth daily. 30 tablet 5  . cyanocobalamin 500 MCG tablet Take 500 mcg by mouth daily.    . folic acid (FOLVITE) 1 MG tablet TAKE 1 TABLET (1 MG TOTAL) BY MOUTH DAILY. 90 tablet 1  . hydrochlorothiazide (HYDRODIURIL) 25 MG tablet Take 25 mg by mouth daily.    [name] Kitchen levothyroxine (SYNTHROID, LEVOTHROID) 75 MCG tablet Take 75 mcg by mouth daily before breakfast.     . lisinopril (PRINIVIL,ZESTRIL) 20 MG tablet Take 20 mg by mouth daily.     [name] Kitchen omeprazole (PRILOSEC) 20 MG capsule Take 1 capsule (20 mg total) by mouth daily. 60 capsule 0  . potassium chloride (KLOR-CON) 20 MEQ packet Take 20 mEq by mouth daily.    . pravastatin (PRAVACHOL) 40 MG tablet Take 40 mg by mouth every evening.    . Tiotropium Bromide Monohydrate (SPIRIVA RESPIMAT) 2.5 MCG/ACT AERS Inhale 2 puffs into the lungs daily for 1 day. 1 each 0   No current  facility-administered medications for this visit.    Review of Systems  Constitutional: Positive for malaise/fatigue (x 1 month) and weight loss (2 lbs). Negative for chills, diaphoresis and fever.  HENT: Negative for congestion, ear discharge, ear pain, hearing loss, nosebleeds, sinus pain, sore throat and tinnitus.   Eyes: Negative for blurred vision.  Respiratory: Negative for cough, hemoptysis, sputum production and shortness of breath.   Cardiovascular: Negative for chest pain, palpitations and leg swelling.  Gastrointestinal: Negative for abdominal pain, blood in stool, constipation, diarrhea, heartburn, melena, nausea and vomiting.       Eats an iron-rich diet.  Genitourinary: Negative for dysuria, frequency, hematuria and urgency.  Musculoskeletal: Negative for back pain, joint pain, myalgias and neck pain.  Skin: Negative for itching and rash.  Neurological: Positive for dizziness (occasional). Negative for tingling, sensory change, weakness and headaches.  Endo/Heme/Allergies: Does not bruise/bleed easily.  Psychiatric/Behavioral: Negative for depression and memory loss. The patient is not nervous/anxious and does not have insomnia.   All other systems reviewed and are negative.  Performance status (ECOG): 1  Blood pressure [name] Kitchen)  141/57, pulse 73, temperature (!) 97.3 F (36.3 C), resp. rate 18, height 5\' 3"  (1.6 m), weight 176 lb 14.7 oz (80.3 kg), SpO2 100 %.   Physical Exam Vitals and nursing note reviewed.  Constitutional:      General: She is not in acute distress.    Appearance: She is not diaphoretic.  HENT:     Head: Normocephalic and atraumatic.     Comments: Long gray hair.    Mouth/Throat:     Mouth: Mucous membranes are moist.     Pharynx: Oropharynx is clear.  Eyes:     General: No scleral icterus.    Extraocular Movements: Extraocular movements intact.     Conjunctiva/sclera: Conjunctivae normal.     Pupils: Pupils are equal, round, and reactive to light.       Comments: Dark rimmed glasses.  Cardiovascular:     Rate and Rhythm: Normal rate and regular rhythm.     Heart sounds: Normal heart sounds. No murmur heard.   Pulmonary:     Effort: Pulmonary effort is normal. No respiratory distress.     Breath sounds: Normal breath sounds. No wheezing or rales.  Chest:     Chest wall: No tenderness.  Abdominal:     General: Bowel sounds are normal. There is no distension.     Palpations: Abdomen is soft. There is no hepatomegaly, splenomegaly or mass.     Tenderness: There is no abdominal tenderness. There is no guarding or rebound.  Musculoskeletal:        General: No swelling or tenderness. Normal range of motion.     Cervical back: Normal range of motion and neck supple.  Lymphadenopathy:     Head:     Right side of head: No preauricular, posterior auricular or occipital adenopathy.     Left side of head: No preauricular, posterior auricular or occipital adenopathy.     Cervical: No cervical adenopathy.     Upper Body:     Right upper body: No supraclavicular or axillary adenopathy.     Left upper body: No supraclavicular or axillary adenopathy.     Lower Body: No right inguinal adenopathy. No left inguinal adenopathy.  Skin:    General: Skin is warm and dry.     Coloration: Skin is not jaundiced.  Neurological:     Mental Status: She is alert and oriented to person, place, and time.  Psychiatric:        Behavior: Behavior normal.        Thought Content: Thought content normal.        Judgment: Judgment normal.    No visits with results within 3 Day(s) from this visit.  Latest known visit with results is:  [facility] Outpatient Visit on 05/29/2020  Component Date Value Ref Range Status  . Fungus Stain 05/29/2020 Final report   Final   Comment: (NOTE) Performed At: [facility] [place], [place] [id number] Rush Farmer MD IZ:[id number]   . Fungus (Mycology) Culture 05/29/2020 PENDING   Incomplete  .  Fungal Source 05/29/2020 SPUTUM   Final   Performed at [facility], [address] [place], [place] [address]  . Result 1 05/29/2020 Comment   Final   Comment: (NOTE) KOH/Calcofluor preparation:  no fungus observed. 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 One week prior to presentation, she felt like she was coming down with something. She denied any fever, runny nose, sore throat or cough. She had some diarrhea. She denied any new medications or herbal products.  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 Reticulocyte count was 11.3%. Ferritin was 562. Iron studies revealed a saturation of 20% and a TIBC of 214 (low). B12 was 231 (low normal). Folate was 42. Peripheral smear revealed rouleaux formation. Labs on 06/21/2019revealed normal flow cytometry, SPEP, and immunoglobulins.  Additional testingincluded the following+ studies: hepatitis C antibody,hepatitis B core antibody,CMV IgM, and EBV VCA (IgM and IgG). Hepatitis B by PCRwas negative. Hepatitis C RNAwas negative. Mycoplasma pneumonia IgM was negative. Reticulocyte count was 11.3% (high) indicating appropriate marrow response. C3 and C4 were normal. Cold agglutinin titer was negative x 2. Negative studies included: ANA, hepatitis B surface antigen, SPEP, and free light chain ratio. 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 MMA was initially 330 (normal). Repeat MMA was elevated on 08/01/2016 confirming B12 deficiency. Hepatitis B surface antibody was positive and hepatitis B surface antigen was negative on 08/06/2016.  Chest, abdomen, and pelvis CTangiogram on 07/07/2016 revealed moderate diffuse atherosclerotic vascular  disease of the abdominal aorta with severe stenosis of the left common iliac artery with suspected short segment occlusion. There was no adenopathy. Spleen was normal. Abdominal ultrasoundon 04/15/2017 revealed a normal spleen and sludge in the gallbladder. The liver was echogenic consistent fatty infiltration and/or hepatocellular disease.  She underwent PTCA and stent placementin right and left common iliac arteries and left external iliac artery on 07/10/2016.She is on Plavix.EGD on 03/19/2015 revealed gastritis in the body and antrum. Colonoscopyon 03/19/2015 revealed one hyperplastic polyp. EGDon 07/08/2016 was normal. No evidence of bleeding.  She has received6 units of warmed PRBCsto date (last on 08/01/2016).If Rituxan is needed, she requires entecavir 0.5 mg q day beginning 2 weeks prior to Rituxan. In addition, hepatitis B viral level and LFTs should be checked monthly.  She began steroids(1 mg/kg) on 08/01/2016. She stopped prednisoneon 03/12/2017. She is onfolic acid1 mg a day.   She hasB12 deficiency. B12 was 231 on 07/09/2016. She is on oral B12. B12 and folate were normal on 03/09/2017 and 10/12/2017.  She developed peri-oral and intranasalherpes simplex-1. She was treated with valacyclovir and doxycycline on 10/19/2016. She developed a transient elevated alkaline phosphataseon 10/19/2016 which subsequently normalized.  She was documented to have awarm autoantibodyon 10/12/2017. LDH and bilirubin were normal. Hematocrit had improved.   She developedflu like symptomson 10/26/2017. Symptoms included cough, myalgias, and fever (tmax unknown). She was prescribed Mucinex, Tamiflu, and amoxicillin. She only took amoxicillin x 5 days. She developedincreased liver function testson 11/02/2017. CMV IgG was positive. EBV VCA IgG, NA IgG, early antigen antibody IgG were elevated. EBV VCA IgM was <36. Testing was c/w a convalescence/past  infection or reactivated infection. LFTs normalized on 11/15/2017. Smooth muscle antibodywas 39 (high) on 11/10/2017 and 34 (high) on 11/30/2017.   Abdomen and pelvic CTon 11/10/2017 revealed a 2.2 cm spiculated density in right middle lobe concerning for malignancy. Chest CTon 11/16/2017 revealed a 2.2 x 2.0 x 2.4 spiculated RIGHT middle lobe mass. There was tiny nonspecific upper lobe pulmonary nodules greater on RIGHT, largest 3 mm, of uncertain etiology. There was additional 8 x 8 mm opacity in the posterior sulcus of the RIGHT lower lobe.  PET scanon 11/22/2017 revealed a 2 cm hypermetabolic right middle lobe lung mass (SUV 3.4)  consistent with primary lung neoplasm. There were no findings for mediastinal/hilar lymphadenopathy or metastatic disease. There were areas of hypermetabolism involving the left oblique abdominal muscles and the anorectal junction.  PFTs on 11/18/2017 revealed an FEV1 of 1.22 liters (51%). DLCO adj was 6.8 mL/mmHg/min (32%).  ENB done on 12/07/2017. Cytology was negative for malignancy. Pathologydemonstrated organizing pneumonia and chronic bronchitis. Pathologist commented that organizing pneumonia spanned about 2 mm in one fragment. Cases of focal organizing pneumonia with hypermetabolism on FDG-PET have been described, but changes of this type can also be adjacent to a neoplasm. Patient prescribed a daily dose of Prednisone 10 mg, with re-imaging planned in 6-8 weeks.   Chest CT on 02/06/2018 revealed the spiculated 2.2 cm right middle lobe lesion was almost completely resolved with only a residual 4 mm irregular nodule identified at this location on a background of architectural distortion/evolving scar. There was a 5 mm right parahilar nodule stable since 11/16/2017(not present on 07/07/2016). There were tiny nodules in both upper lobes suggesting inhalation etiology.  Chest CT on 06/27/2019 revealed the nodule of the right middle lobe had almost  completely resolved, with residual linear scarring. There was persistent paramedian consolidation of the medial right middle lobe and lingula, generally in keeping with atypical infection, particularly atypical mycobacterium. There had been interval increase in size of a small nodule of the right middle lobe, now measuring 4 mm (previously 2 mm).   There was new 6 mm ground-glass opacity of the lateral left upper lobe measuring.  Although nonspecific, these were likely infectious or inflammatory in nature.  Attention on follow-up was receommended. There was unchanged tiny biapical pulmonary nodules, benign sequelae of nonspecific infection or inflammation, possibly related cigarette smoking, atypical infection, or other inhalational, inflammatory lung disease. There was hepatic steatosis.  Chest CT on 12/26/2019 revealed stable sub-cm bilateral pulmonary nodules, consistent with benign etiology. There was stable paramedian consolidation in the right middle lobe and lingula. There was a new airspace disease in inferior right middle lobe, with a few adjacent subcentimeter nodules. Overall, above findings were consistent with waxing and waning atypical infectious process, such as MAI.  She began prednisone10 mg on 12/07/2017 (discontinued 03/2018).She received Rituxanweekly x 4 (last dose 06/16/2018).  Entecavir was discontinued on 07/06/2019.  She has diarrhea. 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 She received azithromycin x 3 days. She received ciprofloxacin x 10 days without improvement. She has seen ID in [place]. She began Bactrim on 03/04/2018 (discontinued on 03/11/2018) secondary to increase in creatine.  She has renal insufficiency. Creatininehas been followed: 1.17 on 11/05/2017, 1.85 on 11/09/2017, 1.38 on 11/12/2017, 1.74 on 11/15/2017, 1.66 on 11/17/2017, and 1.48 on 11/23/2017. Urinalysis on 11/15/2017 revealed revealed no hemoglobin, bilirubin  or active sediment.  Liver function tests increased on 02/15/2019.  Work-up on 02/16/2019 revealed hepatitis B E antibody positive.  Negative studies included: hepatitis A total antibody, hepatitis A IgM, hepatitis B surface antigen, hepatitis B E antigen.  Hepatitis B DNA was not detected.  Folate was 80.5 and vitamin B12 was 443.  She has received her vaccinations:  She has completed the PCV13, PPSV23, and HiB.  She received Menvio (quadrivalent meningoccal vaccine) and Bexero (univalent serogroup B meningoccal vaccine) on 07/06/2019.  She notes that she tested positive for COVID-19 on 08/21/2019.  She received the Moderna COVID-19 vaccine on 11/10/2019 and 12/08/2019.   Symptomatically, she has been fatigued x 1 month.  She denies any new medications or herbal products except doxycycline.  She denies any bleeding or change in urine color.  Exam is stable.  Hemoglobin is 8.4.   Plan: 1.   Labs today: CBC with diff, retic, CMP, LDH, ferritin, iron studies, B12, folate, Coombs, cold agglutinin. 2.   Peripheral smear for path review. 3.Hemolytic anemia Clinically, she notes fatigue x 1 month.  She has a history of warm and cold autoantibodies.       Hematocrit35.4. Hemoglobin 11.4.LDH 174 (normal) on 12/28/2019.  Hematocrit 27.1.  Hemoglobin   8.6.           (nephrology) on 05/23/2020. Hematocrit 27.6.  Hemoglobin   8.4.  LDH 204 (98-192) on 06/12/2020.  Await work-up.  Discuss consideration of steroids +/- Rituxan.  If active hemolysis documented, contact Dr Gabriel Rainwater at [place] [facility] [phone]). 4.Hepatitis B Patient was previously exposed to hepatitis B. She was on entecavir x 1 year (stopped 07/06/2019). Patient did not complete her meningococcal vaccines.  LFTs are normal.  Patient will need to reinitiate entecavir if Rituxan re-initiated. 5.B12 deficiency B12 was 443 on 02/15/2019 and 885 today.  Folate was 80.5 on 02/15/2019 and> 100  today.   Will need to confirm how often patient is taking folic acid.  Folate levels will need to be maintained if active hemolysis documented. 6. Right middle lobe nodule Chest CT on 06/27/2019 had increased from 2 to 4 mm (unclear significance).   LUL 6 mm ground glass opacity (new).  Chest CT on 12/26/2019 revealed stable sub-cm bilateral pulmonary nodules c/w benign etiology.    There was a new airspace disease in inferior right middle lobe, with a few adjacent subcentimeter nodules.    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  Follow-up with Dr Mortimer Fries.   7.   RN to call patient with results. 8.   RTC in 1 week for review of work-up and discussion regarding direction of therapy.   Addendum:  Labs today revealed ferritin 434, iron saturation 32% and a TIBC 272.  Retic 7.1%.  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  Discussed with blood bank lab on 06/16/2020.  No titers available.  Appears to both cold and warm auto antibodies.  Attempted to contact patient x 3 and daughter x 2.  Rx sent in for prednisone 80 mg/day (1 mg/kg).  Additional labs to be checked this week.   I discussed the assessment and treatment plan with the patient.  The patient was provided an opportunity to ask questions and all were answered.  The patient agreed with the plan and demonstrated an understanding of the instructions.  The patient was advised to call back or seek an in person evaluation if the symptoms worsen or if the condition fails to improve as anticipated.   Nolon Stalls, MD, PhD  06/12/2020, 12:05 PM  I, Mirian Mo Tufford, am acting as Education administrator for [employer]. Mike Gip, MD, PhD.  I, Allisha Harter C. Mike Gip, MD, have reviewed the above documentation for accuracy and completeness, and I agree with the above.

## 2020-06-12 ENCOUNTER — Inpatient Hospital Stay: Payer: BC Managed Care – PPO | Attending: Hematology and Oncology | Admitting: Hematology and Oncology

## 2020-06-12 ENCOUNTER — Other Ambulatory Visit: Payer: Self-pay | Admitting: Hematology and Oncology

## 2020-06-12 ENCOUNTER — Other Ambulatory Visit: Payer: Self-pay

## 2020-06-12 ENCOUNTER — Encounter: Payer: Self-pay | Admitting: Hematology and Oncology

## 2020-06-12 ENCOUNTER — Inpatient Hospital Stay: Payer: BC Managed Care – PPO

## 2020-06-12 VITALS — BP 141/57 | HR 73 | Temp 97.3°F | Resp 18 | Ht 63.0 in | Wt 176.9 lb

## 2020-06-12 DIAGNOSIS — N289 Disorder of kidney and ureter, unspecified: Secondary | ICD-10-CM | POA: Diagnosis not present

## 2020-06-12 DIAGNOSIS — Z8249 Family history of ischemic heart disease and other diseases of the circulatory system: Secondary | ICD-10-CM | POA: Diagnosis not present

## 2020-06-12 DIAGNOSIS — Z87891 Personal history of nicotine dependence: Secondary | ICD-10-CM | POA: Insufficient documentation

## 2020-06-12 DIAGNOSIS — D649 Anemia, unspecified: Secondary | ICD-10-CM | POA: Diagnosis not present

## 2020-06-12 DIAGNOSIS — D591 Autoimmune hemolytic anemia, unspecified: Secondary | ICD-10-CM

## 2020-06-12 DIAGNOSIS — E790 Hyperuricemia without signs of inflammatory arthritis and tophaceous disease: Secondary | ICD-10-CM

## 2020-06-12 DIAGNOSIS — E538 Deficiency of other specified B group vitamins: Secondary | ICD-10-CM | POA: Diagnosis not present

## 2020-06-12 DIAGNOSIS — R768 Other specified abnormal immunological findings in serum: Secondary | ICD-10-CM

## 2020-06-12 DIAGNOSIS — Z79899 Other long term (current) drug therapy: Secondary | ICD-10-CM | POA: Insufficient documentation

## 2020-06-12 DIAGNOSIS — D5911 Warm autoimmune hemolytic anemia: Secondary | ICD-10-CM | POA: Insufficient documentation

## 2020-06-12 DIAGNOSIS — D696 Thrombocytopenia, unspecified: Secondary | ICD-10-CM

## 2020-06-12 DIAGNOSIS — Z7902 Long term (current) use of antithrombotics/antiplatelets: Secondary | ICD-10-CM | POA: Diagnosis not present

## 2020-06-12 DIAGNOSIS — Z833 Family history of diabetes mellitus: Secondary | ICD-10-CM | POA: Insufficient documentation

## 2020-06-12 DIAGNOSIS — R918 Other nonspecific abnormal finding of lung field: Secondary | ICD-10-CM | POA: Insufficient documentation

## 2020-06-12 DIAGNOSIS — B1910 Unspecified viral hepatitis B without hepatic coma: Secondary | ICD-10-CM | POA: Insufficient documentation

## 2020-06-12 DIAGNOSIS — D5912 Cold autoimmune hemolytic anemia: Secondary | ICD-10-CM

## 2020-06-12 LAB — DAT, POLYSPECIFIC AHG (ARMC ONLY): Polyspecific AHG test: POSITIVE

## 2020-06-12 LAB — SAMPLE TO BLOOD BANK

## 2020-06-12 LAB — RETICULOCYTES
Immature Retic Fract: 26.2 % — ABNORMAL HIGH (ref 2.3–15.9)
RBC.: 2.6 MIL/uL — ABNORMAL LOW (ref 3.87–5.11)
Retic Count, Absolute: 185.4 10*3/uL (ref 19.0–186.0)
Retic Ct Pct: 7.1 % — ABNORMAL HIGH (ref 0.4–3.1)

## 2020-06-12 LAB — COMPREHENSIVE METABOLIC PANEL
ALT: 14 U/L (ref 0–44)
AST: 17 U/L (ref 15–41)
Albumin: 4.3 g/dL (ref 3.5–5.0)
Alkaline Phosphatase: 65 U/L (ref 38–126)
Anion gap: 7 (ref 5–15)
BUN: 32 mg/dL — ABNORMAL HIGH (ref 8–23)
CO2: 25 mmol/L (ref 22–32)
Calcium: 8.8 mg/dL — ABNORMAL LOW (ref 8.9–10.3)
Chloride: 105 mmol/L (ref 98–111)
Creatinine, Ser: 1.17 mg/dL — ABNORMAL HIGH (ref 0.44–1.00)
GFR, Estimated: 50 mL/min — ABNORMAL LOW (ref >60)
Glucose, Bld: 94 mg/dL (ref 70–99)
Potassium: 4.5 mmol/L (ref 3.5–5.1)
Sodium: 137 mmol/L (ref 135–145)
Total Bilirubin: 0.7 mg/dL (ref 0.3–1.2)
Total Protein: 7.5 g/dL (ref 6.5–8.1)

## 2020-06-12 LAB — CBC WITH DIFFERENTIAL/PLATELET
Abs Immature Granulocytes: 0.01 10*3/uL (ref 0.00–0.07)
Basophils Absolute: 0.1 10*3/uL (ref 0.0–0.1)
Basophils Relative: 2 %
Eosinophils Absolute: 0 10*3/uL (ref 0.0–0.5)
Eosinophils Relative: 1 %
HCT: 27.6 % — ABNORMAL LOW (ref 36.0–46.0)
Hemoglobin: 8.4 g/dL — ABNORMAL LOW (ref 12.0–15.0)
Immature Granulocytes: 0 %
Lymphocytes Relative: 23 %
Lymphs Abs: 0.8 10*3/uL (ref 0.7–4.0)
MCH: 31.8 pg (ref 26.0–34.0)
MCHC: 30.4 g/dL (ref 30.0–36.0)
MCV: 104.5 fL — ABNORMAL HIGH (ref 80.0–100.0)
Monocytes Absolute: 0.2 10*3/uL (ref 0.1–1.0)
Monocytes Relative: 7 %
Neutro Abs: 2.3 10*3/uL (ref 1.7–7.7)
Neutrophils Relative %: 67 %
Platelets: 138 10*3/uL — ABNORMAL LOW (ref 150–400)
RBC: 2.64 MIL/uL — ABNORMAL LOW (ref 3.87–5.11)
RDW: 14.8 % (ref 11.5–15.5)
WBC: 3.4 10*3/uL — ABNORMAL LOW (ref 4.0–10.5)
nRBC: 0 % (ref 0.0–0.2)

## 2020-06-12 LAB — FOLATE: Folate: >100 ng/mL (ref >5.9)

## 2020-06-12 LAB — VITAMIN B12: Vitamin B-12: 885 pg/mL (ref 180–914)

## 2020-06-12 LAB — IRON AND TIBC
Iron: 86 ug/dL (ref 28–170)
Saturation Ratios: 32 % — ABNORMAL HIGH (ref 10.4–31.8)
TIBC: 272 ug/dL (ref 250–450)
UIBC: 186 ug/dL

## 2020-06-12 LAB — LACTATE DEHYDROGENASE: LDH: 204 U/L — ABNORMAL HIGH (ref 98–192)

## 2020-06-12 LAB — FERRITIN: Ferritin: 434 ng/mL — ABNORMAL HIGH (ref 11–307)

## 2020-06-12 LAB — PATHOLOGIST SMEAR REVIEW

## 2020-06-12 NOTE — Progress Notes (Signed)
No new changes noted today 

## 2020-06-13 LAB — COLD AGGLUTININ TITER: Cold Agglutinin Titer: NEGATIVE

## 2020-06-14 ENCOUNTER — Telehealth: Payer: Self-pay

## 2020-06-14 NOTE — Telephone Encounter (Signed)
Spoke with the patient to inform her of her lab result. the patient was understanding and agreeable.

## 2020-06-16 ENCOUNTER — Other Ambulatory Visit: Payer: Self-pay | Admitting: Hematology and Oncology

## 2020-06-16 DIAGNOSIS — D589 Hereditary hemolytic anemia, unspecified: Secondary | ICD-10-CM

## 2020-06-16 DIAGNOSIS — D599 Acquired hemolytic anemia, unspecified: Secondary | ICD-10-CM

## 2020-06-16 MED ORDER — PREDNISONE 20 MG PO TABS: 80.0000 mg | ORAL_TABLET | Freq: Every day | ORAL | 1 refills | Status: DC

## 2020-06-18 NOTE — Progress Notes (Signed)
[facility]  [address], [address] [place], [place] [address] Phone: [phone]  Fax: [phone]   Clinic Day:  06/19/2020  Referring physician: Frazier Richards, MD  Chief Complaint: Tina Page is a 64 y.o. female with a right middle lobe mass, warm and cold autoimmune hemolytic anemia, and renal insufficiency who is seen for review of recent anemia work-up and discussion regarding direction of therapy.  HPI: The patient was last seen in the hematology clinic on 06/12/2020. At that time, she had been fatigued x 1 month.  She denied any new medications or herbal products except doxycycline.  She denied any bleeding or change in urine color.  Exam was stable.   Labs revealed a hematocrit of 27.6, hemoglobin 8.4, MCV 104.5, platelets 138,000, WBC 3,400 (ANC 2,300).  Creatinine was 1.17 (CrCl 50 ml/min). Calcium was 8.8. Ferritin was 434 with an iron saturation of 32% and a TIBC of 272.  Reticulocyte count was 7.1%. Vitamin B12 was 885 and folate was >100.0. LDH was 204.   Peripheral smear revealed macrocytic anemia with mild thrombocytopenia. The morphology of RBCs, WBCs, and platelets were within normal limits. There was no evidence of circulating blasts or schistocytes.  Coombs polyspecific AGH test was positive.  I discussed with blood bank labs on 06/16/2020.  No titers available.  Laboratory personnel noted the appearance of both cold and warm autoantibodies.  I attempted to contact patient x 3 and daughter x 2.  Rx was sent in for prednisone 80 mg/day (1 mg/kg).  Laboratory subsequently confirmed only warm autoantibodies.  The patient returned the call on 06/16/2020 and was instructed to begin prednisone with GI prophylaxis.  During the interim, she has been "ok".  She took her prednisone this morning with breakfast. She denies mouth tenderness or thrush.   Past Medical History:  Diagnosis Date  . Anemia   . Atherosclerosis of native arteries of  extremity with intermittent claudication ([place]) 07/20/2016  . COPD (chronic obstructive pulmonary disease) ([place])   . 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 Elevated liver function tests 11/03/2017  . GERD (gastroesophageal reflux disease)   . Heme positive stool 01/29/2015  . Hepatitis C 07/12/2016  . HLD (hyperlipidemia)   . Hypertension   . Hypothyroidism   . 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 Thrombocytopenia ([place]) 10/04/2016    Past Surgical History:  Procedure Laterality Date  . ELECTROMAGNETIC NAVIGATION BROCHOSCOPY N/A 12/07/2017   Procedure: ELECTROMAGNETIC NAVIGATION BRONCHOSCOPY;  Surgeon: Flora Lipps, MD;  Location: [facility] ORS;  Service: Cardiopulmonary;  Laterality: N/A;  . ESOPHAGOGASTRODUODENOSCOPY (EGD) WITH PROPOFOL N/A 07/08/2016   Procedure: ESOPHAGOGASTRODUODENOSCOPY (EGD) WITH PROPOFOL;  Surgeon: Manya Silvas, MD;  Location: [facility] ENDOSCOPY;  Service: Endoscopy;  Laterality: N/A;  . KNEE SURGERY Right    repair of acl tear  . PERIPHERAL VASCULAR CATHETERIZATION N/A 07/10/2016   Procedure: Lower Extremity Angiography;  Surgeon: Katha Cabal, MD;  Location: [place] CV LAB;  Service: Cardiovascular;  Laterality: N/A;  . PERIPHERAL VASCULAR CATHETERIZATION N/A 07/10/2016   Procedure: Abdominal Aortogram w/Lower Extremity;  Surgeon: Katha Cabal, MD;  Location: [place] CV LAB;  Service: Cardiovascular;  Laterality: N/A;  . PERIPHERAL VASCULAR CATHETERIZATION  07/10/2016   Procedure: Lower Extremity Intervention;  Surgeon: Katha Cabal, MD;  Location: [place] CV LAB;  Service: Cardiovascular;;    Family History  Problem Relation Age of Onset  . Diabetes Mother   .  Hypertension Mother   . Diabetes Maternal Grandfather   . Hypertension Maternal Grandfather   . Breast cancer Neg Hx     Social History:  reports that she quit smoking about 3 years ago. Her smoking use included cigarettes. She has a  22.50 pack-year smoking history. She has never used smokeless tobacco. She reports that she does not drink alcohol and does not use drugs. She has a 21 pack year smoking history (1/2 pack/day from age [age]). The patient works at [employer] She is exposed to cold temperatures. She works 3rd shift. She has a daughter, Tina Page and a daughter named Tina Page. She lives in [place]. The patient is alone today.  Allergies:  Allergies  Allergen Reactions  . Codeine Anaphylaxis  . Ciprofloxacin Swelling    Facial swelling following single oral dose.     Current Medications: Current Outpatient Medications  Medication Sig Dispense Refill  . albuterol (PROVENTIL HFA;VENTOLIN HFA) 108 (90 Base) MCG/ACT inhaler Inhale 2 puffs into the lungs every 6 (six) hours as needed for wheezing or shortness of breath.     . allopurinol (ZYLOPRIM) 100 MG tablet TAKE 1 TABLET BY MOUTH EVERY DAY 30 tablet 5  . clopidogrel (PLAVIX) 75 MG tablet Take 1 tablet (75 mg total) by mouth daily. 30 tablet 5  . cyanocobalamin 500 MCG tablet Take 500 mcg by mouth daily.    . folic acid (FOLVITE) 1 MG tablet TAKE 1 TABLET (1 MG TOTAL) BY MOUTH DAILY. 90 tablet 1  . hydrochlorothiazide (HYDRODIURIL) 25 MG tablet Take 25 mg by mouth daily.    [name] Kitchen levothyroxine (SYNTHROID, LEVOTHROID) 75 MCG tablet Take 75 mcg by mouth daily before breakfast.     . lisinopril (PRINIVIL,ZESTRIL) 20 MG tablet Take 20 mg by mouth daily.     [name] Kitchen omeprazole (PRILOSEC) 20 MG capsule Take 1 capsule (20 mg total) by mouth daily. 60 capsule 0  . potassium chloride (KLOR-CON) 20 MEQ packet Take 20 mEq by mouth daily.    . pravastatin (PRAVACHOL) 40 MG tablet Take 40 mg by mouth every evening.    . predniSONE (DELTASONE) 20 MG tablet Take 4 tablets (80 mg total) by mouth daily with breakfast. 40 tablet 1  . Tiotropium Bromide Monohydrate (SPIRIVA RESPIMAT) 2.5 MCG/ACT AERS Inhale 2 puffs into the lungs daily for 1 day. 1 each 0   No current  facility-administered medications for this visit.    Review of Systems  Constitutional: Negative for chills, diaphoresis, fever, malaise/fatigue and weight loss (stable).       Feels "ok".  HENT: Negative for congestion, ear discharge, ear pain, hearing loss, nosebleeds, sinus pain, sore throat and tinnitus.   Eyes: Negative for blurred vision.  Respiratory: Negative for cough, hemoptysis, sputum production and shortness of breath.   Cardiovascular: Negative for chest pain, palpitations and leg swelling.  Gastrointestinal: Negative for abdominal pain, blood in stool, constipation, diarrhea, heartburn, melena, nausea and vomiting.  Genitourinary: Negative for dysuria, frequency, hematuria and urgency.  Musculoskeletal: Negative for back pain, joint pain, myalgias and neck pain.  Skin: Negative for itching and rash.  Neurological: Negative for dizziness, tingling, sensory change, weakness and headaches.  Endo/Heme/Allergies: Does not bruise/bleed easily.  Psychiatric/Behavioral: Negative for depression and memory loss. The patient is not nervous/anxious and does not have insomnia.   All other systems reviewed and are negative.  Performance status (ECOG): 0  Vital Signs Blood pressure (!) 158/72, pulse 72, temperature (!) 97.3 F (36.3 C), temperature source Tympanic, resp. rate  18, weight 176 lb 5.9 oz (80 kg), SpO2 100 %.  Physical Exam Vitals and nursing note reviewed.  Constitutional:      General: She is not in acute distress.    Appearance: She is not diaphoretic.  HENT:     Head: Normocephalic and atraumatic.     Comments: Long gray hair.    Mouth/Throat:     Mouth: Mucous membranes are moist.     Pharynx: Oropharynx is clear. No oropharyngeal exudate or posterior oropharyngeal erythema.  Eyes:     General: No scleral icterus.    Extraocular Movements: Extraocular movements intact.     Conjunctiva/sclera: Conjunctivae normal.     Pupils: Pupils are equal, round, and  reactive to light.     Comments: Dark rimmed glasses.  Cardiovascular:     Rate and Rhythm: Normal rate and regular rhythm.     Heart sounds: Normal heart sounds. No murmur heard.   Pulmonary:     Effort: Pulmonary effort is normal. No respiratory distress.     Breath sounds: Normal breath sounds. No wheezing or rales.  Chest:     Chest wall: No tenderness.  Abdominal:     General: Bowel sounds are normal. There is no distension.     Palpations: Abdomen is soft. There is no hepatomegaly, splenomegaly or mass.     Tenderness: There is no abdominal tenderness. There is no guarding or rebound.  Musculoskeletal:        General: No swelling or tenderness. Normal range of motion.     Cervical back: Normal range of motion and neck supple.  Lymphadenopathy:     Head:     Right side of head: No preauricular, posterior auricular or occipital adenopathy.     Left side of head: No preauricular, posterior auricular or occipital adenopathy.     Cervical: No cervical adenopathy.     Upper Body:     Right upper body: No supraclavicular or axillary adenopathy.     Left upper body: No supraclavicular or axillary adenopathy.     Lower Body: No right inguinal adenopathy. No left inguinal adenopathy.  Skin:    General: Skin is warm and dry.     Coloration: Skin is not jaundiced.  Neurological:     Mental Status: She is alert and oriented to person, place, and time.  Psychiatric:        Behavior: Behavior normal.        Thought Content: Thought content normal.        Judgment: Judgment normal.    Appointment on 06/19/2020  Component Date Value Ref Range Status  . WBC 06/19/2020 6.7  4.0 - 10.5 K/uL Final  . RBC 06/19/2020 2.91* 3.87 - 5.11 MIL/uL Final  . Hemoglobin 06/19/2020 9.2* 12.0 - 15.0 g/dL Final  . HCT 06/19/2020 30.4* 36 - 46 % Final  . MCV 06/19/2020 104.5* 80.0 - 100.0 fL Final  . MCH 06/19/2020 31.6  26.0 - 34.0 pg Final  . MCHC 06/19/2020 30.3  30.0 - 36.0 g/dL Final  . RDW  06/19/2020 14.8  11.5 - 15.5 % Final  . Platelets 06/19/2020 166  150 - 400 K/uL Final  . nRBC 06/19/2020 0.3* 0.0 - 0.2 % Final  . Neutrophils Relative % 06/19/2020 87  % Final  . Neutro Abs 06/19/2020 5.9  1.7 - 7.7 K/uL Final  . Lymphocytes Relative 06/19/2020 9  % Final  . Lymphs Abs 06/19/2020 0.6* 0.7 - 4.0 K/uL Final  . Monocytes  Relative 06/19/2020 2  % Final  . Monocytes Absolute 06/19/2020 0.1  0.1 - 1.0 K/uL Final  . Eosinophils Relative 06/19/2020 0  % Final  . Eosinophils Absolute 06/19/2020 0.0  0.0 - 0.5 K/uL Final  . Basophils Relative 06/19/2020 0  % Final  . Basophils Absolute 06/19/2020 0.0  0.0 - 0.1 K/uL Final  . Immature Granulocytes 06/19/2020 2  % Final  . 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 One week prior to presentation, she felt like she was coming down with something. She denied any fever, runny nose, sore throat or cough. She had some diarrhea. She denied any new medications or herbal products.  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 Reticulocyte count was 11.3%. Ferritin was 562. Iron studies revealed a saturation of 20% and a TIBC of 214 (low). B12 was 231 (low normal). Folate was 42. Peripheral smear revealed rouleaux formation. Labs on 06/21/2019revealed normal flow cytometry, SPEP, and immunoglobulins.  Additional testingincluded the following+ studies: hepatitis C antibody,hepatitis B core antibody,CMV IgM, and EBV VCA (IgM and IgG). Hepatitis B by PCRwas negative. Hepatitis C RNAwas negative. Mycoplasma pneumonia IgM was negative. Reticulocyte count was 11.3% (high) indicating appropriate marrow response. C3 and C4 were normal. Cold agglutinin titer  was negative x 2. Negative studies included: ANA, hepatitis B surface antigen, SPEP, and free light chain ratio. 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 MMA was initially 330 (normal). Repeat MMA was elevated on 08/01/2016 confirming B12 deficiency. Hepatitis B surface antibody was positive and hepatitis B surface antigen was negative on 08/06/2016.  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 There was no adenopathy. Spleen was normal. Abdominal ultrasoundon 04/15/2017 revealed a normal spleen and sludge in the gallbladder. The liver was echogenic consistent fatty infiltration and/or hepatocellular disease.  She underwent PTCA and stent placementin right and left common iliac arteries and left external iliac artery on 07/10/2016.She is on Plavix.EGD on 03/19/2015 revealed gastritis in the body and antrum. Colonoscopyon 03/19/2015 revealed one hyperplastic polyp. EGDon 07/08/2016 was normal. No evidence of bleeding.  She has received6 units of warmed PRBCsto date (last on 08/01/2016).If Rituxan is needed, she requires entecavir 0.5 mg q day beginning 2 weeks prior to Rituxan. In addition, hepatitis B viral level and LFTs should be checked monthly.  She began steroids(1 mg/kg) on 08/01/2016. She stopped prednisoneon 03/12/2017. She is onfolic acid1 mg a day.   She hasB12 deficiency. B12 was 231 on 07/09/2016. She is on oral B12. B12 and folate were normal on 03/09/2017 and 10/12/2017.  She developed peri-oral and intranasalherpes simplex-1. She was treated with valacyclovir and doxycycline on 10/19/2016. She developed a transient elevated alkaline phosphataseon 10/19/2016 which subsequently normalized.  She was documented to have awarm  autoantibodyon 10/12/2017. LDH and bilirubin were normal. Hematocrit improved. She began prednisone10 mg on 12/07/2017 (discontinued 03/2018).She received Rituxanweekly x 4 (last dose 06/16/2018).  Entecavir was discontinued on 07/06/2019.  She developedflu like symptomson 10/26/2017. Symptoms included cough, myalgias, and fever (tmax unknown). She was prescribed Mucinex, Tamiflu, and amoxicillin. She only took amoxicillin x 5 days.  She developedincreased liver function testson 11/02/2017. CMV IgG was positive. EBV VCA IgG, NA IgG, early antigen antibody IgG were elevated. EBV VCA IgM was <36. Testing was c/w a convalescence/past infection or reactivated infection. LFTs normalized on 11/15/2017. Smooth muscle antibodywas 39 (high) on 11/10/2017 and 34 (high) on 11/30/2017.   Labs on 06/12/2020 revealed recurrent anemia.  Hematocrit was 27.6, hemoglobin 8.4, MCV 104.5, platelets 138,000, WBC 3,400 (ANC 2,300). Creatinine was 1.17 (CrCl 50 ml/min).  Normal labs included ferritin (434), iron studies, B12 (885), folate (> 100).  Coombs polyspecific AGH test was positive (warm autoantibody).  Reticulocyte count was 7.1%.  LDH was 204 (98-192).  Peripheral smear revealed macrocytic anemia with mild thrombocytopenia. The morphology of RBCs, WBCs, and platelets were within normal limits. There was no evidence of circulating blasts or schistocytes.  She began prednisone 1 mg/kg (80 mg) on 06/16/2020.   Abdomen and pelvic CTon 11/10/2017 revealed a 2.2 cm spiculated density in right middle lobe concerning for malignancy. Chest CTon 11/16/2017 revealed a 2.2 x 2.0 x 2.4 spiculated RIGHT middle lobe mass. There was tiny nonspecific upper lobe pulmonary nodules greater on RIGHT, largest 3 mm, of uncertain etiology. There was additional 8 x 8 mm opacity in the posterior sulcus of the RIGHT lower lobe.  PET scanon 11/22/2017 revealed a 2 cm hypermetabolic right middle lobe lung mass  (SUV 3.4) consistent with primary lung neoplasm. There were no findings for mediastinal/hilar lymphadenopathy or metastatic disease. There were areas of hypermetabolism involving the left oblique abdominal muscles and the anorectal junction.  PFTs on 11/18/2017 revealed an FEV1 of 1.22 liters (51%). DLCO adj was 6.8 mL/mmHg/min (32%).  ENB done on 12/07/2017. Cytology was negative for malignancy. Pathologydemonstrated organizing pneumonia and chronic bronchitis. Pathologist commented that organizing pneumonia spanned about 2 mm in one fragment. Cases of focal organizing pneumonia with hypermetabolism on FDG-PET have been described, but changes of this type can also be adjacent to a neoplasm. Patient prescribed a daily dose of Prednisone 10 mg, with re-imaging planned in 6-8 weeks.   Chest CT on 02/06/2018 revealed the spiculated 2.2 cm right middle lobe lesion was almost completely resolved with only a residual 4 mm irregular nodule identified at this location on a background of architectural distortion/evolving scar. There was a 5 mm right parahilar nodule stable since 11/16/2017(not present on 07/07/2016). There were tiny nodules in both upper lobes suggesting inhalation etiology.  Chest CT on 06/27/2019 revealed the nodule of the right middle lobe had almost completely resolved, with residual linear scarring. There was persistent paramedian consolidation of the medial right middle lobe and lingula, generally in keeping with atypical infection, particularly atypical mycobacterium. There had been interval increase in size of a small nodule of the right middle lobe, now measuring 4 mm (previously 2 mm).   There was new 6 mm ground-glass opacity of the lateral left upper lobe measuring.  Although nonspecific, these were likely infectious or inflammatory in nature.  Attention on follow-up was receommended. There was unchanged tiny biapical pulmonary nodules, benign sequelae of nonspecific infection  or inflammation, possibly related cigarette smoking, atypical infection, or other inhalational, inflammatory lung disease. There was hepatic steatosis.  Chest CT on 12/26/2019 revealed stable sub-cm bilateral pulmonary nodules, consistent with benign etiology. There was stable paramedian consolidation in the right middle lobe and lingula. There was a new airspace disease in inferior right middle lobe, with a few adjacent subcentimeter nodules. Overall, above findings were consistent with waxing and waning atypical infectious process, such  as MAI.   She has diarrhea. 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 She received azithromycin x 3 days. She received ciprofloxacin x 10 days without improvement. She has seen ID in [place]. She began Bactrim on 03/04/2018 (discontinued on 03/11/2018) secondary to increase in creatine.  She has renal insufficiency. Creatininehas been followed: 1.17 on 11/05/2017, 1.85 on 11/09/2017, 1.38 on 11/12/2017, 1.74 on 11/15/2017, 1.66 on 11/17/2017, and 1.48 on 11/23/2017. Urinalysis on 11/15/2017 revealed revealed no hemoglobin, bilirubin or active sediment.  Liver function tests increased on 02/15/2019.  Work-up on 02/16/2019 revealed hepatitis B E antibody positive.  Negative studies included: hepatitis A total antibody, hepatitis A IgM, hepatitis B surface antigen, hepatitis B E antigen.  Hepatitis B DNA was not detected.  Folate was 80.5 and vitamin B12 was 443.  She has received her vaccinations:  She has completed the PCV13, PPSV23, and HiB.  She received Menvio (quadrivalent meningoccal vaccine) and Bexero (univalent serogroup B meningoccal vaccine) on 07/06/2019.  She notes that she tested positive for COVID-19 on 08/21/2019.  She received the Moderna COVID-19 vaccine on 11/10/2019 and 12/08/2019.   Symptomatically, she feels "fine".  She denies any bleeding.  Exam reveals no adenopathy or  hepatosplenomegaly.  Plan: 1.   Labs today:  CBC with diff, haptoglobin, LDH, retic, hold tube. 2.Warm autoimmune hemolytic anemia Clinically, she has been fatigued for 1 month.  She has a history of warm and cold autoantibodies.       Hematocrit35.4. Hemoglobin 11.4.LDH 174 (normal) on 12/28/2019.  Hematocrit 27.1.  Hemoglobin   8.6.           (nephrology) on 05/23/2020. Hematocrit 27.6.  Hemoglobin   8.4.  LDH 204 (98-192) on 06/12/2020.   Work-up confirmed warm autoantibodies.  Hematocrit 30.4.  Hemoglobin   9.2.  LDH 164 (98-192) on 06/19/2020.  Patient began prednisone 1 mg/kg (80 mg) on 06/16/2020.   Decrease prednisone to 60 mg a day.  Discuss consideration of Rituxan (1 gm x 2 or 375 mg/m2 weekly x 4).   Potential side effects reviewed including re-activation of hepatitis B.   Discuss entecavir (contact ID at [facility] secondary to cost issues).  Contact Dr Floreen Comber Key at [facility] [phone]). 3.Hepatitis B Patient was previously exposed to hepatitis B. She was on entecavir x 1 year when she previously received Rituxan (stopped 07/06/2019). Patient did not complete her meningococcal vaccines.  LFTS were normal on 06/12/2020.  Contact Dr Lindwood Qua [phone]) re: entecavir. 4.B12 deficiency B12 was 443 on 02/15/2019 and 885 on 06/12/2020.  Folate was 80.5 on 02/15/2019 and >100 on 06/12/2020.   Anticipate decreasing folate supplementation.  Folate levels will need to be maintained as active hemolysis has been documented. 5. Right middle lobe nodule Chest CT on 06/27/2019 had increased from 2 to 4 mm (unclear significance).   LUL 6 mm ground glass opacity (new).  Chest CT on 12/26/2019 revealed stable sub-cm bilateral pulmonary nodules c/w benign etiology.    There was a new airspace disease in inferior right middle lobe, with a few adjacent subcentimeter nodules.    Findings felt c/w  waxing and waning atypical infectious process, such as MAI.  Follow-up with Dr Mortimer Fries.   6.   Preauth Rituxan. 7.   RTC on 06/24/2020 for labs (CBC) and ongoing prednisone taper. 8.   RTC on 06/28/2020 for MD assessment, labs (CBC with diff), and prednisone taper.  I discussed the assessment and treatment plan with the patient.  The patient  was provided an opportunity to ask questions and all were answered.  The patient agreed with the plan and demonstrated an understanding of the instructions.  The patient was advised to call back or seek an in person evaluation if the symptoms worsen or if the condition fails to improve as anticipated.  I provided 15 minutes of face-to-face time during this this encounter and > 50% was spent counseling as documented under my assessment and plan.  An additional 20 minutes were spent reviewing her chart (Epic and Care Everywhere) including notes, labs, and imaging studies as well as contacting patient's physicians at [facility] (Dr Floreen Comber Key and Dr Zonia Kief) and writing Rituxan orders.    Nolon Stalls, MD, PhD  06/19/2020, 3:39 PM  I, Mirian Mo Tufford, am acting as Education administrator for [employer]. Mike Gip, MD, PhD.  I, Marilyn Wing C. Mike Gip, MD, have reviewed the above documentation for accuracy and completeness, and I agree with the above.

## 2020-06-19 ENCOUNTER — Encounter: Payer: Self-pay | Admitting: Hematology and Oncology

## 2020-06-19 ENCOUNTER — Other Ambulatory Visit: Payer: Self-pay

## 2020-06-19 ENCOUNTER — Inpatient Hospital Stay (HOSPITAL_BASED_OUTPATIENT_CLINIC_OR_DEPARTMENT_OTHER): Payer: BC Managed Care – PPO | Admitting: Hematology and Oncology

## 2020-06-19 ENCOUNTER — Inpatient Hospital Stay: Payer: BC Managed Care – PPO

## 2020-06-19 VITALS — BP 158/72 | HR 72 | Temp 97.3°F | Resp 18 | Wt 176.4 lb

## 2020-06-19 DIAGNOSIS — E538 Deficiency of other specified B group vitamins: Secondary | ICD-10-CM

## 2020-06-19 DIAGNOSIS — Z7189 Other specified counseling: Secondary | ICD-10-CM

## 2020-06-19 DIAGNOSIS — D589 Hereditary hemolytic anemia, unspecified: Secondary | ICD-10-CM

## 2020-06-19 DIAGNOSIS — D591 Autoimmune hemolytic anemia, unspecified: Secondary | ICD-10-CM | POA: Diagnosis not present

## 2020-06-19 DIAGNOSIS — R768 Other specified abnormal immunological findings in serum: Secondary | ICD-10-CM | POA: Diagnosis not present

## 2020-06-19 DIAGNOSIS — D5911 Warm autoimmune hemolytic anemia: Secondary | ICD-10-CM | POA: Diagnosis not present

## 2020-06-19 LAB — CBC WITH DIFFERENTIAL/PLATELET
Abs Immature Granulocytes: 0.12 10*3/uL — ABNORMAL HIGH (ref 0.00–0.07)
Basophils Absolute: 0 10*3/uL (ref 0.0–0.1)
Basophils Relative: 0 %
Eosinophils Absolute: 0 10*3/uL (ref 0.0–0.5)
Eosinophils Relative: 0 %
HCT: 30.4 % — ABNORMAL LOW (ref 36.0–46.0)
Hemoglobin: 9.2 g/dL — ABNORMAL LOW (ref 12.0–15.0)
Immature Granulocytes: 2 %
Lymphocytes Relative: 9 %
Lymphs Abs: 0.6 10*3/uL — ABNORMAL LOW (ref 0.7–4.0)
MCH: 31.6 pg (ref 26.0–34.0)
MCHC: 30.3 g/dL (ref 30.0–36.0)
MCV: 104.5 fL — ABNORMAL HIGH (ref 80.0–100.0)
Monocytes Absolute: 0.1 10*3/uL (ref 0.1–1.0)
Monocytes Relative: 2 %
Neutro Abs: 5.9 10*3/uL (ref 1.7–7.7)
Neutrophils Relative %: 87 %
Platelets: 166 10*3/uL (ref 150–400)
RBC: 2.91 MIL/uL — ABNORMAL LOW (ref 3.87–5.11)
RDW: 14.8 % (ref 11.5–15.5)
WBC: 6.7 10*3/uL (ref 4.0–10.5)
nRBC: 0.3 % — ABNORMAL HIGH (ref 0.0–0.2)

## 2020-06-19 LAB — LACTATE DEHYDROGENASE: LDH: 164 U/L (ref 98–192)

## 2020-06-19 LAB — BASIC METABOLIC PANEL
Anion gap: 8 (ref 5–15)
BUN: 39 mg/dL — ABNORMAL HIGH (ref 8–23)
CO2: 25 mmol/L (ref 22–32)
Calcium: 9 mg/dL (ref 8.9–10.3)
Chloride: 105 mmol/L (ref 98–111)
Creatinine, Ser: 1.24 mg/dL — ABNORMAL HIGH (ref 0.44–1.00)
GFR, Estimated: 47 mL/min — ABNORMAL LOW (ref >60)
Glucose, Bld: 184 mg/dL — ABNORMAL HIGH (ref 70–99)
Potassium: 4.3 mmol/L (ref 3.5–5.1)
Sodium: 138 mmol/L (ref 135–145)

## 2020-06-19 LAB — RETICULOCYTES
Immature Retic Fract: 25.4 % — ABNORMAL HIGH (ref 2.3–15.9)
RBC.: 2.79 MIL/uL — ABNORMAL LOW (ref 3.87–5.11)
Retic Count, Absolute: 210.2 10*3/uL — ABNORMAL HIGH (ref 19.0–186.0)
Retic Ct Pct: 7.6 % — ABNORMAL HIGH (ref 0.4–3.1)

## 2020-06-19 NOTE — Patient Instructions (Signed)
  Decrease prednisone to 60 mg a day.

## 2020-06-20 ENCOUNTER — Telehealth: Payer: Self-pay

## 2020-06-20 DIAGNOSIS — Z122 Encounter for screening for malignant neoplasm of respiratory organs: Secondary | ICD-10-CM

## 2020-06-20 DIAGNOSIS — Z87891 Personal history of nicotine dependence: Secondary | ICD-10-CM

## 2020-06-20 LAB — SAMPLE TO BLOOD BANK

## 2020-06-20 LAB — HAPTOGLOBIN: Haptoglobin: 18 mg/dL — ABNORMAL LOW (ref 37–355)

## 2020-06-20 LAB — PATHOLOGIST SMEAR REVIEW

## 2020-06-20 NOTE — Telephone Encounter (Signed)
Contacted patient for lung CT screening program based on referral from Derl Barrow, NP.  Patient is agreeable to program and shares Dr. Mike Gip also spoke to her about program.  CT scheduled for Wed, Nov 24 at 10:15.  Patient was given address to imaging center and phone number to [facility], lung navigator.  She has Film/video editor.  Patient is a former smoker who stopped smoking in 2017 due to stent surgery.  Before stopping smoking, she smoked 1 to 1 and 1/2 packs per day.  She started smoking at age 31.

## 2020-06-21 ENCOUNTER — Encounter: Payer: Self-pay | Admitting: *Deleted

## 2020-06-21 NOTE — Telephone Encounter (Signed)
Former smoker, quit 2017, 58.75 pack year

## 2020-06-21 NOTE — Addendum Note (Signed)
Addended by: Lieutenant Diego on: 06/21/2020 08:51 AM   Modules accepted: Orders

## 2020-06-24 ENCOUNTER — Inpatient Hospital Stay: Payer: BC Managed Care – PPO

## 2020-06-24 ENCOUNTER — Other Ambulatory Visit: Payer: Self-pay

## 2020-06-24 DIAGNOSIS — R768 Other specified abnormal immunological findings in serum: Secondary | ICD-10-CM

## 2020-06-24 DIAGNOSIS — D5911 Warm autoimmune hemolytic anemia: Secondary | ICD-10-CM | POA: Diagnosis not present

## 2020-06-24 DIAGNOSIS — E538 Deficiency of other specified B group vitamins: Secondary | ICD-10-CM

## 2020-06-24 DIAGNOSIS — D591 Autoimmune hemolytic anemia, unspecified: Secondary | ICD-10-CM

## 2020-06-24 LAB — CBC WITH DIFFERENTIAL/PLATELET
Abs Immature Granulocytes: 0.2 10*3/uL — ABNORMAL HIGH (ref 0.00–0.07)
Basophils Absolute: 0 10*3/uL (ref 0.0–0.1)
Basophils Relative: 0 %
Eosinophils Absolute: 0.1 10*3/uL (ref 0.0–0.5)
Eosinophils Relative: 1 %
HCT: 32.8 % — ABNORMAL LOW (ref 36.0–46.0)
Hemoglobin: 10 g/dL — ABNORMAL LOW (ref 12.0–15.0)
Immature Granulocytes: 2 %
Lymphocytes Relative: 16 %
Lymphs Abs: 1.5 10*3/uL (ref 0.7–4.0)
MCH: 32.4 pg (ref 26.0–34.0)
MCHC: 30.5 g/dL (ref 30.0–36.0)
MCV: 106.1 fL — ABNORMAL HIGH (ref 80.0–100.0)
Monocytes Absolute: 0.9 10*3/uL (ref 0.1–1.0)
Monocytes Relative: 9 %
Neutro Abs: 6.9 10*3/uL (ref 1.7–7.7)
Neutrophils Relative %: 72 %
Platelets: 164 10*3/uL (ref 150–400)
RBC: 3.09 MIL/uL — ABNORMAL LOW (ref 3.87–5.11)
RDW: 15.5 % (ref 11.5–15.5)
WBC: 9.6 10*3/uL (ref 4.0–10.5)
nRBC: 0.2 % (ref 0.0–0.2)

## 2020-06-25 MED ORDER — PREDNISONE 20 MG TABLET
Freq: Every day | 0.00000 days
Start: 2020-06-25 — End: 2020-10-15

## 2020-06-26 MED ORDER — ENTECAVIR 0.5 MG TABLET
ORAL_TABLET | ORAL | 2 refills | 30 days | Status: CP
Start: 2020-06-26 — End: 2020-09-24

## 2020-06-27 ENCOUNTER — Ambulatory Visit: Payer: BC Managed Care – PPO | Admitting: Hematology and Oncology

## 2020-06-27 ENCOUNTER — Other Ambulatory Visit: Payer: BC Managed Care – PPO

## 2020-06-27 NOTE — Progress Notes (Signed)
[facility]  [address] [place], [place] [address] Phone: [phone]  Fax: [phone]   Clinic Day:  06/28/2020  Referring physician: Frazier Richards, MD  Chief Complaint: Tina Page is a 64 y.o. 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  HPI: The patient was last seen in the hematology clinic on 06/19/2020. At that time, she felt "fine".  She denied any bleeding.  Exam revealed no adenopathy or hepatosplenomegaly. Hematocrit was 30.4, hemoglobin 9.2, MCV 104.5, platelets 166,000, WBC 6,700. Creatinine was 1.24 (CrCl 47 ml/min). Reticulocyte count was 7.6%. LDH was 164 (improved). Haptoglobin was 18 (37-355).  Prednisone was decreased to 60 mg a day.  Peripheral smear revealed macrocytic anemia. The morphology of RBCs, WBC, and platelets were within normal limits. There was no evidence of circulating blasts or schistocytes.  CBC has been followed: 06/12/2020: Hematocrit of 27.6, hemoglobin   8.4, MCV 104.5, platelets 138,000, WBC 3,400. 06/19/2020: Hematocrit of 30.4, hemoglobin   9.2, MCV 104.5, platelets 166,000, WBC 6,700. 06/24/2020: Hematocrit of 32.8, hemoglobin 10.0, MCV 106.1, platelets 164,000, WBC 9,600.  She took her first dose of entecavir this morning.  During the interim, she has been "good." She has been having some numbness in her legs and feet. She is still able to walk, though her legs feel weak. She has only been sleeping 3-4 hours per night. She has IBS and reports diarrhea. She has had E. coli in the past and agrees to send off a stool sample.  She is scheduled for a low dose chest CT on 07/24/2020.   Past Medical History:  Diagnosis Date  . Anemia   . Atherosclerosis of native arteries of extremity with intermittent claudication ([place]) 07/20/2016  . COPD (chronic obstructive pulmonary disease) ([place])   . Cytomegaloviral disease  ([place]) 07/12/2016  . Elevated liver function tests 11/03/2017  . GERD (gastroesophageal reflux disease)   . Heme positive stool 01/29/2015  . Hepatitis C 07/12/2016  . HLD (hyperlipidemia)   . Hypertension   . Hypothyroidism   . 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 Thrombocytopenia ([place]) 10/04/2016    Past Surgical History:  Procedure Laterality Date  . ELECTROMAGNETIC NAVIGATION BROCHOSCOPY N/A 12/07/2017   Procedure: ELECTROMAGNETIC NAVIGATION BRONCHOSCOPY;  Surgeon: Flora Lipps, MD;  Location: [facility] ORS;  Service: Cardiopulmonary;  Laterality: N/A;  . ESOPHAGOGASTRODUODENOSCOPY (EGD) WITH PROPOFOL N/A 07/08/2016   Procedure: ESOPHAGOGASTRODUODENOSCOPY (EGD) WITH PROPOFOL;  Surgeon: Manya Silvas, MD;  Location: [facility] ENDOSCOPY;  Service: Endoscopy;  Laterality: N/A;  . KNEE SURGERY Right    repair of acl tear  . PERIPHERAL VASCULAR CATHETERIZATION N/A 07/10/2016   Procedure: Lower Extremity Angiography;  Surgeon: Katha Cabal, MD;  Location: [place] [facility];  Service: Cardiovascular;  Laterality: N/A;  . PERIPHERAL VASCULAR CATHETERIZATION N/A 07/10/2016   Procedure: Abdominal Aortogram w/Lower Extremity;  Surgeon: Katha Cabal, MD;  Location: [place] [facility];  Service: Cardiovascular;  Laterality: N/A;  . PERIPHERAL VASCULAR CATHETERIZATION  07/10/2016   Procedure: Lower Extremity Intervention;  Surgeon: Katha Cabal, MD;  Location: [place] [facility];  Service: Cardiovascular;;    Family History  Problem Relation Age of Onset  . Diabetes Mother   . Hypertension Mother   . Diabetes Maternal Grandfather   . Hypertension Maternal Grandfather   . Breast cancer Neg Hx     Social History:  reports that she quit smoking about 3 years ago. Her smoking use included cigarettes. She has a 58.75 pack-year smoking history. She has never used smokeless tobacco. She reports that she does not drink alcohol and does not use drugs. She  has a 21 pack year smoking history (1/2 pack/day from age [age]). The patient works at [employer] She is exposed to cold temperatures. She works 3rd shift. She has a daughter, [name] and a daughter named [name]. She lives in [place]. The patient is alone today.  Allergies:  Allergies  Allergen Reactions  . Codeine Anaphylaxis  . Ciprofloxacin Swelling    Facial swelling following single oral dose.     Current Medications: Current Outpatient Medications  Medication Sig Dispense Refill  . albuterol (PROVENTIL HFA;VENTOLIN HFA) 108 (90 Base) MCG/ACT inhaler Inhale 2 puffs into the lungs every 6 (six) hours as needed for wheezing or shortness of breath.     . allopurinol (ZYLOPRIM) 100 MG tablet TAKE 1 TABLET BY MOUTH EVERY DAY 30 tablet 5  . clopidogrel (PLAVIX) 75 MG tablet Take 1 tablet (75 mg total) by mouth daily. 30 tablet 5  . cyanocobalamin 500 MCG tablet Take 500 mcg by mouth daily.    . folic acid (FOLVITE) 1 MG tablet TAKE 1 TABLET (1 MG TOTAL) BY MOUTH DAILY. 90 tablet 1  . hydrochlorothiazide (HYDRODIURIL) 25 MG tablet Take 25 mg by mouth daily.    [name] Kitchen levothyroxine (SYNTHROID, LEVOTHROID) 75 MCG tablet Take 75 mcg by mouth daily before breakfast.     . lisinopril (PRINIVIL,ZESTRIL) 20 MG tablet Take 20 mg by mouth daily.     [name] Kitchen omeprazole (PRILOSEC) 20 MG capsule Take 1 capsule (20 mg total) by mouth daily. 60 capsule 0  . potassium chloride (KLOR-CON) 20 MEQ packet Take 20 mEq by mouth daily.    . pravastatin (PRAVACHOL) 40 MG tablet Take 40 mg by mouth every evening.    . predniSONE (DELTASONE) 20 MG tablet Take 4 tablets (80 mg total) by mouth daily with breakfast. 40 tablet 1  . Tiotropium Bromide Monohydrate (SPIRIVA RESPIMAT) 2.5 MCG/ACT AERS Inhale 2 puffs into the lungs daily for 1 day. 1 each 0  . entecavir (BARACLUDE) 0.5 MG tablet Take by mouth.     No current facility-administered medications for this visit.    Review of Systems   Constitutional: Negative for chills, diaphoresis, fever, malaise/fatigue and weight loss (stable).       Feels "good."  HENT: Negative for congestion, ear discharge, ear pain, hearing loss, nosebleeds, sinus pain, sore throat and tinnitus.   Eyes: Negative for blurred vision.  Respiratory: Negative for cough, hemoptysis, sputum production and shortness of breath.   Cardiovascular: Negative for chest pain, palpitations and leg swelling.  Gastrointestinal: Positive for diarrhea. Negative for abdominal pain, blood in stool, constipation, heartburn, melena, nausea and vomiting.       IBS  Genitourinary: Negative for dysuria, frequency, hematuria and urgency.  Musculoskeletal: Negative for back pain, joint pain, myalgias and neck pain.  Skin: Negative for itching and rash.  Neurological: Positive for sensory change (numbness in legs and feet) and weakness (legs). Negative for dizziness, tingling and headaches.  Endo/Heme/Allergies: Does not bruise/bleed easily.  Psychiatric/Behavioral: Negative for depression and memory loss. The patient has insomnia (sleeps 3-4 hours per night). The patient is not nervous/anxious.   All other systems reviewed and are negative.  Performance status (ECOG): 1  Vital Signs Blood pressure (!) 139/55, pulse 61, temperature 97.8 F (36.6 C),  temperature source Tympanic, resp. rate 18, height 5\' 3"  (1.6 m), weight 176 lb 11.2 oz (80.2 kg), SpO2 100 %.  Physical Exam Vitals and nursing note reviewed.  Constitutional:      General: She is not in acute distress.    Appearance: She is not diaphoretic.  HENT:     Head: Normocephalic and atraumatic.     Comments: Long gray hair.    Mouth/Throat:     Mouth: Mucous membranes are moist.     Pharynx: Oropharynx is clear. No oropharyngeal exudate or posterior oropharyngeal erythema.  Eyes:     General: No scleral icterus.    Extraocular Movements: Extraocular movements intact.     Conjunctiva/sclera: Conjunctivae  normal.     Pupils: Pupils are equal, round, and reactive to light.     Comments: Dark rimmed glasses.  Cardiovascular:     Rate and Rhythm: Normal rate and regular rhythm.     Heart sounds: Normal heart sounds. No murmur heard.   Pulmonary:     Effort: Pulmonary effort is normal. No respiratory distress.     Breath sounds: Normal breath sounds. No wheezing or rales.  Chest:     Chest wall: No tenderness.  Abdominal:     General: Bowel sounds are normal. There is no distension.     Palpations: Abdomen is soft. There is no hepatomegaly, splenomegaly or mass.     Tenderness: There is no abdominal tenderness. There is no guarding or rebound.  Musculoskeletal:        General: No swelling or tenderness. Normal range of motion.     Cervical back: Normal range of motion and neck supple.  Lymphadenopathy:     Head:     Right side of head: No preauricular, posterior auricular or occipital adenopathy.     Left side of head: No preauricular, posterior auricular or occipital adenopathy.     Cervical: No cervical adenopathy.     Upper Body:     Right upper body: No supraclavicular or axillary adenopathy.     Left upper body: No supraclavicular or axillary adenopathy.     Lower Body: No right inguinal adenopathy. No left inguinal adenopathy.  Skin:    General: Skin is warm and dry.     Coloration: Skin is not jaundiced.  Neurological:     Mental Status: She is alert and oriented to person, place, and time.  Psychiatric:        Behavior: Behavior normal.        Thought Content: Thought content normal.        Judgment: Judgment normal.    Appointment on 06/28/2020  Component Date Value Ref Range Status  . Sodium 06/28/2020 139  135 - 145 mmol/L Final  . Potassium 06/28/2020 4.3  3.5 - 5.1 mmol/L Final  . Chloride 06/28/2020 101  98 - 111 mmol/L Final  . CO2 06/28/2020 30  22 - 32 mmol/L Final  . Glucose, Bld 06/28/2020 82  70 - 99 mg/dL Final   Glucose reference range applies only to  samples taken after fasting for at least 8 hours.  . BUN 06/28/2020 33* 8 - 23 mg/dL Final  . Creatinine, Ser 06/28/2020 1.12* 0.44 - 1.00 mg/dL Final  . Calcium 06/28/2020 8.8* 8.9 - 10.3 mg/dL Final  . GFR, Estimated 06/28/2020 56* >60 mL/min Final   Comment: (NOTE) Calculated using the CKD-EPI Creatinine Equation (2021)   . Anion gap 06/28/2020 8  5 - 15 Final   Performed at  [facility], [address] [place], [place] [address]  . WBC 06/28/2020 8.7  4.0 - 10.5 K/uL Final  . RBC 06/28/2020 3.31* 3.87 - 5.11 MIL/uL Final  . Hemoglobin 06/28/2020 11.0* 12.0 - 15.0 g/dL Final  . HCT 06/28/2020 35.1* 36 - 46 % Final  . MCV 06/28/2020 106.0* 80.0 - 100.0 fL Final  . MCH 06/28/2020 33.2  26.0 - 34.0 pg Final  . MCHC 06/28/2020 31.3  30.0 - 36.0 g/dL Final  . RDW 06/28/2020 15.3  11.5 - 15.5 % Final  . Platelets 06/28/2020 140* 150 - 400 K/uL Final  . nRBC 06/28/2020 0.2  0.0 - 0.2 % Final  . Neutrophils Relative % 06/28/2020 74  % Final  . Neutro Abs 06/28/2020 6.5  1.7 - 7.7 K/uL Final  . Lymphocytes Relative 06/28/2020 16  % Final  . Lymphs Abs 06/28/2020 1.4  0.7 - 4.0 K/uL Final  . Monocytes Relative 06/28/2020 7  % Final  . Monocytes Absolute 06/28/2020 0.6  0.1 - 1.0 K/uL Final  . Eosinophils Relative 06/28/2020 1  % Final  . Eosinophils Absolute 06/28/2020 0.0  0.0 - 0.5 K/uL Final  . Basophils Relative 06/28/2020 0  % Final  . Basophils Absolute 06/28/2020 0.0  0.0 - 0.1 K/uL Final  . Immature Granulocytes 06/28/2020 2  % Final  . Abs Immature Granulocytes 06/28/2020 0.19* 0.00 - 0.07 K/uL Final   Performed at [facility], [address], [place], [place] [address]    Assessment:  Tina Page is a 64 y.o. 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 One week prior to presentation, she felt like she was coming down with something. She denied any fever, runny nose, sore throat or cough. She had  some diarrhea. She denied any new medications or herbal products.  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 Reticulocyte count was 11.3%. Ferritin was 562. Iron studies revealed a saturation of 20% and a TIBC of 214 (low). B12 was 231 (low normal). Folate was 42. Peripheral smear revealed rouleaux formation. Labs on 06/21/2019revealed normal flow cytometry, SPEP, and immunoglobulins.  Additional testingincluded the following+ studies: hepatitis C antibody,hepatitis B core antibody,CMV IgM, and EBV VCA (IgM and IgG). Hepatitis B by PCRwas negative. Hepatitis C RNAwas negative. Mycoplasma pneumonia IgM was negative. Reticulocyte count was 11.3% (high) indicating appropriate marrow response. C3 and C4 were normal. Cold agglutinin titer was negative x 2. Negative studies included: ANA, hepatitis B surface antigen, SPEP, and free light chain ratio. 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 MMA was initially 330 (normal). Repeat MMA was elevated on 08/01/2016 confirming B12 deficiency. Hepatitis B surface antibody was positive and hepatitis B surface antigen was negative on 08/06/2016.  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 There was no adenopathy. Spleen was normal. Abdominal ultrasoundon 04/15/2017 revealed a normal spleen and sludge in the gallbladder. The liver was echogenic consistent fatty infiltration and/or hepatocellular disease.  She underwent PTCA and stent placementin right and left common iliac arteries and left external iliac artery on 07/10/2016.She is on Plavix.EGD on 03/19/2015 revealed gastritis in the body and antrum. Colonoscopyon 03/19/2015 revealed one hyperplastic polyp. EGDon 07/08/2016 was  normal. No evidence of bleeding.  She has received6 units of warmed PRBCsto date (last on 08/01/2016).If  Rituxan is needed, she requires entecavir 0.5 mg q day beginning 2 weeks prior to Rituxan. In addition, hepatitis B viral level and LFTs should be checked monthly.  She began steroids(1 mg/kg) on 08/01/2016. She stopped prednisoneon 03/12/2017. She is onfolic acid1 mg a day.   She hasB12 deficiency. B12 was 231 on 07/09/2016. She is on oral B12. B12 and folate were normal on 03/09/2017 and 10/12/2017.  She developed peri-oral and intranasalherpes simplex-1. She was treated with valacyclovir and doxycycline on 10/19/2016. She developed a transient elevated alkaline phosphataseon 10/19/2016 which subsequently normalized.  She was documented to have awarm autoantibodyon 10/12/2017. LDH and bilirubin were normal. Hematocrit improved. She began prednisone10 mg on 12/07/2017 (discontinued 03/2018).She received Rituxanweekly x 4 (last dose 06/16/2018).  Entecavir was discontinued on 07/06/2019.  She developedflu like symptomson 10/26/2017. Symptoms included cough, myalgias, and fever (tmax unknown). She was prescribed Mucinex, Tamiflu, and amoxicillin. She only took amoxicillin x 5 days. She developedincreased liver function testson 11/02/2017. CMV IgG was positive. EBV VCA IgG, NA IgG, early antigen antibody IgG were elevated. EBV VCA IgM was <36. Testing was c/w a convalescence/past infection or reactivated infection. LFTs normalized on 11/15/2017. Smooth muscle antibodywas 39 (high) on 11/10/2017 and 34 (high) on 11/30/2017.   Labs on 06/12/2020 revealed recurrent anemia.  Hematocrit was 27.6, hemoglobin 8.4, MCV 104.5, platelets 138,000, WBC 3,400 (ANC 2,300). Creatinine was 1.17 (CrCl 50 ml/min).  Normal labs included ferritin (434), iron studies, B12 (885), folate (> 100).  Coombs polyspecific AGH test was positive (warm autoantibody).  Reticulocyte  count was 7.1%.  LDH was 204 (98-192).  Peripheral smear revealed macrocytic anemia with mild thrombocytopenia. The morphology of RBCs, WBCs, and platelets were within normal limits. There was no evidence of circulating blasts or schistocytes.  She began prednisone 1 mg/kg (80 mg) on 06/16/2020.  Prednisone was decreased to 60 mg a day on 06/20/2020.  She began entecavir on 06/28/2020.   Abdomen and pelvic CTon 11/10/2017 revealed a 2.2 cm spiculated density in right middle lobe concerning for malignancy. Chest CTon 11/16/2017 revealed a 2.2 x 2.0 x 2.4 spiculated RIGHT middle lobe mass. There was tiny nonspecific upper lobe pulmonary nodules greater on RIGHT, largest 3 mm, of uncertain etiology. There was additional 8 x 8 mm opacity in the posterior sulcus of the RIGHT lower lobe.  PET scanon 11/22/2017 revealed a 2 cm hypermetabolic right middle lobe lung mass (SUV 3.4) consistent with primary lung neoplasm. There were no findings for mediastinal/hilar lymphadenopathy or metastatic disease. There were areas of hypermetabolism involving the left oblique abdominal muscles and the anorectal junction.  PFTs on 11/18/2017 revealed an FEV1 of 1.22 liters (51%). DLCO adj was 6.8 mL/mmHg/min (32%).  ENB done on 12/07/2017. Cytology was negative for malignancy. Pathologydemonstrated organizing pneumonia and chronic bronchitis. Pathologist commented that organizing pneumonia spanned about 2 mm in one fragment. Cases of focal organizing pneumonia with hypermetabolism on FDG-PET have been described, but changes of this type can also be adjacent to a neoplasm. Patient prescribed a daily dose of Prednisone 10 mg, with re-imaging planned in 6-8 weeks.   Chest CT on 02/06/2018 revealed the spiculated 2.2 cm right middle lobe lesion was almost completely resolved with only a residual 4 mm irregular nodule identified at this location on a background of architectural distortion/evolving scar. There was  a 5 mm right parahilar nodule stable since 11/16/2017(not present on 07/07/2016). There were tiny nodules in both upper lobes suggesting inhalation etiology.  Chest CT on 06/27/2019 revealed the  nodule of the right middle lobe had almost completely resolved, with residual linear scarring. There was persistent paramedian consolidation of the medial right middle lobe and lingula, generally in keeping with atypical infection, particularly atypical mycobacterium. There had been interval increase in size of a small nodule of the right middle lobe, now measuring 4 mm (previously 2 mm).   There was new 6 mm ground-glass opacity of the lateral left upper lobe measuring.  Although nonspecific, these were likely infectious or inflammatory in nature.  Attention on follow-up was receommended. There was unchanged tiny biapical pulmonary nodules, benign sequelae of nonspecific infection or inflammation, possibly related cigarette smoking, atypical infection, or other inhalational, inflammatory lung disease. There was hepatic steatosis.  Chest CT on 12/26/2019 revealed stable sub-cm bilateral pulmonary nodules, consistent with benign etiology. There was stable paramedian consolidation in the right middle lobe and lingula. There was a new airspace disease in inferior right middle lobe, with a few adjacent subcentimeter nodules. Overall, above findings were consistent with waxing and waning atypical infectious process, such as MAI.   She has diarrhea. 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 She received azithromycin x 3 days. She received ciprofloxacin x 10 days without improvement. She has seen ID in [place]. She began Bactrim on 03/04/2018 (discontinued on 03/11/2018) secondary to increase in creatine.  She has renal insufficiency. Creatininehas been followed: 1.17 on 11/05/2017, 1.85 on 11/09/2017, 1.38 on 11/12/2017, 1.74 on 11/15/2017, 1.66 on 11/17/2017, and 1.48 on  11/23/2017. Urinalysis on 11/15/2017 revealed revealed no hemoglobin, bilirubin or active sediment.  Liver function tests increased on 02/15/2019.  Work-up on 02/16/2019 revealed hepatitis B E antibody positive.  Negative studies included: hepatitis A total antibody, hepatitis A IgM, hepatitis B surface antigen, hepatitis B E antigen.  Hepatitis B DNA was not detected.  Folate was 80.5 and vitamin B12 was 443.  She has received her vaccinations:  She has completed the PCV13, PPSV23, and HiB.  She received Menvio (quadrivalent meningoccal vaccine) and Bexero (univalent serogroup B meningoccal vaccine) on 07/06/2019.  She notes that she tested positive for COVID-19 on 08/21/2019.  She received the Moderna COVID-19 vaccine on 11/10/2019 and 12/08/2019.   Symptomatically, she feels "good." She has some numbness in her legs and feet; her legs feel weak. She has only been sleeping 3-4 hours per night. She has IBS and reports diarrhea. She has had E. coli in the past.  Exam is stable.  Hemoglobin is 11.0.  Plan: 1.   Labs today:  CBC with diff, BMP, hepatitis B PCR. 2.Warm autoimmune hemolytic anemia Clinically, she is doing well.  She active warm autoimmune hemolytic anemia responding well to steroids.       Hematocrit35.4. Hemoglobin 11.4.LDH 174 (normal) on 12/28/2019.[name] Kitchen Hematocrit 27.6.  Hemoglobin   8.4.  LDH 204 (98-192) on 06/12/2020.   Work-up confirmed warm autoantibodies.  Hematocrit 30.4.  Hemoglobin   9.2.  LDH 164 (98-192) on 06/19/2020.   She began prednisone 1 mg/kg (80 mg) on 06/16/2020.  Hematocrit 35.1.  Hemoglobin 11.0.  MCV 106.0 on 06/28/2020.   Discussed plan to decrease prednisone to 50 mg a day on 07/01/2020.  Discuss plan for Rituxan (1 gm x 2 or 375 mg/m2 weekly x 4).   Potential side effects re-reviewed including re-activation of hepatitis B.   She began entecavir today.   She has an appointment with infectious disease next week.  Review overall  treatment plan with tapering of steroids every 10 days.  Continue to monitor  closely. 3.Hepatitis B Patient was previously exposed to hepatitis B. She was on entecavir x 1 year when she previously received Rituxan (stopped 07/06/2019). Patient did not complete her meningococcal vaccines.  LFTS were normal on 06/12/2020.  Patient began entecavir 0.5 mg daily on 06/28/2020.   Dose adjusted based on renal function.   CrCl 30-49 ml/min   50% dosing (0.5 mg QOD).   CrCl >= 50 ml/min 100% dosing (0.5 mg a day).  Continue to monitor closely. 4.B12 deficiency B12 was 443 on 02/15/2019 and 885 on 06/12/2020.  Folate was 80.5 on 02/15/2019 and >100 on 06/12/2020.   Discuss decreasing folate supplementation.  Monitor folate levels as active hemolysis is documented. 5. Right middle lobe nodule Chest CT on 06/27/2019 had increased from 2 to 4 mm (unclear significance).   LUL 6 mm ground glass opacity (new).  Chest CT on 12/26/2019 revealed stable sub-cm bilateral pulmonary nodules c/w benign etiology.    There was a new airspace disease in inferior right middle lobe, with a few adjacent subcentimeter nodules.    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  Follow-up with Dr. Mortimer Fries.   6.   RTC weekly on Thursdays x 4 for labs (CBC with diff, BMP). 7.   RTC 07/11/2020 or 07/15/2020 for MD assessment, labs (CBC with diff, CMP, LDH), and week #1 Rituxan.  I discussed the assessment and treatment plan with the patient.  The patient was provided an opportunity to ask questions and all were answered.  The patient agreed with the plan and demonstrated an understanding of the instructions.  The patient was advised to call back or seek an in person evaluation if the symptoms worsen or if the condition fails to improve as anticipated.  I provided 18 minutes of face-to-face time during this this encounter and > 50% was spent  counseling as documented under my assessment and plan.  An additional 12-15 minutes were spent reviewing her chart (Epic and Care Everywhere) including notes, labs, and imaging studies and coordinating care with [facility] providers.    Nolon Stalls, MD, PhD  06/28/2020, 10:18 AM  I, Mirian Mo Tufford, am acting as Education administrator for [employer]. Mike Gip, MD, PhD.  I, Melissa C. Mike Gip, MD, have reviewed the above documentation for accuracy and completeness, and I agree with the above.

## 2020-06-28 ENCOUNTER — Encounter: Payer: Self-pay | Admitting: Hematology and Oncology

## 2020-06-28 ENCOUNTER — Other Ambulatory Visit: Payer: Self-pay

## 2020-06-28 ENCOUNTER — Other Ambulatory Visit: Payer: Self-pay | Admitting: Hematology and Oncology

## 2020-06-28 ENCOUNTER — Inpatient Hospital Stay (HOSPITAL_BASED_OUTPATIENT_CLINIC_OR_DEPARTMENT_OTHER): Payer: BC Managed Care – PPO | Admitting: Hematology and Oncology

## 2020-06-28 ENCOUNTER — Inpatient Hospital Stay: Payer: BC Managed Care – PPO

## 2020-06-28 VITALS — BP 139/55 | HR 61 | Temp 97.8°F | Resp 18 | Ht 63.0 in | Wt 176.7 lb

## 2020-06-28 DIAGNOSIS — D591 Autoimmune hemolytic anemia, unspecified: Secondary | ICD-10-CM

## 2020-06-28 DIAGNOSIS — N289 Disorder of kidney and ureter, unspecified: Secondary | ICD-10-CM

## 2020-06-28 DIAGNOSIS — E538 Deficiency of other specified B group vitamins: Secondary | ICD-10-CM | POA: Diagnosis not present

## 2020-06-28 DIAGNOSIS — R768 Other specified abnormal immunological findings in serum: Secondary | ICD-10-CM | POA: Diagnosis not present

## 2020-06-28 DIAGNOSIS — R911 Solitary pulmonary nodule: Secondary | ICD-10-CM | POA: Diagnosis not present

## 2020-06-28 DIAGNOSIS — D5911 Warm autoimmune hemolytic anemia: Secondary | ICD-10-CM | POA: Diagnosis not present

## 2020-06-28 DIAGNOSIS — Z7189 Other specified counseling: Secondary | ICD-10-CM

## 2020-06-28 LAB — FUNGUS CULTURE WITH STAIN

## 2020-06-28 LAB — CBC WITH DIFFERENTIAL/PLATELET
Abs Immature Granulocytes: 0.19 10*3/uL — ABNORMAL HIGH (ref 0.00–0.07)
Basophils Absolute: 0 10*3/uL (ref 0.0–0.1)
Basophils Relative: 0 %
Eosinophils Absolute: 0 10*3/uL (ref 0.0–0.5)
Eosinophils Relative: 1 %
HCT: 35.1 % — ABNORMAL LOW (ref 36.0–46.0)
Hemoglobin: 11 g/dL — ABNORMAL LOW (ref 12.0–15.0)
Immature Granulocytes: 2 %
Lymphocytes Relative: 16 %
Lymphs Abs: 1.4 10*3/uL (ref 0.7–4.0)
MCH: 33.2 pg (ref 26.0–34.0)
MCHC: 31.3 g/dL (ref 30.0–36.0)
MCV: 106 fL — ABNORMAL HIGH (ref 80.0–100.0)
Monocytes Absolute: 0.6 10*3/uL (ref 0.1–1.0)
Monocytes Relative: 7 %
Neutro Abs: 6.5 10*3/uL (ref 1.7–7.7)
Neutrophils Relative %: 74 %
Platelets: 140 10*3/uL — ABNORMAL LOW (ref 150–400)
RBC: 3.31 MIL/uL — ABNORMAL LOW (ref 3.87–5.11)
RDW: 15.3 % (ref 11.5–15.5)
WBC: 8.7 10*3/uL (ref 4.0–10.5)
nRBC: 0.2 % (ref 0.0–0.2)

## 2020-06-28 LAB — FUNGUS CULTURE RESULT

## 2020-06-28 LAB — BASIC METABOLIC PANEL
Anion gap: 8 (ref 5–15)
BUN: 33 mg/dL — ABNORMAL HIGH (ref 8–23)
CO2: 30 mmol/L (ref 22–32)
Calcium: 8.8 mg/dL — ABNORMAL LOW (ref 8.9–10.3)
Chloride: 101 mmol/L (ref 98–111)
Creatinine, Ser: 1.12 mg/dL — ABNORMAL HIGH (ref 0.44–1.00)
GFR, Estimated: 56 mL/min — ABNORMAL LOW (ref >60)
Glucose, Bld: 82 mg/dL (ref 70–99)
Potassium: 4.3 mmol/L (ref 3.5–5.1)
Sodium: 139 mmol/L (ref 135–145)

## 2020-06-28 LAB — FUNGAL ORGANISM REFLEX

## 2020-06-28 NOTE — Patient Instructions (Signed)
  Take entecevir 0.5 mg by mouth once a day.  On 07/01/2020 decrease prednisone to 50 mg a day.

## 2020-06-29 LAB — HEPATITIS B DNA, ULTRAQUANTITATIVE, PCR
HBV DNA SERPL PCR-ACNC: NOT DETECTED IU/mL
HBV DNA SERPL PCR-LOG IU: UNDETERMINED log10 IU/mL

## 2020-07-02 ENCOUNTER — Encounter
Admit: 2020-07-02 | Discharge: 2020-07-03 | Payer: PRIVATE HEALTH INSURANCE | Attending: Internal Medicine | Primary: Internal Medicine

## 2020-07-02 DIAGNOSIS — R768 Other specified abnormal immunological findings in serum: Principal | ICD-10-CM

## 2020-07-02 DIAGNOSIS — Z79899 Other long term (current) drug therapy: Principal | ICD-10-CM

## 2020-07-02 DIAGNOSIS — Z23 Encounter for immunization: Principal | ICD-10-CM

## 2020-07-02 DIAGNOSIS — Z8619 Personal history of other infectious and parasitic diseases: Principal | ICD-10-CM

## 2020-07-02 DIAGNOSIS — D591 AIHA (autoimmune hemolytic anemia) (CMS-HCC): Principal | ICD-10-CM

## 2020-07-02 MED ORDER — CYANOCOBALAMIN (VIT B-12) 500 MCG TABLET
Freq: Every day | ORAL | 0 refills | 0 days
Start: 2020-07-02 — End: ?

## 2020-07-02 MED ORDER — ENTECAVIR 0.5 MG TABLET
ORAL_TABLET | Freq: Every day | ORAL | 2 refills | 30.00000 days | Status: CP
Start: 2020-07-02 — End: 2020-09-30

## 2020-07-04 ENCOUNTER — Inpatient Hospital Stay: Payer: BC Managed Care – PPO | Attending: Hematology and Oncology

## 2020-07-04 ENCOUNTER — Other Ambulatory Visit: Payer: Self-pay

## 2020-07-04 DIAGNOSIS — Z8249 Family history of ischemic heart disease and other diseases of the circulatory system: Secondary | ICD-10-CM | POA: Diagnosis not present

## 2020-07-04 DIAGNOSIS — B1910 Unspecified viral hepatitis B without hepatic coma: Secondary | ICD-10-CM | POA: Insufficient documentation

## 2020-07-04 DIAGNOSIS — Z7902 Long term (current) use of antithrombotics/antiplatelets: Secondary | ICD-10-CM | POA: Insufficient documentation

## 2020-07-04 DIAGNOSIS — Z833 Family history of diabetes mellitus: Secondary | ICD-10-CM | POA: Insufficient documentation

## 2020-07-04 DIAGNOSIS — D5911 Warm autoimmune hemolytic anemia: Secondary | ICD-10-CM | POA: Diagnosis present

## 2020-07-04 DIAGNOSIS — Z79899 Other long term (current) drug therapy: Secondary | ICD-10-CM | POA: Diagnosis not present

## 2020-07-04 DIAGNOSIS — E538 Deficiency of other specified B group vitamins: Secondary | ICD-10-CM | POA: Insufficient documentation

## 2020-07-04 DIAGNOSIS — Z87891 Personal history of nicotine dependence: Secondary | ICD-10-CM | POA: Diagnosis not present

## 2020-07-04 DIAGNOSIS — R918 Other nonspecific abnormal finding of lung field: Secondary | ICD-10-CM | POA: Insufficient documentation

## 2020-07-04 DIAGNOSIS — D591 Autoimmune hemolytic anemia, unspecified: Secondary | ICD-10-CM

## 2020-07-04 LAB — CBC WITH DIFFERENTIAL/PLATELET
Abs Immature Granulocytes: 0.05 10*3/uL (ref 0.00–0.07)
Basophils Absolute: 0 10*3/uL (ref 0.0–0.1)
Basophils Relative: 0 %
Eosinophils Absolute: 0.1 10*3/uL (ref 0.0–0.5)
Eosinophils Relative: 1 %
HCT: 37.7 % (ref 36.0–46.0)
Hemoglobin: 11.9 g/dL — ABNORMAL LOW (ref 12.0–15.0)
Immature Granulocytes: 1 %
Lymphocytes Relative: 13 %
Lymphs Abs: 1 10*3/uL (ref 0.7–4.0)
MCH: 32.6 pg (ref 26.0–34.0)
MCHC: 31.6 g/dL (ref 30.0–36.0)
MCV: 103.3 fL — ABNORMAL HIGH (ref 80.0–100.0)
Monocytes Absolute: 0.5 10*3/uL (ref 0.1–1.0)
Monocytes Relative: 7 %
Neutro Abs: 5.9 10*3/uL (ref 1.7–7.7)
Neutrophils Relative %: 78 %
Platelets: 131 10*3/uL — ABNORMAL LOW (ref 150–400)
RBC: 3.65 MIL/uL — ABNORMAL LOW (ref 3.87–5.11)
RDW: 14.8 % (ref 11.5–15.5)
WBC: 7.5 10*3/uL (ref 4.0–10.5)
nRBC: 0 % (ref 0.0–0.2)

## 2020-07-04 LAB — BASIC METABOLIC PANEL
Anion gap: 9 (ref 5–15)
BUN: 38 mg/dL — ABNORMAL HIGH (ref 8–23)
CO2: 32 mmol/L (ref 22–32)
Calcium: 8.9 mg/dL (ref 8.9–10.3)
Chloride: 97 mmol/L — ABNORMAL LOW (ref 98–111)
Creatinine, Ser: 1.17 mg/dL — ABNORMAL HIGH (ref 0.44–1.00)
GFR, Estimated: 53 mL/min — ABNORMAL LOW (ref >60)
Glucose, Bld: 81 mg/dL (ref 70–99)
Potassium: 3.8 mmol/L (ref 3.5–5.1)
Sodium: 138 mmol/L (ref 135–145)

## 2020-07-08 NOTE — Progress Notes (Addendum)
..  The following Medication: Rituxan is approved for drug replacement program by [place]. The enrollment period is from 07/05/2020 to Indefinitely.  Reason for Assistance: EOOP/Off label. ID: PAT - [id number] First DOS:07/11/2020.

## 2020-07-09 ENCOUNTER — Other Ambulatory Visit: Payer: Self-pay | Admitting: Hematology and Oncology

## 2020-07-09 DIAGNOSIS — D599 Acquired hemolytic anemia, unspecified: Secondary | ICD-10-CM

## 2020-07-10 ENCOUNTER — Telehealth: Payer: Self-pay

## 2020-07-10 NOTE — Telephone Encounter (Signed)
Left a message to inform the  patient to inform her that, Per Dr Mike Gip she need to hold the folic acid x 2 weeks then repeats labs at that time. The patient schedule for 10:15 on 07/23/2020.

## 2020-07-10 NOTE — Telephone Encounter (Signed)
Spoke with the patient to inform her that Per Dr Mike Gip he folic acoid levels are to high and to see how  much Predinsone is she taking at this time. The patient reports she is taking Folic acid 1 mg daily and Prednisone 50 mg. I have also informed her of the appointment time and date and the reason why we have scheduled the way she is. I have also informed her that Dr Mike Gip will continue to monitor CBC and taper steroids every 10 days. The patient was understanding and agreeable.

## 2020-07-10 NOTE — Telephone Encounter (Signed)
Spoke with the patient to inform her Per Dr Mike Gip hold the folic acid x 2 weeks and then recheck labs. The patient was understanding and agreeable.

## 2020-07-10 NOTE — Telephone Encounter (Signed)
-----   Message from Lequita Asal, MD sent at 07/10/2020  1:17 PM EST ----- Regarding: RE: Please call patient  Stop folic acid x 2 weeks and recheck level.  M ----- Message ----- From: Vito Berger, [place]: 07/10/2020  12:41 PM EST To: Lequita Asal, MD Subject: RE: Please call patient                        Spoke with the patient and she reports she is taking Folic acid 1 mg 1 tab po daily and Prednisone 50 mg 1 tab po daily. ----- Message ----- From: Lequita Asal, MD Sent: 07/06/2020  10:10 AM EST To: Starlyn Skeans, CMA Subject: Please call patient                             Please find out how much folic acid she is taking.  It is high.  Please find out how much prednisone she is taking.      She should have decreased to 50 mg a day on 07/01/2020.  Plan to keep CBC check on 07/11/2020, but consider moving Rituxan from 11/11 to 11/15. We are trying to give Rituxan every other week x 2 (11/11 would then fall on Thanksgiving 11/25).  Would move my appt and Rituxan to 11/15 and 11/29 (Mondays).  We will continue to monitor CBC and taper steroids every 10 days. Does she need a refill of steroids?  M

## 2020-07-10 NOTE — Telephone Encounter (Signed)
-----   Message from Lequita Asal, MD sent at 07/06/2020 10:10 AM EDT ----- Regarding: Please call patient  Please find out how much folic acid she is taking.  It is high.  Please find out how much prednisone she is taking.      She should have decreased to 50 mg a day on 07/01/2020.  Plan to keep CBC check on 07/11/2020, but consider moving Rituxan from 11/11 to 11/15. We are trying to give Rituxan every other week x 2 (11/11 would then fall on Thanksgiving 11/25).  Would move my appt and Rituxan to 11/15 and 11/29 (Mondays).  We will continue to monitor CBC and taper steroids every 10 days. Does she need a refill of steroids?  M

## 2020-07-11 ENCOUNTER — Ambulatory Visit: Payer: BC Managed Care – PPO | Admitting: Hematology and Oncology

## 2020-07-11 ENCOUNTER — Ambulatory Visit: Payer: BC Managed Care – PPO

## 2020-07-11 ENCOUNTER — Other Ambulatory Visit: Payer: BC Managed Care – PPO

## 2020-07-11 NOTE — Progress Notes (Signed)
[facility]  [address], [address] [place], [place] [address] Phone: [phone]  Fax: [phone]   Clinic Day:  07/15/2020  Referring physician: Frazier Richards, MD  Chief Complaint: Tina Page is a 64 y.o. 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  HPI: The patient was last seen in the hematology clinic on 06/28/2020. At that time, she felt "good." She had some numbness in her legs and feet; her legs feel weak. She had only been sleeping 3-4 hours per night. She had IBS and reported diarrhea. Exam was stable. Hematocrit was 35.1, hemoglobin 11.0, MCV 106.0, platelets 140,000, WBC 8,700.  Creatinine was 1.12 (CrCl 56 ml/min). Calcium was 8.8. HBV DNA was not detected.  She began entecavir on 06/28/2020.  Prednisone was decreased to 50 mg on 07/01/2020.  Labs on 07/04/2020 revealed a hematocrit of 37.7, hemoglobin 11.9, MCV 103.3, platelets 131,000, WBC 7,500. Creatinine was 1.17 (CrCl 53 ml/min).  Low dose chest CT is scheduled for 07/24/2020.  During the interim, she has been "ok".  She reports easy bruising and tenderness underneath her upper dentures. She states that there is a sore in her mouth but "it will go away." She denies any change in urine color. She is having trouble sleeping and only sleeps 3-4 hours per night.  She is still taking allopurinol, entecavir, and Plavix. She is currently taking prednisone 50 mg but will decrease to 40 mg a day.  She would like a leave of absence from work. She is ready for treatment today.   Past Medical History:  Diagnosis Date  . Anemia   . Atherosclerosis of native arteries of extremity with intermittent claudication ([place]) 07/20/2016  . COPD (chronic obstructive pulmonary disease) ([place])   . 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 Elevated liver function tests 11/03/2017  . GERD (gastroesophageal reflux disease)    . Heme positive stool 01/29/2015  . Hepatitis C 07/12/2016  . HLD (hyperlipidemia)   . Hypertension   . Hypothyroidism   . 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 Thrombocytopenia ([place]) 10/04/2016    Past Surgical History:  Procedure Laterality Date  . ELECTROMAGNETIC NAVIGATION BROCHOSCOPY N/A 12/07/2017   Procedure: ELECTROMAGNETIC NAVIGATION BRONCHOSCOPY;  Surgeon: Flora Lipps, MD;  Location: [facility] ORS;  Service: Cardiopulmonary;  Laterality: N/A;  . ESOPHAGOGASTRODUODENOSCOPY (EGD) WITH PROPOFOL N/A 07/08/2016   Procedure: ESOPHAGOGASTRODUODENOSCOPY (EGD) WITH PROPOFOL;  Surgeon: Manya Silvas, MD;  Location: [facility] ENDOSCOPY;  Service: Endoscopy;  Laterality: N/A;  . KNEE SURGERY Right    repair of acl tear  . PERIPHERAL VASCULAR CATHETERIZATION N/A 07/10/2016   Procedure: Lower Extremity Angiography;  Surgeon: Katha Cabal, MD;  Location: [place] CV LAB;  Service: Cardiovascular;  Laterality: N/A;  . PERIPHERAL VASCULAR CATHETERIZATION N/A 07/10/2016   Procedure: Abdominal Aortogram w/Lower Extremity;  Surgeon: Katha Cabal, MD;  Location: [place] CV LAB;  Service: Cardiovascular;  Laterality: N/A;  . PERIPHERAL VASCULAR CATHETERIZATION  07/10/2016   Procedure: Lower Extremity Intervention;  Surgeon: Katha Cabal, MD;  Location: [place] CV LAB;  Service: Cardiovascular;;    Family History  Problem Relation Age of Onset  . Diabetes Mother   . Hypertension Mother   . Diabetes Maternal Grandfather   . Hypertension Maternal Grandfather   . Breast cancer Neg Hx     Social History:  reports that she quit smoking  about 4 years ago. Her smoking use included cigarettes. She has a 58.75 pack-year smoking history. She has never used smokeless tobacco. She reports that she does not drink alcohol and does not use drugs. She has a 21 pack year smoking history (1/2 pack/day from age [age]). The patient works at [employer]. She is exposed to cold temperatures. She works 3rd shift. She has a daughter, [name] and a daughter named [name]. She lives in [place]. The patient is alone today.  Allergies:  Allergies  Allergen Reactions  . Codeine Anaphylaxis  . Ciprofloxacin Swelling    Facial swelling following single oral dose.     Current Medications: Current Outpatient Medications  Medication Sig Dispense Refill  . albuterol (PROVENTIL HFA;VENTOLIN HFA) 108 (90 Base) MCG/ACT inhaler Inhale 2 puffs into the lungs every 6 (six) hours as needed for wheezing or shortness of breath.     . allopurinol (ZYLOPRIM) 100 MG tablet TAKE 1 TABLET BY MOUTH EVERY DAY 30 tablet 5  . clopidogrel (PLAVIX) 75 MG tablet Take 1 tablet (75 mg total) by mouth daily. 30 tablet 5  . cyanocobalamin 500 MCG tablet Take 500 mcg by mouth daily.    [name] Kitchen entecavir (BARACLUDE) 0.5 MG tablet Take by mouth.    . hydrochlorothiazide (HYDRODIURIL) 25 MG tablet Take 25 mg by mouth daily.    [name] Kitchen levothyroxine (SYNTHROID, LEVOTHROID) 75 MCG tablet Take 75 mcg by mouth daily before breakfast.     . lisinopril (PRINIVIL,ZESTRIL) 20 MG tablet Take 20 mg by mouth daily.     [name] Kitchen omeprazole (PRILOSEC) 20 MG capsule Take 1 capsule (20 mg total) by mouth daily. 60 capsule 0  . potassium chloride (KLOR-CON) 20 MEQ packet Take 20 mEq by mouth daily.    . pravastatin (PRAVACHOL) 40 MG tablet Take 40 mg by mouth every evening.    . predniSONE (DELTASONE) 20 MG tablet TAKE 4 TABLETS BY MOUTH ONCE DAILY WITH BREAKFAST 40 tablet 0  . Tiotropium Bromide Monohydrate (SPIRIVA RESPIMAT) 2.5 MCG/ACT AERS Inhale 2 puffs into the lungs daily for 1 day. 1 each 0  . folic acid (FOLVITE) 1 MG tablet TAKE 1 TABLET (1 MG TOTAL) BY MOUTH DAILY. (Patient not taking: Reported on 07/12/2020) 90 tablet 1   No current facility-administered medications for this visit.    Review of Systems  Constitutional: Positive for weight loss (8 lbs). Negative for chills,  diaphoresis, fever and malaise/fatigue.       Feels "okay."  HENT: Negative for congestion, ear discharge, ear pain, hearing loss, nosebleeds, sinus pain, sore throat and tinnitus.        Mouth sore. Tenderness underneath upper dentures.  Eyes: Negative for blurred vision.  Respiratory: Negative for cough, hemoptysis, sputum production and shortness of breath.   Cardiovascular: Negative for chest pain, palpitations and leg swelling.  Gastrointestinal: Negative for abdominal pain, blood in stool, constipation, diarrhea, heartburn, melena, nausea and vomiting.       IBS  Genitourinary: Negative for dysuria, frequency, hematuria and urgency.  Musculoskeletal: Negative for back pain, joint pain, myalgias and neck pain.  Skin: Negative for itching and rash.  Neurological: Negative for dizziness, tingling, sensory change, weakness and headaches.  Endo/Heme/Allergies: Bruises/bleeds easily.  Psychiatric/Behavioral: Negative for depression and memory loss. The patient has insomnia (sleeps 3-4 hours per night). The patient is not nervous/anxious.   All other systems reviewed and are negative.  Performance status (ECOG): 1  Vital Signs Blood pressure (!) 166/79, pulse 85, resp. rate 18, height  5\' 3"  (1.6 m), weight 168 lb 12.6 oz (76.6 kg), SpO2 97 %.  Physical Exam Vitals and nursing note reviewed.  Constitutional:      General: She is not in acute distress.    Appearance: She is not diaphoretic.  HENT:     Head: Normocephalic and atraumatic.     Comments: Long gray hair.    Mouth/Throat:     Mouth: Mucous membranes are moist.     Pharynx: Oropharynx is clear. No oropharyngeal exudate or posterior oropharyngeal erythema.     Comments: No thrush. Eyes:     General: No scleral icterus.    Extraocular Movements: Extraocular movements intact.     Conjunctiva/sclera: Conjunctivae normal.     Pupils: Pupils are equal, round, and reactive to light.     Comments: Dark rimmed glasses.   Cardiovascular:     Rate and Rhythm: Normal rate and regular rhythm.     Heart sounds: Normal heart sounds. No murmur heard.   Pulmonary:     Effort: Pulmonary effort is normal. No respiratory distress.     Breath sounds: Normal breath sounds. No wheezing or rales.  Chest:     Chest wall: No tenderness.  Abdominal:     General: Bowel sounds are normal. There is no distension.     Palpations: Abdomen is soft. There is no hepatomegaly, splenomegaly or mass.     Tenderness: There is no abdominal tenderness. There is no guarding or rebound.  Musculoskeletal:        General: No swelling or tenderness. Normal range of motion.     Cervical back: Normal range of motion and neck supple.  Lymphadenopathy:     Head:     Right side of head: No preauricular, posterior auricular or occipital adenopathy.     Left side of head: No preauricular, posterior auricular or occipital adenopathy.     Cervical: No cervical adenopathy.     Upper Body:     Right upper body: No supraclavicular or axillary adenopathy.     Left upper body: No supraclavicular or axillary adenopathy.     Lower Body: No right inguinal adenopathy. No left inguinal adenopathy.  Skin:    General: Skin is warm and dry.     Coloration: Skin is not jaundiced.  Neurological:     Mental Status: She is alert and oriented to person, place, and time.  Psychiatric:        Behavior: Behavior normal.        Thought Content: Thought content normal.        Judgment: Judgment normal.    Appointment on 07/15/2020  Component Date Value Ref Range Status  . Sodium 07/15/2020 139  135 - 145 mmol/L Final  . Potassium 07/15/2020 4.3  3.5 - 5.1 mmol/L Final  . Chloride 07/15/2020 101  98 - 111 mmol/L Final  . CO2 07/15/2020 29  22 - 32 mmol/L Final  . Glucose, Bld 07/15/2020 123* 70 - 99 mg/dL Final   Glucose reference range applies only to samples taken after fasting for at least 8 hours.  . BUN 07/15/2020 38* 8 - 23 mg/dL Final  .  Creatinine, Ser 07/15/2020 1.24* 0.44 - 1.00 mg/dL Final  . Calcium 07/15/2020 9.4  8.9 - 10.3 mg/dL Final  . Total Protein 07/15/2020 7.1  6.5 - 8.1 g/dL Final  . Albumin 07/15/2020 4.2  3.5 - 5.0 g/dL Final  . AST 07/15/2020 16  15 - 41 U/L Final  . ALT  07/15/2020 29  0 - 44 U/L Final  . Alkaline Phosphatase 07/15/2020 52  38 - 126 U/L Final  . Total Bilirubin 07/15/2020 1.1  0.3 - 1.2 mg/dL Final  . GFR, Estimated 07/15/2020 49* >60 mL/min Final   Comment: (NOTE) Calculated using the CKD-EPI Creatinine Equation (2021)   . Anion gap 07/15/2020 9  5 - 15 Final   Performed at [facility], [address] [place], [place] [address]  . WBC 07/15/2020 7.4  4.0 - 10.5 K/uL Final  . RBC 07/15/2020 4.01  3.87 - 5.11 MIL/uL Final  . Hemoglobin 07/15/2020 13.1  12.0 - 15.0 g/dL Final  . HCT 07/15/2020 40.1  36 - 46 % Final  . MCV 07/15/2020 100.0  80.0 - 100.0 fL Final  . MCH 07/15/2020 32.7  26.0 - 34.0 pg Final  . MCHC 07/15/2020 32.7  30.0 - 36.0 g/dL Final  . RDW 07/15/2020 13.7  11.5 - 15.5 % Final  . Platelets 07/15/2020 PENDING  150 - 400 K/uL Incomplete  . nRBC 07/15/2020 0.0  0.0 - 0.2 % Final   Performed at [facility], [address], [place], [place] [address]  . Neutrophils Relative % 07/15/2020 PENDING  % Incomplete  . Neutro Abs 07/15/2020 PENDING  1.7 - 7.7 K/uL Incomplete  . Band Neutrophils 07/15/2020 PENDING  % Incomplete  . Lymphocytes Relative 07/15/2020 PENDING  % Incomplete  . Lymphs Abs 07/15/2020 PENDING  0.7 - 4.0 K/uL Incomplete  . Monocytes Relative 07/15/2020 PENDING  % Incomplete  . Monocytes Absolute 07/15/2020 PENDING  0.1 - 1.0 K/uL Incomplete  . Eosinophils Relative 07/15/2020 PENDING  % Incomplete  . Eosinophils Absolute 07/15/2020 PENDING  0.0 - 0.5 K/uL Incomplete  . Basophils Relative 07/15/2020 PENDING  % Incomplete  . Basophils Absolute 07/15/2020 PENDING  0.0 - 0.1 K/uL Incomplete  . WBC Morphology 07/15/2020 PENDING    Incomplete  . RBC Morphology 07/15/2020 PENDING   Incomplete  . Smear Review 07/15/2020 PENDING   Incomplete  . Other 07/15/2020 PENDING  % Incomplete  . nRBC 07/15/2020 PENDING  0 /100 WBC Incomplete  . Metamyelocytes Relative 07/15/2020 PENDING  % Incomplete  . Myelocytes 07/15/2020 PENDING  % Incomplete  . Promyelocytes Relative 07/15/2020 PENDING  % Incomplete  . Blasts 07/15/2020 PENDING  % Incomplete  . Immature Granulocytes 07/15/2020 PENDING  % Incomplete  . Abs Immature Granulocytes 07/15/2020 PENDING  0.00 - 0.07 K/uL Incomplete  . 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 One week prior to presentation, she felt like she was coming down with something. She denied any fever, runny nose, sore throat or cough. She had some diarrhea. She denied any new medications or herbal products.  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 Reticulocyte count was 11.3%. Ferritin was 562. Iron studies revealed a saturation of 20% and a TIBC of 214 (low). B12 was 231 (low normal). Folate was 42. Peripheral smear revealed rouleaux formation. Labs on 06/21/2019revealed normal flow cytometry, SPEP, and immunoglobulins.  Additional testingincluded the following+ studies: hepatitis C antibody,hepatitis B core antibody,CMV IgM, and EBV VCA (IgM and IgG). Hepatitis B by PCRwas negative. Hepatitis C RNAwas negative. Mycoplasma pneumonia IgM was negative. Reticulocyte count was 11.3% (high) indicating appropriate marrow  response. C3 and C4 were normal. Cold agglutinin titer was negative x 2. Negative studies included: ANA, hepatitis B surface antigen, SPEP, and free light chain ratio. 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 MMA was initially 330 (normal). Repeat MMA was elevated on 08/01/2016 confirming B12 deficiency. Hepatitis B surface antibody was positive and hepatitis B surface antigen was negative on 08/06/2016.  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 There was no adenopathy. Spleen was normal. Abdominal ultrasoundon 04/15/2017 revealed a normal spleen and sludge in the gallbladder. The liver was echogenic consistent fatty infiltration and/or hepatocellular disease.  She underwent PTCA and stent placementin right and left common iliac arteries and left external iliac artery on 07/10/2016.She is on Plavix.EGD on 03/19/2015 revealed gastritis in the body and antrum. Colonoscopyon 03/19/2015 revealed one hyperplastic polyp. EGDon 07/08/2016 was normal. No evidence of bleeding.  She has received6 units of warmed PRBCsto date (last on 08/01/2016).If Rituxan is needed, she requires entecavir 0.5 mg q day beginning 2 weeks prior to Rituxan. In addition, hepatitis B viral level and LFTs should be checked monthly.  She began steroids(1 mg/kg) on 08/01/2016. She stopped prednisoneon 03/12/2017. She is onfolic acid1 mg a day.   She hasB12 deficiency. B12 was 231 on 07/09/2016. She is on oral B12. B12 and folate were normal on 03/09/2017 and 10/12/2017.  She developed peri-oral and intranasalherpes simplex-1. She was treated with valacyclovir and doxycycline on 10/19/2016. She developed a transient elevated alkaline phosphataseon 10/19/2016 which subsequently normalized.  She was documented to have awarm autoantibodyon 10/12/2017. LDH and bilirubin were normal. Hematocrit improved. She began prednisone10 mg on 12/07/2017 (discontinued 03/2018).She received Rituxanweekly x  4 (last dose 06/16/2018).  Entecavir was discontinued on 07/06/2019.  She developedflu like symptomson 10/26/2017. Symptoms included cough, myalgias, and fever (tmax unknown). She was prescribed Mucinex, Tamiflu, and amoxicillin. She only took amoxicillin x 5 days. She developedincreased liver function testson 11/02/2017. CMV IgG was positive. EBV VCA IgG, NA IgG, early antigen antibody IgG were elevated. EBV VCA IgM was <36. Testing was c/w a convalescence/past infection or reactivated infection. LFTs normalized on 11/15/2017. Smooth muscle antibodywas 39 (high) on 11/10/2017 and 34 (high) on 11/30/2017.   Labs on 06/12/2020 revealed recurrent anemia.  Hematocrit was 27.6, hemoglobin 8.4, MCV 104.5, platelets 138,000, WBC 3,400 (ANC 2,300). Creatinine was 1.17 (CrCl 50 ml/min).  Normal labs included ferritin (434), iron studies, B12 (885), folate (> 100).  Coombs polyspecific A[facility] test was positive (warm autoantibody).  Reticulocyte count was 7.1%.  LDH was 204 (98-192).  Peripheral smear revealed macrocytic anemia with mild thrombocytopenia. The morphology of RBCs, WBCs, and platelets were within normal limits. There was no evidence of circulating blasts or schistocytes.  She began prednisone 1 mg/kg (80 mg) on 06/16/2020.  Prednisone was decreased to 60 mg a day (06/20/2020) and 50 mg a day (07/01/2020).  She began entecavir on 06/28/2020.   Abdomen and pelvic CTon 11/10/2017 revealed a 2.2 cm spiculated density in right middle lobe concerning for malignancy. Chest CTon 11/16/2017 revealed a 2.2 x 2.0 x 2.4 spiculated RIGHT middle lobe mass. There was tiny nonspecific upper lobe pulmonary nodules greater on RIGHT, largest 3 mm, of uncertain etiology. There was additional 8 x 8 mm opacity in the posterior sulcus of the RIGHT lower lobe.  PET scanon 11/22/2017 revealed a 2 cm hypermetabolic  right middle lobe lung mass (SUV 3.4) consistent with primary lung neoplasm. There were  no findings for mediastinal/hilar lymphadenopathy or metastatic disease. There were areas of hypermetabolism involving the left oblique abdominal muscles and the anorectal junction.  PFTs on 11/18/2017 revealed an FEV1 of 1.22 liters (51%). DLCO adj was 6.8 mL/mmHg/min (32%).  ENB done on 12/07/2017. Cytology was negative for malignancy. Pathologydemonstrated organizing pneumonia and chronic bronchitis. Pathologist commented that organizing pneumonia spanned about 2 mm in one fragment. Cases of focal organizing pneumonia with hypermetabolism on FDG-PET have been described, but changes of this type can also be adjacent to a neoplasm. Patient prescribed a daily dose of Prednisone 10 mg, with re-imaging planned in 6-8 weeks.   Chest CT on 02/06/2018 revealed the spiculated 2.2 cm right middle lobe lesion was almost completely resolved with only a residual 4 mm irregular nodule identified at this location on a background of architectural distortion/evolving scar. There was a 5 mm right parahilar nodule stable since 11/16/2017(not present on 07/07/2016). There were tiny nodules in both upper lobes suggesting inhalation etiology.  Chest CT on 06/27/2019 revealed the nodule of the right middle lobe had almost completely resolved, with residual linear scarring. There was persistent paramedian consolidation of the medial right middle lobe and lingula, generally in keeping with atypical infection, particularly atypical mycobacterium. There had been interval increase in size of a small nodule of the right middle lobe, now measuring 4 mm (previously 2 mm).   There was new 6 mm ground-glass opacity of the lateral left upper lobe measuring.  Although nonspecific, these were likely infectious or inflammatory in nature.  Attention on follow-up was receommended. There was unchanged tiny biapical pulmonary nodules, benign sequelae of nonspecific infection or inflammation, possibly related cigarette smoking, atypical  infection, or other inhalational, inflammatory lung disease. There was hepatic steatosis.  Chest CT on 12/26/2019 revealed stable sub-cm bilateral pulmonary nodules, consistent with benign etiology. There was stable paramedian consolidation in the right middle lobe and lingula. There was a new airspace disease in inferior right middle lobe, with a few adjacent subcentimeter nodules. Overall, above findings were consistent with waxing and waning atypical infectious process, such as MAI.   She has diarrhea. 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 She received azithromycin x 3 days. She received ciprofloxacin x 10 days without improvement. She has seen ID in [place]. She began Bactrim on 03/04/2018 (discontinued on 03/11/2018) secondary to increase in creatine.  She has renal insufficiency. Creatininehas been followed: 1.17 on 11/05/2017, 1.85 on 11/09/2017, 1.38 on 11/12/2017, 1.74 on 11/15/2017, 1.66 on 11/17/2017, and 1.48 on 11/23/2017. Urinalysis on 11/15/2017 revealed revealed no hemoglobin, bilirubin or active sediment.  Liver function tests increased on 02/15/2019.  Work-up on 02/16/2019 revealed hepatitis B E antibody positive.  Negative studies included: hepatitis A total antibody, hepatitis A IgM, hepatitis B surface antigen, hepatitis B E antigen.  Hepatitis B DNA was not detected.  Folate was 80.5 and vitamin B12 was 443.  She has received her vaccinations:  She has completed the PCV13, PPSV23, and HiB.  She received Menvio (quadrivalent meningoccal vaccine) and Bexero (univalent serogroup B meningoccal vaccine) on 07/06/2019.  She notes that she tested positive for COVID-19 on 08/21/2019.  She received the Moderna COVID-19 vaccine on 11/10/2019 and 12/08/2019.   Symptomatically, she feels "ok".  She reports easy bruising.  She denies any change in urine color.  She can sleep only 3-4 hours per night secondary to steroids.  She is on  prednisone  50 mg a day.  Plan: 1.   Labs today:  CBC with diff, CMP, LDH 2.Warm autoimmune hemolytic anemia Clinically, she is doing well  She active warm autoimmune hemolytic anemia responding well to steroids.       Hematocrit35.4. Hemoglobin 11.4.LDH 174 (normal) on 12/28/2019. Hematocrit 27.6.  Hemoglobin   8.4.  LDH 204 (98-192) on 06/12/2020.   Work-up confirmed warm autoantibodies.  Hematocrit 30.4.  Hemoglobin   9.2.  LDH 164 (98-192) on 06/19/2020.   She began prednisone 1 mg/kg (80 mg) on 06/16/2020.  Hematocrit 35.1.  Hemoglobin 11.0.  MCV 106.0 on 06/28/2020.   Decrease prednisone to 50 mg a day on 07/01/2020.  Hematocrit 40.1.  Hemoglobin 13.1.  LDH 262.   Decrease prednisone to 40 mg a day.   Taper prednisone every 10 days.   Anticipate next prednisone taper on 07/24/2020.  Review plan for Rituxan (1 gm IV on day 1 and 15).   Patient on entecavir to prevent re-activation of hepatitis B.  Review labs.  Begin day 1 of Rituxan.   Patient consented to treatment.  Continue to monitor labs weekly. 3.Hepatitis B Patient was previously exposed to hepatitis B. She was on entecavir x 1 year when she previously received Rituxan (stopped 07/06/2019). Patient did not complete her meningococcal vaccines.  LFTs are normal.  Patient began entecavir 0.5 mg daily on 06/28/2020.   Dose adjusted based on renal function.   CrCl 30-49 ml/min   50% dosing (0.5 mg QOD).   CrCl >= 50 ml/min 100% dosing (0.5 mg a day).  Continue to monitor closely. 4.B12 deficiency B12 was 443 on 02/15/2019 and 885 on 06/12/2020.  Folate was 80.5 on 02/15/2019 and >100 on 06/12/2020.   Folate on hold x 2 weeks with plan for f/u level and decrease in supplementation.  Continue to monitor folate levels secondary to active hemolysis. 5. Right middle lobe nodule Chest CT on 06/27/2019 had increased from 2 to 4 mm (unclear significance).   LUL 6 mm  ground glass opacity (new).  Chest CT on 12/26/2019 revealed stable sub-cm bilateral pulmonary nodules c/w benign etiology.    There was a new airspace disease in inferior right middle lobe, with a few adjacent subcentimeter nodules.    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  Patient is followed by Dr Mortimer Fries in pulmonary medicine.   6.   Day 1 Rituxan 1000 mg today. 7.   Leave of absence letter for work per patient request. 8.   RTC on 07/24/2020 for labs (CBC). 9.   RTC on 07/29/2020 for MD assessment, labs (CBC with diff, CMP, LDH) and day 15 Rituxan.  I discussed the assessment and treatment plan with the patient.  The patient was provided an opportunity to ask questions and all were answered.  The patient agreed with the plan and demonstrated an understanding of the instructions.  The patient was advised to call back or seek an in person evaluation if the symptoms worsen or if the condition fails to improve as anticipated.   Nolon Stalls, MD, PhD  07/15/2020, 9:24 AM  I, Mirian Mo Tufford, am acting as Education administrator for [employer]. Mike Gip, MD, PhD.  I, Bobbijo Holst C. Mike Gip, MD, have reviewed the above documentation for accuracy and completeness, and I agree with the above.

## 2020-07-12 ENCOUNTER — Encounter: Payer: Self-pay | Admitting: Hematology and Oncology

## 2020-07-12 NOTE — Progress Notes (Signed)
Patient was called for pre assessment. She states she has been having trouble sleeping due to prednisone. Also states she feels she may be developing a UTI. Denies any pain when urinating. Reports itching and frequency. Duration for about a week. She would like to request a leave of absence from her job until treatment is complete. States she is having a hard time working with no sleep. Denies other concerns at this time.

## 2020-07-15 ENCOUNTER — Encounter: Payer: Self-pay | Admitting: Hematology and Oncology

## 2020-07-15 ENCOUNTER — Inpatient Hospital Stay (HOSPITAL_BASED_OUTPATIENT_CLINIC_OR_DEPARTMENT_OTHER): Payer: BC Managed Care – PPO | Admitting: Hematology and Oncology

## 2020-07-15 ENCOUNTER — Other Ambulatory Visit: Payer: Self-pay

## 2020-07-15 ENCOUNTER — Inpatient Hospital Stay: Payer: BC Managed Care – PPO

## 2020-07-15 VITALS — BP 153/74 | HR 76 | Resp 18

## 2020-07-15 VITALS — BP 166/79 | HR 85 | Resp 18 | Ht 63.0 in | Wt 168.8 lb

## 2020-07-15 DIAGNOSIS — Z8249 Family history of ischemic heart disease and other diseases of the circulatory system: Secondary | ICD-10-CM | POA: Diagnosis not present

## 2020-07-15 DIAGNOSIS — D591 Autoimmune hemolytic anemia, unspecified: Secondary | ICD-10-CM

## 2020-07-15 DIAGNOSIS — Z833 Family history of diabetes mellitus: Secondary | ICD-10-CM | POA: Diagnosis not present

## 2020-07-15 DIAGNOSIS — E538 Deficiency of other specified B group vitamins: Secondary | ICD-10-CM

## 2020-07-15 DIAGNOSIS — R768 Other specified abnormal immunological findings in serum: Secondary | ICD-10-CM

## 2020-07-15 DIAGNOSIS — R911 Solitary pulmonary nodule: Secondary | ICD-10-CM

## 2020-07-15 DIAGNOSIS — Z87891 Personal history of nicotine dependence: Secondary | ICD-10-CM | POA: Diagnosis not present

## 2020-07-15 DIAGNOSIS — B1910 Unspecified viral hepatitis B without hepatic coma: Secondary | ICD-10-CM | POA: Diagnosis present

## 2020-07-15 DIAGNOSIS — Z79899 Other long term (current) drug therapy: Secondary | ICD-10-CM | POA: Diagnosis not present

## 2020-07-15 DIAGNOSIS — R918 Other nonspecific abnormal finding of lung field: Secondary | ICD-10-CM

## 2020-07-15 DIAGNOSIS — Z7902 Long term (current) use of antithrombotics/antiplatelets: Secondary | ICD-10-CM | POA: Diagnosis not present

## 2020-07-15 DIAGNOSIS — Z5112 Encounter for antineoplastic immunotherapy: Secondary | ICD-10-CM

## 2020-07-15 DIAGNOSIS — D5911 Warm autoimmune hemolytic anemia: Secondary | ICD-10-CM | POA: Diagnosis present

## 2020-07-15 LAB — COMPREHENSIVE METABOLIC PANEL
ALT: 29 U/L (ref 0–44)
AST: 16 U/L (ref 15–41)
Albumin: 4.2 g/dL (ref 3.5–5.0)
Alkaline Phosphatase: 52 U/L (ref 38–126)
Anion gap: 9 (ref 5–15)
BUN: 38 mg/dL — ABNORMAL HIGH (ref 8–23)
CO2: 29 mmol/L (ref 22–32)
Calcium: 9.4 mg/dL (ref 8.9–10.3)
Chloride: 101 mmol/L (ref 98–111)
Creatinine, Ser: 1.24 mg/dL — ABNORMAL HIGH (ref 0.44–1.00)
GFR, Estimated: 49 mL/min — ABNORMAL LOW (ref >60)
Glucose, Bld: 123 mg/dL — ABNORMAL HIGH (ref 70–99)
Potassium: 4.3 mmol/L (ref 3.5–5.1)
Sodium: 139 mmol/L (ref 135–145)
Total Bilirubin: 1.1 mg/dL (ref 0.3–1.2)
Total Protein: 7.1 g/dL (ref 6.5–8.1)

## 2020-07-15 LAB — CBC WITH DIFFERENTIAL/PLATELET
Abs Immature Granulocytes: 0.05 10*3/uL (ref 0.00–0.07)
Basophils Absolute: 0 10*3/uL (ref 0.0–0.1)
Basophils Relative: 0 %
Eosinophils Absolute: 0 10*3/uL (ref 0.0–0.5)
Eosinophils Relative: 1 %
HCT: 40.1 % (ref 36.0–46.0)
Hemoglobin: 13.1 g/dL (ref 12.0–15.0)
Immature Granulocytes: 1 %
Lymphocytes Relative: 5 %
Lymphs Abs: 0.4 10*3/uL — ABNORMAL LOW (ref 0.7–4.0)
MCH: 32.7 pg (ref 26.0–34.0)
MCHC: 32.7 g/dL (ref 30.0–36.0)
MCV: 100 fL (ref 80.0–100.0)
Monocytes Absolute: 0.5 10*3/uL (ref 0.1–1.0)
Monocytes Relative: 7 %
Neutro Abs: 6.4 10*3/uL (ref 1.7–7.7)
Neutrophils Relative %: 86 %
Platelets: 100 10*3/uL — ABNORMAL LOW (ref 150–400)
RBC: 4.01 MIL/uL (ref 3.87–5.11)
RDW: 13.7 % (ref 11.5–15.5)
WBC: 7.4 10*3/uL (ref 4.0–10.5)
nRBC: 0 % (ref 0.0–0.2)

## 2020-07-15 LAB — LACTATE DEHYDROGENASE: LDH: 262 U/L — ABNORMAL HIGH (ref 98–192)

## 2020-07-15 MED ORDER — ACETAMINOPHEN 325 MG PO TABS
650.0000 mg | ORAL_TABLET | Freq: Once | ORAL | Status: AC
  Administered 2020-07-15: 650 mg via ORAL

## 2020-07-15 MED ORDER — SODIUM CHLORIDE 0.9 % IV SOLN
1000.0000 mg | Freq: Once | INTRAVENOUS | Status: AC
  Administered 2020-07-15: 1000 mg via INTRAVENOUS
  Filled 2020-07-15: qty 100

## 2020-07-15 MED ORDER — DIPHENHYDRAMINE HCL 50 MG/ML IJ SOLN
INTRAMUSCULAR | Status: AC
  Filled 2020-07-15: qty 1

## 2020-07-15 MED ORDER — SODIUM CHLORIDE 0.9 % IV SOLN
Freq: Once | INTRAVENOUS | Status: AC
  Filled 2020-07-15: qty 250

## 2020-07-15 MED ORDER — ACETAMINOPHEN 325 MG PO TABS
ORAL_TABLET | ORAL | Status: AC
  Filled 2020-07-15: qty 2

## 2020-07-15 MED ORDER — DIPHENHYDRAMINE HCL 50 MG/ML IJ SOLN
50.0000 mg | Freq: Once | INTRAMUSCULAR | Status: AC
  Administered 2020-07-15: 50 mg via INTRAVENOUS

## 2020-07-15 NOTE — Patient Instructions (Addendum)
  Decrease prednisone to 40 mg a day.  Lab check on 07/24/2020.  If labs good, decrease to 30 mg a day.to [facility]

## 2020-07-15 NOTE — Progress Notes (Signed)
Pt tolerated initial rituxan (has had over 11mo ago), restarted as initial tx, VSS, pt d/ced home

## 2020-07-15 NOTE — Progress Notes (Signed)
No new changes noted.

## 2020-07-16 ENCOUNTER — Emergency Department: Payer: BC Managed Care – PPO

## 2020-07-16 ENCOUNTER — Inpatient Hospital Stay
Admission: EM | Admit: 2020-07-16 | Discharge: 2020-07-18 | DRG: 872 | Disposition: A | Discharge status: Home / Self Care | Payer: BC Managed Care – PPO | Attending: Internal Medicine | Admitting: Internal Medicine

## 2020-07-16 ENCOUNTER — Telehealth: Payer: Self-pay | Admitting: *Deleted

## 2020-07-16 ENCOUNTER — Other Ambulatory Visit: Payer: Self-pay

## 2020-07-16 ENCOUNTER — Encounter: Payer: Self-pay | Admitting: Internal Medicine

## 2020-07-16 ENCOUNTER — Encounter: Payer: Self-pay | Admitting: Hematology and Oncology

## 2020-07-16 ENCOUNTER — Telehealth: Payer: Self-pay

## 2020-07-16 ENCOUNTER — Other Ambulatory Visit: Payer: Self-pay | Admitting: Hematology and Oncology

## 2020-07-16 DIAGNOSIS — I739 Peripheral vascular disease, unspecified: Secondary | ICD-10-CM | POA: Diagnosis present

## 2020-07-16 DIAGNOSIS — R918 Other nonspecific abnormal finding of lung field: Secondary | ICD-10-CM | POA: Diagnosis present

## 2020-07-16 DIAGNOSIS — Z20822 Contact with and (suspected) exposure to covid-19: Secondary | ICD-10-CM | POA: Diagnosis present

## 2020-07-16 DIAGNOSIS — J449 Chronic obstructive pulmonary disease, unspecified: Secondary | ICD-10-CM | POA: Diagnosis present

## 2020-07-16 DIAGNOSIS — D696 Thrombocytopenia, unspecified: Secondary | ICD-10-CM

## 2020-07-16 DIAGNOSIS — Z8249 Family history of ischemic heart disease and other diseases of the circulatory system: Secondary | ICD-10-CM

## 2020-07-16 DIAGNOSIS — R7881 Bacteremia: Principal | ICD-10-CM | POA: Diagnosis present

## 2020-07-16 DIAGNOSIS — R509 Fever, unspecified: Secondary | ICD-10-CM | POA: Diagnosis not present

## 2020-07-16 DIAGNOSIS — D591 Autoimmune hemolytic anemia, unspecified: Secondary | ICD-10-CM | POA: Diagnosis not present

## 2020-07-16 DIAGNOSIS — R202 Paresthesia of skin: Secondary | ICD-10-CM

## 2020-07-16 DIAGNOSIS — Z7952 Long term (current) use of systemic steroids: Secondary | ICD-10-CM

## 2020-07-16 DIAGNOSIS — E785 Hyperlipidemia, unspecified: Secondary | ICD-10-CM | POA: Diagnosis present

## 2020-07-16 DIAGNOSIS — M25561 Pain in right knee: Secondary | ICD-10-CM

## 2020-07-16 DIAGNOSIS — N179 Acute kidney failure, unspecified: Secondary | ICD-10-CM | POA: Diagnosis present

## 2020-07-16 DIAGNOSIS — I10 Essential (primary) hypertension: Secondary | ICD-10-CM | POA: Diagnosis present

## 2020-07-16 DIAGNOSIS — B1920 Unspecified viral hepatitis C without hepatic coma: Secondary | ICD-10-CM | POA: Diagnosis present

## 2020-07-16 DIAGNOSIS — Z7902 Long term (current) use of antithrombotics/antiplatelets: Secondary | ICD-10-CM

## 2020-07-16 DIAGNOSIS — Z79890 Hormone replacement therapy (postmenopausal): Secondary | ICD-10-CM

## 2020-07-16 DIAGNOSIS — L930 Discoid lupus erythematosus: Secondary | ICD-10-CM | POA: Diagnosis present

## 2020-07-16 DIAGNOSIS — M25562 Pain in left knee: Secondary | ICD-10-CM | POA: Diagnosis present

## 2020-07-16 DIAGNOSIS — N390 Urinary tract infection, site not specified: Secondary | ICD-10-CM | POA: Diagnosis present

## 2020-07-16 DIAGNOSIS — D649 Anemia, unspecified: Secondary | ICD-10-CM

## 2020-07-16 DIAGNOSIS — Z79899 Other long term (current) drug therapy: Secondary | ICD-10-CM

## 2020-07-16 DIAGNOSIS — K219 Gastro-esophageal reflux disease without esophagitis: Secondary | ICD-10-CM | POA: Diagnosis present

## 2020-07-16 DIAGNOSIS — Z886 Allergy status to analgesic agent status: Secondary | ICD-10-CM

## 2020-07-16 DIAGNOSIS — N3000 Acute cystitis without hematuria: Secondary | ICD-10-CM | POA: Diagnosis not present

## 2020-07-16 DIAGNOSIS — B9620 Unspecified Escherichia coli [E. coli] as the cause of diseases classified elsewhere: Secondary | ICD-10-CM | POA: Diagnosis present

## 2020-07-16 DIAGNOSIS — E039 Hypothyroidism, unspecified: Secondary | ICD-10-CM | POA: Diagnosis present

## 2020-07-16 DIAGNOSIS — Z87891 Personal history of nicotine dependence: Secondary | ICD-10-CM

## 2020-07-16 DIAGNOSIS — I2510 Atherosclerotic heart disease of native coronary artery without angina pectoris: Secondary | ICD-10-CM | POA: Diagnosis present

## 2020-07-16 DIAGNOSIS — D5911 Warm autoimmune hemolytic anemia: Secondary | ICD-10-CM | POA: Diagnosis present

## 2020-07-16 DIAGNOSIS — Z885 Allergy status to narcotic agent status: Secondary | ICD-10-CM

## 2020-07-16 DIAGNOSIS — Z95828 Presence of other vascular implants and grafts: Secondary | ICD-10-CM

## 2020-07-16 DIAGNOSIS — E860 Dehydration: Secondary | ICD-10-CM

## 2020-07-16 DIAGNOSIS — I119 Hypertensive heart disease without heart failure: Secondary | ICD-10-CM | POA: Diagnosis present

## 2020-07-16 DIAGNOSIS — Z881 Allergy status to other antibiotic agents status: Secondary | ICD-10-CM

## 2020-07-16 LAB — COMPREHENSIVE METABOLIC PANEL
ALT: 25 U/L (ref 0–44)
AST: 20 U/L (ref 15–41)
Albumin: 3.6 g/dL (ref 3.5–5.0)
Alkaline Phosphatase: 50 U/L (ref 38–126)
Anion gap: 8 (ref 5–15)
BUN: 38 mg/dL — ABNORMAL HIGH (ref 8–23)
CO2: 29 mmol/L (ref 22–32)
Calcium: 8.8 mg/dL — ABNORMAL LOW (ref 8.9–10.3)
Chloride: 99 mmol/L (ref 98–111)
Creatinine, Ser: 1.48 mg/dL — ABNORMAL HIGH (ref 0.44–1.00)
GFR, Estimated: 40 mL/min — ABNORMAL LOW (ref >60)
Glucose, Bld: 128 mg/dL — ABNORMAL HIGH (ref 70–99)
Potassium: 4 mmol/L (ref 3.5–5.1)
Sodium: 136 mmol/L (ref 135–145)
Total Bilirubin: 1.5 mg/dL — ABNORMAL HIGH (ref 0.3–1.2)
Total Protein: 6.5 g/dL (ref 6.5–8.1)

## 2020-07-16 LAB — CBC WITH DIFFERENTIAL/PLATELET
Abs Immature Granulocytes: 0.04 10*3/uL (ref 0.00–0.07)
Basophils Absolute: 0 10*3/uL (ref 0.0–0.1)
Basophils Relative: 0 %
Eosinophils Absolute: 0 10*3/uL (ref 0.0–0.5)
Eosinophils Relative: 1 %
HCT: 36.4 % (ref 36.0–46.0)
Hemoglobin: 11.6 g/dL — ABNORMAL LOW (ref 12.0–15.0)
Immature Granulocytes: 1 %
Lymphocytes Relative: 7 %
Lymphs Abs: 0.4 10*3/uL — ABNORMAL LOW (ref 0.7–4.0)
MCH: 32.2 pg (ref 26.0–34.0)
MCHC: 31.9 g/dL (ref 30.0–36.0)
MCV: 101.1 fL — ABNORMAL HIGH (ref 80.0–100.0)
Monocytes Absolute: 0.4 10*3/uL (ref 0.1–1.0)
Monocytes Relative: 7 %
Neutro Abs: 4.9 10*3/uL (ref 1.7–7.7)
Neutrophils Relative %: 84 %
Platelets: 95 10*3/uL — ABNORMAL LOW (ref 150–400)
RBC: 3.6 MIL/uL — ABNORMAL LOW (ref 3.87–5.11)
RDW: 13.9 % (ref 11.5–15.5)
WBC: 5.8 10*3/uL (ref 4.0–10.5)
nRBC: 0 % (ref 0.0–0.2)

## 2020-07-16 LAB — URINALYSIS, COMPLETE (UACMP) WITH MICROSCOPIC
Bilirubin Urine: NEGATIVE
Glucose, UA: NEGATIVE mg/dL
Ketones, ur: NEGATIVE mg/dL
Nitrite: POSITIVE — AB
Protein, ur: 100 mg/dL — AB
Specific Gravity, Urine: 1.014 (ref 1.005–1.030)
Squamous Epithelial / HPF: NONE SEEN (ref 0–5)
WBC, UA: >50 WBC/hpf — ABNORMAL HIGH (ref 0–5)
pH: 6 (ref 5.0–8.0)

## 2020-07-16 LAB — RESPIRATORY PANEL BY RT PCR (FLU A&B, COVID)
Influenza A by PCR: NEGATIVE
Influenza B by PCR: NEGATIVE
SARS Coronavirus 2 by RT PCR: NEGATIVE

## 2020-07-16 LAB — MAGNESIUM: Magnesium: 1.7 mg/dL (ref 1.7–2.4)

## 2020-07-16 LAB — LACTATE DEHYDROGENASE: LDH: 267 U/L — ABNORMAL HIGH (ref 98–192)

## 2020-07-16 LAB — PATHOLOGIST SMEAR REVIEW

## 2020-07-16 LAB — CK: Total CK: 18 U/L — ABNORMAL LOW (ref 38–234)

## 2020-07-16 MED ORDER — FOLIC ACID 1 MG PO TABS
1.0000 mg | ORAL_TABLET | Freq: Every day | ORAL | Status: DC
  Filled 2020-07-16: qty 1

## 2020-07-16 MED ORDER — ALLOPURINOL 100 MG PO TABS
100.0000 mg | ORAL_TABLET | Freq: Every day | ORAL | Status: DC
  Administered 2020-07-17 – 2020-07-18 (×2): 100 mg via ORAL
  Filled 2020-07-16 (×2): qty 1

## 2020-07-16 MED ORDER — ACETAMINOPHEN 500 MG PO TABS
1000.0000 mg | ORAL_TABLET | Freq: Once | ORAL | Status: AC
  Administered 2020-07-16: 1000 mg via ORAL
  Filled 2020-07-16: qty 2

## 2020-07-16 MED ORDER — PREDNISONE 20 MG PO TABS
40.0000 mg | ORAL_TABLET | Freq: Every day | ORAL | Status: DC
  Administered 2020-07-17 – 2020-07-18 (×2): 40 mg via ORAL
  Filled 2020-07-16 (×2): qty 2

## 2020-07-16 MED ORDER — CLOPIDOGREL BISULFATE 75 MG PO TABS
75.0000 mg | ORAL_TABLET | Freq: Every day | ORAL | Status: DC
  Administered 2020-07-17 – 2020-07-18 (×2): 75 mg via ORAL
  Filled 2020-07-16 (×2): qty 1

## 2020-07-16 MED ORDER — ACETAMINOPHEN 650 MG RE SUPP: 650.0000 mg | Freq: Four times a day (QID) | RECTAL | Status: DC | PRN

## 2020-07-16 MED ORDER — ACETAMINOPHEN 325 MG PO TABS
650.0000 mg | ORAL_TABLET | Freq: Four times a day (QID) | ORAL | Status: DC | PRN
  Administered 2020-07-17 (×2): 650 mg via ORAL
  Filled 2020-07-16 (×2): qty 2

## 2020-07-16 MED ORDER — ENTECAVIR 0.5 MG PO TABS: 0.5000 mg | ORAL_TABLET | ORAL | Status: DC

## 2020-07-16 MED ORDER — PRAVASTATIN SODIUM 20 MG PO TABS
40.0000 mg | ORAL_TABLET | Freq: Every evening | ORAL | Status: DC
  Administered 2020-07-16 – 2020-07-17 (×2): 40 mg via ORAL
  Filled 2020-07-16 (×2): qty 2
  Filled 2020-07-16: qty 1

## 2020-07-16 MED ORDER — SODIUM CHLORIDE 0.9 % IV SOLN: INTRAVENOUS | Status: DC

## 2020-07-16 MED ORDER — LEVOTHYROXINE SODIUM 50 MCG PO TABS
75.0000 ug | ORAL_TABLET | Freq: Every day | ORAL | Status: DC
  Administered 2020-07-17 – 2020-07-18 (×2): 75 ug via ORAL
  Filled 2020-07-16 (×2): qty 1

## 2020-07-16 MED ORDER — LACTATED RINGERS IV BOLUS
500.0000 mL | Freq: Once | INTRAVENOUS | Status: AC
  Administered 2020-07-16: 500 mL via INTRAVENOUS

## 2020-07-16 MED ORDER — SODIUM CHLORIDE 0.9 % IV SOLN
1.0000 g | INTRAVENOUS | Status: DC
  Filled 2020-07-16: qty 10

## 2020-07-16 MED ORDER — ONDANSETRON HCL 4 MG PO TABS: 4.0000 mg | ORAL_TABLET | Freq: Four times a day (QID) | ORAL | Status: DC | PRN

## 2020-07-16 MED ORDER — SODIUM CHLORIDE 0.9 % IV SOLN
1.0000 g | Freq: Once | INTRAVENOUS | Status: AC
  Administered 2020-07-16: 1 g via INTRAVENOUS
  Filled 2020-07-16: qty 10

## 2020-07-16 MED ORDER — TIOTROPIUM BROMIDE MONOHYDRATE 18 MCG IN CAPS
1.0000 | ORAL_CAPSULE | Freq: Every day | RESPIRATORY_TRACT | Status: DC
  Administered 2020-07-17 – 2020-07-18 (×2): 18 ug via RESPIRATORY_TRACT
  Filled 2020-07-16 (×2): qty 5

## 2020-07-16 MED ORDER — LACTATED RINGERS IV BOLUS
1000.0000 mL | Freq: Once | INTRAVENOUS | Status: AC
  Administered 2020-07-16: 1000 mL via INTRAVENOUS

## 2020-07-16 MED ORDER — ONDANSETRON HCL 4 MG/2ML IJ SOLN: 4.0000 mg | Freq: Four times a day (QID) | INTRAMUSCULAR | Status: DC | PRN

## 2020-07-16 MED ORDER — CYANOCOBALAMIN 500 MCG PO TABS
500.0000 ug | ORAL_TABLET | Freq: Every day | ORAL | Status: DC
  Administered 2020-07-17 – 2020-07-18 (×2): 500 ug via ORAL
  Filled 2020-07-16 (×2): qty 1

## 2020-07-16 MED ORDER — PANTOPRAZOLE SODIUM 40 MG PO TBEC
40.0000 mg | DELAYED_RELEASE_TABLET | Freq: Every day | ORAL | Status: DC
  Administered 2020-07-17 – 2020-07-18 (×2): 40 mg via ORAL
  Filled 2020-07-16 (×2): qty 1

## 2020-07-16 MED ORDER — ALBUTEROL SULFATE (2.5 MG/3ML) 0.083% IN NEBU: 2.5000 mg | INHALATION_SOLUTION | Freq: Four times a day (QID) | RESPIRATORY_TRACT | Status: DC | PRN

## 2020-07-16 NOTE — Telephone Encounter (Signed)
Patient called reporting that she had Rituxan yesterday and that her temp has been 101.9 all night and currently as well. She also reports that her legs are numb and it is difficult to walk. Please advise

## 2020-07-16 NOTE — H&P (Signed)
History and Physical    Tina Page [id number] DOB: June 15, 1956 DOA: 07/16/2020  PCP: Frazier Richards, MD   Patient coming from: Home I have personally briefly reviewed patient's old medical records in [place]  Chief Complaint: Fever  HPI: Tina Page is a 64 y.o. female with medical history significant for coronary artery disease, COPD, HTN, hypothyroidism, hepatitis C, PVD status post lower extremity stents and hemolytic anemia currently undergoing rituximab infusions who presents to the ER for evaluation of fever.  Patient received her rituximab infusion on 07/15/20 and states that she developed a fever with a T-max of 101 when she went home associated with paresthesia in both legs which appears to be chronic.  She denies having any falls, no abdominal pain, no shortness of breath, no cough, no nausea, no vomiting, no frequency of urination or dysuria. She is vaccinated against the COVID-19 virus.  Labs show sodium 136, potassium 4.0, chloride 99, bicarb 29, BUN 38, creatinine 1.48, calcium 8.8, magnesium 1.7, alkaline phosphatase 50, albumin 3.6, AST 20, ALT 25, total protein 6.5, total CK 18, LDH 267, white count 5.8, hemoglobin 11.6, hematocrit 36.4, MCV 101, RDW 13.9, platelet count 95,000 Respiratory viral panel is negative Patient has pyuria and has large leukocyte esterase and positive nitrates Chest x-ray reviewed by me shows no active cardiopulmonary disease Twelve-lead EKG reviewed by me shows sinus tachycardia.   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  She is noted to have pyuria and will be placed in observation.  Oncology has been consulted.  Review of Systems: As per HPI otherwise 10 point review of systems negative.    Past Medical History:  Diagnosis Date  . Anemia   . Atherosclerosis of native arteries of extremity with  intermittent claudication ([place]) 07/20/2016  . COPD (chronic obstructive pulmonary disease) ([place])   . 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 Elevated liver function tests 11/03/2017  . GERD (gastroesophageal reflux disease)   . Heme positive stool 01/29/2015  . Hepatitis C 07/12/2016  . HLD (hyperlipidemia)   . Hypertension   . Hypothyroidism   . 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 Thrombocytopenia ([place]) 10/04/2016    Past Surgical History:  Procedure Laterality Date  . ELECTROMAGNETIC NAVIGATION BROCHOSCOPY N/A 12/07/2017   Procedure: ELECTROMAGNETIC NAVIGATION BRONCHOSCOPY;  Surgeon: Flora Lipps, MD;  Location: [facility] ORS;  Service: Cardiopulmonary;  Laterality: N/A;  . ESOPHAGOGASTRODUODENOSCOPY (EGD) WITH PROPOFOL N/A 07/08/2016   Procedure: ESOPHAGOGASTRODUODENOSCOPY (EGD) WITH PROPOFOL;  Surgeon: Manya Silvas, MD;  Location[facility] ENDOSCOPY;  Service: Endoscopy;  Laterality: N/A;  . KNEE SURGERY Right    repair of acl tear  . PERIPHERAL VASCULAR CATHETERIZATION N/A 07/10/2016   Procedure: Lower Extremity Angiography;  Surgeon: Katha Cabal, MD;  Location: [place] CV LAB;  Service: Cardiovascular;  Laterality: N/A;  . PERIPHERAL VASCULAR CATHETERIZATION N/A 07/10/2016   Procedure: Abdominal Aortogram w/Lower Extremity;  Surgeon: Katha Cabal, MD;  Location: [place] [facility] LAB;  Service: Cardiovascular;  Laterality: N/A;  . PERIPHERAL VASCULAR CATHETERIZATION  07/10/2016   Procedure: Lower Extremity Intervention;  Surgeon: Katha Cabal, MD;  Location: [place] CV LAB;  Service: Cardiovascular;;     reports that she quit smoking about 4 years ago. Her smoking use included cigarettes. She has a 58.75 pack-year smoking history. She has never used smokeless tobacco.  She reports that she does not drink alcohol and does not use drugs.  Allergies  Allergen Reactions  . Ciprofloxacin Swelling    Facial swelling following  single oral dose.   . Codeine Anaphylaxis  . Nsaids Other (See Comments)    PT HAS HEPC    Family History  Problem Relation Age of Onset  . Diabetes Mother   . Hypertension Mother   . Diabetes Maternal Grandfather   . Hypertension Maternal Grandfather   . Breast cancer Neg Hx      Prior to Admission medications   Medication Sig Start Date End Date Taking? Authorizing Provider  allopurinol (ZYLOPRIM) 100 MG tablet TAKE 1 TABLET BY MOUTH EVERY DAY Patient taking differently: Take 100 mg by mouth daily.  06/02/19  Yes Lequita Asal, MD  clopidogrel (PLAVIX) 75 MG tablet Take 1 tablet (75 mg total) by mouth daily. 07/13/16  Yes Theodoro Grist, MD  cyanocobalamin 500 MCG tablet Take 500 mcg by mouth daily.   Yes [provider]  entecavir (BARACLUDE) 0.5 MG tablet Take by mouth. 06/26/20  Yes [provider]  folic acid (FOLVITE) 1 MG tablet TAKE 1 TABLET (1 MG TOTAL) BY MOUTH DAILY. 08/29/18  Yes Corcoran, Drue Second, MD  hydrochlorothiazide (HYDRODIURIL) 25 MG tablet Take 25 mg by mouth daily. 02/25/17  Yes [provider]  levothyroxine (SYNTHROID, LEVOTHROID) 75 MCG tablet Take 75 mcg by mouth daily before breakfast.  12/30/17  Yes [provider]  lisinopril (PRINIVIL,ZESTRIL) 20 MG tablet Take 20 mg by mouth daily.  12/23/16  Yes [provider]  omeprazole (PRILOSEC) 20 MG capsule Take 1 capsule (20 mg total) by mouth daily. 08/02/16  Yes Fritzi Mandes, MD  potassium chloride (KLOR-CON) 20 MEQ packet Take 20 mEq by mouth daily.   Yes [provider]  pravastatin (PRAVACHOL) 40 MG tablet Take 40 mg by mouth every evening.   Yes [provider]  predniSONE (DELTASONE) 20 MG tablet TAKE 4 TABLETS BY MOUTH ONCE DAILY WITH BREAKFAST Patient taking differently: Take 80 mg by mouth daily with breakfast.  07/09/20  Yes Corcoran, Drue Second, MD  Tiotropium Bromide Monohydrate (SPIRIVA RESPIMAT) 2.5 MCG/ACT AERS Inhale 2 puffs into the  lungs daily for 1 day. 05/27/20 07/16/20 Yes Martyn Ehrich, NP  Tiotropium Bromide-Olodaterol (STIOLTO RESPIMAT) 2.5-2.5 MCG/ACT AERS Inhale 2 puffs into the lungs daily.   Yes [provider]  albuterol (PROVENTIL HFA;VENTOLIN HFA) 108 (90 Base) MCG/ACT inhaler Inhale 2 puffs into the lungs every 6 (six) hours as needed for wheezing or shortness of breath.     [provider]    Physical Exam: Vitals:   07/16/20 1545 07/16/20 1600 07/16/20 1615 07/16/20 1630  BP:      Pulse: 79 80 73 84  Resp: 20 18 14 18   Temp:      TempSrc:      SpO2: 97% 97% 98% 95%  Weight:      Height:         Vitals:   07/16/20 1545 07/16/20 1600 07/16/20 1615 07/16/20 1630  BP:      Pulse: 79 80 73 84  Resp: 20 18 14 18   Temp:      TempSrc:      SpO2: 97% 97% 98% 95%  Weight:      Height:        Constitutional: NAD, alert and oriented x 3 Eyes: PERRL, lids and conjunctivae normal ENMT: Mucous membranes are moist.  Neck: normal,  supple, no masses, no thyromegaly Respiratory: clear to auscultation bilaterally, no wheezing, no crackles. Normal respiratory effort. No accessory muscle use.  Cardiovascular: Tachycardia, no murmurs / rubs / gallops. No extremity edema. 2+ pedal pulses. No carotid bruits.  Abdomen: no tenderness, no masses palpated. No hepatosplenomegaly. Bowel sounds positive.  Musculoskeletal: no clubbing / cyanosis. No joint deformity upper and lower extremities.  Skin: no rashes, lesions, ulcers.  Neurologic: No gross focal neurologic deficit. Psychiatric: Normal mood and affect.   Labs on Admission: I have personally reviewed following labs and imaging studies  CBC: Recent Labs  Lab 07/15/20 0846 07/16/20 1213  WBC 7.4 5.8  NEUTROABS 6.4 4.9  HGB 13.1 11.6*  HCT 40.1 36.4  MCV 100.0 101.1*  PLT 100* 95*   Basic Metabolic Panel: Recent Labs  Lab 07/15/20 0846 07/16/20 1213  NA 139 136  K 4.3 4.0  CL 101 99  CO2 29 29  GLUCOSE 123* 128*  BUN  38* 38*  CREATININE 1.24* 1.48*  CALCIUM 9.4 8.8*  MG  --  1.7   GFR: Estimated Creatinine Clearance: 38.5 mL/min (A) (by C-G formula based on SCr of 1.48 mg/dL (H)). Liver Function Tests: Recent Labs  Lab 07/15/20 0846 07/16/20 1213  AST 16 20  ALT 29 25  ALKPHOS 52 50  BILITOT 1.1 1.5*  PROT 7.1 6.5  ALBUMIN 4.2 3.6   No results for input(s): LIPASE, AMYLASE in the last 168 hours. No results for input(s): AMMONIA in the last 168 hours. Coagulation Profile: No results for input(s): INR, PROTIME in the last 168 hours. Cardiac Enzymes: Recent Labs  Lab 07/16/20 1213  CKTOTAL 18*   BNP (last 3 results) No results for input(s): PROBNP in the last 8760 hours. HbA1C: No results for input(s): HGBA1C in the last 72 hours. CBG: No results for input(s): GLUCAP in the last 168 hours. Lipid Profile: No results for input(s): CHOL, HDL, LDLCALC, TRIG, CHOLHDL, LDLDIRECT in the last 72 hours. Thyroid Function Tests: No results for input(s): TSH, T4TOTAL, FREET4, T3FREE, THYROIDAB in the last 72 hours. Anemia Panel: No results for input(s): VITAMINB12, FOLATE, FERRITIN, TIBC, IRON, RETICCTPCT in the last 72 hours. Urine analysis:    Component Value Date/Time   COLORURINE YELLOW (A) 07/16/2020 1213   APPEARANCEUR TURBID (A) 07/16/2020 1213   LABSPEC 1.014 07/16/2020 1213   PHURINE 6.0 07/16/2020 1213   GLUCOSEU NEGATIVE 07/16/2020 1213   HGBUR MODERATE (A) 07/16/2020 1213   BILIRUBINUR NEGATIVE 07/16/2020 1213   KETONESUR NEGATIVE 07/16/2020 1213   PROTEINUR 100 (A) 07/16/2020 1213   NITRITE POSITIVE (A) 07/16/2020 1213   LEUKOCYTESUR LARGE (A) 07/16/2020 1213    Radiological Exams on Admission: DG Chest 2 View  Result Date: 07/16/2020 CLINICAL DATA:  Fever. EXAM: CHEST - 2 VIEW COMPARISON:  05/27/2020 FINDINGS: The heart size and mediastinal contours are within normal limits. Both lungs are clear. The visualized skeletal structures are unremarkable. IMPRESSION: No active  cardiopulmonary disease. Electronically Signed   By: Franchot Gallo M.D.   On: 07/16/2020 12:55    EKG: Independently reviewed.   Sinus tachycardia  Assessment/Plan Principal Problem:   UTI (urinary tract infection) Active Problems:   HTN (hypertension)   PAD (peripheral artery disease) (HCC)   Thrombocytopenia (HCC)   Autoimmune hemolytic anemia (HCC)   Current chronic use of systemic steroids   Hypothyroidism    Urinary tract infection Patient has pyuria We will treat empirically with Rocephin until urine culture results become available   History of peripheral  arterial disease Continue Plavix and statins   Hypothyroidism Continue Synthroid   Autoimmune hemolytic anemia/thrombocytopenia Patient is on rituximab infusion and is also on Entecavir to prevent reactivation of hepatitis B. Per Oncology recommendation, dose of anticoagulant has been reduced to 0.5 mg every other day  Continue prednisone 40 mg daily   COPD Continue as needed bronchodilator therapy Continue Spiriva    DVT prophylaxis: SCD Code Status: Full code Family Communication: Greater than 50% of time was spent discussing plan of care with patient and her daughter at the bedside. All questions and concerns have been addressed.  They verbalized understanding and agree with the plan Disposition Plan: Back to previous home environment Consults called: Oncology    Collier Bullock MD [facility]     07/16/2020, 5:29 PM

## 2020-07-16 NOTE — ED Notes (Signed)
Pt alert  Waiting on bed assignment.

## 2020-07-16 NOTE — ED Provider Notes (Signed)
[facility] Emergency Department Provider Note  ____________________________________________   First MD Initiated Contact with Patient 07/16/20 1155     (approximate)  I have reviewed the triage vital signs and the nursing notes.   HISTORY  Chief Complaint Medication Reaction   HPI Tina Page is a 64 y.o. female with a past medical history of CAD, COPD, HTN, HDL, hypothyroidism, hep C, thrombocytopenia, PVD status post lower extremity stents and hemolytic anemia currently undergoing rituximab infusions who presents for assessment of fever elevated at 101 last night after she received an infusion yesterday as well as some pain in her bilateral knees and "numbness" in both legs.  Patient denies any recent falls or injuries, chills, headache, earache, sore throat, vertigo, chest pain, cough, shortness of breath abdominal pain, nausea, vomiting, diarrhea, dysuria, rash, blood in urine, blood in her stool, or other acute complaints.  No prior similar signs.  No clear alleviating aggravating factors.         Past Medical History:  Diagnosis Date  . Anemia   . 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 COPD (chronic obstructive pulmonary disease) ([place])   . 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 Elevated liver function tests 11/03/2017  . GERD (gastroesophageal reflux disease)   . Heme positive stool 01/29/2015  . Hepatitis C 07/12/2016  . HLD (hyperlipidemia)   . Hypertension   . Hypothyroidism   . 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 Thrombocytopenia ([place]) 10/04/2016    Patient Active Problem List   Diagnosis Date Noted  . Encounter for antineoplastic immunotherapy 07/15/2020  . Goals of care, counseling/discussion 06/19/2020  . Abnormal CT of the chest 06/04/2020  . Oral candidiasis 06/04/2020  . Former smoker 06/04/2020  . Pulmonary nodules 12/28/2019   . Postop check 04/24/2019  . Paronychia of great toe of right foot 04/17/2019  . Ingrowing nail 04/17/2019  . Medication monitoring encounter 03/25/2018  . 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 Hepatitis C antibody test positive 03/04/2018  . Enteritis, enteropathogenic E. coli 02/18/2018  . Aortic atherosclerosis ([place]) 12/13/2017  . Shortness of breath 12/13/2017  . Nodule of middle lobe of right lung 11/23/2017  . Renal insufficiency 11/17/2017  . 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 Elevated liver function tests 11/03/2017  . Elevated uric acid in blood 11/03/2017  . Low serum cortisol level ([place]) 01/04/2017  . Elevated alkaline phosphatase level 10/25/2016  . Oral herpes simplex infection 10/25/2016  . Current chronic use of systemic steroids 10/23/2016  . Impetigo 10/23/2016  . Thrombocytopenia ([place]) 10/04/2016  . B12 deficiency 08/06/2016  . Symptomatic anemia 07/21/2016  . 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 PAD (peripheral artery disease) ([place]) 07/12/2016  . 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 Hepatitis B core antibody positive 07/12/2016  . Tobacco abuse 07/12/2016  . Leg pain 07/07/2016  . HTN (hypertension) 07/07/2016  . HLD (hyperlipidemia) 07/07/2016  . COPD (chronic obstructive pulmonary disease) ([place]) 07/07/2016  . Heme positive stool 01/29/2015    Past Surgical History:  Procedure Laterality Date  . ELECTROMAGNETIC NAVIGATION BROCHOSCOPY N/A 12/07/2017   Procedure: ELECTROMAGNETIC NAVIGATION BRONCHOSCOPY;  Surgeon: Flora Lipps, MD;  Location: [facility] ORS;  Service: Cardiopulmonary;  Laterality: N/A;  . ESOPHAGOGASTRODUODENOSCOPY (EGD) WITH PROPOFOL N/A 07/08/2016  Procedure: ESOPHAGOGASTRODUODENOSCOPY (EGD) WITH PROPOFOL;  Surgeon: Manya Silvas, MD;   Location[facility] ENDOSCOPY;  Service: Endoscopy;  Laterality: N/A;  . KNEE SURGERY Right    repair of acl tear  . PERIPHERAL VASCULAR CATHETERIZATION N/A 07/10/2016   Procedure: Lower Extremity Angiography;  Surgeon: Katha Cabal, MD;  Location: [place];  Service: Cardiovascular;  Laterality: N/A;  . PERIPHERAL VASCULAR CATHETERIZATION N/A 07/10/2016   Procedure: Abdominal Aortogram w/Lower Extremity;  Surgeon: Katha Cabal, MD;  Location: [place] [facility];  Service: Cardiovascular;  Laterality: N/A;  . PERIPHERAL VASCULAR CATHETERIZATION  07/10/2016   Procedure: Lower Extremity Intervention;  Surgeon: Katha Cabal, MD;  Location: [facility];  Service: Cardiovascular;;    Prior to Admission medications   Medication Sig Start Date End Date Taking? Authorizing Provider  albuterol (PROVENTIL HFA;VENTOLIN HFA) 108 (90 Base) MCG/ACT inhaler Inhale 2 puffs into the lungs every 6 (six) hours as needed for wheezing or shortness of breath.     [provider]  allopurinol (ZYLOPRIM) 100 MG tablet TAKE 1 TABLET BY MOUTH EVERY DAY 06/02/19   Lequita Asal, MD  clopidogrel (PLAVIX) 75 MG tablet Take 1 tablet (75 mg total) by mouth daily. 07/13/16   Theodoro Grist, MD  cyanocobalamin 500 MCG tablet Take 500 mcg by mouth daily.    [provider]  entecavir (BARACLUDE) 0.5 MG tablet Take by mouth. 06/26/20   [provider]  folic acid (FOLVITE) 1 MG tablet TAKE 1 TABLET (1 MG TOTAL) BY MOUTH DAILY. Patient not taking: Reported on 07/12/2020 08/29/18   Lequita Asal, MD  hydrochlorothiazide (HYDRODIURIL) 25 MG tablet Take 25 mg by mouth daily. 02/25/17   [provider]  levothyroxine (SYNTHROID, LEVOTHROID) 75 MCG tablet Take 75 mcg by mouth daily before breakfast.  12/30/17   [provider]  lisinopril (PRINIVIL,ZESTRIL) 20 MG tablet Take 20 mg by mouth daily.  12/23/16   [provider]  omeprazole  (PRILOSEC) 20 MG capsule Take 1 capsule (20 mg total) by mouth daily. 08/02/16   Fritzi Mandes, MD  potassium chloride (KLOR-CON) 20 MEQ packet Take 20 mEq by mouth daily.    [provider]  pravastatin (PRAVACHOL) 40 MG tablet Take 40 mg by mouth every evening.    [provider]  predniSONE (DELTASONE) 20 MG tablet TAKE 4 TABLETS BY MOUTH ONCE DAILY WITH BREAKFAST 07/09/20   Lequita Asal, MD  Tiotropium Bromide Monohydrate (SPIRIVA RESPIMAT) 2.5 MCG/ACT AERS Inhale 2 puffs into the lungs daily for 1 day. 05/27/20 07/12/20  Martyn Ehrich, NP    Allergies Codeine and Ciprofloxacin  Family History  Problem Relation Age of Onset  . Diabetes Mother   . Hypertension Mother   . Diabetes Maternal Grandfather   . Hypertension Maternal Grandfather   . Breast cancer Neg Hx     Social History Social History   Tobacco Use  . Smoking status: Former Smoker    Packs/day: 1.25    Years: 47.00    Pack years: 58.75    Types: Cigarettes    Quit date: 07/11/2016    Years since quitting: 4.0  . Smokeless tobacco: Never Used  Vaping Use  . Vaping Use: Never used  Substance Use Topics  . Alcohol use: No  . Drug use: No    Review of Systems  Review of Systems  Constitutional: Positive for fever. Negative for chills.  HENT: Negative for sore throat.   Eyes: Negative  for pain.  Respiratory: Negative for cough and stridor.   Cardiovascular: Negative for chest pain.  Gastrointestinal: Negative for vomiting.  Genitourinary: Negative for dysuria.  Musculoskeletal: Negative for myalgias.  Skin: Negative for rash.  Neurological: Positive for tingling ( lower legs and both knees). Negative for seizures, loss of consciousness and headaches.  Psychiatric/Behavioral: Negative for suicidal ideas.  All other systems reviewed and are negative.     ____________________________________________   PHYSICAL EXAM:  VITAL SIGNS: ED Triage Vitals  Enc Vitals Group     BP  07/16/20 1130 (!) 142/62     Pulse Rate 07/16/20 1130 (!) 111     Resp 07/16/20 1130 18     Temp 07/16/20 1130 98.4 F (36.9 C)     Temp Source 07/16/20 1130 Oral     SpO2 07/16/20 1130 99 %     Weight 07/16/20 1131 168 lb (76.2 kg)     Height 07/16/20 1131 5\' 3"  (1.6 m)     Head Circumference --      Peak Flow --      Pain Score 07/16/20 1131 8     Pain Loc --      Pain Edu? --      Excl. in [place]? --    Vitals:   07/16/20 1130  BP: (!) 142/62  Pulse: (!) 111  Resp: 18  Temp: 98.4 F (36.9 C)  SpO2: 99%   Physical Exam Vitals and nursing note reviewed.  Constitutional:      General: She is not in acute distress.    Appearance: She is well-developed.  HENT:     Head: Normocephalic and atraumatic.     Right Ear: External ear normal.     Left Ear: External ear normal.     Mouth/Throat:     Mouth: Mucous membranes are dry.  Eyes:     Conjunctiva/sclera: Conjunctivae normal.  Cardiovascular:     Rate and Rhythm: Regular rhythm. Tachycardia present.     Heart sounds: No murmur heard.   Pulmonary:     Effort: Pulmonary effort is normal. No respiratory distress.     Breath sounds: Normal breath sounds.  Abdominal:     Palpations: Abdomen is soft.     Tenderness: There is no abdominal tenderness.  Musculoskeletal:     Cervical back: Neck supple.  Skin:    General: Skin is warm and dry.     Capillary Refill: Capillary refill takes 2 to 3 seconds.  Neurological:     Mental Status: She is alert and oriented to person, place, and time.  Psychiatric:        Mood and Affect: Mood normal.     2+ bilateral radial and DP pulses.  Patient has full and symmetric strength of bilateral upper and lower extremities.  Sensation is intact to light touch throughout all extremities.  There is no large effusion over line erythema edema or warmth over either knee.  1+ bilateral patellar reflexes. ____________________________________________   LABS (all labs ordered are listed, but only  abnormal results are displayed)  Labs Reviewed  CBC WITH DIFFERENTIAL/PLATELET - Abnormal; Notable for the following components:      Result Value   RBC 3.60 (*)    Hemoglobin 11.6 (*)    MCV 101.1 (*)    Platelets 95 (*)    Lymphs Abs 0.4 (*)    All other components within normal limits  COMPREHENSIVE METABOLIC PANEL - Abnormal; Notable for the following components:   Glucose,  Bld 128 (*)    BUN 38 (*)    Creatinine, Ser 1.48 (*)    Calcium 8.8 (*)    Total Bilirubin 1.5 (*)    GFR, Estimated 40 (*)    All other components within normal limits  URINALYSIS, COMPLETE (UACMP) WITH MICROSCOPIC - Abnormal; Notable for the following components:   Color, Urine YELLOW (*)    APPearance TURBID (*)    Hgb urine dipstick MODERATE (*)    Protein, ur 100 (*)    Nitrite POSITIVE (*)    Leukocytes,Ua LARGE (*)    WBC, UA >50 (*)    Bacteria, UA FEW (*)    All other components within normal limits  CK - Abnormal; Notable for the following components:   Total CK 18 (*)    All other components within normal limits  LACTATE DEHYDROGENASE - Abnormal; Notable for the following components:   LDH 267 (*)    All other components within normal limits  RESPIRATORY PANEL BY RT PCR (FLU A&B, COVID)  URINE CULTURE  CULTURE, BLOOD (ROUTINE X 2)  CULTURE, BLOOD (ROUTINE X 2)  MAGNESIUM  PATHOLOGIST SMEAR REVIEW  HAPTOGLOBIN   ____________________________________________  ____________________________________________  RADIOLOGY  ED MD interpretation: No evidence of focal consolidation, overt edema, effusion, no thorax, or other acute thoracic process.  Official radiology report(s): DG Chest 2 View  Result Date: 07/16/2020 CLINICAL DATA:  Fever. EXAM: CHEST - 2 VIEW COMPARISON:  05/27/2020 FINDINGS: The heart size and mediastinal contours are within normal limits. Both lungs are clear. The visualized skeletal structures are unremarkable. IMPRESSION: No active cardiopulmonary disease.  Electronically Signed   By: Franchot Gallo M.D.   On: 07/16/2020 12:55    ____________________________________________   PROCEDURES  Procedure(s) performed (including Critical Care):  .1-3 Lead EKG Interpretation Performed by: Lucrezia Starch, MD Authorized by: Lucrezia Starch, MD     Interpretation: abnormal     ECG rate assessment: tachycardic     Rhythm: sinus tachycardia     Ectopy: none     Conduction: normal       ____________________________________________   INITIAL IMPRESSION / ASSESSMENT AND PLAN / ED COURSE        Patient presents with [name] to history exam for assessment of fever and bilateral lower extremity paresthesias and weakness as well as some pain in both knees after routine outpatient rituximab infusion for hemolytic anemia yesterday.  On arrival patient is slightly tachycardic with a heart rate of 111, otherwise stable vital signs on room air.  With the patient's fever and lower extremity symptoms it is possible this related to her rituximab infusion although infectious etiologies are also on the differential.  Lower suspicion for acute gout flare no findings on exam to suggest septic knee or acute arterial or venous occlusion in either lower extremity given strong bilateral DP pulses and no evidence of edema.  No focal findings on exam to suggest cellulitis or other clear foci of infection.  Chest x-ray is unremarkable.  UA is consistent with urinary tract infection.  No fever or CVA tenderness to suggest pyelonephritis.  However patient is tachycardic and immunocompromise denies I did order blood cultures and gave 1 dose of Rocephin while patient was in the ED.  CMP remarkable for creatinine of 1.48 compared to 1.24 yesterday and 1.1 712 days ago. No other significant ocular metabolic derangements. CBC remarkable for anemia with hemoglobin of 11.6 compared to 13 yesterday. Patient is known to be thrombocytopenic with platelets of  95 compared to 100  yesterday and 1 3112 days ago. Magnesium and CK unremarkable.  I discussed these findings with on-call hematologist oncologist Dr. Grayland Ormond who recommended labs on hospital service. Antibiotics. Also discussed with patient's oncologist Dr. Mike Gip who states she would see the patient. I will plan to admit to hospital service for further evaluation management.    ____________________________________________   FINAL CLINICAL IMPRESSION(S) / ED DIAGNOSES  Final diagnoses:  Fever, unspecified fever cause  Paresthesia  Thrombocytopenia (HCC)  Anemia, unspecified type  Acute cystitis without hematuria  Dehydration    Medications  cefTRIAXone (ROCEPHIN) 1 g in sodium chloride 0.9 % 100 mL IVPB (has no administration in time range)  lactated ringers bolus 1,000 mL (has no administration in time range)  lactated ringers bolus 500 mL (500 mLs Intravenous New Bag/Given 07/16/20 1234)  acetaminophen (TYLENOL) tablet 1,000 mg (1,000 mg Oral Given 07/16/20 1237)     ED Discharge Orders    None       Note:  This document was prepared using Dragon voice recognition software and may include unintentional dictation errors.   Lucrezia Starch, MD 07/16/20 1351

## 2020-07-16 NOTE — Telephone Encounter (Signed)
Spoke with Ms Tina Page which she informed me that her mother in having extreme pain to bilateral legs with numbness and unable to move. She is also running a fever on 101.9 I have informed her to take her to the ED. I have called the ED and given report to the charge nurse and she informed me to tell the patient to come on over. I have informed the patient daughter to take her over to the ED. She was understanding and agreeable.

## 2020-07-16 NOTE — ED Notes (Signed)
Resumed care from [place] rn.  Pt alert, sitting up eating a lunch tray.  Family with pt.   nsr on monitor.  Pt states no pain.

## 2020-07-16 NOTE — Telephone Encounter (Signed)
Spoke with Ms Tina Page which she informed me that her mother in having extreme pain to bilateral legs with numbness and unable to move. She is also running a fever on 101.9 I have informed her to take her to the ED.

## 2020-07-16 NOTE — ED Triage Notes (Signed)
Pt reports that she received an infusion rutuxin for hemolytic anemia. Pt reports that she is having bilat leg numbness and states that she also has stents in her groin

## 2020-07-16 NOTE — ED Notes (Signed)
Report called to [place] rn floor nurse

## 2020-07-16 NOTE — Consult Note (Signed)
[facility]  Date of admission:  07/16/2020  Inpatient day:  07/16/2020  Consulting physician:  Dr. Collier Bullock   Reason for Consultation:  S/p recent rituximab infusion  Chief Complaint: Tina Page is a 64 y.o. female with warm autoimmune hemolytic anemia who was admitted through the emergency room with fever, chills, change in lower extremity sensation and a UTI following Rituxan infusion.  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  She was initially on a prolonged course of steroids. She completed a course of Rituxan on 06/16/2018 at [facility].  She was on entecavir prophylaxis secondary to hepatitis B unitil 07/06/2019.  She developed a recurrent warm autoimmune hemolytic anemia on 06/12/2020.  Hemoglobin was 8.4.  She was started on 1 mg/kg steroids (80 mg) with a taper every 10 days.  After consultation with [facility], decision was made to proceed with Rituxan 1000 mg on day 1 and 15.  She received Rituxan on 07/15/2020 after reinstitution of entacavir for 2 weeks.  She received Rituxan uneventfully on 07/15/2020.  She states that after she got home, she developed fever (up to 103), chills, and numbness/achiness in her legs from her knees to her toes.  She had difficulty walking.  She took Tylenol last night.  On wakening today, she had a fever of 101.9 and chills.  She continued to have pain and numbness in her legs.  She was concerned about her peripheral vascular disease s/p bilateral stents.  She contacted the clinic and was directed to the emergency room.  Labs revealed a hematocrit of 36.4, hemoglobin 11.6, MCV 101.1, platelets 95,000, WBC 5800 and ANC 4900.  Creatinine was 1.48 (CrCl 38.5 ml/min).  Bilirubin was 1.5.  LDH was 267 and CK 18.  Influenza A, B, and COVID-19 were negative.  Urinalysis revealed moderate hemoglobin, nitrite positive, large leukocytes, > 50 WBC and few bacteria.  Culture is  pending.    Peripheral smear revealed normal morphology of RBCs, WBCs, and platelets.  There was no evidence of circulating blasts or schistocytes.    She was empirically placed on ceftriaxone.  Symptomatically, she still feels achiness from her knees down.  She denies any urinary symptoms.   Past Medical History:  Diagnosis Date  . Anemia   . Atherosclerosis of native arteries of extremity with intermittent claudication ([place]) 07/20/2016  . COPD (chronic obstructive pulmonary disease) ([place])   . 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 Elevated liver function tests 11/03/2017  . GERD (gastroesophageal reflux disease)   . Heme positive stool 01/29/2015  . Hepatitis C 07/12/2016  . HLD (hyperlipidemia)   . Hypertension   . Hypothyroidism   . 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 Thrombocytopenia ([place]) 10/04/2016    Past Surgical History:  Procedure Laterality Date  . ELECTROMAGNETIC NAVIGATION BROCHOSCOPY N/A 12/07/2017   Procedure: ELECTROMAGNETIC NAVIGATION BRONCHOSCOPY;  Surgeon: Flora Lipps, MD;  Location: [facility] ORS;  Service: Cardiopulmonary;  Laterality: N/A;  . ESOPHAGOGASTRODUODENOSCOPY (EGD) WITH PROPOFOL N/A 07/08/2016   Procedure: ESOPHAGOGASTRODUODENOSCOPY (EGD) WITH PROPOFOL;  Surgeon: Manya Silvas, MD;  Location: [facility] ENDOSCOPY;  Service: Endoscopy;  Laterality: N/A;  . KNEE SURGERY Right    repair of acl tear  . PERIPHERAL VASCULAR CATHETERIZATION N/A 07/10/2016   Procedure: Lower Extremity Angiography;  Surgeon: Katha Cabal, MD;  Location: [place] CV LAB;  Service: Cardiovascular;  Laterality: N/A;  .  PERIPHERAL VASCULAR CATHETERIZATION N/A 07/10/2016   Procedure: Abdominal Aortogram w/Lower Extremity;  Surgeon: Katha Cabal, MD;  Location: [place] CV LAB;  Service: Cardiovascular;  Laterality: N/A;  . PERIPHERAL VASCULAR CATHETERIZATION  07/10/2016   Procedure: Lower Extremity Intervention;  Surgeon: Katha Cabal, MD;  Location: [place] CV LAB;  Service: Cardiovascular;;    Family History  Problem Relation Age of Onset  . Diabetes Mother   . Hypertension Mother   . Diabetes Maternal Grandfather   . Hypertension Maternal Grandfather   . Breast cancer Neg Hx     Social History:  reports that she quit smoking about 4 years ago. Her smoking use included cigarettes. She has a 58.75 pack-year smoking history. She has never used smokeless tobacco. She reports that she does not drink alcohol and does not use drugs.  The patient denies any exposure to radiation or toxins.  The patient lives in [place].  He/she is accompanied by her daughter.  Allergies:  Allergies  Allergen Reactions  . Ciprofloxacin Swelling    Facial swelling following single oral dose.   . Codeine Anaphylaxis  . Nsaids Other (See Comments)    PT HAS HEPC    Medications Prior to Admission  Medication Sig Dispense Refill  . allopurinol (ZYLOPRIM) 100 MG tablet TAKE 1 TABLET BY MOUTH EVERY DAY (Patient taking differently: Take 100 mg by mouth daily. ) 30 tablet 5  . clopidogrel (PLAVIX) 75 MG tablet Take 1 tablet (75 mg total) by mouth daily. 30 tablet 5  . cyanocobalamin 500 MCG tablet Take 500 mcg by mouth daily.    [name] Kitchen entecavir (BARACLUDE) 0.5 MG tablet Take by mouth.    . folic acid (FOLVITE) 1 MG tablet TAKE 1 TABLET (1 MG TOTAL) BY MOUTH DAILY. 90 tablet 1  . hydrochlorothiazide (HYDRODIURIL) 25 MG tablet Take 25 mg by mouth daily.    [name] Kitchen levothyroxine (SYNTHROID, LEVOTHROID) 75 MCG tablet Take 75 mcg by mouth daily before breakfast.     . lisinopril (PRINIVIL,ZESTRIL) 20 MG tablet Take 20 mg by mouth daily.     [name] Kitchen omeprazole (PRILOSEC) 20 MG capsule Take 1 capsule (20 mg total) by mouth daily. 60 capsule 0  . potassium chloride (KLOR-CON) 20 MEQ packet Take 20 mEq by mouth daily.    . pravastatin (PRAVACHOL) 40 MG tablet Take 40 mg by mouth every evening.    . predniSONE (DELTASONE) 20 MG tablet TAKE 4  TABLETS BY MOUTH ONCE DAILY WITH BREAKFAST (Patient taking differently: Take 80 mg by mouth daily with breakfast. ) 40 tablet 0  . Tiotropium Bromide Monohydrate (SPIRIVA RESPIMAT) 2.5 MCG/ACT AERS Inhale 2 puffs into the lungs daily for 1 day. 1 each 0  . Tiotropium Bromide-Olodaterol (STIOLTO RESPIMAT) 2.5-2.5 MCG/ACT AERS Inhale 2 puffs into the lungs daily.    [name] Kitchen albuterol (PROVENTIL HFA;VENTOLIN HFA) 108 (90 Base) MCG/ACT inhaler Inhale 2 puffs into the lungs every 6 (six) hours as needed for wheezing or shortness of breath.       Review of Systems  Constitutional: Positive for chills, fever and malaise/fatigue. Negative for diaphoresis and weight loss.  HENT: Negative.  Negative for congestion, ear pain, nosebleeds, sinus pain and sore throat.   Eyes: Negative.  Negative for blurred vision, double vision, photophobia and pain.  Respiratory: Negative.  Negative for cough, hemoptysis, sputum production, shortness of breath and wheezing.   Cardiovascular: Negative.  Negative for chest pain, palpitations, orthopnea and leg swelling.  Gastrointestinal: Negative.  Negative for abdominal  pain, blood in stool, constipation, diarrhea, heartburn, nausea and vomiting.  Genitourinary: Negative.  Negative for dysuria, flank pain, frequency, hematuria and urgency.  Musculoskeletal: Negative for back pain, joint pain and neck pain.       Achiness in knees distally.  Skin: Negative.  Negative for itching and rash.  Neurological: Negative for dizziness, tremors, sensory change, speech change and headaches.       Tingling/numbness in legs distally.  Endo/Heme/Allergies: Does not bruise/bleed easily.  Psychiatric/Behavioral: Negative for depression and memory loss. The patient is not nervous/anxious and does not have insomnia.   All other systems reviewed and are negative.    Vitals: Blood pressure 120/70, pulse 78, temperature 98.3 F (36.8 C), resp. rate 17, height 5\' 3"  (1.6 m), weight 168 lb (76.2  kg), SpO2 98 %.   Physical Exam Nursing note reviewed.  Constitutional:      Comments: Fatigued appearing woman lying comfortably in the ER in no acute distress.  HENT:     Head: Normocephalic and atraumatic.     Comments: Long gray hair.    Mouth/Throat:     Mouth: Mucous membranes are moist.     Pharynx: No oropharyngeal exudate or posterior oropharyngeal erythema.  Eyes:     General: No scleral icterus.    Extraocular Movements: Extraocular movements intact.     Conjunctiva/sclera: Conjunctivae normal.     Pupils: Pupils are equal, round, and reactive to light.  Cardiovascular:     Rate and Rhythm: Normal rate and regular rhythm.     Pulses: Normal pulses.     Heart sounds: Normal heart sounds.  Pulmonary:     Effort: Pulmonary effort is normal. No respiratory distress.     Breath sounds: Normal breath sounds. No wheezing, rhonchi or rales.  Abdominal:     General: Bowel sounds are normal. There is no distension.     Palpations: Abdomen is soft. There is no mass.     Tenderness: There is no abdominal tenderness. There is no right CVA tenderness, left CVA tenderness, guarding or rebound.  Musculoskeletal:        General: No swelling or tenderness.     Cervical back: Normal range of motion and neck supple. No tenderness.  Lymphadenopathy:     Head:     Right side of head: No preauricular, posterior auricular or occipital adenopathy.     Left side of head: No preauricular, posterior auricular or occipital adenopathy.     Cervical: No cervical adenopathy.     Upper Body:     Right upper body: No supraclavicular or axillary adenopathy.     Left upper body: No supraclavicular or axillary adenopathy.     Lower Body: No right inguinal adenopathy. No left inguinal adenopathy.  Skin:    Capillary Refill: Capillary refill takes less than 2 seconds.     Coloration: Skin is not jaundiced or pale.     Findings: No bruising, erythema or rash.  Neurological:     Mental Status: She is  alert and oriented to person, place, and time. Mental status is at baseline.     Cranial Nerves: No cranial nerve deficit.     Sensory: No sensory deficit.     Motor: No weakness.     Comments: Lower extremity strength and sensation intact.  Psychiatric:        Mood and Affect: Mood normal.        Behavior: Behavior normal.        Thought Content:  Thought content normal.        Judgment: Judgment normal.     Results for orders placed or performed during the hospital encounter of 07/16/20 (from the past 48 hour(s))  CBC with Differential     Status: Abnormal   Collection Time: 07/16/20 12:13 PM  Result Value Ref Range   WBC 5.8 4.0 - 10.5 K/uL   RBC 3.60 (L) 3.87 - 5.11 MIL/uL   Hemoglobin 11.6 (L) 12.0 - 15.0 g/dL   HCT 36.4 36 - 46 %   MCV 101.1 (H) 80.0 - 100.0 fL   MCH 32.2 26.0 - 34.0 pg   MCHC 31.9 30.0 - 36.0 g/dL   RDW 13.9 11.5 - 15.5 %   Platelets 95 (L) 150 - 400 K/uL    Comment: Immature Platelet Fraction may be clinically indicated, consider ordering this additional test [id number]    nRBC 0.0 0.0 - 0.2 %   Neutrophils Relative % 84 %   Neutro Abs 4.9 1.7 - 7.7 K/uL   Lymphocytes Relative 7 %   Lymphs Abs 0.4 (L) 0.7 - 4.0 K/uL   Monocytes Relative 7 %   Monocytes Absolute 0.4 0.1 - 1.0 K/uL   Eosinophils Relative 1 %   Eosinophils Absolute 0.0 0.0 - 0.5 K/uL   Basophils Relative 0 %   Basophils Absolute 0.0 0.0 - 0.1 K/uL   Immature Granulocytes 1 %   Abs Immature Granulocytes 0.04 0.00 - 0.07 K/uL    Comment: Performed at [facility], [address] [place], [place] [address]  Comprehensive metabolic panel     Status: Abnormal   Collection Time: 07/16/20 12:13 PM  Result Value Ref Range   Sodium 136 135 - 145 mmol/L   Potassium 4.0 3.5 - 5.1 mmol/L   Chloride 99 98 - 111 mmol/L   CO2 29 22 - 32 mmol/L   Glucose, Bld 128 (H) 70 - 99 mg/dL    Comment: Glucose reference range applies only to samples taken after fasting for at least 8 hours.    BUN 38 (H) 8 - 23 mg/dL   Creatinine, Ser 1.48 (H) 0.44 - 1.00 mg/dL   Calcium 8.8 (L) 8.9 - 10.3 mg/dL   Total Protein 6.5 6.5 - 8.1 g/dL   Albumin 3.6 3.5 - 5.0 g/dL   AST 20 15 - 41 U/L   ALT 25 0 - 44 U/L   Alkaline Phosphatase 50 38 - 126 U/L   Total Bilirubin 1.5 (H) 0.3 - 1.2 mg/dL   GFR, Estimated 40 (L) >60 mL/min    Comment: (NOTE) Calculated using the CKD-EPI Creatinine Equation (2021)    Anion gap 8 5 - 15    Comment: Performed at [facility], [address] [place], [place] [address]  Urinalysis, Complete w Microscopic     Status: Abnormal   Collection Time: 07/16/20 12:13 PM  Result Value Ref Range   Color, Urine YELLOW (A) YELLOW   APPearance TURBID (A) CLEAR   Specific Gravity, Urine 1.014 1.005 - 1.030   pH 6.0 5.0 - 8.0   Glucose, UA NEGATIVE NEGATIVE mg/dL   Hgb urine dipstick MODERATE (A) NEGATIVE   Bilirubin Urine NEGATIVE NEGATIVE   Ketones, ur NEGATIVE NEGATIVE mg/dL   Protein, ur 100 (A) NEGATIVE mg/dL   Nitrite POSITIVE (A) NEGATIVE   Leukocytes,Ua LARGE (A) NEGATIVE   RBC / HPF 21-50 0 - 5 RBC/hpf   WBC, UA >50 (H) 0 - 5 WBC/hpf   Bacteria, UA  FEW (A) NONE SEEN   Squamous Epithelial / LPF NONE SEEN 0 - 5   WBC Clumps PRESENT     Comment: Performed at [facility], [address] [place] [address], [place] [address]  Respiratory Panel by RT PCR (Flu A&B, Covid) - Nasopharyngeal Swab     Status: None   Collection Time: 07/16/20 12:13 PM   Specimen: Nasopharyngeal Swab  Result Value Ref Range   SARS Coronavirus 2 by RT PCR NEGATIVE NEGATIVE    Comment: (NOTE) SARS-CoV-2 target nucleic acids are NOT DETECTED.  The SARS-CoV-2 RNA is generally detectable in upper respiratoy specimens during the acute phase of infection. The lowest concentration of SARS-CoV-2 viral copies this assay can detect is 131 copies/mL. A negative result does not preclude SARS-Cov-2 infection and should not be used as the sole basis for treatment or other patient  management decisions. A negative result may occur with  improper specimen collection/handling, submission of specimen other than nasopharyngeal swab, presence of viral mutation(s) within the areas targeted by this assay, and inadequate number of viral copies (<131 copies/mL). A negative result must be combined with clinical observations, patient history, and epidemiological information. The expected result is Negative.  Fact Sheet for Patients:  PinkCheek.be  Fact Sheet for Healthcare Providers:  GravelBags.it  This test is no t yet approved or cleared by the [place] FDA and  has been authorized for detection and/or diagnosis of SARS-CoV-2 by FDA under an Emergency Use Authorization (EUA). This EUA will remain  in effect (meaning this test can be used) for the duration of the COVID-19 declaration under Section 564(b)(1) of the Act, 21 U.S.C. section 360bbb-3(b)(1), unless the authorization is terminated or revoked sooner.     Influenza A by PCR NEGATIVE NEGATIVE   Influenza B by PCR NEGATIVE NEGATIVE    Comment: (NOTE) The Xpert Xpress SARS-CoV-2/FLU/RSV assay is intended as an aid in  the diagnosis of influenza from Nasopharyngeal swab specimens and  should not be used as a sole basis for treatment. Nasal washings and  aspirates are unacceptable for Xpert Xpress SARS-CoV-2/FLU/RSV  testing.  Fact Sheet for Patients: PinkCheek.be  Fact Sheet for Healthcare Providers: GravelBags.it  This test is not yet approved or cleared by the [place] FDA and  has been authorized for detection and/or diagnosis of SARS-CoV-2 by  FDA under an Emergency Use Authorization (EUA). This EUA will remain  in effect (meaning this test can be used) for the duration of the  Covid-19 declaration under Section 564(b)(1) of the Act, 21  U.S.C. section 360bbb-3(b)(1), unless the  authorization is  terminated or revoked. Performed at [facility], [address] [place], [place] [address]   Magnesium     Status: None   Collection Time: 07/16/20 12:13 PM  Result Value Ref Range   Magnesium 1.7 1.7 - 2.4 mg/dL    Comment: Performed at [facility], [address] [place], [place] [address]  CK     Status: Abnormal   Collection Time: 07/16/20 12:13 PM  Result Value Ref Range   Total CK 18 (L) 38.0 - 234.0 U/L    Comment: Performed at [facility], [address] [place], [place] [address]  Lactate dehydrogenase     Status: Abnormal   Collection Time: 07/16/20 12:13 PM  Result Value Ref Range   LDH 267 (H) 98 - 192 U/L    Comment: Performed at [facility], [address], [place], [place] [address]  Pathologist smear  review     Status: None   Collection Time: 07/16/20 12:13 PM  Result Value Ref Range   Path Review Blood smear is reviewed.     Comment: Patient with reported history of autoimmune hemolytic anemia presents with fever and numbness. Macrocytic anemia and thrombocytopenia. Morphology of RBCs, WBCs, and platelets within normal limits. No evidence of circulating blasts or schistocytes. Reviewed by Elmon Kirschner, M.D. Performed at [facility], [address] [place], [place] [address]    DG Chest 2 View  Result Date: 07/16/2020 CLINICAL DATA:  Fever. EXAM: CHEST - 2 VIEW COMPARISON:  05/27/2020 FINDINGS: The heart size and mediastinal contours are within normal limits. Both lungs are clear. The visualized skeletal structures are unremarkable. IMPRESSION: No active cardiopulmonary disease. Electronically Signed   By: Franchot Gallo M.D.   On: 07/16/2020 12:55    Assessment:  The patient is a 64 y.o. woman with a warm autoimmune hemolytic anemia admitted with fever and chills s/p Rituxan infusion on 111/15/2021.  She is on a prednisone taper prednisone.  She on on entecavir to prevent  reactivation of hepatitis B.  Urinalysis suggests a UTI.  She has no CVA tenderness.  She is on ceftriaxone.  Symptomatically, she feels fatigued.  She has achines in her knees to her toes.  She has PVD and is s/p bilateral stents.  Neurologic exam is normal.  Feet are warm with lower extremity pulses and capillary refill are normal.   Plan:   1.   Warm autoimmune hemolytic anemia  She is s/p Rituxan on 07/15/2020.  She is on prednisone 40 mg a day.  Hemoglobin is 11.6 (previously 11.9 on 07/04/2020 and 13.1 on 07/15/2020).  LDH is 267.  Peripheral smear reveals no schistocytes. 2.   Thrombocytopenia  Platelet count is 95,000 (previously 100,000 on 07/15/2020 and 131,000 on 07/15/2020).  Patient has a history of periodic mild thrombocytopenia.  She denies any bleeding.  Suspect some component of consumption with fever. 3.   Fever and chills  Etiology felt secondary to possible Rituxan.  Fever work revealed a UTI (blood and urine cultures pending).  No CVA tenderness.  Patient on ceftriaxone. 4.   History of hepatitis B  Patient on entecavir prophylaxis to prevent hepatitis B reactivation.  Dose adjusted based on renal function.                   CrCl 30-49 ml/min   50% dosing (0.5 mg QOD).                    CrCl >= 50 ml/min 100% dosing (0.5 mg a day). 5.   Lower extremity symptoms  Etiology unclear.  Neurologic exam is normal.  Continue to monitor.  Thank you for allowing me to participate in TANYIA GRABBE 's care.  I will follow her closely with you while hospitalized and after discharge in the outpatient department.   Lequita Asal, MD  07/16/2020, 9:34 PM

## 2020-07-16 NOTE — Telephone Encounter (Signed)
Patient advised to go to ER per Orbie Pyo, CMA

## 2020-07-17 ENCOUNTER — Inpatient Hospital Stay: Payer: BC Managed Care – PPO

## 2020-07-17 DIAGNOSIS — E039 Hypothyroidism, unspecified: Secondary | ICD-10-CM | POA: Diagnosis present

## 2020-07-17 DIAGNOSIS — Z87891 Personal history of nicotine dependence: Secondary | ICD-10-CM | POA: Diagnosis not present

## 2020-07-17 DIAGNOSIS — Z7902 Long term (current) use of antithrombotics/antiplatelets: Secondary | ICD-10-CM | POA: Diagnosis not present

## 2020-07-17 DIAGNOSIS — N179 Acute kidney failure, unspecified: Secondary | ICD-10-CM | POA: Diagnosis present

## 2020-07-17 DIAGNOSIS — I119 Hypertensive heart disease without heart failure: Secondary | ICD-10-CM | POA: Diagnosis present

## 2020-07-17 DIAGNOSIS — B1920 Unspecified viral hepatitis C without hepatic coma: Secondary | ICD-10-CM | POA: Diagnosis present

## 2020-07-17 DIAGNOSIS — B9620 Unspecified Escherichia coli [E. coli] as the cause of diseases classified elsewhere: Secondary | ICD-10-CM | POA: Diagnosis present

## 2020-07-17 DIAGNOSIS — M25561 Pain in right knee: Secondary | ICD-10-CM | POA: Diagnosis present

## 2020-07-17 DIAGNOSIS — I739 Peripheral vascular disease, unspecified: Secondary | ICD-10-CM | POA: Diagnosis present

## 2020-07-17 DIAGNOSIS — L930 Discoid lupus erythematosus: Secondary | ICD-10-CM | POA: Diagnosis present

## 2020-07-17 DIAGNOSIS — M25562 Pain in left knee: Secondary | ICD-10-CM | POA: Diagnosis present

## 2020-07-17 DIAGNOSIS — E860 Dehydration: Secondary | ICD-10-CM | POA: Diagnosis present

## 2020-07-17 DIAGNOSIS — N3000 Acute cystitis without hematuria: Secondary | ICD-10-CM | POA: Diagnosis not present

## 2020-07-17 DIAGNOSIS — R918 Other nonspecific abnormal finding of lung field: Secondary | ICD-10-CM | POA: Diagnosis present

## 2020-07-17 DIAGNOSIS — R7881 Bacteremia: Principal | ICD-10-CM

## 2020-07-17 DIAGNOSIS — Z7952 Long term (current) use of systemic steroids: Secondary | ICD-10-CM | POA: Diagnosis not present

## 2020-07-17 DIAGNOSIS — E785 Hyperlipidemia, unspecified: Secondary | ICD-10-CM | POA: Diagnosis present

## 2020-07-17 DIAGNOSIS — Z79890 Hormone replacement therapy (postmenopausal): Secondary | ICD-10-CM | POA: Diagnosis not present

## 2020-07-17 DIAGNOSIS — J449 Chronic obstructive pulmonary disease, unspecified: Secondary | ICD-10-CM | POA: Diagnosis present

## 2020-07-17 DIAGNOSIS — K219 Gastro-esophageal reflux disease without esophagitis: Secondary | ICD-10-CM | POA: Diagnosis present

## 2020-07-17 DIAGNOSIS — Z20822 Contact with and (suspected) exposure to covid-19: Secondary | ICD-10-CM | POA: Diagnosis present

## 2020-07-17 DIAGNOSIS — D5911 Warm autoimmune hemolytic anemia: Secondary | ICD-10-CM | POA: Diagnosis present

## 2020-07-17 DIAGNOSIS — D591 Autoimmune hemolytic anemia, unspecified: Secondary | ICD-10-CM | POA: Diagnosis not present

## 2020-07-17 DIAGNOSIS — D696 Thrombocytopenia, unspecified: Secondary | ICD-10-CM | POA: Diagnosis present

## 2020-07-17 DIAGNOSIS — I2510 Atherosclerotic heart disease of native coronary artery without angina pectoris: Secondary | ICD-10-CM | POA: Diagnosis present

## 2020-07-17 LAB — BLOOD CULTURE ID PANEL (REFLEXED) - BCID2

## 2020-07-17 LAB — BASIC METABOLIC PANEL
Anion gap: 9 (ref 5–15)
BUN: 32 mg/dL — ABNORMAL HIGH (ref 8–23)
CO2: 27 mmol/L (ref 22–32)
Calcium: 8.8 mg/dL — ABNORMAL LOW (ref 8.9–10.3)
Chloride: 102 mmol/L (ref 98–111)
Creatinine, Ser: 1.03 mg/dL — ABNORMAL HIGH (ref 0.44–1.00)
GFR, Estimated: >60 mL/min (ref >60)
Glucose, Bld: 98 mg/dL (ref 70–99)
Potassium: 4 mmol/L (ref 3.5–5.1)
Sodium: 138 mmol/L (ref 135–145)

## 2020-07-17 LAB — CBC WITH DIFFERENTIAL/PLATELET
Abs Immature Granulocytes: 0.03 10*3/uL (ref 0.00–0.07)
Basophils Absolute: 0 10*3/uL (ref 0.0–0.1)
Basophils Relative: 0 %
Eosinophils Absolute: 0 10*3/uL (ref 0.0–0.5)
Eosinophils Relative: 1 %
HCT: 33.2 % — ABNORMAL LOW (ref 36.0–46.0)
Hemoglobin: 10.9 g/dL — ABNORMAL LOW (ref 12.0–15.0)
Immature Granulocytes: 1 %
Lymphocytes Relative: 7 %
Lymphs Abs: 0.4 10*3/uL — ABNORMAL LOW (ref 0.7–4.0)
MCH: 32.5 pg (ref 26.0–34.0)
MCHC: 32.8 g/dL (ref 30.0–36.0)
MCV: 99.1 fL (ref 80.0–100.0)
Monocytes Absolute: 0.3 10*3/uL (ref 0.1–1.0)
Monocytes Relative: 6 %
Neutro Abs: 4.8 10*3/uL (ref 1.7–7.7)
Neutrophils Relative %: 85 %
Platelets: 85 10*3/uL — ABNORMAL LOW (ref 150–400)
RBC: 3.35 MIL/uL — ABNORMAL LOW (ref 3.87–5.11)
RDW: 13.5 % (ref 11.5–15.5)
WBC: 5.5 10*3/uL (ref 4.0–10.5)
nRBC: 0 % (ref 0.0–0.2)

## 2020-07-17 LAB — HIV ANTIBODY (ROUTINE TESTING W REFLEX): HIV Screen 4th Generation wRfx: NONREACTIVE

## 2020-07-17 LAB — HAPTOGLOBIN: Haptoglobin: <10 mg/dL — ABNORMAL LOW (ref 37–355)

## 2020-07-17 MED ORDER — SODIUM CHLORIDE 0.9 % IV SOLN
2.0000 g | INTRAVENOUS | Status: DC
  Administered 2020-07-17 – 2020-07-18 (×2): 2 g via INTRAVENOUS
  Filled 2020-07-17 (×2): qty 2

## 2020-07-17 MED ORDER — DICLOFENAC SODIUM 1 % EX GEL
2.0000 g | Freq: Four times a day (QID) | CUTANEOUS | Status: DC
  Administered 2020-07-17 – 2020-07-18 (×4): 2 g via TOPICAL
  Filled 2020-07-17: qty 100

## 2020-07-17 MED ORDER — ENTECAVIR 0.5 MG PO TABS
0.5000 mg | ORAL_TABLET | Freq: Every day | ORAL | Status: DC
  Administered 2020-07-17 – 2020-07-18 (×2): 0.5 mg via ORAL
  Filled 2020-07-17 (×3): qty 1

## 2020-07-17 NOTE — Consult Note (Signed)
PHARMACY NOTE: RENAL DOSAGE ADJUSTMENT  Current regimen includes a mismatch between antimicrobial dosage and estimated renal function.  Pharmacy has been consulted to review and adjust dosing.  Current  dosage: Entecavir 0.5mg  qod  Renal Function:  Estimated Creatinine Clearance: 55.3 mL/min (A) (by C-G formula based on SCr of 1.03 mg/dL (H)). []      On intermittent HD, scheduled: []      On CRRT    Dosage has been changed to:  Entecavir 0.5mg  qd  Additional comments:   Thank you for allowing pharmacy to be a part of this patient's care.  Lu Duffel, PharmD, BCPS Clinical Pharmacist 07/17/2020 1:18 PM

## 2020-07-17 NOTE — Progress Notes (Signed)
PHARMACY - PHYSICIAN COMMUNICATION CRITICAL VALUE ALERT - BLOOD CULTURE IDENTIFICATION (BCID)  Tina Page is an 64 y.o. female who presented to [facility] on 07/16/2020 with a chief complaint of UTI   Assessment:  EColi in 2 of 4 bottles , no resistance  (include suspected source if known)  Name of physician (or Provider) Contacted: Jonny Ruiz, NP   Current antibiotics: Ceftriaxone 1 gm IV Q24H   Changes to prescribed antibiotics recommended:  Will increase dose to ceftriaxone 2 gm IV Q24H.   Results for orders placed or performed during the hospital encounter of 07/16/20  Blood Culture ID Panel (Reflexed) (Collected: 07/16/2020  2:54 PM)  Result Value Ref Range   Enterococcus faecalis NOT DETECTED NOT DETECTED   Enterococcus Faecium NOT DETECTED NOT DETECTED   Listeria monocytogenes NOT DETECTED NOT DETECTED   Staphylococcus species NOT DETECTED NOT DETECTED   Staphylococcus aureus (BCID) NOT DETECTED NOT DETECTED   Staphylococcus epidermidis NOT DETECTED NOT DETECTED   Staphylococcus lugdunensis NOT DETECTED NOT DETECTED   Streptococcus species NOT DETECTED NOT DETECTED   Streptococcus agalactiae NOT DETECTED NOT DETECTED   Streptococcus pneumoniae NOT DETECTED NOT DETECTED   Streptococcus pyogenes NOT DETECTED NOT DETECTED   A.calcoaceticus-baumannii NOT DETECTED NOT DETECTED   Bacteroides fragilis NOT DETECTED NOT DETECTED   Enterobacterales DETECTED (A) NOT DETECTED   Enterobacter cloacae complex NOT DETECTED NOT DETECTED   Escherichia coli DETECTED (A) NOT DETECTED   Klebsiella aerogenes NOT DETECTED NOT DETECTED   Klebsiella oxytoca NOT DETECTED NOT DETECTED   Klebsiella pneumoniae NOT DETECTED NOT DETECTED   Proteus species NOT DETECTED NOT DETECTED   Salmonella species NOT DETECTED NOT DETECTED   Serratia marcescens NOT DETECTED NOT DETECTED   Haemophilus influenzae NOT DETECTED NOT DETECTED   Neisseria meningitidis NOT DETECTED NOT DETECTED   Pseudomonas  aeruginosa NOT DETECTED NOT DETECTED   Stenotrophomonas maltophilia NOT DETECTED NOT DETECTED   Candida albicans NOT DETECTED NOT DETECTED   Candida auris NOT DETECTED NOT DETECTED   Candida glabrata NOT DETECTED NOT DETECTED   Candida krusei NOT DETECTED NOT DETECTED   Candida parapsilosis NOT DETECTED NOT DETECTED   Candida tropicalis NOT DETECTED NOT DETECTED   Cryptococcus neoformans/gattii NOT DETECTED NOT DETECTED   CTX-M ESBL NOT DETECTED NOT DETECTED   Carbapenem resistance IMP NOT DETECTED NOT DETECTED   Carbapenem resistance KPC NOT DETECTED NOT DETECTED   Carbapenem resistance NDM NOT DETECTED NOT DETECTED   Carbapenem resist OXA 48 LIKE NOT DETECTED NOT DETECTED   Carbapenem resistance VIM NOT DETECTED NOT DETECTED    Quaid Yeakle D 07/17/2020  4:14 AM

## 2020-07-17 NOTE — Evaluation (Signed)
Physical Therapy Evaluation Patient Details Name: TYECHIA ALLMENDINGER MRN: [id number] DOB: [birth year] Today's Date: 07/17/2020   History of Present Illness  presented to ER secondary to fever, weakness and paresthesia throughout bilat LEs after rituxan infusion; admitted for management of UTI  Clinical Impression  Upon evaluation, patient alert and oriented; follows commands and agreeable to participation with session.  Endorses mild "ache" in bilat LEs (R > L); noted improvement in paresthesia reported.  Able to complete sit/stand, basic transfers and gait (200') without assist device, sup/mod indep.  Demonstrates reciprocal stepping pattern with fair/good step height/length; mild decrease in stance time/loading to R LE, but no buckling, LOB or safety concern.  Decreased cadence/gait speed; patient feels this will continue to improve as OOB mobility returns to baseline activity level. Would benefit from skilled PT to address above deficits in high-level balance and functional activity tolerance and promote optimal return to PLOF; will maintain on caseload throughout remaining stay to ensure progressive mobility.  Anticipate no formal PT needs upon discharge.    Follow Up Recommendations No PT follow up (will maintain on caseload throughout remaining stay; anticipate no formal PT needs upon discharge)    Equipment Recommendations       Recommendations for Other Services       Precautions / Restrictions Precautions Precautions: None Restrictions Weight Bearing Restrictions: No      Mobility  Bed Mobility               General bed mobility comments: seated in chair beginning/end of treatment session    Transfers Overall transfer level: Independent Equipment used: None             General transfer comment: fair/good LE strength/control with functional activities  Ambulation/Gait Ambulation/Gait assistance: Supervision Gait Distance (Feet): 200 Feet Assistive device:  None   Gait velocity: 10' walk time, 9-10 seconds   General Gait Details: reciprocal stepping pattern with fair/good step height/length; mild decrease in stance time/loading to R LE, but no buckling, LOB or safety concern.  Decreased cadence/gait speed; patient feels this will continue to improve as OOB mobility returns to baseline activity level.  Stairs            Wheelchair Mobility    Modified Rankin (Stroke Patients Only)       Balance Overall balance assessment: Modified Independent                                           Pertinent Vitals/Pain Pain Assessment: No/denies pain    Home Living Family/patient expects to be discharged to:: Private residence Living Arrangements: Alone Available Help at Discharge: Family;Available PRN/intermittently Type of Home: House Home Access: Stairs to enter Entrance Stairs-Rails: Right Entrance Stairs-Number of Steps: 2 Home Layout: Two level;Able to live on main level with bedroom/bathroom Home Equipment: None      Prior Function Level of Independence: Independent         Comments: Indep with ADLs, household and community mobilization without assist device; + driving and community errands; denies fall history     Hand Dominance        Extremity/Trunk Assessment   Upper Extremity Assessment Upper Extremity Assessment: Overall WFL for tasks assessed    Lower Extremity Assessment Lower Extremity Assessment: Overall WFL for tasks assessed       Communication   Communication: No difficulties  Cognition Arousal/Alertness: Awake/alert Behavior During  Therapy: WFL for tasks assessed/performed Overall Cognitive Status: Within Functional Limits for tasks assessed                                        General Comments      Exercises     Assessment/Plan    PT Assessment Patient needs continued PT services  PT Problem List Decreased strength;Decreased activity  tolerance;Decreased balance;Decreased mobility;Decreased knowledge of use of DME;Decreased safety awareness;Decreased knowledge of precautions       PT Treatment Interventions DME instruction;Gait training;Stair training;Functional mobility training;Therapeutic activities;Therapeutic exercise;Balance training;Patient/family education    PT Goals (Current goals can be found in the Care Plan section)  Acute Rehab PT Goals Patient Stated Goal: to go home PT Goal Formulation: With patient Time For Goal Achievement: 07/31/20 Potential to Achieve Goals: Good    Frequency Min 2X/week   Barriers to discharge        Co-evaluation               AM-PAC PT "6 Clicks" Mobility  Outcome Measure Help needed turning from your back to your side while in a flat bed without using bedrails?: None Help needed moving from lying on your back to sitting on the side of a flat bed without using bedrails?: None Help needed moving to and from a bed to a chair (including a wheelchair)?: None Help needed standing up from a chair using your arms (e.g., wheelchair or bedside chair)?: None Help needed to walk in hospital room?: None Help needed climbing 3-5 steps with a railing? : A Little 6 Click Score: 23    End of Session Equipment Utilized During Treatment: Gait belt Activity Tolerance: Patient tolerated treatment well Patient left: in chair;with call bell/phone within reach;with family/visitor present Nurse Communication: Mobility status PT Visit Diagnosis: Muscle weakness (generalized) (M62.81);Difficulty in walking, not elsewhere classified (R26.2)    Time: 0071-2197 PT Time Calculation (min) (ACUTE ONLY): 10 min   Charges:   PT Evaluation $PT Eval Low Complexity: 1 Low         Marites Nath H. Owens Shark, PT, DPT, NCS 07/17/20, 4:43 PM [phone]

## 2020-07-17 NOTE — Hospital Course (Addendum)
Tina Page is a 64 y.o. female with medical history significant for coronary artery disease, COPD, HTN, hypothyroidism, hepatitis C, PVD status post lower extremity stents and hemolytic anemia currently undergoing rituximab infusions who presented to the ED of 07/16/20 for evaluation of fever after receiving her Rituxan infusion on 11/15.  Tmax 101 F at home.  Evaluation revealed a UTI with concurrent bacteremia.

## 2020-07-17 NOTE — Progress Notes (Signed)
PROGRESS NOTE    Tina Page   WLN:[id number]  DOB: June 20, 1956  PCP: Frazier Richards, MD    DOA: 07/16/2020 LOS: 0   Brief Narrative   Tina Page is a 64 y.o. female with medical history significant for coronary artery disease, COPD, HTN, hypothyroidism, hepatitis C, PVD status post lower extremity stents and hemolytic anemia currently undergoing rituximab infusions who presented to the ED of 07/16/20 for evaluation of fever after receiving her Rituxan infusion on 11/15.  Tmax 101 F at home.  Evaluation revealed a UTI with concurrent bacteremia.  Patient is on IV antibiotics pending culture sensitivities.     Assessment & Plan   Principal Problem:   UTI (urinary tract infection) Active Problems:   HTN (hypertension)   PAD (peripheral artery disease) (HCC)   Thrombocytopenia (HCC)   Autoimmune hemolytic anemia (HCC)   Current chronic use of systemic steroids   Hypothyroidism   Fever   Bacteremia due to Escherichia coli   Acute UTI with associated bacteremia - POA.  Urine and blood cultures growing E coli.  On Rocephin 2 g IV daily - continue.   Follow up on culture sensitivities.  Will treat with IV antibiotics for at least 48-72 hours prior to transition to oral.  AKI - present on admission, Cr 1.48.  Resolved with IV fluids.  Cr today is 1.03.    Stop fluids.   Bilateral knee pain, R>L - chronic.  Right knee xray today given no imaging recently.  Voltaren gel.  PT eval.  Hx of Peripheral Artery Disease - continue Plavix, stain  Hypothyroidism - continue Synthroid  Warm autoimmune hemolytic anemia / thrombocytopenia - follows with Dr. Mike Gip (consulted).  On Rituxan for WAIHA, last infusion 07/15/20.  No schistocytes seen on PBS.  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   Continue prednisone 40 mg daily.  Avoid heparin products.  SCD's for VTE ppx.  COPD - not acutely exacerbated.  Continue Spiriva, PRN albuterol    Patient BMI:  Body mass index is 29.76 kg/m.   DVT prophylaxis: SCDs Start: 07/16/20 1354   Diet:  Diet Orders (From admission, onward)    Start     Ordered   07/16/20 1355  Diet 2 gram sodium Room service appropriate? Yes; Fluid consistency: Thin  Diet effective now       Question Answer Comment  Room service appropriate? Yes   Fluid consistency: Thin      07/16/20 1355            Code Status: Full Code    Subjective 07/17/20    Pt seen with daughter at bedside.  Reports feeling okay. Both knees hurt, right is worse.  Daughter states this was reason she came in.  No imaging done, so xray ordered.  Pt denies fever/chills, or other acute complaints.     Disposition Plan & Communication   Status is: Inpatient  Remains inpatient appropriate because:IV treatments appropriate due to intensity of illness or inability to take PO, on IV antibiotics as above   Dispo: The patient is from: Home              Anticipated d/c is to: Home              Anticipated d/c date is: 2 days              Patient currently is not medically stable to d/c.   Family Communication: daughter at bedside on  rounds today   Consults, Procedures, Significant Events   Consultants:   Oncology  Procedures:   None  Antimicrobials:  Anti-infectives (From admission, onward)   Start     Dose/Rate Route Frequency Ordered Stop   07/17/20 1500  entecavir (BARACLUDE) tablet 0.5 mg        0.5 mg Oral Daily 07/17/20 1316     07/17/20 1000  cefTRIAXone (ROCEPHIN) 1 g in sodium chloride 0.9 % 100 mL IVPB  Status:  Discontinued        1 g 200 mL/hr over 30 Minutes Intravenous Every 24 hours 07/16/20 1356 07/17/20 0416   07/17/20 0500  cefTRIAXone (ROCEPHIN) 2 g in sodium chloride 0.9 % 100 mL IVPB        2 g 200 mL/hr over 30 Minutes Intravenous Every 24 hours 07/17/20 0417     07/16/20 1400  entecavir (BARACLUDE) tablet 0.5 mg  Status:  Discontinued        0.5 mg Oral Every other day 07/16/20 1355 07/17/20 1316    07/16/20 1330  cefTRIAXone (ROCEPHIN) 1 g in sodium chloride 0.9 % 100 mL IVPB        1 g 200 mL/hr over 30 Minutes Intravenous  Once 07/16/20 1315 07/16/20 1639         Objective   Vitals:   07/17/20 0442 07/17/20 0700 07/17/20 1121 07/17/20 1523  BP: (!) 132/56 139/62 (!) 119/56 (!) 132/59  Pulse: 72 77 75 80  Resp: 18 18 17 18   Temp: 98.3 F (36.8 C) 98.8 F (37.1 C) 98.3 F (36.8 C) 98.4 F (36.9 C)  TempSrc: Oral Oral    SpO2: 99% 100% 99% 98%  Weight:      Height:        Intake/Output Summary (Last 24 hours) at 07/17/2020 1558 Last data filed at 07/17/2020 1500 Gross per 24 hour  Intake 1500 ml  Output --  Net 1500 ml   Filed Weights   07/16/20 1131  Weight: 76.2 kg    Physical Exam:  General exam: awake, alert, no acute distress HEENT: moist mucus membranes, hearing grossly normal  Respiratory system: CTAB, no wheezes, rales or rhonchi, normal respiratory effort. Cardiovascular system: normal S1/S2, RRR, no pedal edema.   Gastrointestinal system: soft, NT, ND, +bowel sounds. Central nervous system: A&O x3. no gross focal neurologic deficits, normal speech Extremities: moves all, no edema, normal tone Psychiatry: normal mood, congruent affect, judgement and insight appear normal  Labs   Data Reviewed: I have personally reviewed following labs and imaging studies  CBC: Recent Labs  Lab 07/15/20 0846 07/16/20 1213 07/17/20 0427  WBC 7.4 5.8 5.5  NEUTROABS 6.4 4.9 4.8  HGB 13.1 11.6* 10.9*  HCT 40.1 36.4 33.2*  MCV 100.0 101.1* 99.1  PLT 100* 95* 85*   Basic Metabolic Panel: Recent Labs  Lab 07/15/20 0846 07/16/20 1213 07/17/20 0427  NA 139 136 138  K 4.3 4.0 4.0  CL 101 99 102  CO2 29 29 27   GLUCOSE 123* 128* 98  BUN 38* 38* 32*  CREATININE 1.24* 1.48* 1.03*  CALCIUM 9.4 8.8* 8.8*  MG  --  1.7  --    GFR: Estimated Creatinine Clearance: 55.3 mL/min (A) (by C-G formula based on SCr of 1.03 mg/dL (H)). Liver Function Tests: Recent  Labs  Lab 07/15/20 0846 07/16/20 1213  AST 16 20  ALT 29 25  ALKPHOS 52 50  BILITOT 1.1 1.5*  PROT 7.1 6.5  ALBUMIN 4.2 3.6  No results for input(s): LIPASE, AMYLASE in the last 168 hours. No results for input(s): AMMONIA in the last 168 hours. Coagulation Profile: No results for input(s): INR, PROTIME in the last 168 hours. Cardiac Enzymes: Recent Labs  Lab 07/16/20 1213  CKTOTAL 18*   BNP (last 3 results) No results for input(s): PROBNP in the last 8760 hours. HbA1C: No results for input(s): HGBA1C in the last 72 hours. CBG: No results for input(s): GLUCAP in the last 168 hours. Lipid Profile: No results for input(s): CHOL, HDL, LDLCALC, TRIG, CHOLHDL, LDLDIRECT in the last 72 hours. Thyroid Function Tests: No results for input(s): TSH, T4TOTAL, FREET4, T3FREE, THYROIDAB in the last 72 hours. Anemia Panel: No results for input(s): VITAMINB12, FOLATE, FERRITIN, TIBC, IRON, RETICCTPCT in the last 72 hours. Sepsis Labs: No results for input(s): PROCALCITON, LATICACIDVEN in the last 168 hours.  Recent Results (from the past 240 hour(s))  Respiratory Panel by RT PCR (Flu A&B, Covid) - Nasopharyngeal Swab     Status: None   Collection Time: 07/16/20 12:13 PM   Specimen: Nasopharyngeal Swab  Result Value Ref Range Status   SARS Coronavirus 2 by RT PCR NEGATIVE NEGATIVE Final    Comment: (NOTE) SARS-CoV-2 target nucleic acids are NOT DETECTED.  The SARS-CoV-2 RNA is generally detectable in upper respiratoy specimens during the acute phase of infection. The lowest concentration of SARS-CoV-2 viral copies this assay can detect is 131 copies/mL. A negative result does not preclude SARS-Cov-2 infection and should not be used as the sole basis for treatment or other patient management decisions. A negative result may occur with  improper specimen collection/handling, submission of specimen other than nasopharyngeal swab, presence of viral mutation(s) within the areas  targeted by this assay, and inadequate number of viral copies (<131 copies/mL). A negative result must be combined with clinical observations, patient history, and epidemiological information. The expected result is Negative.  Fact Sheet for Patients:  PinkCheek.be  Fact Sheet for Healthcare Providers:  GravelBags.it  This test is no t yet approved or cleared by the [place] FDA and  has been authorized for detection and/or diagnosis of SARS-CoV-2 by FDA under an Emergency Use Authorization (EUA). This EUA will remain  in effect (meaning this test can be used) for the duration of the COVID-19 declaration under Section 564(b)(1) of the Act, 21 U.S.C. section 360bbb-3(b)(1), unless the authorization is terminated or revoked sooner.     Influenza A by PCR NEGATIVE NEGATIVE Final   Influenza B by PCR NEGATIVE NEGATIVE Final    Comment: (NOTE) The Xpert Xpress SARS-CoV-2/FLU/RSV assay is intended as an aid in  the diagnosis of influenza from Nasopharyngeal swab specimens and  should not be used as a sole basis for treatment. Nasal washings and  aspirates are unacceptable for Xpert Xpress SARS-CoV-2/FLU/RSV  testing.  Fact Sheet for Patients: PinkCheek.be  Fact Sheet for Healthcare Providers: GravelBags.it  This test is not yet approved or cleared by the [place] FDA and  has been authorized for detection and/or diagnosis of SARS-CoV-2 by  FDA under an Emergency Use Authorization (EUA). This EUA will remain  in effect (meaning this test can be used) for the duration of the  Covid-19 declaration under Section 564(b)(1) of the Act, 21  U.S.C. section 360bbb-3(b)(1), unless the authorization is  terminated or revoked. Performed at [facility], [address], [place], [place] [address]   Urine culture     Status: Abnormal (Preliminary result)    Collection Time: 07/16/20  1:16 PM   Specimen: Urine, Random  Result Value Ref Range Status   Specimen Description   Final    URINE, RANDOM Performed at [facility], [address], [place], [place] [address]    Special Requests   Final    NONE Performed at [facility], [address] [place], [place] [address]    Culture (A)  Final    >=100,000 COLONIES/mL ESCHERICHIA COLI SUSCEPTIBILITIES TO FOLLOW Performed at [facility], [place] [address], [place], [place] [address]    Report Status PENDING  Incomplete  Blood culture (routine x 2)     Status: None (Preliminary result)   Collection Time: 07/16/20  2:54 PM   Specimen: BLOOD  Result Value Ref Range Status   Specimen Description BLOOD LEFT ANTECUBITAL  Final   Special Requests   Final    BOTTLES DRAWN AEROBIC AND ANAEROBIC Blood Culture results may not be optimal due to an excessive volume of blood received in culture bottles   Culture  Setup Time   Final    GRAM NEGATIVE RODS AEROBIC BOTTLE ONLY Performed at [facility], [address], [place], [place] [address]    Culture GRAM NEGATIVE RODS  Final   Report Status PENDING  Incomplete  Blood culture (routine x 2)     Status: None (Preliminary result)   Collection Time: 07/16/20  2:54 PM   Specimen: BLOOD  Result Value Ref Range Status   Specimen Description   Final    BLOOD RIGHT ANTECUBITAL Performed at [facility], [address] [place], [place] [address]    Special Requests   Final    BOTTLES DRAWN AEROBIC AND ANAEROBIC Blood Culture results may not be optimal due to an excessive volume of blood received in culture bottles Performed at [facility], [address] [place], [place] [address]    Culture  Setup Time   Final    GRAM NEGATIVE RODS ANAEROBIC BOTTLE ONLY CRITICAL RESULT CALLED TO, READ BACK BY AND VERIFIED WITH: JASON ROBBINS @0408  07/17/20 RH Performed at [facility], [place] [address], [place], [place] [address]    Culture GRAM NEGATIVE RODS  Final   Report Status PENDING  Incomplete  Blood Culture ID Panel (Reflexed)     Status: Abnormal   Collection Time: 07/16/20  2:54 PM  Result Value Ref Range Status   Enterococcus faecalis NOT DETECTED NOT DETECTED Final   Enterococcus Faecium NOT DETECTED NOT DETECTED Final   Listeria monocytogenes NOT DETECTED NOT DETECTED Final   Staphylococcus species NOT DETECTED NOT DETECTED Final   Staphylococcus aureus (BCID) NOT DETECTED NOT DETECTED Final   Staphylococcus epidermidis NOT DETECTED NOT DETECTED Final   Staphylococcus lugdunensis NOT DETECTED NOT DETECTED Final   Streptococcus species NOT DETECTED NOT DETECTED Final   Streptococcus agalactiae NOT DETECTED NOT DETECTED Final   Streptococcus pneumoniae NOT DETECTED NOT DETECTED Final   Streptococcus pyogenes NOT DETECTED NOT DETECTED Final   A.calcoaceticus-baumannii NOT DETECTED NOT DETECTED Final   Bacteroides fragilis NOT DETECTED NOT DETECTED Final   Enterobacterales DETECTED (A) NOT DETECTED Final    Comment: Enterobacterales represent a large order of gram negative bacteria, not a single organism. CRITICAL RESULT CALLED TO, READ BACK BY AND VERIFIED WITH: JASON ROBBINS @ 0408 07/17/20 RH    Enterobacter cloacae complex NOT DETECTED NOT DETECTED Final   Escherichia coli DETECTED (A) NOT DETECTED Final    Comment: CRITICAL RESULT CALLED TO, READ BACK  BY AND VERIFIED WITH: JASON ROBBINS @ 0408 07/17/20 RH    Klebsiella aerogenes NOT DETECTED NOT DETECTED Final   Klebsiella oxytoca NOT DETECTED NOT DETECTED Final   Klebsiella pneumoniae NOT DETECTED NOT DETECTED Final   Proteus species NOT DETECTED NOT DETECTED Final   Salmonella species NOT DETECTED NOT DETECTED Final   Serratia marcescens NOT DETECTED NOT DETECTED Final   Haemophilus influenzae NOT DETECTED NOT DETECTED Final   Neisseria meningitidis NOT DETECTED NOT DETECTED Final   Pseudomonas aeruginosa NOT  DETECTED NOT DETECTED Final   Stenotrophomonas maltophilia NOT DETECTED NOT DETECTED Final   Candida albicans NOT DETECTED NOT DETECTED Final   Candida auris NOT DETECTED NOT DETECTED Final   Candida glabrata NOT DETECTED NOT DETECTED Final   Candida krusei NOT DETECTED NOT DETECTED Final   Candida parapsilosis NOT DETECTED NOT DETECTED Final   Candida tropicalis NOT DETECTED NOT DETECTED Final   Cryptococcus neoformans/gattii NOT DETECTED NOT DETECTED Final   CTX-M ESBL NOT DETECTED NOT DETECTED Final   Carbapenem resistance IMP NOT DETECTED NOT DETECTED Final   Carbapenem resistance KPC NOT DETECTED NOT DETECTED Final   Carbapenem resistance NDM NOT DETECTED NOT DETECTED Final   Carbapenem resist OXA 48 LIKE NOT DETECTED NOT DETECTED Final   Carbapenem resistance VIM NOT DETECTED NOT DETECTED Final    Comment: Performed at [facility], [address], [place], [place] [address]      Imaging Studies   DG Chest 2 View  Result Date: 07/16/2020 CLINICAL DATA:  Fever. EXAM: CHEST - 2 VIEW COMPARISON:  05/27/2020 FINDINGS: The heart size and mediastinal contours are within normal limits. Both lungs are clear. The visualized skeletal structures are unremarkable. IMPRESSION: No active cardiopulmonary disease. Electronically Signed   By: Franchot Gallo M.D.   On: 07/16/2020 12:55   DG Knee 1-2 Views Right  Result Date: 07/17/2020 CLINICAL DATA:  Pain EXAM: RIGHT KNEE - 1-2 VIEW COMPARISON:  None. FINDINGS: Frontal and lateral views were obtained. No fracture, dislocation, or joint effusion. There is mild narrowing medially. Other joint spaces appear normal. There is mild spurring medially. There is a spur along the anterior superior patella. No erosive change. There is calcification in the popliteal artery. IMPRESSION: Mild narrowing medially. Mild spurring medially and along the anterior superior patella. The anterior superior patella spur may represent a degree of distal quadriceps  tendinosis. No fracture, dislocation, or effusion. Foci of arterial vascular calcification noted. Electronically Signed   By: Lowella Grip III M.D.   On: 07/17/2020 10:34     Medications   Scheduled Meds: . allopurinol  100 mg Oral Daily  . clopidogrel  75 mg Oral Daily  . diclofenac Sodium  2 g Topical QID  . entecavir  0.5 mg Oral Daily  . levothyroxine  75 mcg Oral QAC breakfast  . pantoprazole  40 mg Oral Daily  . pravastatin  40 mg Oral QPM  . predniSONE  40 mg Oral Q breakfast  . tiotropium  1 capsule Inhalation Daily  . vitamin B-12  500 mcg Oral Daily   Continuous Infusions: . sodium chloride 100 mL/hr at 07/17/20 1500  . cefTRIAXone (ROCEPHIN)  IV Stopped (07/17/20 0559)       LOS: 0 days    Time spent: 30 minutes    Ezekiel Slocumb, DO [facility]  07/17/2020, 3:58 PM    If 7PM-7AM, please contact night-coverage. How to contact the [facility] Attending or Consulting provider [place] or covering provider  during after hours 7P -7A, for this patient?    1. Check the care team in [facility] and look for a) attending/consulting [facility] provider listed and b) the [facility] team listed 2. Log into [email or web address] and use [place]'s universal password to access. If you do not have the password, please contact the hospital operator. 3. Locate the [facility] provider you are looking for under [facility] and page to a number that you can be directly reached. 4. If you still have difficulty reaching the provider, please page the [facility] (Director on Call) for the Hospitalists listed on amion for assistance.

## 2020-07-18 ENCOUNTER — Inpatient Hospital Stay: Payer: BC Managed Care – PPO

## 2020-07-18 LAB — CBC
HCT: 31.5 % — ABNORMAL LOW (ref 36.0–46.0)
Hemoglobin: 10.2 g/dL — ABNORMAL LOW (ref 12.0–15.0)
MCH: 32 pg (ref 26.0–34.0)
MCHC: 32.4 g/dL (ref 30.0–36.0)
MCV: 98.7 fL (ref 80.0–100.0)
Platelets: 84 10*3/uL — ABNORMAL LOW (ref 150–400)
RBC: 3.19 MIL/uL — ABNORMAL LOW (ref 3.87–5.11)
RDW: 12.9 % (ref 11.5–15.5)
WBC: 5.1 10*3/uL (ref 4.0–10.5)
nRBC: 0.4 % — ABNORMAL HIGH (ref 0.0–0.2)

## 2020-07-18 LAB — URINE CULTURE: Culture: >=100000 — AB

## 2020-07-18 LAB — BASIC METABOLIC PANEL
Anion gap: 9 (ref 5–15)
BUN: 32 mg/dL — ABNORMAL HIGH (ref 8–23)
CO2: 26 mmol/L (ref 22–32)
Calcium: 8.4 mg/dL — ABNORMAL LOW (ref 8.9–10.3)
Chloride: 105 mmol/L (ref 98–111)
Creatinine, Ser: 1.02 mg/dL — ABNORMAL HIGH (ref 0.44–1.00)
GFR, Estimated: >60 mL/min (ref >60)
Glucose, Bld: 84 mg/dL (ref 70–99)
Potassium: 3.6 mmol/L (ref 3.5–5.1)
Sodium: 140 mmol/L (ref 135–145)

## 2020-07-18 LAB — MAGNESIUM: Magnesium: 1.9 mg/dL (ref 1.7–2.4)

## 2020-07-18 LAB — LACTATE DEHYDROGENASE: LDH: 323 U/L — ABNORMAL HIGH (ref 98–192)

## 2020-07-18 MED ORDER — DICLOFENAC 1 % TOPICAL GEL
TOPICAL | 0.00000 days
Start: 2020-07-18 — End: ?

## 2020-07-18 MED ORDER — MUPIROCIN 2 % TOPICAL OINTMENT
Freq: Two times a day (BID) | TOPICAL | 0 days
Start: 2020-07-18 — End: ?

## 2020-07-18 MED ORDER — SULFAMETHOXAZOLE-TRIMETHOPRIM 800-160 MG PO TABS: 1.0000 | ORAL_TABLET | Freq: Two times a day (BID) | ORAL | 0 refills | Status: AC

## 2020-07-18 MED ORDER — DICLOFENAC SODIUM 1 % EX GEL
2.0000 g | Freq: Four times a day (QID) | CUTANEOUS | 2 refills | Status: DC
Start: 2020-07-18 — End: 2021-08-29

## 2020-07-18 MED ORDER — MUPIROCIN 2 % EX OINT
TOPICAL_OINTMENT | Freq: Two times a day (BID) | CUTANEOUS | 0 refills | Status: DC
Start: 2020-07-18 — End: 2020-11-17

## 2020-07-18 MED ORDER — SULFAMETHOXAZOLE-TRIMETHOPRIM 800-160 MG PO TABS: 1.0000 | ORAL_TABLET | Freq: Two times a day (BID) | ORAL | Status: DC

## 2020-07-18 MED ORDER — MUPIROCIN 2 % EX OINT
TOPICAL_OINTMENT | Freq: Two times a day (BID) | CUTANEOUS | Status: DC
  Filled 2020-07-18: qty 22

## 2020-07-18 MED ORDER — PREDNISONE 20 MG PO TABS
40.0000 mg | ORAL_TABLET | Freq: Every day | ORAL | 0 refills | Status: DC
Start: 2020-07-19 — End: 2020-08-26

## 2020-07-18 NOTE — Plan of Care (Signed)
  Problem: Urinary Elimination: Goal: Signs and symptoms of infection will decrease Outcome: Adequate for Discharge   

## 2020-07-18 NOTE — Discharge Summary (Signed)
Physician Discharge Summary  Tina Page QIO:[id number] DOB: Jun 16, 1956 DOA: 07/16/2020  PCP: Frazier Richards, MD  Admit date: 07/16/2020 Discharge date: 07/31/2020  Admitted From: home Disposition:  home  Recommendations for Outpatient Follow-up:  1. Follow up with PCP in 1-2 weeks 2. Please obtain BMP/CBC in one week 3. Please follow up with Dr. Mike Gip as previously scheduled  Home Health: No  Equipment/Devices: None   Discharge Condition: Stable  CODE STATUS: Full  Diet recommendation: Heart Healthy    Discharge Diagnoses: Principal Problem:   UTI (urinary tract infection) Active Problems:   HTN (hypertension)   PAD (peripheral artery disease) (HCC)   Thrombocytopenia (HCC)   Autoimmune hemolytic anemia (HCC)   Current chronic use of systemic steroids   Hypothyroidism   Fever   Bacteremia due to Escherichia coli    Summary of HPI and Hospital Course:  Tina Page is a 63 y.o. female with medical history significant for coronary artery disease, COPD, HTN, hypothyroidism, hepatitis C, PVD status post lower extremity stents and hemolytic anemia currently undergoing rituximab infusions who presented to the ED of 07/16/20 for evaluation of fever after receiving her Rituxan infusion on 11/15.  Tmax 101 F at home.  Evaluation revealed a UTI with concurrent bacteremia.      Acute UTI with associated bacteremia - POA.  Urine and blood cultures growing E coli.  Treated with Rocephin 2 g IV daily - continue.     --8 days Bactrim on d/c to complete 10 day course  AKI - present on admission, Cr 1.48.  Resolved with IV fluids.  BMP in follow up.  Bilateral knee pain, R>L - chronic.  Right knee xray today given no imaging recently.  Voltaren gel.  PT evaluated and recommended no PT follow up.  Hx of Peripheral Artery Disease - continue Plavix, stain  Hypothyroidism - continue Synthroid  Warm autoimmune hemolytic anemia / thrombocytopenia - follows with Dr.  Mike Gip (consulted).  On Rituxan for WAIHA, last infusion 07/15/20.  No schistocytes seen on PBS.  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   Continue prednisone 40 mg daily.  Avoid heparin products.  SCD's were used for VTE ppx.  COPD - not acutely exacerbated.  Continue Spiriva, PRN albuterol  Discharge Instructions   Discharge Instructions    Call MD for:  extreme fatigue   Complete by: As directed    Call MD for:  persistant dizziness or light-headedness   Complete by: As directed    Call MD for:  persistant nausea and vomiting   Complete by: As directed    Call MD for:  severe uncontrolled pain   Complete by: As directed    Call MD for:  temperature >100.4   Complete by: As directed    Diet - low sodium heart healthy   Complete by: As directed    Discharge instructions   Complete by: As directed    Take antiobiotic (Bactrim) twice daily for 8 more days for your UTI and blood stream infection.   Increase activity slowly   Complete by: As directed      Allergies as of 07/18/2020      Reactions   Ciprofloxacin Swelling   Facial swelling following single oral dose.    Codeine Anaphylaxis   Nsaids Other (See Comments)   PT HAS HEPC      Medication List    TAKE these medications   albuterol 108 (90 Base) MCG/ACT inhaler Commonly known as:  VENTOLIN HFA Inhale 2 puffs into the lungs every 6 (six) hours as needed for wheezing or shortness of breath.   allopurinol 100 MG tablet Commonly known as: ZYLOPRIM TAKE 1 TABLET BY MOUTH EVERY DAY   clopidogrel 75 MG tablet Commonly known as: PLAVIX Take 1 tablet (75 mg total) by mouth daily.   diclofenac Sodium 1 % Gel Commonly known as: VOLTAREN Apply 2 g topically 4 (four) times daily.   entecavir 0.5 MG tablet Commonly known as: BARACLUDE Take by mouth.   folic acid 1 MG tablet Commonly known as: FOLVITE TAKE 1 TABLET (1 MG TOTAL) BY MOUTH DAILY.   hydrochlorothiazide 25 MG tablet Commonly  known as: HYDRODIURIL Take 25 mg by mouth daily.   levothyroxine 75 MCG tablet Commonly known as: SYNTHROID Take 75 mcg by mouth daily before breakfast.   lisinopril 20 MG tablet Commonly known as: ZESTRIL Take 20 mg by mouth daily.   mupirocin ointment 2 % Commonly known as: BACTROBAN Apply topically 2 (two) times daily.   omeprazole 20 MG capsule Commonly known as: PriLOSEC Take 1 capsule (20 mg total) by mouth daily.   potassium chloride 20 MEQ packet Commonly known as: KLOR-CON Take 20 mEq by mouth daily.   pravastatin 40 MG tablet Commonly known as: PRAVACHOL Take 40 mg by mouth every evening.   predniSONE 20 MG tablet Commonly known as: DELTASONE Take 2 tablets (40 mg total) by mouth daily with breakfast. What changed: See the new instructions.   Spiriva Respimat 2.5 MCG/ACT Aers Generic drug: Tiotropium Bromide Monohydrate Inhale 2 puffs into the lungs daily for 1 day.   Stiolto Respimat 2.5-2.5 MCG/ACT Aers Generic drug: Tiotropium Bromide-Olodaterol Inhale 2 puffs into the lungs daily.   vitamin B-12 500 MCG tablet Commonly known as: CYANOCOBALAMIN Take 500 mcg by mouth daily.     ASK your doctor about these medications   sulfamethoxazole-trimethoprim 800-160 MG tablet Commonly known as: BACTRIM DS Take 1 tablet by mouth every 12 (twelve) hours for 8 days. Ask about: Should I take this medication?       Allergies  Allergen Reactions  . Ciprofloxacin Swelling    Facial swelling following single oral dose.   . Codeine Anaphylaxis  . Nsaids Other (See Comments)    PT HAS HEPC    Consultations:  Hematology/Oncology   Procedures/Studies: DG Chest 2 View  Result Date: 07/16/2020 CLINICAL DATA:  Fever. EXAM: CHEST - 2 VIEW COMPARISON:  05/27/2020 FINDINGS: The heart size and mediastinal contours are within normal limits. Both lungs are clear. The visualized skeletal structures are unremarkable. IMPRESSION: No active cardiopulmonary disease.  Electronically Signed   By: Franchot Gallo M.D.   On: 07/16/2020 12:55   DG Knee 1-2 Views Right  Result Date: 07/17/2020 CLINICAL DATA:  Pain EXAM: RIGHT KNEE - 1-2 VIEW COMPARISON:  None. FINDINGS: Frontal and lateral views were obtained. No fracture, dislocation, or joint effusion. There is mild narrowing medially. Other joint spaces appear normal. There is mild spurring medially. There is a spur along the anterior superior patella. No erosive change. There is calcification in the popliteal artery. IMPRESSION: Mild narrowing medially. Mild spurring medially and along the anterior superior patella. The anterior superior patella spur may represent a degree of distal quadriceps tendinosis. No fracture, dislocation, or effusion. Foci of arterial vascular calcification noted. Electronically Signed   By: Lowella Grip III M.D.   On: 07/17/2020 10:34   CT CHEST LUNG CANCER SCREENING LOW DOSE WO CONTRAST  Result Date: 07/24/2020  CLINICAL DATA:  Lung cancer screening. Former asymptomatic smoker. 58.75 pack-year history. EXAM: CT CHEST WITHOUT CONTRAST LOW-DOSE FOR LUNG CANCER SCREENING TECHNIQUE: Multidetector CT imaging of the chest was performed following the standard protocol without IV contrast. COMPARISON:  12/27/2019 FINDINGS: Cardiovascular: Normal heart size. No pericardial effusion identified. Aortic atherosclerosis. Coronary artery calcifications. Mediastinum/Nodes: No enlarged mediastinal, hilar, or axillary lymph nodes. Thyroid gland, trachea, and esophagus demonstrate no significant findings. Lungs/Pleura: Moderate centrilobular emphysema. No pleural effusion, airspace consolidation, or atelectasis. Scarring and volume loss within the lingula and right middle lobe. Multiple non solid and solid tiny nodules are identified bilaterally. The largest nodule is in the left upper lobe and has a non solid appearance with an equivalent diameter of 4.2 mm. Upper Abdomen: No acute abnormality.  Musculoskeletal: Mild spondylosis identified within the thoracic spine. No acute or suspicious osseous findings. IMPRESSION: 1. Lung-RADS 2, benign appearance or behavior. 2. Emphysema and aortic atherosclerosis. 3. Coronary artery calcifications. Aortic Atherosclerosis (ICD10-I70.0) and Emphysema (ICD10-J43.9). Electronically Signed   By: Kerby Moors M.D.   On: 07/24/2020 11:29       Subjective: Pt feeling well today.  No fever, chills, dysuria or other acute complaints.  Looks forward to getting home.     Discharge Exam: Vitals:   07/18/20 0757 07/18/20 1239  BP: (!) 148/70 (!) 161/73  Pulse: 64 82  Resp: 18   Temp: 97.6 F (36.4 C) (!) 97.5 F (36.4 C)  SpO2: 99% 97%   Vitals:   07/17/20 2351 07/18/20 0421 07/18/20 0757 07/18/20 1239  BP: (!) 146/58 (!) 152/72 (!) 148/70 (!) 161/73  Pulse: 75 68 64 82  Resp: 16 20 18    Temp: 98.7 F (37.1 C) 98.3 F (36.8 C) 97.6 F (36.4 C) (!) 97.5 F (36.4 C)  TempSrc: Oral Oral Oral Oral  SpO2: 97% 99% 99% 97%  Weight:      Height:        General: Pt is alert, awake, not in acute distress Cardiovascular: RRR, S1/S2 +, no rubs, no gallops Respiratory: CTA bilaterally, no wheezing, no rhonchi Abdominal: Soft, NT, ND, bowel sounds + Extremities: no edema, no cyanosis    The results of significant diagnostics from this hospitalization (including imaging, microbiology, ancillary and laboratory) are listed below for reference.     Microbiology: Recent Results (from the past 240 hour(s))  Urine Culture     Status: None   Collection Time: 07/29/20  9:15 AM   Specimen: Urine, Random  Result Value Ref Range Status   Specimen Description   Final    URINE, RANDOM Performed at [facility], [address], [place], [place] [address]    Special Requests   Final    NONE Performed at [facility], [address], [place], [place] [address]    Culture   Final    NO GROWTH Performed at [facility], [place] [address], [place], [place] [address]    Report Status 07/30/2020 FINAL  Final     Labs: BNP (last 3 results) No results for input(s): BNP in the last 8760 hours. Basic Metabolic Panel: Recent Labs  Lab 07/29/20 0808  NA 139  K 3.8  CL 105  CO2 26  GLUCOSE 93  BUN 38*  CREATININE 1.17*  CALCIUM 9.2   Liver Function Tests: Recent Labs  Lab 07/29/20 0808  AST 14*  ALT 25  ALKPHOS 48  BILITOT 0.9  PROT 6.7  ALBUMIN 3.9   No  results for input(s): LIPASE, AMYLASE in the last 168 hours. No results for input(s): AMMONIA in the last 168 hours. CBC: Recent Labs  Lab 07/29/20 0808  WBC 8.5  NEUTROABS 6.7  HGB 11.8*  HCT 36.5  MCV 98.4  PLT 126*   Cardiac Enzymes: No results for input(s): CKTOTAL, CKMB, CKMBINDEX, TROPONINI in the last 168 hours. BNP: Invalid input(s): POCBNP CBG: No results for input(s): GLUCAP in the last 168 hours. D-Dimer No results for input(s): DDIMER in the last 72 hours. Hgb A1c No results for input(s): HGBA1C in the last 72 hours. Lipid Profile No results for input(s): CHOL, HDL, LDLCALC, TRIG, CHOLHDL, LDLDIRECT in the last 72 hours. Thyroid function studies No results for input(s): TSH, T4TOTAL, T3FREE, THYROIDAB in the last 72 hours.  Invalid input(s): FREET3 Anemia work up Recent Labs    07/29/20 0808  FOLATE 20.7   Urinalysis    Component Value Date/Time   COLORURINE YELLOW 07/29/2020 0915   APPEARANCEUR CLEAR 07/29/2020 0915   LABSPEC 1.025 07/29/2020 0915   PHURINE 5.0 07/29/2020 0915   GLUCOSEU NEGATIVE 07/29/2020 0915   HGBUR NEGATIVE 07/29/2020 0915   BILIRUBINUR NEGATIVE 07/29/2020 0915   KETONESUR NEGATIVE 07/29/2020 0915   PROTEINUR NEGATIVE 07/29/2020 0915   NITRITE NEGATIVE 07/29/2020 0915   LEUKOCYTESUR NEGATIVE 07/29/2020 0915   Sepsis Labs Invalid input(s): PROCALCITONIN,  WBC,  LACTICIDVEN Microbiology Recent Results (from the past 240 hour(s))  Urine Culture     Status: None    Collection Time: 07/29/20  9:15 AM   Specimen: Urine, Random  Result Value Ref Range Status   Specimen Description   Final    URINE, RANDOM Performed at [facility], [address], [place], [place] [address]    Special Requests   Final    NONE Performed at [facility], [address], [place], [place] [address]    Culture   Final    NO GROWTH Performed at [facility], [place] [address], [place], [place] [address]    Report Status 07/30/2020 FINAL  Final     Time coordinating discharge: Over 30 minutes  SIGNED:   Ezekiel Slocumb, DO [facility] 07/31/2020, 9:11 PM   If 7PM-7AM, please contact night-coverage [email or web address]

## 2020-07-19 LAB — CULTURE, BLOOD (ROUTINE X 2)

## 2020-07-23 ENCOUNTER — Other Ambulatory Visit: Payer: BC Managed Care – PPO

## 2020-07-24 ENCOUNTER — Other Ambulatory Visit: Payer: Self-pay | Admitting: Hematology and Oncology

## 2020-07-24 ENCOUNTER — Inpatient Hospital Stay: Payer: BC Managed Care – PPO

## 2020-07-24 ENCOUNTER — Telehealth: Payer: Self-pay | Admitting: Hematology and Oncology

## 2020-07-24 ENCOUNTER — Ambulatory Visit
Admission: RE | Admit: 2020-07-24 | Discharge: 2020-07-24 | Disposition: A | Discharge status: Home / Self Care | Payer: BC Managed Care – PPO | Source: Ambulatory Visit | Attending: Oncology | Admitting: Oncology

## 2020-07-24 ENCOUNTER — Other Ambulatory Visit: Payer: Self-pay

## 2020-07-24 ENCOUNTER — Inpatient Hospital Stay (HOSPITAL_BASED_OUTPATIENT_CLINIC_OR_DEPARTMENT_OTHER): Payer: BC Managed Care – PPO | Admitting: Oncology

## 2020-07-24 DIAGNOSIS — R918 Other nonspecific abnormal finding of lung field: Secondary | ICD-10-CM | POA: Diagnosis not present

## 2020-07-24 DIAGNOSIS — Z7902 Long term (current) use of antithrombotics/antiplatelets: Secondary | ICD-10-CM | POA: Diagnosis not present

## 2020-07-24 DIAGNOSIS — D591 Autoimmune hemolytic anemia, unspecified: Secondary | ICD-10-CM

## 2020-07-24 DIAGNOSIS — Z122 Encounter for screening for malignant neoplasm of respiratory organs: Secondary | ICD-10-CM | POA: Diagnosis present

## 2020-07-24 DIAGNOSIS — Z87891 Personal history of nicotine dependence: Secondary | ICD-10-CM | POA: Diagnosis not present

## 2020-07-24 DIAGNOSIS — D5911 Warm autoimmune hemolytic anemia: Secondary | ICD-10-CM | POA: Diagnosis present

## 2020-07-24 DIAGNOSIS — Z833 Family history of diabetes mellitus: Secondary | ICD-10-CM | POA: Diagnosis not present

## 2020-07-24 DIAGNOSIS — E538 Deficiency of other specified B group vitamins: Secondary | ICD-10-CM | POA: Diagnosis not present

## 2020-07-24 DIAGNOSIS — B1910 Unspecified viral hepatitis B without hepatic coma: Secondary | ICD-10-CM | POA: Diagnosis not present

## 2020-07-24 DIAGNOSIS — B9620 Unspecified Escherichia coli [E. coli] as the cause of diseases classified elsewhere: Secondary | ICD-10-CM

## 2020-07-24 DIAGNOSIS — Z8249 Family history of ischemic heart disease and other diseases of the circulatory system: Secondary | ICD-10-CM | POA: Diagnosis not present

## 2020-07-24 DIAGNOSIS — Z79899 Other long term (current) drug therapy: Secondary | ICD-10-CM | POA: Diagnosis not present

## 2020-07-24 LAB — BASIC METABOLIC PANEL
Anion gap: 8 (ref 5–15)
BUN: 46 mg/dL — ABNORMAL HIGH (ref 8–23)
CO2: 23 mmol/L (ref 22–32)
Calcium: 9.3 mg/dL (ref 8.9–10.3)
Chloride: 105 mmol/L (ref 98–111)
Creatinine, Ser: 1.77 mg/dL — ABNORMAL HIGH (ref 0.44–1.00)
GFR, Estimated: 32 mL/min — ABNORMAL LOW (ref >60)
Glucose, Bld: 160 mg/dL — ABNORMAL HIGH (ref 70–99)
Potassium: 5.3 mmol/L — ABNORMAL HIGH (ref 3.5–5.1)
Sodium: 136 mmol/L (ref 135–145)

## 2020-07-24 LAB — CBC WITH DIFFERENTIAL/PLATELET
Abs Immature Granulocytes: 0.25 10*3/uL — ABNORMAL HIGH (ref 0.00–0.07)
Basophils Absolute: 0 10*3/uL (ref 0.0–0.1)
Basophils Relative: 0 %
Eosinophils Absolute: 0 10*3/uL (ref 0.0–0.5)
Eosinophils Relative: 0 %
HCT: 38 % (ref 36.0–46.0)
Hemoglobin: 12.4 g/dL (ref 12.0–15.0)
Immature Granulocytes: 3 %
Lymphocytes Relative: 6 %
Lymphs Abs: 0.6 10*3/uL — ABNORMAL LOW (ref 0.7–4.0)
MCH: 31.7 pg (ref 26.0–34.0)
MCHC: 32.6 g/dL (ref 30.0–36.0)
MCV: 97.2 fL (ref 80.0–100.0)
Monocytes Absolute: 0.2 10*3/uL (ref 0.1–1.0)
Monocytes Relative: 2 %
Neutro Abs: 8.6 10*3/uL — ABNORMAL HIGH (ref 1.7–7.7)
Neutrophils Relative %: 89 %
Platelets: 176 10*3/uL (ref 150–400)
RBC: 3.91 MIL/uL (ref 3.87–5.11)
RDW: 14.3 % (ref 11.5–15.5)
WBC: 9.7 10*3/uL (ref 4.0–10.5)
nRBC: 0 % (ref 0.0–0.2)

## 2020-07-24 MED ORDER — CEPHALEXIN 500 MG PO CAPS: 500.0000 mg | ORAL_CAPSULE | Freq: Four times a day (QID) | ORAL | 0 refills | Status: DC

## 2020-07-24 NOTE — Telephone Encounter (Signed)
Re:  Labs  Today I reviewed her labs today.  CBC has improved.  She has been on prednisone 30 mg a day for the past 10 days.   We discussed decreasing her prednisone to 20 mg a day.  We discussed her electrolytes.  Potassium is elevated at 5.3.  She states that another physician prescribed potassium 20 mEq a day. She was instructed to discontinue her potassium.  We discussed an increase in her creatinine from 1.0 to 1.77.  Per pharmacy, her creatinine clearance is 39.6 ml/minute. Her entecavir will be decreased to 1 pill every other day.  The etiology of her increased creatinine is likely due to Septra.  She has taking Septra DS 1 tab p.o. twice daily.   Her last day of Septra is Friday, 07/26/2020.  We discussed discontinuation of Septra.  Culture and sensitivity data for her E. coli UTI were reviewed  She is allergic to ciprofloxacin.  We discussed initiation of Keflex 500 mg p.o. every 6 hours to complete her antibiotic course (2-1/2 days). I spoke with Dr. Delaine Lame, infectious disease, who concurred.  I encouraged her to drink plenty of fluids.  She will be seen in clinic on Monday, 07/29/2020.   If she has any concerns over the holiday weekend she is to contact the clinic.   Lequita Asal, MD

## 2020-07-24 NOTE — Progress Notes (Signed)
Virtual Visit via Video Note  I connected with  on 07/24/20 at 10:15 AM EST by a video enabled telemedicine application and verified that I am speaking with the correct person using two identifiers.  Location: Patient: [facility] Provider: Clinic    I discussed the limitations of evaluation and management by telemedicine and the availability of in person appointments. The patient expressed understanding and agreed to proceed.  I discussed the assessment and treatment plan with the patient. The patient was provided an opportunity to ask questions and all were answered. The patient agreed with the plan and demonstrated an understanding of the instructions.   The patient was advised to call back or seek an in-person evaluation if the symptoms worsen or if the condition fails to improve as anticipated.   In accordance with CMS guidelines, patient has met eligibility criteria including age, absence of signs or symptoms of lung cancer.  Social History   Tobacco Use  . Smoking status: Former Smoker    Packs/day: 1.25    Years: 47.00    Pack years: 58.75    Types: Cigarettes    Quit date: 07/11/2016    Years since quitting: 4.0  . Smokeless tobacco: Never Used  Vaping Use  . Vaping Use: Never used  Substance Use Topics  . Alcohol use: No  . Drug use: No      A shared decision-making session was conducted prior to the performance of CT scan. This includes one or more decision aids, includes benefits and harms of screening, follow-up diagnostic testing, over-diagnosis, false positive rate, and total radiation exposure.   Counseling on the importance of adherence to annual lung cancer LDCT screening, impact of co-morbidities, and ability or willingness to undergo diagnosis and treatment is imperative for compliance of the program.   Counseling on the importance of continued smoking cessation for former smokers; the importance of smoking cessation for current smokers, and information about  tobacco cessation interventions have been given to patient including [place] and [employer] quit [place] programs.   Written order for lung cancer screening with LDCT has been given to the patient and any and all questions have been answered to the best of my abilities.    Yearly follow up will be coordinated by Burgess Estelle, Thoracic Navigator.  I provided 15 minutes of face-to-face video visit time during this encounter, and > 50% was spent counseling as documented under my assessment & plan.   Jacquelin Hawking, NP

## 2020-07-28 NOTE — Progress Notes (Signed)
[facility]  [address] [place], [place] [address] Phone: [phone]  Fax: [phone]   Clinic Day:  07/29/2020  Referring physician: Frazier Richards, MD  Chief Complaint: Tina Page is a 64 y.o. female with recurrent warm autoimmune hemolytic anemia and renal insufficiency who is seen for assessment prior to day 15 Rituxan.  HPI: The patient was last seen in the hematology clinic on 07/15/2020. At that time,  she felt "ok".  She reported easy bruising.  She denies any change in urine color.  She had insomnia secondary to steroids.  She was on prednisone 50 mg a day. Hematocrit was 40.1, hemoglobin 13.1, platelets 100,000, WBC 7,400. Creatinine was 1.24 (CrCl 49 ml/min). LDH was 262.  Prednisone was decreased to 40 mg a day.  She received day 1 Rituxan.  She was admitted to [facility] from 07/16/2020 - 07/18/2020 with a fever and UTI.  Blood and urine culture grew E coli.  She received ceftriaxone and was discharged on Septra DS 1 tablet BID.  CBC on 07/18/2020 revealed a hematocrit of 31.5, hemoglobin 10.2, MCV 98.7, platelets 84,000, WBC 5100.  Low dose chest CT on 07/24/2020 revealed Lung-RADS 2, benign appearance or behavior. There was emphysema, aortic atherosclerosis, and coronary artery calcifications.  Labs on 07/24/2020 revealed a hematocrit of 38.0, hemoglobin 12.4, platelets 176,000, WBC 9,700.  Potassium was 5.3. Creatinine was 1.77 (CrCl 32 ml/min).  She was contacted on the phone.  She had been on prednisone 30 mg a day x 10 days. Prednisone was decreased to 20 mg a day.   Oral potassium supplementation was discontinued.  Entecavir was decreased to 0.5 mg QOD.  The etiology of her increased creatinine was felt secondary to Septra.  Septra was discontinued and Kelflex 500 mg po q 6 hours was started to complete her antibiotic course.  During the interim, she has been "ok." She finished Keflex 3 days ago. She discontinued potassium. She takes  entecavir every other day and prednisone 20 mg. She is still holding folic acid.  Symptomatically, she reports lower back pain. She denies any recent trauma or injury to the area. She becomes fatigued halfway through the day. She reports a runny nose. She denies fevers, chills, sweats, vision changes, headache, sore throat, shortness of breath, chest pain, palpitations, nausea, vomiting, diarrhea, and urinary symptoms.    Past Medical History:  Diagnosis Date  . Anemia   . Atherosclerosis of native arteries of extremity with intermittent claudication ([place]) 07/20/2016  . COPD (chronic obstructive pulmonary disease) ([place])   . 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 Elevated liver function tests 11/03/2017  . GERD (gastroesophageal reflux disease)   . Heme positive stool 01/29/2015  . Hepatitis C 07/12/2016  . HLD (hyperlipidemia)   . Hypertension   . Hypothyroidism   . 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 Thrombocytopenia ([place]) 10/04/2016    Past Surgical History:  Procedure Laterality Date  . ELECTROMAGNETIC NAVIGATION BROCHOSCOPY N/A 12/07/2017   Procedure: ELECTROMAGNETIC NAVIGATION BRONCHOSCOPY;  Surgeon: Flora Lipps, MD;  Location: [facility] ORS;  Service: Cardiopulmonary;  Laterality: N/A;  . ESOPHAGOGASTRODUODENOSCOPY (EGD) WITH PROPOFOL N/A 07/08/2016   Procedure: ESOPHAGOGASTRODUODENOSCOPY (EGD) WITH PROPOFOL;  Surgeon: Manya Silvas, MD;  Location: [facility] ENDOSCOPY;  Service: Endoscopy;  Laterality: N/A;  . KNEE SURGERY Right    repair of acl tear  . PERIPHERAL VASCULAR CATHETERIZATION N/A 07/10/2016   Procedure: Lower Extremity Angiography;  Surgeon: Belenda Cruise  Eloise Levels, MD;  Location: [facility] CV LAB;  Service: Cardiovascular;  Laterality: N/A;  . PERIPHERAL VASCULAR CATHETERIZATION N/A 07/10/2016   Procedure: Abdominal Aortogram w/Lower Extremity;  Surgeon: Katha Cabal, MD;  Location: [place] [facility] LAB;  Service: Cardiovascular;   Laterality: N/A;  . PERIPHERAL VASCULAR CATHETERIZATION  07/10/2016   Procedure: Lower Extremity Intervention;  Surgeon: Katha Cabal, MD;  Location: [facility];  Service: Cardiovascular;;    Family History  Problem Relation Age of Onset  . Diabetes Mother   . Hypertension Mother   . Diabetes Maternal Grandfather   . Hypertension Maternal Grandfather   . Breast cancer Neg Hx     Social History:  reports that she quit smoking about 4 years ago. Her smoking use included cigarettes. She has a 58.75 pack-year smoking history. She has never used smokeless tobacco. She reports that she does not drink alcohol and does not use drugs. She has a 21 pack year smoking history (1/2 pack/day from age [age]). The patient works at [employer] She is exposed to cold temperatures. She works 3rd shift. She has a daughter, [name] and a daughter named [name]. She lives in [place]. The patient is accompanied by a family member today.  Allergies:  Allergies  Allergen Reactions  . Ciprofloxacin Swelling    Facial swelling following single oral dose.   . Codeine Anaphylaxis  . Nsaids Other (See Comments)    PT HAS HEPC    Current Medications: Current Outpatient Medications  Medication Sig Dispense Refill  . albuterol (PROVENTIL HFA;VENTOLIN HFA) 108 (90 Base) MCG/ACT inhaler Inhale 2 puffs into the lungs every 6 (six) hours as needed for wheezing or shortness of breath.     . allopurinol (ZYLOPRIM) 100 MG tablet TAKE 1 TABLET BY MOUTH EVERY DAY (Patient taking differently: Take 100 mg by mouth daily. ) 30 tablet 5  . clopidogrel (PLAVIX) 75 MG tablet Take 1 tablet (75 mg total) by mouth daily. 30 tablet 5  . cyanocobalamin 500 MCG tablet Take 500 mcg by mouth daily.    . diclofenac Sodium (VOLTAREN) 1 % GEL Apply 2 g topically 4 (four) times daily. 100 g 2  . entecavir (BARACLUDE) 0.5 MG tablet Take by mouth.    . folic acid (FOLVITE) 1 MG tablet TAKE 1 TABLET (1 MG  TOTAL) BY MOUTH DAILY. 90 tablet 1  . hydrochlorothiazide (HYDRODIURIL) 25 MG tablet Take 25 mg by mouth daily.    [name] Kitchen levothyroxine (SYNTHROID, LEVOTHROID) 75 MCG tablet Take 75 mcg by mouth daily before breakfast.     . lisinopril (PRINIVIL,ZESTRIL) 20 MG tablet Take 20 mg by mouth daily.     . mupirocin ointment (BACTROBAN) 2 % Apply topically 2 (two) times daily. 22 g 0  . omeprazole (PRILOSEC) 20 MG capsule Take 1 capsule (20 mg total) by mouth daily. 60 capsule 0  . pravastatin (PRAVACHOL) 40 MG tablet Take 40 mg by mouth every evening.    . predniSONE (DELTASONE) 20 MG tablet Take 2 tablets (40 mg total) by mouth daily with breakfast. (Patient taking differently: Take 20 mg by mouth daily with breakfast. ) 30 tablet 0  . Tiotropium Bromide Monohydrate (SPIRIVA RESPIMAT) 2.5 MCG/ACT AERS Inhale 2 puffs into the lungs daily for 1 day. 1 each 0  . Tiotropium Bromide-Olodaterol (STIOLTO RESPIMAT) 2.5-2.5 MCG/ACT AERS Inhale 2 puffs into the lungs daily.    . potassium chloride (KLOR-CON) 20 MEQ packet Take 20 mEq by mouth daily. (Patient not  taking: Reported on 07/29/2020)     No current facility-administered medications for this visit.    Review of Systems  Constitutional: Positive for malaise/fatigue. Negative for chills, diaphoresis, fever and weight loss (up 6 lbs).       Feels "okay."  HENT: Negative for congestion, ear discharge, ear pain, hearing loss, nosebleeds, sinus pain, sore throat and tinnitus.        Runny nose.  Eyes: Negative for blurred vision.  Respiratory: Negative for cough, hemoptysis, sputum production and shortness of breath.   Cardiovascular: Negative for chest pain, palpitations and leg swelling.  Gastrointestinal: Negative for abdominal pain, blood in stool, constipation, diarrhea, heartburn, melena, nausea and vomiting.       IBS  Genitourinary: Negative for dysuria, frequency, hematuria and urgency.  Musculoskeletal: Negative for back pain, joint pain, myalgias  and neck pain.  Skin: Negative for itching and rash.  Neurological: Negative for dizziness, tingling, sensory change, weakness and headaches.  Endo/Heme/Allergies: Does not bruise/bleed easily.  Psychiatric/Behavioral: Negative for depression and memory loss. The patient is not nervous/anxious and does not have insomnia.   All other systems reviewed and are negative.  Performance status (ECOG): 1  Vital Signs Blood pressure 116/72, pulse 86, temperature 98.2 F (36.8 C), temperature source Oral, weight 174 lb 9.7 oz (79.2 kg), SpO2 100 %.  Physical Exam Vitals and nursing note reviewed.  Constitutional:      General: She is not in acute distress.    Appearance: She is not diaphoretic.  HENT:     Head: Normocephalic and atraumatic.     Comments: Long gray hair.    Mouth/Throat:     Mouth: Mucous membranes are moist.     Pharynx: Oropharynx is clear. No oropharyngeal exudate or posterior oropharyngeal erythema.     Comments: Cold sore. Eyes:     General: No scleral icterus.    Extraocular Movements: Extraocular movements intact.     Conjunctiva/sclera: Conjunctivae normal.     Pupils: Pupils are equal, round, and reactive to light.     Comments: Dark rimmed glasses.  Cardiovascular:     Rate and Rhythm: Normal rate and regular rhythm.     Heart sounds: Normal heart sounds. No murmur heard.   Pulmonary:     Effort: Pulmonary effort is normal. No respiratory distress.     Breath sounds: Normal breath sounds. No wheezing or rales.  Chest:     Chest wall: No tenderness.  Abdominal:     General: Bowel sounds are normal. There is no distension.     Palpations: Abdomen is soft. There is no hepatomegaly, splenomegaly or mass.     Tenderness: There is no abdominal tenderness. There is no guarding or rebound.  Musculoskeletal:        General: No swelling or tenderness. Normal range of motion.     Cervical back: Normal range of motion and neck supple.  Lymphadenopathy:     Head:      Right side of head: No preauricular, posterior auricular or occipital adenopathy.     Left side of head: No preauricular, posterior auricular or occipital adenopathy.     Cervical: No cervical adenopathy.     Upper Body:     Right upper body: No supraclavicular or axillary adenopathy.     Left upper body: No supraclavicular or axillary adenopathy.     Lower Body: No right inguinal adenopathy. No left inguinal adenopathy.  Skin:    General: Skin is warm and dry.  Coloration: Skin is not jaundiced.  Neurological:     Mental Status: She is alert and oriented to person, place, and time.  Psychiatric:        Behavior: Behavior normal.        Thought Content: Thought content normal.        Judgment: Judgment normal.    Imaging studies: 07/07/2016:  Chest, abdomen, and pelvis CTangiogram revealed moderate diffuse atherosclerotic vascular disease of the abdominal aorta with severe stenosis of the left common iliac artery with suspected short segment occlusion. There was no adenopathy. Spleen was normal.  04/15/2017:  Abdominal ultrasoundrevealed a normal spleen and sludge in the gallbladder. The liver was echogenic consistent fatty infiltration and/or hepatocellular disease. 11/10/2017:  Abdomen and pelvic CTrevealed a 2.2 cm spiculated density in right middle lobe concerning for malignancy.  11/16/2017:  Chest CTon 11/16/2017 revealed a 2.2 x 2.0 x 2.4 spiculated RIGHT middle lobe mass. There was tiny nonspecific upper lobe pulmonary nodules greater on RIGHT, largest 3 mm, of uncertain etiology. There was additional 8 x 8 mm opacity in the posterior sulcus of the RIGHT lower lobe. 11/22/2017:  PET scanrevealed a 2 cm hypermetabolic right middle lobe lung mass (SUV 3.4) consistent with primary lung neoplasm. There were no findings for mediastinal/hilar lymphadenopathy or metastatic disease. There were areas of hypermetabolism involving the left oblique abdominal muscles and the  anorectal junction. 02/06/2018:  Chest CT revealed the spiculated 2.2 cm right middle lobe lesion was almost completely resolved with only a residual 4 mm irregular nodule identified at this location on a background of architectural distortion/evolving scar. There was a 5 mm right parahilar nodule stable since 11/16/2017(not present on 07/07/2016). There were tiny nodules in both upper lobes suggesting inhalation etiology. 06/27/2019:  Chest CT revealed the nodule of the right middle lobe had almost completely resolved, with residual linear scarring. There was persistent paramedian consolidation of the medial right middle lobe and lingula, generally in keeping with atypical infection, particularly atypical mycobacterium. There had been interval increase in size of a small nodule of the right middle lobe, now measuring 4 mm (previously 2 mm).   There was new 6 mm ground-glass opacity of the lateral left upper lobe measuring.  Although nonspecific, these were likely infectious or inflammatory in nature.  Attention on follow-up was receommended. There was unchanged tiny biapical pulmonary nodules, benign sequelae of nonspecific infection or inflammation, possibly related cigarette smoking, atypical infection, or other inhalational, inflammatory lung disease. There was hepatic steatosis. 12/26/2019:  Chest CT on 12/26/2019 revealed stable sub-cm bilateral pulmonary nodules, consistent with benign etiology. There was stable paramedian consolidation in the right middle lobe and lingula. There was a new airspace disease in inferior right middle lobe, with a few adjacent subcentimeter nodules. Overall, above findings were consistent with waxing and waning atypical infectious process, such as MAI.   07/24/2020:  Low dose chest CT revealed Lung-RADS 2, benign appearance or behavior. There was emphysema, aortic atherosclerosis, and coronary artery calcifications.   Appointment on 07/29/2020  Component Date Value Ref  Range Status  . WBC 07/29/2020 8.5  4.0 - 10.5 K/uL Final  . RBC 07/29/2020 3.71* 3.87 - 5.11 MIL/uL Final  . Hemoglobin 07/29/2020 11.8* 12.0 - 15.0 g/dL Final  . HCT 07/29/2020 36.5  36 - 46 % Final  . MCV 07/29/2020 98.4  80.0 - 100.0 fL Final  . MCH 07/29/2020 31.8  26.0 - 34.0 pg Final  . MCHC 07/29/2020 32.3  30.0 - 36.0 g/dL Final  .  RDW 07/29/2020 14.5  11.5 - 15.5 % Final  . Platelets 07/29/2020 126* 150 - 400 K/uL Final  . nRBC 07/29/2020 0.0  0.0 - 0.2 % Final  . Neutrophils Relative % 07/29/2020 79  % Final  . Neutro Abs 07/29/2020 6.7  1.7 - 7.7 K/uL Final  . Lymphocytes Relative 07/29/2020 14  % Final  . Lymphs Abs 07/29/2020 1.2  0.7 - 4.0 K/uL Final  . Monocytes Relative 07/29/2020 5  % Final  . Monocytes Absolute 07/29/2020 0.5  0.1 - 1.0 K/uL Final  . Eosinophils Relative 07/29/2020 0  % Final  . Eosinophils Absolute 07/29/2020 0.0  0.0 - 0.5 K/uL Final  . Basophils Relative 07/29/2020 0  % Final  . Basophils Absolute 07/29/2020 0.0  0.0 - 0.1 K/uL Final  . Immature Granulocytes 07/29/2020 2  % Final  . 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 One week prior to presentation, she felt like she was coming down with something. She denied any fever, runny nose, sore throat or cough. She had some diarrhea. She denied any new medications or herbal products.  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 Reticulocyte count was 11.3%. Ferritin was 562. Iron studies revealed a saturation of 20% and a TIBC of 214 (low). B12 was 231 (low normal). Folate was 42. Peripheral smear revealed rouleaux formation. Labs on 06/21/2019revealed normal flow cytometry, SPEP, and immunoglobulins.  Additional  testingincluded the following+ studies: hepatitis C antibody,hepatitis B core antibody,CMV IgM, and EBV VCA (IgM and IgG). Hepatitis B by PCRwas negative. Hepatitis C RNAwas negative. Mycoplasma pneumonia IgM was negative. Reticulocyte count was 11.3% (high) indicating appropriate marrow response. C3 and C4 were normal. Cold agglutinin titer was negative x 2. Negative studies included: ANA, hepatitis B surface antigen, SPEP, and free light chain ratio. 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 MMA was initially 330 (normal). Repeat MMA was elevated on 08/01/2016 confirming B12 deficiency. Hepatitis B surface antibody was positive and hepatitis B surface antigen was negative on 08/06/2016.  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 There was no adenopathy. Spleen was normal. Abdominal ultrasoundon 04/15/2017 revealed a normal spleen and sludge in the gallbladder. The liver was echogenic consistent fatty infiltration and/or hepatocellular disease.  She underwent PTCA and stent placementin right and left common iliac arteries and left external iliac artery on 07/10/2016.She is on Plavix.EGD on 03/19/2015 revealed gastritis in the body and antrum. Colonoscopyon 03/19/2015 revealed one hyperplastic polyp. EGDon 07/08/2016 was normal. No evidence of bleeding.  She has received6 units of warmed PRBCsto date (last on 08/01/2016).If Rituxan is needed, she requires entecavir 0.5 mg q day beginning 2 weeks prior to Rituxan. In addition, hepatitis B viral level and LFTs should be checked monthly.  She began steroids(1 mg/kg) on 08/01/2016. She stopped prednisoneon 03/12/2017. She is onfolic acid1 mg a day.   She hasB12 deficiency. B12 was 231 on  07/09/2016. She is on oral B12. B12 and folate were normal on 03/09/2017 and 10/12/2017.  She developed peri-oral and intranasalherpes simplex-1. She was treated with valacyclovir and doxycycline on 10/19/2016.  She developed a transient elevated alkaline phosphataseon 10/19/2016 which subsequently normalized.  She was documented to have awarm autoantibodyon 10/12/2017. LDH and bilirubin were normal. Hematocrit improved. She began prednisone10 mg on 12/07/2017 (discontinued 03/2018).She received Rituxanweekly x 4 (last dose 06/16/2018).  Entecavir was discontinued on 07/06/2019.  She developedflu like symptomson 10/26/2017. Symptoms included cough, myalgias, and fever (tmax unknown). She was prescribed Mucinex, Tamiflu, and amoxicillin. She only took amoxicillin x 5 days. She developedincreased liver function testson 11/02/2017. CMV IgG was positive. EBV VCA IgG, NA IgG, early antigen antibody IgG were elevated. EBV VCA IgM was <36. Testing was c/w a convalescence/past infection or reactivated infection. LFTs normalized on 11/15/2017. Smooth muscle antibodywas 39 (high) on 11/10/2017 and 34 (high) on 11/30/2017.   Labs on 06/12/2020 revealed recurrent anemia.  Hematocrit was 27.6, hemoglobin 8.4, MCV 104.5, platelets 138,000, WBC 3,400 (ANC 2,300). Creatinine was 1.17 (CrCl 50 ml/min).  Normal labs included ferritin (434), iron studies, B12 (885), folate (> 100).  Coombs polyspecific AGH test was positive (warm autoantibody).  Reticulocyte count was 7.1%.  LDH was 204 (98-192).  Peripheral smear revealed macrocytic anemia with mild thrombocytopenia. The morphology of RBCs, WBCs, and platelets were within normal limits. There was no evidence of circulating blasts or schistocytes.  She began prednisone 1 mg/kg (80 mg) on 06/16/2020.  Prednisone was decreased to 20 mg a day on 07/24/2020.  She began Rituxan 1000 mg on day 1 and 15 (07/15/2020).  She began entecavir on  06/28/2020.   Chest CTon 11/16/2017 revealed a 2.2 x 2.0 x 2.4 spiculated RIGHT middle lobe mass. There was tiny nonspecific upper lobe pulmonary nodules greater on RIGHT, largest 3 mm, of uncertain etiology. There was additional 8 x 8 mm opacity in the posterior sulcus of the RIGHT lower lobe. PET scanon 11/22/2017 revealed a 2 cm hypermetabolic right middle lobe lung mass (SUV 3.4) consistent with primary lung neoplasm. There were no findings for mediastinal/hilar lymphadenopathy or metastatic disease. There were areas of hypermetabolism involving the left oblique abdominal muscles and the anorectal junction.  PFTs on 11/18/2017 revealed an FEV1 of 1.22 liters (51%). DLCO adj was 6.8 mL/mmHg/min (32%).  ENB done on 12/07/2017. Cytology was negative for malignancy. Pathologydemonstrated organizing pneumonia and chronic bronchitis. Pathologist commented that organizing pneumonia spanned about 2 mm in one fragment. Cases of focal organizing pneumonia with hypermetabolism on FDG-PET have been described, but changes of this type can also be adjacent to a neoplasm. Patient prescribed a daily dose of Prednisone 10 mg, with re-imaging planned in 6-8 weeks.   Low dose chest CT on 07/24/2020 revealed Lung-RADS 2, benign appearance or behavior. There was emphysema, aortic atherosclerosis, and coronary artery calcifications.   She has diarrhea. 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 She received azithromycin x 3 days. She received ciprofloxacin x 10 days without improvement. She has seen ID in [place]. She began Bactrim on 03/04/2018 (discontinued on 03/11/2018) secondary to increase in creatine.  She has renal insufficiency. Creatininehas been followed: 1.17 on 11/05/2017, 1.85 on 11/09/2017, 1.38 on 11/12/2017, 1.74 on 11/15/2017, 1.66 on 11/17/2017, 1.48 on 11/23/2017, and 1.17 on 07/29/2020. Urinalysis on 11/15/2017 revealed revealed no hemoglobin,  bilirubin or active sediment.  Liver function tests increased on 02/15/2019.  Work-up on 02/16/2019 revealed hepatitis B E antibody positive.  Negative studies included: hepatitis A total antibody, hepatitis A IgM, hepatitis B surface antigen, hepatitis B E antigen.  Hepatitis B DNA was not detected.  Folate was 80.5 and vitamin B12  was 443.  She has received her vaccinations:  She has completed the PCV13, PPSV23, and HiB.  She received Menvio (quadrivalent meningoccal vaccine) and Bexero (univalent serogroup B meningoccal vaccine) on 07/06/2019.  She was admitted to [facility] from 07/16/2020 - 07/18/2020 with a fever and UTI.  Blood and urine culture grew E coli.  She received ceftriaxone and was discharged on Septra DS 1 tablet BID.  Septra was switched to Keflex secondary to renal insufficiency.  She notes that she tested positive for COVID-19 on 08/21/2019.  She received the Moderna COVID-19 vaccine on 11/10/2019 and 12/08/2019.   Symptomatically, she has been "ok."  She becomes fatigued 1/2 way through the day.  She has a runny nose.  She finished Keflex 3 days ago.  She denies any fever.  She is taking entecavir every other day and prednisone 20 mg/day. She holding folic acid.  Plan: 1.   Labs today: CBC with diff, CMP, LDH, folate. 2.Warm autoimmune hemolytic anemia Clinically, she is doing well.  She active warm autoimmune hemolytic anemia responding well to Rituxan and a steroid tpaer.       Hematocrit35.4. Hemoglobin 11.4.LDH 174 (normal) on 12/28/2019.[name] Kitchen Hematocrit 27.6.  Hemoglobin   8.4.  LDH 204 (98-192) on 06/12/2020.   Work-up confirmed warm autoantibodies.  Hematocrit 30.4.  Hemoglobin   9.2.  LDH 164 (98-192) on 06/19/2020.   She began prednisone 1 mg/kg (80 mg) on 06/16/2020.  Hematocrit 36.5.  Hemoglobin 11.8.  MCV   98.4 on 07/29/2020.   She is on prednisone 20 mg a day.  LDH is 262.  She is s/p day 1 of Rituxan on 07/15/2020.  Labs reviewed.  Begin day 15  of Rituxan today.     Continue entecavir.  Continue prednisone 20 mg a day. 3.Hepatitis B Patient was previously exposed to hepatitis B. She was on entecavir x 1 year when she previously received Rituxan (stopped 07/06/2019). Patient did not complete her meningococcal vaccines.  LFTS are normal.  Patient began entecavir 0.5 mg daily on 06/28/2020.  Increase entecavir to 0.5 mg a day.   Dose adjusted based on renal function.   CrCl 30-49 ml/min   50% dosing (0.5 mg QOD).   CrCl >= 50 ml/min 100% dosing (0.5 mg a day).  Continue to monitor closely with ABC. 4.B12 deficiency B12 was 443 on 02/15/2019 and 885 on 06/12/2020.  Folate was 80.5 on 02/15/2019 and >100 on 06/12/2020.   Folate is 20.7 today.  Restart folate 3 days a week (Mondays, Wednesdays and Fridays). 5. Right middle lobe nodule Chest CT on 06/27/2019 had increased from 2 to 4 mm (unclear significance).   LUL 6 mm ground glass opacity (new).  Chest CT on 12/26/2019 revealed stable sub-cm bilateral pulmonary nodules c/w benign etiology.    There was a new airspace disease in inferior right middle lobe, with a few adjacent subcentimeter nodules.    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  Low-dose chest CT on 07/24/2020 revealed Lung-RADS 2, benign appearance or behavior.   Continue to follow-up with Dr. Mortimer Fries.   6.   History of E. coli UTI and bacteremia  Patient completed a course of Septra then Keflex.  UA and culture this morning.  Continue to follow-up with Dr. Mortimer Fries.   7.   PCP prophylaxis  Patient has been on a prolonged course of steroids.  Review with nephrology feasibility of Septra DS 1 tab p.o. q. Monday Wednesday and Fridays given renal  insufficiency with full dose Septra. 8.   Begin Day 15 Rituxan. 9.   RTC in 1 week for labs (CBC with diff, BMP) and +/- steroid taper. 10.   RTC in 2 weeks for MD assessment and labs (CBC  with diff, CMP).  I discussed the assessment and treatment plan with the patient.  The patient was provided an opportunity to ask questions and all were answered.  The patient agreed with the plan and demonstrated an understanding of the instructions.  The patient was advised to call back or seek an in person evaluation if the symptoms worsen or if the condition fails to improve as anticipated.   Nolon Stalls, MD, PhD  07/29/2020, 8:38 AM  I, Mirian Mo Tufford, am acting as Education administrator for [employer]. Mike Gip, MD, PhD.  I, Michaiah Holsopple C. Mike Gip, MD, have reviewed the above documentation for accuracy and completeness, and I agree with the above.

## 2020-07-29 ENCOUNTER — Other Ambulatory Visit: Payer: Self-pay | Admitting: Hematology and Oncology

## 2020-07-29 ENCOUNTER — Other Ambulatory Visit: Payer: Self-pay

## 2020-07-29 ENCOUNTER — Telehealth: Payer: Self-pay

## 2020-07-29 ENCOUNTER — Inpatient Hospital Stay: Payer: BC Managed Care – PPO

## 2020-07-29 ENCOUNTER — Inpatient Hospital Stay (HOSPITAL_BASED_OUTPATIENT_CLINIC_OR_DEPARTMENT_OTHER): Payer: BC Managed Care – PPO | Admitting: Hematology and Oncology

## 2020-07-29 ENCOUNTER — Encounter: Payer: Self-pay | Admitting: Hematology and Oncology

## 2020-07-29 VITALS — BP 116/72 | HR 86 | Temp 98.2°F | Wt 174.6 lb

## 2020-07-29 DIAGNOSIS — Z87440 Personal history of urinary (tract) infections: Secondary | ICD-10-CM

## 2020-07-29 DIAGNOSIS — E538 Deficiency of other specified B group vitamins: Secondary | ICD-10-CM

## 2020-07-29 DIAGNOSIS — R768 Other specified abnormal immunological findings in serum: Secondary | ICD-10-CM

## 2020-07-29 DIAGNOSIS — R911 Solitary pulmonary nodule: Secondary | ICD-10-CM

## 2020-07-29 DIAGNOSIS — D591 Autoimmune hemolytic anemia, unspecified: Secondary | ICD-10-CM

## 2020-07-29 DIAGNOSIS — B1910 Unspecified viral hepatitis B without hepatic coma: Secondary | ICD-10-CM | POA: Diagnosis not present

## 2020-07-29 DIAGNOSIS — Z5112 Encounter for antineoplastic immunotherapy: Secondary | ICD-10-CM

## 2020-07-29 LAB — COMPREHENSIVE METABOLIC PANEL
ALT: 25 U/L (ref 0–44)
AST: 14 U/L — ABNORMAL LOW (ref 15–41)
Albumin: 3.9 g/dL (ref 3.5–5.0)
Alkaline Phosphatase: 48 U/L (ref 38–126)
Anion gap: 8 (ref 5–15)
BUN: 38 mg/dL — ABNORMAL HIGH (ref 8–23)
CO2: 26 mmol/L (ref 22–32)
Calcium: 9.2 mg/dL (ref 8.9–10.3)
Chloride: 105 mmol/L (ref 98–111)
Creatinine, Ser: 1.17 mg/dL — ABNORMAL HIGH (ref 0.44–1.00)
GFR, Estimated: 53 mL/min — ABNORMAL LOW (ref >60)
Glucose, Bld: 93 mg/dL (ref 70–99)
Potassium: 3.8 mmol/L (ref 3.5–5.1)
Sodium: 139 mmol/L (ref 135–145)
Total Bilirubin: 0.9 mg/dL (ref 0.3–1.2)
Total Protein: 6.7 g/dL (ref 6.5–8.1)

## 2020-07-29 LAB — URINALYSIS, COMPLETE (UACMP) WITH MICROSCOPIC
Bacteria, UA: NONE SEEN
Bilirubin Urine: NEGATIVE
Glucose, UA: NEGATIVE mg/dL
Hgb urine dipstick: NEGATIVE
Ketones, ur: NEGATIVE mg/dL
Leukocytes,Ua: NEGATIVE
Nitrite: NEGATIVE
Protein, ur: NEGATIVE mg/dL
Specific Gravity, Urine: 1.025 (ref 1.005–1.030)
WBC, UA: NONE SEEN WBC/hpf (ref 0–5)
pH: 5 (ref 5.0–8.0)

## 2020-07-29 LAB — CBC WITH DIFFERENTIAL/PLATELET
Abs Immature Granulocytes: 0.17 10*3/uL — ABNORMAL HIGH (ref 0.00–0.07)
Basophils Absolute: 0 10*3/uL (ref 0.0–0.1)
Basophils Relative: 0 %
Eosinophils Absolute: 0 10*3/uL (ref 0.0–0.5)
Eosinophils Relative: 0 %
HCT: 36.5 % (ref 36.0–46.0)
Hemoglobin: 11.8 g/dL — ABNORMAL LOW (ref 12.0–15.0)
Immature Granulocytes: 2 %
Lymphocytes Relative: 14 %
Lymphs Abs: 1.2 10*3/uL (ref 0.7–4.0)
MCH: 31.8 pg (ref 26.0–34.0)
MCHC: 32.3 g/dL (ref 30.0–36.0)
MCV: 98.4 fL (ref 80.0–100.0)
Monocytes Absolute: 0.5 10*3/uL (ref 0.1–1.0)
Monocytes Relative: 5 %
Neutro Abs: 6.7 10*3/uL (ref 1.7–7.7)
Neutrophils Relative %: 79 %
Platelets: 126 10*3/uL — ABNORMAL LOW (ref 150–400)
RBC: 3.71 MIL/uL — ABNORMAL LOW (ref 3.87–5.11)
RDW: 14.5 % (ref 11.5–15.5)
WBC: 8.5 10*3/uL (ref 4.0–10.5)
nRBC: 0 % (ref 0.0–0.2)

## 2020-07-29 LAB — FOLATE: Folate: 20.7 ng/mL (ref >5.9)

## 2020-07-29 LAB — LACTATE DEHYDROGENASE: LDH: 262 U/L — ABNORMAL HIGH (ref 98–192)

## 2020-07-29 MED ORDER — SODIUM CHLORIDE 0.9 % IV SOLN
Freq: Once | INTRAVENOUS | Status: AC
  Filled 2020-07-29: qty 250

## 2020-07-29 MED ORDER — METHYLPREDNISOLONE SODIUM SUCC 125 MG IJ SOLR
INTRAMUSCULAR | Status: AC
  Filled 2020-07-29: qty 2

## 2020-07-29 MED ORDER — ACETAMINOPHEN 325 MG PO TABS
650.0000 mg | ORAL_TABLET | Freq: Once | ORAL | Status: AC
  Administered 2020-07-29: 650 mg via ORAL

## 2020-07-29 MED ORDER — METHYLPREDNISOLONE SODIUM SUCC 125 MG IJ SOLR
80.0000 mg | Freq: Once | INTRAMUSCULAR | Status: AC
  Administered 2020-07-29: 80 mg via INTRAVENOUS

## 2020-07-29 MED ORDER — DIPHENHYDRAMINE HCL 50 MG/ML IJ SOLN
50.0000 mg | Freq: Once | INTRAMUSCULAR | Status: AC
  Administered 2020-07-29: 50 mg via INTRAVENOUS

## 2020-07-29 MED ORDER — ACETAMINOPHEN 325 MG PO TABS
ORAL_TABLET | ORAL | Status: AC
  Filled 2020-07-29: qty 2

## 2020-07-29 MED ORDER — DIPHENHYDRAMINE HCL 50 MG/ML IJ SOLN
INTRAMUSCULAR | Status: AC
  Filled 2020-07-29: qty 1

## 2020-07-29 MED ORDER — SODIUM CHLORIDE 0.9 % IV SOLN
1000.0000 mg | Freq: Once | INTRAVENOUS | Status: AC
  Administered 2020-07-29: 1000 mg via INTRAVENOUS
  Filled 2020-07-29: qty 100

## 2020-07-29 NOTE — Progress Notes (Signed)
Pt received prescribed treatment in clinic, pt stable at d/c. 

## 2020-07-29 NOTE — Progress Notes (Signed)
Patient here for follow up. She reports that she was recently hospitlized for UTI. VSS stable and WNL.

## 2020-07-30 LAB — URINE CULTURE: Culture: NO GROWTH

## 2020-07-31 ENCOUNTER — Encounter: Payer: Self-pay | Admitting: *Deleted

## 2020-07-31 ENCOUNTER — Other Ambulatory Visit: Payer: Self-pay

## 2020-07-31 DIAGNOSIS — D696 Thrombocytopenia, unspecified: Secondary | ICD-10-CM

## 2020-07-31 DIAGNOSIS — D591 Autoimmune hemolytic anemia, unspecified: Secondary | ICD-10-CM

## 2020-07-31 DIAGNOSIS — E538 Deficiency of other specified B group vitamins: Secondary | ICD-10-CM

## 2020-08-05 ENCOUNTER — Other Ambulatory Visit: Payer: Self-pay

## 2020-08-05 ENCOUNTER — Inpatient Hospital Stay (HOSPITAL_BASED_OUTPATIENT_CLINIC_OR_DEPARTMENT_OTHER): Payer: BC Managed Care – PPO | Admitting: Hematology and Oncology

## 2020-08-05 ENCOUNTER — Telehealth: Payer: Self-pay

## 2020-08-05 ENCOUNTER — Encounter: Payer: Self-pay | Admitting: Hematology and Oncology

## 2020-08-05 ENCOUNTER — Inpatient Hospital Stay: Payer: BC Managed Care – PPO | Attending: Hematology and Oncology

## 2020-08-05 VITALS — BP 145/72 | HR 73 | Temp 97.2°F | Resp 18 | Wt 175.8 lb

## 2020-08-05 DIAGNOSIS — R918 Other nonspecific abnormal finding of lung field: Secondary | ICD-10-CM | POA: Insufficient documentation

## 2020-08-05 DIAGNOSIS — D5911 Warm autoimmune hemolytic anemia: Secondary | ICD-10-CM | POA: Insufficient documentation

## 2020-08-05 DIAGNOSIS — A0472 Enterocolitis due to Clostridium difficile, not specified as recurrent: Secondary | ICD-10-CM | POA: Insufficient documentation

## 2020-08-05 DIAGNOSIS — Z833 Family history of diabetes mellitus: Secondary | ICD-10-CM | POA: Insufficient documentation

## 2020-08-05 DIAGNOSIS — Z8249 Family history of ischemic heart disease and other diseases of the circulatory system: Secondary | ICD-10-CM | POA: Diagnosis not present

## 2020-08-05 DIAGNOSIS — Z87891 Personal history of nicotine dependence: Secondary | ICD-10-CM | POA: Insufficient documentation

## 2020-08-05 DIAGNOSIS — D591 Autoimmune hemolytic anemia, unspecified: Secondary | ICD-10-CM | POA: Diagnosis not present

## 2020-08-05 DIAGNOSIS — B181 Chronic viral hepatitis B without delta-agent: Secondary | ICD-10-CM | POA: Diagnosis not present

## 2020-08-05 DIAGNOSIS — E538 Deficiency of other specified B group vitamins: Secondary | ICD-10-CM | POA: Diagnosis not present

## 2020-08-05 DIAGNOSIS — B1910 Unspecified viral hepatitis B without hepatic coma: Secondary | ICD-10-CM | POA: Diagnosis present

## 2020-08-05 DIAGNOSIS — Z7902 Long term (current) use of antithrombotics/antiplatelets: Secondary | ICD-10-CM | POA: Diagnosis not present

## 2020-08-05 DIAGNOSIS — E242 Drug-induced Cushing's syndrome: Secondary | ICD-10-CM

## 2020-08-05 DIAGNOSIS — R21 Rash and other nonspecific skin eruption: Secondary | ICD-10-CM

## 2020-08-05 DIAGNOSIS — R768 Other specified abnormal immunological findings in serum: Secondary | ICD-10-CM

## 2020-08-05 DIAGNOSIS — Z79899 Other long term (current) drug therapy: Secondary | ICD-10-CM | POA: Insufficient documentation

## 2020-08-05 DIAGNOSIS — R911 Solitary pulmonary nodule: Secondary | ICD-10-CM

## 2020-08-05 DIAGNOSIS — M329 Systemic lupus erythematosus, unspecified: Secondary | ICD-10-CM

## 2020-08-05 DIAGNOSIS — IMO0002 Reserved for concepts with insufficient information to code with codable children: Secondary | ICD-10-CM

## 2020-08-05 LAB — BASIC METABOLIC PANEL
Anion gap: 8 (ref 5–15)
BUN: 34 mg/dL — ABNORMAL HIGH (ref 8–23)
CO2: 30 mmol/L (ref 22–32)
Calcium: 9 mg/dL (ref 8.9–10.3)
Chloride: 100 mmol/L (ref 98–111)
Creatinine, Ser: 1.13 mg/dL — ABNORMAL HIGH (ref 0.44–1.00)
GFR, Estimated: 55 mL/min — ABNORMAL LOW (ref >60)
Glucose, Bld: 80 mg/dL (ref 70–99)
Potassium: 3.8 mmol/L (ref 3.5–5.1)
Sodium: 138 mmol/L (ref 135–145)

## 2020-08-05 LAB — CBC WITH DIFFERENTIAL/PLATELET
Abs Immature Granulocytes: 0.17 10*3/uL — ABNORMAL HIGH (ref 0.00–0.07)
Basophils Absolute: 0 10*3/uL (ref 0.0–0.1)
Basophils Relative: 0 %
Eosinophils Absolute: 0 10*3/uL (ref 0.0–0.5)
Eosinophils Relative: 1 %
HCT: 35.5 % — ABNORMAL LOW (ref 36.0–46.0)
Hemoglobin: 11.6 g/dL — ABNORMAL LOW (ref 12.0–15.0)
Immature Granulocytes: 2 %
Lymphocytes Relative: 20 %
Lymphs Abs: 1.4 10*3/uL (ref 0.7–4.0)
MCH: 31.7 pg (ref 26.0–34.0)
MCHC: 32.7 g/dL (ref 30.0–36.0)
MCV: 97 fL (ref 80.0–100.0)
Monocytes Absolute: 0.5 10*3/uL (ref 0.1–1.0)
Monocytes Relative: 7 %
Neutro Abs: 5 10*3/uL (ref 1.7–7.7)
Neutrophils Relative %: 70 %
Platelets: 120 10*3/uL — ABNORMAL LOW (ref 150–400)
RBC: 3.66 MIL/uL — ABNORMAL LOW (ref 3.87–5.11)
RDW: 15.3 % (ref 11.5–15.5)
WBC: 7.2 10*3/uL (ref 4.0–10.5)
nRBC: 0 % (ref 0.0–0.2)

## 2020-08-05 LAB — HEPATIC FUNCTION PANEL
ALT: 26 U/L (ref 0–44)
AST: 17 U/L (ref 15–41)
Albumin: 3.9 g/dL (ref 3.5–5.0)
Alkaline Phosphatase: 41 U/L (ref 38–126)
Bilirubin, Direct: <0.1 mg/dL (ref 0.0–0.2)
Total Bilirubin: 0.9 mg/dL (ref 0.3–1.2)
Total Protein: 6.4 g/dL — ABNORMAL LOW (ref 6.5–8.1)

## 2020-08-05 NOTE — Progress Notes (Signed)
[facility]  [address], [address] [place], [place] [address] Phone: [phone]  Fax: [phone]   Clinic Day:  08/05/2020  Referring physician: Frazier Richards, MD  Chief Complaint: Tina Page is a 64 y.o. female with recurrent warm autoimmune hemolytic anemia and renal insufficiency who is seen for a sick visit.  HPI: The patient was last seen in the hematology clinic on 07/29/2020. At that time, she felt "ok."  She noted fatigued 1/2 way through the day.  She had a runny nose.  She finished Keflex 3 days prior to complete an antibiotic course for E coli bacteremia.  She denied any fever.  She was taking entecavir every other day and prednisone 20 mg/day. She was holding folic acid.  Hematocrit was 36.5, hemoglobin 11.8, MCV 98.4, platelets 126,000, WBC 8,500.  Creatinine was 1.17 (CrCl 53 ml/min).  Urine culture and urinalysis were normal. Folate was 20.7. LDH was 262.  She received day 15 Rituxan. She was called and instructed to restart folic acid 3x per week and increase entecavir to once daily.  During the interim, she has been "ok".  She has red splotches on her face that appeared about a week ago. She also developed neck swelling, which is worse on the right side. These symptoms began after she received Rituxan. She experienced the splotches after her first dose of Rituxan, but not the swelling. She has not been in the sun lately and has not used any new skin products. She denies itching.  The patient has a headache at the back of her head but thinks it is due to her neck swelling. She has lupus but has never seen a rheumatologist.  She has IBS. She reports increased diarrhea; she has 4 bowel movements per day. She would like to send off a stool study.  She denies fevers, sweats, vision changes, runny nose, cough, sore throat, shortness of breath, chest pain, and orthopnea.  She takes prednisone 20 mg daily. She restarted folate 3x per week. She  takes entecavir once daily. She is not taking potassium.    Past Medical History:  Diagnosis Date  . Anemia   . Atherosclerosis of native arteries of extremity with intermittent claudication ([place]) 07/20/2016  . COPD (chronic obstructive pulmonary disease) ([place])   . 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 Elevated liver function tests 11/03/2017  . GERD (gastroesophageal reflux disease)   . Heme positive stool 01/29/2015  . Hepatitis C 07/12/2016  . HLD (hyperlipidemia)   . Hypertension   . Hypothyroidism   . 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 Thrombocytopenia ([place]) 10/04/2016    Past Surgical History:  Procedure Laterality Date  . ELECTROMAGNETIC NAVIGATION BROCHOSCOPY N/A 12/07/2017   Procedure: ELECTROMAGNETIC NAVIGATION BRONCHOSCOPY;  Surgeon: Flora Lipps, MD;  Location: [facility] ORS;  Service: Cardiopulmonary;  Laterality: N/A;  . ESOPHAGOGASTRODUODENOSCOPY (EGD) WITH PROPOFOL N/A 07/08/2016   Procedure: ESOPHAGOGASTRODUODENOSCOPY (EGD) WITH PROPOFOL;  Surgeon: Manya Silvas, MD;  Location: [facility] ENDOSCOPY;  Service: Endoscopy;  Laterality: N/A;  . KNEE SURGERY Right    repair of acl tear  . PERIPHERAL VASCULAR CATHETERIZATION N/A 07/10/2016   Procedure: Lower Extremity Angiography;  Surgeon: Katha Cabal, MD;  Location: [place] CV LAB;  Service: Cardiovascular;  Laterality: N/A;  . PERIPHERAL VASCULAR CATHETERIZATION N/A 07/10/2016   Procedure: Abdominal Aortogram w/Lower Extremity;  Surgeon: Katha Cabal, MD;  Location: [place] CV LAB;  Service: Cardiovascular;  Laterality:  N/A;  . PERIPHERAL VASCULAR CATHETERIZATION  07/10/2016   Procedure: Lower Extremity Intervention;  Surgeon: Katha Cabal, MD;  Location: [place] CV LAB;  Service: Cardiovascular;;    Family History  Problem Relation Age of Onset  . Diabetes Mother   . Hypertension Mother   . Diabetes Maternal Grandfather   . Hypertension Maternal  Grandfather   . Breast cancer Neg Hx     Social History:  reports that she quit smoking about 4 years ago. Her smoking use included cigarettes. She has a 58.75 pack-year smoking history. She has never used smokeless tobacco. She reports that she does not drink alcohol and does not use drugs. She has a 21 pack year smoking history (1/2 pack/day from age [age]). The patient works at [employer] She is exposed to cold temperatures. She works 3rd shift. She has a daughter, [name] and a daughter named [name]. She lives in [place]. The patient is accompanied by a family member today.  Allergies:  Allergies  Allergen Reactions  . Ciprofloxacin Swelling    Facial swelling following single oral dose.   . Codeine Anaphylaxis  . Nsaids Other (See Comments)    PT HAS HEPC    Current Medications: Current Outpatient Medications  Medication Sig Dispense Refill  . albuterol (PROVENTIL HFA;VENTOLIN HFA) 108 (90 Base) MCG/ACT inhaler Inhale 2 puffs into the lungs every 6 (six) hours as needed for wheezing or shortness of breath.     . allopurinol (ZYLOPRIM) 100 MG tablet TAKE 1 TABLET BY MOUTH EVERY DAY (Patient taking differently: Take 100 mg by mouth daily. ) 30 tablet 5  . clopidogrel (PLAVIX) 75 MG tablet Take 1 tablet (75 mg total) by mouth daily. 30 tablet 5  . cyanocobalamin 500 MCG tablet Take 500 mcg by mouth daily.    . diclofenac Sodium (VOLTAREN) 1 % GEL Apply 2 g topically 4 (four) times daily. 100 g 2  . entecavir (BARACLUDE) 0.5 MG tablet Take by mouth.    . folic acid (FOLVITE) 1 MG tablet TAKE 1 TABLET (1 MG TOTAL) BY MOUTH DAILY. 90 tablet 1  . hydrochlorothiazide (HYDRODIURIL) 25 MG tablet Take 25 mg by mouth daily.    [name] Kitchen levothyroxine (SYNTHROID, LEVOTHROID) 75 MCG tablet Take 75 mcg by mouth daily before breakfast.     . lisinopril (PRINIVIL,ZESTRIL) 20 MG tablet Take 20 mg by mouth daily.     . mupirocin ointment (BACTROBAN) 2 % Apply topically 2 (two) times  daily. 22 g 0  . omeprazole (PRILOSEC) 20 MG capsule Take 1 capsule (20 mg total) by mouth daily. 60 capsule 0  . pravastatin (PRAVACHOL) 40 MG tablet Take 40 mg by mouth every evening.    . predniSONE (DELTASONE) 20 MG tablet Take 2 tablets (40 mg total) by mouth daily with breakfast. (Patient taking differently: Take 20 mg by mouth daily with breakfast. ) 30 tablet 0  . Tiotropium Bromide-Olodaterol (STIOLTO RESPIMAT) 2.5-2.5 MCG/ACT AERS Inhale 2 puffs into the lungs daily.    . potassium chloride (KLOR-CON) 20 MEQ packet Take 20 mEq by mouth daily. (Patient not taking: Reported on 07/29/2020)    . Tiotropium Bromide Monohydrate (SPIRIVA RESPIMAT) 2.5 MCG/ACT AERS Inhale 2 puffs into the lungs daily for 1 day. 1 each 0   No current facility-administered medications for this visit.    Review of Systems  Constitutional: Negative for chills, diaphoresis, fever, malaise/fatigue and weight loss (up 1 lb).       Feels "okay."  HENT: Negative  for congestion, ear discharge, ear pain, hearing loss, nosebleeds, sinus pain, sore throat and tinnitus.        Runny nose.  Eyes: Negative for blurred vision.  Respiratory: Negative for cough, hemoptysis, sputum production and shortness of breath.   Cardiovascular: Negative for chest pain, palpitations and leg swelling.  Gastrointestinal: Positive for diarrhea (worsened, 4x per day). Negative for abdominal pain, blood in stool, constipation, heartburn, melena, nausea and vomiting.       IBS  Genitourinary: Negative for dysuria, frequency, hematuria and urgency.  Musculoskeletal: Negative for back pain, joint pain, myalgias and neck pain.       Neck swelling.  Skin: Positive for rash (splotches on face). Negative for itching.  Neurological: Positive for headaches (back of neck). Negative for dizziness, tingling, sensory change and weakness.  Endo/Heme/Allergies: Does not bruise/bleed easily.  Psychiatric/Behavioral: Negative for depression and memory  loss. The patient is not nervous/anxious and does not have insomnia.   All other systems reviewed and are negative.  Performance status (ECOG): 1  Vital Signs Blood pressure (!) 145/72, pulse 73, temperature (!) 97.2 F (36.2 C), temperature source Tympanic, resp. rate 18, weight 175 lb 13.1 oz (79.8 kg), SpO2 98 %.  Physical Exam Vitals and nursing note reviewed.  Constitutional:      General: She is not in acute distress.    Appearance: She is not diaphoretic.  HENT:     Head: Normocephalic and atraumatic.     Comments: Long gray hair with slight Cushingoid features.    Mouth/Throat:     Mouth: Mucous membranes are moist.     Pharynx: Oropharynx is clear. No oropharyngeal exudate or posterior oropharyngeal erythema.  Eyes:     General: No scleral icterus.    Extraocular Movements: Extraocular movements intact.     Conjunctiva/sclera: Conjunctivae normal.     Pupils: Pupils are equal, round, and reactive to light.     Comments: Dark rimmed glasses.  Cardiovascular:     Rate and Rhythm: Normal rate and regular rhythm.     Heart sounds: Normal heart sounds. No murmur heard.   Pulmonary:     Effort: Pulmonary effort is normal. No respiratory distress.     Breath sounds: Normal breath sounds. No wheezing or rales.  Chest:     Chest wall: No tenderness.  Breasts:     Right: No axillary adenopathy or supraclavicular adenopathy.     Left: No axillary adenopathy or supraclavicular adenopathy.    Abdominal:     General: Bowel sounds are normal. There is no distension.     Palpations: Abdomen is soft. There is no hepatomegaly, splenomegaly or mass.     Tenderness: There is no abdominal tenderness. There is no guarding or rebound.  Musculoskeletal:        General: Swelling (subcutaneous neck edema secondary to steroids) present. No tenderness. Normal range of motion.     Cervical back: Normal range of motion and neck supple.  Lymphadenopathy:     Head:     Right side of head:  No preauricular, posterior auricular or occipital adenopathy.     Left side of head: No preauricular, posterior auricular or occipital adenopathy.     Cervical: No cervical adenopathy.     Upper Body:     Right upper body: No supraclavicular or axillary adenopathy.     Left upper body: No supraclavicular or axillary adenopathy.     Lower Body: No right inguinal adenopathy. No left inguinal adenopathy.  Skin:  General: Skin is warm and dry.     Coloration: Skin is not jaundiced.     Comments: Red splotches on face (see photo).  Neurological:     Mental Status: She is alert and oriented to person, place, and time.  Psychiatric:        Behavior: Behavior normal.        Thought Content: Thought content normal.        Judgment: Judgment normal.      08/05/2020    Imaging studies: 07/07/2016:  Chest, abdomen, and pelvis CTangiogram revealed moderate diffuse atherosclerotic vascular disease of the abdominal aorta with severe stenosis of the left common iliac artery with suspected short segment occlusion. There was no adenopathy. Spleen was normal.  04/15/2017:  Abdominal ultrasoundrevealed a normal spleen and sludge in the gallbladder. The liver was echogenic consistent fatty infiltration and/or hepatocellular disease. 11/10/2017:  Abdomen and pelvic CTrevealed a 2.2 cm spiculated density in right middle lobe concerning for malignancy.  11/16/2017:  Chest CTon 11/16/2017 revealed a 2.2 x 2.0 x 2.4 spiculated RIGHT middle lobe mass. There was tiny nonspecific upper lobe pulmonary nodules greater on RIGHT, largest 3 mm, of uncertain etiology. There was additional 8 x 8 mm opacity in the posterior sulcus of the RIGHT lower lobe. 11/22/2017:  PET scanrevealed a 2 cm hypermetabolic right middle lobe lung mass (SUV 3.4) consistent with primary lung neoplasm. There were no findings for mediastinal/hilar lymphadenopathy or metastatic disease. There were areas of hypermetabolism  involving the left oblique abdominal muscles and the anorectal junction. 02/06/2018:  Chest CT revealed the spiculated 2.2 cm right middle lobe lesion was almost completely resolved with only a residual 4 mm irregular nodule identified at this location on a background of architectural distortion/evolving scar. There was a 5 mm right parahilar nodule stable since 11/16/2017(not present on 07/07/2016). There were tiny nodules in both upper lobes suggesting inhalation etiology. 06/27/2019:  Chest CT revealed the nodule of the right middle lobe had almost completely resolved, with residual linear scarring. There was persistent paramedian consolidation of the medial right middle lobe and lingula, generally in keeping with atypical infection, particularly atypical mycobacterium. There had been interval increase in size of a small nodule of the right middle lobe, now measuring 4 mm (previously 2 mm).   There was new 6 mm ground-glass opacity of the lateral left upper lobe measuring.  Although nonspecific, these were likely infectious or inflammatory in nature.  Attention on follow-up was receommended. There was unchanged tiny biapical pulmonary nodules, benign sequelae of nonspecific infection or inflammation, possibly related cigarette smoking, atypical infection, or other inhalational, inflammatory lung disease. There was hepatic steatosis. 12/26/2019:  Chest CT on 12/26/2019 revealed stable sub-cm bilateral pulmonary nodules, consistent with benign etiology. There was stable paramedian consolidation in the right middle lobe and lingula. There was a new airspace disease in inferior right middle lobe, with a few adjacent subcentimeter nodules. Overall, above findings were consistent with waxing and waning atypical infectious process, such as MAI.   07/24/2020:  Low dose chest CT revealed Lung-RADS 2, benign appearance or behavior. There was emphysema, aortic atherosclerosis, and coronary artery  calcifications.   Appointment on 08/05/2020  Component Date Value Ref Range Status  . Sodium 08/05/2020 138  135 - 145 mmol/L Final  . Potassium 08/05/2020 3.8  3.5 - 5.1 mmol/L Final  . Chloride 08/05/2020 100  98 - 111 mmol/L Final  . CO2 08/05/2020 30  22 - 32 mmol/L Final  . Glucose, Bld  08/05/2020 80  70 - 99 mg/dL Final   Glucose reference range applies only to samples taken after fasting for at least 8 hours.  . BUN 08/05/2020 34* 8 - 23 mg/dL Final  . Creatinine, Ser 08/05/2020 1.13* 0.44 - 1.00 mg/dL Final  . Calcium 08/05/2020 9.0  8.9 - 10.3 mg/dL Final  . GFR, Estimated 08/05/2020 55* >60 mL/min Final   Comment: (NOTE) Calculated using the CKD-EPI Creatinine Equation (2021)   . Anion gap 08/05/2020 8  5 - 15 Final   Performed at [facility], [address] [place], [place] [address]  . WBC 08/05/2020 7.2  4.0 - 10.5 K/uL Final  . RBC 08/05/2020 3.66* 3.87 - 5.11 MIL/uL Final  . Hemoglobin 08/05/2020 11.6* 12.0 - 15.0 g/dL Final  . HCT 08/05/2020 35.5* 36 - 46 % Final  . MCV 08/05/2020 97.0  80.0 - 100.0 fL Final  . MCH 08/05/2020 31.7  26.0 - 34.0 pg Final  . MCHC 08/05/2020 32.7  30.0 - 36.0 g/dL Final  . RDW 08/05/2020 15.3  11.5 - 15.5 % Final  . Platelets 08/05/2020 120* 150 - 400 K/uL Final  . nRBC 08/05/2020 0.0  0.0 - 0.2 % Final  . Neutrophils Relative % 08/05/2020 70  % Final  . Neutro Abs 08/05/2020 5.0  1.7 - 7.7 K/uL Final  . Lymphocytes Relative 08/05/2020 20  % Final  . Lymphs Abs 08/05/2020 1.4  0.7 - 4.0 K/uL Final  . Monocytes Relative 08/05/2020 7  % Final  . Monocytes Absolute 08/05/2020 0.5  0.1 - 1.0 K/uL Final  . Eosinophils Relative 08/05/2020 1  % Final  . Eosinophils Absolute 08/05/2020 0.0  0.0 - 0.5 K/uL Final  . Basophils Relative 08/05/2020 0  % Final  . Basophils Absolute 08/05/2020 0.0  0.0 - 0.1 K/uL Final  . Immature Granulocytes 08/05/2020 2  % Final  . 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 One week prior to presentation, she felt like she was coming down with something. She denied any fever, runny nose, sore throat or cough. She had some diarrhea. She denied any new medications or herbal products.  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 Reticulocyte count was 11.3%. Ferritin was 562. Iron studies revealed a saturation of 20% and a TIBC of 214 (low). B12 was 231 (low normal). Folate was 42. Peripheral smear revealed rouleaux formation. Labs on 06/21/2019revealed normal flow cytometry, SPEP, and immunoglobulins.  Additional testingincluded the following+ studies: hepatitis C antibody,hepatitis B core antibody,CMV IgM, and EBV VCA (IgM and IgG). Hepatitis B by PCRwas negative. Hepatitis C RNAwas negative. Mycoplasma pneumonia IgM was negative. Reticulocyte count was 11.3% (high) indicating appropriate marrow response. C3 and C4 were normal. Cold agglutinin titer was negative x 2. Negative studies included: ANA, hepatitis B surface antigen, SPEP, and free light chain ratio. 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 MMA was initially 330 (normal). Repeat MMA was elevated on 08/01/2016 confirming B12 deficiency. Hepatitis B surface antibody was positive and hepatitis B surface antigen was negative on 08/06/2016.  Chest, abdomen, and pelvis CTangiogram on 07/07/2016 revealed moderate diffuse atherosclerotic vascular disease of the abdominal aorta with severe stenosis of the left common iliac  artery with suspected short segment occlusion. There was no adenopathy. Spleen was normal. Abdominal ultrasoundon 04/15/2017 revealed a normal spleen and sludge in the gallbladder.  The liver was echogenic consistent fatty infiltration and/or hepatocellular disease.  She underwent PTCA and stent placementin right and left common iliac arteries and left external iliac artery on 07/10/2016.She is on Plavix.EGD on 03/19/2015 revealed gastritis in the body and antrum. Colonoscopyon 03/19/2015 revealed one hyperplastic polyp. EGDon 07/08/2016 was normal. No evidence of bleeding.  She has received6 units of warmed PRBCsto date (last on 08/01/2016).If Rituxan is needed, she requires entecavir 0.5 mg q day beginning 2 weeks prior to Rituxan. In addition, hepatitis B viral level and LFTs should be checked monthly.  She began steroids(1 mg/kg) on 08/01/2016. She stopped prednisoneon 03/12/2017. She is onfolic acid1 mg a day.   She hasB12 deficiency. B12 was 231 on 07/09/2016. She is on oral B12. B12 and folate were normal on 03/09/2017 and 10/12/2017.  She developed peri-oral and intranasalherpes simplex-1. She was treated with valacyclovir and doxycycline on 10/19/2016. She developed a transient elevated alkaline phosphataseon 10/19/2016 which subsequently normalized.  She was documented to have awarm autoantibodyon 10/12/2017. LDH and bilirubin were normal. Hematocrit improved. She began prednisone10 mg on 12/07/2017 (discontinued 03/2018).She received Rituxanweekly x 4 (last dose 06/16/2018).  Entecavir was discontinued on 07/06/2019.  She developedflu like symptomson 10/26/2017. Symptoms included cough, myalgias, and fever (tmax unknown). She was prescribed Mucinex, Tamiflu, and amoxicillin. She only took amoxicillin x 5 days. She developedincreased liver function testson 11/02/2017. CMV IgG was positive. EBV VCA IgG, NA IgG, early antigen antibody IgG were elevated. EBV VCA IgM was <36. Testing was c/w a convalescence/past infection or reactivated infection. LFTs normalized on 11/15/2017. Smooth muscle antibodywas 39 (high) on  11/10/2017 and 34 (high) on 11/30/2017.   Labs on 06/12/2020 revealed recurrent anemia.  Hematocrit was 27.6, hemoglobin 8.4, MCV 104.5, platelets 138,000, WBC 3,400 (ANC 2,300). Creatinine was 1.17 (CrCl 50 ml/min).  Normal labs included ferritin (434), iron studies, B12 (885), folate (> 100).  Coombs polyspecific AGH test was positive (warm autoantibody).  Reticulocyte count was 7.1%.  LDH was 204 (98-192).  Peripheral smear revealed macrocytic anemia with mild thrombocytopenia. The morphology of RBCs, WBCs, and platelets were within normal limits. There was no evidence of circulating blasts or schistocytes.  She began prednisone 1 mg/kg (80 mg) on 06/16/2020.  Prednisone was decreased to 20 mg a day on 07/24/2020.  She received Rituxan 1000 mg on day 1 and 15 (07/15/2020 and 07/29/2020).  She began entecavir on 06/28/2020.   Chest CTon 11/16/2017 revealed a 2.2 x 2.0 x 2.4 spiculated RIGHT middle lobe mass. There was tiny nonspecific upper lobe pulmonary nodules greater on RIGHT, largest 3 mm, of uncertain etiology. There was additional 8 x 8 mm opacity in the posterior sulcus of the RIGHT lower lobe. PET scanon 11/22/2017 revealed a 2 cm hypermetabolic right middle lobe lung mass (SUV 3.4) consistent with primary lung neoplasm. There were no findings for mediastinal/hilar lymphadenopathy or metastatic disease. There were areas of hypermetabolism involving the left oblique abdominal muscles and the anorectal junction.  PFTs on 11/18/2017 revealed an FEV1 of 1.22 liters (51%). DLCO adj was 6.8 mL/mmHg/min (32%).  ENB done on 12/07/2017. Cytology was negative for malignancy. Pathologydemonstrated organizing pneumonia and chronic bronchitis. Pathologist commented that organizing pneumonia spanned about 2 mm in one fragment. Cases of focal organizing pneumonia with hypermetabolism on FDG-PET have been described, but changes of this type can also be adjacent  to a neoplasm. Patient prescribed a  daily dose of Prednisone 10 mg, with re-imaging planned in 6-8 weeks.   Low dose chest CT on 07/24/2020 revealed Lung-RADS 2, benign appearance or behavior. There was emphysema, aortic atherosclerosis, and coronary artery calcifications.   She has diarrhea. 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 She received azithromycin x 3 days. She received ciprofloxacin x 10 days without improvement. She has seen ID in [place]. She began Bactrim on 03/04/2018 (discontinued on 03/11/2018) secondary to increase in creatine.  She has renal insufficiency. Creatininehas been followed: 1.17 on 11/05/2017, 1.85 on 11/09/2017, 1.38 on 11/12/2017, 1.74 on 11/15/2017, 1.66 on 11/17/2017, and 1.48 on 11/23/2017. Urinalysis on 11/15/2017 revealed revealed no hemoglobin, bilirubin or active sediment.  Liver function tests increased on 02/15/2019.  Work-up on 02/16/2019 revealed hepatitis B E antibody positive.  Negative studies included: hepatitis A total antibody, hepatitis A IgM, hepatitis B surface antigen, hepatitis B E antigen.  Hepatitis B DNA was not detected.  Folate was 80.5 and vitamin B12 was 443.  She has received her vaccinations:  She has completed the PCV13, PPSV23, and HiB.  She received Menvio (quadrivalent meningoccal vaccine) and Bexero (univalent serogroup B meningoccal vaccine) on 07/06/2019.  She was admitted to [facility] from 07/16/2020 - 07/18/2020 with a fever and UTI.  Blood and urine culture grew E coli.  She received ceftriaxone and was discharged on Septra DS 1 tablet BID.  Septra was switched to Keflex secondary to renal insufficiency.  She notes that she tested positive for COVID-19 on 08/21/2019.  She received the Moderna COVID-19 vaccine on 11/10/2019 and 12/08/2019.   Symptomatically, she has mild facial edema secondary to steroids (Cushingoid).  She has a splotchy isolated facial rash.  Plan: 1.   Labs today: CBC with diff, CMP. 2.Warm  autoimmune hemolytic anemia Clinically, he is doing well  She active warm autoimmune hemolytic anemia responding well to Rituxan and a steroid taper.       Hematocrit35.4. Hemoglobin 11.4.LDH 174 (normal) on 12/28/2019. Hematocrit 27.6.  Hemoglobin   8.4.  LDH 204 (98-192) on 06/12/2020.   Work-up confirmed warm autoantibodies.  Hematocrit 30.4.  Hemoglobin   9.2.  LDH 164 (98-192) on 06/19/2020.   She began prednisone 1 mg/kg (80 mg) on 06/16/2020.  Hematocrit 35.1.  Hemoglobin 11.0.  MCV 106.0 on 06/28/2020.  Hematocrit 35.5.  Hemoglobin 11.6.  MCV   97.0 on 08/05/2020.  She is s/p day 1 and 15 Rituxan (last 07/29/2020.  She is on prednisone 20 mg a day.  Continue to monitor. 3.Hepatitis B Patient was previously exposed to hepatitis B. She was on entecavir x 1 year when she previously received Rituxan (stopped 07/06/2019). Patient did not complete her meningococcal vaccines.  Liver function test are normal.  She began entecavir 0.5 mg daily on 06/28/2020.   Dose adjusted based on renal function.   CrCl 30-49 ml/min   50% dosing (0.5 mg QOD).   CrCl >= 50 ml/min 100% dosing (0.5 mg a day).  Continue to monitor creatinine and adjust entecavir dose accordingly. 4.B12 deficiency B12 was 443 on 02/15/2019 and 885 on 06/12/2020.  Folate was 80.5 on 02/15/2019 and >100 on 06/12/2020.   Folate was 20.7 on 07/29/2020  She is currently on folic acid 1 mg 3 days a week (Mondays, Wednesdays and Fridays). 5. Right middle lobe nodule  Chest CT on 12/26/2019 revealed stable sub-cm bilateral pulmonary nodules c/w benign etiology.    There was a new airspace  disease in inferior right middle lobe, with a few adjacent subcentimeter nodules.    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  Low-dose chest CT on 07/24/2020 revealed Lung-RADS 2, benign appearance or behavior.   Follow-up with Dr. Mortimer Fries.   6.   Facial  rash  Etiology unclear.  Etiology is not felt secondary to Rituxan.  Follow-up with rheumatology (patient has SLE). 7.   PCP prophylaxis  Anticipate starting either Septra or dapsone this week for PCP prophylaxis.  Spoke with nephrology who agrees with a trial of Septra.   If creatinine increases, will begin dapsone daily. 8.   Rheumatology consult. 9.   RN:  Call patient at end of week (Thursday) re: rash. 10.   RTC in 1 week for MD assessment and labs (CBC with diff, BMP).  I discussed the assessment and treatment plan with the patient.  The patient was provided an opportunity to ask questions and all were answered.  The patient agreed with the plan and demonstrated an understanding of the instructions.  The patient was advised to call back or seek an in person evaluation if the symptoms worsen or if the condition fails to improve as anticipated.  I provided 14 minutes of face-to-face time during this this encounter and > 50% was spent counseling as documented under my assessment and plan.   Nolon Stalls, MD, PhD  08/05/2020, 10:29 AM  I, Mirian Mo Tufford, am acting as Education administrator for [employer]. Mike Gip, MD, PhD.  I, Rhian Asebedo C. Mike Gip, MD, have reviewed the above documentation for accuracy and completeness, and I agree with the above.

## 2020-08-05 NOTE — Patient Instructions (Signed)
  Decrease prednisone to 10 mg a day.

## 2020-08-05 NOTE — Progress Notes (Signed)
Patient states her face has been swollen for about a week. Mainly on right side. Patient also states that she has red spots over her face. Patient denies any itching.

## 2020-08-06 ENCOUNTER — Other Ambulatory Visit: Payer: Self-pay | Admitting: Hematology and Oncology

## 2020-08-06 DIAGNOSIS — D5912 Cold autoimmune hemolytic anemia: Secondary | ICD-10-CM

## 2020-08-06 DIAGNOSIS — M329 Systemic lupus erythematosus, unspecified: Secondary | ICD-10-CM

## 2020-08-07 MED ORDER — SULFAMETHOXAZOLE 800 MG-TRIMETHOPRIM 160 MG TABLET
ORAL | 0 days
Start: 2020-08-07 — End: ?

## 2020-08-07 MED ORDER — SULFAMETHOXAZOLE-TRIMETHOPRIM 800-160 MG PO TABS: 1.0000 | ORAL_TABLET | ORAL | 1 refills | Status: DC

## 2020-08-08 ENCOUNTER — Other Ambulatory Visit: Payer: Self-pay

## 2020-08-08 NOTE — Progress Notes (Signed)
Tina Page  [address], [address] [place], [place] [address] Phone: [phone]  Fax: [phone]   Clinic Day:  08/12/2020  Referring physician: Frazier Richards, MD  Chief Complaint: Tina Page is a 64 y.o. 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  HPI: The patient was last seen in the hematology clinic on 08/05/2020. At that time, she was seen for a sick call visit.  She notes splotches on her face and facial swelling. Hematocrit was 35.5, hemoglobin 11.6, MCV 97.0, platelets 120,000, WBC 7,200.  Creatinine was 1.13 (CrCl 55 ml/min). 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  She was to follow up with rheumatology. She was started on Septra for PCP prophylaxis.  During the interim, she has been "okay."  She still has some diarrhea and has not given a stool sample. She denies any changes in her urine. She thinks she has IBS and would like to see Dr. Tiffany Kocher if her stool studies are negative. She still has splotches on her face.  The patient is taking entecavir everyday and Septra on MWF. She is on prednisone 10 mg/day.  She takes folate every other day. She does not take potassium.  The patient is seeing Dr. Posey Pronto in rheumatology in 08/2020.   Past Medical History:  Diagnosis Date  . Anemia   . Atherosclerosis of native arteries of extremity with intermittent claudication ([place]) 07/20/2016  . COPD (chronic obstructive pulmonary disease) ([place])   . 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 Elevated liver function tests 11/03/2017  . GERD (gastroesophageal reflux disease)   . Heme positive stool 01/29/2015  . Hepatitis C 07/12/2016  . HLD (hyperlipidemia)   . Hypertension   . Hypothyroidism   . 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 Thrombocytopenia ([place]) 10/04/2016    Past Surgical History:  Procedure Laterality  Date  . ELECTROMAGNETIC NAVIGATION BROCHOSCOPY N/A 12/07/2017   Procedure: ELECTROMAGNETIC NAVIGATION BRONCHOSCOPY;  Surgeon: Flora Lipps, MD;  Location: [facility] ORS;  Service: Cardiopulmonary;  Laterality: N/A;  . ESOPHAGOGASTRODUODENOSCOPY (EGD) WITH PROPOFOL N/A 07/08/2016   Procedure: ESOPHAGOGASTRODUODENOSCOPY (EGD) WITH PROPOFOL;  Surgeon: Manya Silvas, MD;  Location: [facility] ENDOSCOPY;  Service: Endoscopy;  Laterality: N/A;  . KNEE SURGERY Right    repair of acl tear  . PERIPHERAL VASCULAR CATHETERIZATION N/A 07/10/2016   Procedure: Lower Extremity Angiography;  Surgeon: Katha Cabal, MD;  Location: [facility];  Service: Cardiovascular;  Laterality: N/A;  . PERIPHERAL VASCULAR CATHETERIZATION N/A 07/10/2016   Procedure: Abdominal Aortogram w/Lower Extremity;  Surgeon: Katha Cabal, MD;  Location: [facility];  Service: Cardiovascular;  Laterality: N/A;  . PERIPHERAL VASCULAR CATHETERIZATION  07/10/2016   Procedure: Lower Extremity Intervention;  Surgeon: Katha Cabal, MD;  Location: [facility];  Service: Cardiovascular;;    Family History  Problem Relation Age of Onset  . Diabetes Mother   . Hypertension Mother   . Diabetes Maternal Grandfather   . Hypertension Maternal Grandfather   . Breast cancer Neg Hx     Social History:  reports that she quit smoking about 4 years ago. Her smoking use included cigarettes. She has a 58.75 pack-year smoking history. She has never used smokeless tobacco. She reports that she does not drink alcohol and does not use drugs. She has a 21 pack year smoking history (1/2 pack/day from  age [age]). The patient works at [employer] She is exposed to cold temperatures. She works 3rd shift. She has a daughter, [name] and a daughter named [name]. She lives in [place]. The patient is alone today.  Allergies:  Allergies  Allergen Reactions  . Ciprofloxacin Swelling    Facial swelling following  single oral dose.   . Codeine Anaphylaxis  . Nsaids Other (See Comments)    PT HAS [facility]    Current Medications: Current Outpatient Medications  Medication Sig Dispense Refill  . albuterol (PROVENTIL HFA;VENTOLIN HFA) 108 (90 Base) MCG/ACT inhaler Inhale 2 puffs into the lungs every 6 (six) hours as needed for wheezing or shortness of breath.     . allopurinol (ZYLOPRIM) 100 MG tablet TAKE 1 TABLET BY MOUTH EVERY DAY (Patient taking differently: Take 100 mg by mouth daily.) 30 tablet 5  . clopidogrel (PLAVIX) 75 MG tablet Take 1 tablet (75 mg total) by mouth daily. 30 tablet 5  . cyanocobalamin 500 MCG tablet Take 500 mcg by mouth daily.    . diclofenac Sodium (VOLTAREN) 1 % GEL Apply 2 g topically 4 (four) times daily. 100 g 2  . entecavir (BARACLUDE) 0.5 MG tablet Take by mouth.    . folic acid (FOLVITE) 1 MG tablet TAKE 1 TABLET (1 MG TOTAL) BY MOUTH DAILY. 90 tablet 1  . hydrochlorothiazide (HYDRODIURIL) 25 MG tablet Take 25 mg by mouth daily.    [name] Kitchen levothyroxine (SYNTHROID, LEVOTHROID) 75 MCG tablet Take 75 mcg by mouth daily before breakfast.     . lisinopril (PRINIVIL,ZESTRIL) 20 MG tablet Take 20 mg by mouth daily.     [name] Kitchen omeprazole (PRILOSEC) 20 MG capsule Take 1 capsule (20 mg total) by mouth daily. 60 capsule 0  . pravastatin (PRAVACHOL) 40 MG tablet Take 40 mg by mouth every evening.    . predniSONE (DELTASONE) 20 MG tablet Take 2 tablets (40 mg total) by mouth daily with breakfast. (Patient taking differently: Take 10 mg by mouth daily with breakfast.) 30 tablet 0  . sulfamethoxazole-trimethoprim (BACTRIM DS) 800-160 MG tablet Take 1 tablet by mouth 3 (three) times a week. Take on Mondays, Wednesdays, Fridays 20 tablet 1  . Tiotropium Bromide Monohydrate (SPIRIVA RESPIMAT) 2.5 MCG/ACT AERS Inhale 2 puffs into the lungs daily for 1 day. 1 each 0  . Tiotropium Bromide-Olodaterol (STIOLTO RESPIMAT) 2.5-2.5 MCG/ACT AERS Inhale 2 puffs into the lungs daily.    . mupirocin ointment  (BACTROBAN) 2 % Apply topically 2 (two) times daily. (Patient not taking: Reported on 08/12/2020) 22 g 0  . potassium chloride (KLOR-CON) 20 MEQ packet Take 20 mEq by mouth daily. (Patient not taking: Reported on 08/12/2020)     No current facility-administered medications for this visit.    Review of Systems  Constitutional: Negative for chills, diaphoresis, fever, malaise/fatigue and weight loss (up 1 lb).       Feels "okay."  HENT: Negative for congestion, ear discharge, ear pain, hearing loss, nosebleeds, sinus pain, sore throat and tinnitus.   Eyes: Negative for blurred vision.  Respiratory: Negative for cough, hemoptysis, sputum production and shortness of breath.   Cardiovascular: Negative for chest pain, palpitations and leg swelling.  Gastrointestinal: Positive for diarrhea. Negative for abdominal pain, blood in stool, constipation, heartburn, melena, nausea and vomiting.  Genitourinary: Negative for dysuria, frequency, hematuria and urgency.  Musculoskeletal: Negative for back pain, joint pain, myalgias and neck pain.  Skin: Positive for rash (splotches on face). Negative for itching.  Neurological: Negative for dizziness,  tingling, sensory change, weakness and headaches.  Endo/Heme/Allergies: Does not bruise/bleed easily.  Psychiatric/Behavioral: Negative for depression and memory loss. The patient is not nervous/anxious and does not have insomnia.   All other systems reviewed and are negative.  Performance status (ECOG): 1  Vital Signs Blood pressure (!) 141/68, pulse 84, temperature (!) 95.2 F (35.1 C), temperature source Tympanic, resp. rate 16, weight 176 lb 9.4 oz (80.1 kg), SpO2 100 %.  Physical Exam Vitals and nursing note reviewed.  Constitutional:      General: She is not in acute distress.    Appearance: She is not diaphoretic.  HENT:     Head: Normocephalic and atraumatic.     Comments: Long gray hair.    Mouth/Throat:     Mouth: Mucous membranes are moist.      Pharynx: Oropharynx is clear. No oropharyngeal exudate or posterior oropharyngeal erythema.  Eyes:     General: No scleral icterus.    Extraocular Movements: Extraocular movements intact.     Conjunctiva/sclera: Conjunctivae normal.     Pupils: Pupils are equal, round, and reactive to light.     Comments: Dark rimmed glasses.  Cardiovascular:     Rate and Rhythm: Normal rate and regular rhythm.     Heart sounds: Normal heart sounds. No murmur heard.   Pulmonary:     Effort: Pulmonary effort is normal. No respiratory distress.     Breath sounds: Normal breath sounds. No wheezing or rales.  Chest:     Chest wall: No tenderness.  Breasts:     Right: No axillary adenopathy or supraclavicular adenopathy.     Left: No axillary adenopathy or supraclavicular adenopathy.    Abdominal:     General: Bowel sounds are normal. There is no distension.     Palpations: Abdomen is soft. There is no hepatomegaly, splenomegaly or mass.     Tenderness: There is no abdominal tenderness. There is no guarding or rebound.  Musculoskeletal:        General: No swelling or tenderness. Normal range of motion.     Cervical back: Normal range of motion and neck supple.  Lymphadenopathy:     Head:     Right side of head: No preauricular, posterior auricular or occipital adenopathy.     Left side of head: No preauricular, posterior auricular or occipital adenopathy.     Cervical: No cervical adenopathy.     Upper Body:     Right upper body: No supraclavicular or axillary adenopathy.     Left upper body: No supraclavicular or axillary adenopathy.     Lower Body: No right inguinal adenopathy. No left inguinal adenopathy.  Skin:    General: Skin is warm and dry.     Coloration: Skin is not jaundiced.     Comments: Red splotches on face, improved  Neurological:     Mental Status: She is alert and oriented to person, place, and time.  Psychiatric:        Behavior: Behavior normal.        Thought  Content: Thought content normal.        Judgment: Judgment normal.    08/05/2020    Imaging studies: 07/07/2016:  Chest, abdomen, and pelvis CTangiogram revealed moderate diffuse atherosclerotic vascular disease of the abdominal aorta with severe stenosis of the left common iliac artery with suspected short segment occlusion. There was no adenopathy. Spleen was normal.  04/15/2017:  Abdominal ultrasoundrevealed a normal spleen and sludge in the gallbladder. The liver was  echogenic consistent fatty infiltration and/or hepatocellular disease. 11/10/2017:  Abdomen and pelvic CTrevealed a 2.2 cm spiculated density in right middle lobe concerning for malignancy.  11/16/2017:  Chest CTon 11/16/2017 revealed a 2.2 x 2.0 x 2.4 spiculated RIGHT middle lobe mass. There was tiny nonspecific upper lobe pulmonary nodules greater on RIGHT, largest 3 mm, of uncertain etiology. There was additional 8 x 8 mm opacity in the posterior sulcus of the RIGHT lower lobe. 11/22/2017:  PET scanrevealed a 2 cm hypermetabolic right middle lobe lung mass (SUV 3.4) consistent with primary lung neoplasm. There were no findings for mediastinal/hilar lymphadenopathy or metastatic disease. There were areas of hypermetabolism involving the left oblique abdominal muscles and the anorectal junction. 02/06/2018:  Chest CT revealed the spiculated 2.2 cm right middle lobe lesion was almost completely resolved with only a residual 4 mm irregular nodule identified at this location on a background of architectural distortion/evolving scar. There was a 5 mm right parahilar nodule stable since 11/16/2017(not present on 07/07/2016). There were tiny nodules in both upper lobes suggesting inhalation etiology. 06/27/2019:  Chest CT revealed the nodule of the right middle lobe had almost completely resolved, with residual linear scarring. There was persistent paramedian consolidation of the medial right middle lobe and lingula,  generally in keeping with atypical infection, particularly atypical mycobacterium. There had been interval increase in size of a small nodule of the right middle lobe, now measuring 4 mm (previously 2 mm).   There was new 6 mm ground-glass opacity of the lateral left upper lobe measuring.  Although nonspecific, these were likely infectious or inflammatory in nature.  Attention on follow-up was receommended. There was unchanged tiny biapical pulmonary nodules, benign sequelae of nonspecific infection or inflammation, possibly related cigarette smoking, atypical infection, or other inhalational, inflammatory lung disease. There was hepatic steatosis. 12/26/2019:  Chest CT on 12/26/2019 revealed stable sub-cm bilateral pulmonary nodules, consistent with benign etiology. There was stable paramedian consolidation in the right middle lobe and lingula. There was a new airspace disease in inferior right middle lobe, with a few adjacent subcentimeter nodules. Overall, above findings were consistent with waxing and waning atypical infectious process, such as MAI.   07/24/2020:  Low dose chest CT revealed Lung-RADS 2, benign appearance or behavior. There was emphysema, aortic atherosclerosis, and coronary artery calcifications.   Appointment on 08/12/2020  Component Date Value Ref Range Status  . Sodium 08/12/2020 139  135 - 145 mmol/L Final  . Potassium 08/12/2020 3.9  3.5 - 5.1 mmol/L Final  . Chloride 08/12/2020 98  98 - 111 mmol/L Final  . CO2 08/12/2020 28  22 - 32 mmol/L Final  . Glucose, Bld 08/12/2020 77  70 - 99 mg/dL Final   Glucose reference range applies only to samples taken after fasting for at least 8 hours.  . BUN 08/12/2020 24* 8 - 23 mg/dL Final  . Creatinine, Ser 08/12/2020 1.15* 0.44 - 1.00 mg/dL Final  . Calcium 08/12/2020 8.8* 8.9 - 10.3 mg/dL Final  . Total Protein 08/12/2020 6.8  6.5 - 8.1 g/dL Final  . Albumin 08/12/2020 4.1  3.5 - 5.0 g/dL Final  . AST 08/12/2020 16  15 - 41 U/L  Final  . ALT 08/12/2020 22  0 - 44 U/L Final  . Alkaline Phosphatase 08/12/2020 52  38 - 126 U/L Final  . Total Bilirubin 08/12/2020 0.6  0.3 - 1.2 mg/dL Final  . GFR, Estimated 08/12/2020 54* >60 mL/min Final   Comment: (NOTE) Calculated using the CKD-EPI Creatinine  Equation (2021)   . Anion gap 08/12/2020 13  5 - 15 Final   Performed at [facility], [address] [place], [place] [address]  . WBC 08/12/2020 5.4  4.0 - 10.5 K/uL Final  . RBC 08/12/2020 3.44* 3.87 - 5.11 MIL/uL Final  . Hemoglobin 08/12/2020 10.9* 12.0 - 15.0 g/dL Final  . HCT 08/12/2020 33.7* 36.0 - 46.0 % Final  . MCV 08/12/2020 98.0  80.0 - 100.0 fL Final  . MCH 08/12/2020 31.7  26.0 - 34.0 pg Final  . MCHC 08/12/2020 32.3  30.0 - 36.0 g/dL Final  . RDW 08/12/2020 15.7* 11.5 - 15.5 % Final  . Platelets 08/12/2020 126* 150 - 400 K/uL Final  . nRBC 08/12/2020 0.0  0.0 - 0.2 % Final  . Neutrophils Relative % 08/12/2020 62  % Final  . Neutro Abs 08/12/2020 3.3  1.7 - 7.7 K/uL Final  . Lymphocytes Relative 08/12/2020 26  % Final  . Lymphs Abs 08/12/2020 1.4  0.7 - 4.0 K/uL Final  . Monocytes Relative 08/12/2020 8  % Final  . Monocytes Absolute 08/12/2020 0.5  0.1 - 1.0 K/uL Final  . Eosinophils Relative 08/12/2020 1  % Final  . Eosinophils Absolute 08/12/2020 0.1  0.0 - 0.5 K/uL Final  . Basophils Relative 08/12/2020 1  % Final  . Basophils Absolute 08/12/2020 0.0  0.0 - 0.1 K/uL Final  . Immature Granulocytes 08/12/2020 2  % Final  . Abs Immature Granulocytes 08/12/2020 0.12* 0.00 - 0.07 K/uL Final   Performed at [facility], [address], [place], [place] [address]    Assessment:  NISSA STANNARD is a 64 y.o. 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 One week prior to presentation, she felt like she was coming down with something. She denied any fever, runny nose, sore throat or cough. She had some diarrhea. She denied any new  medications or herbal products.  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 Reticulocyte count was 11.3%. Ferritin was 562. Iron studies revealed a saturation of 20% and a TIBC of 214 (low). B12 was 231 (low normal). Folate was 42. Peripheral smear revealed rouleaux formation. Labs on 06/21/2019revealed normal flow cytometry, SPEP, and immunoglobulins.  Additional testingincluded the following+ studies: hepatitis C antibody,hepatitis B core antibody,CMV IgM, and EBV VCA (IgM and IgG). Hepatitis B by PCRwas negative. Hepatitis C RNAwas negative. Mycoplasma pneumonia IgM was negative. Reticulocyte count was 11.3% (high) indicating appropriate marrow response. C3 and C4 were normal. Cold agglutinin titer was negative x 2. Negative studies included: ANA, hepatitis B surface antigen, SPEP, and free light chain ratio. 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 MMA was initially 330 (normal). Repeat MMA was elevated on 08/01/2016 confirming B12 deficiency. Hepatitis B surface antibody was positive and hepatitis B surface antigen was negative on 08/06/2016.  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 There was no adenopathy. Spleen was normal. Abdominal ultrasoundon 04/15/2017 revealed a normal spleen and sludge in the gallbladder. The liver was echogenic consistent fatty infiltration and/or hepatocellular disease.  She underwent PTCA and stent placementin right and left common iliac arteries and left external iliac artery on 07/10/2016.She is on Plavix.EGD on 03/19/2015 revealed gastritis in the body and antrum. Colonoscopyon 03/19/2015 revealed one hyperplastic polyp. EGDon 07/08/2016 was  normal. No evidence of  bleeding.  She has received6 units of warmed PRBCsto date (last on 08/01/2016).If Rituxan is needed, she requires entecavir 0.5 mg q day beginning 2 weeks prior to Rituxan. In addition, hepatitis B viral level and LFTs should be checked monthly.  She began steroids(1 mg/kg) on 08/01/2016. She stopped prednisoneon 03/12/2017. She is onfolic acid1 mg 3 days/week.   She hasB12 deficiency. B12 was 231 on 07/09/2016. She is on oral B12.  She developed peri-oral and intranasalherpes simplex-1. She was treated with valacyclovir and doxycycline on 10/19/2016. She developed a transient elevated alkaline phosphataseon 10/19/2016 which subsequently normalized.  She was documented to have awarm autoantibodyon 10/12/2017. LDH and bilirubin were normal. Hematocrit improved. She began prednisone10 mg on 12/07/2017 (discontinued 03/2018).She received Rituxanweekly x 4 (last dose 06/16/2018).  Entecavir was discontinued on 07/06/2019.  She developedflu like symptomson 10/26/2017. Symptoms included cough, myalgias, and fever (tmax unknown). She was prescribed Mucinex, Tamiflu, and amoxicillin. She only took amoxicillin x 5 days. She developedincreased liver function testson 11/02/2017. CMV IgG was positive. EBV VCA IgG, NA IgG, early antigen antibody IgG were elevated. EBV VCA IgM was <36. Testing was c/w a convalescence/past infection or reactivated infection. LFTs normalized on 11/15/2017. Smooth muscle antibodywas 39 (high) on 11/10/2017 and 34 (high) on 11/30/2017.   Labs on 06/12/2020 revealed recurrent anemia.  Hematocrit was 27.6, hemoglobin 8.4, MCV 104.5, platelets 138,000, WBC 3,400 (ANC 2,300). Creatinine was 1.17 (CrCl 50 ml/min).  Normal labs included ferritin (434), iron studies, B12 (885), folate (> 100).  Coombs polyspecific AGH test was positive (warm autoantibody).  Reticulocyte count was 7.1%.  LDH was 204 (98-192).  Peripheral smear revealed macrocytic  anemia with mild thrombocytopenia. The morphology of RBCs, WBCs, and platelets were within normal limits. There was no evidence of circulating blasts or schistocytes.  She began prednisone 1 mg/kg (80 mg) on 06/16/2020.  Prednisone was decreased to 20 mg a day on 07/24/2020.  She is currently on prednisone 10 mg a day.  She received Rituxan 1000 mg on day 1 and 15 (07/15/2020 and 07/29/2020).  She began entecavir on 06/28/2020.  She began Septra for PCP prophylaxis on 08/05/2020.   Chest CTon 11/16/2017 revealed a 2.2 x 2.0 x 2.4 spiculated RIGHT middle lobe mass. There was tiny nonspecific upper lobe pulmonary nodules greater on RIGHT, largest 3 mm, of uncertain etiology. There was additional 8 x 8 mm opacity in the posterior sulcus of the RIGHT lower lobe. PET scanon 11/22/2017 revealed a 2 cm hypermetabolic right middle lobe lung mass (SUV 3.4) consistent with primary lung neoplasm. There were no findings for mediastinal/hilar lymphadenopathy or metastatic disease. There were areas of hypermetabolism involving the left oblique abdominal muscles and the anorectal junction.  PFTs on 11/18/2017 revealed an FEV1 of 1.22 liters (51%). DLCO adj was 6.8 mL/mmHg/min (32%).  ENB done on 12/07/2017. Cytology was negative for malignancy. Pathologydemonstrated organizing pneumonia and chronic bronchitis. Pathologist commented that organizing pneumonia spanned about 2 mm in one fragment. Cases of focal organizing pneumonia with hypermetabolism on FDG-PET have been described, but changes of this type can also be adjacent to a neoplasm. Patient prescribed a daily dose of Prednisone 10 mg, with re-imaging planned in 6-8 weeks.   Low dose chest CT on 07/24/2020 revealed Lung-RADS 2, benign appearance or behavior. There was emphysema, aortic atherosclerosis, and coronary artery calcifications.   She has diarrhea. 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 She received  azithromycin x 3 days. She received  ciprofloxacin x 10 days without improvement. She has seen ID in [place]. She began Bactrim on 03/04/2018 (discontinued on 03/11/2018) secondary to increase in creatinine.  She has renal insufficiency. Creatininehas been followed: 1.17 on 11/05/2017, 1.85 on 11/09/2017, 1.38 on 11/12/2017, 1.74 on 11/15/2017, 1.66 on 11/17/2017, and 1.48 on 11/23/2017. Urinalysis on 11/15/2017 revealed revealed no hemoglobin, bilirubin or active sediment.  Liver function tests increased on 02/15/2019.  Work-up on 02/16/2019 revealed hepatitis B E antibody positive.  Negative studies included: hepatitis A total antibody, hepatitis A IgM, hepatitis B surface antigen, hepatitis B E antigen.  Hepatitis B DNA was not detected.  Folate was 80.5 and vitamin B12 was 443.  She has received her vaccinations:  She has completed the PCV13, PPSV23, and HiB.  She received Menvio (quadrivalent meningoccal vaccine) and Bexero (univalent serogroup B meningoccal vaccine) on 07/06/2019.  She was admitted to [facility] from 07/16/2020 - 07/18/2020 with a fever and UTI.  Blood and urine culture grew E coli.  She received ceftriaxone and was discharged on Septra DS 1 tablet BID.  Septra was switched to Keflex secondary to renal insufficiency.  She notes that she tested positive for COVID-19 on 08/21/2019.  She received the Moderna COVID-19 vaccine on 11/10/2019 and 12/08/2019.   Symptomatically,  she feels "ok."  She has some diarrhea. She denies any changes in her urine. She still has splotches on her face.  Hemoglobin is 10.9.  Plan: 1.   Labs today: CBC with diff, BMP, LDH. 2.Warm autoimmune hemolytic anemia Clinically, she is doing well.       She has warm autoimmune hemolytic anemia responding well to Rituxan and a steroid taper. Hematocrit 27.6.  Hemoglobin   8.4.  LDH 204 (98-192) on 06/12/2020.                   Work-up confirmed warm autoantibodies.       Hematocrit  30.4.  Hemoglobin   9.2.  LDH 164 on 06/19/2020.                   She began prednisone 1 mg/kg (80 mg) on 06/16/2020.  Hematocrit 33.7.  Hemoglobin 10.9.  LDH 286 on 08/12/2020.       She is s/p day 1 and 15 Rituxan (last 07/29/2020.       Continue prednisone 10 mg a day.       Continue to monitor. 3.Hepatitis B Patient was previously exposed to hepatitis B. She was on entecavir x 1 year when she previously received Rituxan (stopped 07/06/2019). Patient did not complete her meningococcal vaccines.       Liver function tests remain.       She began entecavir 0.5 mg daily on 06/28/2020.                   Dose adjusted based on renal function.                   CrCl 30-49 ml/min   50% dosing (0.5 mg QOD).                   CrCl >= 50 ml/min 100% dosing (0.5 mg a day).       Continue entecevair 0.5 mg a day. 4.B12 deficiency B12 was 443 on 02/15/2019 and 885 on 06/12/2020.             She is on oral B12 500 mcg a day.  Folate was 20.7 on 07/29/2020  Continue folic acid 1 mg 3 days/week (Mondays, Wednesdays and Fridays). 5. Right middle lobe nodule             Chest CT on 12/26/2019 revealed stable sub-cm bilateral pulmonary nodules c/w benign etiology.                          There was a new airspace disease in inferior right middle lobe, with a few adjacent subcentimeter nodules.                          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             Low-dose chest CT on 07/24/2020 revealedLung-RADS 2, benign appearance or behavior.             Follow-up with pulmonary medicine.   6.   Facial rash             Etiology is unclear.             Etiology is not felt secondary to Rituxan.             Patient is scheduled to follow-up with rheumatology (patient has SLE). 7.   PCP prophylaxis             Patient on PCP prophylaxis.             Spoke with nephrology who agrees with a trial of  Septra.                         If creatinine increases, will begin dapsone daily. 8.   Diarrhea  Stool for C diff and GI panel by PCR.  Schedule f/u appt with [facility] re: IBS. 9.   RTC in 1 week for labs (CBC with diff, BMP, LDH). 10.   RTC in 2 weeks for MD assessment, labs (CBC with diff, BMP, LDH), and prednisone taper.  Addendum:  Stool + C diff toxin.  Patient contacted.  Begin vancomycin 250 mg po q 6 hours.   I discussed the assessment and treatment plan with the patient.  The patient was provided an opportunity to ask questions and all were answered.  The patient agreed with the plan and demonstrated an understanding of the instructions.  The patient was advised to call back or seek an in person evaluation if the symptoms worsen or if the condition fails to improve as anticipated.   Nolon Stalls, MD, PhD  08/12/2020, 9:43 AM  I, Mirian Mo Tufford, am acting as Education administrator for [employer]. Mike Gip, MD, PhD.  I, Hester Joslin C. Mike Gip, MD, have reviewed the above documentation for accuracy and completeness, and I agree with the above. [name] Kitchen

## 2020-08-11 DIAGNOSIS — M329 Systemic lupus erythematosus, unspecified: Secondary | ICD-10-CM | POA: Insufficient documentation

## 2020-08-11 DIAGNOSIS — E242 Drug-induced Cushing's syndrome: Secondary | ICD-10-CM | POA: Insufficient documentation

## 2020-08-11 DIAGNOSIS — B181 Chronic viral hepatitis B without delta-agent: Secondary | ICD-10-CM | POA: Insufficient documentation

## 2020-08-12 ENCOUNTER — Other Ambulatory Visit: Payer: Self-pay

## 2020-08-12 ENCOUNTER — Inpatient Hospital Stay: Payer: BC Managed Care – PPO

## 2020-08-12 ENCOUNTER — Encounter: Payer: Self-pay | Admitting: Hematology and Oncology

## 2020-08-12 ENCOUNTER — Inpatient Hospital Stay: Payer: BC Managed Care – PPO | Admitting: Hematology and Oncology

## 2020-08-12 VITALS — BP 141/68 | HR 84 | Temp 95.2°F | Resp 16 | Wt 176.6 lb

## 2020-08-12 DIAGNOSIS — B1910 Unspecified viral hepatitis B without hepatic coma: Secondary | ICD-10-CM | POA: Diagnosis not present

## 2020-08-12 DIAGNOSIS — R768 Other specified abnormal immunological findings in serum: Secondary | ICD-10-CM

## 2020-08-12 DIAGNOSIS — E538 Deficiency of other specified B group vitamins: Secondary | ICD-10-CM

## 2020-08-12 DIAGNOSIS — D696 Thrombocytopenia, unspecified: Secondary | ICD-10-CM

## 2020-08-12 DIAGNOSIS — D591 Autoimmune hemolytic anemia, unspecified: Secondary | ICD-10-CM

## 2020-08-12 DIAGNOSIS — R197 Diarrhea, unspecified: Secondary | ICD-10-CM

## 2020-08-12 DIAGNOSIS — N289 Disorder of kidney and ureter, unspecified: Secondary | ICD-10-CM | POA: Diagnosis not present

## 2020-08-12 DIAGNOSIS — A0472 Enterocolitis due to Clostridium difficile, not specified as recurrent: Secondary | ICD-10-CM

## 2020-08-12 LAB — GASTROINTESTINAL PANEL BY PCR, STOOL (REPLACES STOOL CULTURE)

## 2020-08-12 LAB — LACTATE DEHYDROGENASE: LDH: 286 U/L — ABNORMAL HIGH (ref 98–192)

## 2020-08-12 LAB — CBC WITH DIFFERENTIAL/PLATELET
Abs Immature Granulocytes: 0.12 10*3/uL — ABNORMAL HIGH (ref 0.00–0.07)
Basophils Absolute: 0 10*3/uL (ref 0.0–0.1)
Basophils Relative: 1 %
Eosinophils Absolute: 0.1 10*3/uL (ref 0.0–0.5)
Eosinophils Relative: 1 %
HCT: 33.7 % — ABNORMAL LOW (ref 36.0–46.0)
Hemoglobin: 10.9 g/dL — ABNORMAL LOW (ref 12.0–15.0)
Immature Granulocytes: 2 %
Lymphocytes Relative: 26 %
Lymphs Abs: 1.4 10*3/uL (ref 0.7–4.0)
MCH: 31.7 pg (ref 26.0–34.0)
MCHC: 32.3 g/dL (ref 30.0–36.0)
MCV: 98 fL (ref 80.0–100.0)
Monocytes Absolute: 0.5 10*3/uL (ref 0.1–1.0)
Monocytes Relative: 8 %
Neutro Abs: 3.3 10*3/uL (ref 1.7–7.7)
Neutrophils Relative %: 62 %
Platelets: 126 10*3/uL — ABNORMAL LOW (ref 150–400)
RBC: 3.44 MIL/uL — ABNORMAL LOW (ref 3.87–5.11)
RDW: 15.7 % — ABNORMAL HIGH (ref 11.5–15.5)
WBC: 5.4 10*3/uL (ref 4.0–10.5)
nRBC: 0 % (ref 0.0–0.2)

## 2020-08-12 LAB — COMPREHENSIVE METABOLIC PANEL
ALT: 22 U/L (ref 0–44)
AST: 16 U/L (ref 15–41)
Albumin: 4.1 g/dL (ref 3.5–5.0)
Alkaline Phosphatase: 52 U/L (ref 38–126)
Anion gap: 13 (ref 5–15)
BUN: 24 mg/dL — ABNORMAL HIGH (ref 8–23)
CO2: 28 mmol/L (ref 22–32)
Calcium: 8.8 mg/dL — ABNORMAL LOW (ref 8.9–10.3)
Chloride: 98 mmol/L (ref 98–111)
Creatinine, Ser: 1.15 mg/dL — ABNORMAL HIGH (ref 0.44–1.00)
GFR, Estimated: 54 mL/min — ABNORMAL LOW (ref >60)
Glucose, Bld: 77 mg/dL (ref 70–99)
Potassium: 3.9 mmol/L (ref 3.5–5.1)
Sodium: 139 mmol/L (ref 135–145)
Total Bilirubin: 0.6 mg/dL (ref 0.3–1.2)
Total Protein: 6.8 g/dL (ref 6.5–8.1)

## 2020-08-12 LAB — C DIFFICILE QUICK SCREEN W PCR REFLEX
C Diff antigen: POSITIVE — AB
C Diff interpretation: DETECTED
C Diff toxin: POSITIVE — AB

## 2020-08-12 MED ORDER — VANCOMYCIN HCL 125 MG PO CAPS: 125.0000 mg | ORAL_CAPSULE | Freq: Four times a day (QID) | ORAL | 0 refills | Status: AC

## 2020-08-12 NOTE — Progress Notes (Signed)
Still has rash and swelling on her face. Has not improved since last visit.

## 2020-08-12 NOTE — Patient Instructions (Signed)
  Continue prednisone 10 mg a day.

## 2020-08-13 ENCOUNTER — Ambulatory Visit
Admit: 2020-08-13 | Discharge: 2020-08-14 | Payer: PRIVATE HEALTH INSURANCE | Attending: Internal Medicine | Primary: Internal Medicine

## 2020-08-13 DIAGNOSIS — R768 Other specified abnormal immunological findings in serum: Principal | ICD-10-CM

## 2020-08-13 DIAGNOSIS — Z79899 Other long term (current) drug therapy: Principal | ICD-10-CM

## 2020-08-13 DIAGNOSIS — Z8619 Personal history of other infectious and parasitic diseases: Principal | ICD-10-CM

## 2020-08-13 DIAGNOSIS — D591 AIHA (autoimmune hemolytic anemia) (CMS-HCC): Principal | ICD-10-CM

## 2020-08-13 MED ORDER — ENTECAVIR 0.5 MG TABLET
ORAL_TABLET | Freq: Every day | ORAL | 2 refills | 30.00000 days | Status: CP
Start: 2020-08-13 — End: 2020-10-15

## 2020-08-13 MED ORDER — VANCOMYCIN 125 MG CAPSULE
0 days
Start: 2020-08-13 — End: 2020-10-15

## 2020-08-19 ENCOUNTER — Inpatient Hospital Stay: Payer: BC Managed Care – PPO

## 2020-08-19 ENCOUNTER — Other Ambulatory Visit: Payer: Self-pay

## 2020-08-19 DIAGNOSIS — D696 Thrombocytopenia, unspecified: Secondary | ICD-10-CM

## 2020-08-19 DIAGNOSIS — B1910 Unspecified viral hepatitis B without hepatic coma: Secondary | ICD-10-CM | POA: Diagnosis not present

## 2020-08-19 DIAGNOSIS — D591 Autoimmune hemolytic anemia, unspecified: Secondary | ICD-10-CM

## 2020-08-19 LAB — CBC WITH DIFFERENTIAL/PLATELET
Abs Immature Granulocytes: 0.3 10*3/uL — ABNORMAL HIGH (ref 0.00–0.07)
Basophils Absolute: 0 10*3/uL (ref 0.0–0.1)
Basophils Relative: 1 %
Eosinophils Absolute: 0.1 10*3/uL (ref 0.0–0.5)
Eosinophils Relative: 1 %
HCT: 33.9 % — ABNORMAL LOW (ref 36.0–46.0)
Hemoglobin: 11.1 g/dL — ABNORMAL LOW (ref 12.0–15.0)
Immature Granulocytes: 4 %
Lymphocytes Relative: 20 %
Lymphs Abs: 1.4 10*3/uL (ref 0.7–4.0)
MCH: 32.2 pg (ref 26.0–34.0)
MCHC: 32.7 g/dL (ref 30.0–36.0)
MCV: 98.3 fL (ref 80.0–100.0)
Monocytes Absolute: 0.5 10*3/uL (ref 0.1–1.0)
Monocytes Relative: 7 %
Neutro Abs: 4.6 10*3/uL (ref 1.7–7.7)
Neutrophils Relative %: 67 %
Platelets: 135 10*3/uL — ABNORMAL LOW (ref 150–400)
RBC: 3.45 MIL/uL — ABNORMAL LOW (ref 3.87–5.11)
RDW: 16.5 % — ABNORMAL HIGH (ref 11.5–15.5)
WBC: 6.8 10*3/uL (ref 4.0–10.5)
nRBC: 0 % (ref 0.0–0.2)

## 2020-08-19 LAB — BASIC METABOLIC PANEL
Anion gap: 10 (ref 5–15)
BUN: 31 mg/dL — ABNORMAL HIGH (ref 8–23)
CO2: 30 mmol/L (ref 22–32)
Calcium: 9.3 mg/dL (ref 8.9–10.3)
Chloride: 99 mmol/L (ref 98–111)
Creatinine, Ser: 1.02 mg/dL — ABNORMAL HIGH (ref 0.44–1.00)
GFR, Estimated: >60 mL/min (ref >60)
Glucose, Bld: 95 mg/dL (ref 70–99)
Potassium: 3.9 mmol/L (ref 3.5–5.1)
Sodium: 139 mmol/L (ref 135–145)

## 2020-08-19 LAB — LACTATE DEHYDROGENASE: LDH: 261 U/L — ABNORMAL HIGH (ref 98–192)

## 2020-08-19 LAB — FOLATE: Folate: 92 ng/mL (ref >5.9)

## 2020-08-20 ENCOUNTER — Telehealth: Payer: Self-pay

## 2020-08-20 NOTE — Telephone Encounter (Signed)
-----   Message from Lequita Asal, MD sent at 08/20/2020  1:01 PM EST ----- Regarding: RE: Please call patient  Let's decrease her folic acid to 1 pill a week.  Check folate in 1 month.  M  ----- Message ----- From: Vito Berger, CMA Sent: 08/20/2020  10:41 AM EST To: Lequita Asal, MD Subject: RE: Please call patient                        The patient is taking Folate 1 mg every other day. ----- Message ----- From: Lequita Asal, MD Sent: 08/19/2020   4:55 PM EST To: Vito Berger, CMA Subject: Please call patient                             Folate level high.  How much folate is she taking?  M  ----- Message ----- From: Buel Ream, [facility] [place] Sent: 08/19/2020  10:43 AM EST To: Lequita Asal, MD

## 2020-08-20 NOTE — Telephone Encounter (Signed)
-----   Message from Lequita Asal, MD sent at 08/19/2020  4:55 PM EST ----- Regarding: Please call patient  Folate level high.  How much folate is she taking?  M  ----- Message ----- From: Buel Ream, [facility] [place] Sent: 08/19/2020  10:43 AM EST To: Lequita Asal, MD

## 2020-08-20 NOTE — Telephone Encounter (Signed)
Spoke with the patient to inform her that her folate was to high and how much was she taking. The patient is taking Folate 1 mg every other day. I have informed Dr Mike Gip.

## 2020-08-20 NOTE — Telephone Encounter (Signed)
Left a message to inform the patient to inform her Per Dr Mike Gip decrease folate to 1 time a week.

## 2020-08-22 NOTE — Progress Notes (Signed)
[facility]  [address], [address] [place], [place] [address] Phone: [phone]  Fax: [phone]   Clinic Day:  08/26/2020  Referring physician: Frazier Richards, MD  Chief Complaint: Tina Page is a 64 y.o. 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  HPI: The patient was last seen in the hematology clinic on 08/12/2020. At that time, she felt "ok".   She still had some diarrhea.  Hematocrit was 33.7, hemoglobin 10.9, MCV 98.0, platelets 126,000, WBC 5,400. BUN was 24. Creatinine was 1.15 (CrCl 54 ml/min). Calcium was 8.8.  LDH was 286.  She was to see GI regarding her IBS.  Stool returned positive for C diff toxin. GI panel by PCR was normal.  She began vancomycin QID for 10 days.  The patient saw Dr. Maudie Mercury [facility] ID) on 08/13/2020. She was to continue entacavir. Follow up was planned for 2 months.  Labs on 08/19/2020 revealed a hematocrit of 33.9, hemoglobin 11.1, MCV 98.3, platelets 135,000, WBC 6,800. BUN was 31. Creatinine was 1.02. Folate was 92.0.  LDH was 261.  Folate was decreased from 3 times/week to once a week.  During the interim, she has been "ok." She feels about the same as she did last week. She is finishing vancomycin today; she started late because her pharmacy did not have it. Her diarrhea has eased up; she only has one bowel movement per day. Before, she was having 3-4 per day. She denies fevers, blood or mucous in the stool.  The patient is taking Septra three days per week (MWF). She is going to start taking folic acid once per week. She is taking prednisone 10 mg/day.  The patient still has a splotchy rash on her face. She is seeing rheumatology on 09/05/2020.   Past Medical History:  Diagnosis Date  . Anemia   . Atherosclerosis of native arteries of extremity with intermittent claudication ([place]) 07/20/2016  . COPD  (chronic obstructive pulmonary disease) ([place])   . 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 Elevated liver function tests 11/03/2017  . GERD (gastroesophageal reflux disease)   . Heme positive stool 01/29/2015  . Hepatitis C 07/12/2016  . HLD (hyperlipidemia)   . Hypertension   . Hypothyroidism   . 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 Thrombocytopenia ([place]) 10/04/2016    Past Surgical History:  Procedure Laterality Date  . ELECTROMAGNETIC NAVIGATION BROCHOSCOPY N/A 12/07/2017   Procedure: ELECTROMAGNETIC NAVIGATION BRONCHOSCOPY;  Surgeon: Flora Lipps, MD;  Location: [facility] ORS;  Service: Cardiopulmonary;  Laterality: N/A;  . ESOPHAGOGASTRODUODENOSCOPY (EGD) WITH PROPOFOL N/A 07/08/2016   Procedure: ESOPHAGOGASTRODUODENOSCOPY (EGD) WITH PROPOFOL;  Surgeon: Manya Silvas, MD;  Location: [facility] ENDOSCOPY;  Service: Endoscopy;  Laterality: N/A;  . KNEE SURGERY Right    repair of acl tear  . PERIPHERAL VASCULAR CATHETERIZATION N/A 07/10/2016   Procedure: Lower Extremity Angiography;  Surgeon: Katha Cabal, MD;  Location: [place] CV LAB;  Service: Cardiovascular;  Laterality: N/A;  . PERIPHERAL VASCULAR CATHETERIZATION N/A 07/10/2016   Procedure: Abdominal Aortogram w/Lower Extremity;  Surgeon: Katha Cabal, MD;  Location: [place] CV LAB;  Service: Cardiovascular;  Laterality: N/A;  . PERIPHERAL VASCULAR CATHETERIZATION  07/10/2016   Procedure: Lower Extremity Intervention;  Surgeon: Katha Cabal, MD;  Location: [place] CV LAB;  Service: Cardiovascular;;    Family History  Problem Relation Age  of Onset  . Diabetes Mother   . Hypertension Mother   . Diabetes Maternal Grandfather   . Hypertension Maternal Grandfather   . Breast cancer Neg Hx     Social History:  reports that she quit smoking about 4 years ago. Her smoking use included cigarettes. She has a 58.75 pack-year smoking history. She has never used smokeless tobacco.  She reports that she does not drink alcohol and does not use drugs. She has a 21 pack year smoking history (1/2 pack/day from age [age]). The patient works at [employer] She is exposed to cold temperatures. She works 3rd shift. She has a daughter, [name] and a daughter named [name]. She lives in [place]. The patient is alone today.  Allergies:  Allergies  Allergen Reactions  . Ciprofloxacin Swelling    Facial swelling following single oral dose.   . Codeine Anaphylaxis  . Nsaids Other (See Comments)    PT HAS HEPC    Current Medications: Current Outpatient Medications  Medication Sig Dispense Refill  . albuterol (PROVENTIL HFA;VENTOLIN HFA) 108 (90 Base) MCG/ACT inhaler Inhale 2 puffs into the lungs every 6 (six) hours as needed for wheezing or shortness of breath.     . allopurinol (ZYLOPRIM) 100 MG tablet TAKE 1 TABLET BY MOUTH EVERY DAY (Patient taking differently: Take 100 mg by mouth daily.) 30 tablet 5  . clopidogrel (PLAVIX) 75 MG tablet Take 1 tablet (75 mg total) by mouth daily. 30 tablet 5  . cyanocobalamin 500 MCG tablet Take 500 mcg by mouth daily.    . diclofenac Sodium (VOLTAREN) 1 % GEL Apply 2 g topically 4 (four) times daily. 100 g 2  . entecavir (BARACLUDE) 0.5 MG tablet Take by mouth.    . folic acid (FOLVITE) 1 MG tablet TAKE 1 TABLET (1 MG TOTAL) BY MOUTH DAILY. 90 tablet 1  . hydrochlorothiazide (HYDRODIURIL) 25 MG tablet Take 25 mg by mouth daily.    [name] Kitchen levothyroxine (SYNTHROID, LEVOTHROID) 75 MCG tablet Take 75 mcg by mouth daily before breakfast.     . lisinopril (PRINIVIL,ZESTRIL) 20 MG tablet Take 20 mg by mouth daily.     [name] Kitchen omeprazole (PRILOSEC) 20 MG capsule Take 1 capsule (20 mg total) by mouth daily. 60 capsule 0  . pravastatin (PRAVACHOL) 40 MG tablet Take 40 mg by mouth every evening.    . predniSONE (DELTASONE) 20 MG tablet Take 2 tablets (40 mg total) by mouth daily with breakfast. (Patient taking differently: Take 10 mg by mouth  daily with breakfast.) 30 tablet 0  . sulfamethoxazole-trimethoprim (BACTRIM DS) 800-160 MG tablet Take 1 tablet by mouth 3 (three) times a week. Take on Mondays, Wednesdays, Fridays 20 tablet 1  . Tiotropium Bromide-Olodaterol (STIOLTO RESPIMAT) 2.5-2.5 MCG/ACT AERS Inhale 2 puffs into the lungs daily.    . mupirocin ointment (BACTROBAN) 2 % Apply topically 2 (two) times daily. (Patient not taking: No sig reported) 22 g 0  . potassium chloride (KLOR-CON) 20 MEQ packet Take 20 mEq by mouth daily. (Patient not taking: No sig reported)    . Tiotropium Bromide Monohydrate (SPIRIVA RESPIMAT) 2.5 MCG/ACT AERS Inhale 2 puffs into the lungs daily for 1 day. 1 each 0   No current facility-administered medications for this visit.    Review of Systems  Constitutional: Negative for chills, diaphoresis, fever, malaise/fatigue and weight loss (up 6 lbs).       Feels "ok."  HENT: Negative for congestion, ear discharge, ear pain, hearing loss, nosebleeds, sinus pain, sore  throat and tinnitus.   Eyes: Negative for blurred vision.  Respiratory: Negative for cough, hemoptysis, sputum production and shortness of breath.   Cardiovascular: Negative for chest pain, palpitations and leg swelling.  Gastrointestinal: Positive for diarrhea (improved to once daily). Negative for abdominal pain, blood in stool, constipation, heartburn, melena, nausea and vomiting.  Genitourinary: Negative for dysuria, frequency, hematuria and urgency.  Musculoskeletal: Negative for back pain, joint pain, myalgias and neck pain.  Skin: Positive for rash (splotches on face). Negative for itching.  Neurological: Negative for dizziness, tingling, sensory change, weakness and headaches.  Endo/Heme/Allergies: Does not bruise/bleed easily.  Psychiatric/Behavioral: Negative for depression and memory loss. The patient is not nervous/anxious and does not have insomnia.   All other systems reviewed and are negative.  Performance status (ECOG):  1  Vital Signs Blood pressure 127/75, pulse 75, temperature (!) 95.2 F (35.1 C), temperature source Tympanic, resp. rate 16, weight 182 lb 5.1 oz (82.7 kg), SpO2 100 %.  Physical Exam Vitals and nursing note reviewed.  Constitutional:      General: She is not in acute distress.    Appearance: She is not diaphoretic.  HENT:     Head: Normocephalic and atraumatic.     Comments: Long gray hair.    Mouth/Throat:     Mouth: Mucous membranes are moist.     Pharynx: Oropharynx is clear. No oropharyngeal exudate or posterior oropharyngeal erythema.  Eyes:     General: No scleral icterus.    Extraocular Movements: Extraocular movements intact.     Conjunctiva/sclera: Conjunctivae normal.     Pupils: Pupils are equal, round, and reactive to light.     Comments: Dark rimmed glasses.  Cardiovascular:     Rate and Rhythm: Normal rate and regular rhythm.     Heart sounds: Normal heart sounds. No murmur heard.   Pulmonary:     Effort: Pulmonary effort is normal. No respiratory distress.     Breath sounds: Normal breath sounds. No wheezing or rales.  Chest:     Chest wall: No tenderness.  Breasts:     Right: No axillary adenopathy or supraclavicular adenopathy.     Left: No axillary adenopathy or supraclavicular adenopathy.    Abdominal:     General: Bowel sounds are normal. There is no distension.     Palpations: Abdomen is soft. There is no hepatomegaly, splenomegaly or mass.     Tenderness: There is no abdominal tenderness. There is no guarding or rebound.  Musculoskeletal:        General: No swelling or tenderness. Normal range of motion.     Cervical back: Normal range of motion and neck supple.  Lymphadenopathy:     Head:     Right side of head: No preauricular, posterior auricular or occipital adenopathy.     Left side of head: No preauricular, posterior auricular or occipital adenopathy.     Cervical: No cervical adenopathy.     Upper Body:     Right upper body: No  supraclavicular or axillary adenopathy.     Left upper body: No supraclavicular or axillary adenopathy.     Lower Body: No right inguinal adenopathy. No left inguinal adenopathy.  Skin:    General: Skin is warm and dry.     Coloration: Skin is not jaundiced.  Neurological:     Mental Status: She is alert and oriented to person, place, and time.  Psychiatric:        Behavior: Behavior normal.  Thought Content: Thought content normal.        Judgment: Judgment normal.      08/05/2020    Imaging studies: 07/07/2016:  Chest, abdomen, and pelvis CTangiogram revealed moderate diffuse atherosclerotic vascular disease of the abdominal aorta with severe stenosis of the left common iliac artery with suspected short segment occlusion. There was no adenopathy. Spleen was normal.  04/15/2017:  Abdominal ultrasoundrevealed a normal spleen and sludge in the gallbladder. The liver was echogenic consistent fatty infiltration and/or hepatocellular disease. 11/10/2017:  Abdomen and pelvic CTrevealed a 2.2 cm spiculated density in right middle lobe concerning for malignancy.  11/16/2017:  Chest CTon 11/16/2017 revealed a 2.2 x 2.0 x 2.4 spiculated RIGHT middle lobe mass. There was tiny nonspecific upper lobe pulmonary nodules greater on RIGHT, largest 3 mm, of uncertain etiology. There was additional 8 x 8 mm opacity in the posterior sulcus of the RIGHT lower lobe. 11/22/2017:  PET scanrevealed a 2 cm hypermetabolic right middle lobe lung mass (SUV 3.4) consistent with primary lung neoplasm. There were no findings for mediastinal/hilar lymphadenopathy or metastatic disease. There were areas of hypermetabolism involving the left oblique abdominal muscles and the anorectal junction. 02/06/2018:  Chest CT revealed the spiculated 2.2 cm right middle lobe lesion was almost completely resolved with only a residual 4 mm irregular nodule identified at this location on a background of architectural  distortion/evolving scar. There was a 5 mm right parahilar nodule stable since 11/16/2017(not present on 07/07/2016). There were tiny nodules in both upper lobes suggesting inhalation etiology. 06/27/2019:  Chest CT revealed the nodule of the right middle lobe had almost completely resolved, with residual linear scarring. There was persistent paramedian consolidation of the medial right middle lobe and lingula, generally in keeping with atypical infection, particularly atypical mycobacterium. There had been interval increase in size of a small nodule of the right middle lobe, now measuring 4 mm (previously 2 mm).   There was new 6 mm ground-glass opacity of the lateral left upper lobe measuring.  Although nonspecific, these were likely infectious or inflammatory in nature.  Attention on follow-up was receommended. There was unchanged tiny biapical pulmonary nodules, benign sequelae of nonspecific infection or inflammation, possibly related cigarette smoking, atypical infection, or other inhalational, inflammatory lung disease. There was hepatic steatosis. 12/26/2019:  Chest CT on 12/26/2019 revealed stable sub-cm bilateral pulmonary nodules, consistent with benign etiology. There was stable paramedian consolidation in the right middle lobe and lingula. There was a new airspace disease in inferior right middle lobe, with a few adjacent subcentimeter nodules. Overall, above findings were consistent with waxing and waning atypical infectious process, such as MAI.   07/24/2020:  Low dose chest CT revealed Lung-RADS 2, benign appearance or behavior. There was emphysema, aortic atherosclerosis, and coronary artery calcifications.   Appointment on 08/26/2020  Component Date Value Ref Range Status  . LDH 08/26/2020 234* 98 - 192 U/L Final   Performed at [facility], [address] [place], [place] [address]  . Sodium 08/26/2020 139  135 - 145 mmol/L Final  . Potassium 08/26/2020 3.7  3.5 - 5.1  mmol/L Final  . Chloride 08/26/2020 102  98 - 111 mmol/L Final  . CO2 08/26/2020 28  22 - 32 mmol/L Final  . Glucose, Bld 08/26/2020 89  70 - 99 mg/dL Final   Glucose reference range applies only to samples taken after fasting for at least 8 hours.  . BUN 08/26/2020 29* 8 - 23 mg/dL Final  . Creatinine,  Ser 08/26/2020 1.04* 0.44 - 1.00 mg/dL Final  . Calcium 08/26/2020 9.1  8.9 - 10.3 mg/dL Final  . GFR, Estimated 08/26/2020 >60  >60 mL/min Final   Comment: (NOTE) Calculated using the CKD-EPI Creatinine Equation (2021)   . Anion gap 08/26/2020 9  5 - 15 Final   Performed at [facility], [address], [place], [place] [address]  . WBC 08/26/2020 6.0  4.0 - 10.5 K/uL Final  . RBC 08/26/2020 3.39* 3.87 - 5.11 MIL/uL Final  . Hemoglobin 08/26/2020 10.9* 12.0 - 15.0 g/dL Final  . HCT 08/26/2020 33.9* 36.0 - 46.0 % Final  . MCV 08/26/2020 100.0  80.0 - 100.0 fL Final  . MCH 08/26/2020 32.2  26.0 - 34.0 pg Final  . MCHC 08/26/2020 32.2  30.0 - 36.0 g/dL Final  . RDW 08/26/2020 16.6* 11.5 - 15.5 % Final  . Platelets 08/26/2020 131* 150 - 400 K/uL Final  . nRBC 08/26/2020 0.0  0.0 - 0.2 % Final  . Neutrophils Relative % 08/26/2020 69  % Final  . Neutro Abs 08/26/2020 4.2  1.7 - 7.7 K/uL Final  . Lymphocytes Relative 08/26/2020 20  % Final  . Lymphs Abs 08/26/2020 1.2  0.7 - 4.0 K/uL Final  . Monocytes Relative 08/26/2020 8  % Final  . Monocytes Absolute 08/26/2020 0.5  0.1 - 1.0 K/uL Final  . Eosinophils Relative 08/26/2020 1  % Final  . Eosinophils Absolute 08/26/2020 0.1  0.0 - 0.5 K/uL Final  . Basophils Relative 08/26/2020 1  % Final  . Basophils Absolute 08/26/2020 0.0  0.0 - 0.1 K/uL Final  . Immature Granulocytes 08/26/2020 1  % Final  . Abs Immature Granulocytes 08/26/2020 0.07  0.00 - 0.07 K/uL Final   Performed at [facility], [address], [place], [place] [address]    Assessment:  Tina Page is a 64 y.o. female withdiscoid lupusand a  history of hepatitis B with autoimmune hemolytic anemia. One week prior to presentation, she felt like she was coming down with something. She denied any fever, runny nose, sore throat or cough. She had some diarrhea. She denied any new medications or herbal products.  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 Reticulocyte count was 11.3%. Ferritin was 562. Iron studies revealed a saturation of 20% and a TIBC of 214 (low). B12 was 231 (low normal). Folate was 42. Peripheral smear revealed rouleaux formation. Labs on 06/21/2019revealed normal flow cytometry, SPEP, and immunoglobulins.  Additional testingincluded the following+ studies: hepatitis C antibody,hepatitis B core antibody,CMV IgM, and EBV VCA (IgM and IgG). Hepatitis B by PCRwas negative. Hepatitis C RNAwas negative. Mycoplasma pneumonia IgM was negative. Reticulocyte count was 11.3% (high) indicating appropriate marrow response. C3 and C4 were normal. Cold agglutinin titer was negative x 2. Negative studies included: ANA, hepatitis B surface antigen, SPEP, and free light chain ratio. 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 MMA was initially 330 (normal). Repeat MMA was elevated on 08/01/2016 confirming B12 deficiency. Hepatitis B surface antibody was positive and hepatitis B surface antigen was negative on 08/06/2016.  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 There was no adenopathy. Spleen was normal. Abdominal ultrasoundon 04/15/2017 revealed a normal spleen and sludge in the gallbladder. The liver was echogenic consistent fatty infiltration and/or hepatocellular disease.  She  underwent PTCA and stent placementin right and left common iliac arteries and  left external iliac artery on 07/10/2016.She is on Plavix.EGD on 03/19/2015 revealed gastritis in the body and antrum. Colonoscopyon 03/19/2015 revealed one hyperplastic polyp. EGDon 07/08/2016 was normal. No evidence of bleeding.  She has received6 units of warmed PRBCsto date (last on 08/01/2016).If Rituxan is needed, she requires entecavir 0.5 mg q day beginning 2 weeks prior to Rituxan. In addition, hepatitis B viral level and LFTs should be checked monthly.  She began steroids(1 mg/kg) on 08/01/2016. She stopped prednisoneon 03/12/2017. She is onfolic acid1 mg a day.   She hasB12 deficiency. B12 was 231 on 07/09/2016. She is on oral B12. B12 and folate were normal on 03/09/2017 and 10/12/2017.  She developed peri-oral and intranasalherpes simplex-1. She was treated with valacyclovir and doxycycline on 10/19/2016. She developed a transient elevated alkaline phosphataseon 10/19/2016 which subsequently normalized.  She was documented to have awarm autoantibodyon 10/12/2017. LDH and bilirubin were normal. Hematocrit improved. She began prednisone10 mg on 12/07/2017 (discontinued 03/2018).She received Rituxanweekly x 4 (last dose 06/16/2018).  Entecavir was discontinued on 07/06/2019.  She developedflu like symptomson 10/26/2017. Symptoms included cough, myalgias, and fever (tmax unknown). She was prescribed Mucinex, Tamiflu, and amoxicillin. She only took amoxicillin x 5 days. She developedincreased liver function testson 11/02/2017. CMV IgG was positive. EBV VCA IgG, NA IgG, early antigen antibody IgG were elevated. EBV VCA IgM was <36. Testing was c/w a convalescence/past infection or reactivated infection. LFTs normalized on 11/15/2017. Smooth muscle antibodywas 39 (high) on 11/10/2017 and 34 (high) on 11/30/2017.   Labs on 06/12/2020 revealed recurrent anemia.  Hematocrit was 27.6, hemoglobin 8.4, MCV 104.5, platelets 138,000, WBC 3,400  (ANC 2,300). Creatinine was 1.17 (CrCl 50 ml/min).  Normal labs included ferritin (434), iron studies, B12 (885), folate (> 100).  Coombs polyspecific AGH test was positive (warm autoantibody).  Reticulocyte count was 7.1%.  LDH was 204 (98-192).  Peripheral smear revealed macrocytic anemia with mild thrombocytopenia. The morphology of RBCs, WBCs, and platelets were within normal limits. There was no evidence of circulating blasts or schistocytes.  She began prednisone 1 mg/kg (80 mg) on 06/16/2020.  Prednisone was decreased to 20 mg a day on 111/24/2021.  She received Rituxan 1000 mg on day 1 and 15 (07/15/2020 and 07/29/2020).  She began entecavir on 06/28/2020.  She is on Septra for PCP prophylaxis.   Chest CTon 11/16/2017 revealed a 2.2 x 2.0 x 2.4 spiculated RIGHT middle lobe mass. There was tiny nonspecific upper lobe pulmonary nodules greater on RIGHT, largest 3 mm, of uncertain etiology. There was additional 8 x 8 mm opacity in the posterior sulcus of the RIGHT lower lobe. PET scanon 11/22/2017 revealed a 2 cm hypermetabolic right middle lobe lung mass (SUV 3.4) consistent with primary lung neoplasm. There were no findings for mediastinal/hilar lymphadenopathy or metastatic disease. There were areas of hypermetabolism involving the left oblique abdominal muscles and the anorectal junction.  PFTs on 11/18/2017 revealed an FEV1 of 1.22 liters (51%). DLCO adj was 6.8 mL/mmHg/min (32%).  ENB done on 12/07/2017. Cytology was negative for malignancy. Pathologydemonstrated organizing pneumonia and chronic bronchitis. Pathologist commented that organizing pneumonia spanned about 2 mm in one fragment. Cases of focal organizing pneumonia with hypermetabolism on FDG-PET have been described, but changes of this type can also be adjacent to a neoplasm. Patient prescribed a daily dose of Prednisone 10 mg, with re-imaging planned in 6-8 weeks.   Low dose chest CT on 07/24/2020 revealed Lung-RADS 2,  benign appearance or behavior. There was emphysema, aortic atherosclerosis, and coronary artery calcifications.   She has diarrhea. 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 She received azithromycin x 3 days. She received ciprofloxacin x 10 days without improvement. She has seen ID in [place]. She began Bactrim on 03/04/2018 (discontinued on 03/11/2018) secondary to increase in creatine.  She has renal insufficiency. Creatininehas been followed: 1.17 on 11/05/2017, 1.85 on 11/09/2017, 1.38 on 11/12/2017, 1.74 on 11/15/2017, 1.66 on 11/17/2017, and 1.48 on 11/23/2017. Urinalysis on 11/15/2017 revealed revealed no hemoglobin, bilirubin or active sediment.  Liver function tests increased on 02/15/2019.  Work-up on 02/16/2019 revealed hepatitis B E antibody positive.  Negative studies included: hepatitis A total antibody, hepatitis A IgM, hepatitis B surface antigen, hepatitis B E antigen.  Hepatitis B DNA was not detected.  Folate was 80.5 and vitamin B12 was 443.  She has received her vaccinations:  She has completed the PCV13, PPSV23, and HiB.  She received Menvio (quadrivalent meningoccal vaccine) and Bexero (univalent serogroup B meningoccal vaccine) on 07/06/2019.  She was [facility] to [facility] from 07/16/2020 - 07/18/2020 with a fever and UTI.  Blood and urine culture grew E coli.  She received ceftriaxone and was discharged on Septra DS 1 tablet BID.  Septra was switched to Keflex secondary to renal insufficiency.  She was diagnosed with C difficile + diarrhea on 08/22/2020.  She started oral vancomycin late as her pharmacy did not carry it.  Bowel movements have dramatically improved.  She notes that she tested positive for COVID-19 on 08/21/2019.  She received the Moderna COVID-19 vaccine on 11/10/2019 and 12/08/2019.   Symptomatically,  she feels "ok." Stools have decreased from 3-4 bowel movements/day to 1/day.  Facial rash is stable.   Plan: 1.    Labs today: CBC with diff, BMP, LDH 2.Warm autoimmune hemolytic anemia Clinically, she is doing well.       She active warm autoimmune hemolytic anemia responding well to Rituxan and a steroid taper. Hematocrit 27.6.  Hemoglobin   8.4.  LDH 204 (98-192) on 06/12/2020.                   Work-up confirmed warm autoantibodies.       Hematocrit 30.4.  Hemoglobin   9.2.  LDH 164 (98-192) on 06/19/2020.                   She began prednisone 1 mg/kg (80 mg) on 06/16/2020.       Hematocrit 33.9.  Hemoglobin 10.9.  LDH 234 on 08/26/2020.       She is s/p day 1 and 15 Rituxan (last 07/29/2020).       She is on prednisone 10 mg a day mg.       Discuss plan for ongoing slow taper off prednisone.  Decrease prednisone to 7.5 mg a day.  Continue to monitor every 10 das with taper of steroids. 3.Hepatitis B Patient was previously exposed to hepatitis B. She was on entecavir x 1 year when she previously received Rituxan (stopped 07/06/2019). Patient did not complete her meningococcal vaccines.       She began entecavir 0.5 mg daily on 06/28/2020.                   Dose adjusted based on renal function.                   CrCl 30-49 ml/min   50% dosing (0.5 mg  QOD).                   CrCl >= 50 ml/min 100% dosing (0.5 mg a day).  Creatinine 1.04 (CrCl 52.1 ml/min).  Continue entecavir 0.5 mg a day.       Continue to monitor creatinine and adjust entecavir dose accordingly. 4.B12 deficiency B12 was 443 on 02/15/2019 and 885 on 06/12/2020.             Folate was 80.5 on 02/15/2019 and >100 on 06/12/2020.                         Folate was 92.0 on 08/19/2020.             She is currently on folic acid 1 mg 3 days a week (Mondays, Wednesdays and Fridays).  Decrease folic acid to 1 mg a week. 5. Right middle lobe nodule             Chest CT on 12/26/2019 revealed stable sub-cm bilateral pulmonary nodules c/w benign etiology.                           There was a new airspace disease in inferior right middle lobe, with a few adjacent subcentimeter nodules.                          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             Low-dose chest CT on 07/24/2020 revealedLung-RADS 2, benign appearance or behavior.             Patient is being followed by Dr Mortimer Fries in pulmonary medicine.   6.   Facial rash             Etiology remains unclear.             Etiology is not felt secondary to Rituxan.             Patient has not been seen by rheumatology yet.  Consult dermatology.   7.   PCP prophylaxis             Patient on Septra DA on Mondays, Wednesdays, and Fridays.  Monitor creatinine given past history of increase creatinine with full dose Septra (BID). 8.   C difficile + diarrhea  Patient diagnosed on 08/12/2020.  She has not completed oral vancomycin secondary to delay in obtaining medication.  Diarrhea has dramatically improved.  Retest stool after completion of vancomycin.  9.   RN:  Provide patient with cup for C difficile testing. 10.   RTC every 10 days x 4 for labs (CBC with diff, BMP). 11.   RTC in 40 days for MD assessment, labs (CBC with diff, CMP, LDH), and ongoing prednisone taper.  I discussed the assessment and treatment plan with the patient.  The patient was provided an opportunity to ask questions and all were answered.  The patient agreed with the plan and demonstrated an understanding of the instructions.  The patient was advised to call back or seek an in person evaluation if the symptoms worsen or if the condition fails to improve as anticipated.  I provided 17 minutes of face-to-face time during this encounter and > 50% was spent counseling as documented under my assessment and plan.  An additional 5 minutes were spent reviewing  her chart (Epic and Care Everywhere) including notes, labs, and imaging studies.    Nolon Stalls, MD, PhD  08/26/2020, 10:12 AM  I, Mirian Mo  Tufford, am acting as Education administrator for [employer]. Mike Gip, MD, PhD.  I, Rayetta Veith C. Mike Gip, MD, have reviewed the above documentation for accuracy and completeness, and I agree with the above.

## 2020-08-26 ENCOUNTER — Inpatient Hospital Stay: Payer: BC Managed Care – PPO | Admitting: Hematology and Oncology

## 2020-08-26 ENCOUNTER — Other Ambulatory Visit: Payer: Self-pay

## 2020-08-26 ENCOUNTER — Inpatient Hospital Stay: Payer: BC Managed Care – PPO

## 2020-08-26 ENCOUNTER — Telehealth: Payer: Self-pay | Admitting: Hematology and Oncology

## 2020-08-26 ENCOUNTER — Encounter: Payer: Self-pay | Admitting: Hematology and Oncology

## 2020-08-26 VITALS — BP 127/75 | HR 75 | Temp 95.2°F | Resp 16 | Wt 182.3 lb

## 2020-08-26 DIAGNOSIS — A0472 Enterocolitis due to Clostridium difficile, not specified as recurrent: Secondary | ICD-10-CM

## 2020-08-26 DIAGNOSIS — D591 Autoimmune hemolytic anemia, unspecified: Secondary | ICD-10-CM | POA: Diagnosis not present

## 2020-08-26 DIAGNOSIS — E538 Deficiency of other specified B group vitamins: Secondary | ICD-10-CM | POA: Diagnosis not present

## 2020-08-26 DIAGNOSIS — D696 Thrombocytopenia, unspecified: Secondary | ICD-10-CM

## 2020-08-26 DIAGNOSIS — Z7189 Other specified counseling: Secondary | ICD-10-CM

## 2020-08-26 DIAGNOSIS — B1910 Unspecified viral hepatitis B without hepatic coma: Secondary | ICD-10-CM | POA: Diagnosis not present

## 2020-08-26 DIAGNOSIS — R768 Other specified abnormal immunological findings in serum: Secondary | ICD-10-CM

## 2020-08-26 DIAGNOSIS — R21 Rash and other nonspecific skin eruption: Secondary | ICD-10-CM

## 2020-08-26 DIAGNOSIS — R197 Diarrhea, unspecified: Secondary | ICD-10-CM

## 2020-08-26 LAB — BASIC METABOLIC PANEL
Anion gap: 9 (ref 5–15)
BUN: 29 mg/dL — ABNORMAL HIGH (ref 8–23)
CO2: 28 mmol/L (ref 22–32)
Calcium: 9.1 mg/dL (ref 8.9–10.3)
Chloride: 102 mmol/L (ref 98–111)
Creatinine, Ser: 1.04 mg/dL — ABNORMAL HIGH (ref 0.44–1.00)
GFR, Estimated: >60 mL/min (ref >60)
Glucose, Bld: 89 mg/dL (ref 70–99)
Potassium: 3.7 mmol/L (ref 3.5–5.1)
Sodium: 139 mmol/L (ref 135–145)

## 2020-08-26 LAB — CBC WITH DIFFERENTIAL/PLATELET
Abs Immature Granulocytes: 0.07 10*3/uL (ref 0.00–0.07)
Basophils Absolute: 0 10*3/uL (ref 0.0–0.1)
Basophils Relative: 1 %
Eosinophils Absolute: 0.1 10*3/uL (ref 0.0–0.5)
Eosinophils Relative: 1 %
HCT: 33.9 % — ABNORMAL LOW (ref 36.0–46.0)
Hemoglobin: 10.9 g/dL — ABNORMAL LOW (ref 12.0–15.0)
Immature Granulocytes: 1 %
Lymphocytes Relative: 20 %
Lymphs Abs: 1.2 10*3/uL (ref 0.7–4.0)
MCH: 32.2 pg (ref 26.0–34.0)
MCHC: 32.2 g/dL (ref 30.0–36.0)
MCV: 100 fL (ref 80.0–100.0)
Monocytes Absolute: 0.5 10*3/uL (ref 0.1–1.0)
Monocytes Relative: 8 %
Neutro Abs: 4.2 10*3/uL (ref 1.7–7.7)
Neutrophils Relative %: 69 %
Platelets: 131 10*3/uL — ABNORMAL LOW (ref 150–400)
RBC: 3.39 MIL/uL — ABNORMAL LOW (ref 3.87–5.11)
RDW: 16.6 % — ABNORMAL HIGH (ref 11.5–15.5)
WBC: 6 10*3/uL (ref 4.0–10.5)
nRBC: 0 % (ref 0.0–0.2)

## 2020-08-26 LAB — LACTATE DEHYDROGENASE: LDH: 234 U/L — ABNORMAL HIGH (ref 98–192)

## 2020-08-26 MED ORDER — PREDNISONE 2.5 MG PO TABS: 7.5000 mg | ORAL_TABLET | Freq: Every day | ORAL | 0 refills | Status: DC

## 2020-08-26 NOTE — Patient Instructions (Signed)
  Decrease prednisone to 7.5 mg a day.

## 2020-08-27 ENCOUNTER — Other Ambulatory Visit: Payer: Self-pay

## 2020-08-27 ENCOUNTER — Other Ambulatory Visit: Payer: BC Managed Care – PPO

## 2020-08-27 ENCOUNTER — Telehealth: Payer: Self-pay

## 2020-08-27 DIAGNOSIS — R197 Diarrhea, unspecified: Secondary | ICD-10-CM

## 2020-08-27 DIAGNOSIS — B1910 Unspecified viral hepatitis B without hepatic coma: Secondary | ICD-10-CM | POA: Diagnosis not present

## 2020-08-27 LAB — GASTROINTESTINAL PANEL BY PCR, STOOL (REPLACES STOOL CULTURE)

## 2020-08-27 LAB — C DIFFICILE QUICK SCREEN W PCR REFLEX
C Diff antigen: NEGATIVE
C Diff interpretation: NOT DETECTED
C Diff toxin: NEGATIVE

## 2020-08-27 NOTE — Telephone Encounter (Signed)
Referral for dermatology.

## 2020-08-27 NOTE — Telephone Encounter (Signed)
Called patient to let her know her c diff is gone.

## 2020-09-01 DIAGNOSIS — R197 Diarrhea, unspecified: Secondary | ICD-10-CM | POA: Insufficient documentation

## 2020-09-05 ENCOUNTER — Inpatient Hospital Stay: Payer: BC Managed Care – PPO | Attending: Hematology and Oncology

## 2020-09-05 ENCOUNTER — Other Ambulatory Visit: Payer: Self-pay

## 2020-09-05 DIAGNOSIS — R918 Other nonspecific abnormal finding of lung field: Secondary | ICD-10-CM | POA: Insufficient documentation

## 2020-09-05 DIAGNOSIS — D5911 Warm autoimmune hemolytic anemia: Secondary | ICD-10-CM | POA: Diagnosis present

## 2020-09-05 DIAGNOSIS — Z7902 Long term (current) use of antithrombotics/antiplatelets: Secondary | ICD-10-CM | POA: Diagnosis not present

## 2020-09-05 DIAGNOSIS — Z87891 Personal history of nicotine dependence: Secondary | ICD-10-CM | POA: Diagnosis not present

## 2020-09-05 DIAGNOSIS — E538 Deficiency of other specified B group vitamins: Secondary | ICD-10-CM | POA: Insufficient documentation

## 2020-09-05 DIAGNOSIS — Z8249 Family history of ischemic heart disease and other diseases of the circulatory system: Secondary | ICD-10-CM | POA: Insufficient documentation

## 2020-09-05 DIAGNOSIS — Z833 Family history of diabetes mellitus: Secondary | ICD-10-CM | POA: Diagnosis not present

## 2020-09-05 DIAGNOSIS — B1910 Unspecified viral hepatitis B without hepatic coma: Secondary | ICD-10-CM | POA: Diagnosis not present

## 2020-09-05 DIAGNOSIS — Z79899 Other long term (current) drug therapy: Secondary | ICD-10-CM | POA: Insufficient documentation

## 2020-09-05 DIAGNOSIS — D591 Autoimmune hemolytic anemia, unspecified: Secondary | ICD-10-CM

## 2020-09-05 LAB — BASIC METABOLIC PANEL
Anion gap: 10 (ref 5–15)
BUN: 23 mg/dL (ref 8–23)
CO2: 27 mmol/L (ref 22–32)
Calcium: 9 mg/dL (ref 8.9–10.3)
Chloride: 103 mmol/L (ref 98–111)
Creatinine, Ser: 1.14 mg/dL — ABNORMAL HIGH (ref 0.44–1.00)
GFR, Estimated: 54 mL/min — ABNORMAL LOW (ref >60)
Glucose, Bld: 96 mg/dL (ref 70–99)
Potassium: 3.6 mmol/L (ref 3.5–5.1)
Sodium: 140 mmol/L (ref 135–145)

## 2020-09-05 LAB — CBC WITH DIFFERENTIAL/PLATELET
Abs Immature Granulocytes: 0.06 10*3/uL (ref 0.00–0.07)
Basophils Absolute: 0.1 10*3/uL (ref 0.0–0.1)
Basophils Relative: 1 %
Eosinophils Absolute: 0.1 10*3/uL (ref 0.0–0.5)
Eosinophils Relative: 2 %
HCT: 34.3 % — ABNORMAL LOW (ref 36.0–46.0)
Hemoglobin: 11.1 g/dL — ABNORMAL LOW (ref 12.0–15.0)
Immature Granulocytes: 1 %
Lymphocytes Relative: 25 %
Lymphs Abs: 1.7 10*3/uL (ref 0.7–4.0)
MCH: 32.1 pg (ref 26.0–34.0)
MCHC: 32.4 g/dL (ref 30.0–36.0)
MCV: 99.1 fL (ref 80.0–100.0)
Monocytes Absolute: 0.5 10*3/uL (ref 0.1–1.0)
Monocytes Relative: 8 %
Neutro Abs: 4.2 10*3/uL (ref 1.7–7.7)
Neutrophils Relative %: 63 %
Platelets: 140 10*3/uL — ABNORMAL LOW (ref 150–400)
RBC: 3.46 MIL/uL — ABNORMAL LOW (ref 3.87–5.11)
RDW: 16 % — ABNORMAL HIGH (ref 11.5–15.5)
WBC: 6.6 10*3/uL (ref 4.0–10.5)
nRBC: 0 % (ref 0.0–0.2)

## 2020-09-16 ENCOUNTER — Telehealth: Payer: Self-pay | Admitting: Hematology and Oncology

## 2020-09-16 ENCOUNTER — Inpatient Hospital Stay: Payer: BC Managed Care – PPO

## 2020-09-16 NOTE — Telephone Encounter (Signed)
Re:  CBC results  I reviewed with the patient today regarding her blood counts from 09/05/2020.  Hemoglobin was 11.1.  Creatinine was 1.14.  She is currently on prednisone 7.5 mg a day.  We discussed decreasing her prednisone to 5 mg a day.  Per the patient, she has a follow-up CBC on 09/18/2020.  We will continue to try to taper her prednisone every 10 days x 2.5 mg.    If she has not heard by the nurse at the end of the day when she has had her labs drawn, she is to contact the clinic.   Lequita Asal, MD

## 2020-09-18 ENCOUNTER — Inpatient Hospital Stay: Payer: BC Managed Care – PPO

## 2020-09-18 MED ORDER — TRIAMCINOLONE ACETONIDE 0.025 % TOPICAL CREAM
0.00000 days
Start: 2020-09-18 — End: ?

## 2020-09-19 ENCOUNTER — Inpatient Hospital Stay: Payer: BC Managed Care – PPO

## 2020-09-19 ENCOUNTER — Other Ambulatory Visit: Payer: Self-pay

## 2020-09-19 DIAGNOSIS — D591 Autoimmune hemolytic anemia, unspecified: Secondary | ICD-10-CM

## 2020-09-19 DIAGNOSIS — B1910 Unspecified viral hepatitis B without hepatic coma: Secondary | ICD-10-CM | POA: Diagnosis not present

## 2020-09-19 LAB — CBC WITH DIFFERENTIAL/PLATELET
Abs Immature Granulocytes: 0.05 10*3/uL (ref 0.00–0.07)
Basophils Absolute: 0.1 10*3/uL (ref 0.0–0.1)
Basophils Relative: 1 %
Eosinophils Absolute: 0.1 10*3/uL (ref 0.0–0.5)
Eosinophils Relative: 2 %
HCT: 35.1 % — ABNORMAL LOW (ref 36.0–46.0)
Hemoglobin: 11.5 g/dL — ABNORMAL LOW (ref 12.0–15.0)
Immature Granulocytes: 1 %
Lymphocytes Relative: 17 %
Lymphs Abs: 1.3 10*3/uL (ref 0.7–4.0)
MCH: 31.9 pg (ref 26.0–34.0)
MCHC: 32.8 g/dL (ref 30.0–36.0)
MCV: 97.5 fL (ref 80.0–100.0)
Monocytes Absolute: 0.7 10*3/uL (ref 0.1–1.0)
Monocytes Relative: 10 %
Neutro Abs: 5.3 10*3/uL (ref 1.7–7.7)
Neutrophils Relative %: 69 %
Platelets: 160 10*3/uL (ref 150–400)
RBC: 3.6 MIL/uL — ABNORMAL LOW (ref 3.87–5.11)
RDW: 15.1 % (ref 11.5–15.5)
WBC: 7.5 10*3/uL (ref 4.0–10.5)
nRBC: 0 % (ref 0.0–0.2)

## 2020-09-19 LAB — BASIC METABOLIC PANEL
Anion gap: 11 (ref 5–15)
BUN: 32 mg/dL — ABNORMAL HIGH (ref 8–23)
CO2: 27 mmol/L (ref 22–32)
Calcium: 8.9 mg/dL (ref 8.9–10.3)
Chloride: 99 mmol/L (ref 98–111)
Creatinine, Ser: 1.39 mg/dL — ABNORMAL HIGH (ref 0.44–1.00)
GFR, Estimated: 43 mL/min — ABNORMAL LOW (ref >60)
Glucose, Bld: 108 mg/dL — ABNORMAL HIGH (ref 70–99)
Potassium: 3.8 mmol/L (ref 3.5–5.1)
Sodium: 137 mmol/L (ref 135–145)

## 2020-09-19 NOTE — Progress Notes (Signed)
Called/ informed patient Creatinine clearance is < 50 ml/min to decrease entecavir to every other day per Dr. Mike Gip. Pt is scheduled to recheck labs n 1 week.

## 2020-09-24 ENCOUNTER — Other Ambulatory Visit: Payer: Self-pay

## 2020-09-24 DIAGNOSIS — D591 Autoimmune hemolytic anemia, unspecified: Secondary | ICD-10-CM

## 2020-09-26 ENCOUNTER — Inpatient Hospital Stay: Payer: BC Managed Care – PPO

## 2020-09-26 ENCOUNTER — Telehealth: Payer: Self-pay | Admitting: Hematology and Oncology

## 2020-09-26 ENCOUNTER — Other Ambulatory Visit: Payer: Self-pay

## 2020-09-26 DIAGNOSIS — D591 Autoimmune hemolytic anemia, unspecified: Secondary | ICD-10-CM

## 2020-09-26 DIAGNOSIS — R768 Other specified abnormal immunological findings in serum: Secondary | ICD-10-CM

## 2020-09-26 DIAGNOSIS — B1910 Unspecified viral hepatitis B without hepatic coma: Secondary | ICD-10-CM | POA: Diagnosis not present

## 2020-09-26 LAB — CBC WITH DIFFERENTIAL/PLATELET
Abs Immature Granulocytes: 0.04 10*3/uL (ref 0.00–0.07)
Basophils Absolute: 0.1 10*3/uL (ref 0.0–0.1)
Basophils Relative: 1 %
Eosinophils Absolute: 0.1 10*3/uL (ref 0.0–0.5)
Eosinophils Relative: 2 %
HCT: 35.6 % — ABNORMAL LOW (ref 36.0–46.0)
Hemoglobin: 11.3 g/dL — ABNORMAL LOW (ref 12.0–15.0)
Immature Granulocytes: 1 %
Lymphocytes Relative: 21 %
Lymphs Abs: 1.5 10*3/uL (ref 0.7–4.0)
MCH: 31 pg (ref 26.0–34.0)
MCHC: 31.7 g/dL (ref 30.0–36.0)
MCV: 97.5 fL (ref 80.0–100.0)
Monocytes Absolute: 0.5 10*3/uL (ref 0.1–1.0)
Monocytes Relative: 7 %
Neutro Abs: 4.9 10*3/uL (ref 1.7–7.7)
Neutrophils Relative %: 68 %
Platelets: 166 10*3/uL (ref 150–400)
RBC: 3.65 MIL/uL — ABNORMAL LOW (ref 3.87–5.11)
RDW: 14.9 % (ref 11.5–15.5)
WBC: 7.1 10*3/uL (ref 4.0–10.5)
nRBC: 0 % (ref 0.0–0.2)

## 2020-09-26 LAB — COMPREHENSIVE METABOLIC PANEL
ALT: 15 U/L (ref 0–44)
AST: 15 U/L (ref 15–41)
Albumin: 4.2 g/dL (ref 3.5–5.0)
Alkaline Phosphatase: 55 U/L (ref 38–126)
Anion gap: 9 (ref 5–15)
BUN: 37 mg/dL — ABNORMAL HIGH (ref 8–23)
CO2: 27 mmol/L (ref 22–32)
Calcium: 9.3 mg/dL (ref 8.9–10.3)
Chloride: 103 mmol/L (ref 98–111)
Creatinine, Ser: 1.48 mg/dL — ABNORMAL HIGH (ref 0.44–1.00)
GFR, Estimated: 40 mL/min — ABNORMAL LOW (ref >60)
Glucose, Bld: 95 mg/dL (ref 70–99)
Potassium: 4 mmol/L (ref 3.5–5.1)
Sodium: 139 mmol/L (ref 135–145)
Total Bilirubin: 0.5 mg/dL (ref 0.3–1.2)
Total Protein: 7.3 g/dL (ref 6.5–8.1)

## 2020-09-26 NOTE — Telephone Encounter (Signed)
Re:  Blood counts and renal function  Today I spoke with the patient regarding her blood counts today.  Hemoglobin is stable.  She has been on prednisone 5 mg a day for the past 10 days.   She will decrease her prednisone to 2.5 mg a day.  We discussed her renal function.  Creatinine remains elevated with a creatinine clearance < 50 ml/minute.  She will remain on entecavir every other day. I encouraged her to drink fluids secondary to her elevated BUN.  Symptomatically, she is doing well.  She will follow-up in clinic on 10/07/2020.   Lequita Asal, MD

## 2020-10-03 NOTE — Progress Notes (Incomplete)
[facility]  [address], [address] [place], [place] [address] Phone: [phone]  Fax: [phone]   Clinic Day:  10/03/2020  Referring physician: Frazier Richards, MD  Chief Complaint: Tina Page is a 65 y.o. 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  HPI: The patient was last seen in the hematology clinic on 08/26/2020. At that time, she felt "ok." Stools had decreased from 3-4 bowel movements/day to 1/day.  Facial rash was stable. Hematocrit was 33.9, hemoglobin 10.9, MCV 100.0, platelets 131,000, WBC 6,000. BUN was 29. Creatinine was 1.04. LDH was 234. She was to decrease prednisone to 7.5 mg and folic acid to 1 mg per week. She continued entecavir 0.5 mg daily. She was on vancomycin; C diff came back negative.  The patient saw Dr. Posey Pronto on 09/05/2020. He was concerned that she has an undifferentiated connective tissue disease that was causing the rash on her face. Work-up was negative. Discussed that she may benefit from Plaquenil. Follow-up was planned for 6 weeks.  The patient saw Dr. Holley Raring on 09/26/2020. Her chronic kidney disease appeared to be stable. Follow-up was planned for 4 months.  Labs followed: 09/05/2020: Hematocrit 34.3, hemoglobin 11.1, MCV 99.1, platelets 140,000, WBC 6,600. Creatinine 1.14 (CrCl 54 ml/min). 09/19/2020: Hematocrit 35.1, hemoglobin 11.5, MCV 97.5, platelets 160,000, WBC 7,500. Creatinine 1.39 (CrCl 43 ml/min). BUN 32. 09/26/2020: Hematocrit 35.6, hemoglobin 11.3, MCV 97.5, platelets 166,000, WBC 7,100. Creatinine 1.48 (CrCl 40 ml/min). BUN 37.  She was called to decrease prednisone to 5 mg on 09/16/2020 and 2.5 mg on 09/26/2020.  During the interim, ***   Past Medical History:  Diagnosis Date  . Anemia   . Atherosclerosis of native arteries of extremity with intermittent  claudication ([place]) 07/20/2016  . COPD (chronic obstructive pulmonary disease) ([place])   . 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 Elevated liver function tests 11/03/2017  . GERD (gastroesophageal reflux disease)   . Heme positive stool 01/29/2015  . Hepatitis C 07/12/2016  . HLD (hyperlipidemia)   . Hypertension   . Hypothyroidism   . 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 Thrombocytopenia ([place]) 10/04/2016    Past Surgical History:  Procedure Laterality Date  . ELECTROMAGNETIC NAVIGATION BROCHOSCOPY N/A 12/07/2017   Procedure: ELECTROMAGNETIC NAVIGATION BRONCHOSCOPY;  Surgeon: Flora Lipps, MD;  Location: [facility] ORS;  Service: Cardiopulmonary;  Laterality: N/A;  . ESOPHAGOGASTRODUODENOSCOPY (EGD) WITH PROPOFOL N/A 07/08/2016   Procedure: ESOPHAGOGASTRODUODENOSCOPY (EGD) WITH PROPOFOL;  Surgeon: Manya Silvas, MD;  Location: [facility] ENDOSCOPY;  Service: Endoscopy;  Laterality: N/A;  . KNEE SURGERY Right    repair of acl tear  . PERIPHERAL VASCULAR CATHETERIZATION N/A 07/10/2016   Procedure: Lower Extremity Angiography;  Surgeon: Katha Cabal, MD;  Location: [place] CV LAB;  Service: Cardiovascular;  Laterality: N/A;  . PERIPHERAL VASCULAR CATHETERIZATION N/A 07/10/2016   Procedure: Abdominal Aortogram w/Lower Extremity;  Surgeon: Katha Cabal, MD;  Location: [place] CV LAB;  Service: Cardiovascular;  Laterality: N/A;  . PERIPHERAL VASCULAR CATHETERIZATION  07/10/2016   Procedure: Lower Extremity Intervention;  Surgeon: Katha Cabal, MD;  Location: [place] CV LAB;  Service: Cardiovascular;;    Family History  Problem Relation Age of Onset  . Diabetes Mother   . Hypertension Mother   . Diabetes Maternal Grandfather   . Hypertension Maternal Grandfather   .  Breast cancer Neg Hx     Social History:  reports that she quit smoking about 4 years ago. Her smoking use included cigarettes. She has a 58.75 pack-year smoking  history. She has never used smokeless tobacco. She reports that she does not drink alcohol and does not use drugs. She has a 21 pack year smoking history (1/2 pack/day from age [age]). The patient works at [employer] She is exposed to cold temperatures. She works 3rd shift. She has a daughter, [name] and a daughter named [name]. She lives in [place]. The patient is alone*** today.  Allergies:  Allergies  Allergen Reactions  . Ciprofloxacin Swelling    Facial swelling following single oral dose.   . Codeine Anaphylaxis  . Nsaids Other (See Comments)    PT HAS [facility]    Current Medications: Current Outpatient Medications  Medication Sig Dispense Refill  . albuterol (PROVENTIL HFA;VENTOLIN HFA) 108 (90 Base) MCG/ACT inhaler Inhale 2 puffs into the lungs every 6 (six) hours as needed for wheezing or shortness of breath.     . allopurinol (ZYLOPRIM) 100 MG tablet TAKE 1 TABLET BY MOUTH EVERY DAY (Patient taking differently: Take 100 mg by mouth daily.) 30 tablet 5  . clopidogrel (PLAVIX) 75 MG tablet Take 1 tablet (75 mg total) by mouth daily. 30 tablet 5  . cyanocobalamin 500 MCG tablet Take 500 mcg by mouth daily.    . diclofenac Sodium (VOLTAREN) 1 % GEL Apply 2 g topically 4 (four) times daily. 100 g 2  . entecavir (BARACLUDE) 0.5 MG tablet Take by mouth.    . folic acid (FOLVITE) 1 MG tablet TAKE 1 TABLET (1 MG TOTAL) BY MOUTH DAILY. 90 tablet 1  . hydrochlorothiazide (HYDRODIURIL) 25 MG tablet Take 25 mg by mouth daily.    [name] Kitchen levothyroxine (SYNTHROID, LEVOTHROID) 75 MCG tablet Take 75 mcg by mouth daily before breakfast.     . lisinopril (PRINIVIL,ZESTRIL) 20 MG tablet Take 20 mg by mouth daily.     . mupirocin ointment (BACTROBAN) 2 % Apply topically 2 (two) times daily. (Patient not taking: No sig reported) 22 g 0  . omeprazole (PRILOSEC) 20 MG capsule Take 1 capsule (20 mg total) by mouth daily. 60 capsule 0  . potassium chloride (KLOR-CON) 20 MEQ packet Take 20  mEq by mouth daily. (Patient not taking: No sig reported)    . pravastatin (PRAVACHOL) 40 MG tablet Take 40 mg by mouth every evening.    . predniSONE (DELTASONE) 2.5 MG tablet Take 3 tablets (7.5 mg total) by mouth daily with breakfast. Taper as directed by MD. 100 tablet 0  . sulfamethoxazole-trimethoprim (BACTRIM DS) 800-160 MG tablet Take 1 tablet by mouth 3 (three) times a week. Take on Mondays, Wednesdays, Fridays 20 tablet 1  . Tiotropium Bromide Monohydrate (SPIRIVA RESPIMAT) 2.5 MCG/ACT AERS Inhale 2 puffs into the lungs daily for 1 day. 1 each 0  . Tiotropium Bromide-Olodaterol (STIOLTO RESPIMAT) 2.5-2.5 MCG/ACT AERS Inhale 2 puffs into the lungs daily.     No current facility-administered medications for this visit.    Review of Systems  Constitutional: Negative for chills, diaphoresis, fever, malaise/fatigue and weight loss (up 6 lbs).       Feels "ok."  HENT: Negative for congestion, ear discharge, ear pain, hearing loss, nosebleeds, sinus pain, sore throat and tinnitus.   Eyes: Negative for blurred vision.  Respiratory: Negative for cough, hemoptysis, sputum production and shortness of breath.   Cardiovascular: Negative for chest pain, palpitations and leg  swelling.  Gastrointestinal: Positive for diarrhea (improved to once daily). Negative for abdominal pain, blood in stool, constipation, heartburn, melena, nausea and vomiting.  Genitourinary: Negative for dysuria, frequency, hematuria and urgency.  Musculoskeletal: Negative for back pain, joint pain, myalgias and neck pain.  Skin: Positive for rash (splotches on face). Negative for itching.  Neurological: Negative for dizziness, tingling, sensory change, weakness and headaches.  Endo/Heme/Allergies: Does not bruise/bleed easily.  Psychiatric/Behavioral: Negative for depression and memory loss. The patient is not nervous/anxious and does not have insomnia.   All other systems reviewed and are negative.  Performance status  (ECOG): 1***  Vital Signs There were no vitals taken for this visit.  Physical Exam Vitals and nursing note reviewed.  Constitutional:      General: She is not in acute distress.    Appearance: She is not diaphoretic.  HENT:     Head: Normocephalic and atraumatic.     Comments: Long gray hair.    Mouth/Throat:     Mouth: Mucous membranes are moist.     Pharynx: Oropharynx is clear. No oropharyngeal exudate or posterior oropharyngeal erythema.  Eyes:     General: No scleral icterus.    Extraocular Movements: Extraocular movements intact.     Conjunctiva/sclera: Conjunctivae normal.     Pupils: Pupils are equal, round, and reactive to light.     Comments: Dark rimmed glasses.  Cardiovascular:     Rate and Rhythm: Normal rate and regular rhythm.     Heart sounds: Normal heart sounds. No murmur heard.   Pulmonary:     Effort: Pulmonary effort is normal. No respiratory distress.     Breath sounds: Normal breath sounds. No wheezing or rales.  Chest:     Chest wall: No tenderness.  Breasts:     Right: No axillary adenopathy or supraclavicular adenopathy.     Left: No axillary adenopathy or supraclavicular adenopathy.    Abdominal:     General: Bowel sounds are normal. There is no distension.     Palpations: Abdomen is soft. There is no hepatomegaly, splenomegaly or mass.     Tenderness: There is no abdominal tenderness. There is no guarding or rebound.  Musculoskeletal:        General: No swelling or tenderness. Normal range of motion.     Cervical back: Normal range of motion and neck supple.  Lymphadenopathy:     Head:     Right side of head: No preauricular, posterior auricular or occipital adenopathy.     Left side of head: No preauricular, posterior auricular or occipital adenopathy.     Cervical: No cervical adenopathy.     Upper Body:     Right upper body: No supraclavicular or axillary adenopathy.     Left upper body: No supraclavicular or axillary adenopathy.      Lower Body: No right inguinal adenopathy. No left inguinal adenopathy.  Skin:    General: Skin is warm and dry.     Coloration: Skin is not jaundiced.  Neurological:     Mental Status: She is alert and oriented to person, place, and time.  Psychiatric:        Behavior: Behavior normal.        Thought Content: Thought content normal.        Judgment: Judgment normal.      08/05/2020    Imaging studies: 07/07/2016:  Chest, abdomen, and pelvis CTangiogram revealed moderate diffuse atherosclerotic vascular disease of the abdominal aorta with severe stenosis of the  left common iliac artery with suspected short segment occlusion. There was no adenopathy. Spleen was normal.  04/15/2017:  Abdominal ultrasoundrevealed a normal spleen and sludge in the gallbladder. The liver was echogenic consistent fatty infiltration and/or hepatocellular disease. 11/10/2017:  Abdomen and pelvic CTrevealed a 2.2 cm spiculated density in right middle lobe concerning for malignancy.  11/16/2017:  Chest CTon 11/16/2017 revealed a 2.2 x 2.0 x 2.4 spiculated RIGHT middle lobe mass. There was tiny nonspecific upper lobe pulmonary nodules greater on RIGHT, largest 3 mm, of uncertain etiology. There was additional 8 x 8 mm opacity in the posterior sulcus of the RIGHT lower lobe. 11/22/2017:  PET scanrevealed a 2 cm hypermetabolic right middle lobe lung mass (SUV 3.4) consistent with primary lung neoplasm. There were no findings for mediastinal/hilar lymphadenopathy or metastatic disease. There were areas of hypermetabolism involving the left oblique abdominal muscles and the anorectal junction. 02/06/2018:  Chest CT revealed the spiculated 2.2 cm right middle lobe lesion was almost completely resolved with only a residual 4 mm irregular nodule identified at this location on a background of architectural distortion/evolving scar. There was a 5 mm right parahilar nodule stable since 11/16/2017(not present on  07/07/2016). There were tiny nodules in both upper lobes suggesting inhalation etiology. 06/27/2019:  Chest CT revealed the nodule of the right middle lobe had almost completely resolved, with residual linear scarring. There was persistent paramedian consolidation of the medial right middle lobe and lingula, generally in keeping with atypical infection, particularly atypical mycobacterium. There had been interval increase in size of a small nodule of the right middle lobe, now measuring 4 mm (previously 2 mm).   There was new 6 mm ground-glass opacity of the lateral left upper lobe measuring.  Although nonspecific, these were likely infectious or inflammatory in nature.  Attention on follow-up was receommended. There was unchanged tiny biapical pulmonary nodules, benign sequelae of nonspecific infection or inflammation, possibly related cigarette smoking, atypical infection, or other inhalational, inflammatory lung disease. There was hepatic steatosis. 12/26/2019:  Chest CT on 12/26/2019 revealed stable sub-cm bilateral pulmonary nodules, consistent with benign etiology. There was stable paramedian consolidation in the right middle lobe and lingula. There was a new airspace disease in inferior right middle lobe, with a few adjacent subcentimeter nodules. Overall, above findings were consistent with waxing and waning atypical infectious process, such as MAI.   07/24/2020:  Low dose chest CT revealed Lung-RADS 2, benign appearance or behavior. There was emphysema, aortic atherosclerosis, and coronary artery calcifications.   No visits with results within 3 Day(s) from this visit.  Latest known visit with results is:  Appointment on 09/26/2020  Component Date Value Ref Range Status  . Sodium 09/26/2020 139  135 - 145 mmol/L Final  . Potassium 09/26/2020 4.0  3.5 - 5.1 mmol/L Final  . Chloride 09/26/2020 103  98 - 111 mmol/L Final  . CO2 09/26/2020 27  22 - 32 mmol/L Final  . Glucose, Bld 09/26/2020 95   70 - 99 mg/dL Final   Glucose reference range applies only to samples taken after fasting for at least 8 hours.  . BUN 09/26/2020 37* 8 - 23 mg/dL Final  . Creatinine, Ser 09/26/2020 1.48* 0.44 - 1.00 mg/dL Final  . Calcium 09/26/2020 9.3  8.9 - 10.3 mg/dL Final  . Total Protein 09/26/2020 7.3  6.5 - 8.1 g/dL Final  . Albumin 09/26/2020 4.2  3.5 - 5.0 g/dL Final  . AST 09/26/2020 15  15 - 41 U/L Final  .  ALT 09/26/2020 15  0 - 44 U/L Final  . Alkaline Phosphatase 09/26/2020 55  38 - 126 U/L Final  . Total Bilirubin 09/26/2020 0.5  0.3 - 1.2 mg/dL Final  . GFR, Estimated 09/26/2020 40* >60 mL/min Final   Comment: (NOTE) Calculated using the CKD-EPI Creatinine Equation (2021)   . Anion gap 09/26/2020 9  5 - 15 Final   Performed at [facility], [address], [place], [place] [address]  . WBC 09/26/2020 7.1  4.0 - 10.5 K/uL Final  . RBC 09/26/2020 3.65* 3.87 - 5.11 MIL/uL Final  . Hemoglobin 09/26/2020 11.3* 12.0 - 15.0 g/dL Final  . HCT 09/26/2020 35.6* 36.0 - 46.0 % Final  . MCV 09/26/2020 97.5  80.0 - 100.0 fL Final  . MCH 09/26/2020 31.0  26.0 - 34.0 pg Final  . MCHC 09/26/2020 31.7  30.0 - 36.0 g/dL Final  . RDW 09/26/2020 14.9  11.5 - 15.5 % Final  . Platelets 09/26/2020 166  150 - 400 K/uL Final  . nRBC 09/26/2020 0.0  0.0 - 0.2 % Final  . Neutrophils Relative % 09/26/2020 68  % Final  . Neutro Abs 09/26/2020 4.9  1.7 - 7.7 K/uL Final  . Lymphocytes Relative 09/26/2020 21  % Final  . Lymphs Abs 09/26/2020 1.5  0.7 - 4.0 K/uL Final  . Monocytes Relative 09/26/2020 7  % Final  . Monocytes Absolute 09/26/2020 0.5  0.1 - 1.0 K/uL Final  . Eosinophils Relative 09/26/2020 2  % Final  . Eosinophils Absolute 09/26/2020 0.1  0.0 - 0.5 K/uL Final  . Basophils Relative 09/26/2020 1  % Final  . Basophils Absolute 09/26/2020 0.1  0.0 - 0.1 K/uL Final  . Immature Granulocytes 09/26/2020 1  % Final  . Abs Immature Granulocytes 09/26/2020 0.04  0.00 - 0.07 K/uL Final    Performed at [facility], [address], [place], [place] [address]    Assessment:  Tina Page is a 65 y.o. 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 One week prior to presentation, she felt like she was coming down with something. She denied any fever, runny nose, sore throat or cough. She had some diarrhea. She denied any new medications or herbal products.  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 Reticulocyte count was 11.3%. Ferritin was 562. Iron studies revealed a saturation of 20% and a TIBC of 214 (low). B12 was 231 (low normal). Folate was 42. Peripheral smear revealed rouleaux formation. Labs on 06/21/2019revealed normal flow cytometry, SPEP, and immunoglobulins.  Additional testingincluded the following+ studies: hepatitis C antibody,hepatitis B core antibody,CMV IgM, and EBV VCA (IgM and IgG). Hepatitis B by PCRwas negative. Hepatitis C RNAwas negative. Mycoplasma pneumonia IgM was negative. Reticulocyte count was 11.3% (high) indicating appropriate marrow response. C3 and C4 were normal. Cold agglutinin titer was negative x 2. Negative studies included: ANA, hepatitis B surface antigen, SPEP, and free light chain ratio. 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 MMA was initially 330 (normal). Repeat MMA was elevated on 08/01/2016 confirming B12 deficiency. Hepatitis B surface antibody was positive and hepatitis B surface antigen was negative on 08/06/2016.  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 There was no adenopathy. Spleen was normal. Abdominal ultrasoundon 04/15/2017 revealed a normal spleen and sludge  in the gallbladder. The  liver was echogenic consistent fatty infiltration and/or hepatocellular disease.  She underwent PTCA and stent placementin right and left common iliac arteries and left external iliac artery on 07/10/2016.She is on Plavix.EGD on 03/19/2015 revealed gastritis in the body and antrum. Colonoscopyon 03/19/2015 revealed one hyperplastic polyp. EGDon 07/08/2016 was normal. No evidence of bleeding.  She has received6 units of warmed PRBCsto date (last on 08/01/2016).If Rituxan is needed, she requires entecavir 0.5 mg q day beginning 2 weeks prior to Rituxan. In addition, hepatitis B viral level and LFTs should be checked monthly.  She began steroids(1 mg/kg) on 08/01/2016. She stopped prednisoneon 03/12/2017. She is onfolic acid1 mg a day.   She hasB12 deficiency. B12 was 231 on 07/09/2016. She is on oral B12. B12 and folate were normal on 03/09/2017 and 10/12/2017.  She developed peri-oral and intranasalherpes simplex-1. She was treated with valacyclovir and doxycycline on 10/19/2016. She developed a transient elevated alkaline phosphataseon 10/19/2016 which subsequently normalized.  She was documented to have awarm autoantibodyon 10/12/2017. LDH and bilirubin were normal. Hematocrit improved. She began prednisone10 mg on 12/07/2017 (discontinued 03/2018).She received Rituxanweekly x 4 (last dose 06/16/2018).  Entecavir was discontinued on 07/06/2019.  She developedflu like symptomson 10/26/2017. Symptoms included cough, myalgias, and fever (tmax unknown). She was prescribed Mucinex, Tamiflu, and amoxicillin. She only took amoxicillin x 5 days. She developedincreased liver function testson 11/02/2017. CMV IgG was positive. EBV VCA IgG, NA IgG, early antigen antibody IgG were elevated. EBV VCA IgM was <36. Testing was c/w a convalescence/past infection or reactivated infection. LFTs normalized on 11/15/2017. Smooth muscle antibodywas 39 (high) on  11/10/2017 and 34 (high) on 11/30/2017.   Labs on 06/12/2020 revealed recurrent anemia.  Hematocrit was 27.6, hemoglobin 8.4, MCV 104.5, platelets 138,000, WBC 3,400 (ANC 2,300). Creatinine was 1.17 (CrCl 50 ml/min).  Normal labs included ferritin (434), iron studies, B12 (885), folate (> 100).  [place]oombs polyspecific AGH test was positive (warm autoantibody).  Reticulocyte count was 7.1%.  LDH was 204 (98-192).  Peripheral smear revealed macrocytic anemia with mild thrombocytopenia. The morphology of RBCs, WBCs, and platelets were within normal limits. There was no evidence of circulating blasts or schistocytes.  She began prednisone 1 mg/kg (80 mg) on 06/16/2020.  Prednisone was decreased to 20 mg a day on 111/24/2021.  She received Rituxan 1000 mg on day 1 and 15 (07/15/2020 and 07/29/2020).  She began entecavir on 06/28/2020.  She is on Septra for PCP prophylaxis.   Chest CTon 11/16/2017 revealed a 2.2 x 2.0 x 2.4 spiculated RIGHT middle lobe mass. There was tiny nonspecific upper lobe pulmonary nodules greater on RIGHT, largest 3 mm, of uncertain etiology. There was additional 8 x 8 mm opacity in the posterior sulcus of the RIGHT lower lobe. PET scanon 11/22/2017 revealed a 2 cm hypermetabolic right middle lobe lung mass (SUV 3.4) consistent with primary lung neoplasm. There were no findings for mediastinal/hilar lymphadenopathy or metastatic disease. There were areas of hypermetabolism involving the left oblique abdominal muscles and the anorectal junction.  PFTs on 11/18/2017 revealed an FEV1 of 1.22 liters (51%). DLCO adj was 6.8 mL/mmHg/min (32%).  ENB done on 12/07/2017. Cytology was negative for malignancy. Pathologydemonstrated organizing pneumonia and chronic bronchitis. Pathologist commented that organizing pneumonia spanned about 2 mm in one fragment. Cases of focal organizing pneumonia with hypermetabolism on FDG-PET have been described, but changes of this type can also be  adjacent to a neoplasm. Patient prescribed a daily dose of Prednisone 10 mg, with  re-imaging planned in 6-8 weeks.   Low dose chest CT on 07/24/2020 revealed Lung-RADS 2, benign appearance or behavior. There was emphysema, aortic atherosclerosis, and coronary artery calcifications.   She has diarrhea. 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 She received azithromycin x 3 days. She received ciprofloxacin x 10 days without improvement. She has seen ID in [place]. She began Bactrim on 03/04/2018 (discontinued on 03/11/2018) secondary to increase in creatine.  She has renal insufficiency. Creatininehas been followed: 1.17 on 11/05/2017, 1.85 on 11/09/2017, 1.38 on 11/12/2017, 1.74 on 11/15/2017, 1.66 on 11/17/2017, and 1.48 on 11/23/2017. Urinalysis on 11/15/2017 revealed revealed no hemoglobin, bilirubin or active sediment.  Liver function tests increased on 02/15/2019.  Work-up on 02/16/2019 revealed hepatitis B E antibody positive.  Negative studies included: hepatitis A total antibody, hepatitis A IgM, hepatitis B surface antigen, hepatitis B E antigen.  Hepatitis B DNA was not detected.  Folate was 80.5 and vitamin B12 was 443.  She has received her vaccinations:  She has completed the PCV13, PPSV23, and HiB.  She received Menvio (quadrivalent meningoccal vaccine) and Bexero (univalent serogroup B meningoccal vaccine) on 07/06/2019.  She was admitted to [facility] from 07/16/2020 - 07/18/2020 with a fever and UTI.  Blood and urine culture grew E coli.  She received ceftriaxone and was discharged on Septra DS 1 tablet BID.  Septra was switched to Keflex secondary to renal insufficiency.  She was diagnosed with C difficile + diarrhea on 08/22/2020.  She started oral vancomycin late as her pharmacy did not carry it.  Bowel movements have dramatically improved.  She notes that she tested positive for COVID-19 on 08/21/2019.  She received the Moderna COVID-19  vaccine on 11/10/2019 and 12/08/2019.   Symptomatically,  ***  Plan: 1.   Labs today: CBC with diff, CMP, LDH   2.Warm autoimmune hemolytic anemia Clinically, she is doing well.       She active warm autoimmune hemolytic anemia responding well to Rituxan and a steroid taper. Hematocrit 27.6.  Hemoglobin   8.4.  LDH 204 (98-192) on 06/12/2020.                   Work-up confirmed warm autoantibodies.       Hematocrit 30.4.  Hemoglobin   9.2.  LDH 164 (98-192) on 06/19/2020.                   She began prednisone 1 mg/kg (80 mg) on 06/16/2020.       Hematocrit 33.9.  Hemoglobin 10.9.  LDH 234 on 08/26/2020.       She is s/p day 1 and 15 Rituxan (last 07/29/2020).       She is on prednisone 10 mg a day mg.       Discuss plan for ongoing slow taper off prednisone.  Decrease prednisone to 7.5 mg a day.  Continue to monitor every 10 da7s with taper of steroids. 3.Hepatitis B Patient was previously exposed to hepatitis B. She was on entecavir x 1 year when she previously received Rituxan (stopped 07/06/2019). Patient did not complete her meningococcal vaccines.       She began entecavir 0.5 mg daily on 06/28/2020.                   Dose adjusted based on renal function.                   CrCl 30-49 ml/min   50% dosing (0.5  mg QOD).                   CrCl >= 50 ml/min 100% dosing (0.5 mg a day).  Creatinine 1.04 (CrCl 52.1 ml/min).  Continue entecavir 0.5 mg a day.       Continue to monitor creatinine and adjust entecavir dose accordingly. 4.B12 deficiency B12 was 443 on 02/15/2019 and 885 on 06/12/2020.             Folate was 80.5 on 02/15/2019 and >100 on 06/12/2020.                         Folate was 92.0 on 08/19/2020.             She is currently on folic acid 1 mg 3 days a week (Mondays, Wednesdays and Fridays).  Decrease folic acid to 1 mg a week. 5. Right middle lobe nodule             Chest CT on 12/26/2019 revealed stable  sub-cm bilateral pulmonary nodules c/w benign etiology.                          There was a new airspace disease in inferior right middle lobe, with a few adjacent subcentimeter nodules.                          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             Low-dose chest CT on 07/24/2020 revealedLung-RADS 2, benign appearance or behavior.             Patient is being followed by Dr Mortimer Fries in pulmonary medicine.   6.   Facial rash             Etiology remains unclear.             Etiology is not felt secondary to Rituxan.             Patient has not been seen by rheumatology yet.  Consult dermatology.   7.   PCP prophylaxis             Patient on Septra DA on Mondays, Wednesdays, and Fridays.  Monitor creatinine given past history of increase creatinine with full dose Septra (BID). 8.   C difficile + diarrhea  Patient diagnosed on 08/12/2020.  She has not completed oral vancomycin secondary to delay in obtaining medication.  Diarrhea has dramatically improved.  Retest stool after completion of vancomycin.  9.   RN:  Provide patient with cup for C difficile testing. 10.   RTC every 10 days x 4 for labs (CBC with diff, BMP). 11.   RTC in 40 days for MD assessment, labs (CBC with diff, CMP, LDH), and ongoing prednisone taper.  I discussed the assessment and treatment plan with the patient.  The patient was provided an opportunity to ask questions and all were answered.  The patient agreed with the plan and demonstrated an understanding of the instructions.  The patient was advised to call back or seek an in person evaluation if the symptoms worsen or if the condition fails to improve as anticipated.  I provided *** minutes of face-to-face time during this this encounter and > 50% was spent counseling as documented under my assessment and plan.  Nolon Stalls, MD, PhD  10/03/2020, 9:43 AM  I, Mirian Mo Tufford, am acting as Education administrator for [employer].  Mike Gip, MD, PhD.  I, Melissa C. Mike Gip, MD, have reviewed the above documentation for accuracy and completeness, and I agree with the above.

## 2020-10-07 ENCOUNTER — Inpatient Hospital Stay: Payer: BC Managed Care – PPO

## 2020-10-07 ENCOUNTER — Other Ambulatory Visit: Payer: Self-pay | Admitting: *Deleted

## 2020-10-07 ENCOUNTER — Other Ambulatory Visit: Payer: Self-pay

## 2020-10-07 ENCOUNTER — Inpatient Hospital Stay: Payer: BC Managed Care – PPO | Admitting: Hematology and Oncology

## 2020-10-07 DIAGNOSIS — D591 Autoimmune hemolytic anemia, unspecified: Secondary | ICD-10-CM

## 2020-10-07 DIAGNOSIS — D696 Thrombocytopenia, unspecified: Secondary | ICD-10-CM

## 2020-10-07 NOTE — Progress Notes (Unsigned)
cmp

## 2020-10-09 NOTE — Progress Notes (Signed)
[facility]  [address], [address] [place], [place] [address] Phone: [phone]  Fax: [phone]   Clinic Day:  10/10/2020  Referring physician: Frazier Richards, MD  Chief Complaint: Tina Page is a 65 y.o. 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  HPI: The patient was last seen in the hematology clinic on 08/26/2020. At that time, she felt "ok." Stools had decreased from 3-4 bowel movements/day to 1/day.  Facial rash was stable. Hematocrit was 33.9, hemoglobin 10.9, MCV 100.0, platelets 131,000, WBC 6,000. Creatinine was 1.04. LDH was 234. She was to decrease prednisone to 7.5 mg/day and folic acid to 1 mg per week. She continued entecavir 0.5 mg daily. She was on vancomycin; C diff came back negative.  She saw Dr. Posey Pronto on 09/05/2020. He was concerned that she has an undifferentiated connective tissue disease that was causing the rash on her face. Work-up was negative. Discussions were held regarding a possible benefit from Plaquenil. Follow-up was planned for 6 weeks.  The patient saw Dr. Holley Raring on 09/26/2020. Her chronic kidney disease appeared to be stable. Follow-up was planned for 4 months.  Labs followed: 09/05/2020: Hematocrit 34.3, hemoglobin 11.1, MCV 99.1, platelets 140,000, WBC 6,600. Creatinine 1.14 (CrCl 54 ml/min). 09/19/2020: Hematocrit 35.1, hemoglobin 11.5, MCV 97.5, platelets 160,000, WBC 7,500. Creatinine 1.39 (CrCl 43 ml/min). 09/26/2020: Hematocrit 35.6, hemoglobin 11.3, MCV 97.5, platelets 166,000, WBC 7,100. Creatinine 1.48 (CrCl 40 ml/min).  She was called to decrease prednisone to 5 mg/day on 09/16/2020 and 2.5 mg/day on 09/26/2020.  During the interim, she has been "okay." Her diet is okay. Her urine and stool are normal in color. She started Plaquenil 4 days ago and has had no side  effects. She takes Septra three times per week and needs a refill.  Her dermatologist prescribed her a cream to use on her face. The splotches have improved.   Past Medical History:  Diagnosis Date  . Anemia   . Atherosclerosis of native arteries of extremity with intermittent claudication ([place]) 07/20/2016  . COPD (chronic obstructive pulmonary disease) ([place])   . 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 Elevated liver function tests 11/03/2017  . GERD (gastroesophageal reflux disease)   . Heme positive stool 01/29/2015  . Hepatitis C 07/12/2016  . HLD (hyperlipidemia)   . Hypertension   . Hypothyroidism   . 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 Thrombocytopenia ([place]) 10/04/2016    Past Surgical History:  Procedure Laterality Date  . ELECTROMAGNETIC NAVIGATION BROCHOSCOPY N/A 12/07/2017   Procedure: ELECTROMAGNETIC NAVIGATION BRONCHOSCOPY;  Surgeon: Flora Lipps, MD;  Location: [facility] ORS;  Service: Cardiopulmonary;  Laterality: N/A;  . ESOPHAGOGASTRODUODENOSCOPY (EGD) WITH PROPOFOL N/A 07/08/2016   Procedure: ESOPHAGOGASTRODUODENOSCOPY (EGD) WITH PROPOFOL;  Surgeon: Manya Silvas, MD;  Location: [facility] ENDOSCOPY;  Service: Endoscopy;  Laterality: N/A;  . KNEE SURGERY Right    repair of acl tear  . PERIPHERAL VASCULAR CATHETERIZATION N/A 07/10/2016   Procedure: Lower Extremity Angiography;  Surgeon: Katha Cabal, MD;  Location: [place] CV LAB;  Service: Cardiovascular;  Laterality: N/A;  . PERIPHERAL VASCULAR CATHETERIZATION N/A 07/10/2016   Procedure: Abdominal Aortogram w/Lower Extremity;  Surgeon: Katha Cabal, MD;  Location: [place] [facility] LAB;  Service: Cardiovascular;  Laterality: N/A;  . PERIPHERAL VASCULAR CATHETERIZATION  07/10/2016   Procedure: Lower Extremity Intervention;  Surgeon: Katha Cabal, MD;  Location: [place] [facility] LAB;  Service: Cardiovascular;;    Family History  Problem Relation Age of Onset  .  Diabetes Mother   . Hypertension Mother   . Diabetes Maternal Grandfather   . Hypertension Maternal Grandfather   . Breast cancer Neg Hx     Social History:  reports that she quit smoking about 4 years ago. Her smoking use included cigarettes. She has a 58.75 pack-year smoking history. She has never used smokeless tobacco. She reports that she does not drink alcohol and does not use drugs. She has a 21 pack year smoking history (1/2 pack/day from age [age]). The patient works at [employer] She is exposed to cold temperatures. She works 3rd shift. She has a daughter, Tina Page and a daughter named Tina Page. She lives in [place]. The patient is alone today.  Allergies:  Allergies  Allergen Reactions  . Ciprofloxacin Swelling    Facial swelling following single oral dose.   . Codeine Anaphylaxis  . Nsaids Other (See Comments)    PT HAS [facility]    Current Medications: Current Outpatient Medications  Medication Sig Dispense Refill  . albuterol (PROVENTIL HFA;VENTOLIN HFA) 108 (90 Base) MCG/ACT inhaler Inhale 2 puffs into the lungs every 6 (six) hours as needed for wheezing or shortness of breath.     . allopurinol (ZYLOPRIM) 100 MG tablet TAKE 1 TABLET BY MOUTH EVERY DAY (Patient taking differently: Take 100 mg by mouth daily.) 30 tablet 5  . clopidogrel (PLAVIX) 75 MG tablet Take 1 tablet (75 mg total) by mouth daily. 30 tablet 5  . cyanocobalamin 500 MCG tablet Take 500 mcg by mouth daily.    . diclofenac Sodium (VOLTAREN) 1 % GEL Apply 2 g topically 4 (four) times daily. 100 g 2  . entecavir (BARACLUDE) 0.5 MG tablet Take by mouth.    . folic acid (FOLVITE) 1 MG tablet TAKE 1 TABLET (1 MG TOTAL) BY MOUTH DAILY. 90 tablet 1  . hydrochlorothiazide (HYDRODIURIL) 25 MG tablet Take 25 mg by mouth daily.    [name] Kitchen levothyroxine (SYNTHROID, LEVOTHROID) 75 MCG tablet Take 75 mcg by mouth daily before breakfast.     . lisinopril (PRINIVIL,ZESTRIL) 20 MG tablet Take 20 mg by mouth  daily.     [name] Kitchen omeprazole (PRILOSEC) 20 MG capsule Take 1 capsule (20 mg total) by mouth daily. 60 capsule 0  . potassium chloride (KLOR-CON) 20 MEQ packet Take 20 mEq by mouth daily.    . pravastatin (PRAVACHOL) 40 MG tablet Take 40 mg by mouth every evening.    . predniSONE (DELTASONE) 2.5 MG tablet Take 3 tablets (7.5 mg total) by mouth daily with breakfast. Taper as directed by MD. (Patient taking differently: Take 2.5 mg by mouth daily with breakfast. Taper as directed by MD.) 100 tablet 0  . Tiotropium Bromide Monohydrate (SPIRIVA RESPIMAT) 2.5 MCG/ACT AERS Inhale 2 puffs into the lungs daily for 1 day. 1 each 0  . mupirocin ointment (BACTROBAN) 2 % Apply topically 2 (two) times daily. (Patient not taking: No sig reported) 22 g 0  . [START ON 10/11/2020] sulfamethoxazole-trimethoprim (BACTRIM DS) 800-160 MG tablet Take 1 tablet by mouth 3 (three) times a week. Take on Mondays, Wednesdays, Fridays 20 tablet 1  . Tiotropium Bromide-Olodaterol (STIOLTO RESPIMAT) 2.5-2.5 MCG/ACT AERS Inhale 2 puffs into the lungs daily. (Patient not taking: Reported on 10/10/2020)     No current facility-administered medications for this visit.    Review of Systems  Constitutional: Negative for chills, diaphoresis, fever, malaise/fatigue and weight loss (up 4 lbs).  HENT: Negative for congestion, ear discharge, ear pain, hearing loss, nosebleeds, sinus pain, sore throat and tinnitus.   Eyes: Negative for blurred vision.  Respiratory: Negative for cough, hemoptysis, sputum production and shortness of breath.   Cardiovascular: Negative for chest pain, palpitations and leg swelling.  Gastrointestinal: Negative for abdominal pain, blood in stool, constipation, diarrhea, heartburn, melena, nausea and vomiting.  Genitourinary: Negative for dysuria, frequency, hematuria and urgency.  Musculoskeletal: Negative for back pain, joint pain, myalgias and neck pain.  Skin: Positive for rash (splotches on face, improved).  Negative for itching.  Neurological: Negative for dizziness, tingling, sensory change, weakness and headaches.  Endo/Heme/Allergies: Does not bruise/bleed easily.  Psychiatric/Behavioral: Negative for depression and memory loss. The patient is not nervous/anxious and does not have insomnia.   All other systems reviewed and are negative.  Performance status (ECOG): 0  Vital Signs Blood pressure (!) 121/53, pulse 76, temperature (!) 96 F (35.6 C), temperature source Tympanic, resp. rate 16, weight 186 lb 8.2 oz (84.6 kg), SpO2 99 %.  Physical Exam Vitals and nursing note reviewed.  Constitutional:      General: She is not in acute distress.    Appearance: She is not diaphoretic.  HENT:     Head: Normocephalic and atraumatic.     Comments: Long gray hair.    Mouth/Throat:     Mouth: Mucous membranes are moist.     Pharynx: Oropharynx is clear.  Eyes:     General: No scleral icterus.    Extraocular Movements: Extraocular movements intact.     Conjunctiva/sclera: Conjunctivae normal.     Pupils: Pupils are equal, round, and reactive to light.     Comments: Dark rimmed glasses  Cardiovascular:     Rate and Rhythm: Normal rate and regular rhythm.     Heart sounds: Normal heart sounds. No murmur heard.   Pulmonary:     Effort: Pulmonary effort is normal. No respiratory distress.     Breath sounds: Normal breath sounds. No wheezing or rales.  Chest:     Chest wall: No tenderness.  Breasts:     Right: No axillary adenopathy or supraclavicular adenopathy.     Left: No axillary adenopathy or supraclavicular adenopathy.    Abdominal:     General: Bowel sounds are normal. There is no distension.     Palpations: Abdomen is soft. There is no mass.     Tenderness: There is no abdominal tenderness. There is no guarding or rebound.  Musculoskeletal:        General: No swelling or tenderness. Normal range of motion.     Cervical back: Normal range of motion and neck supple.   Lymphadenopathy:     Head:     Right side of head: No preauricular, posterior auricular or occipital adenopathy.     Left side of head: No preauricular, posterior auricular or occipital adenopathy.     Cervical: No cervical adenopathy.     Upper Body:     Right upper body: No supraclavicular or axillary adenopathy.     Left upper body: No supraclavicular or axillary adenopathy.     Lower Body: No right inguinal adenopathy. No left inguinal adenopathy.  Skin:    General: Skin is warm and dry.  Neurological:     Mental Status: She is alert and oriented to person, place, and time.  Psychiatric:        Behavior: Behavior  normal.        Thought Content: Thought content normal.        Judgment: Judgment normal.      08/05/2020    Imaging studies: 07/07/2016:  Chest, abdomen, and pelvis CTangiogram revealed moderate diffuse atherosclerotic vascular disease of the abdominal aorta with severe stenosis of the left common iliac artery with suspected short segment occlusion. There was no adenopathy. Spleen was normal.  04/15/2017:  Abdominal ultrasoundrevealed a normal spleen and sludge in the gallbladder. The liver was echogenic consistent fatty infiltration and/or hepatocellular disease. 11/10/2017:  Abdomen and pelvic CTrevealed a 2.2 cm spiculated density in right middle lobe concerning for malignancy.  11/16/2017:  Chest CTon 11/16/2017 revealed a 2.2 x 2.0 x 2.4 spiculated RIGHT middle lobe mass. There was tiny nonspecific upper lobe pulmonary nodules greater on RIGHT, largest 3 mm, of uncertain etiology. There was additional 8 x 8 mm opacity in the posterior sulcus of the RIGHT lower lobe. 11/22/2017:  PET scanrevealed a 2 cm hypermetabolic right middle lobe lung mass (SUV 3.4) consistent with primary lung neoplasm. There were no findings for mediastinal/hilar lymphadenopathy or metastatic disease. There were areas of hypermetabolism involving the left oblique abdominal  muscles and the anorectal junction. 02/06/2018:  Chest CT revealed the spiculated 2.2 cm right middle lobe lesion was almost completely resolved with only a residual 4 mm irregular nodule identified at this location on a background of architectural distortion/evolving scar. There was a 5 mm right parahilar nodule stable since 11/16/2017(not present on 07/07/2016). There were tiny nodules in both upper lobes suggesting inhalation etiology. 06/27/2019:  Chest CT revealed the nodule of the right middle lobe had almost completely resolved, with residual linear scarring. There was persistent paramedian consolidation of the medial right middle lobe and lingula, generally in keeping with atypical infection, particularly atypical mycobacterium. There had been interval increase in size of a small nodule of the right middle lobe, now measuring 4 mm (previously 2 mm).   There was new 6 mm ground-glass opacity of the lateral left upper lobe measuring.  Although nonspecific, these were likely infectious or inflammatory in nature.  Attention on follow-up was receommended. There was unchanged tiny biapical pulmonary nodules, benign sequelae of nonspecific infection or inflammation, possibly related cigarette smoking, atypical infection, or other inhalational, inflammatory lung disease. There was hepatic steatosis. 12/26/2019:  Chest CT on 12/26/2019 revealed stable sub-cm bilateral pulmonary nodules, consistent with benign etiology. There was stable paramedian consolidation in the right middle lobe and lingula. There was a new airspace disease in inferior right middle lobe, with a few adjacent subcentimeter nodules. Overall, above findings were consistent with waxing and waning atypical infectious process, such as MAI.   07/24/2020:  Low dose chest CT revealed Lung-RADS 2, benign appearance or behavior. There was emphysema, aortic atherosclerosis, and coronary artery calcifications.   Appointment on 10/10/2020  Component  Date Value Ref Range Status  . LDH 10/10/2020 169  98 - 192 U/L Final   Performed at [facility], [address], [place], [place] [address]  . WBC 10/10/2020 6.2  4.0 - 10.5 K/uL Final  . RBC 10/10/2020 3.40* 3.87 - 5.11 MIL/uL Final  . Hemoglobin 10/10/2020 10.6* 12.0 - 15.0 g/dL Final  . HCT 10/10/2020 32.5* 36.0 - 46.0 % Final  . MCV 10/10/2020 95.6  80.0 - 100.0 fL Final  . MCH 10/10/2020 31.2  26.0 - 34.0 pg Final  . MCHC 10/10/2020 32.6  30.0 - 36.0 g/dL Final  . RDW  10/10/2020 14.8  11.5 - 15.5 % Final  . Platelets 10/10/2020 159  150 - 400 K/uL Final  . nRBC 10/10/2020 0.0  0.0 - 0.2 % Final  . Neutrophils Relative % 10/10/2020 72  % Final  . Neutro Abs 10/10/2020 4.5  1.7 - 7.7 K/uL Final  . Lymphocytes Relative 10/10/2020 16  % Final  . Lymphs Abs 10/10/2020 1.0  0.7 - 4.0 K/uL Final  . Monocytes Relative 10/10/2020 8  % Final  . Monocytes Absolute 10/10/2020 0.5  0.1 - 1.0 K/uL Final  . Eosinophils Relative 10/10/2020 2  % Final  . Eosinophils Absolute 10/10/2020 0.1  0.0 - 0.5 K/uL Final  . Basophils Relative 10/10/2020 1  % Final  . Basophils Absolute 10/10/2020 0.1  0.0 - 0.1 K/uL Final  . Immature Granulocytes 10/10/2020 1  % Final  . Abs Immature Granulocytes 10/10/2020 0.03  0.00 - 0.07 K/uL Final   Performed at [facility], [address] [place], [place] [address]  . Sodium 10/10/2020 140  135 - 145 mmol/L Final  . Potassium 10/10/2020 4.2  3.5 - 5.1 mmol/L Final  . Chloride 10/10/2020 103  98 - 111 mmol/L Final  . CO2 10/10/2020 26  22 - 32 mmol/L Final  . Glucose, Bld 10/10/2020 130* 70 - 99 mg/dL Final   Glucose reference range applies only to samples taken after fasting for at least 8 hours.  . BUN 10/10/2020 23  8 - 23 mg/dL Final  . Creatinine, Ser 10/10/2020 1.33* 0.44 - 1.00 mg/dL Final  . Calcium 10/10/2020 9.2  8.9 - 10.3 mg/dL Final  . Total Protein 10/10/2020 6.9  6.5 - 8.1 g/dL Final  . Albumin 10/10/2020 3.9  3.5 - 5.0  g/dL Final  . AST 10/10/2020 15  15 - 41 U/L Final  . ALT 10/10/2020 13  0 - 44 U/L Final  . Alkaline Phosphatase 10/10/2020 58  38 - 126 U/L Final  . Total Bilirubin 10/10/2020 0.7  0.3 - 1.2 mg/dL Final  . GFR, Estimated 10/10/2020 45* >60 mL/min Final   Comment: (NOTE) Calculated using the CKD-EPI Creatinine Equation (2021)   . Anion gap 10/10/2020 11  5 - 15 Final   Performed at [facility], [address] [place], [place] [address]    Assessment:  GARYN WAGUESPACK is a 65 y.o. 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 One week prior to presentation, she felt like she was coming down with something. She denied any fever, runny nose, sore throat or cough. She had some diarrhea. She denied any new medications or herbal products.  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 Reticulocyte count was 11.3%. Ferritin was 562. Iron studies revealed a saturation of 20% and a TIBC of 214 (low). B12 was 231 (low normal). Folate was 42. Peripheral smear revealed rouleaux formation. Labs on 06/21/2019revealed normal flow cytometry, SPEP, and immunoglobulins.  Additional testingincluded the following+ studies: hepatitis C antibody,hepatitis B core antibody,CMV IgM, and EBV VCA (IgM and IgG). Hepatitis B by PCRwas negative. Hepatitis C RNAwas negative. Mycoplasma pneumonia IgM was negative. Reticulocyte count was 11.3% (high) indicating appropriate marrow response. C3 and C4 were normal. Cold agglutinin titer was negative x 2. Negative studies included: ANA, hepatitis B surface antigen, SPEP, and free light chain ratio. 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 MMA was initially 330 (normal). Repeat MMA was elevated on 08/01/2016 confirming B12  deficiency. Hepatitis B surface antibody was positive and hepatitis B surface antigen was negative on  08/06/2016.  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 There was no adenopathy. Spleen was normal. Abdominal ultrasoundon 04/15/2017 revealed a normal spleen and sludge in the gallbladder. The liver was echogenic consistent fatty infiltration and/or hepatocellular disease.  She underwent PTCA and stent placementin right and left common iliac arteries and left external iliac artery on 07/10/2016.She is on Plavix.EGD on 03/19/2015 revealed gastritis in the body and antrum. Colonoscopyon 03/19/2015 revealed one hyperplastic polyp. EGDon 07/08/2016 was normal. No evidence of bleeding.  She has received6 units of warmed PRBCsto date (last on 08/01/2016).If Rituxan is needed, she requires entecavir 0.5 mg q day beginning 2 weeks prior to Rituxan. In addition, hepatitis B viral level and LFTs should be checked monthly.  She began steroids(1 mg/kg) on 08/01/2016. She stopped prednisoneon 03/12/2017. She is onfolic acid1 mg a day.   She hasB12 deficiency. B12 was 231 on 07/09/2016. She is on oral B12. B12 and folate were normal on 03/09/2017 and 10/12/2017.  She developed peri-oral and intranasalherpes simplex-1. She was treated with valacyclovir and doxycycline on 10/19/2016. She developed a transient elevated alkaline phosphataseon 10/19/2016 which subsequently normalized.  She was documented to have awarm autoantibodyon 10/12/2017. LDH and bilirubin were normal. Hematocrit improved. She began prednisone10 mg on 12/07/2017 (discontinued 03/2018).She received Rituxanweekly x 4 (last dose 06/16/2018).  Entecavir was discontinued on 07/06/2019.  She developedflu like symptomson 10/26/2017. Symptoms included cough, myalgias, and fever (tmax unknown). She was prescribed  Mucinex, Tamiflu, and amoxicillin. She only took amoxicillin x 5 days. She developedincreased liver function testson 11/02/2017. CMV IgG was positive. EBV VCA IgG, NA IgG, early antigen antibody IgG were elevated. EBV VCA IgM was <36. Testing was c/w a convalescence/past infection or reactivated infection. LFTs normalized on 11/15/2017. Smooth muscle antibodywas 39 (high) on 11/10/2017 and 34 (high) on 11/30/2017.   Labs on 06/12/2020 revealed recurrent anemia.  Hematocrit was 27.6, hemoglobin 8.4, MCV 104.5, platelets 138,000, WBC 3,400 (ANC 2,300). Creatinine was 1.17 (CrCl 50 ml/min).  Normal labs included ferritin (434), iron studies, B12 (885), folate (> 100).  Coombs polyspecific AGH test was positive (warm autoantibody).  Reticulocyte count was 7.1%.  LDH was 204 (98-192).  Peripheral smear revealed macrocytic anemia with mild thrombocytopenia. The morphology of RBCs, WBCs, and platelets were within normal limits. There was no evidence of circulating blasts or schistocytes.  She began prednisone 1 mg/kg (80 mg) on 06/16/2020.  Prednisone was decreased to 20 mg a day on 111/24/2021.  She received Rituxan 1000 mg on day 1 and 15 (07/15/2020 and 07/29/2020).  She began entecavir on 06/28/2020.  She is on prednisone 2.5 mg a day.  She is on Septra for PCP prophylaxis.   Chest CTon 11/16/2017 revealed a 2.2 x 2.0 x 2.4 spiculated RIGHT middle lobe mass. There was tiny nonspecific upper lobe pulmonary nodules greater on RIGHT, largest 3 mm, of uncertain etiology. There was additional 8 x 8 mm opacity in the posterior sulcus of the RIGHT lower lobe. PET scanon 11/22/2017 revealed a 2 cm hypermetabolic right middle lobe lung mass (SUV 3.4) consistent with primary lung neoplasm. There were no findings for mediastinal/hilar lymphadenopathy or metastatic disease. There were areas of hypermetabolism involving the left oblique abdominal muscles and the anorectal junction.  PFTs on 11/18/2017  revealed an  FEV1 of 1.22 liters (51%). DLCO adj was 6.8 mL/mmHg/min (32%).  ENB done on 12/07/2017. Cytology was negative for malignancy. Pathologydemonstrated organizing pneumonia and chronic bronchitis. Pathologist commented that organizing pneumonia spanned about 2 mm in one fragment. Cases of focal organizing pneumonia with hypermetabolism on FDG-PET have been described, but changes of this type can also be adjacent to a neoplasm. Patient prescribed a daily dose of Prednisone 10 mg, with re-imaging planned in 6-8 weeks.   Low dose chest CT on 07/24/2020 revealed Lung-RADS 2, benign appearance or behavior. There was emphysema, aortic atherosclerosis, and coronary artery calcifications.   She has diarrhea. 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 She received azithromycin x 3 days. She received ciprofloxacin x 10 days without improvement. She has seen ID in [place]. She began Bactrim on 03/04/2018 (discontinued on 03/11/2018) secondary to increase in creatine.  She has renal insufficiency. Creatininehas been followed: 1.17 on 11/05/2017, 1.85 on 11/09/2017, 1.38 on 11/12/2017, 1.74 on 11/15/2017, 1.66 on 11/17/2017, and 1.48 on 11/23/2017. Urinalysis on 11/15/2017 revealed revealed no hemoglobin, bilirubin or active sediment.  Liver function tests increased on 02/15/2019.  Work-up on 02/16/2019 revealed hepatitis B E antibody positive.  Negative studies included: hepatitis A total antibody, hepatitis A IgM, hepatitis B surface antigen, hepatitis B E antigen.  Hepatitis B DNA was not detected.  Folate was 80.5 and vitamin B12 was 443.  She has received her vaccinations:  She has completed the PCV13, PPSV23, and HiB.  She received Menvio (quadrivalent meningoccal vaccine) and Bexero (univalent serogroup B meningoccal vaccine) on 07/06/2019.  She was admitted to [facility] from 07/16/2020 - 07/18/2020 with a fever and UTI.  Blood and urine culture grew E coli.   She received ceftriaxone and was discharged on Septra DS 1 tablet BID.  Septra was switched to Keflex secondary to renal insufficiency.  She was diagnosed with C difficile + diarrhea on 08/12/2020.  She completed a course of oral vancomycin.  Stool was negative for C. difficile on 08/27/2020.  Bowel movements are normal..  She notes that she tested positive for COVID-19 on 08/21/2019.  She received the Moderna COVID-19 vaccine on 11/10/2019 and 12/08/2019.   Symptomatically,  she feels "ok." Her urine and stool are normal in color. She started Plaquenil 4 days ago and has had no side effects. She takes Septra three times per week.  Facial rash has improved with a topical crea prescribed by dermatology.  Plan: 1.   Labs today: CBC with diff, CMP, LDH. 2.Warm autoimmune hemolytic anemia Clinically, she is doing well.       She active warm autoimmune hemolytic anemia responding well to Rituxan and a steroid taper. Hematocrit 27.6.  Hemoglobin   8.4.  LDH 204 (98-192) on 06/12/2020.                   Work-up confirmed warm autoantibodies.       Hematocrit 30.4.  Hemoglobin   9.2.  LDH 164 (98-192) on 06/19/2020.                   She began prednisone 1 mg/kg (80 mg) on 06/16/2020.       Hematocrit 32.5.  Hemoglobin 10.6.  LDH 169 on 10/10/2020.       She is s/p day 1 and 15 Rituxan (last 07/29/2020).       She is on prednisone 2.5 mg a day.       Continue slow steroid taper based on blood  counts.  Continue prednisone 2.5 mg a day. 3.Hepatitis B Patient was previously exposed to hepatitis B. She was on entecavir x 1 year when she previously received Rituxan (stopped 07/06/2019). Patient did not complete her meningococcal vaccines.       She began entecavir 0.5 mg daily on 06/28/2020.                   Dose adjusted based on renal function.                   CrCl 30-49 ml/min   50% dosing (0.5 mg QOD).                   CrCl >= 50 ml/min 100% dosing (0.5 mg  a day).  Creatinine 1.33 (CrCl 46.3 ml/min).  Continue entecavir 0.5 mg every other day.       Monitor creatinine and just entecavir dose as needed. 4.B12 deficiency B12 was 443 on 02/15/2019 and 885 on 06/12/2020.             Folate was 80.5 on 02/15/2019 and >100 on 06/12/2020.                         Folate was 92.0 on 08/19/2020.             She is currently on folic acid 1 mg a week.  Continue to monitor 5. Right middle lobe nodule             Chest CT on 12/26/2019 revealed stable sub-cm bilateral pulmonary nodules c/w benign etiology.                          There was a new airspace disease in inferior right middle lobe, with a few adjacent subcentimeter nodules.                          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             Low-dose chest CT on 07/24/2020 revealedLung-RADS 2, benign appearance or behavior.             Patient is followed by Dr. Juliann Mule in pulmonary medicine.   6.   Facial rash             Etiology remains unclear.             Patient has been seen by dermatology.    Rash has improved on a topical cream. 7.   PCP prophylaxis             Patient on Septra DS on Mondays, Wednesdays, and Fridays.  Monitor creatinine given history of increased creatinine with full dose Septra (BID). 8.   C difficile + diarrhea  Patient diagnosed on 08/12/2020.  She completed oral vancomycin.  Stool was negative for C. difficile on 08/27/2020.  9.   RTC in 1 week for labs (CBC, creatinine). 10.   RTC in 3 weeks for labs (CBC, BMP, LDH). 11.   RTC in 5 weeks for MD assessment, labs (CBC with diff, CMP, LDH).  I discussed the assessment and treatment plan with the patient.  The patient was provided an opportunity to ask questions and all were answered.  The patient agreed with the plan and demonstrated an understanding of the instructions.  The patient was advised  to call back or seek an in person evaluation if the  symptoms worsen or if the condition fails to improve as anticipated.   Nolon Stalls, MD, PhD  10/10/2020, 3:03 PM  I, Mirian Mo Tufford, am acting as Education administrator for [employer]. Mike Gip, MD, PhD.  I, Jayleana Colberg C. Mike Gip, MD, have reviewed the above documentation for accuracy and completeness, and I agree with the above.

## 2020-10-10 ENCOUNTER — Inpatient Hospital Stay: Payer: BC Managed Care – PPO | Attending: Hematology and Oncology

## 2020-10-10 ENCOUNTER — Other Ambulatory Visit: Payer: Self-pay

## 2020-10-10 ENCOUNTER — Inpatient Hospital Stay (HOSPITAL_BASED_OUTPATIENT_CLINIC_OR_DEPARTMENT_OTHER): Payer: BC Managed Care – PPO | Admitting: Hematology and Oncology

## 2020-10-10 ENCOUNTER — Encounter: Payer: Self-pay | Admitting: Hematology and Oncology

## 2020-10-10 VITALS — BP 121/53 | HR 76 | Temp 96.0°F | Resp 16 | Wt 186.5 lb

## 2020-10-10 DIAGNOSIS — E538 Deficiency of other specified B group vitamins: Secondary | ICD-10-CM

## 2020-10-10 DIAGNOSIS — D5911 Warm autoimmune hemolytic anemia: Secondary | ICD-10-CM | POA: Insufficient documentation

## 2020-10-10 DIAGNOSIS — Z8249 Family history of ischemic heart disease and other diseases of the circulatory system: Secondary | ICD-10-CM | POA: Diagnosis not present

## 2020-10-10 DIAGNOSIS — Z79899 Other long term (current) drug therapy: Secondary | ICD-10-CM | POA: Insufficient documentation

## 2020-10-10 DIAGNOSIS — D591 Autoimmune hemolytic anemia, unspecified: Secondary | ICD-10-CM

## 2020-10-10 DIAGNOSIS — R918 Other nonspecific abnormal finding of lung field: Secondary | ICD-10-CM | POA: Insufficient documentation

## 2020-10-10 DIAGNOSIS — Z87891 Personal history of nicotine dependence: Secondary | ICD-10-CM | POA: Insufficient documentation

## 2020-10-10 DIAGNOSIS — R911 Solitary pulmonary nodule: Secondary | ICD-10-CM

## 2020-10-10 DIAGNOSIS — B1910 Unspecified viral hepatitis B without hepatic coma: Secondary | ICD-10-CM | POA: Insufficient documentation

## 2020-10-10 DIAGNOSIS — R768 Other specified abnormal immunological findings in serum: Secondary | ICD-10-CM | POA: Diagnosis not present

## 2020-10-10 DIAGNOSIS — Z7902 Long term (current) use of antithrombotics/antiplatelets: Secondary | ICD-10-CM | POA: Insufficient documentation

## 2020-10-10 DIAGNOSIS — Z833 Family history of diabetes mellitus: Secondary | ICD-10-CM | POA: Diagnosis not present

## 2020-10-10 DIAGNOSIS — R21 Rash and other nonspecific skin eruption: Secondary | ICD-10-CM

## 2020-10-10 DIAGNOSIS — D696 Thrombocytopenia, unspecified: Secondary | ICD-10-CM

## 2020-10-10 LAB — COMPREHENSIVE METABOLIC PANEL
ALT: 13 U/L (ref 0–44)
AST: 15 U/L (ref 15–41)
Albumin: 3.9 g/dL (ref 3.5–5.0)
Alkaline Phosphatase: 58 U/L (ref 38–126)
Anion gap: 11 (ref 5–15)
BUN: 23 mg/dL (ref 8–23)
CO2: 26 mmol/L (ref 22–32)
Calcium: 9.2 mg/dL (ref 8.9–10.3)
Chloride: 103 mmol/L (ref 98–111)
Creatinine, Ser: 1.33 mg/dL — ABNORMAL HIGH (ref 0.44–1.00)
GFR, Estimated: 45 mL/min — ABNORMAL LOW (ref >60)
Glucose, Bld: 130 mg/dL — ABNORMAL HIGH (ref 70–99)
Potassium: 4.2 mmol/L (ref 3.5–5.1)
Sodium: 140 mmol/L (ref 135–145)
Total Bilirubin: 0.7 mg/dL (ref 0.3–1.2)
Total Protein: 6.9 g/dL (ref 6.5–8.1)

## 2020-10-10 LAB — CBC WITH DIFFERENTIAL/PLATELET
Abs Immature Granulocytes: 0.03 10*3/uL (ref 0.00–0.07)
Basophils Absolute: 0.1 10*3/uL (ref 0.0–0.1)
Basophils Relative: 1 %
Eosinophils Absolute: 0.1 10*3/uL (ref 0.0–0.5)
Eosinophils Relative: 2 %
HCT: 32.5 % — ABNORMAL LOW (ref 36.0–46.0)
Hemoglobin: 10.6 g/dL — ABNORMAL LOW (ref 12.0–15.0)
Immature Granulocytes: 1 %
Lymphocytes Relative: 16 %
Lymphs Abs: 1 10*3/uL (ref 0.7–4.0)
MCH: 31.2 pg (ref 26.0–34.0)
MCHC: 32.6 g/dL (ref 30.0–36.0)
MCV: 95.6 fL (ref 80.0–100.0)
Monocytes Absolute: 0.5 10*3/uL (ref 0.1–1.0)
Monocytes Relative: 8 %
Neutro Abs: 4.5 10*3/uL (ref 1.7–7.7)
Neutrophils Relative %: 72 %
Platelets: 159 10*3/uL (ref 150–400)
RBC: 3.4 MIL/uL — ABNORMAL LOW (ref 3.87–5.11)
RDW: 14.8 % (ref 11.5–15.5)
WBC: 6.2 10*3/uL (ref 4.0–10.5)
nRBC: 0 % (ref 0.0–0.2)

## 2020-10-10 LAB — LACTATE DEHYDROGENASE: LDH: 169 U/L (ref 98–192)

## 2020-10-10 MED ORDER — SULFAMETHOXAZOLE-TRIMETHOPRIM 800-160 MG PO TABS: 1.0000 | ORAL_TABLET | ORAL | 1 refills | Status: DC

## 2020-10-10 MED ORDER — PREDNISONE 2.5 MG PO TABS: 2.5000 mg | ORAL_TABLET | Freq: Every day | ORAL | 0 refills | Status: DC

## 2020-10-10 NOTE — Patient Instructions (Signed)
  Continue prednisone 2.5 mg a day.

## 2020-10-15 ENCOUNTER — Encounter
Admit: 2020-10-15 | Discharge: 2020-10-16 | Payer: PRIVATE HEALTH INSURANCE | Attending: Internal Medicine | Primary: Internal Medicine

## 2020-10-15 DIAGNOSIS — Z79899 Other long term (current) drug therapy: Principal | ICD-10-CM

## 2020-10-15 DIAGNOSIS — R768 Other specified abnormal immunological findings in serum: Principal | ICD-10-CM

## 2020-10-15 DIAGNOSIS — D591 AIHA (autoimmune hemolytic anemia) (CMS-HCC): Principal | ICD-10-CM

## 2020-10-15 DIAGNOSIS — Z5181 Encounter for therapeutic drug level monitoring: Principal | ICD-10-CM

## 2020-10-15 DIAGNOSIS — Z8619 Personal history of other infectious and parasitic diseases: Principal | ICD-10-CM

## 2020-10-15 MED ORDER — ENTECAVIR 0.5 MG TABLET
ORAL_TABLET | ORAL | 2 refills | 30 days | Status: CP
Start: 2020-10-15 — End: 2021-01-13

## 2020-10-17 ENCOUNTER — Telehealth: Payer: Self-pay

## 2020-10-17 ENCOUNTER — Inpatient Hospital Stay: Payer: BC Managed Care – PPO

## 2020-10-17 ENCOUNTER — Other Ambulatory Visit: Payer: Self-pay

## 2020-10-17 DIAGNOSIS — B1910 Unspecified viral hepatitis B without hepatic coma: Secondary | ICD-10-CM | POA: Diagnosis not present

## 2020-10-17 DIAGNOSIS — D591 Autoimmune hemolytic anemia, unspecified: Secondary | ICD-10-CM

## 2020-10-17 LAB — CBC
HCT: 32.7 % — ABNORMAL LOW (ref 36.0–46.0)
Hemoglobin: 10.4 g/dL — ABNORMAL LOW (ref 12.0–15.0)
MCH: 30.8 pg (ref 26.0–34.0)
MCHC: 31.8 g/dL (ref 30.0–36.0)
MCV: 96.7 fL (ref 80.0–100.0)
Platelets: 155 10*3/uL (ref 150–400)
RBC: 3.38 MIL/uL — ABNORMAL LOW (ref 3.87–5.11)
RDW: 14.6 % (ref 11.5–15.5)
WBC: 5.6 10*3/uL (ref 4.0–10.5)
nRBC: 0 % (ref 0.0–0.2)

## 2020-10-17 LAB — CREATININE, SERUM
Creatinine, Ser: 1.36 mg/dL — ABNORMAL HIGH (ref 0.44–1.00)
GFR, Estimated: 44 mL/min — ABNORMAL LOW (ref >60)

## 2020-10-21 ENCOUNTER — Ambulatory Visit: Payer: BC Managed Care – PPO | Admitting: Hematology and Oncology

## 2020-10-21 ENCOUNTER — Other Ambulatory Visit: Payer: BC Managed Care – PPO

## 2020-10-24 ENCOUNTER — Other Ambulatory Visit: Payer: Self-pay

## 2020-10-29 ENCOUNTER — Other Ambulatory Visit: Payer: BC Managed Care – PPO

## 2020-10-29 ENCOUNTER — Ambulatory Visit: Payer: BC Managed Care – PPO | Admitting: Hematology and Oncology

## 2020-10-31 ENCOUNTER — Inpatient Hospital Stay: Payer: BC Managed Care – PPO | Attending: Hematology and Oncology

## 2020-10-31 ENCOUNTER — Other Ambulatory Visit: Payer: Self-pay

## 2020-10-31 DIAGNOSIS — D5911 Warm autoimmune hemolytic anemia: Secondary | ICD-10-CM | POA: Insufficient documentation

## 2020-10-31 DIAGNOSIS — E538 Deficiency of other specified B group vitamins: Secondary | ICD-10-CM | POA: Diagnosis not present

## 2020-10-31 DIAGNOSIS — N289 Disorder of kidney and ureter, unspecified: Secondary | ICD-10-CM | POA: Diagnosis not present

## 2020-10-31 DIAGNOSIS — R918 Other nonspecific abnormal finding of lung field: Secondary | ICD-10-CM | POA: Diagnosis not present

## 2020-10-31 DIAGNOSIS — Z8249 Family history of ischemic heart disease and other diseases of the circulatory system: Secondary | ICD-10-CM | POA: Insufficient documentation

## 2020-10-31 DIAGNOSIS — Z87891 Personal history of nicotine dependence: Secondary | ICD-10-CM | POA: Diagnosis not present

## 2020-10-31 DIAGNOSIS — I10 Essential (primary) hypertension: Secondary | ICD-10-CM | POA: Insufficient documentation

## 2020-10-31 DIAGNOSIS — L930 Discoid lupus erythematosus: Secondary | ICD-10-CM | POA: Diagnosis not present

## 2020-10-31 DIAGNOSIS — Z833 Family history of diabetes mellitus: Secondary | ICD-10-CM | POA: Diagnosis not present

## 2020-10-31 DIAGNOSIS — E039 Hypothyroidism, unspecified: Secondary | ICD-10-CM | POA: Diagnosis not present

## 2020-10-31 DIAGNOSIS — D591 Autoimmune hemolytic anemia, unspecified: Secondary | ICD-10-CM

## 2020-10-31 LAB — CBC
HCT: 32.4 % — ABNORMAL LOW (ref 36.0–46.0)
Hemoglobin: 10.4 g/dL — ABNORMAL LOW (ref 12.0–15.0)
MCH: 30.9 pg (ref 26.0–34.0)
MCHC: 32.1 g/dL (ref 30.0–36.0)
MCV: 96.1 fL (ref 80.0–100.0)
Platelets: 122 10*3/uL — ABNORMAL LOW (ref 150–400)
RBC: 3.37 MIL/uL — ABNORMAL LOW (ref 3.87–5.11)
RDW: 14.5 % (ref 11.5–15.5)
WBC: 4.5 10*3/uL (ref 4.0–10.5)
nRBC: 0 % (ref 0.0–0.2)

## 2020-10-31 LAB — BASIC METABOLIC PANEL
Anion gap: 13 (ref 5–15)
BUN: 26 mg/dL — ABNORMAL HIGH (ref 8–23)
CO2: 23 mmol/L (ref 22–32)
Calcium: 9.1 mg/dL (ref 8.9–10.3)
Chloride: 102 mmol/L (ref 98–111)
Creatinine, Ser: 1.3 mg/dL — ABNORMAL HIGH (ref 0.44–1.00)
GFR, Estimated: 46 mL/min — ABNORMAL LOW (ref >60)
Glucose, Bld: 102 mg/dL — ABNORMAL HIGH (ref 70–99)
Potassium: 3.7 mmol/L (ref 3.5–5.1)
Sodium: 138 mmol/L (ref 135–145)

## 2020-10-31 LAB — LACTATE DEHYDROGENASE: LDH: 173 U/L (ref 98–192)

## 2020-11-14 ENCOUNTER — Other Ambulatory Visit: Payer: BC Managed Care – PPO

## 2020-11-14 ENCOUNTER — Ambulatory Visit: Payer: BC Managed Care – PPO | Admitting: Hematology and Oncology

## 2020-11-17 ENCOUNTER — Other Ambulatory Visit: Payer: Self-pay

## 2020-11-17 ENCOUNTER — Inpatient Hospital Stay
Admission: EM | Admit: 2020-11-17 | Discharge: 2020-11-20 | DRG: 866 | Disposition: A | Discharge status: Home / Self Care | Payer: BC Managed Care – PPO | Attending: Internal Medicine | Admitting: Internal Medicine

## 2020-11-17 DIAGNOSIS — B028 Zoster with other complications: Secondary | ICD-10-CM | POA: Diagnosis not present

## 2020-11-17 DIAGNOSIS — R519 Headache, unspecified: Secondary | ICD-10-CM | POA: Diagnosis present

## 2020-11-17 DIAGNOSIS — I70213 Atherosclerosis of native arteries of extremities with intermittent claudication, bilateral legs: Secondary | ICD-10-CM

## 2020-11-17 DIAGNOSIS — IMO0002 Reserved for concepts with insufficient information to code with codable children: Secondary | ICD-10-CM | POA: Diagnosis present

## 2020-11-17 DIAGNOSIS — Z20822 Contact with and (suspected) exposure to covid-19: Secondary | ICD-10-CM | POA: Diagnosis present

## 2020-11-17 DIAGNOSIS — M79606 Pain in leg, unspecified: Secondary | ICD-10-CM | POA: Diagnosis present

## 2020-11-17 DIAGNOSIS — Z87891 Personal history of nicotine dependence: Secondary | ICD-10-CM

## 2020-11-17 DIAGNOSIS — B181 Chronic viral hepatitis B without delta-agent: Secondary | ICD-10-CM | POA: Diagnosis present

## 2020-11-17 DIAGNOSIS — I10 Essential (primary) hypertension: Secondary | ICD-10-CM

## 2020-11-17 DIAGNOSIS — R21 Rash and other nonspecific skin eruption: Secondary | ICD-10-CM | POA: Diagnosis not present

## 2020-11-17 DIAGNOSIS — Z886 Allergy status to analgesic agent status: Secondary | ICD-10-CM

## 2020-11-17 DIAGNOSIS — D591 Autoimmune hemolytic anemia, unspecified: Secondary | ICD-10-CM | POA: Diagnosis not present

## 2020-11-17 DIAGNOSIS — R195 Other fecal abnormalities: Secondary | ICD-10-CM | POA: Diagnosis present

## 2020-11-17 DIAGNOSIS — M329 Systemic lupus erythematosus, unspecified: Secondary | ICD-10-CM | POA: Diagnosis not present

## 2020-11-17 DIAGNOSIS — D696 Thrombocytopenia, unspecified: Secondary | ICD-10-CM | POA: Diagnosis present

## 2020-11-17 DIAGNOSIS — E538 Deficiency of other specified B group vitamins: Secondary | ICD-10-CM | POA: Diagnosis present

## 2020-11-17 DIAGNOSIS — E039 Hypothyroidism, unspecified: Secondary | ICD-10-CM | POA: Diagnosis present

## 2020-11-17 DIAGNOSIS — N183 Chronic kidney disease, stage 3 unspecified: Secondary | ICD-10-CM | POA: Diagnosis present

## 2020-11-17 DIAGNOSIS — M79605 Pain in left leg: Secondary | ICD-10-CM | POA: Diagnosis present

## 2020-11-17 DIAGNOSIS — Z833 Family history of diabetes mellitus: Secondary | ICD-10-CM

## 2020-11-17 DIAGNOSIS — Z720 Tobacco use: Secondary | ICD-10-CM | POA: Diagnosis present

## 2020-11-17 DIAGNOSIS — B029 Zoster without complications: Secondary | ICD-10-CM | POA: Diagnosis not present

## 2020-11-17 DIAGNOSIS — D519 Vitamin B12 deficiency anemia, unspecified: Secondary | ICD-10-CM | POA: Diagnosis present

## 2020-11-17 DIAGNOSIS — J449 Chronic obstructive pulmonary disease, unspecified: Secondary | ICD-10-CM | POA: Diagnosis present

## 2020-11-17 DIAGNOSIS — R509 Fever, unspecified: Secondary | ICD-10-CM | POA: Diagnosis present

## 2020-11-17 DIAGNOSIS — Z23 Encounter for immunization: Secondary | ICD-10-CM

## 2020-11-17 DIAGNOSIS — B9682 Vibrio vulnificus as the cause of diseases classified elsewhere: Secondary | ICD-10-CM

## 2020-11-17 DIAGNOSIS — N189 Chronic kidney disease, unspecified: Secondary | ICD-10-CM | POA: Diagnosis not present

## 2020-11-17 DIAGNOSIS — D5911 Warm autoimmune hemolytic anemia: Secondary | ICD-10-CM | POA: Diagnosis not present

## 2020-11-17 DIAGNOSIS — D849 Immunodeficiency, unspecified: Secondary | ICD-10-CM | POA: Diagnosis present

## 2020-11-17 DIAGNOSIS — D649 Anemia, unspecified: Secondary | ICD-10-CM | POA: Diagnosis present

## 2020-11-17 DIAGNOSIS — I70219 Atherosclerosis of native arteries of extremities with intermittent claudication, unspecified extremity: Secondary | ICD-10-CM | POA: Diagnosis present

## 2020-11-17 DIAGNOSIS — D589 Hereditary hemolytic anemia, unspecified: Secondary | ICD-10-CM | POA: Diagnosis present

## 2020-11-17 DIAGNOSIS — M79662 Pain in left lower leg: Secondary | ICD-10-CM | POA: Diagnosis not present

## 2020-11-17 DIAGNOSIS — N1832 Chronic kidney disease, stage 3b: Secondary | ICD-10-CM

## 2020-11-17 DIAGNOSIS — Z79890 Hormone replacement therapy (postmenopausal): Secondary | ICD-10-CM | POA: Diagnosis not present

## 2020-11-17 DIAGNOSIS — I129 Hypertensive chronic kidney disease with stage 1 through stage 4 chronic kidney disease, or unspecified chronic kidney disease: Secondary | ICD-10-CM | POA: Diagnosis present

## 2020-11-17 DIAGNOSIS — R42 Dizziness and giddiness: Secondary | ICD-10-CM | POA: Diagnosis present

## 2020-11-17 DIAGNOSIS — N179 Acute kidney failure, unspecified: Secondary | ICD-10-CM | POA: Diagnosis not present

## 2020-11-17 DIAGNOSIS — N1831 Chronic kidney disease, stage 3a: Secondary | ICD-10-CM | POA: Diagnosis present

## 2020-11-17 DIAGNOSIS — Z8249 Family history of ischemic heart disease and other diseases of the circulatory system: Secondary | ICD-10-CM

## 2020-11-17 DIAGNOSIS — A498 Other bacterial infections of unspecified site: Secondary | ICD-10-CM

## 2020-11-17 LAB — BASIC METABOLIC PANEL
Anion gap: 7 (ref 5–15)
BUN: 42 mg/dL — ABNORMAL HIGH (ref 8–23)
CO2: 25 mmol/L (ref 22–32)
Calcium: 9 mg/dL (ref 8.9–10.3)
Chloride: 106 mmol/L (ref 98–111)
Creatinine, Ser: 2.56 mg/dL — ABNORMAL HIGH (ref 0.44–1.00)
GFR, Estimated: 21 mL/min — ABNORMAL LOW (ref >60)
Glucose, Bld: 102 mg/dL — ABNORMAL HIGH (ref 70–99)
Potassium: 4.8 mmol/L (ref 3.5–5.1)
Sodium: 138 mmol/L (ref 135–145)

## 2020-11-17 LAB — CBC WITH DIFFERENTIAL/PLATELET
Abs Immature Granulocytes: 0.01 10*3/uL (ref 0.00–0.07)
Basophils Absolute: 0.1 10*3/uL (ref 0.0–0.1)
Basophils Relative: 2 %
Eosinophils Absolute: 0.1 10*3/uL (ref 0.0–0.5)
Eosinophils Relative: 3 %
HCT: 36.1 % (ref 36.0–46.0)
Hemoglobin: 11.5 g/dL — ABNORMAL LOW (ref 12.0–15.0)
Immature Granulocytes: 0 %
Lymphocytes Relative: 23 %
Lymphs Abs: 1 10*3/uL (ref 0.7–4.0)
MCH: 30.5 pg (ref 26.0–34.0)
MCHC: 31.9 g/dL (ref 30.0–36.0)
MCV: 95.8 fL (ref 80.0–100.0)
Monocytes Absolute: 0.6 10*3/uL (ref 0.1–1.0)
Monocytes Relative: 14 %
Neutro Abs: 2.5 10*3/uL (ref 1.7–7.7)
Neutrophils Relative %: 58 %
Platelets: 127 10*3/uL — ABNORMAL LOW (ref 150–400)
RBC: 3.77 MIL/uL — ABNORMAL LOW (ref 3.87–5.11)
RDW: 14.4 % (ref 11.5–15.5)
WBC: 4.3 10*3/uL (ref 4.0–10.5)
nRBC: 0 % (ref 0.0–0.2)

## 2020-11-17 LAB — VITAMIN B12: Vitamin B-12: 853 pg/mL (ref 180–914)

## 2020-11-17 LAB — FERRITIN: Ferritin: 368 ng/mL — ABNORMAL HIGH (ref 11–307)

## 2020-11-17 LAB — SARS CORONAVIRUS 2 (TAT 6-24 HRS): SARS Coronavirus 2: NEGATIVE

## 2020-11-17 LAB — T4, FREE: Free T4: 0.96 ng/dL (ref 0.61–1.12)

## 2020-11-17 LAB — IRON AND TIBC
Iron: 37 ug/dL (ref 28–170)
Saturation Ratios: 13 % (ref 10.4–31.8)
TIBC: 290 ug/dL (ref 250–450)
UIBC: 253 ug/dL

## 2020-11-17 LAB — FOLATE: Folate: 30 ng/mL (ref >5.9)

## 2020-11-17 LAB — RETICULOCYTES
Immature Retic Fract: 8.4 % (ref 2.3–15.9)
RBC.: 4 MIL/uL (ref 3.87–5.11)
Retic Count, Absolute: 83.6 10*3/uL (ref 19.0–186.0)
Retic Ct Pct: 2.1 % (ref 0.4–3.1)

## 2020-11-17 LAB — LACTIC ACID, PLASMA
Lactic Acid, Venous: 1.4 mmol/L (ref 0.5–1.9)
Lactic Acid, Venous: 1.2 mmol/L (ref 0.5–1.9)

## 2020-11-17 LAB — SEDIMENTATION RATE: Sed Rate: 21 mm/hr (ref 0–30)

## 2020-11-17 LAB — TSH: TSH: 4.201 u[IU]/mL (ref 0.350–4.500)

## 2020-11-17 MED ORDER — TETANUS-DIPHTH-ACELL PERTUSSIS 5-2.5-18.5 LF-MCG/0.5 IM SUSY
0.5000 mL | PREFILLED_SYRINGE | Freq: Once | INTRAMUSCULAR | Status: AC
  Administered 2020-11-18: 0.5 mL via INTRAMUSCULAR
  Filled 2020-11-17: qty 0.5

## 2020-11-17 MED ORDER — HEPARIN SODIUM (PORCINE) 5000 UNIT/ML IJ SOLN
5000.0000 [IU] | Freq: Three times a day (TID) | INTRAMUSCULAR | Status: DC
  Administered 2020-11-17 – 2020-11-20 (×9): 5000 [IU] via SUBCUTANEOUS
  Filled 2020-11-17 (×9): qty 1

## 2020-11-17 MED ORDER — HYDRALAZINE HCL 50 MG PO TABS
50.0000 mg | ORAL_TABLET | Freq: Three times a day (TID) | ORAL | Status: DC
  Administered 2020-11-17 – 2020-11-18 (×3): 50 mg via ORAL
  Filled 2020-11-17 (×4): qty 1

## 2020-11-17 MED ORDER — ONDANSETRON HCL 4 MG/2ML IJ SOLN: 4.0000 mg | Freq: Four times a day (QID) | INTRAMUSCULAR | Status: DC | PRN

## 2020-11-17 MED ORDER — SODIUM CHLORIDE 0.9 % IV SOLN: 1.0000 g | INTRAVENOUS | Status: DC

## 2020-11-17 MED ORDER — DOXYCYCLINE HYCLATE 100 MG PO TABS
100.0000 mg | ORAL_TABLET | Freq: Once | ORAL | Status: AC
  Administered 2020-11-17: 100 mg via ORAL
  Filled 2020-11-17: qty 1

## 2020-11-17 MED ORDER — DOXYCYCLINE HYCLATE 100 MG PO TABS
100.0000 mg | ORAL_TABLET | Freq: Two times a day (BID) | ORAL | Status: DC
  Administered 2020-11-17 – 2020-11-19 (×4): 100 mg via ORAL
  Filled 2020-11-17 (×4): qty 1

## 2020-11-17 MED ORDER — SODIUM CHLORIDE 0.9 % IV SOLN: 1.0000 g | Freq: Two times a day (BID) | INTRAVENOUS | Status: DC

## 2020-11-17 MED ORDER — SODIUM CHLORIDE 0.9 % IV SOLN
1.0000 g | Freq: Two times a day (BID) | INTRAVENOUS | Status: DC
  Administered 2020-11-17 – 2020-11-18 (×3): 1 g via INTRAVENOUS
  Filled 2020-11-17 (×5): qty 1

## 2020-11-17 MED ORDER — SODIUM CHLORIDE 0.9 % IV SOLN: INTRAVENOUS | Status: DC

## 2020-11-17 MED ORDER — ACETAMINOPHEN 325 MG PO TABS
650.0000 mg | ORAL_TABLET | Freq: Four times a day (QID) | ORAL | Status: DC | PRN
  Administered 2020-11-17 – 2020-11-20 (×6): 650 mg via ORAL
  Filled 2020-11-17 (×6): qty 2

## 2020-11-17 MED ORDER — LACTATED RINGERS IV BOLUS (SEPSIS)
1000.0000 mL | Freq: Once | INTRAVENOUS | Status: AC
  Administered 2020-11-17: 1000 mL via INTRAVENOUS

## 2020-11-17 MED ORDER — SODIUM CHLORIDE 0.9% FLUSH
3.0000 mL | Freq: Two times a day (BID) | INTRAVENOUS | Status: DC
  Administered 2020-11-17 – 2020-11-20 (×6): 3 mL via INTRAVENOUS

## 2020-11-17 MED ORDER — SODIUM CHLORIDE 0.9 % IV SOLN
2.0000 g | Freq: Once | INTRAVENOUS | Status: AC
  Administered 2020-11-17: 2 g via INTRAVENOUS
  Filled 2020-11-17: qty 2

## 2020-11-17 MED ORDER — PANTOPRAZOLE SODIUM 40 MG IV SOLR
40.0000 mg | INTRAVENOUS | Status: DC
  Administered 2020-11-17 – 2020-11-18 (×2): 40 mg via INTRAVENOUS
  Filled 2020-11-17 (×3): qty 40

## 2020-11-17 MED ORDER — ONDANSETRON HCL 4 MG PO TABS
4.0000 mg | ORAL_TABLET | Freq: Four times a day (QID) | ORAL | Status: DC | PRN
  Administered 2020-11-18: 4 mg via ORAL
  Filled 2020-11-17: qty 1

## 2020-11-17 MED ORDER — ACETAMINOPHEN 650 MG RE SUPP: 650.0000 mg | Freq: Four times a day (QID) | RECTAL | Status: DC | PRN

## 2020-11-17 NOTE — Progress Notes (Signed)
PHARMACY NOTE:  ANTIMICROBIAL RENAL DOSAGE ADJUSTMENT  Current antimicrobial regimen includes a mismatch between antimicrobial dosage and estimated renal function.  As per policy approved by the Pharmacy & Therapeutics and Medical Executive Committees, the antimicrobial dosage will be adjusted accordingly.  Current antimicrobial dosage:  Ceftazidime 1g IV q12h  Indication: wound infection  Renal Function:  Estimated Creatinine Clearance: 23.1 mL/min (A) (by C-G formula based on SCr of 2.56 mg/dL (H)). []      On intermittent HD, scheduled: []      On CRRT    Antimicrobial dosage has been changed to:  Ceftazidime 1g IV q24h  Additional comments:   Thank you for allowing pharmacy to be a part of this patient's care.  Vira Blanco, [facility] 11/17/2020 8:53 AM

## 2020-11-17 NOTE — ED Provider Notes (Signed)
[facility] Emergency Department Provider Note  ____________________________________________   Event Date/Time   First MD Initiated Contact with Patient 11/17/20 [phone]     (approximate)  I have reviewed the triage vital signs and the nursing notes.   HISTORY  Chief Complaint Abrasion (Left leg)    HPI Tina Page is a 65 y.o. 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  She presents for evaluation of a worsening rash on her left lower leg and at the top of her left buttock.  She was in the [place] last week and about 3 days ago she [place] that [place] abrasions to her left leg and her left lower back/upper buttock as she was trying to get up out of the surface.  Shortly thereafter she developed a red rash that is patchy in both of those areas and is rapidly gotten worse over the last 2 days.  She said it is not very painful but it stings a little bit and it itches.  There are bubbles on the red rash that appear to be fluid-filled.  The palms of her hands and soles of her feet are not involved.  She has no lesions or wounds anywhere else.  She denies fever, sore throat, chest pain, shortness of breath, nausea, vomiting, and abdominal pain.  She has continued to take her medications including her immunosuppressives.  Nothing in particular makes the symptoms better or worse.         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/23/2017   Post PTCA 07/12/2016   Thrombocytopenia ([place]) 10/04/2016    Patient Active Problem List   Diagnosis Date Noted   Diarrhea 09/01/2020   Clostridium difficile diarrhea 08/26/2020   Lupus ([place]) 08/11/2020   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   Chronic viral hepatitis B without delta agent and without coma ([place]) 08/11/2020   Facial rash 08/05/2020   Bacteremia due to Escherichia coli 07/17/2020   UTI (urinary tract infection) 07/16/2020   Hypothyroidism    Fever    Encounter for antineoplastic immunotherapy 07/15/2020   Goals of care, counseling/discussion 06/19/2020   Abnormal CT of the chest 06/04/2020   Oral candidiasis 06/04/2020   Former smoker 06/04/2020   Pulmonary nodules 12/28/2019   Chronic kidney disease, stage 3 unspecified ([place]) 05/15/2019   Postop check 04/24/2019   Paronychia of great toe of right foot 04/17/2019   Ingrowing nail 04/17/2019   Medication monitoring encounter 03/25/2018   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   Elevated liver function tests 11/03/2017   Elevated uric acid in blood 11/03/2017   Low serum  cortisol level (HCC) 01/04/2017   Elevated alkaline phosphatase level 10/25/2016   Oral herpes simplex infection 10/25/2016   Current chronic use of systemic steroids 10/23/2016   Impetigo 10/23/2016   Thrombocytopenia ([place]) 10/04/2016   B12 deficiency 08/06/2016   Symptomatic anemia 07/21/2016   Atherosclerosis of native arteries of extremity with intermittent claudication ([place]) 07/20/2016   PAD  (peripheral artery disease) ([place]) 07/12/2016   Post PTCA 07/12/2016   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   Autoimmune hemolytic anemia, 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   Hepatitis B core antibody positive 07/12/2016   Tobacco abuse 07/12/2016   Leg pain 07/07/2016   HTN (hypertension) 07/07/2016   HLD (hyperlipidemia) 07/07/2016   COPD (chronic obstructive pulmonary disease) ([place]) 07/07/2016   Heme positive stool 01/29/2015    Past Surgical History:  Procedure Laterality Date   ELECTROMAGNETIC NAVIGATION BROCHOSCOPY N/A 12/07/2017   Procedure: ELECTROMAGNETIC NAVIGATION BRONCHOSCOPY;  Surgeon: Flora Lipps, MD;  Location: [facility] ORS;  Service: Cardiopulmonary;  Laterality: N/A;   ESOPHAGOGASTRODUODENOSCOPY (EGD) WITH PROPOFOL N/A 07/08/2016   Procedure: ESOPHAGOGASTRODUODENOSCOPY (EGD) WITH PROPOFOL;  Surgeon: Manya Silvas, MD;  [facility]: [facility] ENDOSCOPY;  Service: Endoscopy;  Laterality: N/A;   KNEE SURGERY Right    repair of acl tear   PERIPHERAL VASCULAR CATHETERIZATION N/A 07/10/2016   Procedure: Lower Extremity Angiography;  Surgeon: Katha Cabal, MD;  Location: [place] CV LAB;  Service: Cardiovascular;  Laterality: N/A;   PERIPHERAL VASCULAR CATHETERIZATION N/A 07/10/2016   Procedure: Abdominal Aortogram w/Lower Extremity;  Surgeon: Katha Cabal, MD;  Location: [place] CV LAB;  Service: Cardiovascular;  Laterality: N/A;   PERIPHERAL VASCULAR CATHETERIZATION  07/10/2016   Procedure: Lower Extremity Intervention;  Surgeon: Katha Cabal, MD;  Location: [place] CV LAB;  Service: Cardiovascular;;    Prior to Admission medications   Medication Sig Start Date End Date Taking? Authorizing Provider  albuterol (PROVENTIL HFA;VENTOLIN HFA) 108 (90 Base) MCG/ACT inhaler Inhale 2 puffs into the lungs every 6 (six) hours as needed for wheezing or shortness of breath.     [provider]  allopurinol (ZYLOPRIM) 100 MG tablet  TAKE 1 TABLET BY MOUTH EVERY DAY Patient taking differently: Take 100 mg by mouth daily. 06/02/19   Lequita Asal, MD  clopidogrel (PLAVIX) 75 MG tablet Take 1 tablet (75 mg total) by mouth daily. 07/13/16   Theodoro Grist, MD  cyanocobalamin 500 MCG tablet Take 500 mcg by mouth daily.    [provider]  diclofenac Sodium (VOLTAREN) 1 % GEL Apply 2 g topically 4 (four) times daily. 07/18/20   Ezekiel Slocumb, DO  entecavir (BARACLUDE) 0.5 MG tablet Take by mouth. 06/26/20   [provider]  folic acid (FOLVITE) 1 MG tablet TAKE 1 TABLET (1 MG TOTAL) BY MOUTH DAILY. 08/29/18   Lequita Asal, MD  hydrochlorothiazide (HYDRODIURIL) 25 MG tablet Take 25 mg by mouth daily. 02/25/17   [provider]  levothyroxine (SYNTHROID, LEVOTHROID) 75 MCG tablet Take 75 mcg by mouth daily before breakfast.  12/30/17   [provider]  lisinopril (PRINIVIL,ZESTRIL) 20 MG tablet Take 20 mg by mouth daily.  12/23/16   [provider]  mupirocin ointment (BACTROBAN) 2 % Apply topically 2 (two) times daily. Patient not taking: No sig reported 07/18/20   Nicole Kindred A, DO  omeprazole (PRILOSEC) 20 MG capsule Take 1 capsule (20 mg total) by mouth daily. 08/02/16   Fritzi Mandes, MD  potassium chloride (KLOR-CON) 20 MEQ packet Take 20 mEq  by mouth daily.    [provider]  pravastatin (PRAVACHOL) 40 MG tablet Take 40 mg by mouth every evening.    [provider]  predniSONE (DELTASONE) 2.5 MG tablet Take 1 tablet (2.5 mg total) by mouth daily with breakfast. Taper as directed by MD. 10/10/20   Lequita Asal, MD  sulfamethoxazole-trimethoprim (BACTRIM DS) 800-160 MG tablet Take 1 tablet by mouth 3 (three) times a week. Take on Mondays, Wednesdays, Fridays 10/11/20   Lequita Asal, MD  Tiotropium Bromide Monohydrate (SPIRIVA RESPIMAT) 2.5 MCG/ACT AERS Inhale 2 puffs into the lungs daily for 1 day. 05/27/20 07/29/20  Martyn Ehrich, NP   Tiotropium Bromide-Olodaterol (STIOLTO RESPIMAT) 2.5-2.5 MCG/ACT AERS Inhale 2 puffs into the lungs daily. Patient not taking: Reported on 10/10/2020    [provider]    Allergies Ciprofloxacin, Codeine, and Nsaids  Family History  Problem Relation Age of Onset   Diabetes Mother    Hypertension Mother    Diabetes Maternal Grandfather    Hypertension Maternal Grandfather    Breast cancer Neg Hx     Social History Social History   Tobacco Use   Smoking status: Former Smoker    Packs/day: 1.25    Years: 47.00    Pack years: 58.75    Types: Cigarettes    Quit date: 07/11/2016    Years since quitting: 4.3   Smokeless tobacco: Never Used  Vaping Use   Vaping Use: Never used  Substance Use Topics   Alcohol use: No   Drug use: No    Review of Systems Constitutional: No fever/chills Eyes: No visual changes. ENT: No sore throat. Cardiovascular: Denies chest pain. Respiratory: Denies shortness of breath. Gastrointestinal: No abdominal pain.  No nausea, no vomiting.  No diarrhea.  No constipation. Genitourinary: Negative for dysuria. Musculoskeletal: Negative for neck pain.  Negative for back pain. Integumentary: Patchy red raised and vesicular rash on left lower extremity and on the top part of her left buttock, stinging pain and pruritic but not deeply painful or tender. Neurological: Negative for headaches, focal weakness or numbness.   ____________________________________________   PHYSICAL EXAM:  VITAL SIGNS: ED Triage Vitals [11/17/20 0249]  Enc Vitals Group     BP (!) 156/63     Pulse Rate 96     Resp 17     Temp 97.9 F (36.6 C)     Temp src      SpO2 98 %     Weight 81.6 kg (180 lb)     Height 1.6 m (5\' 3" )     Head Circumference      Peak Flow      Pain Score 8     Pain Loc      Pain Edu?      Excl. in [place]?     Constitutional: Alert and oriented.  Eyes: Conjunctivae are normal.  Head: Atraumatic. Nose: No  congestion/rhinnorhea. Mouth/Throat: Patient is wearing a mask. Neck: No stridor.  No meningeal signs.   Cardiovascular: Normal rate, regular rhythm. Good peripheral circulation. Respiratory: Normal respiratory effort.  No retractions. Gastrointestinal: Soft and nontender. No distention.  Musculoskeletal: No lower extremity tenderness nor edema. No gross deformities of extremities. Neurologic:  Normal speech and language. No gross focal neurologic deficits are appreciated.  Psychiatric: Mood and affect are normal. Speech and behavior are normal. Skin: Skin is warm and dry.  As described above the patient has scattered and patchy raised erythematous lesions on her anterior left lower leg  and extending onto the foot but sparing the sole.  Multiple of the lesions are vesicular.  They are nontender and the patient has no fluctuance, negative Nikolsky sign, compartments are soft and easily compressible, and there is no pain or tenderness to speak of (certainly not out of proportion with exam).  Additionally she has a lesion on the top of her left buttocks and the lower part of her lower back that is similar in appearance with 2 clustered vesicular lesions on an erythematous base that is raised.  These lesions have almost a necrotic appearance.  See photos below:        ____________________________________________   LABS (all labs ordered are listed, but only abnormal results are displayed)  Labs Reviewed  BASIC METABOLIC PANEL - Abnormal; Notable for the following components:      Result Value   Glucose, Bld 102 (*)    BUN 42 (*)    Creatinine, Ser 2.56 (*)    GFR, Estimated 21 (*)    All other components within normal limits  CBC WITH DIFFERENTIAL/PLATELET - Abnormal; Notable for the following components:   RBC 3.77 (*)    Hemoglobin 11.5 (*)    Platelets 127 (*)    All other components within normal limits  CULTURE, BLOOD (ROUTINE X 2)  CULTURE, BLOOD (ROUTINE X 2)   AEROBIC/ANAEROBIC CULTURE W GRAM STAIN (SURGICAL/DEEP WOUND)  SARS CORONAVIRUS 2 (TAT 6-24 HRS)  LACTIC ACID, PLASMA  LACTIC ACID, PLASMA  SEDIMENTATION RATE   ____________________________________________  EKG  None - EKG not ordered by ED physician ____________________________________________  RADIOLOGY  No indication for emergent imaging  Official radiology report(s): No results found.  ____________________________________________   PROCEDURES   Procedure(s) performed (including Critical Care):  Procedures   ____________________________________________   INITIAL IMPRESSION / MDM / [place] / ED COURSE  As part of my medical decision making, I reviewed the following data within the [place] notes reviewed and incorporated, Labs reviewed , Old chart reviewed, Discussed with admitting physician  and reviewed Notes from prior ED visits   Differential diagnosis includes, but is not limited to, Vibrio vulnificus skin infection, metabolic or electrolyte abnormality, staph or strep cellulitis, herpetic skin infection, vasculitis.  Patient's vital signs are stable and within normal limits.  She has no systemic signs of infection including no suggestion of sepsis.  Her CBC is within normal limits with no leukocytosis.  Her basic metabolic panel is concerning for acute renal failure.  She has a baseline chronic kidney disease with a GFR of around 45, but today her creatinine is up to 2.56 and her GFR is down to 21.  This may be medication related or related to her rheumatological disease.  She says that she sees Dr. Holley Raring for nephrology.  She has not had any significant volume depletion recently and has not had any vomiting or diarrhea.  The rash on her leg and the lesions on her lower back are very concerning for Vibrio vulnificus infection based on her history of recent salt water contact and skin abrasion and erythematous and  vesicular/fluid-filled appearance of the rash which is not consistent with vasculitis.  She is immunocompromised on multiple immunosuppressive's and I believe that accounts for a lack of leukocytosis.  However she has had a rapid progression of the rash over the last 2 days and I am concerned that she has the potential to develop into a much worse septic condition including necrotizing fasciitis if not treated  aggressively at least until the infection can be ruled out.  A more benign condition such as vasculitis can be managed more conservatively.  Additionally she needs admission for her acute on chronic renal failure.  Does not know the date of her last tetanus vaccination so I am giving her a Tdap.  As per recommended treatment for Vibrio vulnificus, I ordered ceftazidime 2 g IV and doxycycline 100 mg p.o.  The appropriate empiric dosing of Vibrio vulnificus should be doxycycline 100 mg p.o. or IV twice daily and (ceftazidime 2 g IV every 8 hours or ceftriaxone 1 g IV daily).  I will attempt to send a wound culture by a deroofing one of the vesicular lesions.  Blood cultures are pending as well.  I ordered lactated Ringer's 1 L IV bolus.  I will contact the hospitalist for admission.  If there is a staph or strep component of the skin rash, the doxycycline and cephalosporin should cover this as well.  No mucosal involvement and no concern for SJS/TEN.       Clinical Course as of 11/17/20 [id number]  Nancy Fetter Nov 17, 2020  0711 Discussed case by phone with Dr. Florina Ou with the hospitalist service who will admit. 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 She was evaluated in the context of the global COVID-19 pandemic, which necessitated consideration that the patient might be at risk for infection with the SARS-CoV-2 virus that causes COVID-19. Institutional protocols and algorithms that pertain to the evaluation of patients at risk for COVID-19 are in a state of rapid change based on information released by regulatory bodies including the CDC and federal and state organizations. These policies and algorithms were followed during the patient's care in the ED.  Some ED evaluations and interventions may be delayed as a result of limited staffing during and after the pandemic.*  Note:  This document was prepared using Dragon voice recognition software and may include unintentional dictation errors.   Hinda Kehr, MD 11/17/20 [phone]

## 2020-11-17 NOTE — H&P (Addendum)
History and Physical    Tina Page OER:[id number] DOB: February 20, 1956 DOA: 11/17/2020  PCP: Frazier Richards, MD    Patient coming from:  Home    Chief Complaint:  Left leg abrasion   HPI: Tina Page is a 65 y.o. female seen in ed with complaints of left leg abrasion and rash on lower legs since returning to [name] from [place] a week ago and rash starting 3 days ago. Pt reports that she [place] and that the rash has progressed to her lower legs and back. It is itchy and red and raised and tender, patient reports the pain is 8 out of 10, it is constant, it is worse with walking and trying to move her toes and putting weight on her leg.  Tylenol helps the pain but not completely.  The rash patient notes started on Friday and she fell on Thursday and she arrived in the [name] on Saturday.  Patient does have bilateral iliac stents for her PAD and she has been compliant with her medications and has not smoked since 2017 since she had the stents.  No history of shingles.  Patient is currently receiving treatment for hep C.  Patient has newly established with a rheumatologist locally Dr. Posey Pronto.  Also discussed with patient about her anemia patient states that she has had a colonoscopy where she had a polyp removed also an endoscopy and has not seen GI since 2017.  Patient is allergic to NSAIDs.  Patient has been on prednisone therapy since October. Informed patient that her kidney functions have declined further and probably due to the Bactrim that she is taking.  And that we will hold her Bactrim and any other nephrotoxic meds until her kidney function returns to baseline.  Patient verbalized understanding of the plan and agrees for admission.  Patient also reports that she sees the vascular doctor who checks blood pressure and all her toes and her feet and her next appointment is in the next few weeks.   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  Hypertension, history of tobacco abuse, PAD.   ED Course:  Vitals:   11/17/20 0249 11/17/20 0700 11/17/20 0750 11/17/20 0805  BP: (!) 156/63 108/66 (!) 125/99 (!) 143/82  Pulse: 96 84 84 87  Resp: 17 18  18   Temp: 97.9 F (36.6 C)     SpO2: 98% 100% 100% 100%  Weight: 81.6 kg     Height: 5' 3"  (1.6 m)     In the emergency room patient is alert awake afebrile, hypertensive oxygenating at 98% on room air.  Patient gives history no family at bedside.  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  CMP today shows an elevated creatinine of 2.56 with a patient's baseline of 1.30.  CBC shows normal white count of 4.3, hemoglobin of 11.5, platelet counts of 127.  ESR today is 21.  Covid test is pending.  Wound culture has been obtained in the emergency room by EDMD.  Discussed with the patient that this is most likely vasculitis however infection is also a likely differential, and we will empirically continue her antibiotic regimen.  Review of Systems:  Review of Systems  Constitutional: Negative.   HENT: Negative.   Eyes: Negative.   Respiratory: Negative.   Cardiovascular: Negative.   Gastrointestinal: Negative.   Genitourinary: Negative.   Musculoskeletal: Positive for  joint pain.  Skin: Positive for itching and rash.  Neurological: Negative.   Endo/Heme/Allergies: Negative.   Psychiatric/Behavioral: Negative.      Past Medical History:  Diagnosis Date  . Anemia   . Atherosclerosis of native arteries of extremity with intermittent claudication ([place]) 07/20/2016  . COPD (chronic obstructive pulmonary disease) ([place])   . 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 Elevated liver function tests 11/03/2017  . GERD (gastroesophageal reflux disease)   . Heme positive stool 01/29/2015  . Hepatitis C 07/12/2016  . HLD (hyperlipidemia)   . Hypertension   . Hypothyroidism   . Mass of middle  lobe of right lung 11/23/2017  . 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 Thrombocytopenia ([place]) 10/04/2016    Past Surgical History:  Procedure Laterality Date  . ELECTROMAGNETIC NAVIGATION BROCHOSCOPY N/A 12/07/2017   Procedure: ELECTROMAGNETIC NAVIGATION BRONCHOSCOPY;  Surgeon: Flora Lipps, MD;  Location: [facility] ORS;  Service: Cardiopulmonary;  Laterality: N/A;  . ESOPHAGOGASTRODUODENOSCOPY (EGD) WITH PROPOFOL N/A 07/08/2016   Procedure: ESOPHAGOGASTRODUODENOSCOPY (EGD) WITH PROPOFOL;  Surgeon: Manya Silvas, MD;  Location: [facility] ENDOSCOPY;  Service: Endoscopy;  Laterality: N/A;  . KNEE SURGERY Right    repair of acl tear  . PERIPHERAL VASCULAR CATHETERIZATION N/A 07/10/2016   Procedure: Lower Extremity Angiography;  Surgeon: Katha Cabal, MD;  Location: [place] CV LAB;  Service: Cardiovascular;  Laterality: N/A;  . PERIPHERAL VASCULAR CATHETERIZATION N/A 07/10/2016   Procedure: Abdominal Aortogram w/Lower Extremity;  Surgeon: Katha Cabal, MD;  Location: [place] CV LAB;  Service: Cardiovascular;  Laterality: N/A;  . PERIPHERAL VASCULAR CATHETERIZATION  07/10/2016   Procedure: Lower Extremity Intervention;  Surgeon: Katha Cabal, MD;  Location: [place] CV LAB;  Service: Cardiovascular;;     reports that she quit smoking about 4 years ago. Her smoking use included cigarettes. She has a 58.75 pack-year smoking history. She has never used smokeless tobacco. She reports that she does not drink alcohol and does not use drugs.  Allergies  Allergen Reactions  . Ciprofloxacin Swelling    Facial swelling following single oral dose.   . Codeine Anaphylaxis  . Nsaids Other (See Comments)    PT HAS HEPC    Family History  Problem Relation Age of Onset  . Diabetes Mother   . Hypertension Mother   . Diabetes Maternal Grandfather   . Hypertension Maternal Grandfather   . Breast cancer Neg Hx     Prior to Admission medications   Medication Sig Start Date End Date Taking?  Authorizing Provider  albuterol (PROVENTIL HFA;VENTOLIN HFA) 108 (90 Base) MCG/ACT inhaler Inhale 2 puffs into the lungs every 6 (six) hours as needed for wheezing or shortness of breath.     [provider]  allopurinol (ZYLOPRIM) 100 MG tablet TAKE 1 TABLET BY MOUTH EVERY DAY Patient taking differently: Take 100 mg by mouth daily. 06/02/19   Lequita Asal, MD  clopidogrel (PLAVIX) 75 MG tablet Take 1 tablet (75 mg total) by mouth daily. 07/13/16   Theodoro Grist, MD  cyanocobalamin 500 MCG tablet Take 500 mcg by mouth daily.    [provider]  diclofenac Sodium (VOLTAREN) 1 % GEL Apply 2 g topically 4 (four) times daily. 07/18/20   Ezekiel Slocumb, DO  entecavir (BARACLUDE) 0.5 MG tablet Take by mouth. 06/26/20   [provider]  folic acid (FOLVITE) 1 MG tablet TAKE 1 TABLET (1 MG TOTAL) BY MOUTH DAILY. 08/29/18   Lequita Asal, MD  hydrochlorothiazide (HYDRODIURIL) 25 MG tablet  Take 25 mg by mouth daily. 02/25/17   [provider]  levothyroxine (SYNTHROID, LEVOTHROID) 75 MCG tablet Take 75 mcg by mouth daily before breakfast.  12/30/17   [provider]  lisinopril (PRINIVIL,ZESTRIL) 20 MG tablet Take 20 mg by mouth daily.  12/23/16   [provider]  mupirocin ointment (BACTROBAN) 2 % Apply topically 2 (two) times daily. Patient not taking: No sig reported 07/18/20   Nicole Kindred A, DO  omeprazole (PRILOSEC) 20 MG capsule Take 1 capsule (20 mg total) by mouth daily. 08/02/16   Fritzi Mandes, MD  potassium chloride (KLOR-CON) 20 MEQ packet Take 20 mEq by mouth daily.    [provider]  pravastatin (PRAVACHOL) 40 MG tablet Take 40 mg by mouth every evening.    [provider]  predniSONE (DELTASONE) 2.5 MG tablet Take 1 tablet (2.5 mg total) by mouth daily with breakfast. Taper as directed by MD. 10/10/20   Lequita Asal, MD  sulfamethoxazole-trimethoprim (BACTRIM DS) 800-160 MG tablet Take 1 tablet by mouth  3 (three) times a week. Take on Mondays, Wednesdays, Fridays 10/11/20   Lequita Asal, MD  Tiotropium Bromide Monohydrate (SPIRIVA RESPIMAT) 2.5 MCG/ACT AERS Inhale 2 puffs into the lungs daily for 1 day. 05/27/20 07/29/20  Martyn Ehrich, NP  Tiotropium Bromide-Olodaterol (STIOLTO RESPIMAT) 2.5-2.5 MCG/ACT AERS Inhale 2 puffs into the lungs daily. Patient not taking: Reported on 10/10/2020    [provider]    Physical Exam: Vitals:   11/17/20 0249 11/17/20 0700 11/17/20 0750 11/17/20 0805  BP: (!) 156/63 108/66 (!) 125/99 (!) 143/82  Pulse: 96 84 84 87  Resp: 17 18  18   Temp: 97.9 F (36.6 C)     SpO2: 98% 100% 100% 100%  Weight: 81.6 kg     Height: 5' 3"  (1.6 m)      Physical Exam Vitals and nursing note reviewed.  Constitutional:      General: She is not in acute distress.    Appearance: Normal appearance. She is not ill-appearing.  HENT:     Head: Normocephalic and atraumatic.     Right Ear: External ear normal.     Left Ear: External ear normal.     Nose: Nose normal.     Mouth/Throat:     Mouth: Mucous membranes are moist.  Eyes:     Extraocular Movements: Extraocular movements intact.     Pupils: Pupils are equal, round, and reactive to light.  Neck:     Vascular: No carotid bruit.  Cardiovascular:     Rate and Rhythm: Normal rate and regular rhythm.     Pulses:          Dorsalis pedis pulses are 1+ on the right side and 1+ on the left side.       Posterior tibial pulses are 2+ on the right side and 2+ on the left side.     Heart sounds: Normal heart sounds.  Pulmonary:     Effort: Pulmonary effort is normal.     Breath sounds: Normal breath sounds.  Abdominal:     General: Bowel sounds are normal. There is no distension.     Palpations: Abdomen is soft.     Tenderness: There is no abdominal tenderness. There is no guarding.  Musculoskeletal:     Right lower leg: No edema.     Left lower leg: No edema.  Lymphadenopathy:     Cervical: No  cervical adenopathy.  Skin:    General:  Skin is cool.     Findings: Erythema, lesion, petechiae and rash present. Rash is papular and vesicular. Rash is not crusting, macular, nodular, pustular, scaling or urticarial.     Nails: There is no clubbing.       Neurological:     General: No focal deficit present.     Mental Status: She is alert and oriented to person, place, and time.     Cranial Nerves: Cranial nerves are intact.     Motor: Motor function is intact.  Psychiatric:        Mood and Affect: Mood normal.        Behavior: Behavior normal.   Labs on Admission: I have personally reviewed following labs and imaging studies  No results for input(s): CKTOTAL, CKMB, TROPONINI in the last 72 hours. Lab Results  Component Value Date   WBC 4.3 11/17/2020   HGB 11.5 (L) 11/17/2020   HCT 36.1 11/17/2020   MCV 95.8 11/17/2020   PLT 127 (L) 11/17/2020    Recent Labs  Lab 11/17/20 0258  NA 138  K 4.8  CL 106  CO2 25  BUN 42*  CREATININE 2.56*  CALCIUM 9.0  GLUCOSE 102*   Lab Results  Component Value Date   CHOL 168 10/11/2019   HDL 36 (L) 10/11/2019   LDLCALC 104 (H) 10/11/2019   TRIG 160 (H) 10/11/2019   No results found for: DDIMER Invalid input(s): POCBNP   COVID-19 Labs Pending.   Lab Results  Component Value Date   [place] NEGATIVE 07/16/2020   [place] NEGATIVE 03/11/2020    Radiological Exams on Admission: None  EKG: Independently reviewed.  None.  Echocardiogram: None.   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  We will continue patient on her current antibiotic regimen.  AM MD to request dermatology consult as I suspect patient will need a skin biopsy to confirm the diagnosis. I do not suspect this is infectious related.  High likely differentials include cryoglobulinemia, small vessel vasculitis, antiphospholipid syndrome ,cholesterol emboli syndrome.  We will order sed rate CRP cryoglobulin level complement level.  Culture has been obtained by EDMD.  Left leg pain: As needed Tylenol, elevation, nonweightbearing. Vascular consult as needed.Pulses intact but pt does describe claudication symptoms.  Hypertension: Home regimen of lisinopril, HCTZ has been held secondary to acute kidney injury on chronic kidney disease. Treatment of blood pressure with scheduled hydralazine.  COPD: Stable, albuterol MDI as needed.  Tobacco abuse: Patient has quit smoking since 2017.  Atherosclerosis of lower extremity, PAD: Patient status post bilateral iliac stents, continue patient on Plavix, Pravachol.  Anemia/B12 deficiency/heme positive stool: I suspect patient has a combination of iron deficiency anemia due to chronic blood loss and anemia of chronic disease: We will obtain stool cards, and anemia panel. Type and screen, IV PPI therapy.  Thrombocytopenia: This is new since early March blood work this year. We will follow patient's platelet count and CBC  and if any bleeding bruising or further worsening of platelet count will discontinue any antiplatelet therapy.  Hypothyroidism: Patient continued on levothyroxine 75 mcg daily. We will check patient's free T4 and TSH levels.   Lupus: Patient to follow-up with her rheumatologist as scheduled.  Chronic viral hepatitis B: Patient continued on her [place]. We will follow LFTs and avoid  hepatotoxic meds.  AKI on CKD: Lab Results  Component Value Date   CREATININE 2.56 (H) 11/17/2020   CREATININE 1.30 (H) 10/31/2020   CREATININE 1.36 (H) 10/17/2020  cont ivf hydration. Avoid contrast studies.  We will follow levels.   DVT prophylaxis:  Heparin  Code Status:  Full Code   Family Communication:  Perkins,[name] (Daughter)  [phone] (Mobile)   Disposition Plan:  Home     Consults called:  none  Admission status: Inpatient.   Para Skeans MD [facility] [phone] How to contact the [facility] Attending or Consulting provider [place] or covering provider during after hours [place], for this patient.    1. Check the care team in [facility] and look for a) attending/consulting [place] provider listed and b) the [facility] team listed 2. Log into [email or web address] and use [place]'s universal password to access. If you do not have the password, please contact the hospital operator. 3. Locate the [facility] provider you are looking for under [facility] and page to a number that you can be directly reached. 4. If you still have difficulty reaching the provider, please page the [facility] (Director on Call) for the Hospitalists listed on amion for assistance. [email or web address] Password [facility] 11/17/2020, 8:45 AM

## 2020-11-17 NOTE — ED Triage Notes (Addendum)
Pt states that while in the [place] last week she fell in the sand and got an abrasion to the left leg. Pt states the it has gotten worse, red, and burning. Pt states similar area on her back. Pt states history of anemia Pt walked into triage, no deficits noted

## 2020-11-17 NOTE — ED Notes (Signed)
Upon assessment, patient's left foot and ankle noted to have red spots with blisters and open areas on left calf with pitting edema +1 on left ankle and foot.  2 open areas on mid sacrum. Areas noted to have eschar on the sacral areas.

## 2020-11-18 ENCOUNTER — Encounter: Payer: Self-pay | Admitting: Internal Medicine

## 2020-11-18 DIAGNOSIS — D591 Autoimmune hemolytic anemia, unspecified: Secondary | ICD-10-CM

## 2020-11-18 DIAGNOSIS — N179 Acute kidney failure, unspecified: Secondary | ICD-10-CM

## 2020-11-18 DIAGNOSIS — B029 Zoster without complications: Secondary | ICD-10-CM | POA: Diagnosis not present

## 2020-11-18 DIAGNOSIS — N189 Chronic kidney disease, unspecified: Secondary | ICD-10-CM

## 2020-11-18 DIAGNOSIS — D696 Thrombocytopenia, unspecified: Secondary | ICD-10-CM

## 2020-11-18 DIAGNOSIS — E039 Hypothyroidism, unspecified: Secondary | ICD-10-CM

## 2020-11-18 DIAGNOSIS — D5911 Warm autoimmune hemolytic anemia: Secondary | ICD-10-CM

## 2020-11-18 LAB — CBC
HCT: 34 % — ABNORMAL LOW (ref 36.0–46.0)
Hemoglobin: 10.9 g/dL — ABNORMAL LOW (ref 12.0–15.0)
MCH: 30.2 pg (ref 26.0–34.0)
MCHC: 32.1 g/dL (ref 30.0–36.0)
MCV: 94.2 fL (ref 80.0–100.0)
Platelets: 93 10*3/uL — ABNORMAL LOW (ref 150–400)
RBC: 3.61 MIL/uL — ABNORMAL LOW (ref 3.87–5.11)
RDW: 14.2 % (ref 11.5–15.5)
WBC: 3 10*3/uL — ABNORMAL LOW (ref 4.0–10.5)
nRBC: 0 % (ref 0.0–0.2)

## 2020-11-18 LAB — ENA+DNA/DS+ANTICH+CENTRO+JO...
Anti JO-1: <0.2 AI (ref 0.0–0.9)
Centromere Ab Screen: <0.2 AI (ref 0.0–0.9)
Chromatin Ab SerPl-aCnc: <0.2 AI (ref 0.0–0.9)
ENA SM Ab Ser-aCnc: <0.2 AI (ref 0.0–0.9)
Ribonucleic Protein: 1 AI — ABNORMAL HIGH (ref 0.0–0.9)
SSA (Ro) (ENA) Antibody, IgG: <0.2 AI (ref 0.0–0.9)
SSB (La) (ENA) Antibody, IgG: <0.2 AI (ref 0.0–0.9)
Scleroderma (Scl-70) (ENA) Antibody, IgG: <0.2 AI (ref 0.0–0.9)
ds DNA Ab: <1 IU/mL (ref 0–9)

## 2020-11-18 LAB — C3 COMPLEMENT: C3 Complement: 144 mg/dL (ref 82–167)

## 2020-11-18 LAB — BASIC METABOLIC PANEL
Anion gap: 5 (ref 5–15)
BUN: 25 mg/dL — ABNORMAL HIGH (ref 8–23)
CO2: 25 mmol/L (ref 22–32)
Calcium: 8.6 mg/dL — ABNORMAL LOW (ref 8.9–10.3)
Chloride: 110 mmol/L (ref 98–111)
Creatinine, Ser: 1.28 mg/dL — ABNORMAL HIGH (ref 0.44–1.00)
GFR, Estimated: 47 mL/min — ABNORMAL LOW (ref >60)
Glucose, Bld: 90 mg/dL (ref 70–99)
Potassium: 4.7 mmol/L (ref 3.5–5.1)
Sodium: 140 mmol/L (ref 135–145)

## 2020-11-18 LAB — RHEUMATOID FACTOR: Rheumatoid fact SerPl-aCnc: <10 IU/mL (ref <14.0)

## 2020-11-18 LAB — C-REACTIVE PROTEIN: CRP: 0.9 mg/dL (ref <1.0)

## 2020-11-18 LAB — CK: Total CK: 74 U/L (ref 38–234)

## 2020-11-18 LAB — C4 COMPLEMENT: Complement C4, Body Fluid: 34 mg/dL (ref 12–38)

## 2020-11-18 LAB — ANA W/REFLEX IF POSITIVE: Anti Nuclear Antibody (ANA): POSITIVE — AB

## 2020-11-18 MED ORDER — CLOPIDOGREL BISULFATE 75 MG PO TABS
75.0000 mg | ORAL_TABLET | Freq: Every day | ORAL | Status: DC
Start: 2020-11-19 — End: 2020-11-20
  Administered 2020-11-19 – 2020-11-20 (×2): 75 mg via ORAL
  Filled 2020-11-18 (×2): qty 1

## 2020-11-18 MED ORDER — PANTOPRAZOLE SODIUM 40 MG PO TBEC
40.0000 mg | DELAYED_RELEASE_TABLET | Freq: Every day | ORAL | Status: DC
  Administered 2020-11-18 – 2020-11-20 (×3): 40 mg via ORAL
  Filled 2020-11-18 (×3): qty 1

## 2020-11-18 MED ORDER — VALACYCLOVIR HCL 500 MG PO TABS
1000.0000 mg | ORAL_TABLET | Freq: Two times a day (BID) | ORAL | Status: DC
  Administered 2020-11-18: 1000 mg via ORAL
  Filled 2020-11-18 (×2): qty 2

## 2020-11-18 MED ORDER — ALLOPURINOL 100 MG PO TABS
100.0000 mg | ORAL_TABLET | Freq: Every day | ORAL | Status: DC
  Administered 2020-11-18 – 2020-11-20 (×3): 100 mg via ORAL
  Filled 2020-11-18 (×3): qty 1

## 2020-11-18 MED ORDER — VALACYCLOVIR HCL 500 MG PO TABS: 1000.0000 mg | ORAL_TABLET | Freq: Two times a day (BID) | ORAL | Status: DC

## 2020-11-18 MED ORDER — PRAVASTATIN SODIUM 20 MG PO TABS
40.0000 mg | ORAL_TABLET | Freq: Every evening | ORAL | Status: DC
  Administered 2020-11-18 – 2020-11-19 (×2): 40 mg via ORAL
  Filled 2020-11-18 (×2): qty 2

## 2020-11-18 MED ORDER — LEVOTHYROXINE SODIUM 50 MCG PO TABS
75.0000 ug | ORAL_TABLET | Freq: Every day | ORAL | Status: DC
  Administered 2020-11-19 – 2020-11-20 (×2): 75 ug via ORAL
  Filled 2020-11-18 (×2): qty 1

## 2020-11-18 MED ORDER — ALBUTEROL SULFATE HFA 108 (90 BASE) MCG/ACT IN AERS
2.0000 | INHALATION_SPRAY | Freq: Four times a day (QID) | RESPIRATORY_TRACT | Status: DC | PRN
  Filled 2020-11-18: qty 6.7

## 2020-11-18 MED ORDER — HYDROXYCHLOROQUINE SULFATE 200 MG PO TABS
200.0000 mg | ORAL_TABLET | Freq: Two times a day (BID) | ORAL | Status: DC
  Administered 2020-11-18 – 2020-11-20 (×4): 200 mg via ORAL
  Filled 2020-11-18 (×4): qty 1

## 2020-11-18 MED ORDER — VALACYCLOVIR HCL 500 MG PO TABS
1000.0000 mg | ORAL_TABLET | Freq: Three times a day (TID) | ORAL | Status: DC
  Filled 2020-11-18 (×2): qty 2

## 2020-11-18 MED ORDER — ENTECAVIR 0.5 MG PO TABS: 0.5000 mg | ORAL_TABLET | ORAL | Status: DC

## 2020-11-18 MED ORDER — PREDNISONE 2.5 MG PO TABS
2.5000 mg | ORAL_TABLET | ORAL | Status: DC
  Administered 2020-11-18 – 2020-11-20 (×2): 2.5 mg via ORAL
  Filled 2020-11-18 (×2): qty 1

## 2020-11-18 NOTE — Plan of Care (Signed)

## 2020-11-18 NOTE — Progress Notes (Signed)
Patient ID: Tina Page, female   DOB: 01-Oct-1955, 65 y.o.   MRN: [id number] [facility] PROGRESS NOTE  Tina Page QVZ:[id number] DOB: [birth year] DOA: 11/17/2020 PCP: Frazier Richards, MD  HPI/Subjective: Patient with sensitivity on her leg.  Patient was in the [place] and had a fall in the ocean and was getting up and they were shells in the sand.  Patient had some blistering lesions.  Objective: Vitals:   11/18/20 1208 11/18/20 1632  BP: (!) 118/56 137/66  Pulse: 94 85  Resp: 16 18  Temp: 98.6 F (37 C) 99.7 F (37.6 C)  SpO2: 100% 100%    Intake/Output Summary (Last 24 hours) at 11/18/2020 1650 Last data filed at 11/18/2020 0300 Gross per 24 hour  Intake 2206.11 ml  Output -  Net 2206.11 ml   Filed Weights   11/17/20 0249  Weight: 81.6 kg    ROS: Review of Systems  Respiratory: Negative for shortness of breath.   Cardiovascular: Negative for chest pain.  Gastrointestinal: Negative for abdominal pain, nausea and vomiting.   Exam: Physical Exam HENT:     Head: Normocephalic.     Mouth/Throat:     Pharynx: No oropharyngeal exudate.  Eyes:     General: Lids are normal.     Conjunctiva/sclera: Conjunctivae normal.     Pupils: Pupils are equal, round, and reactive to light.  Cardiovascular:     Rate and Rhythm: Normal rate and regular rhythm.     Heart sounds: Normal heart sounds, S1 normal and S2 normal.  Pulmonary:     Breath sounds: Normal breath sounds. No decreased breath sounds, wheezing, rhonchi or rales.  Abdominal:     Palpations: Abdomen is soft.     Tenderness: There is no abdominal tenderness.  Musculoskeletal:     Right lower leg: Swelling present.     Left lower leg: Swelling present.  Skin:    General: Skin is warm.  Neurological:     Mental Status: She is alert and oriented to person, place, and time.           Data Reviewed: Basic Metabolic Panel: Recent Labs  Lab 11/17/20 0258 11/18/20 0407  NA 138 140  K 4.8 4.7   CL 106 110  CO2 25 25  GLUCOSE 102* 90  BUN 42* 25*  CREATININE 2.56* 1.28*  CALCIUM 9.0 8.6*   CBC: Recent Labs  Lab 11/17/20 0258 11/18/20 0407  WBC 4.3 3.0*  NEUTROABS 2.5  --   HGB 11.5* 10.9*  HCT 36.1 34.0*  MCV 95.8 94.2  PLT 127* 93*   Cardiac Enzymes: Recent Labs  Lab 11/18/20 0407  CKTOTAL 74     Recent Results (from the past 240 hour(s))  Blood Culture (routine x 2)     Status: None (Preliminary result)   Collection Time: 11/17/20  7:22 AM   Specimen: BLOOD  Result Value Ref Range Status   Specimen Description BLOOD BLOOD LEFT HAND  Final   Special Requests   Final    BOTTLES DRAWN AEROBIC AND ANAEROBIC Blood Culture results may not be optimal due to an inadequate volume of blood received in culture bottles   Culture   Final    NO GROWTH < 24 HOURS Performed at [facility], [address] [place], [place] [address]    Report Status PENDING  Incomplete  Blood Culture (routine x 2)     Status: None (Preliminary result)   Collection Time: 11/17/20  7:22 AM  Specimen: BLOOD  Result Value Ref Range Status   Specimen Description BLOOD BLOOD RIGHT HAND  Final   Special Requests   Final    BOTTLES DRAWN AEROBIC AND ANAEROBIC Blood Culture results may not be optimal due to an inadequate volume of blood received in culture bottles   Culture   Final    NO GROWTH < 24 HOURS Performed at [facility], [address], [place], [place] [address]    Report Status PENDING  Incomplete  Aerobic/Anaerobic Culture w Gram Stain (surgical/deep wound)     Status: None (Preliminary result)   Collection Time: 11/17/20  7:22 AM   Specimen: Foot; Wound  Result Value Ref Range Status   Specimen Description   Final    FOOT LEFT FOOT Performed at [facility], [address] [place], [place] [address]    Special Requests   Final    Immunocompromised Performed at [facility], [place], [place] [address]    Gram  Stain NO WBC SEEN NO ORGANISMS SEEN   Final   Culture   Final    NO GROWTH < 24 HOURS Performed at [facility], [place] [address] [place], [place] [address]    Report Status PENDING  Incomplete  SARS CORONAVIRUS 2 (TAT 6-24 HRS) Nasopharyngeal Nasopharyngeal Swab     Status: None   Collection Time: 11/17/20  7:22 AM   Specimen: Nasopharyngeal Swab  Result Value Ref Range Status   SARS Coronavirus 2 NEGATIVE NEGATIVE Final    Comment: (NOTE) SARS-CoV-2 target nucleic acids are NOT DETECTED.  The SARS-CoV-2 RNA is generally detectable in upper and lower respiratory specimens during the acute phase of infection. Negative results do not preclude SARS-CoV-2 infection, do not rule out co-infections with other pathogens, and should not be used as the sole basis for treatment or other patient management decisions. Negative results must be combined with clinical observations, patient history, and epidemiological information. The expected result is Negative.  Fact Sheet for Patients: SugarRoll.be  Fact Sheet for Healthcare Providers: https://www.woods-mathews.com/  This test is not yet approved or cleared by the [place] FDA and  has been authorized for detection and/or diagnosis of SARS-CoV-2 by FDA under an Emergency Use Authorization (EUA). This EUA will remain  in effect (meaning this test can be used) for the duration of the COVID-19 declaration under Se ction 564(b)(1) of the Act, 21 U.S.C. section 360bbb-3(b)(1), unless the authorization is terminated or revoked sooner.  Performed at [facility], [place] [address], [place], [place] [address]      Studies: No results found.  Scheduled Meds: . allopurinol  100 mg Oral Daily  . [START ON 11/19/2020] clopidogrel  75 mg Oral Daily  . doxycycline  100 mg Oral Q12H  . entecavir  0.5 mg Oral QODAY  . heparin  5,000 Units Subcutaneous Q8H  . hydrALAZINE  50 mg Oral Q8H  .  hydroxychloroquine  200 mg Oral BID  . [START ON 11/19/2020] levothyroxine  75 mcg Oral QAC breakfast  . pantoprazole  40 mg Oral Daily  . pantoprazole (PROTONIX) IV  40 mg Intravenous Q24H  . pravastatin  40 mg Oral QPM  . [START ON 11/19/2020] predniSONE  2.5 mg Oral QODAY  . sodium chloride flush  3 mL Intravenous Q12H  . valACYclovir  1,000 mg Oral TID   Continuous Infusions: . sodium chloride 50 mL/hr at 11/18/20 1550    Assessment/Plan:  1. Shingles.  With  kidney function improved.  Valtrex started.  Droplet precautions.  Case discussed with ID and she will discontinue IV antibiotics and continue doxycycline for now. 2. Acute kidney injury on chronic kidney disease stage IIIa.  Decrease rate of IV fluids.  Creatinine improved from 2.56 on admission down to 1.28. 3. History of warm autoimmune hemolytic anemia, thrombocytopenia.  Patient on low-dose prednisone.  Holding PCP prophylaxis 4. Hepatitis B on entecavir every other day 5. B12 deficiency 6. Essential hypertension.  Hold hydralazine for right now 7. Hypothyroidism unspecified on levothyroxine        Code Status:     Code Status Orders  (From admission, onward)         Start     Ordered   11/17/20 0855  Full code  Continuous        11/17/20 0901        Code Status History    Date Active Date Inactive Code Status Order ID Comments User Context   07/16/2020 1355 07/18/2020 1811 Full Code [id number]  Collier Bullock, MD ED   08/01/2016 1718 08/02/2016 1519 Full Code [id number]  Nicholes Mango, MD Inpatient   07/21/2016 1722 07/23/2016 1355 Full Code [id number]  Lytle Butte, MD ED   07/07/2016 2229 07/12/2016 1451 Full Code [id number]  Lance Coon, MD ED   Advance Care Planning Activity     Family Communication: Spoke with daughter on the phone Disposition Plan: Status is: Inpatient  Dispo: The patient is from: Home              Anticipated d/c is to: Home              Patient currently switched treatment over  to Valtrex for shingles   Difficult to place patient.  No.  Time spent: 28 minutes, case discussed with infectious disease  [employer]

## 2020-11-18 NOTE — Consult Note (Signed)
NAME: Tina Page  DOB: Dec 07, 1955  MRN: [id number]  Date/Time: 11/18/2020 12:02 PM  REQUESTING PROVIDER: Dr. Leslye Peer Subjective:  REASON FOR CONSULT: Rash leg ? 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  As per patient she was in [place] last week, had been on a cruise and then stayed in [place] for 1 day, was in the water , lost balance  she fell  and got up , she did not notice any wound or bruise. She did not fall on any coral reef, no scratches or bleeding immediately after.no bites,   In a day or so she noted small spots on her back and it was painful       And then it involved the left leg and she came to the hospital No fever or chills   Past Medical History:  Diagnosis Date  . Anemia   . Atherosclerosis of native arteries of extremity with intermittent claudication ([place]) 07/20/2016  . COPD (chronic obstructive pulmonary disease) ([place])   . 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 Elevated liver function tests 11/03/2017  . GERD (gastroesophageal reflux disease)   . Heme positive stool 01/29/2015  . Hepatitis B core antibody positive 07/12/2016  . HLD (hyperlipidemia)   . Hypertension   . Hypothyroidism   . 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 Thrombocytopenia ([place]) 10/04/2016    Past Surgical History:  Procedure Laterality Date  . ELECTROMAGNETIC NAVIGATION BROCHOSCOPY N/A 12/07/2017   Procedure: ELECTROMAGNETIC NAVIGATION BRONCHOSCOPY;  Surgeon: Flora Lipps, MD;  Location: [facility] ORS;  Service: Cardiopulmonary;  Laterality: N/A;  . ESOPHAGOGASTRODUODENOSCOPY (EGD) WITH PROPOFOL N/A 07/08/2016   Procedure: ESOPHAGOGASTRODUODENOSCOPY (EGD) WITH PROPOFOL;  Surgeon:  Manya Silvas, MD;  Location: [facility] ENDOSCOPY;  Service: Endoscopy;  Laterality: N/A;  . KNEE SURGERY Right    repair of acl tear  . PERIPHERAL VASCULAR CATHETERIZATION N/A 07/10/2016   Procedure: Lower Extremity Angiography;  Surgeon: Katha Cabal, MD;  Location: [place] [facility];  Service: Cardiovascular;  Laterality: N/A;  . PERIPHERAL VASCULAR CATHETERIZATION N/A 07/10/2016   Procedure: Abdominal Aortogram w/Lower Extremity;  Surgeon: Katha Cabal, MD;  Location: [place] [facility];  Service: Cardiovascular;  Laterality: N/A;  . PERIPHERAL VASCULAR CATHETERIZATION  07/10/2016   Procedure: Lower Extremity Intervention;  Surgeon: Katha Cabal, MD;  Location: [place] [facility];  Service: Cardiovascular;;    Social History   Socioeconomic History  . Marital status: Widowed    Spouse name: Not on file  . Number of children: Not on file  . Years of education: Not on file  . Highest education level: Not on file  Occupational History  . Not on file  Tobacco Use  . Smoking status: Former Smoker    Packs/day: 1.25    Years: 47.00    Pack years: 58.75    Types: Cigarettes    Quit date: 07/11/2016    Years since quitting: 4.3  . Smokeless tobacco: Never Used  Vaping Use  . Vaping Use: Never used  Substance and Sexual Activity  . Alcohol use: No  . Drug use: No  . Sexual activity: Not Currently    Birth control/protection: None  Other Topics Concern  .  Not on file  Social History Narrative  . Not on file   Social Determinants of Health   Financial Resource Strain: Not on file  Food Insecurity: Not on file  Transportation Needs: Not on file  Physical Activity: Not on file  Stress: Not on file  Social Connections: Not on file  Intimate Partner Violence: Not on file    Family History  Problem Relation Age of Onset  . Diabetes Mother   . Hypertension Mother   . Diabetes Maternal Grandfather   . Hypertension Maternal Grandfather   . Breast cancer  Neg Hx    Allergies  Allergen Reactions  . Ciprofloxacin Swelling    Facial swelling following single oral dose.   . Codeine Anaphylaxis  . Nsaids Other (See Comments)    PT HAS HEPC   I? Current Facility-Administered Medications  Medication Dose Route Frequency Provider Last Rate Last Admin  . 0.9 %  sodium chloride infusion   Intravenous Continuous Para Skeans, MD 100 mL/hr at 11/18/20 0823 New Bag at 11/18/20 1751  . acetaminophen (TYLENOL) tablet 650 mg  650 mg Oral Q6H PRN Para Skeans, MD   650 mg at 11/18/20 0813   Or  . acetaminophen (TYLENOL) suppository 650 mg  650 mg Rectal Q6H PRN Para Skeans, MD      . cefTAZidime (FORTAZ) 1 g in sodium chloride 0.9 % 100 mL IVPB  1 g Intravenous Q12H Florina Ou V, MD 200 mL/hr at 11/18/20 0825 1 g at 11/18/20 0825  . doxycycline (VIBRA-TABS) tablet 100 mg  100 mg Oral Q12H Florina Ou V, MD   100 mg at 11/18/20 0813  . heparin injection 5,000 Units  5,000 Units Subcutaneous Q8H Para Skeans, MD   5,000 Units at 11/18/20 0258  . hydrALAZINE (APRESOLINE) tablet 50 mg  50 mg Oral Q8H Para Skeans, MD   50 mg at 11/18/20 5277  . ondansetron (ZOFRAN) tablet 4 mg  4 mg Oral Q6H PRN Para Skeans, MD       Or  . ondansetron (ZOFRAN) injection 4 mg  4 mg Intravenous Q6H PRN Para Skeans, MD      . pantoprazole (PROTONIX) injection 40 mg  40 mg Intravenous Q24H Para Skeans, MD   40 mg at 11/18/20 8242  . sodium chloride flush (NS) 0.9 % injection 3 mL  3 mL Intravenous Q12H Para Skeans, MD   3 mL at 11/18/20 0813  . Tdap (BOOSTRIX) injection 0.5 mL  0.5 mL Intramuscular Once Hinda Kehr, MD      . valACYclovir Estell Harpin) tablet 1,000 mg  1,000 mg Oral BID Loletha Grayer, MD         Abtx:  Anti-infectives (From admission, onward)   Start     Dose/Rate Route Frequency Ordered Stop   11/18/20 1000  valACYclovir (VALTREX) tablet 1,000 mg        1,000 mg Oral 2 times daily 11/18/20 0847     11/18/20 0800  cefTAZidime (FORTAZ) 1 g in  sodium chloride 0.9 % 100 mL IVPB  Status:  Discontinued        1 g 200 mL/hr over 30 Minutes Intravenous Every 24 hours 11/17/20 0852 11/17/20 1054   11/17/20 2200  doxycycline (VIBRA-TABS) tablet 100 mg        100 mg Oral Every 12 hours 11/17/20 0848     11/17/20 1145  cefTAZidime (FORTAZ) 1 g in sodium chloride 0.9 % 100 mL IVPB  1 g 200 mL/hr over 30 Minutes Intravenous Every 12 hours 11/17/20 1054     11/17/20 1000  cefTAZidime (FORTAZ) 1 g in sodium chloride 0.9 % 100 mL IVPB  Status:  Discontinued        1 g 200 mL/hr over 30 Minutes Intravenous Every 12 hours 11/17/20 0848 11/17/20 0851   11/17/20 0630  doxycycline (VIBRA-TABS) tablet 100 mg        100 mg Oral  Once 11/17/20 5916 11/17/20 0752   11/17/20 0630  cefTAZidime (FORTAZ) 2 g in sodium chloride 0.9 % 100 mL IVPB        2 g 200 mL/hr over 30 Minutes Intravenous  Once 11/17/20 3846 11/17/20 0850      REVIEW OF SYSTEMS:  Const: negative fever, negative chills, negative weight loss Eyes: negative diplopia or visual changes, negative eye pain ENT: negative coryza, negative sore throat Resp: negative cough, hemoptysis, dyspnea Cards: negative for chest pain, palpitations, lower extremity edema GU: negative for frequency, dysuria and hematuria GI: Negative for abdominal pain, diarrhea, bleeding, constipation Skin: negative for rash and pruritus Heme: negative for easy bruising and gum/nose bleeding MS: left leg pain Neurolo:negative for headaches, dizziness, vertigo, memory problems  Psych: negative for feelings of anxiety, depression  Endocrine: negative for thyroid, diabetes Allergy/Immunology-as above?  Objective:  VITALS:  BP (!) 117/57 (BP Location: Right Arm)   Pulse 89   Temp 99 F (37.2 C)   Resp 16   Ht 5\' 3"  (1.6 m)   Wt 81.6 kg   SpO2 100%   BMI 31.89 kg/m  PHYSICAL EXAM:  General: Alert, cooperative, no distress, appears stated age.  Head: Normocephalic, without obvious abnormality,  atraumatic. Eyes: Conjunctivae clear, anicteric sclerae. Pupils are equal ENT Nares normal. No drainage or sinus tenderness. Lips, mucosa, and tongue normal. No Thrush, eduntulous Neck: Supple, symmetrical, no adenopathy, thyroid: non tender no carotid bruit and no JVD. Back: No CVA tenderness. Lungs: Clear to auscultation bilaterally. No Wheezing or Rhonchi. No rales. Heart: Regular rate and rhythm, no murmur, rub or gallop. Abdomen: Soft, non-tender,not distended. Bowel sounds normal. No masses Extremities: 2 small pustular, ischemic lesion on the left buttock area Multiple erythematous lesions in crops and linearly arranged over the left leg Tender to touch      Skin: No rashes or lesions. Or bruising Lymph: Cervical, supraclavicular normal. Neurologic: Grossly non-focal Pertinent Labs Lab Results CBC    Component Value Date/Time   WBC 3.0 (L) 11/18/2020 0407   RBC 3.61 (L) 11/18/2020 0407   HGB 10.9 (L) 11/18/2020 0407   HGB 9.0 (L) 02/18/2018 1204   HCT 34.0 (L) 11/18/2020 0407   HCT 46.5 06/11/2014 1732   PLT 93 (L) 11/18/2020 0407   PLT 127 (L) 06/11/2014 1732   MCV 94.2 11/18/2020 0407   MCV 92 06/11/2014 1732   MCH 30.2 11/18/2020 0407   MCHC 32.1 11/18/2020 0407   RDW 14.2 11/18/2020 0407   RDW 14.5 06/11/2014 1732   LYMPHSABS 1.0 11/17/2020 0258   MONOABS 0.6 11/17/2020 0258   EOSABS 0.1 11/17/2020 0258   BASOSABS 0.1 11/17/2020 0258    CMP Latest Ref Rng & Units 11/18/2020 11/17/2020 10/31/2020  Glucose 70 - 99 mg/dL 90 102(H) 102(H)  BUN 8 - 23 mg/dL 25(H) 42(H) 26(H)  Creatinine 0.44 - 1.00 mg/dL 1.28(H) 2.56(H) 1.30(H)  Sodium 135 - 145 mmol/L 140 138 138  Potassium 3.5 - 5.1 mmol/L 4.7 4.8 3.7  Chloride 98 - 111 mmol/L 110 106  102  CO2 22 - 32 mmol/L 25 25 23   Calcium 8.9 - 10.3 mg/dL 8.6(L) 9.0 9.1  Total Protein 6.5 - 8.1 g/dL - - -  Total Bilirubin 0.3 - 1.2 mg/dL - - -  Alkaline Phos 38 - 126 U/L - - -  AST 15 - 41 U/L - - -  ALT 0 - 44 U/L -  - -      Microbiology: Recent Results (from the past 240 hour(s))  Blood Culture (routine x 2)     Status: None (Preliminary result)   Collection Time: 11/17/20  7:22 AM   Specimen: BLOOD  Result Value Ref Range Status   Specimen Description BLOOD BLOOD LEFT HAND  Final   Special Requests   Final    BOTTLES DRAWN AEROBIC AND ANAEROBIC Blood Culture results may not be optimal due to an inadequate volume of blood received in culture bottles   Culture   Final    NO GROWTH < 24 HOURS Performed at [facility], [address], [place], [place] [address]    Report Status PENDING  Incomplete  Blood Culture (routine x 2)     Status: None (Preliminary result)   Collection Time: 11/17/20  7:22 AM   Specimen: BLOOD  Result Value Ref Range Status   Specimen Description BLOOD BLOOD RIGHT HAND  Final   Special Requests   Final    BOTTLES DRAWN AEROBIC AND ANAEROBIC Blood Culture results may not be optimal due to an inadequate volume of blood received in culture bottles   Culture   Final    NO GROWTH < 24 HOURS Performed at [facility], [address] [place], [place] [address]    Report Status PENDING  Incomplete  Aerobic/Anaerobic Culture w Gram Stain (surgical/deep wound)     Status: None (Preliminary result)   Collection Time: 11/17/20  7:22 AM   Specimen: Foot; Wound  Result Value Ref Range Status   Specimen Description   Final    FOOT LEFT FOOT Performed at [facility], [address] [place], [place] [address]    Special Requests   Final    Immunocompromised Performed at [facility], [place], [place] [address]    Gram Stain NO WBC SEEN NO ORGANISMS SEEN   Final   Culture   Final    NO GROWTH < 24 HOURS Performed at [facility], [place] [address] [place], [place] [address]    Report Status PENDING  Incomplete  SARS CORONAVIRUS 2 (TAT 6-24 HRS) Nasopharyngeal Nasopharyngeal Swab     Status: None   Collection  Time: 11/17/20  7:22 AM   Specimen: Nasopharyngeal Swab  Result Value Ref Range Status   SARS Coronavirus 2 NEGATIVE NEGATIVE Final    Comment: (NOTE) SARS-CoV-2 target nucleic acids are NOT DETECTED.  The SARS-CoV-2 RNA is generally detectable in upper and lower respiratory specimens during the acute phase of infection. Negative results do not preclude SARS-CoV-2 infection, do not rule out co-infections with other pathogens, and should not be used as the sole basis for treatment or other patient management decisions. Negative results must be combined with clinical observations, patient history, and epidemiological information. The expected result is Negative.  Fact Sheet for Patients: SugarRoll.be  Fact Sheet for Healthcare Providers: https://www.woods-mathews.com/  This test is not yet approved or cleared by the [place] FDA and  has been authorized for detection and/or diagnosis of SARS-CoV-2 by FDA under an Emergency Use  Authorization (EUA). This EUA will remain  in effect (meaning this test can be used) for the duration of the COVID-19 declaration under Se ction 564(b)(1) of the Act, 21 U.S.C. section 360bbb-3(b)(1), unless the authorization is terminated or revoked sooner.  Performed at [facility], [place] [address] [place], [place] [address]     IMAGING RESULTS: none I have personally reviewed the films ? Impression/Recommendation ? ?Linear painful erythematous lesions over the left leg below knee and also 2 small lesions left buttock area Tender D.D Herpes zoster- pt is immunocompromised with rituxomab, and at risk and can present with atypical zoster ?? EM  ? vasculitis This is not vibrio vulnificus Agree with starting valcicyclovir PCR sent from the wound swab Can DC ceftazidime- continue doxy to prevent superadded bacterial infection  AKI on CKD- agree with holding bactrim- cr improved- dose adjust  valtrex  Autoimmune hemolytic anemia- followed by heme as OP- last dose on rituximab in Dec 2021  Thrombocytopenia HEP B c ab positive on entecavir for empiric treatment because of rituximab  Undifferentiated CTD with positive ANA- on plaquenil   ___________________________________________________ Discussed with patient, requesting provider Note:  This document was prepared using Dragon voice recognition software and may include unintentional dictation errors.

## 2020-11-19 DIAGNOSIS — M79662 Pain in left lower leg: Secondary | ICD-10-CM

## 2020-11-19 DIAGNOSIS — B029 Zoster without complications: Secondary | ICD-10-CM | POA: Diagnosis not present

## 2020-11-19 DIAGNOSIS — R509 Fever, unspecified: Secondary | ICD-10-CM | POA: Diagnosis not present

## 2020-11-19 LAB — CBC
HCT: 33.8 % — ABNORMAL LOW (ref 36.0–46.0)
Hemoglobin: 10.8 g/dL — ABNORMAL LOW (ref 12.0–15.0)
MCH: 30.2 pg (ref 26.0–34.0)
MCHC: 32 g/dL (ref 30.0–36.0)
MCV: 94.4 fL (ref 80.0–100.0)
Platelets: 103 10*3/uL — ABNORMAL LOW (ref 150–400)
RBC: 3.58 MIL/uL — ABNORMAL LOW (ref 3.87–5.11)
RDW: 14 % (ref 11.5–15.5)
WBC: 3.4 10*3/uL — ABNORMAL LOW (ref 4.0–10.5)
nRBC: 0 % (ref 0.0–0.2)

## 2020-11-19 LAB — BASIC METABOLIC PANEL
Anion gap: 4 — ABNORMAL LOW (ref 5–15)
BUN: 17 mg/dL (ref 8–23)
CO2: 25 mmol/L (ref 22–32)
Calcium: 9 mg/dL (ref 8.9–10.3)
Chloride: 110 mmol/L (ref 98–111)
Creatinine, Ser: 0.99 mg/dL (ref 0.44–1.00)
GFR, Estimated: >60 mL/min (ref >60)
Glucose, Bld: 92 mg/dL (ref 70–99)
Potassium: 4.3 mmol/L (ref 3.5–5.1)
Sodium: 139 mmol/L (ref 135–145)

## 2020-11-19 LAB — C-REACTIVE PROTEIN: CRP: 0.8 mg/dL (ref <1.0)

## 2020-11-19 MED ORDER — DOXYCYCLINE HYCLATE 100 MG PO TABS: 100.0000 mg | ORAL_TABLET | Freq: Two times a day (BID) | ORAL | 0 refills | Status: DC

## 2020-11-19 MED ORDER — SODIUM CHLORIDE 0.9 % IV SOLN: 2.0000 g | Freq: Two times a day (BID) | INTRAVENOUS | Status: DC

## 2020-11-19 MED ORDER — VALACYCLOVIR HCL 500 MG PO TABS
1000.0000 mg | ORAL_TABLET | Freq: Three times a day (TID) | ORAL | Status: DC
  Administered 2020-11-19: 1000 mg via ORAL
  Filled 2020-11-19 (×3): qty 2

## 2020-11-19 MED ORDER — GABAPENTIN 100 MG PO CAPS: 100.0000 mg | ORAL_CAPSULE | Freq: Two times a day (BID) | ORAL | 0 refills | Status: DC

## 2020-11-19 MED ORDER — CEFAZOLIN SODIUM-DEXTROSE 2-4 GM/100ML-% IV SOLN
2.0000 g | Freq: Three times a day (TID) | INTRAVENOUS | Status: DC
  Administered 2020-11-19 – 2020-11-20 (×2): 2 g via INTRAVENOUS
  Filled 2020-11-19 (×4): qty 100

## 2020-11-19 MED ORDER — CEFAZOLIN SODIUM-DEXTROSE 1-4 GM/50ML-% IV SOLN
1.0000 g | Freq: Three times a day (TID) | INTRAVENOUS | Status: DC
  Filled 2020-11-19 (×3): qty 50

## 2020-11-19 MED ORDER — VALACYCLOVIR HCL 1 G PO TABS: 1000.0000 mg | ORAL_TABLET | Freq: Three times a day (TID) | ORAL | 0 refills | Status: DC

## 2020-11-19 MED ORDER — VALACYCLOVIR HCL 500 MG PO TABS
1000.0000 mg | ORAL_TABLET | Freq: Two times a day (BID) | ORAL | Status: DC
  Administered 2020-11-19 – 2020-11-20 (×2): 1000 mg via ORAL
  Filled 2020-11-19 (×3): qty 2

## 2020-11-19 MED ORDER — CEFAZOLIN SODIUM-DEXTROSE 2-4 GM/100ML-% IV SOLN
2.0000 g | Freq: Three times a day (TID) | INTRAVENOUS | Status: DC
  Filled 2020-11-19 (×3): qty 100

## 2020-11-19 MED ORDER — GABAPENTIN 100 MG PO CAPS
100.0000 mg | ORAL_CAPSULE | Freq: Two times a day (BID) | ORAL | Status: DC
  Administered 2020-11-19 – 2020-11-20 (×3): 100 mg via ORAL
  Filled 2020-11-19 (×3): qty 1

## 2020-11-19 MED ORDER — PREDNISONE 2.5 MG PO TABS: 2.5000 mg | ORAL_TABLET | ORAL | Status: DC

## 2020-11-19 NOTE — Progress Notes (Addendum)
Patient ID: Tina Page, female   DOB: 11-04-1955, 65 y.o.   MRN: [id number] [facility] PROGRESS NOTE  Tina Page:[id number] DOB: 24-Feb-1956 DOA: 11/17/2020 PCP: Tina Richards, MD  HPI/Subjective: Patient after the fever did have some dizziness and headache.  Still has some left lower extremity pain and numbness.  Interested in starting gabapentin.  Admitted with a left lower extremity and buttock rash and diagnosed with shingles.  Objective: Vitals:   11/19/20 1151 11/19/20 1705  BP: (!) 111/93 (!) 131/57  Pulse: 87 83  Resp: 15   Temp: 98.4 F (36.9 C) 98.6 F (37 C)  SpO2: 97% 96%   No intake or output data in the 24 hours ending 11/19/20 1726 Filed Weights   11/17/20 0249  Weight: 81.6 kg    ROS: Review of Systems  Constitutional: Positive for fever.  Respiratory: Negative for shortness of breath.   Cardiovascular: Negative for chest pain.  Gastrointestinal: Negative for abdominal pain.  Musculoskeletal: Positive for joint pain.  Neurological: Positive for dizziness and headaches.   Exam: Physical Exam HENT:     Head: Normocephalic.     Mouth/Throat:     Pharynx: No oropharyngeal exudate.  Eyes:     General: Lids are normal.     Conjunctiva/sclera: Conjunctivae normal.     Pupils: Pupils are equal, round, and reactive to light.  Cardiovascular:     Rate and Rhythm: Normal rate and regular rhythm.     Heart sounds: Normal heart sounds, S1 normal and S2 normal.  Pulmonary:     Breath sounds: Normal breath sounds. No decreased breath sounds, wheezing, rhonchi or rales.  Abdominal:     Palpations: Abdomen is soft.     Tenderness: There is no abdominal tenderness.  Musculoskeletal:     Right ankle: No swelling.     Left ankle: No swelling.  Skin:    General: Skin is warm.     Comments: Shingles rash left lower extremity starting to dry up.  Shingles rash left buttock starting to scab over.  Neurological:     Mental Status: She is alert and  oriented to person, place, and time.       Data Reviewed: Basic Metabolic Panel: Recent Labs  Lab 11/17/20 0258 11/18/20 0407 11/19/20 0450  NA 138 140 139  K 4.8 4.7 4.3  CL 106 110 110  CO2 25 25 25   GLUCOSE 102* 90 92  BUN 42* 25* 17  CREATININE 2.56* 1.28* 0.99  CALCIUM 9.0 8.6* 9.0   CBC: Recent Labs  Lab 11/17/20 0258 11/18/20 0407 11/19/20 0450  WBC 4.3 3.0* 3.4*  NEUTROABS 2.5  --   --   HGB 11.5* 10.9* 10.8*  HCT 36.1 34.0* 33.8*  MCV 95.8 94.2 94.4  PLT 127* 93* 103*   Cardiac Enzymes: Recent Labs  Lab 11/18/20 0407  CKTOTAL 74     Recent Results (from the past 240 hour(s))  Blood Culture (routine x 2)     Status: None (Preliminary result)   Collection Time: 11/17/20  7:22 AM   Specimen: BLOOD  Result Value Ref Range Status   Specimen Description BLOOD BLOOD LEFT HAND  Final   Special Requests   Final    BOTTLES DRAWN AEROBIC AND ANAEROBIC Blood Culture results may not be optimal due to an inadequate volume of blood received in culture bottles   Culture   Final    NO GROWTH 2 DAYS Performed at [facility], [address] [place], [place] [address]    Report Status PENDING  Incomplete  Blood Culture (routine x 2)     Status: None (Preliminary result)   Collection Time: 11/17/20  7:22 AM   Specimen: BLOOD  Result Value Ref Range Status   Specimen Description BLOOD BLOOD RIGHT HAND  Final   Special Requests   Final    BOTTLES DRAWN AEROBIC AND ANAEROBIC Blood Culture results may not be optimal due to an inadequate volume of blood received in culture bottles   Culture   Final    NO GROWTH 2 DAYS Performed at [facility], [address] [place], [place] [address]    Report Status PENDING  Incomplete  Aerobic/Anaerobic Culture w Gram Stain (surgical/deep wound)     Status: None (Preliminary result)   Collection Time: 11/17/20  7:22 AM   Specimen: Foot; Wound  Result Value Ref Range Status   Specimen Description    Final    FOOT LEFT FOOT Performed at [facility], [address] [place], [place] [address]    Special Requests   Final    Immunocompromised Performed at [facility], [place], [place] [address]    Gram Stain NO WBC SEEN NO ORGANISMS SEEN   Final   Culture   Final    NO GROWTH 2 DAYS NO ANAEROBES ISOLATED; CULTURE IN PROGRESS FOR 5 DAYS Performed at [facility], [address], [place], [place] [address]    Report Status PENDING  Incomplete  SARS CORONAVIRUS 2 (TAT 6-24 HRS) Nasopharyngeal Nasopharyngeal Swab     Status: None   Collection Time: 11/17/20  7:22 AM   Specimen: Nasopharyngeal Swab  Result Value Ref Range Status   SARS Coronavirus 2 NEGATIVE NEGATIVE Final    Comment: (NOTE) SARS-CoV-2 target nucleic acids are NOT DETECTED.  The SARS-CoV-2 RNA is generally detectable in upper and lower respiratory specimens during the acute phase of infection. Negative results do not preclude SARS-CoV-2 infection, do not rule out co-infections with other pathogens, and should not be used as the sole basis for treatment or other patient management decisions. Negative results must be combined with clinical observations, patient history, and epidemiological information. The expected result is Negative.  Fact Sheet for Patients: SugarRoll.be  Fact Sheet for Healthcare Providers: https://www.woods-mathews.com/  This test is not yet approved or cleared by the [place] FDA and  has been authorized for detection and/or diagnosis of SARS-CoV-2 by FDA under an Emergency Use Authorization (EUA). This EUA will remain  in effect (meaning this test can be used) for the duration of the COVID-19 declaration under Se ction 564(b)(1) of the Act, 21 U.S.C. section 360bbb-3(b)(1), unless the authorization is terminated or revoked sooner.  Performed at [facility], [place] [address], [place],  [place] [address]      Studies: No results found.  Scheduled Meds: . allopurinol  100 mg Oral Daily  . clopidogrel  75 mg Oral Daily  . doxycycline  100 mg Oral Q12H  . entecavir  0.5 mg Oral QODAY  . gabapentin  100 mg Oral BID  . heparin  5,000 Units Subcutaneous Q8H  . hydroxychloroquine  200 mg Oral BID  . levothyroxine  75 mcg Oral Q0600  . pantoprazole  40 mg Oral Daily  . pravastatin  40 mg Oral QPM  . predniSONE  2.5 mg Oral QODAY  . sodium chloride flush  3 mL Intravenous Q12H  . valACYclovir  1,000 mg  Oral BID   Brief history patient admitted 11/18/2019 with suspected cellulitis and started on aggressive antibiotics.  When I saw the patient, the patient's rash looked like shingles to me.  I put in a consult for infectious disease and started Valtrex.  Put in isolation.  For acute kidney injury we gave IV fluids and creatinine improved from 2.56 down to 0.99.  Patient had a fever of 100.8 on 11/19/2020 and would like to watch the patient another day.  Past medical history of hepatitis B, hypothyroidism, warm hemolytic anemia and thrombocytopenia and anemia. Assessment/Plan:  1. Shingles.  Continue Valtrex 1000 mg twice daily today and likely can change to 3 times daily tomorrow with improved kidney function.  Started low-dose gabapentin with leg pain.  From wound culture I added on a varicella-zoster PCR which is a send out. 2. Fever of 100.8 notified by nursing staff at 1032 this morning.  Spoke with infectious disease specialist.  I canceled her discharge for today.  I think probably can get a fever with shingles.  Patient also on doxycycline. 3. Acute kidney injury on chronic kidney disease stage IIIa.  I discontinued IV fluids with creatinine improving from 2.56 down to 0.99. 4. History of warm autoimmune hemolytic anemia, thrombocytopenia.  Continue low-dose prednisone.  Holding PCP prophylaxis for right now with recovering from acute kidney injury.  Follow-up with Dr. Mike Gip as  outpatient. 5. Hepatitis B on entecavir every other day 6. Vitamin B12 deficiency 7. Essential hypertension holding hydrochlorothiazide and lisinopril for now 8. Hypothyroidism unspecified on levothyroxine.      Code Status:     Code Status Orders  (From admission, onward)         Start     Ordered   11/17/20 0855  Full code  Continuous        11/17/20 0901        Code Status History    Date Active Date Inactive Code Status Order ID Comments User Context   07/16/2020 1355 07/18/2020 1811 Full Code [id number]  Collier Bullock, MD ED   08/01/2016 1718 08/02/2016 1519 Full Code [id number]  Nicholes Mango, MD Inpatient   07/21/2016 1722 07/23/2016 1355 Full Code [id number]  Lytle Butte, MD ED   07/07/2016 2229 07/12/2016 1451 Full Code [id number]  Lance Coon, MD ED   Advance Care Planning Activity     Family Communication: Spoke with daughter at the bedside Disposition Plan: Status is: Inpatient  Dispo: The patient is from: Home              Anticipated d/c is to: Home              Patient currently had a fever 100.8 today and will watch for another night in the hospital and watch temperature curve come down.   Difficult to place patient.  No.  Consultants:  Infectious disease  Antibiotics:  Valtrex  Doxycycline  Time spent: 28 minutes  Anaeli Cornwall [employer]

## 2020-11-19 NOTE — Discharge Instructions (Addendum)
Valacyclovir caplets What is this medicine? VALACYCLOVIR (val ay SYE kloe veer) is an antiviral medicine. It is used to treat or prevent infections caused by certain kinds of viruses. Examples of these infections include herpes and shingles. This medicine will not cure herpes. This medicine may be used for other purposes; ask your health care provider or pharmacist if you have questions. COMMON BRAND NAME(S): Valtrex What should I tell my health care provider before I take this medicine? They need to know if you have any of these conditions:  acquired immunodeficiency syndrome (AIDS)  any other condition that may weaken the immune system  bone marrow or kidney transplant  kidney disease  an unusual or allergic reaction to valacyclovir, acyclovir, ganciclovir, valganciclovir, other medicines, foods, dyes, or preservatives  pregnant or trying to get pregnant  breast-feeding How should I use this medicine? Take this medicine by mouth with a glass of water. Follow the directions on the prescription label. You can take this medicine with or without food. Take your doses at regular intervals. Do not take your medicine more often than directed. Finish the full course prescribed by your doctor or health care professional even if you think your condition is better. Do not stop taking except on the advice of your doctor or health care professional. Talk to your pediatrician regarding the use of this medicine in children. While this drug may be prescribed for children as young as 2 years for selected conditions, precautions do apply. Overdosage: If you think you have taken too much of this medicine contact a poison control center or emergency room at once. NOTE: This medicine is only for you. Do not share this medicine with others. What if I miss a dose? If you miss a dose, take it as soon as you can. If it is almost time for your next dose, take only that dose. Do not take double or extra  doses. What may interact with this medicine? Do not take this medicine with any of the following medications:  cidofovir This medicine may also interact with the following medications:  adefovir  amphotericin B  certain antibiotics like amikacin, gentamicin, tobramycin, vancomycin  cimetidine  cisplatin  colistin  cyclosporine  foscarnet  lithium  methotrexate  probenecid  tacrolimus This list may not describe all possible interactions. Give your health care provider a list of all the medicines, herbs, non-prescription drugs, or dietary supplements you use. Also tell them if you smoke, drink alcohol, or use illegal drugs. Some items may interact with your medicine. What should I watch for while using this medicine? Tell your doctor or health care professional if your symptoms do not start to get better after 1 week. This medicine works best when taken early in the course of an infection, within the first 12 hours. Begin treatment as soon as possible after the first signs of infection like tingling, itching, or pain in the affected area. It is possible that genital herpes may still be spread even when you are not having symptoms. Always use safer sex practices like condoms made of latex or polyurethane whenever you have sexual contact. You should stay well hydrated while taking this medicine. Drink plenty of fluids. What side effects may I notice from receiving this medicine? Side effects that you should report to your doctor or health care professional as soon as possible:  allergic reactions like skin rash, itching or hives, swelling of the face, lips, or tongue  aggressive behavior  confusion  hallucinations  problems  with balance, talking, walking  stomach pain  tremor  trouble passing urine or change in the amount of urine Side effects that usually do not require medical attention (report to your doctor or health care professional if they continue or are  bothersome):  dizziness  headache  nausea, vomiting This list may not describe all possible side effects. Call your doctor for medical advice about side effects. You may report side effects to FDA at 1-800-FDA-1088. Where should I keep my medicine? Keep out of the reach of children. Store at room temperature between 15 and 25 degrees C (59 and 77 degrees F). Keep container tightly closed. Throw away any unused medicine after the expiration date. NOTE: This sheet is a summary. It may not cover all possible information. If you have questions about this medicine, talk to your doctor, pharmacist, or health care provider.  2021 Elsevier/Gold Standard (2018-09-13 12:22:33) Shingles  Shingles is an infection. It gives you a painful skin rash and blisters that have fluid in them. Shingles is caused by the same germ (virus) that causes chickenpox. Shingles only happens in people who:  Have had chickenpox.  Have been given a shot of medicine (vaccine) to protect against chickenpox. Shingles is rare in this group. The first symptoms of shingles may be itching, tingling, or pain in an area on your skin. A rash will show on your skin a few days or weeks later. The rash is likely to be on one side of your body. The rash usually has a shape like a belt or a band. Over time, the rash turns into fluid-filled blisters. The blisters will break open, change into scabs, and dry up. Medicines may:  Help with pain and itching.  Help you get better sooner.  Help to prevent long-term problems. Follow these instructions at home: Medicines  Take over-the-counter and prescription medicines only as told by your doctor.  Put on an anti-itch cream or numbing cream where you have a rash, blisters, or scabs. Do this as told by your doctor. Helping with itching and discomfort  Put cold, wet cloths (cold compresses) on the area of the rash or blisters as told by your doctor.  Cool baths can help you feel better.  Try adding baking soda or dry oatmeal to the water to lessen itching. Do not bathe in hot water.   Blister and rash care  Keep your rash covered with a loose bandage (dressing).  Wear loose clothing that does not rub on your rash.  Keep your rash and blisters clean. To do this, wash the area with mild soap and cool water as told by your doctor.  Check your rash every day for signs of infection. Check for: ? More redness, swelling, or pain. ? Fluid or blood. ? Warmth. ? Pus or a bad smell.  Do not scratch your rash. Do not pick at your blisters. To help you to not scratch: ? Keep your fingernails clean and cut short. ? Wear gloves or mittens when you sleep, if scratching is a problem. General instructions  Rest as told by your doctor.  Keep all follow-up visits as told by your doctor. This is important.  Wash your hands often with soap and water. If soap and water are not available, use hand sanitizer. Doing this lowers your chance of getting a skin infection caused by germs (bacteria).  Your infection can cause chickenpox in people who have never had chickenpox or never got a shot of chickenpox vaccine. If you  have blisters that did not change into scabs yet, try not to touch other people or be around other people, especially: ? Babies. ? Pregnant women. ? Children who have areas of red, itchy, or rough skin (eczema). ? Very old people who have transplants. ? People who have a long-term (chronic) sickness, like cancer or AIDS. Contact a doctor if:  Your pain does not get better with medicine.  Your pain does not get better after the rash heals.  You have any signs of infection in the rash area. These signs include: ? More redness, swelling, or pain around the rash. ? Fluid or blood coming from the rash. ? The rash area feeling warm to the touch. ? Pus or a bad smell coming from the rash. Get help right away if:  The rash is on your face or nose.  You have pain in your  face or pain by your eye.  You lose feeling on one side of your face.  You have trouble seeing.  You have ear pain, or you have ringing in your ear.  You have a loss of taste.  Your condition gets worse. Summary  Shingles gives you a painful skin rash and blisters that have fluid in them.  Shingles is an infection. It is caused by the same germ (virus) that causes chickenpox.  Keep your rash covered with a loose bandage (dressing). Wear loose clothing that does not rub on your rash.  If you have blisters that did not change into scabs yet, try not to touch other people or be around people. This information is not intended to replace advice given to you by your health care provider. Make sure you discuss any questions you have with your health care provider. Document Revised: 12/09/2018 Document Reviewed: 04/21/2017 Elsevier Patient Education  2021 [employer].

## 2020-11-19 NOTE — Plan of Care (Signed)

## 2020-11-19 NOTE — Progress Notes (Signed)
ID Pt had fever today No cough no sob Says left leg still painful but slightly better No diarrhea No dysuria   Patient Vitals for the past 24 hrs:  BP Temp Temp src Pulse Resp SpO2  11/19/20 1705 (!) 131/57 98.6 F (37 C) Oral 83 - 96 %  11/19/20 1151 (!) 111/93 98.4 F (36.9 C) - 87 15 97 %  11/19/20 1032 - (!) 100.8 F (38.2 C) Oral - - -  11/19/20 0907 137/67 98.8 F (37.1 C) - 90 14 97 %  11/19/20 0454 132/67 100.2 F (37.9 C) - 95 19 94 %  11/19/20 0044 132/66 98.5 F (36.9 C) - 89 19 96 %  11/18/20 2051 (!) 114/53 - - 93 - -  11/18/20 2011 (!) 93/32 98.7 F (37.1 C) - 94 15 96 %   O/e no distress Chest b/l air entry [id number] abd soft Left leg purpuric /hemorrhagic lesions in crops- tender          Labs CBC Latest Ref Rng & Units 11/19/2020 11/18/2020 11/17/2020  WBC 4.0 - 10.5 K/uL 3.4(L) 3.0(L) 4.3  Hemoglobin 12.0 - 15.0 g/dL 10.8(L) 10.9(L) 11.5(L)  Hematocrit 36.0 - 46.0 % 33.8(L) 34.0(L) 36.1  Platelets 150 - 400 K/uL 103(L) 93(L) 127(L)    CMP Latest Ref Rng & Units 11/19/2020 11/18/2020 11/17/2020  Glucose 70 - 99 mg/dL 92 90 102(H)  BUN 8 - 23 mg/dL 17 25(H) 42(H)  Creatinine 0.44 - 1.00 mg/dL 0.99 1.28(H) 2.56(H)  Sodium 135 - 145 mmol/L 139 140 138  Potassium 3.5 - 5.1 mmol/L 4.3 4.7 4.8  Chloride 98 - 111 mmol/L 110 110 106  CO2 22 - 32 mmol/L 25 25 25   Calcium 8.9 - 10.3 mg/dL 9.0 8.6(L) 9.0  Total Protein 6.5 - 8.1 g/dL - - -  Total Bilirubin 0.3 - 1.2 mg/dL - - -  Alkaline Phos 38 - 126 U/L - - -  AST 15 - 41 U/L - - -  ALT 0 - 44 U/L - - -   Impression/recommendation Linear painful hemorrhagic lesions over the left leg below knee and also 2 small lesions left buttock area Tender D.D Herpes zoster- pt is immunocompromised with rituxomab, and at risk and can present with atypical zoster ?? EM  ? 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

## 2020-11-20 LAB — URINALYSIS, ROUTINE W REFLEX MICROSCOPIC
Bilirubin Urine: NEGATIVE
Glucose, UA: NEGATIVE mg/dL
Hgb urine dipstick: NEGATIVE
Ketones, ur: NEGATIVE mg/dL
Leukocytes,Ua: NEGATIVE
Nitrite: NEGATIVE
Protein, ur: NEGATIVE mg/dL
Specific Gravity, Urine: 1.019 (ref 1.005–1.030)
pH: 5 (ref 5.0–8.0)

## 2020-11-20 LAB — CBC
HCT: 32.7 % — ABNORMAL LOW (ref 36.0–46.0)
Hemoglobin: 10.7 g/dL — ABNORMAL LOW (ref 12.0–15.0)
MCH: 30.5 pg (ref 26.0–34.0)
MCHC: 32.7 g/dL (ref 30.0–36.0)
MCV: 93.2 fL (ref 80.0–100.0)
Platelets: 98 10*3/uL — ABNORMAL LOW (ref 150–400)
RBC: 3.51 MIL/uL — ABNORMAL LOW (ref 3.87–5.11)
RDW: 14 % (ref 11.5–15.5)
WBC: 2.8 10*3/uL — ABNORMAL LOW (ref 4.0–10.5)
nRBC: 0 % (ref 0.0–0.2)

## 2020-11-20 LAB — BASIC METABOLIC PANEL
Anion gap: 5 (ref 5–15)
BUN: 16 mg/dL (ref 8–23)
CO2: 26 mmol/L (ref 22–32)
Calcium: 9 mg/dL (ref 8.9–10.3)
Chloride: 108 mmol/L (ref 98–111)
Creatinine, Ser: 1.05 mg/dL — ABNORMAL HIGH (ref 0.44–1.00)
GFR, Estimated: >60 mL/min (ref >60)
Glucose, Bld: 86 mg/dL (ref 70–99)
Potassium: 4 mmol/L (ref 3.5–5.1)
Sodium: 139 mmol/L (ref 135–145)

## 2020-11-20 LAB — C-REACTIVE PROTEIN: CRP: 0.7 mg/dL (ref <1.0)

## 2020-11-20 LAB — VARICELLA-ZOSTER BY PCR: Varicella-Zoster, PCR: POSITIVE — AB

## 2020-11-20 MED ORDER — VALACYCLOVIR HCL 1 G PO TABS: 1000.0000 mg | ORAL_TABLET | Freq: Three times a day (TID) | ORAL | 0 refills | Status: AC

## 2020-11-20 MED ORDER — VALACYCLOVIR HCL 500 MG PO TABS: 500.0000 mg | ORAL_TABLET | Freq: Every day | ORAL | 0 refills | Status: DC

## 2020-11-20 MED ORDER — OXYCODONE HCL 5 MG PO TABS
5.0000 mg | ORAL_TABLET | Freq: Three times a day (TID) | ORAL | 0 refills | Status: AC | PRN
Start: 2020-11-20 — End: 2020-11-23

## 2020-11-20 MED ORDER — VALACYCLOVIR HCL 500 MG PO TABS
1000.0000 mg | ORAL_TABLET | Freq: Three times a day (TID) | ORAL | Status: DC
  Filled 2020-11-20 (×2): qty 2

## 2020-11-20 NOTE — Progress Notes (Signed)
Tina Page to be D/C'd Home per MD order.  Discussed prescriptions and follow up appointments with the patient. Prescriptions were sent to patient's pharmacy, medication list explained in detail. Pt verbalized understanding.  Allergies as of 11/20/2020      Reactions   Ciprofloxacin Swelling   Facial swelling following single oral dose.    Codeine Anaphylaxis   Nsaids Other (See Comments)   PT HAS HEPC      Medication List    STOP taking these medications   hydrochlorothiazide 25 MG tablet Commonly known as: HYDRODIURIL   lisinopril 20 MG tablet Commonly known as: ZESTRIL   potassium chloride 20 MEQ packet Commonly known as: KLOR-CON   sulfamethoxazole-trimethoprim 800-160 MG tablet Commonly known as: BACTRIM DS     TAKE these medications   acetaminophen 650 MG CR tablet Commonly known as: TYLENOL Take 650 mg by mouth every 8 (eight) hours as needed for pain. Notes to patient: Last dose given 5:57pm 11/18/20   albuterol 108 (90 Base) MCG/ACT inhaler Commonly known as: VENTOLIN HFA Inhale 2 puffs into the lungs every 6 (six) hours as needed for wheezing or shortness of breath. Notes to patient: Not given while in hospital    allopurinol 100 MG tablet Commonly known as: ZYLOPRIM TAKE 1 TABLET BY MOUTH EVERY DAY Notes to patient: Last dose given 11/18/20 5:58pm   clopidogrel 75 MG tablet Commonly known as: PLAVIX Take 1 tablet (75 mg total) by mouth daily. Notes to patient: Not given while in hospital    diclofenac Sodium 1 % Gel Commonly known as: VOLTAREN Apply 2 g topically 4 (four) times daily. What changed:   when to take this  reasons to take this Notes to patient: Not given while in hospital    entecavir 0.5 MG tablet Commonly known as: BARACLUDE Take 0.5 mg by mouth every other day. Notes to patient: Not given while in hospital    folic acid 1 MG tablet Commonly known as: FOLVITE TAKE 1 TABLET (1 MG TOTAL) BY MOUTH DAILY. Notes to patient:  Not given while in hospital    gabapentin 100 MG capsule Commonly known as: NEURONTIN Take 1 capsule (100 mg total) by mouth 2 (two) times daily. Notes to patient: Not given while in hospital    hydroxychloroquine 200 MG tablet Commonly known as: PLAQUENIL Take 200 mg by mouth 2 (two) times daily. Notes to patient: Last dose given 11/18/20 10:05pm   levothyroxine 75 MCG tablet Commonly known as: SYNTHROID Take 75 mcg by mouth daily before breakfast. Notes to patient: Last dose given 11/19/20 6:20am   omeprazole 20 MG capsule Commonly known as: PriLOSEC Take 1 capsule (20 mg total) by mouth daily. Notes to patient: Not given while in hospital    oxyCODONE 5 MG immediate release tablet Commonly known as: Roxicodone Take 1 tablet (5 mg total) by mouth every 8 (eight) hours as needed for up to 3 days.   pravastatin 40 MG tablet Commonly known as: PRAVACHOL Take 40 mg by mouth every evening. Notes to patient: Last dose given 11/18/20 5:53pm   predniSONE 2.5 MG tablet Commonly known as: DELTASONE Take 1 tablet (2.5 mg total) by mouth every other day. Notes to patient: Last dose given 11/18/20 10:06pm   valACYclovir 1000 MG tablet Commonly known as: VALTREX Take 1 tablet (1,000 mg total) by mouth 3 (three) times daily for 7 days.   valACYclovir 500 MG tablet Commonly known as: Valtrex Take 1 tablet (500 mg total) by mouth daily.  Start taking on: November 28, 2020   vitamin B-12 500 MCG tablet Commonly known as: CYANOCOBALAMIN Take 500 mcg by mouth daily. Notes to patient: Not given while in hospital        Vitals:   11/20/20 0805 11/20/20 1111  BP: 132/66 (!) 121/56  Pulse: 86 78  Resp: 17 18  Temp: 99.3 F (37.4 C) 98.8 F (37.1 C)  SpO2: 96% 98%    Skin clean, dry and intact without evidence of skin break down, no evidence of skin tears noted. IV catheter discontinued intact. Site without signs and symptoms of complications. Dressing and pressure applied. Pt denies  pain at this time. No complaints noted.  An After Visit Summary was printed and given to the patient. Patient escorted via [place], and D/C home via private auto. Daughter at bedside will transport pt home.  Rolley Sims

## 2020-11-20 NOTE — Plan of Care (Signed)
  Problem: Education: Goal: Knowledge of General Education information will improve Description: Including pain rating scale, medication(s)/side effects and non-pharmacologic comfort measures Outcome: Progressing   Problem: Health Behavior/Discharge Planning: Goal: Ability to manage health-related needs will improve Outcome: Progressing   Problem: Activity: Goal: Risk for activity intolerance will decrease Outcome: Progressing   Problem: Nutrition: Goal: Adequate nutrition will be maintained Outcome: Progressing   Problem: Coping: Goal: Level of anxiety will decrease Outcome: Progressing   Problem: Elimination: Goal: Will not experience complications related to bowel motility Outcome: Progressing Goal: Will not experience complications related to urinary retention Outcome: Progressing   Problem: Pain Managment: Goal: General experience of comfort will improve Outcome: Progressing   Problem: Safety: Goal: Ability to remain free from injury will improve Outcome: Progressing   Problem: Skin Integrity: Goal: Risk for impaired skin integrity will decrease Outcome: Progressing   Problem: Skin Integrity: Goal: Risk for impaired skin integrity will decrease Outcome: Progressing

## 2020-11-20 NOTE — Discharge Summary (Signed)
Physician Discharge Summary  Tina Page CVE:[id number] DOB: 07-21-1956 DOA: 11/17/2020  PCP: Frazier Richards, MD  Admit date: 11/17/2020 Discharge date: 11/20/2020  Admitted From: Home Disposition: Home  Recommendations for Outpatient Follow-up:  1. Follow up with PCP in 1-2 weeks 2. Follow up with heme/onc 1-2 weeks 3. Follow up with infectious disease 1-2 weeks  Home Health: No Equipment/Devices:None Discharge Condition:Stable CODE STATUS:Full Diet recommendation: Heart Healthy  Brief/Interim Summary:  65 y.o. female seen in ed with complaints of left leg abrasion and rash on lower legs since returning to [name] from [place] a week ago and rash starting 3 days ago. Pt reports that [place] and that the rash has progressed to her lower legs and back. It is itchy and red and raised and tender, patient reports the pain is 8 out of 10, it is constant, it is worse with walking and trying to move her toes and putting weight on her leg.  Tylenol helps the pain but not completely.  The rash patient notes started on Friday and she fell on Thursday and she arrived in the [name] on Saturday.  Patient does have bilateral iliac stents for her PAD and she has been compliant with her medications and has not smoked since 2017 since she had the stents.  No history of shingles.  Patient is currently receiving treatment for hep C.  Patient has newly established with a rheumatologist locally Dr. Posey Pronto.  Also discussed with patient about her anemia patient states that she has had a colonoscopy where she had a polyp removed also an endoscopy and has not seen GI since 2017.  Patient is allergic to NSAIDs.  Patient has been on prednisone therapy since October.  Initial concern for cellulitis.  Physical exam is consistent with typical zoster flare.  PCR on wound was taken and sent for zoster.  This came back positive on the day of discharge.  Communicated with infectious disease.  Patient cleared  for discharge from their standpoint.  Will discharge on 7 days of Valtrex 1 g 3 times daily, followed by 500 mg daily indefinitely for suppressive therapy.  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 Shingles.  Confirmed via PCR.  Communicated with infectious disease.  Recommend Valtrex 1 g 3 times daily x7 days, followed by 500mg  suppressive therapy indefinitely.  Patient will need follow-up in 1 week for reassessment of serum creatinine and potential adjustment of dosing. 2. Fever of 100.8 notified by nursing staff at 1032 on 3/22.  Infectious disease consulted.  Likely due to zoster flare. 3. Acute kidney injury on chronic kidney disease stage IIIa.    Creatinine baseline at time of discharge 4. History of warm autoimmune hemolytic anemia, thrombocytopenia.  Continue low-dose prednisone.  Holding PCP prophylaxis for right now with recovering from acute kidney injury.  Follow-up with Dr. Mike Gip as outpatient. 5. Hepatitis B on entecavir every other day 6. Vitamin B12 deficiency 7. Essential hypertension holding hydrochlorothiazide and lisinopril for now.  Recommend to hold on discharge until patient is seen by her primary care physician and repeat kidney function can tolerate reintroduction of these medications 8. Hypothyroidism unspecified on levothyroxine.   Discharge  Instructions  Discharge Instructions    Diet - low sodium heart healthy   Complete by: As directed    Increase activity slowly   Complete by: As directed    No wound care   Complete by: As directed      Allergies as of 11/20/2020      Reactions   Ciprofloxacin Swelling   Facial swelling following single oral dose.    Codeine Anaphylaxis   Nsaids Other (See Comments)   PT HAS HEPC      Medication List    STOP taking these medications   hydrochlorothiazide 25 MG tablet Commonly known as: HYDRODIURIL   lisinopril 20 MG tablet Commonly known as: ZESTRIL   potassium chloride 20 MEQ packet Commonly known as: KLOR-CON   sulfamethoxazole-trimethoprim 800-160 MG tablet Commonly known as: BACTRIM DS     TAKE these medications   acetaminophen 650 MG CR tablet Commonly known as: TYLENOL Take 650 mg by mouth every 8 (eight) hours as needed for pain. Notes to patient: Last dose given 5:57pm 11/18/20   albuterol 108 (90 Base) MCG/ACT inhaler Commonly known as: VENTOLIN HFA Inhale 2 puffs into the lungs every 6 (six) hours as needed for wheezing or shortness of breath. Notes to patient: Not given while in hospital    allopurinol 100 MG tablet Commonly known as: ZYLOPRIM TAKE 1 TABLET BY MOUTH EVERY DAY Notes to patient: Last dose given 11/18/20 5:58pm   clopidogrel 75 MG tablet Commonly known as: PLAVIX Take 1 tablet (75 mg total) by mouth daily. Notes to patient: Not given while in hospital    diclofenac Sodium 1 % Gel Commonly known as: VOLTAREN Apply 2 g topically 4 (four) times daily. What changed:   when to take this  reasons to take this Notes to patient: Not given while in hospital    entecavir 0.5 MG tablet Commonly known as: BARACLUDE Take 0.5 mg by mouth every other day. Notes to patient: Not given while in hospital    folic acid 1 MG tablet Commonly known as: FOLVITE TAKE 1 TABLET (1 MG TOTAL) BY MOUTH DAILY. Notes to patient: Not given while in  hospital    gabapentin 100 MG capsule Commonly known as: NEURONTIN Take 1 capsule (100 mg total) by mouth 2 (two) times daily. Notes to patient: Not given while in hospital    hydroxychloroquine 200 MG tablet Commonly known as: PLAQUENIL Take 200 mg by mouth 2 (two) times daily. Notes to patient: Last dose given 11/18/20 10:05pm   levothyroxine 75 MCG tablet Commonly known as: SYNTHROID Take 75 mcg by mouth daily before breakfast. Notes to patient: Last dose given 11/19/20 6:20am   omeprazole 20 MG capsule Commonly known as: PriLOSEC Take 1 capsule (20 mg total) by mouth daily. Notes to patient: Not given while in hospital    oxyCODONE 5 MG immediate release tablet Commonly known as: Roxicodone Take 1 tablet (5 mg total) by mouth every 8 (eight) hours as needed for up to 3 days.   pravastatin 40 MG tablet Commonly known as: PRAVACHOL Take 40 mg by mouth every evening. Notes to patient: Last dose given 11/18/20  5:53pm   predniSONE 2.5 MG tablet Commonly known as: DELTASONE Take 1 tablet (2.5 mg total) by mouth every other day. Notes to patient: Last dose given 11/18/20 10:06pm   valACYclovir 1000 MG tablet Commonly known as: VALTREX Take 1 tablet (1,000 mg total) by mouth 3 (three) times daily for 7 days.   valACYclovir 500 MG tablet Commonly known as: Valtrex Take 1 tablet (500 mg total) by mouth daily. Start taking on: November 28, 2020   vitamin B-12 500 MCG tablet Commonly known as: CYANOCOBALAMIN Take 500 mcg by mouth daily. Notes to patient: Not given while in hospital        Follow-up [place], Elena M, MD On 11/05/2020.   Specialty: Family Medicine Why: at [facility] information: [place] [address] [phone]        Lequita Asal, MD. Schedule an appointment as soon as possible for a visit in 1 week(s).   Specialty: Hematology and Oncology Contact information: [place]  [address] [phone]        Tsosie Billing, MD. Schedule an appointment as soon as possible for a visit in 1 week(s).   Specialty: Infectious Diseases Why: Infectious disease.  Please follow up 7-10 days Contact information: [address] [place]  [address] [phone]              Allergies  Allergen Reactions  . Ciprofloxacin Swelling    Facial swelling following single oral dose.   . Codeine Anaphylaxis  . Nsaids Other (See Comments)    PT HAS HEPC    Consultations:  ID   Procedures/Studies:  No results found. (Echo, Carotid, EGD, Colonoscopy, ERCP)    Subjective: Patient seen and examined on the day of discharge.  Still with some mild leg pain but improving over interval.  Stable for discharge home at this time.  Discharge Exam: Vitals:   11/20/20 0805 11/20/20 1111  BP: 132/66 (!) 121/56  Pulse: 86 78  Resp: 17 18  Temp: 99.3 F (37.4 C) 98.8 F (37.1 C)  SpO2: 96% 98%   Vitals:   11/19/20 2313 11/20/20 0519 11/20/20 0805 11/20/20 1111  BP: (!) 117/54 (!) 117/58 132/66 (!) 121/56  Pulse: 96 78 86 78  Resp: 16 16 17 18   Temp: 99.1 F (37.3 C) 98.6 F (37 C) 99.3 F (37.4 C) 98.8 F (37.1 C)  TempSrc: Oral Oral Oral   SpO2: 95% 95% 96% 98%  Weight:      Height:        General: Pt is alert, awake, not in acute distress Cardiovascular: RRR, S1/S2 +, no rubs, no gallops Respiratory: CTA bilaterally, no wheezing, no rhonchi Abdominal: Soft, NT, ND, bowel sounds + Extremities: Zoster lesions left lower extremity.  Painful to touch    The results of significant diagnostics from this hospitalization (including imaging, microbiology, ancillary and laboratory) are listed below for reference.     Microbiology: Recent Results (from the past 240 hour(s))  Blood Culture (routine x 2)     Status: None (Preliminary result)   Collection Time: 11/17/20  7:22 AM   Specimen: BLOOD  Result Value Ref Range Status   Specimen  Description BLOOD BLOOD LEFT HAND  Final   Special Requests   Final    BOTTLES DRAWN AEROBIC AND ANAEROBIC Blood Culture results may not be optimal due to an inadequate volume of blood received in culture bottles   Culture   Final  NO GROWTH 3 DAYS Performed at [facility], [address] [place], [place] [address]    Report Status PENDING  Incomplete  Blood Culture (routine x 2)     Status: None (Preliminary result)   Collection Time: 11/17/20  7:22 AM   Specimen: BLOOD  Result Value Ref Range Status   Specimen Description BLOOD BLOOD RIGHT HAND  Final   Special Requests   Final    BOTTLES DRAWN AEROBIC AND ANAEROBIC Blood Culture results may not be optimal due to an inadequate volume of blood received in culture bottles   Culture   Final    NO GROWTH 3 DAYS Performed at [facility], [address], [place], [place] [address]    Report Status PENDING  Incomplete  Aerobic/Anaerobic Culture w Gram Stain (surgical/deep wound)     Status: None (Preliminary result)   Collection Time: 11/17/20  7:22 AM   Specimen: Foot; Wound  Result Value Ref Range Status   Specimen Description   Final    FOOT LEFT FOOT Performed at [facility], [address], [place], [place] [address]    Special Requests   Final    Immunocompromised Performed at [facility], [place], [place] [address]    Gram Stain NO WBC SEEN NO ORGANISMS SEEN   Final   Culture   Final    NO GROWTH 3 DAYS NO ANAEROBES ISOLATED; CULTURE IN PROGRESS FOR 5 DAYS Performed at [facility], [address], [place], [place] [address]    Report Status PENDING  Incomplete  SARS CORONAVIRUS 2 (TAT 6-24 HRS) Nasopharyngeal Nasopharyngeal Swab     Status: None   Collection Time: 11/17/20  7:22 AM   Specimen: Nasopharyngeal Swab  Result Value Ref Range Status   SARS Coronavirus 2 NEGATIVE NEGATIVE Final    Comment: (NOTE) SARS-CoV-2 target nucleic acids are NOT  DETECTED.  The SARS-CoV-2 RNA is generally detectable in upper and lower respiratory specimens during the acute phase of infection. Negative results do not preclude SARS-CoV-2 infection, do not rule out co-infections with other pathogens, and should not be used as the sole basis for treatment or other patient management decisions. Negative results must be combined with clinical observations, patient history, and epidemiological information. The expected result is Negative.  Fact Sheet for Patients: SugarRoll.be  Fact Sheet for Healthcare Providers: https://www.woods-mathews.com/  This test is not yet approved or cleared by the [place] FDA and  has been authorized for detection and/or diagnosis of SARS-CoV-2 by FDA under an Emergency Use Authorization (EUA). This EUA will remain  in effect (meaning this test can be used) for the duration of the COVID-19 declaration under Se ction 564(b)(1) of the Act, 21 U.S.C. section 360bbb-3(b)(1), unless the authorization is terminated or revoked sooner.  Performed at [facility], [place] [address], [place], [place] [address]   CULTURE, BLOOD (ROUTINE X 2) w Reflex to ID Panel     Status: None (Preliminary result)   Collection Time: 11/19/20  8:45 PM   Specimen: BLOOD  Result Value Ref Range Status   Specimen Description BLOOD LEFT ANTECUBITAL  Final   Special Requests   Final    BOTTLES DRAWN AEROBIC AND ANAEROBIC Blood Culture adequate volume   Culture   Final    NO GROWTH < 12 HOURS Performed at [facility], [address], [place], [place] [address]    Report Status PENDING  Incomplete  CULTURE, BLOOD (ROUTINE  X 2) w Reflex to ID Panel     Status: None (Preliminary result)   Collection Time: 11/19/20  8:57 PM   Specimen: BLOOD  Result Value Ref Range Status   Specimen Description BLOOD BLOOD LEFT HAND  Final   Special Requests   Final    BOTTLES DRAWN AEROBIC AND  ANAEROBIC Blood Culture adequate volume   Culture   Final    NO GROWTH < 12 HOURS Performed at [facility], [address] [place], [place] [address]    Report Status PENDING  Incomplete     Labs: BNP (last 3 results) No results for input(s): BNP in the last 8760 hours. Basic Metabolic Panel: Recent Labs  Lab 11/17/20 0258 11/18/20 0407 11/19/20 0450 11/20/20 0403  NA 138 140 139 139  K 4.8 4.7 4.3 4.0  CL 106 110 110 108  CO2 25 25 25 26   GLUCOSE 102* 90 92 86  BUN 42* 25* 17 16  CREATININE 2.56* 1.28* 0.99 1.05*  CALCIUM 9.0 8.6* 9.0 9.0   Liver Function Tests: No results for input(s): AST, ALT, ALKPHOS, BILITOT, PROT, ALBUMIN in the last 168 hours. No results for input(s): LIPASE, AMYLASE in the last 168 hours. No results for input(s): AMMONIA in the last 168 hours. CBC: Recent Labs  Lab 11/17/20 0258 11/18/20 0407 11/19/20 0450 11/20/20 0403  WBC 4.3 3.0* 3.4* 2.8*  NEUTROABS 2.5  --   --   --   HGB 11.5* 10.9* 10.8* 10.7*  HCT 36.1 34.0* 33.8* 32.7*  MCV 95.8 94.2 94.4 93.2  PLT 127* 93* 103* 98*   Cardiac Enzymes: Recent Labs  Lab 11/18/20 0407  CKTOTAL 74   BNP: Invalid input(s): POCBNP CBG: No results for input(s): GLUCAP in the last 168 hours. D-Dimer No results for input(s): DDIMER in the last 72 hours. Hgb A1c No results for input(s): HGBA1C in the last 72 hours. Lipid Profile No results for input(s): CHOL, HDL, LDLCALC, TRIG, CHOLHDL, LDLDIRECT in the last 72 hours. Thyroid function studies No results for input(s): TSH, T4TOTAL, T3FREE, THYROIDAB in the last 72 hours.  Invalid input(s): FREET3 Anemia work up No results for input(s): VITAMINB12, FOLATE, FERRITIN, TIBC, IRON, RETICCTPCT in the last 72 hours. Urinalysis    Component Value Date/Time   COLORURINE YELLOW (A) 11/19/2020 2017   APPEARANCEUR CLEAR (A) 11/19/2020 2017   LABSPEC 1.019 11/19/2020 2017   PHURINE 5.0 11/19/2020 2017   GLUCOSEU NEGATIVE 11/19/2020  2017   HGBUR NEGATIVE 11/19/2020 2017   BILIRUBINUR NEGATIVE 11/19/2020 2017   KETONESUR NEGATIVE 11/19/2020 2017   PROTEINUR NEGATIVE 11/19/2020 2017   NITRITE NEGATIVE 11/19/2020 2017   LEUKOCYTESUR NEGATIVE 11/19/2020 2017   Sepsis Labs Invalid input(s): PROCALCITONIN,  WBC,  LACTICIDVEN Microbiology Recent Results (from the past 240 hour(s))  Blood Culture (routine x 2)     Status: None (Preliminary result)   Collection Time: 11/17/20  7:22 AM   Specimen: BLOOD  Result Value Ref Range Status   Specimen Description BLOOD BLOOD LEFT HAND  Final   Special Requests   Final    BOTTLES DRAWN AEROBIC AND ANAEROBIC Blood Culture results may not be optimal due to an inadequate volume of blood received in culture bottles   Culture   Final    NO GROWTH 3 DAYS Performed at [facility], [address] [place], [place] [address]    Report Status PENDING  Incomplete  Blood Culture (routine x 2)     Status: None (Preliminary result)  Collection Time: 11/17/20  7:22 AM   Specimen: BLOOD  Result Value Ref Range Status   Specimen Description BLOOD BLOOD RIGHT HAND  Final   Special Requests   Final    BOTTLES DRAWN AEROBIC AND ANAEROBIC Blood Culture results may not be optimal due to an inadequate volume of blood received in culture bottles   Culture   Final    NO GROWTH 3 DAYS Performed at [facility], [address] [place], [place] [address]    Report Status PENDING  Incomplete  Aerobic/Anaerobic Culture w Gram Stain (surgical/deep wound)     Status: None (Preliminary result)   Collection Time: 11/17/20  7:22 AM   Specimen: Foot; Wound  Result Value Ref Range Status   Specimen Description   Final    FOOT LEFT FOOT Performed at [facility], [address] [place], [place] [address]    Special Requests   Final    Immunocompromised Performed at [facility], [place], [place] [address]    Gram Stain NO WBC SEEN NO  ORGANISMS SEEN   Final   Culture   Final    NO GROWTH 3 DAYS NO ANAEROBES ISOLATED; CULTURE IN PROGRESS FOR 5 DAYS Performed at [facility], [address], [place], [place] [address]    Report Status PENDING  Incomplete  SARS CORONAVIRUS 2 (TAT 6-24 HRS) Nasopharyngeal Nasopharyngeal Swab     Status: None   Collection Time: 11/17/20  7:22 AM   Specimen: Nasopharyngeal Swab  Result Value Ref Range Status   SARS Coronavirus 2 NEGATIVE NEGATIVE Final    Comment: (NOTE) SARS-CoV-2 target nucleic acids are NOT DETECTED.  The SARS-CoV-2 RNA is generally detectable in upper and lower respiratory specimens during the acute phase of infection. Negative results do not preclude SARS-CoV-2 infection, do not rule out co-infections with other pathogens, and should not be used as the sole basis for treatment or other patient management decisions. Negative results must be combined with clinical observations, patient history, and epidemiological information. The expected result is Negative.  Fact Sheet for Patients: SugarRoll.be  Fact Sheet for Healthcare Providers: https://www.woods-mathews.com/  This test is not yet approved or cleared by the [place] FDA and  has been authorized for detection and/or diagnosis of SARS-CoV-2 by FDA under an Emergency Use Authorization (EUA). This EUA will remain  in effect (meaning this test can be used) for the duration of the COVID-19 declaration under Se ction 564(b)(1) of the Act, 21 U.S.C. section 360bbb-3(b)(1), unless the authorization is terminated or revoked sooner.  Performed at [facility], [place] [address], [place], [place] [address]   CULTURE, BLOOD (ROUTINE X 2) w Reflex to ID Panel     Status: None (Preliminary result)   Collection Time: 11/19/20  8:45 PM   Specimen: BLOOD  Result Value Ref Range Status   Specimen Description BLOOD LEFT ANTECUBITAL  Final   Special Requests   Final     BOTTLES DRAWN AEROBIC AND ANAEROBIC Blood Culture adequate volume   Culture   Final    NO GROWTH < 12 HOURS Performed at [facility], [address] [place], [place] [address]    Report Status PENDING  Incomplete  CULTURE, BLOOD (ROUTINE X 2) w Reflex to ID Panel     Status: None (Preliminary result)   Collection Time: 11/19/20  8:57 PM   Specimen: BLOOD  Result Value Ref Range Status   Specimen Description BLOOD BLOOD  LEFT HAND  Final   Special Requests   Final    BOTTLES DRAWN AEROBIC AND ANAEROBIC Blood Culture adequate volume   Culture   Final    NO GROWTH < 12 HOURS Performed at [facility], [address], [place], [place] [address]    Report Status PENDING  Incomplete     Time coordinating discharge: Over 30 minutes  SIGNED:   Sidney Ace, MD  [facility] 11/20/2020, 12:53 PM Pager   If 7PM-7AM, please contact night-coverage

## 2020-11-20 NOTE — Plan of Care (Signed)

## 2020-11-21 ENCOUNTER — Encounter: Payer: Self-pay | Admitting: Hematology and Oncology

## 2020-11-21 ENCOUNTER — Inpatient Hospital Stay: Payer: BC Managed Care – PPO

## 2020-11-21 ENCOUNTER — Inpatient Hospital Stay: Payer: BC Managed Care – PPO | Admitting: Hematology and Oncology

## 2020-11-21 LAB — TYPE AND SCREEN
ABO/RH(D): A NEG
Antibody Screen: POSITIVE
DAT, IgG: POSITIVE
DAT, complement: NEGATIVE
Unit division: 0
Unit division: 0

## 2020-11-21 LAB — BPAM RBC
Blood Product Expiration Date: 202204082359
Blood Product Expiration Date: 202204162359
Unit Type and Rh: 600
Unit Type and Rh: 600

## 2020-11-21 LAB — CRYOGLOBULIN

## 2020-11-21 NOTE — Progress Notes (Signed)
Patient called/ pre- screened for virtual appoinment with oncologist. Concerns of shingles rash and FMLA paperwork

## 2020-11-22 ENCOUNTER — Telehealth: Payer: Self-pay

## 2020-11-22 ENCOUNTER — Encounter: Payer: Self-pay | Admitting: Hematology and Oncology

## 2020-11-22 ENCOUNTER — Inpatient Hospital Stay (HOSPITAL_BASED_OUTPATIENT_CLINIC_OR_DEPARTMENT_OTHER): Payer: BC Managed Care – PPO | Admitting: Hematology and Oncology

## 2020-11-22 DIAGNOSIS — D591 Autoimmune hemolytic anemia, unspecified: Secondary | ICD-10-CM

## 2020-11-22 DIAGNOSIS — B029 Zoster without complications: Secondary | ICD-10-CM | POA: Diagnosis not present

## 2020-11-22 DIAGNOSIS — N1832 Chronic kidney disease, stage 3b: Secondary | ICD-10-CM | POA: Diagnosis not present

## 2020-11-22 DIAGNOSIS — D696 Thrombocytopenia, unspecified: Secondary | ICD-10-CM | POA: Diagnosis not present

## 2020-11-22 DIAGNOSIS — D72819 Decreased white blood cell count, unspecified: Secondary | ICD-10-CM

## 2020-11-22 LAB — ANTIPHOSPHOLIPID SYNDROME EVAL, BLD
Anticardiolipin IgA: <9 APL U/mL (ref 0–11)
Anticardiolipin IgG: <9 GPL U/mL (ref 0–14)
Anticardiolipin IgM: 15 MPL U/mL — ABNORMAL HIGH (ref 0–12)
DRVVT: 35.3 s (ref 0.0–47.0)
PTT Lupus Anticoagulant: 38.8 s (ref 0.0–51.9)
Phosphatydalserine, IgA: 3 APS Units (ref 0–19)
Phosphatydalserine, IgG: <9 Units (ref 0–30)
Phosphatydalserine, IgM: 33 Units — ABNORMAL HIGH (ref 0–30)

## 2020-11-22 LAB — CULTURE, BLOOD (ROUTINE X 2)
Culture: NO GROWTH
Culture: NO GROWTH

## 2020-11-22 LAB — AEROBIC/ANAEROBIC CULTURE W GRAM STAIN (SURGICAL/DEEP WOUND)
Culture: NO GROWTH
Gram Stain: NONE SEEN

## 2020-11-22 NOTE — Progress Notes (Signed)
[facility]  [address] [place], [place] [address] Phone: [phone]  Fax: [phone]   Telemedicine Visit:  11/22/2020  Referring physician: Frazier Richards, MD  I connected with Tina Page on 11/22/2020 at 10:45 AM by videoconferencing and verified that I was speaking with the correct person using 2 identifiers. The patient was at home.  I discussed the limitations, risk, security and privacy concerns of performing an evaluation and management service by videoconferencing and the availability of in person appointments.  I also discussed with the patient that there may be a patient responsible charge related to this service.  The patient expressed understanding and agreed to proceed.   Chief Complaint: Tina Page is a 65 y.o. 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  HPI: The patient was last seen in the hematology clinic on 10/10/2020. At that time, she felt "ok." Her urine and stool were normal in color. She had started Plaquenil 4 days prior without side effects. She was on Septra three times per week for PCP prophylaxis.  Facial rash had improved with a topical cream prescribed by dermatology.  She was on prednisone 2.5 mg a day.  She was on entecavir 0.5 mg every other day secondary to her renal insufficiency.  She was admitted to [facility] from 11/17/2020 - 11/20/2020 with varicella zoster.  She presented with a lower extremity rash following an abrasion in the [place].  Rash was painful and had progressed on her lower legs and back.  Initial evaluation was concerning for cellulitis; physical exam was c/w a zoster flare (linear painful erythematous lesions over the left leg below the knee and 2 small lesions left buttock area).  Lesion was positive for VZV.  She was seen by Dr. Steva Ready and infectious disease.  She was treated with  ceftazidime and a valacyclovir.  She was discharged on 9 days of Valtrex 1 gm 3 times daily followed by 500 mg daily indefinitely for suppression.  While hospitalized she had acute kidney injury on chronic renal insufficiency.  Creatinine returned to baseline at time of discharge.  She continued on low-dose prednisone (2.5 mg QOD).  PCP prophylaxis was held post recovery from acute kidney injury continued on entecavir every other day.  She states that she was taken off her blood pressure medications (HCTZ and lisinopril).  Labs during her admission included: 11/17/2020: Hematocrit 36.1, hemoglobin 11.5, MCV 95.8, platelets 127,000, WBC 4300 (ANC 2500).  Creatinine 2.56. 11/20/2020: Hematocrit 32.7, hemoglobin 10.7, MCV 93.2, platelets   98,000, WBC 2800.  Creatinine 1.05.   Additional labs included a ferritin of 368, iron saturation 13% and TIBC 290 on 11/17/2020.  Folate was 30.  B12 was 853.  During the interim, she notes the rash has improved except from between her toes.  She denies any fever,  She denies any change in urine color.  She is on prednisone 2.5 mg QOD; today is an off day.  She remains on valacyclovir.  She is on entecavir QOD.  She is on doxycycline.  She is eating and drinking well.  Facial rash has improved.   Past Medical History:  Diagnosis Date  . Anemia   . Atherosclerosis of native arteries of extremity with intermittent claudication ([place]) 07/20/2016  . COPD (chronic obstructive pulmonary disease) ([place])   . 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 Elevated liver function tests 11/03/2017  . GERD (gastroesophageal reflux disease)   .  Heme positive stool 01/29/2015  . Hepatitis C 07/12/2016  . HLD (hyperlipidemia)   . Hypertension   . Hypothyroidism   . 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 Thrombocytopenia ([place]) 10/04/2016    Past Surgical History:  Procedure Laterality Date  . ELECTROMAGNETIC NAVIGATION BROCHOSCOPY N/A 12/07/2017    Procedure: ELECTROMAGNETIC NAVIGATION BRONCHOSCOPY;  Surgeon: Flora Lipps, MD;  Location: [facility] ORS;  Service: Cardiopulmonary;  Laterality: N/A;  . ESOPHAGOGASTRODUODENOSCOPY (EGD) WITH PROPOFOL N/A 07/08/2016   Procedure: ESOPHAGOGASTRODUODENOSCOPY (EGD) WITH PROPOFOL;  Surgeon: Manya Silvas, MD;  Location: [facility] ENDOSCOPY;  Service: Endoscopy;  Laterality: N/A;  . KNEE SURGERY Right    repair of acl tear  . PERIPHERAL VASCULAR CATHETERIZATION N/A 07/10/2016   Procedure: Lower Extremity Angiography;  Surgeon: Katha Cabal, MD;  Location: [place] CV LAB;  Service: Cardiovascular;  Laterality: N/A;  . PERIPHERAL VASCULAR CATHETERIZATION N/A 07/10/2016   Procedure: Abdominal Aortogram w/Lower Extremity;  Surgeon: Katha Cabal, MD;  Location: [place] [facility];  Service: Cardiovascular;  Laterality: N/A;  . PERIPHERAL VASCULAR CATHETERIZATION  07/10/2016   Procedure: Lower Extremity Intervention;  Surgeon: Katha Cabal, MD;  Location: [place] CV LAB;  Service: Cardiovascular;;    Family History  Problem Relation Age of Onset  . Diabetes Mother   . Hypertension Mother   . Diabetes Maternal Grandfather   . Hypertension Maternal Grandfather   . Breast cancer Neg Hx     Social History:  reports that she quit smoking about 4 years ago. Her smoking use included cigarettes. She has a 58.75 pack-year smoking history. She has never used smokeless tobacco. She reports that she does not drink alcohol and does not use drugs. She has a 21 pack year smoking history (1/2 pack/day from age [age]). The patient works at [employer] She is exposed to cold temperatures. She works 3rd shift. She has a daughter, [name] and a daughter named [name]. She lives in [place]. The patient is alone today.  Participants in the patient's visit and their role in the encounter included the patient and Tawni Carnes, RN today.  The intake visit was provided by Tawni Carnes, RN.  Allergies:  Allergies  Allergen Reactions  . Ciprofloxacin Swelling    Facial swelling following single oral dose.   . Codeine Anaphylaxis  . Nsaids Other (See Comments)    PT HAS HEPC    Current Medications: Current Outpatient Medications  Medication Sig Dispense Refill  . acetaminophen (TYLENOL) 650 MG CR tablet Take 650 mg by mouth every 8 (eight) hours as needed for pain.    [name] Kitchen albuterol (PROVENTIL HFA;VENTOLIN HFA) 108 (90 Base) MCG/ACT inhaler Inhale 2 puffs into the lungs every 6 (six) hours as needed for wheezing or shortness of breath.     . allopurinol (ZYLOPRIM) 100 MG tablet TAKE 1 TABLET BY MOUTH EVERY DAY 30 tablet 5  . clopidogrel (PLAVIX) 75 MG tablet Take 1 tablet (75 mg total) by mouth daily. 30 tablet 5  . cyanocobalamin 500 MCG tablet Take 500 mcg by mouth daily.    [name] Kitchen entecavir (BARACLUDE) 0.5 MG tablet Take 0.5 mg by mouth every other day.    . folic acid (FOLVITE) 1 MG tablet TAKE 1 TABLET (1 MG TOTAL) BY MOUTH DAILY. 90 tablet 1  . gabapentin (NEURONTIN) 100 MG capsule Take 1 capsule (100 mg total) by mouth 2 (two) times daily. 60 capsule 0  . hydroxychloroquine (PLAQUENIL) 200  MG tablet Take 200 mg by mouth 2 (two) times daily.    [name] Kitchen levothyroxine (SYNTHROID, LEVOTHROID) 75 MCG tablet Take 75 mcg by mouth daily before breakfast.     . omeprazole (PRILOSEC) 20 MG capsule Take 1 capsule (20 mg total) by mouth daily. 60 capsule 0  . oxyCODONE (ROXICODONE) 5 MG immediate release tablet Take 1 tablet (5 mg total) by mouth every 8 (eight) hours as needed for up to 3 days. 9 tablet 0  . pravastatin (PRAVACHOL) 40 MG tablet Take 40 mg by mouth every evening.    . predniSONE (DELTASONE) 2.5 MG tablet Take 1 tablet (2.5 mg total) by mouth every other day.    . valACYclovir (VALTREX) 1000 MG tablet Take 1 tablet (1,000 mg total) by mouth 3 (three) times daily for 7 days. 21 tablet 0  . [START ON 11/28/2020] valACYclovir (VALTREX) 500 MG tablet Take 1 tablet (500 mg  total) by mouth daily. 30 tablet 0  . diclofenac Sodium (VOLTAREN) 1 % GEL Apply 2 g topically 4 (four) times daily. (Patient not taking: Reported on 11/21/2020) 100 g 2   No current facility-administered medications for this visit.    Review of Systems  Constitutional: Negative for chills, diaphoresis, fever, malaise/fatigue and weight loss (no new weight).       Feels better.  HENT: Negative for congestion, ear discharge, ear pain, hearing loss, nosebleeds, sinus pain and sore throat.   Eyes: Negative.  Negative for blurred vision, double vision, photophobia and pain.  Respiratory: Negative.  Negative for cough, hemoptysis, sputum production, shortness of breath and wheezing.   Cardiovascular: Negative.  Negative for chest pain, palpitations, orthopnea and PND.  Gastrointestinal: Negative.  Negative for abdominal pain, blood in stool, constipation, diarrhea, heartburn, melena, nausea and vomiting.  Genitourinary: Negative.  Negative for dysuria, frequency, hematuria and urgency.  Musculoskeletal: Negative.  Negative for back pain, joint pain and neck pain.  Skin: Positive for rash.       Varicella rash is clearing.  Facial rash has improved.  Neurological: Negative for dizziness, tingling, tremors, sensory change, speech change, focal weakness, weakness and headaches.  Psychiatric/Behavioral: Negative for depression and memory loss. The patient is not nervous/anxious and does not have insomnia.    Performance status (ECOG): 0  Vital Signs There were no vitals taken for this visit.  Physical Exam Nursing note reviewed.  Constitutional:      General: She is not in acute distress.    Appearance: She is not ill-appearing.  HENT:     Head: Normocephalic.     Comments: Long gray hair.  Eyes:     General: No scleral icterus.    Extraocular Movements: Extraocular movements intact.     Conjunctiva/sclera: Conjunctivae normal.     Comments: Dark rimmed glasses.  Skin:    Findings: Rash  present.     Comments: Left L5 dermatome varicella rash; no vesicles.  Neurological:     Mental Status: She is alert and oriented to person, place, and time. Mental status is at baseline.  Psychiatric:        Mood and Affect: Mood normal.        Behavior: Behavior normal.        Thought Content: Thought content normal.        Judgment: Judgment normal.    Imaging studies: 07/07/2016:  Chest, abdomen, and pelvis CTangiogram revealed moderate diffuse atherosclerotic vascular disease of the abdominal aorta with severe stenosis of the left common iliac artery with  suspected short segment occlusion. There was no adenopathy. Spleen was normal.  04/15/2017:  Abdominal ultrasoundrevealed a normal spleen and sludge in the gallbladder. The liver was echogenic consistent fatty infiltration and/or hepatocellular disease. 11/10/2017:  Abdomen and pelvic CTrevealed a 2.2 cm spiculated density in right middle lobe concerning for malignancy.  11/16/2017:  Chest CTon 11/16/2017 revealed a 2.2 x 2.0 x 2.4 spiculated RIGHT middle lobe mass. There was tiny nonspecific upper lobe pulmonary nodules greater on RIGHT, largest 3 mm, of uncertain etiology. There was additional 8 x 8 mm opacity in the posterior sulcus of the RIGHT lower lobe. 11/22/2017:  PET scanrevealed a 2 cm hypermetabolic right middle lobe lung mass (SUV 3.4) consistent with primary lung neoplasm. There were no findings for mediastinal/hilar lymphadenopathy or metastatic disease. There were areas of hypermetabolism involving the left oblique abdominal muscles and the anorectal junction. 02/06/2018:  Chest CT revealed the spiculated 2.2 cm right middle lobe lesion was almost completely resolved with only a residual 4 mm irregular nodule identified at this location on a background of architectural distortion/evolving scar. There was a 5 mm right parahilar nodule stable since 11/16/2017(not present on 07/07/2016). There were tiny nodules  in both upper lobes suggesting inhalation etiology. 06/27/2019:  Chest CT revealed the nodule of the right middle lobe had almost completely resolved, with residual linear scarring. There was persistent paramedian consolidation of the medial right middle lobe and lingula, generally in keeping with atypical infection, particularly atypical mycobacterium. There had been interval increase in size of a small nodule of the right middle lobe, now measuring 4 mm (previously 2 mm).   There was new 6 mm ground-glass opacity of the lateral left upper lobe measuring.  Although nonspecific, these were likely infectious or inflammatory in nature.  Attention on follow-up was receommended. There was unchanged tiny biapical pulmonary nodules, benign sequelae of nonspecific infection or inflammation, possibly related cigarette smoking, atypical infection, or other inhalational, inflammatory lung disease. There was hepatic steatosis. 12/26/2019:  Chest CT on 12/26/2019 revealed stable sub-cm bilateral pulmonary nodules, consistent with benign etiology. There was stable paramedian consolidation in the right middle lobe and lingula. There was a new airspace disease in inferior right middle lobe, with a few adjacent subcentimeter nodules. Overall, above findings were consistent with waxing and waning atypical infectious process, such as MAI.   07/24/2020:  Low dose chest CT revealed Lung-RADS 2, benign appearance or behavior. There was emphysema, aortic atherosclerosis, and coronary artery calcifications.   Admission on 11/17/2020, Discharged on 11/20/2020  Component Date Value Ref Range Status  . Sodium 11/17/2020 138  135 - 145 mmol/L Final  . Potassium 11/17/2020 4.8  3.5 - 5.1 mmol/L Final  . Chloride 11/17/2020 106  98 - 111 mmol/L Final  . CO2 11/17/2020 25  22 - 32 mmol/L Final  . Glucose, Bld 11/17/2020 102* 70 - 99 mg/dL Final   Glucose reference range applies only to samples taken after fasting for at least 8  hours.  . BUN 11/17/2020 42* 8 - 23 mg/dL Final  . Creatinine, Ser 11/17/2020 2.56* 0.44 - 1.00 mg/dL Final  . Calcium 11/17/2020 9.0  8.9 - 10.3 mg/dL Final  . GFR, Estimated 11/17/2020 21* >60 mL/min Final   Comment: (NOTE) Calculated using the CKD-EPI Creatinine Equation (2021)   . Anion gap 11/17/2020 7  5 - 15 Final   Performed at [facility], [address] [place], [place] [address]  . WBC 11/17/2020 4.3  4.0 - 10.5 K/uL Final  .  RBC 11/17/2020 3.77* 3.87 - 5.11 MIL/uL Final  . Hemoglobin 11/17/2020 11.5* 12.0 - 15.0 g/dL Final  . HCT 11/17/2020 36.1  36.0 - 46.0 % Final  . MCV 11/17/2020 95.8  80.0 - 100.0 fL Final  . MCH 11/17/2020 30.5  26.0 - 34.0 pg Final  . MCHC 11/17/2020 31.9  30.0 - 36.0 g/dL Final  . RDW 11/17/2020 14.4  11.5 - 15.5 % Final  . Platelets 11/17/2020 127* 150 - 400 K/uL Final  . nRBC 11/17/2020 0.0  0.0 - 0.2 % Final  . Neutrophils Relative % 11/17/2020 58  % Final  . Neutro Abs 11/17/2020 2.5  1.7 - 7.7 K/uL Final  . Lymphocytes Relative 11/17/2020 23  % Final  . Lymphs Abs 11/17/2020 1.0  0.7 - 4.0 K/uL Final  . Monocytes Relative 11/17/2020 14  % Final  . Monocytes Absolute 11/17/2020 0.6  0.1 - 1.0 K/uL Final  . Eosinophils Relative 11/17/2020 3  % Final  . Eosinophils Absolute 11/17/2020 0.1  0.0 - 0.5 K/uL Final  . Basophils Relative 11/17/2020 2  % Final  . Basophils Absolute 11/17/2020 0.1  0.0 - 0.1 K/uL Final  . Immature Granulocytes 11/17/2020 0  % Final  . Abs Immature Granulocytes 11/17/2020 0.01  0.00 - 0.07 K/uL Final   Performed at [facility], [address], [place], [place] [address]  . Lactic Acid, Venous 11/17/2020 1.4  0.5 - 1.9 mmol/L Final   Performed at [facility], [address] [place], [place] [address]  . Lactic Acid, Venous 11/17/2020 1.2  0.5 - 1.9 mmol/L Final   Performed at [facility], [address] [place], [place] [address]  . Specimen Description 11/17/2020 BLOOD  BLOOD LEFT HAND   Final  . Special Requests 11/17/2020 BOTTLES DRAWN AEROBIC AND ANAEROBIC Blood Culture results may not be optimal due to an inadequate volume of blood received in culture bottles   Final  . Culture 11/17/2020    Final                   Value:NO GROWTH 5 DAYS Performed at [facility], [address], [place], [place] [address]   . Report Status 11/17/2020 11/22/2020 FINAL   Final  . Specimen Description 11/17/2020 BLOOD BLOOD RIGHT HAND   Final  . Special Requests 11/17/2020 BOTTLES DRAWN AEROBIC AND ANAEROBIC Blood Culture results may not be optimal due to an inadequate volume of blood received in culture bottles   Final  . Culture 11/17/2020    Final                   Value:NO GROWTH 5 DAYS Performed at [facility], [address] [place], [place] [address]   . Report Status 11/17/2020 11/22/2020 FINAL   Final  . Specimen Description 11/17/2020    Final                   Value:FOOT LEFT FOOT Performed at [facility], [address] [place], [place] [address]   . Special Requests 11/17/2020    Final                   Value:Immunocompromised Performed at [facility], [address] [place], [place] [address]   . Gram Stain 11/17/2020    Final                   Value:NO WBC SEEN NO ORGANISMS SEEN   .  Culture 11/17/2020    Final                   Value:NO GROWTH 4 DAYS NO ANAEROBES ISOLATED; CULTURE IN PROGRESS FOR 5 DAYS Performed at [facility], [place] [address], [place], [place] [address]   . Report Status 11/17/2020 PENDING   Incomplete  . SARS Coronavirus 2 11/17/2020 NEGATIVE  NEGATIVE Final   Comment: (NOTE) SARS-CoV-2 target nucleic acids are NOT DETECTED.  The SARS-CoV-2 RNA is generally detectable in upper and lower respiratory specimens during the acute phase of infection. Negative results do not preclude SARS-CoV-2 infection, do not rule out co-infections with other pathogens, and should not be used  as the sole basis for treatment or other patient management decisions. Negative results must be combined with clinical observations, patient history, and epidemiological information. The expected result is Negative.  Fact Sheet for Patients: SugarRoll.be  Fact Sheet for Healthcare Providers: https://www.woods-mathews.com/  This test is not yet approved or cleared by the [place] FDA and  has been authorized for detection and/or diagnosis of SARS-CoV-2 by FDA under an Emergency Use Authorization (EUA). This EUA will remain  in effect (meaning this test can be used) for the duration of the COVID-19 declaration under Se                          ction 564(b)(1) of the Act, 21 U.S.C. section 360bbb-3(b)(1), unless the authorization is terminated or revoked sooner.  Performed at [facility], [address], [place], [place] [address]   . Sed Rate 11/17/2020 21  0 - 30 mm/hr Final   Performed at [facility], [address] [place],  [address]  . Rhuematoid fact SerPl-aCnc 11/17/2020 <10.0  <14.0 IU/mL Final   Comment: (NOTE) Performed At: [facility] [place], [place] [id number] Rush Farmer MD XA:[id number]   . Complement C4, Body Fluid 11/17/2020 34  12 - 38 mg/dL Final   Comment: (NOTE) Performed At: [facility] [place], [place] [id number] Rush Farmer MD ZM:[id number]   . C3 Complement 11/17/2020 144  82 - 167 mg/dL Final   Comment: (NOTE) Performed At: [facility] [place], [place] [id number] Rush Farmer MD EX:[id number]   . Anti Nuclear Antibody (ANA) 11/17/2020 Positive* Negative Final   Comment: (NOTE) Performed At: [facility] [place], [place] [id number] Rush Farmer MD WG:[id number]   . Cryoglobulin 11/17/2020 Comment  None detected Final   Comment: (NOTE) None Detected at 72 hours This  test was developed and its performance characteristics determined by [facility]. It has not been cleared or approved by the Food and Drug Administration. Performed At: [facility] [place], [place] [id number] Rush Farmer MD PQ:[id number]   . Anticardiolipin IgA 11/17/2020 <9  0 - 11 APL U/mL Final   Comment: (NOTE)                          Negative:              <12                          Indeterminate:     12 - 20  Low-Med Positive: >20 - 80                          High Positive:         >80 Performed At: [facility] [address] [place], [place] [id number] Rush Farmer MD AS:[id number]   . Anticardiolipin IgG 11/17/2020 <9  0 - 14 GPL U/mL Final   Comment: (NOTE)                          Negative:              <15                          Indeterminate:     15 - 20                          Low-Med Positive: >20 - 80                          High Positive:         >80   . Anticardiolipin IgM 11/17/2020 15* 0 - 12 MPL U/mL Final   Comment: (NOTE)                          Negative:              <13                          Indeterminate:     13 - 20                          Low-Med Positive: >20 - 80                          High Positive:         >80   . PTT Lupus Anticoagulant 11/17/2020 38.8  0.0 - 51.9 sec Final  . DRVVT 11/17/2020 35.3  0.0 - 47.0 sec Final  . Phosphatydalserine, IgG 11/17/2020 <9  0 - 30 Units Final  . Phosphatydalserine, IgM 11/17/2020 33* 0 - 30 Units Final  . Phosphatydalserine, IgA 11/17/2020 3  0 - 19 APS Units Final  . Lupus Anticoag Interp 11/17/2020 Comment:   Corrected   No lupus anticoagulant was detected.  . Free T4 11/17/2020 0.96  0.61 - 1.12 ng/dL Final   Comment: (NOTE) Biotin ingestion may interfere with free T4 tests. If the results are inconsistent with the TSH level, previous test results, or the clinical presentation, then consider biotin interference. If needed, order  repeat testing after stopping biotin. Performed at [facility], [address], [place], [place] [address]   . TSH 11/17/2020 4.201  0.350 - 4.500 uIU/mL Final   Comment: Performed by a 3rd Generation assay with a functional sensitivity of <=0.01 uIU/mL. Performed at [facility], [address], [place], [place] [address]   . Vitamin B-12 11/17/2020 853  180 - 914 pg/mL Final   Comment: (NOTE) This assay is not validated for testing neonatal or myeloproliferative syndrome specimens for Vitamin B12 levels. Performed at [facility], [place] [address] [place], [place] [address]   .  Folate 11/17/2020 30.0  >5.9 ng/mL Final   Performed at [facility], [address] [place], [place] [address]  . Iron 11/17/2020 37  28 - 170 ug/dL Final  . TIBC 11/17/2020 290  250 - 450 ug/dL Final  . Saturation Ratios 11/17/2020 13  10.4 - 31.8 % Final  . UIBC 11/17/2020 253  ug/dL Final   Performed at [facility], [address], [place], [place] [address]  . Ferritin 11/17/2020 368* 11 - 307 ng/mL Final   Performed at [facility], [address] [place], [place] [address]  . Retic Ct Pct 11/17/2020 2.1  0.4 - 3.1 % Final  . RBC. 11/17/2020 4.00  3.87 - 5.11 MIL/uL Final  . Retic Count, Absolute 11/17/2020 83.6  19.0 - 186.0 K/uL Final  . Immature Retic Fract 11/17/2020 8.4  2.3 - 15.9 % Final   Performed at [facility], [address], [place], [place] [address]  . ABO/RH(D) 11/17/2020 A NEG   Final  . Antibody Screen 11/17/2020 POS   Final  . Sample Expiration 11/17/2020 11/20/2020,2359   Final  . Antibody Identification 92/42/6834 WARM AUTOANTIBODY   Final  . DAT, IgG 11/17/2020 POS   Final  . DAT, complement 11/17/2020 NEG   Final  . Unit Number 11/17/2020 [id number]   Final  . Blood Component Type 11/17/2020 RBC LR PHER1   Final  . Unit division 11/17/2020 00   Final  . Status of Unit 11/17/2020 REL FROM [facility]   Final   . Transfusion Status 11/17/2020 OK TO TRANSFUSE   Final  . Crossmatch Result 11/17/2020 COMPATIBLE   Final  . Unit Number 11/17/2020 [id number]   Final  . Blood Component Type 11/17/2020 RBC LR PHER2   Final  . Unit division 11/17/2020 00   Final  . Status of Unit 11/17/2020 REL FROM [facility]   Final  . Transfusion Status 11/17/2020 OK TO TRANSFUSE   Final  . Crossmatch Result 11/17/2020 COMPATIBLE   Final  . Blood Product Unit Number 11/17/2020 [id number]   Final  . PRODUCT CODE 11/17/2020 [id number]   Final  . Unit Type and Rh 11/17/2020 0600   Final  . Blood Product Expiration Date 11/17/2020 [id number]   Final  . Blood Product Unit Number 11/17/2020 [id number]   Final  . PRODUCT CODE 11/17/2020 [id number]   Final  . Unit Type and Rh 11/17/2020 0600   Final  . Blood Product Expiration Date 11/17/2020 [id number]   Final  . CRP 11/18/2020 0.9  <1.0 mg/dL Final   Performed at [facility], [place] [address] [place], [place] [address]  . Total CK 11/18/2020 74  38 - 234 U/L Final   Performed at [facility], [address] [place], [place] [address]  . Sodium 11/18/2020 140  135 - 145 mmol/L Final  . Potassium 11/18/2020 4.7  3.5 - 5.1 mmol/L Final  . Chloride 11/18/2020 110  98 - 111 mmol/L Final  . CO2 11/18/2020 25  22 - 32 mmol/L Final  . Glucose, Bld 11/18/2020 90  70 - 99 mg/dL Final   Glucose reference range applies only to samples taken after fasting for at least 8 hours.  . BUN 11/18/2020 25* 8 - 23 mg/dL Final  . Creatinine, Ser 11/18/2020 1.28* 0.44 - 1.00 mg/dL Final  . Calcium 11/18/2020 8.6* 8.9 - 10.3 mg/dL Final  . GFR, Estimated 11/18/2020 47* >60 mL/min Final   Comment: (NOTE) Calculated using the CKD-EPI  Creatinine Equation (2021)   . Anion gap 11/18/2020 5  5 - 15 Final   Performed at [facility], [address] [place], [place] [address]  . WBC 11/18/2020 3.0* 4.0 - 10.5 K/uL Final  . RBC 11/18/2020 3.61* 3.87 - 5.11 MIL/uL  Final  . Hemoglobin 11/18/2020 10.9* 12.0 - 15.0 g/dL Final  . HCT 11/18/2020 34.0* 36.0 - 46.0 % Final  . MCV 11/18/2020 94.2  80.0 - 100.0 fL Final  . MCH 11/18/2020 30.2  26.0 - 34.0 pg Final  . MCHC 11/18/2020 32.1  30.0 - 36.0 g/dL Final  . RDW 11/18/2020 14.2  11.5 - 15.5 % Final  . Platelets 11/18/2020 93* 150 - 400 K/uL Final   Comment: Immature Platelet Fraction may be clinically indicated, consider ordering this additional test [id number]   . nRBC 11/18/2020 0.0  0.0 - 0.2 % Final   Performed at [facility], [address] [place], [place] [address]  . Varicella-Zoster, PCR 11/18/2020 Positive* Negative Final   Comment: (NOTE) Varicella Zoster Virus DNA detected. This test was developed and its performance characteristics determined by [facility].  It has not been cleared or approved by the Food and Drug Administration.  The FDA has determined that such clearance or approval is not necessary. Performed At: [facility] [place], [place] [id number] Rush Farmer MD DJ:[id number]   . ds DNA Ab 11/17/2020 <1  0 - 9 IU/mL Final   Comment: (NOTE)                                   Negative      <5                                   Equivocal  5 - 9                                   Positive      >9   . Ribonucleic Protein 11/17/2020 1.0* 0.0 - 0.9 AI Final  . ENA SM Ab Ser-aCnc 11/17/2020 <0.2  0.0 - 0.9 AI Final  . Scleroderma (Scl-70) (ENA) Antibod* 11/17/2020 <0.2  0.0 - 0.9 AI Final  . SSA (Ro) (ENA) Antibody, IgG 11/17/2020 <0.2  0.0 - 0.9 AI Final  . SSB (La) (ENA) Antibody, IgG 11/17/2020 <0.2  0.0 - 0.9 AI Final  . Chromatin Ab SerPl-aCnc 11/17/2020 <0.2  0.0 - 0.9 AI Final  . Anti JO-1 11/17/2020 <0.2  0.0 - 0.9 AI Final  . Centromere Ab Screen 11/17/2020 <0.2  0.0 - 0.9 AI Final  . See below: 11/17/2020 Comment   Final   Comment: (NOTE) Autoantibody                       Disease  Association ------------------------------------------------------------                        Condition                  Frequency ---------------------   ------------------------   --------- Antinuclear Antibody,    SLE, mixed connective Direct (ANA-D)           tissue diseases ---------------------   ------------------------   ---------  dsDNA                    SLE                        40 - 60% ---------------------   ------------------------   --------- Chromatin                Drug induced SLE                90%                         SLE                        48 - 97% ---------------------   ------------------------   --------- SSA (Ro)                 SLE                        25 - 35%                         Sjogren's Syndrome         40 - 70%                         Neonatal Lupus                 100% ---------------------   ------------------------   --------- SSB (La)                 SLE                                                       10%                         Sjogren's Syndrome              30% ---------------------   -----------------------    --------- Sm (anti-Smith)          SLE                        15 - 30% ---------------------   -----------------------    --------- RNP                      Mixed Connective Tissue                         Disease                         95% (U1 nRNP,                SLE                        30 - 50% anti-ribonucleoprotein)  Polymyositis and/or                         Dermatomyositis  20% ---------------------   ------------------------   --------- Scl-70 (antiDNA          Scleroderma (diffuse)      20 - 35% topoisomerase)           Crest                           13% ---------------------   ------------------------   --------- Jo-1                     Polymyositis and/or                         Dermatomyositis            20 - 40% ---------------------   ------------------------   --------- Centromere  B             Scleroderma -                           Crest                         variant                         80% Performed At: [facility] [employer] [address] [place], [place] [id number] Rush Farmer MD SP:[id number]   . CRP 11/19/2020 0.8  <1.0 mg/dL Final   Performed at [facility] [address], [place], [place] [address]  . Sodium 11/19/2020 139  135 - 145 mmol/L Final  . Potassium 11/19/2020 4.3  3.5 - 5.1 mmol/L Final  . Chloride 11/19/2020 110  98 - 111 mmol/L Final  . CO2 11/19/2020 25  22 - 32 mmol/L Final  . Glucose, Bld 11/19/2020 92  70 - 99 mg/dL Final   Glucose reference range applies only to samples taken after fasting for at least 8 hours.  . BUN 11/19/2020 17  8 - 23 mg/dL Final  . Creatinine, Ser 11/19/2020 0.99  0.44 - 1.00 mg/dL Final  . Calcium 11/19/2020 9.0  8.9 - 10.3 mg/dL Final  . GFR, Estimated 11/19/2020 >60  >60 mL/min Final   Comment: (NOTE) Calculated using the CKD-EPI Creatinine Equation (2021)   . Anion gap 11/19/2020 4* 5 - 15 Final   Performed at [facility], [address] [place], [place] [address]  . WBC 11/19/2020 3.4* 4.0 - 10.5 K/uL Final  . RBC 11/19/2020 3.58* 3.87 - 5.11 MIL/uL Final  . Hemoglobin 11/19/2020 10.8* 12.0 - 15.0 g/dL Final  . HCT 11/19/2020 33.8* 36.0 - 46.0 % Final  . MCV 11/19/2020 94.4  80.0 - 100.0 fL Final  . MCH 11/19/2020 30.2  26.0 - 34.0 pg Final  . MCHC 11/19/2020 32.0  30.0 - 36.0 g/dL Final  . RDW 11/19/2020 14.0  11.5 - 15.5 % Final  . Platelets 11/19/2020 103* 150 - 400 K/uL Final   Comment: Immature Platelet Fraction may be clinically indicated, consider ordering this additional test [id number]   . nRBC 11/19/2020 0.0  0.0 - 0.2 % Final   Performed at [facility], [address] [place], [place] [address]  . CRP 11/20/2020 0.7  <1.0 mg/dL Final   Performed at [place] [address] [place],  [address]  . WBC 11/20/2020 2.8* 4.0 - 10.5 K/uL  Final  . RBC 11/20/2020 3.51* 3.87 - 5.11 MIL/uL Final  . Hemoglobin 11/20/2020 10.7* 12.0 - 15.0 g/dL Final  . HCT 11/20/2020 32.7* 36.0 - 46.0 % Final  . MCV 11/20/2020 93.2  80.0 - 100.0 fL Final  . MCH 11/20/2020 30.5  26.0 - 34.0 pg Final  . MCHC 11/20/2020 32.7  30.0 - 36.0 g/dL Final  . RDW 11/20/2020 14.0  11.5 - 15.5 % Final  . Platelets 11/20/2020 98* 150 - 400 K/uL Final   Comment: Immature Platelet Fraction may be clinically indicated, consider ordering this additional test [id number]   . nRBC 11/20/2020 0.0  0.0 - 0.2 % Final   Performed at [facility], [address] [place], [place] [address]  . Sodium 11/20/2020 139  135 - 145 mmol/L Final  . Potassium 11/20/2020 4.0  3.5 - 5.1 mmol/L Final  . Chloride 11/20/2020 108  98 - 111 mmol/L Final  . CO2 11/20/2020 26  22 - 32 mmol/L Final  . Glucose, Bld 11/20/2020 86  70 - 99 mg/dL Final   Glucose reference range applies only to samples taken after fasting for at least 8 hours.  . BUN 11/20/2020 16  8 - 23 mg/dL Final  . Creatinine, Ser 11/20/2020 1.05* 0.44 - 1.00 mg/dL Final  . Calcium 11/20/2020 9.0  8.9 - 10.3 mg/dL Final  . GFR, Estimated 11/20/2020 >60  >60 mL/min Final   Comment: (NOTE) Calculated using the CKD-EPI Creatinine Equation (2021)   . Anion gap 11/20/2020 5  5 - 15 Final   Performed at [facility], [address] [place], [place] [address]  . Color, Urine 11/19/2020 YELLOW* YELLOW Final  . APPearance 11/19/2020 CLEAR* CLEAR Final  . Specific Gravity, Urine 11/19/2020 1.019  1.005 - 1.030 Final  . pH 11/19/2020 5.0  5.0 - 8.0 Final  . Glucose, UA 11/19/2020 NEGATIVE  NEGATIVE mg/dL Final  . Hgb urine dipstick 11/19/2020 NEGATIVE  NEGATIVE Final  . Bilirubin Urine 11/19/2020 NEGATIVE  NEGATIVE Final  . Ketones, ur 11/19/2020 NEGATIVE  NEGATIVE mg/dL Final  . Protein, ur 11/19/2020 NEGATIVE  NEGATIVE mg/dL Final  . Nitrite 11/19/2020 NEGATIVE  NEGATIVE Final  . Chalmers Guest  11/19/2020 NEGATIVE  NEGATIVE Final   Performed at [facility], [address], [place], [place] [address]  . Specimen Description 11/19/2020 BLOOD LEFT ANTECUBITAL   Final  . Special Requests 11/19/2020 BOTTLES DRAWN AEROBIC AND ANAEROBIC Blood Culture adequate volume   Final  . Culture 11/19/2020    Final                   Value:NO GROWTH 3 DAYS Performed at [facility], [address] [place], [place] [address]   . Report Status 11/19/2020 PENDING   Incomplete  . Specimen Description 11/19/2020 BLOOD BLOOD LEFT HAND   Final  . Special Requests 11/19/2020 BOTTLES DRAWN AEROBIC AND ANAEROBIC Blood Culture adequate volume   Final  . Culture 11/19/2020    Final                   Value:NO GROWTH 3 DAYS Performed at [facility], [address] [place], [place] [address]   . Report Status 11/19/2020 PENDING   Incomplete    Assessment:  Tina Page is a 65 y.o. 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 One week prior to presentation, she felt like she was coming down with something. She denied any fever,  runny nose, sore throat or cough. She had some diarrhea. She denied any new medications or herbal products.  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 Reticulocyte count was 11.3%. Ferritin was 562. Iron studies revealed a saturation of 20% and a TIBC of 214 (low). B12 was 231 (low normal). Folate was 42. Peripheral smear revealed rouleaux formation. Labs on 06/21/2019revealed normal flow cytometry, SPEP, and immunoglobulins.  Additional testingincluded the following+ studies: hepatitis C antibody,hepatitis B core antibody,CMV IgM, and EBV VCA (IgM and IgG). Hepatitis B by PCRwas negative. Hepatitis C RNAwas negative. Mycoplasma pneumonia IgM was negative. Reticulocyte count was 11.3% (high) indicating appropriate marrow response. C3 and C4 were normal.  Cold agglutinin titer was negative x 2. Negative studies included: ANA, hepatitis B surface antigen, SPEP, and free light chain ratio. 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 MMA was initially 330 (normal). Repeat MMA was elevated on 08/01/2016 confirming B12 deficiency. Hepatitis B surface antibody was positive and hepatitis B surface antigen was negative on 08/06/2016.  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 There was no adenopathy. Spleen was normal. Abdominal ultrasoundon 04/15/2017 revealed a normal spleen and sludge in the gallbladder. The liver was echogenic consistent fatty infiltration and/or hepatocellular disease.  She underwent PTCA and stent placementin right and left common iliac arteries and left external iliac artery on 07/10/2016.She is on Plavix.EGD on 03/19/2015 revealed gastritis in the body and antrum. Colonoscopyon 03/19/2015 revealed one hyperplastic polyp. EGDon 07/08/2016 was normal. No evidence of bleeding.  She has received6 units of warmed PRBCsto date (last on 08/01/2016).If Rituxan is needed, she requires entecavir 0.5 mg q day beginning 2 weeks prior to Rituxan. In addition, hepatitis B viral level and LFTs should be checked monthly.  She began steroids(1 mg/kg) on 08/01/2016. She stopped prednisoneon 03/12/2017. She is onfolic acid1 mg a day.   She hasB12 deficiency. B12 was 231 on 07/09/2016. She is on oral B12. B12 and folate were normal on 03/09/2017 and 10/12/2017.  She developed peri-oral and intranasalherpes simplex-1. She was treated with valacyclovir and doxycycline on 10/19/2016. She developed a transient elevated alkaline phosphataseon 10/19/2016 which subsequently normalized.  She was  documented to have awarm autoantibodyon 10/12/2017. LDH and bilirubin were normal. Hematocrit improved. She began prednisone10 mg on 12/07/2017 (discontinued 03/2018).She received Rituxanweekly x 4 (last dose 06/16/2018).  Entecavir was discontinued on 07/06/2019.  She developedflu like symptomson 10/26/2017. Symptoms included cough, myalgias, and fever (tmax unknown). She was prescribed Mucinex, Tamiflu, and amoxicillin. She only took amoxicillin x 5 days. She developedincreased liver function testson 11/02/2017. CMV IgG was positive. EBV VCA IgG, NA IgG, early antigen antibody IgG were elevated. EBV VCA IgM was <36. Testing was c/w a convalescence/past infection or reactivated infection. LFTs normalized on 11/15/2017. Smooth muscle antibodywas 39 (high) on 11/10/2017 and 34 (high) on 11/30/2017.   Labs on 06/12/2020 revealed recurrent anemia.  Hematocrit was 27.6, hemoglobin 8.4, MCV 104.5, platelets 138,000, WBC 3,400 (ANC 2,300). Creatinine was 1.17 (CrCl 50 ml/min).  Normal labs included ferritin (434), iron studies, B12 (885), folate (> 100).  Coombs polyspecific [facility]GH test was positive (warm autoantibody).  Reticulocyte count was 7.1%.  LDH was 204 (98-192).  Peripheral smear revealed macrocytic anemia with mild thrombocytopenia. The morphology of RBCs, WBCs, and platelets were within normal limits. There was no evidence of circulating blasts  or schistocytes.  She began prednisone 1 mg/kg (80 mg) on 06/16/2020.  Prednisone was decreased to 20 mg a day on 111/24/2021.  She received Rituxan 1000 mg on day 1 and 15 (07/15/2020 and 07/29/2020).  She began entecavir on 06/28/2020.  She is on prednisone 2.5 mgQOD.  She is on Septra for PCP prophylaxis.  Septra is currently on hold.   Chest CTon 11/16/2017 revealed a 2.2 x 2.0 x 2.4 spiculated RIGHT middle lobe mass. There was tiny nonspecific upper lobe pulmonary nodules greater on RIGHT, largest 3 mm, of uncertain etiology.  There was additional 8 x 8 mm opacity in the posterior sulcus of the RIGHT lower lobe. PET scanon 11/22/2017 revealed a 2 cm hypermetabolic right middle lobe lung mass (SUV 3.4) consistent with primary lung neoplasm. There were no findings for mediastinal/hilar lymphadenopathy or metastatic disease. There were areas of hypermetabolism involving the left oblique abdominal muscles and the anorectal junction.  PFTs on 11/18/2017 revealed an FEV1 of 1.22 liters (51%). DLCO adj was 6.8 mL/mmHg/min (32%).  ENB done on 12/07/2017. Cytology was negative for malignancy. Pathologydemonstrated organizing pneumonia and chronic bronchitis. Pathologist commented that organizing pneumonia spanned about 2 mm in one fragment. Cases of focal organizing pneumonia with hypermetabolism on FDG-PET have been described, but changes of this type can also be adjacent to a neoplasm. Patient prescribed a daily dose of Prednisone 10 mg, with re-imaging planned in 6-8 weeks.   Low dose chest CT on 07/24/2020 revealed Lung-RADS 2, benign appearance or behavior. There was emphysema, aortic atherosclerosis, and coronary artery calcifications.   She has diarrhea. 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 She received azithromycin x 3 days. She received ciprofloxacin x 10 days without improvement. She has seen ID in [place]. She began Bactrim on 03/04/2018 (discontinued on 03/11/2018) secondary to increase in creatine.  She has renal insufficiency. Creatininehas been followed: 1.17 on 11/05/2017, 1.85 on 11/09/2017, 1.38 on 11/12/2017, 1.74 on 11/15/2017, 1.66 on 11/17/2017, and 1.48 on 11/23/2017. Urinalysis on 11/15/2017 revealed revealed no hemoglobin, bilirubin or active sediment.  Liver function tests increased on 02/15/2019.  Work-up on 02/16/2019 revealed hepatitis B E antibody positive.  Negative studies included: hepatitis A total antibody, hepatitis A IgM, hepatitis B  surface antigen, hepatitis B E antigen.  Hepatitis B DNA was not detected.  Folate was 80.5 and vitamin B12 was 443.  She has received her vaccinations:  She has completed the PCV13, PPSV23, and HiB.  She received Menvio (quadrivalent meningoccal vaccine) and Bexero (univalent serogroup B meningoccal vaccine) on 07/06/2019.  She was admitted to [facility] from 07/16/2020 - 07/18/2020 with a fever and UTI.  Blood and urine culture grew E coli.  She received ceftriaxone and was discharged on Septra DS 1 tablet BID.  Septra was switched to Keflex secondary to renal insufficiency.  She was diagnosed with C difficile + diarrhea on 08/12/2020.  She completed a course of oral vancomycin.  Stool was negative for C. difficile on 08/27/2020.  She was admitted to [facility] from 11/17/2020 - 11/20/2020 with left L5 varicella zoster.  Lesion was positive for VZV. She was treated with ceftazidime and a valacyclovir.  She was discharged on 9 days of Valtrex 1 gm 3 times daily followed by 500 mg daily indefinitely for suppression and doxycycline.  She notes that she tested positive for COVID-19 on 08/21/2019.  She received the Moderna COVID-19 vaccine on 11/10/2019 and 12/08/2019.   Symptomatically, he varicella rash is resolving.  She denies any  fever.  She is on prednisone 2.5 mg QOD.  She remains on entecavir QOD.  She is on valacyclovir 1 gm TID trough 11/27/2020 then will begin prophylactic valacyclovir. WBC was 2800 and platelet count 98,000 on 11/20/2020.  Plan: 1.   Review labs during hospitalization. 2.Warm autoimmune hemolytic anemia Clinically,she is doing fairly well       She active warm autoimmune hemolytic anemia responding well to Rituxan and a steroid taper. Hematocrit 27.6.  Hemoglobin   8.4.  LDH 204 (98-192) on 06/12/2020.                   Work-up confirmed warm autoantibodies.       Hematocrit 30.4.  Hemoglobin   9.2.  LDH 164 (98-192) on 06/19/2020.                   She began  prednisone 1 mg/kg (80 mg) on 06/16/2020.       Hematocrit 32.7.  Hemoglobin 10.7 on 11/20/2020.   LDH was 173 on 10/31/2020.       She is s/p day 1 and 15 Rituxan (last 07/29/2020).       She is on prednisone 2.5 mgQOD.       Discuss plans to taper off steroids next week if cortisol level normal.   Review plan to hold prednisone on 11/27/2020 in AM with early blood draw then take prednisone.   Patient will be contacted with AM cortisol level.  Continue to monitor closely. 3.Hepatitis B Patient was previously exposed to hepatitis B. She was on entecavir x 1 year when she previously received Rituxan (stopped 07/06/2019). Patient did not complete her meningococcal vaccines.       She began entecavir 0.5 mg daily on 06/28/2020.                   Dose adjusted based on renal function.                   CrCl 30-49 ml/min   50% dosing (0.5 mg QOD).                   CrCl >= 50 ml/min 100% dosing (0.5 mg a day).  Creatinine 1.05 on 11/20/2020.  Patient currently on entecavir QOD.  Recheck creatinine next week and adjust dose if needed. 4.B12 deficiency B12 was 443 on 02/15/2019 and 885 on 06/12/2020.             Folate was 80.5 on 02/15/2019 and >100 on 06/12/2020.                         Folate was 92.0 on 08/19/2020.             She is currently on folic acid 1 mg a week.  Continue to monitor. 5. Right middle lobe nodule             Chest CT on 12/26/2019 revealed stable sub-cm bilateral pulmonary nodules c/w benign etiology.                          There was a new airspace disease in inferior right middle lobe, with a few adjacent subcentimeter nodules.                          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  Low-dose chest CT on 07/24/2020 revealedLung-RADS 2, benign appearance or behavior.             Patient is followed by Dr Mortimer Fries in pulmonary medicine. 6.   Left L5 varicella voster              Review recent admission.  Patient currently on valacyclovir 1000 mg TID with plan for indefinite valacyclovir 500 mg a day.  Patient to follow-up with Dr Delaine Lame on 11/28/2020. 7.   PCP prophylaxis             Patient was on Septra DS on Mondays, Wednesdays, and Fridays.  Patient's Septra currently on hold secondary to renal insufficiency documented during hsopitalization.  Anticipate restarting Septra or possibly daily dapsone next week. 8.   Thrombocytopenia and leukopenia  Etiology likely secondary to current infection and underlying autoimmune disease.  If platelet count requires treatment, consider IVIG.  Continue to monitor closely. 9.   RTC on 11/27/2020 at 8 AM for labs (CBC with diff, CMP, cortisol). 10.   RN: Call patient with cortisol level and steroid dose. 11.   RN: Assist with FMLA paperwork. 12.   RTC on 12/04/2020 for MD assesments and labs (CBC with diff, CMP).  I discussed the assessment and treatment plan with the patient.  The patient was provided an opportunity to ask questions and all were answered.  The patient agreed with the plan and demonstrated an understanding of the instructions.  The patient was advised to call back or seek an in person evaluation if the symptoms worsen or if the condition fails to improve as anticipated.  I provided 23 minutes (10:45 AM - 11:08 AM) of non face-to-face video visit time during this this encounter and > 50% was spent counseling as documented under my assessment and plan.  I provided these services from home.  An additional 10 minutes were spent reviewing her chart (Epic and Care Everywhere) including notes, labs, and imaging studies.    Nolon Stalls, MD, PhD  11/22/2020, 10:02 AM

## 2020-11-24 LAB — CULTURE, BLOOD (ROUTINE X 2)
Culture: NO GROWTH
Culture: NO GROWTH
Special Requests: ADEQUATE
Special Requests: ADEQUATE

## 2020-11-25 ENCOUNTER — Encounter: Payer: Self-pay | Admitting: *Deleted

## 2020-11-25 NOTE — Telephone Encounter (Signed)
Mychart msg sent to patient re: FMLA

## 2020-11-25 NOTE — Telephone Encounter (Signed)
Patient coming on 11/27/20 for labs

## 2020-11-27 ENCOUNTER — Inpatient Hospital Stay: Payer: BC Managed Care – PPO

## 2020-11-27 ENCOUNTER — Other Ambulatory Visit: Payer: Self-pay

## 2020-11-27 DIAGNOSIS — E538 Deficiency of other specified B group vitamins: Secondary | ICD-10-CM | POA: Diagnosis not present

## 2020-11-27 DIAGNOSIS — N289 Disorder of kidney and ureter, unspecified: Secondary | ICD-10-CM | POA: Diagnosis not present

## 2020-11-27 DIAGNOSIS — R918 Other nonspecific abnormal finding of lung field: Secondary | ICD-10-CM | POA: Diagnosis not present

## 2020-11-27 DIAGNOSIS — N1832 Chronic kidney disease, stage 3b: Secondary | ICD-10-CM

## 2020-11-27 DIAGNOSIS — D72819 Decreased white blood cell count, unspecified: Secondary | ICD-10-CM

## 2020-11-27 DIAGNOSIS — Z8249 Family history of ischemic heart disease and other diseases of the circulatory system: Secondary | ICD-10-CM | POA: Diagnosis not present

## 2020-11-27 DIAGNOSIS — D5911 Warm autoimmune hemolytic anemia: Secondary | ICD-10-CM | POA: Diagnosis not present

## 2020-11-27 DIAGNOSIS — I10 Essential (primary) hypertension: Secondary | ICD-10-CM | POA: Diagnosis not present

## 2020-11-27 DIAGNOSIS — Z87891 Personal history of nicotine dependence: Secondary | ICD-10-CM | POA: Diagnosis not present

## 2020-11-27 DIAGNOSIS — Z833 Family history of diabetes mellitus: Secondary | ICD-10-CM | POA: Diagnosis not present

## 2020-11-27 DIAGNOSIS — L930 Discoid lupus erythematosus: Secondary | ICD-10-CM | POA: Diagnosis not present

## 2020-11-27 DIAGNOSIS — E039 Hypothyroidism, unspecified: Secondary | ICD-10-CM | POA: Diagnosis not present

## 2020-11-27 DIAGNOSIS — D591 Autoimmune hemolytic anemia, unspecified: Secondary | ICD-10-CM

## 2020-11-27 LAB — CBC WITH DIFFERENTIAL/PLATELET
Abs Immature Granulocytes: 0.01 10*3/uL (ref 0.00–0.07)
Basophils Absolute: 0 10*3/uL (ref 0.0–0.1)
Basophils Relative: 1 %
Eosinophils Absolute: 0.1 10*3/uL (ref 0.0–0.5)
Eosinophils Relative: 3 %
HCT: 36.1 % (ref 36.0–46.0)
Hemoglobin: 11.7 g/dL — ABNORMAL LOW (ref 12.0–15.0)
Immature Granulocytes: 0 %
Lymphocytes Relative: 25 %
Lymphs Abs: 1 10*3/uL (ref 0.7–4.0)
MCH: 30.1 pg (ref 26.0–34.0)
MCHC: 32.4 g/dL (ref 30.0–36.0)
MCV: 92.8 fL (ref 80.0–100.0)
Monocytes Absolute: 0.5 10*3/uL (ref 0.1–1.0)
Monocytes Relative: 12 %
Neutro Abs: 2.4 10*3/uL (ref 1.7–7.7)
Neutrophils Relative %: 59 %
Platelets: 139 10*3/uL — ABNORMAL LOW (ref 150–400)
RBC: 3.89 MIL/uL (ref 3.87–5.11)
RDW: 14.8 % (ref 11.5–15.5)
WBC: 4.1 10*3/uL (ref 4.0–10.5)
nRBC: 0 % (ref 0.0–0.2)

## 2020-11-27 LAB — COMPREHENSIVE METABOLIC PANEL
ALT: 13 U/L (ref 0–44)
AST: 18 U/L (ref 15–41)
Albumin: 4.3 g/dL (ref 3.5–5.0)
Alkaline Phosphatase: 63 U/L (ref 38–126)
Anion gap: 5 (ref 5–15)
BUN: 24 mg/dL — ABNORMAL HIGH (ref 8–23)
CO2: 29 mmol/L (ref 22–32)
Calcium: 9.4 mg/dL (ref 8.9–10.3)
Chloride: 106 mmol/L (ref 98–111)
Creatinine, Ser: 0.99 mg/dL (ref 0.44–1.00)
GFR, Estimated: >60 mL/min (ref >60)
Glucose, Bld: 86 mg/dL (ref 70–99)
Potassium: 4.3 mmol/L (ref 3.5–5.1)
Sodium: 140 mmol/L (ref 135–145)
Total Bilirubin: 0.5 mg/dL (ref 0.3–1.2)
Total Protein: 7 g/dL (ref 6.5–8.1)

## 2020-11-27 LAB — CORTISOL: Cortisol, Plasma: 11.3 ug/dL

## 2020-11-27 NOTE — Telephone Encounter (Signed)
OK to stop prednisone 2.5 mg QOD if she followed the instructions for testing -per Dr. Mike Gip

## 2020-11-27 NOTE — Telephone Encounter (Signed)
Tina Page - please call patient to let her know to stop the prednisone

## 2020-11-27 NOTE — Telephone Encounter (Signed)
Patient dropped off FMLA papers. She would like continuous leave from 11/17/20 to 12/08/20

## 2020-11-28 ENCOUNTER — Ambulatory Visit: Payer: BC Managed Care – PPO | Attending: Infectious Diseases | Admitting: Infectious Diseases

## 2020-11-28 ENCOUNTER — Encounter: Payer: Self-pay | Admitting: Infectious Diseases

## 2020-11-28 VITALS — BP 125/77 | HR 92 | Temp 97.2°F | Wt 179.0 lb

## 2020-11-28 DIAGNOSIS — I129 Hypertensive chronic kidney disease with stage 1 through stage 4 chronic kidney disease, or unspecified chronic kidney disease: Secondary | ICD-10-CM | POA: Diagnosis not present

## 2020-11-28 DIAGNOSIS — B029 Zoster without complications: Secondary | ICD-10-CM

## 2020-11-28 DIAGNOSIS — Z7902 Long term (current) use of antithrombotics/antiplatelets: Secondary | ICD-10-CM | POA: Diagnosis not present

## 2020-11-28 DIAGNOSIS — L930 Discoid lupus erythematosus: Secondary | ICD-10-CM | POA: Diagnosis not present

## 2020-11-28 DIAGNOSIS — D696 Thrombocytopenia, unspecified: Secondary | ICD-10-CM | POA: Diagnosis not present

## 2020-11-28 DIAGNOSIS — Z79899 Other long term (current) drug therapy: Secondary | ICD-10-CM | POA: Insufficient documentation

## 2020-11-28 DIAGNOSIS — Z09 Encounter for follow-up examination after completed treatment for conditions other than malignant neoplasm: Secondary | ICD-10-CM | POA: Insufficient documentation

## 2020-11-28 DIAGNOSIS — Z8619 Personal history of other infectious and parasitic diseases: Secondary | ICD-10-CM | POA: Diagnosis not present

## 2020-11-28 DIAGNOSIS — D5911 Warm autoimmune hemolytic anemia: Secondary | ICD-10-CM | POA: Insufficient documentation

## 2020-11-28 DIAGNOSIS — B1910 Unspecified viral hepatitis B without hepatic coma: Secondary | ICD-10-CM | POA: Insufficient documentation

## 2020-11-28 DIAGNOSIS — Z79890 Hormone replacement therapy (postmenopausal): Secondary | ICD-10-CM | POA: Diagnosis not present

## 2020-11-28 DIAGNOSIS — M359 Systemic involvement of connective tissue, unspecified: Secondary | ICD-10-CM | POA: Insufficient documentation

## 2020-11-28 DIAGNOSIS — Z7952 Long term (current) use of systemic steroids: Secondary | ICD-10-CM | POA: Diagnosis not present

## 2020-11-28 DIAGNOSIS — Z87891 Personal history of nicotine dependence: Secondary | ICD-10-CM | POA: Insufficient documentation

## 2020-11-28 DIAGNOSIS — N189 Chronic kidney disease, unspecified: Secondary | ICD-10-CM | POA: Insufficient documentation

## 2020-11-28 NOTE — Progress Notes (Signed)
NAME: Tina Page  DOB: July 02, 1956  MRN: [id number]  Date/Time: 11/28/2020 10:00 AM   Subjective:  Follow up visit -pt was recently in the hospital between 3/20-/22-3/23/22 for left leg lesion which turned out to be herpes zoster ? Tina Page is a 65 y.o. 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  As per patient she was in [place] last week, had been on a cruise and then stayed in [place] for 1 day, was in the water , lost balance  she fell  and got up , she did not notice any wound or bruise. She did not fall on any coral reef, no scratches or bleeding immediately after.no bites,   In a day or so she noted small spots on her back and it was painful The lesions were on her left leg and looked like herpes zoster- started on IV acyclovir, PCR positive for VZV Sent home to complete 10 days of valtrex which she finished Doing better- still some tingling and pain left leg , between her toes Saw Dr.Corcoran yesterday   Past Medical History:  Diagnosis Date  . Anemia   . Atherosclerosis of native arteries of extremity with intermittent claudication ([place]) 07/20/2016  . COPD (chronic obstructive pulmonary disease) ([place])   . 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 Elevated liver function tests 11/03/2017  . GERD (gastroesophageal reflux disease)   . Heme positive stool 01/29/2015  . Hepatitis C 07/12/2016  . HLD (hyperlipidemia)   . Hypertension   . Hypothyroidism   . 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 Thrombocytopenia ([place]) 10/04/2016    Past Surgical History:  Procedure Laterality Date  . ELECTROMAGNETIC NAVIGATION BROCHOSCOPY N/A 12/07/2017   Procedure: ELECTROMAGNETIC NAVIGATION BRONCHOSCOPY;   Surgeon: Flora Lipps, MD;  Location: [facility] ORS;  Service: Cardiopulmonary;  Laterality: N/A;  . ESOPHAGOGASTRODUODENOSCOPY (EGD) WITH PROPOFOL N/A 07/08/2016   Procedure: ESOPHAGOGASTRODUODENOSCOPY (EGD) WITH PROPOFOL;  Surgeon: Manya Silvas, MD;  Location: [facility] ENDOSCOPY;  Service: Endoscopy;  Laterality: N/A;  . KNEE SURGERY Right    repair of acl tear  . PERIPHERAL VASCULAR CATHETERIZATION N/A 07/10/2016   Procedure: Lower Extremity Angiography;  Surgeon: Katha Cabal, MD;  Location: [place] [facility];  Service: Cardiovascular;  Laterality: N/A;  . PERIPHERAL VASCULAR CATHETERIZATION N/A 07/10/2016   Procedure: Abdominal Aortogram w/Lower Extremity;  Surgeon: Katha Cabal, MD;  Location: [place] [facility];  Service: Cardiovascular;  Laterality: N/A;  . PERIPHERAL VASCULAR CATHETERIZATION  07/10/2016   Procedure: Lower Extremity Intervention;  Surgeon: Katha Cabal, MD;  Location: [place] [facility];  Service: Cardiovascular;;    Social History   Socioeconomic History  . Marital status: Widowed    Spouse name: Not on file  . Number of children: Not on file  . Years of education: Not on file  . Highest education level: Not on file  Occupational History  . Not on file  Tobacco Use  . Smoking status: Former Smoker    Packs/day: 1.25    Years: 47.00    Pack years: 58.75    Types: Cigarettes    Quit date: 07/11/2016    Years since quitting: 4.3  . Smokeless tobacco: Never Used  Vaping Use  . Vaping  Use: Never used  Substance and Sexual Activity  . Alcohol use: No  . Drug use: No  . Sexual activity: Not Currently    Birth control/protection: None  Other Topics Concern  . Not on file  Social History Narrative  . Not on file   Social Determinants of Health   Financial Resource Strain: Not on file  Food Insecurity: Not on file  Transportation Needs: Not on file  Physical Activity: Not on file  Stress: Not on file  Social Connections: Not on file   Intimate Partner Violence: Not on file    Family History  Problem Relation Age of Onset  . Diabetes Mother   . Hypertension Mother   . Diabetes Maternal Grandfather   . Hypertension Maternal Grandfather   . Breast cancer Neg Hx    Allergies  Allergen Reactions  . Ciprofloxacin Swelling    Facial swelling following single oral dose.   . Codeine Anaphylaxis  . Nsaids Other (See Comments)    PT HAS HEPC PT HAS HEPC Patient has Hep C.    I? Current Outpatient Medications  Medication Sig Dispense Refill  . acetaminophen (TYLENOL) 650 MG CR tablet Take 650 mg by mouth every 8 (eight) hours as needed for pain.    [name] Kitchen albuterol (PROVENTIL HFA;VENTOLIN HFA) 108 (90 Base) MCG/ACT inhaler Inhale 2 puffs into the lungs every 6 (six) hours as needed for wheezing or shortness of breath.     . allopurinol (ZYLOPRIM) 100 MG tablet TAKE 1 TABLET BY MOUTH EVERY DAY 30 tablet 5  . clopidogrel (PLAVIX) 75 MG tablet Take 1 tablet (75 mg total) by mouth daily. 30 tablet 5  . cyanocobalamin 500 MCG tablet Take 500 mcg by mouth daily.    . diclofenac Sodium (VOLTAREN) 1 % GEL Apply 2 g topically 4 (four) times daily. 100 g 2  . entecavir (BARACLUDE) 0.5 MG tablet Take 0.5 mg by mouth every other day.    . folic acid (FOLVITE) 1 MG tablet TAKE 1 TABLET (1 MG TOTAL) BY MOUTH DAILY. 90 tablet 1  . gabapentin (NEURONTIN) 100 MG capsule Take 1 capsule (100 mg total) by mouth 2 (two) times daily. 60 capsule 0  . hydroxychloroquine (PLAQUENIL) 200 MG tablet Take 200 mg by mouth 2 (two) times daily.    [name] Kitchen levothyroxine (SYNTHROID, LEVOTHROID) 75 MCG tablet Take 75 mcg by mouth daily before breakfast.     . omeprazole (PRILOSEC) 20 MG capsule Take 1 capsule (20 mg total) by mouth daily. 60 capsule 0  . pravastatin (PRAVACHOL) 40 MG tablet Take 40 mg by mouth daily.    . valACYclovir (VALTREX) 500 MG tablet Take 1 tablet (500 mg total) by mouth daily. 30 tablet 0  . pravastatin (PRAVACHOL) 40 MG tablet Take 40 mg  by mouth every evening. (Patient not taking: Reported on 11/28/2020)    . predniSONE (DELTASONE) 2.5 MG tablet Take 1 tablet (2.5 mg total) by mouth every other day. (Patient not taking: Reported on 11/28/2020)     No current facility-administered medications for this visit.     Abtx:  Anti-infectives (From admission, onward)   None      REVIEW OF SYSTEMS:  Const: negative fever, negative chills, negative weight loss Eyes: negative diplopia or visual changes, negative eye pain ENT: negative coryza, negative sore throat Resp: negative cough, hemoptysis, dyspnea Cards: negative for chest pain, palpitations, lower extremity edema GU: negative for frequency, dysuria and hematuria GI: Negative for abdominal pain, diarrhea, bleeding, constipation Skin:  As above Heme: negative for easy bruising and gum/nose bleeding MS: negative for myalgias, arthralgias, back pain and muscle weakness Neurolo:negative for headaches, dizziness, vertigo, memory problems  Psych: negative for feelings of anxiety, depression  Allergy/Immunology- as above ? : Objective:  VITALS:  BP 125/77   Pulse 92   Temp (!) 97.2 F (36.2 C) (Oral)   Wt 179 lb (81.2 kg)   BMI 31.71 kg/m  PHYSICAL EXAM:  General: Alert, cooperative, no distress, appears stated age.  Head: Normocephalic, without obvious abnormality, atraumatic. Eyes: Conjunctivae clear, anicteric sclerae. Pupils are equal ENT Nares normal. No drainage or sinus tenderness. Lips, mucosa, and tongue normal. No Thrush Neck: Supple, symmetrical, no adenopathy, thyroid: non tender no carotid bruit and no JVD. Back: No CVA tenderness. Lungs: Clear to auscultation bilaterally. No Wheezing or Rhonchi. No rales. Heart: Regular rate and rhythm, no murmur, rub or gallop. Abdomen: Soft, non-tender,not distended. Bowel sounds normal. No masses Extremities:left leg lesions are drying up and scabbing       Lymph: Cervical, supraclavicular  normal. Neurologic: Grossly non-focal Pertinent Labs Lab Results CBC    Component Value Date/Time   WBC 4.1 11/27/2020 0812   RBC 3.89 11/27/2020 0812   HGB 11.7 (L) 11/27/2020 0812   HGB 9.0 (L) 02/18/2018 1204   HCT 36.1 11/27/2020 0812   HCT 46.5 06/11/2014 1732   PLT 139 (L) 11/27/2020 0812   PLT 127 (L) 06/11/2014 1732   MCV 92.8 11/27/2020 0812   MCV 92 06/11/2014 1732   MCH 30.1 11/27/2020 0812   MCHC 32.4 11/27/2020 0812   RDW 14.8 11/27/2020 0812   RDW 14.5 06/11/2014 1732   LYMPHSABS 1.0 11/27/2020 0812   MONOABS 0.5 11/27/2020 0812   EOSABS 0.1 11/27/2020 0812   BASOSABS 0.0 11/27/2020 0812    CMP Latest Ref Rng & Units 11/27/2020 11/20/2020 11/19/2020  Glucose 70 - 99 mg/dL 86 86 92  BUN 8 - 23 mg/dL 24(H) 16 17  Creatinine 0.44 - 1.00 mg/dL 0.99 1.05(H) 0.99  Sodium 135 - 145 mmol/L 140 139 139  Potassium 3.5 - 5.1 mmol/L 4.3 4.0 4.3  Chloride 98 - 111 mmol/L 106 108 110  CO2 22 - 32 mmol/L 29 26 25   Calcium 8.9 - 10.3 mg/dL 9.4 9.0 9.0  Total Protein 6.5 - 8.1 g/dL 7.0 - -  Total Bilirubin 0.3 - 1.2 mg/dL 0.5 - -  Alkaline Phos 38 - 126 U/L 63 - -  AST 15 - 41 U/L 18 - -  ALT 0 - 44 U/L 13 - -   ? Impression/Recommendation ? ?Herpes Zoster left leg- completed valtrex- as earlier planned she will not need long term suppressive therapy Not severly immune compromised Has received only 2 doses of rituximab- last one in Nov 2021 She will need zoster vaccine shingrix- but will have to get the timing of the vaccine correct. Will decide soon  AKI on CKD- resolved  ?  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

## 2020-11-28 NOTE — Telephone Encounter (Signed)
Contacted patient at 8:43 am today. Confirmed with patient that she received Roxie's message to stop the prednisone.

## 2020-11-28 NOTE — Patient Instructions (Addendum)
You are here for follow up of shingles for which you finished your course of valtrex. As you currently are not getting rituximab and this is only tour first episode we will not put you on suppressive therapy. We will discuss when to time your vaccine for shingles-labs done yesterday were good.

## 2020-11-29 NOTE — Telephone Encounter (Signed)
FMLA papers and return to work note faxed. Copy left in [place]- reception. Patient wanted personal copy of the FMLA papers. Please give to patient at her next apt.

## 2020-12-03 NOTE — Progress Notes (Signed)
[facility]  [address] [place], [place] [address] Phone: [phone]  Fax: [phone]   Clinic Day:  12/04/2020  Referring physician: Frazier Richards, MD  Chief Complaint: Tina Page is a 65 y.o. 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  HPI: The patient was last seen in the hematology clinic on 11/22/2020 via telemedicine.  At that time, he varicella rash was resolving.  She denied any fever.  She was on prednisone 2.5 mg QOD.  She  Was on entecavir QOD.  She was on valacyclovir 1 gm TID through 11/27/2020.  She was to begin prophylactic valacyclovir s/p shingles.  Labs on 11/27/2020 revealed a hematocrit of 36.1, hemoglobin 11.7, platelets 139,000, WBC 4100 with an ANC of 2400.  Creatinine was 0.99.  Cortisol was 11.3.  Prednisone was discontinued.  She saw Dr. Steva Ready on 11/28/2020 for follow-up of shingles.  She had completed 10 days of Valtrex.  She was doing better with some minor tingling and pain in her left leg, between her toes.  During the interim, she has been "ok." Her shingles infection is healing well. She has a few spots that are still purple; lesions have crusted over.   She also has had watery diarrhea for 1 week. It is not getting better or worse. She has a history of C diff. She denies fever, shortness of breath, chest pain, nausea, and vomiting.   The patient describes a "knot" at one of the sites where she got Lovenox shots.  The patient is off prednisone. She is taking entecavir every other day. She stopped valacyclovir on 11/28/2020.   Past Medical History:  Diagnosis Date  . Anemia   . Atherosclerosis of native arteries of extremity with intermittent claudication ([place]) 07/20/2016  . COPD (chronic obstructive pulmonary disease) ([place])   . 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 Elevated liver function tests 11/03/2017  .  GERD (gastroesophageal reflux disease)   . Heme positive stool 01/29/2015  . Hepatitis C 07/12/2016  . HLD (hyperlipidemia)   . Hypertension   . Hypothyroidism   . 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 Thrombocytopenia ([place]) 10/04/2016    Past Surgical History:  Procedure Laterality Date  . ELECTROMAGNETIC NAVIGATION BROCHOSCOPY N/A 12/07/2017   Procedure: ELECTROMAGNETIC NAVIGATION BRONCHOSCOPY;  Surgeon: Flora Lipps, MD;  Location: [facility] ORS;  Service: Cardiopulmonary;  Laterality: N/A;  . ESOPHAGOGASTRODUODENOSCOPY (EGD) WITH PROPOFOL N/A 07/08/2016   Procedure: ESOPHAGOGASTRODUODENOSCOPY (EGD) WITH PROPOFOL;  Surgeon: Manya Silvas, MD;  Location: [facility] ENDOSCOPY;  Service: Endoscopy;  Laterality: N/A;  . KNEE SURGERY Right    repair of acl tear  . PERIPHERAL VASCULAR CATHETERIZATION N/A 07/10/2016   Procedure: Lower Extremity Angiography;  Surgeon: Katha Cabal, MD;  Location: [place] CV LAB;  Service: Cardiovascular;  Laterality: N/A;  . PERIPHERAL VASCULAR CATHETERIZATION N/A 07/10/2016   Procedure: Abdominal Aortogram w/Lower Extremity;  Surgeon: Katha Cabal, MD;  Location: [place] CV LAB;  Service: Cardiovascular;  Laterality: N/A;  . PERIPHERAL VASCULAR CATHETERIZATION  07/10/2016   Procedure: Lower Extremity Intervention;  Surgeon: Katha Cabal, MD;  Location: [place] CV LAB;  Service: Cardiovascular;;    Family History  Problem Relation Age of Onset  . Diabetes Mother   . Hypertension Mother   . Diabetes Maternal Grandfather   . Hypertension Maternal Grandfather   . Breast cancer Neg  Hx     Social History:  reports that she quit smoking about 4 years ago. Her smoking use included cigarettes. She has a 58.75 pack-year smoking history. She has never used smokeless tobacco. She reports that she does not drink alcohol and does not use drugs. She has a 21 pack year smoking history (1/2 pack/day from age [age]).  The patient works at [employer] She is exposed to cold temperatures. She works 3rd shift. She has a daughter, [name] and a daughter named [name]. She lives in [place]. The patient is alone today.  Allergies:  Allergies  Allergen Reactions  . Ciprofloxacin Swelling    Facial swelling following single oral dose.   . Codeine Anaphylaxis  . Nsaids Other (See Comments)    PT HAS HEPC PT HAS HEPC Patient has Hep C.     Current Medications: Current Outpatient Medications  Medication Sig Dispense Refill  . acetaminophen (TYLENOL) 650 MG CR tablet Take 650 mg by mouth every 8 (eight) hours as needed for pain.    [name] Kitchen albuterol (PROVENTIL HFA;VENTOLIN HFA) 108 (90 Base) MCG/ACT inhaler Inhale 2 puffs into the lungs every 6 (six) hours as needed for wheezing or shortness of breath.     . allopurinol (ZYLOPRIM) 100 MG tablet TAKE 1 TABLET BY MOUTH EVERY DAY 30 tablet 5  . clopidogrel (PLAVIX) 75 MG tablet Take 1 tablet (75 mg total) by mouth daily. 30 tablet 5  . cyanocobalamin 500 MCG tablet Take 500 mcg by mouth daily.    . diclofenac Sodium (VOLTAREN) 1 % GEL Apply 2 g topically 4 (four) times daily. 100 g 2  . entecavir (BARACLUDE) 0.5 MG tablet Take 0.5 mg by mouth every other day.    . folic acid (FOLVITE) 1 MG tablet TAKE 1 TABLET (1 MG TOTAL) BY MOUTH DAILY. 90 tablet 1  . gabapentin (NEURONTIN) 100 MG capsule Take 1 capsule (100 mg total) by mouth 2 (two) times daily. 60 capsule 0  . hydroxychloroquine (PLAQUENIL) 200 MG tablet Take 200 mg by mouth 2 (two) times daily.    [name] Kitchen levothyroxine (SYNTHROID, LEVOTHROID) 75 MCG tablet Take 75 mcg by mouth daily before breakfast.     . omeprazole (PRILOSEC) 20 MG capsule Take 1 capsule (20 mg total) by mouth daily. 60 capsule 0  . pravastatin (PRAVACHOL) 40 MG tablet Take 40 mg by mouth every evening.    . pravastatin (PRAVACHOL) 40 MG tablet Take 40 mg by mouth daily. (Patient not taking: Reported on 12/04/2020)    . predniSONE  (DELTASONE) 2.5 MG tablet Take 1 tablet (2.5 mg total) by mouth every other day. (Patient not taking: Reported on 12/04/2020)    . valACYclovir (VALTREX) 500 MG tablet Take 1 tablet (500 mg total) by mouth daily. (Patient not taking: Reported on 12/04/2020) 30 tablet 0   No current facility-administered medications for this visit.    Review of Systems  Constitutional: Negative for chills, diaphoresis, fever and malaise/fatigue.       Feels "ok."  HENT: Negative for congestion, ear discharge, ear pain, hearing loss, nosebleeds, sinus pain, sore throat and tinnitus.   Eyes: Negative.  Negative for blurred vision, double vision, photophobia and pain.  Respiratory: Negative.  Negative for cough, hemoptysis, sputum production and shortness of breath.   Cardiovascular: Negative.  Negative for chest pain, palpitations and leg swelling.  Gastrointestinal: Positive for diarrhea (watery, x 1 week). Negative for abdominal pain, blood in stool, constipation, heartburn, melena, nausea and vomiting.  Genitourinary: Negative.  Negative for dysuria, frequency, hematuria and urgency.  Musculoskeletal: Negative.  Negative for back pain, joint pain, myalgias and neck pain.  Skin: Positive for rash. Negative for itching.       Shingles rash has crusted over.  "Knot" on abdomen at one of the sites where she got Lovenox injections.  Neurological: Negative for dizziness, tingling, sensory change, weakness and headaches.  Endo/Heme/Allergies: Negative for environmental allergies. Does not bruise/bleed easily.  Psychiatric/Behavioral: Negative for depression and memory loss. The patient is not nervous/anxious and does not have insomnia.   All other systems reviewed and are negative.  Performance status (ECOG): 1  Vital Signs Blood pressure 125/62, pulse 87, temperature 98.2 F (36.8 C), resp. rate 18, weight 177 lb 14.6 oz (80.7 kg).  Physical Exam Vitals and nursing note reviewed.  Constitutional:      General:  She is not in acute distress.    Appearance: She is not diaphoretic.  HENT:     Head: Normocephalic and atraumatic.     Mouth/Throat:     Mouth: Mucous membranes are moist.     Pharynx: Oropharynx is clear.  Eyes:     General: No scleral icterus.    Extraocular Movements: Extraocular movements intact.     Conjunctiva/sclera: Conjunctivae normal.     Pupils: Pupils are equal, round, and reactive to light.  Cardiovascular:     Rate and Rhythm: Normal rate and regular rhythm.     Heart sounds: Normal heart sounds. No murmur heard.   Pulmonary:     Effort: Pulmonary effort is normal. No respiratory distress.     Breath sounds: Normal breath sounds. No wheezing or rales.  Chest:     Chest wall: No tenderness.  Breasts:     Right: No axillary adenopathy or supraclavicular adenopathy.     Left: No axillary adenopathy or supraclavicular adenopathy.    Abdominal:     General: Bowel sounds are normal. There is no distension.     Palpations: Abdomen is soft. There is no mass.     Tenderness: There is no abdominal tenderness. There is no guarding or rebound.  Musculoskeletal:        General: No swelling or tenderness. Normal range of motion.     Cervical back: Normal range of motion and neck supple.  Lymphadenopathy:     Head:     Right side of head: No preauricular, posterior auricular or occipital adenopathy.     Left side of head: No preauricular, posterior auricular or occipital adenopathy.     Cervical: No cervical adenopathy.     Upper Body:     Right upper body: No supraclavicular or axillary adenopathy.     Left upper body: No supraclavicular or axillary adenopathy.     Lower Body: No right inguinal adenopathy. No left inguinal adenopathy.  Skin:    General: Skin is warm and dry.     Findings: Rash (variccella lesions on left lower anterior leg has crusted over) present.     Comments: Hematoma on left lower abdomen s/p Lovenox injection.  Neurological:     Mental Status:  She is alert and oriented to person, place, and time.  Psychiatric:        Behavior: Behavior normal.        Thought Content: Thought content normal.        Judgment: Judgment normal.    Imaging studies: 07/07/2016:  Chest, abdomen, and pelvis CTangiogram revealed moderate diffuse atherosclerotic vascular disease of the abdominal aorta  with severe stenosis of the left common iliac artery with suspected short segment occlusion. There was no adenopathy. Spleen was normal.  04/15/2017:  Abdominal ultrasoundrevealed a normal spleen and sludge in the gallbladder. The liver was echogenic consistent fatty infiltration and/or hepatocellular disease. 11/10/2017:  Abdomen and pelvic CTrevealed a 2.2 cm spiculated density in right middle lobe concerning for malignancy.  11/16/2017:  Chest CTon 11/16/2017 revealed a 2.2 x 2.0 x 2.4 spiculated RIGHT middle lobe mass. There was tiny nonspecific upper lobe pulmonary nodules greater on RIGHT, largest 3 mm, of uncertain etiology. There was additional 8 x 8 mm opacity in the posterior sulcus of the RIGHT lower lobe. 11/22/2017:  PET scanrevealed a 2 cm hypermetabolic right middle lobe lung mass (SUV 3.4) consistent with primary lung neoplasm. There were no findings for mediastinal/hilar lymphadenopathy or metastatic disease. There were areas of hypermetabolism involving the left oblique abdominal muscles and the anorectal junction. 02/06/2018:  Chest CT revealed the spiculated 2.2 cm right middle lobe lesion was almost completely resolved with only a residual 4 mm irregular nodule identified at this location on a background of architectural distortion/evolving scar. There was a 5 mm right parahilar nodule stable since 11/16/2017(not present on 07/07/2016). There were tiny nodules in both upper lobes suggesting inhalation etiology. 06/27/2019:  Chest CT revealed the nodule of the right middle lobe had almost completely resolved, with residual linear  scarring. There was persistent paramedian consolidation of the medial right middle lobe and lingula, generally in keeping with atypical infection, particularly atypical mycobacterium. There had been interval increase in size of a small nodule of the right middle lobe, now measuring 4 mm (previously 2 mm).   There was new 6 mm ground-glass opacity of the lateral left upper lobe measuring.  Although nonspecific, these were likely infectious or inflammatory in nature.  Attention on follow-up was receommended. There was unchanged tiny biapical pulmonary nodules, benign sequelae of nonspecific infection or inflammation, possibly related cigarette smoking, atypical infection, or other inhalational, inflammatory lung disease. There was hepatic steatosis. 12/26/2019:  Chest CT on 12/26/2019 revealed stable sub-cm bilateral pulmonary nodules, consistent with benign etiology. There was stable paramedian consolidation in the right middle lobe and lingula. There was a new airspace disease in inferior right middle lobe, with a few adjacent subcentimeter nodules. Overall, above findings were consistent with waxing and waning atypical infectious process, such as MAI.   07/24/2020:  Low dose chest CT revealed Lung-RADS 2, benign appearance or behavior. There was emphysema, aortic atherosclerosis, and coronary artery calcifications.   Appointment on 12/04/2020  Component Date Value Ref Range Status  . Sodium 12/04/2020 138  135 - 145 mmol/L Final  . Potassium 12/04/2020 3.9  3.5 - 5.1 mmol/L Final  . Chloride 12/04/2020 102  98 - 111 mmol/L Final  . CO2 12/04/2020 32  22 - 32 mmol/L Final  . Glucose, Bld 12/04/2020 94  70 - 99 mg/dL Final   Glucose reference range applies only to samples taken after fasting for at least 8 hours.  . BUN 12/04/2020 18  8 - 23 mg/dL Final  . Creatinine, Ser 12/04/2020 0.95  0.44 - 1.00 mg/dL Final  . Calcium 12/04/2020 9.1  8.9 - 10.3 mg/dL Final  . Total Protein 12/04/2020 7.0  6.5 -  8.1 g/dL Final  . Albumin 12/04/2020 4.1  3.5 - 5.0 g/dL Final  . AST 12/04/2020 19  15 - 41 U/L Final  . ALT 12/04/2020 14  0 - 44 U/L Final  .  Alkaline Phosphatase 12/04/2020 65  38 - 126 U/L Final  . Total Bilirubin 12/04/2020 0.4  0.3 - 1.2 mg/dL Final  . GFR, Estimated 12/04/2020 >60  >60 mL/min Final   Comment: (NOTE) Calculated using the CKD-EPI Creatinine Equation (2021)   . Anion gap 12/04/2020 4* 5 - 15 Final   Performed at [facility], [address] [place], [place] [address]  . WBC 12/04/2020 5.2  4.0 - 10.5 K/uL Final  . RBC 12/04/2020 4.07  3.87 - 5.11 MIL/uL Final  . Hemoglobin 12/04/2020 12.4  12.0 - 15.0 g/dL Final  . HCT 12/04/2020 37.7  36.0 - 46.0 % Final  . MCV 12/04/2020 92.6  80.0 - 100.0 fL Final  . MCH 12/04/2020 30.5  26.0 - 34.0 pg Final  . MCHC 12/04/2020 32.9  30.0 - 36.0 g/dL Final  . RDW 12/04/2020 14.8  11.5 - 15.5 % Final  . Platelets 12/04/2020 149* 150 - 400 K/uL Final  . nRBC 12/04/2020 0.0  0.0 - 0.2 % Final  . Neutrophils Relative % 12/04/2020 62  % Final  . Neutro Abs 12/04/2020 3.2  1.7 - 7.7 K/uL Final  . Lymphocytes Relative 12/04/2020 24  % Final  . Lymphs Abs 12/04/2020 1.3  0.7 - 4.0 K/uL Final  . Monocytes Relative 12/04/2020 11  % Final  . Monocytes Absolute 12/04/2020 0.6  0.1 - 1.0 K/uL Final  . Eosinophils Relative 12/04/2020 2  % Final  . Eosinophils Absolute 12/04/2020 0.1  0.0 - 0.5 K/uL Final  . Basophils Relative 12/04/2020 1  % Final  . Basophils Absolute 12/04/2020 0.1  0.0 - 0.1 K/uL Final  . Immature Granulocytes 12/04/2020 0  % Final  . Abs Immature Granulocytes 12/04/2020 0.01  0.00 - 0.07 K/uL Final   Performed at [facility], [address], [place], [place] [address]    Assessment:  JENIFER STRUVE is a 65 y.o. 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 One week prior to presentation, she felt like she was coming down with something. She  denied any fever, runny nose, sore throat or cough. She had some diarrhea. She denied any new medications or herbal products.  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 Reticulocyte count was 11.3%. Ferritin was 562. Iron studies revealed a saturation of 20% and a TIBC of 214 (low). B12 was 231 (low normal). Folate was 42. Peripheral smear revealed rouleaux formation. Labs on 06/21/2019revealed normal flow cytometry, SPEP, and immunoglobulins.  Additional testingincluded the following+ studies: hepatitis C antibody,hepatitis B core antibody,CMV IgM, and EBV VCA (IgM and IgG). Hepatitis B by PCRwas negative. Hepatitis C RNAwas negative. Mycoplasma pneumonia IgM was negative. Reticulocyte count was 11.3% (high) indicating appropriate marrow response. C3 and C4 were normal. Cold agglutinin titer was negative x 2. Negative studies included: ANA, hepatitis B surface antigen, SPEP, and free light chain ratio. 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 MMA was initially 330 (normal). Repeat MMA was elevated on 08/01/2016 confirming B12 deficiency. Hepatitis B surface antibody was positive and hepatitis B surface antigen was negative on 08/06/2016.  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 There was no adenopathy. Spleen was normal. Abdominal ultrasoundon 04/15/2017 revealed a normal spleen and sludge in the gallbladder. The liver was echogenic consistent fatty  infiltration and/or hepatocellular disease.  She underwent PTCA and stent placementin right and left common iliac arteries and left external iliac artery on 07/10/2016.She is on Plavix.EGD on 03/19/2015 revealed gastritis in the body and antrum. Colonoscopyon  03/19/2015 revealed one hyperplastic polyp. EGDon 07/08/2016 was normal. No evidence of bleeding.  She has received6 units of warmed PRBCsto date (last on 08/01/2016).If Rituxan is needed, she requires entecavir 0.5 mg q day beginning 2 weeks prior to Rituxan. In addition, hepatitis B viral level and LFTs should be checked monthly.  She began steroids(1 mg/kg) on 08/01/2016. She stopped prednisoneon 03/12/2017. She is onfolic acid1 mg a day.   She hasB12 deficiency. B12 was 231 on 07/09/2016. She is on oral B12. B12 and folate were normal on 03/09/2017 and 10/12/2017.  She developed peri-oral and intranasalherpes simplex-1. She was treated with valacyclovir and doxycycline on 10/19/2016. She developed a transient elevated alkaline phosphataseon 10/19/2016 which subsequently normalized.  She was documented to have awarm autoantibodyon 10/12/2017. LDH and bilirubin were normal. Hematocrit improved. She began prednisone10 mg on 12/07/2017 (discontinued 03/2018).She received Rituxanweekly x 4 (last dose 06/16/2018).  Entecavir was discontinued on 07/06/2019.  She developedflu like symptomson 10/26/2017. Symptoms included cough, myalgias, and fever (tmax unknown). She was prescribed Mucinex, Tamiflu, and amoxicillin. She only took amoxicillin x 5 days. She developedincreased liver function testson 11/02/2017. CMV IgG was positive. EBV VCA IgG, NA IgG, early antigen antibody IgG were elevated. EBV VCA IgM was <36. Testing was c/w a convalescence/past infection or reactivated infection. LFTs normalized on 11/15/2017. Smooth muscle antibodywas 39 (high) on 11/10/2017 and 34 (high) on 11/30/2017.   Labs on 06/12/2020 revealed recurrent anemia.  Hematocrit was 27.6, hemoglobin 8.4, MCV 104.5, platelets 138,000, WBC 3,400 (ANC 2,300). Creatinine was 1.17 (CrCl 50 ml/min).  Normal labs included ferritin (434), iron studies, B12 (885), folate (> 100).  [place]  polyspecific [facility] test was positive (warm autoantibody).  Reticulocyte count was 7.1%.  LDH was 204 (98-192).  Peripheral smear revealed macrocytic anemia with mild thrombocytopenia. The morphology of RBCs, WBCs, and platelets were within normal limits. There was no evidence of circulating blasts or schistocytes.  She began prednisone 1 mg/kg (80 mg) on 06/16/2020.  Prednisone was decreased to 20 mg a day on 111/24/2021.  She received Rituxan 1000 mg on day 1 and 15 (07/15/2020 and 07/29/2020).  She began entecavir on 06/28/2020.  She is off prednisone.  She is on Septra for PCP prophylaxis.  Septra is currently on hold.   Chest CTon 11/16/2017 revealed a 2.2 x 2.0 x 2.4 spiculated RIGHT middle lobe mass. There was tiny nonspecific upper lobe pulmonary nodules greater on RIGHT, largest 3 mm, of uncertain etiology. There was additional 8 x 8 mm opacity in the posterior sulcus of the RIGHT lower lobe. PET scanon 11/22/2017 revealed a 2 cm hypermetabolic right middle lobe lung mass (SUV 3.4) consistent with primary lung neoplasm. There were no findings for mediastinal/hilar lymphadenopathy or metastatic disease. There were areas of hypermetabolism involving the left oblique abdominal muscles and the anorectal junction.  PFTs on 11/18/2017 revealed an FEV1 of 1.22 liters (51%). DLCO adj was 6.8 mL/mmHg/min (32%).  ENB done on 12/07/2017. Cytology was negative for malignancy. Pathologydemonstrated organizing pneumonia and chronic bronchitis. Pathologist commented that organizing pneumonia spanned about 2 mm in one fragment. Cases of focal organizing pneumonia with hypermetabolism on FDG-PET have been described, but changes of this type can also be adjacent to a neoplasm. Patient prescribed a daily dose of Prednisone 10 mg,  with re-imaging planned in 6-8 weeks.   Low dose chest CT on 07/24/2020 revealed Lung-RADS 2, benign appearance or behavior. There was emphysema, aortic atherosclerosis, and  coronary artery calcifications.   She has diarrhea. 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 She received azithromycin x 3 days. She received ciprofloxacin x 10 days without improvement. She has seen ID in [place]. She began Bactrim on 03/04/2018 (discontinued on 03/11/2018) secondary to increase in creatine.  She has renal insufficiency. Creatininehas been followed: 1.17 on 11/05/2017, 1.85 on 11/09/2017, 1.38 on 11/12/2017, 1.74 on 11/15/2017, 1.66 on 11/17/2017, and 1.48 on 11/23/2017. Urinalysis on 11/15/2017 revealed revealed no hemoglobin, bilirubin or active sediment.  Liver function tests increased on 02/15/2019.  Work-up on 02/16/2019 revealed hepatitis B E antibody positive.  Negative studies included: hepatitis A total antibody, hepatitis A IgM, hepatitis B surface antigen, hepatitis B E antigen.  Hepatitis B DNA was not detected.  Folate was 80.5 and vitamin B12 was 443.  She has received her vaccinations:  She has completed the PCV13, PPSV23, and HiB.  She received Menvio (quadrivalent meningoccal vaccine) and Bexero (univalent serogroup B meningoccal vaccine) on 07/06/2019.  She was [facility] to [facility] from 07/16/2020 - 07/18/2020 with a fever and UTI.  Blood and urine culture grew E coli.  She received ceftriaxone and was discharged on Septra DS 1 tablet BID.  Septra was switched to Keflex secondary to renal insufficiency.  She was diagnosed with C difficile + diarrhea on 08/12/2020.  She completed a course of oral vancomycin.  Stool was negative for C. difficile on 08/27/2020.  She was admitted to [facility] from 11/17/2020 - 11/20/2020 with left L5 varicella zoster.  Lesion was positive for VZV. She was treated with ceftazidime and a valacyclovir.  She was discharged on 9 days of Valtrex 1 gm 3 times daily followed by 500 mg daily indefinitely for suppression and doxycycline.  She notes that she tested positive for COVID-19 on 08/21/2019.  She  received the Moderna COVID-19 vaccine on 11/10/2019 and 12/08/2019.   Symptomatically, she feels "ok."  L5 varicella zoster lesions have crusted over.   She has had watery diarrhea for 1 week.   Plan: 1.   Labs today: CBC with diff, CMP. 2.Warm autoimmune hemolytic anemia Clinically, she is doing well.       She active warm autoimmune hemolytic anemia responding well to Rituxan and a steroid taper. Hematocrit 27.6.  Hemoglobin   8.4.  LDH 204 (98-192) on 06/12/2020.                   Work-up confirmed warm autoantibodies.       Hematocrit 30.4.  Hemoglobin   9.2.  LDH 164 (98-192) on 06/19/2020.                   She began prednisone 1 mg/kg (80 mg) on 06/16/2020.       Hematocrit 32.7.  Hemoglobin 10.7 on 11/20/2020.   LDH was 173 on 10/31/2020.  Hematocrit 37.7.  Hemoglobin 12.4 in 12/04/2020.       She is s/p day 1 and 15 Rituxan (last 07/29/2020).       She is off prednisone.       Continue to monitor closely. 3.Hepatitis B Patient was previously exposed to hepatitis B. She was on entecavir x 1 year when she previously received Rituxan (stopped 07/06/2019). Patient did not complete her meningococcal vaccines.       She began entecavir 0.5  mg daily on 06/28/2020.                   Dose adjusted based on renal function.                   CrCl 30-49 ml/min   50% dosing (0.5 mg QOD).                   CrCl >= 50 ml/min 100% dosing (0.5 mg a day).  Creatinine fluctuates and is 0.95 today.  She is currently taking entecavir QOD.  Patient to be contacted when creatinine drawn for any adjustment in entecavir. 4.B12 deficiency B12 was 443 on 02/15/2019 and 885 on 06/12/2020.             Folate was 80.5 on 02/15/2019 and >100 on 06/12/2020.                         Folate was 30 on 11/17/2020.             She is currently on folic acid 1 mg a week.  Continue to monitor. 5. Right middle lobe nodule             Chest CT on 12/26/2019  revealed stable sub-cm bilateral pulmonary nodules c/w benign etiology.                          There was a new airspace disease in inferior right middle lobe, with a few adjacent subcentimeter nodules.                          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             Low-dose chest CT on 07/24/2020 revealedLung-RADS 2, benign appearance or behavior.             She is followed by Dr. Juliann Mule in pulmonary medicine. 6.   Left L5 varicella voster             Lesions have crusted over.  Valacyclovir was discontinued on 11/27/2020 by Dr. Delaine Lame.  Per Dr Gwenevere Ghazi note on 11/28/2020, she will not need prophylactic valacyclovir. 7.   PCP prophylaxis             Patient was on Septra DS on Mondays, Wednesdays, and Fridays.  Patient's Septra currently on hold secondary to renal insufficiency documented during hospitalization.  Reinstitute Septra on Mondays, Wednesdays and Fridays.  Discussed plan to to dapsone if any concerns about insufficiency due to Septra. 8.   Thrombocytopenia and leukopenia, resolved  WBC 5200.  Platelets 149,000.    Etiology likely secondary to current infection and underlying autoimmune disease.  If platelet count requires treatment, consider IVIG.  Continue to monitor closely. 9.   Diarrhea  Patient has a history of C. Difficile.  Stool for C. difficile and GI by PCR. 10.   RTC in 1 week for labs (BMP). 11.   RTC in 2 weeks for labs (CBC with diff, BMP). 12.   RTC in 1 month for MD assessment and labs (CBC with diff, CMP).  I discussed the assessment and treatment plan with the patient.  The patient was provided an opportunity to ask questions and all were answered.  The patient agreed with the plan and demonstrated an understanding of the instructions.  The patient  was advised to call back or seek an in person evaluation if the symptoms worsen or if the condition fails to improve as anticipated.   Nolon Stalls, MD, PhD  12/04/2020, 3:42 PM

## 2020-12-04 ENCOUNTER — Other Ambulatory Visit (INDEPENDENT_AMBULATORY_CARE_PROVIDER_SITE_OTHER): Payer: Self-pay | Admitting: Vascular Surgery

## 2020-12-04 ENCOUNTER — Other Ambulatory Visit: Payer: Self-pay

## 2020-12-04 ENCOUNTER — Inpatient Hospital Stay (HOSPITAL_BASED_OUTPATIENT_CLINIC_OR_DEPARTMENT_OTHER): Payer: BC Managed Care – PPO | Admitting: Hematology and Oncology

## 2020-12-04 ENCOUNTER — Encounter: Payer: Self-pay | Admitting: Hematology and Oncology

## 2020-12-04 ENCOUNTER — Inpatient Hospital Stay: Payer: BC Managed Care – PPO | Attending: Hematology and Oncology

## 2020-12-04 VITALS — BP 125/62 | HR 87 | Temp 98.2°F | Resp 18 | Wt 177.9 lb

## 2020-12-04 DIAGNOSIS — R197 Diarrhea, unspecified: Secondary | ICD-10-CM

## 2020-12-04 DIAGNOSIS — D696 Thrombocytopenia, unspecified: Secondary | ICD-10-CM

## 2020-12-04 DIAGNOSIS — B1910 Unspecified viral hepatitis B without hepatic coma: Secondary | ICD-10-CM | POA: Diagnosis present

## 2020-12-04 DIAGNOSIS — N289 Disorder of kidney and ureter, unspecified: Secondary | ICD-10-CM | POA: Diagnosis not present

## 2020-12-04 DIAGNOSIS — Z8619 Personal history of other infectious and parasitic diseases: Secondary | ICD-10-CM | POA: Diagnosis not present

## 2020-12-04 DIAGNOSIS — R768 Other specified abnormal immunological findings in serum: Secondary | ICD-10-CM | POA: Diagnosis not present

## 2020-12-04 DIAGNOSIS — B029 Zoster without complications: Secondary | ICD-10-CM

## 2020-12-04 DIAGNOSIS — Z833 Family history of diabetes mellitus: Secondary | ICD-10-CM | POA: Diagnosis not present

## 2020-12-04 DIAGNOSIS — R918 Other nonspecific abnormal finding of lung field: Secondary | ICD-10-CM | POA: Diagnosis not present

## 2020-12-04 DIAGNOSIS — Z8249 Family history of ischemic heart disease and other diseases of the circulatory system: Secondary | ICD-10-CM | POA: Insufficient documentation

## 2020-12-04 DIAGNOSIS — E039 Hypothyroidism, unspecified: Secondary | ICD-10-CM | POA: Insufficient documentation

## 2020-12-04 DIAGNOSIS — I700 Atherosclerosis of aorta: Secondary | ICD-10-CM | POA: Insufficient documentation

## 2020-12-04 DIAGNOSIS — Z95828 Presence of other vascular implants and grafts: Secondary | ICD-10-CM

## 2020-12-04 DIAGNOSIS — I2510 Atherosclerotic heart disease of native coronary artery without angina pectoris: Secondary | ICD-10-CM | POA: Insufficient documentation

## 2020-12-04 DIAGNOSIS — Z79899 Other long term (current) drug therapy: Secondary | ICD-10-CM | POA: Insufficient documentation

## 2020-12-04 DIAGNOSIS — L930 Discoid lupus erythematosus: Secondary | ICD-10-CM | POA: Insufficient documentation

## 2020-12-04 DIAGNOSIS — I70213 Atherosclerosis of native arteries of extremities with intermittent claudication, bilateral legs: Secondary | ICD-10-CM

## 2020-12-04 DIAGNOSIS — E538 Deficiency of other specified B group vitamins: Secondary | ICD-10-CM | POA: Diagnosis not present

## 2020-12-04 DIAGNOSIS — D591 Autoimmune hemolytic anemia, unspecified: Secondary | ICD-10-CM

## 2020-12-04 DIAGNOSIS — D5911 Warm autoimmune hemolytic anemia: Secondary | ICD-10-CM | POA: Insufficient documentation

## 2020-12-04 DIAGNOSIS — D72819 Decreased white blood cell count, unspecified: Secondary | ICD-10-CM

## 2020-12-04 DIAGNOSIS — J449 Chronic obstructive pulmonary disease, unspecified: Secondary | ICD-10-CM | POA: Diagnosis not present

## 2020-12-04 LAB — GASTROINTESTINAL PANEL BY PCR, STOOL (REPLACES STOOL CULTURE)

## 2020-12-04 LAB — COMPREHENSIVE METABOLIC PANEL
ALT: 14 U/L (ref 0–44)
AST: 19 U/L (ref 15–41)
Albumin: 4.1 g/dL (ref 3.5–5.0)
Alkaline Phosphatase: 65 U/L (ref 38–126)
Anion gap: 4 — ABNORMAL LOW (ref 5–15)
BUN: 18 mg/dL (ref 8–23)
CO2: 32 mmol/L (ref 22–32)
Calcium: 9.1 mg/dL (ref 8.9–10.3)
Chloride: 102 mmol/L (ref 98–111)
Creatinine, Ser: 0.95 mg/dL (ref 0.44–1.00)
GFR, Estimated: >60 mL/min (ref >60)
Glucose, Bld: 94 mg/dL (ref 70–99)
Potassium: 3.9 mmol/L (ref 3.5–5.1)
Sodium: 138 mmol/L (ref 135–145)
Total Bilirubin: 0.4 mg/dL (ref 0.3–1.2)
Total Protein: 7 g/dL (ref 6.5–8.1)

## 2020-12-04 LAB — CBC WITH DIFFERENTIAL/PLATELET
Abs Immature Granulocytes: 0.01 10*3/uL (ref 0.00–0.07)
Basophils Absolute: 0.1 10*3/uL (ref 0.0–0.1)
Basophils Relative: 1 %
Eosinophils Absolute: 0.1 10*3/uL (ref 0.0–0.5)
Eosinophils Relative: 2 %
HCT: 37.7 % (ref 36.0–46.0)
Hemoglobin: 12.4 g/dL (ref 12.0–15.0)
Immature Granulocytes: 0 %
Lymphocytes Relative: 24 %
Lymphs Abs: 1.3 10*3/uL (ref 0.7–4.0)
MCH: 30.5 pg (ref 26.0–34.0)
MCHC: 32.9 g/dL (ref 30.0–36.0)
MCV: 92.6 fL (ref 80.0–100.0)
Monocytes Absolute: 0.6 10*3/uL (ref 0.1–1.0)
Monocytes Relative: 11 %
Neutro Abs: 3.2 10*3/uL (ref 1.7–7.7)
Neutrophils Relative %: 62 %
Platelets: 149 10*3/uL — ABNORMAL LOW (ref 150–400)
RBC: 4.07 MIL/uL (ref 3.87–5.11)
RDW: 14.8 % (ref 11.5–15.5)
WBC: 5.2 10*3/uL (ref 4.0–10.5)
nRBC: 0 % (ref 0.0–0.2)

## 2020-12-04 LAB — C DIFFICILE QUICK SCREEN W PCR REFLEX
C Diff antigen: NEGATIVE
C Diff interpretation: NOT DETECTED
C Diff toxin: NEGATIVE

## 2020-12-04 NOTE — Progress Notes (Signed)
Patient here for follow up. Pt with complaints of diarrha on and off since last week. Pt had 2 episodes this morning.

## 2020-12-05 ENCOUNTER — Ambulatory Visit (INDEPENDENT_AMBULATORY_CARE_PROVIDER_SITE_OTHER): Payer: BC Managed Care – PPO | Admitting: Vascular Surgery

## 2020-12-05 ENCOUNTER — Ambulatory Visit (INDEPENDENT_AMBULATORY_CARE_PROVIDER_SITE_OTHER): Payer: BC Managed Care – PPO

## 2020-12-05 ENCOUNTER — Encounter (INDEPENDENT_AMBULATORY_CARE_PROVIDER_SITE_OTHER): Payer: Self-pay | Admitting: Vascular Surgery

## 2020-12-05 VITALS — BP 158/76 | HR 84 | Ht 63.0 in | Wt 177.0 lb

## 2020-12-05 DIAGNOSIS — J449 Chronic obstructive pulmonary disease, unspecified: Secondary | ICD-10-CM

## 2020-12-05 DIAGNOSIS — E782 Mixed hyperlipidemia: Secondary | ICD-10-CM

## 2020-12-05 DIAGNOSIS — I70213 Atherosclerosis of native arteries of extremities with intermittent claudication, bilateral legs: Secondary | ICD-10-CM | POA: Diagnosis not present

## 2020-12-05 DIAGNOSIS — I10 Essential (primary) hypertension: Secondary | ICD-10-CM | POA: Diagnosis not present

## 2020-12-05 NOTE — Progress Notes (Signed)
MRN : [id number]  Tina Page is a 65 y.o. (1955-12-21) female who presents with chief complaint of No chief complaint on file. [name] Kitchen  History of Present Illness:   The patient returns to the office for followup and review of the noninvasive studies.  She is s/p bilateral iliac artery stents using the "kissing balloon technique" on 07/10/2016.  The patient notes interval development ofleft groinpain.This is not related to activity, walking is stable.No new ulcers or wounds have occurred since the last visit.  There have been no significant changes to the patient's overall health care.  The patient denies amaurosis fugax or recent TIA symptoms. There are no recent neurological changes noted. The patient denies history of DVT, PE or superficial thrombophlebitis. The patient denies recent episodes of angina or shortness of breath.   ABI's Rt=1.01and Lt=0.97 (TBI is 0.98)(previous ABI's Rt=1.08and Lt=1.04)  No outpatient medications have been marked as taking for the 12/05/20 encounter (Appointment) with Delana Meyer, Dolores Lory, MD.    Past Medical History:  Diagnosis Date  . Anemia   . Atherosclerosis of native arteries of extremity with intermittent claudication ([place]) 07/20/2016  . COPD (chronic obstructive pulmonary disease) ([place])   . 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 Elevated liver function tests 11/03/2017  . GERD (gastroesophageal reflux disease)   . Heme positive stool 01/29/2015  . Hepatitis C 07/12/2016  . HLD (hyperlipidemia)   . Hypertension   . Hypothyroidism   . 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 Thrombocytopenia ([place]) 10/04/2016    Past Surgical History:  Procedure Laterality Date  . ELECTROMAGNETIC NAVIGATION BROCHOSCOPY N/A 12/07/2017   Procedure: ELECTROMAGNETIC NAVIGATION BRONCHOSCOPY;  Surgeon: Flora Lipps, MD;  Location: [facility] ORS;  Service: Cardiopulmonary;  Laterality: N/A;  . ESOPHAGOGASTRODUODENOSCOPY  (EGD) WITH PROPOFOL N/A 07/08/2016   Procedure: ESOPHAGOGASTRODUODENOSCOPY (EGD) WITH PROPOFOL;  Surgeon: Manya Silvas, MD;  Location[facility] END[facility]COPY;  Service: Endoscopy;  Laterality: N/A;  . KNEE SURGERY Right    repair of acl tear  . PERIPHERAL VASCULAR CATHETERIZATION N/A 07/10/2016   Procedure: Lower Extremity Angiography;  Surgeon: Katha Cabal, MD;  Location: [facility];  Service: Cardiovascular;  Laterality: N/A;  . PERIPHERAL VASCULAR CATHETERIZATION N/A 07/10/2016   Procedure: Abdominal Aortogram w/Lower Extremity;  Surgeon: Katha Cabal, MD;  Location: [place];  Service: Cardiovascular;  Laterality: N/A;  . PERIPHERAL VASCULAR CATHETERIZATION  07/10/2016   Procedure: Lower Extremity Intervention;  Surgeon: Katha Cabal, MD;  Location: [place] [facility];  Service: Cardiovascular;;    Social History Social History   Tobacco Use  . Smoking status: Former Smoker    Packs/day: 1.25    Years: 47.00    Pack years: 58.75    Types: Cigarettes    Quit date: 07/11/2016    Years since quitting: 4.4  . Smokeless tobacco: Never Used  Vaping Use  . Vaping Use: Never used  Substance Use Topics  . Alcohol use: No  . Drug use: No    Family History Family History  Problem Relation Age of Onset  . Diabetes Mother   . Hypertension Mother   . Diabetes Maternal Grandfather   . Hypertension Maternal Grandfather   . Breast cancer Neg Hx     Allergies  Allergen Reactions  . Ciprofloxacin Swelling    Facial swelling following single oral dose.   . Codeine Anaphylaxis  . Nsaids Other (See Comments)    PT HAS  HEPC PT HAS HEPC Patient has Hep C.      REVIEW OF SYSTEMS (Negative unless checked)  Constitutional: [] Weight loss  [] Fever  [] Chills Cardiac: [] Chest pain   [] Chest pressure   [] Palpitations   [] Shortness of breath when laying flat   [] Shortness of breath with exertion. Vascular:  [] Pain in legs with walking   [] Pain in legs  at rest  [] History of DVT   [] Phlebitis   [x] Swelling in legs   [] Varicose veins   [] Non-healing ulcers Pulmonary:   [] Uses home oxygen   [] Productive cough   [] Hemoptysis   [] Wheeze  [] COPD   [] Asthma Neurologic:  [] Dizziness   [] Seizures   [] History of stroke   [] History of TIA  [] Aphasia   [] Vissual changes   [] Weakness or numbness in arm   [] Weakness or numbness in leg Musculoskeletal:   [] Joint swelling   [] Joint pain   [] Low back pain Hematologic:  [] Easy bruising  [] Easy bleeding   [] Hypercoagulable state   [] Anemic Gastrointestinal:  [] Diarrhea   [] Vomiting  [] Gastroesophageal reflux/heartburn   [] Difficulty swallowing. Genitourinary:  [] Chronic kidney disease   [] Difficult urination  [] Frequent urination   [] Blood in urine Skin:  [] Rashes   [] Ulcers  Psychological:  [] History of anxiety   []  History of major depression.  Physical Examination  There were no vitals filed for this visit. There is no height or weight on file to calculate BMI. Gen: WD/WN, NAD Head: [place]/AT, No temporalis wasting.  Ear/Nose/Throat: Hearing grossly intact, nares w/o erythema or drainage Eyes: PER, EOMI, sclera nonicteric.  Neck: Supple, no large masses.   Pulmonary:  Good air movement, no audible wheezing bilaterally, no use of accessory muscles.  Cardiac: RRR, no JVD Vascular: No carotid bruits, mild edema Vessel Right Left  Radial Palpable Palpable  Carotid Palpable Palpable  PT Palpable Palpable  DP Palpable Palpable  Gastrointestinal: Non-distended. No guarding/no peritoneal signs.  Musculoskeletal: M/S 5/5 throughout.  No deformity or atrophy.  Neurologic: CN 2-12 intact. Symmetrical.  Speech is fluent. Motor exam as listed above. Psychiatric: Judgment intact, Mood & affect appropriate for pt's clinical situation. Dermatologic: Shingles left shin no rashes or ulcers noted right leg.  No changes consistent with cellulitis. Lymph : No lichenification or skin changes of chronic  lymphedema.  CBC Lab Results  Component Value Date   WBC 5.2 12/04/2020   HGB 12.4 12/04/2020   HCT 37.7 12/04/2020   MCV 92.6 12/04/2020   PLT 149 (L) 12/04/2020    BMET    Component Value Date/Time   NA 138 12/04/2020 1448   NA 142 06/11/2014 1732   K 3.9 12/04/2020 1448   K 3.1 (L) 06/11/2014 1732   CL 102 12/04/2020 1448   CL 103 06/11/2014 1732   CO2 32 12/04/2020 1448   CO2 32 06/11/2014 1732   GLUCOSE 94 12/04/2020 1448   GLUCOSE 100 (H) 06/11/2014 1732   BUN 18 12/04/2020 1448   BUN 15 06/11/2014 1732   CREATININE 0.95 12/04/2020 1448   CREATININE 0.73 06/11/2014 1732   CALCIUM 9.1 12/04/2020 1448   CALCIUM 8.9 06/11/2014 1732   GFRNONAA >60 12/04/2020 1448   GFRNONAA >60 06/11/2014 1732   GFRAA 58 (L) 12/28/2019 1325   GFRAA >60 06/11/2014 1732   Estimated Creatinine Clearance: 61.7 mL/min (by C-G formula based on SCr of 0.95 mg/dL).  COAG Lab Results  Component Value Date   INR 1.09 07/21/2016    Radiology No results found.    Assessment/Plan 1. Atherosclerosis of  native artery of both lower extremities with intermittent claudication (HCC)  - VAS [name] ABI WITH/WO TBI; Future   Recommend:  The patient has evidence of atherosclerosis of the lower extremities with claudication.  The patient does not voice lifestyle limiting changes at this point in time.  Noninvasive studies do not suggest clinically significant change.  No invasive studies, angiography or surgery at this time The patient should continue walking and begin a more formal exercise program.  The patient should continue antiplatelet therapy and aggressive treatment of the lipid abnormalities  No changes in the patient's medications at this time  The patient should continue wearing graduated compression socks 10-15 mmHg strength to control the mild edema.    2. Essential hypertension Continue antihypertensive medications as already ordered, these medications have been reviewed and  there are no changes at this time.   3. Chronic obstructive pulmonary disease, unspecified COPD type ([place]) Continue pulmonary medications and aerosols as already ordered, these medications have been reviewed and there are no changes at this time.    4. Mixed hyperlipidemia Continue statin as ordered and reviewed, no changes at this time     Hortencia Pilar, MD  12/05/2020 9:38 AM

## 2020-12-11 ENCOUNTER — Other Ambulatory Visit: Payer: Self-pay

## 2020-12-11 ENCOUNTER — Other Ambulatory Visit: Payer: BC Managed Care – PPO

## 2020-12-11 ENCOUNTER — Encounter: Payer: Self-pay | Admitting: Podiatry

## 2020-12-11 ENCOUNTER — Ambulatory Visit: Payer: BC Managed Care – PPO | Admitting: Podiatry

## 2020-12-11 ENCOUNTER — Inpatient Hospital Stay: Payer: BC Managed Care – PPO

## 2020-12-11 DIAGNOSIS — D5911 Warm autoimmune hemolytic anemia: Secondary | ICD-10-CM | POA: Diagnosis not present

## 2020-12-11 DIAGNOSIS — E538 Deficiency of other specified B group vitamins: Secondary | ICD-10-CM

## 2020-12-11 DIAGNOSIS — L600 Ingrowing nail: Secondary | ICD-10-CM | POA: Diagnosis not present

## 2020-12-11 DIAGNOSIS — R768 Other specified abnormal immunological findings in serum: Secondary | ICD-10-CM

## 2020-12-11 DIAGNOSIS — N289 Disorder of kidney and ureter, unspecified: Secondary | ICD-10-CM

## 2020-12-11 LAB — BASIC METABOLIC PANEL
Anion gap: 8 (ref 5–15)
BUN: 27 mg/dL — ABNORMAL HIGH (ref 8–23)
CO2: 26 mmol/L (ref 22–32)
Calcium: 8.9 mg/dL (ref 8.9–10.3)
Chloride: 107 mmol/L (ref 98–111)
Creatinine, Ser: 1.44 mg/dL — ABNORMAL HIGH (ref 0.44–1.00)
GFR, Estimated: 41 mL/min — ABNORMAL LOW (ref >60)
Glucose, Bld: 110 mg/dL — ABNORMAL HIGH (ref 70–99)
Potassium: 4.2 mmol/L (ref 3.5–5.1)
Sodium: 141 mmol/L (ref 135–145)

## 2020-12-11 NOTE — Patient Instructions (Signed)

## 2020-12-12 ENCOUNTER — Telehealth: Payer: Self-pay

## 2020-12-12 NOTE — Telephone Encounter (Signed)
Please call her and switch entecavir to every other day. Find out she is taking Septra MWF?  Pt also need follow up BMP.   -Tried calling patient in regards to message above. No answer. LVM to call back.

## 2020-12-13 NOTE — Telephone Encounter (Signed)
Pt states she is taking entecavir every other day and septra MWF. She has an appointment with you on 4/20 and with do a repeat BMP then.

## 2020-12-17 NOTE — Progress Notes (Signed)
  Subjective:  Patient ID: Tina Page, female    DOB: March 31, 1956,  MRN: [id number]  Chief Complaint  Patient presents with  . Ingrown Toenail    Patient presents today for ingrown toenail left hallux x 2 months    65 y.o. female presents with the above complaint. History confirmed with patient.  Has had issues with the right side previously Dr. Prudence Davidson treated successfully.  She currently has a shingles eruption in the lower extremity  Objective:  Physical Exam: warm, good capillary refill, no trophic changes or ulcerative lesions, normal DP and PT pulses and normal sensory exam. Left Foot: Ingrowing medial and lateral hallux nail border Assessment:   1. Ingrowing left great toenail      Plan:  Patient was evaluated and treated and all questions answered.    Ingrown Nail, left -Patient elects to proceed with minor surgery to remove ingrown toenail today. Consent reviewed and signed by patient. -Ingrown nail excised. See procedure note. -Educated on post-procedure care including soaking. Written instructions provided and reviewed. -Patient to follow up in 2 weeks for nail check.  Procedure: Excision of Ingrown Toenail Location: Left 1st toe medial and lateral nail borders. Anesthesia: Lidocaine 1% plain; 1.5 mL and Marcaine 0.5% plain; 1.5 mL, digital block. Skin Prep: Betadine. Dressing: Silvadene; telfa; dry, sterile, compression dressing. Technique: Following skin prep, the toe was exsanguinated and a tourniquet was secured at the base of the toe. The affected nail border was freed, split with a nail splitter, and excised. Chemical matrixectomy was then performed with phenol and irrigated out with alcohol. The tourniquet was then removed and sterile dressing applied. Disposition: Patient tolerated procedure well. Patient to return in 2 weeks for follow-up.    Return in about 2 weeks (around 12/25/2020) for nail re-check.

## 2020-12-18 ENCOUNTER — Other Ambulatory Visit: Payer: Self-pay

## 2020-12-18 ENCOUNTER — Inpatient Hospital Stay: Payer: BC Managed Care – PPO

## 2020-12-18 DIAGNOSIS — E538 Deficiency of other specified B group vitamins: Secondary | ICD-10-CM

## 2020-12-18 DIAGNOSIS — N289 Disorder of kidney and ureter, unspecified: Secondary | ICD-10-CM

## 2020-12-18 DIAGNOSIS — R768 Other specified abnormal immunological findings in serum: Secondary | ICD-10-CM

## 2020-12-18 DIAGNOSIS — D5911 Warm autoimmune hemolytic anemia: Secondary | ICD-10-CM | POA: Diagnosis not present

## 2020-12-18 LAB — CBC WITH DIFFERENTIAL/PLATELET
Abs Immature Granulocytes: 0.01 10*3/uL (ref 0.00–0.07)
Basophils Absolute: 0.1 10*3/uL (ref 0.0–0.1)
Basophils Relative: 1 %
Eosinophils Absolute: 0.2 10*3/uL (ref 0.0–0.5)
Eosinophils Relative: 3 %
HCT: 36.4 % (ref 36.0–46.0)
Hemoglobin: 11.6 g/dL — ABNORMAL LOW (ref 12.0–15.0)
Immature Granulocytes: 0 %
Lymphocytes Relative: 21 %
Lymphs Abs: 1.1 10*3/uL (ref 0.7–4.0)
MCH: 29.5 pg (ref 26.0–34.0)
MCHC: 31.9 g/dL (ref 30.0–36.0)
MCV: 92.6 fL (ref 80.0–100.0)
Monocytes Absolute: 0.6 10*3/uL (ref 0.1–1.0)
Monocytes Relative: 11 %
Neutro Abs: 3.3 10*3/uL (ref 1.7–7.7)
Neutrophils Relative %: 64 %
Platelets: 155 10*3/uL (ref 150–400)
RBC: 3.93 MIL/uL (ref 3.87–5.11)
RDW: 14.3 % (ref 11.5–15.5)
WBC: 5.2 10*3/uL (ref 4.0–10.5)
nRBC: 0 % (ref 0.0–0.2)

## 2020-12-18 LAB — BASIC METABOLIC PANEL
Anion gap: 6 (ref 5–15)
BUN: 16 mg/dL (ref 8–23)
CO2: 30 mmol/L (ref 22–32)
Calcium: 9.1 mg/dL (ref 8.9–10.3)
Chloride: 105 mmol/L (ref 98–111)
Creatinine, Ser: 1.17 mg/dL — ABNORMAL HIGH (ref 0.44–1.00)
GFR, Estimated: 53 mL/min — ABNORMAL LOW (ref >60)
Glucose, Bld: 87 mg/dL (ref 70–99)
Potassium: 3.9 mmol/L (ref 3.5–5.1)
Sodium: 141 mmol/L (ref 135–145)

## 2020-12-25 ENCOUNTER — Ambulatory Visit: Payer: BC Managed Care – PPO | Admitting: Podiatry

## 2020-12-25 ENCOUNTER — Other Ambulatory Visit: Payer: Self-pay

## 2020-12-25 ENCOUNTER — Encounter: Payer: Self-pay | Admitting: Podiatry

## 2020-12-25 DIAGNOSIS — L600 Ingrowing nail: Secondary | ICD-10-CM | POA: Diagnosis not present

## 2020-12-25 NOTE — Progress Notes (Signed)
  Subjective:  Patient ID: Tina Page, female    DOB: [birth year],  MRN: [id number]  Chief Complaint  Patient presents with  . Ingrown Toenail    "its doing ok, still tender to touch"    65 y.o. female returns with the above complaint. History confirmed with patient.  Still has a little drainage  Objective:  Physical Exam: warm, good capillary refill, no trophic changes or ulcerative lesions, normal DP and PT pulses and normal sensory exam. Left Foot: Healing well with scab Assessment:   1. Ingrowing left great toenail      Plan:  Patient was evaluated and treated and all questions answered.    Ingrown Nail, left -May leave open to air and discontinue soaks at this point should resolve in the next 3 to 4 weeks   No follow-ups on file.

## 2020-12-31 ENCOUNTER — Encounter
Admit: 2020-12-31 | Discharge: 2021-01-01 | Payer: PRIVATE HEALTH INSURANCE | Attending: Internal Medicine | Primary: Internal Medicine

## 2020-12-31 DIAGNOSIS — D591 AIHA (autoimmune hemolytic anemia) (CMS-HCC): Principal | ICD-10-CM

## 2020-12-31 DIAGNOSIS — Z79899 Other long term (current) drug therapy: Principal | ICD-10-CM

## 2020-12-31 DIAGNOSIS — Z8619 Personal history of other infectious and parasitic diseases: Principal | ICD-10-CM

## 2020-12-31 DIAGNOSIS — R768 Other specified abnormal immunological findings in serum: Principal | ICD-10-CM

## 2020-12-31 DIAGNOSIS — Z5181 Encounter for therapeutic drug level monitoring: Principal | ICD-10-CM

## 2020-12-31 MED ORDER — ENTECAVIR 0.5 MG TABLET
ORAL_TABLET | Freq: Every day | ORAL | 1 refills | 90 days | Status: CP
Start: 2020-12-31 — End: 2021-06-29

## 2021-01-01 DIAGNOSIS — Z8619 Personal history of other infectious and parasitic diseases: Principal | ICD-10-CM

## 2021-01-01 DIAGNOSIS — R768 Other specified abnormal immunological findings in serum: Principal | ICD-10-CM

## 2021-01-01 DIAGNOSIS — D591 AIHA (autoimmune hemolytic anemia) (CMS-HCC): Principal | ICD-10-CM

## 2021-01-01 MED ORDER — ENTECAVIR 0.5 MG TABLET
ORAL_TABLET | Freq: Every day | ORAL | 1 refills | 90 days
Start: 2021-01-01 — End: 2021-06-30

## 2021-01-02 MED ORDER — ENTECAVIR 0.5 MG TABLET
ORAL_TABLET | Freq: Every day | ORAL | 1 refills | 90.00000 days | Status: CP
Start: 2021-01-02 — End: 2021-07-01

## 2021-01-07 ENCOUNTER — Other Ambulatory Visit: Payer: Self-pay

## 2021-01-07 DIAGNOSIS — D5911 Warm autoimmune hemolytic anemia: Secondary | ICD-10-CM

## 2021-01-08 ENCOUNTER — Inpatient Hospital Stay: Payer: BC Managed Care – PPO

## 2021-01-08 ENCOUNTER — Inpatient Hospital Stay: Payer: BC Managed Care – PPO | Admitting: Oncology

## 2021-01-08 ENCOUNTER — Other Ambulatory Visit: Payer: Self-pay

## 2021-01-08 ENCOUNTER — Encounter: Payer: Self-pay | Admitting: Oncology

## 2021-01-08 ENCOUNTER — Inpatient Hospital Stay: Payer: BC Managed Care – PPO | Attending: Oncology

## 2021-01-08 VITALS — BP 165/78 | HR 78 | Temp 96.2°F | Resp 18 | Wt 178.0 lb

## 2021-01-08 DIAGNOSIS — I700 Atherosclerosis of aorta: Secondary | ICD-10-CM | POA: Insufficient documentation

## 2021-01-08 DIAGNOSIS — Z79899 Other long term (current) drug therapy: Secondary | ICD-10-CM | POA: Insufficient documentation

## 2021-01-08 DIAGNOSIS — B029 Zoster without complications: Secondary | ICD-10-CM | POA: Diagnosis not present

## 2021-01-08 DIAGNOSIS — Z8249 Family history of ischemic heart disease and other diseases of the circulatory system: Secondary | ICD-10-CM | POA: Diagnosis not present

## 2021-01-08 DIAGNOSIS — I2510 Atherosclerotic heart disease of native coronary artery without angina pectoris: Secondary | ICD-10-CM | POA: Insufficient documentation

## 2021-01-08 DIAGNOSIS — E538 Deficiency of other specified B group vitamins: Secondary | ICD-10-CM | POA: Diagnosis not present

## 2021-01-08 DIAGNOSIS — Z8619 Personal history of other infectious and parasitic diseases: Secondary | ICD-10-CM | POA: Diagnosis not present

## 2021-01-08 DIAGNOSIS — L930 Discoid lupus erythematosus: Secondary | ICD-10-CM | POA: Insufficient documentation

## 2021-01-08 DIAGNOSIS — N289 Disorder of kidney and ureter, unspecified: Secondary | ICD-10-CM | POA: Diagnosis not present

## 2021-01-08 DIAGNOSIS — D72819 Decreased white blood cell count, unspecified: Secondary | ICD-10-CM | POA: Diagnosis not present

## 2021-01-08 DIAGNOSIS — D696 Thrombocytopenia, unspecified: Secondary | ICD-10-CM | POA: Insufficient documentation

## 2021-01-08 DIAGNOSIS — J449 Chronic obstructive pulmonary disease, unspecified: Secondary | ICD-10-CM | POA: Diagnosis not present

## 2021-01-08 DIAGNOSIS — D5911 Warm autoimmune hemolytic anemia: Secondary | ICD-10-CM

## 2021-01-08 DIAGNOSIS — R918 Other nonspecific abnormal finding of lung field: Secondary | ICD-10-CM | POA: Diagnosis not present

## 2021-01-08 DIAGNOSIS — Z833 Family history of diabetes mellitus: Secondary | ICD-10-CM | POA: Diagnosis not present

## 2021-01-08 LAB — CBC WITH DIFFERENTIAL/PLATELET
Abs Immature Granulocytes: 0.01 10*3/uL (ref 0.00–0.07)
Basophils Absolute: 0.1 10*3/uL (ref 0.0–0.1)
Basophils Relative: 2 %
Eosinophils Absolute: 0.1 10*3/uL (ref 0.0–0.5)
Eosinophils Relative: 3 %
HCT: 37.6 % (ref 36.0–46.0)
Hemoglobin: 12.1 g/dL (ref 12.0–15.0)
Immature Granulocytes: 0 %
Lymphocytes Relative: 27 %
Lymphs Abs: 1.1 10*3/uL (ref 0.7–4.0)
MCH: 29.2 pg (ref 26.0–34.0)
MCHC: 32.2 g/dL (ref 30.0–36.0)
MCV: 90.8 fL (ref 80.0–100.0)
Monocytes Absolute: 0.4 10*3/uL (ref 0.1–1.0)
Monocytes Relative: 11 %
Neutro Abs: 2.4 10*3/uL (ref 1.7–7.7)
Neutrophils Relative %: 57 %
Platelets: 135 10*3/uL — ABNORMAL LOW (ref 150–400)
RBC: 4.14 MIL/uL (ref 3.87–5.11)
RDW: 14.6 % (ref 11.5–15.5)
WBC: 4.1 10*3/uL (ref 4.0–10.5)
nRBC: 0 % (ref 0.0–0.2)

## 2021-01-08 LAB — COMPREHENSIVE METABOLIC PANEL
ALT: 13 U/L (ref 0–44)
AST: 16 U/L (ref 15–41)
Albumin: 4.1 g/dL (ref 3.5–5.0)
Alkaline Phosphatase: 72 U/L (ref 38–126)
Anion gap: 5 (ref 5–15)
BUN: 18 mg/dL (ref 8–23)
CO2: 29 mmol/L (ref 22–32)
Calcium: 9.2 mg/dL (ref 8.9–10.3)
Chloride: 106 mmol/L (ref 98–111)
Creatinine, Ser: 0.86 mg/dL (ref 0.44–1.00)
GFR, Estimated: >60 mL/min (ref >60)
Glucose, Bld: 104 mg/dL — ABNORMAL HIGH (ref 70–99)
Potassium: 3.9 mmol/L (ref 3.5–5.1)
Sodium: 140 mmol/L (ref 135–145)
Total Bilirubin: 0.6 mg/dL (ref 0.3–1.2)
Total Protein: 7 g/dL (ref 6.5–8.1)

## 2021-01-08 NOTE — Progress Notes (Signed)
[facility]  [address], [address] [place], [place] [address] Phone: [phone]  Fax: [phone]   Clinic Day:  01/08/2021  Referring physician: Frazier Richards, MD  Chief Complaint: Tina Page is a 65 y.o. 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  HPI: The patient was last seen in the hematology clinic on 12/04/2020. At this time, she has been "ok." Her shingles infection is healing well. She has a few spots that are still purple; lesions have crusted over.   The patient is off prednisone. She is taking Entecavir every other day after her recent Shingles infection.   During the interim, she has seen ID at [facility]. She reports she taken off Bactrim (as this medication was only needed while she was on the Rituxan).   Diarrhea has resolved, but reports "loose" bowel movements, occasionally.  She denies fever, wt. loss, nausea, vomiting, change in appetite, weakness, malaise, constipation, numbness of hands/feet.   Past Medical History:  Diagnosis Date  . Anemia   . Atherosclerosis of native arteries of extremity with intermittent claudication ([place]) 07/20/2016  . COPD (chronic obstructive pulmonary disease) ([place])   . 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 Elevated liver function tests 11/03/2017  . GERD (gastroesophageal reflux disease)   . Heme positive stool 01/29/2015  . Hepatitis C 07/12/2016  . HLD (hyperlipidemia)   . Hypertension   . Hypothyroidism   . 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 Thrombocytopenia ([place]) 10/04/2016    Past Surgical History:  Procedure Laterality Date  . ELECTROMAGNETIC NAVIGATION BROCHOSCOPY N/A 12/07/2017   Procedure: ELECTROMAGNETIC NAVIGATION BRONCHOSCOPY;  Surgeon: Flora Lipps, MD;  Location: [facility] ORS;  Service: Cardiopulmonary;  Laterality: N/A;  . ESOPHAGOGASTRODUODENOSCOPY (EGD) WITH  PROPOFOL N/A 07/08/2016   Procedure: ESOPHAGOGASTRODUODENOSCOPY (EGD) WITH PROPOFOL;  Surgeon: Manya Silvas, MD;  Location: [facility] ENDOSCOPY;  Service: Endoscopy;  Laterality: N/A;  . KNEE SURGERY Right    repair of acl tear  . PERIPHERAL VASCULAR CATHETERIZATION N/A 07/10/2016   Procedure: Lower Extremity Angiography;  Surgeon: Katha Cabal, MD;  Location: [place] [facility];  Service: Cardiovascular;  Laterality: N/A;  . PERIPHERAL VASCULAR CATHETERIZATION N/A 07/10/2016   Procedure: Abdominal Aortogram w/Lower Extremity;  Surgeon: Katha Cabal, MD;  Location: [place] [facility];  Service: Cardiovascular;  Laterality: N/A;  . PERIPHERAL VASCULAR CATHETERIZATION  07/10/2016   Procedure: Lower Extremity Intervention;  Surgeon: Katha Cabal, MD;  Location: [place] [facility];  Service: Cardiovascular;;    Family History  Problem Relation Age of Onset  . Diabetes Mother   . Hypertension Mother   . Diabetes Maternal Grandfather   . Hypertension Maternal Grandfather   . Breast cancer Neg Hx     Social History:  reports that she quit smoking about 4 years ago. Her smoking use included cigarettes. She has a 58.75 pack-year smoking history. She has never used smokeless tobacco. She reports that she does not drink alcohol and does not use drugs. She has a 21 pack year smoking history (1/2 pack/day from age [age]). The patient works at [employer] She is exposed to cold temperatures. She works 3rd shift. She has a daughter, [name] and a daughter named [name]. She lives in [place]. The patient is alone today.  Allergies:  Allergies  Allergen Reactions  . Ciprofloxacin Swelling    Facial swelling following single  oral dose.   . Codeine Anaphylaxis  . Nsaids Other (See Comments)    PT HAS HEPC PT HAS HEPC Patient has Hep C.     Current Medications: Current Outpatient Medications  Medication Sig Dispense Refill  . acetaminophen (TYLENOL) 650 MG  CR tablet Take 650 mg by mouth every 8 (eight) hours as needed for pain.    [name] Kitchen albuterol (PROVENTIL HFA;VENTOLIN HFA) 108 (90 Base) MCG/ACT inhaler Inhale 2 puffs into the lungs every 6 (six) hours as needed for wheezing or shortness of breath.     . allopurinol (ZYLOPRIM) 100 MG tablet TAKE 1 TABLET BY MOUTH EVERY DAY 30 tablet 5  . clopidogrel (PLAVIX) 75 MG tablet Take 1 tablet (75 mg total) by mouth daily. 30 tablet 5  . cyanocobalamin 500 MCG tablet Take 500 mcg by mouth daily.    . diclofenac Sodium (VOLTAREN) 1 % GEL Apply 2 g topically 4 (four) times daily. 100 g 2  . entecavir (BARACLUDE) 0.5 MG tablet Take 0.5 mg by mouth every other day.    . folic acid (FOLVITE) 1 MG tablet TAKE 1 TABLET (1 MG TOTAL) BY MOUTH DAILY. 90 tablet 1  . hydroxychloroquine (PLAQUENIL) 200 MG tablet Take 200 mg by mouth 2 (two) times daily.    [name] Kitchen levothyroxine (SYNTHROID, LEVOTHROID) 75 MCG tablet Take 75 mcg by mouth daily before breakfast.     . omeprazole (PRILOSEC) 20 MG capsule Take 1 capsule (20 mg total) by mouth daily. 60 capsule 0  . pravastatin (PRAVACHOL) 40 MG tablet Take 40 mg by mouth daily.    [name] Kitchen gabapentin (NEURONTIN) 100 MG capsule Take 1 capsule (100 mg total) by mouth 2 (two) times daily. (Patient not taking: Reported on 01/08/2021) 60 capsule 0  . sulfamethoxazole-trimethoprim (BACTRIM DS) 800-160 MG tablet indefinite prophylactic dose (Patient not taking: Reported on 01/08/2021)     No current facility-administered medications for this visit.    Performance status (ECOG): 1  Review of Systems  Constitutional: Negative for chills and fever.  Eyes: Negative for blurred vision and double vision.  Cardiovascular: Negative for chest pain and palpitations.  Gastrointestinal: Negative for constipation, diarrhea, nausea and vomiting.  Genitourinary: Negative for dysuria and hematuria.  Skin: Negative.   Neurological: Negative for dizziness and headaches.   Vital Signs Blood pressure (!)  165/78, pulse 78, temperature (!) 96.2 F (35.7 C), resp. rate 18, weight 80.7 kg, SpO2 98 %.  Physical Exam Vitals and nursing note reviewed.  Constitutional:      General: She is not in acute distress.    Appearance: She is not diaphoretic.  HENT:     Head: Normocephalic and atraumatic.     Mouth/Throat:     Mouth: Mucous membranes are moist.     Pharynx: Oropharynx is clear.  Eyes:     Extraocular Movements: Extraocular movements intact.     Conjunctiva/sclera: Conjunctivae normal.     Pupils: Pupils are equal, round, and reactive to light.  Cardiovascular:     Rate and Rhythm: Normal rate and regular rhythm.     Heart sounds: Normal heart sounds. No murmur heard.   Pulmonary:     Effort: Pulmonary effort is normal. No respiratory distress.     Breath sounds: Normal breath sounds. No wheezing or rales.  Chest:     Chest wall: No tenderness.  Breasts:     Right: No axillary adenopathy.     Left: No axillary adenopathy.    Abdominal:  General: Bowel sounds are normal. There is no distension.     Palpations: Abdomen is soft. There is no mass.     Tenderness: There is no abdominal tenderness. There is no guarding or rebound.  Musculoskeletal:        General: No swelling or tenderness. Normal range of motion.     Cervical back: Normal range of motion and neck supple.  Lymphadenopathy:     Upper Body:     Right upper body: No axillary adenopathy.     Left upper body: No axillary adenopathy.  Skin:    General: Skin is warm and dry.     Findings: No rash.     Comments: Healed "shingles rash" area over left foot.   Neurological:     Mental Status: She is alert and oriented to person, place, and time.  Psychiatric:        Behavior: Behavior normal.        Thought Content: Thought content normal.        Judgment: Judgment normal.    Imaging studies: 07/07/2016:  Chest, abdomen, and pelvis CTangiogram revealed moderate diffuse atherosclerotic vascular disease of the  abdominal aorta with severe stenosis of the left common iliac artery with suspected short segment occlusion. There was no adenopathy. Spleen was normal.  04/15/2017:  Abdominal ultrasoundrevealed a normal spleen and sludge in the gallbladder. The liver was echogenic consistent fatty infiltration and/or hepatocellular disease. 11/10/2017:  Abdomen and pelvic CTrevealed a 2.2 cm spiculated density in right middle lobe concerning for malignancy.  11/16/2017:  Chest CTon 11/16/2017 revealed a 2.2 x 2.0 x 2.4 spiculated RIGHT middle lobe mass. There was tiny nonspecific upper lobe pulmonary nodules greater on RIGHT, largest 3 mm, of uncertain etiology. There was additional 8 x 8 mm opacity in the posterior sulcus of the RIGHT lower lobe. 11/22/2017:  PET scanrevealed a 2 cm hypermetabolic right middle lobe lung mass (SUV 3.4) consistent with primary lung neoplasm. There were no findings for mediastinal/hilar lymphadenopathy or metastatic disease. There were areas of hypermetabolism involving the left oblique abdominal muscles and the anorectal junction. 02/06/2018:  Chest CT revealed the spiculated 2.2 cm right middle lobe lesion was almost completely resolved with only a residual 4 mm irregular nodule identified at this location on a background of architectural distortion/evolving scar. There was a 5 mm right parahilar nodule stable since 11/16/2017(not present on 07/07/2016). There were tiny nodules in both upper lobes suggesting inhalation etiology. 06/27/2019:  Chest CT revealed the nodule of the right middle lobe had almost completely resolved, with residual linear scarring. There was persistent paramedian consolidation of the medial right middle lobe and lingula, generally in keeping with atypical infection, particularly atypical mycobacterium. There had been interval increase in size of a small nodule of the right middle lobe, now measuring 4 mm (previously 2 mm).   There was new 6 mm  ground-glass opacity of the lateral left upper lobe measuring.  Although nonspecific, these were likely infectious or inflammatory in nature.  Attention on follow-up was receommended. There was unchanged tiny biapical pulmonary nodules, benign sequelae of nonspecific infection or inflammation, possibly related cigarette smoking, atypical infection, or other inhalational, inflammatory lung disease. There was hepatic steatosis. 12/26/2019:  Chest CT on 12/26/2019 revealed stable sub-cm bilateral pulmonary nodules, consistent with benign etiology. There was stable paramedian consolidation in the right middle lobe and lingula. There was a new airspace disease in inferior right middle lobe, with a few adjacent subcentimeter nodules. Overall, above findings were  consistent with waxing and waning atypical infectious process, such as MAI.   07/24/2020:  Low dose chest CT revealed Lung-RADS 2, benign appearance or behavior. There was emphysema, aortic atherosclerosis, and coronary artery calcifications.   Appointment on 01/08/2021  Component Date Value Ref Range Status  . WBC 01/08/2021 4.1  4.0 - 10.5 K/uL Final  . RBC 01/08/2021 4.14  3.87 - 5.11 MIL/uL Final  . Hemoglobin 01/08/2021 12.1  12.0 - 15.0 g/dL Final  . HCT 01/08/2021 37.6  36.0 - 46.0 % Final  . MCV 01/08/2021 90.8  80.0 - 100.0 fL Final  . MCH 01/08/2021 29.2  26.0 - 34.0 pg Final  . MCHC 01/08/2021 32.2  30.0 - 36.0 g/dL Final  . RDW 01/08/2021 14.6  11.5 - 15.5 % Final  . Platelets 01/08/2021 135* 150 - 400 K/uL Final  . nRBC 01/08/2021 0.0  0.0 - 0.2 % Final  . Neutrophils Relative % 01/08/2021 57  % Final  . Neutro Abs 01/08/2021 2.4  1.7 - 7.7 K/uL Final  . Lymphocytes Relative 01/08/2021 27  % Final  . Lymphs Abs 01/08/2021 1.1  0.7 - 4.0 K/uL Final  . Monocytes Relative 01/08/2021 11  % Final  . Monocytes Absolute 01/08/2021 0.4  0.1 - 1.0 K/uL Final  . Eosinophils Relative 01/08/2021 3  % Final  . Eosinophils Absolute  01/08/2021 0.1  0.0 - 0.5 K/uL Final  . Basophils Relative 01/08/2021 2  % Final  . Basophils Absolute 01/08/2021 0.1  0.0 - 0.1 K/uL Final  . Immature Granulocytes 01/08/2021 0  % Final  . Abs Immature Granulocytes 01/08/2021 0.01  0.00 - 0.07 K/uL Final   Performed at [facility], [address], [place], [place] [address]    Assessment:  CACHET MCCUTCHEN is a 65 y.o. 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 One week prior to presentation, she felt like she was coming down with something. She denied any fever, runny nose, sore throat or cough. She had some diarrhea. She denied any new medications or herbal products.  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   Additional testingincluded the following+ studies: hepatitis C antibody,hepatitis B core antibody,CMV IgM, and EBV VCA (IgM and IgG). Hepatitis B by PCRwas negative. Hepatitis C RNAwas negative. Mycoplasma pneumonia IgM was negative. Reticulocyte count was 11.3% (high) indicating appropriate marrow response. C3 and C4 were normal. Cold agglutinin titer was negative x 2. Negative studies included: ANA, hepatitis B surface antigen, SPEP, and free light chain ratio. 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 MMA was initially 330 (normal). Repeat MMA was elevated on 08/01/2016 confirming B12 deficiency. Hepatitis B surface antibody was positive and hepatitis B surface antigen was negative on 08/06/2016.  She was documented to have awarm autoantibodyon 10/12/2017. LDH and bilirubin were normal. Hematocrit improved. She began prednisone10 mg on 12/07/2017 (discontinued 03/2018).She received Rituxanweekly x 4 (last dose 06/16/2018).  Entecavir was discontinued on 07/06/2019.  She began prednisone 1 mg/kg (80 mg) on 06/16/2020.  Prednisone was decreased to 20 mg a day on 111/24/2021.  She  received Rituxan 1000 mg on day 1 and 15 (07/15/2020 and 07/29/2020).  She began entecavir on 06/28/2020.  She is off prednisone.  She is on Septra for PCP prophylaxis.  Septra is currently on hold.  ENB done on 12/07/2017. Cytology was negative for malignancy. Pathologydemonstrated organizing pneumonia and chronic bronchitis. Pathologist commented that organizing pneumonia spanned  about 2 mm in one fragment. Cases of focal organizing pneumonia with hypermetabolism on FDG-PET have been described, but changes of this type can also be adjacent to a neoplasm. Patient prescribed a daily dose of Prednisone 10 mg, with re-imaging planned in 6-8 weeks.   Low dose chest CT on 07/24/2020 revealed Lung-RADS 2, benign appearance or behavior. There was emphysema, aortic atherosclerosis, and coronary artery calcifications.  She notes that she tested positive for COVID-19 on 08/21/2019.  She received the Moderna COVID-19 vaccine on 11/10/2019 and 12/08/2019.    Plan: 1.   Labs today: CBC with diff, CMP. 2.Warm autoimmune hemolytic anemia Clinically, she is doing well.       She active warm autoimmune hemolytic anemia responding well to Rituxan and a steroid taper.  HCT 37.6   HGB 12.1 today (01/08/2021).        She is s/p day 1 and 15 Rituxan (last 07/29/2020).       She is off prednisone.       Continue to monitor closely. 3.Hepatitis B Patient was previously exposed to hepatitis B. She was on entecavir x 1 year when she previously received Rituxan (stopped 07/06/2019). Patient did not complete her meningococcal vaccines.       She began entecavir 0.5 mg daily on 06/28/2020.       Dose adjusted based on renal function.       CrCl 30-49 ml/min   50% dosing (0.5 mg QOD).       CrCl >= 50 ml/min 100% dosing (0.5 mg a day).  Creatinine fluctuates and is 0.86 today.  She is currently taking entecavir QOD.  Patient to be contacted when creatinine drawn for any adjustment in  entecavir. 4.B12 deficiency B12 was 443 on 02/15/2019 and 885 on 06/12/2020.             Folate was 80.5 on 02/15/2019 and >100 on 06/12/2020.             Folate was 30 on 11/17/2020.             She is currently on folic acid 1 mg a week.  Continue to monitor. 5. Right middle lobe nodule              Low-dose chest CT on 07/24/2020 revealedLung-RADS 2, benign appearance or behavior.             She is followed by Dr. Juliann Mule in pulmonary medicine. 6.   PCP prophylaxis  Patient saw ID specialist at [facility]. Patient was informed that she could stop the Septra at this time.  7.   Thrombocytopenia and leukopenia  WBC 4100.  Platelets 135,000 (01/08/2021).   Etiology likely secondary to current infection and underlying autoimmune disease.  If platelet count requires treatment, consider IVIG.  Continue to monitor closely. 8.   Diarrhea, resolved.  Disposition:  RTC in 1 month for labs (CBC with diff, CMP).  RTC in 2 months for MD assessment and labs (CBC with diff, CMP). Patient would like to see Dr. Tasia Catchings (stay at [facility]).    The patient's diagnosis, an outline of the further diagnostic and laboratory studies which will be required, the recommendation for surgery, and alternatives were discussed with her and her accompanying family members.  All questions were answered to their satisfaction.  I personally had a face to face interaction and evaluated the patient jointly with the NP Student, Mrs. Benedetto Goad.  I have reviewed her history and available records and  have performed the key portions of the physical exam including general, HEENT, abdominal exam, pelvic exam with my findings confirming those documented above by the APP student.  I have discussed the case with the APP student and the patient.  I agree with the above documentation, assessment and plan which was fully formulated by me.  Counseling was completed by me.    Benedetto Goad, Student  FNP  Greater than 50% was spent in counseling and coordination of care with this patient including but not limited to discussion of the relevant topics above (See A&P) including, but not limited to diagnosis and management of acute and chronic medical conditions.   Faythe Casa, NP 01/09/2021 3:09 PM

## 2021-01-08 NOTE — Progress Notes (Deleted)
[facility]  [address] [place], [place] [address] Phone: [phone]  Fax: [phone]   Clinic Day:  01/08/2021  Referring physician: Frazier Richards, MD  Chief Complaint: Tina Page is a 65 y.o. 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  HPI: The patient was last seen in the hematology clinic on 11/22/2020 via telemedicine.  At that time, he varicella rash was resolving.  She denied any fever.  She was on prednisone 2.5 mg QOD.  She  Was on entecavir QOD.  She was on valacyclovir 1 gm TID through 11/27/2020.  She was to begin prophylactic valacyclovir s/p shingles.  Labs on 11/27/2020 revealed a hematocrit of 36.1, hemoglobin 11.7, platelets 139,000, WBC 4100 with an ANC of 2400.  Creatinine was 0.99.  Cortisol was 11.3.  Prednisone was discontinued.  She saw Dr. Steva Ready on 11/28/2020 for follow-up of shingles.  She had completed 10 days of Valtrex.  She was doing better with some minor tingling and pain in her left leg, between her toes.  During the interim, she has been "ok." Her shingles infection is healing well. She has a few spots that are still purple; lesions have crusted over.   She also has had watery diarrhea for 1 week. It is not getting better or worse. She has a history of C diff. She denies fever, shortness of breath, chest pain, nausea, and vomiting.   The patient describes a "knot" at one of the sites where she got Lovenox shots.  The patient is off prednisone. She is taking entecavir every other day. She stopped valacyclovir on 11/28/2020.   Past Medical History:  Diagnosis Date  . Anemia   . Atherosclerosis of native arteries of extremity with intermittent claudication ([place]) 07/20/2016  . COPD (chronic obstructive pulmonary disease) ([place])   . 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 Elevated liver function tests 11/03/2017  .  GERD (gastroesophageal reflux disease)   . Heme positive stool 01/29/2015  . Hepatitis C 07/12/2016  . HLD (hyperlipidemia)   . Hypertension   . Hypothyroidism   . 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 Thrombocytopenia ([place]) 10/04/2016    Past Surgical History:  Procedure Laterality Date  . ELECTROMAGNETIC NAVIGATION BROCHOSCOPY N/A 12/07/2017   Procedure: ELECTROMAGNETIC NAVIGATION BRONCHOSCOPY;  Surgeon: Flora Lipps, MD;  Location: [facility] ORS;  Service: Cardiopulmonary;  Laterality: N/A;  . ESOPHAGOGASTRODUODENOSCOPY (EGD) WITH PROPOFOL N/A 07/08/2016   Procedure: ESOPHAGOGASTRODUODENOSCOPY (EGD) WITH PROPOFOL;  Surgeon: Manya Silvas, MD;  Location: [facility] ENDOSCOPY;  Service: Endoscopy;  Laterality: N/A;  . KNEE SURGERY Right    repair of acl tear  . PERIPHERAL VASCULAR CATHETERIZATION N/A 07/10/2016   Procedure: Lower Extremity Angiography;  Surgeon: Katha Cabal, MD;  Location: [place] [facility];  Service: Cardiovascular;  Laterality: N/A;  . PERIPHERAL VASCULAR CATHETERIZATION N/A 07/10/2016   Procedure: Abdominal Aortogram w/Lower Extremity;  Surgeon: Katha Cabal, MD;  Location: [place] [facility];  Service: Cardiovascular;  Laterality: N/A;  . PERIPHERAL VASCULAR CATHETERIZATION  07/10/2016   Procedure: Lower Extremity Intervention;  Surgeon: Katha Cabal, MD;  Location: [place] [facility];  Service: Cardiovascular;;    Family History  Problem Relation Age of Onset  . Diabetes Mother   . Hypertension Mother   . Diabetes Maternal Grandfather   . Hypertension Maternal Grandfather   . Breast cancer Neg  Hx     Social History:  reports that she quit smoking about 4 years ago. Her smoking use included cigarettes. She has a 58.75 pack-year smoking history. She has never used smokeless tobacco. She reports that she does not drink alcohol and does not use drugs. She has a 21 pack year smoking history (1/2 pack/day from age [age]).  The patient works at [employer]. She is exposed to cold temperatures. She works 3rd shift. She has a daughter, Yehuda Mao and a daughter named [name]. She lives in [place]. The patient is alone today.  Allergies:  Allergies  Allergen Reactions  . Ciprofloxacin Swelling    Facial swelling following single oral dose.   . Codeine Anaphylaxis  . Nsaids Other (See Comments)    PT HAS HEPC PT HAS HEPC Patient has Hep C.     Current Medications: Current Outpatient Medications  Medication Sig Dispense Refill  . acetaminophen (TYLENOL) 650 MG CR tablet Take 650 mg by mouth every 8 (eight) hours as needed for pain.    [name] Kitchen albuterol (PROVENTIL HFA;VENTOLIN HFA) 108 (90 Base) MCG/ACT inhaler Inhale 2 puffs into the lungs every 6 (six) hours as needed for wheezing or shortness of breath.     . allopurinol (ZYLOPRIM) 100 MG tablet TAKE 1 TABLET BY MOUTH EVERY DAY 30 tablet 5  . clopidogrel (PLAVIX) 75 MG tablet Take 1 tablet (75 mg total) by mouth daily. 30 tablet 5  . cyanocobalamin 500 MCG tablet Take 500 mcg by mouth daily.    . diclofenac Sodium (VOLTAREN) 1 % GEL Apply 2 g topically 4 (four) times daily. 100 g 2  . entecavir (BARACLUDE) 0.5 MG tablet Take 0.5 mg by mouth every other day.    . folic acid (FOLVITE) 1 MG tablet TAKE 1 TABLET (1 MG TOTAL) BY MOUTH DAILY. 90 tablet 1  . gabapentin (NEURONTIN) 100 MG capsule Take 1 capsule (100 mg total) by mouth 2 (two) times daily. 60 capsule 0  . hydroxychloroquine (PLAQUENIL) 200 MG tablet Take 200 mg by mouth 2 (two) times daily.    [name] Kitchen levothyroxine (SYNTHROID, LEVOTHROID) 75 MCG tablet Take 75 mcg by mouth daily before breakfast.     . omeprazole (PRILOSEC) 20 MG capsule Take 1 capsule (20 mg total) by mouth daily. 60 capsule 0  . pravastatin (PRAVACHOL) 40 MG tablet Take 40 mg by mouth daily.    [name] Kitchen sulfamethoxazole-trimethoprim (BACTRIM DS) 800-160 MG tablet indefinite prophylactic dose     No current facility-administered  medications for this visit.    Review of Systems  Constitutional: Negative for chills, diaphoresis, fever and malaise/fatigue.       Feels "ok."  HENT: Negative for congestion, ear discharge, ear pain, hearing loss, nosebleeds, sinus pain, sore throat and tinnitus.   Eyes: Negative.  Negative for blurred vision, double vision, photophobia and pain.  Respiratory: Negative.  Negative for cough, hemoptysis, sputum production and shortness of breath.   Cardiovascular: Negative.  Negative for chest pain, palpitations and leg swelling.  Gastrointestinal: Positive for diarrhea (watery, x 1 week). Negative for abdominal pain, blood in stool, constipation, heartburn, melena, nausea and vomiting.  Genitourinary: Negative.  Negative for dysuria, frequency, hematuria and urgency.  Musculoskeletal: Negative.  Negative for back pain, joint pain, myalgias and neck pain.  Skin: Positive for rash. Negative for itching.       Shingles rash has crusted over.  "Knot" on abdomen at one of the sites where she got Lovenox injections.  Neurological: Negative for dizziness,  tingling, sensory change, weakness and headaches.  Endo/Heme/Allergies: Negative for environmental allergies. Does not bruise/bleed easily.  Psychiatric/Behavioral: Negative for depression and memory loss. The patient is not nervous/anxious and does not have insomnia.   All other systems reviewed and are negative.  Performance status (ECOG): 1  Vital Signs There were no vitals taken for this visit.  Physical Exam Vitals and nursing note reviewed.  Constitutional:      General: She is not in acute distress.    Appearance: She is not diaphoretic.  HENT:     Head: Normocephalic and atraumatic.     Mouth/Throat:     Mouth: Mucous membranes are moist.     Pharynx: Oropharynx is clear.  Eyes:     General: No scleral icterus.    Extraocular Movements: Extraocular movements intact.     Conjunctiva/sclera: Conjunctivae normal.     Pupils:  Pupils are equal, round, and reactive to light.  Cardiovascular:     Rate and Rhythm: Normal rate and regular rhythm.     Heart sounds: Normal heart sounds. No murmur heard.   Pulmonary:     Effort: Pulmonary effort is normal. No respiratory distress.     Breath sounds: Normal breath sounds. No wheezing or rales.  Chest:     Chest wall: No tenderness.  Breasts:     Right: No axillary adenopathy or supraclavicular adenopathy.     Left: No axillary adenopathy or supraclavicular adenopathy.    Abdominal:     General: Bowel sounds are normal. There is no distension.     Palpations: Abdomen is soft. There is no mass.     Tenderness: There is no abdominal tenderness. There is no guarding or rebound.  Musculoskeletal:        General: No swelling or tenderness. Normal range of motion.     Cervical back: Normal range of motion and neck supple.  Lymphadenopathy:     Head:     Right side of head: No preauricular, posterior auricular or occipital adenopathy.     Left side of head: No preauricular, posterior auricular or occipital adenopathy.     Cervical: No cervical adenopathy.     Upper Body:     Right upper body: No supraclavicular or axillary adenopathy.     Left upper body: No supraclavicular or axillary adenopathy.     Lower Body: No right inguinal adenopathy. No left inguinal adenopathy.  Skin:    General: Skin is warm and dry.     Findings: Rash (variccella lesions on left lower anterior leg has crusted over) present.     Comments: Hematoma on left lower abdomen s/p Lovenox injection.  Neurological:     Mental Status: She is alert and oriented to person, place, and time.  Psychiatric:        Behavior: Behavior normal.        Thought Content: Thought content normal.        Judgment: Judgment normal.    Imaging studies: 07/07/2016:  Chest, abdomen, and pelvis CTangiogram revealed moderate diffuse atherosclerotic vascular disease of the abdominal aorta with severe stenosis of  the left common iliac artery with suspected short segment occlusion. There was no adenopathy. Spleen was normal.  04/15/2017:  Abdominal ultrasoundrevealed a normal spleen and sludge in the gallbladder. The liver was echogenic consistent fatty infiltration and/or hepatocellular disease. 11/10/2017:  Abdomen and pelvic CTrevealed a 2.2 cm spiculated density in right middle lobe concerning for malignancy.  11/16/2017:  Chest CTon 11/16/2017 revealed a 2.2  x 2.0 x 2.4 spiculated RIGHT middle lobe mass. There was tiny nonspecific upper lobe pulmonary nodules greater on RIGHT, largest 3 mm, of uncertain etiology. There was additional 8 x 8 mm opacity in the posterior sulcus of the RIGHT lower lobe. 11/22/2017:  PET scanrevealed a 2 cm hypermetabolic right middle lobe lung mass (SUV 3.4) consistent with primary lung neoplasm. There were no findings for mediastinal/hilar lymphadenopathy or metastatic disease. There were areas of hypermetabolism involving the left oblique abdominal muscles and the anorectal junction. 02/06/2018:  Chest CT revealed the spiculated 2.2 cm right middle lobe lesion was almost completely resolved with only a residual 4 mm irregular nodule identified at this location on a background of architectural distortion/evolving scar. There was a 5 mm right parahilar nodule stable since 11/16/2017(not present on 07/07/2016). There were tiny nodules in both upper lobes suggesting inhalation etiology. 06/27/2019:  Chest CT revealed the nodule of the right middle lobe had almost completely resolved, with residual linear scarring. There was persistent paramedian consolidation of the medial right middle lobe and lingula, generally in keeping with atypical infection, particularly atypical mycobacterium. There had been interval increase in size of a small nodule of the right middle lobe, now measuring 4 mm (previously 2 mm).   There was new 6 mm ground-glass opacity of the lateral left upper  lobe measuring.  Although nonspecific, these were likely infectious or inflammatory in nature.  Attention on follow-up was receommended. There was unchanged tiny biapical pulmonary nodules, benign sequelae of nonspecific infection or inflammation, possibly related cigarette smoking, atypical infection, or other inhalational, inflammatory lung disease. There was hepatic steatosis. 12/26/2019:  Chest CT on 12/26/2019 revealed stable sub-cm bilateral pulmonary nodules, consistent with benign etiology. There was stable paramedian consolidation in the right middle lobe and lingula. There was a new airspace disease in inferior right middle lobe, with a few adjacent subcentimeter nodules. Overall, above findings were consistent with waxing and waning atypical infectious process, such as MAI.   07/24/2020:  Low dose chest CT revealed Lung-RADS 2, benign appearance or behavior. There was emphysema, aortic atherosclerosis, and coronary artery calcifications.   No visits with results within 3 Day(s) from this visit.  Latest known visit with results is:  Appointment on 12/18/2020  Component Date Value Ref Range Status  . Sodium 12/18/2020 141  135 - 145 mmol/L Final  . Potassium 12/18/2020 3.9  3.5 - 5.1 mmol/L Final  . Chloride 12/18/2020 105  98 - 111 mmol/L Final  . CO2 12/18/2020 30  22 - 32 mmol/L Final  . Glucose, Bld 12/18/2020 87  70 - 99 mg/dL Final   Glucose reference range applies only to samples taken after fasting for at least 8 hours.  . BUN 12/18/2020 16  8 - 23 mg/dL Final  . Creatinine, Ser 12/18/2020 1.17* 0.44 - 1.00 mg/dL Final  . Calcium 12/18/2020 9.1  8.9 - 10.3 mg/dL Final  . GFR, Estimated 12/18/2020 53* >60 mL/min Final   Comment: (NOTE) Calculated using the CKD-EPI Creatinine Equation (2021)   . Anion gap 12/18/2020 6  5 - 15 Final   Performed at [facility], [address], [place], [place] [address]  . WBC 12/18/2020 5.2  4.0 - 10.5 K/uL Final  . RBC 12/18/2020  3.93  3.87 - 5.11 MIL/uL Final  . Hemoglobin 12/18/2020 11.6* 12.0 - 15.0 g/dL Final  . HCT 12/18/2020 36.4  36.0 - 46.0 % Final  . MCV 12/18/2020 92.6  80.0 - 100.0 fL Final  .  MCH 12/18/2020 29.5  26.0 - 34.0 pg Final  . MCHC 12/18/2020 31.9  30.0 - 36.0 g/dL Final  . RDW 12/18/2020 14.3  11.5 - 15.5 % Final  . Platelets 12/18/2020 155  150 - 400 K/uL Final  . nRBC 12/18/2020 0.0  0.0 - 0.2 % Final  . Neutrophils Relative % 12/18/2020 64  % Final  . Neutro Abs 12/18/2020 3.3  1.7 - 7.7 K/uL Final  . Lymphocytes Relative 12/18/2020 21  % Final  . Lymphs Abs 12/18/2020 1.1  0.7 - 4.0 K/uL Final  . Monocytes Relative 12/18/2020 11  % Final  . Monocytes Absolute 12/18/2020 0.6  0.1 - 1.0 K/uL Final  . Eosinophils Relative 12/18/2020 3  % Final  . Eosinophils Absolute 12/18/2020 0.2  0.0 - 0.5 K/uL Final  . Basophils Relative 12/18/2020 1  % Final  . Basophils Absolute 12/18/2020 0.1  0.0 - 0.1 K/uL Final  . Immature Granulocytes 12/18/2020 0  % Final  . Abs Immature Granulocytes 12/18/2020 0.01  0.00 - 0.07 K/uL Final   Performed at [facility], [address], [place], [place] [address]    Assessment:  TASHALA KLOCKO is a 65 y.o. 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 One week prior to presentation, she felt like she was coming down with something. She denied any fever, runny nose, sore throat or cough. She had some diarrhea. She denied any new medications or herbal products.  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 Reticulocyte count was 11.3%. Ferritin was 562. Iron studies revealed a saturation of 20% and a TIBC of 214 (low). B12 was 231 (low normal). Folate was 42. Peripheral smear revealed rouleaux formation. Labs on 06/21/2019revealed normal flow cytometry, SPEP, and immunoglobulins.  Additional testingincluded the following+ studies: hepatitis C antibody,hepatitis B  core antibody,CMV IgM, and EBV VCA (IgM and IgG). Hepatitis B by PCRwas negative. Hepatitis C RNAwas negative. Mycoplasma pneumonia IgM was negative. Reticulocyte count was 11.3% (high) indicating appropriate marrow response. C3 and C4 were normal. Cold agglutinin titer was negative x 2. Negative studies included: ANA, hepatitis B surface antigen, SPEP, and free light chain ratio. 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 MMA was initially 330 (normal). Repeat MMA was elevated on 08/01/2016 confirming B12 deficiency. Hepatitis B surface antibody was positive and hepatitis B surface antigen was negative on 08/06/2016.  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 There was no adenopathy. Spleen was normal. Abdominal ultrasoundon 04/15/2017 revealed a normal spleen and sludge in the gallbladder. The liver was echogenic consistent fatty infiltration and/or hepatocellular disease.  She underwent PTCA and stent placementin right and left common iliac arteries and left external iliac artery on 07/10/2016.She is on Plavix.EGD on 03/19/2015 revealed gastritis in the body and antrum. Colonoscopyon 03/19/2015 revealed one hyperplastic polyp. EGDon 07/08/2016 was normal. No evidence of bleeding.  She has received6 units of warmed PRBCsto date (last on 08/01/2016).If Rituxan is needed, she requires entecavir 0.5 mg q day beginning 2 weeks prior to Rituxan. In addition, hepatitis B viral level and LFTs should be checked monthly.  She began steroids(1 mg/kg) on 08/01/2016. She stopped prednisoneon 03/12/2017. She is onfolic acid1 mg a day.   She hasB12 deficiency. B12 was 231 on 07/09/2016. She is on oral B12. B12 and  folate were normal on 03/09/2017 and  10/12/2017.  She developed peri-oral and intranasalherpes simplex-1. She was treated with valacyclovir and doxycycline on 10/19/2016. She developed a transient elevated alkaline phosphataseon 10/19/2016 which subsequently normalized.  She was documented to have awarm autoantibodyon 10/12/2017. LDH and bilirubin were normal. Hematocrit improved. She began prednisone10 mg on 12/07/2017 (discontinued 03/2018).She received Rituxanweekly x 4 (last dose 06/16/2018).  Entecavir was discontinued on 07/06/2019.  She developedflu like symptomson 10/26/2017. Symptoms included cough, myalgias, and fever (tmax unknown). She was prescribed Mucinex, Tamiflu, and amoxicillin. She only took amoxicillin x 5 days. She developedincreased liver function testson 11/02/2017. CMV IgG was positive. EBV VCA IgG, NA IgG, early antigen antibody IgG were elevated. EBV VCA IgM was <36. Testing was c/w a convalescence/past infection or reactivated infection. LFTs normalized on 11/15/2017. Smooth muscle antibodywas 39 (high) on 11/10/2017 and 34 (high) on 11/30/2017.   Labs on 06/12/2020 revealed recurrent anemia.  Hematocrit was 27.6, hemoglobin 8.4, MCV 104.5, platelets 138,000, WBC 3,400 (ANC 2,300). Creatinine was 1.17 (CrCl 50 ml/min).  Normal labs included ferritin (434), iron studies, B12 (885), folate (> 100).  Coombs polyspecific AGH test was positive (warm autoantibody).  Reticulocyte count was 7.1%.  LDH was 204 (98-192).  Peripheral smear revealed macrocytic anemia with mild thrombocytopenia. The morphology of RBCs, WBCs, and platelets were within normal limits. There was no evidence of circulating blasts or schistocytes.  She began prednisone 1 mg/kg (80 mg) on 06/16/2020.  Prednisone was decreased to 20 mg a day on 111/24/2021.  She received Rituxan 1000 mg on day 1 and 15 (07/15/2020 and 07/29/2020).  She began entecavir on 06/28/2020.  She is off prednisone.  She is on Septra for PCP  prophylaxis.  Septra is currently on hold.   Chest CTon 11/16/2017 revealed a 2.2 x 2.0 x 2.4 spiculated RIGHT middle lobe mass. There was tiny nonspecific upper lobe pulmonary nodules greater on RIGHT, largest 3 mm, of uncertain etiology. There was additional 8 x 8 mm opacity in the posterior sulcus of the RIGHT lower lobe. PET scanon 11/22/2017 revealed a 2 cm hypermetabolic right middle lobe lung mass (SUV 3.4) consistent with primary lung neoplasm. There were no findings for mediastinal/hilar lymphadenopathy or metastatic disease. There were areas of hypermetabolism involving the left oblique abdominal muscles and the anorectal junction.  PFTs on 11/18/2017 revealed an FEV1 of 1.22 liters (51%). DLCO adj was 6.8 mL/mmHg/min (32%).  ENB done on 12/07/2017. Cytology was negative for malignancy. Pathologydemonstrated organizing pneumonia and chronic bronchitis. Pathologist commented that organizing pneumonia spanned about 2 mm in one fragment. Cases of focal organizing pneumonia with hypermetabolism on FDG-PET have been described, but changes of this type can also be adjacent to a neoplasm. Patient prescribed a daily dose of Prednisone 10 mg, with re-imaging planned in 6-8 weeks.   Low dose chest CT on 07/24/2020 revealed Lung-RADS 2, benign appearance or behavior. There was emphysema, aortic atherosclerosis, and coronary artery calcifications.   She has diarrhea. 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 She received azithromycin x 3 days. She received ciprofloxacin x 10 days without improvement. She has seen ID in [place]. She began Bactrim on 03/04/2018 (discontinued on 03/11/2018) secondary to increase in creatine.  She has renal insufficiency. Creatininehas been followed: 1.17 on 11/05/2017, 1.85 on 11/09/2017, 1.38 on 11/12/2017, 1.74 on 11/15/2017, 1.66 on 11/17/2017, and 1.48 on 11/23/2017. Urinalysis on 11/15/2017 revealed revealed no  hemoglobin, bilirubin or active sediment.  Liver function tests increased on 02/15/2019.  Work-up on 02/16/2019 revealed hepatitis B E antibody positive.  Negative studies included: hepatitis A total antibody, hepatitis A IgM, hepatitis B surface antigen, hepatitis B E antigen.  Hepatitis B DNA was not detected.  Folate was 80.5 and vitamin B12 was 443.  She has received her vaccinations:  She has completed the PCV13, PPSV23, and HiB.  She received Menvio (quadrivalent meningoccal vaccine) and Bexero (univalent serogroup B meningoccal vaccine) on 07/06/2019.  She was [facility] to [facility] from 07/16/2020 - 07/18/2020 with a fever and UTI.  Blood and urine culture grew E coli.  She received ceftriaxone and was discharged on Septra DS 1 tablet BID.  Septra was switched to Keflex secondary to renal insufficiency.  She was diagnosed with C difficile + diarrhea on 08/12/2020.  She completed a course of oral vancomycin.  Stool was negative for C. difficile on 08/27/2020.  She was admitted to [facility] from 11/17/2020 - 11/20/2020 with left L5 varicella zoster.  Lesion was positive for VZV. She was treated with ceftazidime and a valacyclovir.  She was discharged on 9 days of Valtrex 1 gm 3 times daily followed by 500 mg daily indefinitely for suppression and doxycycline.  She notes that she tested positive for COVID-19 on 08/21/2019.  She received the Moderna COVID-19 vaccine on 11/10/2019 and 12/08/2019.   Symptomatically, she feels "ok."  L5 varicella zoster lesions have crusted over.   She has had watery diarrhea for 1 week.   Plan: 1.   Labs today: CBC with diff, CMP. 2.Warm autoimmune hemolytic anemia Clinically, she is doing well.       She active warm autoimmune hemolytic anemia responding well to Rituxan and a steroid taper. Hematocrit 27.6.  Hemoglobin   8.4.  LDH 204 (98-192) on 06/12/2020.                   Work-up confirmed warm autoantibodies.       Hematocrit 30.4.  Hemoglobin    9.2.  LDH 164 (98-192) on 06/19/2020.                   She began prednisone 1 mg/kg (80 mg) on 06/16/2020.       Hematocrit 32.7.  Hemoglobin 10.7 on 11/20/2020.   LDH was 173 on 10/31/2020.  Hematocrit 37.7.  Hemoglobin 12.4 in 12/04/2020.       She is s/p day 1 and 15 Rituxan (last 07/29/2020).       She is off prednisone.       Continue to monitor closely. 3.Hepatitis B Patient was previously exposed to hepatitis B. She was on entecavir x 1 year when she previously received Rituxan (stopped 07/06/2019). Patient did not complete her meningococcal vaccines.       She began entecavir 0.5 mg daily on 06/28/2020.                   Dose adjusted based on renal function.                   CrCl 30-49 ml/min   50% dosing (0.5 mg QOD).                   CrCl >= 50 ml/min 100% dosing (0.5 mg a day).  Creatinine fluctuates and is 0.95 today.  She is currently taking entecavir QOD.  Patient to be contacted when creatinine drawn for any adjustment in entecavir. 4.B12 deficiency B12 was 443 on 02/15/2019 and 885 on 06/12/2020.  Folate was 80.5 on 02/15/2019 and >100 on 06/12/2020.                         Folate was 30 on 11/17/2020.             She is currently on folic acid 1 mg a week.  Continue to monitor. 5. Right middle lobe nodule             Chest CT on 12/26/2019 revealed stable sub-cm bilateral pulmonary nodules c/w benign etiology.                          There was a new airspace disease in inferior right middle lobe, with a few adjacent subcentimeter nodules.                          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             Low-dose chest CT on 07/24/2020 revealedLung-RADS 2, benign appearance or behavior.             She is followed by Dr. Juliann Mule in pulmonary medicine. 6.   Left L5 varicella voster             Lesions have crusted over.  Valacyclovir was discontinued on 11/27/2020 by  Dr. Delaine Lame.  Per Dr Gwenevere Ghazi note on 11/28/2020, she will not need prophylactic valacyclovir. 7.   PCP prophylaxis             Patient was on Septra DS on Mondays, Wednesdays, and Fridays.  Patient's Septra currently on hold secondary to renal insufficiency documented during hospitalization.  Reinstitute Septra on Mondays, Wednesdays and Fridays.  Discussed plan to to dapsone if any concerns about insufficiency due to Septra. 8.   Thrombocytopenia and leukopenia, resolved  WBC 5200.  Platelets 149,000.    Etiology likely secondary to current infection and underlying autoimmune disease.  If platelet count requires treatment, consider IVIG.  Continue to monitor closely. 9.   Diarrhea  Patient has a history of C. Difficile.  Stool for C. difficile and GI by PCR. 10.   RTC in 1 week for labs (BMP). 11.   RTC in 2 weeks for labs (CBC with diff, BMP). 12.   RTC in 1 month for MD assessment and labs (CBC with diff, CMP).  I discussed the assessment and treatment plan with the patient.  The patient was provided an opportunity to ask questions and all were answered.  The patient agreed with the plan and demonstrated an understanding of the instructions.  The patient was advised to call back or seek an in person evaluation if the symptoms worsen or if the condition fails to improve as anticipated.   Nolon Stalls, MD, PhD  01/08/2021, 11:04 AM

## 2021-02-13 ENCOUNTER — Encounter: Payer: Self-pay | Admitting: Oncology

## 2021-02-13 ENCOUNTER — Other Ambulatory Visit: Payer: Self-pay

## 2021-02-13 ENCOUNTER — Inpatient Hospital Stay: Payer: BC Managed Care – PPO | Attending: Oncology

## 2021-02-13 ENCOUNTER — Inpatient Hospital Stay (HOSPITAL_BASED_OUTPATIENT_CLINIC_OR_DEPARTMENT_OTHER): Payer: BC Managed Care – PPO | Admitting: Oncology

## 2021-02-13 VITALS — BP 133/60 | HR 78 | Temp 96.5°F | Resp 16 | Wt 172.8 lb

## 2021-02-13 DIAGNOSIS — R768 Other specified abnormal immunological findings in serum: Secondary | ICD-10-CM

## 2021-02-13 DIAGNOSIS — D5911 Warm autoimmune hemolytic anemia: Secondary | ICD-10-CM

## 2021-02-13 DIAGNOSIS — B029 Zoster without complications: Secondary | ICD-10-CM | POA: Diagnosis not present

## 2021-02-13 DIAGNOSIS — Z8619 Personal history of other infectious and parasitic diseases: Secondary | ICD-10-CM | POA: Diagnosis not present

## 2021-02-13 DIAGNOSIS — N289 Disorder of kidney and ureter, unspecified: Secondary | ICD-10-CM | POA: Diagnosis not present

## 2021-02-13 DIAGNOSIS — R918 Other nonspecific abnormal finding of lung field: Secondary | ICD-10-CM | POA: Diagnosis not present

## 2021-02-13 DIAGNOSIS — I700 Atherosclerosis of aorta: Secondary | ICD-10-CM | POA: Diagnosis not present

## 2021-02-13 DIAGNOSIS — I2510 Atherosclerotic heart disease of native coronary artery without angina pectoris: Secondary | ICD-10-CM | POA: Insufficient documentation

## 2021-02-13 DIAGNOSIS — Z79899 Other long term (current) drug therapy: Secondary | ICD-10-CM | POA: Insufficient documentation

## 2021-02-13 DIAGNOSIS — Z833 Family history of diabetes mellitus: Secondary | ICD-10-CM | POA: Diagnosis not present

## 2021-02-13 DIAGNOSIS — B1910 Unspecified viral hepatitis B without hepatic coma: Secondary | ICD-10-CM | POA: Insufficient documentation

## 2021-02-13 DIAGNOSIS — J449 Chronic obstructive pulmonary disease, unspecified: Secondary | ICD-10-CM | POA: Insufficient documentation

## 2021-02-13 DIAGNOSIS — Z8249 Family history of ischemic heart disease and other diseases of the circulatory system: Secondary | ICD-10-CM | POA: Diagnosis not present

## 2021-02-13 DIAGNOSIS — E538 Deficiency of other specified B group vitamins: Secondary | ICD-10-CM

## 2021-02-13 DIAGNOSIS — R911 Solitary pulmonary nodule: Secondary | ICD-10-CM

## 2021-02-13 LAB — CBC WITH DIFFERENTIAL/PLATELET
Abs Immature Granulocytes: 0 10*3/uL (ref 0.00–0.07)
Basophils Absolute: 0.1 10*3/uL (ref 0.0–0.1)
Basophils Relative: 1 %
Eosinophils Absolute: 0.2 10*3/uL (ref 0.0–0.5)
Eosinophils Relative: 4 %
HCT: 37.1 % (ref 36.0–46.0)
Hemoglobin: 12.2 g/dL (ref 12.0–15.0)
Immature Granulocytes: 0 %
Lymphocytes Relative: 32 %
Lymphs Abs: 1.2 10*3/uL (ref 0.7–4.0)
MCH: 28.8 pg (ref 26.0–34.0)
MCHC: 32.9 g/dL (ref 30.0–36.0)
MCV: 87.5 fL (ref 80.0–100.0)
Monocytes Absolute: 0.4 10*3/uL (ref 0.1–1.0)
Monocytes Relative: 12 %
Neutro Abs: 1.9 10*3/uL (ref 1.7–7.7)
Neutrophils Relative %: 51 %
Platelets: 143 10*3/uL — ABNORMAL LOW (ref 150–400)
RBC: 4.24 MIL/uL (ref 3.87–5.11)
RDW: 14.4 % (ref 11.5–15.5)
WBC: 3.6 10*3/uL — ABNORMAL LOW (ref 4.0–10.5)
nRBC: 0 % (ref 0.0–0.2)

## 2021-02-13 LAB — COMPREHENSIVE METABOLIC PANEL
ALT: 12 U/L (ref 0–44)
AST: 17 U/L (ref 15–41)
Albumin: 3.9 g/dL (ref 3.5–5.0)
Alkaline Phosphatase: 74 U/L (ref 38–126)
Anion gap: 5 (ref 5–15)
BUN: 30 mg/dL — ABNORMAL HIGH (ref 8–23)
CO2: 29 mmol/L (ref 22–32)
Calcium: 9.3 mg/dL (ref 8.9–10.3)
Chloride: 107 mmol/L (ref 98–111)
Creatinine, Ser: 1.21 mg/dL — ABNORMAL HIGH (ref 0.44–1.00)
GFR, Estimated: 50 mL/min — ABNORMAL LOW (ref >60)
Glucose, Bld: 103 mg/dL — ABNORMAL HIGH (ref 70–99)
Potassium: 4.1 mmol/L (ref 3.5–5.1)
Sodium: 141 mmol/L (ref 135–145)
Total Bilirubin: 0.4 mg/dL (ref 0.3–1.2)
Total Protein: 7 g/dL (ref 6.5–8.1)

## 2021-02-15 ENCOUNTER — Encounter: Payer: Self-pay | Admitting: Hematology and Oncology

## 2021-02-15 NOTE — Progress Notes (Signed)
[facility]  [address] [place], [place] [address] Phone: [phone]  Fax: [phone]   Clinic Day:  02/15/2021  Referring physician: Frazier Richards, MD  Chief Complaint: Tina Page is a 65 y.o. 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  Reticulocyte count was 11.3%.  Ferritin was 562.  Iron studies revealed a saturation of 20% and a TIBC of 214 (low).  B12 was 231 (low normal).  Folate was 42.   Peripheral smear revealed rouleaux formation.    Additional testing included the following + studies:  hepatitis C antibody, hepatitis B core antibody, CMV IgM, and EBV VCA (IgM and IgG).  Hepatitis B by PCR was negative.  Hepatitis C RNA was negative.  Mycoplasma pneumonia IgM was negative.  Reticulocyte count was 11.3% (high) indicating appropriate marrow response.  C3 and C4 were normal.  Cold agglutinin titer was negative x 2.  Negative studies included:  ANA, hepatitis B surface antigen, SPEP, and free light chain ratio.   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  MMA was initially 330 (normal).  Repeat MMA was elevated on 08/01/2016 confirming B12 deficiency.  Hepatitis B surface antibody was positive and hepatitis B surface antigen was negative on 08/06/2016.   07/07/2016  Chest, abdomen, and pelvis CT angiogram on revealed moderate diffuse atherosclerotic vascular disease of the abdominal aorta with severe stenosis of the left common iliac artery with suspected short segment occlusion. There was no adenopathy.  Spleen was normal.   04/15/2017 Abd US revealed a normal spleen and sludge  in the gallbladder.  The liver was echogenic consistent fatty infiltration and/or hepatocellular disease.  For hemolytic anemia treatments, in 2017, She has received multiple units of  warmed PRBCs to date.  08/01/2016-03/12/2017, steroid treatment with prednisone.  She is also on folic acid 1 mg daily. Borderline low vitamin B12 level on 07/09/2016.  Patient has been on oral vitamin B12 supplementation.  10/12/2017  positive warm autoantibody.  LDH and bilirubin were normal.   She began prednisone 10 mg on 12/07/2017 (discontinued 03/2018).  She received Rituxan weekly x 4 (last dose 06/16/2018).  Entecavir was discontinued on 07/06/2019.  06/12/2020 developed recurrent anemia.  Hematocrit was 27.6, hemoglobin 8.4, MCV 104.5, platelets 138,000, WBC 3,400 (ANC 2,300). Creatinine was 1.17 (CrCl 50 ml/min).  Positive warm autoantibody  Reticulocyte count was 7.1%.  LDH was 204 (98-192).  06/16/2020, began prednisone 1 mg/kilogram treatments, 07/24/2020, prednisone was tapered down to 20 mg.  She received Rituxan 1000 mg on day 1 and 15 (07/15/2020 and 07/29/2020).  She began entecavir on 06/28/2020.  Prednisone was tapered off.  She was on Septra for PCP prophylaxis which is now currently on hold    Other medical problems 07/10/2016.  She underwent PTCA and stent placement in right and left common iliac arteries and left external iliac artery. She is on Plavix.   03/19/2015 EGD revealed gastritis in the body and antrum.  Colonoscopy revealed one hyperplastic polyp.  Repeat EGD on 07/08/2016 was normal.  No evidence of bleeding.  History of she developed peri-oral and intranasal herpes simplex-1.  She was treated with valacyclovir and doxycycline on 10/19/2016.   10/26/2017.  She developed flu like symptoms.  Symptoms included cough, myalgias, and fever (tmax unknown).  She was prescribed Mucinex, Tamiflu, and amoxicillin.  She only took amoxicillin x 5 days.  She developed increased liver function tests  on 11/02/2017. CMV IgG was positive.  EBV VCA IgG, NA IgG, early antigen antibody IgG were elevated.  EBV VCA IgM was < 36.  Testing was c/w a convalescence/past infection or reactivated infection.  LFTs normalized on 11/15/2017.  Smooth muscle antibody was 39 (high) on 11/10/2017 and 34 (high) on 11/30/2017.     Chest CT on 11/16/2017 revealed a 2.2 x 2.0 x 2.4 spiculated RIGHT middle lobe mass.  There was tiny nonspecific upper lobe pulmonary nodules greater on RIGHT, largest 3 mm, of uncertain etiology.  There was additional 8 x 8 mm opacity in the posterior sulcus of the RIGHT lower lobe. PET scan on 11/22/2017 revealed a 2 cm hypermetabolic right middle lobe lung mass (SUV 3.4) consistent  with primary lung neoplasm.  There were no findings for mediastinal/hilar lymphadenopathy or metastatic disease.  There were areas of hypermetabolism involving the left oblique abdominal muscles and the anorectal junction. PFTs on 11/18/2017 revealed an FEV1 of 1.22 liters (51%).  DLCO adj was 6.8 mL/mmHg/min (32%).   ENB done on 12/07/2017. Cytology was negative for malignancy. Pathology demonstrated organizing pneumonia and chronic bronchitis. Pathologist commented that organizing pneumonia spanned about 2 mm in one fragment. Cases of focal organizing pneumonia with hypermetabolism on FDG-PET have been described, but changes of this type can also be adjacent to a neoplasm. Patient prescribed a daily dose of Prednisone 10 mg   07/24/2020  repeat  chest CT on  revealed Lung-RADS 2, benign appearance or behavior. There was emphysema, aortic atherosclerosis, and coronary artery calcifications.  diarrhea.  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  She received azithromycin x 3 days.  She received ciprofloxacin x 10 days without improvement.  She has seen ID in [place].  She began Bactrim on 03/04/2018 (discontinued on 03/11/2018) secondary to increase in creatine.   Chronic kidney  disease Urinalysis on 11/15/2017 revealed revealed no hemoglobin, bilirubin or active sediment.   Liver function tests increased on 02/15/2019.  Work-up on 02/16/2019 revealed hepatitis B E antibody positive.  Negative studies included: hepatitis A total antibody, hepatitis A IgM, hepatitis B surface antigen, hepatitis B E antigen.  Hepatitis B DNA was not detected.  Folate was 80.5 and vitamin B12 was 443.  She has received her vaccinations:  She has completed the PCV13, PPSV23, and HiB.  She received Menvio (quadrivalent meningoccal vaccine) and Bexero (univalent serogroup B meningoccal vaccine) on 07/06/2019.  She was diagnosed with C difficile + diarrhea on 08/12/2020.  She completed a course of oral vancomycin.  Stool was negative for C. difficile on 08/27/2020.  11/17/2020 - 11/20/2020 [facility] admission with left L5 varicella zoster.  Lesion was positive for VZV. She was treated with ceftazidime and a valacyclovir.  She was discharged on 9 days of Valtrex 1 gm 3 times daily followed by 500 mg daily indefinitely for suppression and doxycycline.  She notes that she tested positive for COVID-19 on 08/21/2019.  She received the Moderna COVID-19 vaccine on 11/10/2019 and 12/08/2019.   History of discoid lupus  INTERVAL HISTORY Tina Page is a 65 y.o. female who has above history reviewed by me today presents for follow up visit  Overall she reports doing very well.  No new complaints.    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/23/2017   Post PTCA  07/12/2016   Thrombocytopenia ([place]) 10/04/2016    Past Surgical History:  Procedure Laterality Date   ELECTROMAGNETIC NAVIGATION BROCHOSCOPY N/A 12/07/2017   Procedure: ELECTROMAGNETIC NAVIGATION BRONCHOSCOPY;  Surgeon: Flora Lipps, MD;  Location: [facility] ORS;  Service: Cardiopulmonary;  Laterality: N/A;   ESOPHAGOGASTRODUODENOSCOPY (EGD) WITH PROPOFOL N/A 07/08/2016   Procedure: ESOPHAGOGASTRODUODENOSCOPY (EGD) WITH PROPOFOL;  Surgeon: Manya Silvas, MD;  Location: [facility] ENDOSCOPY;  Service: Endoscopy;  Laterality: N/A;   KNEE SURGERY Right    repair of acl tear   PERIPHERAL VASCULAR CATHETERIZATION N/A 07/10/2016   Procedure: Lower Extremity Angiography;  Surgeon: Katha Cabal, MD;  Location: [place];  Service: Cardiovascular;  Laterality: N/A;   PERIPHERAL VASCULAR CATHETERIZATION N/A 07/10/2016   Procedure: Abdominal Aortogram w/Lower Extremity;  Surgeon: Katha Cabal, MD;  Location: [place] [facility];  Service: Cardiovascular;  Laterality: N/A;   PERIPHERAL VASCULAR CATHETERIZATION  07/10/2016   Procedure: Lower Extremity Intervention;  Surgeon: Katha Cabal, MD;  Location: [place] [facility];  Service: Cardiovascular;;    Family History  Problem Relation Age of Onset   Diabetes Mother    Hypertension Mother    Diabetes Maternal Grandfather    Hypertension Maternal Grandfather    Breast cancer Neg Hx     Social History:  reports that she quit smoking about 4 years ago. Her smoking use included cigarettes. She has a 58.75 pack-year smoking history. She has never used smokeless tobacco. She reports that she does not drink alcohol and does not use drugs. She has a 21 pack year smoking history (1/2 pack/day from age [age]).  The patient works at [employer]  She is exposed to cold temperatures.  She works 3rd shift.  She has a daughter, [name] and a daughter named [name].  She lives in [place]. The patient is alone today.  Allergies:   Allergies  Allergen Reactions   Ciprofloxacin Swelling    Facial swelling following single oral dose.    Codeine Anaphylaxis   Nsaids Other (See Comments)    PT HAS HEPC PT HAS HEPC Patient has Hep C.     Current Medications: Current Outpatient Medications  Medication Sig Dispense Refill   acetaminophen (TYLENOL) 650 MG CR tablet Take 650 mg by mouth every 8 (eight) hours as needed for pain.     albuterol (PROVENTIL HFA;VENTOLIN HFA) 108 (90 Base) MCG/ACT inhaler Inhale 2 puffs into the lungs every 6 (six) hours as needed for wheezing or shortness of breath.      allopurinol (ZYLOPRIM) 100 MG tablet TAKE 1 TABLET BY MOUTH EVERY DAY 30 tablet 5   clopidogrel (PLAVIX) 75 MG tablet Take 1 tablet (75 mg total) by mouth daily. 30 tablet 5   cyanocobalamin 500 MCG tablet Take 500 mcg by mouth daily.     folic acid (FOLVITE) 1 MG tablet TAKE 1 TABLET (1 MG TOTAL) BY MOUTH DAILY. 90 tablet 1   hydroxychloroquine (PLAQUENIL) 200 MG tablet Take 200  mg by mouth 2 (two) times daily.     levothyroxine (SYNTHROID, LEVOTHROID) 75 MCG tablet Take 75 mcg by mouth daily before breakfast.      omeprazole (PRILOSEC) 20 MG capsule Take 1 capsule (20 mg total) by mouth daily. 60 capsule 0   triamcinolone (KENALOG) 0.025 % cream Apply topically 2 (two) times daily.     diclofenac Sodium (VOLTAREN) 1 % GEL Apply 2 g topically 4 (four) times daily. 100 g 2   entecavir (BARACLUDE) 0.5 MG tablet Take 0.5 mg by mouth every other day.     gabapentin (NEURONTIN) 100 MG capsule Take 1 capsule (100 mg total) by mouth 2 (two) times daily. (Patient not taking: No sig reported) 60 capsule 0   hydrochlorothiazide (HYDRODIURIL) 25 MG tablet Take 1 tablet by mouth daily. for high blood pressure (Patient not taking: Reported on 02/13/2021)     hydroxychloroquine (PLAQUENIL) 200 MG tablet Take 1 tablet by mouth 2 (two) times daily. (Patient not taking: Reported on 02/13/2021)     Potassium Chloride ER 20 MEQ TBCR TAKE 1  TABLET BY MOUTH ONCE DAILY FOR LOW POTASSIUM (Patient not taking: Reported on 02/13/2021)     pravastatin (PRAVACHOL) 40 MG tablet Take 40 mg by mouth daily.     sulfamethoxazole-trimethoprim (BACTRIM DS) 800-160 MG tablet indefinite prophylactic dose (Patient not taking: Reported on 01/08/2021)     No current facility-administered medications for this visit.    Review of Systems  Constitutional:  Negative for chills, diaphoresis, fever and malaise/fatigue.       Feels "ok."  HENT:  Negative for congestion, ear discharge, ear pain, hearing loss, nosebleeds, sinus pain, sore throat and tinnitus.   Eyes: Negative.  Negative for blurred vision, double vision, photophobia and pain.  Respiratory: Negative.  Negative for cough, hemoptysis, sputum production and shortness of breath.   Cardiovascular: Negative.  Negative for chest pain, palpitations and leg swelling.  Gastrointestinal:  Positive for diarrhea (watery, x 1 week). Negative for abdominal pain, blood in stool, constipation, heartburn, melena, nausea and vomiting.  Genitourinary: Negative.  Negative for dysuria, frequency, hematuria and urgency.  Musculoskeletal: Negative.  Negative for back pain, joint pain, myalgias and neck pain.  Skin:  Positive for rash. Negative for itching.       Shingles rash has crusted over.  "Knot" on abdomen at one of the sites where she got Lovenox injections.  Neurological:  Negative for dizziness, tingling, sensory change, weakness and headaches.  Endo/Heme/Allergies:  Negative for environmental allergies. Does not bruise/bleed easily.  Psychiatric/Behavioral:  Negative for depression and memory loss. The patient is not nervous/anxious and does not have insomnia.   All other systems reviewed and are negative. Performance status (ECOG): 1  Vital Signs Blood pressure 133/60, pulse 78, temperature (!) 96.5 F (35.8 C), resp. rate 16, weight 172 lb 13.5 oz (78.4 kg), SpO2 100 %.  Physical Exam Vitals and  nursing note reviewed.  Constitutional:      General: She is not in acute distress.    Appearance: She is not diaphoretic.  HENT:     Head: Normocephalic and atraumatic.     Mouth/Throat:     Mouth: Mucous membranes are moist.     Pharynx: Oropharynx is clear.  Eyes:     General: No scleral icterus.    Extraocular Movements: Extraocular movements intact.     Conjunctiva/sclera: Conjunctivae normal.     Pupils: Pupils are equal, round, and reactive to light.  Cardiovascular:  Rate and Rhythm: Normal rate and regular rhythm.     Heart sounds: Normal heart sounds. No murmur heard. Pulmonary:     Effort: Pulmonary effort is normal. No respiratory distress.     Breath sounds: Normal breath sounds. No wheezing or rales.  Chest:     Chest wall: No tenderness.  Breasts:    Right: No axillary adenopathy or supraclavicular adenopathy.     Left: No axillary adenopathy or supraclavicular adenopathy.  Abdominal:     General: Bowel sounds are normal. There is no distension.     Palpations: Abdomen is soft. There is no mass.     Tenderness: There is no abdominal tenderness. There is no guarding or rebound.  Musculoskeletal:        General: No swelling or tenderness. Normal range of motion.     Cervical back: Normal range of motion and neck supple.  Lymphadenopathy:     Head:     Right side of head: No preauricular, posterior auricular or occipital adenopathy.     Left side of head: No preauricular, posterior auricular or occipital adenopathy.     Cervical: No cervical adenopathy.     Upper Body:     Right upper body: No supraclavicular or axillary adenopathy.     Left upper body: No supraclavicular or axillary adenopathy.     Lower Body: No right inguinal adenopathy. No left inguinal adenopathy.  Skin:    General: Skin is warm and dry.     Findings: Rash (variccella lesions on left lower anterior leg has crusted over) present.     Comments: Hematoma on left lower abdomen s/p Lovenox  injection.  Neurological:     Mental Status: She is alert and oriented to person, place, and time.  Psychiatric:        Behavior: Behavior normal.        Thought Content: Thought content normal.        Judgment: Judgment normal.   Laboratory findings CBC    Component Value Date/Time   WBC 3.6 (L) 02/13/2021 1408   RBC 4.24 02/13/2021 1408   HGB 12.2 02/13/2021 1408   HGB 9.0 (L) 02/18/2018 1204   HCT 37.1 02/13/2021 1408   HCT 46.5 06/11/2014 1732   PLT 143 (L) 02/13/2021 1408   PLT 127 (L) 06/11/2014 1732   MCV 87.5 02/13/2021 1408   MCV 92 06/11/2014 1732   MCH 28.8 02/13/2021 1408   MCHC 32.9 02/13/2021 1408   RDW 14.4 02/13/2021 1408   RDW 14.5 06/11/2014 1732   LYMPHSABS 1.2 02/13/2021 1408   MONOABS 0.4 02/13/2021 1408   EOSABS 0.2 02/13/2021 1408   BASOSABS 0.1 02/13/2021 1408   CMP Latest Ref Rng & Units 02/13/2021 01/08/2021 12/18/2020  Glucose 70 - 99 mg/dL 103(H) 104(H) 87  BUN 8 - 23 mg/dL 30(H) 18 16  Creatinine 0.44 - 1.00 mg/dL 1.21(H) 0.86 1.17(H)  Sodium 135 - 145 mmol/L 141 140 141  Potassium 3.5 - 5.1 mmol/L 4.1 3.9 3.9  Chloride 98 - 111 mmol/L 107 106 105  CO2 22 - 32 mmol/L 29 29 30   Calcium 8.9 - 10.3 mg/dL 9.3 9.2 9.1  Total Protein 6.5 - 8.1 g/dL 7.0 7.0 -  Total Bilirubin 0.3 - 1.2 mg/dL 0.4 0.6 -  Alkaline Phos 38 - 126 U/L 74 72 -  AST 15 - 41 U/L 17 16 -  ALT 0 - 44 U/L 12 13 -     Assessment and plan   1.  Warm antibody hemolytic anemia (HCC)   2. Hepatitis B core antibody positive   3. B12 deficiency   4. Herpes zoster without complication   5. Renal insufficiency   6. Nodule of middle lobe of right lung    #History of recurrent warm antibody hemolytic anemia, Clinically she is doing very well, Labs reviewed and discussed with patient With hemoglobin has been stable at 12.2, MCV 87.5, Observation  #History of vitamin B12 deficiency. Continue oral vitamin B12 500 MCG daily.  #Positive hepatitis B core antibody She started  entecavir 0.5 mg daily on 06/28/2020. Dose adjusted based on renal function. CrCl 30-49 ml/min   50% dosing (0.5 mg QOD) CrCl >= 50 ml/min 100% dosing (0.5 mg a day). With her kidney function currently QOD  # Right middle lobe nodule, continue to follow pulmonology CT findings felt consistent with vaccine and waning process, probably atypical infectious process.  # Left L5 varicella voster, treated with valacyclovir. Per Dr Gwenevere Ghazi note on 11/28/2020, she will not need prophylactic valacyclovir. # PCP prophylaxis Patient was on Septra DS on Mondays, Wednesdays, and Fridays, which was held due to fluctuated kidney functions.   RTC 3 months-lab MD, CBC CMP reticulocyte panel, B12, LDH  I discussed the assessment and treatment plan with the patient.  The patient was provided an opportunity to ask questions and all were answered.  The patient agreed with the plan and demonstrated an understanding of the instructions.  The patient was advised to call back or seek an in person evaluation if the symptoms worsen or if the condition fails to improve as anticipated.  We spent sufficient time to discuss many aspect of care, questions were answered to patient's satisfaction. A total of 40 minutes was spent on this visit.  With 15 minutes spent reviewing previous medical records, image findings, pathology reports, 20 minutes counseling the patient on the diagnosis, goal of care, chemotherapy treatments, side effects of the treatment, management of symptoms.  Additional 5 minutes was spent on answering patient's questions.   Earlie Server, MD, PhD Hematology Oncology [place] at [facility] 02/15/2021

## 2021-03-26 ENCOUNTER — Ambulatory Visit
Admit: 2021-03-26 | Discharge: 2021-03-27 | Payer: PRIVATE HEALTH INSURANCE | Attending: Student in an Organized Health Care Education/Training Program | Primary: Student in an Organized Health Care Education/Training Program

## 2021-03-26 DIAGNOSIS — Z8619 Personal history of other infectious and parasitic diseases: Principal | ICD-10-CM

## 2021-03-26 DIAGNOSIS — D591 AIHA (autoimmune hemolytic anemia) (CMS-HCC): Principal | ICD-10-CM

## 2021-03-26 DIAGNOSIS — R768 Other specified abnormal immunological findings in serum: Principal | ICD-10-CM

## 2021-03-26 DIAGNOSIS — B029 Zoster without complications: Principal | ICD-10-CM

## 2021-03-26 MED ORDER — ENTECAVIR 0.5 MG TABLET
ORAL_TABLET | Freq: Every day | ORAL | 1 refills | 90 days | Status: CP
Start: 2021-03-26 — End: 2021-09-22

## 2021-05-22 ENCOUNTER — Inpatient Hospital Stay: Payer: BC Managed Care – PPO | Attending: Oncology

## 2021-05-22 ENCOUNTER — Inpatient Hospital Stay (HOSPITAL_BASED_OUTPATIENT_CLINIC_OR_DEPARTMENT_OTHER): Payer: BC Managed Care – PPO | Admitting: Oncology

## 2021-05-22 ENCOUNTER — Encounter: Payer: Self-pay | Admitting: Oncology

## 2021-05-22 ENCOUNTER — Other Ambulatory Visit: Payer: Self-pay

## 2021-05-22 VITALS — BP 133/56 | HR 80 | Temp 97.4°F | Resp 16 | Wt 173.3 lb

## 2021-05-22 DIAGNOSIS — B029 Zoster without complications: Secondary | ICD-10-CM | POA: Diagnosis not present

## 2021-05-22 DIAGNOSIS — I2510 Atherosclerotic heart disease of native coronary artery without angina pectoris: Secondary | ICD-10-CM | POA: Insufficient documentation

## 2021-05-22 DIAGNOSIS — E538 Deficiency of other specified B group vitamins: Secondary | ICD-10-CM

## 2021-05-22 DIAGNOSIS — Z79899 Other long term (current) drug therapy: Secondary | ICD-10-CM | POA: Diagnosis not present

## 2021-05-22 DIAGNOSIS — I700 Atherosclerosis of aorta: Secondary | ICD-10-CM | POA: Diagnosis not present

## 2021-05-22 DIAGNOSIS — Z833 Family history of diabetes mellitus: Secondary | ICD-10-CM | POA: Insufficient documentation

## 2021-05-22 DIAGNOSIS — D5911 Warm autoimmune hemolytic anemia: Secondary | ICD-10-CM | POA: Insufficient documentation

## 2021-05-22 DIAGNOSIS — N189 Chronic kidney disease, unspecified: Secondary | ICD-10-CM | POA: Insufficient documentation

## 2021-05-22 DIAGNOSIS — B1910 Unspecified viral hepatitis B without hepatic coma: Secondary | ICD-10-CM | POA: Diagnosis not present

## 2021-05-22 DIAGNOSIS — Z8249 Family history of ischemic heart disease and other diseases of the circulatory system: Secondary | ICD-10-CM | POA: Insufficient documentation

## 2021-05-22 DIAGNOSIS — J449 Chronic obstructive pulmonary disease, unspecified: Secondary | ICD-10-CM | POA: Insufficient documentation

## 2021-05-22 DIAGNOSIS — R911 Solitary pulmonary nodule: Secondary | ICD-10-CM

## 2021-05-22 DIAGNOSIS — R768 Other specified abnormal immunological findings in serum: Secondary | ICD-10-CM

## 2021-05-22 DIAGNOSIS — Z8619 Personal history of other infectious and parasitic diseases: Secondary | ICD-10-CM | POA: Insufficient documentation

## 2021-05-22 DIAGNOSIS — R918 Other nonspecific abnormal finding of lung field: Secondary | ICD-10-CM | POA: Insufficient documentation

## 2021-05-22 LAB — CBC WITH DIFFERENTIAL/PLATELET
Abs Immature Granulocytes: 0.02 10*3/uL (ref 0.00–0.07)
Basophils Absolute: 0 10*3/uL (ref 0.0–0.1)
Basophils Relative: 0 %
Eosinophils Absolute: 0 10*3/uL (ref 0.0–0.5)
Eosinophils Relative: 0 %
HCT: 31.2 % — ABNORMAL LOW (ref 36.0–46.0)
Hemoglobin: 10.7 g/dL — ABNORMAL LOW (ref 12.0–15.0)
Immature Granulocytes: 0 %
Lymphocytes Relative: 7 %
Lymphs Abs: 0.5 10*3/uL — ABNORMAL LOW (ref 0.7–4.0)
MCH: 31 pg (ref 26.0–34.0)
MCHC: 34.3 g/dL (ref 30.0–36.0)
MCV: 90.4 fL (ref 80.0–100.0)
Monocytes Absolute: 0.2 10*3/uL (ref 0.1–1.0)
Monocytes Relative: 4 %
Neutro Abs: 5.9 10*3/uL (ref 1.7–7.7)
Neutrophils Relative %: 89 %
Platelets: 164 10*3/uL (ref 150–400)
RBC: 3.45 MIL/uL — ABNORMAL LOW (ref 3.87–5.11)
RDW: 15.7 % — ABNORMAL HIGH (ref 11.5–15.5)
WBC: 6.7 10*3/uL (ref 4.0–10.5)
nRBC: 0 % (ref 0.0–0.2)

## 2021-05-22 LAB — COMPREHENSIVE METABOLIC PANEL
ALT: 14 U/L (ref 0–44)
AST: 15 U/L (ref 15–41)
Albumin: 3.8 g/dL (ref 3.5–5.0)
Alkaline Phosphatase: 69 U/L (ref 38–126)
Anion gap: 7 (ref 5–15)
BUN: 47 mg/dL — ABNORMAL HIGH (ref 8–23)
CO2: 28 mmol/L (ref 22–32)
Calcium: 8.8 mg/dL — ABNORMAL LOW (ref 8.9–10.3)
Chloride: 104 mmol/L (ref 98–111)
Creatinine, Ser: 1.32 mg/dL — ABNORMAL HIGH (ref 0.44–1.00)
GFR, Estimated: 45 mL/min — ABNORMAL LOW (ref >60)
Glucose, Bld: 136 mg/dL — ABNORMAL HIGH (ref 70–99)
Potassium: 3.6 mmol/L (ref 3.5–5.1)
Sodium: 139 mmol/L (ref 135–145)
Total Bilirubin: 0.5 mg/dL (ref 0.3–1.2)
Total Protein: 6.8 g/dL (ref 6.5–8.1)

## 2021-05-22 LAB — RETIC PANEL
Immature Retic Fract: 16.1 % — ABNORMAL HIGH (ref 2.3–15.9)
RBC.: 3.42 MIL/uL — ABNORMAL LOW (ref 3.87–5.11)
Retic Count, Absolute: 90.6 10*3/uL (ref 19.0–186.0)
Retic Ct Pct: 2.7 % (ref 0.4–3.1)
Reticulocyte Hemoglobin: 31.1 pg (ref >27.9)

## 2021-05-22 LAB — LACTATE DEHYDROGENASE: LDH: 155 U/L (ref 98–192)

## 2021-05-22 LAB — VITAMIN B12: Vitamin B-12: 1120 pg/mL — ABNORMAL HIGH (ref 180–914)

## 2021-05-22 NOTE — Progress Notes (Signed)
Hematology oncology progress note   Clinic Day:  05/22/2021  Referring physician: Frazier Richards, MD  Chief Complaint: Tina Page is a 65 y.o. 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  Reticulocyte count was 11.3%.  Ferritin was 562.  Iron studies revealed a saturation of 20% and a TIBC of 214 (low).  B12 was 231 (low normal).  Folate was 42.   Peripheral smear revealed rouleaux formation.    Additional testing included the following + studies:  hepatitis C antibody, hepatitis B core antibody, CMV IgM, and EBV VCA (IgM and IgG).  Hepatitis B by PCR was negative.  Hepatitis C RNA was negative.  Mycoplasma pneumonia IgM was negative.  Reticulocyte count was 11.3% (high) indicating appropriate marrow response.  C3 and C4 were normal.  Cold agglutinin titer was negative x 2.  Negative studies included:  ANA, hepatitis B surface antigen, SPEP, and free light chain ratio.   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  MMA was initially 330 (normal).  Repeat MMA was elevated on 08/01/2016 confirming B12 deficiency.  Hepatitis B surface antibody was positive and hepatitis B surface antigen was negative on 08/06/2016.   07/07/2016  Chest, abdomen, and pelvis CT angiogram on revealed moderate diffuse atherosclerotic vascular disease of the abdominal aorta with severe stenosis of the left common iliac artery with suspected short segment occlusion. There was no adenopathy.  Spleen was normal.   04/15/2017 Abd US revealed a normal spleen and sludge in the gallbladder.  The liver was echogenic consistent fatty infiltration and/or  hepatocellular disease.  For hemolytic anemia treatments, in 2017, She has received multiple units of  warmed PRBCs to date.  08/01/2016-03/12/2017, steroid treatment with prednisone.  She is also on folic acid 1 mg daily. Borderline low vitamin B12 level on 07/09/2016.  Patient has been on oral vitamin B12 supplementation.  10/12/2017  positive warm autoantibody.  LDH and bilirubin were normal.   She began prednisone 10 mg on 12/07/2017 (discontinued 03/2018).  She received Rituxan weekly x 4 (last dose 06/16/2018).  Entecavir was discontinued on 07/06/2019.  06/12/2020 developed recurrent anemia.  Hematocrit was 27.6, hemoglobin 8.4, MCV 104.5, platelets 138,000, WBC 3,400 (ANC 2,300). Creatinine was 1.17 (CrCl 50 ml/min).  Positive warm autoantibody  Reticulocyte count was 7.1%.  LDH was 204 (98-192).  06/16/2020, began prednisone 1 mg/kilogram treatments, 07/24/2020, prednisone was tapered down to 20 mg.  She received Rituxan 1000 mg on day 1 and 15 (07/15/2020 and 07/29/2020).  She began entecavir on 06/28/2020.  Prednisone was tapered off.  She was on Septra for PCP prophylaxis which is now currently on hold    Other medical problems 07/10/2016.  She underwent PTCA and stent placement in right and left common iliac arteries and left external iliac artery. She is on Plavix.   03/19/2015 EGD revealed gastritis in the body and antrum.  Colonoscopy revealed one hyperplastic polyp.  Repeat EGD on 07/08/2016 was normal.  No evidence of bleeding.  History of she developed peri-oral and intranasal herpes simplex-1.  She was treated with valacyclovir and doxycycline on 10/19/2016.   10/26/2017. She developed flu like symptoms.  Symptoms included cough, myalgias, and fever (tmax unknown).  She was prescribed Mucinex, Tamiflu, and amoxicillin.  She only took amoxicillin x 5 days.  She developed increased liver function tests on 11/02/2017. CMV IgG was positive.  EBV VCA IgG, NA IgG, early antigen antibody  IgG were elevated.  EBV VCA IgM was < 36.  Testing was c/w a convalescence/past infection or reactivated infection.  LFTs normalized on 11/15/2017.  Smooth muscle antibody was 39 (high) on 11/10/2017 and 34 (high) on 11/30/2017.     Chest CT on 11/16/2017 revealed a 2.2 x 2.0 x 2.4 spiculated RIGHT middle lobe mass.  There was tiny nonspecific upper lobe pulmonary nodules greater on RIGHT, largest 3 mm, of uncertain etiology.  There was additional 8 x 8 mm opacity in the posterior sulcus of the RIGHT lower lobe. PET scan on 11/22/2017 revealed a 2 cm hypermetabolic right middle lobe lung mass (SUV 3.4) consistent  with primary lung neoplasm.  There were no findings for mediastinal/hilar lymphadenopathy or metastatic disease.  There were areas of hypermetabolism involving the left oblique abdominal muscles and the anorectal junction. PFTs on 11/18/2017 revealed an FEV1 of 1.22 liters (51%).  DLCO adj was 6.8 mL/mmHg/min (32%).   ENB done on 12/07/2017. Cytology was negative for malignancy. Pathology demonstrated organizing pneumonia and chronic bronchitis. Pathologist commented that organizing pneumonia spanned about 2 mm in one fragment. Cases of focal organizing pneumonia with hypermetabolism on FDG-PET have been described, but changes of this type can also be adjacent to a neoplasm. Patient prescribed a daily dose of Prednisone 10 mg   07/24/2020  repeat  chest CT on  revealed Lung-RADS 2, benign appearance or behavior. There was emphysema, aortic atherosclerosis, and coronary artery calcifications.  diarrhea.  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  She received azithromycin x 3 days.  She received ciprofloxacin x 10 days without improvement.  She has seen ID in [place].  She began Bactrim on 03/04/2018 (discontinued on 03/11/2018) secondary to increase in creatine.   Chronic kidney disease Urinalysis on 11/15/2017 revealed revealed no hemoglobin, bilirubin or  active sediment.   Liver function tests increased on 02/15/2019.  Work-up on 02/16/2019 revealed hepatitis B E antibody positive.  Negative studies included: hepatitis A total antibody, hepatitis A IgM, hepatitis B surface antigen, hepatitis B E antigen.  Hepatitis B DNA was not detected.  Folate was 80.5 and vitamin B12 was 443.  She has received her vaccinations:  She has completed the PCV13, PPSV23, and HiB.  She received Menvio (quadrivalent meningoccal vaccine) and Bexero (univalent serogroup B meningoccal vaccine) on 07/06/2019.  She was diagnosed with C difficile + diarrhea on 08/12/2020.  She completed a course of oral vancomycin.  Stool was negative for C. difficile on 08/27/2020.  11/17/2020 - 11/20/2020 [facility] admission with left L5 varicella zoster.  Lesion was positive for VZV. She was treated with ceftazidime and a valacyclovir.  She was discharged on 9 days of Valtrex 1 gm 3 times daily followed by 500 mg daily indefinitely for suppression and doxycycline.  She notes that she tested positive for COVID-19 on 08/21/2019.  She received the Moderna COVID-19 vaccine on 11/10/2019 and 12/08/2019.   History of discoid lupus  INTERVAL HISTORY VITALIA STOUGH is a 65 y.o. female who has above history reviewed by me today presents for follow up visit  Patient reports having shingle infection of her lip.  Patient reports that she had another recent shingle infection few months back..  Currently on valacyclovir, prednisone.  And gabapentin.  Prescribed by dermatology Dr. Phillip Heal. Denies any fever, chills, nausea vomiting diarrhea.   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   Elevated liver function tests 11/03/2017   GERD (gastroesophageal reflux disease)    Heme positive stool 01/29/2015    Hepatitis C 07/12/2016   HLD (hyperlipidemia)    Hypertension    Hypothyroidism    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/23/2017   Post PTCA 07/12/2016   Thrombocytopenia ([place]) 10/04/2016    Past Surgical History:  Procedure Laterality Date   ELECTROMAGNETIC NAVIGATION BROCHOSCOPY N/A 12/07/2017   Procedure: ELECTROMAGNETIC NAVIGATION BRONCHOSCOPY;  Surgeon: Flora Lipps, MD;  Location: [facility] ORS;  Service: Cardiopulmonary;  Laterality: N/A;   ESOPHAGOGASTRODUODENOSCOPY (EGD) WITH PROPOFOL N/A 07/08/2016   Procedure: ESOPHAGOGASTRODUODENOSCOPY (EGD) WITH PROPOFOL;  Surgeon: Manya Silvas, MD;  Location: [facility] ENDOSCOPY;  Service: Endoscopy;  Laterality: N/A;   KNEE SURGERY Right    repair of acl tear   PERIPHERAL VASCULAR CATHETERIZATION N/A 07/10/2016   Procedure: Lower Extremity Angiography;  Surgeon: Katha Cabal, MD;  Location: [place];  Service: Cardiovascular;  Laterality: N/A;   PERIPHERAL VASCULAR CATHETERIZATION N/A 07/10/2016   Procedure: Abdominal Aortogram w/Lower Extremity;  Surgeon: Katha Cabal, MD;  Location: [place] [facility];  Service: Cardiovascular;  Laterality: N/A;   PERIPHERAL VASCULAR CATHETERIZATION  07/10/2016   Procedure: Lower Extremity Intervention;  Surgeon: Katha Cabal, MD;  Location: [facility];  Service: Cardiovascular;;    Family History  Problem Relation Age of Onset   Diabetes Mother    Hypertension Mother    Diabetes Maternal Grandfather    Hypertension Maternal Grandfather    Breast cancer Neg Hx     Social History:  reports that she quit smoking about 4 years ago. Her smoking use included cigarettes. She has a 58.75 pack-year smoking history. She has never used smokeless tobacco. She reports that she does not drink alcohol and does not use drugs. She has a 21 pack year smoking history (1/2 pack/day from age [age]).  The patient works at [employer]  She is exposed to cold temperatures.  She  works 3rd shift.  She has a daughter, [name] and a daughter named [name].  She lives in [place]. The patient is alone today.  Allergies:  Allergies  Allergen Reactions   Ciprofloxacin Swelling    Facial swelling following single oral dose.    Codeine Anaphylaxis   Nsaids Other (See Comments)    PT HAS HEPC PT HAS HEPC Patient has Hep C.     Current Medications: Current Outpatient Medications  Medication Sig Dispense Refill   acetaminophen (TYLENOL) 650 MG CR tablet Take 650 mg by mouth every 8 (eight) hours as needed for pain.     albuterol (PROVENTIL HFA;VENTOLIN HFA) 108 (90 Base) MCG/ACT inhaler Inhale 2 puffs into the lungs every 6 (six) hours as needed for wheezing or shortness of breath.      allopurinol (ZYLOPRIM) 100 MG tablet TAKE 1 TABLET BY MOUTH EVERY DAY 30 tablet 5   clobetasol ointment (TEMOVATE) 0.05 % SMARTSIG:1 Topical Daily PRN     clopidogrel (PLAVIX) 75 MG tablet Take 1 tablet (75 mg total) by mouth daily. 30 tablet 5   cyanocobalamin 500 MCG tablet Take 500 mcg by mouth daily.  diclofenac Sodium (VOLTAREN) 1 % GEL Apply 2 g topically 4 (four) times daily. 841 g 2   folic acid (FOLVITE) 1 MG tablet TAKE 1 TABLET (1 MG TOTAL) BY MOUTH DAILY. 90 tablet 1   gabapentin (NEURONTIN) 100 MG capsule Take 1 capsule (100 mg total) by mouth 2 (two) times daily. 60 capsule 0   hydrochlorothiazide (HYDRODIURIL) 25 MG tablet Take 1 tablet by mouth daily. for high blood pressure     hydroxychloroquine (PLAQUENIL) 200 MG tablet Take 200 mg by mouth 2 (two) times daily.     hydroxychloroquine (PLAQUENIL) 200 MG tablet Take 1 tablet by mouth 2 (two) times daily.     levothyroxine (SYNTHROID, LEVOTHROID) 75 MCG tablet Take 75 mcg by mouth daily before breakfast.      lisinopril (ZESTRIL) 20 MG tablet Take 20 mg by mouth daily.     omeprazole (PRILOSEC) 20 MG capsule Take 1 capsule (20 mg total) by mouth daily. 60 capsule 0   pravastatin (PRAVACHOL) 40 MG tablet Take 40 mg by  mouth daily.     predniSONE (DELTASONE) 10 MG tablet Take by mouth.     entecavir (BARACLUDE) 0.5 MG tablet Take 0.5 mg by mouth every other day. (Patient not taking: Reported on 05/22/2021)     Potassium Chloride ER 20 MEQ TBCR TAKE 1 TABLET BY MOUTH ONCE DAILY FOR LOW POTASSIUM (Patient not taking: No sig reported)     sulfamethoxazole-trimethoprim (BACTRIM DS) 800-160 MG tablet indefinite prophylactic dose (Patient not taking: No sig reported)     triamcinolone (KENALOG) 0.025 % cream Apply topically 2 (two) times daily. (Patient not taking: Reported on 05/22/2021)     No current facility-administered medications for this visit.    Review of Systems  Constitutional:  Negative for chills, diaphoresis, fever and malaise/fatigue.       Feels "ok."  HENT:  Negative for congestion, ear discharge, ear pain, hearing loss, nosebleeds, sinus pain, sore throat and tinnitus.   Eyes: Negative.  Negative for blurred vision, double vision, photophobia and pain.  Respiratory: Negative.  Negative for cough, hemoptysis, sputum production and shortness of breath.   Cardiovascular: Negative.  Negative for chest pain, palpitations and leg swelling.  Gastrointestinal:  Positive for diarrhea (watery, x 1 week). Negative for abdominal pain, blood in stool, constipation, heartburn, melena, nausea and vomiting.  Genitourinary: Negative.  Negative for dysuria, frequency, hematuria and urgency.  Musculoskeletal: Negative.  Negative for back pain, joint pain, myalgias and neck pain.  Skin:  Positive for rash. Negative for itching.       Single on her face.  Neurological:  Negative for dizziness, tingling, sensory change, weakness and headaches.  Endo/Heme/Allergies:  Negative for environmental allergies. Does not bruise/bleed easily.  Psychiatric/Behavioral:  Negative for depression and memory loss. The patient is not nervous/anxious and does not have insomnia.   All other systems reviewed and are  negative. Performance status (ECOG): 1  Vital Signs Blood pressure (!) 133/56, pulse 80, temperature (!) 97.4 F (36.3 C), resp. rate 16, weight 173 lb 4.5 oz (78.6 kg), SpO2 99 %.  Physical Exam Vitals and nursing note reviewed.  Constitutional:      General: She is not in acute distress.    Appearance: She is not diaphoretic.  HENT:     Head: Normocephalic and atraumatic.     Mouth/Throat:     Mouth: Mucous membranes are moist.     Pharynx: Oropharynx is clear.  Eyes:     General: No scleral icterus.  Extraocular Movements: Extraocular movements intact.     Conjunctiva/sclera: Conjunctivae normal.     Pupils: Pupils are equal, round, and reactive to light.  Cardiovascular:     Rate and Rhythm: Normal rate and regular rhythm.     Heart sounds: Normal heart sounds. No murmur heard. Pulmonary:     Effort: Pulmonary effort is normal. No respiratory distress.     Breath sounds: Normal breath sounds. No wheezing or rales.  Chest:     Chest wall: No tenderness.  Abdominal:     General: Bowel sounds are normal. There is no distension.     Palpations: Abdomen is soft. There is no mass.     Tenderness: There is no abdominal tenderness. There is no guarding or rebound.  Musculoskeletal:        General: No swelling or tenderness. Normal range of motion.     Cervical back: Normal range of motion and neck supple.  Lymphadenopathy:     Head:     Right side of head: No preauricular, posterior auricular or occipital adenopathy.     Left side of head: No preauricular, posterior auricular or occipital adenopathy.     Cervical: No cervical adenopathy.     Upper Body:     Right upper body: No supraclavicular or axillary adenopathy.     Left upper body: No supraclavicular or axillary adenopathy.     Lower Body: No right inguinal adenopathy. No left inguinal adenopathy.  Skin:    General: Skin is warm and dry.     Findings: Rash (facial shingle raash upper and lower lip) present.   Neurological:     Mental Status: She is alert and oriented to person, place, and time.  Psychiatric:        Mood and Affect: Mood normal.   Laboratory findings CBC    Component Value Date/Time   WBC 6.7 05/22/2021 1250   RBC 3.42 (L) 05/22/2021 1250   RBC 3.45 (L) 05/22/2021 1250   HGB 10.7 (L) 05/22/2021 1250   HGB 9.0 (L) 02/18/2018 1204   HCT 31.2 (L) 05/22/2021 1250   HCT 46.5 06/11/2014 1732   PLT 164 05/22/2021 1250   PLT 127 (L) 06/11/2014 1732   MCV 90.4 05/22/2021 1250   MCV 92 06/11/2014 1732   MCH 31.0 05/22/2021 1250   MCHC 34.3 05/22/2021 1250   RDW 15.7 (H) 05/22/2021 1250   RDW 14.5 06/11/2014 1732   LYMPHSABS 0.5 (L) 05/22/2021 1250   MONOABS 0.2 05/22/2021 1250   EOSABS 0.0 05/22/2021 1250   BASOSABS 0.0 05/22/2021 1250   CMP Latest Ref Rng & Units 05/22/2021 02/13/2021 01/08/2021  Glucose 70 - 99 mg/dL 136(H) 103(H) 104(H)  BUN 8 - 23 mg/dL 47(H) 30(H) 18  Creatinine 0.44 - 1.00 mg/dL 1.32(H) 1.21(H) 0.86  Sodium 135 - 145 mmol/L 139 141 140  Potassium 3.5 - 5.1 mmol/L 3.6 4.1 3.9  Chloride 98 - 111 mmol/L 104 107 106  CO2 22 - 32 mmol/L 28 29 29   Calcium 8.9 - 10.3 mg/dL 8.8(L) 9.3 9.2  Total Protein 6.5 - 8.1 g/dL 6.8 7.0 7.0  Total Bilirubin 0.3 - 1.2 mg/dL 0.5 0.4 0.6  Alkaline Phos 38 - 126 U/L 69 74 72  AST 15 - 41 U/L 15 17 16   ALT 0 - 44 U/L 14 12 13      Assessment and plan   1. Warm antibody hemolytic anemia (HCC)   2. Hepatitis B core antibody positive   3. B12 deficiency  4. Herpes zoster without complication   5. Nodule of middle lobe of right lung    #History of recurrent warm antibody hemolytic anemia, Labs reviewed and discussed with patient.  Hemoglobin has decreased to 10.7 No hyperbilirubinemia, LDH 155. Probably secondary to Current shingle infection.  #History of vitamin B12 deficiency. Continue oral vitamin B12 500 MCG daily.  #Positive hepatitis B core antibody She started entecavir 0.5 mg daily on 06/28/2020. Dose  adjusted based on renal function. CrCl 30-49 ml/min   50% dosing (0.5 mg QOD) CrCl >= 50 ml/min 100% dosing (0.5 mg a day). With her kidney function currently QOD  # Right middle lobe nodule, continue to follow pulmonology CT findings felt consistent with vaccine and waning process, probably atypical infectious process.  # Left L5 varicella zoster, treated with valacyclovir. Per Dr Gwenevere Ghazi note on 11/28/2020, she will not need prophylactic valacyclovir. Recurrent varicella-zoster Currently on steroid, Valacyclovir. Given that patient has recurrent disease,Will consider chronic valacyclovir suppression.   # PCP prophylaxis Patient was on Septra DS on Mondays, Wednesdays, and Fridays, which was held due to fluctuated kidney functions.   RTC  6 weeks repeat CBC CMP 3 months-lab MD, CBC CMP reticulocyte panel, B12, LDH  I discussed the assessment and treatment plan with the patient.  The patient was provided an opportunity to ask questions and all were answered.  The patient agreed with the plan and demonstrated an understanding of the instructions.  The patient was advised to call back or seek an in person evaluation if the symptoms worsen or if the condition fails to improve as anticipated.   Earlie Server, MD, PhD Hematology Oncology [place] [facility] 05/22/2021

## 2021-05-22 NOTE — Progress Notes (Signed)
Pt has been dx with shingles currently has blisters on her lips.

## 2021-06-03 ENCOUNTER — Telehealth: Payer: Self-pay | Admitting: *Deleted

## 2021-06-03 DIAGNOSIS — D5911 Warm autoimmune hemolytic anemia: Secondary | ICD-10-CM

## 2021-06-03 NOTE — Telephone Encounter (Signed)
Patient called reporting that she feels dehydrated and that she thinks she needs her labs checked that her kidney functions are down, because they were down at last check. Please advise

## 2021-06-04 ENCOUNTER — Other Ambulatory Visit: Payer: Self-pay

## 2021-06-04 ENCOUNTER — Inpatient Hospital Stay: Payer: BC Managed Care – PPO | Attending: Oncology

## 2021-06-04 ENCOUNTER — Encounter: Payer: Self-pay | Admitting: Hematology and Oncology

## 2021-06-04 DIAGNOSIS — D5911 Warm autoimmune hemolytic anemia: Secondary | ICD-10-CM | POA: Insufficient documentation

## 2021-06-04 LAB — BASIC METABOLIC PANEL
Anion gap: 7 (ref 5–15)
BUN: 33 mg/dL — ABNORMAL HIGH (ref 8–23)
CO2: 30 mmol/L (ref 22–32)
Calcium: 8.6 mg/dL — ABNORMAL LOW (ref 8.9–10.3)
Chloride: 97 mmol/L — ABNORMAL LOW (ref 98–111)
Creatinine, Ser: 1.46 mg/dL — ABNORMAL HIGH (ref 0.44–1.00)
GFR, Estimated: 40 mL/min — ABNORMAL LOW (ref >60)
Glucose, Bld: 83 mg/dL (ref 70–99)
Potassium: 4.1 mmol/L (ref 3.5–5.1)
Sodium: 134 mmol/L — ABNORMAL LOW (ref 135–145)

## 2021-06-04 NOTE — Telephone Encounter (Signed)
Patient agrees to appointment today at 115. Jearld Lesch in lab notified of add on apptand need for BMP only

## 2021-06-04 NOTE — Telephone Encounter (Signed)
Tina Page, please contact pt to schedule her for labs.

## 2021-06-23 ENCOUNTER — Encounter: Payer: Self-pay | Admitting: Hematology and Oncology

## 2021-06-26 ENCOUNTER — Other Ambulatory Visit: Payer: Self-pay

## 2021-06-26 DIAGNOSIS — Z860100 Personal history of colon polyps, unspecified: Secondary | ICD-10-CM

## 2021-06-26 MED ORDER — NA SULFATE-K SULFATE-MG SULF 17.5-3.13-1.6 GM/177ML PO SOLN: 1.0000 | Freq: Once | ORAL | 0 refills | Status: AC

## 2021-06-26 NOTE — Progress Notes (Signed)
Gastroenterology Pre-Procedure Review  Request Date: 07/10/21 Requesting Physician: Dr. Allen Norris  PATIENT REVIEW QUESTIONS: The patient responded to the following health history questions as indicated:    1. Are you having any GI issues?  Some constipation, pt thinks she may have a hemorrhoids. Declined office visit. 2. Do you have a personal history of Polyps? yes (2017 polyps were removed.) 3. Do you have a family history of Colon Cancer or Polyps? no 4. Diabetes Mellitus? no 5. Joint replacements in the past 12 months?no 6. Major health problems in the past 3 months?no 7. Any artificial heart valves, MVP, or defibrillator?no    MEDICATIONS & ALLERGIES:    Patient reports the following regarding taking any anticoagulation/antiplatelet therapy:   Plavix, Coumadin, Eliquis, Xarelto, Lovenox, Pradaxa, Brilinta, or Effient? yes (Plavix 75 mg) Aspirin? no  Patient confirms/reports the following medications:  Current Outpatient Medications  Medication Sig Dispense Refill   acetaminophen (TYLENOL) 650 MG CR tablet Take 650 mg by mouth every 8 (eight) hours as needed for pain.     albuterol (PROVENTIL HFA;VENTOLIN HFA) 108 (90 Base) MCG/ACT inhaler Inhale 2 puffs into the lungs every 6 (six) hours as needed for wheezing or shortness of breath.      allopurinol (ZYLOPRIM) 100 MG tablet TAKE 1 TABLET BY MOUTH EVERY DAY 30 tablet 5   clobetasol ointment (TEMOVATE) 0.05 % SMARTSIG:1 Topical Daily PRN     clopidogrel (PLAVIX) 75 MG tablet Take 1 tablet (75 mg total) by mouth daily. 30 tablet 5   cyanocobalamin 500 MCG tablet Take 500 mcg by mouth daily.     diclofenac Sodium (VOLTAREN) 1 % GEL Apply 2 g topically 4 (four) times daily. 100 g 2   entecavir (BARACLUDE) 0.5 MG tablet Take 0.5 mg by mouth every other day. (Patient not taking: Reported on 09/05/2692)     folic acid (FOLVITE) 1 MG tablet TAKE 1 TABLET (1 MG TOTAL) BY MOUTH DAILY. 90 tablet 1   gabapentin (NEURONTIN) 100 MG capsule Take 1  capsule (100 mg total) by mouth 2 (two) times daily. 60 capsule 0   hydrochlorothiazide (HYDRODIURIL) 25 MG tablet Take 1 tablet by mouth daily. for high blood pressure     hydroxychloroquine (PLAQUENIL) 200 MG tablet Take 200 mg by mouth 2 (two) times daily.     hydroxychloroquine (PLAQUENIL) 200 MG tablet Take 1 tablet by mouth 2 (two) times daily.     levothyroxine (SYNTHROID, LEVOTHROID) 75 MCG tablet Take 75 mcg by mouth daily before breakfast.      lisinopril (ZESTRIL) 20 MG tablet Take 20 mg by mouth daily.     omeprazole (PRILOSEC) 20 MG capsule Take 1 capsule (20 mg total) by mouth daily. 60 capsule 0   Potassium Chloride ER 20 MEQ TBCR TAKE 1 TABLET BY MOUTH ONCE DAILY FOR LOW POTASSIUM (Patient not taking: No sig reported)     pravastatin (PRAVACHOL) 40 MG tablet Take 40 mg by mouth daily.     predniSONE (DELTASONE) 10 MG tablet Take by mouth.     sulfamethoxazole-trimethoprim (BACTRIM DS) 800-160 MG tablet indefinite prophylactic dose (Patient not taking: No sig reported)     triamcinolone (KENALOG) 0.025 % cream Apply topically 2 (two) times daily. (Patient not taking: Reported on 05/22/2021)     No current facility-administered medications for this visit.    Patient confirms/reports the following allergies:  Allergies  Allergen Reactions   Ciprofloxacin Swelling    Facial swelling following single oral dose.    Codeine Anaphylaxis  Nsaids Other (See Comments)    PT HAS HEPC PT HAS HEPC Patient has Hep C.     No orders of the defined types were placed in this encounter.   AUTHORIZATION INFORMATION Primary Insurance: 1D#: Group #:  Secondary Insurance: 1D#: Group #:  SCHEDULE INFORMATION: Date: 07/10/21 Time: Location: [place]

## 2021-06-30 ENCOUNTER — Encounter: Payer: Self-pay | Admitting: Gastroenterology

## 2021-07-01 ENCOUNTER — Encounter: Payer: Self-pay | Admitting: Anesthesiology

## 2021-07-02 ENCOUNTER — Ambulatory Visit
Admit: 2021-07-02 | Discharge: 2021-07-03 | Payer: PRIVATE HEALTH INSURANCE | Attending: Student in an Organized Health Care Education/Training Program | Primary: Student in an Organized Health Care Education/Training Program

## 2021-07-02 DIAGNOSIS — B181 Chronic viral hepatitis B without delta-agent: Principal | ICD-10-CM

## 2021-07-02 DIAGNOSIS — Z7185 Vaccine counseling: Principal | ICD-10-CM

## 2021-07-03 ENCOUNTER — Inpatient Hospital Stay: Payer: BC Managed Care – PPO | Attending: Oncology

## 2021-07-03 ENCOUNTER — Telehealth: Payer: Self-pay | Admitting: Gastroenterology

## 2021-07-03 ENCOUNTER — Telehealth: Payer: Self-pay | Admitting: Cardiovascular Disease

## 2021-07-03 ENCOUNTER — Other Ambulatory Visit: Payer: Self-pay

## 2021-07-03 ENCOUNTER — Telehealth: Payer: Self-pay

## 2021-07-03 DIAGNOSIS — D5911 Warm autoimmune hemolytic anemia: Secondary | ICD-10-CM | POA: Insufficient documentation

## 2021-07-03 DIAGNOSIS — R918 Other nonspecific abnormal finding of lung field: Secondary | ICD-10-CM | POA: Diagnosis not present

## 2021-07-03 DIAGNOSIS — E538 Deficiency of other specified B group vitamins: Secondary | ICD-10-CM | POA: Diagnosis present

## 2021-07-03 LAB — COMPREHENSIVE METABOLIC PANEL
ALT: 11 U/L (ref 0–44)
AST: 14 U/L — ABNORMAL LOW (ref 15–41)
Albumin: 4.2 g/dL (ref 3.5–5.0)
Alkaline Phosphatase: 71 U/L (ref 38–126)
Anion gap: 8 (ref 5–15)
BUN: 30 mg/dL — ABNORMAL HIGH (ref 8–23)
CO2: 30 mmol/L (ref 22–32)
Calcium: 8.9 mg/dL (ref 8.9–10.3)
Chloride: 101 mmol/L (ref 98–111)
Creatinine, Ser: 1.28 mg/dL — ABNORMAL HIGH (ref 0.44–1.00)
GFR, Estimated: 47 mL/min — ABNORMAL LOW (ref >60)
Glucose, Bld: 88 mg/dL (ref 70–99)
Potassium: 3.8 mmol/L (ref 3.5–5.1)
Sodium: 139 mmol/L (ref 135–145)
Total Bilirubin: 0.6 mg/dL (ref 0.3–1.2)
Total Protein: 7.6 g/dL (ref 6.5–8.1)

## 2021-07-03 LAB — CBC WITH DIFFERENTIAL/PLATELET
Abs Immature Granulocytes: 0.02 10*3/uL (ref 0.00–0.07)
Basophils Absolute: 0.1 10*3/uL (ref 0.0–0.1)
Basophils Relative: 1 %
Eosinophils Absolute: 0.2 10*3/uL (ref 0.0–0.5)
Eosinophils Relative: 3 %
HCT: 33.4 % — ABNORMAL LOW (ref 36.0–46.0)
Hemoglobin: 10.6 g/dL — ABNORMAL LOW (ref 12.0–15.0)
Immature Granulocytes: 0 %
Lymphocytes Relative: 19 %
Lymphs Abs: 1 10*3/uL (ref 0.7–4.0)
MCH: 30.3 pg (ref 26.0–34.0)
MCHC: 31.7 g/dL (ref 30.0–36.0)
MCV: 95.4 fL (ref 80.0–100.0)
Monocytes Absolute: 0.6 10*3/uL (ref 0.1–1.0)
Monocytes Relative: 10 %
Neutro Abs: 3.8 10*3/uL (ref 1.7–7.7)
Neutrophils Relative %: 67 %
Platelets: 141 10*3/uL — ABNORMAL LOW (ref 150–400)
RBC: 3.5 MIL/uL — ABNORMAL LOW (ref 3.87–5.11)
RDW: 15.2 % (ref 11.5–15.5)
WBC: 5.6 10*3/uL (ref 4.0–10.5)
nRBC: 0 % (ref 0.0–0.2)

## 2021-07-03 NOTE — Telephone Encounter (Signed)
[facility] ([place])  Requesting left message on voicemail about cardiac clearence for this patient. Call back number is [phone]

## 2021-07-03 NOTE — Telephone Encounter (Signed)
Patient has been made aware of Dr. Rockey Situ recommendation. He would like for her to have a follow visit at his office. Pt verbalized understanding.

## 2021-07-03 NOTE — Telephone Encounter (Signed)
   Name: Tina Page  DOB: 23-Apr-1956  MRN: [id number]  Primary Cardiologist: Ida Rogue, MD  Chart reviewed as part of pre-operative protocol coverage. Because of Tina Page past medical history and time since last visit, she will require a follow-up visit in order to better assess preoperative cardiovascular risk.  Also, pt is on plavix for PAD managed by [facility]. Please reach out to them for plavix hold.   Pre-op covering staff: - Please schedule appointment and call patient to inform them. If patient already had an upcoming appointment within acceptable timeframe, please add "pre-op clearance" to the appointment notes so provider is aware. - Please contact requesting surgeon's office via preferred method (i.e, phone, fax) to inform them of need for appointment prior to surgery.  If applicable, this message will also be routed to pharmacy pool and/or primary cardiologist for input on holding anticoagulant/antiplatelet agent as requested below so that this information is available to the clearing provider at time of patient's appointment.   Tami Lin Shervin Cypert, PA  07/03/2021, 12:09 PM

## 2021-07-03 NOTE — Telephone Encounter (Signed)
Made another attempt to reach [facility]. Was able to leave a detailed voicemail letting them know that the patient needs an appointment as well as that they need to contact [facility] regarding the request to hold plavix.

## 2021-07-03 NOTE — Telephone Encounter (Signed)
   [facility] Pre-operative Risk Assessment    Patient Name: TYNIKA LUDDY  DOB: [birth year] MRN: [id number]  HEARTCARE STAFF:  - IMPORTANT!!!!!! Under Visit Info/Reason for Call, type in Other and utilize the format Clearance MM/DD/YY or Clearance TBD. Do not use dashes or single digits. - Please review there is not already an duplicate clearance open for this procedure. - If request is for dental extraction, please clarify the # of teeth to be extracted. - If the patient is currently at the dentist's office, call Pre-Op Callback Staff (MA/nurse) to input urgent request.  - If the patient is not currently in the dentist office, please route to the Pre-Op pool.  Request for surgical clearance:  What type of surgery is being performed? Colonoscopy  When is this surgery scheduled? 07/10/21  What type of clearance is required (medical clearance vs. Pharmacy clearance to hold med vs. Both)? both  Are there any medications that need to be held prior to surgery and how long? Plavix 5day hold  Practice name and name of physician performing surgery? [facility], [facility]  What is the office phone number? [phone]   7.   What is the office fax number? [phone]  8.   Anesthesia type (None, local, MAC, general) ? Not listed   Pilar A Ham 07/03/2021, 10:50 AM  _________________________________________________________________   (provider comments below)

## 2021-07-03 NOTE — Telephone Encounter (Signed)
Called [facility] to make them aware of the need to reach out to [facility] regarding plavix hold, but received a msg that office was closed. Will try again later.

## 2021-07-04 ENCOUNTER — Encounter: Payer: Self-pay | Admitting: Family Medicine

## 2021-07-04 NOTE — Telephone Encounter (Signed)
See notes from [facility] who left a detailed message in regard to clearance request. See notes. I will forward notes to [facility] for upcoming appt. Will send FYI to requesting office to see notes.

## 2021-07-04 NOTE — Telephone Encounter (Signed)
Patient has been informed of Dr. Rockey Situ recommendations. She will contact them to set up an appointment.

## 2021-07-09 NOTE — Progress Notes (Signed)
Cardiology Office Note:    Date:  07/10/2021   ID:  Tina Page, DOB September 26, 1955, MRN [id number]  PCP:  Frazier Richards, MD  [facility] Cardiologist:  Ida Rogue, MD  [facility] Electrophysiologist:  None   Referring MD: Frazier Richards, MD   Chief Complaint: pre-op clearance  History of Present Illness:    Tina Page is a 65 y.o. female with a hx of COPD, HLD, PAD s/p PTCA, HTN, hemolytic anemia, aortic atherosclerosis, s/p bilateral iliac artery stents using the kidding balloon technique 07/2016,  coronary artery calcification on CT, CKD stage 3 presents today for pre-op clearance.   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 She additionally follows with pulmonary for lung nodules. Were found to be non-cancerous on bronchoscopy. Follows with Dr. Holley Raring of nephrology.    CT with moderate aortic atherosclerosis particularly in arch and at least moderate coronary calcification in distal LM or bifurcation, proximal LAD, and RCA by Dr. Donivan Scull review.   The patient follows with vascular for lower extremity atherosclerosis with intermittent claudication.   Today, the patient presents for pre-op clearance for colonoscopy. This was rescheduled. Has had shingles three times in the last year. Changes with anemia medications. No chest pain, shortness oif breath. Intermittent lower leg edema, she works full-time. On her feet all day. No orthopnea, pnd, palpitations. She is on plavix for PAD stents, she is to hold Plavix a week before the procedure. Heart rate is upper 103bpm. She had flu and covid vaccine yesterday. BP good. EKG is unchanged.  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/23/2017   Post PTCA 07/12/2016   Thrombocytopenia ([place]) 10/04/2016   Wears dentures    full upper and lower    Past Surgical History:  Procedure Laterality Date   ELECTROMAGNETIC NAVIGATION BROCHOSCOPY N/A 12/07/2017   Procedure: ELECTROMAGNETIC NAVIGATION BRONCHOSCOPY;  Surgeon: Flora Lipps, MD;  Location: [facility] ORS;  Service: Cardiopulmonary;  Laterality: N/A;   ESOPHAGOGASTRODUODENOSCOPY (EGD) WITH PROPOFOL N/A 07/08/2016   Procedure: ESOPHAGOGASTRODUODENOSCOPY (EGD) WITH PROPOFOL;  Surgeon: Manya Silvas, MD;  Location: [facility] ENDOSCOPY;  Service: Endoscopy;  Laterality: N/A;   KNEE SURGERY Right    repair of acl tear   PERIPHERAL VASCULAR CATHETERIZATION N/A 07/10/2016   Procedure: Lower Extremity Angiography;  Surgeon: Katha Cabal, MD;  Location: [place] [facility];  Service: Cardiovascular;  Laterality: N/A;   PERIPHERAL VASCULAR CATHETERIZATION N/A 07/10/2016   Procedure: Abdominal Aortogram w/Lower Extremity;  Surgeon: Katha Cabal, MD;  Location: [place] [facility];  Service: Cardiovascular;  Laterality: N/A;   PERIPHERAL VASCULAR CATHETERIZATION  07/10/2016   Procedure: Lower Extremity Intervention;  Surgeon: Katha Cabal, MD;  Location: [place] [facility];  Service: Cardiovascular;;    Current Medications: Current Meds  Medication Sig   albuterol (PROVENTIL HFA;VENTOLIN HFA) 108 (90 Base) MCG/ACT inhaler Inhale 2 puffs into the lungs every 6 (six) hours as needed for wheezing or shortness of breath.    allopurinol (ZYLOPRIM) 100 MG tablet TAKE  1 TABLET BY MOUTH EVERY DAY   clopidogrel (PLAVIX) 75 MG tablet Take 1 tablet (75 mg total) by mouth daily.   cyanocobalamin 500 MCG tablet Take 500 mcg by mouth daily.   entecavir (BARACLUDE) 0.5 MG tablet Take 0.5 mg by  mouth every other day.   folic acid (FOLVITE) 1 MG tablet TAKE 1 TABLET (1 MG TOTAL) BY MOUTH DAILY.   hydrochlorothiazide (HYDRODIURIL) 25 MG tablet Take 1 tablet by mouth daily. for high blood pressure   levothyroxine (SYNTHROID, LEVOTHROID) 75 MCG tablet Take 75 mcg by mouth daily before breakfast.    lisinopril (ZESTRIL) 20 MG tablet Take 20 mg by mouth daily.   omeprazole (PRILOSEC) 20 MG capsule Take 1 capsule (20 mg total) by mouth daily.   Potassium Chloride ER 20 MEQ TBCR TAKE 1 TABLET BY MOUTH ONCE DAILY FOR LOW POTASSIUM   pravastatin (PRAVACHOL) 40 MG tablet Take 40 mg by mouth daily.     Allergies:   Ciprofloxacin, Codeine, and Nsaids   Social History   Socioeconomic History   Marital status: Widowed    Spouse name: Not on file   Number of children: Not on file   Years of education: Not on file   Highest education level: Not on file  Occupational History   Not on file  Tobacco Use   Smoking status: Former    Packs/day: 1.25    Years: 47.00    Pack years: 58.75    Types: Cigarettes    Quit date: 07/11/2016    Years since quitting: 5.0   Smokeless tobacco: Never  Vaping Use   Vaping Use: Never used  Substance and Sexual Activity   Alcohol use: No   Drug use: No   Sexual activity: Not Currently    Birth control/protection: None  Other Topics Concern   Not on file  Social History Narrative   Not on file   Social Determinants of Health   Financial Resource Strain: Not on file  Food Insecurity: Not on file  Transportation Needs: Not on file  Physical Activity: Not on file  Stress: Not on file  Social Connections: Not on file     Family History: The patient's family history includes Diabetes in her maternal grandfather and mother; Hypertension in her maternal grandfather and mother. There is no history of Breast cancer.  ROS:   Please see the history of present illness.     All other systems reviewed and are negative.  EKGs/Labs/Other Studies  Reviewed:    The following studies were reviewed today:  MPI 2019 Narrative & Impression  Pharmacological myocardial perfusion imaging study with no significant  Ischemia GI uptake artifact noted Normal wall motion, EF estimated at 77% No EKG changes concerning for ischemia at peak stress or in recovery. Low risk scan     Signed, Esmond Plants, MD, Ph.D [facility]     EKG:  EKG is  ordered today.  The ekg ordered today demonstrates NSR, 103bpm, LAD, nonspecific T wave changes  Recent Labs: 07/18/2020: Magnesium 1.9 11/17/2020: TSH 4.201 07/03/2021: ALT 11; BUN 30; Creatinine, Ser 1.28; Hemoglobin 10.6; Platelets 141; Potassium 3.8; Sodium 139  Recent Lipid Panel    Component Value Date/Time   CHOL 168 10/11/2019 1148   TRIG 160 (H) 10/11/2019 1148   HDL 36 (L) 10/11/2019 1148   CHOLHDL 4.7 (H) 10/11/2019 1148   LDLCALC 104 (H) 10/11/2019 1148   LDLDIRECT 102 (H) 10/11/2019 1148    Physical Exam:    VS:  BP 128/70 (BP Location: Left Arm, Patient Position: Sitting, Cuff Size: Normal)   Pulse (!) 103   Ht 5\' 3"  (1.6 m)   Wt 169 lb 4 oz (76.8 kg)   SpO2 98%   BMI 29.98 kg/m     Wt Readings from Last 3 Encounters:  07/10/21 169 lb 4 oz (76.8 kg)  05/22/21 173 lb 4.5 oz (78.6 kg)  02/13/21 172 lb 13.5 oz (78.4 kg)     GEN:  Well nourished, well developed in no acute distress HEENT: Normal NECK: No JVD; + carotid bruits LYMPHATICS: No lymphadenopathy CARDIAC: sinus tachycardia,RR, no murmurs, rubs, gallops RESPIRATORY:  Clear to auscultation without rales, wheezing or rhonchi  ABDOMEN: Soft, non-tender, non-distended MUSCULOSKELETAL:  No edema; No deformity  SKIN: Warm and dry NEUROLOGIC:  Alert and oriented x 3 PSYCHIATRIC:  Normal affect   ASSESSMENT:    1. PAD (peripheral artery disease) ([place])   2. Coronary artery calcification seen on CT scan   3. Pre-op evaluation   4. Bruit    PLAN:    In order of problems listed above:  Pre-op  clearance Patient presents for pre-op for routine colonoscopy. She has h/o coronary artery calcifications, PAD, HTN. Denies chest pain, SOB, orthopnea, pnd. Has intermittent dependent edema. She works full-time and is on her feet all day, no anginal symptoms with ambulation. Last ischemic eval was 2019 with MPI whta twas low risk, normal LVEF and no ischemia. She is on Plavix for iliac stents, she is to hold this a week prior to procedure. BP good on HCTZ and lisinopril. Needs updated lipid profile. Heart rate elevated to 103bpm, unsure as to why, maybe that she had Flu and Covid vaccine yesterday. She does not monitor heart rate at home. No leg pain, recent labs unremarkable. Will check TSH and Mag. Prior EKG shows HR in the 70s. She will monitor heart rate at home and notify us in 1-2 weeks. Bruits noted on exam, will get b/l US carotids. We will see her back in 1 month. Would wait on colonoscopy until follow-up. METS>4. According to revised cardiac index patient is class 1 risk at 3.9% for 30 day risk of death, MI, or cardiac arrest.   Coronary artery calcifications Denies anginal symptoms as above. She is on Plavix for iliac stents. MPI in 2019 was low risk, EF 77%, no significant ischemia. Continue statin, plavix. Can consider BB in the future if needed.   PAD Followed by [facility]. Continue Plavix and statin.   B/l bruits Bruits on exam, will get US carotids  Sinus tachycardua Recent flu and covid vaccine yesterday. Will check TSH and Mag as above.    Disposition: Follow up in 1 month(s) with MD/APP     Signed, Jomel Whittlesey Ninfa Meeker, PA-C  07/10/2021 9:11 AM    [facility]

## 2021-07-10 ENCOUNTER — Ambulatory Visit: Payer: BC Managed Care – PPO | Admitting: Medical

## 2021-07-10 ENCOUNTER — Ambulatory Visit
Admission: RE | Admit: 2021-07-10 | Payer: BC Managed Care – PPO | Source: Home / Self Care | Admitting: Gastroenterology

## 2021-07-10 ENCOUNTER — Encounter: Payer: Self-pay | Admitting: Medical

## 2021-07-10 ENCOUNTER — Other Ambulatory Visit: Payer: Self-pay

## 2021-07-10 VITALS — BP 128/70 | HR 103 | Ht 63.0 in | Wt 169.2 lb

## 2021-07-10 DIAGNOSIS — Z01810 Encounter for preprocedural cardiovascular examination: Secondary | ICD-10-CM

## 2021-07-10 DIAGNOSIS — I739 Peripheral vascular disease, unspecified: Secondary | ICD-10-CM

## 2021-07-10 DIAGNOSIS — R0989 Other specified symptoms and signs involving the circulatory and respiratory systems: Secondary | ICD-10-CM | POA: Diagnosis not present

## 2021-07-10 DIAGNOSIS — Z01818 Encounter for other preprocedural examination: Secondary | ICD-10-CM

## 2021-07-10 DIAGNOSIS — I2510 Atherosclerotic heart disease of native coronary artery without angina pectoris: Secondary | ICD-10-CM

## 2021-07-10 HISTORY — DX: Presence of dental prosthetic device (complete) (partial): Z97.2

## 2021-07-10 SURGERY — COLONOSCOPY WITH PROPOFOL
Anesthesia: Choice

## 2021-07-10 NOTE — Patient Instructions (Signed)
Medication Instructions:  No changes at this time.  *If you need a refill on your cardiac medications before your next appointment, please call your pharmacy*   Lab Work: TSH & Mag today  If you have labs (blood work) drawn today and your tests are completely normal, you will receive your results only by: [place] (if you have MyChart) OR A paper copy in the mail If you have any lab test that is abnormal or we need to change your treatment, we will call you to review the results.   Testing/Procedures: Your physician has requested that you have a carotid duplex.   This test is an ultrasound of the carotid arteries in your neck. It looks at blood flow through these arteries that supply the brain with blood. Allow one hour for this exam. There are no restrictions or special instructions.    Follow-Up: At [facility], you and your health needs are our priority.  As part of our continuing mission to provide you with exceptional heart care, we have created designated Provider Care Teams.  These Care Teams include your primary Cardiologist (physician) and Advanced Practice Providers (APPs -  Physician Assistants and Nurse Practitioners) who all work together to provide you with the care you need, when you need it.   Your next appointment:   1 month(s)  The format for your next appointment:   In Person  Provider:   Ida Rogue, MD or Cadence Kathlen Mody, [place]

## 2021-07-11 LAB — MAGNESIUM: Magnesium: 1.6 mg/dL (ref 1.6–2.3)

## 2021-07-11 LAB — TSH: TSH: 2.81 u[IU]/mL (ref 0.450–4.500)

## 2021-07-15 ENCOUNTER — Telehealth: Payer: Self-pay | Admitting: Cardiovascular Disease

## 2021-07-15 NOTE — Telephone Encounter (Signed)
-----   Message from Valora Corporal, RN sent at 07/11/2021  4:00 PM EST ----- Regarding: Appointment Is there anyway to schedule this patients appointment around a week after that Carotid has been done? Just wanted to check.

## 2021-07-15 NOTE — Telephone Encounter (Signed)
LVM to reschedule.

## 2021-07-28 ENCOUNTER — Ambulatory Visit: Payer: BC Managed Care – PPO | Admitting: Cardiovascular Disease

## 2021-08-14 ENCOUNTER — Ambulatory Visit (INDEPENDENT_AMBULATORY_CARE_PROVIDER_SITE_OTHER): Payer: BC Managed Care – PPO

## 2021-08-14 ENCOUNTER — Other Ambulatory Visit: Payer: Self-pay

## 2021-08-14 ENCOUNTER — Other Ambulatory Visit: Payer: Self-pay | Admitting: *Deleted

## 2021-08-14 DIAGNOSIS — D5911 Warm autoimmune hemolytic anemia: Secondary | ICD-10-CM

## 2021-08-14 DIAGNOSIS — R0989 Other specified symptoms and signs involving the circulatory and respiratory systems: Secondary | ICD-10-CM

## 2021-08-15 ENCOUNTER — Other Ambulatory Visit: Payer: Self-pay | Admitting: *Deleted

## 2021-08-15 DIAGNOSIS — I6523 Occlusion and stenosis of bilateral carotid arteries: Secondary | ICD-10-CM

## 2021-08-19 ENCOUNTER — Other Ambulatory Visit: Payer: Self-pay

## 2021-08-19 ENCOUNTER — Encounter: Payer: Self-pay | Admitting: Cardiovascular Disease

## 2021-08-19 ENCOUNTER — Ambulatory Visit: Payer: BC Managed Care – PPO | Admitting: Cardiovascular Disease

## 2021-08-19 VITALS — BP 144/80 | HR 82 | Ht 63.0 in | Wt 170.0 lb

## 2021-08-19 DIAGNOSIS — E782 Mixed hyperlipidemia: Secondary | ICD-10-CM | POA: Diagnosis not present

## 2021-08-19 DIAGNOSIS — I739 Peripheral vascular disease, unspecified: Secondary | ICD-10-CM | POA: Diagnosis not present

## 2021-08-19 DIAGNOSIS — I700 Atherosclerosis of aorta: Secondary | ICD-10-CM

## 2021-08-19 DIAGNOSIS — I10 Essential (primary) hypertension: Secondary | ICD-10-CM

## 2021-08-19 DIAGNOSIS — I6523 Occlusion and stenosis of bilateral carotid arteries: Secondary | ICD-10-CM | POA: Diagnosis not present

## 2021-08-19 DIAGNOSIS — I2510 Atherosclerotic heart disease of native coronary artery without angina pectoris: Secondary | ICD-10-CM

## 2021-08-19 MED ORDER — LISINOPRIL-HYDROCHLOROTHIAZIDE 20-25 MG PO TABS: 1.0000 | ORAL_TABLET | Freq: Every day | ORAL | 4 refills | Status: DC

## 2021-08-19 MED ORDER — ROSUVASTATIN CALCIUM 40 MG PO TABS
40.0000 mg | ORAL_TABLET | Freq: Every day | ORAL | 3 refills | Status: DC
Start: 2021-08-19 — End: 2022-08-25

## 2021-08-19 NOTE — Progress Notes (Signed)
Cardiology Office Note  Date:  08/19/2021   ID:  Tina Page, DOB 02-05-1956, MRN [id number]  PCP:  Frazier Richards, MD   Chief Complaint  Patient presents with   Follow-up    Follow up to review test results. Medications verbally reviewed with patient.     HPI:  65 year old woman with past medical history of Former smoker quit 07/2016, 40 years COPD Hyperlipidemia PAD with PTCA  Dr Ronalee Belts Hypertension Hemolytic anemia followed by oncology hematology Seen by pulmonary for lung nodule/mass right middle lobe bronchoscopy with diagnosis of organizing pneumonia, no malignancy Who presents for follow-up of her aorta atherosclerosis, coronary arteries, great vessels  Last seen in clinic by myself April 2019  Seen by one of our providers November 2022  Recent studies reviewed with her US carotids showed right ICA with 1-39% stenosis and left ICA with 40-59% stenosis,   CT lung screening November 2021 pulled up and reviewed Same results as before  moderate aortic atherosclerosis particularly in the arch At least moderate coronary calcification distal left main or bifurcation, proximal LAD, RCA  Reports chronic shortness of breath, stable breathing stable symptoms Inhaler prn  Other past medical history reviewed Went on a cruise Got viral URI, 10/2017  CT 10/2017, follow-up on a prior CT from November 2017 Spiculated RIGHT middle lobe mass 2.2 x 2.0 x 2.4 cm highly suspicious for a primary pulmonary neoplasm.  PET scan 2 cm right middle lobe lung mass is hypermetabolic and consistent with primary lung neoplasm.  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  CT scan  moderate aortic atherosclerosis particularly in the arch At least moderate coronary  calcification distal left main or bifurcation, proximal LAD, RCA  PMH:   has a past medical history of Anemia, Atherosclerosis of native arteries of extremity with intermittent claudication ([place]) (07/20/2016), COPD (chronic obstructive pulmonary disease) ([place]), 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  PSH:    Past Surgical History:  Procedure Laterality Date   ELECTROMAGNETIC NAVIGATION BROCHOSCOPY N/A 12/07/2017   Procedure: ELECTROMAGNETIC NAVIGATION BRONCHOSCOPY;  Surgeon: Flora Lipps, MD;  Location: [facility] ORS;  Service: Cardiopulmonary;  Laterality: N/A;   ESOPHAGOGASTRODUODENOSCOPY (EGD) WITH PROPOFOL N/A 07/08/2016   Procedure: ESOPHAGOGASTRODUODENOSCOPY (EGD) WITH PROPOFOL;  Surgeon: Manya Silvas, MD;  Location: [facility] ENDOSCOPY;  Service: Endoscopy;  Laterality: N/A;   KNEE SURGERY Right    repair of acl tear   PERIPHERAL VASCULAR CATHETERIZATION N/A 07/10/2016   Procedure: Lower Extremity Angiography;  Surgeon: Katha Cabal, MD;  Location: [place] [facility];  Service: Cardiovascular;  Laterality: N/A;   PERIPHERAL VASCULAR CATHETERIZATION N/A 07/10/2016   Procedure: Abdominal Aortogram w/Lower Extremity;  Surgeon: Katha Cabal, MD;  Location: [place] [facility];  Service: Cardiovascular;  Laterality: N/A;   PERIPHERAL VASCULAR CATHETERIZATION  07/10/2016   Procedure: Lower Extremity Intervention;  Surgeon: Katha Cabal, MD;  Location: [place] [facility];  Service: Cardiovascular;;    Current Outpatient Medications  Medication Sig Dispense Refill   albuterol (PROVENTIL HFA;VENTOLIN HFA) 108 (90 Base) MCG/ACT inhaler Inhale 2 puffs into the  lungs every 6 (six) hours as needed for wheezing or shortness of breath.      allopurinol (ZYLOPRIM) 100 MG tablet TAKE 1 TABLET BY MOUTH EVERY DAY 30 tablet 5   clopidogrel (PLAVIX) 75 MG tablet Take 1 tablet (75 mg total) by mouth daily. 30 tablet 5   cyanocobalamin 500 MCG tablet Take 500 mcg by mouth daily.     diclofenac Sodium (VOLTAREN) 1 % GEL Apply 2 g topically 4 (four) times daily. 756 g 2   folic acid (FOLVITE) 1 MG tablet TAKE 1 TABLET (1 MG TOTAL) BY MOUTH DAILY. 90 tablet 1   hydrochlorothiazide (HYDRODIURIL) 25 MG tablet Take 1 tablet by mouth daily. for high blood pressure     hydroxychloroquine (PLAQUENIL) 200 MG tablet Take 1 tablet by mouth 2 (two) times daily.     levothyroxine (SYNTHROID, LEVOTHROID) 75 MCG tablet Take 75 mcg by mouth daily before breakfast.      lisinopril (ZESTRIL) 20 MG tablet Take 20 mg by mouth daily.     omeprazole (PRILOSEC) 20 MG capsule Take 1 capsule (20 mg total) by mouth daily. 60 capsule 0   pravastatin (PRAVACHOL) 40 MG tablet Take 40 mg by mouth daily.     entecavir (BARACLUDE) 0.5 MG tablet Take 0.5 mg by mouth every other day. (Patient not taking: Reported on 08/19/2021)     Potassium Chloride ER 20 MEQ TBCR TAKE 1 TABLET BY MOUTH ONCE DAILY FOR LOW POTASSIUM (Patient not taking: Reported on 08/19/2021)     No current facility-administered medications for this visit.    Allergies:   Ciprofloxacin, Codeine, and Nsaids   Social History:  The patient  reports that she quit smoking about 5 years ago. Her smoking use included cigarettes. She has a 58.75 pack-year smoking history. She has never used smokeless tobacco. She reports that she does not drink alcohol and does not use drugs.   Family History:   family history includes Diabetes in her maternal grandfather and mother; Hypertension in her maternal grandfather and mother.    Review of Systems: Review of Systems  Constitutional: Negative.   Respiratory:  Positive for shortness of  breath.   Cardiovascular: Negative.   Gastrointestinal: Negative.   Musculoskeletal: Negative.   Neurological: Negative.   Psychiatric/Behavioral: Negative.    All other systems reviewed and are negative.   PHYSICAL EXAM: VS:  BP (!) 144/80 (BP Location: Left Arm, Patient Position: Sitting, Cuff Size: Normal)    Pulse 82    Ht 5\' 3"  (1.6 m)    Wt 170 lb (77.1 kg)    SpO2 97%    BMI 30.11 kg/m  , BMI Body mass index is 30.11 kg/m. Constitutional:  oriented to person, place, and time. No distress.  HENT:  Head: Grossly normal Eyes:  no discharge. No scleral icterus.  Neck: No JVD, no carotid bruits  Cardiovascular: Regular rate and rhythm, no murmurs appreciated Pulmonary/Chest: Clear to auscultation bilaterally, no wheezes or rails Abdominal: Soft.  no distension.  no tenderness.  Musculoskeletal: Normal range of motion Neurological:  normal muscle tone. Coordination normal. No atrophy Skin: Skin warm and dry Psychiatric: normal affect, pleasant  Recent Labs: 07/03/2021: ALT 11; BUN 30; Creatinine, Ser 1.28; Hemoglobin 10.6; Platelets 141; Potassium 3.8; Sodium 139 07/10/2021: Magnesium 1.6; TSH 2.810    Lipid Panel Lab Results  Component Value Date   CHOL 168 10/11/2019   HDL 36 (L) 10/11/2019   LDLCALC 104 (H) 10/11/2019   TRIG 160 (H) 10/11/2019  Wt Readings from Last 3 Encounters:  08/19/21 170 lb (77.1 kg)  07/10/21 169 lb 4 oz (76.8 kg)  05/22/21 173 lb 4.5 oz (78.6 kg)      ASSESSMENT AND PLAN:  Aortic atherosclerosis (HCC) - CT scan images pulled up and reviewed Moderate diffuse aortic atherosclerosis noted Cholesterol above goal, recommend she stop pravastatin and start Crestor 40 daily  PAD (peripheral artery disease) ([place]) - Plan: EKG 12-Lead Stenting to lower extremities with Dr. Delana Meyer Known carotid disease Smoking cessation recommended More aggressive lipid management recommended  Mixed hyperlipidemia Will change to Crestor as  above  Shortness of breath - Plan: NM Myocar Multi W/Spect W/Wall Motion / EF Long history of smoking Prior stress test April 2019 no ischemia, symptoms are stable  Essential hypertension Consolidated her medications, on discussion with her, we will change lisinopril HCTZ to 1 pill 20/25 mg daily    Total encounter time more than 25 minutes  Greater than 50% was spent in counseling and coordination of care with the patient   No orders of the defined types were placed in this encounter.    Signed, Esmond Plants, M.D., Ph.D. 08/19/2021  [place], [place]

## 2021-08-19 NOTE — Patient Instructions (Addendum)
Medication Instructions:   Your physician has recommended you make the following change in your medication:   STOP pravastatin START crestor 40 mg daily  STOP the lisinopril STOP the HCTZ START lisinopril HCTZ 20/25 one a day  If you need a refill on your cardiac medications before your next appointment, please call your pharmacy.    Lab work: No new labs needed   If you have labs (blood work) drawn today and your tests are completely normal, you will receive your results only by: [facility] (if you have MyChart) OR A paper copy in the mail If you have any lab test that is abnormal or we need to change your treatment, we will call you to review the results.   Testing/Procedures: No new testing needed   Follow-Up: At [facility], you and your health needs are our priority.  As part of our continuing mission to provide you with exceptional heart care, we have created designated Provider Care Teams.  These Care Teams include your primary Cardiologist (physician) and Advanced Practice Providers (APPs -  Physician Assistants and Nurse Practitioners) who all work together to provide you with the care you need, when you need it.  You will need a follow up appointment in 12 months  Providers on your designated Care Team:   Murray Hodgkins, NP Christell Faith, PA-C Cadence Kathlen Mody, PA-C  Any Other Special Instructions Will Be Listed Below (If Applicable).  COVID-19 Vaccine Information can be found at: [email or web address] For questions related to vaccine distribution or appointments, please email [email or web address] or call [phone].

## 2021-08-21 ENCOUNTER — Inpatient Hospital Stay (HOSPITAL_BASED_OUTPATIENT_CLINIC_OR_DEPARTMENT_OTHER): Payer: BC Managed Care – PPO | Admitting: Oncology

## 2021-08-21 ENCOUNTER — Encounter: Payer: Self-pay | Admitting: Oncology

## 2021-08-21 ENCOUNTER — Inpatient Hospital Stay: Payer: BC Managed Care – PPO | Attending: Oncology

## 2021-08-21 ENCOUNTER — Other Ambulatory Visit: Payer: Self-pay

## 2021-08-21 VITALS — BP 149/66 | HR 71 | Temp 97.8°F | Resp 20 | Wt 172.3 lb

## 2021-08-21 DIAGNOSIS — Z87891 Personal history of nicotine dependence: Secondary | ICD-10-CM | POA: Diagnosis not present

## 2021-08-21 DIAGNOSIS — D5911 Warm autoimmune hemolytic anemia: Secondary | ICD-10-CM

## 2021-08-21 DIAGNOSIS — R911 Solitary pulmonary nodule: Secondary | ICD-10-CM

## 2021-08-21 DIAGNOSIS — B1910 Unspecified viral hepatitis B without hepatic coma: Secondary | ICD-10-CM | POA: Diagnosis not present

## 2021-08-21 DIAGNOSIS — E538 Deficiency of other specified B group vitamins: Secondary | ICD-10-CM | POA: Insufficient documentation

## 2021-08-21 DIAGNOSIS — R768 Other specified abnormal immunological findings in serum: Secondary | ICD-10-CM

## 2021-08-21 DIAGNOSIS — R918 Other nonspecific abnormal finding of lung field: Secondary | ICD-10-CM | POA: Diagnosis not present

## 2021-08-21 LAB — COMPREHENSIVE METABOLIC PANEL
ALT: 12 U/L (ref 0–44)
AST: 16 U/L (ref 15–41)
Albumin: 4.4 g/dL (ref 3.5–5.0)
Alkaline Phosphatase: 79 U/L (ref 38–126)
Anion gap: 9 (ref 5–15)
BUN: 24 mg/dL — ABNORMAL HIGH (ref 8–23)
CO2: 30 mmol/L (ref 22–32)
Calcium: 9.3 mg/dL (ref 8.9–10.3)
Chloride: 99 mmol/L (ref 98–111)
Creatinine, Ser: 1.06 mg/dL — ABNORMAL HIGH (ref 0.44–1.00)
GFR, Estimated: 59 mL/min — ABNORMAL LOW (ref >60)
Glucose, Bld: 93 mg/dL (ref 70–99)
Potassium: 3.8 mmol/L (ref 3.5–5.1)
Sodium: 138 mmol/L (ref 135–145)
Total Bilirubin: 0.6 mg/dL (ref 0.3–1.2)
Total Protein: 7.3 g/dL (ref 6.5–8.1)

## 2021-08-21 LAB — CBC WITH DIFFERENTIAL/PLATELET
Abs Immature Granulocytes: 0.01 10*3/uL (ref 0.00–0.07)
Basophils Absolute: 0.1 10*3/uL (ref 0.0–0.1)
Basophils Relative: 1 %
Eosinophils Absolute: 0.1 10*3/uL (ref 0.0–0.5)
Eosinophils Relative: 3 %
HCT: 37.3 % (ref 36.0–46.0)
Hemoglobin: 12.1 g/dL (ref 12.0–15.0)
Immature Granulocytes: 0 %
Lymphocytes Relative: 21 %
Lymphs Abs: 1 10*3/uL (ref 0.7–4.0)
MCH: 30 pg (ref 26.0–34.0)
MCHC: 32.4 g/dL (ref 30.0–36.0)
MCV: 92.3 fL (ref 80.0–100.0)
Monocytes Absolute: 0.4 10*3/uL (ref 0.1–1.0)
Monocytes Relative: 9 %
Neutro Abs: 3.2 10*3/uL (ref 1.7–7.7)
Neutrophils Relative %: 66 %
Platelets: 128 10*3/uL — ABNORMAL LOW (ref 150–400)
RBC: 4.04 MIL/uL (ref 3.87–5.11)
RDW: 14.6 % (ref 11.5–15.5)
WBC: 4.8 10*3/uL (ref 4.0–10.5)
nRBC: 0 % (ref 0.0–0.2)

## 2021-08-21 LAB — RETIC PANEL
Immature Retic Fract: 8.8 % (ref 2.3–15.9)
RBC.: 4.15 MIL/uL (ref 3.87–5.11)
Retic Count, Absolute: 87.6 10*3/uL (ref 19.0–186.0)
Retic Ct Pct: 2.1 % (ref 0.4–3.1)
Reticulocyte Hemoglobin: 31.7 pg (ref >27.9)

## 2021-08-21 LAB — VITAMIN B12: Vitamin B-12: 798 pg/mL (ref 180–914)

## 2021-08-21 LAB — LACTATE DEHYDROGENASE: LDH: 171 U/L (ref 98–192)

## 2021-08-21 NOTE — Progress Notes (Signed)
Hematology oncology progress note   Clinic Day:  08/21/2021  Referring physician: Frazier Richards, MD  Chief Complaint: Tina Page is a 65 y.o. 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  Reticulocyte count was 11.3%.  Ferritin was 562.  Iron studies revealed a saturation of 20% and a TIBC of 214 (low).  B12 was 231 (low normal).  Folate was 42.   Peripheral smear revealed rouleaux formation.    Additional testing included the following + studies:  hepatitis C antibody, hepatitis B core antibody, CMV IgM, and EBV VCA (IgM and IgG).  Hepatitis B by PCR was negative.  Hepatitis C RNA was negative.  Mycoplasma pneumonia IgM was negative.  Reticulocyte count was 11.3% (high) indicating appropriate marrow response.  C3 and C4 were normal.  Cold agglutinin titer was negative x 2.  Negative studies included:  ANA, hepatitis B surface antigen, SPEP, and free light chain ratio.   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  MMA was initially 330 (normal).  Repeat MMA was elevated on 08/01/2016 confirming B12 deficiency.  Hepatitis B surface antibody was positive and hepatitis B surface antigen was negative on 08/06/2016.   07/07/2016  Chest, abdomen, and pelvis CT angiogram on revealed moderate diffuse atherosclerotic vascular disease of the abdominal aorta with severe stenosis of the left common iliac artery with suspected short segment occlusion. There was no adenopathy.  Spleen was normal.   04/15/2017 Abd US revealed a normal spleen and sludge in the gallbladder.  The liver was echogenic consistent fatty infiltration and/or  hepatocellular disease.  For hemolytic anemia treatments, in 2017, She has received multiple units of  warmed PRBCs to date.  08/01/2016-03/12/2017, steroid treatment with prednisone.  She is also on folic acid 1 mg daily. Borderline low vitamin B12 level on 07/09/2016.  Patient has been on oral vitamin B12 supplementation.  10/12/2017  positive warm autoantibody.  LDH and bilirubin were normal.   She began prednisone 10 mg on 12/07/2017 (discontinued 03/2018).  She received Rituxan weekly x 4 (last dose 06/16/2018).  Entecavir was discontinued on 07/06/2019.  06/12/2020 developed recurrent anemia.  Hematocrit was 27.6, hemoglobin 8.4, MCV 104.5, platelets 138,000, WBC 3,400 (ANC 2,300). Creatinine was 1.17 (CrCl 50 ml/min).  Positive warm autoantibody  Reticulocyte count was 7.1%.  LDH was 204 (98-192).  06/16/2020, began prednisone 1 mg/kilogram treatments, 07/24/2020, prednisone was tapered down to 20 mg.  She received Rituxan 1000 mg on day 1 and 15 (07/15/2020 and 07/29/2020).  She began entecavir on 06/28/2020.  Prednisone was tapered off.  She was on Septra for PCP prophylaxis which is now currently on hold    Other medical problems 07/10/2016.  She underwent PTCA and stent placement in right and left common iliac arteries and left external iliac artery. She is on Plavix.   03/19/2015 EGD revealed gastritis in the body and antrum.  Colonoscopy revealed one hyperplastic polyp.  Repeat EGD on 07/08/2016 was normal.  No evidence of bleeding.  History of she developed peri-oral and intranasal herpes simplex-1.  She was treated with valacyclovir and doxycycline on 10/19/2016.   10/26/2017. She developed flu like symptoms.  Symptoms included cough, myalgias, and fever (tmax unknown).  She was prescribed Mucinex, Tamiflu, and amoxicillin.  She only took amoxicillin x 5 days.  She developed increased liver function tests on 11/02/2017. CMV IgG was positive.  EBV VCA IgG, NA IgG, early antigen antibody  IgG were elevated.  EBV VCA IgM was < 36.  Testing was c/w a convalescence/past infection or reactivated infection.  LFTs normalized on 11/15/2017.  Smooth muscle antibody was 39 (high) on 11/10/2017 and 34 (high) on 11/30/2017.     Chest CT on 11/16/2017 revealed a 2.2 x 2.0 x 2.4 spiculated RIGHT middle lobe mass.  There was tiny nonspecific upper lobe pulmonary nodules greater on RIGHT, largest 3 mm, of uncertain etiology.  There was additional 8 x 8 mm opacity in the posterior sulcus of the RIGHT lower lobe. PET scan on 11/22/2017 revealed a 2 cm hypermetabolic right middle lobe lung mass (SUV 3.4) consistent  with primary lung neoplasm.  There were no findings for mediastinal/hilar lymphadenopathy or metastatic disease.  There were areas of hypermetabolism involving the left oblique abdominal muscles and the anorectal junction. PFTs on 11/18/2017 revealed an FEV1 of 1.22 liters (51%).  DLCO adj was 6.8 mL/mmHg/min (32%).   ENB done on 12/07/2017. Cytology was negative for malignancy. Pathology demonstrated organizing pneumonia and chronic bronchitis. Pathologist commented that organizing pneumonia spanned about 2 mm in one fragment. Cases of focal organizing pneumonia with hypermetabolism on FDG-PET have been described, but changes of this type can also be adjacent to a neoplasm. Patient prescribed a daily dose of Prednisone 10 mg   07/24/2020  repeat  chest CT on  revealed Lung-RADS 2, benign appearance or behavior. There was emphysema, aortic atherosclerosis, and coronary artery calcifications.  diarrhea.  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  She received azithromycin x 3 days.  She received ciprofloxacin x 10 days without improvement.  She has seen ID in [place].  She began Bactrim on 03/04/2018 (discontinued on 03/11/2018) secondary to increase in creatine.   Chronic kidney disease Urinalysis on 11/15/2017 revealed revealed no hemoglobin, bilirubin or  active sediment.   Liver function tests increased on 02/15/2019.  Work-up on 02/16/2019 revealed hepatitis B E antibody positive.  Negative studies included: hepatitis A total antibody, hepatitis A IgM, hepatitis B surface antigen, hepatitis B E antigen.  Hepatitis B DNA was not detected.  Folate was 80.5 and vitamin B12 was 443.  She has received her vaccinations:  She has completed the PCV13, PPSV23, and HiB.  She received Menvio (quadrivalent meningoccal vaccine) and Bexero (univalent serogroup B meningoccal vaccine) on 07/06/2019.  She was diagnosed with C difficile + diarrhea on 08/12/2020.  She completed a course of oral vancomycin.  Stool was negative for C. difficile on 08/27/2020.  11/17/2020 - 11/20/2020 [facility] admission with left L5 varicella zoster.  Lesion was positive for VZV. She was treated with ceftazidime and a valacyclovir.  She was discharged on 9 days of Valtrex 1 gm 3 times daily followed by 500 mg daily indefinitely for suppression and doxycycline.  History of discoid lupus  #Positive hepatitis B core antibody Previously on entecavir 0.5 mg daily, currently she has finished a course. Dose adjusted based on renal function. CrCl 30-49 ml/min   50% dosing (0.5 mg QOD) CrCl >= 50 ml/min 100% dosing (0.5 mg a day). With her kidney function currently QOD  INTERVAL HISTORY Tina Page is a 65 y.o. female who has above history reviewed by me today presents for follow up visit  Patient reports feeling  well today.  She has completed course of Entecavir and stopped 08/14/2021.    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/23/2017   Post PTCA 07/12/2016   Thrombocytopenia ([place]) 10/04/2016   Wears dentures    full upper and lower    Past Surgical History:  Procedure Laterality Date   ELECTROMAGNETIC NAVIGATION BROCHOSCOPY N/A 12/07/2017   Procedure: ELECTROMAGNETIC NAVIGATION BRONCHOSCOPY;  Surgeon: Flora Lipps, MD;  Location: [facility] ORS;  Service: Cardiopulmonary;  Laterality: N/A;   ESOPHAGOGASTRODUODENOSCOPY (EGD) WITH PROPOFOL N/A 07/08/2016   Procedure: ESOPHAGOGASTRODUODENOSCOPY (EGD) WITH PROPOFOL;  Surgeon: Manya Silvas, MD;  Location: [facility] ENDOSCOPY;  Service: Endoscopy;  Laterality: N/A;   KNEE SURGERY Right    repair of acl tear   PERIPHERAL VASCULAR CATHETERIZATION N/A 07/10/2016   Procedure: Lower Extremity Angiography;  Surgeon: Katha Cabal, MD;  Location: [place] CV LAB;  Service: Cardiovascular;  Laterality: N/A;   PERIPHERAL VASCULAR CATHETERIZATION N/A 07/10/2016   Procedure: Abdominal Aortogram w/Lower Extremity;  Surgeon: Katha Cabal, MD;  Location: [place] [facility];  Service: Cardiovascular;  Laterality: N/A;   PERIPHERAL VASCULAR CATHETERIZATION  07/10/2016   Procedure: Lower Extremity Intervention;  Surgeon: Katha Cabal, MD;  Location: [place] [facility] LAB;  Service: Cardiovascular;;    Family History  Problem Relation Age of Onset   Diabetes Mother    Hypertension Mother    Diabetes Maternal Grandfather    Hypertension Maternal Grandfather    Breast cancer Neg Hx     Social History:  reports that she quit smoking about 5 years ago. Her smoking use included cigarettes. She has a 58.75 pack-year smoking history. She has never used smokeless tobacco. She reports that she does not drink alcohol and does not use drugs. She has a 21 pack year smoking history (1/2 pack/day from age [age]).  The patient works at [employer]  She is  exposed to cold temperatures.  She works 3rd shift.  She has a daughter, [name] and a daughter named [name].  She lives in [place]. The patient is alone today.  Allergies:  Allergies  Allergen Reactions   Ciprofloxacin Swelling    Facial swelling following single oral dose.    Codeine Anaphylaxis   Nsaids Other (See Comments)    Patient has Hep C.     Current Medications: Current Outpatient Medications  Medication Sig Dispense Refill   albuterol (PROVENTIL HFA;VENTOLIN HFA) 108 (90 Base) MCG/ACT inhaler Inhale 2 puffs into the lungs every 6 (six) hours as needed for wheezing or shortness of breath.      allopurinol (ZYLOPRIM) 100 MG tablet TAKE 1 TABLET BY MOUTH EVERY DAY 30 tablet 5   clopidogrel (PLAVIX) 75 MG tablet Take 1 tablet (75 mg total) by mouth daily. 30 tablet 5   cyanocobalamin 500 MCG tablet Take 500 mcg by mouth daily.     diclofenac Sodium (VOLTAREN) 1 % GEL Apply 2 g topically 4 (four) times daily. 324 g 2   folic acid (FOLVITE) 1 MG  tablet TAKE 1 TABLET (1 MG TOTAL) BY MOUTH DAILY. 90 tablet 1   hydroxychloroquine (PLAQUENIL) 200 MG tablet Take 1 tablet by mouth 2 (two) times daily.     levothyroxine (SYNTHROID, LEVOTHROID) 75 MCG tablet Take 75 mcg by mouth daily before breakfast.      lisinopril-hydrochlorothiazide (ZESTORETIC) 20-25 MG tablet Take 1 tablet by mouth daily. 90 tablet 4   omeprazole (PRILOSEC) 20 MG capsule Take 1 capsule (20 mg total) by mouth daily. 60 capsule 0   rosuvastatin (CRESTOR) 40 MG tablet Take 1 tablet (40 mg total) by mouth daily. 90 tablet 3   entecavir (BARACLUDE) 0.5 MG tablet Take 0.5 mg by mouth every other day. (Patient not taking: Reported on 08/19/2021)     Potassium Chloride ER 20 MEQ TBCR TAKE 1 TABLET BY MOUTH ONCE DAILY FOR LOW POTASSIUM (Patient not taking: Reported on 08/19/2021)     No current facility-administered medications for this visit.    Review of Systems  Constitutional:  Negative for chills, diaphoresis,  fever and malaise/fatigue.       Feels "ok."  HENT:  Negative for congestion, ear discharge, ear pain, hearing loss, nosebleeds, sinus pain, sore throat and tinnitus.   Eyes: Negative.  Negative for blurred vision, double vision, photophobia and pain.  Respiratory: Negative.  Negative for cough, hemoptysis, sputum production and shortness of breath.   Cardiovascular: Negative.  Negative for chest pain, palpitations and leg swelling.  Gastrointestinal:  Negative for abdominal pain, blood in stool, constipation, diarrhea, heartburn, melena, nausea and vomiting.  Genitourinary: Negative.  Negative for dysuria, frequency, hematuria and urgency.  Musculoskeletal: Negative.  Negative for back pain, joint pain, myalgias and neck pain.  Skin:  Negative for itching and rash.  Neurological:  Negative for dizziness, tingling, sensory change, weakness and headaches.  Endo/Heme/Allergies:  Negative for environmental allergies. Does not bruise/bleed easily.  Psychiatric/Behavioral:  Negative for depression and memory loss. The patient is not nervous/anxious and does not have insomnia.   All other systems reviewed and are negative. Performance status (ECOG): 1  Vital Signs Blood pressure (!) 149/66, pulse 71, temperature 97.8 F (36.6 C), resp. rate 20, weight 172 lb 4.8 oz (78.2 kg), SpO2 100 %.  Physical Exam Vitals and nursing note reviewed.  Constitutional:      General: She is not in acute distress.    Appearance: She is obese. She is not diaphoretic.  HENT:     Head: Normocephalic and atraumatic.     Mouth/Throat:     Mouth: Mucous membranes are moist.     Pharynx: Oropharynx is clear.  Eyes:     General: No scleral icterus.    Pupils: Pupils are equal, round, and reactive to light.  Cardiovascular:     Rate and Rhythm: Normal rate and regular rhythm.     Heart sounds: Normal heart sounds. No murmur heard. Pulmonary:     Effort: Pulmonary effort is normal. No respiratory distress.      Breath sounds: Normal breath sounds. No wheezing or rales.  Chest:     Chest wall: No tenderness.  Abdominal:     General: Bowel sounds are normal. There is no distension.     Palpations: Abdomen is soft. There is no mass.     Tenderness: There is no abdominal tenderness. There is no guarding or rebound.  Musculoskeletal:        General: No swelling or tenderness. Normal range of motion.     Cervical back: Normal range of motion  and neck supple.  Lymphadenopathy:     Head:     Right side of head: No preauricular, posterior auricular or occipital adenopathy.     Left side of head: No preauricular, posterior auricular or occipital adenopathy.     Cervical: No cervical adenopathy.     Upper Body:     Right upper body: No supraclavicular or axillary adenopathy.     Left upper body: No supraclavicular or axillary adenopathy.     Lower Body: No right inguinal adenopathy. No left inguinal adenopathy.  Skin:    General: Skin is warm and dry.  Neurological:     Mental Status: She is alert and oriented to person, place, and time.  Psychiatric:        Mood and Affect: Mood normal.   Laboratory findings CBC    Component Value Date/Time   WBC 4.8 08/21/2021 0926   RBC 4.15 08/21/2021 0926   RBC 4.04 08/21/2021 0926   HGB 12.1 08/21/2021 0926   HGB 9.0 (L) 02/18/2018 1204   HCT 37.3 08/21/2021 0926   HCT 46.5 06/11/2014 1732   PLT 128 (L) 08/21/2021 0926   PLT 127 (L) 06/11/2014 1732   MCV 92.3 08/21/2021 0926   MCV 92 06/11/2014 1732   MCH 30.0 08/21/2021 0926   MCHC 32.4 08/21/2021 0926   RDW 14.6 08/21/2021 0926   RDW 14.5 06/11/2014 1732   LYMPHSABS 1.0 08/21/2021 0926   MONOABS 0.4 08/21/2021 0926   EOSABS 0.1 08/21/2021 0926   BASOSABS 0.1 08/21/2021 0926   CMP Latest Ref Rng & Units 08/21/2021 07/03/2021 06/04/2021  Glucose 70 - 99 mg/dL 93 88 83  BUN 8 - 23 mg/dL 24(H) 30(H) 33(H)  Creatinine 0.44 - 1.00 mg/dL 1.06(H) 1.28(H) 1.46(H)  Sodium 135 - 145 mmol/L 138 139  134(L)  Potassium 3.5 - 5.1 mmol/L 3.8 3.8 4.1  Chloride 98 - 111 mmol/L 99 101 97(L)  CO2 22 - 32 mmol/L 30 30 30   Calcium 8.9 - 10.3 mg/dL 9.3 8.9 8.6(L)  Total Protein 6.5 - 8.1 g/dL 7.3 7.6 -  Total Bilirubin 0.3 - 1.2 mg/dL 0.6 0.6 -  Alkaline Phos 38 - 126 U/L 79 71 -  AST 15 - 41 U/L 16 14(L) -  ALT 0 - 44 U/L 12 11 -     Assessment and plan   1. Warm antibody hemolytic anemia (HCC)   2. Hepatitis B core antibody positive   3. B12 deficiency   4. Nodule of middle lobe of right lung    #History of recurrent warm antibody hemolytic anemia, Labs reviewed and discussed with patient. Hemoglobin is normal.  LDH normal.  No signs of hemolysis.  #History of vitamin B12 deficiency. Continue oral vitamin B12 500 MCG daily.   # Right middle lobe nodule, continue to follow pulmonology CT findings felt consistent with vaccine and waning process, probably atypical infectious process. Former smoker, she is overdue for lung cancer screening.  We will refer her to establish with screening program.   # PCP prophylaxis Patient was on Septra DS on Mondays, Wednesdays, and Fridays, which was held due to fluctuated kidney functions.  RTC   3 months-lab MD, CBC CMP reticulocyte panel, B12, LDH  I discussed the assessment and treatment plan with the patient.  The patient was provided an opportunity to ask questions and all were answered.  The patient agreed with the plan and demonstrated an understanding of the instructions.  The patient was advised to call back or  seek an in person evaluation if the symptoms worsen or if the condition fails to improve as anticipated.   Earlie Server, MD, PhD  08/21/2021

## 2021-08-26 ENCOUNTER — Encounter: Payer: Self-pay | Admitting: Cardiovascular Disease

## 2021-08-26 ENCOUNTER — Telehealth: Payer: Self-pay | Admitting: Cardiovascular Disease

## 2021-08-26 NOTE — Telephone Encounter (Signed)
Pt sent a MyChart message regarding same, before return phone call could be made. Message advised  Tina Page,  We are VERY busy with overwhelm of incoming calls, but we were about to return your call, since you have message [name] on MyChart we can respond on here as well.   If you believe you are having a reaction to medication then please discontinue  (STOP) the medication   If having trouble breathing, difficulty swallowing, rash/hives, or swelling gets worse then seek the ER for steroids and other interventions as OTC benadryl may not be enough.  Most likely medication is from [place] as it has lisinopril in it, lisinopril has common side affect of facial and lip edema. Although you have been on lisinopril in the past, it may not be from this, but suggest stopping to see if symptoms gets better. Sometimes, lisinopril can have reaction after being on it for a while.   Stop the medication for now, monitor your symptoms, and we will route message to Dr. Rockey Situ for review and alternat medication.   As advised, if it gets worse, please seek the ER, in the meantime stop Zestoretic, we will be back in touch once Dr. Rockey Situ suggest alternative medication. It could be several days before Dr. Rockey Situ can review his messages as he is not in the office, in the meantime monitor you BP.  Hope this helps and hope you feel better [facility], RN

## 2021-08-26 NOTE — Telephone Encounter (Signed)
Pt c/o medication issue:  1. Name of Medication: Zestoretic 20-25mg  1 tablet daily, Crestor 40mg  1 tablet daily  2. How are you currently taking this medication (dosage and times per day)? Same as above  3. Are you having a reaction (difficulty breathing--STAT)? Swelling under eye & face  4. What is your medication issue? New medication concerned of side effects

## 2021-08-27 ENCOUNTER — Emergency Department
Admission: EM | Admit: 2021-08-27 | Discharge: 2021-08-27 | Disposition: A | Discharge status: Home / Self Care | Payer: BC Managed Care – PPO | Source: Home / Self Care | Attending: Emergency Medicine | Admitting: Emergency Medicine

## 2021-08-27 ENCOUNTER — Emergency Department: Payer: BC Managed Care – PPO

## 2021-08-27 ENCOUNTER — Other Ambulatory Visit: Payer: Self-pay

## 2021-08-27 ENCOUNTER — Encounter: Payer: Self-pay | Admitting: Emergency Medicine

## 2021-08-27 DIAGNOSIS — R229 Localized swelling, mass and lump, unspecified: Secondary | ICD-10-CM | POA: Diagnosis not present

## 2021-08-27 DIAGNOSIS — Z87891 Personal history of nicotine dependence: Secondary | ICD-10-CM | POA: Insufficient documentation

## 2021-08-27 DIAGNOSIS — I129 Hypertensive chronic kidney disease with stage 1 through stage 4 chronic kidney disease, or unspecified chronic kidney disease: Secondary | ICD-10-CM | POA: Insufficient documentation

## 2021-08-27 DIAGNOSIS — Z7951 Long term (current) use of inhaled steroids: Secondary | ICD-10-CM | POA: Insufficient documentation

## 2021-08-27 DIAGNOSIS — E039 Hypothyroidism, unspecified: Secondary | ICD-10-CM | POA: Insufficient documentation

## 2021-08-27 DIAGNOSIS — Z7902 Long term (current) use of antithrombotics/antiplatelets: Secondary | ICD-10-CM | POA: Insufficient documentation

## 2021-08-27 DIAGNOSIS — Z79899 Other long term (current) drug therapy: Secondary | ICD-10-CM | POA: Insufficient documentation

## 2021-08-27 DIAGNOSIS — N183 Chronic kidney disease, stage 3 unspecified: Secondary | ICD-10-CM | POA: Insufficient documentation

## 2021-08-27 DIAGNOSIS — J449 Chronic obstructive pulmonary disease, unspecified: Secondary | ICD-10-CM | POA: Insufficient documentation

## 2021-08-27 DIAGNOSIS — L03213 Periorbital cellulitis: Secondary | ICD-10-CM

## 2021-08-27 LAB — BASIC METABOLIC PANEL
Anion gap: 8 (ref 5–15)
BUN: 21 mg/dL (ref 8–23)
CO2: 27 mmol/L (ref 22–32)
Calcium: 9.5 mg/dL (ref 8.9–10.3)
Chloride: 102 mmol/L (ref 98–111)
Creatinine, Ser: 0.94 mg/dL (ref 0.44–1.00)
GFR, Estimated: >60 mL/min (ref >60)
Glucose, Bld: 114 mg/dL — ABNORMAL HIGH (ref 70–99)
Potassium: 3.8 mmol/L (ref 3.5–5.1)
Sodium: 137 mmol/L (ref 135–145)

## 2021-08-27 LAB — CBC WITH DIFFERENTIAL/PLATELET
Abs Immature Granulocytes: 0.03 10*3/uL (ref 0.00–0.07)
Basophils Absolute: 0.1 10*3/uL (ref 0.0–0.1)
Basophils Relative: 1 %
Eosinophils Absolute: 0.1 10*3/uL (ref 0.0–0.5)
Eosinophils Relative: 2 %
HCT: 34.5 % — ABNORMAL LOW (ref 36.0–46.0)
Hemoglobin: 11.3 g/dL — ABNORMAL LOW (ref 12.0–15.0)
Immature Granulocytes: 1 %
Lymphocytes Relative: 17 %
Lymphs Abs: 1.1 10*3/uL (ref 0.7–4.0)
MCH: 30.1 pg (ref 26.0–34.0)
MCHC: 32.8 g/dL (ref 30.0–36.0)
MCV: 92 fL (ref 80.0–100.0)
Monocytes Absolute: 0.7 10*3/uL (ref 0.1–1.0)
Monocytes Relative: 11 %
Neutro Abs: 4.3 10*3/uL (ref 1.7–7.7)
Neutrophils Relative %: 68 %
Platelets: 111 10*3/uL — ABNORMAL LOW (ref 150–400)
RBC: 3.75 MIL/uL — ABNORMAL LOW (ref 3.87–5.11)
RDW: 14.2 % (ref 11.5–15.5)
WBC: 6.2 10*3/uL (ref 4.0–10.5)
nRBC: 0 % (ref 0.0–0.2)

## 2021-08-27 MED ORDER — AMOXICILLIN-POT CLAVULANATE 875-125 MG PO TABS
1.0000 | ORAL_TABLET | Freq: Once | ORAL | Status: AC
  Administered 2021-08-27: 23:00:00 1 via ORAL
  Filled 2021-08-27: qty 1

## 2021-08-27 MED ORDER — IOHEXOL 300 MG/ML  SOLN
75.0000 mL | Freq: Once | INTRAMUSCULAR | Status: AC | PRN
  Administered 2021-08-27: 21:00:00 75 mL via INTRAVENOUS
  Filled 2021-08-27: qty 75

## 2021-08-27 MED ORDER — AMOXICILLIN-POT CLAVULANATE 875-125 MG PO TABS: 1.0000 | ORAL_TABLET | Freq: Two times a day (BID) | ORAL | 0 refills | Status: DC

## 2021-08-27 NOTE — Discharge Instructions (Signed)
Please follow up with primary care next week. See the eye doctor if you begin to have pain with movement of the eye. Return to the ER for any symptom that changes or worsens if unable to schedule ana appointment.

## 2021-08-27 NOTE — ED Provider Notes (Signed)
[facility] Emergency Department Provider Note ____________________________________________  Time seen: Approximately 7:19 PM  I have reviewed the triage vital signs and the nursing notes.   HISTORY  Chief Complaint Eye Problem   HPI Tina Page is a 65 y.o. female presents to the emergency department for swelling around right eye. Symptoms started 2-3 days ago. No relief with Benadryl. No fever, vision is normal.   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   Elevated liver function tests 11/03/2017   GERD (gastroesophageal reflux disease)    Heme positive stool 01/29/2015   Hepatitis C 07/12/2016   HLD (hyperlipidemia)    Hypertension    Hypothyroidism    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/23/2017   Post PTCA 07/12/2016   Thrombocytopenia ([place]) 10/04/2016   Wears dentures    full upper and lower    Patient Active Problem List   Diagnosis Date Noted   Leukopenia 11/22/2020   Herpes zoster without complication    Left leg pain 11/17/2020   Diarrhea 09/01/2020   Clostridium difficile diarrhea 08/26/2020   Lupus ([place]) 08/11/2020   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[place]) 08/11/2020   Chronic viral hepatitis B without delta agent and without coma ([place]) 08/11/2020   Papular rash 08/05/2020   Bacteremia due to Escherichia coli 07/17/2020   UTI (urinary tract infection) 07/16/2020   Hypothyroidism    Fever    Encounter for antineoplastic immunotherapy 07/15/2020   Goals of care, counseling/discussion 06/19/2020   Abnormal CT of the chest 06/04/2020   Oral candidiasis 06/04/2020   Former smoker 06/04/2020   Pulmonary nodules 12/28/2019   Chronic kidney disease, stage 3 unspecified ([place]) 05/15/2019   Postop check 04/24/2019    Paronychia of great toe of right foot 04/17/2019   Ingrowing nail 04/17/2019   Medication monitoring encounter 03/25/2018   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   Elevated liver function tests 11/03/2017   Elevated uric acid in blood 11/03/2017   Low serum cortisol level 01/04/2017   Elevated alkaline phosphatase level 10/25/2016   Oral herpes simplex infection 10/25/2016   Current chronic use of systemic steroids 10/23/2016   Impetigo 10/23/2016   Thrombocytopenia ([place]) 10/04/2016   B12 deficiency 08/06/2016   Symptomatic anemia 07/21/2016   Atherosclerosis of native arteries of extremity with intermittent claudication ([place]) 07/20/2016   PAD (peripheral artery disease) ([place]) 07/12/2016   Post PTCA 07/12/2016   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   Autoimmune hemolytic anemia, 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   Hepatitis B core antibody positive 07/12/2016   Tobacco abuse 07/12/2016   Leg pain 07/07/2016   Essential hypertension 07/07/2016   HLD (hyperlipidemia) 07/07/2016   COPD (chronic obstructive pulmonary disease) ([place]) 07/07/2016   Acute kidney injury superimposed on CKD ([place]) 07/07/2016   Heme positive stool 01/29/2015    Past Surgical History:  Procedure Laterality Date   ELECTROMAGNETIC NAVIGATION BROCHOSCOPY N/A 12/07/2017   Procedure: ELECTROMAGNETIC NAVIGATION BRONCHOSCOPY;  Surgeon: Flora Lipps, MD;  Location: [facility]  ORS;  Service: Cardiopulmonary;  Laterality: N/A;   ESOPHAGOGASTRODUODENOSCOPY (EGD) WITH PROPOFOL N/A 07/08/2016   Procedure: ESOPHAGOGASTRODUODENOSCOPY (EGD) WITH PROPOFOL;  Surgeon: Manya Silvas, MD;  Location:  [facility] ENDOSCOPY;  Service: Endoscopy;  Laterality: N/A;   KNEE SURGERY Right    repair of acl tear   PERIPHERAL VASCULAR CATHETERIZATION N/A 07/10/2016   Procedure: Lower Extremity Angiography;  Surgeon: Katha Cabal, MD;  Location: [place] CV LAB;  Service: Cardiovascular;  Laterality: N/A;   PERIPHERAL VASCULAR CATHETERIZATION N/A 07/10/2016   Procedure: Abdominal Aortogram w/Lower Extremity;  Surgeon: Katha Cabal, MD;  Location: [place] [facility] LAB;  Service: Cardiovascular;  Laterality: N/A;   PERIPHERAL VASCULAR CATHETERIZATION  07/10/2016   Procedure: Lower Extremity Intervention;  Surgeon: Katha Cabal, MD;  Location: [place] CV LAB;  Service: Cardiovascular;;    Prior to Admission medications   Medication Sig Start Date End Date Taking? Authorizing Provider  amoxicillin-clavulanate (AUGMENTIN) 875-125 MG tablet Take 1 tablet by mouth 2 (two) times daily for 10 days. 08/27/21 09/06/21 Yes Lianna Sitzmann B, FNP  albuterol (PROVENTIL HFA;VENTOLIN HFA) 108 (90 Base) MCG/ACT inhaler Inhale 2 puffs into the lungs every 6 (six) hours as needed for wheezing or shortness of breath.     [provider]  allopurinol (ZYLOPRIM) 100 MG tablet TAKE 1 TABLET BY MOUTH EVERY DAY 06/02/19   Lequita Asal, MD  clopidogrel (PLAVIX) 75 MG tablet Take 1 tablet (75 mg total) by mouth daily. 07/13/16   Theodoro Grist, MD  cyanocobalamin 500 MCG tablet Take 500 mcg by mouth daily.    [provider]  diclofenac Sodium (VOLTAREN) 1 % GEL Apply 2 g topically 4 (four) times daily. 07/18/20   Ezekiel Slocumb, DO  entecavir (BARACLUDE) 0.5 MG tablet Take 0.5 mg by mouth every other day. Patient not taking: Reported on 08/19/2021    [provider]  folic acid (FOLVITE) 1 MG tablet TAKE 1 TABLET (1 MG TOTAL) BY MOUTH DAILY. 08/29/18   Lequita Asal, MD  hydroxychloroquine (PLAQUENIL) 200 MG tablet Take 1 tablet by mouth 2 (two) times daily. 07/17/21    [provider]  levothyroxine (SYNTHROID, LEVOTHROID) 75 MCG tablet Take 75 mcg by mouth daily before breakfast.  12/30/17   [provider]  lisinopril-hydrochlorothiazide (ZESTORETIC) 20-25 MG tablet Take 1 tablet by mouth daily. 08/19/21   Minna Merritts, MD  omeprazole (PRILOSEC) 20 MG capsule Take 1 capsule (20 mg total) by mouth daily. 08/02/16   Fritzi Mandes, MD  Potassium Chloride ER 20 MEQ TBCR TAKE 1 TABLET BY MOUTH ONCE DAILY FOR LOW POTASSIUM Patient not taking: Reported on 08/19/2021 01/25/21   [provider]  rosuvastatin (CRESTOR) 40 MG tablet Take 1 tablet (40 mg total) by mouth daily. 08/19/21 11/17/21  Minna Merritts, MD    Allergies Ciprofloxacin, Codeine, and Nsaids  Family History  Problem Relation Age of Onset   Diabetes Mother    Hypertension Mother    Diabetes Maternal Grandfather    Hypertension Maternal Grandfather    Breast cancer Neg Hx     Social History Social History   Tobacco Use   Smoking status: Former    Packs/day: 1.25    Years: 47.00    Pack years: 58.75    Types: Cigarettes    Quit date: 07/11/2016    Years since quitting: 5.1   Smokeless tobacco: Never  Vaping Use   Vaping Use: Never used  Substance Use Topics  Alcohol use: No   Drug use: No    Review of Systems   Constitutional: No fever/chills Eyes: Negative for visual changes. Positive for pain. Negative for drainage. Musculoskeletal: Negative for pain. Skin: Negative for rash. Neurological: Negative for headaches, focal weakness or numbness. Allergic: Negative for seasonal allergies. ____________________________________________  PHYSICAL EXAM:  VITAL SIGNS: ED Triage Vitals  Enc Vitals Group     BP 08/27/21 1812 (!) 136/58     Pulse Rate 08/27/21 1812 89     Resp 08/27/21 1812 18     Temp 08/27/21 1812 98.2 F (36.8 C)     Temp Source 08/27/21 1812 Oral     SpO2 08/27/21 1812 95 %     Weight 08/27/21 1704 172 lb 4.8 oz (78.2 kg)      Height 08/27/21 1704 5\' 3"  (1.6 m)     Head Circumference --      Peak Flow --      Pain Score --      Pain Loc --      Pain Edu? --      Excl. in [place]? --     Constitutional: Alert and oriented. Well appearing and in no acute distress. Eyes: Visual acuity--see nursing documentation; no globe trauma; Eyelids normal to inspection; Sclera appears anicteric.  Eyelids not inverted. Conjunctiva appears clear; Cornea normal. EOMI. PERRLA. Head: Atraumatic. Nose: No congestion/rhinnorhea. Mouth/Throat: Mucous membranes are moist.  Oropharynx non-erythematous. Respiratory: Respirations even and unlabored. Breath sounds clear to auscultation. Musculoskeletal:Normal ROM x 4 extremities. Neurologic:  Normal speech and language. No gross focal neurologic deficits are appreciated. Speech is normal. No gait instability. Skin:  Skin is warm, dry and intact. No rash noted. Psychiatric: Mood and affect are normal. Speech and behavior are normal.  ____________________________________________   LABS (all labs ordered are listed, but only abnormal results are displayed)  Labs Reviewed  BASIC METABOLIC PANEL - Abnormal; Notable for the following components:      Result Value   Glucose, Bld 114 (*)    All other components within normal limits  CBC WITH DIFFERENTIAL/PLATELET - Abnormal; Notable for the following components:   RBC 3.75 (*)    Hemoglobin 11.3 (*)    HCT 34.5 (*)    Platelets 111 (*)    All other components within normal limits  CBC WITH DIFFERENTIAL/PLATELET   ____________________________________________  EKG  Not indicated ____________________________________________  RADIOLOGY  Not indicated ____________________________________________   PROCEDURES  Procedure(s) performed: None ____________________________________________   INITIAL IMPRESSION / ASSESSMENT AND PLAN / ED COURSE  65 year old female presents with 2-3 days of swelling around right eye. See  HPI.  Concerned for cellulitis. Will get labs and CT.  CT indicates periorbital cellulitis without postseptal cellulitis. Will treat with Augmentin. First dose given in ER.   ER return precautions given. Will also provide information for ophthalmology on call. She is to call for appointment if not improving over the next 2 days. If unable to be seen, she is to return to the ER.  Pertinent labs & imaging results that were available during my care of the patient were reviewed by me and considered in my medical decision making (see chart for details). ____________________________________________   FINAL CLINICAL IMPRESSION(S) / ED DIAGNOSES  Final diagnoses:  Periorbital cellulitis of right eye    Note:  This document was prepared using Dragon voice recognition software and may include unintentional dictation errors.    Victorino Dike, FNP 08/29/21 1205    Cheri Fowler,  Vista Lawman, MD 08/29/21 Vernelle Emerald

## 2021-08-27 NOTE — ED Notes (Signed)
Visual Acuity Snellen chart 20/25

## 2021-08-27 NOTE — ED Triage Notes (Signed)
C/O right eye swelling.  Right eye swelling noted, slightly reddened.

## 2021-08-29 ENCOUNTER — Other Ambulatory Visit: Payer: Self-pay

## 2021-08-29 ENCOUNTER — Inpatient Hospital Stay
Admission: EM | Admit: 2021-08-29 | Discharge: 2021-09-02 | DRG: 603 | Disposition: A | Discharge status: Home / Self Care | Payer: BC Managed Care – PPO | Attending: Internal Medicine | Admitting: Internal Medicine

## 2021-08-29 ENCOUNTER — Emergency Department: Payer: BC Managed Care – PPO

## 2021-08-29 DIAGNOSIS — E039 Hypothyroidism, unspecified: Secondary | ICD-10-CM | POA: Diagnosis present

## 2021-08-29 DIAGNOSIS — D589 Hereditary hemolytic anemia, unspecified: Secondary | ICD-10-CM | POA: Diagnosis present

## 2021-08-29 DIAGNOSIS — M329 Systemic lupus erythematosus, unspecified: Secondary | ICD-10-CM | POA: Diagnosis present

## 2021-08-29 DIAGNOSIS — Z87891 Personal history of nicotine dependence: Secondary | ICD-10-CM

## 2021-08-29 DIAGNOSIS — K219 Gastro-esophageal reflux disease without esophagitis: Secondary | ICD-10-CM | POA: Diagnosis present

## 2021-08-29 DIAGNOSIS — Z7902 Long term (current) use of antithrombotics/antiplatelets: Secondary | ICD-10-CM | POA: Diagnosis not present

## 2021-08-29 DIAGNOSIS — D696 Thrombocytopenia, unspecified: Secondary | ICD-10-CM | POA: Diagnosis present

## 2021-08-29 DIAGNOSIS — Z8249 Family history of ischemic heart disease and other diseases of the circulatory system: Secondary | ICD-10-CM | POA: Diagnosis not present

## 2021-08-29 DIAGNOSIS — N1831 Chronic kidney disease, stage 3a: Secondary | ICD-10-CM | POA: Diagnosis present

## 2021-08-29 DIAGNOSIS — H11441 Conjunctival cysts, right eye: Secondary | ICD-10-CM | POA: Diagnosis present

## 2021-08-29 DIAGNOSIS — L03213 Periorbital cellulitis: Principal | ICD-10-CM | POA: Diagnosis present

## 2021-08-29 DIAGNOSIS — I2510 Atherosclerotic heart disease of native coronary artery without angina pectoris: Secondary | ICD-10-CM | POA: Diagnosis present

## 2021-08-29 DIAGNOSIS — Z886 Allergy status to analgesic agent status: Secondary | ICD-10-CM

## 2021-08-29 DIAGNOSIS — I129 Hypertensive chronic kidney disease with stage 1 through stage 4 chronic kidney disease, or unspecified chronic kidney disease: Secondary | ICD-10-CM | POA: Diagnosis present

## 2021-08-29 DIAGNOSIS — E669 Obesity, unspecified: Secondary | ICD-10-CM | POA: Diagnosis present

## 2021-08-29 DIAGNOSIS — Z8619 Personal history of other infectious and parasitic diseases: Secondary | ICD-10-CM

## 2021-08-29 DIAGNOSIS — IMO0002 Reserved for concepts with insufficient information to code with codable children: Secondary | ICD-10-CM | POA: Diagnosis present

## 2021-08-29 DIAGNOSIS — Z20822 Contact with and (suspected) exposure to covid-19: Secondary | ICD-10-CM | POA: Diagnosis present

## 2021-08-29 DIAGNOSIS — N183 Chronic kidney disease, stage 3 unspecified: Secondary | ICD-10-CM | POA: Diagnosis present

## 2021-08-29 DIAGNOSIS — Z6830 Body mass index (BMI) 30.0-30.9, adult: Secondary | ICD-10-CM | POA: Diagnosis not present

## 2021-08-29 DIAGNOSIS — I739 Peripheral vascular disease, unspecified: Secondary | ICD-10-CM | POA: Diagnosis present

## 2021-08-29 DIAGNOSIS — Z885 Allergy status to narcotic agent status: Secondary | ICD-10-CM | POA: Diagnosis not present

## 2021-08-29 DIAGNOSIS — E785 Hyperlipidemia, unspecified: Secondary | ICD-10-CM | POA: Diagnosis present

## 2021-08-29 DIAGNOSIS — Z881 Allergy status to other antibiotic agents status: Secondary | ICD-10-CM

## 2021-08-29 DIAGNOSIS — J449 Chronic obstructive pulmonary disease, unspecified: Secondary | ICD-10-CM | POA: Diagnosis present

## 2021-08-29 DIAGNOSIS — Z79899 Other long term (current) drug therapy: Secondary | ICD-10-CM

## 2021-08-29 DIAGNOSIS — Z79890 Hormone replacement therapy (postmenopausal): Secondary | ICD-10-CM | POA: Diagnosis not present

## 2021-08-29 DIAGNOSIS — R229 Localized swelling, mass and lump, unspecified: Secondary | ICD-10-CM | POA: Diagnosis present

## 2021-08-29 DIAGNOSIS — I10 Essential (primary) hypertension: Secondary | ICD-10-CM | POA: Diagnosis present

## 2021-08-29 LAB — CBC
HCT: 39.4 % (ref 36.0–46.0)
Hemoglobin: 12.5 g/dL (ref 12.0–15.0)
MCH: 28.9 pg (ref 26.0–34.0)
MCHC: 31.7 g/dL (ref 30.0–36.0)
MCV: 91 fL (ref 80.0–100.0)
Platelets: 145 10*3/uL — ABNORMAL LOW (ref 150–400)
RBC: 4.33 MIL/uL (ref 3.87–5.11)
RDW: 14.2 % (ref 11.5–15.5)
WBC: 8.6 10*3/uL (ref 4.0–10.5)
nRBC: 0 % (ref 0.0–0.2)

## 2021-08-29 LAB — RESP PANEL BY RT-PCR (FLU A&B, COVID) ARPGX2
Influenza A by PCR: NEGATIVE
Influenza B by PCR: NEGATIVE
SARS Coronavirus 2 by RT PCR: NEGATIVE

## 2021-08-29 LAB — BASIC METABOLIC PANEL
Anion gap: 7 (ref 5–15)
BUN: 23 mg/dL (ref 8–23)
CO2: 31 mmol/L (ref 22–32)
Calcium: 9.5 mg/dL (ref 8.9–10.3)
Chloride: 101 mmol/L (ref 98–111)
Creatinine, Ser: 1.46 mg/dL — ABNORMAL HIGH (ref 0.44–1.00)
GFR, Estimated: 40 mL/min — ABNORMAL LOW (ref >60)
Glucose, Bld: 76 mg/dL (ref 70–99)
Potassium: 3.7 mmol/L (ref 3.5–5.1)
Sodium: 139 mmol/L (ref 135–145)

## 2021-08-29 LAB — HIV ANTIBODY (ROUTINE TESTING W REFLEX): HIV Screen 4th Generation wRfx: NONREACTIVE

## 2021-08-29 MED ORDER — VANCOMYCIN HCL 2000 MG/400ML IV SOLN
2000.0000 mg | Freq: Once | INTRAVENOUS | Status: DC
  Filled 2021-08-29: qty 400

## 2021-08-29 MED ORDER — OXYCODONE HCL 5 MG PO TABS: 5.0000 mg | ORAL_TABLET | ORAL | Status: DC | PRN

## 2021-08-29 MED ORDER — ONDANSETRON HCL 4 MG PO TABS: 4.0000 mg | ORAL_TABLET | Freq: Four times a day (QID) | ORAL | Status: DC | PRN

## 2021-08-29 MED ORDER — LEVOTHYROXINE SODIUM 50 MCG PO TABS
75.0000 ug | ORAL_TABLET | Freq: Every day | ORAL | Status: DC
  Administered 2021-08-30 – 2021-09-02 (×4): 75 ug via ORAL
  Filled 2021-08-29 (×4): qty 2

## 2021-08-29 MED ORDER — IOHEXOL 300 MG/ML  SOLN
75.0000 mL | Freq: Once | INTRAMUSCULAR | Status: AC | PRN
  Administered 2021-08-29: 17:00:00 75 mL via INTRAVENOUS
  Filled 2021-08-29: qty 75

## 2021-08-29 MED ORDER — ONDANSETRON HCL 4 MG/2ML IJ SOLN: 4.0000 mg | Freq: Four times a day (QID) | INTRAMUSCULAR | Status: DC | PRN

## 2021-08-29 MED ORDER — ACETAMINOPHEN 650 MG RE SUPP: 650.0000 mg | Freq: Four times a day (QID) | RECTAL | Status: DC | PRN

## 2021-08-29 MED ORDER — ACETAMINOPHEN 325 MG PO TABS
650.0000 mg | ORAL_TABLET | Freq: Four times a day (QID) | ORAL | Status: DC | PRN
  Administered 2021-09-01: 650 mg via ORAL
  Filled 2021-08-29: qty 2

## 2021-08-29 MED ORDER — CLOPIDOGREL BISULFATE 75 MG PO TABS
75.0000 mg | ORAL_TABLET | Freq: Every day | ORAL | Status: DC
  Administered 2021-08-30 – 2021-09-02 (×4): 75 mg via ORAL
  Filled 2021-08-29 (×4): qty 1

## 2021-08-29 MED ORDER — ROSUVASTATIN CALCIUM 10 MG PO TABS
40.0000 mg | ORAL_TABLET | Freq: Every evening | ORAL | Status: DC
  Administered 2021-08-30 – 2021-09-01 (×3): 40 mg via ORAL
  Filled 2021-08-29 (×3): qty 4

## 2021-08-29 MED ORDER — SODIUM CHLORIDE 0.9 % IV SOLN: 1.0000 g | INTRAVENOUS | Status: DC

## 2021-08-29 MED ORDER — ALLOPURINOL 100 MG PO TABS
100.0000 mg | ORAL_TABLET | Freq: Every day | ORAL | Status: DC
  Administered 2021-08-30 – 2021-09-02 (×4): 100 mg via ORAL
  Filled 2021-08-29 (×4): qty 1

## 2021-08-29 MED ORDER — SODIUM CHLORIDE 0.9 % IV SOLN
1.0000 g | INTRAVENOUS | Status: DC
  Administered 2021-08-30 – 2021-09-01 (×3): 1 g via INTRAVENOUS
  Filled 2021-08-29 (×4): qty 10

## 2021-08-29 MED ORDER — SODIUM CHLORIDE 0.9 % IV SOLN
1.0000 g | Freq: Once | INTRAVENOUS | Status: AC
  Administered 2021-08-29: 20:00:00 1 g via INTRAVENOUS
  Filled 2021-08-29: qty 10

## 2021-08-29 MED ORDER — SODIUM CHLORIDE 0.9 % IV BOLUS
500.0000 mL | Freq: Once | INTRAVENOUS | Status: AC
  Administered 2021-08-29: 20:00:00 500 mL via INTRAVENOUS

## 2021-08-29 NOTE — Assessment & Plan Note (Signed)
Cr baseline appears to be between 0.9-1.4 over the last few months.  Follows with Dr. Holley Raring. - IV fluids given soft BP on admit - Trend Cr

## 2021-08-29 NOTE — ED Notes (Signed)
Informed RN bed assigned 

## 2021-08-29 NOTE — Assessment & Plan Note (Addendum)
Extraocular movements normal, CT unremarkable.  Dr. Starleen Blue and I think there may be a fluid collection not seen on CT.  BP soft but NO sepsis physiology.  - Stop Augmentin - Start Rocephin, add MRSA coverage/vancomycin  -Consult ophthalmology, appreciate cares, Dr. Wallace Going

## 2021-08-29 NOTE — Progress Notes (Signed)
Patient's daughter told this RN that her Mother needed to be hooked up to her IV. Informed daughter that patient is not scheduled for anything IV at this time and does not need to be hooked up to her IV for anything until dayshift. FYI - during this time patient came up at shift change with this RN being assigned 6 patients, one brand new, one going EMTALA to [facility], one who pulled out her IV who didn't have a way to turn off her light above her head and wanted to go to the bathroom immediately, and one with COVID with request to brush his teeth and get a bath, and Telemetry calling back to back along with Charge and Secretary calling regarding the very same thing.

## 2021-08-29 NOTE — Hospital Course (Addendum)
Tina Page is a 65 y.o. F with SLE on Plaquenil, hx AIHA, PVD s/p bilateral iliac stents 2017, HTN, COPD not on O2, CKD IIIa baseline 1.0-1.4, hep C Ab positive, hx Cdiff remote, and hypothyroidism who presented with preseptal cellulitis failed outpatient therapy.  Patient woke 3 days ago with redness and swelling and pain around her right eye at the site of a small "pimple".  She went to the ER, imaging unremarkable, she was discharged with Augmentin for preseptal cellulitis.  Over the last 2 days she took the medication but her redness, swelling, and pain have gotten worse so she came back to the ER.  In the ER: - afebrile, WBC normal - blood pressure somewhat soft on admission, resolved spontaneously - eye movements without pain, diplopia - Started on Rocephin and vancomycin - CT orbits was repeated showed no fluid collection, no evidence of orbital cellulitis.

## 2021-08-29 NOTE — Assessment & Plan Note (Signed)
Not on meds, not on O2, no symptoms here

## 2021-08-29 NOTE — Assessment & Plan Note (Signed)
-  Hold Plaquenil for now

## 2021-08-29 NOTE — Assessment & Plan Note (Signed)
BP soft on admit -Hold lisinopril HCTZ until hemodynamics clearer

## 2021-08-29 NOTE — H&P (Signed)
History and Physical    Patient: Tina Page [id number] DOB: Feb 27, 1956 DOA: 08/29/2021 DOS: the patient was seen and examined on 08/29/2021 PCP: Frazier Richards, MD  Patient coming from: Home  Chief Complaint: RIGHT eye swelling, failed outpatient treatment     HPI:  Mrs. Wolman is a 65 y.o. F with SLE on Plaquenil, hx AIHA, PVD s/p bilateral iliac stents 2017, HTN, COPD not on O2, hx Cdiff remote, and hypothyroidism who presented with preseptal cellulitis failed outpatient therapy.  Patient woke 3 days ago with redness and swelling and pain around her right eye at the site of a small "pimple".  She went to the ER, imaging unremarkable, she was discharged with Augmentin for preseptal cellulitis.  Over the last 2 days she took the medication but her redness, swelling, and pain have gotten worse so she came back to the ER.  In the ER: - afebrile, WBC normal - blood pressure somewhat soft on admission, resolved spontaneously - eye movements without pain, diplopia - Started on Rocephin and vancomycin - CT orbits was repeated showed no fluid collection, no evidence of orbital cellulitis.        Review of Systems: Review of Systems  Constitutional:  Negative for chills and fever.  HENT:         Right eye swelling, complete by right eye, pain around eye, without pain with eye movements.  Conjunctiva discharge.  All other systems reviewed and are negative.     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   Elevated liver function tests 11/03/2017   GERD (gastroesophageal reflux disease)    Heme positive stool 01/29/2015   Hepatitis C 07/12/2016   Ab positive, RNA negative   HLD (hyperlipidemia)    Hypertension    Hypothyroidism    Mass of middle  lobe of right lung 11/23/2017   Post PTCA 07/12/2016   iliac stents bilaterally 2017   Thrombocytopenia ([place]) 10/04/2016   Wears dentures    full upper and lower   Past Surgical History:  Procedure Laterality Date   ELECTROMAGNETIC NAVIGATION BROCHOSCOPY N/A 12/07/2017   Procedure: ELECTROMAGNETIC NAVIGATION BRONCHOSCOPY;  Surgeon: Flora Lipps, MD;  Location: [facility] ORS;  Service: Cardiopulmonary;  Laterality: N/A;   ESOPHAGOGASTRODUODENOSCOPY (EGD) WITH PROPOFOL N/A 07/08/2016   Procedure: ESOPHAGOGASTRODUODENOSCOPY (EGD) WITH PROPOFOL;  Surgeon: Manya Silvas, MD;  Location[facility] ENDOSCOPY;  Service: Endoscopy;  Laterality: N/A;   KNEE SURGERY Right    repair of acl tear   PERIPHERAL VASCULAR CATHETERIZATION N/A 07/10/2016   Procedure: Lower Extremity Angiography;  Surgeon: Katha Cabal, MD;  Location: [place] CV LAB;  Service: Cardiovascular;  Laterality: N/A;   PERIPHERAL VASCULAR CATHETERIZATION N/A 07/10/2016   Procedure: Abdominal Aortogram w/Lower Extremity;  Surgeon: Katha Cabal, MD;  Location: [place] [facility] LAB;  Service: Cardiovascular;  Laterality: N/A;   PERIPHERAL VASCULAR CATHETERIZATION  07/10/2016   Procedure: Lower Extremity Intervention;  Surgeon: Katha Cabal, MD;  Location: [place] [facility] LAB;  Service: Cardiovascular;;   Social History:  reports that she quit smoking about 5 years ago. Her smoking use included cigarettes. She has a 58.75 pack-year smoking history. She has never used smokeless tobacco. She reports that she does not drink alcohol and does not use drugs.  Allergies  Allergen Reactions  Ciprofloxacin Swelling    Facial swelling following single oral dose.    Codeine Anaphylaxis   Nsaids Other (See Comments)    Patient has Hep C.     Family History  Problem Relation Age of Onset   Diabetes Mother    Hypertension Mother    Diabetes Maternal Grandfather    Hypertension Maternal Grandfather    Breast cancer Neg Hx      Prior to Admission medications   Medication Sig Start Date End Date Taking? Authorizing Provider  albuterol (PROVENTIL HFA;VENTOLIN HFA) 108 (90 Base) MCG/ACT inhaler Inhale 2 puffs into the lungs every 6 (six) hours as needed for wheezing or shortness of breath.     [provider]  allopurinol (ZYLOPRIM) 100 MG tablet TAKE 1 TABLET BY MOUTH EVERY DAY 06/02/19   Lequita Asal, MD  clopidogrel (PLAVIX) 75 MG tablet Take 1 tablet (75 mg total) by mouth daily. 07/13/16   Theodoro Grist, MD  cyanocobalamin 500 MCG tablet Take 500 mcg by mouth daily.    [provider]  diclofenac Sodium (VOLTAREN) 1 % GEL Apply 2 g topically 4 (four) times daily. 07/18/20   Ezekiel Slocumb, DO  entecavir (BARACLUDE) 0.5 MG tablet Take 0.5 mg by mouth every other day. Patient not taking: Reported on 08/19/2021    [provider]  folic acid (FOLVITE) 1 MG tablet TAKE 1 TABLET (1 MG TOTAL) BY MOUTH DAILY. 08/29/18   Lequita Asal, MD  hydroxychloroquine (PLAQUENIL) 200 MG tablet Take 1 tablet by mouth 2 (two) times daily. 07/17/21   [provider]  levothyroxine (SYNTHROID, LEVOTHROID) 75 MCG tablet Take 75 mcg by mouth daily before breakfast.  12/30/17   [provider]  lisinopril-hydrochlorothiazide (ZESTORETIC) 20-25 MG tablet Take 1 tablet by mouth daily. 08/19/21   Minna Merritts, MD  omeprazole (PRILOSEC) 20 MG capsule Take 1 capsule (20 mg total) by mouth daily. 08/02/16   Fritzi Mandes, MD  Potassium Chloride ER 20 MEQ TBCR TAKE 1 TABLET BY MOUTH ONCE DAILY FOR LOW POTASSIUM Patient not taking: Reported on 08/19/2021 01/25/21   [provider]  rosuvastatin (CRESTOR) 40 MG tablet Take 1 tablet (40 mg total) by mouth daily. 08/19/21 11/17/21  Minna Merritts, MD    Physical Exam: Vitals:   08/29/21 1217 08/29/21 1424 08/29/21 1504  BP: (!) 90/54  (!) 105/57  Pulse: 95  76  Resp: 18  16  Temp: 98 F (36.7 C)  98.6 F (37 C)   TempSrc:   Oral  SpO2: 99%  96%  Weight:  78.1 kg   Height:  5\' 3"  (1.6 m)    General appearance: Adult female, lying in bed, no acute distress, interactive.     HEENT: There is redness and swelling of the right upper eyelid and periorbital tissues.  There is a small whitish area that looks like a pustule.  There is some discharge from the conjunctive a.  There is no redness of the eye, eye movements are normal.  Dentures in place, lips normal, oropharynx moist, no oral lesions. Skin:   Cardiac: RRR, no murmurs, no lower extremity edema Respiratory: Normal respiratory rate and rhythm, lungs clear without rales or wheezes Abdomen: Abdomen soft without tenderness palpation or guarding, no ascites or distention MSK: Normal muscle bulk and tone Neuro: Awake and alert, extraocular movements intact, moves all extremities with normal strength and coordination, speech fluent, face symmetric except for right swelling Psych: Attention normal, affect normal, judgment insight  appear normal   Data Reviewed: Labs notable for normal complete blood count, normal electrolytes, renal function at baseline.         Assessment/Plan * Preseptal cellulitis of right eye- (present on admission) Extraocular movements normal, CT unremarkable.  Dr. Starleen Blue and I think there may be a fluid collection not seen on CT.  BP soft but NO sepsis physiology.  - Stop Augmentin - Start Rocephin, add MRSA coverage/vancomycin  -Consult ophthalmology, appreciate cares, Dr. Wallace Going  Lupus [facility])- (present on admission) -Hold Plaquenil for now  Stage 3a chronic kidney disease (CKD) ([place])- (present on admission) Cr baseline appears to be between 0.9-1.4 over the last few months.  Follows with Dr. Holley Raring. - IV fluids given soft BP on admit - Trend Cr  PAD (peripheral artery disease) ([place])- (present on admission) -Continue Plavix and Crestor  Essential hypertension- (present on admission) BP soft on admit -Hold  lisinopril HCTZ until hemodynamics clearer  COPD (chronic obstructive pulmonary disease) ([place])- (present on admission) Not on meds, not on O2, no symptoms here  Hypothyroidism- (present on admission) -Continue levothyroxine             Advance Care Planning:   Code Status: Full Code    Consults: Ophthalmology, Dr. Wallace Going, will evaluate the patient tomorrow  Family Communication: Daughter at the bedside  Severity of Illness: The appropriate patient status for this patient is INPATIENT. Inpatient status is judged to be reasonable and necessary in order to provide the required intensity of service to ensure the patient's safety. The patient's presenting symptoms, physical exam findings, and initial radiographic and laboratory data in the context of their chronic comorbidities is felt to place them at high risk for further clinical deterioration. Furthermore, it is not anticipated that the patient will be medically stable for discharge from the hospital within 2 midnights of admission.   * I certify that at the point of admission it is my clinical judgment that the patient will require inpatient hospital care spanning beyond 2 midnights from the point of admission due to high intensity of service, high risk for further deterioration and high frequency of surveillance required.*  Author: Suann Larry Gizel Riedlinger 08/29/2021 6:01 PM  For on call review [email or web address].

## 2021-08-29 NOTE — Consult Note (Signed)
Pharmacy Antibiotic Note  Tina Page is a 65 y.o. female admitted on 08/29/2021 with Preseptal cellulitis of right eye a/w conjunctiva discharge after worsening on outpatient Augmentin treatment (08/28/2021-09/08/2021).  Pharmacy has been consulted for Vancomycin dosing.  Plan: Scr: 0.94 (12/28) > 1.46 (12/30) Pt also receiving Ceftriaxone. Only received 1 day of augment PTA so may not need full MRSA coverage. F/u MRSA PCR and pt response to abx.  Vancomycin 2g x1 (~25mg /kg loading dose).  Planning 7-days of therapy per consult. Ordered MRSA PCR Per current renal function pt is q36-q48h interval dosing with elevated Scr. Will need to f/u renal function AM if scheduled regimen vs dose-by-levels is more appropriate.   Height: 5\' 3"  (160 cm) Weight: 78.1 kg (172 lb 2.9 oz) IBW/kg (Calculated) : 52.4  Temp (24hrs), Avg:98.3 F (36.8 C), Min:98 F (36.7 C), Max:98.6 F (37 C)  Recent Labs  Lab 08/27/21 1939 08/27/21 2056 08/29/21 1218  WBC  --  6.2 8.6  CREATININE 0.94  --  1.46*    Estimated Creatinine Clearance: 39 mL/min (A) (by C-G formula based on SCr of 1.46 mg/dL (H)).    Allergies  Allergen Reactions   Ciprofloxacin Swelling    Facial swelling following single oral dose.    Codeine Anaphylaxis   Nsaids Other (See Comments)    Patient has Hep C.     Antimicrobials this admission: Vancomycin & Ceftriaxone ( 12/30 >>   Dose adjustments this admission: CTM ISO AKI.   Microbiology results: 12/30 BCx: sent 12/30: Flu/Co: sent  12/30 MRSA PCR: ordered  Thank you for allowing pharmacy to be a part of this patients care.  Shanon Brow Capricia Serda 08/29/2021 6:42 PM

## 2021-08-29 NOTE — ED Triage Notes (Signed)
Patient to ER for periorbital cellulitis, has been on Augmentin, but swelling is worse.

## 2021-08-29 NOTE — Progress Notes (Signed)
Lab called to collect blood cultures.

## 2021-08-29 NOTE — Assessment & Plan Note (Signed)
Continue levothyroxine 

## 2021-08-29 NOTE — ED Notes (Signed)
ED Provider at bedside. 

## 2021-08-29 NOTE — ED Triage Notes (Signed)
Pt comes with c/o right eye pain. Pt states she has been on oral meds since Wednesday. Pt's eye is red swollen and not any better.  PCP called and states pt has periorbital cellulitis

## 2021-08-29 NOTE — ED Provider Notes (Signed)
[facility]  ____________________________________________   Event Date/Time   First MD Initiated Contact with Patient 08/29/21 1410     (approximate)  I have reviewed the triage vital signs and the nursing notes.   HISTORY  Chief Complaint Eye Pain    HPI Tina Page is a 65 y.o. female with past medical history of hypertension, hyperlipidemia, GERD, COPD who presents with eye swelling and pain.  Patient seen in the ED 2 days ago and started on Augmentin for periorbital cellulitis.  During the ED visit she had a CT scan that was negative for orbital cellulitis.  Since that time she has had progressive swelling and pain.  She notes that her vision is slightly blurry but thinks this is because of the tearing.  Denies pain with extraocular movements.  No fevers chills.  Otherwise has been well.  Has been compliant with Augmentin.  She saw her PCP today who encouraged her to come to the emergency department for evaluation.         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   Elevated liver function tests 11/03/2017   GERD (gastroesophageal reflux disease)    Heme positive stool 01/29/2015   Hepatitis C 07/12/2016   HLD (hyperlipidemia)    Hypertension    Hypothyroidism    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/23/2017   Post PTCA 07/12/2016   Thrombocytopenia ([place]) 10/04/2016   Wears dentures    full upper and lower    Patient Active Problem List   Diagnosis Date Noted   Leukopenia 11/22/2020   Herpes zoster without complication    Left leg pain 11/17/2020   Diarrhea 09/01/2020   Clostridium difficile diarrhea 08/26/2020   Lupus ([place]) 08/11/2020   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[place]) 08/11/2020   Chronic viral hepatitis B  without delta agent and without coma ([place]) 08/11/2020   Papular rash 08/05/2020   Bacteremia due to Escherichia coli 07/17/2020   UTI (urinary tract infection) 07/16/2020   Hypothyroidism    Fever    Encounter for antineoplastic immunotherapy 07/15/2020   Goals of care, counseling/discussion 06/19/2020   Abnormal CT of the chest 06/04/2020   Oral candidiasis 06/04/2020   Former smoker 06/04/2020   Pulmonary nodules 12/28/2019   Chronic kidney disease, stage 3 unspecified ([place]) 05/15/2019   Postop check 04/24/2019   Paronychia of great toe of right foot 04/17/2019   Ingrowing nail 04/17/2019   Medication monitoring encounter 03/25/2018   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   Elevated liver function tests 11/03/2017   Elevated uric acid in blood 11/03/2017   Low serum cortisol level 01/04/2017   Elevated alkaline phosphatase level 10/25/2016   Oral herpes simplex infection 10/25/2016   Current chronic use of systemic steroids 10/23/2016   Impetigo 10/23/2016   Thrombocytopenia ([place]) 10/04/2016   B12 deficiency 08/06/2016   Symptomatic anemia 07/21/2016   Atherosclerosis of native arteries of extremity with intermittent claudication ([place]) 07/20/2016   PAD (peripheral artery  disease) ([place]) 07/12/2016   Post PTCA 07/12/2016   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   Autoimmune hemolytic anemia, 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   Hepatitis B core antibody positive 07/12/2016   Tobacco abuse 07/12/2016   Leg pain 07/07/2016   Essential hypertension 07/07/2016   HLD (hyperlipidemia) 07/07/2016   COPD (chronic obstructive pulmonary disease) ([place])  07/07/2016   Acute kidney injury superimposed on CKD ([place]) 07/07/2016   Heme positive stool 01/29/2015    Past Surgical History:  Procedure Laterality Date   ELECTROMAGNETIC NAVIGATION BROCHOSCOPY N/A 12/07/2017   Procedure: ELECTROMAGNETIC NAVIGATION BRONCHOSCOPY;  Surgeon: Flora Lipps, MD;  Location: [facility] ORS;  Service: Cardiopulmonary;  Laterality: N/A;   ESOPHAGOGASTRODUODENOSCOPY (EGD) WITH PROPOFOL N/A 07/08/2016   Procedure: ESOPHAGOGASTRODUODENOSCOPY (EGD) WITH PROPOFOL;  Surgeon: Manya Silvas, MD;  Location: [facility] ENDOSCOPY;  Service: Endoscopy;  Laterality: N/A;   KNEE SURGERY Right    repair of acl tear   PERIPHERAL VASCULAR CATHETERIZATION N/A 07/10/2016   Procedure: Lower Extremity Angiography;  Surgeon: Katha Cabal, MD;  Location: [place] CV LAB;  Service: Cardiovascular;  Laterality: N/A;   PERIPHERAL VASCULAR CATHETERIZATION N/A 07/10/2016   Procedure: Abdominal Aortogram w/Lower Extremity;  Surgeon: Katha Cabal, MD;  Location: [place] CV LAB;  Service: Cardiovascular;  Laterality: N/A;   PERIPHERAL VASCULAR CATHETERIZATION  07/10/2016   Procedure: Lower Extremity Intervention;  Surgeon: Katha Cabal, MD;  Location: [place] CV LAB;  Service: Cardiovascular;;    Prior to Admission medications   Medication Sig Start Date End Date Taking? Authorizing Provider  albuterol (PROVENTIL HFA;VENTOLIN HFA) 108 (90 Base) MCG/ACT inhaler Inhale 2 puffs into the lungs every 6 (six) hours as needed for wheezing or shortness of breath.     [provider]  allopurinol (ZYLOPRIM) 100 MG tablet TAKE 1 TABLET BY MOUTH EVERY DAY 06/02/19   Lequita Asal, MD  amoxicillin-clavulanate (AUGMENTIN) 875-125 MG tablet Take 1 tablet by mouth 2 (two) times daily for 10 days. 08/27/21 09/06/21  Triplett, Johnette Abraham B, FNP  clopidogrel (PLAVIX) 75 MG tablet Take 1 tablet (75 mg total) by mouth daily. 07/13/16   Theodoro Grist, MD  cyanocobalamin 500 MCG tablet  Take 500 mcg by mouth daily.    [provider]  diclofenac Sodium (VOLTAREN) 1 % GEL Apply 2 g topically 4 (four) times daily. 07/18/20   Ezekiel Slocumb, DO  entecavir (BARACLUDE) 0.5 MG tablet Take 0.5 mg by mouth every other day. Patient not taking: Reported on 08/19/2021    [provider]  folic acid (FOLVITE) 1 MG tablet TAKE 1 TABLET (1 MG TOTAL) BY MOUTH DAILY. 08/29/18   Lequita Asal, MD  hydroxychloroquine (PLAQUENIL) 200 MG tablet Take 1 tablet by mouth 2 (two) times daily. 07/17/21   [provider]  levothyroxine (SYNTHROID, LEVOTHROID) 75 MCG tablet Take 75 mcg by mouth daily before breakfast.  12/30/17   [provider]  lisinopril-hydrochlorothiazide (ZESTORETIC) 20-25 MG tablet Take 1 tablet by mouth daily. 08/19/21   Minna Merritts, MD  omeprazole (PRILOSEC) 20 MG capsule Take 1 capsule (20 mg total) by mouth daily. 08/02/16   Fritzi Mandes, MD  Potassium Chloride ER 20 MEQ TBCR TAKE 1 TABLET BY MOUTH ONCE DAILY FOR LOW POTASSIUM Patient not taking: Reported on 08/19/2021 01/25/21   [provider]  rosuvastatin (CRESTOR) 40 MG tablet Take 1 tablet (40 mg total) by mouth daily. 08/19/21 11/17/21  Minna Merritts, MD    Allergies Ciprofloxacin, Codeine, and Nsaids  Family History  Problem Relation Age of Onset   Diabetes Mother    Hypertension Mother    Diabetes Maternal Grandfather    Hypertension Maternal Grandfather    Breast cancer Neg Hx     Social History Social History   Tobacco Use   Smoking status: Former    Packs/day: 1.25    Years: 47.00    Pack years: 58.75    Types: Cigarettes    Quit date: 07/11/2016    Years since quitting: 5.1   Smokeless tobacco: Never  Vaping Use   Vaping Use: Never used  Substance Use Topics   Alcohol use: No   Drug use: No    Review of Systems   Review of Systems  Eyes:  Positive for pain, discharge, redness and visual disturbance.  Skin:  Positive for color  change and rash.  All other systems reviewed and are negative.  Physical Exam Updated Vital Signs BP (!) 105/57 (BP Location: Left Arm)    Pulse 76    Temp 98.6 F (37 C) (Oral)    Resp 16    Ht 5\' 3"  (1.6 m)    Wt 78.1 kg    SpO2 96%    BMI 30.50 kg/m   Physical Exam Vitals and nursing note reviewed.  Constitutional:      General: She is not in acute distress.    Appearance: Normal appearance.  HENT:     Head: Normocephalic and atraumatic.  Eyes:     General: No scleral icterus.    Conjunctiva/sclera: Conjunctivae normal.     Comments: Significant erythema and swelling of the periorbital region, there is an area of fluctuance on the lateral aspect underneath the eyebrow, tender to palpation, no drainage, extraocular movements are intact, no pain with extraocular movements, pupils equal round reactive to light, normal conjunctiva Photo below  Pulmonary:     Effort: Pulmonary effort is normal. No respiratory distress.     Breath sounds: No stridor.  Musculoskeletal:        General: No deformity or signs of injury.     Cervical back: Normal range of motion.  Skin:    General: Skin is dry.     Coloration: Skin is not jaundiced or pale.  Neurological:     General: No focal deficit present.     Mental Status: She is alert and oriented to person, place, and time. Mental status is at baseline.  Psychiatric:        Mood and Affect: Mood normal.        Behavior: Behavior normal.        LABS (all labs ordered are listed, but only abnormal results are displayed)  Labs Reviewed  CBC - Abnormal; Notable for the following components:      Result Value   Platelets 145 (*)    All other components within normal limits  BASIC METABOLIC PANEL - Abnormal; Notable for the following components:   Creatinine, Ser 1.46 (*)    GFR, Estimated 40 (*)    All other components within normal limits  CULTURE, BLOOD (ROUTINE X 2)  CULTURE, BLOOD (ROUTINE X 2)  RESP PANEL BY RT-PCR (FLU A&B,  COVID) [id number]   ____________________________________________  EKG   ____________________________________________  RADIOLOGY Almeta Monas, personally viewed and evaluated these images (plain radiographs) as part of my medical decision making, as well as reviewing the written report by the radiologist.  ED MD interpretation:      ____________________________________________   PROCEDURES  Procedure(s) performed (including Critical Care):  Procedures   ____________________________________________   INITIAL IMPRESSION / ASSESSMENT AND PLAN / ED COURSE     65 year old female who presents with ongoing pain and worsening swelling after recent diagnosis of periorbital cellulitis.  On exam she has obvious swelling erythema of the periorbital region with an area of fluctuance on the lateral aspect.  I am concerned for developing abscess.  She has full extraocular movements and no pain with extraocular movements my concern for orbital cellulitis is low however we will repeat the CT scan to ensure that there is been no posterior spread and also to better evaluate for discrete abscess.  Will likely need to discuss with ophthalmology.  Patient's labs are overall reassuring.  Her initial blood pressures borderline low but on repeat it is within normal limits and she is otherwise asymptomatic.  Low suspicion for sepsis.  CT of the orbits does not show any discrete collection or post septal extension however does show progression of the cellulitis.  Given she has progressed on outpatient antibiotics will admit for IV antibiotics.  We will add MRSA coverage and treat with vancomycin and ceftriaxone.  Patient may benefit from an ophthalmology consult as although the CT does not show any obvious collection on exam there is fluctuance to me that may suggest that she needs up a drainage procedure.      ____________________________________________   FINAL CLINICAL IMPRESSION(S) / ED  DIAGNOSES  Final diagnoses:  Preseptal cellulitis     ED Discharge Orders     None        Note:  This document was prepared using Dragon voice recognition software and may include unintentional dictation errors.    Rada Hay, MD 08/29/21 [phone]

## 2021-08-29 NOTE — ED Provider Notes (Signed)
Emergency Medicine Provider Triage Evaluation Note  Tina Page , a 65 y.o. female  was evaluated in triage.  Pt complains of worsening right eye swelling. She is currently on Augmentin and has been taking as prescribed without improvement..  Review of Systems  Positive: Right eye swelling, pain Negative: Fever.  Physical Exam  BP (!) 90/54    Pulse 95    Temp 98 F (36.7 C)    Resp 18    SpO2 99%  Gen:   Awake, no distress   Resp:  Normal effort  MSK:   Moves extremities without difficulty  Other:    Medical Decision Making  Medically screening exam initiated at 12:26 PM.  Appropriate orders placed.  Tina Page was informed that the remainder of the evaluation will be completed by another provider, this initial triage assessment does not replace that evaluation, and the importance of remaining in the ED until their evaluation is complete.   Victorino Dike, FNP 08/29/21 1227    Harvest Dark, MD 08/29/21 1505

## 2021-08-29 NOTE — Assessment & Plan Note (Signed)
-  Continue Plavix and Crestor 

## 2021-08-30 DIAGNOSIS — M329 Systemic lupus erythematosus, unspecified: Secondary | ICD-10-CM

## 2021-08-30 DIAGNOSIS — N1831 Chronic kidney disease, stage 3a: Secondary | ICD-10-CM | POA: Diagnosis not present

## 2021-08-30 DIAGNOSIS — L03213 Periorbital cellulitis: Secondary | ICD-10-CM | POA: Diagnosis not present

## 2021-08-30 LAB — BASIC METABOLIC PANEL
Anion gap: 9 (ref 5–15)
BUN: 27 mg/dL — ABNORMAL HIGH (ref 8–23)
CO2: 26 mmol/L (ref 22–32)
Calcium: 8.8 mg/dL — ABNORMAL LOW (ref 8.9–10.3)
Chloride: 103 mmol/L (ref 98–111)
Creatinine, Ser: 1.18 mg/dL — ABNORMAL HIGH (ref 0.44–1.00)
GFR, Estimated: 52 mL/min — ABNORMAL LOW (ref >60)
Glucose, Bld: 98 mg/dL (ref 70–99)
Potassium: 4.1 mmol/L (ref 3.5–5.1)
Sodium: 138 mmol/L (ref 135–145)

## 2021-08-30 LAB — CBC
HCT: 35.7 % — ABNORMAL LOW (ref 36.0–46.0)
Hemoglobin: 11.5 g/dL — ABNORMAL LOW (ref 12.0–15.0)
MCH: 29.2 pg (ref 26.0–34.0)
MCHC: 32.2 g/dL (ref 30.0–36.0)
MCV: 90.6 fL (ref 80.0–100.0)
Platelets: 127 10*3/uL — ABNORMAL LOW (ref 150–400)
RBC: 3.94 MIL/uL (ref 3.87–5.11)
RDW: 14.2 % (ref 11.5–15.5)
WBC: 6.7 10*3/uL (ref 4.0–10.5)
nRBC: 0 % (ref 0.0–0.2)

## 2021-08-30 LAB — HIV ANTIBODY (ROUTINE TESTING W REFLEX): HIV Screen 4th Generation wRfx: NONREACTIVE

## 2021-08-30 MED ORDER — IPRATROPIUM-ALBUTEROL 0.5-2.5 (3) MG/3ML IN SOLN: 3.0000 mL | Freq: Four times a day (QID) | RESPIRATORY_TRACT | Status: DC | PRN

## 2021-08-30 MED ORDER — VANCOMYCIN HCL 2000 MG/400ML IV SOLN
2000.0000 mg | Freq: Once | INTRAVENOUS | Status: AC
  Administered 2021-08-30: 2000 mg via INTRAVENOUS
  Filled 2021-08-30: qty 400

## 2021-08-30 MED ORDER — VANCOMYCIN HCL 750 MG/150ML IV SOLN
750.0000 mg | INTRAVENOUS | Status: DC
  Administered 2021-08-31 – 2021-09-02 (×3): 750 mg via INTRAVENOUS
  Filled 2021-08-30 (×3): qty 150

## 2021-08-30 NOTE — Progress Notes (Signed)
Tina Page  INO:[id number] DOB: Jul 15, 1956 DOA: 08/29/2021 PCP: Frazier Richards, MD   Assessment & Plan:   Principal Problem:   Preseptal cellulitis of right eye Active Problems:   Essential hypertension   COPD (chronic obstructive pulmonary disease) (HCC)   PAD (peripheral artery disease) (HCC)   Hypothyroidism   Stage 3a chronic kidney disease (CKD) ([place])   Lupus ([place])   Periorbital cellulitis: continue on IV rocephin, vanco. CT orbits shows right preseptal/periorbital soft tissue edema, compatible w/ cellulitis. Ophthalmology recs apprec. F/u 2 weeks or prn w/ [place] eye   Cyst: of right lateral canthus as per ophthalmology. Observe as per ophtha   Lupus: continue to hold home dose of plaquenil    CKDIIIa: Cr is trending down from day prior.    PAD: continue on statin, plavix    HTN: continue to hold lisinopril, HCTZ as BP is low normal    COPD: w/o exacerbation. Continue on bronchodilators    Hypothyroidism: continue on levothyroxine   Thrombocytopenia: etiology unclear. Will continue to monitor   Obesity: BMI 30.5. Would benefit from weight loss   DVT prophylaxis: SCDs Code Status: full  Family Communication: discussed pt's care w/ pt's daughter, [name], and answered her questions  Disposition Plan: likely d/c back home   Level of care: Med-Surg  Status is: Inpatient  Remains inpatient appropriate because: severity of illness, requiring IV abxs     Consultants:  Ophthalmology   Procedures:   Antimicrobials: vanco, rocephin    Subjective: Pt c/o swelling on side of face   Objective: Vitals:   08/29/21 1504 08/29/21 2004 08/29/21 2033 08/30/21 0516  BP: (!) 105/57 (!) 109/56 124/62 (!) 106/59  Pulse: 76  73 81  Resp: 16  17 17   Temp: 98.6 F (37 C)  98.6 F (37 C) 98.5 F (36.9 C)  TempSrc: Oral  Oral Oral  SpO2: 96%  100% 96%  Weight:      Height:       No intake or output data in the 24 hours ending 08/30/21  0811 Filed Weights   08/29/21 1424  Weight: 78.1 kg    Examination:  General exam: Appears calm but comfortable  Respiratory system: Clear to auscultation. Respiratory effort normal. Cardiovascular system: S1 & S2 +. No rubs, gallops or clicks. Gastrointestinal system: Abdomen is obese, soft and nontender.  Normal bowel sounds heard. Central nervous system: Alert and oriented. Moves all extremities  Skin: swelling and erythema on lateral side of right eye  Psychiatry: Judgement and insight appear normal. Mood & affect appropriate.     Data Reviewed: I have personally reviewed following labs and imaging studies  CBC: Recent Labs  Lab 08/27/21 2056 08/29/21 1218 08/30/21 0546  WBC 6.2 8.6 6.7  NEUTROABS 4.3  --   --   HGB 11.3* 12.5 11.5*  HCT 34.5* 39.4 35.7*  MCV 92.0 91.0 90.6  PLT 111* 145* 096*   Basic Metabolic Panel: Recent Labs  Lab 08/27/21 1939 08/29/21 1218 08/30/21 0546  NA 137 139 138  K 3.8 3.7 4.1  CL 102 101 103  CO2 27 31 26   GLUCOSE 114* 76 98  BUN 21 23 27*  CREATININE 0.94 1.46* 1.18*  CALCIUM 9.5 9.5 8.8*   GFR: Estimated Creatinine Clearance: 48.3 mL/min (A) (by C-G formula based on SCr of 1.18 mg/dL (H)). Liver Function Tests: No results for input(s): AST, ALT, ALKPHOS, BILITOT, PROT, ALBUMIN in the last 168 hours. No results  for input(s): LIPASE, AMYLASE in the last 168 hours. No results for input(s): AMMONIA in the last 168 hours. Coagulation Profile: No results for input(s): INR, PROTIME in the last 168 hours. Cardiac Enzymes: No results for input(s): CKTOTAL, CKMB, CKMBINDEX, TROPONINI in the last 168 hours. BNP (last 3 results) No results for input(s): PROBNP in the last 8760 hours. HbA1C: No results for input(s): HGBA1C in the last 72 hours. CBG: No results for input(s): GLUCAP in the last 168 hours. Lipid Profile: No results for input(s): CHOL, HDL, LDLCALC, TRIG, CHOLHDL, LDLDIRECT in the last 72 hours. Thyroid Function  Tests: No results for input(s): TSH, T4TOTAL, FREET4, T3FREE, THYROIDAB in the last 72 hours. Anemia Panel: No results for input(s): VITAMINB12, FOLATE, FERRITIN, TIBC, IRON, RETICCTPCT in the last 72 hours. Sepsis Labs: No results for input(s): PROCALCITON, LATICACIDVEN in the last 168 hours.  Recent Results (from the past 240 hour(s))  Resp Panel by RT-PCR (Flu A&B, Covid) Nasopharyngeal Swab     Status: None   Collection Time: 08/29/21  6:41 PM   Specimen: Nasopharyngeal Swab; Nasopharyngeal(NP) swabs in vial transport medium  Result Value Ref Range Status   SARS Coronavirus 2 by RT PCR NEGATIVE NEGATIVE Final    Comment: (NOTE) SARS-CoV-2 target nucleic acids are NOT DETECTED.  The SARS-CoV-2 RNA is generally detectable in upper respiratory specimens during the acute phase of infection. The lowest concentration of SARS-CoV-2 viral copies this assay can detect is 138 copies/mL. A negative result does not preclude SARS-Cov-2 infection and should not be used as the sole basis for treatment or other patient management decisions. A negative result may occur with  improper specimen collection/handling, submission of specimen other than nasopharyngeal swab, presence of viral mutation(s) within the areas targeted by this assay, and inadequate number of viral copies(<138 copies/mL). A negative result must be combined with clinical observations, patient history, and epidemiological information. The expected result is Negative.  Fact Sheet for Patients:  EntrepreneurPulse.com.au  Fact Sheet for Healthcare Providers:  IncredibleEmployment.be  This test is no t yet approved or cleared by the [place] FDA and  has been authorized for detection and/or diagnosis of SARS-CoV-2 by FDA under an Emergency Use Authorization (EUA). This EUA will remain  in effect (meaning this test can be used) for the duration of the COVID-19 declaration under Section  564(b)(1) of the Act, 21 U.S.C.section 360bbb-3(b)(1), unless the authorization is terminated  or revoked sooner.       Influenza A by PCR NEGATIVE NEGATIVE Final   Influenza B by PCR NEGATIVE NEGATIVE Final    Comment: (NOTE) The Xpert Xpress SARS-CoV-2/FLU/RSV plus assay is intended as an aid in the diagnosis of influenza from Nasopharyngeal swab specimens and should not be used as a sole basis for treatment. Nasal washings and aspirates are unacceptable for Xpert Xpress SARS-CoV-2/FLU/RSV testing.  Fact Sheet for Patients: EntrepreneurPulse.com.au  Fact Sheet for Healthcare Providers: IncredibleEmployment.be  This test is not yet approved or cleared by the [place] FDA and has been authorized for detection and/or diagnosis of SARS-CoV-2 by FDA under an Emergency Use Authorization (EUA). This EUA will remain in effect (meaning this test can be used) for the duration of the COVID-19 declaration under Section 564(b)(1) of the Act, 21 U.S.C. section 360bbb-3(b)(1), unless the authorization is terminated or revoked.  Performed at [facility], [address] [place], [place] [address]   Blood culture (routine x 2)     Status: None (Preliminary result)   Collection Time: 08/29/21  7:09 PM   Specimen: BLOOD  Result Value Ref Range Status   Specimen Description BLOOD BLOOD LEFT HAND  Final   Special Requests   Final    BOTTLES DRAWN AEROBIC AND ANAEROBIC Blood Culture adequate volume   Culture   Final    NO GROWTH < 12 HOURS Performed at [facility], [address], [place], [place] [address]    Report Status PENDING  Incomplete  Blood culture (routine x 2)     Status: None (Preliminary result)   Collection Time: 08/29/21  7:18 PM   Specimen: BLOOD  Result Value Ref Range Status   Specimen Description BLOOD BLOOD RIGHT HAND  Final   Special Requests   Final    BOTTLES DRAWN AEROBIC AND ANAEROBIC Blood Culture  adequate volume   Culture   Final    NO GROWTH < 12 HOURS Performed at [facility], [address], [place], [place] [address]    Report Status PENDING  Incomplete         Radiology Studies: CT Orbits W Contrast  Result Date: 08/29/2021 CLINICAL DATA:  Orbital cellulitis suspected EXAM: CT ORBITS WITH CONTRAST TECHNIQUE: Multidetector CT images was performed according to the standard protocol following intravenous contrast administration. CONTRAST:  30mL OMNIPAQUE IOHEXOL 300 MG/ML  SOLN COMPARISON:  CT orbits 08/27/2021. FINDINGS: Orbits: Right preseptal/periorbital soft tissue thickening and edema, compatible with cellulitis. Findings are mildly progressed from 12/28. No discrete, drainable fluid collection or evidence of postseptal extension. The globes are within normal limits and symmetric. The extraocular muscles and lacrimal glands are unremarkable. No proptosis. Small nonspecific locule of gas along the superior aspect of the right orbit. Visible paranasal sinuses: Clear. Soft tissues: Right preseptal/periorbital soft tissue thickening/stranding, as described above. Otherwise, unremarkable visualized soft tissues. Osseous: No fracture or aggressive lesion. Limited intracranial: Unremarkable. IMPRESSION: Mildly progressive right preseptal/periorbital soft tissue thickening and edema, compatible with cellulitis. No discrete, drainable fluid collection or evidence of postseptal extension. Electronically Signed   By: Margaretha Sheffield M.D.   On: 08/29/2021 16:56        Scheduled Meds:  allopurinol  100 mg Oral Daily   clopidogrel  75 mg Oral Daily   levothyroxine  75 mcg Oral QAC breakfast   rosuvastatin  40 mg Oral QPM   Continuous Infusions:  cefTRIAXone (ROCEPHIN)  IV       LOS: 1 day    Time spent: 30 mins     Wyvonnia Dusky, MD [facility] Pager [phone]  If 7PM-7AM, please contact night-coverage 08/30/2021, 8:11 AM

## 2021-08-30 NOTE — Consult Note (Signed)
Reason for Consult:periorbital cellulitis R Referring Physician: Tytiana Coles is an 65 y.o. female.  Chief complaint: swelling above OD Preseptal cellulitis of right eye  HPI: 65 yo WF h/o Lupus tx with hydroxychloroquine presents with 4 day hx of swelling above R eye.  Saw ED on 12/28 and Rx Augmentin outpatient.  Was worsening on 12/30 and PCP referred her back to ED.  CT scan showed interval enlargement, but no orbital extension and no abscess formation. Pt c/o mild blurriness OD and soreness. No diplopia.  No associated sinus symptoms and no associated sinus CT findings.   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  POH - wears glasses. 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/23/2017   Post PTCA 07/12/2016   iliac stents bilaterally 2017   Thrombocytopenia ([place]) 10/04/2016   Wears dentures    full upper and lower    ROS  Past Surgical History:  Procedure Laterality Date   ELECTROMAGNETIC NAVIGATION BROCHOSCOPY N/A 12/07/2017   Procedure: ELECTROMAGNETIC NAVIGATION BRONCHOSCOPY;  Surgeon: Flora Lipps, MD;  Location: [facility];  Service: Cardiopulmonary;  Laterality: N/A;   ESOPHAGOGASTRODUODENOSCOPY (EGD) WITH PROPOFOL N/A 07/08/2016   Procedure: ESOPHAGOGASTRODUODENOSCOPY (EGD) WITH PROPOFOL;  Surgeon: Manya Silvas, MD;  Location: [facility];  Service: Endoscopy;  Laterality: N/A;   KNEE SURGERY  Right    repair of acl tear   PERIPHERAL VASCULAR CATHETERIZATION N/A 07/10/2016   Procedure: Lower Extremity Angiography;  Surgeon: Katha Cabal, MD;  Location: [place] [facility];  Service: Cardiovascular;  Laterality: N/A;   PERIPHERAL VASCULAR CATHETERIZATION N/A 07/10/2016   Procedure: Abdominal Aortogram w/Lower Extremity;  Surgeon: Katha Cabal, MD;  Location: [place] [facility];  Service: Cardiovascular;  Laterality: N/A;   PERIPHERAL VASCULAR CATHETERIZATION  07/10/2016   Procedure: Lower Extremity Intervention;  Surgeon: Katha Cabal, MD;  Location: [place] [facility];  Service: Cardiovascular;;    Family History  Problem Relation Age of Onset   Diabetes Mother    Hypertension Mother    Diabetes Maternal Grandfather    Hypertension Maternal Grandfather    Breast cancer Neg Hx     Social History:  reports that she quit smoking about 5 years ago. Her smoking use included cigarettes. She has a 58.75 pack-year smoking history. She has never used smokeless tobacco. She reports that she does not drink alcohol and does not use drugs.  Allergies:  Allergies  Allergen Reactions   Ciprofloxacin Swelling    Facial swelling following single oral dose.    Codeine Anaphylaxis   Nsaids Other (See Comments)    Patient has Hep C.     Medications: Scheduled:  allopurinol  100 mg Oral Daily   clopidogrel  75 mg Oral Daily   levothyroxine  75 mcg Oral QAC breakfast   rosuvastatin  40 mg Oral QPM    Results for orders placed or performed  during the hospital encounter of 08/29/21 (from the past 48 hour(s))  CBC     Status: Abnormal   Collection Time: 08/29/21 12:18 PM  Result Value Ref Range   WBC 8.6 4.0 - 10.5 K/uL   RBC 4.33 3.87 - 5.11 MIL/uL   Hemoglobin 12.5 12.0 - 15.0 g/dL   HCT 39.4 36.0 - 46.0 %   MCV 91.0 80.0 - 100.0 fL   MCH 28.9 26.0 - 34.0 pg   MCHC 31.7 30.0 - 36.0 g/dL   RDW 14.2 11.5 - 15.5 %   Platelets 145 (L) 150 - 400 K/uL   nRBC 0.0  0.0 - 0.2 %    Comment: Performed at [facility], [address] [place], [place] [address]  Basic metabolic panel     Status: Abnormal   Collection Time: 08/29/21 12:18 PM  Result Value Ref Range   Sodium 139 135 - 145 mmol/L   Potassium 3.7 3.5 - 5.1 mmol/L   Chloride 101 98 - 111 mmol/L   CO2 31 22 - 32 mmol/L   Glucose, Bld 76 70 - 99 mg/dL    Comment: Glucose reference range applies only to samples taken after fasting for at least 8 hours.   BUN 23 8 - 23 mg/dL   Creatinine, Ser 1.46 (H) 0.44 - 1.00 mg/dL   Calcium 9.5 8.9 - 10.3 mg/dL   GFR, Estimated 40 (L) >60 mL/min    Comment: (NOTE) Calculated using the CKD-EPI Creatinine Equation (2021)    Anion gap 7 5 - 15    Comment: Performed at [facility], [address] [place], [place] [address]  Resp Panel by RT-PCR (Flu A&B, Covid) Nasopharyngeal Swab     Status: None   Collection Time: 08/29/21  6:41 PM   Specimen: Nasopharyngeal Swab; Nasopharyngeal(NP) swabs in vial transport medium  Result Value Ref Range   SARS Coronavirus 2 by RT PCR NEGATIVE NEGATIVE    Comment: (NOTE) SARS-CoV-2 target nucleic acids are NOT DETECTED.  The SARS-CoV-2 RNA is generally detectable in upper respiratory specimens during the acute phase of infection. The lowest concentration of SARS-CoV-2 viral copies this assay can detect is 138 copies/mL. A negative result does not preclude SARS-Cov-2 infection and should not be used as the sole basis for treatment or other patient management decisions. A negative result may occur with  improper specimen collection/handling, submission of specimen other than nasopharyngeal swab, presence of viral mutation(s) within the areas targeted by this assay, and inadequate number of viral copies(<138 copies/mL). A negative result must be combined with clinical observations, patient history, and epidemiological information. The expected result is Negative.  Fact Sheet for Patients:   EntrepreneurPulse.com.au  Fact Sheet for Healthcare Providers:  IncredibleEmployment.be  This test is no t yet approved or cleared by the [place] FDA and  has been authorized for detection and/or diagnosis of SARS-CoV-2 by FDA under an Emergency Use Authorization (EUA). This EUA will remain  in effect (meaning this test can be used) for the duration of the COVID-19 declaration under Section 564(b)(1) of the Act, 21 U.S.C.section 360bbb-3(b)(1), unless the authorization is terminated  or revoked sooner.       Influenza A by PCR NEGATIVE NEGATIVE   Influenza B by PCR NEGATIVE NEGATIVE    Comment: (NOTE) The Xpert Xpress SARS-CoV-2/FLU/RSV plus assay is intended as an aid in the diagnosis of influenza from Nasopharyngeal swab specimens and should not be used as a sole basis for treatment. Nasal washings and  aspirates are unacceptable for Xpert Xpress SARS-CoV-2/FLU/RSV testing.  Fact Sheet for Patients: EntrepreneurPulse.com.au  Fact Sheet for Healthcare Providers: IncredibleEmployment.be  This test is not yet approved or cleared by the [place] FDA and has been authorized for detection and/or diagnosis of SARS-CoV-2 by FDA under an Emergency Use Authorization (EUA). This EUA will remain in effect (meaning this test can be used) for the duration of the COVID-19 declaration under Section 564(b)(1) of the Act, 21 U.S.C. section 360bbb-3(b)(1), unless the authorization is terminated or revoked.  Performed at [facility], [address] [place], [place] [address]   Blood culture (routine x 2)     Status: None (Preliminary result)   Collection Time: 08/29/21  7:09 PM   Specimen: BLOOD  Result Value Ref Range   Specimen Description BLOOD BLOOD LEFT HAND    Special Requests      BOTTLES DRAWN AEROBIC AND ANAEROBIC Blood Culture adequate volume   Culture      NO GROWTH < 12  HOURS Performed at [facility], [address], [place], [place] [address]    Report Status PENDING   HIV Antibody (routine testing w rflx)     Status: None   Collection Time: 08/29/21  7:17 PM  Result Value Ref Range   HIV Screen 4th Generation wRfx Non Reactive Non Reactive    Comment: Performed at [facility], [place] [address] [place], [place] [address]  Blood culture (routine x 2)     Status: None (Preliminary result)   Collection Time: 08/29/21  7:18 PM   Specimen: BLOOD  Result Value Ref Range   Specimen Description BLOOD BLOOD RIGHT HAND    Special Requests      BOTTLES DRAWN AEROBIC AND ANAEROBIC Blood Culture adequate volume   Culture      NO GROWTH < 12 HOURS Performed at [facility], [address], [place], [place] [address]    Report Status PENDING   Basic metabolic panel     Status: Abnormal   Collection Time: 08/30/21  5:46 AM  Result Value Ref Range   Sodium 138 135 - 145 mmol/L   Potassium 4.1 3.5 - 5.1 mmol/L   Chloride 103 98 - 111 mmol/L   CO2 26 22 - 32 mmol/L   Glucose, Bld 98 70 - 99 mg/dL    Comment: Glucose reference range applies only to samples taken after fasting for at least 8 hours.   BUN 27 (H) 8 - 23 mg/dL   Creatinine, Ser 1.18 (H) 0.44 - 1.00 mg/dL   Calcium 8.8 (L) 8.9 - 10.3 mg/dL   GFR, Estimated 52 (L) >60 mL/min    Comment: (NOTE) Calculated using the CKD-EPI Creatinine Equation (2021)    Anion gap 9 5 - 15    Comment: Performed at [facility], [address] [place], [place] [address]  CBC     Status: Abnormal   Collection Time: 08/30/21  5:46 AM  Result Value Ref Range   WBC 6.7 4.0 - 10.5 K/uL   RBC 3.94 3.87 - 5.11 MIL/uL   Hemoglobin 11.5 (L) 12.0 - 15.0 g/dL   HCT 35.7 (L) 36.0 - 46.0 %   MCV 90.6 80.0 - 100.0 fL   MCH 29.2 26.0 - 34.0 pg   MCHC 32.2 30.0 - 36.0 g/dL   RDW 14.2 11.5 - 15.5 %   Platelets 127 (L) 150 - 400 K/uL   nRBC 0.0 0.0 - 0.2 %  Comment: Performed at [facility], [address] [place], [place] [address]    CT Orbits W Contrast  Result Date: 08/29/2021 CLINICAL DATA:  Orbital cellulitis suspected EXAM: CT ORBITS WITH CONTRAST TECHNIQUE: Multidetector CT images was performed according to the standard protocol following intravenous contrast administration. CONTRAST:  59mL OMNIPAQUE IOHEXOL 300 MG/ML  SOLN COMPARISON:  CT orbits 08/27/2021. FINDINGS: Orbits: Right preseptal/periorbital soft tissue thickening and edema, compatible with cellulitis. Findings are mildly progressed from 12/28. No discrete, drainable fluid collection or evidence of postseptal extension. The globes are within normal limits and symmetric. The extraocular muscles and lacrimal glands are unremarkable. No proptosis. Small nonspecific locule of gas along the superior aspect of the right orbit. Visible paranasal sinuses: Clear. Soft tissues: Right preseptal/periorbital soft tissue thickening/stranding, as described above. Otherwise, unremarkable visualized soft tissues. Osseous: No fracture or aggressive lesion. Limited intracranial: Unremarkable. IMPRESSION: Mildly progressive right preseptal/periorbital soft tissue thickening and edema, compatible with cellulitis. No discrete, drainable fluid collection or evidence of postseptal extension. Electronically Signed   By: Margaretha Sheffield M.D.   On: 08/29/2021 16:56    Blood pressure 138/67, pulse 77, temperature 98 F (36.7 C), temperature source Oral, resp. rate 16, height 5\' 3"  (1.6 m), weight 78.1 kg, SpO2 98 %.  Mental status: Alert and Oriented x 4  Visual Acuity:  20/200 OD  20/80 near cc  Pupils:  Equally round/ reactive to light.  No Afferent defect.  Motility:  Full/ orthophoric  Visual Fields:  Full to confrontation  IOP:  32 OD 23 OS tonopen with lids held and patient squeezing  External/ Lids/ Lashes:  tense swellling of temporal RUL and brow.  2+ erythema.  Approx 0.8 cm cyst Right LAT canthus. Mucus on  lashes RUL.  Anterior Segment:  Conjunctiva:  Normal  OU  Cornea:  Normal  OU  Anterior Chamber: Normal  OU  Lens:   NS OU  Posterior Segment: deferred.  Good red reflex    Assessment/Plan: Periorbital cellulitis OD.  CT shows no orbital extension.  Exam consistent with periorbital and not orbital cellulitis.  No abscess to drain on CT or exam.  Pt failed outpatient treatment, perhaps due to immunosuppresion and chronic disease (Lupus, Renal insuff.)   Continue IV abx (Rocephin, Vanc) and transition to oral.  Nothing available to culture to guide abx selection.   Benign- appearing cyst right Lateral canthus - observe.  Pt asked about excision as outpatient.   Follow up 2 weeks or prn at [facility].   Daishia Fetterly 08/30/2021, 12:40 PM

## 2021-08-30 NOTE — Consult Note (Signed)
Pharmacy Antibiotic Note  Tina Page is a 65 y.o. female admitted on 08/29/2021 with Preseptal cellulitis of right eye a/w conjunctiva discharge after worsening on outpatient Augmentin treatment (08/28/2021-09/08/2021).  Pharmacy has been consulted for vancomycin dosing. Her renal function is slightly lower than baseline (based on SCr) but improved from yesterday.  Plan: start vancomycin 750 mg IV Q 24 hrs Goal AUC 400-550 Expected AUC: 507.3 SCr used: 1.18 mg/dL Ke: 0.038 h-1, T1/2: 18.3 h follow renal function for dosing adjustments   Height: 5\' 3"  (160 cm) Weight: 78.1 kg (172 lb 2.9 oz) IBW/kg (Calculated) : 52.4  Temp (24hrs), Avg:98.3 F (36.8 C), Min:98 F (36.7 C), Max:98.6 F (37 C)  Recent Labs  Lab 08/27/21 1939 08/27/21 2056 08/29/21 1218 08/30/21 0546  WBC  --  6.2 8.6 6.7  CREATININE 0.94  --  1.46* 1.18*     Estimated Creatinine Clearance: 48.3 mL/min (A) (by C-G formula based on SCr of 1.18 mg/dL (H)).    Allergies  Allergen Reactions   Ciprofloxacin Swelling    Facial swelling following single oral dose.    Codeine Anaphylaxis   Nsaids Other (See Comments)    Patient has Hep C.     Antimicrobials this admission: vancomycin & ceftriaxone ( 12/30 >>   Microbiology results: 12/30 BCx: NG<12 hours 12/30 SARS CoV-2: negative 12/30 influenza A/B: negative 12/30 MRSA PCR: ordered  Thank you for allowing pharmacy to be a part of this patients care.  Dallie Piles 08/30/2021 9:26 AM

## 2021-08-31 DIAGNOSIS — M329 Systemic lupus erythematosus, unspecified: Secondary | ICD-10-CM | POA: Diagnosis not present

## 2021-08-31 DIAGNOSIS — N1831 Chronic kidney disease, stage 3a: Secondary | ICD-10-CM | POA: Diagnosis not present

## 2021-08-31 DIAGNOSIS — L03213 Periorbital cellulitis: Secondary | ICD-10-CM | POA: Diagnosis not present

## 2021-08-31 LAB — CBC
HCT: 36.3 % (ref 36.0–46.0)
Hemoglobin: 11.8 g/dL — ABNORMAL LOW (ref 12.0–15.0)
MCH: 29 pg (ref 26.0–34.0)
MCHC: 32.5 g/dL (ref 30.0–36.0)
MCV: 89.2 fL (ref 80.0–100.0)
Platelets: 116 10*3/uL — ABNORMAL LOW (ref 150–400)
RBC: 4.07 MIL/uL (ref 3.87–5.11)
RDW: 14.1 % (ref 11.5–15.5)
WBC: 4.9 10*3/uL (ref 4.0–10.5)
nRBC: 0 % (ref 0.0–0.2)

## 2021-08-31 LAB — BASIC METABOLIC PANEL
Anion gap: 11 (ref 5–15)
BUN: 23 mg/dL (ref 8–23)
CO2: 25 mmol/L (ref 22–32)
Calcium: 9.2 mg/dL (ref 8.9–10.3)
Chloride: 102 mmol/L (ref 98–111)
Creatinine, Ser: 1.05 mg/dL — ABNORMAL HIGH (ref 0.44–1.00)
GFR, Estimated: 60 mL/min — ABNORMAL LOW (ref >60)
Glucose, Bld: 90 mg/dL (ref 70–99)
Potassium: 4 mmol/L (ref 3.5–5.1)
Sodium: 138 mmol/L (ref 135–145)

## 2021-08-31 MED ORDER — METHYLPREDNISOLONE SODIUM SUCC 40 MG IJ SOLR
40.0000 mg | Freq: Two times a day (BID) | INTRAMUSCULAR | Status: DC
  Administered 2021-08-31 – 2021-09-02 (×4): 40 mg via INTRAVENOUS
  Filled 2021-08-31 (×4): qty 1

## 2021-08-31 MED ORDER — ENOXAPARIN SODIUM 40 MG/0.4ML IJ SOSY
40.0000 mg | PREFILLED_SYRINGE | INTRAMUSCULAR | Status: DC
  Administered 2021-08-31 – 2021-09-01 (×2): 40 mg via SUBCUTANEOUS
  Filled 2021-08-31 (×2): qty 0.4

## 2021-08-31 NOTE — Progress Notes (Signed)
PROGRESS NOTE    Tina Page  KZL:[id number] DOB: 1955/11/12 DOA: 08/29/2021 PCP: Frazier Richards, MD   Assessment & Plan:   Principal Problem:   Preseptal cellulitis of right eye Active Problems:   Essential hypertension   COPD (chronic obstructive pulmonary disease) ([place])   PAD (peripheral artery disease) (HCC)   Hypothyroidism   Stage 3a chronic kidney disease (CKD) ([place])   Lupus ([place])   Periorbital cellulitis: slight decrease in edema. Continue on IV rocephin, vanco. Started on IV steroids to help w/ inflammation/edema. Ophthalmology recs apprec. F/u 2 weeks or prn w/ [place] [facility]   Cyst: of right lateral canthus as per ophthalmology. Observe as per ophtha   Lupus: continue to hold home dose of plaquenil    CKDIIIa: Cr is trending down slightly from day prior    PAD: continue on plavix, statin    HTN: continue to hold lisinopril, HCTZ as BP is low normal    COPD: w/o exacerbation. Continue on bronchodilators    Hypothyroidism: continue on levothyroxine   Thrombocytopenia: etiology unclear. Will continue to monitor   Obesity: BMI 30.5. Would benefit from weight loss   DVT prophylaxis: lovenox  Code Status: full  Family Communication:  Disposition Plan: likely d/c back home   Level of care: Med-Surg  Status is: Inpatient  Remains inpatient appropriate because: severity of illness, requiring IV abxs & IV steroids    Consultants:  Ophthalmology   Procedures:   Antimicrobials: vanco, rocephin    Subjective: Pt still c/o swelling on side of eye but improved vision of the right eye   Objective: Vitals:   08/30/21 0814 08/30/21 1647 08/30/21 1939 08/31/21 0537  BP: 138/67 (!) 125/59 126/60 110/67  Pulse: 77 85 84 83  Resp: 16 16 17 17   Temp: 98 F (36.7 C) 98.4 F (36.9 C) 98.3 F (36.8 C) 98.3 F (36.8 C)  TempSrc: Oral Oral Oral Oral  SpO2: 98% 99% 100% 97%  Weight:      Height:        Intake/Output Summary (Last 24 hours) at  08/31/2021 0753 Last data filed at 08/30/2021 1858 Gross per 24 hour  Intake 495.93 ml  Output --  Net 495.93 ml   Filed Weights   08/29/21 1424  Weight: 78.1 kg    Examination:  General exam: Appears comfortable  Respiratory system: clear breath sounds b/l  Cardiovascular system: S1/S2+. No gallops or clicks. Gastrointestinal system: Abd is soft, NT, obese & normal bowel sounds Central nervous system: Alert and oriented. Moves all extremities  Skin: swelling and erythema on lateral side of right eye  Psychiatry: Judgement and insight appears normal. Flat mood and affect     Data Reviewed: I have personally reviewed following labs and imaging studies  CBC: Recent Labs  Lab 08/27/21 2056 08/29/21 1218 08/30/21 0546 08/31/21 0552  WBC 6.2 8.6 6.7 4.9  NEUTROABS 4.3  --   --   --   HGB 11.3* 12.5 11.5* 11.8*  HCT 34.5* 39.4 35.7* 36.3  MCV 92.0 91.0 90.6 89.2  PLT 111* 145* 127* 390*   Basic Metabolic Panel: Recent Labs  Lab 08/27/21 1939 08/29/21 1218 08/30/21 0546 08/31/21 0552  NA 137 139 138 138  K 3.8 3.7 4.1 4.0  CL 102 101 103 102  CO2 27 31 26 25   GLUCOSE 114* 76 98 90  BUN 21 23 27* 23  CREATININE 0.94 1.46* 1.18* 1.05*  CALCIUM 9.5 9.5 8.8* 9.2   GFR: Estimated Creatinine  Clearance: 54.3 mL/min (A) (by C-G formula based on SCr of 1.05 mg/dL (H)). Liver Function Tests: No results for input(s): AST, ALT, ALKPHOS, BILITOT, PROT, ALBUMIN in the last 168 hours. No results for input(s): LIPASE, AMYLASE in the last 168 hours. No results for input(s): AMMONIA in the last 168 hours. Coagulation Profile: No results for input(s): INR, PROTIME in the last 168 hours. Cardiac Enzymes: No results for input(s): CKTOTAL, CKMB, CKMBINDEX, TROPONINI in the last 168 hours. BNP (last 3 results) No results for input(s): PROBNP in the last 8760 hours. HbA1C: No results for input(s): HGBA1C in the last 72 hours. CBG: No results for input(s): GLUCAP in the last 168  hours. Lipid Profile: No results for input(s): CHOL, HDL, LDLCALC, TRIG, CHOLHDL, LDLDIRECT in the last 72 hours. Thyroid Function Tests: No results for input(s): TSH, T4TOTAL, FREET4, T3FREE, THYROIDAB in the last 72 hours. Anemia Panel: No results for input(s): VITAMINB12, FOLATE, FERRITIN, TIBC, IRON, RETICCTPCT in the last 72 hours. Sepsis Labs: No results for input(s): PROCALCITON, LATICACIDVEN in the last 168 hours.  Recent Results (from the past 240 hour(s))  Resp Panel by RT-PCR (Flu A&B, Covid) Nasopharyngeal Swab     Status: None   Collection Time: 08/29/21  6:41 PM   Specimen: Nasopharyngeal Swab; Nasopharyngeal(NP) swabs in vial transport medium  Result Value Ref Range Status   SARS Coronavirus 2 by RT PCR NEGATIVE NEGATIVE Final    Comment: (NOTE) SARS-CoV-2 target nucleic acids are NOT DETECTED.  The SARS-CoV-2 RNA is generally detectable in upper respiratory specimens during the acute phase of infection. The lowest concentration of SARS-CoV-2 viral copies this assay can detect is 138 copies/mL. A negative result does not preclude SARS-Cov-2 infection and should not be used as the sole basis for treatment or other patient management decisions. A negative result may occur with  improper specimen collection/handling, submission of specimen other than nasopharyngeal swab, presence of viral mutation(s) within the areas targeted by this assay, and inadequate number of viral copies(<138 copies/mL). A negative result must be combined with clinical observations, patient history, and epidemiological information. The expected result is Negative.  Fact Sheet for Patients:  EntrepreneurPulse.com.au  Fact Sheet for Healthcare Providers:  IncredibleEmployment.be  This test is no t yet approved or cleared by the [place] FDA and  has been authorized for detection and/or diagnosis of SARS-CoV-2 by FDA under an Emergency Use Authorization  (EUA). This EUA will remain  in effect (meaning this test can be used) for the duration of the COVID-19 declaration under Section 564(b)(1) of the Act, 21 U.S.C.section 360bbb-3(b)(1), unless the authorization is terminated  or revoked sooner.       Influenza A by PCR NEGATIVE NEGATIVE Final   Influenza B by PCR NEGATIVE NEGATIVE Final    Comment: (NOTE) The Xpert Xpress SARS-CoV-2/FLU/RSV plus assay is intended as an aid in the diagnosis of influenza from Nasopharyngeal swab specimens and should not be used as a sole basis for treatment. Nasal washings and aspirates are unacceptable for Xpert Xpress SARS-CoV-2/FLU/RSV testing.  Fact Sheet for Patients: EntrepreneurPulse.com.au  Fact Sheet for Healthcare Providers: IncredibleEmployment.be  This test is not yet approved or cleared by the [place] FDA and has been authorized for detection and/or diagnosis of SARS-CoV-2 by FDA under an Emergency Use Authorization (EUA). This EUA will remain in effect (meaning this test can be used) for the duration of the COVID-19 declaration under Section 564(b)(1) of the Act, 21 U.S.C. section 360bbb-3(b)(1), unless the authorization is terminated or  revoked.  Performed at [facility], [address] [place], [place] [address]   Blood culture (routine x 2)     Status: None (Preliminary result)   Collection Time: 08/29/21  7:09 PM   Specimen: BLOOD  Result Value Ref Range Status   Specimen Description BLOOD BLOOD LEFT HAND  Final   Special Requests   Final    BOTTLES DRAWN AEROBIC AND ANAEROBIC Blood Culture adequate volume   Culture   Final    NO GROWTH 2 DAYS Performed at [facility], [address] [place], [place] [address]    Report Status PENDING  Incomplete  Blood culture (routine x 2)     Status: None (Preliminary result)   Collection Time: 08/29/21  7:18 PM   Specimen: BLOOD  Result Value Ref Range Status    Specimen Description BLOOD BLOOD RIGHT HAND  Final   Special Requests   Final    BOTTLES DRAWN AEROBIC AND ANAEROBIC Blood Culture adequate volume   Culture   Final    NO GROWTH 2 DAYS Performed at [facility], [address] [place],  [address]    Report Status PENDING  Incomplete         Radiology Studies: CT Orbits W Contrast  Result Date: 08/29/2021 CLINICAL DATA:  Orbital cellulitis suspected EXAM: CT ORBITS WITH CONTRAST TECHNIQUE: Multidetector CT images was performed according to the standard protocol following intravenous contrast administration. CONTRAST:  54mL OMNIPAQUE IOHEXOL 300 MG/ML  SOLN COMPARISON:  CT orbits 08/27/2021. FINDINGS: Orbits: Right preseptal/periorbital soft tissue thickening and edema, compatible with cellulitis. Findings are mildly progressed from 12/28. No discrete, drainable fluid collection or evidence of postseptal extension. The globes are within normal limits and symmetric. The extraocular muscles and lacrimal glands are unremarkable. No proptosis. Small nonspecific locule of gas along the superior aspect of the right orbit. Visible paranasal sinuses: Clear. Soft tissues: Right preseptal/periorbital soft tissue thickening/stranding, as described above. Otherwise, unremarkable visualized soft tissues. Osseous: No fracture or aggressive lesion. Limited intracranial: Unremarkable. IMPRESSION: Mildly progressive right preseptal/periorbital soft tissue thickening and edema, compatible with cellulitis. No discrete, drainable fluid collection or evidence of postseptal extension. Electronically Signed   By: Margaretha Sheffield M.D.   On: 08/29/2021 16:56        Scheduled Meds:  allopurinol  100 mg Oral Daily   clopidogrel  75 mg Oral Daily   levothyroxine  75 mcg Oral QAC breakfast   rosuvastatin  40 mg Oral QPM   Continuous Infusions:  cefTRIAXone (ROCEPHIN)  IV Stopped (08/30/21 1742)   vancomycin 750 mg (08/31/21 0100)     LOS: 2  days    Time spent: 25 mins     Wyvonnia Dusky, MD [facility] Pager [phone]  If 7PM-7AM, please contact night-coverage 08/31/2021, 7:53 AM

## 2021-09-01 DIAGNOSIS — L03213 Periorbital cellulitis: Secondary | ICD-10-CM | POA: Diagnosis not present

## 2021-09-01 DIAGNOSIS — M329 Systemic lupus erythematosus, unspecified: Secondary | ICD-10-CM | POA: Diagnosis not present

## 2021-09-01 DIAGNOSIS — I739 Peripheral vascular disease, unspecified: Secondary | ICD-10-CM | POA: Diagnosis not present

## 2021-09-01 LAB — BASIC METABOLIC PANEL
Anion gap: 10 (ref 5–15)
BUN: 21 mg/dL (ref 8–23)
CO2: 24 mmol/L (ref 22–32)
Calcium: 9.3 mg/dL (ref 8.9–10.3)
Chloride: 104 mmol/L (ref 98–111)
Creatinine, Ser: 0.98 mg/dL (ref 0.44–1.00)
GFR, Estimated: >60 mL/min (ref >60)
Glucose, Bld: 156 mg/dL — ABNORMAL HIGH (ref 70–99)
Potassium: 4.3 mmol/L (ref 3.5–5.1)
Sodium: 138 mmol/L (ref 135–145)

## 2021-09-01 LAB — CBC
HCT: 39.4 % (ref 36.0–46.0)
Hemoglobin: 12.9 g/dL (ref 12.0–15.0)
MCH: 29.3 pg (ref 26.0–34.0)
MCHC: 32.7 g/dL (ref 30.0–36.0)
MCV: 89.5 fL (ref 80.0–100.0)
Platelets: 132 10*3/uL — ABNORMAL LOW (ref 150–400)
RBC: 4.4 MIL/uL (ref 3.87–5.11)
RDW: 13.5 % (ref 11.5–15.5)
WBC: 6.4 10*3/uL (ref 4.0–10.5)
nRBC: 0 % (ref 0.0–0.2)

## 2021-09-01 NOTE — Progress Notes (Signed)
PROGRESS NOTE    Tina Page  IHK:[id number] DOB: 1956/02/25 DOA: 08/29/2021 PCP: Frazier Richards, MD   Assessment & Plan:   Principal Problem:   Preseptal cellulitis of right eye Active Problems:   Essential hypertension   COPD (chronic obstructive pulmonary disease) ([place])   PAD (peripheral artery disease) ([place])   Hypothyroidism   Stage 3a chronic kidney disease (CKD) ([place])   Lupus ([place])   Periorbital cellulitis: improving in edema slowly. Continue on IV rocephin, vanco, IV steroids. Ophthalmology recs apprec and will f/u in 2 weeks or prn w/ [facility]   Cyst: of right lateral canthus as per ophthalmology. Observe as per ophtha   Lupus: continue to hold home dose of plaquenil    CKDIIIa:  Cr is better than baseline    PAD: continue on statin, plavix    HTN: continue to hold lisinopril, HCTZ as BP is low normal    COPD: w/o exacerbation. Continue on bronchodilators    Hypothyroidism: continue on levothyroxine   Thrombocytopenia: etiology unclear. Will continue to monitor   Obesity: BMI 30.5. Would benefit from weight loss   DVT prophylaxis: lovenox  Code Status: full  Family Communication:  Disposition Plan: likely d/c back home   Level of care: Med-Surg  Status is: Inpatient  Remains inpatient appropriate because: severity of illness, requiring IV abxs & IV steroids. Will likely d/c home tomorrow if edema continues to improve    Consultants:  Ophthalmology   Procedures:   Antimicrobials: vanco, rocephin    Subjective: Pt c/o fatigue   Objective: Vitals:   08/31/21 0537 08/31/21 1648 08/31/21 1941 09/01/21 0439  BP: 110/67 (!) 149/53 135/65 130/70  Pulse: 83 78 79 75  Resp: 17 16 18 18   Temp: 98.3 F (36.8 C) 98.5 F (36.9 C) 98.3 F (36.8 C) 98.3 F (36.8 C)  TempSrc: Oral Oral    SpO2: 97% 99% 99% 99%  Weight:      Height:       No intake or output data in the 24 hours ending 09/01/21 0742  Filed Weights   08/29/21 1424   Weight: 78.1 kg    Examination:  General exam: Appears calm & comfortable  Respiratory system: clear breath sounds b/l. No wheezes, rales  Cardiovascular system: S1 & S2+. No rubs or gallops  Gastrointestinal system: abd is soft, NT, obese & normal bowel sounds  Central nervous system: alert and oriented.  Skin: erythema and swelling lateral to right eye  Psychiatry: Judgement and insight appears normal. Appropriate mood and affect     Data Reviewed: I have personally reviewed following labs and imaging studies  CBC: Recent Labs  Lab 08/27/21 2056 08/29/21 1218 08/30/21 0546 08/31/21 0552  WBC 6.2 8.6 6.7 4.9  NEUTROABS 4.3  --   --   --   HGB 11.3* 12.5 11.5* 11.8*  HCT 34.5* 39.4 35.7* 36.3  MCV 92.0 91.0 90.6 89.2  PLT 111* 145* 127* 756*   Basic Metabolic Panel: Recent Labs  Lab 08/27/21 1939 08/29/21 1218 08/30/21 0546 08/31/21 0552  NA 137 139 138 138  K 3.8 3.7 4.1 4.0  CL 102 101 103 102  CO2 27 31 26 25   GLUCOSE 114* 76 98 90  BUN 21 23 27* 23  CREATININE 0.94 1.46* 1.18* 1.05*  CALCIUM 9.5 9.5 8.8* 9.2   GFR: Estimated Creatinine Clearance: 54.3 mL/min (A) (by C-G formula based on SCr of 1.05 mg/dL (H)). Liver Function Tests: No results for input(s): AST,  ALT, ALKPHOS, BILITOT, PROT, ALBUMIN in the last 168 hours. No results for input(s): LIPASE, AMYLASE in the last 168 hours. No results for input(s): AMMONIA in the last 168 hours. Coagulation Profile: No results for input(s): INR, PROTIME in the last 168 hours. Cardiac Enzymes: No results for input(s): CKTOTAL, CKMB, CKMBINDEX, TROPONINI in the last 168 hours. BNP (last 3 results) No results for input(s): PROBNP in the last 8760 hours. HbA1C: No results for input(s): HGBA1C in the last 72 hours. CBG: No results for input(s): GLUCAP in the last 168 hours. Lipid Profile: No results for input(s): CHOL, HDL, LDLCALC, TRIG, CHOLHDL, LDLDIRECT in the last 72 hours. Thyroid Function Tests: No  results for input(s): TSH, T4TOTAL, FREET4, T3FREE, THYROIDAB in the last 72 hours. Anemia Panel: No results for input(s): VITAMINB12, FOLATE, FERRITIN, TIBC, IRON, RETICCTPCT in the last 72 hours. Sepsis Labs: No results for input(s): PROCALCITON, LATICACIDVEN in the last 168 hours.  Recent Results (from the past 240 hour(s))  Resp Panel by RT-PCR (Flu A&B, Covid) Nasopharyngeal Swab     Status: None   Collection Time: 08/29/21  6:41 PM   Specimen: Nasopharyngeal Swab; Nasopharyngeal(NP) swabs in vial transport medium  Result Value Ref Range Status   SARS Coronavirus 2 by RT PCR NEGATIVE NEGATIVE Final    Comment: (NOTE) SARS-CoV-2 target nucleic acids are NOT DETECTED.  The SARS-CoV-2 RNA is generally detectable in upper respiratory specimens during the acute phase of infection. The lowest concentration of SARS-CoV-2 viral copies this assay can detect is 138 copies/mL. A negative result does not preclude SARS-Cov-2 infection and should not be used as the sole basis for treatment or other patient management decisions. A negative result may occur with  improper specimen collection/handling, submission of specimen other than nasopharyngeal swab, presence of viral mutation(s) within the areas targeted by this assay, and inadequate number of viral copies(<138 copies/mL). A negative result must be combined with clinical observations, patient history, and epidemiological information. The expected result is Negative.  Fact Sheet for Patients:  EntrepreneurPulse.com.au  Fact Sheet for Healthcare Providers:  IncredibleEmployment.be  This test is no t yet approved or cleared by the [place] FDA and  has been authorized for detection and/or diagnosis of SARS-CoV-2 by FDA under an Emergency Use Authorization (EUA). This EUA will remain  in effect (meaning this test can be used) for the duration of the COVID-19 declaration under Section 564(b)(1) of  the Act, 21 U.S.C.section 360bbb-3(b)(1), unless the authorization is terminated  or revoked sooner.       Influenza A by PCR NEGATIVE NEGATIVE Final   Influenza B by PCR NEGATIVE NEGATIVE Final    Comment: (NOTE) The Xpert Xpress SARS-CoV-2/FLU/RSV plus assay is intended as an aid in the diagnosis of influenza from Nasopharyngeal swab specimens and should not be used as a sole basis for treatment. Nasal washings and aspirates are unacceptable for Xpert Xpress SARS-CoV-2/FLU/RSV testing.  Fact Sheet for Patients: EntrepreneurPulse.com.au  Fact Sheet for Healthcare Providers: IncredibleEmployment.be  This test is not yet approved or cleared by the [place] FDA and has been authorized for detection and/or diagnosis of SARS-CoV-2 by FDA under an Emergency Use Authorization (EUA). This EUA will remain in effect (meaning this test can be used) for the duration of the COVID-19 declaration under Section 564(b)(1) of the Act, 21 U.S.C. section 360bbb-3(b)(1), unless the authorization is terminated or revoked.  Performed at [facility], [address], [place], [place] [address]   Blood culture (routine x 2)  Status: None (Preliminary result)   Collection Time: 08/29/21  7:09 PM   Specimen: BLOOD  Result Value Ref Range Status   Specimen Description BLOOD BLOOD LEFT HAND  Final   Special Requests   Final    BOTTLES DRAWN AEROBIC AND ANAEROBIC Blood Culture adequate volume   Culture   Final    NO GROWTH 2 DAYS Performed at [facility], [address] [place], [place] [address]    Report Status PENDING  Incomplete  Blood culture (routine x 2)     Status: None (Preliminary result)   Collection Time: 08/29/21  7:18 PM   Specimen: BLOOD  Result Value Ref Range Status   Specimen Description BLOOD BLOOD RIGHT HAND  Final   Special Requests   Final    BOTTLES DRAWN AEROBIC AND ANAEROBIC Blood Culture adequate volume    Culture   Final    NO GROWTH 2 DAYS Performed at [facility], [address], [place],  [address]    Report Status PENDING  Incomplete         Radiology Studies: No results found.      Scheduled Meds:  allopurinol  100 mg Oral Daily   clopidogrel  75 mg Oral Daily   enoxaparin (LOVENOX) injection  40 mg Subcutaneous Q24H   levothyroxine  75 mcg Oral QAC breakfast   methylPREDNISolone (SOLU-MEDROL) injection  40 mg Intravenous Q12H   rosuvastatin  40 mg Oral QPM   Continuous Infusions:  cefTRIAXone (ROCEPHIN)  IV Stopped (08/31/21 1842)   vancomycin 750 mg (08/31/21 2300)     LOS: 3 days    Time spent: 15 mins     Wyvonnia Dusky, MD [facility] Pager [phone]  If 7PM-7AM, please contact night-coverage 09/01/2021, 7:42 AM

## 2021-09-02 DIAGNOSIS — N1831 Chronic kidney disease, stage 3a: Secondary | ICD-10-CM | POA: Diagnosis not present

## 2021-09-02 DIAGNOSIS — L03213 Periorbital cellulitis: Secondary | ICD-10-CM | POA: Diagnosis not present

## 2021-09-02 DIAGNOSIS — M329 Systemic lupus erythematosus, unspecified: Secondary | ICD-10-CM | POA: Diagnosis not present

## 2021-09-02 LAB — CBC
HCT: 36.7 % (ref 36.0–46.0)
Hemoglobin: 12 g/dL (ref 12.0–15.0)
MCH: 29.1 pg (ref 26.0–34.0)
MCHC: 32.7 g/dL (ref 30.0–36.0)
MCV: 89.1 fL (ref 80.0–100.0)
Platelets: 132 10*3/uL — ABNORMAL LOW (ref 150–400)
RBC: 4.12 MIL/uL (ref 3.87–5.11)
RDW: 13.7 % (ref 11.5–15.5)
WBC: 11.8 10*3/uL — ABNORMAL HIGH (ref 4.0–10.5)
nRBC: 0 % (ref 0.0–0.2)

## 2021-09-02 LAB — BASIC METABOLIC PANEL
Anion gap: 7 (ref 5–15)
BUN: 28 mg/dL — ABNORMAL HIGH (ref 8–23)
CO2: 23 mmol/L (ref 22–32)
Calcium: 9.2 mg/dL (ref 8.9–10.3)
Chloride: 110 mmol/L (ref 98–111)
Creatinine, Ser: 0.88 mg/dL (ref 0.44–1.00)
GFR, Estimated: >60 mL/min (ref >60)
Glucose, Bld: 175 mg/dL — ABNORMAL HIGH (ref 70–99)
Potassium: 4.4 mmol/L (ref 3.5–5.1)
Sodium: 140 mmol/L (ref 135–145)

## 2021-09-02 MED ORDER — CEFDINIR 300 MG PO CAPS: 300.0000 mg | ORAL_CAPSULE | Freq: Two times a day (BID) | ORAL | 0 refills | Status: AC

## 2021-09-02 MED ORDER — PREDNISONE 10 MG PO TABS: ORAL_TABLET | ORAL | 0 refills | Status: DC

## 2021-09-02 MED ORDER — DOXYCYCLINE MONOHYDRATE 100 MG PO CAPS: 100.0000 mg | ORAL_CAPSULE | Freq: Two times a day (BID) | ORAL | 0 refills | Status: AC

## 2021-09-02 NOTE — TOC Initial Note (Signed)
Transition of Care [facility]) - Initial/Assessment Note    Patient Details  Name: Tina Page MRN: [id number] Date of Birth: 1956-05-11  Transition of Care [facility]) CM/SW Contact:    Beverly Sessions, RN Phone Number: 09/02/2021, 1:15 PM  Clinical Narrative:                   Transition of Care [facility]) Screening Note   Patient Details  Name: Tina Page Date of Birth: Feb 25, 1956   Transition of Care [facility]) CM/SW Contact:    Beverly Sessions, RN Phone Number: 09/02/2021, 1:15 PM    Transition of Care Department [facility]) has reviewed patient and no TOC needs have been identified at this time. We will continue to monitor patient advancement through interdisciplinary progression rounds. If new patient transition needs arise, please place a TOC consult.         Patient Goals and CMS Choice        Expected Discharge Plan and Services           Expected Discharge Date: 09/02/21                                    Prior Living Arrangements/Services                       Activities of Daily Living Home Assistive Devices/Equipment: None ADL Screening (condition at time of admission) Patient's cognitive ability adequate to safely complete daily activities?: Yes Is the patient deaf or have difficulty hearing?: No Does the patient have difficulty seeing, even when wearing glasses/contacts?: No Does the patient have difficulty concentrating, remembering, or making decisions?: No Patient able to express need for assistance with ADLs?: Yes Does the patient have difficulty dressing or bathing?: No Independently performs ADLs?: Yes (appropriate for developmental age) Does the patient have difficulty walking or climbing stairs?: No Weakness of Legs: None Weakness of Arms/Hands: None  Permission Sought/Granted                  Emotional Assessment              Admission diagnosis:  Preseptal cellulitis [L03.213] Preseptal cellulitis of right eye  [L03.213] Patient Active Problem List   Diagnosis Date Noted   Preseptal cellulitis of right eye 08/29/2021   Leukopenia 11/22/2020   Herpes zoster without complication    Left leg pain 11/17/2020   Diarrhea 09/01/2020   Clostridium difficile diarrhea 08/26/2020   Lupus ([place]) 08/11/2020   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[place]) 08/11/2020   Chronic viral hepatitis B without delta agent and without coma ([place]) 08/11/2020   Papular rash 08/05/2020   Bacteremia due to Escherichia coli 07/17/2020   UTI (urinary tract infection) 07/16/2020   Hypothyroidism    Fever    Encounter for antineoplastic immunotherapy 07/15/2020   Goals of care, counseling/discussion 06/19/2020   Abnormal CT of the chest 06/04/2020   Oral candidiasis 06/04/2020   Former smoker 06/04/2020   Pulmonary nodules 12/28/2019   Stage 3a chronic kidney disease (CKD) ([place]) 05/15/2019   Postop check 04/24/2019   Paronychia of great toe of right foot 04/17/2019   Ingrowing nail 04/17/2019   Medication monitoring encounter 03/25/2018   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   Elevated liver function tests 11/03/2017   Elevated uric acid in blood 11/03/2017   Low serum cortisol level 01/04/2017   Elevated alkaline phosphatase level 10/25/2016   Oral herpes simplex infection 10/25/2016   Current chronic use of systemic steroids 10/23/2016   Impetigo 10/23/2016   Thrombocytopenia ([place]) 10/04/2016   B12 deficiency 08/06/2016   Symptomatic anemia 07/21/2016   Atherosclerosis of native arteries of extremity with intermittent claudication ([place]) 07/20/2016   PAD (peripheral artery disease) ([place])  07/12/2016   Post PTCA 07/12/2016   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 ([address]), [place] [address] [place]) [place] [address] Phone: [phone] Fax: [phone]     Social Determinants of Health (SDOH) Interventions    Readmission Risk Interventions No flowsheet data found.

## 2021-09-02 NOTE — Discharge Summary (Signed)
Physician Discharge Summary  Tina Page HFW:[id number] DOB: 09-09-1955 DOA: 08/29/2021  PCP: Tina Richards, MD  Admit date: 08/29/2021 Discharge date: 09/02/2021  Admitted From: home  Disposition:  home   Recommendations for Outpatient Follow-up:  Follow up with PCP in 1-2 weeks F/u w/ ophthalmology in 2 weeks ([facility])  Home Health: no  Equipment/Devices:  Discharge Condition:stable  CODE STATUS: full  Diet recommendation: Heart Healthy   Brief/Interim Summary: HPI was taken from Dr. Loleta Books: Tina Page is a 66 y.o. F with SLE on Plaquenil, hx AIHA, PVD s/p bilateral iliac stents 2017, HTN, COPD not on O2, hx Cdiff remote, and hypothyroidism who presented with preseptal cellulitis failed outpatient therapy.   Patient woke 3 days ago with redness and swelling and pain around her right eye at the site of a small "pimple".  She went to the ER, imaging unremarkable, she was discharged with Augmentin for preseptal cellulitis.  Over the last 2 days she took the medication but her redness, swelling, and pain have gotten worse so she came back to the ER.   In the ER: - afebrile, WBC normal - blood pressure somewhat soft on admission, resolved spontaneously - eye movements without pain, diplopia - Started on Rocephin and vancomycin - CT orbits was repeated showed no fluid collection, no evidence of orbital cellulitis.   As per Dr. Nanci Pina 08/30/21-09/02/21: Pt was found to have periorbital cellulitis and was treated w/ IV vanco, rocephin initially. Pt was later put on IV steroids which further improved the pt's periorbital cellulitis. Pt was d/c home w/ po cefdinir, doxycyline & prednisone to complete the course. Ophthalmology did see the pt and recommended continuation of IV abxs. Pt will f/u outpatient w/ [facility] in 2 weeks.   Discharge Diagnoses:  Principal Problem:   Preseptal cellulitis of right eye Active Problems:   Essential hypertension   COPD (chronic  obstructive pulmonary disease) (HCC)   PAD (peripheral artery disease) (HCC)   Hypothyroidism   Stage 3a chronic kidney disease (CKD) (HCC)   Lupus (HCC)  Periorbital cellulitis: improving in edema & erythema. Continue on IV rocephin, vanco, IV steroids. D/c home w/ po cefdinir, doxycycline & prednisone. Ophthalmology recs apprec and will f/u in 2 weeks or prn w/ [facility]   Cyst: of right lateral canthus as per ophthalmology. Observe as per ophtha   Lupus: continue to hold home dose of plaquenil    CKDIIIa:  Cr is better than baseline    PAD: continue on statin, plavix    HTN: continue to hold lisinopril, HCTZ as BP is low normal    COPD: w/o exacerbation. Continue on bronchodilators    Hypothyroidism: continue on levothyroxine   Thrombocytopenia: etiology unclear. Will continue to monitor   Obesity: BMI 30.5. Would benefit from weight loss   Discharge Instructions  Discharge Instructions     Diet - low sodium heart healthy   Complete by: As directed    Discharge instructions   Complete by: As directed    F/u w/ PCP in 1 week. F/u w/ ophthalmology at [facility] in 2 weeks. Hold home dose of plaquenil until you have completed abx & steroid course   Increase activity slowly   Complete by: As directed       Allergies as of 09/02/2021       Reactions   Ciprofloxacin Swelling   Facial swelling following single oral dose.    Codeine Anaphylaxis   Nsaids Other (See Comments)   Patient  has Hep C.        Medication List     STOP taking these medications    amoxicillin-clavulanate 875-125 MG tablet Commonly known as: AUGMENTIN       TAKE these medications    allopurinol 100 MG tablet Commonly known as: ZYLOPRIM TAKE 1 TABLET BY MOUTH EVERY DAY   cefdinir 300 MG capsule Commonly known as: OMNICEF Take 1 capsule (300 mg total) by mouth 2 (two) times daily for 5 days.   clopidogrel 75 MG tablet Commonly known as: PLAVIX Take 1 tablet (75 mg total) by  mouth daily.   doxycycline 100 MG capsule Commonly known as: MONODOX Take 1 capsule (100 mg total) by mouth 2 (two) times daily for 5 days.   folic acid 1 MG tablet Commonly known as: FOLVITE TAKE 1 TABLET (1 MG TOTAL) BY MOUTH DAILY. What changed: when to take this   hydroxychloroquine 200 MG tablet Commonly known as: PLAQUENIL Take 200 mg by mouth 2 (two) times daily.   levothyroxine 75 MCG tablet Commonly known as: SYNTHROID Take 75 mcg by mouth daily before breakfast.   lisinopril-hydrochlorothiazide 20-25 MG tablet Commonly known as: ZESTORETIC Take 1 tablet by mouth daily.   Omeprazole 20 MG Tbdd Take 20 mg by mouth daily as needed (acid reflux symptoms).   predniSONE 10 MG tablet Commonly known as: DELTASONE 40mg  daily x 3 days, 30mg  daily x 3 days, 20mg  daily x 3 days, 10mg  daily x 3 days and then stop   rosuvastatin 40 MG tablet Commonly known as: CRESTOR Take 1 tablet (40 mg total) by mouth daily.   vitamin B-12 500 MCG tablet Commonly known as: CYANOCOBALAMIN Take 500 mcg by mouth daily.        Allergies  Allergen Reactions   Ciprofloxacin Swelling    Facial swelling following single oral dose.    Codeine Anaphylaxis   Nsaids Other (See Comments)    Patient has Hep C.     Consultations: opth   Procedures/Studies: VAS US CAROTID  Result Date: 08/15/2021 Carotid Arterial Duplex Study Patient Name:  Tina Page  Date of Exam:   08/14/2021 Medical Rec #: [id number]          Accession #:    [id number] Date of Birth: 04/19/1956           Patient Gender: F Patient Age:   16 years Exam Location:   Procedure:      VAS US CAROTID Referring Phys: Santo Held FURTH --------------------------------------------------------------------------------  Indications:       Bilateral bruits. Risk Factors:      Past history of smoking, coronary artery disease, PAD. Other Factors:     Patient c/o excessive dizziness and denies all other                     cerebrovascular symptoms                     TDS due to body habitus and deep vessels. Comparison Study:  None Performing Technologist: Pilar Jarvis RDMS, RVT, RDCS  Examination Guidelines: A complete evaluation includes B-mode imaging, spectral Doppler, color Doppler, and power Doppler as needed of all accessible portions of each vessel. Bilateral testing is considered an integral part of a complete examination. Limited examinations for reoccurring indications may be performed as noted.  Right Carotid Findings: +----------+--------+--------+--------+-------------------------+--------+             PSV cm/s EDV cm/s Stenosis Plaque Description  Comments  +----------+--------+--------+--------+-------------------------+--------+  CCA Prox   58       10                                                    +----------+--------+--------+--------+-------------------------+--------+  CCA Distal 81       19                                                    +----------+--------+--------+--------+-------------------------+--------+  ICA Prox   94       24       1-39%    heterogenous and calcific           +----------+--------+--------+--------+-------------------------+--------+  ICA Mid    120      35       1-39%                                        +----------+--------+--------+--------+-------------------------+--------+  ICA Distal 103      35                                                    +----------+--------+--------+--------+-------------------------+--------+  ECA        117      22                                                    +----------+--------+--------+--------+-------------------------+--------+ +----------+--------+-------+----------------+-------------------+             PSV cm/s EDV cms Describe         Arm Pressure (mmHG)  +----------+--------+-------+----------------+-------------------+  Subclavian 115              Multiphasic, WNL 134                   +----------+--------+-------+----------------+-------------------+ +---------+--------+--+--------+--+---------+  Vertebral PSV cm/s 71 EDV cm/s 22 Antegrade  +---------+--------+--+--------+--+---------+  Left Carotid Findings: +----------+--------+--------+--------+----------------------+--------+             PSV cm/s EDV cm/s Stenosis Plaque Description     Comments  +----------+--------+--------+--------+----------------------+--------+  CCA Prox   98       18                                                 +----------+--------+--------+--------+----------------------+--------+  CCA Distal 55       17                                                 +----------+--------+--------+--------+----------------------+--------+  ICA Prox   55  14       1-39%    hyperechoic and smooth           +----------+--------+--------+--------+----------------------+--------+  ICA Mid    157      43       40-59%   hyperechoic and smooth           +----------+--------+--------+--------+----------------------+--------+  ICA Distal 92       31                                                 +----------+--------+--------+--------+----------------------+--------+  ECA        116      19                                                 +----------+--------+--------+--------+----------------------+--------+ +----------+--------+--------+----------------+-------------------+             PSV cm/s EDV cm/s Describe         Arm Pressure (mmHG)  +----------+--------+--------+----------------+-------------------+  Subclavian 135               Multiphasic, WNL 32                   +----------+--------+--------+----------------+-------------------+ +---------+--------+--+--------+--+---------+  Vertebral PSV cm/s 81 EDV cm/s 28 Antegrade  +---------+--------+--+--------+--+---------+   Summary: Right Carotid: Velocities in the right ICA are consistent with a 1-39% stenosis.                Non-hemodynamically significant plaque <50% noted in  the CCA. The                ECA appears <50% stenosed. Left Carotid: Velocities in the left ICA are consistent with a 40-59% stenosis.               Non-hemodynamically significant plaque <50% noted in the CCA. The               ECA appears <50% stenosed. Vertebrals:  Bilateral vertebral arteries demonstrate antegrade flow. Subclavians: Normal flow hemodynamics were seen in bilateral subclavian              arteries. *See table(s) above for measurements and observations. Suggest follow up study in 12 months. Electronically signed by Quay Burow MD on 08/15/2021 at 6:34:09 AM.    Final    CT Orbits W Contrast  Result Date: 08/29/2021 CLINICAL DATA:  Orbital cellulitis suspected EXAM: CT ORBITS WITH CONTRAST TECHNIQUE: Multidetector CT images was performed according to the standard protocol following intravenous contrast administration. CONTRAST:  66mL OMNIPAQUE IOHEXOL 300 MG/ML  SOLN COMPARISON:  CT orbits 08/27/2021. FINDINGS: Orbits: Right preseptal/periorbital soft tissue thickening and edema, compatible with cellulitis. Findings are mildly progressed from 12/28. No discrete, drainable fluid collection or evidence of postseptal extension. The globes are within normal limits and symmetric. The extraocular muscles and lacrimal glands are unremarkable. No proptosis. Small nonspecific locule of gas along the superior aspect of the right orbit. Visible paranasal sinuses: Clear. Soft tissues: Right preseptal/periorbital soft tissue thickening/stranding, as described above. Otherwise, unremarkable visualized soft tissues. Osseous: No fracture or aggressive lesion. Limited intracranial: Unremarkable. IMPRESSION: Mildly progressive right preseptal/periorbital soft tissue thickening and edema, compatible with cellulitis. No discrete, drainable fluid collection or evidence  of postseptal extension. Electronically Signed   By: Margaretha Sheffield M.D.   On: 08/29/2021 16:56   CT Orbits W Contrast  Result Date:  08/27/2021 CLINICAL DATA:  Right eye swelling EXAM: CT ORBITS WITH CONTRAST TECHNIQUE: Multidetector CT images was performed according to the standard protocol following intravenous contrast administration. CONTRAST:  14mL OMNIPAQUE IOHEXOL 300 MG/ML  SOLN COMPARISON:  No pertinent prior exam. FINDINGS: Orbits: There is mild right periorbital soft tissue swelling. Otherwise, both orbits are normal Visualized sinuses:  No fluid levels or advanced mucosal thickening. Soft tissues: Normal. Limited intracranial: Normal. IMPRESSION: Mild right periorbital soft tissue swelling, possibly indicating periorbital cellulitis. No orbital (postseptal) cellulitis. Electronically Signed   By: Ulyses Jarred M.D.   On: 08/27/2021 21:02   (Echo, Carotid, EGD, Colonoscopy, ERCP)    Subjective: Pt c/o fatigue    Discharge Exam: Vitals:   09/02/21 0438 09/02/21 0801  BP: (!) 116/50 (!) 140/57  Pulse: 79 (!) 59  Resp: 16 18  Temp: 98 F (36.7 C) 98 F (36.7 C)  SpO2: 94% 100%   Vitals:   09/01/21 1533 09/01/21 2027 09/02/21 0438 09/02/21 0801  BP: (!) 154/70 (!) 148/72 (!) 116/50 (!) 140/57  Pulse: 69 (!) 59 79 (!) 59  Resp: 16 16 16 18   Temp: 97.6 F (36.4 C) (!) 97.4 F (36.3 C) 98 F (36.7 C) 98 F (36.7 C)  TempSrc: Oral Oral Oral Oral  SpO2: 98% 99% 94% 100%  Weight:      Height:        General: Pt is alert, awake, not in acute distress. Improved edema & erythema of periorbital cellulitis of right eye  Cardiovascular: S1/S2 +, no rubs, no gallops Respiratory: CTA bilaterally, no wheezing, no rhonchi Abdominal: Soft, NT, ND, bowel sounds + Extremities: no edema, no cyanosis    The results of significant diagnostics from this hospitalization (including imaging, microbiology, ancillary and laboratory) are listed below for reference.     Microbiology: Recent Results (from the past 240 hour(s))  Resp Panel by RT-PCR (Flu A&B, Covid) Nasopharyngeal Swab     Status: None   Collection Time:  08/29/21  6:41 PM   Specimen: Nasopharyngeal Swab; Nasopharyngeal(NP) swabs in vial transport medium  Result Value Ref Range Status   SARS Coronavirus 2 by RT PCR NEGATIVE NEGATIVE Final    Comment: (NOTE) SARS-CoV-2 target nucleic acids are NOT DETECTED.  The SARS-CoV-2 RNA is generally detectable in upper respiratory specimens during the acute phase of infection. The lowest concentration of SARS-CoV-2 viral copies this assay can detect is 138 copies/mL. A negative result does not preclude SARS-Cov-2 infection and should not be used as the sole basis for treatment or other patient management decisions. A negative result may occur with  improper specimen collection/handling, submission of specimen other than nasopharyngeal swab, presence of viral mutation(s) within the areas targeted by this assay, and inadequate number of viral copies(<138 copies/mL). A negative result must be combined with clinical observations, patient history, and epidemiological information. The expected result is Negative.  Fact Sheet for Patients:  EntrepreneurPulse.com.au  Fact Sheet for Healthcare Providers:  IncredibleEmployment.be  This test is no t yet approved or cleared by the [place] FDA and  has been authorized for detection and/or diagnosis of SARS-CoV-2 by FDA under an Emergency Use Authorization (EUA). This EUA will remain  in effect (meaning this test can be used) for the duration of the COVID-19 declaration under Section 564(b)(1) of the Act, 21 U.S.C.section 360bbb-3(b)(1), unless  the authorization is terminated  or revoked sooner.       Influenza A by PCR NEGATIVE NEGATIVE Final   Influenza B by PCR NEGATIVE NEGATIVE Final    Comment: (NOTE) The Xpert Xpress SARS-CoV-2/FLU/RSV plus assay is intended as an aid in the diagnosis of influenza from Nasopharyngeal swab specimens and should not be used as a sole basis for treatment. Nasal washings  and aspirates are unacceptable for Xpert Xpress SARS-CoV-2/FLU/RSV testing.  Fact Sheet for Patients: EntrepreneurPulse.com.au  Fact Sheet for Healthcare Providers: IncredibleEmployment.be  This test is not yet approved or cleared by the [place] FDA and has been authorized for detection and/or diagnosis of SARS-CoV-2 by FDA under an Emergency Use Authorization (EUA). This EUA will remain in effect (meaning this test can be used) for the duration of the COVID-19 declaration under Section 564(b)(1) of the Act, 21 U.S.C. section 360bbb-3(b)(1), unless the authorization is terminated or revoked.  Performed at [facility], [address] [place], [place] [address]   Blood culture (routine x 2)     Status: None (Preliminary result)   Collection Time: 08/29/21  7:09 PM   Specimen: BLOOD  Result Value Ref Range Status   Specimen Description BLOOD BLOOD LEFT HAND  Final   Special Requests   Final    BOTTLES DRAWN AEROBIC AND ANAEROBIC Blood Culture adequate volume   Culture   Final    NO GROWTH 4 DAYS Performed at [facility], [address], [place], [place] [address]    Report Status PENDING  Incomplete  Blood culture (routine x 2)     Status: None (Preliminary result)   Collection Time: 08/29/21  7:18 PM   Specimen: BLOOD  Result Value Ref Range Status   Specimen Description BLOOD BLOOD RIGHT HAND  Final   Special Requests   Final    BOTTLES DRAWN AEROBIC AND ANAEROBIC Blood Culture adequate volume   Culture   Final    NO GROWTH 4 DAYS Performed at [facility], [address], [place], [place] [address]    Report Status PENDING  Incomplete     Labs: BNP (last 3 results) No results for input(s): BNP in the last 8760 hours. Basic Metabolic Panel: Recent Labs  Lab 08/29/21 1218 08/30/21 0546 08/31/21 0552 09/01/21 0614 09/02/21 0554  NA 139 138 138 138 140  K 3.7 4.1 4.0 4.3 4.4  CL 101 103  102 104 110  CO2 31 26 25 24 23   GLUCOSE 76 98 90 156* 175*  BUN 23 27* 23 21 28*  CREATININE 1.46* 1.18* 1.05* 0.98 0.88  CALCIUM 9.5 8.8* 9.2 9.3 9.2   Liver Function Tests: No results for input(s): AST, ALT, ALKPHOS, BILITOT, PROT, ALBUMIN in the last 168 hours. No results for input(s): LIPASE, AMYLASE in the last 168 hours. No results for input(s): AMMONIA in the last 168 hours. CBC: Recent Labs  Lab 08/27/21 2056 08/29/21 1218 08/30/21 0546 08/31/21 0552 09/01/21 0614 09/02/21 0554  WBC 6.2 8.6 6.7 4.9 6.4 11.8*  NEUTROABS 4.3  --   --   --   --   --   HGB 11.3* 12.5 11.5* 11.8* 12.9 12.0  HCT 34.5* 39.4 35.7* 36.3 39.4 36.7  MCV 92.0 91.0 90.6 89.2 89.5 89.1  PLT 111* 145* 127* 116* 132* 132*   Cardiac Enzymes: No results for input(s): CKTOTAL, CKMB, CKMBINDEX, TROPONINI in the last 168 hours. BNP: Invalid input(s): POCBNP CBG: No results for input(s): GLUCAP in the last  168 hours. D-Dimer No results for input(s): DDIMER in the last 72 hours. Hgb A1c No results for input(s): HGBA1C in the last 72 hours. Lipid Profile No results for input(s): CHOL, HDL, LDLCALC, TRIG, CHOLHDL, LDLDIRECT in the last 72 hours. Thyroid function studies No results for input(s): TSH, T4TOTAL, T3FREE, THYROIDAB in the last 72 hours.  Invalid input(s): FREET3 Anemia work up No results for input(s): VITAMINB12, FOLATE, FERRITIN, TIBC, IRON, RETICCTPCT in the last 72 hours. Urinalysis    Component Value Date/Time   COLORURINE YELLOW (A) 11/19/2020 2017   APPEARANCEUR CLEAR (A) 11/19/2020 2017   LABSPEC 1.019 11/19/2020 2017   PHURINE 5.0 11/19/2020 2017   GLUCOSEU NEGATIVE 11/19/2020 2017   HGBUR NEGATIVE 11/19/2020 2017   BILIRUBINUR NEGATIVE 11/19/2020 2017   KETONESUR NEGATIVE 11/19/2020 2017   PROTEINUR NEGATIVE 11/19/2020 2017   NITRITE NEGATIVE 11/19/2020 2017   LEUKOCYTESUR NEGATIVE 11/19/2020 2017   Sepsis Labs Invalid input(s): PROCALCITONIN,  WBC,   LACTICIDVEN Microbiology Recent Results (from the past 240 hour(s))  Resp Panel by RT-PCR (Flu A&B, Covid) Nasopharyngeal Swab     Status: None   Collection Time: 08/29/21  6:41 PM   Specimen: Nasopharyngeal Swab; Nasopharyngeal(NP) swabs in vial transport medium  Result Value Ref Range Status   SARS Coronavirus 2 by RT PCR NEGATIVE NEGATIVE Final    Comment: (NOTE) SARS-CoV-2 target nucleic acids are NOT DETECTED.  The SARS-CoV-2 RNA is generally detectable in upper respiratory specimens during the acute phase of infection. The lowest concentration of SARS-CoV-2 viral copies this assay can detect is 138 copies/mL. A negative result does not preclude SARS-Cov-2 infection and should not be used as the sole basis for treatment or other patient management decisions. A negative result may occur with  improper specimen collection/handling, submission of specimen other than nasopharyngeal swab, presence of viral mutation(s) within the areas targeted by this assay, and inadequate number of viral copies(<138 copies/mL). A negative result must be combined with clinical observations, patient history, and epidemiological information. The expected result is Negative.  Fact Sheet for Patients:  EntrepreneurPulse.com.au  Fact Sheet for Healthcare Providers:  IncredibleEmployment.be  This test is no t yet approved or cleared by the [place] FDA and  has been authorized for detection and/or diagnosis of SARS-CoV-2 by FDA under an Emergency Use Authorization (EUA). This EUA will remain  in effect (meaning this test can be used) for the duration of the COVID-19 declaration under Section 564(b)(1) of the Act, 21 U.S.C.section 360bbb-3(b)(1), unless the authorization is terminated  or revoked sooner.       Influenza A by PCR NEGATIVE NEGATIVE Final   Influenza B by PCR NEGATIVE NEGATIVE Final    Comment: (NOTE) The Xpert Xpress SARS-CoV-2/FLU/RSV plus  assay is intended as an aid in the diagnosis of influenza from Nasopharyngeal swab specimens and should not be used as a sole basis for treatment. Nasal washings and aspirates are unacceptable for Xpert Xpress SARS-CoV-2/FLU/RSV testing.  Fact Sheet for Patients: EntrepreneurPulse.com.au  Fact Sheet for Healthcare Providers: IncredibleEmployment.be  This test is not yet approved or cleared by the [place] FDA and has been authorized for detection and/or diagnosis of SARS-CoV-2 by FDA under an Emergency Use Authorization (EUA). This EUA will remain in effect (meaning this test can be used) for the duration of the COVID-19 declaration under Section 564(b)(1) of the Act, 21 U.S.C. section 360bbb-3(b)(1), unless the authorization is terminated or revoked.  Performed at [facility], [address], [place], [place] [address]  Blood culture (routine x 2)     Status: None (Preliminary result)   Collection Time: 08/29/21  7:09 PM   Specimen: BLOOD  Result Value Ref Range Status   Specimen Description BLOOD BLOOD LEFT HAND  Final   Special Requests   Final    BOTTLES DRAWN AEROBIC AND ANAEROBIC Blood Culture adequate volume   Culture   Final    NO GROWTH 4 DAYS Performed at [facility], [address], [place], [place] [address]    Report Status PENDING  Incomplete  Blood culture (routine x 2)     Status: None (Preliminary result)   Collection Time: 08/29/21  7:18 PM   Specimen: BLOOD  Result Value Ref Range Status   Specimen Description BLOOD BLOOD RIGHT HAND  Final   Special Requests   Final    BOTTLES DRAWN AEROBIC AND ANAEROBIC Blood Culture adequate volume   Culture   Final    NO GROWTH 4 DAYS Performed at [facility], [address] [place], [place] [address]    Report Status PENDING  Incomplete     Time coordinating discharge: Over 30 minutes  SIGNED:   Wyvonnia Dusky, MD  [facility] 09/02/2021, 1:08 PM Pager   If 7PM-7AM, please contact night-coverage

## 2021-09-03 LAB — CULTURE, BLOOD (ROUTINE X 2)
Culture: NO GROWTH
Culture: NO GROWTH
Special Requests: ADEQUATE
Special Requests: ADEQUATE

## 2021-09-04 ENCOUNTER — Ambulatory Visit: Payer: BC Managed Care – PPO | Admitting: Medical

## 2021-09-05 ENCOUNTER — Other Ambulatory Visit: Payer: Self-pay | Admitting: Family Medicine

## 2021-09-05 DIAGNOSIS — Z1231 Encounter for screening mammogram for malignant neoplasm of breast: Secondary | ICD-10-CM

## 2021-10-30 ENCOUNTER — Ambulatory Visit
Admission: RE | Admit: 2021-10-30 | Discharge: 2021-10-30 | Disposition: A | Discharge status: Home / Self Care | Payer: BC Managed Care – PPO | Source: Ambulatory Visit | Attending: Family Medicine | Admitting: Family Medicine

## 2021-10-30 ENCOUNTER — Other Ambulatory Visit: Payer: Self-pay

## 2021-10-30 DIAGNOSIS — Z1231 Encounter for screening mammogram for malignant neoplasm of breast: Secondary | ICD-10-CM | POA: Insufficient documentation

## 2021-11-03 ENCOUNTER — Other Ambulatory Visit: Payer: Self-pay | Admitting: *Deleted

## 2021-11-03 ENCOUNTER — Telehealth: Payer: Self-pay | Admitting: *Deleted

## 2021-11-03 DIAGNOSIS — Z87891 Personal history of nicotine dependence: Secondary | ICD-10-CM

## 2021-11-03 NOTE — Telephone Encounter (Signed)
Left message for pt to call back to schedule f/u lung screening CT scan.  ?

## 2021-11-17 ENCOUNTER — Telehealth: Payer: Self-pay | Admitting: *Deleted

## 2021-11-17 NOTE — Telephone Encounter (Signed)
Patient called to cancel appointment with Dr. Tasia Catchings. ?

## 2021-11-18 ENCOUNTER — Inpatient Hospital Stay: Payer: BC Managed Care – PPO

## 2021-11-18 ENCOUNTER — Ambulatory Visit: Payer: BC Managed Care – PPO

## 2021-11-18 ENCOUNTER — Encounter: Payer: Self-pay | Admitting: Hematology and Oncology

## 2021-11-20 ENCOUNTER — Inpatient Hospital Stay: Payer: BC Managed Care – PPO | Admitting: Oncology

## 2021-11-25 ENCOUNTER — Telehealth: Payer: Self-pay | Admitting: *Deleted

## 2021-11-25 NOTE — Telephone Encounter (Signed)
Patient called [facility] center scheduling line. Call returned. Pt requested apt with lab/Dr. Tasia Catchings to be r/s. ? ?Reveiwed chart- per provider- pt cnl previously for apts. Per Dr. Collie Siad notes- Labs  (cbc,cmp, retic panel, b12) - labs to be drawn 1-2 days prior to md apt. ? ?Apt r/s for lab 12/05/21 at 1045 am ?Apt r/s for Dr. Tasia Catchings on 12/08/21 at 245. ? ?Note: pt preferred only to see Dr. Tasia Catchings. I offered a sooner apt with an NP, but pt declined as she would like to discuss lab work up/findings from rheumatology with Dr. Tasia Catchings. ?

## 2021-12-02 ENCOUNTER — Ambulatory Visit
Admission: RE | Admit: 2021-12-02 | Discharge: 2021-12-02 | Disposition: A | Discharge status: Home / Self Care | Payer: BC Managed Care – PPO | Source: Ambulatory Visit | Attending: Acute Care | Admitting: Acute Care

## 2021-12-02 DIAGNOSIS — Z87891 Personal history of nicotine dependence: Secondary | ICD-10-CM | POA: Insufficient documentation

## 2021-12-03 ENCOUNTER — Telehealth: Payer: Self-pay | Admitting: Acute Care

## 2021-12-03 NOTE — Telephone Encounter (Signed)
I have attempted to call the patient with the results of her low-dose CT.  There was no answer.  I have left a detailed HIPAA compliant message on the patient's voicemail requesting that they call the office for follow-up of their scan results.  I left the office contact number on the voicemail.  If we do not hear from patient by tomorrow we will call again. ? ?

## 2021-12-03 NOTE — Telephone Encounter (Signed)
Call report from [facility]  ? ?IMPRESSION: ?1. New right middle lobe pulmonary nodule with an aggressive ?appearance categorized as Lung-RADS 4BS, suspicious. Additional ?imaging evaluation or consultation with Pulmonology or Thoracic ?Surgery recommended. ?2. The "S" modifier above refers to potentially clinically ?significant non lung cancer related findings. Specifically, there is ?aortic atherosclerosis, in addition to left main and 2 vessel ?coronary artery disease. Please note that although the presence of ?coronary artery calcium documents the presence of coronary artery ?disease, the severity of this disease and any potential stenosis ?cannot be assessed on this non-gated CT examination. Assessment for ?potential risk factor modification, dietary therapy or pharmacologic ?therapy may be warranted, if clinically indicated. ?3. Mild diffuse bronchial wall thickening with mild centrilobular ?and paraseptal emphysema; imaging findings suggestive of underlying ?COPD. ?  ?These results will be called to the ordering clinician or ?representative by the Radiologist Assistant, and communication ?documented in the PACS or [employer]. ?  ?Aortic Atherosclerosis (ICD10-I70.0) and Emphysema (ICD10-J43.9). ?  ?  ?Electronically Signed ?  By: Vinnie Langton M.D. ?  On: 12/03/2021 09:36 ?

## 2021-12-04 ENCOUNTER — Ambulatory Visit (INDEPENDENT_AMBULATORY_CARE_PROVIDER_SITE_OTHER): Payer: BC Managed Care – PPO | Admitting: Vascular Surgery

## 2021-12-04 ENCOUNTER — Other Ambulatory Visit: Payer: BC Managed Care – PPO

## 2021-12-04 ENCOUNTER — Ambulatory Visit (INDEPENDENT_AMBULATORY_CARE_PROVIDER_SITE_OTHER): Payer: BC Managed Care – PPO

## 2021-12-04 ENCOUNTER — Telehealth: Payer: Self-pay | Admitting: Acute Care

## 2021-12-04 DIAGNOSIS — R911 Solitary pulmonary nodule: Secondary | ICD-10-CM

## 2021-12-04 DIAGNOSIS — I70213 Atherosclerosis of native arteries of extremities with intermittent claudication, bilateral legs: Secondary | ICD-10-CM | POA: Diagnosis not present

## 2021-12-04 NOTE — Telephone Encounter (Signed)
I have called the patient with the results of her low dose CT Chest. I explained that her scan revealed a nodule in the RML that is 1.44 cm, and needs to be evaluated. I explained that we will order a PET scan and that she will get a call to get this scheduled. I also explained she will be scheduled to see Dr. Patsey Berthold to go over the scan once it has been  completed. She is in agreement with this plan.  ? ?

## 2021-12-04 NOTE — Telephone Encounter (Signed)
Will contact patient to schedule OV once PET has been scheduled.  

## 2021-12-04 NOTE — Telephone Encounter (Signed)
See telephone note 12/04/2021 ?

## 2021-12-05 ENCOUNTER — Inpatient Hospital Stay: Payer: BC Managed Care – PPO | Attending: Oncology

## 2021-12-05 DIAGNOSIS — R918 Other nonspecific abnormal finding of lung field: Secondary | ICD-10-CM | POA: Insufficient documentation

## 2021-12-05 DIAGNOSIS — Z87891 Personal history of nicotine dependence: Secondary | ICD-10-CM | POA: Insufficient documentation

## 2021-12-05 DIAGNOSIS — E538 Deficiency of other specified B group vitamins: Secondary | ICD-10-CM | POA: Insufficient documentation

## 2021-12-05 DIAGNOSIS — D5911 Warm autoimmune hemolytic anemia: Secondary | ICD-10-CM | POA: Insufficient documentation

## 2021-12-05 DIAGNOSIS — B1910 Unspecified viral hepatitis B without hepatic coma: Secondary | ICD-10-CM | POA: Insufficient documentation

## 2021-12-05 LAB — COMPREHENSIVE METABOLIC PANEL
ALT: 14 U/L (ref 0–44)
AST: 22 U/L (ref 15–41)
Albumin: 4.1 g/dL (ref 3.5–5.0)
Alkaline Phosphatase: 71 U/L (ref 38–126)
Anion gap: 6 (ref 5–15)
BUN: 32 mg/dL — ABNORMAL HIGH (ref 8–23)
CO2: 29 mmol/L (ref 22–32)
Calcium: 9.3 mg/dL (ref 8.9–10.3)
Chloride: 103 mmol/L (ref 98–111)
Creatinine, Ser: 1.5 mg/dL — ABNORMAL HIGH (ref 0.44–1.00)
GFR, Estimated: 39 mL/min — ABNORMAL LOW (ref >60)
Glucose, Bld: 103 mg/dL — ABNORMAL HIGH (ref 70–99)
Potassium: 3.5 mmol/L (ref 3.5–5.1)
Sodium: 138 mmol/L (ref 135–145)
Total Bilirubin: 1.1 mg/dL (ref 0.3–1.2)
Total Protein: 7.3 g/dL (ref 6.5–8.1)

## 2021-12-05 LAB — CBC WITH DIFFERENTIAL/PLATELET
Abs Immature Granulocytes: 0.04 10*3/uL (ref 0.00–0.07)
Basophils Absolute: 0.1 10*3/uL (ref 0.0–0.1)
Basophils Relative: 1 %
Eosinophils Absolute: 0.1 10*3/uL (ref 0.0–0.5)
Eosinophils Relative: 2 %
HCT: 31.4 % — ABNORMAL LOW (ref 36.0–46.0)
Hemoglobin: 9.9 g/dL — ABNORMAL LOW (ref 12.0–15.0)
Immature Granulocytes: 1 %
Lymphocytes Relative: 15 %
Lymphs Abs: 0.7 10*3/uL (ref 0.7–4.0)
MCH: 30 pg (ref 26.0–34.0)
MCHC: 31.5 g/dL (ref 30.0–36.0)
MCV: 95.2 fL (ref 80.0–100.0)
Monocytes Absolute: 0.3 10*3/uL (ref 0.1–1.0)
Monocytes Relative: 6 %
Neutro Abs: 3.3 10*3/uL (ref 1.7–7.7)
Neutrophils Relative %: 75 %
Platelets: 120 10*3/uL — ABNORMAL LOW (ref 150–400)
RBC: 3.3 MIL/uL — ABNORMAL LOW (ref 3.87–5.11)
RDW: 15.9 % — ABNORMAL HIGH (ref 11.5–15.5)
WBC: 4.4 10*3/uL (ref 4.0–10.5)
nRBC: 0 % (ref 0.0–0.2)

## 2021-12-05 LAB — RETIC PANEL
Immature Retic Fract: 21.4 % — ABNORMAL HIGH (ref 2.3–15.9)
RBC.: 3.24 MIL/uL — ABNORMAL LOW (ref 3.87–5.11)
Retic Count, Absolute: 145.2 10*3/uL (ref 19.0–186.0)
Retic Ct Pct: 4.5 % — ABNORMAL HIGH (ref 0.4–3.1)
Reticulocyte Hemoglobin: 33.9 pg (ref >27.9)

## 2021-12-05 LAB — VITAMIN B12: Vitamin B-12: 874 pg/mL (ref 180–914)

## 2021-12-08 ENCOUNTER — Inpatient Hospital Stay: Payer: BC Managed Care – PPO | Admitting: Oncology

## 2021-12-08 ENCOUNTER — Inpatient Hospital Stay: Payer: BC Managed Care – PPO

## 2021-12-08 ENCOUNTER — Ambulatory Visit: Payer: BC Managed Care – PPO | Admitting: Oncology

## 2021-12-08 ENCOUNTER — Encounter: Payer: Self-pay | Admitting: Oncology

## 2021-12-08 VITALS — BP 154/71 | HR 80 | Temp 96.6°F | Ht 63.0 in | Wt 171.0 lb

## 2021-12-08 DIAGNOSIS — R918 Other nonspecific abnormal finding of lung field: Secondary | ICD-10-CM

## 2021-12-08 DIAGNOSIS — D5911 Warm autoimmune hemolytic anemia: Secondary | ICD-10-CM

## 2021-12-08 DIAGNOSIS — R7989 Other specified abnormal findings of blood chemistry: Secondary | ICD-10-CM

## 2021-12-08 DIAGNOSIS — D696 Thrombocytopenia, unspecified: Secondary | ICD-10-CM

## 2021-12-08 DIAGNOSIS — D649 Anemia, unspecified: Secondary | ICD-10-CM

## 2021-12-08 LAB — IRON AND TIBC
Iron: 62 ug/dL (ref 28–170)
Saturation Ratios: 21 % (ref 10.4–31.8)
TIBC: 291 ug/dL (ref 250–450)
UIBC: 229 ug/dL

## 2021-12-08 LAB — HEPATIC FUNCTION PANEL
ALT: 12 U/L (ref 0–44)
AST: 17 U/L (ref 15–41)
Albumin: 4.1 g/dL (ref 3.5–5.0)
Alkaline Phosphatase: 70 U/L (ref 38–126)
Bilirubin, Direct: 0.2 mg/dL (ref 0.0–0.2)
Indirect Bilirubin: 0.5 mg/dL (ref 0.3–0.9)
Total Bilirubin: 0.7 mg/dL (ref 0.3–1.2)
Total Protein: 6.9 g/dL (ref 6.5–8.1)

## 2021-12-08 LAB — FERRITIN: Ferritin: 297 ng/mL (ref 11–307)

## 2021-12-08 LAB — CBC WITH DIFFERENTIAL/PLATELET
Abs Immature Granulocytes: 0.01 10*3/uL (ref 0.00–0.07)
Basophils Absolute: 0.1 10*3/uL (ref 0.0–0.1)
Basophils Relative: 1 %
Eosinophils Absolute: 0.1 10*3/uL (ref 0.0–0.5)
Eosinophils Relative: 3 %
HCT: 29.1 % — ABNORMAL LOW (ref 36.0–46.0)
Hemoglobin: 9.1 g/dL — ABNORMAL LOW (ref 12.0–15.0)
Immature Granulocytes: 0 %
Lymphocytes Relative: 18 %
Lymphs Abs: 0.8 10*3/uL (ref 0.7–4.0)
MCH: 30.1 pg (ref 26.0–34.0)
MCHC: 31.3 g/dL (ref 30.0–36.0)
MCV: 96.4 fL (ref 80.0–100.0)
Monocytes Absolute: 0.3 10*3/uL (ref 0.1–1.0)
Monocytes Relative: 8 %
Neutro Abs: 3.2 10*3/uL (ref 1.7–7.7)
Neutrophils Relative %: 70 %
Platelets: 125 10*3/uL — ABNORMAL LOW (ref 150–400)
RBC: 3.02 MIL/uL — ABNORMAL LOW (ref 3.87–5.11)
RDW: 16.3 % — ABNORMAL HIGH (ref 11.5–15.5)
WBC: 4.5 10*3/uL (ref 4.0–10.5)
nRBC: 0 % (ref 0.0–0.2)

## 2021-12-08 LAB — VITAMIN B12: Vitamin B-12: 744 pg/mL (ref 180–914)

## 2021-12-08 LAB — DAT, POLYSPECIFIC AHG (ARMC ONLY)
DAT, IgG: POSITIVE
DAT, complement: POSITIVE
Polyspecific AHG test: POSITIVE

## 2021-12-08 LAB — RETIC PANEL
Immature Retic Fract: 22.5 % — ABNORMAL HIGH (ref 2.3–15.9)
RBC.: 3.01 MIL/uL — ABNORMAL LOW (ref 3.87–5.11)
Retic Count, Absolute: 149.3 10*3/uL (ref 19.0–186.0)
Retic Ct Pct: 5 % — ABNORMAL HIGH (ref 0.4–3.1)
Reticulocyte Hemoglobin: 32.3 pg (ref >27.9)

## 2021-12-08 LAB — FOLATE: Folate: 24 ng/mL (ref >5.9)

## 2021-12-08 LAB — TECHNOLOGIST SMEAR REVIEW: Plt Morphology: DECREASED

## 2021-12-08 LAB — HEPATITIS PANEL, ACUTE
HCV Ab: REACTIVE — AB
Hep A IgM: NONREACTIVE
Hep B C IgM: NONREACTIVE
Hepatitis B Surface Ag: NONREACTIVE

## 2021-12-08 LAB — LACTATE DEHYDROGENASE: LDH: 191 U/L (ref 98–192)

## 2021-12-08 NOTE — Progress Notes (Signed)
?Hematology oncology progress note ? ? ?Clinic Day:  12/08/2021 ? ?Referring physician: Frazier Richards, MD ? ?Chief Complaint: Tina Page is a 66 y.o. 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 ?PERTINENT HEMATOLOGY HISTORY ?Patient previously followed up by Dr.Corcoran, patient switched care to me on 02/15/21 ?Extensive medical record review was performed by me ? ? ?Autoimmune hemolytic anemia diagnosis in 2017 ?her work-up revealed a cold autoantibody (IgG and complement).  Reticulocyte count was 11.3%.  Ferritin was 562.  Iron studies revealed a saturation of 20% and a TIBC of 214 (low).  B12 was 231 (low normal).  Folate was 42.   Peripheral smear revealed rouleaux formation.    ?Additional testing included the following + studies:  hepatitis C antibody, hepatitis B core antibody, CMV IgM, and EBV VCA (IgM and IgG).  Hepatitis B by PCR was negative.  Hepatitis C RNA was negative.  Mycoplasma pneumonia IgM was negative.  Reticulocyte count was 11.3% (high) indicating appropriate marrow response.  C3 and C4 were normal.  Cold agglutinin titer was negative x 2.  Negative studies included:  ANA, hepatitis B surface antigen, SPEP, and free light chain ratio.   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  MMA was initially 330 (normal).  Repeat MMA was elevated on 08/01/2016 confirming B12 deficiency.  Hepatitis B surface antibody was positive and hepatitis B surface antigen was negative on 08/06/2016. ?  ?07/07/2016  Chest, abdomen, and pelvis CT angiogram on revealed moderate diffuse atherosclerotic vascular disease of the abdominal aorta with severe stenosis of the left common iliac artery with suspected short segment occlusion. There was no adenopathy.  Spleen was normal.   ?04/15/2017 Abd US revealed a normal spleen and sludge in the gallbladder.  The liver was echogenic consistent fatty infiltration and/or  hepatocellular disease. ? ?For hemolytic anemia treatments, in 2017, She has received multiple units of  warmed PRBCs to date.  ?08/01/2016-03/12/2017, steroid treatment with prednisone.  She is also on folic acid 1 mg daily. ?Borderline low vitamin B12 level on 07/09/2016.  Patient has been on oral vitamin B12 supplementation. ? ?10/12/2017  positive warm autoantibody.  LDH and bilirubin were normal.   She began prednisone 10 mg on 12/07/2017 (discontinued 03/2018).  She received Rituxan weekly x 4 (last dose 06/16/2018).  Entecavir was discontinued on 07/06/2019. ? ?06/12/2020 developed recurrent anemia.  Hematocrit was 27.6, hemoglobin 8.4, MCV 104.5, platelets 138,000, WBC 3,400 (ANC 2,300). Creatinine was 1.17 (CrCl 50 ml/min).  Positive warm autoantibody  Reticulocyte count was 7.1%.  LDH was 204 (98-192).  06/16/2020, began prednisone 1 mg/kilogram treatments, 07/24/2020, prednisone was tapered down to 20 mg. ? She received Rituxan 1000 mg on day 1 and 15 (07/15/2020 and 07/29/2020).  She began entecavir on 06/28/2020.  Prednisone was tapered off.  She was on Septra for PCP prophylaxis which is now currently on hold  ? ? ?Other medical problems ?07/10/2016.  She underwent PTCA and stent placement in right and left common iliac arteries and left external iliac artery. ?She is on Plavix.   ?03/19/2015 EGD revealed gastritis in the body and antrum.  Colonoscopy revealed one hyperplastic polyp.  Repeat EGD on 07/08/2016 was normal.  No evidence of bleeding. ? ?History of she developed peri-oral and intranasal herpes simplex-1.  She was treated with valacyclovir and doxycycline on 10/19/2016.   ?10/26/2017. She developed flu like symptoms.  Symptoms included cough, myalgias, and fever (tmax unknown).  She was prescribed Mucinex, Tamiflu, and amoxicillin.  She only took amoxicillin x 5 days.  She developed increased liver function tests on 11/02/2017. CMV IgG was positive.  EBV VCA IgG, NA IgG, early antigen antibody  IgG were elevated.  EBV VCA IgM was < 36.  Testing was c/w a convalescence/past infection or reactivated infection.  LFTs normalized on 11/15/2017.  Smooth muscle antibody was 39 (high) on 11/10/2017 and 34 (high) on 11/30/2017.  ?  ? ?Chest CT on 11/16/2017 revealed a 2.2 x 2.0 x 2.4 spiculated RIGHT middle lobe mass.  There was tiny nonspecific upper lobe pulmonary nodules greater on RIGHT, largest 3 mm, of uncertain etiology.  There was additional 8 x 8 mm opacity in the posterior sulcus of the RIGHT lower lobe. PET scan on 11/22/2017 revealed a 2 cm hypermetabolic right middle lobe lung mass (SUV 3.4) consistent  with primary lung neoplasm.  There were no findings for mediastinal/hilar lymphadenopathy or metastatic disease.  There were areas of hypermetabolism involving the left oblique abdominal muscles and the anorectal junction. ?PFTs on 11/18/2017 revealed an FEV1 of 1.22 liters (51%).  DLCO adj was 6.8 mL/mmHg/min (32%). ?  ?ENB done on 12/07/2017. Cytology was negative for malignancy. Pathology demonstrated organizing pneumonia and chronic bronchitis. Pathologist commented that organizing pneumonia spanned about 2 mm in one fragment. Cases of focal organizing pneumonia with hypermetabolism on FDG-PET have been described, but changes of this type can also be adjacent to a neoplasm. Patient prescribed a daily dose of Prednisone 10 mg  ? ?07/24/2020  repeat  chest CT on  revealed Lung-RADS 2, benign appearance or behavior. There was emphysema, aortic atherosclerosis, and coronary artery calcifications. ? ?diarrhea.  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  She received azithromycin x 3 days.  She received ciprofloxacin x 10 days without improvement.  She has seen ID in [place].  She began Bactrim on 03/04/2018 (discontinued on 03/11/2018) secondary to increase in creatine. ?  ?Chronic kidney disease Urinalysis on 11/15/2017 revealed revealed no hemoglobin, bilirubin or  active sediment. ?  ?Liver function tests increased on 02/15/2019.  Work-up on 02/16/2019 revealed hepatitis B E antibody positive.  Negative studies included: hepatitis A total antibody, hepatitis A IgM, hepatitis B surface antigen, hepatitis B E antigen.  Hepatitis B DNA was not detected.  Folate was 80.5 and vitamin B12 was 443. ? ?She has received her vaccinations:  She has completed the PCV13, PPSV23, and HiB.  She received Menvio (quadrivalent meningoccal vaccine) and Bexero (univalent serogroup B meningoccal vaccine) on 07/06/2019. ? ?She was diagnosed with C difficile + diarrhea on 08/12/2020.  She completed a course of oral vancomycin.  Stool was negative for C. difficile on 08/27/2020. ? ?11/17/2020 - 11/20/2020 [facility] admission with left L5 varicella zoster.  Lesion was positive for VZV. She was treated with ceftazidime and a valacyclovir.  She was discharged on 9 days of Valtrex 1 gm 3 times daily followed by 500 mg daily indefinitely for suppression and doxycycline. ? ?History of discoid lupus ? ?#Positive hepatitis B core antibody ?Previously on entecavir 0.5 mg daily, currently she has finished a course. ?Dose adjusted based on renal function. ?CrCl 30-49 ml/min   50% dosing (0.5 mg QOD) ?CrCl >= 50 ml/min 100% dosing (0.5 mg a day). ?With her kidney function currently QOD ?She has completed course of Entecavir and stopped 08/14/2021 ? ?# PCP prophylaxis ?Patient was on Septra DS on Mondays, Wednesdays, and Fridays, which was held due to  fluctuated kidney functions.  Currently off Septra. ? ?INTERVAL HISTORY ?Tina STUCKY is a 66 y.o. female who has above history reviewed by me today presents for follow up visit  ?12/03/2021, CT chest lung cancer screening showed new right middle lobe pulmonary nodule with an aggressive appearance characterized as lung RADS 4 VS.  Patient has PET scan scheduled on 12/22/2021.  Patient reports feeling concerned about the CT findings.  She has no constitutional ?No  shortness of breath, hemoptysis, chest pain. ? ?Past Medical History:  ?Diagnosis Date  ? Anemia   ? hemolytic anemia  ? Atherosclerosis of native arteries of extremity with intermittent claudication ([place]) 07/20/2016  ? [place]

## 2021-12-09 ENCOUNTER — Other Ambulatory Visit: Payer: Self-pay

## 2021-12-09 DIAGNOSIS — D696 Thrombocytopenia, unspecified: Secondary | ICD-10-CM

## 2021-12-09 LAB — COLD AGGLUTININ TITER: Cold Agglutinin Titer: NEGATIVE

## 2021-12-10 ENCOUNTER — Encounter (INDEPENDENT_AMBULATORY_CARE_PROVIDER_SITE_OTHER): Payer: Self-pay | Admitting: *Deleted

## 2021-12-10 ENCOUNTER — Encounter: Payer: Self-pay | Admitting: Hematology and Oncology

## 2021-12-11 ENCOUNTER — Inpatient Hospital Stay: Payer: BC Managed Care – PPO

## 2021-12-11 DIAGNOSIS — D5911 Warm autoimmune hemolytic anemia: Secondary | ICD-10-CM | POA: Diagnosis not present

## 2021-12-11 DIAGNOSIS — D649 Anemia, unspecified: Secondary | ICD-10-CM

## 2021-12-11 DIAGNOSIS — D696 Thrombocytopenia, unspecified: Secondary | ICD-10-CM

## 2021-12-11 LAB — COMPREHENSIVE METABOLIC PANEL
ALT: 12 U/L (ref 0–44)
AST: 17 U/L (ref 15–41)
Albumin: 3.9 g/dL (ref 3.5–5.0)
Alkaline Phosphatase: 69 U/L (ref 38–126)
Anion gap: 7 (ref 5–15)
BUN: 31 mg/dL — ABNORMAL HIGH (ref 8–23)
CO2: 27 mmol/L (ref 22–32)
Calcium: 8.8 mg/dL — ABNORMAL LOW (ref 8.9–10.3)
Chloride: 104 mmol/L (ref 98–111)
Creatinine, Ser: 1.26 mg/dL — ABNORMAL HIGH (ref 0.44–1.00)
GFR, Estimated: 48 mL/min — ABNORMAL LOW (ref >60)
Glucose, Bld: 114 mg/dL — ABNORMAL HIGH (ref 70–99)
Potassium: 3.4 mmol/L — ABNORMAL LOW (ref 3.5–5.1)
Sodium: 138 mmol/L (ref 135–145)
Total Bilirubin: 0.9 mg/dL (ref 0.3–1.2)
Total Protein: 7 g/dL (ref 6.5–8.1)

## 2021-12-11 LAB — CBC WITH DIFFERENTIAL/PLATELET
Abs Immature Granulocytes: 0.01 10*3/uL (ref 0.00–0.07)
Basophils Absolute: 0 10*3/uL (ref 0.0–0.1)
Basophils Relative: 1 %
Eosinophils Absolute: 0.1 10*3/uL (ref 0.0–0.5)
Eosinophils Relative: 3 %
HCT: 27.8 % — ABNORMAL LOW (ref 36.0–46.0)
Hemoglobin: 8.8 g/dL — ABNORMAL LOW (ref 12.0–15.0)
Immature Granulocytes: 0 %
Lymphocytes Relative: 20 %
Lymphs Abs: 0.8 10*3/uL (ref 0.7–4.0)
MCH: 30.3 pg (ref 26.0–34.0)
MCHC: 31.7 g/dL (ref 30.0–36.0)
MCV: 95.9 fL (ref 80.0–100.0)
Monocytes Absolute: 0.2 10*3/uL (ref 0.1–1.0)
Monocytes Relative: 5 %
Neutro Abs: 3 10*3/uL (ref 1.7–7.7)
Neutrophils Relative %: 71 %
Platelets: 116 10*3/uL — ABNORMAL LOW (ref 150–400)
RBC: 2.9 MIL/uL — ABNORMAL LOW (ref 3.87–5.11)
RDW: 16.3 % — ABNORMAL HIGH (ref 11.5–15.5)
WBC: 4.2 10*3/uL (ref 4.0–10.5)
nRBC: 0 % (ref 0.0–0.2)

## 2021-12-11 LAB — PATHOLOGIST SMEAR REVIEW

## 2021-12-11 LAB — IMMATURE PLATELET FRACTION: Immature Platelet Fraction: 8.6 % (ref 1.2–8.6)

## 2021-12-12 ENCOUNTER — Telehealth: Payer: Self-pay | Admitting: *Deleted

## 2021-12-12 ENCOUNTER — Other Ambulatory Visit: Payer: Self-pay | Admitting: Oncology

## 2021-12-12 LAB — KAPPA/LAMBDA LIGHT CHAINS
Kappa free light chain: 64.3 mg/L — ABNORMAL HIGH (ref 3.3–19.4)
Kappa, lambda light chain ratio: 2.19 — ABNORMAL HIGH (ref 0.26–1.65)
Lambda free light chains: 29.3 mg/L — ABNORMAL HIGH (ref 5.7–26.3)

## 2021-12-12 LAB — HAPTOGLOBIN: Haptoglobin: <10 mg/dL — ABNORMAL LOW (ref 37–355)

## 2021-12-12 LAB — C4 COMPLEMENT: Complement C4, Body Fluid: 26 mg/dL (ref 12–38)

## 2021-12-12 LAB — C3 COMPLEMENT: C3 Complement: 124 mg/dL (ref 82–167)

## 2021-12-12 MED ORDER — PREDNISONE 50 MG PO TABS
50.0000 mg | ORAL_TABLET | Freq: Every day | ORAL | 0 refills | Status: DC
Start: 2021-12-12 — End: 2021-12-22

## 2021-12-12 NOTE — Telephone Encounter (Signed)
I called the pt and let her know that dr Tasia Catchings feels like that pt has another episode of hemolysis. She is rec: that she take prednisone 50 mg daily. To come back 1 week for cbc/d again and the appt was made for 4/21 at 10 am and pt agreeable to this. She also knows to take prednisone with food and early morning. Pt says this is several times she has to do this so she knows what to do. I verified that the rx was sent it. ?

## 2021-12-13 LAB — COPPER, SERUM: Copper: 104 ug/dL (ref 80–158)

## 2021-12-15 LAB — COMP PANEL: LEUKEMIA/LYMPHOMA: PATH INTERP XXX-IMP: DETECTED

## 2021-12-15 LAB — MULTIPLE MYELOMA PANEL, SERUM
Albumin SerPl Elph-Mcnc: 3.9 g/dL (ref 2.9–4.4)
Albumin/Glob SerPl: 1.7 (ref 0.7–1.7)
Alpha 1: 0.3 g/dL (ref 0.0–0.4)
Alpha2 Glob SerPl Elph-Mcnc: 0.4 g/dL (ref 0.4–1.0)
B-Globulin SerPl Elph-Mcnc: 0.8 g/dL (ref 0.7–1.3)
Gamma Glob SerPl Elph-Mcnc: 1 g/dL (ref 0.4–1.8)
Globulin, Total: 2.4 g/dL (ref 2.2–3.9)
IgA: 485 mg/dL — ABNORMAL HIGH (ref 87–352)
IgG (Immunoglobin G), Serum: 676 mg/dL (ref 586–1602)
IgM (Immunoglobulin M), Srm: 94 mg/dL (ref 26–217)
M Protein SerPl Elph-Mcnc: 0.2 g/dL — ABNORMAL HIGH
Total Protein ELP: 6.3 g/dL (ref 6.0–8.5)

## 2021-12-15 NOTE — Telephone Encounter (Signed)
Spoke to patient and scheduled OV 01/06/2022 at 2:30. ?She voiced her understanding.  ?Nothing further needed.  ? ?

## 2021-12-16 DIAGNOSIS — D23112 Other benign neoplasm of skin of right lower eyelid, including canthus: Principal | ICD-10-CM

## 2021-12-16 DIAGNOSIS — H16211 Exposure keratoconjunctivitis, right eye: Principal | ICD-10-CM

## 2021-12-16 DIAGNOSIS — H02132 Senile ectropion of right lower eyelid: Principal | ICD-10-CM

## 2021-12-17 ENCOUNTER — Other Ambulatory Visit: Payer: Self-pay | Admitting: Oncology

## 2021-12-17 DIAGNOSIS — D649 Anemia, unspecified: Secondary | ICD-10-CM

## 2021-12-18 ENCOUNTER — Telehealth: Payer: Self-pay

## 2021-12-18 DIAGNOSIS — D5911 Warm autoimmune hemolytic anemia: Secondary | ICD-10-CM | POA: Diagnosis not present

## 2021-12-18 NOTE — Telephone Encounter (Signed)
Spoke with patient to remind her of 24 hour urine. Patient confirmed she has already started.  ?

## 2021-12-18 NOTE — Telephone Encounter (Signed)
-----   Message from Earlie Server, MD sent at 12/17/2021 10:19 PM EDT ----- ?She is coming back for cbc on 4/21. Please remind her to submit 24 hour UPEP. Ordered at last visit, not collected yet. Thanks.  ?

## 2021-12-19 ENCOUNTER — Inpatient Hospital Stay: Payer: BC Managed Care – PPO

## 2021-12-19 DIAGNOSIS — D696 Thrombocytopenia, unspecified: Secondary | ICD-10-CM

## 2021-12-19 DIAGNOSIS — D5911 Warm autoimmune hemolytic anemia: Secondary | ICD-10-CM | POA: Diagnosis not present

## 2021-12-19 DIAGNOSIS — D649 Anemia, unspecified: Secondary | ICD-10-CM

## 2021-12-19 LAB — CBC WITH DIFFERENTIAL/PLATELET
Abs Immature Granulocytes: 0.14 10*3/uL — ABNORMAL HIGH (ref 0.00–0.07)
Basophils Absolute: 0 10*3/uL (ref 0.0–0.1)
Basophils Relative: 0 %
Eosinophils Absolute: 0.1 10*3/uL (ref 0.0–0.5)
Eosinophils Relative: 1 %
HCT: 31.7 % — ABNORMAL LOW (ref 36.0–46.0)
Hemoglobin: 9.9 g/dL — ABNORMAL LOW (ref 12.0–15.0)
Immature Granulocytes: 1 %
Lymphocytes Relative: 15 %
Lymphs Abs: 1.5 10*3/uL (ref 0.7–4.0)
MCH: 31.1 pg (ref 26.0–34.0)
MCHC: 31.2 g/dL (ref 30.0–36.0)
MCV: 99.7 fL (ref 80.0–100.0)
Monocytes Absolute: 0.9 10*3/uL (ref 0.1–1.0)
Monocytes Relative: 9 %
Neutro Abs: 7.1 10*3/uL (ref 1.7–7.7)
Neutrophils Relative %: 74 %
Platelets: 155 10*3/uL (ref 150–400)
RBC: 3.18 MIL/uL — ABNORMAL LOW (ref 3.87–5.11)
RDW: 16.8 % — ABNORMAL HIGH (ref 11.5–15.5)
WBC: 9.7 10*3/uL (ref 4.0–10.5)
nRBC: 0.2 % (ref 0.0–0.2)

## 2021-12-22 ENCOUNTER — Other Ambulatory Visit: Payer: Self-pay | Admitting: Oncology

## 2021-12-22 ENCOUNTER — Ambulatory Visit (HOSPITAL_COMMUNITY)
Admission: RE | Admit: 2021-12-22 | Discharge: 2021-12-22 | Disposition: A | Discharge status: Home / Self Care | Payer: BC Managed Care – PPO | Source: Ambulatory Visit | Attending: Acute Care | Admitting: Acute Care

## 2021-12-22 ENCOUNTER — Telehealth: Payer: Self-pay

## 2021-12-22 DIAGNOSIS — D696 Thrombocytopenia, unspecified: Secondary | ICD-10-CM

## 2021-12-22 DIAGNOSIS — D649 Anemia, unspecified: Secondary | ICD-10-CM

## 2021-12-22 DIAGNOSIS — R911 Solitary pulmonary nodule: Secondary | ICD-10-CM | POA: Insufficient documentation

## 2021-12-22 LAB — IFE+PROTEIN ELECTRO, 24-HR UR
% BETA, Urine: 19.5 %
ALPHA 1 URINE: 12.1 %
Albumin, U: 19.2 %
Alpha 2, Urine: 24.3 %
GAMMA GLOBULIN URINE: 24.9 %
Total Protein, Urine-Ur/day: 328 mg/24 hr — ABNORMAL HIGH (ref 30–150)
Total Protein, Urine: 25.2 mg/dL
Total Volume: 1300

## 2021-12-22 LAB — GLUCOSE, CAPILLARY: Glucose-Capillary: 97 mg/dL (ref 70–99)

## 2021-12-22 MED ORDER — FLUDEOXYGLUCOSE F - 18 (FDG) INJECTION
8.5400 | Freq: Once | INTRAVENOUS | Status: AC
  Administered 2021-12-22: 8.54 via INTRAVENOUS

## 2021-12-22 MED ORDER — PREDNISONE 50 MG PO TABS: 50.0000 mg | ORAL_TABLET | Freq: Every day | ORAL | 0 refills | Status: DC

## 2021-12-22 NOTE — Telephone Encounter (Signed)
-----   Message from Earlie Server, MD sent at 12/22/2021  3:38 PM EDT ----- ?Hb is better. Please advise patient to continue prednisone '50mg'$  for another week, see me lab -cbc cmp in 1 week. Prednisone Rx refilled.  ? ?

## 2021-12-22 NOTE — Telephone Encounter (Signed)
Spoke with patient and informed her of lab results and Dr. Collie Siad recommendation to continue Prednisone. Advised that Dr. Tasia Catchings would like to see her next week to follow up. Patient verbalized understanding. ? ? ?Please schedule patient for: ?Labs/MD next week.  ?Patient will view appt in [place]. Thanks  ?

## 2021-12-23 ENCOUNTER — Encounter: Payer: Self-pay | Admitting: Hematology and Oncology

## 2021-12-25 ENCOUNTER — Telehealth: Payer: Self-pay | Admitting: Acute Care

## 2021-12-25 NOTE — Telephone Encounter (Signed)
I have attempted to call the patient with the results of their  PET scan. There was no answer. I have left a VM requesting the patient call the office for the scan results. I included the office contact information in the message. We will await their return call. If no return call we will continue to call until patient is contacted.  ? ?Langley Gauss, PET was not +. It showed   No abnormal FDG avidity associated with the resolving right middle lobe pulmonary nodule now with a 12 mm ground-glass opacity ?in the area of prior solid nodularity, most consistent with an infectious or inflammatory etiology. Recommend continued radiographic follow-up to resolution. ? ?She needs to keep her appointment with Dr. Darnell Level on 01/06/2022, as she will need follow up Imaging. ? ?Please fax results to PCP and let them know plan is for follow up with Dr. Darnell Level on 5/9 and follow up imaging to watch for resolution. She should return to the lung cancer screening population.  ?Dr. Darnell Level, if you will just let us know interval for imaging, 3 or 6 months. We can do a LDCT follow up or CT Chest without , whichever you prefer. Thanks so much ?

## 2021-12-25 NOTE — Telephone Encounter (Signed)
Returned call to patient to review results of PET scan. Per Magnus Sinning review, the area of concern did not show signs concerning for cancer.  Appearance is likely infectious and has improved but has not completely cleared.  Advised to keep appointment with Dr. Patsey Berthold so she can be examined and will determine a plan for follow up imaging.  Patient acknowledged understanding and had no further questions, stating she has had pneumonia in the past.  Patient is in agreement to keep appointment and be advised on next steps by Dr. Darnell Level.  PCP faxed results and plan per recommendations.   ?

## 2021-12-27 ENCOUNTER — Other Ambulatory Visit: Payer: Self-pay

## 2021-12-27 ENCOUNTER — Encounter: Payer: Self-pay | Admitting: Emergency Medicine

## 2021-12-27 ENCOUNTER — Emergency Department
Admission: EM | Admit: 2021-12-27 | Discharge: 2021-12-27 | Disposition: A | Discharge status: Home / Self Care | Payer: BC Managed Care – PPO | Attending: Emergency Medicine | Admitting: Emergency Medicine

## 2021-12-27 DIAGNOSIS — I129 Hypertensive chronic kidney disease with stage 1 through stage 4 chronic kidney disease, or unspecified chronic kidney disease: Secondary | ICD-10-CM | POA: Diagnosis not present

## 2021-12-27 DIAGNOSIS — L02414 Cutaneous abscess of left upper limb: Secondary | ICD-10-CM | POA: Insufficient documentation

## 2021-12-27 DIAGNOSIS — L03114 Cellulitis of left upper limb: Secondary | ICD-10-CM | POA: Diagnosis not present

## 2021-12-27 DIAGNOSIS — I2510 Atherosclerotic heart disease of native coronary artery without angina pectoris: Secondary | ICD-10-CM | POA: Insufficient documentation

## 2021-12-27 DIAGNOSIS — D649 Anemia, unspecified: Secondary | ICD-10-CM | POA: Diagnosis not present

## 2021-12-27 DIAGNOSIS — D696 Thrombocytopenia, unspecified: Secondary | ICD-10-CM | POA: Insufficient documentation

## 2021-12-27 DIAGNOSIS — N189 Chronic kidney disease, unspecified: Secondary | ICD-10-CM | POA: Insufficient documentation

## 2021-12-27 DIAGNOSIS — L02419 Cutaneous abscess of limb, unspecified: Secondary | ICD-10-CM

## 2021-12-27 DIAGNOSIS — R7989 Other specified abnormal findings of blood chemistry: Secondary | ICD-10-CM | POA: Diagnosis not present

## 2021-12-27 DIAGNOSIS — J449 Chronic obstructive pulmonary disease, unspecified: Secondary | ICD-10-CM | POA: Diagnosis not present

## 2021-12-27 LAB — BASIC METABOLIC PANEL
Anion gap: 5 (ref 5–15)
BUN: 34 mg/dL — ABNORMAL HIGH (ref 8–23)
CO2: 30 mmol/L (ref 22–32)
Calcium: 8.9 mg/dL (ref 8.9–10.3)
Chloride: 103 mmol/L (ref 98–111)
Creatinine, Ser: 1.18 mg/dL — ABNORMAL HIGH (ref 0.44–1.00)
GFR, Estimated: 52 mL/min — ABNORMAL LOW (ref >60)
Glucose, Bld: 109 mg/dL — ABNORMAL HIGH (ref 70–99)
Potassium: 3.7 mmol/L (ref 3.5–5.1)
Sodium: 138 mmol/L (ref 135–145)

## 2021-12-27 LAB — CBC
HCT: 32.2 % — ABNORMAL LOW (ref 36.0–46.0)
Hemoglobin: 10 g/dL — ABNORMAL LOW (ref 12.0–15.0)
MCH: 31.4 pg (ref 26.0–34.0)
MCHC: 31.1 g/dL (ref 30.0–36.0)
MCV: 101.3 fL — ABNORMAL HIGH (ref 80.0–100.0)
Platelets: 142 10*3/uL — ABNORMAL LOW (ref 150–400)
RBC: 3.18 MIL/uL — ABNORMAL LOW (ref 3.87–5.11)
RDW: 17 % — ABNORMAL HIGH (ref 11.5–15.5)
WBC: 14.1 10*3/uL — ABNORMAL HIGH (ref 4.0–10.5)
nRBC: 0 % (ref 0.0–0.2)

## 2021-12-27 LAB — LACTIC ACID, PLASMA: Lactic Acid, Venous: 1.1 mmol/L (ref 0.5–1.9)

## 2021-12-27 MED ORDER — CEPHALEXIN 500 MG PO CAPS: 500.0000 mg | ORAL_CAPSULE | Freq: Three times a day (TID) | ORAL | 0 refills | Status: DC

## 2021-12-27 MED ORDER — SULFAMETHOXAZOLE-TRIMETHOPRIM 800-160 MG PO TABS
1.0000 | ORAL_TABLET | Freq: Once | ORAL | Status: AC
  Administered 2021-12-27: 1 via ORAL
  Filled 2021-12-27: qty 1

## 2021-12-27 NOTE — Discharge Instructions (Signed)
You were seen today for an abscess of your left forearm.  I am putting you on antibiotics 3 times daily for the next 10 days.  Please change the dressing daily.  Wash with warm water and soap, pat dry and cover with a Telfa and gauze wrap.  Monitor for increased redness, streaking, swelling, fever, chills, nausea or vomiting.  Please follow-up with your PCP on Monday for reevaluation. ?

## 2021-12-27 NOTE — ED Provider Notes (Addendum)
? ?[facility] ?Provider Note ? ? ? Event Date/Time  ? First MD Initiated Contact with Patient 12/27/21 1100   ?  (approximate) ? ? ?History  ? ?Abscess ? ? ?HPI ? ?Tina Page is a 66 y.o. female with a history of HTN, HLD with CAD/PAD/aortic atherosclerosis, COPD, autoimmune hemolytic anemia, lupus and chronic hep B presents to the ER today with complaint of a presumed spider bite to her left forearm.  She noticed this 1 week ago.  She reports that she did see an actual spider but was not able to kill it or capture it.  She reports increased pain, redness, swelling and warmth to the left forearm.  She reports the area opened this morning and started to drain.  She denies fever, chills, nausea or vomiting.  She has tried Neosporin OTC with minimal relief of symptoms. ? ?  ? ? ?Physical Exam  ? ?Triage Vital Signs: ?ED Triage Vitals  ?Enc Vitals Group  ?   BP 12/27/21 1027 (!) 116/47  ?   Pulse Rate 12/27/21 1027 66  ?   Resp 12/27/21 1027 18  ?   Temp 12/27/21 1027 98.4 ?F (36.9 ?C)  ?   Temp Source 12/27/21 1027 Oral  ?   SpO2 12/27/21 1027 98 %  ?   Weight 12/27/21 1019 169 lb 12.1 oz (77 kg)  ?   Height 12/27/21 1019 '5\' 3"'$  (1.6 m)  ?   Head Circumference --   ?   Peak Flow --   ?   Pain Score 12/27/21 1019 6  ?   Pain Loc --   ?   Pain Edu? --   ?   Excl. in [place]? --   ? ? ?Most recent vital signs: ?Vitals:  ? 12/27/21 1135 12/27/21 1136  ?BP: (!) 128/59 (!) 128/59  ?Pulse: (!) 58 68  ?Resp: 16 18  ?Temp: 97.6 ?F (36.4 ?C)   ?SpO2: 98% 98%  ? ? ? ?General: Awake, no distress.  ?CV:  RRR. ?Resp:  Normal effort.  ?Skin:  2 cm round, raised abscess noted of the medial left forearm with 4 cm of surrounding redness.  No streaking. ? ? ? ? ?ED Results / Procedures / Treatments  ? ?Labs ? ?Labs Reviewed  ?CBC - Abnormal; Notable for the following components:  ?    Result Value  ? WBC 14.1 (*)   ? RBC 3.18 (*)   ? Hemoglobin 10.0 (*)   ? HCT 32.2 (*)   ? MCV 101.3 (*)   ? RDW 17.0 (*)   ?  Platelets 142 (*)   ? All other components within normal limits  ?BASIC METABOLIC PANEL - Abnormal; Notable for the following components:  ? Glucose, Bld 109 (*)   ? BUN 34 (*)   ? Creatinine, Ser 1.18 (*)   ? GFR, Estimated 52 (*)   ? All other components within normal limits  ?LACTIC ACID, PLASMA  ?LACTIC ACID, PLASMA  ? ? ? ? ?MEDICATIONS ORDERED IN ED: ?Medications  ?sulfamethoxazole-trimethoprim (BACTRIM DS) 800-160 MG per tablet 1 tablet (1 tablet Oral Given 12/27/21 1118)  ? ? ? ?IMPRESSION / MDM / ASSESSMENT AND PLAN / ED COURSE  ?I reviewed the triage vital signs and the nursing notes. ? ?Insect Bite of Left Forearm ? ?Differential diagnosis includes, but is not limited to, abscess, abscess with cellulitis, infected insect bite of left forearm. ? ? ?CBC shows elevated white count consistent with infection,  slightly improved anemia and thrombocytopenia ?BMET shows stable chronic kidney disease, normal electrolytes ?Lactic acid normal ?Septra DS 1 tab PO x 1 ?Wound is open and draining. I cleansed the wound with Betadine and Saline. Able to express a small amount of pus and blood. ?She declines I&D at this time, would prefer to see if it improves with antibiotics. ?RX for Keflex 500 mg 3 x day for 10 days ?Cleanse the area daily with warm water and soap, keep covered with Telfa and gauze wrap ?Monitor for increased redness, pain, swelling, fever, chills, nausea or vomiting ?She will follow up with her PCP Monday for reevaluation ? ?FINAL CLINICAL IMPRESSION(S) / ED DIAGNOSES  ? ?Final diagnoses:  ?Cellulitis and abscess of upper extremity  ? ? ? ?Rx / DC Orders  ? ?ED Discharge Orders   ? ?      Ordered  ?  cephALEXin (KEFLEX) 500 MG capsule  3 times daily       ? 12/27/21 1203  ? ?  ?  ? ?  ? ? ? ?Note:  This document was prepared using Dragon voice recognition software and may include unintentional dictation errors. ? ?  ?Jearld Fenton, NP ?12/27/21 1204 ? ?  ?Jearld Fenton, NP ?12/27/21 1204 ? ?   ?Naaman Plummer, MD ?12/27/21 1258 ? ?

## 2021-12-27 NOTE — ED Notes (Addendum)
Pt has wound to L fa; pt has been using bactrim and reports is on prednisone; site has been draining a little per pt; no obvious drainage noted at site currently; pt reports at one point "it had a yellow head on it"; site center currently dark red/purple; redness an heat around site; pt denies fever. Center about dime-sized; redness around site about golf-ball sized; no major swelling noted. Pt reports saw and knocked a spider off her arm after it bit her. Pt in NAD.  ?

## 2021-12-27 NOTE — ED Notes (Signed)
Wound on Left FA dressed with non-adherent and wrapped with [place] ?

## 2021-12-27 NOTE — ED Triage Notes (Signed)
Pt reports spider bite to her left forearm since Monday. Pt reports busted open and started draining today but she is concerned for infection.  ?

## 2021-12-31 ENCOUNTER — Telehealth: Payer: Self-pay | Admitting: Acute Care

## 2021-12-31 NOTE — Telephone Encounter (Signed)
I have attempted to call the patient with the results of their  PET scan. There was no answer. I have left a VM requesting the patient call the office for the scan results. I included the office contact information in the message. We will await patient's  return call. If no return call we will continue to call until patient is contacted. She does have an appointment to see Dr. Darnell Level. To review the PET results 01/06/2022 . ?

## 2022-01-01 NOTE — Telephone Encounter (Signed)
I have called the patient with the results of her PET scan. I explained that her scan showed No abnormal FDG avidity associated with the resolving right middle lobe pulmonary nodule now with a 12 mm ground-glass opacity ?in the area of prior solid nodularity, most consistent with an infectious or inflammatory etiology. Recommend continued radiographic follow-up to resolution.I told her this was reassuring.  ?  ?2. Hypermetabolic activity in the intercostal muscles and along the ?right greater than left crura of the diaphragm commonly reflecting ?physiologic activity related to ventilation. ? ?She has an appointment with Dr. Patsey Berthold 01/06/2022 to review the scan in more detail and to determine appropriate  follow up . ?She verbalized understanding and had no further questions at completion of the call.  ?

## 2022-01-01 NOTE — Telephone Encounter (Signed)
I have attempted to call the patient with the results of their   PET scan.  There was no answer. I have left a VM requesting the patient call the office for the scan results. I included the office contact information in the message. We will await his return call. If no return call we will continue to call until patient is contacted.   ? ?Pt. Has an OV with Dr. Patsey Berthold on 5/9, so she will get her results at that time, as well as recommendations for follow up scanning.  ? ?Dr. Darnell Level, just let us know plans for follow up scanning/ return to lung cancer screening population. I tried to call her with her results as they were negative, and I wanted to let her know in case she had been worrying. We just cannot connect with her. ( She works and I think we just keep missing each other).   ? ?Thanks so much ?

## 2022-01-02 ENCOUNTER — Inpatient Hospital Stay: Payer: BC Managed Care – PPO | Attending: Oncology

## 2022-01-02 ENCOUNTER — Encounter: Payer: Self-pay | Admitting: Oncology

## 2022-01-02 ENCOUNTER — Inpatient Hospital Stay: Payer: BC Managed Care – PPO | Admitting: Oncology

## 2022-01-02 ENCOUNTER — Other Ambulatory Visit: Payer: Self-pay | Admitting: Oncology

## 2022-01-02 VITALS — BP 109/51 | HR 61 | Temp 96.8°F | Resp 16 | Ht 63.0 in | Wt 175.0 lb

## 2022-01-02 DIAGNOSIS — D649 Anemia, unspecified: Secondary | ICD-10-CM

## 2022-01-02 DIAGNOSIS — R7989 Other specified abnormal findings of blood chemistry: Secondary | ICD-10-CM | POA: Insufficient documentation

## 2022-01-02 DIAGNOSIS — D696 Thrombocytopenia, unspecified: Secondary | ICD-10-CM | POA: Insufficient documentation

## 2022-01-02 DIAGNOSIS — D5911 Warm autoimmune hemolytic anemia: Secondary | ICD-10-CM | POA: Insufficient documentation

## 2022-01-02 DIAGNOSIS — R768 Other specified abnormal immunological findings in serum: Secondary | ICD-10-CM

## 2022-01-02 DIAGNOSIS — E538 Deficiency of other specified B group vitamins: Secondary | ICD-10-CM | POA: Diagnosis not present

## 2022-01-02 DIAGNOSIS — B1920 Unspecified viral hepatitis C without hepatic coma: Secondary | ICD-10-CM | POA: Diagnosis not present

## 2022-01-02 DIAGNOSIS — R918 Other nonspecific abnormal finding of lung field: Secondary | ICD-10-CM | POA: Insufficient documentation

## 2022-01-02 DIAGNOSIS — D591 Autoimmune hemolytic anemia, unspecified: Secondary | ICD-10-CM | POA: Diagnosis not present

## 2022-01-02 DIAGNOSIS — Z87891 Personal history of nicotine dependence: Secondary | ICD-10-CM | POA: Insufficient documentation

## 2022-01-02 LAB — CBC WITH DIFFERENTIAL/PLATELET
Abs Immature Granulocytes: 0.05 10*3/uL (ref 0.00–0.07)
Basophils Absolute: 0 10*3/uL (ref 0.0–0.1)
Basophils Relative: 0 %
Eosinophils Absolute: 0.1 10*3/uL (ref 0.0–0.5)
Eosinophils Relative: 1 %
HCT: 32.2 % — ABNORMAL LOW (ref 36.0–46.0)
Hemoglobin: 9.9 g/dL — ABNORMAL LOW (ref 12.0–15.0)
Immature Granulocytes: 1 %
Lymphocytes Relative: 10 %
Lymphs Abs: 0.8 10*3/uL (ref 0.7–4.0)
MCH: 31.1 pg (ref 26.0–34.0)
MCHC: 30.7 g/dL (ref 30.0–36.0)
MCV: 101.3 fL — ABNORMAL HIGH (ref 80.0–100.0)
Monocytes Absolute: 0.4 10*3/uL (ref 0.1–1.0)
Monocytes Relative: 5 %
Neutro Abs: 7 10*3/uL (ref 1.7–7.7)
Neutrophils Relative %: 83 %
Platelets: 126 10*3/uL — ABNORMAL LOW (ref 150–400)
RBC: 3.18 MIL/uL — ABNORMAL LOW (ref 3.87–5.11)
RDW: 17.1 % — ABNORMAL HIGH (ref 11.5–15.5)
WBC: 8.3 10*3/uL (ref 4.0–10.5)
nRBC: 0 % (ref 0.0–0.2)

## 2022-01-02 LAB — COMPREHENSIVE METABOLIC PANEL
ALT: 23 U/L (ref 0–44)
AST: 17 U/L (ref 15–41)
Albumin: 3.8 g/dL (ref 3.5–5.0)
Alkaline Phosphatase: 54 U/L (ref 38–126)
Anion gap: 6 (ref 5–15)
BUN: 46 mg/dL — ABNORMAL HIGH (ref 8–23)
CO2: 25 mmol/L (ref 22–32)
Calcium: 8.8 mg/dL — ABNORMAL LOW (ref 8.9–10.3)
Chloride: 104 mmol/L (ref 98–111)
Creatinine, Ser: 1.6 mg/dL — ABNORMAL HIGH (ref 0.44–1.00)
GFR, Estimated: 36 mL/min — ABNORMAL LOW (ref >60)
Glucose, Bld: 73 mg/dL (ref 70–99)
Potassium: 4.7 mmol/L (ref 3.5–5.1)
Sodium: 135 mmol/L (ref 135–145)
Total Bilirubin: 1 mg/dL (ref 0.3–1.2)
Total Protein: 6.5 g/dL (ref 6.5–8.1)

## 2022-01-02 MED ORDER — PREDNISONE 10 MG PO TABS: 10.0000 mg | ORAL_TABLET | ORAL | 0 refills | Status: DC

## 2022-01-02 NOTE — Progress Notes (Signed)
?Hematology/Oncology Progress note ?[phone] [phone] ?  ? ? ? ?Clinic Day:  01/02/2022 ? ?Referring physician: Frazier Richards, MD ? ?Chief Complaint: Tina Page is a 66 y.o. 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 ?PERTINENT HEMATOLOGY HISTORY ?Patient previously followed up by Dr.Corcoran, patient switched care to me on 02/15/21 ?Extensive medical record review was performed by me ? ? ?Autoimmune hemolytic anemia diagnosis in 2017 ?her work-up revealed a cold autoantibody (IgG and complement).  Reticulocyte count was 11.3%.  Ferritin was 562.  Iron studies revealed a saturation of 20% and a TIBC of 214 (low).  B12 was 231 (low normal).  Folate was 42.   Peripheral smear revealed rouleaux formation.    ?Additional testing included the following + studies:  hepatitis C antibody, hepatitis B core antibody, CMV IgM, and EBV VCA (IgM and IgG).  Hepatitis B by PCR was negative.  Hepatitis C RNA was negative.  Mycoplasma pneumonia IgM was negative.  Reticulocyte count was 11.3% (high) indicating appropriate marrow response.  C3 and C4 were normal.  Cold agglutinin titer was negative x 2.  Negative studies included:  ANA, hepatitis B surface antigen, SPEP, and free light chain ratio.   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  MMA was initially 330 (normal).  Repeat MMA was elevated on 08/01/2016 confirming B12 deficiency.  Hepatitis B surface antibody was positive and hepatitis B surface antigen was negative on 08/06/2016. ?  ?07/07/2016  Chest, abdomen, and pelvis CT angiogram on revealed moderate diffuse atherosclerotic vascular disease of the abdominal aorta with severe stenosis of the left common iliac artery with suspected short segment occlusion. There was no adenopathy.  Spleen was normal.   ?04/15/2017 Abd US revealed a normal spleen and sludge in the gallbladder.  The liver was echogenic  consistent fatty infiltration and/or hepatocellular disease. ? ?For hemolytic anemia treatments, in 2017, She has received multiple units of  warmed PRBCs to date.  ?08/01/2016-03/12/2017, steroid treatment with prednisone.  She is also on folic acid 1 mg daily. ?Borderline low vitamin B12 level on 07/09/2016.  Patient has been on oral vitamin B12 supplementation. ? ?10/12/2017  positive warm autoantibody.  LDH and bilirubin were normal.   She began prednisone 10 mg on 12/07/2017 (discontinued 03/2018).  She received Rituxan weekly x 4 (last dose 06/16/2018).  Entecavir was discontinued on 07/06/2019. ? ?06/12/2020 developed recurrent anemia.  Hematocrit was 27.6, hemoglobin 8.4, MCV 104.5, platelets 138,000, WBC 3,400 (ANC 2,300). Creatinine was 1.17 (CrCl 50 ml/min).  Positive warm autoantibody  Reticulocyte count was 7.1%.  LDH was 204 (98-192).  06/16/2020, began prednisone 1 mg/kilogram treatments, 07/24/2020, prednisone was tapered down to 20 mg. ? She received Rituxan 1000 mg on day 1 and 15 (07/15/2020 and 07/29/2020).  She began entecavir on 06/28/2020.  Prednisone was tapered off.  She was on Septra for PCP prophylaxis which is now currently on hold  ? ? ?Other medical problems ?07/10/2016.  She underwent PTCA and stent placement in right and left common iliac arteries and left external iliac artery. ?She is on Plavix.   ?03/19/2015 EGD revealed gastritis in the body and antrum.  Colonoscopy revealed one hyperplastic polyp.  Repeat EGD on 07/08/2016 was normal.  No evidence of bleeding. ? ?History of she developed peri-oral and intranasal herpes simplex-1.  She was treated with valacyclovir and doxycycline on 10/19/2016.   ?10/26/2017. She developed flu like symptoms.  Symptoms included cough,  myalgias, and fever (tmax unknown).  She was prescribed Mucinex, Tamiflu, and amoxicillin.  She only took amoxicillin x 5 days.  She developed increased liver function tests on 11/02/2017. CMV IgG was positive.  EBV VCA  IgG, NA IgG, early antigen antibody IgG were elevated.  EBV VCA IgM was < 36.  Testing was c/w a convalescence/past infection or reactivated infection.  LFTs normalized on 11/15/2017.  Smooth muscle antibody was 39 (high) on 11/10/2017 and 34 (high) on 11/30/2017.  ?  ? ?Chest CT on 11/16/2017 revealed a 2.2 x 2.0 x 2.4 spiculated RIGHT middle lobe mass.  There was tiny nonspecific upper lobe pulmonary nodules greater on RIGHT, largest 3 mm, of uncertain etiology.  There was additional 8 x 8 mm opacity in the posterior sulcus of the RIGHT lower lobe. PET scan on 11/22/2017 revealed a 2 cm hypermetabolic right middle lobe lung mass (SUV 3.4) consistent  with primary lung neoplasm.  There were no findings for mediastinal/hilar lymphadenopathy or metastatic disease.  There were areas of hypermetabolism involving the left oblique abdominal muscles and the anorectal junction. ?PFTs on 11/18/2017 revealed an FEV1 of 1.22 liters (51%).  DLCO adj was 6.8 mL/mmHg/min (32%). ?  ?ENB done on 12/07/2017. Cytology was negative for malignancy. Pathology demonstrated organizing pneumonia and chronic bronchitis. Pathologist commented that organizing pneumonia spanned about 2 mm in one fragment. Cases of focal organizing pneumonia with hypermetabolism on FDG-PET have been described, but changes of this type can also be adjacent to a neoplasm. Patient prescribed a daily dose of Prednisone 10 mg  ? ?07/24/2020  repeat  chest CT on  revealed Lung-RADS 2, benign appearance or behavior. There was emphysema, aortic atherosclerosis, and coronary artery calcifications. ? ?diarrhea.  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  She received azithromycin x 3 days.  She received ciprofloxacin x 10 days without improvement.  She has seen ID in [place].  She began Bactrim on 03/04/2018 (discontinued on 03/11/2018) secondary to increase in creatine. ?  ?Chronic kidney disease Urinalysis on 11/15/2017 revealed  revealed no hemoglobin, bilirubin or active sediment. ?  ?Liver function tests increased on 02/15/2019.  Work-up on 02/16/2019 revealed hepatitis B E antibody positive.  Negative studies included: hepatitis A total antibody, hepatitis A IgM, hepatitis B surface antigen, hepatitis B E antigen.  Hepatitis B DNA was not detected.  Folate was 80.5 and vitamin B12 was 443. ? ?She has received her vaccinations:  She has completed the PCV13, PPSV23, and HiB.  She received Menvio (quadrivalent meningoccal vaccine) and Bexero (univalent serogroup B meningoccal vaccine) on 07/06/2019. ? ?She was diagnosed with C difficile + diarrhea on 08/12/2020.  She completed a course of oral vancomycin.  Stool was negative for C. difficile on 08/27/2020. ? ?11/17/2020 - 11/20/2020 [facility] admission with left L5 varicella zoster.  Lesion was positive for VZV. She was treated with ceftazidime and a valacyclovir.  She was discharged on 9 days of Valtrex 1 gm 3 times daily followed by 500 mg daily indefinitely for suppression and doxycycline. ? ?History of discoid lupus ? ?#Positive hepatitis B core antibody ?Previously on entecavir 0.5 mg daily, currently she has finished a course. ?Dose adjusted based on renal function. ?CrCl 30-49 ml/min   50% dosing (0.5 mg QOD) ?CrCl >= 50 ml/min 100% dosing (0.5 mg a day). ?With her kidney function currently QOD ?She has completed course of Entecavir and stopped 08/14/2021 ? ?# PCP prophylaxis ?Patient was on Septra DS on Mondays, Wednesdays, and  Fridays, which was held due to fluctuated kidney functions.  Currently off Septra. ? ?12/03/2021, CT chest lung cancer screening showed new right middle lobe pulmonary nodule with an aggressive appearance characterized as lung RADS 4 VS. ? ?12/22/2021 PET showed No abnormal FDG avidity associated with the resolving right middle lobe pulmonary nodule now with a 12 mm ground-glass opacity in the area of prior solid nodularity, most consistent with an infectious or  inflammatory etiology ? ?INTERVAL HISTORY ?SHAMIYA DEMERITT is a 66 y.o. female who has above history reviewed by me today presents for follow up visit  ?She is currently on prednisone '50mg'$  daily. Tolerates we

## 2022-01-03 LAB — HAPTOGLOBIN: Haptoglobin: 26 mg/dL — ABNORMAL LOW (ref 37–355)

## 2022-01-06 ENCOUNTER — Ambulatory Visit: Payer: BC Managed Care – PPO | Admitting: Pulmonary Disease

## 2022-01-06 ENCOUNTER — Encounter: Payer: Self-pay | Admitting: Pulmonary Disease

## 2022-01-06 ENCOUNTER — Other Ambulatory Visit: Payer: Self-pay | Admitting: *Deleted

## 2022-01-06 VITALS — BP 142/60 | HR 94 | Temp 97.8°F | Ht 63.0 in | Wt 176.6 lb

## 2022-01-06 DIAGNOSIS — Z87891 Personal history of nicotine dependence: Secondary | ICD-10-CM

## 2022-01-06 DIAGNOSIS — R911 Solitary pulmonary nodule: Secondary | ICD-10-CM

## 2022-01-06 DIAGNOSIS — R918 Other nonspecific abnormal finding of lung field: Secondary | ICD-10-CM

## 2022-01-06 DIAGNOSIS — J449 Chronic obstructive pulmonary disease, unspecified: Secondary | ICD-10-CM

## 2022-01-06 NOTE — Progress Notes (Signed)
Subjective:    Patient ID: Tina Page, female    DOB: September 02, 1955, 66 y.o.   MRN: [id number] Patient Care Team: Abram Sander, MD as PCP - General (Family Medicine) Mick Sell, MD (Infectious Diseases) Glory Buff, RN as Registered Nurse Midge Minium, MD as Consulting Physician (Gastroenterology) Lynn Ito, MD as Consulting Physician (Infectious Diseases) Antonieta Iba, MD as Consulting Physician (Cardiology)  Chief Complaint  Patient presents with   pulmonary consult    Review PET- Wheezing and SOB with exertion.     HPI Tina Page is a 66 year old female, former smoker quit in 2017 (63-pack-year history). 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 Previously followed by Dr. Belia Heman (last seen 2019), first visit with me today for evaluation of a lung nodule noted on lung cancer screening CT and to discuss recent PET/CT.     As noted this is the first visit with me today.  She has had prior PFTs performed in 2019, no PFTs since then.  She has a nonproductive cough and some shortness of breath on exertion. She uses albuterol at least once a day due to shortness of breath and wheezing, this is effective.  Previously she has had significant bronchodilator response noted on  PFTs.  She has not had any fevers, chills or sweats recently.  Cough has no sputum production, there is no hemoptysis.  She does not endorse any other respiratory symptomatology today.  Her dyspnea is pretty much at baseline.   She has had some symptoms of GERD after discontinuing omeprazole due to potential interaction with Plavix.  She is on Plavix for iliac stents placed in the past.   She has had evanescent pulmonary nodules that have been followed closely by the lung  cancer screening program.  Also prior electromagnetic navigation bronchoscopy in April 2019 by Dr. Belia Heman with biopsy of the right middle lobe nodule that showed organizing pneumonia and chronic bronchitis.  She does have a history of lupus and hemolytic anemia.   Her most recent CT chest was performed 02 December 2021 and was read as a lung RADS 4 BS due to a new right middle lobe nodule with "aggressive" appearance measuring 14.4 mm.  This was followed by a PET/CT on 22 December 2021 that showed no hypermetabolic thoracic lymph nodes nor pulmonary nodules.  On PET/CT the pulmonary nodule is now 12 mm groundglass opacity and will need ongoing follow-up.  DATA PFTS: 11/18/2017 PFTs: FEV1 1.22 L or 51% predicted, FVC 1.88 L or 63% predicted, FEV1/FVC 65%, no bronchodilator response.  Lung volumes normal, diffusion capacity moderate to severely impaired.  IMAGING: 02/16/18 CT chest wo contrast- showed almost complete resolution of spiculated nodules RML. Stable 5mm right parahilar nodules since March 2019 but not present in 2017.  06/27/19 CT chest wo contrast-  previously described nodule of the right middle lobe has almost completely resolved, with residual linear scarring. There is persistent paramedian consolidation of the medial right middle lobe and lingula generally in keeping with atypical infection, particularly atypical mycobacterium. Interval increase in size of a small nodule of the right middle lobe, now measuring 4 mm, previously 2 mm Unchanged tiny biapical pulmonary nodules, benign sequelae of nonspecific infection or inflammation, possibly related cigarette smoking, atypical infection, or other inhalational, inflammatory lung disease. 12/27/19 CT Chest wo contrast- stable sub-centimeter bilateral pulmonary nodules consistent  with benign etiology. Stable paramedian consolidation RML and lingula. New airspace disease inferior RML with a few adjacent subcenterimeter nodules. Overall, consistent with  waxing and waning atypical infectious process such as MAI 07/24/2020 chest LDCT: Lung RADS 2, benign appearance or behavior.  Moderate centrilobular emphysema, multiple nonsolid and solid tiny nodules largest of which was 4.2 mm 12/02/2021 LDCT chest: New right middle lobe pulmonary nodule with aggressive appearance categorized as lung RADS 4 BS, suspicious.  Mild diffuse bronchial wall thickening and mild centrilobular and paraseptal emphysema imaging findings suggestive of underlying COPD 12/22/2021 PET/CT: The previously noted right middle pulmonary nodule appears to be resolving, no FDG avidity noted.  Consistent with infectious/inflammatory etiology.  Recommend continued radiographic follow-up to resolution.  Hypermetabolic activity in the intercostal muscles and crura of the diaphragm reflecting physiologic activity related to ventilation  Review of Systems A 10 point review of systems was performed and it is as noted above otherwise negative.  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/23/2017   Post PTCA 07/12/2016   iliac stents bilaterally 2017   Thrombocytopenia (HCC) 10/04/2016   Wears dentures    full upper and lower   Past Surgical History:  Procedure Laterality Date   ELECTROMAGNETIC NAVIGATION BROCHOSCOPY N/A 12/07/2017   Procedure: ELECTROMAGNETIC NAVIGATION BRONCHOSCOPY;  Surgeon: Erin Fulling, MD;  Location: [facility] ORS;  Service: Cardiopulmonary;  Laterality: N/A;    ESOPHAGOGASTRODUODENOSCOPY (EGD) WITH PROPOFOL N/A 07/08/2016   Procedure: ESOPHAGOGASTRODUODENOSCOPY (EGD) WITH PROPOFOL;  Surgeon: Scot Jun, MD;  Location: [facility] END[facility]COPY;  Service: Endoscopy;  Laterality: N/A;   KNEE SURGERY Right    repair of acl tear   PERIPHERAL VASCULAR CATHETERIZATION N/A 07/10/2016   Procedure: Lower Extremity Angiography;  Surgeon: Renford Dills, MD;  Location: [facility] INVASIVE CV LAB;  Service: Cardiovascular;  Laterality: N/A;   PERIPHERAL VASCULAR CATHETERIZATION N/A 07/10/2016   Procedure: Abdominal Aortogram w/Lower Extremity;  Surgeon: Renford Dills, MD;  Location: [facility] INVASIVE CV LAB;  Service: Cardiovascular;  Laterality: N/A;   PERIPHERAL VASCULAR CATHETERIZATION  07/10/2016   Procedure: Lower Extremity Intervention;  Surgeon: Renford Dills, MD;  Location: [facility] INVASIVE CV LAB;  Service: Cardiovascular;;   Patient Active Problem List   Diagnosis Date Noted   Preseptal cellulitis of right eye 08/29/2021   Leukopenia 11/22/2020   Herpes zoster without complication    Left leg pain 11/17/2020   Diarrhea 09/01/2020   Clostridium difficile diarrhea 08/26/2020   Lupus (HCC) 08/11/2020   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HCC) 08/11/2020   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) 08/11/2020   Papular rash 08/05/2020   Bacteremia due to Escherichia coli 07/17/2020   UTI (urinary tract infection) 07/16/2020   Hypothyroidism    Fever    Encounter for antineoplastic immunotherapy 07/15/2020   Goals of care, counseling/discussion 06/19/2020   Abnormal CT of the chest 06/04/2020   Oral candidiasis 06/04/2020   Former smoker 06/04/2020   Pulmonary nodules 12/28/2019   Stage 3a  chronic kidney disease (CKD) (HCC) 05/15/2019   Postop check 04/24/2019   Paronychia of great toe of right foot 04/17/2019   Ingrowing nail 04/17/2019   Medication monitoring encounter 03/25/2018   Intestinal infection due to enteropathogenic  E. coli 03/04/2018   Hepatitis C antibody test positive 03/04/2018   Enteritis, enteropathogenic E. 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   Elevated liver function tests 11/03/2017   Elevated uric acid in blood 11/03/2017   Low serum cortisol level 01/04/2017   Elevated alkaline phosphatase level 10/25/2016   Oral herpes simplex infection 10/25/2016   Current chronic use of systemic steroids 10/23/2016   Impetigo 10/23/2016   Thrombocytopenia (HCC) 10/04/2016   B12 deficiency 08/06/2016   Symptomatic anemia 07/21/2016   Atherosclerosis of native arteries of extremity with intermittent claudication (HCC) 07/20/2016   PAD (peripheral artery disease) (HCC) 07/12/2016   Post PTCA 07/12/2016   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   Autoimmune hemolytic anemia, 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/12/2016   Hepatitis B core antibody positive 07/12/2016   Tobacco abuse 07/12/2016   Leg pain 07/07/2016   Essential hypertension 07/07/2016   HLD (hyperlipidemia) 07/07/2016   COPD (chronic obstructive pulmonary disease) (HCC) 07/07/2016   Acute kidney injury superimposed on CKD (HCC) 07/07/2016   Heme positive stool 01/29/2015   Family History  Problem Relation Age of Onset   Diabetes Mother    Hypertension Mother    Diabetes Maternal Grandfather    Hypertension Maternal Grandfather    Breast cancer Neg Hx    Social History   Tobacco Use   Smoking status: Former    Packs/day: 1.25    Years: 47.00    Pack years: 58.75    Types: Cigarettes    Quit date: 07/11/2016    Years since quitting: 5.4   Smokeless tobacco: Never  Substance Use Topics   Alcohol use: No    Allergies  Allergen Reactions   Ciprofloxacin Swelling    Facial swelling following single oral dose.    Codeine Anaphylaxis   Nsaids  Other (See Comments)    Patient has Hep C.    Current Meds  Medication Sig   allopurinol (ZYLOPRIM) 100 MG tablet TAKE 1 TABLET BY MOUTH EVERY DAY   BACTRIM DS 800-160 MG tablet Take 1 tablet by mouth 2 (two) times daily.   clopidogrel (PLAVIX) 75 MG tablet Take 1 tablet (75 mg total) by mouth daily.   cyanocobalamin 500 MCG tablet Take 500 mcg by mouth daily.   folic acid (FOLVITE) 1 MG tablet TAKE 1 TABLET (1 MG TOTAL) BY MOUTH DAILY. (Patient taking differently: Take 1 mg by mouth every Monday, Wednesday, and Friday.)   hydroxychloroquine (PLAQUENIL) 200 MG tablet Take 200 mg by mouth 2 (two) times daily.   levothyroxine (SYNTHROID, LEVOTHROID) 75 MCG tablet Take 75 mcg by mouth daily before breakfast.    lisinopril-hydrochlorothiazide (ZESTORETIC) 20-25 MG tablet Take 1 tablet by mouth daily.   Omeprazole 20 MG TBDD Take 20 mg by mouth daily as needed (acid reflux symptoms).   predniSONE (DELTASONE) 10 MG tablet Take 1 tablet (10 mg total) by mouth See admin instructions. Take 4 tablets daily x 10 days, followed by 3 tablets daily x 7 days, followed by 2 tablets daily x 7 days, followed by 1 tablet daily x 7 days.  Immunization History  Administered Date(s) Administered   HiB (PRP-T) 04/14/2018   Influenza Split 05/29/2011, 05/25/2013   Influenza,inj,Quad PF,6+ Mos 05/17/2018, 07/02/2020   Influenza,inj,quad, With Preservative 05/17/2014   Influenza-Unspecified 05/17/2018, 05/16/2019   Meningococcal B, OMV 07/06/2019   Meningococcal Mcv4o 07/06/2019, 07/02/2020   Moderna Sars-Covid-2 Vaccination 11/14/2019, 12/08/2019   Pneumococcal Conjugate-13 04/14/2018   Pneumococcal Polysaccharide-23 07/22/2016   Tdap 05/29/2011, 10/18/2014, 11/18/2020        Objective:   Physical Exam BP (!) 142/60 (BP Location: Left Arm, Cuff Size: Normal)   Pulse 94   Temp 97.8 F (36.6 C) (Temporal)   Ht 5\' 3"  (1.6 m)   Wt 176 lb 9.6 oz (80.1 kg)   SpO2 97%   BMI 31.28 kg/m   SpO2: 97 % O2  Device: None (Room air)   GENERAL: Overweight woman, no acute distress, fully ambulatory.  No conversational dyspnea. HEAD: Normocephalic, atraumatic.  EYES: Pupils equal, round, reactive to light.  No scleral icterus.  MOUTH: Poor dentition, oral mucosa moist.  No thrush. NECK: Supple. No thyromegaly. Trachea midline. No JVD.  No adenopathy. PULMONARY: Good air entry bilaterally.  Faint end expiratory wheezes noted. CARDIOVASCULAR: S1 and S2. Regular rate and rhythm.  No rubs, murmurs or gallops heard. ABDOMEN: Obese, otherwise benign. MUSCULOSKELETAL: No joint deformity, no clubbing, no edema.  NEUROLOGIC: No overt focal deficit, no gait disturbance, speech is fluent. SKIN: Intact,warm,dry.  Multiple petechiae noted.   PSYCH: Mood and behavior normal.     Assessment & Plan:     ICD-10-CM   1. Abnormal findings on diagnostic imaging of lung  R91.8    Follow-up with chest CT in 3 months    2. Chronic obstructive pulmonary disease, unspecified COPD type (HCC)  J44.9    Recommend PFTs     Will see the patient in 3 months time after her repeat CT scan is performed she is to contact us prior to that time should any new difficulties arise.  Gailen Shelter, MD Advanced Bronchoscopy PCCM [facility]    *This note was dictated using voice recognition software/Dragon.  Despite best efforts to proofread, errors can occur which can change the meaning. Any transcriptional errors that result from this process are unintentional and may not be fully corrected at the time of dictation.

## 2022-01-06 NOTE — Telephone Encounter (Signed)
Order placed for 3 mth nodule f/u CT scan due 03/04/2022.  ?

## 2022-01-06 NOTE — Patient Instructions (Signed)
You will need a chest CT 3 months from your original low-dose CT this can be labeled as a nodule follow-up. ? ? ?We will see you back in 3 months time just to touch base again. ? ? ? ?

## 2022-01-09 ENCOUNTER — Encounter
Admit: 2022-01-09 | Discharge: 2022-01-09 | Payer: PRIVATE HEALTH INSURANCE | Attending: Certified Registered" | Primary: Certified Registered"

## 2022-01-09 ENCOUNTER — Ambulatory Visit: Admit: 2022-01-09 | Discharge: 2022-01-09 | Payer: PRIVATE HEALTH INSURANCE

## 2022-01-09 MED ORDER — ERYTHROMYCIN 5 MG/GRAM (0.5 %) EYE OINTMENT
Freq: Two times a day (BID) | OPHTHALMIC | 0 refills | 0 days | Status: CP
Start: 2022-01-09 — End: ?
  Filled 2022-01-09: qty 3.5, 7d supply, fill #0

## 2022-01-12 ENCOUNTER — Inpatient Hospital Stay: Payer: BC Managed Care – PPO

## 2022-01-12 DIAGNOSIS — D5911 Warm autoimmune hemolytic anemia: Secondary | ICD-10-CM | POA: Diagnosis not present

## 2022-01-12 DIAGNOSIS — D591 Autoimmune hemolytic anemia, unspecified: Secondary | ICD-10-CM

## 2022-01-12 LAB — CBC WITH DIFFERENTIAL/PLATELET
Abs Immature Granulocytes: 0.16 10*3/uL — ABNORMAL HIGH (ref 0.00–0.07)
Basophils Absolute: 0 10*3/uL (ref 0.0–0.1)
Basophils Relative: 0 %
Eosinophils Absolute: 0 10*3/uL (ref 0.0–0.5)
Eosinophils Relative: 1 %
HCT: 33.3 % — ABNORMAL LOW (ref 36.0–46.0)
Hemoglobin: 10.1 g/dL — ABNORMAL LOW (ref 12.0–15.0)
Immature Granulocytes: 2 %
Lymphocytes Relative: 9 %
Lymphs Abs: 0.7 10*3/uL (ref 0.7–4.0)
MCH: 31.5 pg (ref 26.0–34.0)
MCHC: 30.3 g/dL (ref 30.0–36.0)
MCV: 103.7 fL — ABNORMAL HIGH (ref 80.0–100.0)
Monocytes Absolute: 0.4 10*3/uL (ref 0.1–1.0)
Monocytes Relative: 5 %
Neutro Abs: 7.3 10*3/uL (ref 1.7–7.7)
Neutrophils Relative %: 83 %
Platelets: 112 10*3/uL — ABNORMAL LOW (ref 150–400)
RBC: 3.21 MIL/uL — ABNORMAL LOW (ref 3.87–5.11)
RDW: 17.2 % — ABNORMAL HIGH (ref 11.5–15.5)
WBC: 8.7 10*3/uL (ref 4.0–10.5)
nRBC: 0.3 % — ABNORMAL HIGH (ref 0.0–0.2)

## 2022-01-19 ENCOUNTER — Inpatient Hospital Stay: Payer: BC Managed Care – PPO

## 2022-01-19 DIAGNOSIS — D591 Autoimmune hemolytic anemia, unspecified: Secondary | ICD-10-CM

## 2022-01-19 DIAGNOSIS — D5911 Warm autoimmune hemolytic anemia: Secondary | ICD-10-CM | POA: Diagnosis not present

## 2022-01-19 LAB — CBC WITH DIFFERENTIAL/PLATELET
Abs Immature Granulocytes: 0.1 10*3/uL — ABNORMAL HIGH (ref 0.00–0.07)
Basophils Absolute: 0 10*3/uL (ref 0.0–0.1)
Basophils Relative: 0 %
Eosinophils Absolute: 0.1 10*3/uL (ref 0.0–0.5)
Eosinophils Relative: 1 %
HCT: 31.9 % — ABNORMAL LOW (ref 36.0–46.0)
Hemoglobin: 10 g/dL — ABNORMAL LOW (ref 12.0–15.0)
Immature Granulocytes: 1 %
Lymphocytes Relative: 17 %
Lymphs Abs: 1.3 10*3/uL (ref 0.7–4.0)
MCH: 32.7 pg (ref 26.0–34.0)
MCHC: 31.3 g/dL (ref 30.0–36.0)
MCV: 104.2 fL — ABNORMAL HIGH (ref 80.0–100.0)
Monocytes Absolute: 0.5 10*3/uL (ref 0.1–1.0)
Monocytes Relative: 6 %
Neutro Abs: 5.9 10*3/uL (ref 1.7–7.7)
Neutrophils Relative %: 75 %
Platelets: 95 10*3/uL — ABNORMAL LOW (ref 150–400)
RBC: 3.06 MIL/uL — ABNORMAL LOW (ref 3.87–5.11)
RDW: 17.2 % — ABNORMAL HIGH (ref 11.5–15.5)
WBC: 7.8 10*3/uL (ref 4.0–10.5)
nRBC: 0.3 % — ABNORMAL HIGH (ref 0.0–0.2)

## 2022-01-27 ENCOUNTER — Inpatient Hospital Stay: Payer: BC Managed Care – PPO

## 2022-01-27 DIAGNOSIS — D591 Autoimmune hemolytic anemia, unspecified: Secondary | ICD-10-CM

## 2022-01-27 DIAGNOSIS — D5911 Warm autoimmune hemolytic anemia: Secondary | ICD-10-CM | POA: Diagnosis not present

## 2022-01-27 LAB — CBC WITH DIFFERENTIAL/PLATELET
Abs Immature Granulocytes: 0.04 10*3/uL (ref 0.00–0.07)
Basophils Absolute: 0 10*3/uL (ref 0.0–0.1)
Basophils Relative: 0 %
Eosinophils Absolute: 0.1 10*3/uL (ref 0.0–0.5)
Eosinophils Relative: 1 %
HCT: 28.8 % — ABNORMAL LOW (ref 36.0–46.0)
Hemoglobin: 8.9 g/dL — ABNORMAL LOW (ref 12.0–15.0)
Immature Granulocytes: 1 %
Lymphocytes Relative: 18 %
Lymphs Abs: 1.3 10*3/uL (ref 0.7–4.0)
MCH: 32.2 pg (ref 26.0–34.0)
MCHC: 30.9 g/dL (ref 30.0–36.0)
MCV: 104.3 fL — ABNORMAL HIGH (ref 80.0–100.0)
Monocytes Absolute: 0.4 10*3/uL (ref 0.1–1.0)
Monocytes Relative: 6 %
Neutro Abs: 5.5 10*3/uL (ref 1.7–7.7)
Neutrophils Relative %: 74 %
Platelets: 107 10*3/uL — ABNORMAL LOW (ref 150–400)
RBC: 2.76 MIL/uL — ABNORMAL LOW (ref 3.87–5.11)
RDW: 17.2 % — ABNORMAL HIGH (ref 11.5–15.5)
WBC: 7.3 10*3/uL (ref 4.0–10.5)
nRBC: 0 % (ref 0.0–0.2)

## 2022-01-28 ENCOUNTER — Emergency Department
Admission: EM | Admit: 2022-01-28 | Discharge: 2022-01-28 | Disposition: A | Discharge status: Home / Self Care | Payer: BC Managed Care – PPO | Attending: Emergency Medicine | Admitting: Emergency Medicine

## 2022-01-28 ENCOUNTER — Other Ambulatory Visit: Payer: Self-pay

## 2022-01-28 DIAGNOSIS — E039 Hypothyroidism, unspecified: Secondary | ICD-10-CM | POA: Diagnosis not present

## 2022-01-28 DIAGNOSIS — I129 Hypertensive chronic kidney disease with stage 1 through stage 4 chronic kidney disease, or unspecified chronic kidney disease: Secondary | ICD-10-CM | POA: Diagnosis not present

## 2022-01-28 DIAGNOSIS — Z87891 Personal history of nicotine dependence: Secondary | ICD-10-CM | POA: Diagnosis not present

## 2022-01-28 DIAGNOSIS — L03116 Cellulitis of left lower limb: Secondary | ICD-10-CM | POA: Diagnosis not present

## 2022-01-28 DIAGNOSIS — J449 Chronic obstructive pulmonary disease, unspecified: Secondary | ICD-10-CM | POA: Diagnosis not present

## 2022-01-28 DIAGNOSIS — N1831 Chronic kidney disease, stage 3a: Secondary | ICD-10-CM | POA: Diagnosis not present

## 2022-01-28 DIAGNOSIS — T148XXA Other injury of unspecified body region, initial encounter: Secondary | ICD-10-CM

## 2022-01-28 DIAGNOSIS — L0390 Cellulitis, unspecified: Secondary | ICD-10-CM

## 2022-01-28 DIAGNOSIS — S80822A Blister (nonthermal), left lower leg, initial encounter: Secondary | ICD-10-CM | POA: Diagnosis present

## 2022-01-28 DIAGNOSIS — X58XXXA Exposure to other specified factors, initial encounter: Secondary | ICD-10-CM | POA: Diagnosis not present

## 2022-01-28 DIAGNOSIS — S80812A Abrasion, left lower leg, initial encounter: Secondary | ICD-10-CM | POA: Diagnosis not present

## 2022-01-28 LAB — CBC WITH DIFFERENTIAL/PLATELET
Abs Immature Granulocytes: 0.06 10*3/uL (ref 0.00–0.07)
Basophils Absolute: 0 10*3/uL (ref 0.0–0.1)
Basophils Relative: 1 %
Eosinophils Absolute: 0 10*3/uL (ref 0.0–0.5)
Eosinophils Relative: 0 %
HCT: 29.7 % — ABNORMAL LOW (ref 36.0–46.0)
Hemoglobin: 9 g/dL — ABNORMAL LOW (ref 12.0–15.0)
Immature Granulocytes: 1 %
Lymphocytes Relative: 10 %
Lymphs Abs: 0.7 10*3/uL (ref 0.7–4.0)
MCH: 31.9 pg (ref 26.0–34.0)
MCHC: 30.3 g/dL (ref 30.0–36.0)
MCV: 105.3 fL — ABNORMAL HIGH (ref 80.0–100.0)
Monocytes Absolute: 0.4 10*3/uL (ref 0.1–1.0)
Monocytes Relative: 6 %
Neutro Abs: 6 10*3/uL (ref 1.7–7.7)
Neutrophils Relative %: 82 %
Platelets: 116 10*3/uL — ABNORMAL LOW (ref 150–400)
RBC: 2.82 MIL/uL — ABNORMAL LOW (ref 3.87–5.11)
RDW: 17.3 % — ABNORMAL HIGH (ref 11.5–15.5)
WBC: 7.2 10*3/uL (ref 4.0–10.5)
nRBC: 0 % (ref 0.0–0.2)

## 2022-01-28 LAB — BASIC METABOLIC PANEL
Anion gap: 9 (ref 5–15)
BUN: 37 mg/dL — ABNORMAL HIGH (ref 8–23)
CO2: 28 mmol/L (ref 22–32)
Calcium: 9.1 mg/dL (ref 8.9–10.3)
Chloride: 104 mmol/L (ref 98–111)
Creatinine, Ser: 1.18 mg/dL — ABNORMAL HIGH (ref 0.44–1.00)
GFR, Estimated: 52 mL/min — ABNORMAL LOW (ref >60)
Glucose, Bld: 119 mg/dL — ABNORMAL HIGH (ref 70–99)
Potassium: 4 mmol/L (ref 3.5–5.1)
Sodium: 141 mmol/L (ref 135–145)

## 2022-01-28 MED ORDER — DOXYCYCLINE MONOHYDRATE 100 MG PO TABS
100.0000 mg | ORAL_TABLET | Freq: Two times a day (BID) | ORAL | 0 refills | Status: AC
Start: 2022-01-28 — End: 2022-02-04

## 2022-01-28 MED ORDER — CEPHALEXIN 500 MG PO CAPS: 500.0000 mg | ORAL_CAPSULE | Freq: Four times a day (QID) | ORAL | 0 refills | Status: AC

## 2022-01-28 NOTE — ED Provider Notes (Signed)
[facility] Provider Note    Event Date/Time   First MD Initiated Contact with Patient 01/28/22 1025     (approximate)   History   Blister   HPI  Tina Page is a 66 y.o. female with a past medical history of lupus, hypothyroidism, bacteremia, CKD stage III peripheral artery disease, hypertension, hyperlipidemia new presents today for evaluation of left leg blistering.  Patient reports that she slammed her leg in the door on Thursday, and sustained a small abrasion to the lateral aspect of her left leg.  She reports that yesterday she noticed additional blistering along the lateral aspect of her left leg which caused her concern.  She has not had any fevers or chills.  She has not noticed any red streaks up her leg.  She denies swelling or discoloration.  She has been applying bacitracin.  Patient Active Problem List   Diagnosis Date Noted   Preseptal cellulitis of right eye 08/29/2021   Leukopenia 11/22/2020   Herpes zoster without complication    Left leg pain 11/17/2020   Diarrhea 09/01/2020   Clostridium difficile diarrhea 08/26/2020   Lupus ([place]) 08/11/2020   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[place]) 08/11/2020   Chronic viral hepatitis B without delta agent and without coma ([place]) 08/11/2020   Papular rash 08/05/2020   Bacteremia due to Escherichia coli 07/17/2020   UTI (urinary tract infection) 07/16/2020   Hypothyroidism    Fever    Encounter for antineoplastic immunotherapy 07/15/2020   Goals of care, counseling/discussion 06/19/2020   Abnormal CT of the chest 06/04/2020   Oral candidiasis 06/04/2020   Former smoker 06/04/2020   Pulmonary nodules 12/28/2019   Stage 3a chronic kidney disease (CKD) ([place]) 05/15/2019   Postop check 04/24/2019   Paronychia of great toe of right foot 04/17/2019   Ingrowing nail 04/17/2019   Medication monitoring encounter 03/25/2018   Intestinal infection due to enteropathogenic E. coli 03/04/2018   Hepatitis  C antibody test positive 03/04/2018   Enteritis, enteropathogenic E. 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   Elevated liver function tests 11/03/2017   Elevated uric acid in blood 11/03/2017   Low serum cortisol level 01/04/2017   Elevated alkaline phosphatase level 10/25/2016   Oral herpes simplex infection 10/25/2016   Current chronic use of systemic steroids 10/23/2016   Impetigo 10/23/2016   Thrombocytopenia ([place]) 10/04/2016   B12 deficiency 08/06/2016   Symptomatic anemia 07/21/2016   Atherosclerosis of native arteries of extremity with intermittent claudication ([place]) 07/20/2016   PAD (peripheral artery disease) ([place]) 07/12/2016   Post PTCA 07/12/2016   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   Autoimmune hemolytic anemia, 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   Hepatitis B core antibody positive 07/12/2016   Tobacco abuse 07/12/2016   Leg pain 07/07/2016   Essential hypertension 07/07/2016   HLD (hyperlipidemia) 07/07/2016   COPD (chronic obstructive pulmonary disease) ([place]) 07/07/2016   Acute kidney injury superimposed on CKD ([place]) 07/07/2016   Heme positive stool 01/29/2015          Physical Exam   Triage Vital Signs: ED Triage Vitals [01/28/22 0930]  Enc Vitals Group     BP 132/67     Pulse Rate 88     Resp 18     Temp 97.6 F (36.4 C)     Temp Source  Oral     SpO2 100 %     Weight 178 lb (80.7 kg)     Height '5\' 3"'$  (1.6 m)     Head Circumference      Peak Flow      Pain Score 4     Pain Loc      Pain Edu?      Excl. in [place]?     Most recent vital signs: Vitals:   01/28/22 0930  BP: 132/67  Pulse: 88  Resp: 18  Temp: 97.6 F (36.4 C)  SpO2: 100%    Physical Exam Vitals and nursing note reviewed.  Constitutional:      General: Awake and alert. No acute  distress.    Appearance: Normal appearance. She is well-developed and normal weight.  HENT:     Head: Normocephalic and atraumatic.     Mouth/Throat: No intraoral lesions    Mouth: Mucous membranes are moist.  Eyes:     General: PERRL. Normal EOMs        Right eye: No discharge.        Left eye: No discharge.     Conjunctiva/sclera: Conjunctivae normal.  Cardiovascular:     Rate and Rhythm: Normal rate and regular rhythm.     Pulses: Normal pulses.     Heart sounds: Normal heart sounds Pulmonary:     Effort: Pulmonary effort is normal. No respiratory distress.     Breath sounds: Normal breath sounds.  Abdominal:     Abdomen is soft. There is no abdominal tenderness. Musculoskeletal:        General: No swelling. Normal range of motion.     Cervical back: Normal range of motion and neck supple.  Lymphadenopathy:     Cervical: No cervical adenopathy.  Skin:    General: Skin is warm and dry.     Capillary Refill: Capillary refill takes less than 2 seconds.     Findings: No rash.  Left lateral leg: 0.5 x 0.5 cm abrasion with very minimal surrounding erythema.  Nontender to palpation.  There is a 1 x 1 cm pustule noted distally, and 2 smaller pustules noted more proximally.  Minimally tender to palpation.  No lymphangitis.  Surrounding erythema to each pustule.  No crepitus.  No swelling of the leg.  No circumferential swelling or erythema.  Normal range of motion.  No joint swelling or erythema. Neurological:     Mental Status: She is alert.      ED Results / Procedures / Treatments   Labs (all labs ordered are listed, but only abnormal results are displayed) Labs Reviewed  CBC WITH DIFFERENTIAL/PLATELET - Abnormal; Notable for the following components:      Result Value   RBC 2.82 (*)    Hemoglobin 9.0 (*)    HCT 29.7 (*)    MCV 105.3 (*)    RDW 17.3 (*)    Platelets 116 (*)    All other components within normal limits  BASIC METABOLIC PANEL - Abnormal; Notable for the  following components:   Glucose, Bld 119 (*)    BUN 37 (*)    Creatinine, Ser 1.18 (*)    GFR, Estimated 52 (*)    All other components within normal limits     EKG     RADIOLOGY     PROCEDURES:  Critical Care performed:   Procedures   MEDICATIONS ORDERED IN ED: Medications - No data to display   IMPRESSION / MDM / ASSESSMENT AND  PLAN / ED COURSE  I reviewed the triage vital signs and the nursing notes.   Differential diagnosis includes, but is not limited to, cellulitis, rash, abscess.  Patient is awake and alert, hemodynamically stable and afebrile. Physical exam is consistent with developing cellulitis. No crepitus or bullae or hemodynamic instability or pain out of proportion to indicate deep space infection. No fluctuance to suggest cystic lesion such as abscess. No fevers or constitutional symptoms to suggest systemic infection.  Physical exam does not show any evidence of joint involvement. No lymphangitis.  She has normal pedal pulses, no crepitus or lymphangitis or pain out of proportion.  She has 3 pustules. Patient will initiate oral antibiotics and follow-up closely as an outpatient. Return if worsening or developing new symptoms in which case may require IV antibiotics. Discussed care plan, return precautions, and advised close outpatient follow-up. Patient agrees with plan of care.   Patient's presentation is most consistent with acute complicated illness / injury requiring diagnostic workup.    FINAL CLINICAL IMPRESSION(S) / ED DIAGNOSES   Final diagnoses:  Cellulitis, unspecified cellulitis site  Blister     Rx / DC Orders   ED Discharge Orders          Ordered    cephALEXin (KEFLEX) 500 MG capsule  4 times daily        01/28/22 1211    doxycycline (ADOXA) 100 MG tablet  2 times daily        01/28/22 1211             Note:  This document was prepared using Dragon voice recognition software and may include unintentional dictation  errors.   Emeline Gins 01/28/22 1353    Carrie Mew, MD 01/28/22 [phone]

## 2022-01-28 NOTE — Discharge Instructions (Addendum)
Please take the antibiotics as prescribed.  Please return for any new, worsening, or change in symptoms or other concerns.  Is a pleasure caring for you today.

## 2022-01-28 NOTE — ED Notes (Signed)
Pt states she caught the corner of the car door on her LLE, states yesterday she noticed a blistery rash come up around the area where she hit her leg with pain and swelling, warm to touch.

## 2022-01-28 NOTE — ED Triage Notes (Signed)
Pt here with left leg blisters. Pt was closing a door last Thurs and hit her outer leg. Pt woke up yesterday with several small, red blisters that are painful. Pt states she but bacitracin on it but that has not helped. Pt denies weeping or bleeding.

## 2022-02-02 ENCOUNTER — Inpatient Hospital Stay (HOSPITAL_BASED_OUTPATIENT_CLINIC_OR_DEPARTMENT_OTHER): Payer: BC Managed Care – PPO | Admitting: Oncology

## 2022-02-02 ENCOUNTER — Encounter: Payer: Self-pay | Admitting: Oncology

## 2022-02-02 ENCOUNTER — Inpatient Hospital Stay: Payer: BC Managed Care – PPO | Attending: Oncology

## 2022-02-02 VITALS — BP 131/54 | HR 89 | Temp 96.9°F | Wt 177.0 lb

## 2022-02-02 DIAGNOSIS — N1832 Chronic kidney disease, stage 3b: Secondary | ICD-10-CM

## 2022-02-02 DIAGNOSIS — R768 Other specified abnormal immunological findings in serum: Secondary | ICD-10-CM | POA: Diagnosis not present

## 2022-02-02 DIAGNOSIS — R918 Other nonspecific abnormal finding of lung field: Secondary | ICD-10-CM | POA: Insufficient documentation

## 2022-02-02 DIAGNOSIS — L03116 Cellulitis of left lower limb: Secondary | ICD-10-CM | POA: Diagnosis not present

## 2022-02-02 DIAGNOSIS — D649 Anemia, unspecified: Secondary | ICD-10-CM | POA: Diagnosis not present

## 2022-02-02 DIAGNOSIS — D696 Thrombocytopenia, unspecified: Secondary | ICD-10-CM

## 2022-02-02 DIAGNOSIS — D5911 Warm autoimmune hemolytic anemia: Secondary | ICD-10-CM | POA: Insufficient documentation

## 2022-02-02 DIAGNOSIS — B1920 Unspecified viral hepatitis C without hepatic coma: Secondary | ICD-10-CM | POA: Insufficient documentation

## 2022-02-02 DIAGNOSIS — R7989 Other specified abnormal findings of blood chemistry: Secondary | ICD-10-CM | POA: Insufficient documentation

## 2022-02-02 DIAGNOSIS — D472 Monoclonal gammopathy: Secondary | ICD-10-CM | POA: Insufficient documentation

## 2022-02-02 DIAGNOSIS — Z87891 Personal history of nicotine dependence: Secondary | ICD-10-CM | POA: Insufficient documentation

## 2022-02-02 DIAGNOSIS — D591 Autoimmune hemolytic anemia, unspecified: Secondary | ICD-10-CM

## 2022-02-02 LAB — COMPREHENSIVE METABOLIC PANEL
ALT: 19 U/L (ref 0–44)
AST: 18 U/L (ref 15–41)
Albumin: 3.9 g/dL (ref 3.5–5.0)
Alkaline Phosphatase: 46 U/L (ref 38–126)
Anion gap: 7 (ref 5–15)
BUN: 38 mg/dL — ABNORMAL HIGH (ref 8–23)
CO2: 29 mmol/L (ref 22–32)
Calcium: 9.1 mg/dL (ref 8.9–10.3)
Chloride: 104 mmol/L (ref 98–111)
Creatinine, Ser: 1.34 mg/dL — ABNORMAL HIGH (ref 0.44–1.00)
GFR, Estimated: 44 mL/min — ABNORMAL LOW (ref >60)
Glucose, Bld: 135 mg/dL — ABNORMAL HIGH (ref 70–99)
Potassium: 3.8 mmol/L (ref 3.5–5.1)
Sodium: 140 mmol/L (ref 135–145)
Total Bilirubin: 1.1 mg/dL (ref 0.3–1.2)
Total Protein: 6.8 g/dL (ref 6.5–8.1)

## 2022-02-02 LAB — CBC WITH DIFFERENTIAL/PLATELET
Abs Immature Granulocytes: 0.03 10*3/uL (ref 0.00–0.07)
Basophils Absolute: 0 10*3/uL (ref 0.0–0.1)
Basophils Relative: 1 %
Eosinophils Absolute: 0 10*3/uL (ref 0.0–0.5)
Eosinophils Relative: 1 %
HCT: 29.6 % — ABNORMAL LOW (ref 36.0–46.0)
Hemoglobin: 9.4 g/dL — ABNORMAL LOW (ref 12.0–15.0)
Immature Granulocytes: 1 %
Lymphocytes Relative: 9 %
Lymphs Abs: 0.6 10*3/uL — ABNORMAL LOW (ref 0.7–4.0)
MCH: 32.4 pg (ref 26.0–34.0)
MCHC: 31.8 g/dL (ref 30.0–36.0)
MCV: 102.1 fL — ABNORMAL HIGH (ref 80.0–100.0)
Monocytes Absolute: 0.3 10*3/uL (ref 0.1–1.0)
Monocytes Relative: 4 %
Neutro Abs: 5.6 10*3/uL (ref 1.7–7.7)
Neutrophils Relative %: 84 %
Platelets: 160 10*3/uL (ref 150–400)
RBC: 2.9 MIL/uL — ABNORMAL LOW (ref 3.87–5.11)
RDW: 16.3 % — ABNORMAL HIGH (ref 11.5–15.5)
WBC: 6.6 10*3/uL (ref 4.0–10.5)
nRBC: 0 % (ref 0.0–0.2)

## 2022-02-02 MED ORDER — PREDNISONE 10 MG PO TABS: 10.0000 mg | ORAL_TABLET | Freq: Every day | ORAL | 1 refills | Status: DC

## 2022-02-02 NOTE — Progress Notes (Signed)
Hematology/Oncology Progress note [phone] [phone]      Clinic Day:  02/02/2022  Referring physician: Frazier Richards, MD  Chief Complaint: Tina Page is a 66 y.o. 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  Reticulocyte count was 11.3%.  Ferritin was 562.  Iron studies revealed a saturation of 20% and a TIBC of 214 (low).  B12 was 231 (low normal).  Folate was 42.   Peripheral smear revealed rouleaux formation.    Additional testing included the following + studies:  hepatitis C antibody, hepatitis B core antibody, CMV IgM, and EBV VCA (IgM and IgG).  Hepatitis B by PCR was negative.  Hepatitis C RNA was negative.  Mycoplasma pneumonia IgM was negative.  Reticulocyte count was 11.3% (high) indicating appropriate marrow response.  C3 and C4 were normal.  Cold agglutinin titer was negative x 2.  Negative studies included:  ANA, hepatitis B surface antigen, SPEP, and free light chain ratio.   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  MMA was initially 330 (normal).  Repeat MMA was elevated on 08/01/2016 confirming B12 deficiency.  Hepatitis B surface antibody was positive and hepatitis B surface antigen was negative on 08/06/2016.   07/07/2016  Chest, abdomen, and pelvis CT angiogram on revealed moderate diffuse atherosclerotic vascular disease of the abdominal aorta with severe stenosis of the left common iliac artery with suspected short segment occlusion. There was no adenopathy.  Spleen was normal.   04/15/2017 Abd US revealed a normal spleen and sludge in the gallbladder.  The liver was echogenic  consistent fatty infiltration and/or hepatocellular disease.  For hemolytic anemia treatments, in 2017, She has received multiple units of  warmed PRBCs to date.  08/01/2016-03/12/2017, steroid treatment with prednisone.  She is also on folic acid 1 mg daily. Borderline low vitamin B12 level on 07/09/2016.  Patient has been on oral vitamin B12 supplementation.  10/12/2017  positive warm autoantibody.  LDH and bilirubin were normal.   She began prednisone 10 mg on 12/07/2017 (discontinued 03/2018).  She received Rituxan weekly x 4 (last dose 06/16/2018).  Entecavir was discontinued on 07/06/2019.  06/12/2020 developed recurrent anemia.  Hematocrit was 27.6, hemoglobin 8.4, MCV 104.5, platelets 138,000, WBC 3,400 (ANC 2,300). Creatinine was 1.17 (CrCl 50 ml/min).  Positive warm autoantibody  Reticulocyte count was 7.1%.  LDH was 204 (98-192).  06/16/2020, began prednisone 1 mg/kilogram treatments, 07/24/2020, prednisone was tapered down to 20 mg.  She received Rituxan 1000 mg on day 1 and 15 (07/15/2020 and 07/29/2020).  She began entecavir on 06/28/2020.  Prednisone was tapered off.  She was on Septra for PCP prophylaxis which is now currently on hold    Other medical problems 07/10/2016.  She underwent PTCA and stent placement in right and left common iliac arteries and left external iliac artery. She is on Plavix.   03/19/2015 EGD revealed gastritis in the body and antrum.  Colonoscopy revealed one hyperplastic polyp.  Repeat EGD on 07/08/2016 was normal.  No evidence of bleeding.  History of she developed peri-oral and intranasal herpes simplex-1.  She was treated with valacyclovir and doxycycline on 10/19/2016.   10/26/2017. She developed flu like symptoms.  Symptoms included cough,  myalgias, and fever (tmax unknown).  She was prescribed Mucinex, Tamiflu, and amoxicillin.  She only took amoxicillin x 5 days.  She developed increased liver function tests on 11/02/2017. CMV IgG was positive.  EBV VCA  IgG, NA IgG, early antigen antibody IgG were elevated.  EBV VCA IgM was < 36.  Testing was c/w a convalescence/past infection or reactivated infection.  LFTs normalized on 11/15/2017.  Smooth muscle antibody was 39 (high) on 11/10/2017 and 34 (high) on 11/30/2017.     Chest CT on 11/16/2017 revealed a 2.2 x 2.0 x 2.4 spiculated RIGHT middle lobe mass.  There was tiny nonspecific upper lobe pulmonary nodules greater on RIGHT, largest 3 mm, of uncertain etiology.  There was additional 8 x 8 mm opacity in the posterior sulcus of the RIGHT lower lobe. PET scan on 11/22/2017 revealed a 2 cm hypermetabolic right middle lobe lung mass (SUV 3.4) consistent  with primary lung neoplasm.  There were no findings for mediastinal/hilar lymphadenopathy or metastatic disease.  There were areas of hypermetabolism involving the left oblique abdominal muscles and the anorectal junction. PFTs on 11/18/2017 revealed an FEV1 of 1.22 liters (51%).  DLCO adj was 6.8 mL/mmHg/min (32%).   ENB done on 12/07/2017. Cytology was negative for malignancy. Pathology demonstrated organizing pneumonia and chronic bronchitis. Pathologist commented that organizing pneumonia spanned about 2 mm in one fragment. Cases of focal organizing pneumonia with hypermetabolism on FDG-PET have been described, but changes of this type can also be adjacent to a neoplasm. Patient prescribed a daily dose of Prednisone 10 mg   07/24/2020  repeat  chest CT on  revealed Lung-RADS 2, benign appearance or behavior. There was emphysema, aortic atherosclerosis, and coronary artery calcifications.  diarrhea.  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  She received azithromycin x 3 days.  She received ciprofloxacin x 10 days without improvement.  She has seen ID in [place].  She began Bactrim on 03/04/2018 (discontinued on 03/11/2018) secondary to increase in creatine.   Chronic kidney disease Urinalysis on 11/15/2017 revealed  revealed no hemoglobin, bilirubin or active sediment.   Liver function tests increased on 02/15/2019.  Work-up on 02/16/2019 revealed hepatitis B E antibody positive.  Negative studies included: hepatitis A total antibody, hepatitis A IgM, hepatitis B surface antigen, hepatitis B E antigen.  Hepatitis B DNA was not detected.  Folate was 80.5 and vitamin B12 was 443.  She has received her vaccinations:  She has completed the PCV13, PPSV23, and HiB.  She received Menvio (quadrivalent meningoccal vaccine) and Bexero (univalent serogroup B meningoccal vaccine) on 07/06/2019.  She was diagnosed with C difficile + diarrhea on 08/12/2020.  She completed a course of oral vancomycin.  Stool was negative for C. difficile on 08/27/2020.  11/17/2020 - 11/20/2020 [place] admission with left L5 varicella zoster.  Lesion was positive for VZV. She was treated with ceftazidime and a valacyclovir.  She was discharged on 9 days of Valtrex 1 gm 3 times daily followed by 500 mg daily indefinitely for suppression and doxycycline.  History of discoid lupus  #Positive hepatitis B core antibody Previously on entecavir 0.5 mg daily, currently she has finished a course. Dose adjusted based on renal function. CrCl 30-49 ml/min   50% dosing (0.5 mg QOD) CrCl >= 50 ml/min 100% dosing (0.5 mg a day). With her kidney function currently QOD She has completed course of Entecavir and stopped 08/14/2021  # PCP prophylaxis Patient was on Septra DS on Mondays, Wednesdays, and  Fridays, which was held due to fluctuated kidney functions.  Currently off Septra.  12/03/2021, CT chest lung cancer screening showed new right middle lobe pulmonary nodule with an aggressive appearance characterized as lung RADS 4 VS.  12/22/2021 PET showed No abnormal FDG avidity associated with the resolving right middle lobe pulmonary nodule now with a 12 mm ground-glass opacity in the area of prior solid nodularity, most consistent with an infectious or  inflammatory etiology  INTERVAL HISTORY GRAY MAUGERI is a 66 y.o. female who has above history reviewed by me today presents for follow up visit  Patient has been a course of prednisone tapering.  She is currently on 10 mg daily. Patient's CBC has been monitored weekly.  Hemoglobin slightly increased to low tens and then decreased again.  Today hemoglobin 9.4.  MCV 102. During interval, she has had skin cellulitis of left lower extremity and has been on Keflex antibiotics treatments.  She wrapped her left lower extremity and has plan to follow-up with primary care provider.  Denies any fever, chills.    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   Elevated liver function tests 11/03/2017   GERD (gastroesophageal reflux disease)    Heme positive stool 01/29/2015   Hepatitis C 07/12/2016   Ab positive, RNA negative   HLD (hyperlipidemia)    Hypertension    Hypothyroidism    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/23/2017   Post PTCA 07/12/2016   iliac stents bilaterally 2017   Thrombocytopenia ([place]) 10/04/2016   Wears dentures    full upper and lower    Past Surgical History:  Procedure Laterality Date   ELECTROMAGNETIC NAVIGATION BROCHOSCOPY N/A 12/07/2017   Procedure: ELECTROMAGNETIC NAVIGATION BRONCHOSCOPY;  Surgeon: Flora Lipps, MD;  Location: [facility] ORS;  Service: Cardiopulmonary;  Laterality: N/A;   ESOPHAGOGASTRODUODENOSCOPY (EGD) WITH PROPOFOL N/A 07/08/2016   Procedure: ESOPHAGOGASTRODUODENOSCOPY (EGD) WITH PROPOFOL;  Surgeon: Manya Silvas, MD;  Location: [facility] ENDOSCOPY;  Service: Endoscopy;  Laterality: N/A;   KNEE SURGERY Right    repair of acl tear   PERIPHERAL VASCULAR CATHETERIZATION N/A 07/10/2016   Procedure: Lower Extremity Angiography;   Surgeon: Katha Cabal, MD;  Location: [place] [facility];  Service: Cardiovascular;  Laterality: N/A;   PERIPHERAL VASCULAR CATHETERIZATION N/A 07/10/2016   Procedure: Abdominal Aortogram w/Lower Extremity;  Surgeon: Katha Cabal, MD;  Location: [place] [facility];  Service: Cardiovascular;  Laterality: N/A;   PERIPHERAL VASCULAR CATHETERIZATION  07/10/2016   Procedure: Lower Extremity Intervention;  Surgeon: Katha Cabal, MD;  Location: [place] [facility];  Service: Cardiovascular;;    Family History  Problem Relation Age of Onset   Diabetes Mother    Hypertension Mother    Diabetes Maternal Grandfather    Hypertension Maternal Grandfather    Breast cancer Neg Hx     Social History:  reports that she quit smoking about 5 years ago. Her smoking use included cigarettes. She has a 58.75 pack-year smoking history. She has never used smokeless tobacco. She reports that she does not drink alcohol and does not use drugs.  Former smoker, 58-pack-year smoking history.   She has a daughter, [name] and a daughter named [name].  She lives in [place]. The patient is alone today.  Allergies:  Allergies  Allergen  Reactions   Ciprofloxacin Swelling    Facial swelling following single oral dose.    Codeine Anaphylaxis   Nsaids Other (See Comments)    Patient has Hep C.     Current Medications: Current Outpatient Medications  Medication Sig Dispense Refill   albuterol (VENTOLIN HFA) 108 (90 Base) MCG/ACT inhaler SMARTSIG:2 Puff(s) By Mouth Every 4 Hours PRN     allopurinol (ZYLOPRIM) 100 MG tablet TAKE 1 TABLET BY MOUTH EVERY DAY 30 tablet 5   cephALEXin (KEFLEX) 500 MG capsule Take 1 capsule (500 mg total) by mouth 4 (four) times daily for 10 days. 40 capsule 0   clopidogrel (PLAVIX) 75 MG tablet Take 1 tablet (75 mg total) by mouth daily. 30 tablet 5   cyanocobalamin 500 MCG tablet Take 500 mcg by mouth daily.     doxycycline (ADOXA) 100 MG tablet Take 1 tablet (100 mg  total) by mouth 2 (two) times daily for 7 days. 14 tablet 0   folic acid (FOLVITE) 1 MG tablet TAKE 1 TABLET (1 MG TOTAL) BY MOUTH DAILY. (Patient taking differently: Take 1 mg by mouth every Monday, Wednesday, and Friday.) 90 tablet 1   hydroxychloroquine (PLAQUENIL) 200 MG tablet Take 200 mg by mouth 2 (two) times daily.     levothyroxine (SYNTHROID, LEVOTHROID) 75 MCG tablet Take 75 mcg by mouth daily before breakfast.      lisinopril-hydrochlorothiazide (ZESTORETIC) 20-25 MG tablet Take 1 tablet by mouth daily. 90 tablet 4   Omeprazole 20 MG TBDD Take 20 mg by mouth daily as needed (acid reflux symptoms).     predniSONE (DELTASONE) 10 MG tablet Take 1 tablet (10 mg total) by mouth daily with breakfast. 30 tablet 1   rosuvastatin (CRESTOR) 40 MG tablet Take 1 tablet (40 mg total) by mouth daily. 90 tablet 3   No current facility-administered medications for this visit.    Review of Systems  Constitutional:  Negative for appetite change, chills, fatigue and fever.  HENT:   Negative for hearing loss and voice change.   Eyes:  Negative for eye problems.  Respiratory:  Negative for chest tightness and cough.   Cardiovascular:  Negative for chest pain.  Gastrointestinal:  Negative for abdominal distention, abdominal pain and blood in stool.  Endocrine: Negative for hot flashes.  Genitourinary:  Negative for difficulty urinating and frequency.   Musculoskeletal:  Negative for arthralgias.  Skin:  Negative for itching and rash.  Neurological:  Negative for extremity weakness.  Hematological:  Negative for adenopathy.  Psychiatric/Behavioral:  Negative for confusion. The patient is not nervous/anxious.     Performance status (ECOG): 1  Vital Signs Blood pressure (!) 131/54, pulse 89, temperature (!) 96.9 F (36.1 C), temperature source Tympanic, weight 177 lb (80.3 kg).  Physical Exam Constitutional:      General: She is not in acute distress.    Appearance: She is obese. She is not  diaphoretic.  HENT:     Head: Normocephalic and atraumatic.     Nose: Nose normal.     Mouth/Throat:     Pharynx: No oropharyngeal exudate.  Eyes:     General: No scleral icterus.    Pupils: Pupils are equal, round, and reactive to light.  Cardiovascular:     Rate and Rhythm: Normal rate and regular rhythm.     Heart sounds: No murmur heard. Pulmonary:     Effort: Pulmonary effort is normal. No respiratory distress.     Breath sounds: No rales.  Chest:  Chest wall: No tenderness.  Abdominal:     General: There is no distension.     Palpations: Abdomen is soft.     Tenderness: There is no abdominal tenderness.  Musculoskeletal:        General: Normal range of motion.     Cervical back: Normal range of motion and neck supple.  Skin:    General: Skin is warm and dry.     Findings: No erythema.     Comments: Left lower extremity cellulitis  Neurological:     Mental Status: She is alert and oriented to person, place, and time.     Cranial Nerves: No cranial nerve deficit.     Motor: No abnormal muscle tone.     Coordination: Coordination normal.  Psychiatric:        Mood and Affect: Affect normal.    Laboratory findings CBC    Component Value Date/Time   WBC 6.6 02/02/2022 1222   RBC 2.90 (L) 02/02/2022 1222   HGB 9.4 (L) 02/02/2022 1222   HGB 9.0 (L) 02/18/2018 1204   HCT 29.6 (L) 02/02/2022 1222   HCT 46.5 06/11/2014 1732   PLT 160 02/02/2022 1222   PLT 127 (L) 06/11/2014 1732   MCV 102.1 (H) 02/02/2022 1222   MCV 92 06/11/2014 1732   MCH 32.4 02/02/2022 1222   MCHC 31.8 02/02/2022 1222   RDW 16.3 (H) 02/02/2022 1222   RDW 14.5 06/11/2014 1732   LYMPHSABS 0.6 (L) 02/02/2022 1222   MONOABS 0.3 02/02/2022 1222   EOSABS 0.0 02/02/2022 1222   BASOSABS 0.0 02/02/2022 1222      Latest Ref Rng & Units 02/02/2022   12:22 PM 01/28/2022   11:33 AM 01/02/2022    9:52 AM  CMP  Glucose 70 - 99 mg/dL 135   119   73    BUN 8 - 23 mg/dL 38   37   46    Creatinine 0.44 -  1.00 mg/dL 1.34   1.18   1.60    Sodium 135 - 145 mmol/L 140   141   135    Potassium 3.5 - 5.1 mmol/L 3.8   4.0   4.7    Chloride 98 - 111 mmol/L 104   104   104    CO2 22 - 32 mmol/L _0 Calcium 8.9 - 10.3 mg/dL 9.1   9.1   8.8    Total Protein 6.5 - 8.1 g/dL 6.8    6.5    Total Bilirubin 0.3 - 1.2 mg/dL 1.1    1.0    Alkaline Phos 38 - 126 U/L 46    54    AST 15 - 41 U/L 18    17    ALT 0 - 44 U/L 19    23       Assessment and plan   1. Normocytic anemia   2. Autoimmune hemolytic anemia (HCC)   3. Hepatitis C antibody positive in blood   4. Thrombocytopenia ([place])   5. Stage 3b chronic kidney disease ([place])   6. MGUS (monoclonal gammopathy of unknown significance)    #History of recurrent warm antibody hemolytic anemia, Labs reviewed and discussed with 12/05/2021 decrease of hemoglobin to 9.9.  MCV 95.2.  Platelet count 120. Additional blood work was obtained on 12/08/2021. LDH is normal, adequate B12 and folate level, normal iron panel.  There is increased reticulocyte %-5%.  Cold agglutinin titer negative. Bilirubin is  normal. 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  Currently patient has persistent anemia with hemoglobin 9.4.  This could be secondary to recent infection/cellulitis or prednisone refractory hemolysis. I would not further taper down her prednisone.  Recommend her to continue prednisone 10 mg daily. Recommend IVIG rationale and potential side effects reviewed and discussed with patient.  She agrees with the plan. Other options will be rechallenging with rituximab which she previously tolerated.  However due to hepatitis, she will need to be started on antivirals again.  Shared decision was made to defer rituximab option and to she really needs it. [name] Kitchen   #Chronic kidney disease, stage III.  Encourage oral hydration.  Avoid  nephrotoxins.   Previous multiple myeloma showed M protein of 0.2, IgA kappa light chain recommend observation.  #Positive hepatitis C antibody, history of hepatitis B previously on entecavir after her previous rituximab treatments.  # Right middle lobe nodule, CT scan was reviewed.  PET negative.   #Thrombocytopenia, etiology unknown.  Thrombocytopenia improved likely reactive increase due to cellulitis #History of discoid lupus, ANA is chronically positive. #Left lower extremity cellulitis, finish Keflex course continue topical antibiotic cream.  Follow up  Follow-up in 3 weeks.  I discussed the assessment and treatment plan with the patient.  The patient was provided an opportunity to ask questions and all were answered.  The patient agreed with the plan and demonstrated an understanding of the instructions.  The patient was advised to call back or seek an in person evaluation if the symptoms worsen or if the condition fails to improve as anticipated.   Earlie Server, MD, PhD  02/02/2022

## 2022-02-03 LAB — HAPTOGLOBIN: Haptoglobin: <10 mg/dL — ABNORMAL LOW (ref 37–355)

## 2022-02-03 LAB — HCV RNA QUANT RFLX ULTRA OR GENOTYP
HCV RNA Qnt(log copy/mL): UNDETERMINED log10 IU/mL
HepC Qn: NOT DETECTED IU/mL

## 2022-02-23 ENCOUNTER — Inpatient Hospital Stay: Payer: BC Managed Care – PPO

## 2022-02-23 ENCOUNTER — Encounter: Payer: Self-pay | Admitting: Oncology

## 2022-02-23 ENCOUNTER — Inpatient Hospital Stay: Payer: BC Managed Care – PPO | Admitting: Oncology

## 2022-02-23 VITALS — BP 127/68 | HR 78 | Temp 96.0°F | Resp 16

## 2022-02-23 VITALS — BP 126/59 | HR 79 | Temp 96.8°F | Resp 16 | Ht 63.0 in | Wt 177.5 lb

## 2022-02-23 DIAGNOSIS — R768 Other specified abnormal immunological findings in serum: Secondary | ICD-10-CM

## 2022-02-23 DIAGNOSIS — Z872 Personal history of diseases of the skin and subcutaneous tissue: Secondary | ICD-10-CM | POA: Insufficient documentation

## 2022-02-23 DIAGNOSIS — N1831 Chronic kidney disease, stage 3a: Secondary | ICD-10-CM | POA: Diagnosis not present

## 2022-02-23 DIAGNOSIS — D649 Anemia, unspecified: Secondary | ICD-10-CM

## 2022-02-23 DIAGNOSIS — D72819 Decreased white blood cell count, unspecified: Secondary | ICD-10-CM | POA: Diagnosis not present

## 2022-02-23 DIAGNOSIS — D5911 Warm autoimmune hemolytic anemia: Secondary | ICD-10-CM | POA: Diagnosis not present

## 2022-02-23 DIAGNOSIS — D591 Autoimmune hemolytic anemia, unspecified: Secondary | ICD-10-CM

## 2022-02-23 DIAGNOSIS — D696 Thrombocytopenia, unspecified: Secondary | ICD-10-CM

## 2022-02-23 LAB — COMPREHENSIVE METABOLIC PANEL
ALT: 16 U/L (ref 0–44)
AST: 17 U/L (ref 15–41)
Albumin: 3.8 g/dL (ref 3.5–5.0)
Alkaline Phosphatase: 47 U/L (ref 38–126)
Anion gap: 5 (ref 5–15)
BUN: 31 mg/dL — ABNORMAL HIGH (ref 8–23)
CO2: 32 mmol/L (ref 22–32)
Calcium: 9.2 mg/dL (ref 8.9–10.3)
Chloride: 106 mmol/L (ref 98–111)
Creatinine, Ser: 1.34 mg/dL — ABNORMAL HIGH (ref 0.44–1.00)
GFR, Estimated: 44 mL/min — ABNORMAL LOW (ref >60)
Glucose, Bld: 111 mg/dL — ABNORMAL HIGH (ref 70–99)
Potassium: 3.7 mmol/L (ref 3.5–5.1)
Sodium: 143 mmol/L (ref 135–145)
Total Bilirubin: 0.8 mg/dL (ref 0.3–1.2)
Total Protein: 6.4 g/dL — ABNORMAL LOW (ref 6.5–8.1)

## 2022-02-23 LAB — CBC WITH DIFFERENTIAL/PLATELET
Abs Immature Granulocytes: 0.05 10*3/uL (ref 0.00–0.07)
Basophils Absolute: 0.1 10*3/uL (ref 0.0–0.1)
Basophils Relative: 1 %
Eosinophils Absolute: 0 10*3/uL (ref 0.0–0.5)
Eosinophils Relative: 1 %
HCT: 29.4 % — ABNORMAL LOW (ref 36.0–46.0)
Hemoglobin: 9.1 g/dL — ABNORMAL LOW (ref 12.0–15.0)
Immature Granulocytes: 1 %
Lymphocytes Relative: 8 %
Lymphs Abs: 0.6 10*3/uL — ABNORMAL LOW (ref 0.7–4.0)
MCH: 32.4 pg (ref 26.0–34.0)
MCHC: 31 g/dL (ref 30.0–36.0)
MCV: 104.6 fL — ABNORMAL HIGH (ref 80.0–100.0)
Monocytes Absolute: 0.3 10*3/uL (ref 0.1–1.0)
Monocytes Relative: 4 %
Neutro Abs: 6.4 10*3/uL (ref 1.7–7.7)
Neutrophils Relative %: 85 %
Platelets: 129 10*3/uL — ABNORMAL LOW (ref 150–400)
RBC: 2.81 MIL/uL — ABNORMAL LOW (ref 3.87–5.11)
RDW: 16.2 % — ABNORMAL HIGH (ref 11.5–15.5)
WBC: 7.5 10*3/uL (ref 4.0–10.5)
nRBC: 0 % (ref 0.0–0.2)

## 2022-02-23 LAB — LACTATE DEHYDROGENASE: LDH: 260 U/L — ABNORMAL HIGH (ref 98–192)

## 2022-02-23 MED ORDER — IMMUNE GLOBULIN (HUMAN) 40 GM/400ML IV SOLN
60.0000 g | Freq: Once | INTRAVENOUS | Status: AC
  Administered 2022-02-23: 60 g via INTRAVENOUS
  Filled 2022-02-23: qty 400

## 2022-02-23 MED ORDER — DIPHENHYDRAMINE HCL 25 MG PO CAPS
25.0000 mg | ORAL_CAPSULE | Freq: Once | ORAL | Status: AC
  Administered 2022-02-23: 25 mg via ORAL
  Filled 2022-02-23: qty 1

## 2022-02-23 MED ORDER — ACETAMINOPHEN 325 MG PO TABS
650.0000 mg | ORAL_TABLET | Freq: Once | ORAL | Status: AC
  Administered 2022-02-23: 650 mg via ORAL
  Filled 2022-02-23: qty 2

## 2022-02-23 MED ORDER — DEXTROSE 5 % IV SOLN
Freq: Once | INTRAVENOUS | Status: AC
  Filled 2022-02-23: qty 250

## 2022-02-23 NOTE — Assessment & Plan Note (Signed)
Positive hepatitis C antibody, history of hepatitis B previously on entecavir after her previous rituximab treatments.

## 2022-02-24 LAB — HAPTOGLOBIN: Haptoglobin: <10 mg/dL — ABNORMAL LOW (ref 37–355)

## 2022-02-25 ENCOUNTER — Telehealth: Payer: Self-pay | Admitting: *Deleted

## 2022-02-25 ENCOUNTER — Other Ambulatory Visit: Payer: Self-pay | Admitting: Oncology

## 2022-02-25 MED ORDER — PROCHLORPERAZINE MALEATE 10 MG PO TABS: 10.0000 mg | ORAL_TABLET | Freq: Four times a day (QID) | ORAL | 0 refills | Status: DC | PRN

## 2022-02-25 NOTE — Telephone Encounter (Signed)
Per Dr. Tasia Catchings, is sending compazine to pharmacy to help with nausea and can help with headache. Please inform patient that if she doesn't get any relief with medication to call us back and we can offer her [facility].

## 2022-02-25 NOTE — Telephone Encounter (Signed)
Patient called answering service reporting that she has an extreme headache and nausea since yesterday, the nausea is off and on she has tried Tylenol, and  a dark room for headache with no relief.he states she has had migraines in past but never lasted more than a day.  She had her IVIG on 02/23/22 Please advise.

## 2022-02-25 NOTE — Telephone Encounter (Signed)
Patient informed of prescription sent and to call back if no relief in next day or 2 and we will get her in with [facility]. Patient thanked me

## 2022-02-27 ENCOUNTER — Inpatient Hospital Stay: Payer: BC Managed Care – PPO

## 2022-02-27 ENCOUNTER — Other Ambulatory Visit: Payer: Self-pay | Admitting: Oncology

## 2022-02-27 VITALS — BP 106/72 | HR 66 | Temp 96.4°F | Resp 18

## 2022-02-27 DIAGNOSIS — D5911 Warm autoimmune hemolytic anemia: Secondary | ICD-10-CM | POA: Diagnosis not present

## 2022-02-27 DIAGNOSIS — D591 Autoimmune hemolytic anemia, unspecified: Secondary | ICD-10-CM

## 2022-02-27 MED ORDER — IMMUNE GLOBULIN (HUMAN) 5 GM/50ML IV SOLN
65.0000 g | Freq: Once | INTRAVENOUS | Status: AC
  Administered 2022-02-27: 65 g via INTRAVENOUS
  Filled 2022-02-27: qty 400

## 2022-02-27 MED ORDER — ACETAMINOPHEN 325 MG PO TABS
650.0000 mg | ORAL_TABLET | Freq: Four times a day (QID) | ORAL | Status: DC | PRN
  Administered 2022-02-27: 650 mg via ORAL
  Filled 2022-02-27: qty 2

## 2022-02-27 MED ORDER — DEXTROSE 5 % IV SOLN
INTRAVENOUS | Status: DC
  Filled 2022-02-27: qty 250

## 2022-02-27 MED ORDER — DIPHENHYDRAMINE HCL 25 MG PO CAPS
25.0000 mg | ORAL_CAPSULE | Freq: Four times a day (QID) | ORAL | Status: DC | PRN
  Administered 2022-02-27: 25 mg via ORAL
  Filled 2022-02-27: qty 1

## 2022-02-27 NOTE — Patient Instructions (Signed)
[facility] CANCER CTR AT [place]  Discharge Instructions: Thank you for choosing [facility] to provide your oncology and hematology care.  If you have a lab appointment with the [facility], please go directly to the [facility] and check in at the registration area.  Wear comfortable clothing and clothing appropriate for easy access to any Portacath or PICC line.   We strive to give you quality time with your provider. You may need to reschedule your appointment if you arrive late (15 or more minutes).  Arriving late affects you and other patients whose appointments are after yours.  Also, if you miss three or more appointments without notifying the office, you may be dismissed from the clinic at the provider's discretion.      For prescription refill requests, have your pharmacy contact our office and allow 72 hours for refills to be completed.    Today you received the following chemotherapy and/or immunotherapy agents IVIG       To help prevent nausea and vomiting after your treatment, we encourage you to take your nausea medication as directed.  BELOW ARE SYMPTOMS THAT SHOULD BE REPORTED IMMEDIATELY: *FEVER GREATER THAN 100.4 F (38 C) OR HIGHER *CHILLS OR SWEATING *NAUSEA AND VOMITING THAT IS NOT CONTROLLED WITH YOUR NAUSEA MEDICATION *UNUSUAL SHORTNESS OF BREATH *UNUSUAL BRUISING OR BLEEDING *URINARY PROBLEMS (pain or burning when urinating, or frequent urination) *BOWEL PROBLEMS (unusual diarrhea, constipation, pain near the anus) TENDERNESS IN MOUTH AND THROAT WITH OR WITHOUT PRESENCE OF ULCERS (sore throat, sores in mouth, or a toothache) UNUSUAL RASH, SWELLING OR PAIN  UNUSUAL VAGINAL DISCHARGE OR ITCHING   Items with * indicate a potential emergency and should be followed up as soon as possible or go to the Emergency Department if any problems should occur.  Please show the CHEMOTHERAPY ALERT CARD or IMMUNOTHERAPY ALERT CARD at check-in to the  Emergency Department and triage nurse.  Should you have questions after your visit or need to cancel or reschedule your appointment, please contact [facility] CANCER [place] AT [place]  [phone] and follow the prompts.  Office hours are 8:00 a.m. to 4:30 p.m. Monday - Friday. Please note that voicemails left after 4:00 p.m. may not be returned until the following business day.  We are closed weekends and major holidays. You have access to a nurse at all times for urgent questions. Please call the main number to the clinic [phone] and follow the prompts.  For any non-urgent questions, you may also contact your provider using MyChart. We now offer e-Visits for anyone 69 and older to request care online for non-urgent symptoms. For details visit [email or web address].   Also download the MyChart app! Go to the app store, search "MyChart", open the app, select [facility], and log in with your MyChart username and password.  Masks are optional in the cancer centers. If you would like for your care team to wear a mask while they are taking care of you, please let them know. For doctor visits, patients may have with them one support person who is at least 64 years old. At this time, visitors are not allowed in the infusion area.  Immune Globulin Injection What is this medication? IMMUNE GLOBULIN (im MUNE  GLOB yoo lin) helps to prevent or reduce the severity of certain infections in patients who are at risk. This medicine is collected from the pooled blood of many donors. It is used to treat immune system problems, thrombocytopenia, and Kawasaki syndrome. This  medicine may be used for other purposes; ask your health care provider or pharmacist if you have questions. COMMON BRAND NAME(S): ASCENIV, Baygam, BIVIGAM, Carimune, Carimune NF, cutaquig, Cuvitru, Flebogamma, Flebogamma DIF, GamaSTAN, GamaSTAN S/D, Gamimune N, Gammagard, Gammagard S/D, Gammaked, Gammaplex, Gammar-P IV, Gamunex,  Gamunex-C, Hizentra, Iveegam, Iveegam EN, Octagam, Panglobulin, Panglobulin NF, panzyga, Polygam S/D, Privigen, Sandoglobulin, Venoglobulin-S, Vigam, Vivaglobulin, Xembify What should I tell my care team before I take this medication? They need to know if you have any of these conditions: diabetes extremely low or no immune antibodies in the blood heart disease history of blood clots hyperprolinemia infection in the blood, sepsis kidney disease recently received or scheduled to receive a vaccination an unusual or allergic reaction to human immune globulin, albumin, maltose, sucrose, other medicines, foods, dyes, or preservatives pregnant or trying to get pregnant breast-feeding How should I use this medication? This medicine is for injection into a muscle or infusion into a vein or skin. It is usually given by a health care professional in a hospital or clinic setting. In rare cases, some brands of this medicine might be given at home. You will be taught how to give this medicine. Use exactly as directed. Take your medicine at regular intervals. Do not take your medicine more often than directed. Talk to your pediatrician regarding the use of this medicine in children. While this drug may be prescribed for selected conditions, precautions do apply. Overdosage: If you think you have taken too much of this medicine contact a poison control center or emergency room at once. NOTE: This medicine is only for you. Do not share this medicine with others. What if I miss a dose? It is important not to miss your dose. Call your doctor or health care professional if you are unable to keep an appointment. If you give yourself the medicine and you miss a dose, take it as soon as you can. If it is almost time for your next dose, take only that dose. Do not take double or extra doses. What may interact with this medication? aspirin and aspirin-like medicines cisplatin cyclosporine medicines for infection  like acyclovir, adefovir, amphotericin B, bacitracin, cidofovir, foscarnet, ganciclovir, gentamicin, pentamidine, vancomycin NSAIDS, medicines for pain and inflammation, like ibuprofen or naproxen pamidronate vaccines zoledronic acid This list may not describe all possible interactions. Give your health care provider a list of all the medicines, herbs, non-prescription drugs, or dietary supplements you use. Also tell them if you smoke, drink alcohol, or use illegal drugs. Some items may interact with your medicine. What should I watch for while using this medication? Your condition will be monitored carefully while you are receiving this medicine. This medicine is made from pooled blood donations of many different people. It may be possible to pass an infection in this medicine. However, the donors are screened for infections and all products are tested for HIV and hepatitis. The medicine is treated to kill most or all bacteria and viruses. Talk to your doctor about the risks and benefits of this medicine. Do not have vaccinations for at least 14 days before, or until at least 3 months after receiving this medicine. What side effects may I notice from receiving this medication? Side effects that you should report to your doctor or health care professional as soon as possible: allergic reactions like skin rash, itching or hives, swelling of the face, lips, or tongue blue colored lips or skin breathing problems chest pain or tightness fever signs and symptoms of aseptic meningitis  such as stiff neck; sensitivity to light; headache; drowsiness; fever; nausea; vomiting; rash signs and symptoms of a blood clot such as chest pain; shortness of breath; pain, swelling, or warmth in the leg signs and symptoms of hemolytic anemia such as fast heartbeat; tiredness; dark yellow or brown urine; or yellowing of the eyes or skin signs and symptoms of kidney injury like trouble passing urine or change in the  amount of urine sudden weight gain swelling of the ankles, feet, hands Side effects that usually do not require medical attention (report to your doctor or health care professional if they continue or are bothersome): diarrhea flushing headache increased sweating joint pain muscle cramps muscle pain nausea pain, redness, or irritation at site where injected tiredness This list may not describe all possible side effects. Call your doctor for medical advice about side effects. You may report side effects to FDA at 1-800-FDA-1088. Where should I keep my medication? Keep out of the reach of children. This drug is usually given in a hospital or clinic and will not be stored at home. In rare cases, some brands of this medicine may be given at home. If you are using this medicine at home, you will be instructed on how to store this medicine. Throw away any unused medicine after the expiration date on the label. NOTE: This sheet is a summary. It may not cover all possible information. If you have questions about this medicine, talk to your doctor, pharmacist, or health care provider.  2023 Elsevier/Gold Standard (2019-03-22 00:00:00)

## 2022-03-02 ENCOUNTER — Inpatient Hospital Stay: Payer: BC Managed Care – PPO | Attending: Oncology

## 2022-03-02 DIAGNOSIS — D72819 Decreased white blood cell count, unspecified: Secondary | ICD-10-CM

## 2022-03-02 DIAGNOSIS — B1920 Unspecified viral hepatitis C without hepatic coma: Secondary | ICD-10-CM | POA: Insufficient documentation

## 2022-03-02 DIAGNOSIS — R918 Other nonspecific abnormal finding of lung field: Secondary | ICD-10-CM | POA: Insufficient documentation

## 2022-03-02 DIAGNOSIS — D696 Thrombocytopenia, unspecified: Secondary | ICD-10-CM | POA: Diagnosis not present

## 2022-03-02 DIAGNOSIS — R7989 Other specified abnormal findings of blood chemistry: Secondary | ICD-10-CM | POA: Insufficient documentation

## 2022-03-02 DIAGNOSIS — N1832 Chronic kidney disease, stage 3b: Secondary | ICD-10-CM | POA: Insufficient documentation

## 2022-03-02 DIAGNOSIS — D472 Monoclonal gammopathy: Secondary | ICD-10-CM | POA: Insufficient documentation

## 2022-03-02 DIAGNOSIS — Z87891 Personal history of nicotine dependence: Secondary | ICD-10-CM | POA: Diagnosis not present

## 2022-03-02 DIAGNOSIS — D631 Anemia in chronic kidney disease: Secondary | ICD-10-CM | POA: Insufficient documentation

## 2022-03-02 DIAGNOSIS — D5911 Warm autoimmune hemolytic anemia: Secondary | ICD-10-CM | POA: Insufficient documentation

## 2022-03-02 LAB — COMPREHENSIVE METABOLIC PANEL
ALT: 17 U/L (ref 0–44)
AST: 22 U/L (ref 15–41)
Albumin: 3.8 g/dL (ref 3.5–5.0)
Alkaline Phosphatase: 38 U/L (ref 38–126)
Anion gap: 7 (ref 5–15)
BUN: 36 mg/dL — ABNORMAL HIGH (ref 8–23)
CO2: 29 mmol/L (ref 22–32)
Calcium: 8.8 mg/dL — ABNORMAL LOW (ref 8.9–10.3)
Chloride: 103 mmol/L (ref 98–111)
Creatinine, Ser: 1.35 mg/dL — ABNORMAL HIGH (ref 0.44–1.00)
GFR, Estimated: 44 mL/min — ABNORMAL LOW (ref >60)
Glucose, Bld: 92 mg/dL (ref 70–99)
Potassium: 3.4 mmol/L — ABNORMAL LOW (ref 3.5–5.1)
Sodium: 139 mmol/L (ref 135–145)
Total Bilirubin: 0.6 mg/dL (ref 0.3–1.2)
Total Protein: 8.2 g/dL — ABNORMAL HIGH (ref 6.5–8.1)

## 2022-03-02 LAB — LACTATE DEHYDROGENASE: LDH: 262 U/L — ABNORMAL HIGH (ref 98–192)

## 2022-03-02 LAB — CBC WITH DIFFERENTIAL/PLATELET
Abs Immature Granulocytes: 0.02 10*3/uL (ref 0.00–0.07)
Basophils Absolute: 0 10*3/uL (ref 0.0–0.1)
Basophils Relative: 1 %
Eosinophils Absolute: 0 10*3/uL (ref 0.0–0.5)
Eosinophils Relative: 1 %
HCT: 28.8 % — ABNORMAL LOW (ref 36.0–46.0)
Hemoglobin: 8.9 g/dL — ABNORMAL LOW (ref 12.0–15.0)
Immature Granulocytes: 1 %
Lymphocytes Relative: 26 %
Lymphs Abs: 1.2 10*3/uL (ref 0.7–4.0)
MCH: 32 pg (ref 26.0–34.0)
MCHC: 30.9 g/dL (ref 30.0–36.0)
MCV: 103.6 fL — ABNORMAL HIGH (ref 80.0–100.0)
Monocytes Absolute: 0.4 10*3/uL (ref 0.1–1.0)
Monocytes Relative: 8 %
Neutro Abs: 2.8 10*3/uL (ref 1.7–7.7)
Neutrophils Relative %: 63 %
Platelets: 118 10*3/uL — ABNORMAL LOW (ref 150–400)
RBC: 2.78 MIL/uL — ABNORMAL LOW (ref 3.87–5.11)
RDW: 15.1 % (ref 11.5–15.5)
WBC: 4.4 10*3/uL (ref 4.0–10.5)
nRBC: 0 % (ref 0.0–0.2)

## 2022-03-03 LAB — HAPTOGLOBIN: Haptoglobin: 34 mg/dL — ABNORMAL LOW (ref 37–355)

## 2022-03-04 ENCOUNTER — Telehealth: Payer: Self-pay

## 2022-03-04 NOTE — Telephone Encounter (Signed)
-----   Message from Earlie Server, MD sent at 03/02/2022 11:48 PM EDT ----- Please add lab encounter in 10 days, cbc ldh, haptoglobin

## 2022-03-04 NOTE — Telephone Encounter (Signed)
Orders entered. Please schedule pt for labs in 10 days and inform pt of appt.

## 2022-03-05 ENCOUNTER — Telehealth: Payer: Self-pay | Admitting: Acute Care

## 2022-03-05 ENCOUNTER — Ambulatory Visit
Admission: RE | Admit: 2022-03-05 | Discharge: 2022-03-05 | Disposition: A | Discharge status: Home / Self Care | Payer: BC Managed Care – PPO | Source: Ambulatory Visit | Attending: Acute Care | Admitting: Acute Care

## 2022-03-05 DIAGNOSIS — R911 Solitary pulmonary nodule: Secondary | ICD-10-CM | POA: Diagnosis present

## 2022-03-05 DIAGNOSIS — Z87891 Personal history of nicotine dependence: Secondary | ICD-10-CM | POA: Diagnosis present

## 2022-03-05 NOTE — Telephone Encounter (Signed)
Received a call report from [facility] for patient's lung cancer screening CT. Radiologist is concerned about the first impression. Below is a copy of the impressions:   "IMPRESSION: 1. Lung-RADS 3, probably benign findings. New 5.9 mm right lower lobe nodule. Short-term follow-up in 6 months is recommended with repeat low-dose chest CT without contrast (please use the following order, "CT CHEST LCS NODULE FOLLOW-UP W/O CM"). 2. Interval decrease in size of the 14.4 mm lateral segment right middle lobe nodule of concern on prior screening CT. 3. Aortic Atherosclerosis (ICD10-I70.0) and Emphysema (ICD10-J43.9)."  Will forward to Judson Roch and the lung nodule pool.

## 2022-03-05 NOTE — Telephone Encounter (Signed)
Ct results faxed to PCP with f/u plans included.

## 2022-03-05 NOTE — Telephone Encounter (Signed)
Tina Page returned my call. I explained that her 3 month follow up of a 14.4 mm nodule shows that nodule has shrunk from 14.4 mm to 5.6 mm which is more consistent with a resolving infection.  I also explained that there was a new 5.9 mm nodule , and recommendation is for a 6 month follow up to assess that nodule again. She denies being sick. She has had to start prednisone for wheezing.  She is in agreement with a 6 month follow up. [name], I have ordered the scan. She will be due beginning of January.  Please fax PCP and let her know. Thanks so much

## 2022-03-05 NOTE — Telephone Encounter (Signed)
I have attempted to call the patient with the results of their  Low Dose CT Chest Lung cancer screening scan. There was no answer. I have left a HIPPA compliant VM requesting the patient call the office for the scan results. I included the office contact information in the message. We will await Tina Page's  return call. If no return call we will continue to call until patient is contacted.

## 2022-03-06 ENCOUNTER — Other Ambulatory Visit: Payer: Self-pay | Admitting: *Deleted

## 2022-03-06 DIAGNOSIS — Z87891 Personal history of nicotine dependence: Secondary | ICD-10-CM

## 2022-03-06 DIAGNOSIS — R911 Solitary pulmonary nodule: Secondary | ICD-10-CM

## 2022-03-12 ENCOUNTER — Other Ambulatory Visit: Payer: BC Managed Care – PPO

## 2022-03-13 ENCOUNTER — Telehealth: Payer: Self-pay | Admitting: Oncology

## 2022-03-13 ENCOUNTER — Inpatient Hospital Stay: Payer: BC Managed Care – PPO

## 2022-03-13 DIAGNOSIS — D72819 Decreased white blood cell count, unspecified: Secondary | ICD-10-CM

## 2022-03-13 DIAGNOSIS — D5911 Warm autoimmune hemolytic anemia: Secondary | ICD-10-CM | POA: Diagnosis not present

## 2022-03-13 LAB — CBC WITH DIFFERENTIAL/PLATELET
Abs Immature Granulocytes: 0.01 10*3/uL (ref 0.00–0.07)
Basophils Absolute: 0 10*3/uL (ref 0.0–0.1)
Basophils Relative: 1 %
Eosinophils Absolute: 0.1 10*3/uL (ref 0.0–0.5)
Eosinophils Relative: 2 %
HCT: 32.1 % — ABNORMAL LOW (ref 36.0–46.0)
Hemoglobin: 10.3 g/dL — ABNORMAL LOW (ref 12.0–15.0)
Immature Granulocytes: 0 %
Lymphocytes Relative: 27 %
Lymphs Abs: 1 10*3/uL (ref 0.7–4.0)
MCH: 32.3 pg (ref 26.0–34.0)
MCHC: 32.1 g/dL (ref 30.0–36.0)
MCV: 100.6 fL — ABNORMAL HIGH (ref 80.0–100.0)
Monocytes Absolute: 0.3 10*3/uL (ref 0.1–1.0)
Monocytes Relative: 7 %
Neutro Abs: 2.4 10*3/uL (ref 1.7–7.7)
Neutrophils Relative %: 63 %
Platelets: 93 10*3/uL — ABNORMAL LOW (ref 150–400)
RBC: 3.19 MIL/uL — ABNORMAL LOW (ref 3.87–5.11)
RDW: 14.8 % (ref 11.5–15.5)
WBC: 3.8 10*3/uL — ABNORMAL LOW (ref 4.0–10.5)
nRBC: 0 % (ref 0.0–0.2)

## 2022-03-13 LAB — LACTATE DEHYDROGENASE: LDH: 240 U/L — ABNORMAL HIGH (ref 98–192)

## 2022-03-13 NOTE — Telephone Encounter (Signed)
LVm for pt to give [name] a call to r/s appt to a later date .[name] KitchenKJ

## 2022-03-14 LAB — HAPTOGLOBIN: Haptoglobin: <10 mg/dL — ABNORMAL LOW (ref 37–355)

## 2022-03-23 ENCOUNTER — Other Ambulatory Visit: Payer: BC Managed Care – PPO

## 2022-03-23 ENCOUNTER — Ambulatory Visit: Payer: BC Managed Care – PPO | Admitting: Oncology

## 2022-03-30 ENCOUNTER — Other Ambulatory Visit: Payer: Self-pay

## 2022-03-30 ENCOUNTER — Encounter: Payer: Self-pay | Admitting: Oncology

## 2022-03-30 ENCOUNTER — Inpatient Hospital Stay: Payer: BC Managed Care – PPO

## 2022-03-30 ENCOUNTER — Inpatient Hospital Stay: Payer: BC Managed Care – PPO | Admitting: Oncology

## 2022-03-30 VITALS — BP 147/63 | HR 68 | Temp 96.3°F | Ht 63.0 in | Wt 177.0 lb

## 2022-03-30 DIAGNOSIS — D631 Anemia in chronic kidney disease: Secondary | ICD-10-CM

## 2022-03-30 DIAGNOSIS — N189 Chronic kidney disease, unspecified: Secondary | ICD-10-CM | POA: Insufficient documentation

## 2022-03-30 DIAGNOSIS — N1832 Chronic kidney disease, stage 3b: Secondary | ICD-10-CM

## 2022-03-30 DIAGNOSIS — N1831 Chronic kidney disease, stage 3a: Secondary | ICD-10-CM

## 2022-03-30 DIAGNOSIS — D591 Autoimmune hemolytic anemia, unspecified: Secondary | ICD-10-CM

## 2022-03-30 DIAGNOSIS — D5912 Cold autoimmune hemolytic anemia: Secondary | ICD-10-CM

## 2022-03-30 DIAGNOSIS — D696 Thrombocytopenia, unspecified: Secondary | ICD-10-CM

## 2022-03-30 DIAGNOSIS — D5911 Warm autoimmune hemolytic anemia: Secondary | ICD-10-CM | POA: Diagnosis not present

## 2022-03-30 LAB — CBC WITH DIFFERENTIAL/PLATELET
Abs Immature Granulocytes: 0.03 10*3/uL (ref 0.00–0.07)
Basophils Absolute: 0.1 10*3/uL (ref 0.0–0.1)
Basophils Relative: 1 %
Eosinophils Absolute: 0.1 10*3/uL (ref 0.0–0.5)
Eosinophils Relative: 1 %
HCT: 30.3 % — ABNORMAL LOW (ref 36.0–46.0)
Hemoglobin: 9.5 g/dL — ABNORMAL LOW (ref 12.0–15.0)
Immature Granulocytes: 1 %
Lymphocytes Relative: 10 %
Lymphs Abs: 0.6 10*3/uL — ABNORMAL LOW (ref 0.7–4.0)
MCH: 32.4 pg (ref 26.0–34.0)
MCHC: 31.4 g/dL (ref 30.0–36.0)
MCV: 103.4 fL — ABNORMAL HIGH (ref 80.0–100.0)
Monocytes Absolute: 0.3 10*3/uL (ref 0.1–1.0)
Monocytes Relative: 4 %
Neutro Abs: 5.6 10*3/uL (ref 1.7–7.7)
Neutrophils Relative %: 83 %
Platelets: 127 10*3/uL — ABNORMAL LOW (ref 150–400)
RBC: 2.93 MIL/uL — ABNORMAL LOW (ref 3.87–5.11)
RDW: 15.6 % — ABNORMAL HIGH (ref 11.5–15.5)
WBC: 6.6 10*3/uL (ref 4.0–10.5)
nRBC: 0 % (ref 0.0–0.2)

## 2022-03-30 LAB — IRON AND TIBC
Iron: 66 ug/dL (ref 28–170)
Saturation Ratios: 20 % (ref 10.4–31.8)
TIBC: 323 ug/dL (ref 250–450)
UIBC: 257 ug/dL

## 2022-03-30 LAB — FERRITIN: Ferritin: 407 ng/mL — ABNORMAL HIGH (ref 11–307)

## 2022-03-30 LAB — COMPREHENSIVE METABOLIC PANEL
ALT: 15 U/L (ref 0–44)
AST: 19 U/L (ref 15–41)
Albumin: 4.1 g/dL (ref 3.5–5.0)
Alkaline Phosphatase: 42 U/L (ref 38–126)
Anion gap: 7 (ref 5–15)
BUN: 38 mg/dL — ABNORMAL HIGH (ref 8–23)
CO2: 31 mmol/L (ref 22–32)
Calcium: 9 mg/dL (ref 8.9–10.3)
Chloride: 103 mmol/L (ref 98–111)
Creatinine, Ser: 1.29 mg/dL — ABNORMAL HIGH (ref 0.44–1.00)
GFR, Estimated: 46 mL/min — ABNORMAL LOW (ref >60)
Glucose, Bld: 101 mg/dL — ABNORMAL HIGH (ref 70–99)
Potassium: 4.2 mmol/L (ref 3.5–5.1)
Sodium: 141 mmol/L (ref 135–145)
Total Bilirubin: 0.9 mg/dL (ref 0.3–1.2)
Total Protein: 7.1 g/dL (ref 6.5–8.1)

## 2022-03-30 LAB — LACTATE DEHYDROGENASE: LDH: 239 U/L — ABNORMAL HIGH (ref 98–192)

## 2022-03-30 MED ORDER — PREDNISONE 5 MG PO TABS: 5.0000 mg | ORAL_TABLET | Freq: Every day | ORAL | 0 refills | Status: DC

## 2022-03-30 NOTE — Progress Notes (Signed)
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  MCV 95.2.  Platelet count 120. Additional blood work was obtained on 12/08/2021. LDH is normal, adequate B12 and folate level, normal iron panel.  There is increased reticulocyte %-5%.  Cold agglutinin titer negative. Bilirubin is normal. 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  Decrease prednisone to '5mg'$  daily.  Previously tolerated IVIG '1mg'$ /kg x 2 doses. She responded transiently.  I discussed with her about option of re-challenge with Rituximab, however she will need to take Entecavir. Patient has CKD, and has she is not enthusiastic about starting Rituximab.  We discussed about repeat IVIG and also treat anemia of CKD.  She agrees.    Stage 3a chronic kidney disease (CKD) (HCC) Encourage oral hydration.  Avoid nephrotoxins.   Thrombocytopenia (HCC) Stable. Continue to monitor  Anemia in chronic kidney disease (CKD) Iron panel showed adequate ferritin level.  Recommend procrit.  Rationale and side effects were reviewed and discussed patient.  She agrees with treatment.   Orders Placed This Encounter  Procedures   Iron and TIBC    Standing Status:   Future    Number of Occurrences:   1    Standing Expiration Date:   03/31/2023   Ferritin    Standing Status:   Future    Number of Occurrences:   1    Standing Expiration Date:   03/31/2023   Follow up  IVIG daily x 2 dose.  Procrit x 1 Lab MD 5 weeks [same labs] + procrit All questions were answered. The patient knows to call the clinic with any problems, questions or concerns.  Earlie Server, MD, PhD [facility] Hematology Oncology 03/30/2022   Chief Complaint: Tina Page is a  66 y.o. 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  Reticulocyte count was 11.3%.  Ferritin was 562.  Iron studies revealed a saturation of 20% and a TIBC of 214 (low).  B12 was 231 (low normal).  Folate was 42.   Peripheral smear revealed rouleaux formation.    Additional testing included the following + studies:  hepatitis C antibody, hepatitis B core antibody, CMV IgM, and EBV VCA (IgM and IgG).  Hepatitis B by PCR was negative.  Hepatitis C RNA was negative.  Mycoplasma pneumonia IgM was negative.  Reticulocyte count was 11.3% (high) indicating appropriate marrow response.  C3 and C4 were normal.  Cold agglutinin titer was negative x 2.  Negative studies included:  ANA, hepatitis B surface antigen, SPEP, and free light chain ratio.   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  MMA was initially 330 (normal).  Repeat MMA was elevated on 08/01/2016 confirming B12 deficiency.  Hepatitis B surface antibody was positive and hepatitis B surface antigen was negative on 08/06/2016.   07/07/2016  Chest, abdomen, and pelvis CT angiogram on revealed moderate diffuse atherosclerotic vascular disease of the abdominal aorta with severe stenosis of the left common iliac artery with  suspected short segment occlusion. There was no adenopathy.  Spleen was normal.   04/15/2017 Abd US revealed a normal spleen and sludge in the gallbladder.  The liver was echogenic consistent fatty infiltration and/or hepatocellular disease.  For hemolytic anemia treatments, in 2017, She has received multiple units of  warmed PRBCs to date.  08/01/2016-03/12/2017, steroid  treatment with prednisone.  She is also on folic acid 1 mg daily. Borderline low vitamin B12 level on 07/09/2016.  Patient has been on oral vitamin B12 supplementation.  10/12/2017  positive warm autoantibody.  LDH and bilirubin were normal.   She began prednisone 10 mg on 12/07/2017 (discontinued 03/2018).  She received Rituxan weekly x 4 (last dose 06/16/2018).  Entecavir was discontinued on 07/06/2019.  06/12/2020 developed recurrent anemia.  Hematocrit was 27.6, hemoglobin 8.4, MCV 104.5, platelets 138,000, WBC 3,400 (ANC 2,300). Creatinine was 1.17 (CrCl 50 ml/min).  Positive warm autoantibody  Reticulocyte count was 7.1%.  LDH was 204 (98-192).  06/16/2020, began prednisone 1 mg/kilogram treatments, 07/24/2020, prednisone was tapered down to 20 mg.  She received Rituxan 1000 mg on day 1 and 15 (07/15/2020 and 07/29/2020).  She began entecavir on 06/28/2020.  Prednisone was tapered off.  She was on Septra for PCP prophylaxis which is now currently on hold    Other medical problems 07/10/2016.  She underwent PTCA and stent placement in right and left common iliac arteries and left external iliac artery. She is on Plavix.   03/19/2015 EGD revealed gastritis in the body and antrum.  Colonoscopy revealed one hyperplastic polyp.  Repeat EGD on 07/08/2016 was normal.  No evidence of bleeding.  History of she developed peri-oral and intranasal herpes simplex-1.  She was treated with valacyclovir and doxycycline on 10/19/2016.   10/26/2017. She developed flu like symptoms.  Symptoms included cough, myalgias, and fever (tmax unknown).  She was prescribed Mucinex, Tamiflu, and amoxicillin.  She only took amoxicillin x 5 days.  She developed increased liver function tests on 11/02/2017. CMV IgG was positive.  EBV VCA IgG, NA IgG, early antigen antibody IgG were elevated.  EBV VCA IgM was < 36.  Testing was c/w a convalescence/past infection or reactivated infection.  LFTs normalized on 11/15/2017.  Smooth  muscle antibody was 39 (high) on 11/10/2017 and 34 (high) on 11/30/2017.     Chest CT on 11/16/2017 revealed a 2.2 x 2.0 x 2.4 spiculated RIGHT middle lobe mass.  There was tiny nonspecific upper lobe pulmonary nodules greater on RIGHT, largest 3 mm, of uncertain etiology.  There was additional 8 x 8 mm opacity in the posterior sulcus of the RIGHT lower lobe. PET scan on 11/22/2017 revealed a 2 cm hypermetabolic right middle lobe lung mass (SUV 3.4) consistent  with primary lung neoplasm.  There were no findings for mediastinal/hilar lymphadenopathy or metastatic disease.  There were areas of hypermetabolism involving the left oblique abdominal muscles and the anorectal junction. PFTs on 11/18/2017 revealed an FEV1 of 1.22 liters (51%).  DLCO adj was 6.8 mL/mmHg/min (32%).   ENB done on 12/07/2017. Cytology was negative for malignancy. Pathology demonstrated organizing pneumonia and chronic bronchitis. Pathologist commented that organizing pneumonia spanned about 2 mm in one fragment. Cases of focal organizing pneumonia with hypermetabolism on FDG-PET have been described, but changes of this type can also be adjacent to a neoplasm. Patient prescribed a daily dose of Prednisone 10 mg   07/24/2020  repeat  chest CT on  revealed Lung-RADS 2, benign appearance or behavior. There was emphysema, aortic  atherosclerosis, and coronary artery calcifications.  diarrhea.  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  She received azithromycin x 3 days.  She received ciprofloxacin x 10 days without improvement.  She has seen ID in [place].  She began Bactrim on 03/04/2018 (discontinued on 03/11/2018) secondary to increase in creatine.   Chronic kidney disease Urinalysis on 11/15/2017 revealed revealed no hemoglobin, bilirubin or active sediment.   Liver function tests increased on 02/15/2019.  Work-up on 02/16/2019 revealed hepatitis B E antibody positive.  Negative studies included:  hepatitis A total antibody, hepatitis A IgM, hepatitis B surface antigen, hepatitis B E antigen.  Hepatitis B DNA was not detected.  Folate was 80.5 and vitamin B12 was 443.  She has received her vaccinations:  She has completed the PCV13, PPSV23, and HiB.  She received Menvio (quadrivalent meningoccal vaccine) and Bexero (univalent serogroup B meningoccal vaccine) on 07/06/2019.  She was diagnosed with C difficile + diarrhea on 08/12/2020.  She completed a course of oral vancomycin.  Stool was negative for C. difficile on 08/27/2020.  11/17/2020 - 11/20/2020 [place] admission with left L5 varicella zoster.  Lesion was positive for VZV. She was treated with ceftazidime and a valacyclovir.  She was discharged on 9 days of Valtrex 1 gm 3 times daily followed by 500 mg daily indefinitely for suppression and doxycycline.  History of discoid lupus  #Positive hepatitis B core antibody Previously on entecavir 0.5 mg daily, currently she has finished a course. Dose adjusted based on renal function. CrCl 30-49 ml/min   50% dosing (0.5 mg QOD) CrCl >= 50 ml/min 100% dosing (0.5 mg a day). With her kidney function currently QOD She has completed course of Entecavir and stopped 08/14/2021  # PCP prophylaxis Patient was on Septra DS on Mondays, Wednesdays, and Fridays, which was held due to fluctuated kidney functions.  Currently off Septra.  12/03/2021, CT chest lung cancer screening showed new right middle lobe pulmonary nodule with an aggressive appearance characterized as lung RADS 4 VS.  12/22/2021 PET showed No abnormal FDG avidity associated with the resolving right middle lobe pulmonary nodule now with a 12 mm ground-glass opacity in the area of prior solid nodularity, most consistent with an infectious or inflammatory etiology  INTERVAL HISTORY SAARA KIJOWSKI is a 66 y.o. female who has above history reviewed by me today presents for follow up visit  Patient remained on prednisone '10mg'$  daily. No  new complaints.  Chronic fatigue unchanged.   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   Elevated liver function tests 11/03/2017   GERD (gastroesophageal reflux disease)    Heme positive stool 01/29/2015   Hepatitis C 07/12/2016   Ab positive, RNA negative   HLD (hyperlipidemia)    Hypertension    Hypothyroidism    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/23/2017   Post PTCA 07/12/2016   iliac stents bilaterally 2017   Thrombocytopenia ([place]) 10/04/2016   Wears dentures    full upper and lower    Past Surgical History:  Procedure Laterality Date   ELECTROMAGNETIC NAVIGATION BROCHOSCOPY N/A 12/07/2017   Procedure: ELECTROMAGNETIC NAVIGATION BRONCHOSCOPY;  Surgeon: Flora Lipps, MD;  Location: [facility] ORS;  Service: Cardiopulmonary;  Laterality: N/A;   ESOPHAGOGASTRODUODENOSCOPY (EGD) WITH PROPOFOL N/A 07/08/2016   Procedure:  ESOPHAGOGASTRODUODENOSCOPY (EGD) WITH PROPOFOL;  Surgeon: Manya Silvas, MD;  Location[facility] ENDOSCOPY;  Service: Endoscopy;  Laterality: N/A;   KNEE SURGERY Right    repair of acl tear   PERIPHERAL VASCULAR CATHETERIZATION N/A 07/10/2016   Procedure: Lower Extremity Angiography;  Surgeon: Katha Cabal, MD;  Location: [place] [facility];  Service: Cardiovascular;  Laterality: N/A;   PERIPHERAL VASCULAR CATHETERIZATION N/A 07/10/2016   Procedure: Abdominal Aortogram w/Lower Extremity;  Surgeon: Katha Cabal, MD;  Location: [place] [facility];  Service: Cardiovascular;  Laterality: N/A;   PERIPHERAL VASCULAR CATHETERIZATION  07/10/2016   Procedure: Lower Extremity Intervention;  Surgeon: Katha Cabal, MD;  Location: [place] [facility];  Service: Cardiovascular;;    Family History  Problem Relation Age of Onset   Diabetes  Mother    Hypertension Mother    Diabetes Maternal Grandfather    Hypertension Maternal Grandfather    Breast cancer Neg Hx     Social History:  reports that she quit smoking about 5 years ago. Her smoking use included cigarettes. She has a 58.75 pack-year smoking history. She has never used smokeless tobacco. She reports that she does not drink alcohol and does not use drugs.  Former smoker, 58-pack-year smoking history.   She has a daughter, [name] and a daughter named [name].  She lives in [place]. The patient is alone today.  Allergies:  Allergies  Allergen Reactions   Ciprofloxacin Swelling    Facial swelling following single oral dose.    Codeine Anaphylaxis   Nsaids Other (See Comments)    Patient has Hep C.     Current Medications: Current Outpatient Medications  Medication Sig Dispense Refill   albuterol (VENTOLIN HFA) 108 (90 Base) MCG/ACT inhaler SMARTSIG:2 Puff(s) By Mouth Every 4 Hours PRN     allopurinol (ZYLOPRIM) 100 MG tablet TAKE 1 TABLET BY MOUTH EVERY DAY 30 tablet 5   clopidogrel (PLAVIX) 75 MG tablet Take 1 tablet (75 mg total) by mouth daily. 30 tablet 5   cyanocobalamin 500 MCG tablet Take 500 mcg by mouth daily.     folic acid (FOLVITE) 1 MG tablet TAKE 1 TABLET (1 MG TOTAL) BY MOUTH DAILY. (Patient taking differently: Take 1 mg by mouth every Monday, Wednesday, and Friday.) 90 tablet 1   hydroxychloroquine (PLAQUENIL) 200 MG tablet Take 200 mg by mouth 2 (two) times daily.     levothyroxine (SYNTHROID, LEVOTHROID) 75 MCG tablet Take 75 mcg by mouth daily before breakfast.      lisinopril-hydrochlorothiazide (ZESTORETIC) 20-25 MG tablet Take 1 tablet by mouth daily. 90 tablet 4   Omeprazole 20 MG TBDD Take 20 mg by mouth daily as needed (acid reflux symptoms).     predniSONE (DELTASONE) 5 MG tablet Take 1 tablet (5 mg total) by mouth daily with breakfast. 60 tablet 0   prochlorperazine (COMPAZINE) 10 MG tablet Take 1 tablet (10 mg total) by mouth every 6  (six) hours as needed for nausea or vomiting. 30 tablet 0   rosuvastatin (CRESTOR) 40 MG tablet Take 1 tablet (40 mg total) by mouth daily. 90 tablet 3   No current facility-administered medications for this visit.    Review of Systems  Constitutional:  Negative for appetite change, chills, fatigue and fever.  HENT:   Negative for hearing loss and voice change.   Eyes:  Negative for eye problems.  Respiratory:  Negative for chest tightness and cough.   Cardiovascular:  Negative for chest pain.  Gastrointestinal:  Negative  for abdominal distention, abdominal pain and blood in stool.  Endocrine: Negative for hot flashes.  Genitourinary:  Negative for difficulty urinating and frequency.   Musculoskeletal:  Negative for arthralgias.  Skin:  Negative for itching and rash.  Neurological:  Negative for extremity weakness.  Hematological:  Negative for adenopathy.  Psychiatric/Behavioral:  Negative for confusion. The patient is not nervous/anxious.      Performance status (ECOG): 1  Vital Signs Blood pressure (!) 147/63, pulse 68, temperature (!) 96.3 F (35.7 C), temperature source Tympanic, height '5\' 3"'$  (1.6 m), weight 177 lb (80.3 kg).    Laboratory findings     Latest Ref Rng & Units 03/30/2022   12:59 PM 03/13/2022    8:01 AM 03/02/2022    8:45 AM  CBC  WBC 4.0 - 10.5 K/uL 6.6  3.8  4.4   Hemoglobin 12.0 - 15.0 g/dL 9.5  10.3  8.9   Hematocrit 36.0 - 46.0 % 30.3  32.1  28.8   Platelets 150 - 400 K/uL 127  93  118       Latest Ref Rng & Units 03/30/2022   12:59 PM 03/02/2022    8:45 AM 02/23/2022    8:07 AM  CMP  Glucose 70 - 99 mg/dL 101  92  111   BUN 8 - 23 mg/dL 38  36  31   Creatinine 0.44 - 1.00 mg/dL 1.29  1.35  1.34   Sodium 135 - 145 mmol/L 141  139  143   Potassium 3.5 - 5.1 mmol/L 4.2  3.4  3.7   Chloride 98 - 111 mmol/L 103  103  106   CO2 22 - 32 mmol/L 31  29  32   Calcium 8.9 - 10.3 mg/dL 9.0  8.8  9.2   Total Protein 6.5 - 8.1 g/dL 7.1  8.2  6.4   Total  Bilirubin 0.3 - 1.2 mg/dL 0.9  0.6  0.8   Alkaline Phos 38 - 126 U/L 42  38  47   AST 15 - 41 U/L '19  22  17   '$ ALT 0 - 44 U/L 15  17  16

## 2022-03-30 NOTE — Assessment & Plan Note (Addendum)
12/05/2021 decrease of hemoglobin to 9.9.  MCV 95.2.  Platelet count 120. Additional blood work was obtained on 12/08/2021. LDH is normal, adequate B12 and folate level, normal iron panel.  There is increased reticulocyte %-5%.  Cold agglutinin titer negative. Bilirubin is normal. 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  Decrease prednisone to '5mg'$  daily.  Previously tolerated IVIG '1mg'$ /kg x 2 doses. She responded transiently.  I discussed with her about option of re-challenge with Rituximab, however she will need to take Entecavir. Patient has CKD, and has she is not enthusiastic about starting Rituximab.  We discussed about repeat IVIG and also treat anemia of CKD.  She agrees.

## 2022-03-30 NOTE — Assessment & Plan Note (Signed)
Stable.       - Continue to monitor

## 2022-03-30 NOTE — Assessment & Plan Note (Signed)
Iron panel showed adequate ferritin level.  Recommend procrit.  Rationale and side effects were reviewed and discussed patient.  She agrees with treatment.

## 2022-03-30 NOTE — Assessment & Plan Note (Signed)
Encourage oral hydration. Avoid nephrotoxins. 

## 2022-03-31 ENCOUNTER — Telehealth: Payer: Self-pay

## 2022-03-31 LAB — HAPTOGLOBIN: Haptoglobin: <10 mg/dL — ABNORMAL LOW (ref 37–355)

## 2022-03-31 NOTE — Telephone Encounter (Signed)
Unable to reach pt by phone. Detailed VM left letting pt know and mychart message sent.   Please add Procrit inj *-new* to appt on 8/7 and add procrit inj to next visit.

## 2022-03-31 NOTE — Telephone Encounter (Signed)
-----   Message from Earlie Server, MD sent at 03/30/2022 10:29 PM EDT ----- Please let her know that iron level is adequate.  Recommend procrit injection. We discussed. Please arrange procrit on 8/7 Change retacrit to procrit at next visit. LOS edited

## 2022-04-01 ENCOUNTER — Encounter: Payer: Self-pay | Admitting: Oncology

## 2022-04-03 ENCOUNTER — Inpatient Hospital Stay: Payer: BC Managed Care – PPO | Attending: Oncology

## 2022-04-03 VITALS — BP 123/58 | HR 63 | Temp 96.3°F | Resp 18

## 2022-04-03 DIAGNOSIS — D696 Thrombocytopenia, unspecified: Secondary | ICD-10-CM | POA: Insufficient documentation

## 2022-04-03 DIAGNOSIS — Z87891 Personal history of nicotine dependence: Secondary | ICD-10-CM | POA: Diagnosis not present

## 2022-04-03 DIAGNOSIS — D472 Monoclonal gammopathy: Secondary | ICD-10-CM | POA: Diagnosis not present

## 2022-04-03 DIAGNOSIS — D631 Anemia in chronic kidney disease: Secondary | ICD-10-CM | POA: Diagnosis present

## 2022-04-03 DIAGNOSIS — D5911 Warm autoimmune hemolytic anemia: Secondary | ICD-10-CM | POA: Insufficient documentation

## 2022-04-03 DIAGNOSIS — R7989 Other specified abnormal findings of blood chemistry: Secondary | ICD-10-CM | POA: Insufficient documentation

## 2022-04-03 DIAGNOSIS — R918 Other nonspecific abnormal finding of lung field: Secondary | ICD-10-CM | POA: Diagnosis not present

## 2022-04-03 DIAGNOSIS — B1920 Unspecified viral hepatitis C without hepatic coma: Secondary | ICD-10-CM | POA: Diagnosis not present

## 2022-04-03 DIAGNOSIS — N1832 Chronic kidney disease, stage 3b: Secondary | ICD-10-CM | POA: Diagnosis present

## 2022-04-03 DIAGNOSIS — D591 Autoimmune hemolytic anemia, unspecified: Secondary | ICD-10-CM

## 2022-04-03 IMAGING — MG MM DIGITAL SCREENING BILAT W/ TOMO AND CAD
8 series · 8 of 24 positions shown · non-contrast
Comparison: Previous exam(s).

CLINICAL DATA: Screening.

EXAM:
DIGITAL SCREENING BILATERAL MAMMOGRAM WITH TOMOSYNTHESIS AND CAD
TECHNIQUE: Bilateral screening digital craniocaudal and mediolateral oblique
mammograms were obtained. Bilateral screening digital breast
tomosynthesis was performed. The images were evaluated with
computer-aided detection.

[R CC synth-2D]
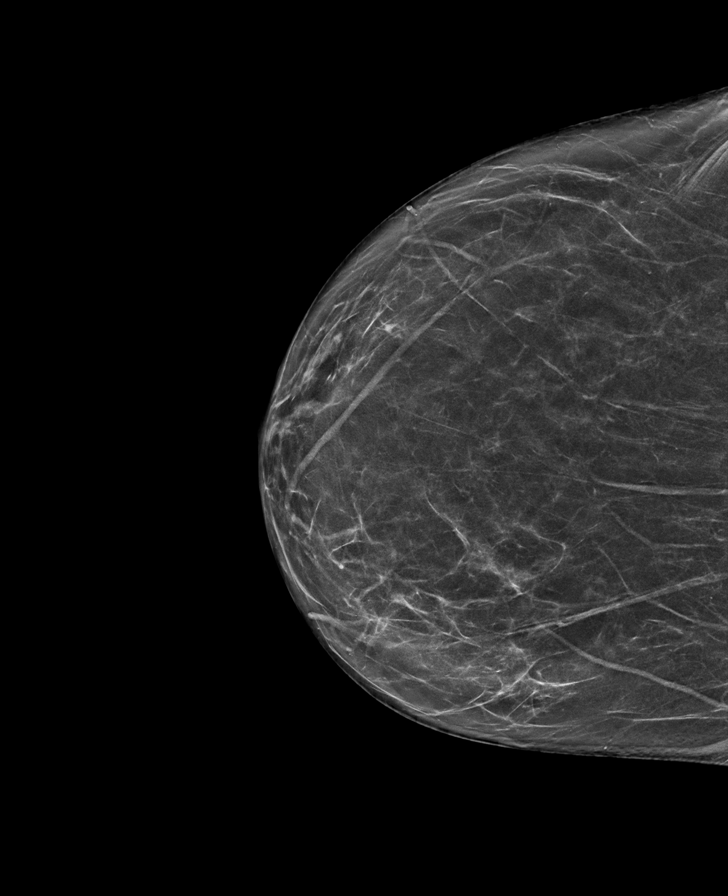

[L MLO synth-2D]
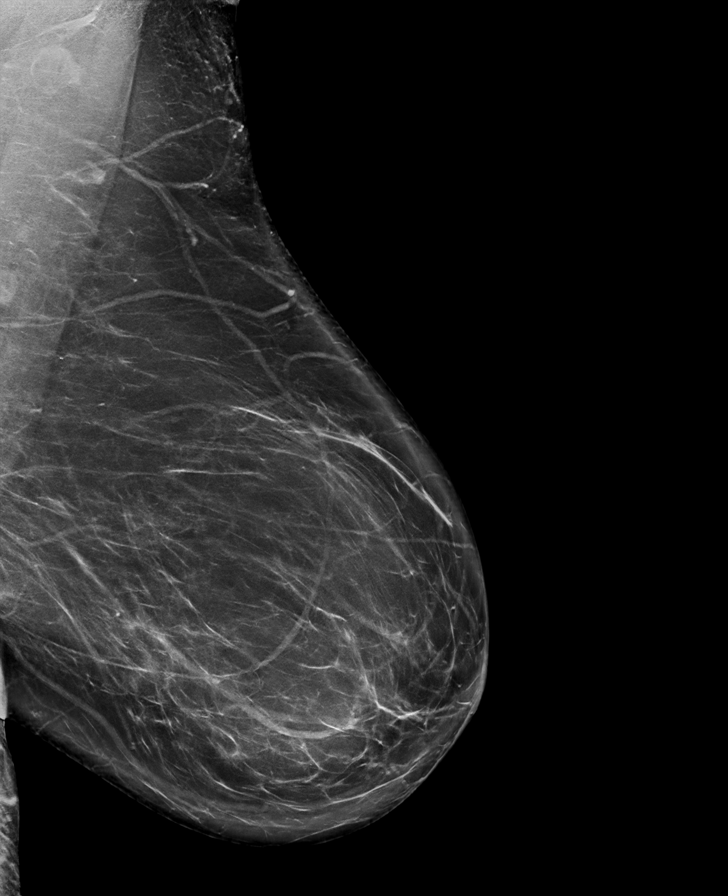

[L CC synth-2D]
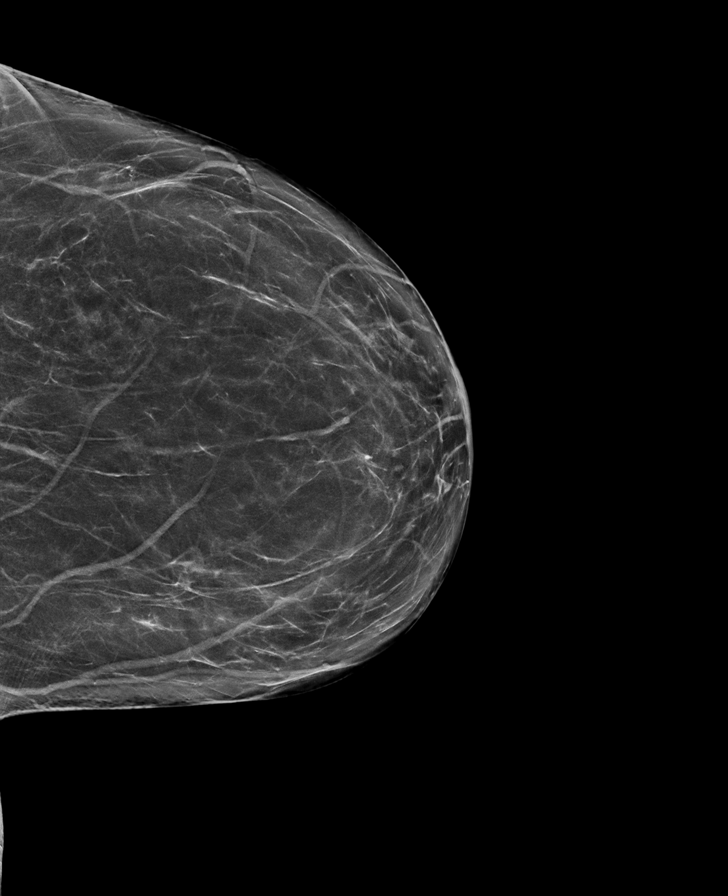

[R MLO synth-2D]
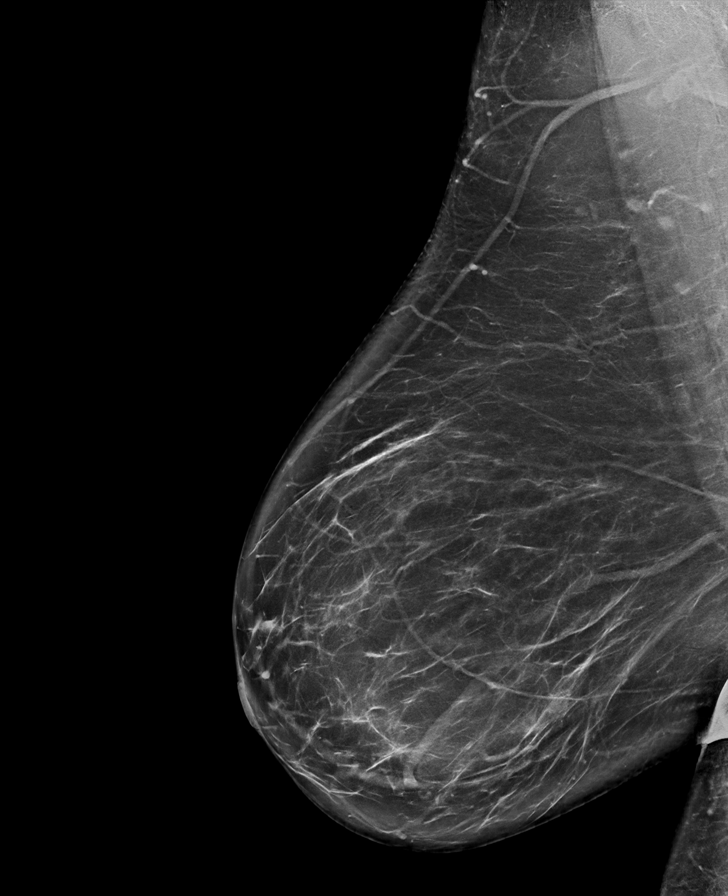

[R CC tomo · tomo slice 36/71.0]
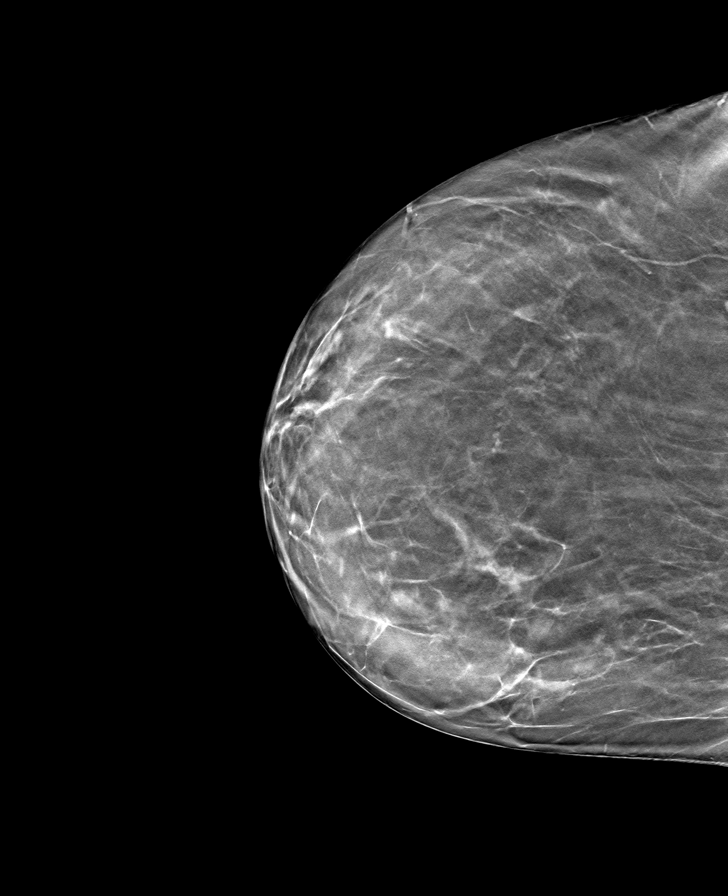

[L MLO tomo · tomo slice 45/90.0]
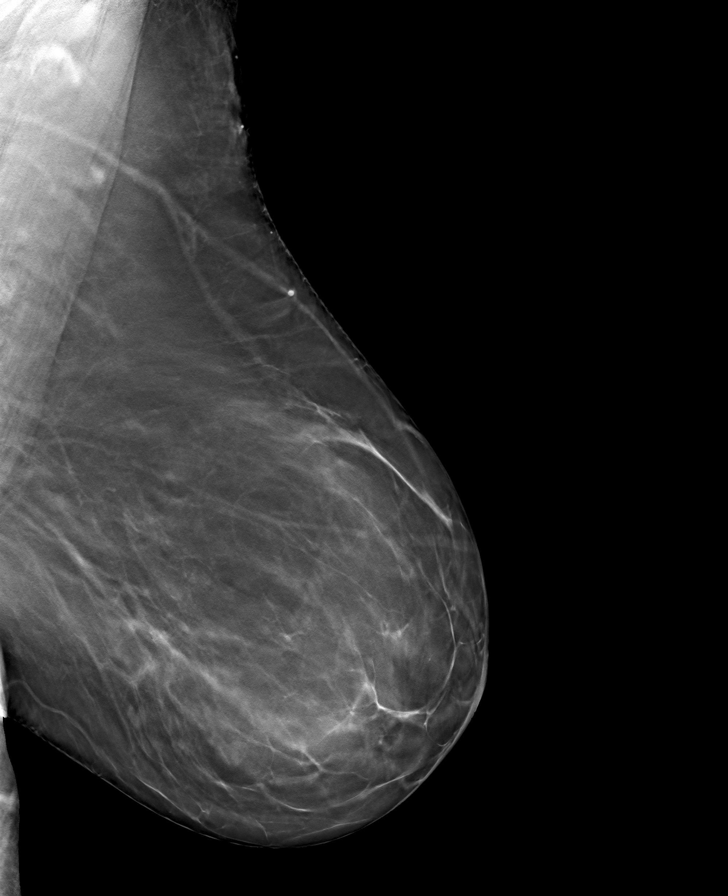

[L CC tomo · tomo slice 39/76.0]
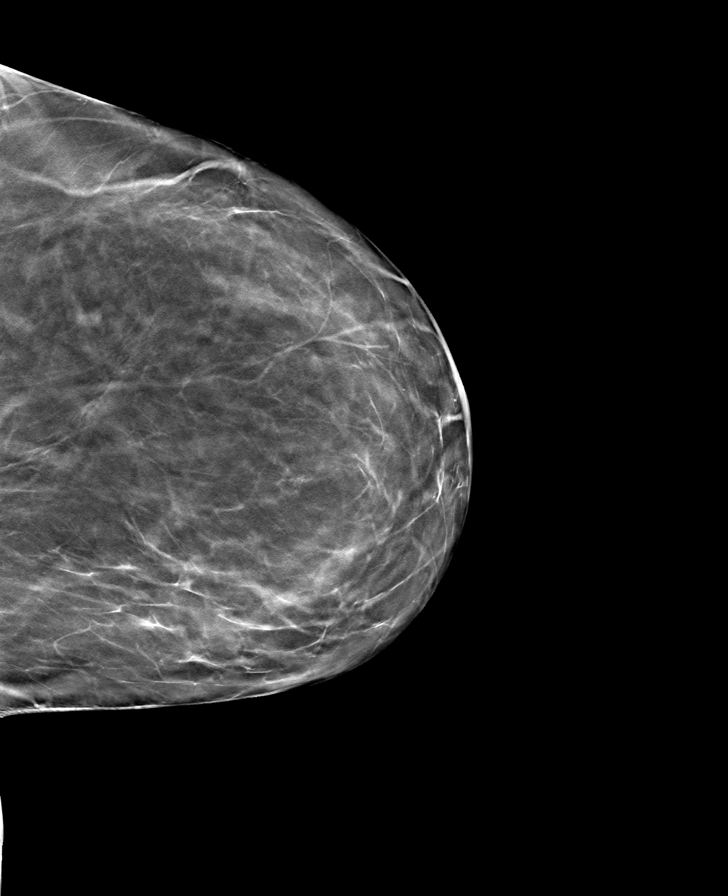

[R MLO tomo · tomo slice 45/89.0]
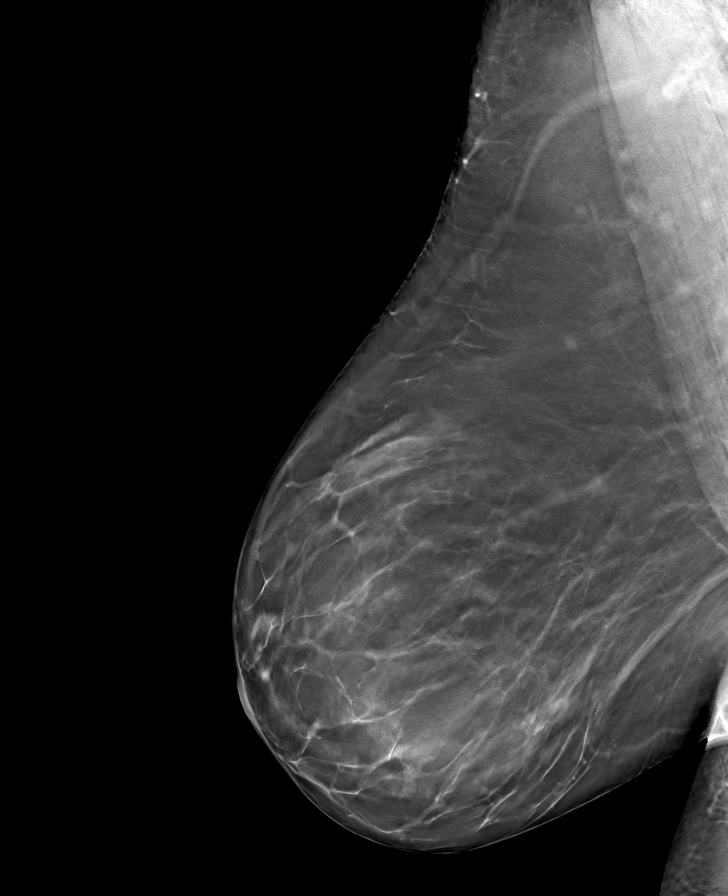

[8 of 24 positions shown; findings below may reference images not displayed]

ACR Breast Density Category b: There are scattered areas of
fibroglandular density.
FINDINGS: There are no findings suspicious for malignancy.
IMPRESSION: No mammographic evidence of malignancy. A result letter of this
screening mammogram will be mailed directly to the patient.

RECOMMENDATION:
Screening mammogram in one year. (Code:51-O-LD2)

BI-RADS CATEGORY  1: Negative.

## 2022-04-03 MED ORDER — EPOETIN ALFA 20000 UNIT/ML IJ SOLN
20000.0000 [IU] | Freq: Once | INTRAMUSCULAR | Status: AC
  Administered 2022-04-03: 20000 [IU] via SUBCUTANEOUS
  Filled 2022-04-03: qty 1

## 2022-04-03 MED ORDER — ACETAMINOPHEN 325 MG PO TABS
650.0000 mg | ORAL_TABLET | Freq: Four times a day (QID) | ORAL | Status: DC | PRN
  Administered 2022-04-03: 650 mg via ORAL
  Filled 2022-04-03: qty 2

## 2022-04-03 MED ORDER — DIPHENHYDRAMINE HCL 25 MG PO CAPS
25.0000 mg | ORAL_CAPSULE | Freq: Four times a day (QID) | ORAL | Status: DC | PRN
  Administered 2022-04-03: 25 mg via ORAL
  Filled 2022-04-03: qty 1

## 2022-04-03 MED ORDER — IMMUNE GLOBULIN (HUMAN) 5 GM/50ML IV SOLN
65.0000 g | Freq: Once | INTRAVENOUS | Status: AC
  Administered 2022-04-03: 65 g via INTRAVENOUS
  Filled 2022-04-03: qty 200

## 2022-04-03 MED ORDER — DEXTROSE 5 % IV SOLN
INTRAVENOUS | Status: DC
  Filled 2022-04-03: qty 250

## 2022-04-03 NOTE — Patient Instructions (Signed)
[facility] AT [place]  Discharge Instructions: Thank you for choosing [facility] to provide your oncology and hematology care.  If you have a lab appointment with the [facility], please go directly to the [facility] and check in at the registration area.  Wear comfortable clothing and clothing appropriate for easy access to any Portacath or PICC line.   We strive to give you quality time with your provider. You may need to reschedule your appointment if you arrive late (15 or more minutes).  Arriving late affects you and other patients whose appointments are after yours.  Also, if you miss three or more appointments without notifying the office, you may be dismissed from the clinic at the provider's discretion.      For prescription refill requests, have your pharmacy contact our office and allow 72 hours for refills to be completed.    Today you received the following chemotherapy and/or immunotherapy agents IVIG and EPOGEN      To help prevent nausea and vomiting after your treatment, we encourage you to take your nausea medication as directed.  BELOW ARE SYMPTOMS THAT SHOULD BE REPORTED IMMEDIATELY: *FEVER GREATER THAN 100.4 F (38 C) OR HIGHER *CHILLS OR SWEATING *NAUSEA AND VOMITING THAT IS NOT CONTROLLED WITH YOUR NAUSEA MEDICATION *UNUSUAL SHORTNESS OF BREATH *UNUSUAL BRUISING OR BLEEDING *URINARY PROBLEMS (pain or burning when urinating, or frequent urination) *BOWEL PROBLEMS (unusual diarrhea, constipation, pain near the anus) TENDERNESS IN MOUTH AND THROAT WITH OR WITHOUT PRESENCE OF ULCERS (sore throat, sores in mouth, or a toothache) UNUSUAL RASH, SWELLING OR PAIN  UNUSUAL VAGINAL DISCHARGE OR ITCHING   Items with * indicate a potential emergency and should be followed up as soon as possible or go to the Emergency Department if any problems should occur.  Please show the CHEMOTHERAPY ALERT CARD or IMMUNOTHERAPY ALERT CARD at  check-in to the Emergency Department and triage nurse.  Should you have questions after your visit or need to cancel or reschedule your appointment, please contact [facility]CER [place] AT [place]  [phone] and follow the prompts.  Office hours are 8:00 a.m. to 4:30 p.m. Monday - Friday. Please note that voicemails left after 4:00 p.m. may not be returned until the following business day.  We are closed weekends and major holidays. You have access to a nurse at all times for urgent questions. Please call the main number to the clinic [phone] and follow the prompts.  For any non-urgent questions, you may also contact your provider using MyChart. We now offer e-Visits for anyone 57 and older to request care online for non-urgent symptoms. For details visit [email or web address].   Also download the MyChart app! Go to the app store, search "MyChart", open the app, select [facility], and log in with your MyChart username and password.  Masks are optional in the cancer centers. If you would like for your care team to wear a mask while they are taking care of you, please let them know. For doctor visits, patients may have with them one support person who is at least 64 years old. At this time, visitors are not allowed in the infusion area.  Immune Globulin Injection What is this medication? IMMUNE GLOBULIN (im MUNE  GLOB yoo lin) helps to prevent or reduce the severity of certain infections in patients who are at risk. This medicine is collected from the pooled blood of many donors. It is used to treat immune system problems, thrombocytopenia, and Kawasaki syndrome.  This medicine may be used for other purposes; ask your health care provider or pharmacist if you have questions. This medicine may be used for other purposes; ask your health care provider or pharmacist if you have questions. COMMON BRAND NAME(S): ASCENIV, Baygam, BIVIGAM, Carimune, Carimune NF, cutaquig, Cuvitru,  Flebogamma, Flebogamma DIF, GamaSTAN, GamaSTAN S/D, Gamimune N, Gammagard, Gammagard S/D, Gammaked, Gammaplex, Gammar-P IV, Gamunex, Gamunex-C, Hizentra, Iveegam, Iveegam EN, Octagam, Panglobulin, Panglobulin NF, panzyga, Polygam S/D, Privigen, Sandoglobulin, Venoglobulin-S, Vigam, Vivaglobulin, Xembify What should I tell my care team before I take this medication? They need to know if you have any of these conditions: diabetes extremely low or no immune antibodies in the blood heart disease history of blood clots hyperprolinemia infection in the blood, sepsis kidney disease recently received or scheduled to receive a vaccination an unusual or allergic reaction to human immune globulin, albumin, maltose, sucrose, other medicines, foods, dyes, or preservatives pregnant or trying to get pregnant breast-feeding How should I use this medication? This medicine is for injection into a muscle or infusion into a vein or skin. It is usually given by a health care professional in a hospital or clinic setting. In rare cases, some brands of this medicine might be given at home. You will be taught how to give this medicine. Use exactly as directed. Take your medicine at regular intervals. Do not take your medicine more often than directed. Talk to your pediatrician regarding the use of this medicine in children. While this drug may be prescribed for selected conditions, precautions do apply. Overdosage: If you think you have taken too much of this medicine contact a poison control center or emergency room at once. NOTE: This medicine is only for you. Do not share this medicine with others. Overdosage: If you think you have taken too much of this medicine contact a poison control center or emergency room at once. NOTE: This medicine is only for you. Do not share this medicine with others. What if I miss a dose? It is important not to miss your dose. Call your doctor or health care professional if you are  unable to keep an appointment. If you give yourself the medicine and you miss a dose, take it as soon as you can. If it is almost time for your next dose, take only that dose. Do not take double or extra doses. What may interact with this medication? aspirin and aspirin-like medicines cisplatin cyclosporine medicines for infection like acyclovir, adefovir, amphotericin B, bacitracin, cidofovir, foscarnet, ganciclovir, gentamicin, pentamidine, vancomycin NSAIDS, medicines for pain and inflammation, like ibuprofen or naproxen pamidronate vaccines zoledronic acid This list may not describe all possible interactions. Give your health care provider a list of all the medicines, herbs, non-prescription drugs, or dietary supplements you use. Also tell them if you smoke, drink alcohol, or use illegal drugs. Some items may interact with your medicine. This list may not describe all possible interactions. Give your health care provider a list of all the medicines, herbs, non-prescription drugs, or dietary supplements you use. Also tell them if you smoke, drink alcohol, or use illegal drugs. Some items may interact with your medicine. What should I watch for while using this medication? Your condition will be monitored carefully while you are receiving this medicine. This medicine is made from pooled blood donations of many different people. It may be possible to pass an infection in this medicine. However, the donors are screened for infections and all products are tested for HIV and hepatitis. The medicine  is treated to kill most or all bacteria and viruses. Talk to your doctor about the risks and benefits of this medicine. Do not have vaccinations for at least 14 days before, or until at least 3 months after receiving this medicine. What side effects may I notice from receiving this medication? Side effects that you should report to your doctor or health care professional as soon as possible: allergic  reactions like skin rash, itching or hives, swelling of the face, lips, or tongue blue colored lips or skin breathing problems chest pain or tightness fever signs and symptoms of aseptic meningitis such as stiff neck; sensitivity to light; headache; drowsiness; fever; nausea; vomiting; rash signs and symptoms of a blood clot such as chest pain; shortness of breath; pain, swelling, or warmth in the leg signs and symptoms of hemolytic anemia such as fast heartbeat; tiredness; dark yellow or brown urine; or yellowing of the eyes or skin signs and symptoms of kidney injury like trouble passing urine or change in the amount of urine sudden weight gain swelling of the ankles, feet, hands Side effects that usually do not require medical attention (report to your doctor or health care professional if they continue or are bothersome): diarrhea flushing headache increased sweating joint pain muscle cramps muscle pain nausea pain, redness, or irritation at site where injected tiredness This list may not describe all possible side effects. Call your doctor for medical advice about side effects. You may report side effects to FDA at 1-800-FDA-1088. This list may not describe all possible side effects. Call your doctor for medical advice about side effects. You may report side effects to FDA at 1-800-FDA-1088. Where should I keep my medication? Keep out of the reach of children. This drug is usually given in a hospital or clinic and will not be stored at home. In rare cases, some brands of this medicine may be given at home. If you are using this medicine at home, you will be instructed on how to store this medicine. Throw away any unused medicine after the expiration date on the label. NOTE: This sheet is a summary. It may not cover all possible information. If you have questions about this medicine, talk to your doctor, pharmacist, or health care provider.  2023 Elsevier/Gold Standard (2019-03-22  00:00:00).  Epoetin Alfa Injection What is this medication? EPOETIN ALFA (e POE e tin AL fa) treats low levels of red blood cells (anemia) caused by kidney disease, chemotherapy, or HIV medications. It can also be used in people who are at risk for blood loss during surgery. It works by Building control surveyor make more red blood cells, which reduces the need for blood transfusions. This medicine may be used for other purposes; ask your health care provider or pharmacist if you have questions. COMMON BRAND NAME(S): Epogen, Procrit, Retacrit What should I tell my care team before I take this medication? They need to know if you have any of these conditions: Blood clots Cancer Heart disease High blood pressure On dialysis Seizures Stroke An unusual or allergic reaction to epoetin alfa, albumin, benzyl alcohol, other medications, foods, dyes, or preservatives Pregnant or trying to get pregnant Breast-feeding How should I use this medication? This medication is injected into a vein or under the skin. It is usually given by your care team in a hospital or clinic setting. It may also be given at home. If you get this medication at home, you will be taught how to prepare and give it. Use exactly  as directed. Take it as directed on the prescription label at the same time every day. Keep taking it unless your care team tells you to stop. It is important that you put your used needles and syringes in a special sharps container. Do not put them in a trash can. If you do not have a sharps container, call your pharmacist or care team to get one. A special MedGuide will be given to you by the pharmacist with each prescription and refill. Be sure to read this information carefully each time. Talk to your care team about the use of this medication in children. While this medication may be used in children as young as 74 month of age for selected conditions, precautions do apply. Overdosage: If you think you have  taken too much of this medicine contact a poison control center or emergency room at once. NOTE: This medicine is only for you. Do not share this medicine with others. What if I miss a dose? If you miss a dose, take it as soon as you can. If it is almost time for your next dose, take only that dose. Do not take double or extra doses. What may interact with this medication? Darbepoetin alfa Methoxy polyethylene glycol-epoetin beta This list may not describe all possible interactions. Give your health care provider a list of all the medicines, herbs, non-prescription drugs, or dietary supplements you use. Also tell them if you smoke, drink alcohol, or use illegal drugs. Some items may interact with your medicine. What should I watch for while using this medication? Visit your care team for regular checks on your progress. Check your blood pressure as directed. Know what your blood pressure should be and when to contact your care team. Your condition will be monitored carefully while you are receiving this medication. You may need blood work while taking this medication. What side effects may I notice from receiving this medication? Side effects that you should report to your care team as soon as possible: Allergic reactions--skin rash, itching, hives, swelling of the face, lips, tongue, or throat Blood clot--pain, swelling, or warmth in the leg, shortness of breath, chest pain Heart attack--pain or tightness in the chest, shoulders, arms, or jaw, nausea, shortness of breath, cold or clammy skin, feeling faint or lightheaded Increase in blood pressure Rash, fever, and swollen lymph nodes Redness, blistering, peeling, or loosening of the skin, including inside the mouth Seizures Stroke--sudden numbness or weakness of the face, arm, or leg, trouble speaking, confusion, trouble walking, loss of balance or coordination, dizziness, severe headache, change in vision Side effects that usually do not  require medical attention (report to your care team if they continue or are bothersome): Bone, joint, or muscle pain Cough Headache Nausea Pain, redness, or irritation at injection site This list may not describe all possible side effects. Call your doctor for medical advice about side effects. You may report side effects to FDA at 1-800-FDA-1088. Where should I keep my medication? Keep out of the reach of children and pets. Store in a refrigerator. Do not freeze. Do not shake. Protect from light. Keep this medication in the original container until you are ready to take it. See product for storage information. Get rid of any unused medication after the expiration date. To get rid of medications that are no longer needed or have expired: Take the medication to a medication take-back program. Check with your pharmacy or law enforcement to find a location. If you cannot return the medication,  ask your pharmacist or care team how to get rid of the medication safely. NOTE: This sheet is a summary. It may not cover all possible information. If you have questions about this medicine, talk to your doctor, pharmacist, or health care provider.  2023 Elsevier/Gold Standard (2021-11-27 00:00:00)

## 2022-04-06 ENCOUNTER — Inpatient Hospital Stay: Payer: BC Managed Care – PPO

## 2022-04-06 VITALS — BP 138/73 | HR 73 | Temp 97.2°F | Resp 18 | Wt 176.7 lb

## 2022-04-06 DIAGNOSIS — D5911 Warm autoimmune hemolytic anemia: Secondary | ICD-10-CM | POA: Diagnosis not present

## 2022-04-06 DIAGNOSIS — D591 Autoimmune hemolytic anemia, unspecified: Secondary | ICD-10-CM

## 2022-04-06 MED ORDER — ACETAMINOPHEN 325 MG PO TABS
650.0000 mg | ORAL_TABLET | Freq: Four times a day (QID) | ORAL | Status: DC | PRN
  Administered 2022-04-06: 650 mg via ORAL
  Filled 2022-04-06: qty 2

## 2022-04-06 MED ORDER — DIPHENHYDRAMINE HCL 25 MG PO CAPS
25.0000 mg | ORAL_CAPSULE | Freq: Four times a day (QID) | ORAL | Status: DC | PRN
  Administered 2022-04-06: 25 mg via ORAL
  Filled 2022-04-06: qty 1

## 2022-04-06 MED ORDER — IMMUNE GLOBULIN (HUMAN) 5 GM/50ML IV SOLN
65.0000 g | Freq: Once | INTRAVENOUS | Status: AC
  Administered 2022-04-06: 65 g via INTRAVENOUS
  Filled 2022-04-06: qty 400

## 2022-04-06 MED ORDER — DEXTROSE 5 % IV SOLN
INTRAVENOUS | Status: DC
  Filled 2022-04-06: qty 250

## 2022-04-06 MED ORDER — IMMUNE GLOBULIN (HUMAN) 10 GM/100ML IV SOLN
1.0000 g/kg | Freq: Once | INTRAVENOUS | Status: DC
  Filled 2022-04-06: qty 650

## 2022-04-06 NOTE — Patient Instructions (Signed)
[facility] CANCER CTR AT [place]  Discharge Instructions: Thank you for choosing [facility] to provide your oncology and hematology care.  If you have a lab appointment with the [facility], please go directly to the [facility] and check in at the registration area.  Wear comfortable clothing and clothing appropriate for easy access to any Portacath or PICC line.   We strive to give you quality time with your provider. You may need to reschedule your appointment if you arrive late (15 or more minutes).  Arriving late affects you and other patients whose appointments are after yours.  Also, if you miss three or more appointments without notifying the office, you may be dismissed from the clinic at the provider's discretion.      For prescription refill requests, have your pharmacy contact our office and allow 72 hours for refills to be completed.    Today you received the following chemotherapy and/or immunotherapy agents IVIG      To help prevent nausea and vomiting after your treatment, we encourage you to take your nausea medication as directed.  BELOW ARE SYMPTOMS THAT SHOULD BE REPORTED IMMEDIATELY: *FEVER GREATER THAN 100.4 F (38 C) OR HIGHER *CHILLS OR SWEATING *NAUSEA AND VOMITING THAT IS NOT CONTROLLED WITH YOUR NAUSEA MEDICATION *UNUSUAL SHORTNESS OF BREATH *UNUSUAL BRUISING OR BLEEDING *URINARY PROBLEMS (pain or burning when urinating, or frequent urination) *BOWEL PROBLEMS (unusual diarrhea, constipation, pain near the anus) TENDERNESS IN MOUTH AND THROAT WITH OR WITHOUT PRESENCE OF ULCERS (sore throat, sores in mouth, or a toothache) UNUSUAL RASH, SWELLING OR PAIN  UNUSUAL VAGINAL DISCHARGE OR ITCHING   Items with * indicate a potential emergency and should be followed up as soon as possible or go to the Emergency Department if any problems should occur.  Please show the CHEMOTHERAPY ALERT CARD or IMMUNOTHERAPY ALERT CARD at check-in to the  Emergency Department and triage nurse.  Should you have questions after your visit or need to cancel or reschedule your appointment, please contact [facility] CANCER [place] AT [place]  [phone] and follow the prompts.  Office hours are 8:00 a.m. to 4:30 p.m. Monday - Friday. Please note that voicemails left after 4:00 p.m. may not be returned until the following business day.  We are closed weekends and major holidays. You have access to a nurse at all times for urgent questions. Please call the main number to the clinic [phone] and follow the prompts.  For any non-urgent questions, you may also contact your provider using MyChart. We now offer e-Visits for anyone 33 and older to request care online for non-urgent symptoms. For details visit [email or web address].   Also download the MyChart app! Go to the app store, search "MyChart", open the app, select , and log in with your MyChart username and password.  Masks are optional in the cancer centers. If you would like for your care team to wear a mask while they are taking care of you, please let them know. For doctor visits, patients may have with them one support person who is at least 64 years old. At this time, visitors are not allowed in the infusion area.  Immune Globulin Injection What is this medication? IMMUNE GLOBULIN (im MUNE  GLOB yoo lin) helps to prevent or reduce the severity of certain infections in patients who are at risk. This medicine is collected from the pooled blood of many donors. It is used to treat immune system problems, thrombocytopenia, and Kawasaki syndrome. This medicine  may be used for other purposes; ask your health care provider or pharmacist if you have questions. This medicine may be used for other purposes; ask your health care provider or pharmacist if you have questions. COMMON BRAND NAME(S): ASCENIV, Baygam, BIVIGAM, Carimune, Carimune NF, cutaquig, Cuvitru, Flebogamma, Flebogamma  DIF, GamaSTAN, GamaSTAN S/D, Gamimune N, Gammagard, Gammagard S/D, Gammaked, Gammaplex, Gammar-P IV, Gamunex, Gamunex-C, Hizentra, Iveegam, Iveegam EN, Octagam, Panglobulin, Panglobulin NF, panzyga, Polygam S/D, Privigen, Sandoglobulin, Venoglobulin-S, Vigam, Vivaglobulin, Xembify What should I tell my care team before I take this medication? They need to know if you have any of these conditions: diabetes extremely low or no immune antibodies in the blood heart disease history of blood clots hyperprolinemia infection in the blood, sepsis kidney disease recently received or scheduled to receive a vaccination an unusual or allergic reaction to human immune globulin, albumin, maltose, sucrose, other medicines, foods, dyes, or preservatives pregnant or trying to get pregnant breast-feeding How should I use this medication? This medicine is for injection into a muscle or infusion into a vein or skin. It is usually given by a health care professional in a hospital or clinic setting. In rare cases, some brands of this medicine might be given at home. You will be taught how to give this medicine. Use exactly as directed. Take your medicine at regular intervals. Do not take your medicine more often than directed. Talk to your pediatrician regarding the use of this medicine in children. While this drug may be prescribed for selected conditions, precautions do apply. Overdosage: If you think you have taken too much of this medicine contact a poison control center or emergency room at once. NOTE: This medicine is only for you. Do not share this medicine with others. Overdosage: If you think you have taken too much of this medicine contact a poison control center or emergency room at once. NOTE: This medicine is only for you. Do not share this medicine with others. What if I miss a dose? It is important not to miss your dose. Call your doctor or health care professional if you are unable to keep an  appointment. If you give yourself the medicine and you miss a dose, take it as soon as you can. If it is almost time for your next dose, take only that dose. Do not take double or extra doses. What may interact with this medication? aspirin and aspirin-like medicines cisplatin cyclosporine medicines for infection like acyclovir, adefovir, amphotericin B, bacitracin, cidofovir, foscarnet, ganciclovir, gentamicin, pentamidine, vancomycin NSAIDS, medicines for pain and inflammation, like ibuprofen or naproxen pamidronate vaccines zoledronic acid This list may not describe all possible interactions. Give your health care provider a list of all the medicines, herbs, non-prescription drugs, or dietary supplements you use. Also tell them if you smoke, drink alcohol, or use illegal drugs. Some items may interact with your medicine. This list may not describe all possible interactions. Give your health care provider a list of all the medicines, herbs, non-prescription drugs, or dietary supplements you use. Also tell them if you smoke, drink alcohol, or use illegal drugs. Some items may interact with your medicine. What should I watch for while using this medication? Your condition will be monitored carefully while you are receiving this medicine. This medicine is made from pooled blood donations of many different people. It may be possible to pass an infection in this medicine. However, the donors are screened for infections and all products are tested for HIV and hepatitis. The medicine is treated  to kill most or all bacteria and viruses. Talk to your doctor about the risks and benefits of this medicine. Do not have vaccinations for at least 14 days before, or until at least 3 months after receiving this medicine. What side effects may I notice from receiving this medication? Side effects that you should report to your doctor or health care professional as soon as possible: allergic reactions like skin  rash, itching or hives, swelling of the face, lips, or tongue blue colored lips or skin breathing problems chest pain or tightness fever signs and symptoms of aseptic meningitis such as stiff neck; sensitivity to light; headache; drowsiness; fever; nausea; vomiting; rash signs and symptoms of a blood clot such as chest pain; shortness of breath; pain, swelling, or warmth in the leg signs and symptoms of hemolytic anemia such as fast heartbeat; tiredness; dark yellow or brown urine; or yellowing of the eyes or skin signs and symptoms of kidney injury like trouble passing urine or change in the amount of urine sudden weight gain swelling of the ankles, feet, hands Side effects that usually do not require medical attention (report to your doctor or health care professional if they continue or are bothersome): diarrhea flushing headache increased sweating joint pain muscle cramps muscle pain nausea pain, redness, or irritation at site where injected tiredness This list may not describe all possible side effects. Call your doctor for medical advice about side effects. You may report side effects to FDA at 1-800-FDA-1088. This list may not describe all possible side effects. Call your doctor for medical advice about side effects. You may report side effects to FDA at 1-800-FDA-1088. Where should I keep my medication? Keep out of the reach of children. This drug is usually given in a hospital or clinic and will not be stored at home. In rare cases, some brands of this medicine may be given at home. If you are using this medicine at home, you will be instructed on how to store this medicine. Throw away any unused medicine after the expiration date on the label. NOTE: This sheet is a summary. It may not cover all possible information. If you have questions about this medicine, talk to your doctor, pharmacist, or health care provider.  2023 Elsevier/Gold Standard (2019-03-22 00:00:00)

## 2022-04-09 ENCOUNTER — Ambulatory Visit (INDEPENDENT_AMBULATORY_CARE_PROVIDER_SITE_OTHER): Payer: BC Managed Care – PPO | Admitting: Pulmonary Disease

## 2022-04-09 ENCOUNTER — Encounter: Payer: Self-pay | Admitting: Pulmonary Disease

## 2022-04-09 VITALS — BP 132/70 | HR 87 | Temp 97.9°F | Ht 63.0 in | Wt 178.6 lb

## 2022-04-09 DIAGNOSIS — J45998 Other asthma: Secondary | ICD-10-CM

## 2022-04-09 DIAGNOSIS — J449 Chronic obstructive pulmonary disease, unspecified: Secondary | ICD-10-CM | POA: Diagnosis not present

## 2022-04-09 DIAGNOSIS — R918 Other nonspecific abnormal finding of lung field: Secondary | ICD-10-CM | POA: Diagnosis not present

## 2022-04-09 DIAGNOSIS — R0602 Shortness of breath: Secondary | ICD-10-CM

## 2022-04-09 DIAGNOSIS — J4489 Other specified chronic obstructive pulmonary disease: Secondary | ICD-10-CM

## 2022-04-09 DIAGNOSIS — K219 Gastro-esophageal reflux disease without esophagitis: Secondary | ICD-10-CM

## 2022-04-09 MED ORDER — PANTOPRAZOLE SODIUM 40 MG PO TBEC: 40.0000 mg | DELAYED_RELEASE_TABLET | Freq: Every day | ORAL | 3 refills | Status: DC

## 2022-04-09 MED ORDER — ARNUITY ELLIPTA 100 MCG/ACT IN AEPB: 1.0000 | INHALATION_SPRAY | Freq: Every day | RESPIRATORY_TRACT | 6 refills | Status: DC

## 2022-04-09 NOTE — Patient Instructions (Signed)
I prescribed a different medication for your reflux that will not interfere with your Plavix.  The omeprazole (Prilosec) can interfere with the Plavix.  Let us know if you have any trouble getting it.  We are getting breathing tests.  I have also placed you on an inhaler that you will take 1 puff daily this is an anti-inflammatory for the lungs.  You can still continue to use your albuterol as needed.  Your follow-up CT scan of the chest has been ordered.  They will call you closer to the due date to schedule.  We will see you in follow-up in 3 months time call sooner should any new problems arise.

## 2022-04-09 NOTE — Progress Notes (Signed)
Subjective:    Patient ID: Tina Page, female    DOB: 1956-06-09, 66 y.o.   MRN: [id number] Patient Care Team: Frazier Richards, MD as PCP - General (Family Medicine) Leonel Ramsay, MD (Infectious Diseases) Telford Nab, RN as Registered Nurse Lucilla Lame, MD as Consulting Physician (Gastroenterology) Tsosie Billing, MD as Consulting Physician (Infectious Diseases) Minna Merritts, MD as Consulting Physician (Cardiology)  Chief Complaint  Patient presents with   Follow-up    CT 03/05/2022--no current sx.    HPI Tina Page is a 66 year old female, former smoker quit in 2017 (63-pack-year history). 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 Previously followed by Dr. Mortimer Fries (last seen 2019), last seen by me on 06 Jan 2022.  No exacerbations since her prior visit.     Patient presents today for follow-up prior PFTs performed in 2019, no PFTs since then.  She is to have a nonproductive cough and some shortness of breath on exertion. She uses albuterol at least once a day due to shortness of breath and wheezing, this is effective.  She has had significant bronchodilator response noted on her prior PFTs.  She has not had any fevers, chills or sweats since her last visit.  Cough has no sputum production, there is no hemoptysis.  She does not endorse any other respiratory symptomatology today.  Her dyspnea is pretty much at baseline.  She has had some symptoms of GERD after discontinuing omeprazole due to potential interaction with Plavix.  She is on Plavix for iliac stents placed in the past.   She has had evanescent pulmonary nodules that have been followed closely by the lung cancer screening program.  Also prior electromagnetic navigation bronchoscopy in April  2019 by Dr. Mortimer Fries with biopsy of the right middle lobe nodule that showed organizing pneumonia and chronic bronchitis.  Her most recent CT chest was performed 05 March 2022 and was read as a lung RADS 3.  She will need 47-monthshort-term follow-up.  DATA PFTS: 11/18/2017 PFTs: FEV1 1.22 L or 51% predicted, FVC 1.88 L or 63% predicted, FEV1/FVC 65%, no bronchodilator response.  Lung volumes normal, diffusion capacity moderate to severely impaired. 07/07/2022 PFTs: FEV1 0.86 L or 36% predicted, FVC 1.34 L or 43% predicted, FEV1/FVC 64%, there is significant bronchodilator response with both response in FEV1 and FVC improvement.  No hyperinflation, air trapping evident.  Moderate to severe diffusion capacity impairment.  There is modest decline in lung function when compared to prior.  Consistent with asthma/COPD overlap. IMAGING: 02/16/18 CT chest wo contrast- showed almost complete resolution of spiculated nodules RML. Stable 553mright parahilar nodules since March 2019 but not present in 2017.  06/27/19 CT chest wo contrast-  previously described nodule of the right middle lobe has almost completely resolved, with residual linear scarring. There is persistent paramedian consolidation of the medial right middle lobe and lingula generally in keeping with atypical infection, particularly atypical mycobacterium. Interval increase in size of a small nodule of the right middle lobe, now measuring 4 mm, previously 2 mm Unchanged tiny biapical pulmonary nodules, benign sequelae of nonspecific infection or inflammation, possibly related cigarette smoking, atypical infection, or other inhalational, inflammatory lung disease. 12/27/19 CT Chest wo contrast- stable sub-centimeter bilateral pulmonary nodules consistent with benign etiology. Stable paramedian consolidation RML and lingula. New airspace disease inferior RML with a few  adjacent subcenterimeter nodules. Overall, consistent with waxing and waning atypical  infectious process such as MAI 07/24/2020 chest LDCT: Lung RADS 2, benign appearance or behavior.  Moderate centrilobular emphysema, multiple nonsolid and solid tiny nodules largest of which was 4.2 mm 12/02/2021 LDCT chest: New right middle lobe pulmonary nodule with aggressive appearance categorized as lung RADS 4 BS, suspicious.  Mild diffuse bronchial wall thickening and mild centrilobular and paraseptal emphysema imaging findings suggestive of underlying COPD 12/22/2021 PET/CT: The previously noted right middle pulmonary nodule appears to be resolving, no FDG avidity noted.  Consistent with infectious/inflammatory etiology.  Recommend continued radiographic follow-up to resolution.  Hypermetabolic activity in the intercostal muscles and crura of the diaphragm reflecting physiologic activity related to ventilation 03/05/2022 LDCT chest: Lung RADS 3, probably benign findings, new 5.9 mm right lower lobe nodule, short-term follow-up 6 months recommended.  Continued decrease in the size of the right middle lobe nodule.  Review of Systems A 10 point review of systems was performed and it is as noted above otherwise negative.   Patient Active Problem List   Diagnosis Date Noted   Anemia in chronic kidney disease (CKD) 03/30/2022   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   Preseptal cellulitis of right eye 08/29/2021   Leukopenia 11/22/2020   Herpes zoster without complication    Left leg pain 11/17/2020   Diarrhea 09/01/2020   Clostridium difficile diarrhea 08/26/2020   Lupus ([place]) 08/11/2020   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[place]) 08/11/2020   Chronic viral hepatitis B without delta agent and without coma ([place]) 08/11/2020   Papular rash 08/05/2020   Bacteremia due to Escherichia coli 07/17/2020   UTI (urinary tract infection) 07/16/2020   Hypothyroidism    Fever    Encounter for antineoplastic immunotherapy 07/15/2020   Goals of care, counseling/discussion 06/19/2020   Abnormal  CT of the chest 06/04/2020   Oral candidiasis 06/04/2020   Former smoker 06/04/2020   Pulmonary nodules 12/28/2019   Stage 3a chronic kidney disease (CKD) ([place]) 05/15/2019   Postop check 04/24/2019   Paronychia of great toe of right foot 04/17/2019   Ingrowing nail 04/17/2019   Medication monitoring encounter 03/25/2018   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 coli 02/18/2018   Aortic atherosclerosis ([place]) 12/13/2017   Shortness of breath 12/13/2017   Nodule of middle lobe of right lung 11/23/2017   Renal insufficiency 11/17/2017   Autoimmune hemolytic anemia ([place]) 11/03/2017   Elevated liver function tests 11/03/2017   Elevated uric acid in blood 11/03/2017   Low serum cortisol level 01/04/2017   Elevated alkaline phosphatase level 10/25/2016   Oral herpes simplex infection 10/25/2016   Current chronic use of systemic steroids 10/23/2016   Impetigo 10/23/2016   Thrombocytopenia ([place]) 10/04/2016   B12 deficiency 08/06/2016   Symptomatic anemia 07/21/2016   Atherosclerosis of native arteries of extremity with intermittent claudication ([place]) 07/20/2016   PAD (peripheral artery disease) ([place]) 07/12/2016   Post PTCA 07/12/2016   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[place]) 07/12/2016   Autoimmune hemolytic anemia, cold antibody type ([place]) 07/12/2016   Hepatitis B core antibody positive 07/12/2016   Tobacco abuse 07/12/2016   Leg pain 07/07/2016   Essential hypertension 07/07/2016   HLD (hyperlipidemia) 07/07/2016   COPD (chronic obstructive pulmonary disease) ([place]) 07/07/2016   Acute kidney injury superimposed on CKD ([place]) 07/07/2016   Heme positive stool 01/29/2015   Social History   Tobacco Use   Smoking status:  Former    Packs/day: 1.25    Years: 47.00    Total pack years: 58.75    Types: Cigarettes    Quit date: 07/11/2016    Years since quitting: 5.7   Smokeless tobacco:  Never  Substance Use Topics   Alcohol use: No   Allergies  Allergen Reactions   Ciprofloxacin Swelling    Facial swelling following single oral dose.    Codeine Anaphylaxis   Nsaids Other (See Comments)    Patient has Hep C.    Current Meds  Medication Sig   albuterol (VENTOLIN HFA) 108 (90 Base) MCG/ACT inhaler SMARTSIG:2 Puff(s) By Mouth Every 4 Hours PRN   allopurinol (ZYLOPRIM) 100 MG tablet TAKE 1 TABLET BY MOUTH EVERY DAY   clopidogrel (PLAVIX) 75 MG tablet Take 1 tablet (75 mg total) by mouth daily.   cyanocobalamin 500 MCG tablet Take 500 mcg by mouth daily.   folic acid (FOLVITE) 1 MG tablet TAKE 1 TABLET (1 MG TOTAL) BY MOUTH DAILY. (Patient taking differently: Take 1 mg by mouth every Monday, Wednesday, and Friday.)   hydroxychloroquine (PLAQUENIL) 200 MG tablet Take 200 mg by mouth 2 (two) times daily.   levothyroxine (SYNTHROID, LEVOTHROID) 75 MCG tablet Take 75 mcg by mouth daily before breakfast.    lisinopril-hydrochlorothiazide (ZESTORETIC) 20-25 MG tablet Take 1 tablet by mouth daily.   Omeprazole 20 MG TBDD Take 20 mg by mouth daily as needed (acid reflux symptoms).   predniSONE (DELTASONE) 5 MG tablet Take 1 tablet (5 mg total) by mouth daily with breakfast.   prochlorperazine (COMPAZINE) 10 MG tablet Take 1 tablet (10 mg total) by mouth every 6 (six) hours as needed for nausea or vomiting.   Immunization History  Administered Date(s) Administered   HiB (PRP-T) 04/14/2018   Influenza Split 05/29/2011, 05/25/2013   Influenza,inj,Quad PF,6+ Mos 05/17/2018, 07/02/2020   Influenza,inj,quad, With Preservative 05/17/2014   Influenza-Unspecified 05/17/2018, 05/16/2019   Meningococcal B, OMV 07/06/2019   Meningococcal Mcv4o 07/06/2019, 07/02/2020   Moderna Sars-Covid-2 Vaccination 11/14/2019, 12/08/2019   Pneumococcal Conjugate-13 04/14/2018   Pneumococcal Polysaccharide-23 07/22/2016   Tdap 05/29/2011, 10/18/2014, 11/18/2020       Objective:   Physical  Exam BP 132/70 (BP Location: Left Arm, Cuff Size: Large)   Pulse 87   Temp 97.9 F (36.6 C) (Temporal)   Ht 5' 3"$  (1.6 m)   Wt 178 lb 9.6 oz (81 kg)   SpO2 96%   BMI 31.64 kg/m  GENERAL: Overweight woman, no acute distress, fully ambulatory.  No conversational dyspnea. HEAD: Normocephalic, atraumatic.  EYES: Pupils equal, round, reactive to light.  No scleral icterus.  MOUTH: Poor dentition, oral mucosa moist.  No thrush. NECK: Supple. No thyromegaly. Trachea midline. No JVD.  No adenopathy. PULMONARY: Good air entry bilaterally.  Faint end expiratory wheezes noted. CARDIOVASCULAR: S1 and S2. Regular rate and rhythm.  No rubs, murmurs or gallops heard. ABDOMEN: Obese, otherwise benign. MUSCULOSKELETAL: No joint deformity, no clubbing, no edema.  NEUROLOGIC: No overt focal deficit, no gait disturbance, speech is fluent. SKIN: Intact,warm,dry.  Multiple petechiae noted.   PSYCH: Mood and behavior normal.       Assessment & Plan:     ICD-10-CM   1. Persistent asthma with undetermined severity  J45.998 Pulmonary Function Test [facility] Only   Obtain PFTs Continue as needed albuterol Trial of Arnuity Ellipta 100, 1 puff daily    2. Pulmonary nodules  R91.8    Following through lung cancer screening program Negative PET 22 December 2021 CT follow-up July 2023 read as lung RADS 3 Short-term follow-up CT, scheduled    3. Shortness of breath  R06.02    PFTs should help clarify Suspect related to asthma vs COPD    4. Gastroesophageal reflux disease, unspecified whether esophagitis present  K21.9    Trial of PPI Antireflux measures     Orders Placed This Encounter  Procedures   Pulmonary Function Test [facility] Only    Standing Status:   Future    Standing Expiration Date:   04/10/2023    Scheduling Instructions:     Next available.    Order Specific Question:   Full PFT: includes the following: basic spirometry, spirometry pre & post bronchodilator, diffusion capacity (DLCO), lung  volumes    Answer:   Full PFT   Meds ordered this encounter  Medications   Fluticasone Furoate (ARNUITY ELLIPTA) 100 MCG/ACT AEPB    Sig: Inhale 1 puff into the lungs daily.    Dispense:  30 each    Refill:  6   pantoprazole (PROTONIX) 40 MG tablet    Sig: Take 1 tablet (40 mg total) by mouth daily.    Dispense:  30 tablet    Refill:  3   The patient had recurrent GERD symptoms while being off omeprazole for concern of interference with Plavix.  We have switched her to pantoprazole.  We have ordered pulmonary function testing.  We will give her a trial of Arnuity Ellipta.  Her follow-up CT chest is being ordered through the lung cancer screening program.  Patient will be notified of date.  Will see her in follow-up in 3 months time she is to contact us prior to that time should any new difficulties arise.  Renold Don, MD Advanced Bronchoscopy PCCM [place] [facility]    *This note was dictated using voice recognition software/Dragon.  Despite best efforts to proofread, errors can occur which can change the meaning. Any transcriptional errors that result from this process are unintentional and may not be fully corrected at the time of dictation.

## 2022-04-17 ENCOUNTER — Telehealth: Payer: Self-pay | Admitting: *Deleted

## 2022-04-17 NOTE — Telephone Encounter (Signed)
Patient called reporting that she has a nose bleed that she cannot get to stop. She states the blood id dripping from her nose. She is asking what to do. Please advise

## 2022-04-17 NOTE — Telephone Encounter (Signed)
Patient advised per Alease Medina, NP "unfortunately I'm not able to manage beyond trying afrin which she can do at home. Would need Urgent care for packing if that doesn't work". Patient in agreement with this plan

## 2022-05-05 ENCOUNTER — Ambulatory Visit: Payer: BC Managed Care – PPO | Admitting: Oncology

## 2022-05-05 ENCOUNTER — Other Ambulatory Visit: Payer: BC Managed Care – PPO

## 2022-05-14 ENCOUNTER — Inpatient Hospital Stay: Payer: BC Managed Care – PPO

## 2022-05-14 ENCOUNTER — Encounter: Payer: Self-pay | Admitting: Oncology

## 2022-05-14 ENCOUNTER — Inpatient Hospital Stay: Payer: BC Managed Care – PPO | Admitting: Oncology

## 2022-05-14 ENCOUNTER — Inpatient Hospital Stay: Payer: BC Managed Care – PPO | Attending: Oncology

## 2022-05-14 VITALS — BP 127/55 | HR 71 | Temp 97.8°F | Resp 20 | Wt 177.0 lb

## 2022-05-14 DIAGNOSIS — D696 Thrombocytopenia, unspecified: Secondary | ICD-10-CM | POA: Diagnosis not present

## 2022-05-14 DIAGNOSIS — D631 Anemia in chronic kidney disease: Secondary | ICD-10-CM | POA: Diagnosis present

## 2022-05-14 DIAGNOSIS — Z7952 Long term (current) use of systemic steroids: Secondary | ICD-10-CM | POA: Diagnosis not present

## 2022-05-14 DIAGNOSIS — D591 Autoimmune hemolytic anemia, unspecified: Secondary | ICD-10-CM

## 2022-05-14 DIAGNOSIS — N1831 Chronic kidney disease, stage 3a: Secondary | ICD-10-CM

## 2022-05-14 DIAGNOSIS — Z87891 Personal history of nicotine dependence: Secondary | ICD-10-CM | POA: Insufficient documentation

## 2022-05-14 DIAGNOSIS — N1832 Chronic kidney disease, stage 3b: Secondary | ICD-10-CM | POA: Diagnosis not present

## 2022-05-14 DIAGNOSIS — D5911 Warm autoimmune hemolytic anemia: Secondary | ICD-10-CM | POA: Insufficient documentation

## 2022-05-14 DIAGNOSIS — R918 Other nonspecific abnormal finding of lung field: Secondary | ICD-10-CM | POA: Diagnosis not present

## 2022-05-14 DIAGNOSIS — D5912 Cold autoimmune hemolytic anemia: Secondary | ICD-10-CM

## 2022-05-14 LAB — CBC WITH DIFFERENTIAL/PLATELET
Abs Immature Granulocytes: 0.02 10*3/uL (ref 0.00–0.07)
Basophils Absolute: 0 10*3/uL (ref 0.0–0.1)
Basophils Relative: 1 %
Eosinophils Absolute: 0.1 10*3/uL (ref 0.0–0.5)
Eosinophils Relative: 3 %
HCT: 26 % — ABNORMAL LOW (ref 36.0–46.0)
Hemoglobin: 8.1 g/dL — ABNORMAL LOW (ref 12.0–15.0)
Immature Granulocytes: 0 %
Lymphocytes Relative: 24 %
Lymphs Abs: 1.2 10*3/uL (ref 0.7–4.0)
MCH: 31 pg (ref 26.0–34.0)
MCHC: 31.2 g/dL (ref 30.0–36.0)
MCV: 99.6 fL (ref 80.0–100.0)
Monocytes Absolute: 0.3 10*3/uL (ref 0.1–1.0)
Monocytes Relative: 6 %
Neutro Abs: 3.4 10*3/uL (ref 1.7–7.7)
Neutrophils Relative %: 66 %
Platelets: 144 10*3/uL — ABNORMAL LOW (ref 150–400)
RBC: 2.61 MIL/uL — ABNORMAL LOW (ref 3.87–5.11)
RDW: 16.5 % — ABNORMAL HIGH (ref 11.5–15.5)
WBC: 5.1 10*3/uL (ref 4.0–10.5)
nRBC: 0 % (ref 0.0–0.2)

## 2022-05-14 LAB — IRON AND TIBC
Iron: 82 ug/dL (ref 28–170)
Saturation Ratios: 26 % (ref 10.4–31.8)
TIBC: 314 ug/dL (ref 250–450)
UIBC: 232 ug/dL

## 2022-05-14 LAB — COMPREHENSIVE METABOLIC PANEL
ALT: 15 U/L (ref 0–44)
AST: 23 U/L (ref 15–41)
Albumin: 3.9 g/dL (ref 3.5–5.0)
Alkaline Phosphatase: 45 U/L (ref 38–126)
Anion gap: 6 (ref 5–15)
BUN: 38 mg/dL — ABNORMAL HIGH (ref 8–23)
CO2: 27 mmol/L (ref 22–32)
Calcium: 9.1 mg/dL (ref 8.9–10.3)
Chloride: 105 mmol/L (ref 98–111)
Creatinine, Ser: 2.36 mg/dL — ABNORMAL HIGH (ref 0.44–1.00)
GFR, Estimated: 22 mL/min — ABNORMAL LOW (ref >60)
Glucose, Bld: 92 mg/dL (ref 70–99)
Potassium: 3.4 mmol/L — ABNORMAL LOW (ref 3.5–5.1)
Sodium: 138 mmol/L (ref 135–145)
Total Bilirubin: 1 mg/dL (ref 0.3–1.2)
Total Protein: 7.1 g/dL (ref 6.5–8.1)

## 2022-05-14 LAB — LACTATE DEHYDROGENASE: LDH: 255 U/L — ABNORMAL HIGH (ref 98–192)

## 2022-05-14 LAB — FERRITIN: Ferritin: 329 ng/mL — ABNORMAL HIGH (ref 11–307)

## 2022-05-14 MED ORDER — EPOETIN ALFA 10000 UNIT/ML IJ SOLN
30000.0000 [IU] | Freq: Once | INTRAMUSCULAR | Status: AC
  Administered 2022-05-14: 30000 [IU] via SUBCUTANEOUS
  Filled 2022-05-14: qty 3

## 2022-05-14 MED ORDER — SODIUM CHLORIDE 0.9 % IV SOLN
INTRAVENOUS | Status: DC
  Filled 2022-05-14 (×2): qty 250

## 2022-05-14 MED ORDER — PREDNISONE 20 MG PO TABS: 20.0000 mg | ORAL_TABLET | Freq: Every day | ORAL | 0 refills | Status: DC

## 2022-05-14 NOTE — Addendum Note (Signed)
Addended by: Earlie Server on: 05/14/2022 08:42 PM   Modules accepted: Orders

## 2022-05-14 NOTE — Assessment & Plan Note (Addendum)
LDH is normal, adequate B12 and folate level, normal iron panel.  There is increased reticulocyte %-5%.  Cold agglutinin titer negative. Bilirubin is normal. 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  12/22/21 Negative PET scan  Status post IVIG 45m/kg x 2 doses.  CBC weekly. hemoglobin is further decreased.  Recommend patient to increase prednisone to 20 mg daily. Possible additional IVIG doses if kidney function improves  I discussed with her about option of re-challenge with Rituximab, however she will need to take Entecavir. Patient has CKD, will need renal dosing of Entecavir.  Recommend patient to reestablish care with infectious disease for further evaluation. Consider bone marrow biopsy prior to reinitiating rituximab.

## 2022-05-14 NOTE — Assessment & Plan Note (Signed)
Iron panel showed adequate ferritin level.  Proceed with erythropoietin replacement therapy.  30,000 units Epogen.

## 2022-05-14 NOTE — Progress Notes (Signed)
Hematology/Oncology Progress note [phone] [phone]      Clinic Day:  05/14/2022  ASSESSMENT & PLAN:   Autoimmune hemolytic anemia (HCC) LDH is normal, adequate B12 and folate level, normal iron panel.  There is increased reticulocyte %-5%.  Cold agglutinin titer negative. Bilirubin is normal. 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  Negative PET scan Status post IVIG 48m/kg x 2 doses.  CBC weekly. hemoglobin is further decreased.  Recommend patient to increase prednisone to 20 mg daily. Possible additional IVIG doses if kidney function improves Consider bone marrow biopsy.  I discussed with her about option of re-challenge with Rituximab, however she will need to take Entecavir. Patient has CKD, will need renal dosing of Entecavir.  Recommend patient to reestablish care with infectious disease for further evaluation.     Anemia in chronic kidney disease (CKD) Iron panel showed adequate ferritin level.  Proceed with erythropoietin replacement therapy.  30,000 units Epogen.  Thrombocytopenia (HCC) Stable. Continue to monitor  Stage 3a chronic kidney disease (CKD) [place] Encourage oral hydration.  Avoid nephrotoxins.  Creatinine has significantly increased.  Recommend 1 L of IV fluid normal saline today. Repeat BMP in 1 week.  Orders Placed This Encounter  Procedures   CBC with Differential/Platelet    Standing Status:   Future    Standing Expiration Date:   05/15/2023   Comprehensive metabolic panel    Standing Status:   Future    Standing Expiration Date:   05/14/2023   Flow cytometry panel-leukemia/lymphoma work-up    Standing Status:   Future    Standing Expiration Date:   05/15/2023   CBC with Differential/Platelet    Standing Status:   Future    Standing Expiration Date:   05/15/2023   CBC with  Differential/Platelet    Standing Status:   Future    Standing Expiration Date:   05/15/2023   Comprehensive metabolic panel    Standing Status:   Future    Standing Expiration Date:   05/14/2023   Sample to Blood Bank    Standing Status:   Future    Standing Expiration Date:   05/15/2023   Sample to Blood Bank    Standing Status:   Future    Standing Expiration Date:   05/15/2023   Sample to Blood Bank    Standing Status:   Future    Standing Expiration Date:   05/15/2023   Follow up  IVF NS today Retacrit today 9/20  lab scbc hold tubes, BMP, flowcytometry,  9/27 labs cbc hold tubes 3 weeks lab MD cbc cmp hold tube BMP retacrit   All questions were answered. The patient knows to call the clinic with any problems, questions or concerns.  ZEarlie Server MD, PhD [facility] H[facility] Oncology 05/14/2022   Chief Complaint: Tina COUSINEAUis a 66y.o. 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  Reticulocyte count was 11.3%.  Ferritin was 562.  Iron studies revealed a saturation of 20% and a TIBC of 214 (low).  B12 was 231 (low normal).  Folate was 42.   Peripheral smear  revealed rouleaux formation.    Additional testing included the following + studies:  hepatitis C antibody, hepatitis B core antibody, CMV IgM, and EBV VCA (IgM and IgG).  Hepatitis B by PCR was negative.  Hepatitis C RNA was negative.  Mycoplasma pneumonia IgM was negative.  Reticulocyte count was 11.3% (high) indicating appropriate marrow response.  C3 and C4 were normal.  Cold agglutinin titer was negative x 2.  Negative studies included:  ANA, hepatitis B surface antigen, SPEP, and free light  chain ratio.   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  MMA was initially 330 (normal).  Repeat MMA was elevated on 08/01/2016 confirming B12 deficiency.  Hepatitis B surface antibody was positive and hepatitis B surface antigen was negative on 08/06/2016.   07/07/2016  Chest, abdomen, and pelvis CT angiogram on revealed moderate diffuse atherosclerotic vascular disease of the abdominal aorta with severe stenosis of the left common iliac artery with suspected short segment occlusion. There was no adenopathy.  Spleen was normal.   04/15/2017 Abd US revealed a normal spleen and sludge in the gallbladder.  The liver was echogenic consistent fatty infiltration and/or hepatocellular disease.  For hemolytic anemia treatments, in 2017, She has received multiple units of  warmed PRBCs to date.  08/01/2016-03/12/2017, steroid treatment with prednisone.  She is also on folic acid 1 mg daily. Borderline low vitamin B12 level on 07/09/2016.  Patient has been on oral vitamin B12 supplementation.  10/12/2017  positive warm autoantibody.  LDH and bilirubin were normal.   She began prednisone 10 mg on 12/07/2017 (discontinued 03/2018).  She received Rituxan weekly x 4 (last dose 06/16/2018).  Entecavir was discontinued on 07/06/2019.  06/12/2020 developed recurrent anemia.  Hematocrit was 27.6, hemoglobin 8.4, MCV 104.5, platelets 138,000, WBC 3,400 (ANC 2,300). Creatinine was 1.17 (CrCl 50 ml/min).  Positive warm autoantibody  Reticulocyte count was 7.1%.  LDH was 204 (98-192).  06/16/2020, began prednisone 1 mg/kilogram treatments, 07/24/2020, prednisone was tapered down to 20 mg.  She received Rituxan 1000 mg on day 1 and 15 (07/15/2020 and 07/29/2020).  She began entecavir on 06/28/2020.  Prednisone was tapered off.  She was on Septra for PCP prophylaxis which is now currently on hold    Other medical problems 07/10/2016.  She underwent PTCA and stent placement in right and left  common iliac arteries and left external iliac artery. She is on Plavix.   03/19/2015 EGD revealed gastritis in the body and antrum.  Colonoscopy revealed one hyperplastic polyp.  Repeat EGD on 07/08/2016 was normal.  No evidence of bleeding.  History of she developed peri-oral and intranasal herpes simplex-1.  She was treated with valacyclovir and doxycycline on 10/19/2016.   10/26/2017. She developed flu like symptoms.  Symptoms included cough, myalgias, and fever (tmax unknown).  She was prescribed Mucinex, Tamiflu, and amoxicillin.  She only took amoxicillin x 5 days.  She developed increased liver function tests on 11/02/2017. CMV IgG was positive.  EBV VCA IgG, NA IgG, early antigen antibody IgG were elevated.  EBV VCA IgM was < 36.  Testing was c/w a convalescence/past infection or reactivated infection.  LFTs normalized on 11/15/2017.  Smooth muscle antibody was 39 (high) on 11/10/2017 and 34 (high) on 11/30/2017.     Chest CT on 11/16/2017 revealed a 2.2 x 2.0 x 2.4 spiculated RIGHT middle lobe mass.  There was tiny nonspecific upper lobe pulmonary nodules greater on RIGHT, largest 3 mm, of uncertain etiology.  There was additional  8 x 8 mm opacity in the posterior sulcus of the RIGHT lower lobe. PET scan on 11/22/2017 revealed a 2 cm hypermetabolic right middle lobe lung mass (SUV 3.4) consistent  with primary lung neoplasm.  There were no findings for mediastinal/hilar lymphadenopathy or metastatic disease.  There were areas of hypermetabolism involving the left oblique abdominal muscles and the anorectal junction. PFTs on 11/18/2017 revealed an FEV1 of 1.22 liters (51%).  DLCO adj was 6.8 mL/mmHg/min (32%).   ENB done on 12/07/2017. Cytology was negative for malignancy. Pathology demonstrated organizing pneumonia and chronic bronchitis. Pathologist commented that organizing pneumonia spanned about 2 mm in one fragment. Cases of focal organizing pneumonia with hypermetabolism on FDG-PET have  been described, but changes of this type can also be adjacent to a neoplasm. Patient prescribed a daily dose of Prednisone 10 mg   07/24/2020  repeat  chest CT on  revealed Lung-RADS 2, benign appearance or behavior. There was emphysema, aortic atherosclerosis, and coronary artery calcifications.  diarrhea.  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  She received azithromycin x 3 days.  She received ciprofloxacin x 10 days without improvement.  She has seen ID in [place].  She began Bactrim on 03/04/2018 (discontinued on 03/11/2018) secondary to increase in creatine.   Chronic kidney disease Urinalysis on 11/15/2017 revealed revealed no hemoglobin, bilirubin or active sediment.   Liver function tests increased on 02/15/2019.  Work-up on 02/16/2019 revealed hepatitis B E antibody positive.  Negative studies included: hepatitis A total antibody, hepatitis A IgM, hepatitis B surface antigen, hepatitis B E antigen.  Hepatitis B DNA was not detected.  Folate was 80.5 and vitamin B12 was 443.  She has received her vaccinations:  She has completed the PCV13, PPSV23, and HiB.  She received Menvio (quadrivalent meningoccal vaccine) and Bexero (univalent serogroup B meningoccal vaccine) on 07/06/2019.  She was diagnosed with C difficile + diarrhea on 08/12/2020.  She completed a course of oral vancomycin.  Stool was negative for C. difficile on 08/27/2020.  11/17/2020 - 11/20/2020 [facility] admission with left L5 varicella zoster.  Lesion was positive for VZV. She was treated with ceftazidime and a valacyclovir.  She was discharged on 9 days of Valtrex 1 gm 3 times daily followed by 500 mg daily indefinitely for suppression and doxycycline.  History of discoid lupus  #Positive hepatitis B core antibody Previously on entecavir 0.5 mg daily, currently she has finished a course. Dose adjusted based on renal function. CrCl 30-49 ml/min   50% dosing (0.5 mg QOD) CrCl >= 50 ml/min  100% dosing (0.5 mg a day). With her kidney function currently QOD She has completed course of Entecavir and stopped 08/14/2021  # PCP prophylaxis Patient was on Septra DS on Mondays, Wednesdays, and Fridays, which was held due to fluctuated kidney functions.  Currently off Septra.  12/03/2021, CT chest lung cancer screening showed new right middle lobe pulmonary nodule with an aggressive appearance characterized as lung RADS 4 VS.  12/22/2021 PET showed No abnormal FDG avidity associated with the resolving right middle lobe pulmonary nodule now with a 12 mm ground-glass opacity in the area of prior solid nodularity, most consistent with an infectious or inflammatory etiology  INTERVAL HISTORY Tina Page is a 66 y.o. female who has above history reviewed by me today presents for follow up visit  Patient remained on prednisone 28m daily. No new complaints.  Chronic fatigue unchanged. S/p IVIG, tolerated well.  Chronic intermittent diarrhea with certain  food.   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   Elevated liver function tests 11/03/2017   GERD (gastroesophageal reflux disease)    Heme positive stool 01/29/2015   Hepatitis C 07/12/2016   Ab positive, RNA negative   HLD (hyperlipidemia)    Hypertension    Hypothyroidism    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/23/2017   Post PTCA 07/12/2016   iliac stents bilaterally 2017   Thrombocytopenia ([place]) 10/04/2016   Wears dentures    full upper and lower    Past Surgical History:  Procedure Laterality Date   ELECTROMAGNETIC NAVIGATION BROCHOSCOPY N/A 12/07/2017   Procedure: ELECTROMAGNETIC NAVIGATION BRONCHOSCOPY;  Surgeon: Flora Lipps, MD;  Location: [facility] ORS;  Service: Cardiopulmonary;  Laterality: N/A;    ESOPHAGOGASTRODUODENOSCOPY (EGD) WITH PROPOFOL N/A 07/08/2016   Procedure: ESOPHAGOGASTRODUODENOSCOPY (EGD) WITH PROPOFOL;  Surgeon: Manya Silvas, MD;  Location: [facility]COPY;  Service: Endoscopy;  Laterality: N/A;   KNEE SURGERY Right    repair of acl tear   PERIPHERAL VASCULAR CATHETERIZATION N/A 07/10/2016   Procedure: Lower Extremity Angiography;  Surgeon: Katha Cabal, MD;  Location: [place] [facility] LAB;  Service: Cardiovascular;  Laterality: N/A;   PERIPHERAL VASCULAR CATHETERIZATION N/A 07/10/2016   Procedure: Abdominal Aortogram w/Lower Extremity;  Surgeon: Katha Cabal, MD;  Location: [place] [facility] LAB;  Service: Cardiovascular;  Laterality: N/A;   PERIPHERAL VASCULAR CATHETERIZATION  07/10/2016   Procedure: Lower Extremity Intervention;  Surgeon: Katha Cabal, MD;  Location: [place] [facility];  Service: Cardiovascular;;    Family History  Problem Relation Age of Onset   Diabetes Mother    Hypertension Mother    Diabetes Maternal Grandfather    Hypertension Maternal Grandfather    Breast cancer Neg Hx     Social History:  reports that she quit smoking about 5 years ago. Her smoking use included cigarettes. She has a 58.75 pack-year smoking history. She has never used smokeless tobacco. She reports that she does not drink alcohol and does not use drugs.  Former smoker, 58-pack-year smoking history.   She has a daughter, [name] and a daughter named [name].  She lives in [place]. The patient is alone today.  Allergies:  Allergies  Allergen Reactions   Ciprofloxacin Swelling    Facial swelling following single oral dose.    Codeine Anaphylaxis   Nsaids Other (See Comments)    Patient has Hep C.     Current Medications: Current Outpatient Medications  Medication Sig Dispense Refill   albuterol (VENTOLIN HFA) 108 (90 Base) MCG/ACT inhaler SMARTSIG:2 Puff(s) By Mouth Every 4 Hours PRN     allopurinol (ZYLOPRIM) 100 MG tablet TAKE 1 TABLET BY  MOUTH EVERY DAY 30 tablet 5   clopidogrel (PLAVIX) 75 MG tablet Take 1 tablet (75 mg total) by mouth daily. 30 tablet 5   cyanocobalamin 500 MCG tablet Take 500 mcg by mouth daily.     Fluticasone Furoate (ARNUITY ELLIPTA) 100 MCG/ACT AEPB Inhale 1 puff into the lungs daily. 30 each 6   folic acid (FOLVITE) 1 MG tablet TAKE 1 TABLET (1 MG TOTAL) BY MOUTH DAILY. (Patient taking differently: Take 1 mg by mouth every Monday, Wednesday, and Friday.) 90 tablet 1   hydroxychloroquine (PLAQUENIL) 200 MG tablet Take 200 mg by mouth  2 (two) times daily.     levothyroxine (SYNTHROID, LEVOTHROID) 75 MCG tablet Take 75 mcg by mouth daily before breakfast.      lisinopril-hydrochlorothiazide (ZESTORETIC) 20-25 MG tablet Take 1 tablet by mouth daily. 90 tablet 4   pantoprazole (PROTONIX) 40 MG tablet Take 1 tablet (40 mg total) by mouth daily. 30 tablet 3   predniSONE (DELTASONE) 20 MG tablet Take 1 tablet (20 mg total) by mouth daily with breakfast. 30 tablet 0   prochlorperazine (COMPAZINE) 10 MG tablet Take 1 tablet (10 mg total) by mouth every 6 (six) hours as needed for nausea or vomiting. 30 tablet 0   rosuvastatin (CRESTOR) 40 MG tablet Take 1 tablet (40 mg total) by mouth daily. 90 tablet 3   No current facility-administered medications for this visit.    Review of Systems  Constitutional:  Negative for appetite change, chills, fatigue and fever.  HENT:   Negative for hearing loss and voice change.   Eyes:  Negative for eye problems.  Respiratory:  Negative for chest tightness and cough.   Cardiovascular:  Negative for chest pain.  Gastrointestinal:  Negative for abdominal distention, abdominal pain and blood in stool.  Endocrine: Negative for hot flashes.  Genitourinary:  Negative for difficulty urinating and frequency.   Musculoskeletal:  Negative for arthralgias.  Skin:  Negative for itching and rash.  Neurological:  Negative for extremity weakness.  Hematological:  Negative for adenopathy.   Psychiatric/Behavioral:  Negative for confusion. The patient is not nervous/anxious.      Performance status (ECOG): 1  Vital Signs Blood pressure (!) 127/55, pulse 71, temperature 97.8 F (36.6 C), resp. rate 20, weight 177 lb (80.3 kg), SpO2 100 %.    Laboratory findings     Latest Ref Rng & Units 05/14/2022   10:44 AM 03/30/2022   12:59 PM 03/13/2022    8:01 AM  CBC  WBC 4.0 - 10.5 K/uL 5.1  6.6  3.8   Hemoglobin 12.0 - 15.0 g/dL 8.1  9.5  10.3   Hematocrit 36.0 - 46.0 % 26.0  30.3  32.1   Platelets 150 - 400 K/uL 144  127  93       Latest Ref Rng & Units 05/14/2022   10:44 AM 03/30/2022   12:59 PM 03/02/2022    8:45 AM  CMP  Glucose 70 - 99 mg/dL 92  101  92   BUN 8 - 23 mg/dL 38  38  36   Creatinine 0.44 - 1.00 mg/dL 2.36  1.29  1.35   Sodium 135 - 145 mmol/L 138  141  139   Potassium 3.5 - 5.1 mmol/L 3.4  4.2  3.4   Chloride 98 - 111 mmol/L 105  103  103   CO2 22 - 32 mmol/L 27  31  29    Calcium 8.9 - 10.3 mg/dL 9.1  9.0  8.8   Total Protein 6.5 - 8.1 g/dL 7.1  7.1  8.2   Total Bilirubin 0.3 - 1.2 mg/dL 1.0  0.9  0.6   Alkaline Phos 38 - 126 U/L 45  42  38   AST 15 - 41 U/L 23  19  22    ALT 0 - 44 U/L 15  15  17

## 2022-05-14 NOTE — Assessment & Plan Note (Signed)
Stable.       - Continue to monitor

## 2022-05-14 NOTE — Assessment & Plan Note (Addendum)
Encourage oral hydration.  Avoid nephrotoxins.  Creatinine has significantly increased.  Recommend 1 L of IV fluid normal saline today. Repeat BMP in 1 week.

## 2022-05-15 ENCOUNTER — Telehealth: Payer: Self-pay

## 2022-05-15 DIAGNOSIS — D591 Autoimmune hemolytic anemia, unspecified: Secondary | ICD-10-CM

## 2022-05-15 LAB — HAPTOGLOBIN: Haptoglobin: <10 mg/dL — ABNORMAL LOW (ref 37–355)

## 2022-05-15 NOTE — Telephone Encounter (Signed)
Pt informed and is agreeable to bone marrow biopsy.   Request for biopsy faxed to IR scheduling.

## 2022-05-15 NOTE — Telephone Encounter (Signed)
-----  Message from Earlie Server, MD sent at 05/14/2022  8:41 PM EDT ----- Please let patient know that I also recommend bone marrow biopsy for evaluation of underlying marrow disorders. If she agrees, please arrange.  Keep same  follow up appt.

## 2022-05-18 ENCOUNTER — Encounter: Payer: Self-pay | Admitting: Oncology

## 2022-05-18 NOTE — Telephone Encounter (Signed)
Pt scheduled for CT bm bx  Mon 9/25 '@8'$ :30a  Arrive '@7'$ :30a. Pt informed of appt details.

## 2022-05-19 ENCOUNTER — Encounter (HOSPITAL_COMMUNITY): Payer: Self-pay | Admitting: Radiology

## 2022-05-19 ENCOUNTER — Telehealth: Payer: Self-pay

## 2022-05-19 NOTE — Telephone Encounter (Signed)
FMLA forms for pt faxed to [employer] @ [phone]  Original forms will be given to pt.

## 2022-05-20 ENCOUNTER — Ambulatory Visit
Admit: 2022-05-20 | Discharge: 2022-05-21 | Payer: PRIVATE HEALTH INSURANCE | Attending: Student in an Organized Health Care Education/Training Program | Primary: Student in an Organized Health Care Education/Training Program

## 2022-05-20 ENCOUNTER — Inpatient Hospital Stay: Payer: BC Managed Care – PPO

## 2022-05-20 DIAGNOSIS — D5911 Warm autoimmune hemolytic anemia: Secondary | ICD-10-CM | POA: Diagnosis not present

## 2022-05-20 DIAGNOSIS — N1832 Chronic kidney disease, stage 3b: Secondary | ICD-10-CM

## 2022-05-20 DIAGNOSIS — D591 AIHA (autoimmune hemolytic anemia) (CMS-HCC): Principal | ICD-10-CM

## 2022-05-20 DIAGNOSIS — R768 Other specified abnormal immunological findings in serum: Principal | ICD-10-CM

## 2022-05-20 DIAGNOSIS — Z8619 Personal history of other infectious and parasitic diseases: Principal | ICD-10-CM

## 2022-05-20 LAB — COMPREHENSIVE METABOLIC PANEL
ALT: 11 U/L (ref 0–44)
AST: 16 U/L (ref 15–41)
Albumin: 3.7 g/dL (ref 3.5–5.0)
Alkaline Phosphatase: 44 U/L (ref 38–126)
Anion gap: 5 (ref 5–15)
BUN: 27 mg/dL — ABNORMAL HIGH (ref 8–23)
CO2: 32 mmol/L (ref 22–32)
Calcium: 9.3 mg/dL (ref 8.9–10.3)
Chloride: 105 mmol/L (ref 98–111)
Creatinine, Ser: 1.33 mg/dL — ABNORMAL HIGH (ref 0.44–1.00)
GFR, Estimated: 45 mL/min — ABNORMAL LOW (ref >60)
Glucose, Bld: 98 mg/dL (ref 70–99)
Potassium: 3.7 mmol/L (ref 3.5–5.1)
Sodium: 142 mmol/L (ref 135–145)
Total Bilirubin: 0.7 mg/dL (ref 0.3–1.2)
Total Protein: 7 g/dL (ref 6.5–8.1)

## 2022-05-20 LAB — RETIC PANEL
Immature Retic Fract: 30 % — ABNORMAL HIGH (ref 2.3–15.9)
RBC.: 3.08 MIL/uL — ABNORMAL LOW (ref 3.87–5.11)
Retic Count, Absolute: 260 10*3/uL — ABNORMAL HIGH (ref 19.0–186.0)
Retic Ct Pct: 8.4 % — ABNORMAL HIGH (ref 0.4–3.1)
Reticulocyte Hemoglobin: 29.3 pg (ref >27.9)

## 2022-05-20 LAB — CBC WITH DIFFERENTIAL/PLATELET
Abs Immature Granulocytes: 0.1 10*3/uL — ABNORMAL HIGH (ref 0.00–0.07)
Basophils Absolute: 0.1 10*3/uL (ref 0.0–0.1)
Basophils Relative: 1 %
Eosinophils Absolute: 0.1 10*3/uL (ref 0.0–0.5)
Eosinophils Relative: 1 %
HCT: 31.4 % — ABNORMAL LOW (ref 36.0–46.0)
Hemoglobin: 9.6 g/dL — ABNORMAL LOW (ref 12.0–15.0)
Immature Granulocytes: 2 %
Lymphocytes Relative: 13 %
Lymphs Abs: 0.8 10*3/uL (ref 0.7–4.0)
MCH: 31 pg (ref 26.0–34.0)
MCHC: 30.6 g/dL (ref 30.0–36.0)
MCV: 101.3 fL — ABNORMAL HIGH (ref 80.0–100.0)
Monocytes Absolute: 0.3 10*3/uL (ref 0.1–1.0)
Monocytes Relative: 5 %
Neutro Abs: 4.5 10*3/uL (ref 1.7–7.7)
Neutrophils Relative %: 78 %
Platelets: 183 10*3/uL (ref 150–400)
RBC: 3.1 MIL/uL — ABNORMAL LOW (ref 3.87–5.11)
RDW: 17.7 % — ABNORMAL HIGH (ref 11.5–15.5)
WBC: 5.8 10*3/uL (ref 4.0–10.5)
nRBC: 0.3 % — ABNORMAL HIGH (ref 0.0–0.2)

## 2022-05-20 LAB — SAMPLE TO BLOOD BANK

## 2022-05-20 MED ORDER — ENTECAVIR 0.5 MG TABLET
ORAL_TABLET | 1 refills | 0 days | Status: CP
Start: 2022-05-20 — End: ?

## 2022-05-22 ENCOUNTER — Other Ambulatory Visit (HOSPITAL_COMMUNITY): Payer: Self-pay | Admitting: Radiology

## 2022-05-22 DIAGNOSIS — D696 Thrombocytopenia, unspecified: Secondary | ICD-10-CM

## 2022-05-22 NOTE — H&P (Signed)
Chief Complaint: Autoimmune hemolytic anemia. Request is for bone marrow biopsy for further evaluation of treatment options  Referring Physician(s): Yu,Zhou  Supervising Physician: Ruthann Cancer  Patient Status: [facility] - Out-pt  History of Present Illness: Tina Page is a 66 y.o. female 66 y.o. female outpatient. History of hypothyroidism, HTN, HLD, Hep C, GERD, CKD, CAD s/p stent placement (on plavix), discord lupus. Autoimmune hemolytic anemia.. Team is requesting bone marrow biopsy for further evaluation of auto-immunine hemolytic anemia for evaluation of treatment options.   Currently without any significant complaints. Patient alert and laying in bed,calm. Denies any fevers, headache, chest pain, SOB, cough, abdominal pain, nausea, vomiting or bleeding. Return precautions and treatment recommendations and follow-up discussed with the patient  who is agreeable with the plan.    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/23/2017   Post PTCA 07/12/2016   iliac stents bilaterally 2017   Thrombocytopenia ([place]) 10/04/2016   Wears dentures    full upper and lower    Past Surgical History:  Procedure Laterality Date   ELECTROMAGNETIC NAVIGATION BROCHOSCOPY N/A 12/07/2017   Procedure: ELECTROMAGNETIC NAVIGATION BRONCHOSCOPY;  Surgeon: Flora Lipps, MD;  Location: [facility] ORS;  Service: Cardiopulmonary;  Laterality: N/A;   ESOPHAGOGASTRODUODENOSCOPY (EGD) WITH PROPOFOL N/A 07/08/2016    Procedure: ESOPHAGOGASTRODUODENOSCOPY (EGD) WITH PROPOFOL;  Surgeon: Manya Silvas, MD;  Location: [facility] ENDOSCOPY;  Service: Endoscopy;  Laterality: N/A;   KNEE SURGERY Right    repair of acl tear   PERIPHERAL VASCULAR CATHETERIZATION N/A 07/10/2016   Procedure: Lower Extremity Angiography;  Surgeon: Katha Cabal, MD;  Location: [place] [facility];  Service: Cardiovascular;  Laterality: N/A;   PERIPHERAL VASCULAR CATHETERIZATION N/A 07/10/2016   Procedure: Abdominal Aortogram w/Lower Extremity;  Surgeon: Katha Cabal, MD;  Location: [place] [facility];  Service: Cardiovascular;  Laterality: N/A;   PERIPHERAL VASCULAR CATHETERIZATION  07/10/2016   Procedure: Lower Extremity Intervention;  Surgeon: Katha Cabal, MD;  Location: [place] [facility];  Service: Cardiovascular;;    Allergies: Ciprofloxacin, Codeine, and Nsaids  Medications: Prior to Admission medications   Medication Sig Start Date End Date Taking? Authorizing Provider  albuterol (VENTOLIN HFA) 108 (90 Base) MCG/ACT inhaler SMARTSIG:2 Puff(s) By Mouth Every 4 Hours PRN 12/30/21   [provider]  allopurinol (ZYLOPRIM) 100 MG tablet TAKE 1 TABLET BY MOUTH EVERY DAY 06/02/19   Lequita Asal, MD  clopidogrel (PLAVIX) 75 MG tablet Take 1 tablet (75 mg total) by mouth daily. 07/13/16   Theodoro Grist, MD  cyanocobalamin 500 MCG tablet Take 500 mcg by mouth daily.    [provider]  Fluticasone Furoate (ARNUITY ELLIPTA) 100 MCG/ACT AEPB Inhale 1 puff into the lungs daily. 04/09/22   Tyler Pita, MD  folic acid (FOLVITE) 1 MG tablet TAKE 1 TABLET (1 MG TOTAL) BY MOUTH DAILY. Patient taking differently: Take 1 mg by mouth every Monday, Wednesday, and Friday. 08/29/18   Lequita Asal, MD  hydroxychloroquine (PLAQUENIL) 200  MG tablet Take 200 mg by mouth 2 (two) times daily.    [provider]  levothyroxine (SYNTHROID, LEVOTHROID) 75 MCG tablet Take 75 mcg by mouth daily  before breakfast.  12/30/17   [provider]  lisinopril-hydrochlorothiazide (ZESTORETIC) 20-25 MG tablet Take 1 tablet by mouth daily. 08/19/21   Minna Merritts, MD  pantoprazole (PROTONIX) 40 MG tablet Take 1 tablet (40 mg total) by mouth daily. 04/09/22   Tyler Pita, MD  predniSONE (DELTASONE) 20 MG tablet Take 1 tablet (20 mg total) by mouth daily with breakfast. 05/14/22   Earlie Server, MD  prochlorperazine (COMPAZINE) 10 MG tablet Take 1 tablet (10 mg total) by mouth every 6 (six) hours as needed for nausea or vomiting. 02/25/22   Earlie Server, MD  rosuvastatin (CRESTOR) 40 MG tablet Take 1 tablet (40 mg total) by mouth daily. 08/19/21 05/14/22  Minna Merritts, MD     Family History  Problem Relation Age of Onset   Diabetes Mother    Hypertension Mother    Diabetes Maternal Grandfather    Hypertension Maternal Grandfather    Breast cancer Neg Hx     Social History   Socioeconomic History   Marital status: Widowed    Spouse name: Not on file   Number of children: Not on file   Years of education: Not on file   Highest education level: Not on file  Occupational History   Not on file  Tobacco Use   Smoking status: Former    Packs/day: 1.25    Years: 47.00    Total pack years: 58.75    Types: Cigarettes    Quit date: 07/11/2016    Years since quitting: 5.8   Smokeless tobacco: Never  Vaping Use   Vaping Use: Never used  Substance and Sexual Activity   Alcohol use: No   Drug use: No   Sexual activity: Not Currently    Birth control/protection: None  Other Topics Concern   Not on file  Social History Narrative   Not on file   Social Determinants of Health   Financial Resource Strain: Not on file  Food Insecurity: Not on file  Transportation Needs: Not on file  Physical Activity: Not on file  Stress: Not on file  Social Connections: Not on file    Review of Systems: A 12 point ROS discussed and pertinent positives are indicated in the HPI above.  All  other systems are negative.  Review of Systems  Constitutional:  Negative for fatigue and fever.  HENT:  Negative for congestion.   Respiratory:  Negative for cough and shortness of breath.   Gastrointestinal:  Negative for abdominal pain, diarrhea, nausea and vomiting.    Vital Signs: BP 123/72   Pulse 63   Temp 97.8 F (36.6 C) (Oral)   Resp 16   Ht _0  (1.6 m)   Wt 178 lb (80.7 kg)   SpO2 98%   BMI 31.53 kg/m     Physical Exam Vitals and nursing note reviewed.  Constitutional:      Appearance: She is well-developed. She is obese.  HENT:     Head: Normocephalic and atraumatic.  Eyes:     Conjunctiva/sclera: Conjunctivae normal.  Cardiovascular:     Rate and Rhythm: Normal rate and regular rhythm.  Pulmonary:     Effort: Pulmonary effort is normal.     Breath sounds: Normal breath sounds.  Musculoskeletal:        General: Normal range  of motion.     Cervical back: Normal range of motion.  Skin:    General: Skin is warm.  Neurological:     Mental Status: She is alert and oriented to person, place, and time.  Psychiatric:        Mood and Affect: Mood normal.        Behavior: Behavior normal.        Thought Content: Thought content normal.        Judgment: Judgment normal.     Imaging: No results found.  Labs:  CBC: Recent Labs    03/30/22 1259 05/14/22 1044 05/20/22 1048 05/25/22 0752  WBC 6.6 5.1 5.8 7.5  HGB 9.5* 8.1* 9.6* 9.6*  HCT 30.3* 26.0* 31.4* 31.8*  PLT 127* 144* 183 154    COAGS: No results for input(s): "INR", "APTT" in the last 8760 hours.  BMP: Recent Labs    03/02/22 0845 03/30/22 1259 05/14/22 1044 05/20/22 1048  NA 139 141 138 142  K 3.4* 4.2 3.4* 3.7  CL 103 103 105 105  CO2 _0 32  GLUCOSE 92 101* 92 98  BUN 36* 38* 38* 27*  CALCIUM 8.8* 9.0 9.1 9.3  CREATININE 1.35* 1.29* 2.36* 1.33*  GFRNONAA 44* 46* 22* 45*    LIVER FUNCTION TESTS: Recent Labs    03/02/22 0845 03/30/22 1259 05/14/22 1044  05/20/22 1048  BILITOT 0.6 0.9 1.0 0.7  AST _1 ALT _2 ALKPHOS 38 42 45 44  PROT 8.2* 7.1 7.1 7.0  ALBUMIN 3.8 4.1 3.9 3.7     Assessment and Plan:  66 y.o. female outpatient. History of hypothyroidism, HTN, HLD, Hep C, GERD, CKD, discord lupus. Team is requesting bone marrow biopsy for further evaluation of autoimmune hemolytic anemia.    All labs are within acceptable parameters. Patient is on plavix. Allergies include captor, codeine and NSAIDS. Patient has been NPO since midnight.  Risks and benefits of bone marrow biopsy was discussed with the patient and/or patient's family including, but not limited to bleeding, infection, damage to adjacent structures or low yield requiring additional tests.  All of the questions were answered and there is agreement to proceed.  Consent signed and in chart.   Thank you for this interesting consult.  I greatly enjoyed meeting FLOY ANGERT and look forward to participating in their care.  A copy of this report was sent to the requesting provider on this date.  Electronically Signed: Jacqualine Mau, NP 05/25/2022, 8:28 AM   I spent a total of  30 Minutes   in face to face in clinical consultation, greater than 50% of which was counseling/coordinating care for bone marrow biopsy

## 2022-05-24 LAB — COMP PANEL: LEUKEMIA/LYMPHOMA

## 2022-05-25 ENCOUNTER — Other Ambulatory Visit: Payer: Self-pay

## 2022-05-25 ENCOUNTER — Ambulatory Visit
Admission: RE | Admit: 2022-05-25 | Discharge: 2022-05-25 | Disposition: A | Discharge status: Home / Self Care | Payer: BC Managed Care – PPO | Source: Ambulatory Visit | Attending: Oncology | Admitting: Oncology

## 2022-05-25 DIAGNOSIS — D696 Thrombocytopenia, unspecified: Secondary | ICD-10-CM

## 2022-05-25 DIAGNOSIS — K219 Gastro-esophageal reflux disease without esophagitis: Secondary | ICD-10-CM | POA: Insufficient documentation

## 2022-05-25 DIAGNOSIS — M3214 Glomerular disease in systemic lupus erythematosus: Secondary | ICD-10-CM | POA: Insufficient documentation

## 2022-05-25 DIAGNOSIS — N189 Chronic kidney disease, unspecified: Secondary | ICD-10-CM | POA: Insufficient documentation

## 2022-05-25 DIAGNOSIS — E785 Hyperlipidemia, unspecified: Secondary | ICD-10-CM | POA: Diagnosis not present

## 2022-05-25 DIAGNOSIS — Z7902 Long term (current) use of antithrombotics/antiplatelets: Secondary | ICD-10-CM | POA: Diagnosis not present

## 2022-05-25 DIAGNOSIS — I129 Hypertensive chronic kidney disease with stage 1 through stage 4 chronic kidney disease, or unspecified chronic kidney disease: Secondary | ICD-10-CM | POA: Diagnosis not present

## 2022-05-25 DIAGNOSIS — D539 Nutritional anemia, unspecified: Secondary | ICD-10-CM | POA: Diagnosis not present

## 2022-05-25 DIAGNOSIS — D591 Autoimmune hemolytic anemia, unspecified: Secondary | ICD-10-CM | POA: Diagnosis present

## 2022-05-25 DIAGNOSIS — E039 Hypothyroidism, unspecified: Secondary | ICD-10-CM | POA: Diagnosis not present

## 2022-05-25 DIAGNOSIS — I2510 Atherosclerotic heart disease of native coronary artery without angina pectoris: Secondary | ICD-10-CM | POA: Insufficient documentation

## 2022-05-25 LAB — CBC WITH DIFFERENTIAL/PLATELET
Abs Immature Granulocytes: 0.05 10*3/uL (ref 0.00–0.07)
Basophils Absolute: 0.1 10*3/uL (ref 0.0–0.1)
Basophils Relative: 1 %
Eosinophils Absolute: 0.1 10*3/uL (ref 0.0–0.5)
Eosinophils Relative: 2 %
HCT: 31.8 % — ABNORMAL LOW (ref 36.0–46.0)
Hemoglobin: 9.6 g/dL — ABNORMAL LOW (ref 12.0–15.0)
Immature Granulocytes: 1 %
Lymphocytes Relative: 22 %
Lymphs Abs: 1.6 10*3/uL (ref 0.7–4.0)
MCH: 30.7 pg (ref 26.0–34.0)
MCHC: 30.2 g/dL (ref 30.0–36.0)
MCV: 101.6 fL — ABNORMAL HIGH (ref 80.0–100.0)
Monocytes Absolute: 0.5 10*3/uL (ref 0.1–1.0)
Monocytes Relative: 7 %
Neutro Abs: 5.2 10*3/uL (ref 1.7–7.7)
Neutrophils Relative %: 67 %
Platelets: 154 10*3/uL (ref 150–400)
RBC: 3.13 MIL/uL — ABNORMAL LOW (ref 3.87–5.11)
RDW: 17.2 % — ABNORMAL HIGH (ref 11.5–15.5)
WBC: 7.5 10*3/uL (ref 4.0–10.5)
nRBC: 0 % (ref 0.0–0.2)

## 2022-05-25 MED ORDER — SODIUM CHLORIDE 0.9 % IV SOLN: INTRAVENOUS | Status: DC

## 2022-05-25 MED ORDER — MIDAZOLAM HCL 2 MG/2ML IJ SOLN
INTRAMUSCULAR | Status: AC | PRN
  Administered 2022-05-25: 1 mg via INTRAVENOUS

## 2022-05-25 MED ORDER — FENTANYL CITRATE (PF) 100 MCG/2ML IJ SOLN
INTRAMUSCULAR | Status: AC | PRN
  Administered 2022-05-25: 50 ug via INTRAVENOUS

## 2022-05-25 MED ORDER — FENTANYL CITRATE (PF) 100 MCG/2ML IJ SOLN
INTRAMUSCULAR | Status: AC
  Filled 2022-05-25: qty 2

## 2022-05-25 MED ORDER — HEPARIN SOD (PORK) LOCK FLUSH 100 UNIT/ML IV SOLN
INTRAVENOUS | Status: AC
  Filled 2022-05-25: qty 5

## 2022-05-25 MED ORDER — MIDAZOLAM HCL 2 MG/2ML IJ SOLN
INTRAMUSCULAR | Status: AC
  Filled 2022-05-25: qty 2

## 2022-05-25 NOTE — Progress Notes (Signed)
Pt declined any fluids, prefers to rest with eyes closed

## 2022-05-25 NOTE — Procedures (Signed)
Interventional Radiology Procedure Note  Procedure: CT guided aspirate and core biopsy of right iliac bone  Complications: None  Recommendations: - Bedrest supine x 1 hrs - Hydrocodone PRN  Pain - Follow biopsy results   Ellwood Steidle, MD   

## 2022-05-27 ENCOUNTER — Inpatient Hospital Stay: Payer: BC Managed Care – PPO

## 2022-05-27 DIAGNOSIS — D5911 Warm autoimmune hemolytic anemia: Secondary | ICD-10-CM | POA: Diagnosis not present

## 2022-05-27 DIAGNOSIS — D591 Autoimmune hemolytic anemia, unspecified: Secondary | ICD-10-CM

## 2022-05-27 LAB — CBC WITH DIFFERENTIAL/PLATELET
Abs Immature Granulocytes: 0.03 10*3/uL (ref 0.00–0.07)
Basophils Absolute: 0.1 10*3/uL (ref 0.0–0.1)
Basophils Relative: 1 %
Eosinophils Absolute: 0.1 10*3/uL (ref 0.0–0.5)
Eosinophils Relative: 1 %
HCT: 31.9 % — ABNORMAL LOW (ref 36.0–46.0)
Hemoglobin: 10 g/dL — ABNORMAL LOW (ref 12.0–15.0)
Immature Granulocytes: 0 %
Lymphocytes Relative: 11 %
Lymphs Abs: 0.9 10*3/uL (ref 0.7–4.0)
MCH: 31.7 pg (ref 26.0–34.0)
MCHC: 31.3 g/dL (ref 30.0–36.0)
MCV: 101.3 fL — ABNORMAL HIGH (ref 80.0–100.0)
Monocytes Absolute: 0.4 10*3/uL (ref 0.1–1.0)
Monocytes Relative: 5 %
Neutro Abs: 6.2 10*3/uL (ref 1.7–7.7)
Neutrophils Relative %: 82 %
Platelets: 141 10*3/uL — ABNORMAL LOW (ref 150–400)
RBC: 3.15 MIL/uL — ABNORMAL LOW (ref 3.87–5.11)
RDW: 17.1 % — ABNORMAL HIGH (ref 11.5–15.5)
WBC: 7.6 10*3/uL (ref 4.0–10.5)
nRBC: 0 % (ref 0.0–0.2)

## 2022-05-27 LAB — SAMPLE TO BLOOD BANK

## 2022-05-28 LAB — SURGICAL PATHOLOGY

## 2022-06-03 ENCOUNTER — Inpatient Hospital Stay: Payer: BC Managed Care – PPO

## 2022-06-03 ENCOUNTER — Encounter: Payer: Self-pay | Admitting: Oncology

## 2022-06-03 ENCOUNTER — Inpatient Hospital Stay: Payer: BC Managed Care – PPO | Admitting: Oncology

## 2022-06-03 ENCOUNTER — Inpatient Hospital Stay: Payer: BC Managed Care – PPO | Attending: Oncology

## 2022-06-03 DIAGNOSIS — J449 Chronic obstructive pulmonary disease, unspecified: Secondary | ICD-10-CM | POA: Diagnosis not present

## 2022-06-03 DIAGNOSIS — R197 Diarrhea, unspecified: Secondary | ICD-10-CM | POA: Insufficient documentation

## 2022-06-03 DIAGNOSIS — N1831 Chronic kidney disease, stage 3a: Secondary | ICD-10-CM

## 2022-06-03 DIAGNOSIS — N1832 Chronic kidney disease, stage 3b: Secondary | ICD-10-CM | POA: Diagnosis not present

## 2022-06-03 DIAGNOSIS — Z7952 Long term (current) use of systemic steroids: Secondary | ICD-10-CM | POA: Insufficient documentation

## 2022-06-03 DIAGNOSIS — R918 Other nonspecific abnormal finding of lung field: Secondary | ICD-10-CM | POA: Diagnosis not present

## 2022-06-03 DIAGNOSIS — D696 Thrombocytopenia, unspecified: Secondary | ICD-10-CM | POA: Diagnosis not present

## 2022-06-03 DIAGNOSIS — D631 Anemia in chronic kidney disease: Secondary | ICD-10-CM

## 2022-06-03 DIAGNOSIS — Z87891 Personal history of nicotine dependence: Secondary | ICD-10-CM | POA: Diagnosis not present

## 2022-06-03 DIAGNOSIS — D5911 Warm autoimmune hemolytic anemia: Secondary | ICD-10-CM | POA: Insufficient documentation

## 2022-06-03 DIAGNOSIS — D591 Autoimmune hemolytic anemia, unspecified: Secondary | ICD-10-CM

## 2022-06-03 LAB — CBC WITH DIFFERENTIAL/PLATELET
Abs Immature Granulocytes: 0.07 10*3/uL (ref 0.00–0.07)
Basophils Absolute: 0.1 10*3/uL (ref 0.0–0.1)
Basophils Relative: 0 %
Eosinophils Absolute: 0.1 10*3/uL (ref 0.0–0.5)
Eosinophils Relative: 1 %
HCT: 33.9 % — ABNORMAL LOW (ref 36.0–46.0)
Hemoglobin: 10.6 g/dL — ABNORMAL LOW (ref 12.0–15.0)
Immature Granulocytes: 1 %
Lymphocytes Relative: 6 %
Lymphs Abs: 0.7 10*3/uL (ref 0.7–4.0)
MCH: 31.6 pg (ref 26.0–34.0)
MCHC: 31.3 g/dL (ref 30.0–36.0)
MCV: 101.2 fL — ABNORMAL HIGH (ref 80.0–100.0)
Monocytes Absolute: 0.7 10*3/uL (ref 0.1–1.0)
Monocytes Relative: 6 %
Neutro Abs: 10.2 10*3/uL — ABNORMAL HIGH (ref 1.7–7.7)
Neutrophils Relative %: 86 %
Platelets: 146 10*3/uL — ABNORMAL LOW (ref 150–400)
RBC: 3.35 MIL/uL — ABNORMAL LOW (ref 3.87–5.11)
RDW: 16.2 % — ABNORMAL HIGH (ref 11.5–15.5)
WBC: 11.8 10*3/uL — ABNORMAL HIGH (ref 4.0–10.5)
nRBC: 0 % (ref 0.0–0.2)

## 2022-06-03 LAB — COMPREHENSIVE METABOLIC PANEL
ALT: 15 U/L (ref 0–44)
AST: 15 U/L (ref 15–41)
Albumin: 3.9 g/dL (ref 3.5–5.0)
Alkaline Phosphatase: 39 U/L (ref 38–126)
Anion gap: 6 (ref 5–15)
BUN: 31 mg/dL — ABNORMAL HIGH (ref 8–23)
CO2: 33 mmol/L — ABNORMAL HIGH (ref 22–32)
Calcium: 9.5 mg/dL (ref 8.9–10.3)
Chloride: 103 mmol/L (ref 98–111)
Creatinine, Ser: 1.11 mg/dL — ABNORMAL HIGH (ref 0.44–1.00)
GFR, Estimated: 56 mL/min — ABNORMAL LOW (ref >60)
Glucose, Bld: 79 mg/dL (ref 70–99)
Potassium: 3.7 mmol/L (ref 3.5–5.1)
Sodium: 142 mmol/L (ref 135–145)
Total Bilirubin: 1 mg/dL (ref 0.3–1.2)
Total Protein: 6.5 g/dL (ref 6.5–8.1)

## 2022-06-03 LAB — SAMPLE TO BLOOD BANK

## 2022-06-03 NOTE — Progress Notes (Signed)
Hematology/Oncology Progress note [phone] [phone]      Clinic Day:  06/03/2022  ASSESSMENT & PLAN:   Autoimmune hemolytic anemia (HCC) LDH is normal, adequate B12 and folate level, normal iron panel.  There is increased reticulocyte %-5%.  Cold agglutinin titer negative. Bilirubin is normal. 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  12/22/21 Negative PET scan  Status post IVIG 58m/kg x 2 doses.  Hb is stable currently, hold off Ritxumab for now. Recommend patient to decrease to prednisone 138mdaily.  IVIG 22m422mg x 1 for maintenance.   Bone marrow biopsy did not reveal any lymphoproliferative diseases.  Mild hypocellular marrow, erythroid hyperplasia, mild evidence of spherocytosis, consistent with hemolysis.   Anemia in chronic kidney disease (CKD) Iron panel showed adequate ferritin level.  30,000 units Epogen.  Thrombocytopenia (HCC) Stable. Continue to monitor  Stage 3a chronic kidney disease (CKD) (HCCQuartzsitencourage oral hydration.  Avoid nephrotoxins.    Orders Placed This Encounter  Procedures   CBC with Differential/Platelet    Standing Status:   Future    Standing Expiration Date:   06/04/2023   CBC with Differential/Platelet    Standing Status:   Future    Standing Expiration Date:   06/04/2023   Comprehensive metabolic panel    Standing Status:   Future    Standing Expiration Date:   06/03/2023   Sample to Blood Bank    Standing Status:   Future    Standing Expiration Date:   06/04/2023   Follow up  IVIG x1  Lab cbc 3 weeks, + Epogen  8 weeks, lab MD + Epogen + IVIG   All questions were answered. The patient knows to call the clinic with any problems, questions or concerns.  ZhoEarlie ServerD, PhD [facility] 06/03/2022   Chief Complaint: Tina Page a 66 2o. female  with discoid lupus, recurrent warm autoimmune hemolytic anemia, and renal insufficiency   PERTINENT HEMATOLOGY HISTORY Patient previously followed up by Dr.Corcoran, patient switched care to me on 02/15/21 Extensive medical record review was performed by me   Autoimmune hemolytic anemia diagnosis in 2017 her work-up revealed a cold autoantibody (IgG and complement).  Reticulocyte count was 11.3%.  Ferritin was 562.  Iron studies revealed a saturation of 20% and a TIBC of 214 (low).  B12 was 231 (low normal).  Folate was 42.   Peripheral smear revealed rouleaux formation.    Additional testing included the following + studies:  hepatitis C antibody, hepatitis B core antibody, CMV IgM, and EBV VCA (IgM and IgG).  Hepatitis B by PCR was negative.  Hepatitis C RNA was negative.  Mycoplasma pneumonia IgM was negative.  Reticulocyte count was 11.3% (high) indicating appropriate marrow response.  C3 and C4 were normal.  Cold agglutinin titer was negative x 2.  Negative studies included:  ANA, hepatitis B surface antigen, SPEP, and free light chain ratio.   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  MMA was initially 330 (normal).  Repeat MMA was elevated on 08/01/2016 confirming B12 deficiency.  Hepatitis B surface antibody was positive and hepatitis B surface antigen was negative on 08/06/2016.   07/07/2016  Chest, abdomen, and pelvis CT angiogram on revealed moderate diffuse atherosclerotic vascular disease of the abdominal aorta with severe stenosis of the left common iliac artery with suspected short segment  occlusion. There was no adenopathy.  Spleen was normal.   04/15/2017 Abd US revealed a normal spleen and sludge in the gallbladder.  The liver was echogenic consistent fatty infiltration and/or hepatocellular disease.  For hemolytic anemia treatments, in 2017, She has received multiple units of  warmed PRBCs to date.  08/01/2016-03/12/2017, steroid treatment with  prednisone.  She is also on folic acid 1 mg daily. Borderline low vitamin B12 level on 07/09/2016.  Patient has been on oral vitamin B12 supplementation.  10/12/2017  positive warm autoantibody.  LDH and bilirubin were normal.   She began prednisone 10 mg on 12/07/2017 (discontinued 03/2018).  She received Rituxan weekly x 4 (last dose 06/16/2018).  Entecavir was discontinued on 07/06/2019.  06/12/2020 developed recurrent anemia.  Hematocrit was 27.6, hemoglobin 8.4, MCV 104.5, platelets 138,000, WBC 3,400 (ANC 2,300). Creatinine was 1.17 (CrCl 50 ml/min).  Positive warm autoantibody  Reticulocyte count was 7.1%.  LDH was 204 (98-192).  06/16/2020, began prednisone 1 mg/kilogram treatments, 07/24/2020, prednisone was tapered down to 20 mg.  She received Rituxan 1000 mg on day 1 and 15 (07/15/2020 and 07/29/2020).  She began entecavir on 06/28/2020.  Prednisone was tapered off.  She was on Septra for PCP prophylaxis which is now currently on hold    Other medical problems 07/10/2016.  She underwent PTCA and stent placement in right and left common iliac arteries and left external iliac artery. She is on Plavix.   03/19/2015 EGD revealed gastritis in the body and antrum.  Colonoscopy revealed one hyperplastic polyp.  Repeat EGD on 07/08/2016 was normal.  No evidence of bleeding.  History of she developed peri-oral and intranasal herpes simplex-1.  She was treated with valacyclovir and doxycycline on 10/19/2016.   10/26/2017. She developed flu like symptoms.  Symptoms included cough, myalgias, and fever (tmax unknown).  She was prescribed Mucinex, Tamiflu, and amoxicillin.  She only took amoxicillin x 5 days.  She developed increased liver function tests on 11/02/2017. CMV IgG was positive.  EBV VCA IgG, NA IgG, early antigen antibody IgG were elevated.  EBV VCA IgM was < 36.  Testing was c/w a convalescence/past infection or reactivated infection.  LFTs normalized on 11/15/2017.  Smooth muscle antibody  was 39 (high) on 11/10/2017 and 34 (high) on 11/30/2017.     Chest CT on 11/16/2017 revealed a 2.2 x 2.0 x 2.4 spiculated RIGHT middle lobe mass.  There was tiny nonspecific upper lobe pulmonary nodules greater on RIGHT, largest 3 mm, of uncertain etiology.  There was additional 8 x 8 mm opacity in the posterior sulcus of the RIGHT lower lobe. PET scan on 11/22/2017 revealed a 2 cm hypermetabolic right middle lobe lung mass (SUV 3.4) consistent  with primary lung neoplasm.  There were no findings for mediastinal/hilar lymphadenopathy or metastatic disease.  There were areas of hypermetabolism involving the left oblique abdominal muscles and the anorectal junction. PFTs on 11/18/2017 revealed an FEV1 of 1.22 liters (51%).  DLCO adj was 6.8 mL/mmHg/min (32%).   ENB done on 12/07/2017. Cytology was negative for malignancy. Pathology demonstrated organizing pneumonia and chronic bronchitis. Pathologist commented that organizing pneumonia spanned about 2 mm in one fragment. Cases of focal organizing pneumonia with hypermetabolism on FDG-PET have been described, but changes of this type can also be adjacent to a neoplasm. Patient prescribed a daily dose of Prednisone 10 mg   07/24/2020  repeat  chest CT on  revealed Lung-RADS 2, benign appearance or behavior. There was emphysema, aortic atherosclerosis, and coronary  artery calcifications.  diarrhea.  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  She received azithromycin x 3 days.  She received ciprofloxacin x 10 days without improvement.  She has seen ID in [place].  She began Bactrim on 03/04/2018 (discontinued on 03/11/2018) secondary to increase in creatine.   Chronic kidney disease Urinalysis on 11/15/2017 revealed revealed no hemoglobin, bilirubin or active sediment.   Liver function tests increased on 02/15/2019.  Work-up on 02/16/2019 revealed hepatitis B E antibody positive.  Negative studies included: hepatitis A total  antibody, hepatitis A IgM, hepatitis B surface antigen, hepatitis B E antigen.  Hepatitis B DNA was not detected.  Folate was 80.5 and vitamin B12 was 443.  She has received her vaccinations:  She has completed the PCV13, PPSV23, and HiB.  She received Menvio (quadrivalent meningoccal vaccine) and Bexero (univalent serogroup B meningoccal vaccine) on 07/06/2019.  She was diagnosed with C difficile + diarrhea on 08/12/2020.  She completed a course of oral vancomycin.  Stool was negative for C. difficile on 08/27/2020.  11/17/2020 - 11/20/2020 [place] admission with left L5 varicella zoster.  Lesion was positive for VZV. She was treated with ceftazidime and a valacyclovir.  She was discharged on 9 days of Valtrex 1 gm 3 times daily followed by 500 mg daily indefinitely for suppression and doxycycline.  History of discoid lupus  #Positive hepatitis B core antibody Previously on entecavir 0.5 mg daily, currently she has finished a course. Dose adjusted based on renal function. CrCl 30-49 ml/min   50% dosing (0.5 mg QOD) CrCl >= 50 ml/min 100% dosing (0.5 mg a day). With her kidney function currently QOD She has completed course of Entecavir and stopped 08/14/2021  # PCP prophylaxis Patient was on Septra DS on Mondays, Wednesdays, and Fridays, which was held due to fluctuated kidney functions.  Currently off Septra.  12/03/2021, CT chest lung cancer screening showed new right middle lobe pulmonary nodule with an aggressive appearance characterized as lung RADS 4 VS.  12/22/2021 PET showed No abnormal FDG avidity associated with the resolving right middle lobe pulmonary nodule now with a 12 mm ground-glass opacity in the area of prior solid nodularity, most consistent with an infectious or inflammatory etiology  INTERVAL HISTORY Tina Page is a 66 y.o. female who has above history reviewed by me today presents for follow up visit  Patient remained on prednisone 37m daily. No new complaints.   Chronic fatigue unchanged. S/p IVIG, tolerated well.  Chronic intermittent diarrhea with certain food.   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   Elevated liver function tests 11/03/2017   GERD (gastroesophageal reflux disease)    Heme positive stool 01/29/2015   Hepatitis C 07/12/2016   Ab positive, RNA negative   HLD (hyperlipidemia)    Hypertension    Hypothyroidism    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/23/2017   Post PTCA 07/12/2016   iliac stents bilaterally 2017   [place]rombocytopenia ([place] 10/04/2016   Wears dentures    full upper and lower    Past Surgical History:  Procedure Laterality Date   ELECTROMAGNETIC NAVIGATION BROCHOSCOPY N/A 12/07/2017   Procedure: ELECTROMAGNETIC NAVIGATION BRONCHOSCOPY;  Surgeon: KFlora Lipps MD;  Location: [facility] ORS;  Service: Cardiopulmonary;  Laterality: N/A;   ESOPHAGOGASTRODUODENOSCOPY (  EGD) WITH PROPOFOL N/A 07/08/2016   Procedure: ESOPHAGOGASTRODUODENOSCOPY (EGD) WITH PROPOFOL;  Surgeon: Manya Silvas, MD;  Location: [facility] ENDOSCOPY;  Service: Endoscopy;  Laterality: N/A;   KNEE SURGERY Right    repair of acl tear   PERIPHERAL VASCULAR CATHETERIZATION N/A 07/10/2016   Procedure: Lower Extremity Angiography;  Surgeon: Katha Cabal, MD;  Location: [place] CV LAB;  Service: Cardiovascular;  Laterality: N/A;   PERIPHERAL VASCULAR CATHETERIZATION N/A 07/10/2016   Procedure: Abdominal Aortogram w/Lower Extremity;  Surgeon: Katha Cabal, MD;  Location: [place] CV LAB;  Service: Cardiovascular;  Laterality: N/A;   PERIPHERAL VASCULAR CATHETERIZATION  07/10/2016   Procedure: Lower Extremity Intervention;  Surgeon: Katha Cabal, MD;  Location: [place] CV LAB;  Service: Cardiovascular;;     Family History  Problem Relation Age of Onset   Diabetes Mother    Hypertension Mother    Diabetes Maternal Grandfather    Hypertension Maternal Grandfather    Breast cancer Neg Hx     Social History:  reports that she quit smoking about 5 years ago. Her smoking use included cigarettes. She has a 58.75 pack-year smoking history. She has never used smokeless tobacco. She reports that she does not drink alcohol and does not use drugs.  Former smoker, 58-pack-year smoking history.   She has a daughter, [name] and a daughter named [name].  She lives in [place]. The patient is alone today.  Allergies:  Allergies  Allergen Reactions   Ciprofloxacin Swelling    Facial swelling following single oral dose.    Codeine Anaphylaxis   Nsaids Other (See Comments)    Patient has Hep C.     Current Medications: Current Outpatient Medications  Medication Sig Dispense Refill   albuterol (VENTOLIN HFA) 108 (90 Base) MCG/ACT inhaler SMARTSIG:2 Puff(s) By Mouth Every 4 Hours PRN     allopurinol (ZYLOPRIM) 100 MG tablet TAKE 1 TABLET BY MOUTH EVERY DAY 30 tablet 5   clopidogrel (PLAVIX) 75 MG tablet Take 1 tablet (75 mg total) by mouth daily. 30 tablet 5   cyanocobalamin 500 MCG tablet Take 500 mcg by mouth daily.     entecavir (BARACLUDE) 0.5 MG tablet Take by mouth.     Fluticasone Furoate (ARNUITY ELLIPTA) 100 MCG/ACT AEPB Inhale 1 puff into the lungs daily. 30 each 6   hydroxychloroquine (PLAQUENIL) 200 MG tablet Take 200 mg by mouth 2 (two) times daily.     levothyroxine (SYNTHROID, LEVOTHROID) 75 MCG tablet Take 75 mcg by mouth daily before breakfast.      lisinopril-hydrochlorothiazide (ZESTORETIC) 20-25 MG tablet Take 1 tablet by mouth daily. 90 tablet 4   pantoprazole (PROTONIX) 40 MG tablet Take 1 tablet (40 mg total) by mouth daily. 30 tablet 3   predniSONE (DELTASONE) 20 MG tablet Take 1 tablet (20 mg total) by mouth daily with breakfast. 30 tablet 0   folic acid (FOLVITE) 1 MG  tablet TAKE 1 TABLET (1 MG TOTAL) BY MOUTH DAILY. (Patient not taking: Reported on 06/03/2022) 90 tablet 1   rosuvastatin (CRESTOR) 40 MG tablet Take 1 tablet (40 mg total) by mouth daily. 90 tablet 3   No current facility-administered medications for this visit.    Review of Systems  Constitutional:  Negative for appetite change, chills, fatigue and fever.  HENT:   Negative for hearing loss and voice change.   Eyes:  Negative for eye problems.  Respiratory:  Negative for chest tightness and cough.   Cardiovascular:  Negative for chest pain.  Gastrointestinal:  Negative for abdominal distention, abdominal pain and blood in stool.  Endocrine: Negative for hot flashes.  Genitourinary:  Negative for difficulty urinating and frequency.   Musculoskeletal:  Negative for arthralgias.  Skin:  Negative for itching and rash.  Neurological:  Negative for extremity weakness.  Hematological:  Negative for adenopathy.  Psychiatric/Behavioral:  Negative for confusion. The patient is not nervous/anxious.      Performance status (ECOG): 1  Vital Signs Blood pressure (!) 145/59, pulse 66, temperature 98.1 F (36.7 C), temperature source Tympanic, resp. rate 18, height 5' 3"  (1.6 m), weight 178 lb (80.7 kg), SpO2 100 %.    Laboratory findings     Latest Ref Rng & Units 06/03/2022    9:59 AM 05/27/2022   10:57 AM 05/25/2022    7:52 AM  CBC  WBC 4.0 - 10.5 K/uL 11.8  7.6  7.5   Hemoglobin 12.0 - 15.0 g/dL 10.6  10.0  9.6   Hematocrit 36.0 - 46.0 % 33.9  31.9  31.8   Platelets 150 - 400 K/uL 146  141  154       Latest Ref Rng & Units 06/03/2022    9:59 AM 05/20/2022   10:48 AM 05/14/2022   10:44 AM  CMP  Glucose 70 - 99 mg/dL 79  98  92   BUN 8 - 23 mg/dL 31  27  38   Creatinine 0.44 - 1.00 mg/dL 1.11  1.33  2.36   Sodium 135 - 145 mmol/L 142  142  138   Potassium 3.5 - 5.1 mmol/L 3.7  3.7  3.4   Chloride 98 - 111 mmol/L 103  105  105   CO2 22 - 32 mmol/L 33  32  27   Calcium 8.9 - 10.3  mg/dL 9.5  9.3  9.1   Total Protein 6.5 - 8.1 g/dL 6.5  7.0  7.1   Total Bilirubin 0.3 - 1.2 mg/dL 1.0  0.7  1.0   Alkaline Phos 38 - 126 U/L 39  44  45   AST 15 - 41 U/L 15  16  23    ALT 0 - 44 U/L 15  11  15

## 2022-06-03 NOTE — Assessment & Plan Note (Signed)
Stable.       - Continue to monitor

## 2022-06-03 NOTE — Progress Notes (Signed)
Patient has a head cold she states from being on oxygen during bone marrow biopsy and she is having trouble sleeping due to prednisone. Occasional diarrhea she states "depending on what I eat"

## 2022-06-03 NOTE — Assessment & Plan Note (Addendum)
LDH is normal, adequate B12 and folate level, normal iron panel.  There is increased reticulocyte %-5%.  Cold agglutinin titer negative. Bilirubin is normal. 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  12/22/21 Negative PET scan  Status post IVIG 52m/kg x 2 doses.  Hb is stable currently, hold off Ritxumab for now. Recommend patient to decrease to prednisone 126mdaily.  IVIG 93m57mg x 1 for maintenance.   Bone marrow biopsy did not reveal any lymphoproliferative diseases.  Mild hypocellular marrow, erythroid hyperplasia, mild evidence of spherocytosis, consistent with hemolysis.

## 2022-06-03 NOTE — Assessment & Plan Note (Signed)
Iron panel showed adequate ferritin level.  30,000 units Epogen.

## 2022-06-03 NOTE — Assessment & Plan Note (Signed)
Encourage oral hydration. Avoid nephrotoxins. 

## 2022-06-05 ENCOUNTER — Telehealth: Payer: Self-pay

## 2022-06-05 NOTE — Telephone Encounter (Signed)
Disability and Leave Approval and extension request Fom completed for pt and faxed to [facility].   FMLA forms for pt's daughter Haskel Schroeder completed and faxed to [employer]   Pt will pick up original forms and copies will be scanned to chart.

## 2022-06-08 ENCOUNTER — Inpatient Hospital Stay: Payer: BC Managed Care – PPO

## 2022-06-08 VITALS — BP 131/65 | HR 69 | Temp 94.8°F | Resp 18

## 2022-06-08 DIAGNOSIS — D591 Autoimmune hemolytic anemia, unspecified: Secondary | ICD-10-CM

## 2022-06-08 DIAGNOSIS — D5911 Warm autoimmune hemolytic anemia: Secondary | ICD-10-CM | POA: Diagnosis not present

## 2022-06-08 MED ORDER — IMMUNE GLOBULIN (HUMAN) 5 GM/50ML IV SOLN
65.0000 g | Freq: Once | INTRAVENOUS | Status: AC
  Administered 2022-06-08: 65 g via INTRAVENOUS
  Filled 2022-06-08: qty 400

## 2022-06-08 MED ORDER — EPOETIN ALFA 40000 UNIT/ML IJ SOLN
30000.0000 [IU] | Freq: Once | INTRAMUSCULAR | Status: DC
  Filled 2022-06-08: qty 1

## 2022-06-08 MED ORDER — DIPHENHYDRAMINE HCL 25 MG PO CAPS
25.0000 mg | ORAL_CAPSULE | Freq: Four times a day (QID) | ORAL | Status: DC | PRN
  Administered 2022-06-08: 25 mg via ORAL
  Filled 2022-06-08: qty 1

## 2022-06-08 MED ORDER — DEXTROSE 5 % IV SOLN
INTRAVENOUS | Status: DC
  Filled 2022-06-08: qty 250

## 2022-06-08 NOTE — Patient Instructions (Addendum)
[facility]CER CTR AT [place]  Discharge Instructions: Thank you for choosing [facility] to provide your oncology and hematology care.  If you have a lab appointment with the [facility], please go directly to the [place] and check in at the registration area.  Wear comfortable clothing and clothing appropriate for easy access to any Portacath or PICC line.   We strive to give you quality time with your provider. You may need to reschedule your appointment if you arrive late (15 or more minutes).  Arriving late affects you and other patients whose appointments are after yours.  Also, if you miss three or more appointments without notifying the office, you may be dismissed from the clinic at the provider's discretion.      For prescription refill requests, have your pharmacy contact our office and allow 72 hours for refills to be completed.    Today you received the following chemotherapy and/or immunotherapy agents Immune Globulin (IVIG).      To help prevent nausea and vomiting after your treatment, we encourage you to take your nausea medication as directed.  BELOW ARE SYMPTOMS THAT SHOULD BE REPORTED IMMEDIATELY: *FEVER GREATER THAN 100.4 F (38 C) OR HIGHER *CHILLS OR SWEATING *NAUSEA AND VOMITING THAT IS NOT CONTROLLED WITH YOUR NAUSEA MEDICATION *UNUSUAL SHORTNESS OF BREATH *UNUSUAL BRUISING OR BLEEDING *URINARY PROBLEMS (pain or burning when urinating, or frequent urination) *BOWEL PROBLEMS (unusual diarrhea, constipation, pain near the anus) TENDERNESS IN MOUTH AND THROAT WITH OR WITHOUT PRESENCE OF ULCERS (sore throat, sores in mouth, or a toothache) UNUSUAL RASH, SWELLING OR PAIN  UNUSUAL VAGINAL DISCHARGE OR ITCHING   Items with * indicate a potential emergency and should be followed up as soon as possible or go to the Emergency Department if any problems should occur.  Please show the CHEMOTHERAPY ALERT CARD or IMMUNOTHERAPY ALERT CARD at  check-in to the Emergency Department and triage nurse.  Should you have questions after your visit or need to cancel or reschedule your appointment, please contact [facility] [place] AT [place]  [phone] and follow the prompts.  Office hours are 8:00 a.m. to 4:30 p.m. Monday - Friday. Please note that voicemails left after 4:00 p.m. may not be returned until the following business day.  We are closed weekends and major holidays. You have access to a nurse at all times for urgent questions. Please call the main number to the clinic [phone] and follow the prompts.  For any non-urgent questions, you may also contact your provider using MyChart. We now offer e-Visits for anyone 64 and older to request care online for non-urgent symptoms. For details visit [email or web address].   Also download the MyChart app! Go to the app store, search "MyChart", open the app, select [facility], and log in with your MyChart username and password.  Masks are optional in the cancer centers. If you would like for your care team to wear a mask while they are taking care of you, please let them know. For doctor visits, patients may have with them one support person who is at least 64 years old. At this time, visitors are not allowed in the infusion area.

## 2022-06-16 ENCOUNTER — Other Ambulatory Visit: Payer: Self-pay | Admitting: Acute Care

## 2022-06-16 DIAGNOSIS — R911 Solitary pulmonary nodule: Secondary | ICD-10-CM

## 2022-06-24 ENCOUNTER — Inpatient Hospital Stay: Payer: BC Managed Care – PPO

## 2022-06-24 VITALS — BP 138/72 | HR 88

## 2022-06-24 DIAGNOSIS — D5911 Warm autoimmune hemolytic anemia: Secondary | ICD-10-CM | POA: Diagnosis not present

## 2022-06-24 DIAGNOSIS — D591 Autoimmune hemolytic anemia, unspecified: Secondary | ICD-10-CM

## 2022-06-24 LAB — CBC WITH DIFFERENTIAL/PLATELET
Abs Immature Granulocytes: 0.05 10*3/uL (ref 0.00–0.07)
Basophils Absolute: 0.1 10*3/uL (ref 0.0–0.1)
Basophils Relative: 1 %
Eosinophils Absolute: 0.1 10*3/uL (ref 0.0–0.5)
Eosinophils Relative: 1 %
HCT: 31.9 % — ABNORMAL LOW (ref 36.0–46.0)
Hemoglobin: 9.9 g/dL — ABNORMAL LOW (ref 12.0–15.0)
Immature Granulocytes: 1 %
Lymphocytes Relative: 16 %
Lymphs Abs: 1.2 10*3/uL (ref 0.7–4.0)
MCH: 31.5 pg (ref 26.0–34.0)
MCHC: 31 g/dL (ref 30.0–36.0)
MCV: 101.6 fL — ABNORMAL HIGH (ref 80.0–100.0)
Monocytes Absolute: 0.5 10*3/uL (ref 0.1–1.0)
Monocytes Relative: 7 %
Neutro Abs: 5.3 10*3/uL (ref 1.7–7.7)
Neutrophils Relative %: 74 %
Platelets: 123 10*3/uL — ABNORMAL LOW (ref 150–400)
RBC: 3.14 MIL/uL — ABNORMAL LOW (ref 3.87–5.11)
RDW: 15.2 % (ref 11.5–15.5)
WBC: 7.1 10*3/uL (ref 4.0–10.5)
nRBC: 0.3 % — ABNORMAL HIGH (ref 0.0–0.2)

## 2022-06-24 MED ORDER — EPOETIN ALFA 10000 UNIT/ML IJ SOLN
30000.0000 [IU] | Freq: Once | INTRAMUSCULAR | Status: AC
  Administered 2022-06-24: 30000 [IU] via SUBCUTANEOUS
  Filled 2022-06-24: qty 3

## 2022-06-25 ENCOUNTER — Other Ambulatory Visit: Payer: Self-pay | Admitting: Oncology

## 2022-06-29 ENCOUNTER — Encounter (INDEPENDENT_AMBULATORY_CARE_PROVIDER_SITE_OTHER): Payer: Self-pay

## 2022-07-02 ENCOUNTER — Other Ambulatory Visit: Payer: Self-pay | Admitting: *Deleted

## 2022-07-03 MED ORDER — PREDNISONE 10 MG PO TABS: 10.0000 mg | ORAL_TABLET | Freq: Every day | ORAL | 0 refills | Status: DC

## 2022-07-03 NOTE — Telephone Encounter (Signed)
Left message on patient voice mail asking her to return my call so I can get more information on her refill request

## 2022-07-03 NOTE — Telephone Encounter (Signed)
Patient called back stating that she is down to 4 tabs, She had 5 mg tabs and has been taking 2 at a time for the 10 mg dose and now needs refill. She is asking if she is to continue with 10 mg or does she need to reduce to 5 mg?

## 2022-07-07 ENCOUNTER — Ambulatory Visit: Payer: BC Managed Care – PPO | Attending: Pulmonary Disease

## 2022-07-07 DIAGNOSIS — J45998 Other asthma: Secondary | ICD-10-CM | POA: Insufficient documentation

## 2022-07-07 DIAGNOSIS — F17210 Nicotine dependence, cigarettes, uncomplicated: Secondary | ICD-10-CM | POA: Diagnosis not present

## 2022-07-07 LAB — PULMONARY FUNCTION TEST ARMC ONLY
DL/VA % pred: 56 %
DL/VA: 2.37 ml/min/mmHg/L
DLCO unc % pred: 48 %
DLCO unc: 9.31 ml/min/mmHg
FEF 25-75 Post: 0.8 L/sec
FEF 25-75 Pre: 0.46 L/sec
FEF2575-%Change-Post: 74 %
FEF2575-%Pred-Post: 37 %
FEF2575-%Pred-Pre: 21 %
FEV1-%Change-Post: 18 %
FEV1-%Pred-Post: 43 %
FEV1-%Pred-Pre: 36 %
FEV1-Post: 1.02 L
FEV1-Pre: 0.86 L
FEV1FVC-%Change-Post: 0 %
FEV1FVC-%Pred-Pre: 83 %
FEV6-%Change-Post: 18 %
FEV6-%Pred-Post: 53 %
FEV6-%Pred-Pre: 45 %
FEV6-Post: 1.58 L
FEV6-Pre: 1.34 L
FEV6FVC-%Change-Post: 0 %
FEV6FVC-%Pred-Post: 103 %
FEV6FVC-%Pred-Pre: 104 %
FVC-%Change-Post: 18 %
FVC-%Pred-Post: 51 %
FVC-%Pred-Pre: 43 %
FVC-Post: 1.59 L
FVC-Pre: 1.34 L
Post FEV1/FVC ratio: 64 %
Post FEV6/FVC ratio: 100 %
Pre FEV1/FVC ratio: 64 %
Pre FEV6/FVC Ratio: 100 %
RV % pred: 136 %
RV: 2.75 L
TLC % pred: 91 %
TLC: 4.5 L

## 2022-07-10 ENCOUNTER — Ambulatory Visit: Payer: BC Managed Care – PPO | Admitting: Pulmonary Disease

## 2022-07-10 ENCOUNTER — Encounter: Payer: Self-pay | Admitting: Pulmonary Disease

## 2022-07-10 VITALS — BP 136/80 | HR 80 | Temp 97.9°F | Ht 63.0 in | Wt 184.0 lb

## 2022-07-10 DIAGNOSIS — R918 Other nonspecific abnormal finding of lung field: Secondary | ICD-10-CM | POA: Diagnosis not present

## 2022-07-10 DIAGNOSIS — R0602 Shortness of breath: Secondary | ICD-10-CM | POA: Diagnosis not present

## 2022-07-10 DIAGNOSIS — J4540 Moderate persistent asthma, uncomplicated: Secondary | ICD-10-CM

## 2022-07-10 DIAGNOSIS — J4489 Other specified chronic obstructive pulmonary disease: Secondary | ICD-10-CM | POA: Diagnosis not present

## 2022-07-10 DIAGNOSIS — Z87891 Personal history of nicotine dependence: Secondary | ICD-10-CM

## 2022-07-10 MED ORDER — TRELEGY ELLIPTA 100-62.5-25 MCG/ACT IN AEPB: 1.0000 | INHALATION_SPRAY | Freq: Every day | RESPIRATORY_TRACT | 11 refills | Status: DC

## 2022-07-10 MED ORDER — TRELEGY ELLIPTA 100-62.5-25 MCG/ACT IN AEPB: 1.0000 | INHALATION_SPRAY | Freq: Every day | RESPIRATORY_TRACT | 0 refills | Status: DC

## 2022-07-10 NOTE — Progress Notes (Signed)
Subjective:    Patient ID: Tina Page, female    DOB: Jan 05, 1956, 66 y.o.   MRN: [id number] Patient Care Team: Frazier Richards, MD as PCP - General (Family Medicine) Leonel Ramsay, MD (Infectious Diseases) Telford Nab, RN as Registered Nurse Lucilla Lame, MD as Consulting Physician (Gastroenterology) Tsosie Billing, MD as Consulting Physician (Infectious Diseases) Minna Merritts, MD as Consulting Physician (Cardiology)  Chief Complaint  Patient presents with   Follow-up    SOB with exertion. Constant wheezing. No cough.    HPI Tina Page is a 66 year old female, former smoker quit in 2017 (63-pack-year history).  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  Deviously followed by Dr. Mortimer Fries (last seen 2019), last seen by me on 09 April 2022.  No exacerbations since her prior visit.  Has been compliant with Arnuity Ellipta.  Patient presents today for follow-up.  She had pulmonary function testing performed 07 July 2022 which has confirmed COPD/asthma overlap.  She has had significant decrease in function from prior PFTs performed in 2019.  She has been compliant with Arnuity elliptica however did discontinue it briefly thinking she could "get off of it" however her cough returned.  Once that she resume the Arnuity her cough improved.  She uses albuterol at least once a day due to shortness of breath and wheezing this is effective.  She had significant bronchodilator response noted on her PFTs.  She has not had any fevers, chills or sweats since her last visit.  No cough once she restarted the Arnuity.  No sputum production no hemoptysis.  She does not endorse any other symptomatology today.  Her dyspnea is pretty much at baseline.  She has had evanescent  pulmonary nodules that have been followed closely by the lung cancer screening program.  Also prior electromagnetic navigation bronchoscopy in April 2019 by Dr. Mortimer Fries with biopsy of the right middle lobe nodule that showed organizing pneumonia and chronic bronchitis.   DATA PFTS: 11/18/2017 PFTs: FEV1 1.22 L or 51% predicted, FVC 1.88 L or 63% predicted, FEV1/FVC 65%, no bronchodilator response.  Lung volumes normal, diffusion capacity moderate to severely impaired. 07/07/2022 PFTs: FEV1 0.86 L or 36% predicted, FVC 1.34 L or 43% predicted, FEV1/FVC 64%, there is significant bronchodilator response with both response in FEV1 and FVC improvement.  No hyperinflation, air trapping evident.  Moderate to severe diffusion capacity impairment.  There is modest decline in lung function when compared to prior.  Consistent with asthma/COPD overlap. IMAGING: 02/16/18 CT chest wo contrast- showed almost complete resolution of spiculated nodules RML. Stable 7m right parahilar nodules since March 2019 but not present in 2017.  06/27/19 CT chest wo contrast-  previously described nodule of the right middle lobe has almost completely resolved, with residual linear scarring. There is persistent paramedian consolidation of the medial right middle lobe and lingula generally in keeping with atypical infection, particularly atypical mycobacterium. Interval increase in size of a small nodule of the right middle lobe, now measuring 4 mm, previously 2 mm Unchanged tiny biapical pulmonary nodules, benign sequelae of nonspecific infection or inflammation, possibly related cigarette smoking, atypical infection, or other inhalational, inflammatory lung disease. 12/27/19 CT Chest wo contrast- stable sub-centimeter bilateral pulmonary nodules consistent with benign etiology. Stable paramedian consolidation RML and lingula. New airspace disease inferior RML with a few adjacent subcenterimeter nodules. Overall, consistent with  waxing and  waning atypical infectious process such as MAI 07/24/2020 chest LDCT: Lung RADS 2, benign appearance or behavior.  Moderate centrilobular emphysema, multiple nonsolid and solid tiny nodules largest of which was 4.2 mm 12/02/2021 LDCT chest: New right middle lobe pulmonary nodule with aggressive appearance categorized as lung RADS 4 BS, suspicious.  Mild diffuse bronchial wall thickening and mild centrilobular and paraseptal emphysema imaging findings suggestive of underlying COPD 12/22/2021 PET/CT: The previously noted right middle pulmonary nodule appears to be resolving, no FDG avidity noted.  Consistent with infectious/inflammatory etiology.  Recommend continued radiographic follow-up to resolution.  Hypermetabolic activity in the intercostal muscles and crura of the diaphragm reflecting physiologic activity related to ventilation 03/05/2022 LDCT chest: Lung RADS 3, probably benign findings, new 5.9 mm right lower lobe nodule, short-term follow-up 6 months recommended.  Continued decrease in the size of the right middle lobe nodule.   Review of Systems A 10 point review of systems was performed and it is as noted above otherwise negative.  Patient Active Problem List   Diagnosis Date Noted   Anemia in chronic kidney disease (CKD) 03/30/2022   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   Preseptal cellulitis of right eye 08/29/2021   Leukopenia 11/22/2020   Herpes zoster without complication    Left leg pain 11/17/2020   Diarrhea 09/01/2020   Clostridium difficile diarrhea 08/26/2020   Lupus ([place]) 08/11/2020   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[place]) 08/11/2020   Chronic viral hepatitis B without delta agent and without coma ([place]) 08/11/2020   Papular rash 08/05/2020   Bacteremia due to Escherichia coli 07/17/2020   UTI (urinary tract infection) 07/16/2020   Hypothyroidism    Fever    Encounter for antineoplastic immunotherapy 07/15/2020   Goals of care, counseling/discussion  06/19/2020   Abnormal CT of the chest 06/04/2020   Oral candidiasis 06/04/2020   Former smoker 06/04/2020   Pulmonary nodules 12/28/2019   Stage 3a chronic kidney disease (CKD) ([place]) 05/15/2019   Postop check 04/24/2019   Paronychia of great toe of right foot 04/17/2019   Ingrowing nail 04/17/2019   Medication monitoring encounter 03/25/2018   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 coli 02/18/2018   Aortic atherosclerosis ([place]) 12/13/2017   Shortness of breath 12/13/2017   Nodule of middle lobe of right lung 11/23/2017   Renal insufficiency 11/17/2017   Autoimmune hemolytic anemia ([place]) 11/03/2017   Elevated liver function tests 11/03/2017   Elevated uric acid in blood 11/03/2017   Low serum cortisol level 01/04/2017   Elevated alkaline phosphatase level 10/25/2016   Oral herpes simplex infection 10/25/2016   Current chronic use of systemic steroids 10/23/2016   Impetigo 10/23/2016   Thrombocytopenia ([place]) 10/04/2016   B12 deficiency 08/06/2016   Symptomatic anemia 07/21/2016   Atherosclerosis of native arteries of extremity with intermittent claudication ([place]) 07/20/2016   PAD (peripheral artery disease) ([place]) 07/12/2016   Post PTCA 07/12/2016   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[place]) 07/12/2016   Autoimmune hemolytic anemia, cold antibody type ([place]) 07/12/2016   Hepatitis B core antibody positive 07/12/2016   Tobacco abuse 07/12/2016   Leg pain 07/07/2016   Essential hypertension 07/07/2016   HLD (hyperlipidemia) 07/07/2016   COPD (chronic obstructive pulmonary disease) ([place]) 07/07/2016   Acute kidney injury superimposed on CKD ([place]) 07/07/2016   Heme positive stool 01/29/2015   Social History   Tobacco Use   Smoking status: Former    Packs/day: 1.25  Years: 47.00    Total pack years: 58.75    Types: Cigarettes    Quit date: 07/11/2016    Years since quitting: 6.0    Smokeless tobacco: Never  Substance Use Topics   Alcohol use: No   Allergies  Allergen Reactions   Ciprofloxacin Swelling    Facial swelling following single oral dose.    Codeine Anaphylaxis   Nsaids Other (See Comments)    Patient has Hep C.    Current Meds  Medication Sig   albuterol (VENTOLIN HFA) 108 (90 Base) MCG/ACT inhaler SMARTSIG:2 Puff(s) By Mouth Every 4 Hours PRN   allopurinol (ZYLOPRIM) 100 MG tablet TAKE 1 TABLET BY MOUTH EVERY DAY   clopidogrel (PLAVIX) 75 MG tablet Take 1 tablet (75 mg total) by mouth daily.   cyanocobalamin 500 MCG tablet Take 500 mcg by mouth daily.   entecavir (BARACLUDE) 0.5 MG tablet Take by mouth.   Fluticasone Furoate (ARNUITY ELLIPTA) 100 MCG/ACT AEPB Inhale 1 puff into the lungs daily.   hydroxychloroquine (PLAQUENIL) 200 MG tablet Take 200 mg by mouth 2 (two) times daily.   levothyroxine (SYNTHROID, LEVOTHROID) 75 MCG tablet Take 75 mcg by mouth daily before breakfast.    lisinopril-hydrochlorothiazide (ZESTORETIC) 20-25 MG tablet Take 1 tablet by mouth daily.   pantoprazole (PROTONIX) 40 MG tablet Take 1 tablet (40 mg total) by mouth daily.   predniSONE (DELTASONE) 10 MG tablet Take 1 tablet (10 mg total) by mouth daily with breakfast.   Immunization History  Administered Date(s) Administered   HIB (PRP-T) 04/14/2018   Influenza Split 05/29/2011, 05/25/2013   Influenza,inj,Quad PF,6+ Mos 05/17/2018, 07/02/2020, 05/20/2022   Influenza,inj,quad, With Preservative 05/17/2014   Influenza-Unspecified 05/17/2018, 05/16/2019   Meningococcal B, OMV 07/06/2019   Meningococcal Mcv4o 07/06/2019, 07/02/2020   Moderna Sars-Covid-2 Vaccination 11/14/2019, 12/08/2019   Pneumococcal Conjugate-13 04/14/2018   Pneumococcal Polysaccharide-23 07/22/2016   Tdap 05/29/2011, 10/18/2014, 11/18/2020       Objective:   Physical Exam BP 136/80 (BP Location: Left Arm, Cuff Size: Normal)   Pulse 80   Temp 97.9 F (36.6 C)   Ht '5\' 3"'$  (1.6 m)   Wt 184  lb (83.5 kg)   SpO2 99%   BMI 32.59 kg/m  GENERAL: Overweight woman, no acute distress, fully ambulatory.  No conversational dyspnea. HEAD: Normocephalic, atraumatic.  EYES: Pupils equal, round, reactive to light.  No scleral icterus.  MOUTH: Poor dentition, oral mucosa moist.  No thrush. NECK: Supple. No thyromegaly. Trachea midline. No JVD.  No adenopathy. PULMONARY: Good air entry bilaterally.  Faint end expiratory wheezes noted. CARDIOVASCULAR: S1 and S2. Regular rate and rhythm.  No rubs, murmurs or gallops heard. ABDOMEN: Obese, otherwise benign. MUSCULOSKELETAL: No joint deformity, no clubbing, no edema.  NEUROLOGIC: No overt focal deficit, no gait disturbance, speech is fluent. SKIN: Intact,warm,dry.  Multiple petechiae noted.   PSYCH: Mood and behavior normal.      Assessment & Plan:     ICD-10-CM   1. Asthma-COPD overlap syndrome  J44.89    Will need to switch Arnuity to Trelegy Ellipta 100 Continue as needed albuterol Follow-up 2 to 3 months or as needed    2. Moderate persistent asthma without complication  W09.81    Has significant airway reversibility Trelegy this should also help with this    3. Pulmonary nodules  R91.8    Previously noted to be inflammatory Need to monitor closely    4. Shortness of breath  R06.02    Should hopefully be improved by  Trelegy Continue to monitor    5. Former cigarette smoker  Z87.891    No evidence of relapse     Meds ordered this encounter  Medications   Fluticasone-Umeclidin-Vilant (TRELEGY ELLIPTA) 100-62.5-25 MCG/ACT AEPB    Sig: Inhale 1 puff into the lungs daily.    Dispense:  14 each    Refill:  0    Order Specific Question:   Lot Number?    Answer:   [id number]    Order Specific Question:   Expiration Date?    Answer:   11/30/2023    Order Specific Question:   Quantity    Answer:   1   Fluticasone-Umeclidin-Vilant (TRELEGY ELLIPTA) 100-62.5-25 MCG/ACT AEPB    Sig: Inhale 1 puff into the lungs daily.    Dispense:   28 each    Refill:  11   We will see the patient in follow-up in 2 to 3 months time she is to contact us prior to that time should any new difficulties arise.  Renold Don, MD Advanced Bronchoscopy [facility]    *This note was dictated using voice recognition software/Dragon.  Despite best efforts to proofread, errors can occur which can change the meaning. Any transcriptional errors that result from this process are unintentional and may not be fully corrected at the time of dictation.

## 2022-07-10 NOTE — Patient Instructions (Addendum)
Your breathing test showed that you have a significant element of asthma.  You also have some underlying COPD mostly on the basis of chronic bronchitis and some mild emphysema.  We discussed the possibility that you may have "shrinking lung syndrome" this is something that we see in lupus patients however, at this point your breathing tests and the scans you have had of your chest do not support that.   We are switching your inhaler from [place] to [place].  It is basically the same mechanism.  It is 1 puff daily.  Make sure you rinse your mouth well after you use it.  May still use your rescue inhaler (albuterol) as needed during the day up to 4 times a day.   We will see you in follow-up in 2 to 3 months time call sooner should any new problems arise.

## 2022-07-17 ENCOUNTER — Encounter: Payer: Self-pay | Admitting: Pulmonary Disease

## 2022-07-29 ENCOUNTER — Inpatient Hospital Stay: Payer: BC Managed Care – PPO

## 2022-07-29 ENCOUNTER — Other Ambulatory Visit: Payer: Self-pay | Admitting: Pulmonary Disease

## 2022-07-29 ENCOUNTER — Inpatient Hospital Stay (HOSPITAL_BASED_OUTPATIENT_CLINIC_OR_DEPARTMENT_OTHER): Payer: BC Managed Care – PPO | Admitting: Oncology

## 2022-07-29 ENCOUNTER — Inpatient Hospital Stay: Payer: BC Managed Care – PPO | Attending: Oncology

## 2022-07-29 ENCOUNTER — Telehealth: Payer: Self-pay | Admitting: *Deleted

## 2022-07-29 ENCOUNTER — Encounter: Payer: Self-pay | Admitting: Oncology

## 2022-07-29 VITALS — BP 137/63 | HR 87 | Temp 96.8°F | Resp 18 | Wt 186.7 lb

## 2022-07-29 VITALS — BP 135/66 | HR 86 | Temp 97.0°F | Resp 18

## 2022-07-29 DIAGNOSIS — R768 Other specified abnormal immunological findings in serum: Secondary | ICD-10-CM

## 2022-07-29 DIAGNOSIS — D631 Anemia in chronic kidney disease: Secondary | ICD-10-CM | POA: Insufficient documentation

## 2022-07-29 DIAGNOSIS — N1832 Chronic kidney disease, stage 3b: Secondary | ICD-10-CM | POA: Diagnosis not present

## 2022-07-29 DIAGNOSIS — Z7952 Long term (current) use of systemic steroids: Secondary | ICD-10-CM | POA: Insufficient documentation

## 2022-07-29 DIAGNOSIS — Z87891 Personal history of nicotine dependence: Secondary | ICD-10-CM | POA: Insufficient documentation

## 2022-07-29 DIAGNOSIS — N1831 Chronic kidney disease, stage 3a: Secondary | ICD-10-CM

## 2022-07-29 DIAGNOSIS — Z8249 Family history of ischemic heart disease and other diseases of the circulatory system: Secondary | ICD-10-CM | POA: Insufficient documentation

## 2022-07-29 DIAGNOSIS — Z833 Family history of diabetes mellitus: Secondary | ICD-10-CM | POA: Insufficient documentation

## 2022-07-29 DIAGNOSIS — D591 Autoimmune hemolytic anemia, unspecified: Secondary | ICD-10-CM

## 2022-07-29 DIAGNOSIS — D5911 Warm autoimmune hemolytic anemia: Secondary | ICD-10-CM | POA: Diagnosis not present

## 2022-07-29 LAB — COMPREHENSIVE METABOLIC PANEL
ALT: 13 U/L (ref 0–44)
AST: 19 U/L (ref 15–41)
Albumin: 4.1 g/dL (ref 3.5–5.0)
Alkaline Phosphatase: 50 U/L (ref 38–126)
Anion gap: 8 (ref 5–15)
BUN: 27 mg/dL — ABNORMAL HIGH (ref 8–23)
CO2: 30 mmol/L (ref 22–32)
Calcium: 9.5 mg/dL (ref 8.9–10.3)
Chloride: 105 mmol/L (ref 98–111)
Creatinine, Ser: 1.34 mg/dL — ABNORMAL HIGH (ref 0.44–1.00)
GFR, Estimated: 44 mL/min — ABNORMAL LOW (ref >60)
Glucose, Bld: 88 mg/dL (ref 70–99)
Potassium: 3.7 mmol/L (ref 3.5–5.1)
Sodium: 143 mmol/L (ref 135–145)
Total Bilirubin: 0.9 mg/dL (ref 0.3–1.2)
Total Protein: 7.3 g/dL (ref 6.5–8.1)

## 2022-07-29 LAB — CBC WITH DIFFERENTIAL/PLATELET
Abs Immature Granulocytes: 0.03 10*3/uL (ref 0.00–0.07)
Basophils Absolute: 0.1 10*3/uL (ref 0.0–0.1)
Basophils Relative: 1 %
Eosinophils Absolute: 0.1 10*3/uL (ref 0.0–0.5)
Eosinophils Relative: 1 %
HCT: 30.7 % — ABNORMAL LOW (ref 36.0–46.0)
Hemoglobin: 9.6 g/dL — ABNORMAL LOW (ref 12.0–15.0)
Immature Granulocytes: 1 %
Lymphocytes Relative: 18 %
Lymphs Abs: 1.2 10*3/uL (ref 0.7–4.0)
MCH: 31.9 pg (ref 26.0–34.0)
MCHC: 31.3 g/dL (ref 30.0–36.0)
MCV: 102 fL — ABNORMAL HIGH (ref 80.0–100.0)
Monocytes Absolute: 0.4 10*3/uL (ref 0.1–1.0)
Monocytes Relative: 7 %
Neutro Abs: 4.6 10*3/uL (ref 1.7–7.7)
Neutrophils Relative %: 72 %
Platelets: 172 10*3/uL (ref 150–400)
RBC: 3.01 MIL/uL — ABNORMAL LOW (ref 3.87–5.11)
RDW: 15.9 % — ABNORMAL HIGH (ref 11.5–15.5)
WBC: 6.3 10*3/uL (ref 4.0–10.5)
nRBC: 0 % (ref 0.0–0.2)

## 2022-07-29 MED ORDER — DIPHENHYDRAMINE HCL 25 MG PO CAPS
25.0000 mg | ORAL_CAPSULE | Freq: Four times a day (QID) | ORAL | Status: DC | PRN
  Administered 2022-07-29: 25 mg via ORAL
  Filled 2022-07-29: qty 1

## 2022-07-29 MED ORDER — EPOETIN ALFA 10000 UNIT/ML IJ SOLN
30000.0000 [IU] | Freq: Once | INTRAMUSCULAR | Status: AC
  Administered 2022-07-29: 30000 [IU] via SUBCUTANEOUS
  Filled 2022-07-29: qty 3

## 2022-07-29 MED ORDER — IMMUNE GLOBULIN (HUMAN) 5 GM/50ML IV SOLN
65.0000 g | Freq: Once | INTRAVENOUS | Status: AC
  Administered 2022-07-29: 65 g via INTRAVENOUS
  Filled 2022-07-29: qty 400

## 2022-07-29 MED ORDER — PREDNISONE 1 MG PO TABS: 5.0000 mg | ORAL_TABLET | Freq: Every day | ORAL | 1 refills | Status: DC

## 2022-07-29 MED ORDER — ACETAMINOPHEN 325 MG PO TABS
650.0000 mg | ORAL_TABLET | Freq: Four times a day (QID) | ORAL | Status: DC | PRN
  Administered 2022-07-29: 650 mg via ORAL
  Filled 2022-07-29: qty 2

## 2022-07-29 MED ORDER — DEXTROSE 5 % IV SOLN
INTRAVENOUS | Status: DC
  Filled 2022-07-29: qty 250

## 2022-07-29 NOTE — Assessment & Plan Note (Signed)
Encourage oral hydration. Avoid nephrotoxins. 

## 2022-07-29 NOTE — Patient Instructions (Signed)

## 2022-07-29 NOTE — Telephone Encounter (Signed)
FMLA form completed signed and faxed to [facility] a copy placed at registration desk for patient to pick up. MyChart ms sent  to patient to let her know it is ready for her

## 2022-07-29 NOTE — Progress Notes (Signed)
Hematology/Oncology Progress note [phone] [phone]      Clinic Day:  07/29/2022  ASSESSMENT & PLAN:   Autoimmune hemolytic anemia (HCC) LDH is normal, adequate B12 and folate level, normal iron panel.  There is increased reticulocyte %-5%.  Cold agglutinin titer negative. Bilirubin is normal. 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  12/22/21 Negative PET scan  Hb is stable currently, hold off Ritxumab for now.- she understands that Rituximab may need to initiated if current measures are not able keep her hemoglobin stable.  Recommend patient to decrease to prednisone 5 mg daily.  IVIG 26m/kg x 1 for maintenance.   Bone marrow biopsy did not reveal any lymphoproliferative diseases.  Mild hypocellular marrow, erythroid hyperplasia, mild evidence of spherocytosis, consistent with hemolysis.   Anemia in chronic kidney disease (CKD) 30,000 units Epogen monthly if Hb<10  Hepatitis B core antibody positive Positive hepatitis C antibody, history of hepatitis B previously on entecavir after her previous rituximab treatments. Follow up with ID  Stage 3a chronic kidney disease (CKD) (HCC) Encourage oral hydration.  Avoid nephrotoxins.     Orders Placed This Encounter  Procedures   CBC with Differential/Platelet    Standing Status:   Future    Standing Expiration Date:   07/30/2023   Haptoglobin    Standing Status:   Future    Standing Expiration Date:   07/30/2023   CBC with Differential/Platelet    Standing Status:   Future    Standing Expiration Date:   07/30/2023   Comprehensive metabolic panel    Standing Status:   Future    Standing Expiration Date:   07/29/2023   Haptoglobin    Standing Status:   Future    Standing Expiration Date:   07/30/2023   Sample to Blood Bank    Standing Status:   Future    Standing  Expiration Date:   07/30/2023   Sample to Blood Bank    Standing Status:   Future    Standing Expiration Date:   07/30/2023   Follow up  Per LOS  All questions were answered. The patient knows to call the clinic with any problems, questions or concerns.  ZEarlie Server MD, PhD [facility] Oncology 07/29/2022   Chief Complaint: Tina SUIis a 66y.o. 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  Reticulocyte count was 11.3%.  Ferritin was 562.  Iron studies revealed a saturation of 20% and a TIBC of 214 (low).  B12 was 231 (low normal).  Folate was 42.   Peripheral smear revealed rouleaux formation.    Additional testing included the following + studies:  hepatitis C antibody, hepatitis B core antibody, CMV IgM, and EBV VCA (IgM and IgG).  Hepatitis B by PCR was negative.  Hepatitis C RNA was negative.  Mycoplasma pneumonia IgM was negative.  Reticulocyte count was 11.3% (high) indicating appropriate marrow response.  C3 and C4 were normal.  Cold agglutinin titer was negative x 2.  Negative studies included:  ANA, hepatitis B surface antigen, SPEP, and free light chain ratio.   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  MMA was initially 330 (normal).  Repeat MMA was elevated on 08/01/2016 confirming B12 deficiency.  Hepatitis B surface antibody was positive and hepatitis B surface antigen was negative on 08/06/2016.   07/07/2016  Chest, abdomen, and pelvis CT angiogram on revealed moderate diffuse atherosclerotic vascular disease of the abdominal aorta with severe stenosis of the left common iliac artery with suspected  short segment occlusion. There was no adenopathy.  Spleen was normal.   04/15/2017 Abd US revealed a normal spleen and sludge in the gallbladder.  The liver was echogenic consistent fatty infiltration and/or hepatocellular disease.  For hemolytic anemia treatments, in 2017, She has received multiple units of  warmed PRBCs to date.  08/01/2016-03/12/2017, steroid treatment with prednisone.  She is also on folic acid 1 mg daily. Borderline low vitamin B12 level on 07/09/2016.  Patient has been on oral vitamin B12 supplementation.  10/12/2017  positive warm autoantibody.  LDH and bilirubin were normal.   She began prednisone 10 mg on 12/07/2017 (discontinued 03/2018).  She received Rituxan weekly x 4 (last dose 06/16/2018).  Entecavir was discontinued on 07/06/2019.  06/12/2020 developed recurrent anemia.  Hematocrit was 27.6, hemoglobin 8.4, MCV 104.5, platelets 138,000, WBC 3,400 (ANC 2,300). Creatinine was 1.17 (CrCl 50 ml/min).  Positive warm autoantibody  Reticulocyte count was 7.1%.  LDH was 204 (98-192).  06/16/2020, began prednisone 1 mg/kilogram treatments, 07/24/2020, prednisone was tapered down to 20 mg.  She received Rituxan 1000 mg on day 1 and 15 (07/15/2020 and 07/29/2020).  She began entecavir on 06/28/2020.  Prednisone was tapered off.  She was on Septra for PCP prophylaxis which is now currently on hold    Other medical problems 07/10/2016.  She underwent PTCA and stent placement in right and left common iliac arteries and left external iliac artery. She is on Plavix.   03/19/2015 EGD revealed gastritis in the body and antrum.  Colonoscopy revealed one hyperplastic polyp.  Repeat EGD on 07/08/2016 was normal.  No evidence of bleeding.  History of she developed peri-oral and intranasal herpes simplex-1.  She was treated with valacyclovir and doxycycline on 10/19/2016.   10/26/2017. She developed flu like symptoms.  Symptoms included cough, myalgias, and fever (tmax unknown).  She was  prescribed Mucinex, Tamiflu, and amoxicillin.  She only took amoxicillin x 5 days.  She developed increased liver function tests on 11/02/2017. CMV IgG was positive.  EBV VCA IgG, NA IgG, early antigen antibody IgG were elevated.  EBV VCA IgM was < 36.  Testing was c/w a convalescence/past infection or reactivated infection.  LFTs normalized on 11/15/2017.  Smooth muscle antibody was 39 (high) on 11/10/2017 and 34 (high) on 11/30/2017.     Chest CT on 11/16/2017 revealed a 2.2 x 2.0 x 2.4 spiculated RIGHT middle lobe mass.  There was tiny nonspecific upper lobe pulmonary nodules greater on RIGHT, largest 3 mm, of uncertain etiology.  There was additional 8 x 8 mm opacity in the posterior sulcus of the RIGHT lower lobe. PET scan on 11/22/2017 revealed a 2 cm hypermetabolic right middle lobe lung mass (SUV 3.4) consistent  with primary lung neoplasm.  There were no findings for mediastinal/hilar lymphadenopathy or metastatic disease.  There were areas of hypermetabolism involving the left oblique abdominal muscles and the anorectal junction. PFTs on 11/18/2017 revealed an FEV1 of 1.22 liters (51%).  DLCO adj was 6.8 mL/mmHg/min (32%).   ENB done on 12/07/2017. Cytology was negative for malignancy. 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 Pathologist commented that organizing pneumonia spanned about 2 mm in one fragment. Cases of focal organizing pneumonia with hypermetabolism on FDG-PET have been described, but changes of this type can also be adjacent to a neoplasm. Patient prescribed a daily dose of Prednisone 10 mg   07/24/2020  repeat  chest CT on  revealed Lung-RADS 2, benign appearance or behavior. There was emphysema, aortic atherosclerosis, and coronary artery calcifications.  diarrhea.  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  She received azithromycin x 3 days.  She received ciprofloxacin x 10 days without improvement.  She has seen ID in  [place].  She began Bactrim on 03/04/2018 (discontinued on 03/11/2018) secondary to increase in creatine.   Chronic kidney disease Urinalysis on 11/15/2017 revealed revealed no hemoglobin, bilirubin or active sediment.   Liver function tests increased on 02/15/2019.  Work-up on 02/16/2019 revealed hepatitis B E antibody positive.  Negative studies included: hepatitis A total antibody, hepatitis A IgM, hepatitis B surface antigen, hepatitis B E antigen.  Hepatitis B DNA was not detected.  Folate was 80.5 and vitamin B12 was 443.  She has received her vaccinations:  She has completed the PCV13, PPSV23, and HiB.  She received Menvio (quadrivalent meningoccal vaccine) and Bexero (univalent serogroup B meningoccal vaccine) on 07/06/2019.  She was diagnosed with C difficile + diarrhea on 08/12/2020.  She completed a course of oral vancomycin.  Stool was negative for C. difficile on 08/27/2020.  11/17/2020 - 11/20/2020 [place] admission with left L5 varicella zoster.  Lesion was positive for VZV. She was treated with ceftazidime and a valacyclovir.  She was discharged on 9 days of Valtrex 1 gm 3 times daily followed by 500 mg daily indefinitely for suppression and doxycycline.  History of discoid lupus  #Positive hepatitis B core antibody Previously on entecavir 0.5 mg daily, currently she has finished a course. Dose adjusted based on renal function. CrCl 30-49 ml/min   50% dosing (0.5 mg QOD) CrCl >= 50 ml/min 100% dosing (0.5 mg a day). With her kidney function currently QOD She has completed course of Entecavir and stopped 08/14/2021  # PCP prophylaxis Patient was on Septra DS on Mondays, Wednesdays, and Fridays, which was held due to fluctuated kidney functions.  Currently off Septra.  12/03/2021, CT chest lung cancer screening showed new right middle lobe pulmonary nodule with an aggressive appearance characterized as lung RADS 4 VS.  12/22/2021 PET showed No abnormal FDG avidity associated  with the resolving right middle lobe pulmonary nodule now with a 12 mm ground-glass opacity in the area of prior solid nodularity, most consistent with an infectious or inflammatory etiology  INTERVAL HISTORY KAYTLYNNE NEACE is a 66 y.o. female who has above history reviewed by me today presents for follow up visit  Patient remained on prednisone 62m daily. +weight gain, + mild back pain 4 out of 10 She would like to further wean off prednisone due to weight gain. Chronic fatigue unchanged. S/p IVIG, tolerated well.  Chronic intermittent diarrhea with certain food.   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/23/2017   Post PTCA 07/12/2016   iliac stents bilaterally 2017   Thrombocytopenia ([place]) 10/04/2016   Wears dentures    full upper and lower    Past Surgical History:  Procedure Laterality Date   ELECTROMAGNETIC NAVIGATION BROCHOSCOPY N/A 12/07/2017   Procedure: ELECTROMAGNETIC NAVIGATION BRONCHOSCOPY;  Surgeon: Flora Lipps, MD;  Location: [facility] ORS;  Service: Cardiopulmonary;  Laterality: N/A;   ESOPHAGOGASTRODUODENOSCOPY (EGD) WITH PROPOFOL N/A 07/08/2016   Procedure: ESOPHAGOGASTRODUODENOSCOPY (EGD) WITH PROPOFOL;  Surgeon: Manya Silvas, MD;  Location: [facility] ENDOSCOPY;  Service: Endoscopy;  Laterality: N/A;   KNEE SURGERY Right    repair of acl tear   PERIPHERAL VASCULAR CATHETERIZATION N/A 07/10/2016   Procedure: Lower Extremity Angiography;  Surgeon: Katha Cabal, MD;  Location:  [place] [facility];  Service: Cardiovascular;  Laterality: N/A;   PERIPHERAL VASCULAR CATHETERIZATION N/A 07/10/2016   Procedure: Abdominal Aortogram w/Lower Extremity;  Surgeon: Katha Cabal, MD;  Location: [place] [facility];  Service: Cardiovascular;  Laterality: N/A;   PERIPHERAL VASCULAR CATHETERIZATION  07/10/2016   Procedure: Lower Extremity Intervention;  Surgeon: Katha Cabal, MD;  Location: [place] [facility];  Service: Cardiovascular;;    Family History  Problem Relation Age of Onset   Diabetes Mother    Hypertension Mother    Diabetes Maternal Grandfather    Hypertension Maternal Grandfather    Breast cancer Neg Hx     Social History:  reports that she quit smoking about 6 years ago. Her smoking use included cigarettes. She has a 58.75 pack-year smoking history. She has never used smokeless tobacco. She reports that she does not drink alcohol and does not use drugs.  Former smoker, 58-pack-year smoking history.   She has a daughter, [name] and a daughter named [name].  She lives in [place]. The patient is alone today.  Allergies:  Allergies  Allergen Reactions   Ciprofloxacin Swelling    Facial swelling following single oral dose.    Codeine Anaphylaxis   Nsaids Other (See Comments)    Patient has Hep C.     Current Medications: Current Outpatient Medications  Medication Sig Dispense Refill   albuterol (VENTOLIN HFA) 108 (90 Base) MCG/ACT inhaler SMARTSIG:2 Puff(s) By Mouth Every 4 Hours PRN     allopurinol (ZYLOPRIM) 100 MG tablet TAKE 1 TABLET BY MOUTH EVERY DAY 30 tablet 5   clopidogrel (PLAVIX) 75 MG tablet Take 1 tablet (75 mg total) by mouth daily. 30 tablet 5   cyanocobalamin 500 MCG tablet Take 500 mcg by mouth daily.     Fluticasone-Umeclidin-Vilant (TRELEGY ELLIPTA) 100-62.5-25 MCG/ACT AEPB Inhale 1 puff into the lungs daily. 14 each 0   hydroxychloroquine (PLAQUENIL) 200 MG tablet Take 200 mg by mouth 2 (two) times daily.      levothyroxine (SYNTHROID, LEVOTHROID) 75 MCG tablet Take 75 mcg by mouth daily before breakfast.      lisinopril-hydrochlorothiazide (ZESTORETIC) 20-25 MG tablet Take 1 tablet by mouth daily. 90 tablet 4   pantoprazole (PROTONIX) 40 MG tablet Take 1 tablet (40 mg total) by mouth daily. 30 tablet 3   predniSONE (DELTASONE) 1 MG tablet Take 5 tablets (5 mg total) by mouth daily with breakfast. 150 tablet 1   rosuvastatin (CRESTOR) 40 MG tablet Take 1 tablet (40 mg total) by mouth daily. 90 tablet 3   entecavir (BARACLUDE) 0.5 MG tablet Take by  mouth. (Patient not taking: Reported on 07/29/2022)     Fluticasone-Umeclidin-Vilant (TRELEGY ELLIPTA) 100-62.5-25 MCG/ACT AEPB Inhale 1 puff into the lungs daily. (Patient not taking: Reported on 07/29/2022) 28 each 11   folic acid (FOLVITE) 1 MG tablet TAKE 1 TABLET (1 MG TOTAL) BY MOUTH DAILY. (Patient not taking: Reported on 06/03/2022) 90 tablet 1   No current facility-administered medications for this visit.   Facility-Administered Medications Ordered in Other Visits  Medication Dose Route Frequency Provider Last Rate Last Admin   acetaminophen (TYLENOL) tablet 650 mg  650 mg Oral Q6H PRN Earlie Server, MD       dextrose 5 % solution   Intravenous Continuous Earlie Server, MD 20 mL/hr at 07/29/22 0912 New Bag at 07/29/22 0912   diphenhydrAMINE (BENADRYL) capsule 25 mg  25 mg Oral Q6H PRN Earlie Server, MD       epoetin alfa (EPOGEN) injection 30,000 Units  30,000 Units Subcutaneous Once Earlie Server, MD       Immune Globulin 10% (PRIVIGEN) IV infusion 65 g  1 g/kg (Adjusted) Intravenous Once Earlie Server, MD        Review of Systems  Constitutional:  Negative for appetite change, chills, fatigue and fever.  HENT:   Negative for hearing loss and voice change.   Eyes:  Negative for eye problems.  Respiratory:  Negative for chest tightness and cough.   Cardiovascular:  Negative for chest pain.  Gastrointestinal:  Negative for abdominal distention, abdominal pain and blood  in stool.  Endocrine: Negative for hot flashes.  Genitourinary:  Negative for difficulty urinating and frequency.   Musculoskeletal:  Negative for arthralgias.  Skin:  Negative for itching and rash.  Neurological:  Negative for extremity weakness.  Hematological:  Negative for adenopathy.  Psychiatric/Behavioral:  Negative for confusion. The patient is not nervous/anxious.      Performance status (ECOG): 1  Vital Signs Blood pressure 137/63, pulse 87, temperature (!) 96.8 F (36 C), resp. rate 18, weight 186 lb 11.2 oz (84.7 kg).  Physical Exam Constitutional:      General: She is not in acute distress.    Appearance: She is obese. She is not diaphoretic.  HENT:     Head: Normocephalic and atraumatic.     Nose: Nose normal.     Mouth/Throat:     Pharynx: No oropharyngeal exudate.  Eyes:     General: No scleral icterus.    Pupils: Pupils are equal, round, and reactive to light.  Cardiovascular:     Rate and Rhythm: Normal rate and regular rhythm.     Heart sounds: No murmur heard. Pulmonary:     Effort: Pulmonary effort is normal. No respiratory distress.  Abdominal:     General: There is no distension.     Palpations: Abdomen is soft.     Tenderness: There is no abdominal tenderness.  Musculoskeletal:        General: Normal range of motion.     Cervical back: Normal range of motion and neck supple.  Skin:    General: Skin is warm and dry.     Findings: No erythema.  Neurological:     Mental Status: She is alert and oriented to person, place, and time. Mental status is at baseline.     Cranial Nerves: No cranial nerve deficit.     Motor: No abnormal muscle tone.  Psychiatric:        Mood and Affect: Mood and affect normal.      Laboratory findings  Latest Ref Rng & Units 07/29/2022    8:00 AM 06/24/2022    9:31 AM 06/03/2022    9:59 AM  CBC  WBC 4.0 - 10.5 K/uL 6.3  7.1  11.8   Hemoglobin 12.0 - 15.0 g/dL 9.6  9.9  10.6   Hematocrit 36.0 - 46.0 % 30.7   31.9  33.9   Platelets 150 - 400 K/uL 172  123  146       Latest Ref Rng & Units 07/29/2022    8:00 AM 06/03/2022    9:59 AM 05/20/2022   10:48 AM  CMP  Glucose 70 - 99 mg/dL 88  79  98   BUN 8 - 23 mg/dL _0 Creatinine 0.44 - 1.00 mg/dL 1.34  1.11  1.33   Sodium 135 - 145 mmol/L 143  142  142   Potassium 3.5 - 5.1 mmol/L 3.7  3.7  3.7   Chloride 98 - 111 mmol/L 105  103  105   CO2 22 - 32 mmol/L 30  33  32   Calcium 8.9 - 10.3 mg/dL 9.5  9.5  9.3   Total Protein 6.5 - 8.1 g/dL 7.3  6.5  7.0   Total Bilirubin 0.3 - 1.2 mg/dL 0.9  1.0  0.7   Alkaline Phos 38 - 126 U/L 50  39  44   AST 15 - 41 U/L _1 ALT 0 - 44 U/L 13  15  11

## 2022-07-29 NOTE — Telephone Encounter (Signed)
FMLA Form received and completed, sent for physician signature

## 2022-07-29 NOTE — Assessment & Plan Note (Signed)
30,000 units Epogen monthly if Hb<10

## 2022-07-29 NOTE — Progress Notes (Signed)
Pt here for follow up. Reports that she has been having back pain. 4/10 today

## 2022-07-29 NOTE — Assessment & Plan Note (Signed)
Positive hepatitis C antibody, history of hepatitis B previously on entecavir after her previous rituximab treatments. Follow up with ID

## 2022-07-29 NOTE — Assessment & Plan Note (Addendum)
LDH is normal, adequate B12 and folate level, normal iron panel.  There is increased reticulocyte %-5%.  Cold agglutinin titer negative. Bilirubin is normal. 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  12/22/21 Negative PET scan  Hb is stable currently, hold off Ritxumab for now.- she understands that Rituximab may need to initiated if current measures are not able keep her hemoglobin stable.  Recommend patient to decrease to prednisone 5 mg daily.  IVIG 34m/kg x 1 for maintenance.   Bone marrow biopsy did not reveal any lymphoproliferative diseases.  Mild hypocellular marrow, erythroid hyperplasia, mild evidence of spherocytosis, consistent with hemolysis.

## 2022-07-30 LAB — SAMPLE TO BLOOD BANK

## 2022-08-18 NOTE — Progress Notes (Signed)
Cardiology Clinic Note   Patient Name: Tina Page Date of Encounter: 08/25/2022  Primary Care Provider:  Frazier Richards, MD Primary Cardiologist:  None  Patient Profile    66 year old female with past medical history of COPD, hyperlipidemia, PAD status post PTCA, hypertension, hemolytic anemia, aortic atherosclerosis, status post bilateral iliac artery stents using kissing balloon technique 07/2016, coronary artery calcification on CT, CKD stage III who presents today for follow-up.  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/23/2017   Post PTCA 07/12/2016   iliac stents bilaterally 2017   Thrombocytopenia ([place]) 10/04/2016   Wears dentures    full upper and lower   Past Surgical History:  Procedure Laterality Date   ELECTROMAGNETIC NAVIGATION BROCHOSCOPY N/A 12/07/2017   Procedure: ELECTROMAGNETIC NAVIGATION BRONCHOSCOPY;  Surgeon: Flora Lipps, MD;  Location: [facility] ORS;  Service: Cardiopulmonary;  Laterality: N/A;   ESOPHAGOGASTRODUODENOSCOPY (EGD) WITH PROPOFOL N/A 07/08/2016   Procedure: ESOPHAGOGASTRODUODENOSCOPY (EGD) WITH PROPOFOL;  Surgeon: Manya Silvas, MD;  Location: [facility] ENDOSCOPY;  Service: Endoscopy;  Laterality: N/A;   KNEE SURGERY Right    repair of acl tear   PERIPHERAL VASCULAR CATHETERIZATION N/A 07/10/2016   Procedure: Lower Extremity Angiography;  Surgeon: Katha Cabal, MD;  Location: [place] [facility];  Service: Cardiovascular;   Laterality: N/A;   PERIPHERAL VASCULAR CATHETERIZATION N/A 07/10/2016   Procedure: Abdominal Aortogram w/Lower Extremity;  Surgeon: Katha Cabal, MD;  Location: [place] [facility];  Service: Cardiovascular;  Laterality: N/A;   PERIPHERAL VASCULAR CATHETERIZATION  07/10/2016   Procedure: Lower Extremity Intervention;  Surgeon: Katha Cabal, MD;  Location: [place] [facility];  Service: Cardiovascular;;    Allergies  Allergies  Allergen Reactions   Ciprofloxacin Swelling    Facial swelling following single oral dose.    Codeine Anaphylaxis   Nsaids Other (See Comments)    Patient has Hep C.     History of Present Illness    Tina Page. Nicol is a 66 year old female with a previously mentioned past medical history of COPD, hyperlipidemia, PAD status post PTCA, hypertension, hemolytic anemia, aortic atherosclerosis, status post bilateral iliac artery stents using the kissing balloon technique in 07/2016, coronary artery calcification on CT, and CKD stage III.  She follows with multiple providers that she sees hematology for hemolytic anemia, pulmonary for lung nodules that were noncancerous on bronchoscopy, Dr. Holley Raring of nephrology for CKD, and vascular for her PAD.  She was last seen in clinic 08/19/2021 where she reported chronic shortness of breath that was stable.  At that time she was continued to use her inhaler as needed.  Her cholesterol medication was changed from pravastatin to rosuvastatin 40 mg daily.  She was also scheduled for a stress testing.  Unfortunately,that testing was never completed.  She returns to clinic today stating that she has been doing fairly well from the cardiac standpoint. She continues to have shortness of breath related to her COPD  that has improved since being started on Trelegy by Pulmonary. She denies any chest pain, chest pressure, or palpitations. She endorses peripheral edema that occurs during the day but resolves overnight. 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 She denies any recent hospitalizations or visits to the emergency department.   Home Medications    Current Outpatient Medications  Medication Sig Dispense Refill   albuterol (VENTOLIN HFA) 108 (90 Base) MCG/ACT inhaler SMARTSIG:2 Puff(s) By Mouth Every 4 Hours PRN     allopurinol (ZYLOPRIM) 100 MG tablet TAKE 1 TABLET BY MOUTH EVERY DAY 30 tablet 5   CALCIUM PO Take by mouth daily at 12 noon.     clopidogrel (PLAVIX) 75 MG tablet Take 1 tablet (75 mg total) by mouth daily. 30 tablet 5   cyanocobalamin 500 MCG tablet Take 500 mcg by mouth daily.     Fluticasone-Umeclidin-Vilant (TRELEGY ELLIPTA) 100-62.5-25 MCG/ACT AEPB Inhale 1 puff into the lungs daily. 14 each 0   hydroxychloroquine (PLAQUENIL) 200 MG tablet Take 200 mg by mouth 2 (two) times daily.     levothyroxine (SYNTHROID, LEVOTHROID) 75 MCG tablet Take 75 mcg by mouth daily before breakfast.      pantoprazole (PROTONIX) 40 MG tablet Take 1 tablet by mouth once daily 30 tablet 0   predniSONE (DELTASONE) 1 MG tablet Take 5 tablets (5 mg total) by mouth daily with breakfast. 150 tablet 1   VITAMIN D PO Take by mouth daily in the afternoon.     folic acid (FOLVITE) 1 MG tablet TAKE 1 TABLET (1 MG TOTAL) BY MOUTH DAILY. (Patient not taking: Reported on 06/03/2022) 90 tablet 1   lisinopril-hydrochlorothiazide (ZESTORETIC) 20-25 MG tablet Take 1 tablet by mouth daily. 90 tablet 3   rosuvastatin (CRESTOR) 40 MG tablet Take 1 tablet (40 mg total) by mouth daily. 90 tablet 3   No current facility-administered medications for this visit.     Family History    Family History  Problem Relation Age of Onset   Diabetes Mother    Hypertension Mother    Diabetes Maternal Grandfather    Hypertension Maternal Grandfather    Breast cancer Neg Hx    She indicated that her mother is alive. She indicated that her maternal grandfather is deceased. She indicated that the status of  her neg hx is unknown.  Social History    Social History   Socioeconomic History   Marital status: Widowed    Spouse name: Not on file   Number of children: Not on file   Years of education: Not on file   Highest education level: Not on file  Occupational History   Not on file  Tobacco Use   Smoking status: Former    Packs/day: 1.25    Years: 47.00    Total pack years: 58.75    Types: Cigarettes    Quit date: 07/11/2016    Years since quitting: 6.1   Smokeless tobacco: Never  Vaping Use   Vaping Use: Never used  Substance and Sexual Activity   Alcohol use: No   Drug use: No   Sexual activity: Not Currently    Birth control/protection: None  Other Topics Concern   Not on file  Social History Narrative   Not on file   Social Determinants of Health   Financial Resource Strain: Not on file  Food Insecurity: Not on file  Transportation Needs: Not on file  Physical Activity: Not on file  Stress: Not on file  Social Connections: Not on file  Intimate Partner Violence: Not on file     Review of Systems    General:  No chills, fever, night sweats or weight changes. Endorses fatigue. Cardiovascular:  No chest pain, endorses chronic dyspnea on exertion, endorses peripheral edema, orthopnea, palpitations, paroxysmal nocturnal dyspnea. Dermatological: No rash, lesions/masses Respiratory: No cough, endorses chronic dyspnea Urologic: No hematuria, dysuria Abdominal:   No nausea, vomiting, diarrhea, bright red blood per rectum, melena, or hematemesis Neurologic:  No visual changes, wkns, changes in mental status. All other systems reviewed and are otherwise negative except as noted above.   Physical Exam    VS:  BP 138/74 (BP Location: Left Arm, Patient Position: Sitting, Cuff Size: Normal)   Pulse 81   Ht '5\' 1"'$  (1.549 m)   Wt 189 lb 6 oz (85.9 kg)   SpO2 98%   BMI 35.78 kg/m  , BMI Body mass index is 35.78 kg/m.     Vitals:   08/25/22 0903 08/25/22 0910  BP: (!)  142/70 138/74    GEN: Well nourished, well developed, in no acute distress. HEENT: normal. Glasses on.  Neck: Supple, no JVD, bilateral carotid bruits noted , no masses. Cardiac: RRR, no murmurs, rubs, or gallops. No clubbing, cyanosis, edema.  Radials 2+/PT 2+ and equal bilaterally.  Respiratory:  Respirations regular and unlabored, clear to auscultation bilaterally. GI: Soft, nontender, nondistended, BS + x 4. MS: no deformity or atrophy. Skin: warm and dry and flaky , no rash.  Neuro:  Strength and sensation are intact. Psych: Normal affect.  Accessory Clinical Findings    ECG personally reviewed by me today- sinus rhythm rate of 81, baseline artifact  - No acute changes  Lab Results  Component Value Date   WBC 6.3 07/29/2022   HGB 9.6 (L) 07/29/2022   HCT 30.7 (L) 07/29/2022   MCV 102.0 (H) 07/29/2022   PLT 172 07/29/2022   Lab Results  Component Value Date   CREATININE 1.34 (H) 07/29/2022   BUN 27 (H) 07/29/2022   NA 143 07/29/2022   K 3.7 07/29/2022   CL 105 07/29/2022   CO2 30 07/29/2022   Lab Results  Component Value Date   ALT 13 07/29/2022   AST 19 07/29/2022   ALKPHOS 50 07/29/2022   BILITOT 0.9 07/29/2022   Lab Results  Component Value Date   CHOL 168 10/11/2019   HDL 36 (L) 10/11/2019   LDLCALC 104 (H) 10/11/2019   LDLDIRECT 102 (H) 10/11/2019   TRIG 160 (H) 10/11/2019   CHOLHDL 4.7 (H) 10/11/2019    No results found for: "HGBA1C"  Assessment & Plan   1.  Aortic atherosclerosis noted on previous CT scans. Moderate atherosclerosis particularly in the arch, at least moderate coronary calcification distal left main or bifurcation, proximal LAD and RCA. No recent lipid panel with prior history of elevated cholesterol. Lipid panel to be drawn at Oncology tomorrow as she has scheduled labs tomorrow. Continued on rosuvastatin 40 mg daily.   2. Essential hypertension with blood pressure 142/70 recheck of 138/74. Continued on lisinopril-HCTZ 20-25 1/2  tablet daily. Continue to monitor blood pressure. Refills sent into pharmacy of choice.   3. Mixed hyperlipidemia with no recent lipid panel. Lipid panel for tomorrow. Continued on rosuvastatin 40 mg daily refill sent in per patients request to pharmacy of choice.   4. Peripheral arterial disease with known carotid disease, as well as, with previous stenting done to the bilateral lower extremities by [facility]. Carotid duplex  completed 07/2021 revealed right ICA 1-39% and left ICA 40-59% stenosis. Will need follow up carotid duplex yearly, will schedule upcoming ultrasound today. Continued on clopidogrel 75 mg daily and statin therapy.   5. Chronic shortness of breath. Continues to be followed by Pulmonary. Inhalers have been changed and have made a difference.  Labs low-dose contrast CT of the chest 6 completed 7/23 which revealed-RADS 3, probably benign findings.  New 5.5 mm right lower lobe nodule.  Short-term follow-up in 6 months is recommended with repeat low-dose chest CT.  Interval decrease in size of 14.4 mm lateral segment right middle lobe nodule of concern on prior screening CT.  Aortic atherosclerosis and emphysema.  6.  Disposition patient return to clinic to see MD/APP in 11 to 12 months or sooner if needed.  Shandiin Eisenbeis, NP 08/25/2022, 9:45 AM

## 2022-08-20 ENCOUNTER — Other Ambulatory Visit: Payer: Self-pay | Admitting: Pulmonary Disease

## 2022-08-20 ENCOUNTER — Other Ambulatory Visit: Payer: Self-pay | Admitting: Cardiovascular Disease

## 2022-08-25 ENCOUNTER — Encounter: Payer: Self-pay | Admitting: Cardiology

## 2022-08-25 ENCOUNTER — Ambulatory Visit: Payer: BC Managed Care – PPO | Attending: Cardiology | Admitting: Cardiology

## 2022-08-25 ENCOUNTER — Other Ambulatory Visit: Payer: Self-pay | Admitting: *Deleted

## 2022-08-25 VITALS — BP 138/74 | HR 81 | Ht 61.0 in | Wt 189.4 lb

## 2022-08-25 DIAGNOSIS — I10 Essential (primary) hypertension: Secondary | ICD-10-CM

## 2022-08-25 DIAGNOSIS — I6523 Occlusion and stenosis of bilateral carotid arteries: Secondary | ICD-10-CM

## 2022-08-25 DIAGNOSIS — I700 Atherosclerosis of aorta: Secondary | ICD-10-CM

## 2022-08-25 DIAGNOSIS — I2510 Atherosclerotic heart disease of native coronary artery without angina pectoris: Secondary | ICD-10-CM

## 2022-08-25 DIAGNOSIS — R0989 Other specified symptoms and signs involving the circulatory and respiratory systems: Secondary | ICD-10-CM

## 2022-08-25 DIAGNOSIS — E782 Mixed hyperlipidemia: Secondary | ICD-10-CM

## 2022-08-25 DIAGNOSIS — I739 Peripheral vascular disease, unspecified: Secondary | ICD-10-CM

## 2022-08-25 MED ORDER — LISINOPRIL-HYDROCHLOROTHIAZIDE 20-25 MG PO TABS: 1.0000 | ORAL_TABLET | Freq: Every day | ORAL | 3 refills | Status: DC

## 2022-08-25 MED ORDER — ROSUVASTATIN CALCIUM 40 MG PO TABS: 40.0000 mg | ORAL_TABLET | Freq: Every day | ORAL | 3 refills | Status: AC

## 2022-08-25 NOTE — Patient Instructions (Signed)
Medication Instructions:   NONE  *If you need a refill on your cardiac medications before your next appointment, please call your pharmacy*   Lab Work:  Your physician recommends that you have fasting lipid panel drawn at your Oncologist office as directed.  *Fasting Lipid panel  If you have labs (blood work) drawn today and your tests are completely normal, you will receive your results only by: MyChart Message (if you have MyChart) OR A paper copy in the mail If you have any lab test that is abnormal or we need to change your treatment, we will call you to review the results.   Testing/Procedures:  NONE   Follow-Up: At [facility], you and your health needs are our priority.  As part of our continuing mission to provide you with exceptional heart care, we have created designated Provider Care Teams.  These Care Teams include your primary Cardiologist (physician) and Advanced Practice Providers (APPs -  Physician Assistants and Nurse Practitioners) who all work together to provide you with the care you need, when you need it.  We recommend signing up for the patient portal called "MyChart".  Sign up information is provided on this After Visit Summary.  MyChart is used to connect with patients for Virtual Visits (Telemedicine).  Patients are able to view lab/test results, encounter notes, upcoming appointments, etc.  Non-urgent messages can be sent to your provider as well.   To learn more about what you can do with MyChart, go to [email or web address].    Your next appointment:   12 month(s)  The format for your next appointment:   In Person  Provider:   You may see or one of the following Advanced Practice Providers on your designated Care Team:   Murray Hodgkins, NP Christell Faith, PA-C Cadence Kathlen Mody, PA-C Gerrie Nordmann, NP    Other Instructions  Important Information About Sugar

## 2022-08-25 NOTE — Addendum Note (Signed)
Addended by: Britt Bottom on: 08/25/2022 03:50 PM   Modules accepted: Orders

## 2022-08-26 ENCOUNTER — Inpatient Hospital Stay: Payer: BC Managed Care – PPO

## 2022-08-26 ENCOUNTER — Inpatient Hospital Stay: Payer: BC Managed Care – PPO | Attending: Oncology

## 2022-08-26 VITALS — BP 157/74 | HR 92

## 2022-08-26 DIAGNOSIS — I700 Atherosclerosis of aorta: Secondary | ICD-10-CM

## 2022-08-26 DIAGNOSIS — Z833 Family history of diabetes mellitus: Secondary | ICD-10-CM | POA: Insufficient documentation

## 2022-08-26 DIAGNOSIS — Z8249 Family history of ischemic heart disease and other diseases of the circulatory system: Secondary | ICD-10-CM | POA: Diagnosis not present

## 2022-08-26 DIAGNOSIS — N1831 Chronic kidney disease, stage 3a: Secondary | ICD-10-CM | POA: Diagnosis present

## 2022-08-26 DIAGNOSIS — I6523 Occlusion and stenosis of bilateral carotid arteries: Secondary | ICD-10-CM

## 2022-08-26 DIAGNOSIS — I2510 Atherosclerotic heart disease of native coronary artery without angina pectoris: Secondary | ICD-10-CM

## 2022-08-26 DIAGNOSIS — Z7952 Long term (current) use of systemic steroids: Secondary | ICD-10-CM | POA: Diagnosis not present

## 2022-08-26 DIAGNOSIS — D591 Autoimmune hemolytic anemia, unspecified: Secondary | ICD-10-CM

## 2022-08-26 DIAGNOSIS — R768 Other specified abnormal immunological findings in serum: Secondary | ICD-10-CM | POA: Insufficient documentation

## 2022-08-26 DIAGNOSIS — D5911 Warm autoimmune hemolytic anemia: Secondary | ICD-10-CM | POA: Diagnosis present

## 2022-08-26 DIAGNOSIS — I739 Peripheral vascular disease, unspecified: Secondary | ICD-10-CM

## 2022-08-26 DIAGNOSIS — D631 Anemia in chronic kidney disease: Secondary | ICD-10-CM | POA: Insufficient documentation

## 2022-08-26 DIAGNOSIS — I10 Essential (primary) hypertension: Secondary | ICD-10-CM

## 2022-08-26 DIAGNOSIS — Z87891 Personal history of nicotine dependence: Secondary | ICD-10-CM | POA: Insufficient documentation

## 2022-08-26 DIAGNOSIS — E782 Mixed hyperlipidemia: Secondary | ICD-10-CM

## 2022-08-26 LAB — LIPID PANEL
Cholesterol: 128 mg/dL (ref 0–200)
HDL: 66 mg/dL (ref >40)
LDL Cholesterol: 35 mg/dL (ref 0–99)
Total CHOL/HDL Ratio: 1.9 RATIO
Triglycerides: 137 mg/dL (ref <150)
VLDL: 27 mg/dL (ref 0–40)

## 2022-08-26 LAB — SAMPLE TO BLOOD BANK

## 2022-08-26 LAB — CBC WITH DIFFERENTIAL/PLATELET
Abs Immature Granulocytes: 0.02 10*3/uL (ref 0.00–0.07)
Basophils Absolute: 0.1 10*3/uL (ref 0.0–0.1)
Basophils Relative: 1 %
Eosinophils Absolute: 0.1 10*3/uL (ref 0.0–0.5)
Eosinophils Relative: 2 %
HCT: 30.8 % — ABNORMAL LOW (ref 36.0–46.0)
Hemoglobin: 9.5 g/dL — ABNORMAL LOW (ref 12.0–15.0)
Immature Granulocytes: 0 %
Lymphocytes Relative: 12 %
Lymphs Abs: 0.7 10*3/uL (ref 0.7–4.0)
MCH: 31.1 pg (ref 26.0–34.0)
MCHC: 30.8 g/dL (ref 30.0–36.0)
MCV: 101 fL — ABNORMAL HIGH (ref 80.0–100.0)
Monocytes Absolute: 0.4 10*3/uL (ref 0.1–1.0)
Monocytes Relative: 6 %
Neutro Abs: 5 10*3/uL (ref 1.7–7.7)
Neutrophils Relative %: 79 %
Platelets: 149 10*3/uL — ABNORMAL LOW (ref 150–400)
RBC: 3.05 MIL/uL — ABNORMAL LOW (ref 3.87–5.11)
RDW: 15.6 % — ABNORMAL HIGH (ref 11.5–15.5)
WBC: 6.3 10*3/uL (ref 4.0–10.5)
nRBC: 0 % (ref 0.0–0.2)

## 2022-08-26 MED ORDER — EPOETIN ALFA 10000 UNIT/ML IJ SOLN
30000.0000 [IU] | Freq: Once | INTRAMUSCULAR | Status: AC
  Administered 2022-08-26: 30000 [IU] via SUBCUTANEOUS
  Filled 2022-08-26: qty 3

## 2022-08-26 NOTE — Progress Notes (Signed)
Cholesterol is at goal. Continue medication regimen without changes at this time.

## 2022-08-27 LAB — HAPTOGLOBIN: Haptoglobin: <10 mg/dL — ABNORMAL LOW (ref 37–355)

## 2022-08-27 NOTE — Addendum Note (Signed)
Addended by: James Ivanoff D on: 08/27/2022 03:56 PM   Modules accepted: Orders

## 2022-09-01 ENCOUNTER — Telehealth: Payer: Self-pay | Admitting: *Deleted

## 2022-09-01 NOTE — Telephone Encounter (Signed)
Received fax requesting more records for her disability claim. Records faxed

## 2022-09-07 ENCOUNTER — Ambulatory Visit
Admission: RE | Admit: 2022-09-07 | Discharge: 2022-09-07 | Disposition: A | Discharge status: Home / Self Care | Payer: BC Managed Care – PPO | Source: Ambulatory Visit | Attending: Acute Care | Admitting: Acute Care

## 2022-09-07 DIAGNOSIS — R911 Solitary pulmonary nodule: Secondary | ICD-10-CM | POA: Diagnosis present

## 2022-09-08 ENCOUNTER — Telehealth: Payer: Self-pay | Admitting: Acute Care

## 2022-09-08 NOTE — Telephone Encounter (Signed)
Spoke with [facility]- call report on LDCT done 09/07/22 Impression:  IMPRESSION: 1. Lung-RADS 4A, suspicious. Follow up low-dose chest CT without contrast in 3 months (please use the following order, "CT CHEST LCS NODULE FOLLOW-UP W/O CM") is recommended. Mild interval growth of an irregular small solid anterior right lower lobe pulmonary nodule measuring 5.2 mm in volume derived mean diameter, which is below PET resolution. 2. Three-vessel coronary atherosclerosis. 3. Cholelithiasis. 4. Aortic Atherosclerosis (ICD10-I70.0) and Emphysema (ICD10-J43.9).   These results will be called to the ordering clinician or representative by the Radiologist Assistant, and communication documented in the PACS or [employer].     Electronically Signed   By: Ilona Sorrel M.D.   On: 09/08/2022 09:37

## 2022-09-10 ENCOUNTER — Telehealth: Payer: Self-pay | Admitting: *Deleted

## 2022-09-10 ENCOUNTER — Ambulatory Visit: Payer: BC Managed Care – PPO | Attending: Cardiology

## 2022-09-10 DIAGNOSIS — R0989 Other specified symptoms and signs involving the circulatory and respiratory systems: Secondary | ICD-10-CM | POA: Diagnosis not present

## 2022-09-10 NOTE — Telephone Encounter (Signed)
Received new Disability Claim form, completed and sent for physician signature

## 2022-09-11 ENCOUNTER — Encounter: Payer: Self-pay | Admitting: Oncology

## 2022-09-11 NOTE — Telephone Encounter (Signed)
Form signed and faxed back to [facility] My Chart message sent to patient to let her know

## 2022-09-14 ENCOUNTER — Ambulatory Visit: Payer: BC Managed Care – PPO | Admitting: Pulmonary Disease

## 2022-09-14 ENCOUNTER — Other Ambulatory Visit: Payer: Self-pay | Admitting: *Deleted

## 2022-09-14 ENCOUNTER — Encounter: Payer: Self-pay | Admitting: Pulmonary Disease

## 2022-09-14 VITALS — BP 120/70 | HR 75 | Temp 97.8°F | Ht 61.0 in | Wt 188.6 lb

## 2022-09-14 DIAGNOSIS — R918 Other nonspecific abnormal finding of lung field: Secondary | ICD-10-CM | POA: Diagnosis not present

## 2022-09-14 DIAGNOSIS — J4489 Other specified chronic obstructive pulmonary disease: Secondary | ICD-10-CM | POA: Diagnosis not present

## 2022-09-14 DIAGNOSIS — M359 Systemic involvement of connective tissue, unspecified: Secondary | ICD-10-CM

## 2022-09-14 DIAGNOSIS — J4540 Moderate persistent asthma, uncomplicated: Secondary | ICD-10-CM | POA: Diagnosis not present

## 2022-09-14 DIAGNOSIS — I6523 Occlusion and stenosis of bilateral carotid arteries: Secondary | ICD-10-CM

## 2022-09-14 DIAGNOSIS — R0602 Shortness of breath: Secondary | ICD-10-CM

## 2022-09-14 MED ORDER — BREZTRI AEROSPHERE 160-9-4.8 MCG/ACT IN AERO: 2.0000 | INHALATION_SPRAY | Freq: Two times a day (BID) | RESPIRATORY_TRACT | 0 refills | Status: DC

## 2022-09-14 NOTE — Progress Notes (Unsigned)
Subjective:    Patient ID: Tina Page, female    DOB: [birth year], 67 y.o.   MRN: [id number] Patient Care Team: Frazier Richards, MD as PCP - General (Family Medicine) Leonel Ramsay, MD (Infectious Diseases) Telford Nab, RN as Registered Nurse Lucilla Lame, MD as Consulting Physician (Gastroenterology) Tsosie Billing, MD as Consulting Physician (Infectious Diseases) Minna Merritts, MD as Consulting Physician (Cardiology)  Chief Complaint  Patient presents with   Follow-up    SOB with exertion. Wheezing. Some dry cough.     HPI Tina Page is a 67 year old female, former smoker quit in 2017 (63-pack-year history). 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 coli, impetigo, autoimmune hemolytic anemia, B12 deficiency, elevated liver function testing, hyperlipidemia, pulmonary nodules and undifferentiated connective tissue disease. Previously followed by Dr. Mortimer Fries (last seen 2019), last seen by me on 10 July 2022.  At that time she was given a trial of Trelegy Ellipta which she has tolerated well.  She is not sure whether this helps more than the Arnuity however she cannot afford it and will not be able to keep taking it.  No exacerbations since her prior visit.  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   She uses albuterol at least once a day due to shortness of breath and wheezing this is effective, this has not changed since she started Trelegy.  She had significant bronchodilator response noted on her PFTs. She has not had any fevers, chills or sweats since her last visit.  No cough as long as she is on ICS inhaler. No sputum production no hemoptysis.  She does not endorse any other symptomatology today. Her dyspnea is pretty much at baseline but still quite bothersome to her.   She has had evanescent pulmonary nodules that have  been followed closely by the lung cancer screening program.  Also prior electromagnetic navigation bronchoscopy in April 2019 by Dr. Mortimer Fries with biopsy of the right middle lobe nodule that showed organizing pneumonia and chronic bronchitis.     DATA PFTS: 11/18/2017 PFTs: FEV1 1.22 L or 51% predicted, FVC 1.88 L or 63% predicted, FEV1/FVC 65%, no bronchodilator response.  Lung volumes normal, diffusion capacity moderate to severely impaired. 07/07/2022 PFTs: FEV1 0.86 L or 36% predicted, FVC 1.34 L or 43% predicted, FEV1/FVC 64%, there is significant bronchodilator response with both response in FEV1 and FVC improvement.  No hyperinflation, air trapping evident.  Moderate to severe diffusion capacity impairment.  There is modest decline in lung function when compared to prior.  Consistent with asthma/COPD overlap. IMAGING: 02/16/18 CT chest wo contrast- showed almost complete resolution of spiculated nodules RML. Stable 22m right parahilar nodules since March 2019 but not present in 2017.  06/27/19 CT chest wo contrast-  previously described nodule of the right middle lobe has almost completely resolved, with residual linear scarring. There is persistent paramedian consolidation of the medial right middle lobe and lingula generally in keeping with atypical infection, particularly atypical mycobacterium. Interval increase in size of a small nodule of the right middle lobe, now measuring 4 mm, previously 2 mm Unchanged tiny biapical pulmonary nodules, benign sequelae of nonspecific infection or inflammation, possibly related cigarette smoking, atypical infection, or other inhalational, inflammatory lung disease. 12/27/19 CT Chest wo contrast- stable sub-centimeter bilateral pulmonary nodules consistent with benign etiology. Stable paramedian consolidation RML and lingula. New airspace disease inferior RML with a few adjacent subcenterimeter  nodules. Overall, consistent with waxing and waning atypical  infectious process such as MAI 07/24/2020 chest LDCT: Lung RADS 2, benign appearance or behavior.  Moderate centrilobular emphysema, multiple nonsolid and solid tiny nodules largest of which was 4.2 mm 12/02/2021 LDCT chest: New right middle lobe pulmonary nodule with aggressive appearance categorized as lung RADS 4 BS, suspicious.  Mild diffuse bronchial wall thickening and mild centrilobular and paraseptal emphysema imaging findings suggestive of underlying COPD 12/22/2021 PET/CT: The previously noted right middle pulmonary nodule appears to be resolving, no FDG avidity noted.  Consistent with infectious/inflammatory etiology.  Recommend continued radiographic follow-up to resolution.  Hypermetabolic activity in the intercostal muscles and crura of the diaphragm reflecting physiologic activity related to ventilation 03/05/2022 LDCT chest: Lung RADS 3, probably benign findings, new 5.9 mm right lower lobe nodule, short-term follow-up 6 months recommended.  Continued decrease in the size of the right middle lobe nodule.   Review of Systems A 10 point review of systems was performed and it is as noted above otherwise negative.  Patient Active Problem List   Diagnosis Date Noted   Anemia in chronic kidney disease (CKD) 03/30/2022   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   Preseptal cellulitis of right eye 08/29/2021   Leukopenia 11/22/2020   Herpes zoster without complication    Left leg pain 11/17/2020   Diarrhea 09/01/2020   Clostridium difficile diarrhea 08/26/2020   Lupus ([place]) 08/11/2020   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[place]) 08/11/2020   Chronic viral hepatitis B without delta agent and without coma ([place]) 08/11/2020   Papular rash 08/05/2020   Bacteremia due to Escherichia coli 07/17/2020   UTI (urinary tract infection) 07/16/2020   Hypothyroidism    Fever    Encounter for antineoplastic immunotherapy 07/15/2020   Goals of care, counseling/discussion 06/19/2020   Abnormal  CT of the chest 06/04/2020   Oral candidiasis 06/04/2020   Former smoker 06/04/2020   Pulmonary nodules 12/28/2019   Stage 3a chronic kidney disease (CKD) ([place]) 05/15/2019   Postop check 04/24/2019   Paronychia of great toe of right foot 04/17/2019   Ingrowing nail 04/17/2019   Medication monitoring encounter 03/25/2018   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 coli 02/18/2018   Aortic atherosclerosis ([place]) 12/13/2017   Shortness of breath 12/13/2017   Nodule of middle lobe of right lung 11/23/2017   Renal insufficiency 11/17/2017   Autoimmune hemolytic anemia ([place]) 11/03/2017   Elevated liver function tests 11/03/2017   Elevated uric acid in blood 11/03/2017   Low serum cortisol level 01/04/2017   Elevated alkaline phosphatase level 10/25/2016   Oral herpes simplex infection 10/25/2016   Current chronic use of systemic steroids 10/23/2016   Impetigo 10/23/2016   Thrombocytopenia ([place]) 10/04/2016   B12 deficiency 08/06/2016   Symptomatic anemia 07/21/2016   Atherosclerosis of native arteries of extremity with intermittent claudication ([place]) 07/20/2016   PAD (peripheral artery disease) ([place]) 07/12/2016   Post PTCA 07/12/2016   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[place]) 07/12/2016   Autoimmune hemolytic anemia, cold antibody type ([place]) 07/12/2016   Hepatitis B core antibody positive 07/12/2016   Tobacco abuse 07/12/2016   Leg pain 07/07/2016   Essential hypertension 07/07/2016   HLD (hyperlipidemia) 07/07/2016   COPD (chronic obstructive pulmonary disease) ([place]) 07/07/2016   Acute kidney injury superimposed on CKD ([place]) 07/07/2016   Heme positive stool 01/29/2015   Social History   Tobacco Use   Smoking status: Former  Packs/day: 1.25    Years: 47.00    Total pack years: 58.75    Types: Cigarettes    Quit date: 07/11/2016    Years since quitting: 6.1   Smokeless tobacco:  Never  Substance Use Topics   Alcohol use: No   Allergies  Allergen Reactions   Ciprofloxacin Swelling    Facial swelling following single oral dose.    Codeine Anaphylaxis   Nsaids Other (See Comments)    Patient has Hep C.    Current Meds  Medication Sig   albuterol (VENTOLIN HFA) 108 (90 Base) MCG/ACT inhaler SMARTSIG:2 Puff(s) By Mouth Every 4 Hours PRN   allopurinol (ZYLOPRIM) 100 MG tablet TAKE 1 TABLET BY MOUTH EVERY DAY   CALCIUM PO Take by mouth daily at 12 noon.   clopidogrel (PLAVIX) 75 MG tablet Take 1 tablet (75 mg total) by mouth daily.   cyanocobalamin 500 MCG tablet Take 500 mcg by mouth daily.   Fluticasone-Umeclidin-Vilant (TRELEGY ELLIPTA) 100-62.5-25 MCG/ACT AEPB Inhale 1 puff into the lungs daily.   hydroxychloroquine (PLAQUENIL) 200 MG tablet Take 200 mg by mouth 2 (two) times daily.   levothyroxine (SYNTHROID, LEVOTHROID) 75 MCG tablet Take 75 mcg by mouth daily before breakfast.    lisinopril-hydrochlorothiazide (ZESTORETIC) 20-25 MG tablet Take 1 tablet by mouth daily.   pantoprazole (PROTONIX) 40 MG tablet Take 1 tablet by mouth once daily   predniSONE (DELTASONE) 1 MG tablet Take 5 tablets (5 mg total) by mouth daily with breakfast.   rosuvastatin (CRESTOR) 40 MG tablet Take 1 tablet (40 mg total) by mouth daily.   VITAMIN D PO Take by mouth daily in the afternoon.       Objective:   Physical Exam BP 120/70 (BP Location: Left Arm, Cuff Size: Normal)   Pulse 75   Temp 97.8 F (36.6 C)   Ht '5\' 1"'$  (1.549 m)   Wt 188 lb 9.6 oz (85.5 kg)   SpO2 98%   BMI 35.64 kg/m   SpO2: 98 % O2 Device: None (Room air)  GENERAL: Overweight woman, no acute distress, fully ambulatory.  No conversational dyspnea. HEAD: Normocephalic, atraumatic.  EYES: Pupils equal, round, reactive to light.  No scleral icterus.  MOUTH: Poor dentition, oral mucosa moist.  No thrush. NECK: Supple. No thyromegaly. Trachea midline. No JVD.  No adenopathy. PULMONARY: Good air entry  bilaterally.  Faint end expiratory wheezes noted. CARDIOVASCULAR: S1 and S2. Regular rate and rhythm.  No rubs, murmurs or gallops heard. ABDOMEN: Obese, otherwise benign. MUSCULOSKELETAL: No joint deformity, no clubbing, no edema.  NEUROLOGIC: No overt focal deficit, no gait disturbance, speech is fluent. SKIN: Intact,warm,dry.  Multiple petechiae noted.   PSYCH: Mood and behavior normal.  Ambulatory oximetry was performed today: At rest heart rate was 81 bpm, O2 sats 99%, at maximum exercise heart rate 97 bpm and oxygen saturation nadir 94%.  Patient was able to ambulate 750 feet with minimal to no dyspnea.  Moderate pace.  Assessment & Plan:     ICD-10-CM   1. Asthma-COPD overlap syndrome  J44.89    Cannot afford Trelegy Will give a trial of Breztri 2 puffs twice a day Enroll in assistance program as needed    2. Moderate persistent asthma without complication  E99.37    Patient was unable to perform FeNO today Breztri as above    3. Pulmonary nodules  R91.8    No significant change Enrolled in lung cancer screening program    4. Shortness of breath  R06.02  ECHOCARDIOGRAM COMPLETE   Multifactorial: Poorly compensated asthma/COPD Obesity, deconditioning Echocardiogram to rule out cardiac causes    5. Undifferentiated connective tissue disease (HCC)  M35.9    Issue adds complexity to her management Higher incidence of PAH and these patients Echocardiogram as noted above     Orders Placed This Encounter  Procedures   ECHOCARDIOGRAM COMPLETE    Standing Status:   Future    Standing Expiration Date:   09/15/2023    Order Specific Question:   Where should this test be performed    Answer:   [facility]    Order Specific Question:   Perflutren DEFINITY (image enhancing agent) should be administered unless hypersensitivity or allergy exist    Answer:   Administer Perflutren    Order Specific Question:   Reason for exam-Echo    Answer:   Dyspnea  R06.00   Meds  ordered this encounter  Medications   Budeson-Glycopyrrol-Formoterol (BREZTRI AEROSPHERE) 160-9-4.8 MCG/ACT AERO    Sig: Inhale 2 puffs into the lungs in the morning and at bedtime.    Dispense:  11.8 g    Refill:  0    Order Specific Question:   Lot Number?    Answer:   [id number]    Order Specific Question:   Expiration Date?    Answer:   01/29/2025    Order Specific Question:   Manufacturer?    Answer:   [employer] [[id number]]    Order Specific Question:   Quantity    Answer:   2   See the patient in follow-up in 3 months time she is to contact us prior to that time should any new difficulties arise.  Renold Don, MD Advanced Bronchoscopy PCC[facility]-    *This note was dictated using voice recognition software/Dragon.  Despite best efforts to proofread, errors can occur which can change the meaning. Any transcriptional errors that result from this process are unintentional and may not be fully corrected at the time of dictation.

## 2022-09-14 NOTE — Patient Instructions (Addendum)
Trial of an inhaler called Breztri.  This is 2 puffs twice a day.  Please let us know how you do with this inhaler so we can call in a prescription for you.  Your oxygen level did well during her walk today.  Are going to check an echocardiogram to make sure that your heart is not the issue for your shortness of breath.  We will see you in follow-up in 3 months time call sooner should any new problems arise.

## 2022-09-15 ENCOUNTER — Encounter: Payer: Self-pay | Admitting: Pulmonary Disease

## 2022-09-16 ENCOUNTER — Other Ambulatory Visit: Payer: Self-pay | Admitting: Oncology

## 2022-09-16 DIAGNOSIS — Z1231 Encounter for screening mammogram for malignant neoplasm of breast: Secondary | ICD-10-CM

## 2022-09-18 ENCOUNTER — Telehealth: Payer: Self-pay | Admitting: Acute Care

## 2022-09-18 DIAGNOSIS — R911 Solitary pulmonary nodule: Secondary | ICD-10-CM

## 2022-09-18 DIAGNOSIS — Z87891 Personal history of nicotine dependence: Secondary | ICD-10-CM

## 2022-09-18 NOTE — Telephone Encounter (Signed)
CT results faxed to PCP with follow plans included. Order placed for 3 mth nodule f/u [facility] CT.

## 2022-09-18 NOTE — Telephone Encounter (Signed)
I have called the patient with the results of her low dose CT Chest. This was a 6 month follow up to a LT 3 done in July 2024 . The scan done 09/07/2022 showed that the previously noted 5.9 mm medial basilar right lower lobe nodule is decreased and largely indistinct on today's scan, favor decreased focal atelectasis. There is mild interval growth of an irregular small solid anterior right lower lobe pulmonary nodule measuring 5.2 mm in volume derived mean diameter (series 4/image 158), previously 2.8 mm. As there has is a new nodule with interval growth, scan was read as a 4A, and patient is in agreement with a 3 month follow up scan.  We discussed the notation of Three-vessel coronary atherosclerosis and Aortic Atherosclerosis . She is on statin therapy and she has an echo scheduled by cardiology.  Tina Page, please place order for 3 month follow up low dose CT Chest, and fax results to PCP letting them know plan is a 3 month follow up. . Thanks so much

## 2022-09-23 ENCOUNTER — Inpatient Hospital Stay: Payer: BC Managed Care – PPO | Attending: Oncology

## 2022-09-23 ENCOUNTER — Encounter: Payer: Self-pay | Admitting: Oncology

## 2022-09-23 ENCOUNTER — Inpatient Hospital Stay: Payer: BC Managed Care – PPO

## 2022-09-23 ENCOUNTER — Telehealth: Payer: Self-pay | Admitting: Pulmonary Disease

## 2022-09-23 ENCOUNTER — Inpatient Hospital Stay: Payer: BC Managed Care – PPO | Admitting: Oncology

## 2022-09-23 VITALS — BP 134/59 | HR 80 | Temp 97.2°F | Resp 18 | Wt 191.4 lb

## 2022-09-23 DIAGNOSIS — R768 Other specified abnormal immunological findings in serum: Secondary | ICD-10-CM | POA: Insufficient documentation

## 2022-09-23 DIAGNOSIS — D591 Autoimmune hemolytic anemia, unspecified: Secondary | ICD-10-CM | POA: Diagnosis not present

## 2022-09-23 DIAGNOSIS — Z833 Family history of diabetes mellitus: Secondary | ICD-10-CM | POA: Diagnosis not present

## 2022-09-23 DIAGNOSIS — Z872 Personal history of diseases of the skin and subcutaneous tissue: Secondary | ICD-10-CM

## 2022-09-23 DIAGNOSIS — N1832 Chronic kidney disease, stage 3b: Secondary | ICD-10-CM

## 2022-09-23 DIAGNOSIS — Z87891 Personal history of nicotine dependence: Secondary | ICD-10-CM | POA: Diagnosis not present

## 2022-09-23 DIAGNOSIS — D631 Anemia in chronic kidney disease: Secondary | ICD-10-CM | POA: Diagnosis present

## 2022-09-23 DIAGNOSIS — Z8249 Family history of ischemic heart disease and other diseases of the circulatory system: Secondary | ICD-10-CM | POA: Diagnosis not present

## 2022-09-23 DIAGNOSIS — Z7952 Long term (current) use of systemic steroids: Secondary | ICD-10-CM | POA: Insufficient documentation

## 2022-09-23 DIAGNOSIS — N1831 Chronic kidney disease, stage 3a: Secondary | ICD-10-CM | POA: Diagnosis not present

## 2022-09-23 DIAGNOSIS — D5911 Warm autoimmune hemolytic anemia: Secondary | ICD-10-CM | POA: Diagnosis present

## 2022-09-23 LAB — COMPREHENSIVE METABOLIC PANEL
ALT: 11 U/L (ref 0–44)
AST: 18 U/L (ref 15–41)
Albumin: 4 g/dL (ref 3.5–5.0)
Alkaline Phosphatase: 46 U/L (ref 38–126)
Anion gap: 10 (ref 5–15)
BUN: 31 mg/dL — ABNORMAL HIGH (ref 8–23)
CO2: 29 mmol/L (ref 22–32)
Calcium: 9.3 mg/dL (ref 8.9–10.3)
Chloride: 103 mmol/L (ref 98–111)
Creatinine, Ser: 1.2 mg/dL — ABNORMAL HIGH (ref 0.44–1.00)
GFR, Estimated: 51 mL/min — ABNORMAL LOW (ref >60)
Glucose, Bld: 93 mg/dL (ref 70–99)
Potassium: 3.6 mmol/L (ref 3.5–5.1)
Sodium: 142 mmol/L (ref 135–145)
Total Bilirubin: 0.8 mg/dL (ref 0.3–1.2)
Total Protein: 7 g/dL (ref 6.5–8.1)

## 2022-09-23 LAB — CBC WITH DIFFERENTIAL/PLATELET
Abs Immature Granulocytes: 0.02 10*3/uL (ref 0.00–0.07)
Basophils Absolute: 0.1 10*3/uL (ref 0.0–0.1)
Basophils Relative: 1 %
Eosinophils Absolute: 0.1 10*3/uL (ref 0.0–0.5)
Eosinophils Relative: 2 %
HCT: 30.8 % — ABNORMAL LOW (ref 36.0–46.0)
Hemoglobin: 9.3 g/dL — ABNORMAL LOW (ref 12.0–15.0)
Immature Granulocytes: 0 %
Lymphocytes Relative: 13 %
Lymphs Abs: 0.8 10*3/uL (ref 0.7–4.0)
MCH: 31.6 pg (ref 26.0–34.0)
MCHC: 30.2 g/dL (ref 30.0–36.0)
MCV: 104.8 fL — ABNORMAL HIGH (ref 80.0–100.0)
Monocytes Absolute: 0.4 10*3/uL (ref 0.1–1.0)
Monocytes Relative: 6 %
Neutro Abs: 4.6 10*3/uL (ref 1.7–7.7)
Neutrophils Relative %: 78 %
Platelets: 149 10*3/uL — ABNORMAL LOW (ref 150–400)
RBC: 2.94 MIL/uL — ABNORMAL LOW (ref 3.87–5.11)
RDW: 15.9 % — ABNORMAL HIGH (ref 11.5–15.5)
WBC: 5.9 10*3/uL (ref 4.0–10.5)
nRBC: 0 % (ref 0.0–0.2)

## 2022-09-23 LAB — SAMPLE TO BLOOD BANK

## 2022-09-23 MED ORDER — BREZTRI AEROSPHERE 160-9-4.8 MCG/ACT IN AERO: 2.0000 | INHALATION_SPRAY | Freq: Two times a day (BID) | RESPIRATORY_TRACT | 11 refills | Status: DC

## 2022-09-23 MED ORDER — EPOETIN ALFA 40000 UNIT/ML IJ SOLN
40000.0000 [IU] | Freq: Once | INTRAMUSCULAR | Status: AC
  Administered 2022-09-23: 40000 [IU] via SUBCUTANEOUS
  Filled 2022-09-23: qty 1

## 2022-09-23 MED ORDER — PREDNISONE 5 MG PO TABS: 5.0000 mg | ORAL_TABLET | Freq: Every day | ORAL | 1 refills | Status: DC

## 2022-09-23 NOTE — Assessment & Plan Note (Signed)
Encourage oral hydration. Avoid nephrotoxins. 

## 2022-09-23 NOTE — Addendum Note (Signed)
Addended by: Claudette Head A on: 09/23/2022 09:29 AM   Modules accepted: Orders

## 2022-09-23 NOTE — Assessment & Plan Note (Signed)
40,000 units Epogen monthly if Hb<10

## 2022-09-23 NOTE — Telephone Encounter (Signed)
Spoke to patient. She is requesting Rx for [facility], as she felt sample was effective. Rx sent to preferred pharmacy.  Patient is aware and voiced her understanding.  Nothing further needed.

## 2022-09-23 NOTE — Progress Notes (Signed)
Hematology/Oncology Progress note [phone] [phone]      Clinic Day:  09/23/2022  ASSESSMENT & PLAN:   Autoimmune hemolytic anemia (HCC) LDH is normal, adequate B12 and folate level, normal iron panel.  There is increased reticulocyte %-5%.  Cold agglutinin titer negative. Bilirubin is normal. 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  12/22/21 Negative PET scan  05/25/22 Bone marrow biopsy did not reveal any lymphoproliferative diseases. Mild hypocellular marrow, erythroid hyperplasia, mild evidence of spherocytosis, consistent with hemolysis.  Labs are reviewed and discussed with patient. She agrees with reserving Ritxumab for later in hemoglobin is not controlled with alternative treatments. [+hepatitis, will need entecavir if Rituximab is initiated] Continue prednisone 5 mg daily.  IVIG '1mg'$ /kg x 2  Retacrit 40,000 units every 4 week.     Anemia in chronic kidney disease (CKD) 40,000 units Epogen monthly if Hb<10  Stage 3a chronic kidney disease (CKD) (HCC) Encourage oral hydration.  Avoid nephrotoxins.    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    No orders of the defined types were placed in this encounter.  Follow up  Lab in 4 weeks + Epogen  8 weeks, lab MD + Epogen + IVIG   All questions were answered. The patient knows to call the clinic with any problems, questions or concerns.  Earlie Server, MD, PhD [facility] Hematology Oncology 09/23/2022   Chief Complaint: Tina Page is a 67 y.o. female with discoid lupus, recurrent warm autoimmune hemolytic anemia, and renal insufficiency   PERTINENT HEMATOLOGY HISTORY Patient previously followed up by Dr.Corcoran, patient switched care to me on 02/15/21 Extensive medical record review was performed by me   Autoimmune  hemolytic anemia diagnosis in 2017 her work-up revealed a cold autoantibody (IgG and complement).  Reticulocyte count was 11.3%.  Ferritin was 562.  Iron studies revealed a saturation of 20% and a TIBC of 214 (low).  B12 was 231 (low normal).  Folate was 42.   Peripheral smear revealed rouleaux formation.    Additional testing included the following + studies:  hepatitis C antibody, hepatitis B core antibody, CMV IgM, and EBV VCA (IgM and IgG).  Hepatitis B by PCR was negative.  Hepatitis C RNA was negative.  Mycoplasma pneumonia IgM was negative.  Reticulocyte count was 11.3% (high) indicating appropriate marrow response.  C3 and C4 were normal.  Cold agglutinin titer was negative x 2.  Negative studies included:  ANA, hepatitis B surface antigen, SPEP, and free light chain ratio.   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  MMA was initially 330 (normal).  Repeat MMA was elevated on 08/01/2016 confirming B12 deficiency.  Hepatitis B surface antibody was positive and hepatitis B surface antigen was negative on 08/06/2016.   07/07/2016  Chest, abdomen, and pelvis CT angiogram on revealed moderate diffuse atherosclerotic vascular disease of the abdominal aorta with severe stenosis of the left common iliac artery with suspected short segment occlusion. There was no adenopathy.  Spleen was normal.   04/15/2017 Abd US revealed a normal spleen and sludge in the gallbladder.  The liver was echogenic consistent fatty infiltration and/or hepatocellular disease.  For hemolytic anemia treatments, in 2017, She has received multiple units of  warmed PRBCs to date.  08/01/2016-03/12/2017, steroid treatment with prednisone.  She is also on folic acid 1 mg daily. Borderline low vitamin  B12 level on 07/09/2016.  Patient has been on oral vitamin B12 supplementation.  10/12/2017  positive warm autoantibody.  LDH and bilirubin were normal.   She began prednisone 10 mg on 12/07/2017  (discontinued 03/2018).  She received Rituxan weekly x 4 (last dose 06/16/2018).  Entecavir was discontinued on 07/06/2019.  06/12/2020 developed recurrent anemia.  Hematocrit was 27.6, hemoglobin 8.4, MCV 104.5, platelets 138,000, WBC 3,400 (ANC 2,300). Creatinine was 1.17 (CrCl 50 ml/min).  Positive warm autoantibody  Reticulocyte count was 7.1%.  LDH was 204 (98-192).  06/16/2020, began prednisone 1 mg/kilogram treatments, 07/24/2020, prednisone was tapered down to 20 mg.  She received Rituxan 1000 mg on day 1 and 15 (07/15/2020 and 07/29/2020).  She began entecavir on 06/28/2020.  Prednisone was tapered off.  She was on Septra for PCP prophylaxis which is now currently on hold    Other medical problems 07/10/2016.  She underwent PTCA and stent placement in right and left common iliac arteries and left external iliac artery. She is on Plavix.   03/19/2015 EGD revealed gastritis in the body and antrum.  Colonoscopy revealed one hyperplastic polyp.  Repeat EGD on 07/08/2016 was normal.  No evidence of bleeding.  History of she developed peri-oral and intranasal herpes simplex-1.  She was treated with valacyclovir and doxycycline on 10/19/2016.   10/26/2017. She developed flu like symptoms.  Symptoms included cough, myalgias, and fever (tmax unknown).  She was prescribed Mucinex, Tamiflu, and amoxicillin.  She only took amoxicillin x 5 days.  She developed increased liver function tests on 11/02/2017. CMV IgG was positive.  EBV VCA IgG, NA IgG, early antigen antibody IgG were elevated.  EBV VCA IgM was < 36.  Testing was c/w a convalescence/past infection or reactivated infection.  LFTs normalized on 11/15/2017.  Smooth muscle antibody was 39 (high) on 11/10/2017 and 34 (high) on 11/30/2017.     Chest CT on 11/16/2017 revealed a 2.2 x 2.0 x 2.4 spiculated RIGHT middle lobe mass.  There was tiny nonspecific upper lobe pulmonary nodules greater on RIGHT, largest 3 mm, of uncertain etiology.  There was  additional 8 x 8 mm opacity in the posterior sulcus of the RIGHT lower lobe. PET scan on 11/22/2017 revealed a 2 cm hypermetabolic right middle lobe lung mass (SUV 3.4) consistent  with primary lung neoplasm.  There were no findings for mediastinal/hilar lymphadenopathy or metastatic disease.  There were areas of hypermetabolism involving the left oblique abdominal muscles and the anorectal junction. PFTs on 11/18/2017 revealed an FEV1 of 1.22 liters (51%).  DLCO adj was 6.8 mL/mmHg/min (32%).   ENB done on 12/07/2017. Cytology was negative for malignancy. Pathology demonstrated organizing pneumonia and chronic bronchitis. Pathologist commented that organizing pneumonia spanned about 2 mm in one fragment. Cases of focal organizing pneumonia with hypermetabolism on FDG-PET have been described, but changes of this type can also be adjacent to a neoplasm. Patient prescribed a daily dose of Prednisone 10 mg   07/24/2020  repeat  chest CT on  revealed Lung-RADS 2, benign appearance or behavior. There was emphysema, aortic atherosclerosis, and coronary artery calcifications.  diarrhea.  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  She received azithromycin x 3 days.  She received ciprofloxacin x 10 days without improvement.  She has seen ID in [place].  She began Bactrim on 03/04/2018 (discontinued on 03/11/2018) secondary to increase in creatine.   Chronic kidney disease Urinalysis on 11/15/2017 revealed revealed no hemoglobin, bilirubin or active sediment.  Liver function tests increased on 02/15/2019.  Work-up on 02/16/2019 revealed hepatitis B E antibody positive.  Negative studies included: hepatitis A total antibody, hepatitis A IgM, hepatitis B surface antigen, hepatitis B E antigen.  Hepatitis B DNA was not detected.  Folate was 80.5 and vitamin B12 was 443.  She has received her vaccinations:  She has completed the PCV13, PPSV23, and HiB.  She received Menvio  (quadrivalent meningoccal vaccine) and Bexero (univalent serogroup B meningoccal vaccine) on 07/06/2019.  She was diagnosed with C difficile + diarrhea on 08/12/2020.  She completed a course of oral vancomycin.  Stool was negative for C. difficile on 08/27/2020.  11/17/2020 - 11/20/2020 [facility] admission with left L5 varicella zoster.  Lesion was positive for VZV. She was treated with ceftazidime and a valacyclovir.  She was discharged on 9 days of Valtrex 1 gm 3 times daily followed by 500 mg daily indefinitely for suppression and doxycycline.  History of discoid lupus  #Positive hepatitis B core antibody Previously on entecavir 0.5 mg daily, currently she has finished a course. Dose adjusted based on renal function. CrCl 30-49 ml/min   50% dosing (0.5 mg QOD) CrCl >= 50 ml/min 100% dosing (0.5 mg a day). With her kidney function currently QOD She has completed course of Entecavir and stopped 08/14/2021  # PCP prophylaxis Patient was on Septra DS on Mondays, Wednesdays, and Fridays, which was held due to fluctuated kidney functions.  Currently off Septra.  12/03/2021, CT chest lung cancer screening showed new right middle lobe pulmonary nodule with an aggressive appearance characterized as lung RADS 4 VS.  12/22/2021 PET showed No abnormal FDG avidity associated with the resolving right middle lobe pulmonary nodule now with a 12 mm ground-glass opacity in the area of prior solid nodularity, most consistent with an infectious or inflammatory etiology  INTERVAL HISTORY Tina Page is a 67 y.o. female who has above history reviewed by me today presents for follow up visit  Patient remained on prednisone '5mg'$  daily. +weight gain,  Chronic fatigue unchanged. S/p IVIG, tolerated well.  Chronic intermittent diarrhea with certain food.   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   Elevated liver function tests 11/03/2017   GERD (gastroesophageal reflux disease)    Heme positive stool 01/29/2015   Hepatitis C 07/12/2016   Ab positive, RNA negative   HLD (hyperlipidemia)    Hypertension    Hypothyroidism    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/23/2017   Post PTCA 07/12/2016   iliac stents bilaterally 2017   Thrombocytopenia ([place]) 10/04/2016   Wears dentures    full upper and lower    Past Surgical History:  Procedure Laterality Date   ELECTROMAGNETIC NAVIGATION BROCHOSCOPY N/A 12/07/2017   Procedure: ELECTROMAGNETIC NAVIGATION BRONCHOSCOPY;  Surgeon: Flora Lipps, MD;  Location: [facility] ORS;  Service: Cardiopulmonary;  Laterality: N/A;   ESOPHAGOGASTRODUODENOSCOPY (EGD) WITH PROPOFOL N/A 07/08/2016   Procedure: ESOPHAGOGASTRODUODENOSCOPY (EGD) WITH PROPOFOL;  Surgeon: Manya Silvas, MD;  Location: [facility] ENDOSCOPY;  Service: Endoscopy;  Laterality: N/A;   KNEE SURGERY Right    repair of acl tear   PERIPHERAL VASCULAR CATHETERIZATION N/A 07/10/2016   Procedure: Lower Extremity Angiography;  Surgeon: Katha Cabal, MD;  Location: [facility];  Service: Cardiovascular;  Laterality: N/A;  PERIPHERAL VASCULAR CATHETERIZATION N/A 07/10/2016   Procedure: Abdominal Aortogram w/Lower Extremity;  Surgeon: Katha Cabal, MD;  Location: [facility];  Service: Cardiovascular;  Laterality: N/A;   PERIPHERAL VASCULAR CATHETERIZATION  07/10/2016   Procedure: Lower Extremity Intervention;  Surgeon: Katha Cabal, MD;  Location: [facility];  Service: Cardiovascular;;    Family History  Problem Relation Age of Onset   Diabetes Mother    Hypertension Mother    Diabetes Maternal Grandfather    Hypertension Maternal Grandfather    Breast cancer Neg Hx     Social History:  reports that she quit smoking about 6 years ago. Her  smoking use included cigarettes. She has a 58.75 pack-year smoking history. She has never used smokeless tobacco. She reports that she does not drink alcohol and does not use drugs.  Former smoker, 58-pack-year smoking history.   She has a daughter, [name] and a daughter named [name].  She lives in [place]. The patient is alone today.  Allergies:  Allergies  Allergen Reactions   Ciprofloxacin Swelling    Facial swelling following single oral dose.    Codeine Anaphylaxis   Nsaids Other (See Comments)    Patient has Hep C.     Current Medications: Current Outpatient Medications  Medication Sig Dispense Refill   albuterol (VENTOLIN HFA) 108 (90 Base) MCG/ACT inhaler SMARTSIG:2 Puff(s) By Mouth Every 4 Hours PRN     allopurinol (ZYLOPRIM) 100 MG tablet TAKE 1 TABLET BY MOUTH EVERY DAY 30 tablet 5   Budeson-Glycopyrrol-Formoterol (BREZTRI AEROSPHERE) 160-9-4.8 MCG/ACT AERO Inhale 2 puffs into the lungs in the morning and at bedtime. 10.7 g 11   CALCIUM PO Take by mouth daily at 12 noon.     clopidogrel (PLAVIX) 75 MG tablet Take 1 tablet (75 mg total) by mouth daily. 30 tablet 5   cyanocobalamin 500 MCG tablet Take 500 mcg by mouth daily.     hydroxychloroquine (PLAQUENIL) 200 MG tablet Take 200 mg by mouth 2 (two) times daily.     levothyroxine (SYNTHROID, LEVOTHROID) 75 MCG tablet Take 75 mcg by mouth daily before breakfast.      lisinopril-hydrochlorothiazide (ZESTORETIC) 20-25 MG tablet Take 1 tablet by mouth daily. 90 tablet 3   pantoprazole (PROTONIX) 40 MG tablet Take 1 tablet by mouth once daily 30 tablet 0   predniSONE (DELTASONE) 5 MG tablet Take 1 tablet (5 mg total) by mouth daily with breakfast. 90 tablet 1   rosuvastatin (CRESTOR) 40 MG tablet Take 1 tablet (40 mg total) by mouth daily. 90 tablet 3   VITAMIN D PO Take by mouth daily in the afternoon.     folic acid (FOLVITE) 1 MG tablet TAKE 1 TABLET (1 MG TOTAL) BY MOUTH DAILY. (Patient not taking: Reported on 06/03/2022) 90  tablet 1   No current facility-administered medications for this visit.    Review of Systems  Constitutional:  Negative for appetite change, chills, fatigue and fever.  HENT:   Negative for hearing loss and voice change.   Eyes:  Negative for eye problems.  Respiratory:  Negative for chest tightness and cough.   Cardiovascular:  Negative for chest pain.  Gastrointestinal:  Negative for abdominal distention, abdominal pain and blood in stool.  Endocrine: Negative for hot flashes.  Genitourinary:  Negative for difficulty urinating and frequency.   Musculoskeletal:  Negative for arthralgias.  Skin:  Negative for itching and rash.  Neurological:  Negative for extremity weakness.  Hematological:  Negative for adenopathy.  Psychiatric/Behavioral:  Negative for confusion. The patient is not nervous/anxious.      Performance status (ECOG): 1  Vital Signs Blood pressure (!) 134/59, pulse 80, temperature (!) 97.2 F (36.2 C), resp. rate 18, weight 191 lb 6.4 oz (86.8 kg).  Physical Exam Constitutional:      General: She is not in acute distress.    Appearance: She is obese. She is not diaphoretic.  HENT:     Head: Normocephalic and atraumatic.     Nose: Nose normal.     Mouth/Throat:     Pharynx: No oropharyngeal exudate.  Eyes:     General: No scleral icterus.    Pupils: Pupils are equal, round, and reactive to light.  Cardiovascular:     Rate and Rhythm: Normal rate and regular rhythm.     Heart sounds: No murmur heard. Pulmonary:     Effort: Pulmonary effort is normal. No respiratory distress.  Abdominal:     General: There is no distension.     Palpations: Abdomen is soft.     Tenderness: There is no abdominal tenderness.  Musculoskeletal:        General: Normal range of motion.     Cervical back: Normal range of motion and neck supple.  Skin:    General: Skin is warm and dry.     Findings: No erythema.  Neurological:     Mental Status: She is alert and oriented to  person, place, and time. Mental status is at baseline.     Cranial Nerves: No cranial nerve deficit.     Motor: No abnormal muscle tone.  Psychiatric:        Mood and Affect: Mood and affect normal.      Laboratory findings     Latest Ref Rng & Units 09/23/2022   10:01 AM 08/26/2022   10:19 AM 07/29/2022    8:00 AM  CBC  WBC 4.0 - 10.5 K/uL 5.9  6.3  6.3   Hemoglobin 12.0 - 15.0 g/dL 9.3  9.5  9.6   Hematocrit 36.0 - 46.0 % 30.8  30.8  30.7   Platelets 150 - 400 K/uL 149  149  172       Latest Ref Rng & Units 09/23/2022   10:01 AM 07/29/2022    8:00 AM 06/03/2022    9:59 AM  CMP  Glucose 70 - 99 mg/dL 93  88  79   BUN 8 - 23 mg/dL '31  27  31   '$ Creatinine 0.44 - 1.00 mg/dL 1.20  1.34  1.11   Sodium 135 - 145 mmol/L 142  143  142   Potassium 3.5 - 5.1 mmol/L 3.6  3.7  3.7   Chloride 98 - 111 mmol/L 103  105  103   CO2 22 - 32 mmol/L 29  30  33   Calcium 8.9 - 10.3 mg/dL 9.3  9.5  9.5   Total Protein 6.5 - 8.1 g/dL 7.0  7.3  6.5   Total Bilirubin 0.3 - 1.2 mg/dL 0.8  0.9  1.0   Alkaline Phos 38 - 126 U/L 46  50  39   AST 15 - 41 U/L '18  19  15   '$ ALT 0 - 44 U/L 11  13  15

## 2022-09-23 NOTE — Progress Notes (Signed)
Pt here for follow up. No new concerns voiced.   

## 2022-09-23 NOTE — Assessment & Plan Note (Signed)
ANA is chronically positive.

## 2022-09-23 NOTE — Assessment & Plan Note (Addendum)
LDH is normal, adequate B12 and folate level, normal iron panel.  There is increased reticulocyte %-5%.  Cold agglutinin titer negative. Bilirubin is normal. 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  12/22/21 Negative PET scan  05/25/22 Bone marrow biopsy did not reveal any lymphoproliferative diseases. Mild hypocellular marrow, erythroid hyperplasia, mild evidence of spherocytosis, consistent with hemolysis.  Labs are reviewed and discussed with patient. She agrees with reserving Ritxumab for later in hemoglobin is not controlled with alternative treatments. [High risk of HBV reactivation] Continue prednisone 5 mg daily.  IVIG '1mg'$ /kg x 2  Retacrit 40,000 units every 4 week.

## 2022-09-24 ENCOUNTER — Inpatient Hospital Stay: Payer: BC Managed Care – PPO

## 2022-09-24 VITALS — BP 121/71 | HR 65 | Temp 97.2°F | Resp 16

## 2022-09-24 DIAGNOSIS — D5911 Warm autoimmune hemolytic anemia: Secondary | ICD-10-CM | POA: Diagnosis not present

## 2022-09-24 DIAGNOSIS — D591 Autoimmune hemolytic anemia, unspecified: Secondary | ICD-10-CM

## 2022-09-24 MED ORDER — DIPHENHYDRAMINE HCL 25 MG PO CAPS
25.0000 mg | ORAL_CAPSULE | Freq: Four times a day (QID) | ORAL | Status: DC | PRN
  Administered 2022-09-24: 25 mg via ORAL
  Filled 2022-09-24: qty 1

## 2022-09-24 MED ORDER — IMMUNE GLOBULIN (HUMAN) 5 GM/50ML IV SOLN
65.0000 g | Freq: Once | INTRAVENOUS | Status: AC
  Administered 2022-09-24: 65 g via INTRAVENOUS
  Filled 2022-09-24: qty 400

## 2022-09-24 MED ORDER — DEXTROSE 5 % IV SOLN
INTRAVENOUS | Status: DC
  Filled 2022-09-24: qty 250

## 2022-09-24 MED ORDER — ACETAMINOPHEN 325 MG PO TABS
650.0000 mg | ORAL_TABLET | Freq: Four times a day (QID) | ORAL | Status: DC | PRN
  Administered 2022-09-24: 650 mg via ORAL
  Filled 2022-09-24: qty 2

## 2022-09-24 MED ORDER — SODIUM CHLORIDE 0.9 % IV SOLN
Freq: Once | INTRAVENOUS | Status: AC
  Filled 2022-09-24: qty 250

## 2022-09-25 LAB — HAPTOGLOBIN: Haptoglobin: <10 mg/dL — ABNORMAL LOW (ref 37–355)

## 2022-09-28 ENCOUNTER — Telehealth: Payer: Self-pay | Admitting: *Deleted

## 2022-09-28 NOTE — Telephone Encounter (Signed)
Patient brought in a disability claim form today and I had also received something via fax from [employer] earlier today asking for updated notes which I sent to them. What the patient brought was dated 21/16/24 so I called and spoke with representative Hoyle Sauer who told me that I do not need to send in the form patient brought today, that they have what they need with the fax I sent this morning

## 2022-09-29 ENCOUNTER — Other Ambulatory Visit: Payer: Self-pay | Admitting: Pulmonary Disease

## 2022-10-01 ENCOUNTER — Inpatient Hospital Stay: Payer: BC Managed Care – PPO | Attending: Oncology

## 2022-10-01 VITALS — BP 150/68 | HR 72 | Temp 97.0°F | Resp 16

## 2022-10-01 DIAGNOSIS — N1831 Chronic kidney disease, stage 3a: Secondary | ICD-10-CM | POA: Diagnosis present

## 2022-10-01 DIAGNOSIS — D5911 Warm autoimmune hemolytic anemia: Secondary | ICD-10-CM | POA: Diagnosis present

## 2022-10-01 DIAGNOSIS — D591 Autoimmune hemolytic anemia, unspecified: Secondary | ICD-10-CM

## 2022-10-01 DIAGNOSIS — D631 Anemia in chronic kidney disease: Secondary | ICD-10-CM | POA: Diagnosis present

## 2022-10-01 DIAGNOSIS — Z833 Family history of diabetes mellitus: Secondary | ICD-10-CM | POA: Diagnosis not present

## 2022-10-01 DIAGNOSIS — Z87891 Personal history of nicotine dependence: Secondary | ICD-10-CM | POA: Diagnosis not present

## 2022-10-01 DIAGNOSIS — R768 Other specified abnormal immunological findings in serum: Secondary | ICD-10-CM | POA: Diagnosis not present

## 2022-10-01 DIAGNOSIS — Z8249 Family history of ischemic heart disease and other diseases of the circulatory system: Secondary | ICD-10-CM | POA: Insufficient documentation

## 2022-10-01 DIAGNOSIS — Z7952 Long term (current) use of systemic steroids: Secondary | ICD-10-CM | POA: Insufficient documentation

## 2022-10-01 MED ORDER — DIPHENHYDRAMINE HCL 25 MG PO CAPS
25.0000 mg | ORAL_CAPSULE | Freq: Four times a day (QID) | ORAL | Status: DC | PRN
  Administered 2022-10-01: 25 mg via ORAL
  Filled 2022-10-01: qty 1

## 2022-10-01 MED ORDER — ACETAMINOPHEN 325 MG PO TABS
650.0000 mg | ORAL_TABLET | Freq: Four times a day (QID) | ORAL | Status: DC | PRN
  Administered 2022-10-01: 650 mg via ORAL
  Filled 2022-10-01: qty 2

## 2022-10-01 MED ORDER — IMMUNE GLOBULIN (HUMAN) 5 GM/50ML IV SOLN
65.0000 g | Freq: Once | INTRAVENOUS | Status: AC
  Administered 2022-10-01: 65 g via INTRAVENOUS
  Filled 2022-10-01: qty 400

## 2022-10-01 MED ORDER — DEXTROSE 5 % IV SOLN
INTRAVENOUS | Status: DC
  Filled 2022-10-01: qty 250

## 2022-10-01 NOTE — Patient Instructions (Signed)

## 2022-10-06 ENCOUNTER — Telehealth: Payer: Self-pay | Admitting: *Deleted

## 2022-10-06 NOTE — Telephone Encounter (Signed)
Received yet another request for updated information on this patient to extend her disability. Form accompanying tit completed and sent for doctor signature and records printed off to send with form

## 2022-10-07 ENCOUNTER — Encounter: Payer: Self-pay | Admitting: Oncology

## 2022-10-07 NOTE — Telephone Encounter (Signed)
Form completed and faxed back to [facility]

## 2022-10-16 ENCOUNTER — Encounter: Payer: Self-pay | Admitting: Pulmonary Disease

## 2022-10-20 ENCOUNTER — Other Ambulatory Visit: Payer: Self-pay

## 2022-10-20 DIAGNOSIS — D591 Autoimmune hemolytic anemia, unspecified: Secondary | ICD-10-CM

## 2022-10-21 ENCOUNTER — Inpatient Hospital Stay: Payer: BC Managed Care – PPO

## 2022-10-21 DIAGNOSIS — D5911 Warm autoimmune hemolytic anemia: Secondary | ICD-10-CM | POA: Diagnosis not present

## 2022-10-21 DIAGNOSIS — D591 Autoimmune hemolytic anemia, unspecified: Secondary | ICD-10-CM

## 2022-10-21 LAB — CBC WITH DIFFERENTIAL (CANCER CENTER ONLY)
Abs Immature Granulocytes: 0.04 10*3/uL (ref 0.00–0.07)
Basophils Absolute: 0.1 10*3/uL (ref 0.0–0.1)
Basophils Relative: 1 %
Eosinophils Absolute: 0.1 10*3/uL (ref 0.0–0.5)
Eosinophils Relative: 2 %
HCT: 33.2 % — ABNORMAL LOW (ref 36.0–46.0)
Hemoglobin: 10.1 g/dL — ABNORMAL LOW (ref 12.0–15.0)
Immature Granulocytes: 1 %
Lymphocytes Relative: 17 %
Lymphs Abs: 1.1 10*3/uL (ref 0.7–4.0)
MCH: 30 pg (ref 26.0–34.0)
MCHC: 30.4 g/dL (ref 30.0–36.0)
MCV: 98.5 fL (ref 80.0–100.0)
Monocytes Absolute: 0.5 10*3/uL (ref 0.1–1.0)
Monocytes Relative: 7 %
Neutro Abs: 4.9 10*3/uL (ref 1.7–7.7)
Neutrophils Relative %: 72 %
Platelet Count: 158 10*3/uL (ref 150–400)
RBC: 3.37 MIL/uL — ABNORMAL LOW (ref 3.87–5.11)
RDW: 15 % (ref 11.5–15.5)
WBC Count: 6.7 10*3/uL (ref 4.0–10.5)
nRBC: 0 % (ref 0.0–0.2)

## 2022-10-21 NOTE — Progress Notes (Signed)
Hgb 10.1. epogen injection held today

## 2022-10-22 LAB — HAPTOGLOBIN: Haptoglobin: <10 mg/dL — ABNORMAL LOW (ref 37–355)

## 2022-10-23 ENCOUNTER — Ambulatory Visit: Payer: BC Managed Care – PPO | Attending: Pulmonary Disease

## 2022-10-23 DIAGNOSIS — R0602 Shortness of breath: Secondary | ICD-10-CM

## 2022-10-24 LAB — ECHOCARDIOGRAM COMPLETE
Area-P 1/2: 3.4 cm2
S' Lateral: 2.5 cm

## 2022-10-27 ENCOUNTER — Other Ambulatory Visit: Payer: Self-pay | Admitting: Pulmonary Disease

## 2022-11-03 ENCOUNTER — Ambulatory Visit
Admission: RE | Admit: 2022-11-03 | Discharge: 2022-11-03 | Disposition: A | Discharge status: Home / Self Care | Payer: BC Managed Care – PPO | Source: Ambulatory Visit | Attending: Oncology | Admitting: Oncology

## 2022-11-03 DIAGNOSIS — Z1231 Encounter for screening mammogram for malignant neoplasm of breast: Secondary | ICD-10-CM | POA: Diagnosis present

## 2022-11-09 ENCOUNTER — Ambulatory Visit
Admission: RE | Admit: 2022-11-09 | Discharge: 2022-11-09 | Disposition: A | Discharge status: Home / Self Care | Payer: BC Managed Care – PPO | Source: Ambulatory Visit | Attending: Family Medicine | Admitting: Family Medicine

## 2022-11-09 ENCOUNTER — Other Ambulatory Visit: Payer: Self-pay | Admitting: Family Medicine

## 2022-11-09 DIAGNOSIS — M545 Low back pain, unspecified: Secondary | ICD-10-CM | POA: Insufficient documentation

## 2022-11-18 ENCOUNTER — Encounter: Payer: Self-pay | Admitting: Oncology

## 2022-11-18 ENCOUNTER — Inpatient Hospital Stay (HOSPITAL_BASED_OUTPATIENT_CLINIC_OR_DEPARTMENT_OTHER): Payer: BC Managed Care – PPO | Admitting: Oncology

## 2022-11-18 ENCOUNTER — Other Ambulatory Visit: Payer: Self-pay

## 2022-11-18 ENCOUNTER — Inpatient Hospital Stay: Payer: BC Managed Care – PPO | Attending: Oncology

## 2022-11-18 ENCOUNTER — Inpatient Hospital Stay: Payer: BC Managed Care – PPO

## 2022-11-18 VITALS — BP 136/63 | HR 72 | Temp 97.6°F | Resp 18 | Wt 198.7 lb

## 2022-11-18 DIAGNOSIS — D631 Anemia in chronic kidney disease: Secondary | ICD-10-CM | POA: Insufficient documentation

## 2022-11-18 DIAGNOSIS — D591 Autoimmune hemolytic anemia, unspecified: Secondary | ICD-10-CM

## 2022-11-18 DIAGNOSIS — Z8249 Family history of ischemic heart disease and other diseases of the circulatory system: Secondary | ICD-10-CM | POA: Insufficient documentation

## 2022-11-18 DIAGNOSIS — N1831 Chronic kidney disease, stage 3a: Secondary | ICD-10-CM | POA: Insufficient documentation

## 2022-11-18 DIAGNOSIS — Z833 Family history of diabetes mellitus: Secondary | ICD-10-CM | POA: Diagnosis not present

## 2022-11-18 DIAGNOSIS — R768 Other specified abnormal immunological findings in serum: Secondary | ICD-10-CM

## 2022-11-18 DIAGNOSIS — N1832 Chronic kidney disease, stage 3b: Secondary | ICD-10-CM | POA: Diagnosis not present

## 2022-11-18 DIAGNOSIS — L930 Discoid lupus erythematosus: Secondary | ICD-10-CM | POA: Insufficient documentation

## 2022-11-18 DIAGNOSIS — Z7952 Long term (current) use of systemic steroids: Secondary | ICD-10-CM | POA: Insufficient documentation

## 2022-11-18 DIAGNOSIS — D5911 Warm autoimmune hemolytic anemia: Secondary | ICD-10-CM | POA: Insufficient documentation

## 2022-11-18 DIAGNOSIS — Z872 Personal history of diseases of the skin and subcutaneous tissue: Secondary | ICD-10-CM

## 2022-11-18 DIAGNOSIS — Z87891 Personal history of nicotine dependence: Secondary | ICD-10-CM | POA: Insufficient documentation

## 2022-11-18 LAB — CBC WITH DIFFERENTIAL (CANCER CENTER ONLY)
Abs Immature Granulocytes: 0.03 10*3/uL (ref 0.00–0.07)
Basophils Absolute: 0 10*3/uL (ref 0.0–0.1)
Basophils Relative: 1 %
Eosinophils Absolute: 0.1 10*3/uL (ref 0.0–0.5)
Eosinophils Relative: 1 %
HCT: 27.3 % — ABNORMAL LOW (ref 36.0–46.0)
Hemoglobin: 8.4 g/dL — ABNORMAL LOW (ref 12.0–15.0)
Immature Granulocytes: 1 %
Lymphocytes Relative: 12 %
Lymphs Abs: 0.7 10*3/uL (ref 0.7–4.0)
MCH: 32.1 pg (ref 26.0–34.0)
MCHC: 30.8 g/dL (ref 30.0–36.0)
MCV: 104.2 fL — ABNORMAL HIGH (ref 80.0–100.0)
Monocytes Absolute: 0.4 10*3/uL (ref 0.1–1.0)
Monocytes Relative: 6 %
Neutro Abs: 4.4 10*3/uL (ref 1.7–7.7)
Neutrophils Relative %: 79 %
Platelet Count: 138 10*3/uL — ABNORMAL LOW (ref 150–400)
RBC: 2.62 MIL/uL — ABNORMAL LOW (ref 3.87–5.11)
RDW: 18 % — ABNORMAL HIGH (ref 11.5–15.5)
WBC Count: 5.5 10*3/uL (ref 4.0–10.5)
nRBC: 0 % (ref 0.0–0.2)

## 2022-11-18 LAB — SAMPLE TO BLOOD BANK

## 2022-11-18 LAB — CMP (CANCER CENTER ONLY)
ALT: 11 U/L (ref 0–44)
AST: 19 U/L (ref 15–41)
Albumin: 4.1 g/dL (ref 3.5–5.0)
Alkaline Phosphatase: 43 U/L (ref 38–126)
Anion gap: 9 (ref 5–15)
BUN: 41 mg/dL — ABNORMAL HIGH (ref 8–23)
CO2: 28 mmol/L (ref 22–32)
Calcium: 9.3 mg/dL (ref 8.9–10.3)
Chloride: 102 mmol/L (ref 98–111)
Creatinine: 1.69 mg/dL — ABNORMAL HIGH (ref 0.44–1.00)
GFR, Estimated: 34 mL/min — ABNORMAL LOW (ref >60)
Glucose, Bld: 90 mg/dL (ref 70–99)
Potassium: 4 mmol/L (ref 3.5–5.1)
Sodium: 139 mmol/L (ref 135–145)
Total Bilirubin: 0.9 mg/dL (ref 0.3–1.2)
Total Protein: 7.1 g/dL (ref 6.5–8.1)

## 2022-11-18 LAB — HEPATITIS B SURFACE ANTIGEN: Hepatitis B Surface Ag: NONREACTIVE

## 2022-11-18 LAB — HEPATITIS B CORE ANTIBODY, IGM: Hep B C IgM: NONREACTIVE

## 2022-11-18 MED ORDER — EPOETIN ALFA 40000 UNIT/ML IJ SOLN
40000.0000 [IU] | Freq: Once | INTRAMUSCULAR | Status: AC
  Administered 2022-11-18: 40000 [IU] via SUBCUTANEOUS
  Filled 2022-11-18: qty 1

## 2022-11-18 NOTE — Assessment & Plan Note (Signed)
ANA is chronically positive. 

## 2022-11-18 NOTE — Assessment & Plan Note (Addendum)
LDH is normal, adequate B12 and folate level, normal iron panel.  There is increased reticulocyte %-5%.  Cold agglutinin titer negative. Bilirubin is normal. 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  12/22/21 Negative PET scan  05/25/22 Bone marrow biopsy did not reveal any lymphoproliferative diseases. Mild hypocellular marrow, erythroid hyperplasia, mild evidence of spherocytosis, consistent with hemolysis.  Labs are reviewed and discussed with patient. Hemoglobin improved in February to 10.1 with normal MCV.  Indicating temporarily hemolysis was improved due to IVIG however duration of effectiveness is very short.  Today hemoglobin dropped again to 8.6. Continue prednisone 5 mg daily.  IVIG 1mg /kg x 2, Epogan today 40,000 units  I had a lengthy discussion with the patient and discuss about other options including azathioprine, cyclosporine etc.  These options may also impair immunity and reactivate Hepatitis.  We discussed the option of continue prednisone/IVIG/erythropoietin replacement therapy versus rechallenge with rituximab with long-term antiviral.  Will need ID input.  Patient elected to rituximab treatments.  She will call ID to restart hepatitis B viral suppression.  She will receive prednisone/IVIG/erythropoietin therapy bridging to the start of rituximab.

## 2022-11-18 NOTE — Assessment & Plan Note (Addendum)
40,000 units monthly Epogen monthly if Hb<10

## 2022-11-18 NOTE — Assessment & Plan Note (Signed)
history of hepatitis B previously on entecavir after her previous rituximab treatments.  Repeat hepatitis B antigen and core IgM.  Patient will follow-up with ID to restart Entecavir

## 2022-11-18 NOTE — Progress Notes (Signed)
Hematology/Oncology Progress note Telephone:([phone] [phone]      Clinic Day:  11/18/2022  ASSESSMENT & PLAN:   Autoimmune hemolytic anemia (HCC) LDH is normal, adequate B12 and folate level, normal iron panel.  There is increased reticulocyte %-5%.  Cold agglutinin titer negative. Bilirubin is normal. 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  12/22/21 Negative PET scan  05/25/22 Bone marrow biopsy did not reveal any lymphoproliferative diseases. Mild hypocellular marrow, erythroid hyperplasia, mild evidence of spherocytosis, consistent with hemolysis.  Labs are reviewed and discussed with patient. Hemoglobin improved in February to 10.1 with normal MCV.  Indicating temporarily hemolysis was improved due to IVIG however duration of effectiveness is very short.  Today hemoglobin dropped again to 8.6. Continue prednisone 5 mg daily.  IVIG 1mg /kg x 2, Epogan today 40,000 units  I had a lengthy discussion with the patient and discuss about other options including azathioprine, cyclosporine etc.  These options may also impair immunity and reactivate Hepatitis.  We discussed the option of continue prednisone/IVIG/erythropoietin replacement therapy versus rechallenge with rituximab with long-term antiviral.  Will need ID input.  Patient elected to rituximab treatments.  She will call ID to restart hepatitis B viral suppression.  She will receive prednisone/IVIG/erythropoietin therapy bridging to the start of rituximab.    Anemia in chronic kidney disease (CKD) 40,000 units monthly Epogen monthly if Hb<10  Hepatitis B core antibody positive  history of hepatitis B previously on entecavir after her previous rituximab treatments.  Repeat hepatitis B antigen and core IgM.  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  Stage 3a chronic kidney disease (CKD) (HCC) Encourage oral hydration.  Avoid nephrotoxins.      Orders Placed This Encounter  Procedures   Hepatitis B surface antigen    Standing Status:   Future    Number of Occurrences:   1    Standing Expiration Date:   11/18/2023   Hepatitis B core antibody, IgM    Standing Status:   Future    Number of Occurrences:   1    Standing Expiration Date:   11/18/2023   CBC ([place] Only)    Standing Status:   Future    Standing Expiration Date:   12/24/2023   CMP ([place] only)    Standing Status:   Future    Standing Expiration Date:   12/24/2023   CBC ([place] Only)    Standing Status:   Future    Standing Expiration Date:   12/31/2023   CBC ([place] Only)    Standing Status:   Future    Standing Expiration Date:   01/07/2024   CBC ([place] Only)    Standing Status:   Future    Standing Expiration Date:   01/14/2024    Follow up Per LOS  All questions were answered. The patient knows to call the clinic with any problems, questions or concerns.  Earlie Server, MD, PhD [facility] Hematology Oncology 11/18/2022   Chief Complaint: Tina Page is a 67 y.o. female with discoid lupus, recurrent warm autoimmune hemolytic anemia, and renal insufficiency   PERTINENT HEMATOLOGY HISTORY Patient previously followed up by Dr.Corcoran, patient switched care to me on 02/15/21 Extensive medical record review was performed by me   Autoimmune hemolytic anemia diagnosis in 2017 her  work-up revealed a cold autoantibody (IgG and complement).  Reticulocyte count was 11.3%.  Ferritin was 562.  Iron studies revealed a saturation of 20% and a TIBC of 214 (low).  B12 was 231 (low normal).  Folate was 42.   Peripheral smear revealed rouleaux formation.    Additional testing included the following + studies:  hepatitis C antibody, hepatitis B core antibody, CMV IgM, and EBV VCA (IgM and  IgG).  Hepatitis B by PCR was negative.  Hepatitis C RNA was negative.  Mycoplasma pneumonia IgM was negative.  Reticulocyte count was 11.3% (high) indicating appropriate marrow response.  C3 and C4 were normal.  Cold agglutinin titer was negative x 2.  Negative studies included:  ANA, hepatitis B surface antigen, SPEP, and free light chain ratio.   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  MMA was initially 330 (normal).  Repeat MMA was elevated on 08/01/2016 confirming B12 deficiency.  Hepatitis B surface antibody was positive and hepatitis B surface antigen was negative on 08/06/2016.   07/07/2016  Chest, abdomen, and pelvis CT angiogram on revealed moderate diffuse atherosclerotic vascular disease of the abdominal aorta with severe stenosis of the left common iliac artery with suspected short segment occlusion. There was no adenopathy.  Spleen was normal.   04/15/2017 Abd US revealed a normal spleen and sludge in the gallbladder.  The liver was echogenic consistent fatty infiltration and/or hepatocellular disease.  For hemolytic anemia treatments, in 2017, She has received multiple units of  warmed PRBCs to date.  08/01/2016-03/12/2017, steroid treatment with prednisone.  She is also on folic acid 1 mg daily. Borderline low vitamin B12 level on 07/09/2016.  Patient has been on oral vitamin B12 supplementation.  10/12/2017  positive warm autoantibody.  LDH and bilirubin were normal.   She began prednisone 10 mg on 12/07/2017 (discontinued 03/2018).  She received Rituxan weekly x 4 (last dose 06/16/2018).  Entecavir was discontinued on 07/06/2019.  06/12/2020 developed recurrent anemia.  Hematocrit was 27.6, hemoglobin 8.4, MCV 104.5, platelets 138,000, WBC 3,400 (ANC 2,300). Creatinine was 1.17 (CrCl 50 ml/min).  Positive warm autoantibody  Reticulocyte count was 7.1%.  LDH was 204 (98-192).  06/16/2020, began prednisone 1 mg/kilogram treatments, 07/24/2020, prednisone  was tapered down to 20 mg.  She received Rituxan 1000 mg on day 1 and 15 (07/15/2020 and 07/29/2020).  She began entecavir on 06/28/2020.  Prednisone was tapered off.  She was on Septra for PCP prophylaxis which is now currently on hold    Other medical problems 07/10/2016.  She underwent PTCA and stent placement in right and left common iliac arteries and left external iliac artery. She is on Plavix.   03/19/2015 EGD revealed gastritis in the body and antrum.  Colonoscopy revealed one hyperplastic polyp.  Repeat EGD on 07/08/2016 was normal.  No evidence of bleeding.  History of she developed peri-oral and intranasal herpes simplex-1.  She was treated with valacyclovir and doxycycline on 10/19/2016.   10/26/2017. She developed flu like symptoms.  Symptoms included cough, myalgias, and fever (tmax unknown).  She was prescribed Mucinex, Tamiflu, and amoxicillin.  She only took amoxicillin x 5 days.  She developed increased liver function tests on 11/02/2017. CMV IgG was positive.  EBV VCA IgG, NA IgG, early antigen antibody IgG were elevated.  EBV VCA IgM was < 36.  Testing was c/w a convalescence/past infection or reactivated infection.  LFTs normalized on 11/15/2017.  Smooth muscle antibody was 39 (high) on 11/10/2017 and 34 (  high) on 11/30/2017.     Chest CT on 11/16/2017 revealed a 2.2 x 2.0 x 2.4 spiculated RIGHT middle lobe mass.  There was tiny nonspecific upper lobe pulmonary nodules greater on RIGHT, largest 3 mm, of uncertain etiology.  There was additional 8 x 8 mm opacity in the posterior sulcus of the RIGHT lower lobe. PET scan on 11/22/2017 revealed a 2 cm hypermetabolic right middle lobe lung mass (SUV 3.4) consistent  with primary lung neoplasm.  There were no findings for mediastinal/hilar lymphadenopathy or metastatic disease.  There were areas of hypermetabolism involving the left oblique abdominal muscles and the anorectal junction. PFTs on 11/18/2017 revealed an FEV1 of 1.22 liters  (51%).  DLCO adj was 6.8 mL/mmHg/min (32%).   ENB done on 12/07/2017. Cytology was negative for malignancy. Pathology demonstrated organizing pneumonia and chronic bronchitis. Pathologist commented that organizing pneumonia spanned about 2 mm in one fragment. Cases of focal organizing pneumonia with hypermetabolism on FDG-PET have been described, but changes of this type can also be adjacent to a neoplasm. Patient prescribed a daily dose of Prednisone 10 mg   07/24/2020  repeat  chest CT on  revealed Lung-RADS 2, benign appearance or behavior. There was emphysema, aortic atherosclerosis, and coronary artery calcifications.  diarrhea.  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  She received azithromycin x 3 days.  She received ciprofloxacin x 10 days without improvement.  She has seen ID in [place].  She began Bactrim on 03/04/2018 (discontinued on 03/11/2018) secondary to increase in creatine.   Chronic kidney disease Urinalysis on 11/15/2017 revealed revealed no hemoglobin, bilirubin or active sediment.   Liver function tests increased on 02/15/2019.  Work-up on 02/16/2019 revealed hepatitis B E antibody positive.  Negative studies included: hepatitis A total antibody, hepatitis A IgM, hepatitis B surface antigen, hepatitis B E antigen.  Hepatitis B DNA was not detected.  Folate was 80.5 and vitamin B12 was 443.  She has received her vaccinations:  She has completed the PCV13, PPSV23, and HiB.  She received Menvio (quadrivalent meningoccal vaccine) and Bexero (univalent serogroup B meningoccal vaccine) on 07/06/2019.  She was diagnosed with C difficile + diarrhea on 08/12/2020.  She completed a course of oral vancomycin.  Stool was negative for C. difficile on 08/27/2020.  11/17/2020 - 11/20/2020 [facility] admission with left L5 varicella zoster.  Lesion was positive for VZV. She was treated with ceftazidime and a valacyclovir.  She was discharged on 9 days of Valtrex 1  gm 3 times daily followed by 500 mg daily indefinitely for suppression and doxycycline.  History of discoid lupus  #Positive hepatitis B core antibody Previously on entecavir 0.5 mg daily, currently she has finished a course. Dose adjusted based on renal function. CrCl 30-49 ml/min   50% dosing (0.5 mg QOD) CrCl >= 50 ml/min 100% dosing (0.5 mg a day). With her kidney function currently QOD She has completed course of Entecavir and stopped 08/14/2021  # PCP prophylaxis Patient was on Septra DS on Mondays, Wednesdays, and Fridays, which was held due to fluctuated kidney functions.  Currently off Septra.  12/03/2021, CT chest lung cancer screening showed new right middle lobe pulmonary nodule with an aggressive appearance characterized as lung RADS 4 VS.  12/22/2021 PET showed No abnormal FDG avidity associated with the resolving right middle lobe pulmonary nodule now with a 12 mm ground-glass opacity in the area of prior solid nodularity, most consistent with an infectious or inflammatory etiology  INTERVAL HISTORY  KEHINDE OBARA is a 67 y.o. female who has above history reviewed by me today presents for follow up visit  Patient remained on prednisone 5mg  daily.  Patient continues to have weight gain Chronic fatigue unchanged. S/p IVIG, tolerated well.  + Back pain, had back x-ray obtained by primary care provider.  X-ray showed degenerative changes.  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   Elevated liver function tests 11/03/2017   GERD (gastroesophageal reflux disease)    Heme positive stool 01/29/2015   Hepatitis C 07/12/2016   Ab positive, RNA negative   HLD (hyperlipidemia)    Hypertension    Hypothyroidism    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  11/23/2017   Post PTCA 07/12/2016   iliac stents bilaterally 2017   Thrombocytopenia ([place]) 10/04/2016   Wears dentures    full upper and lower    Past Surgical History:  Procedure Laterality Date   ELECTROMAGNETIC NAVIGATION BROCHOSCOPY N/A 12/07/2017   Procedure: ELECTROMAGNETIC NAVIGATION BRONCHOSCOPY;  Surgeon: Flora Lipps, MD;  Location: [facility] ORS;  Service: Cardiopulmonary;  Laterality: N/A;   ESOPHAGOGASTRODUODENOSCOPY (EGD) WITH PROPOFOL N/A 07/08/2016   Procedure: ESOPHAGOGASTRODUODENOSCOPY (EGD) WITH PROPOFOL;  Surgeon: Manya Silvas, MD;  Location: [facility] ENDOSCOPY;  Service: Endoscopy;  Laterality: N/A;   KNEE SURGERY Right    repair of acl tear   PERIPHERAL VASCULAR CATHETERIZATION N/A 07/10/2016   Procedure: Lower Extremity Angiography;  Surgeon: Katha Cabal, MD;  Location: [place] CV LAB;  Service: Cardiovascular;  Laterality: N/A;   PERIPHERAL VASCULAR CATHETERIZATION N/A 07/10/2016   Procedure: Abdominal Aortogram w/Lower Extremity;  Surgeon: Katha Cabal, MD;  Location: [place] [facility] LAB;  Service: Cardiovascular;  Laterality: N/A;   PERIPHERAL VASCULAR CATHETERIZATION  07/10/2016   Procedure: Lower Extremity Intervention;  Surgeon: Katha Cabal, MD;  Location: [place] [facility] LAB;  Service: Cardiovascular;;    Family History  Problem Relation Age of Onset   Diabetes Mother    Hypertension Mother    Diabetes Maternal Grandfather    Hypertension Maternal Grandfather    Breast cancer Neg Hx     Social History:  reports that she quit smoking about 6 years ago. Her smoking use included cigarettes. She has a 58.75 pack-year smoking history. She has never used smokeless tobacco. She reports that she does not drink alcohol and does not use drugs.  Former smoker, 58-pack-year smoking history.   She has a daughter, [name] and a daughter named [name].  She lives in [place]. The patient is alone today.  Allergies:  Allergies  Allergen Reactions    Ciprofloxacin Swelling    Facial swelling following single oral dose.    Codeine Anaphylaxis   Nsaids Other (See Comments)    Patient has Hep C.     Current Medications: Current Outpatient Medications  Medication Sig Dispense Refill   albuterol (VENTOLIN HFA) 108 (90 Base) MCG/ACT inhaler SMARTSIG:2 Puff(s) By Mouth Every 4 Hours PRN     allopurinol (ZYLOPRIM) 100 MG tablet TAKE 1 TABLET BY MOUTH EVERY DAY 30 tablet 5   Budeson-Glycopyrrol-Formoterol (BREZTRI AEROSPHERE) 160-9-4.8 MCG/ACT AERO Inhale 2 puffs into the lungs in the morning and at bedtime. 10.7 g 11   CALCIUM PO Take by mouth daily at 12 noon.     clopidogrel (PLAVIX)  75 MG tablet Take 1 tablet (75 mg total) by mouth daily. 30 tablet 5   cyanocobalamin 500 MCG tablet Take 500 mcg by mouth daily.     hydroxychloroquine (PLAQUENIL) 200 MG tablet Take 200 mg by mouth 2 (two) times daily.     levothyroxine (SYNTHROID, LEVOTHROID) 75 MCG tablet Take 75 mcg by mouth daily before breakfast.      lisinopril-hydrochlorothiazide (ZESTORETIC) 20-25 MG tablet Take 1 tablet by mouth daily. 90 tablet 3   pantoprazole (PROTONIX) 40 MG tablet Take 1 tablet by mouth once daily 30 tablet 0   predniSONE (DELTASONE) 5 MG tablet Take 1 tablet (5 mg total) by mouth daily with breakfast. 90 tablet 1   rosuvastatin (CRESTOR) 40 MG tablet Take 1 tablet (40 mg total) by mouth daily. 90 tablet 3   VITAMIN D PO Take by mouth daily in the afternoon.     No current facility-administered medications for this visit.    Review of Systems  Constitutional:  Negative for appetite change, chills, fatigue and fever.  HENT:   Negative for hearing loss and voice change.   Eyes:  Negative for eye problems.  Respiratory:  Negative for chest tightness and cough.   Cardiovascular:  Negative for chest pain.  Gastrointestinal:  Negative for abdominal distention, abdominal pain and blood in stool.  Endocrine: Negative for hot flashes.  Genitourinary:  Negative  for difficulty urinating and frequency.   Musculoskeletal:  Negative for arthralgias.  Skin:  Negative for itching and rash.  Neurological:  Negative for extremity weakness.  Hematological:  Negative for adenopathy.  Psychiatric/Behavioral:  Negative for confusion. The patient is not nervous/anxious.      Performance status (ECOG): 1  Vital Signs Blood pressure 136/63, pulse 72, temperature 97.6 F (36.4 C), resp. rate 18, weight 198 lb 11.2 oz (90.1 kg).  Physical Exam Constitutional:      General: She is not in acute distress.    Appearance: She is obese. She is not diaphoretic.  HENT:     Head: Normocephalic and atraumatic.     Nose: Nose normal.     Mouth/Throat:     Pharynx: No oropharyngeal exudate.  Eyes:     General: No scleral icterus.    Pupils: Pupils are equal, round, and reactive to light.  Cardiovascular:     Rate and Rhythm: Normal rate and regular rhythm.     Heart sounds: No murmur heard. Pulmonary:     Effort: Pulmonary effort is normal. No respiratory distress.  Abdominal:     General: There is no distension.     Palpations: Abdomen is soft.     Tenderness: There is no abdominal tenderness.  Musculoskeletal:        General: Normal range of motion.     Cervical back: Normal range of motion and neck supple.  Skin:    General: Skin is warm and dry.     Findings: No erythema.  Neurological:     Mental Status: She is alert and oriented to person, place, and time. Mental status is at baseline.     Cranial Nerves: No cranial nerve deficit.     Motor: No abnormal muscle tone.  Psychiatric:        Mood and Affect: Mood and affect normal.      Laboratory findings     Latest Ref Rng & Units 11/18/2022    9:55 AM 10/21/2022   10:20 AM 09/23/2022   10:01 AM  CBC  WBC 4.0 -  10.5 K/uL 5.5  6.7  5.9   Hemoglobin 12.0 - 15.0 g/dL 8.4  10.1  9.3   Hematocrit 36.0 - 46.0 % 27.3  33.2  30.8   Platelets 150 - 400 K/uL 138  158  149       Latest Ref Rng &  Units 11/18/2022    9:55 AM 09/23/2022   10:01 AM 07/29/2022    8:00 AM  CMP  Glucose 70 - 99 mg/dL 90  93  88   BUN 8 - 23 mg/dL 41  31  27   Creatinine 0.44 - 1.00 mg/dL 1.69  1.20  1.34   Sodium 135 - 145 mmol/L 139  142  143   Potassium 3.5 - 5.1 mmol/L 4.0  3.6  3.7   Chloride 98 - 111 mmol/L 102  103  105   CO2 22 - 32 mmol/L 28  29  30    Calcium 8.9 - 10.3 mg/dL 9.3  9.3  9.5   Total Protein 6.5 - 8.1 g/dL 7.1  7.0  7.3   Total Bilirubin 0.3 - 1.2 mg/dL 0.9  0.8  0.9   Alkaline Phos 38 - 126 U/L 43  46  50   AST 15 - 41 U/L 19  18  19    ALT 0 - 44 U/L 11  11  13

## 2022-11-18 NOTE — Progress Notes (Signed)
Pt here for follow up. Pt reports that she has been having back pain and had a recent xray on 3/11. She has not been contacted by PCP with results yet.

## 2022-11-18 NOTE — Assessment & Plan Note (Signed)
Encourage oral hydration. Avoid nephrotoxins. 

## 2022-11-19 ENCOUNTER — Telehealth: Payer: Self-pay | Admitting: *Deleted

## 2022-11-19 ENCOUNTER — Inpatient Hospital Stay: Payer: BC Managed Care – PPO

## 2022-11-19 VITALS — BP 117/64 | HR 75 | Temp 96.5°F | Resp 18

## 2022-11-19 DIAGNOSIS — D591 Autoimmune hemolytic anemia, unspecified: Secondary | ICD-10-CM

## 2022-11-19 DIAGNOSIS — D5911 Warm autoimmune hemolytic anemia: Secondary | ICD-10-CM | POA: Diagnosis not present

## 2022-11-19 LAB — HAPTOGLOBIN: Haptoglobin: <10 mg/dL — ABNORMAL LOW (ref 37–355)

## 2022-11-19 MED ORDER — ACETAMINOPHEN 325 MG PO TABS
650.0000 mg | ORAL_TABLET | Freq: Four times a day (QID) | ORAL | Status: DC | PRN
  Administered 2022-11-19: 650 mg via ORAL
  Filled 2022-11-19: qty 2

## 2022-11-19 MED ORDER — DEXTROSE 5 % IV SOLN
INTRAVENOUS | Status: DC
  Filled 2022-11-19: qty 250

## 2022-11-19 MED ORDER — DIPHENHYDRAMINE HCL 25 MG PO CAPS
25.0000 mg | ORAL_CAPSULE | Freq: Four times a day (QID) | ORAL | Status: DC | PRN
  Administered 2022-11-19: 25 mg via ORAL
  Filled 2022-11-19: qty 1

## 2022-11-19 MED ORDER — SODIUM CHLORIDE 0.9 % IV SOLN
INTRAVENOUS | Status: DC
  Filled 2022-11-19: qty 250

## 2022-11-19 MED ORDER — IMMUNE GLOBULIN (HUMAN) 10 GM/100ML IV SOLN
1.0000 g/kg | Freq: Once | INTRAVENOUS | Status: AC
  Administered 2022-11-19: 65 g via INTRAVENOUS
  Filled 2022-11-19: qty 650

## 2022-11-19 NOTE — Telephone Encounter (Signed)
Disability forms received from patient from [facility], form complete and sent for doctor signature

## 2022-11-19 NOTE — Telephone Encounter (Signed)
Received a message from [employer] that patient said to hold off on her form for now because she got notified that the company has more forms to be done. I retrieved the form from physician team and will await the other forms that patient said she will bring next week

## 2022-11-24 ENCOUNTER — Other Ambulatory Visit: Payer: Self-pay | Admitting: Pulmonary Disease

## 2022-11-25 ENCOUNTER — Inpatient Hospital Stay: Payer: BC Managed Care – PPO

## 2022-11-25 ENCOUNTER — Other Ambulatory Visit (INDEPENDENT_AMBULATORY_CARE_PROVIDER_SITE_OTHER): Payer: Self-pay | Admitting: Vascular Surgery

## 2022-11-25 VITALS — BP 132/52 | HR 71 | Temp 96.7°F | Resp 16 | Wt 196.4 lb

## 2022-11-25 DIAGNOSIS — D591 Autoimmune hemolytic anemia, unspecified: Secondary | ICD-10-CM

## 2022-11-25 DIAGNOSIS — Z9889 Other specified postprocedural states: Secondary | ICD-10-CM

## 2022-11-25 DIAGNOSIS — D5911 Warm autoimmune hemolytic anemia: Secondary | ICD-10-CM | POA: Diagnosis not present

## 2022-11-25 MED ORDER — DIPHENHYDRAMINE HCL 25 MG PO CAPS
25.0000 mg | ORAL_CAPSULE | Freq: Four times a day (QID) | ORAL | Status: DC | PRN
  Administered 2022-11-25: 25 mg via ORAL
  Filled 2022-11-25: qty 1

## 2022-11-25 MED ORDER — DEXTROSE 5 % IV SOLN
INTRAVENOUS | Status: DC
  Filled 2022-11-25: qty 250

## 2022-11-25 MED ORDER — IMMUNE GLOBULIN (HUMAN) 5 GM/100ML IV SOLN
65.0000 g | Freq: Once | INTRAVENOUS | Status: AC
  Administered 2022-11-25: 65 g via INTRAVENOUS
  Filled 2022-11-25: qty 400

## 2022-11-25 MED ORDER — ACETAMINOPHEN 325 MG PO TABS
650.0000 mg | ORAL_TABLET | Freq: Four times a day (QID) | ORAL | Status: DC | PRN
  Administered 2022-11-25: 650 mg via ORAL
  Filled 2022-11-25: qty 2

## 2022-12-01 ENCOUNTER — Other Ambulatory Visit: Payer: Self-pay

## 2022-12-03 ENCOUNTER — Ambulatory Visit (INDEPENDENT_AMBULATORY_CARE_PROVIDER_SITE_OTHER): Payer: BC Managed Care – PPO

## 2022-12-03 ENCOUNTER — Encounter (INDEPENDENT_AMBULATORY_CARE_PROVIDER_SITE_OTHER): Payer: Self-pay | Admitting: Nurse Practitioner

## 2022-12-03 ENCOUNTER — Ambulatory Visit (INDEPENDENT_AMBULATORY_CARE_PROVIDER_SITE_OTHER): Payer: BC Managed Care – PPO | Admitting: Nurse Practitioner

## 2022-12-03 VITALS — BP 119/61 | HR 80 | Resp 16 | Wt 196.4 lb

## 2022-12-03 DIAGNOSIS — I739 Peripheral vascular disease, unspecified: Secondary | ICD-10-CM

## 2022-12-03 DIAGNOSIS — E782 Mixed hyperlipidemia: Secondary | ICD-10-CM

## 2022-12-03 DIAGNOSIS — Z9889 Other specified postprocedural states: Secondary | ICD-10-CM

## 2022-12-03 DIAGNOSIS — I10 Essential (primary) hypertension: Secondary | ICD-10-CM | POA: Diagnosis not present

## 2022-12-03 NOTE — Progress Notes (Signed)
Subjective:    Patient ID: Tina Page, female    DOB: 1955-11-28, 67 y.o.   MRN: [id number] Chief Complaint  Patient presents with   Follow-up    Ultrasound follow up    Krystal Canjura is a 67 year old female that returns to the office for followup and review of the noninvasive studies.   There have been no interval changes in lower extremity symptoms. No interval shortening of the patient's claudication distance or development of rest pain symptoms. No new ulcers or wounds have occurred since the last visit.  There have been no significant changes to the patient's overall health care.  The patient denies amaurosis fugax or recent TIA symptoms. There are no documented recent neurological changes noted. There is no history of DVT, PE or superficial thrombophlebitis. The patient denies recent episodes of angina or shortness of breath.   ABI Rt=1.01 and Lt=1.04  (previous ABI's Rt=1.15 and Lt=1.10) Duplex ultrasound of the bilateral tibial arteries reveals triphasic waveforms with normal toe waveforms    Review of Systems  Musculoskeletal:  Positive for arthralgias and back pain.  Neurological:  Positive for numbness.  All other systems reviewed and are negative.      Objective:   Physical Exam Vitals reviewed.  HENT:     Head: Normocephalic.  Cardiovascular:     Rate and Rhythm: Normal rate.     Pulses:          Dorsalis pedis pulses are 1+ on the right side and 1+ on the left side.       Posterior tibial pulses are 1+ on the right side and 1+ on the left side.  Pulmonary:     Effort: Pulmonary effort is normal.  Skin:    General: Skin is warm and dry.  Neurological:     Mental Status: She is alert and oriented to person, place, and time.  Psychiatric:        Mood and Affect: Mood normal.        Behavior: Behavior normal.        Thought Content: Thought content normal.        Judgment: Judgment normal.     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/23/2017   Post PTCA 07/12/2016   iliac stents bilaterally 2017   Thrombocytopenia 10/04/2016   Wears dentures    full upper and lower    Social History   Socioeconomic History   Marital status: Widowed    Spouse name: Not on file   Number of children: Not on file   Years of education: Not on file   Highest education level: Not on file  Occupational History   Not on file  Tobacco Use   Smoking status: Former    Packs/day: 1.25    Years: 47.00  Additional pack years: 0.00    Total pack years: 58.75    Types: Cigarettes    Quit date: 07/11/2016    Years since quitting: 6.4   Smokeless tobacco: Never  Vaping Use   Vaping Use: Never used  Substance and Sexual Activity   Alcohol use: No   Drug use: No   Sexual activity: Not Currently    Birth control/protection: None  Other Topics Concern   Not on file  Social History Narrative   Not on file   Social Determinants of Health   Financial Resource Strain: Not on file  Food Insecurity: Not on file  Transportation Needs: Not on file  Physical Activity: Not on file  Stress: Not on file  Social Connections: Not on file  Intimate Partner Violence: Not on file    Past Surgical History:  Procedure Laterality Date   ELECTROMAGNETIC NAVIGATION BROCHOSCOPY N/A 12/07/2017   Procedure: ELECTROMAGNETIC NAVIGATION BRONCHOSCOPY;  Surgeon: Flora Lipps, MD;   Location: [facility];  Service: Cardiopulmonary;  Laterality: N/A;   ESOPHAGOGASTRODUODENOSCOPY (EGD) WITH PROPOFOL N/A 07/08/2016   Procedure: ESOPHAGOGASTRODUODENOSCOPY (EGD) WITH PROPOFOL;  Surgeon: Manya Silvas, MD;  Location: [facility];  Service: Endoscopy;  Laterality: N/A;   KNEE SURGERY Right    repair of acl tear   PERIPHERAL VASCULAR CATHETERIZATION N/A 07/10/2016   Procedure: Lower Extremity Angiography;  Surgeon: Katha Cabal, MD;  Location: [place] [facility];  Service: Cardiovascular;  Laterality: N/A;   PERIPHERAL VASCULAR CATHETERIZATION N/A 07/10/2016   Procedure: Abdominal Aortogram w/Lower Extremity;  Surgeon: Katha Cabal, MD;  Location: [place] [facility];  Service: Cardiovascular;  Laterality: N/A;   PERIPHERAL VASCULAR CATHETERIZATION  07/10/2016   Procedure: Lower Extremity Intervention;  Surgeon: Katha Cabal, MD;  Location: [place] [facility];  Service: Cardiovascular;;    Family History  Problem Relation Age of Onset   Diabetes Mother    Hypertension Mother    Diabetes Maternal Grandfather    Hypertension Maternal Grandfather    Breast cancer Neg Hx     Allergies  Allergen Reactions   Ciprofloxacin Swelling    Facial swelling following single oral dose.    Codeine Anaphylaxis   Nsaids Other (See Comments)    Patient has Hep C.        Latest Ref Rng & Units 11/18/2022    9:55 AM 10/21/2022   10:20 AM 09/23/2022   10:01 AM  CBC  WBC 4.0 - 10.5 K/uL 5.5  6.7  5.9   Hemoglobin 12.0 - 15.0 g/dL 8.4  10.1  9.3   Hematocrit 36.0 - 46.0 % 27.3  33.2  30.8   Platelets 150 - 400 K/uL 138  158  149       CMP     Component Value Date/Time   NA 139 11/18/2022 0955   NA 142 06/11/2014 1732   K 4.0 11/18/2022 0955   K 3.1 (L) 06/11/2014 1732   CL 102 11/18/2022 0955   CL 103 06/11/2014 1732   CO2 28 11/18/2022 0955   CO2 32 06/11/2014 1732   GLUCOSE 90 11/18/2022 0955   GLUCOSE 100 (H) 06/11/2014 1732   BUN 41 (H)  11/18/2022 0955   BUN 15 06/11/2014 1732   CREATININE 1.69 (H) 11/18/2022 0955   CREATININE 0.73 06/11/2014 1732   CALCIUM 9.3 11/18/2022 0955   CALCIUM 8.9 06/11/2014 1732   PROT 7.1 11/18/2022 0955   PROT 6.8 04/18/2019 1328   PROT 7.8 06/11/2014 1732  ALBUMIN 4.1 11/18/2022 0955   ALBUMIN 4.2 04/18/2019 1328   ALBUMIN 3.6 06/11/2014 1732   AST 19 11/18/2022 0955   ALT 11 11/18/2022 0955   ALT 28 06/11/2014 1732   ALKPHOS 43 11/18/2022 0955   ALKPHOS 101 06/11/2014 1732   BILITOT 0.9 11/18/2022 0955   GFRNONAA 34 (L) 11/18/2022 0955   GFRNONAA >60 06/11/2014 1732   GFRAA 58 (L) 12/28/2019 1325   GFRAA >60 06/11/2014 1732     No results found.     Assessment & Plan:   1. Peripheral arterial disease with history of revascularization  Recommend:   The patient does not voice lifestyle limiting changes at this point in time.  Noninvasive studies do not suggest clinically significant change.  No invasive studies, angiography or surgery at this time The patient should continue walking and begin a more formal exercise program.  The patient should continue antiplatelet therapy and aggressive treatment of the lipid abnormalities  No changes in the patient's medications at this time  Continued surveillance is indicated as atherosclerosis is likely to progress with time.    The patient will continue follow up with noninvasive studies as ordered.   2. Essential hypertension Continue antihypertensive medications as already ordered, these medications have been reviewed and there are no changes at this time.  3. Mixed hyperlipidemia Continue statin as ordered and reviewed, no changes at this time   Current Outpatient Medications on File Prior to Visit  Medication Sig Dispense Refill   albuterol (VENTOLIN HFA) 108 (90 Base) MCG/ACT inhaler SMARTSIG:2 Puff(s) By Mouth Every 4 Hours PRN     allopurinol (ZYLOPRIM) 100 MG tablet TAKE 1 TABLET BY MOUTH EVERY DAY 30 tablet 5    Budeson-Glycopyrrol-Formoterol (BREZTRI AEROSPHERE) 160-9-4.8 MCG/ACT AERO Inhale 2 puffs into the lungs in the morning and at bedtime. 10.7 g 11   CALCIUM PO Take by mouth daily at 12 noon.     clopidogrel (PLAVIX) 75 MG tablet Take 1 tablet (75 mg total) by mouth daily. 30 tablet 5   cyanocobalamin 500 MCG tablet Take 500 mcg by mouth daily.     hydroxychloroquine (PLAQUENIL) 200 MG tablet Take 200 mg by mouth 2 (two) times daily.     levothyroxine (SYNTHROID, LEVOTHROID) 75 MCG tablet Take 75 mcg by mouth daily before breakfast.      lisinopril-hydrochlorothiazide (ZESTORETIC) 20-25 MG tablet Take 1 tablet by mouth daily. 90 tablet 3   pantoprazole (PROTONIX) 40 MG tablet Take 1 tablet by mouth once daily 30 tablet 0   predniSONE (DELTASONE) 5 MG tablet Take 1 tablet (5 mg total) by mouth daily with breakfast. 90 tablet 1   rosuvastatin (CRESTOR) 40 MG tablet Take 1 tablet (40 mg total) by mouth daily. 90 tablet 3   VITAMIN D PO Take by mouth daily in the afternoon.     No current facility-administered medications on file prior to visit.    There are no Patient Instructions on file for this visit. No follow-ups on file.   Kris Hartmann, NP

## 2022-12-04 ENCOUNTER — Other Ambulatory Visit: Payer: Self-pay

## 2022-12-07 ENCOUNTER — Ambulatory Visit
Admission: RE | Admit: 2022-12-07 | Discharge: 2022-12-07 | Disposition: A | Discharge status: Home / Self Care | Payer: BC Managed Care – PPO | Source: Ambulatory Visit | Attending: Acute Care | Admitting: Acute Care

## 2022-12-07 ENCOUNTER — Ambulatory Visit: Admission: RE | Admit: 2022-12-07 | Payer: BC Managed Care – PPO | Source: Ambulatory Visit

## 2022-12-07 DIAGNOSIS — R911 Solitary pulmonary nodule: Secondary | ICD-10-CM

## 2022-12-07 DIAGNOSIS — Z87891 Personal history of nicotine dependence: Secondary | ICD-10-CM

## 2022-12-07 LAB — VAS US ABI WITH/WO TBI
Left ABI: 1.04
Right ABI: 1.01

## 2022-12-09 ENCOUNTER — Ambulatory Visit: Payer: BC Managed Care – PPO

## 2022-12-21 ENCOUNTER — Telehealth: Payer: Self-pay | Admitting: *Deleted

## 2022-12-21 ENCOUNTER — Other Ambulatory Visit: Payer: Self-pay | Admitting: Oncology

## 2022-12-21 DIAGNOSIS — D591 Autoimmune hemolytic anemia, unspecified: Secondary | ICD-10-CM

## 2022-12-21 NOTE — Telephone Encounter (Signed)
Patient called to reporting that she is not scheduled for her infusion at [facility] until 01/06/23

## 2022-12-23 ENCOUNTER — Other Ambulatory Visit: Payer: Self-pay

## 2022-12-24 ENCOUNTER — Ambulatory Visit: Payer: BC Managed Care – PPO | Admitting: Oncology

## 2022-12-24 ENCOUNTER — Inpatient Hospital Stay: Payer: BC Managed Care – PPO

## 2022-12-24 ENCOUNTER — Other Ambulatory Visit: Payer: BC Managed Care – PPO

## 2022-12-24 ENCOUNTER — Inpatient Hospital Stay: Payer: BC Managed Care – PPO | Attending: Oncology

## 2022-12-24 ENCOUNTER — Ambulatory Visit: Payer: BC Managed Care – PPO

## 2022-12-24 VITALS — BP 123/63 | HR 88

## 2022-12-24 DIAGNOSIS — E669 Obesity, unspecified: Secondary | ICD-10-CM | POA: Diagnosis not present

## 2022-12-24 DIAGNOSIS — D591 Autoimmune hemolytic anemia, unspecified: Secondary | ICD-10-CM

## 2022-12-24 DIAGNOSIS — N1831 Chronic kidney disease, stage 3a: Secondary | ICD-10-CM | POA: Insufficient documentation

## 2022-12-24 DIAGNOSIS — D5911 Warm autoimmune hemolytic anemia: Secondary | ICD-10-CM | POA: Insufficient documentation

## 2022-12-24 DIAGNOSIS — Z79899 Other long term (current) drug therapy: Secondary | ICD-10-CM | POA: Diagnosis not present

## 2022-12-24 DIAGNOSIS — D631 Anemia in chronic kidney disease: Secondary | ICD-10-CM | POA: Diagnosis present

## 2022-12-24 LAB — CBC WITH DIFFERENTIAL (CANCER CENTER ONLY)
Abs Immature Granulocytes: 0.03 10*3/uL (ref 0.00–0.07)
Basophils Absolute: 0.1 10*3/uL (ref 0.0–0.1)
Basophils Relative: 1 %
Eosinophils Absolute: 0.1 10*3/uL (ref 0.0–0.5)
Eosinophils Relative: 2 %
HCT: 29.9 % — ABNORMAL LOW (ref 36.0–46.0)
Hemoglobin: 9.2 g/dL — ABNORMAL LOW (ref 12.0–15.0)
Immature Granulocytes: 1 %
Lymphocytes Relative: 20 %
Lymphs Abs: 1.1 10*3/uL (ref 0.7–4.0)
MCH: 31 pg (ref 26.0–34.0)
MCHC: 30.8 g/dL (ref 30.0–36.0)
MCV: 100.7 fL — ABNORMAL HIGH (ref 80.0–100.0)
Monocytes Absolute: 0.4 10*3/uL (ref 0.1–1.0)
Monocytes Relative: 6 %
Neutro Abs: 3.8 10*3/uL (ref 1.7–7.7)
Neutrophils Relative %: 70 %
Platelet Count: 155 10*3/uL (ref 150–400)
RBC: 2.97 MIL/uL — ABNORMAL LOW (ref 3.87–5.11)
RDW: 16.2 % — ABNORMAL HIGH (ref 11.5–15.5)
WBC Count: 5.4 10*3/uL (ref 4.0–10.5)
nRBC: 0 % (ref 0.0–0.2)

## 2022-12-24 MED ORDER — EPOETIN ALFA 40000 UNIT/ML IJ SOLN
40000.0000 [IU] | Freq: Once | INTRAMUSCULAR | Status: AC
  Administered 2022-12-24: 40000 [IU] via SUBCUTANEOUS
  Filled 2022-12-24: qty 1

## 2022-12-25 ENCOUNTER — Other Ambulatory Visit: Payer: Self-pay | Admitting: Pulmonary Disease

## 2022-12-31 ENCOUNTER — Ambulatory Visit: Payer: BC Managed Care – PPO

## 2022-12-31 ENCOUNTER — Other Ambulatory Visit: Payer: BC Managed Care – PPO

## 2023-01-05 ENCOUNTER — Encounter: Payer: Self-pay | Admitting: Pulmonary Disease

## 2023-01-05 ENCOUNTER — Other Ambulatory Visit: Payer: Self-pay

## 2023-01-05 ENCOUNTER — Ambulatory Visit (INDEPENDENT_AMBULATORY_CARE_PROVIDER_SITE_OTHER): Payer: BC Managed Care – PPO | Admitting: Pulmonary Disease

## 2023-01-05 VITALS — BP 120/80 | HR 80 | Temp 97.7°F | Ht 61.0 in | Wt 195.8 lb

## 2023-01-05 DIAGNOSIS — M359 Systemic involvement of connective tissue, unspecified: Secondary | ICD-10-CM

## 2023-01-05 DIAGNOSIS — Z87891 Personal history of nicotine dependence: Secondary | ICD-10-CM | POA: Diagnosis not present

## 2023-01-05 DIAGNOSIS — R911 Solitary pulmonary nodule: Secondary | ICD-10-CM

## 2023-01-05 DIAGNOSIS — J4489 Other specified chronic obstructive pulmonary disease: Secondary | ICD-10-CM | POA: Diagnosis not present

## 2023-01-05 MED ORDER — BREZTRI AEROSPHERE 160-9-4.8 MCG/ACT IN AERO: 2.0000 | INHALATION_SPRAY | Freq: Two times a day (BID) | RESPIRATORY_TRACT | 0 refills | Status: DC

## 2023-01-05 NOTE — Patient Instructions (Signed)
I have notified the lung cancer screening program to schedule you for a follow-up CT it would likely be the beginning of July.  We will see her in follow-up after that CT is done.  We have provided you with some samples of Breztri.  Continue using Breztri.

## 2023-01-05 NOTE — Progress Notes (Signed)
Subjective:    Patient ID: Tina Page, female    DOB: 1956/05/29, 67 y.o.   MRN: [id number] Patient Care Team: Abram Sander, MD as PCP - General (Family Medicine) Mick Sell, MD (Infectious Diseases) Glory Buff, RN as Registered Nurse Midge Minium, MD as Consulting Physician (Gastroenterology) Lynn Ito, MD as Consulting Physician (Infectious Diseases) Antonieta Iba, MD as Consulting Physician (Cardiology)  Chief Complaint  Patient presents with   Follow-up    SOB and wheezing with exertion. No cough.    HPI Tina Page is a 67 year old female, former smoker, quit in 2017 (63-pack-year history). 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 coli, impetigo, autoimmune hemolytic anemia, B12 deficiency, elevated liver function testing, hyperlipidemia, pulmonary nodules and undifferentiated connective tissue disease.  She presents today for scheduled follow-up.  Last seen on 14 September 2022 by me.  At that time she had ambulatory oximetry that did not exhibit any clinically significant O2 desaturations.  She was switched to [facility] 2 puffs twice a day due to the cost of Trelegy, she has been doing well on [employer] better controlled.  She has not had any cough.  Dyspnea with exertion is at baseline.  Rare occasional wheezing.  Average use of rescue inhaler once to twice a day.  She has not had any fevers, chills or sweats since her last visit.  No cough as long as she is on ICS inhaler. No sputum production no hemoptysis.  She does not endorse any other symptomatology today.  She has had evanescent pulmonary nodules that have been followed closely by the lung cancer screening program.  Also prior electromagnetic navigation bronchoscopy in April 2019 by Dr. Belia Heman with biopsy of the right middle lobe nodule that showed organizing pneumonia and chronic bronchitis.   She  has had a predominant right lower lobe nodule that is still below threshold for PET/CT measurement and perhaps slightly larger than prior.  I reviewed with her the last CT chest performed 07 December 2022.  A representative images attached to this report.  DATA PFTS: 11/18/2017 PFTs: FEV1 1.22 L or 51% predicted, FVC 1.88 L or 63% predicted, FEV1/FVC 65%, no bronchodilator response.  Lung volumes normal, diffusion capacity moderate to severely impaired. 07/07/2022 PFTs: FEV1 0.86 L or 36% predicted, FVC 1.34 L or 43% predicted, FEV1/FVC 64%, there is significant bronchodilator response with both response in FEV1 and FVC improvement.  No hyperinflation, air trapping evident. Moderate to severe diffusion capacity impairment.  There is modest decline in lung function when compared to prior. Consistent with asthma/COPD overlap. IMAGING: 02/16/18 CT chest wo contrast- showed almost complete resolution of spiculated nodules RML. Stable 5mm right parahilar nodules since March 2019 but not present in 2017.  06/27/19 CT chest wo contrast-  previously described nodule of the right middle lobe has almost completely resolved, with residual linear scarring. There is persistent paramedian consolidation of the medial right middle lobe and lingula generally in keeping with atypical infection, particularly atypical mycobacterium. Interval increase in size of a small nodule of the right middle lobe, now measuring 4 mm, previously 2 mm Unchanged tiny biapical pulmonary nodules, benign sequelae of nonspecific infection or inflammation, possibly related cigarette smoking, atypical infection, or other inhalational, inflammatory lung disease. 12/27/19 CT Chest wo contrast- stable sub-centimeter bilateral pulmonary nodules consistent with benign etiology. Stable paramedian consolidation RML and lingula. New airspace disease inferior RML with a few adjacent subcenterimeter nodules. Overall, consistent  with waxing and waning atypical  infectious process such as MAI 07/24/2020 chest LDCT: Lung RADS 2, benign appearance or behavior.  Moderate centrilobular emphysema, multiple nonsolid and solid tiny nodules largest of which was 4.2 mm 12/02/2021 LDCT chest: New right middle lobe pulmonary nodule with aggressive appearance categorized as lung RADS 4 BS, suspicious.  Mild diffuse bronchial wall thickening and mild centrilobular and paraseptal emphysema imaging findings suggestive of underlying COPD 12/22/2021 PET/CT: The previously noted right middle pulmonary nodule appears to be resolving, no FDG avidity noted.  Consistent with infectious/inflammatory etiology.  Recommend continued radiographic follow-up to resolution.  Hypermetabolic activity in the intercostal muscles and crura of the diaphragm reflecting physiologic activity related to ventilation 03/05/2022 LDCT chest: Lung RADS 3, probably benign findings, new 5.9 mm right lower lobe nodule, short-term follow-up 6 months recommended.  Continued decrease in the size of the right middle lobe nodule. 09/07/2022 LDCT chest: Lung RADS 4A, suspicious, small solid anterior right lower lobe pulmonary nodule measuring 5.2 mm below PET/CT resolution, recommend follow-up CT 3 months. 10/23/2022 echocardiogram: LVEF 60 to 65% mild LVH, no wall motion abnormalities, grade 1 DD, right ventricular systolic function, no pulmonary artery hypertension.  No valvular abnormalities. 12/07/2022 LDCT chest: Lung RADS 4A, suspicious, minimally progressive irregular nodule in the right lower lobe measuring 6.1 mm still below PET/CT resolution, suggest additional follow-up in 3 months.  Review of Systems A 10 point review of systems was performed and it is as noted above otherwise negative.  Patient Active Problem List   Diagnosis Date Noted   Anemia in chronic kidney disease (CKD) 03/30/2022   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   Preseptal cellulitis of right eye 08/29/2021   Leukopenia  11/22/2020   Herpes zoster without complication    Left leg pain 11/17/2020   Diarrhea 09/01/2020   Clostridium difficile diarrhea 08/26/2020   Lupus (HCC) 08/11/2020   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HCC) 08/11/2020   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) 08/11/2020   Papular rash 08/05/2020   Bacteremia due to Escherichia coli 07/17/2020   UTI (urinary tract infection) 07/16/2020   Hypothyroidism    Fever    Encounter for antineoplastic immunotherapy 07/15/2020   Goals of care, counseling/discussion 06/19/2020   Abnormal CT of the chest 06/04/2020   Oral candidiasis 06/04/2020   Former smoker 06/04/2020   Pulmonary nodules 12/28/2019   Stage 3a chronic kidney disease (CKD) (HCC) 05/15/2019   Postop check 04/24/2019   Paronychia of great toe of right foot 04/17/2019   Ingrowing nail 04/17/2019   Medication monitoring encounter 03/25/2018   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   Elevated liver function tests 11/03/2017   Elevated uric acid in blood 11/03/2017   Low serum cortisol level 01/04/2017   Elevated alkaline phosphatase level 10/25/2016   Oral herpes simplex infection 10/25/2016   Current chronic use of systemic steroids 10/23/2016   Impetigo 10/23/2016   Thrombocytopenia (HCC) 10/04/2016   B12 deficiency 08/06/2016   Symptomatic anemia 07/21/2016   Atherosclerosis of native arteries of extremity with intermittent claudication (HCC) 07/20/2016   PAD (peripheral artery disease) (HCC) 07/12/2016   Post PTCA 07/12/2016   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HCC)  07/12/2016   Autoimmune hemolytic anemia, cold antibody type  (HCC) 07/12/2016   Hepatitis B core antibody positive 07/12/2016   Tobacco abuse 07/12/2016   Leg pain 07/07/2016   Essential hypertension 07/07/2016   HLD (hyperlipidemia) 07/07/2016   COPD (chronic obstructive pulmonary disease) (HCC) 07/07/2016   Acute kidney injury superimposed on CKD (HCC) 07/07/2016   Heme positive stool 01/29/2015   Social History   Tobacco Use   Smoking status: Former    Packs/day: 1.25    Years: 47.00    Additional pack years: 0.00    Total pack years: 58.75    Types: Cigarettes    Quit date: 07/11/2016    Years since quitting: 6.4   Smokeless tobacco: Never  Substance Use Topics   Alcohol use: No   Allergies  Allergen Reactions   Ciprofloxacin Swelling    Facial swelling following single oral dose.    Codeine Anaphylaxis   Nsaids Other (See Comments)    Patient has Hep C.    Current Meds  Medication Sig   albuterol (VENTOLIN HFA) 108 (90 Base) MCG/ACT inhaler SMARTSIG:2 Puff(s) By Mouth Every 4 Hours PRN   allopurinol (ZYLOPRIM) 100 MG tablet TAKE 1 TABLET BY MOUTH EVERY DAY   Budeson-Glycopyrrol-Formoterol (BREZTRI AEROSPHERE) 160-9-4.8 MCG/ACT AERO Inhale 2 puffs into the lungs in the morning and at bedtime.   Budeson-Glycopyrrol-Formoterol (BREZTRI AEROSPHERE) 160-9-4.8 MCG/ACT AERO Inhale 2 puffs into the lungs in the morning and at bedtime.   CALCIUM PO Take by mouth daily at 12 noon.   clopidogrel (PLAVIX) 75 MG tablet Take 1 tablet (75 mg total) by mouth daily.   cyanocobalamin 500 MCG tablet Take 500 mcg by mouth daily.   hydroxychloroquine (PLAQUENIL) 200 MG tablet Take 200 mg by mouth 2 (two) times daily.   levothyroxine (SYNTHROID, LEVOTHROID) 75 MCG tablet Take 75 mcg by mouth daily before breakfast.    lisinopril-hydrochlorothiazide (ZESTORETIC) 20-25 MG tablet Take 1 tablet by mouth daily.   pantoprazole (PROTONIX) 40 MG tablet Take 1 tablet by mouth once daily   predniSONE (DELTASONE) 5 MG tablet Take 1 tablet (5 mg total) by  mouth daily with breakfast.   rosuvastatin (CRESTOR) 40 MG tablet Take 1 tablet (40 mg total) by mouth daily.   VITAMIN D PO Take by mouth daily in the afternoon.   Immunization History  Administered Date(s) Administered   HIB (PRP-T) 04/14/2018   Influenza Split 05/29/2011, 05/25/2013   Influenza,inj,Quad PF,6+ Mos 05/17/2018, 07/02/2020, 05/20/2022   Influenza,inj,quad, With Preservative 05/17/2014   Influenza-Unspecified 05/17/2018, 05/16/2019   Meningococcal B, OMV 07/06/2019   Meningococcal Mcv4o 07/06/2019, 07/02/2020   Moderna Sars-Covid-2 Vaccination 11/14/2019, 12/08/2019   Pneumococcal Conjugate-13 04/14/2018   Pneumococcal Polysaccharide-23 07/22/2016   Tdap 05/29/2011, 10/18/2014, 11/18/2020       Objective:   Physical Exam BP 120/80 (BP Location: Left Arm, Cuff Size: Normal)   Pulse 80   Temp 97.7 F (36.5 C)   Ht 5\' 1"  (1.549 m)   Wt 195 lb 12.8 oz (88.8 kg)   SpO2 97%   BMI 37.00 kg/m   SpO2: 97 % O2 Device: None (Room air)  GENERAL: Overweight woman, no acute distress, fully ambulatory.  No conversational dyspnea. HEAD: Normocephalic, atraumatic.  EYES: Pupils equal, round, reactive to light.  No scleral icterus.  MOUTH: Poor dentition, oral mucosa moist.  No thrush. NECK: Supple. No thyromegaly. Trachea midline. No JVD.  No adenopathy. PULMONARY: Good air entry bilaterally.  No adventitious sounds noted. CARDIOVASCULAR: S1 and S2. Regular  rate and rhythm.  No rubs, murmurs or gallops heard. ABDOMEN: Obese, otherwise benign. MUSCULOSKELETAL: No joint deformity, no clubbing, no edema.  NEUROLOGIC: No overt focal deficit, no gait disturbance, speech is fluent. SKIN: Intact,warm,dry.  Multiple petechiae noted.   PSYCH: Mood and behavior normal.  Representative image from CT performed 07 December 2022 showing a 6.1 mm nodule on the right lower lobe (arrow):     Assessment & Plan:     ICD-10-CM   1. Lung nodule RLL 6.1 mm  R91.1    Still below threshold  for PET/CT Follow-up LDCT chest 3 months    2. 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 Undifferentiated connective tissue disease (HCC)  M35.9    This issue adds complexity to her management Follows with rheumatology    4. Former cigarette smoker  Z87.891    No evidence of relapse     Meds ordered this encounter  Medications   Budeson-Glycopyrrol-Formoterol (BREZTRI AEROSPHERE) 160-9-4.8 MCG/ACT AERO    Sig: Inhale 2 puffs into the lungs in the morning and at bedtime.    Dispense:  11.8 g    Refill:  0    Order Specific Question:   Lot Number?    Answer:   [id number]    Order Specific Question:   Expiration Date?    Answer:   03/31/2025    Order Specific Question:   Manufacturer?    Answer:   [employer] [[id number]]    Order Specific Question:   Quantity    Answer:   2   Will see the patient in follow-up in 3 months time after her next CT is performed.  She is to contact us prior to that time should any new difficulties arise.  Gailen Shelter, MD Advanced Bronchoscopy PCCM [facility]    *This note was dictated using voice recognition software/Dragon.  Despite best efforts to proofread, errors can occur which can change the meaning. Any transcriptional errors that result from this process are unintentional and may not be fully corrected at the time of dictation.

## 2023-01-06 ENCOUNTER — Ambulatory Visit
Admit: 2023-01-06 | Discharge: 2023-01-07 | Payer: PRIVATE HEALTH INSURANCE | Attending: Student in an Organized Health Care Education/Training Program | Primary: Student in an Organized Health Care Education/Training Program

## 2023-01-06 DIAGNOSIS — R768 Other specified abnormal immunological findings in serum: Principal | ICD-10-CM

## 2023-01-06 DIAGNOSIS — B181 Chronic viral hepatitis B without delta-agent: Principal | ICD-10-CM

## 2023-01-06 DIAGNOSIS — Z8619 Personal history of other infectious and parasitic diseases: Principal | ICD-10-CM

## 2023-01-06 DIAGNOSIS — D591 AIHA (autoimmune hemolytic anemia) (CMS-HCC): Principal | ICD-10-CM

## 2023-01-07 ENCOUNTER — Ambulatory Visit: Payer: BC Managed Care – PPO

## 2023-01-07 ENCOUNTER — Other Ambulatory Visit: Payer: BC Managed Care – PPO

## 2023-01-09 DIAGNOSIS — Z79899 Other long term (current) drug therapy: Secondary | ICD-10-CM | POA: Insufficient documentation

## 2023-01-09 DIAGNOSIS — W228XXA Striking against or struck by other objects, initial encounter: Secondary | ICD-10-CM | POA: Insufficient documentation

## 2023-01-09 DIAGNOSIS — N179 Acute kidney failure, unspecified: Secondary | ICD-10-CM | POA: Diagnosis not present

## 2023-01-09 DIAGNOSIS — L03115 Cellulitis of right lower limb: Secondary | ICD-10-CM | POA: Diagnosis not present

## 2023-01-09 DIAGNOSIS — J449 Chronic obstructive pulmonary disease, unspecified: Secondary | ICD-10-CM | POA: Insufficient documentation

## 2023-01-09 DIAGNOSIS — I10 Essential (primary) hypertension: Secondary | ICD-10-CM | POA: Diagnosis not present

## 2023-01-09 DIAGNOSIS — N390 Urinary tract infection, site not specified: Secondary | ICD-10-CM | POA: Insufficient documentation

## 2023-01-09 DIAGNOSIS — E039 Hypothyroidism, unspecified: Secondary | ICD-10-CM | POA: Insufficient documentation

## 2023-01-09 DIAGNOSIS — S81801A Unspecified open wound, right lower leg, initial encounter: Secondary | ICD-10-CM | POA: Diagnosis present

## 2023-01-09 DIAGNOSIS — Z7902 Long term (current) use of antithrombotics/antiplatelets: Secondary | ICD-10-CM | POA: Insufficient documentation

## 2023-01-09 DIAGNOSIS — Z79890 Hormone replacement therapy (postmenopausal): Secondary | ICD-10-CM | POA: Diagnosis not present

## 2023-01-09 DIAGNOSIS — Z87891 Personal history of nicotine dependence: Secondary | ICD-10-CM | POA: Insufficient documentation

## 2023-01-09 LAB — CBC WITH DIFFERENTIAL/PLATELET
Abs Immature Granulocytes: 0.02 10*3/uL (ref 0.00–0.07)
Basophils Absolute: 0 10*3/uL (ref 0.0–0.1)
Basophils Relative: 1 %
Eosinophils Absolute: 0.1 10*3/uL (ref 0.0–0.5)
Eosinophils Relative: 1 %
HCT: 31.5 % — ABNORMAL LOW (ref 36.0–46.0)
Hemoglobin: 9.5 g/dL — ABNORMAL LOW (ref 12.0–15.0)
Immature Granulocytes: 0 %
Lymphocytes Relative: 12 %
Lymphs Abs: 0.9 10*3/uL (ref 0.7–4.0)
MCH: 32.2 pg (ref 26.0–34.0)
MCHC: 30.2 g/dL (ref 30.0–36.0)
MCV: 106.8 fL — ABNORMAL HIGH (ref 80.0–100.0)
Monocytes Absolute: 0.5 10*3/uL (ref 0.1–1.0)
Monocytes Relative: 7 %
Neutro Abs: 5.9 10*3/uL (ref 1.7–7.7)
Neutrophils Relative %: 79 %
Platelets: 167 10*3/uL (ref 150–400)
RBC: 2.95 MIL/uL — ABNORMAL LOW (ref 3.87–5.11)
RDW: 17.2 % — ABNORMAL HIGH (ref 11.5–15.5)
WBC: 7.3 10*3/uL (ref 4.0–10.5)
nRBC: 0.3 % — ABNORMAL HIGH (ref 0.0–0.2)

## 2023-01-09 LAB — COMPREHENSIVE METABOLIC PANEL
ALT: 12 U/L (ref 0–44)
AST: 19 U/L (ref 15–41)
Albumin: 4 g/dL (ref 3.5–5.0)
Alkaline Phosphatase: 43 U/L (ref 38–126)
Anion gap: 8 (ref 5–15)
BUN: 37 mg/dL — ABNORMAL HIGH (ref 8–23)
CO2: 25 mmol/L (ref 22–32)
Calcium: 8.4 mg/dL — ABNORMAL LOW (ref 8.9–10.3)
Chloride: 106 mmol/L (ref 98–111)
Creatinine, Ser: 1.81 mg/dL — ABNORMAL HIGH (ref 0.44–1.00)
GFR, Estimated: 31 mL/min — ABNORMAL LOW (ref >60)
Glucose, Bld: 84 mg/dL (ref 70–99)
Potassium: 4.1 mmol/L (ref 3.5–5.1)
Sodium: 139 mmol/L (ref 135–145)
Total Bilirubin: 1.1 mg/dL (ref 0.3–1.2)
Total Protein: 6.9 g/dL (ref 6.5–8.1)

## 2023-01-09 LAB — URINALYSIS, ROUTINE W REFLEX MICROSCOPIC
Bilirubin Urine: NEGATIVE
Glucose, UA: NEGATIVE mg/dL
Hgb urine dipstick: NEGATIVE
Ketones, ur: NEGATIVE mg/dL
Nitrite: NEGATIVE
Protein, ur: NEGATIVE mg/dL
Specific Gravity, Urine: >1.03 — ABNORMAL HIGH (ref 1.005–1.030)
WBC, UA: >50 WBC/hpf (ref 0–5)
pH: 5.5 (ref 5.0–8.0)

## 2023-01-09 LAB — LACTIC ACID, PLASMA: Lactic Acid, Venous: 1.5 mmol/L (ref 0.5–1.9)

## 2023-01-09 NOTE — ED Triage Notes (Signed)
Patient C/O laceration to right lower extremity after hitting her shin on a box at the beginning of the week. Patient has history of similar wound that was infected with MRSA a few years back. Denies nausea, vomiting, or fevers. Patient is not diabetic but also has history of PVD and is prone to delayed wound healing.

## 2023-01-10 ENCOUNTER — Emergency Department
Admission: EM | Admit: 2023-01-10 | Discharge: 2023-01-10 | Disposition: A | Discharge status: Home / Self Care | Payer: BC Managed Care – PPO | Attending: Emergency Medicine | Admitting: Emergency Medicine

## 2023-01-10 DIAGNOSIS — N390 Urinary tract infection, site not specified: Secondary | ICD-10-CM

## 2023-01-10 DIAGNOSIS — N179 Acute kidney failure, unspecified: Secondary | ICD-10-CM

## 2023-01-10 DIAGNOSIS — L03115 Cellulitis of right lower limb: Secondary | ICD-10-CM

## 2023-01-10 MED ORDER — CEPHALEXIN 500 MG PO CAPS: 500.0000 mg | ORAL_CAPSULE | Freq: Three times a day (TID) | ORAL | 0 refills | Status: DC

## 2023-01-10 MED ORDER — SODIUM CHLORIDE 0.9 % IV SOLN
1.0000 g | Freq: Once | INTRAVENOUS | Status: AC
  Administered 2023-01-10: 1 g via INTRAVENOUS
  Filled 2023-01-10: qty 10

## 2023-01-10 MED ORDER — SODIUM CHLORIDE 0.9 % IV BOLUS
500.0000 mL | Freq: Once | INTRAVENOUS | Status: AC
  Administered 2023-01-10: 500 mL via INTRAVENOUS

## 2023-01-10 NOTE — Discharge Instructions (Signed)
1.  Take and finish antibiotic as prescribed (Keflex 500 mg 3 times daily x 7 days).   2.  Drink plenty of fluids daily. 3.  Return to the ER for worsening symptoms, persistent vomiting, fever or other concerns.

## 2023-01-10 NOTE — ED Provider Notes (Signed)
[facility] Provider Note    None    (approximate)   History   Wound Infection   HPI  Tina Page is a 67 y.o. female who presents to the ED from home with a chief complaint of right lower extremity wound.  Patient struck her shin on a box at the beginning of the week.  Presents with quarter sized area of blister with weeping.  History of similar wound that was infected with MRSA and concern for infection.  Denies fever/chills, chest pain, shortness of breath, abdominal pain, nausea, vomiting or dizziness.  Patient does not have a history of diabetes.     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/23/2017   Post PTCA 07/12/2016   iliac stents bilaterally 2017   Thrombocytopenia (HCC) 10/04/2016   Wears dentures    full upper and lower     Active Problem List   Patient Active Problem List   Diagnosis Date Noted   Anemia in chronic kidney disease (CKD) 03/30/2022   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   Preseptal cellulitis of right eye 08/29/2021   Leukopenia 11/22/2020   Herpes zoster without complication    Left leg pain 11/17/2020   Diarrhea 09/01/2020   Clostridium difficile diarrhea 08/26/2020   Lupus (HCC) 08/11/2020   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HCC) 08/11/2020   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) 08/11/2020   Papular rash 08/05/2020   Bacteremia due to Escherichia coli  07/17/2020   UTI (urinary tract infection) 07/16/2020   Hypothyroidism    Fever    Encounter for antineoplastic immunotherapy 07/15/2020   Goals of care, counseling/discussion 06/19/2020   Abnormal CT of the chest 06/04/2020   Oral candidiasis 06/04/2020   Former smoker 06/04/2020   Pulmonary nodules 12/28/2019   Stage 3a chronic kidney disease (CKD) (HCC) 05/15/2019   Postop check 04/24/2019   Paronychia of great toe of right foot 04/17/2019   Ingrowing nail 04/17/2019   Medication monitoring encounter 03/25/2018   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   Elevated liver function tests 11/03/2017   Elevated uric acid in blood 11/03/2017   Low serum cortisol level 01/04/2017   Elevated alkaline phosphatase level 10/25/2016   Oral herpes simplex infection 10/25/2016   Current chronic use of systemic steroids 10/23/2016   Impetigo 10/23/2016   Thrombocytopenia (HCC) 10/04/2016   B12 deficiency 08/06/2016   Symptomatic anemia 07/21/2016   Atherosclerosis of native arteries of extremity with intermittent claudication (HCC) 07/20/2016   PAD (peripheral artery disease) (HCC) 07/12/2016   Post PTCA 07/12/2016   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   Autoimmune hemolytic  anemia, 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/12/2016   Hepatitis B core antibody positive 07/12/2016   Tobacco abuse 07/12/2016   Leg pain 07/07/2016   Essential hypertension 07/07/2016   HLD (hyperlipidemia) 07/07/2016   COPD (chronic obstructive pulmonary disease) (HCC) 07/07/2016   Acute kidney injury superimposed on CKD (HCC) 07/07/2016   Heme positive stool 01/29/2015     Past Surgical  History   Past Surgical History:  Procedure Laterality Date   ELECTROMAGNETIC NAVIGATION BROCHOSCOPY N/A 12/07/2017   Procedure: ELECTROMAGNETIC NAVIGATION BRONCHOSCOPY;  Surgeon: Erin Fulling, MD;  Location: [facility] ORS;  Service: Cardiopulmonary;  Laterality: N/A;   ESOPHAGOGASTRODUODENOSCOPY (EGD) WITH PROPOFOL N/A 07/08/2016   Procedure: ESOPHAGOGASTRODUODENOSCOPY (EGD) WITH PROPOFOL;  Surgeon: Scot Jun, MD;  Location: [facility];  Service: Endoscopy;  Laterality: N/A;   KNEE SURGERY Right    repair of acl tear   PERIPHERAL VASCULAR CATHETERIZATION N/A 07/10/2016   Procedure: Lower Extremity Angiography;  Surgeon: Renford Dills, MD;  Location: [facility] INV[facility]IVE CV [facility];  Service: Cardiovascular;  Laterality: N/A;   PERIPHERAL VASCULAR CATHETERIZATION N/A 07/10/2016   Procedure: Abdominal Aortogram w/Lower Extremity;  Surgeon: Renford Dills, MD;  Location: [facility] INV[facility] CV [facility];  Service: Cardiovascular;  Laterality: N/A;   PERIPHERAL VASCULAR CATHETERIZATION  07/10/2016   Procedure: Lower Extremity Intervention;  Surgeon: Renford Dills, MD;  Location: [facility] INV[facility] CV [facility];  Service: Cardiovascular;;     Home Medications   Prior to Admission medications   Medication Sig Start Date End Date Taking? Authorizing Provider  cephALEXin (KEFLEX) 500 MG capsule Take 1 capsule (500 mg total) by mouth 3 (three) times daily. 01/10/23  Yes Irean Hong, MD  albuterol (VENTOLIN HFA) 108 (90 Base) MCG/ACT inhaler SMARTSIG:2 Puff(s) By Mouth Every 4 Hours PRN 12/30/21   [provider]  allopurinol (ZYLOPRIM) 100 MG tablet TAKE 1 TABLET BY MOUTH EVERY DAY 06/02/19   Rosey Bath, MD  Budeson-Glycopyrrol-Formoterol (BREZTRI AEROSPHERE) 160-9-4.8 MCG/ACT AERO Inhale 2 puffs into the lungs in the morning and at bedtime. 09/23/22   Salena Saner, MD  Budeson-Glycopyrrol-Formoterol (BREZTRI AEROSPHERE) 160-9-4.8 MCG/ACT AERO Inhale 2 puffs into the lungs in the morning and at  bedtime. 01/05/23   Salena Saner, MD  CALCIUM PO Take by mouth daily at 12 noon.    [provider]  clopidogrel (PLAVIX) 75 MG tablet Take 1 tablet (75 mg total) by mouth daily. 07/13/16   Katharina Caper, MD  cyanocobalamin 500 MCG tablet Take 500 mcg by mouth daily.    [provider]  hydroxychloroquine (PLAQUENIL) 200 MG tablet Take 200 mg by mouth 2 (two) times daily.    [provider]  levothyroxine (SYNTHROID, LEVOTHROID) 75 MCG tablet Take 75 mcg by mouth daily before breakfast.  12/30/17   [provider]  lisinopril-hydrochlorothiazide (ZESTORETIC) 20-25 MG tablet Take 1 tablet by mouth daily. 08/25/22   Charlsie Quest, NP  pantoprazole (PROTONIX) 40 MG tablet Take 1 tablet by mouth once daily 12/25/22   Salena Saner, MD  predniSONE (DELTASONE) 5 MG tablet Take 1 tablet (5 mg total) by mouth daily with breakfast. 09/23/22   [name], MD  rosuvastatin (CRESTOR) 40 MG tablet Take 1 tablet (40 mg total) by mouth daily. 08/25/22 08/20/23  Charlsie Quest, NP  VITAMIN D PO Take by mouth daily in the afternoon.    [provider]     Allergies  Ciprofloxacin, Codeine, and Nsaids   Family History   Family History  Problem Relation Age  of Onset   Diabetes Mother    Hypertension Mother    Diabetes Maternal Grandfather    Hypertension Maternal Grandfather    Breast cancer Neg Hx      Physical Exam  Triage Vital Signs: ED Triage Vitals  Enc Vitals Group     BP 01/09/23 1929 (!) 134/55     Pulse Rate 01/09/23 1929 84     Resp 01/09/23 1929 18     Temp 01/09/23 1929 98.6 F (37 C)     Temp Source 01/09/23 1929 Oral     SpO2 01/09/23 1929 96 %     Weight 01/09/23 1925 195 lb 15.8 oz (88.9 kg)     Height 01/09/23 1925 5\' 3"  (1.6 m)     Head Circumference --      Peak Flow --      Pain Score --      Pain Loc --      Pain Edu? --      Excl. in GC? --     Updated Vital Signs: BP (!) 110/49   Pulse 81   Temp 98.2 F (36.8  C) (Oral)   Resp 18   Ht 5\' 3"  (1.6 m)   Wt 88.9 kg   SpO2 100%   BMI 34.72 kg/m    General: Awake, no distress.  CV:  RRR.  Good peripheral perfusion.  Resp:  Normal effort.  TAB. Abd:  No distention.  Other:  RLE: Quarter sized wound to anterior tibia with blisters and weeping.  Surrounding borders with warmth and erythema, no fluctuance, no streaking.  Palpable distal pulses.  Brisk, less than 5-second capillary refill.   ED Results / Procedures / Treatments  Labs (all labs ordered are listed, but only abnormal results are displayed) Labs Reviewed  COMPREHENSIVE METABOLIC PANEL - Abnormal; Notable for the following components:      Result Value   BUN 37 (*)    Creatinine, Ser 1.81 (*)    Calcium 8.4 (*)    GFR, Estimated 31 (*)    All other components within normal limits  CBC WITH DIFFERENTIAL/PLATELET - Abnormal; Notable for the following components:   RBC 2.95 (*)    Hemoglobin 9.5 (*)    HCT 31.5 (*)    MCV 106.8 (*)    RDW 17.2 (*)    nRBC 0.3 (*)    All other components within normal limits  URINALYSIS, ROUTINE W REFLEX MICROSCOPIC - Abnormal; Notable for the following components:   Color, Urine YELLOW (*)    APPearance CLEAR (*)    Specific Gravity, Urine >1.030 (*)    Leukocytes,Ua TRACE (*)    Bacteria, UA RARE (*)    All other components within normal limits  LACTIC ACID, PLASMA     EKG  None   RADIOLOGY None   Official radiology report(s): No results found.   PROCEDURES:  Critical Care performed: No  Procedures   MEDICATIONS ORDERED IN ED: Medications  sodium chloride 0.9 % bolus 500 mL (0 mLs Intravenous Stopped 01/10/23 0218)  cefTRIAXone (ROCEPHIN) 1 g in sodium chloride 0.9 % 100 mL IVPB (0 g Intravenous Stopped 01/10/23 0218)     IMPRESSION / MDM / ASSESSMENT AND PLAN / ED COURSE  I reviewed the triage vital signs and the nursing notes.                             66 year old female presenting with  right lower leg wound.   Differential diagnosis includes but is not limited to cellulitis, abscess, sepsis, hematoma, etc.  I personally reviewed patient's records and note an infectious disease office visit on 01/06/2023 for follow-up chronic hepatitis.  Patient's presentation is most consistent with acute complicated illness / injury requiring diagnostic workup.  Laboratory results demonstrate normal WBC 7.3, negative lactic acid.  AKI with creatinine 1.8, increased from prior.  Urine demonstrates trace leukocytes with greater than 50 WBCs.  Encouraged patient to not cover wound with large Band-Aid.  Will administer IV fluids, Rocephin in the ED, discharge home on Keflex.  Will reassess.  Clinical Course as of 01/10/23 1610  Wynelle Link Jan 10, 2023  0225 IV antibiotics completed.  Strict return precautions given.  Patient verbalizes understanding and agrees with plan of care. [JS]    Clinical Course User Index [JS] Irean Hong, MD     FINAL CLINICAL IMPRESSION(S) / ED DIAGNOSES   Final diagnoses:  AKI (acute kidney injury) (HCC)  Urinary tract infection without hematuria, site unspecified  Cellulitis of right lower extremity     Rx / DC Orders   ED Discharge Orders          Ordered    cephALEXin (KEFLEX) 500 MG capsule  3 times daily        01/10/23 0054             Note:  This document was prepared using Dragon voice recognition software and may include unintentional dictation errors.   Irean Hong, MD 01/10/23 [phone]

## 2023-01-14 ENCOUNTER — Ambulatory Visit: Payer: BC Managed Care – PPO

## 2023-01-14 ENCOUNTER — Other Ambulatory Visit: Payer: BC Managed Care – PPO

## 2023-01-18 ENCOUNTER — Inpatient Hospital Stay (HOSPITAL_BASED_OUTPATIENT_CLINIC_OR_DEPARTMENT_OTHER): Payer: BC Managed Care – PPO

## 2023-01-18 ENCOUNTER — Inpatient Hospital Stay (HOSPITAL_BASED_OUTPATIENT_CLINIC_OR_DEPARTMENT_OTHER): Payer: BC Managed Care – PPO | Admitting: Oncology

## 2023-01-18 ENCOUNTER — Inpatient Hospital Stay: Payer: BC Managed Care – PPO | Attending: Oncology

## 2023-01-18 ENCOUNTER — Encounter: Payer: Self-pay | Admitting: Oncology

## 2023-01-18 VITALS — BP 123/58 | HR 80 | Temp 96.0°F | Resp 18 | Wt 194.0 lb

## 2023-01-18 DIAGNOSIS — D631 Anemia in chronic kidney disease: Secondary | ICD-10-CM | POA: Insufficient documentation

## 2023-01-18 DIAGNOSIS — D5911 Warm autoimmune hemolytic anemia: Secondary | ICD-10-CM | POA: Diagnosis present

## 2023-01-18 DIAGNOSIS — D591 Autoimmune hemolytic anemia, unspecified: Secondary | ICD-10-CM

## 2023-01-18 DIAGNOSIS — N1832 Chronic kidney disease, stage 3b: Secondary | ICD-10-CM | POA: Diagnosis not present

## 2023-01-18 DIAGNOSIS — R768 Other specified abnormal immunological findings in serum: Secondary | ICD-10-CM | POA: Insufficient documentation

## 2023-01-18 DIAGNOSIS — N1831 Chronic kidney disease, stage 3a: Secondary | ICD-10-CM | POA: Diagnosis present

## 2023-01-18 DIAGNOSIS — Z7952 Long term (current) use of systemic steroids: Secondary | ICD-10-CM | POA: Insufficient documentation

## 2023-01-18 DIAGNOSIS — Z87891 Personal history of nicotine dependence: Secondary | ICD-10-CM | POA: Insufficient documentation

## 2023-01-18 DIAGNOSIS — Z955 Presence of coronary angioplasty implant and graft: Secondary | ICD-10-CM | POA: Diagnosis not present

## 2023-01-18 LAB — CMP (CANCER CENTER ONLY)
ALT: 11 U/L (ref 0–44)
AST: 17 U/L (ref 15–41)
Albumin: 4.1 g/dL (ref 3.5–5.0)
Alkaline Phosphatase: 41 U/L (ref 38–126)
Anion gap: 9 (ref 5–15)
BUN: 40 mg/dL — ABNORMAL HIGH (ref 8–23)
CO2: 27 mmol/L (ref 22–32)
Calcium: 9.5 mg/dL (ref 8.9–10.3)
Chloride: 104 mmol/L (ref 98–111)
Creatinine: 2.02 mg/dL — ABNORMAL HIGH (ref 0.44–1.00)
GFR, Estimated: 27 mL/min — ABNORMAL LOW (ref >60)
Glucose, Bld: 82 mg/dL (ref 70–99)
Potassium: 3.8 mmol/L (ref 3.5–5.1)
Sodium: 140 mmol/L (ref 135–145)
Total Bilirubin: 0.8 mg/dL (ref 0.3–1.2)
Total Protein: 7.3 g/dL (ref 6.5–8.1)

## 2023-01-18 LAB — CBC (CANCER CENTER ONLY)
HCT: 31.4 % — ABNORMAL LOW (ref 36.0–46.0)
Hemoglobin: 9.8 g/dL — ABNORMAL LOW (ref 12.0–15.0)
MCH: 31.9 pg (ref 26.0–34.0)
MCHC: 31.2 g/dL (ref 30.0–36.0)
MCV: 102.3 fL — ABNORMAL HIGH (ref 80.0–100.0)
Platelet Count: 207 10*3/uL (ref 150–400)
RBC: 3.07 MIL/uL — ABNORMAL LOW (ref 3.87–5.11)
RDW: 15.9 % — ABNORMAL HIGH (ref 11.5–15.5)
WBC Count: 6.9 10*3/uL (ref 4.0–10.5)
nRBC: 0 % (ref 0.0–0.2)

## 2023-01-18 MED ORDER — EPOETIN ALFA 10000 UNIT/ML IJ SOLN
30000.0000 [IU] | Freq: Once | INTRAMUSCULAR | Status: AC
  Administered 2023-01-18: 30000 [IU] via SUBCUTANEOUS
  Filled 2023-01-18: qty 3

## 2023-01-18 NOTE — Progress Notes (Signed)
Hematology/Oncology Progress note [phone] [phone]      Clinic Day:  01/18/2023  ASSESSMENT & PLAN:   Autoimmune hemolytic anemia (HCC) LDH is normal, adequate B12 and folate level, normal iron panel.  There is increased reticulocyte %-5%.  Cold agglutinin titer negative. Bilirubin is normal. 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  12/22/21 Negative PET scan  05/25/22 Bone marrow biopsy did not reveal any lymphoproliferative diseases. Mild hypocellular marrow, erythroid hyperplasia, mild evidence of spherocytosis, consistent with hemolysis.  Labs are reviewed and discussed with patient. Continue prednisone 5 mg daily.  Proceed with Epogan today 30,000 units Hold off rituximab due to UTI. Hemoglobin has been stable.    Hepatitis B core antibody positive  history of hepatitis B previously on entecavir after her previous rituximab treatments.  Recommend patient to hold off Entecavir for a week as Finnergan to start rituximab today and if kidney function is low. Restart in 1 week resuming that she is able to start with exam in 3 weeks.  CKD (chronic kidney disease) stage 3, GFR 30-59 ml/min (HCC) Encourage oral hydration.  Avoid nephrotoxins.  Kidney function is worse, possibly due to currently on antibiotics.  Repeat kidney function in 1 week.  Anemia in chronic kidney disease (CKD) Patient is on iron pointing replacement therapy.     Orders Placed This Encounter  Procedures   CMP (Cancer Center only)    Standing Status:   Future    Standing Expiration Date:   02/08/2024   CBC (Cancer Center Only)    Standing Status:   Future    Standing Expiration Date:   02/08/2024   Basic Metabolic Panel - Cancer Center Only    Standing Status:   Future    Standing Expiration Date:   01/18/2024    Follow up Per LOS  All  questions were answered. The patient knows to call the clinic with any problems, questions or concerns.  [name], MD, PhD [facility] Hematology Oncology 01/18/2023   Chief Complaint: Tina Page is a 67 y.o. 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  Reticulocyte count was 11.3%.  Ferritin was 562.  Iron studies revealed a saturation of 20% and a TIBC of 214 (low).  B12 was 231 (low normal).  Folate was 42.   Peripheral smear revealed rouleaux formation.    Additional testing included the following + studies:  hepatitis C antibody, hepatitis B core antibody, CMV IgM, and EBV VCA (IgM and IgG).  Hepatitis B by PCR was negative.  Hepatitis C RNA was negative.  Mycoplasma pneumonia IgM was negative.  Reticulocyte count was 11.3% (high) indicating appropriate marrow response.  C3 and C4 were normal.  Cold agglutinin titer was negative x 2.  Negative studies included:  ANA, hepatitis B surface antigen, SPEP, and free light chain ratio.   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  MMA was initially 330 (normal).  Repeat MMA was elevated on 08/01/2016 confirming B12 deficiency.  Hepatitis B surface antibody was positive and hepatitis B surface antigen was negative on 08/06/2016.  07/07/2016  Chest, abdomen, and pelvis CT angiogram on revealed moderate diffuse atherosclerotic vascular disease of the abdominal aorta with severe stenosis of the left common iliac artery with suspected short segment occlusion. There was no adenopathy.  Spleen was normal.   04/15/2017 Abd US revealed a normal spleen and sludge in the gallbladder.  The liver  was echogenic consistent fatty infiltration and/or hepatocellular disease.  For hemolytic anemia treatments, in 2017, She has received multiple units of  warmed PRBCs to date.  08/01/2016-03/12/2017, steroid treatment with prednisone.  She is also on folic acid 1 mg daily. Borderline low vitamin B12 level on 07/09/2016.  Patient has been on oral vitamin B12 supplementation.  10/12/2017  positive warm autoantibody.  LDH and bilirubin were normal.   She began prednisone 10 mg on 12/07/2017 (discontinued 03/2018).  She received Rituxan weekly x 4 (last dose 06/16/2018).  Entecavir was discontinued on 07/06/2019.  06/12/2020 developed recurrent anemia.  Hematocrit was 27.6, hemoglobin 8.4, MCV 104.5, platelets 138,000, WBC 3,400 (ANC 2,300). Creatinine was 1.17 (CrCl 50 ml/min).  Positive warm autoantibody  Reticulocyte count was 7.1%.  LDH was 204 (98-192).  06/16/2020, began prednisone 1 mg/kilogram treatments, 07/24/2020, prednisone was tapered down to 20 mg.  She received Rituxan 1000 mg on day 1 and 15 (07/15/2020 and 07/29/2020).  She began entecavir on 06/28/2020.  Prednisone was tapered off.  She was on Septra for PCP prophylaxis which is now currently on hold    Other medical problems 07/10/2016.  She underwent PTCA and stent placement in right and left common iliac arteries and left external iliac artery. She is on Plavix.   03/19/2015 EGD revealed gastritis in the body and antrum.  Colonoscopy revealed one hyperplastic polyp.  Repeat EGD on 07/08/2016 was normal.  No evidence of bleeding.  History of she developed peri-oral and intranasal herpes simplex-1.  She was treated with valacyclovir and doxycycline on 10/19/2016.   10/26/2017. She developed flu like symptoms.  Symptoms included cough, myalgias, and fever (tmax unknown).  She was prescribed Mucinex, Tamiflu, and amoxicillin.  She only took amoxicillin x 5 days.  She developed increased liver function tests on 11/02/2017. CMV IgG was  positive.  EBV VCA IgG, NA IgG, early antigen antibody IgG were elevated.  EBV VCA IgM was < 36.  Testing was c/w a convalescence/past infection or reactivated infection.  LFTs normalized on 11/15/2017.  Smooth muscle antibody was 39 (high) on 11/10/2017 and 34 (high) on 11/30/2017.     Chest CT on 11/16/2017 revealed a 2.2 x 2.0 x 2.4 spiculated RIGHT middle lobe mass.  There was tiny nonspecific upper lobe pulmonary nodules greater on RIGHT, largest 3 mm, of uncertain etiology.  There was additional 8 x 8 mm opacity in the posterior sulcus of the RIGHT lower lobe. PET scan on 11/22/2017 revealed a 2 cm hypermetabolic right middle lobe lung mass (SUV 3.4) consistent  with primary lung neoplasm.  There were no findings for mediastinal/hilar lymphadenopathy or metastatic disease.  There were areas of hypermetabolism involving the left oblique abdominal muscles and the anorectal junction. PFTs on 11/18/2017 revealed an FEV1 of 1.22 liters (51%).  DLCO adj was 6.8 mL/mmHg/min (32%).   ENB done on 12/07/2017. Cytology was negative for malignancy. Pathology demonstrated organizing pneumonia and chronic bronchitis. Pathologist commented that organizing pneumonia spanned about 2 mm in one fragment. Cases of focal organizing pneumonia with hypermetabolism on FDG-PET have been described, but changes of this type can also be adjacent to a neoplasm. Patient  prescribed a daily dose of Prednisone 10 mg   07/24/2020  repeat  chest CT on  revealed Lung-RADS 2, benign appearance or behavior. There was emphysema, aortic atherosclerosis, and coronary artery calcifications.  diarrhea.  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  She received azithromycin x 3 days.  She received ciprofloxacin x 10 days without improvement.  She has seen ID in [place].  She began Bactrim on 03/04/2018 (discontinued on 03/11/2018) secondary to increase in creatine.   Chronic kidney disease Urinalysis on  11/15/2017 revealed revealed no hemoglobin, bilirubin or active sediment.   Liver function tests increased on 02/15/2019.  Work-up on 02/16/2019 revealed hepatitis B E antibody positive.  Negative studies included: hepatitis A total antibody, hepatitis A IgM, hepatitis B surface antigen, hepatitis B E antigen.  Hepatitis B DNA was not detected.  Folate was 80.5 and vitamin B12 was 443.  She has received her vaccinations:  She has completed the PCV13, PPSV23, and HiB.  She received Menvio (quadrivalent meningoccal vaccine) and Bexero (univalent serogroup B meningoccal vaccine) on 07/06/2019.  She was diagnosed with C difficile + diarrhea on 08/12/2020.  She completed a course of oral vancomycin.  Stool was negative for C. difficile on 08/27/2020.  11/17/2020 - 11/20/2020 [facility] admission with left L5 varicella zoster.  Lesion was positive for VZV. She was treated with ceftazidime and a valacyclovir.  She was discharged on 9 days of Valtrex 1 gm 3 times daily followed by 500 mg daily indefinitely for suppression and doxycycline.  History of discoid lupus  #Positive hepatitis B core antibody Previously on entecavir 0.5 mg daily, currently she has finished a course. Dose adjusted based on renal function. CrCl 30-49 ml/min   50% dosing (0.5 mg QOD) CrCl >= 50 ml/min 100% dosing (0.5 mg a day). With her kidney function currently QOD She has completed course of Entecavir and stopped 08/14/2021  # PCP prophylaxis Patient was on Septra DS on Mondays, Wednesdays, and Fridays, which was held due to fluctuated kidney functions.  Currently off Septra.  12/03/2021, CT chest lung cancer screening showed new right middle lobe pulmonary nodule with an aggressive appearance characterized as lung RADS 4 VS.  12/22/2021 PET showed No abnormal FDG avidity associated with the resolving right middle lobe pulmonary nodule now with a 12 mm ground-glass opacity in the area of prior solid nodularity, most consistent with  an infectious or inflammatory etiology  INTERVAL HISTORY BRIZZA ZENISEK is a 67 y.o. female who has above history reviewed by me today presents for follow up visit  Patient remained on prednisone 5mg  daily.  Patient continues to have weight gain Chronic fatigue unchanged. S/p IVIG, tolerated well.   She went to emergency room for catheter extremity.  Found to have elevated creatinine compared to her prior levels. Found to have UTI, patient got IV fluid, Rocephin in emergency room, discharged home with Keflex.  Patient is on Keflex antibiotics course.   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   Elevated liver function tests 11/03/2017   GERD (gastroesophageal reflux disease)    Heme positive stool 01/29/2015   Hepatitis C 07/12/2016   Ab positive, RNA negative   HLD (hyperlipidemia)    Hypertension    Hypothyroidism    Mass of middle lobe  of right lung 11/23/2017   Post PTCA 07/12/2016   iliac stents bilaterally 2017   Thrombocytopenia (HCC) 10/04/2016   Wears dentures    full upper and lower    Past Surgical History:  Procedure Laterality Date   ELECTROMAGNETIC NAVIGATION BROCHOSCOPY N/A 12/07/2017   Procedure: ELECTROMAGNETIC NAVIGATION BRONCHOSCOPY;  Surgeon: Erin Fulling, MD;  Location: [facility] ORS;  Service: Cardiopulmonary;  Laterality: N/A;   ESOPHAGOGASTRODUODENOSCOPY (EGD) WITH PROPOFOL N/A 07/08/2016   Procedure: ESOPHAGOGASTRODUODENOSCOPY (EGD) WITH PROPOFOL;  Surgeon: Scot Jun, MD;  Location: [facility] ENDOSCOPY;  Service: Endoscopy;  Laterality: N/A;   KNEE SURGERY Right    repair of acl tear   PERIPHERAL VASCULAR CATHETERIZATION N/A 07/10/2016   Procedure: Lower Extremity Angiography;  Surgeon: Renford Dills, MD;  Location: [facility] INVASIVE CV  LAB;  Service: Cardiovascular;  Laterality: N/A;   PERIPHERAL VASCULAR CATHETERIZATION N/A 07/10/2016   Procedure: Abdominal Aortogram w/Lower Extremity;  Surgeon: Renford Dills, MD;  Location: [facility] INVASIVE CV LAB;  Service: Cardiovascular;  Laterality: N/A;   PERIPHERAL VASCULAR CATHETERIZATION  07/10/2016   Procedure: Lower Extremity Intervention;  Surgeon: Renford Dills, MD;  Location: [facility] INVASIVE CV LAB;  Service: Cardiovascular;;    Family History  Problem Relation Age of Onset   Diabetes Mother    Hypertension Mother    Diabetes Maternal Grandfather    Hypertension Maternal Grandfather    Breast cancer Neg Hx     Social History:  reports that she quit smoking about 6 years ago. Her smoking use included cigarettes. She has a 58.75 pack-year smoking history. She has never used smokeless tobacco. She reports that she does not drink alcohol and does not use drugs.  Former smoker, 58-pack-year smoking history.   She has a daughter, Yehuda Mao and a daughter named [name].  She lives in [place]. The patient is alone today.  Allergies:  Allergies  Allergen Reactions   Ciprofloxacin Swelling    Facial swelling following single oral dose.    Codeine Anaphylaxis   Nsaids Other (See Comments)    Patient has Hep C.     Current Medications: Current Outpatient Medications  Medication Sig Dispense Refill   albuterol (VENTOLIN HFA) 108 (90 Base) MCG/ACT inhaler SMARTSIG:2 Puff(s) By Mouth Every 4 Hours PRN     allopurinol (ZYLOPRIM) 100 MG tablet TAKE 1 TABLET BY MOUTH EVERY DAY 30 tablet 5   Budeson-Glycopyrrol-Formoterol (BREZTRI AEROSPHERE) 160-9-4.8 MCG/ACT AERO Inhale 2 puffs into the lungs in the morning and at bedtime. 10.7 g 11   Budeson-Glycopyrrol-Formoterol (BREZTRI AEROSPHERE) 160-9-4.8 MCG/ACT AERO Inhale 2 puffs into the lungs in the morning and at bedtime. 11.8 g 0   CALCIUM PO Take by mouth daily at 12 noon.     cephALEXin (KEFLEX) 500 MG capsule Take 1 capsule  (500 mg total) by mouth 3 (three) times daily. 21 capsule 0   clopidogrel (PLAVIX) 75 MG tablet Take 1 tablet (75 mg total) by mouth daily. 30 tablet 5   cyanocobalamin 500 MCG tablet Take 500 mcg by mouth daily.     entecavir (BARACLUDE) 0.5 MG tablet Take 0.5 mg by mouth every other day.     hydroxychloroquine (PLAQUENIL) 200 MG tablet Take 200 mg by mouth 2 (two) times daily.     levothyroxine (SYNTHROID, LEVOTHROID) 75 MCG tablet Take 75 mcg by mouth daily before breakfast.      lisinopril-hydrochlorothiazide (ZESTORETIC) 20-25 MG tablet Take 1 tablet by mouth daily. 90 tablet 3   pantoprazole (PROTONIX) 40 MG tablet Take  1 tablet by mouth once daily 30 tablet 0   predniSONE (DELTASONE) 5 MG tablet Take 1 tablet (5 mg total) by mouth daily with breakfast. 90 tablet 1   rosuvastatin (CRESTOR) 40 MG tablet Take 1 tablet (40 mg total) by mouth daily. 90 tablet 3   VITAMIN D PO Take by mouth daily in the afternoon.     No current facility-administered medications for this visit.    Review of Systems  Constitutional:  Negative for appetite change, chills, fatigue and fever.  HENT:   Negative for hearing loss and voice change.   Eyes:  Negative for eye problems.  Respiratory:  Negative for chest tightness and cough.   Cardiovascular:  Negative for chest pain.  Gastrointestinal:  Negative for abdominal distention, abdominal pain and blood in stool.  Endocrine: Negative for hot flashes.  Genitourinary:  Negative for difficulty urinating and frequency.   Musculoskeletal:  Negative for arthralgias.  Skin:  Negative for itching and rash.  Neurological:  Negative for extremity weakness.  Hematological:  Negative for adenopathy.  Psychiatric/Behavioral:  Negative for confusion. The patient is not nervous/anxious.      Performance status (ECOG): 1  Vital Signs Blood pressure (!) 123/58, pulse 80, temperature (!) 96 F (35.6 C), temperature source Tympanic, resp. rate 18, weight 194 lb (88  kg), SpO2 98 %.  Physical Exam Constitutional:      General: She is not in acute distress.    Appearance: She is obese. She is not diaphoretic.  HENT:     Head: Normocephalic and atraumatic.     Nose: Nose normal.     Mouth/Throat:     Pharynx: No oropharyngeal exudate.  Eyes:     General: No scleral icterus.    Pupils: Pupils are equal, round, and reactive to light.  Cardiovascular:     Rate and Rhythm: Normal rate and regular rhythm.     Heart sounds: No murmur heard. Pulmonary:     Effort: Pulmonary effort is normal. No respiratory distress.  Abdominal:     General: There is no distension.     Palpations: Abdomen is soft.     Tenderness: There is no abdominal tenderness.  Musculoskeletal:        General: Normal range of motion.     Cervical back: Normal range of motion and neck supple.  Skin:    General: Skin is warm and dry.     Findings: No erythema.  Neurological:     Mental Status: She is alert and oriented to person, place, and time. Mental status is at baseline.     Cranial Nerves: No cranial nerve deficit.     Motor: No abnormal muscle tone.  Psychiatric:        Mood and Affect: Mood and affect normal.      Laboratory findings     Latest Ref Rng & Units 01/18/2023    7:54 AM 01/09/2023    7:32 PM 12/24/2022    8:37 AM  CBC  WBC 4.0 - 10.5 K/uL 6.9  7.3  5.4   Hemoglobin 12.0 - 15.0 g/dL 9.8  9.5  9.2   Hematocrit 36.0 - 46.0 % 31.4  31.5  29.9   Platelets 150 - 400 K/uL 207  167  155       Latest Ref Rng & Units 01/18/2023    7:54 AM 01/09/2023    7:32 PM 11/18/2022    9:55 AM  CMP  Glucose 70 - 99 mg/dL 82  84  90   BUN 8 - 23 mg/dL 40  37  41   Creatinine 0.44 - 1.00 mg/dL 1.61  0.96  0.45   Sodium 135 - 145 mmol/L 140  139  139   Potassium 3.5 - 5.1 mmol/L 3.8  4.1  4.0   Chloride 98 - 111 mmol/L 104  106  102   CO2 22 - 32 mmol/L 27  25  28    Calcium 8.9 - 10.3 mg/dL 9.5  8.4  9.3   Total Protein 6.5 - 8.1 g/dL 7.3  6.9  7.1   Total Bilirubin  0.3 - 1.2 mg/dL 0.8  1.1  0.9   Alkaline Phos 38 - 126 U/L 41  43  43   AST 15 - 41 U/L 17  19  19    ALT 0 - 44 U/L 11  12  11

## 2023-01-18 NOTE — Assessment & Plan Note (Addendum)
history of hepatitis B previously on entecavir after her previous rituximab treatments.  Recommend patient to hold off Entecavir for a week as Tina Page to start rituximab today and if kidney function is low. Restart in 1 week resuming that she is able to start with exam in 3 weeks.

## 2023-01-18 NOTE — Assessment & Plan Note (Addendum)
LDH is normal, adequate B12 and folate level, normal iron panel.  There is increased reticulocyte %-5%.  Cold agglutinin titer negative. Bilirubin is normal. 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  12/22/21 Negative PET scan  05/25/22 Bone marrow biopsy did not reveal any lymphoproliferative diseases. Mild hypocellular marrow, erythroid hyperplasia, mild evidence of spherocytosis, consistent with hemolysis.  Labs are reviewed and discussed with patient. Continue prednisone 5 mg daily.  Proceed with Epogan today 30,000 units Hold off rituximab due to UTI. Hemoglobin has been stable.

## 2023-01-18 NOTE — Assessment & Plan Note (Signed)
Encourage oral hydration.  Avoid nephrotoxins.  Kidney function is worse, possibly due to currently on antibiotics.  Repeat kidney function in 1 week.

## 2023-01-18 NOTE — Assessment & Plan Note (Signed)
Patient is on iron pointing replacement therapy.

## 2023-01-20 ENCOUNTER — Other Ambulatory Visit: Payer: Self-pay | Admitting: Pulmonary Disease

## 2023-01-26 ENCOUNTER — Inpatient Hospital Stay: Payer: BC Managed Care – PPO

## 2023-01-26 ENCOUNTER — Other Ambulatory Visit: Payer: BC Managed Care – PPO

## 2023-01-26 ENCOUNTER — Ambulatory Visit: Payer: BC Managed Care – PPO

## 2023-01-26 DIAGNOSIS — D591 Autoimmune hemolytic anemia, unspecified: Secondary | ICD-10-CM

## 2023-01-26 DIAGNOSIS — D5911 Warm autoimmune hemolytic anemia: Secondary | ICD-10-CM | POA: Diagnosis not present

## 2023-01-26 DIAGNOSIS — N1832 Chronic kidney disease, stage 3b: Secondary | ICD-10-CM | POA: Diagnosis not present

## 2023-01-26 LAB — BASIC METABOLIC PANEL - CANCER CENTER ONLY
Anion gap: 9 (ref 5–15)
BUN: 43 mg/dL — ABNORMAL HIGH (ref 8–23)
CO2: 28 mmol/L (ref 22–32)
Calcium: 9.4 mg/dL (ref 8.9–10.3)
Chloride: 103 mmol/L (ref 98–111)
Creatinine: 1.88 mg/dL — ABNORMAL HIGH (ref 0.44–1.00)
GFR, Estimated: 29 mL/min — ABNORMAL LOW (ref >60)
Glucose, Bld: 82 mg/dL (ref 70–99)
Potassium: 3.9 mmol/L (ref 3.5–5.1)
Sodium: 140 mmol/L (ref 135–145)

## 2023-02-01 ENCOUNTER — Other Ambulatory Visit: Payer: BC Managed Care – PPO

## 2023-02-01 ENCOUNTER — Ambulatory Visit: Payer: BC Managed Care – PPO

## 2023-02-08 ENCOUNTER — Inpatient Hospital Stay: Payer: BC Managed Care – PPO

## 2023-02-08 ENCOUNTER — Other Ambulatory Visit: Payer: BC Managed Care – PPO

## 2023-02-08 ENCOUNTER — Encounter: Payer: Self-pay | Admitting: Oncology

## 2023-02-08 ENCOUNTER — Inpatient Hospital Stay: Payer: BC Managed Care – PPO | Attending: Oncology

## 2023-02-08 ENCOUNTER — Ambulatory Visit: Payer: BC Managed Care – PPO

## 2023-02-08 ENCOUNTER — Inpatient Hospital Stay (HOSPITAL_BASED_OUTPATIENT_CLINIC_OR_DEPARTMENT_OTHER): Payer: BC Managed Care – PPO | Admitting: Oncology

## 2023-02-08 VITALS — BP 121/58 | HR 81 | Temp 95.0°F | Resp 18 | Wt 194.1 lb

## 2023-02-08 DIAGNOSIS — Z955 Presence of coronary angioplasty implant and graft: Secondary | ICD-10-CM | POA: Diagnosis not present

## 2023-02-08 DIAGNOSIS — N1831 Chronic kidney disease, stage 3a: Secondary | ICD-10-CM | POA: Insufficient documentation

## 2023-02-08 DIAGNOSIS — Z87891 Personal history of nicotine dependence: Secondary | ICD-10-CM | POA: Insufficient documentation

## 2023-02-08 DIAGNOSIS — D591 Autoimmune hemolytic anemia, unspecified: Secondary | ICD-10-CM

## 2023-02-08 DIAGNOSIS — D580 Hereditary spherocytosis: Secondary | ICD-10-CM | POA: Diagnosis not present

## 2023-02-08 DIAGNOSIS — Z833 Family history of diabetes mellitus: Secondary | ICD-10-CM | POA: Insufficient documentation

## 2023-02-08 DIAGNOSIS — Z7902 Long term (current) use of antithrombotics/antiplatelets: Secondary | ICD-10-CM | POA: Insufficient documentation

## 2023-02-08 DIAGNOSIS — Z8249 Family history of ischemic heart disease and other diseases of the circulatory system: Secondary | ICD-10-CM | POA: Insufficient documentation

## 2023-02-08 DIAGNOSIS — D631 Anemia in chronic kidney disease: Secondary | ICD-10-CM | POA: Diagnosis present

## 2023-02-08 DIAGNOSIS — N1832 Chronic kidney disease, stage 3b: Secondary | ICD-10-CM

## 2023-02-08 DIAGNOSIS — Z7952 Long term (current) use of systemic steroids: Secondary | ICD-10-CM | POA: Insufficient documentation

## 2023-02-08 DIAGNOSIS — R768 Other specified abnormal immunological findings in serum: Secondary | ICD-10-CM | POA: Diagnosis not present

## 2023-02-08 DIAGNOSIS — D5911 Warm autoimmune hemolytic anemia: Secondary | ICD-10-CM | POA: Insufficient documentation

## 2023-02-08 LAB — CMP (CANCER CENTER ONLY)
ALT: 12 U/L (ref 0–44)
AST: 17 U/L (ref 15–41)
Albumin: 4.1 g/dL (ref 3.5–5.0)
Alkaline Phosphatase: 42 U/L (ref 38–126)
Anion gap: 6 (ref 5–15)
BUN: 41 mg/dL — ABNORMAL HIGH (ref 8–23)
CO2: 29 mmol/L (ref 22–32)
Calcium: 8.4 mg/dL — ABNORMAL LOW (ref 8.9–10.3)
Chloride: 106 mmol/L (ref 98–111)
Creatinine: 1.71 mg/dL — ABNORMAL HIGH (ref 0.44–1.00)
GFR, Estimated: 33 mL/min — ABNORMAL LOW (ref >60)
Glucose, Bld: 101 mg/dL — ABNORMAL HIGH (ref 70–99)
Potassium: 3.6 mmol/L (ref 3.5–5.1)
Sodium: 141 mmol/L (ref 135–145)
Total Bilirubin: 0.9 mg/dL (ref 0.3–1.2)
Total Protein: 7.1 g/dL (ref 6.5–8.1)

## 2023-02-08 LAB — CBC (CANCER CENTER ONLY)
HCT: 31 % — ABNORMAL LOW (ref 36.0–46.0)
Hemoglobin: 9.6 g/dL — ABNORMAL LOW (ref 12.0–15.0)
MCH: 32.4 pg (ref 26.0–34.0)
MCHC: 31 g/dL (ref 30.0–36.0)
MCV: 104.7 fL — ABNORMAL HIGH (ref 80.0–100.0)
Platelet Count: 160 10*3/uL (ref 150–400)
RBC: 2.96 MIL/uL — ABNORMAL LOW (ref 3.87–5.11)
RDW: 16.8 % — ABNORMAL HIGH (ref 11.5–15.5)
WBC Count: 6.5 10*3/uL (ref 4.0–10.5)
nRBC: 0 % (ref 0.0–0.2)

## 2023-02-08 MED ORDER — PREDNISONE 5 MG PO TABS: 5.0000 mg | ORAL_TABLET | Freq: Every day | ORAL | 1 refills | Status: DC

## 2023-02-08 NOTE — Assessment & Plan Note (Signed)
Encourage oral hydration. Avoid nephrotoxins. 

## 2023-02-08 NOTE — Progress Notes (Signed)
Hematology/Oncology Progress note [phone] [phone]      Clinic Day:  02/08/2023  ASSESSMENT & PLAN:   Autoimmune hemolytic anemia (HCC) LDH is normal, adequate B12 and folate level, normal iron panel.  There is increased reticulocyte %-5%.  Cold agglutinin titer negative. Bilirubin is normal. 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  12/22/21 Negative PET scan  05/25/22 Bone marrow biopsy did not reveal any lymphoproliferative diseases. Mild hypocellular marrow, erythroid hyperplasia, mild evidence of spherocytosis, consistent with hemolysis.  Labs are reviewed and discussed with patient. Continue prednisone 5 mg daily.  Hold off rituximab as she forgets to start entecavir 2 weeks ago.  Hemoglobin has been stable.    Hepatitis B core antibody positive History of hepatitis B previously on entecavir after her previous rituximab treatments.  Recommend patient to start Entecavir 0.5mg  For GFR >=30, take every other day For GFR <30, take every 72 hours.  She needs to start entecavir for 2 weeks prior to start of Rituximab.    Anemia in chronic kidney disease (CKD) Hb is stable.  monitor.   CKD (chronic kidney disease) stage 3, GFR 30-59 ml/min (HCC) Encourage oral hydration.  Avoid nephrotoxins.       Orders Placed This Encounter  Procedures   Basic Metabolic Panel - Cancer Center Only    Standing Status:   Future    Standing Expiration Date:   02/29/2024   Basic Metabolic Panel - Cancer Center Only    Standing Status:   Future    Standing Expiration Date:   03/07/2024   Basic Metabolic Panel - Cancer Center Only    Standing Status:   Future    Standing Expiration Date:   03/14/2024   CMP (Cancer Center only)    Standing Status:   Future    Standing Expiration Date:   02/22/2024   CBC (Cancer Center Only)     Standing Status:   Future    Standing Expiration Date:   02/22/2024    Follow up 2 weeks lab MD Rituximab  All questions were answered. The patient knows to call the clinic with any problems, questions or concerns.  [name], MD, PhD [facility] Hematology Oncology 02/08/2023   Chief Complaint: Tina Page is a 67 y.o. 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  Reticulocyte count was 11.3%.  Ferritin was 562.  Iron studies revealed a saturation of 20% and a TIBC of 214 (low).  B12 was 231 (low normal).  Folate was 42.   Peripheral smear revealed rouleaux formation.    Additional testing included the following + studies:  hepatitis C antibody, hepatitis B core antibody, CMV IgM, and EBV VCA (IgM and IgG).  Hepatitis B by PCR was negative.  Hepatitis C RNA was negative.  Mycoplasma pneumonia IgM was negative.  Reticulocyte count was 11.3% (high) indicating appropriate marrow response.  C3 and C4 were normal.  Cold agglutinin titer was negative x 2.  Negative studies included:  ANA, hepatitis B surface antigen, SPEP, and free light chain ratio.   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  MMA was initially 330 (normal).  Repeat MMA was elevated on 08/01/2016 confirming B12 deficiency.  Hepatitis B surface antibody was positive and hepatitis B surface antigen was negative on 08/06/2016.   07/07/2016  Chest, abdomen, and pelvis CT angiogram on revealed moderate diffuse atherosclerotic vascular disease of the abdominal aorta with severe stenosis of the left common iliac artery with suspected short segment occlusion. There was no  adenopathy.  Spleen was normal.   04/15/2017 Abd US revealed a normal spleen and sludge in the gallbladder.  The liver was echogenic consistent fatty infiltration and/or hepatocellular disease.  For hemolytic anemia treatments, in 2017, She has received multiple units of  warmed PRBCs to date.  08/01/2016-03/12/2017, steroid treatment with prednisone.  She is also on folic acid 1 mg daily. Borderline low vitamin B12 level on 07/09/2016.  Patient has been on oral vitamin B12 supplementation.  10/12/2017  positive warm autoantibody.  LDH and bilirubin were normal.   She began prednisone 10 mg on 12/07/2017 (discontinued 03/2018).  She received Rituxan weekly x 4 (last dose 06/16/2018).  Entecavir was discontinued on 07/06/2019.  06/12/2020 developed recurrent anemia.  Hematocrit was 27.6, hemoglobin 8.4, MCV 104.5, platelets 138,000, WBC 3,400 (ANC 2,300). Creatinine was 1.17 (CrCl 50 ml/min).  Positive warm autoantibody  Reticulocyte count was 7.1%.  LDH was 204 (98-192).  06/16/2020, began prednisone 1 mg/kilogram treatments, 07/24/2020, prednisone was tapered down to 20 mg.  She received Rituxan 1000 mg on day 1 and 15 (07/15/2020 and 07/29/2020).  She began entecavir on 06/28/2020.  Prednisone was tapered off.  She was on Septra for PCP prophylaxis which is now currently on hold    Other medical problems 07/10/2016.  She underwent PTCA and stent placement in right and left common iliac arteries and left external iliac artery. She is on Plavix.   03/19/2015 EGD revealed gastritis in the body and antrum.  Colonoscopy revealed one hyperplastic polyp.  Repeat EGD on 07/08/2016 was normal.  No evidence of bleeding.  History of she developed peri-oral and intranasal herpes simplex-1.  She was treated with valacyclovir and doxycycline on 10/19/2016.   10/26/2017. She developed flu like symptoms.  Symptoms included cough, myalgias, and fever (tmax unknown).  She was prescribed Mucinex, Tamiflu, and  amoxicillin.  She only took amoxicillin x 5 days.  She developed increased liver function tests on 11/02/2017. CMV IgG was positive.  EBV VCA IgG, NA IgG, early antigen antibody IgG were elevated.  EBV VCA IgM was < 36.  Testing was c/w a convalescence/past infection or reactivated infection.  LFTs normalized on 11/15/2017.  Smooth muscle antibody was 39 (high) on 11/10/2017 and 34 (high) on 11/30/2017.     Chest CT on 11/16/2017 revealed a 2.2 x 2.0 x 2.4 spiculated RIGHT middle lobe mass.  There was tiny nonspecific upper lobe pulmonary nodules greater on RIGHT, largest 3 mm, of uncertain etiology.  There was additional 8 x 8 mm opacity in the posterior sulcus of the RIGHT lower lobe. PET scan on 11/22/2017 revealed a 2 cm hypermetabolic right middle lobe lung mass (SUV 3.4) consistent  with primary lung neoplasm.  There were no findings for mediastinal/hilar lymphadenopathy or metastatic disease.  There were areas of hypermetabolism involving the left oblique abdominal muscles and the anorectal junction. PFTs on 11/18/2017 revealed an FEV1 of 1.22 liters (51%).  DLCO adj was 6.8 mL/mmHg/min (32%).   ENB done on 12/07/2017. Cytology was negative for malignancy. Pathology demonstrated organizing pneumonia and chronic bronchitis.  Pathologist commented that organizing pneumonia spanned about 2 mm in one fragment. Cases of focal organizing pneumonia with hypermetabolism on FDG-PET have been described, but changes of this type can also be adjacent to a neoplasm. Patient prescribed a daily dose of Prednisone 10 mg   07/24/2020  repeat  chest CT on  revealed Lung-RADS 2, benign appearance or behavior. There was emphysema, aortic atherosclerosis, and coronary artery calcifications.  diarrhea.  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  She received azithromycin x 3 days.  She received ciprofloxacin x 10 days without improvement.  She has seen ID in [place].  She began Bactrim on  03/04/2018 (discontinued on 03/11/2018) secondary to increase in creatine.   Chronic kidney disease Urinalysis on 11/15/2017 revealed revealed no hemoglobin, bilirubin or active sediment.   Liver function tests increased on 02/15/2019.  Work-up on 02/16/2019 revealed hepatitis B E antibody positive.  Negative studies included: hepatitis A total antibody, hepatitis A IgM, hepatitis B surface antigen, hepatitis B E antigen.  Hepatitis B DNA was not detected.  Folate was 80.5 and vitamin B12 was 443.  She has received her vaccinations:  She has completed the PCV13, PPSV23, and HiB.  She received Menvio (quadrivalent meningoccal vaccine) and Bexero (univalent serogroup B meningoccal vaccine) on 07/06/2019.  She was diagnosed with C difficile + diarrhea on 08/12/2020.  She completed a course of oral vancomycin.  Stool was negative for C. difficile on 08/27/2020.  11/17/2020 - 11/20/2020 [facility] admission with left L5 varicella zoster.  Lesion was positive for VZV. She was treated with ceftazidime and a valacyclovir.  She was discharged on 9 days of Valtrex 1 gm 3 times daily followed by 500 mg daily indefinitely for suppression and doxycycline.  History of discoid lupus  #Positive hepatitis B core antibody Previously on entecavir 0.5 mg daily, currently she has finished a course. Dose adjusted based on renal function. CrCl 30-49 ml/min   50% dosing (0.5 mg QOD) CrCl >= 50 ml/min 100% dosing (0.5 mg a day). With her kidney function currently QOD She has completed course of Entecavir and stopped 08/14/2021  # PCP prophylaxis Patient was on Septra DS on Mondays, Wednesdays, and Fridays, which was held due to fluctuated kidney functions.  Currently off Septra.  12/03/2021, CT chest lung cancer screening showed new right middle lobe pulmonary nodule with an aggressive appearance characterized as lung RADS 4 VS.  12/22/2021 PET showed No abnormal FDG avidity associated with the resolving right middle lobe  pulmonary nodule now with a 12 mm ground-glass opacity in the area of prior solid nodularity, most consistent with an infectious or inflammatory etiology  INTERVAL HISTORY Tina Page is a 67 y.o. female who has above history reviewed by me today presents for follow up visit  Patient remained on prednisone 5mg  daily.  Patient continues to have weight gain Chronic fatigue unchanged. UTI symptoms have resolved, she completed antibiotics.    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  11/23/2017   Post PTCA 07/12/2016   iliac stents bilaterally 2017   Thrombocytopenia (HCC) 10/04/2016   Wears dentures    full upper and lower    Past Surgical History:  Procedure Laterality Date   ELECTROMAGNETIC NAVIGATION BROCHOSCOPY N/A 12/07/2017   Procedure: ELECTROMAGNETIC NAVIGATION BRONCHOSCOPY;  Surgeon: Erin Fulling, MD;  Location: [facility] ORS;  Service: Cardiopulmonary;  Laterality: N/A;   ESOPHAGOGASTRODUODENOSCOPY (EGD) WITH PROPOFOL N/A 07/08/2016   Procedure: ESOPHAGOGASTRODUODENOSCOPY (EGD) WITH PROPOFOL;  Surgeon: Scot Jun, MD;  Location: [facility] ENDOSCOPY;  Service: Endoscopy;  Laterality: N/A;   KNEE SURGERY Right    repair of acl tear   PERIPHERAL VASCULAR CATHETERIZATION N/A 07/10/2016   Procedure: Lower Extremity Angiography;  Surgeon: Renford Dills, MD;  Location: [facility] INVASIVE CV LAB;  Service: Cardiovascular;  Laterality: N/A;   PERIPHERAL VASCULAR CATHETERIZATION N/A 07/10/2016    Procedure: Abdominal Aortogram w/Lower Extremity;  Surgeon: Renford Dills, MD;  Location: [facility] INVASIVE CV LAB;  Service: Cardiovascular;  Laterality: N/A;   PERIPHERAL VASCULAR CATHETERIZATION  07/10/2016   Procedure: Lower Extremity Intervention;  Surgeon: Renford Dills, MD;  Location: [facility] INVASIVE CV LAB;  Service: Cardiovascular;;    Family History  Problem Relation Age of Onset   Diabetes Mother    Hypertension Mother    Diabetes Maternal Grandfather    Hypertension Maternal Grandfather    Breast cancer Neg Hx     Social History:  reports that she quit smoking about 6 years ago. Her smoking use included cigarettes. She has a 58.75 pack-year smoking history. She has never used smokeless tobacco. She reports that she does not drink alcohol and does not use drugs.  Former smoker, 58-pack-year smoking history.   She has a daughter, Yehuda Mao and a daughter named [name].  She lives in [place]. The patient is alone today.  Allergies:  Allergies  Allergen Reactions   Ciprofloxacin Swelling    Facial swelling following single oral dose.    Codeine Anaphylaxis   Nsaids Other (See Comments)    Patient has Hep C.     Current Medications: Current Outpatient Medications  Medication Sig Dispense Refill   albuterol (VENTOLIN HFA) 108 (90 Base) MCG/ACT inhaler SMARTSIG:2 Puff(s) By Mouth Every 4 Hours PRN     allopurinol (ZYLOPRIM) 100 MG tablet TAKE 1 TABLET BY MOUTH EVERY DAY 30 tablet 5   Budeson-Glycopyrrol-Formoterol (BREZTRI AEROSPHERE) 160-9-4.8 MCG/ACT AERO Inhale 2 puffs into the lungs in the morning and at bedtime. 10.7 g 11   Budeson-Glycopyrrol-Formoterol (BREZTRI AEROSPHERE) 160-9-4.8 MCG/ACT AERO Inhale 2 puffs into the lungs in the morning and at bedtime. 11.8 g 0   CALCIUM PO Take by mouth daily at 12 noon.     clopidogrel (PLAVIX) 75 MG tablet Take 1 tablet (75 mg total) by mouth daily. 30 tablet 5   cyanocobalamin 500 MCG tablet Take 500 mcg by mouth daily.      hydroxychloroquine (PLAQUENIL) 200 MG tablet Take 200 mg by mouth 2 (two) times daily.     levothyroxine (SYNTHROID, LEVOTHROID) 75 MCG tablet Take 75 mcg by mouth daily before breakfast.      lisinopril-hydrochlorothiazide (ZESTORETIC) 20-25 MG tablet Take 1 tablet by mouth daily. 90 tablet 3   pantoprazole (PROTONIX) 40 MG tablet Take 1 tablet by mouth once daily 30 tablet 0   predniSONE (DELTASONE) 5 MG tablet Take 1 tablet (5 mg total) by mouth daily with breakfast. 90 tablet 1   rosuvastatin (CRESTOR) 40 MG tablet Take 1 tablet (40 mg  total) by mouth daily. 90 tablet 3   VITAMIN D PO Take by mouth daily in the afternoon.     cephALEXin (KEFLEX) 500 MG capsule Take 1 capsule (500 mg total) by mouth 3 (three) times daily. (Patient not taking: Reported on 02/08/2023) 21 capsule 0   entecavir (BARACLUDE) 0.5 MG tablet Take 0.5 mg by mouth every other day. (Patient not taking: Reported on 02/08/2023)     No current facility-administered medications for this visit.    Review of Systems  Constitutional:  Negative for appetite change, chills, fatigue and fever.  HENT:   Negative for hearing loss and voice change.   Eyes:  Negative for eye problems.  Respiratory:  Negative for chest tightness and cough.   Cardiovascular:  Negative for chest pain.  Gastrointestinal:  Negative for abdominal distention, abdominal pain and blood in stool.  Endocrine: Negative for hot flashes.  Genitourinary:  Negative for difficulty urinating and frequency.   Musculoskeletal:  Negative for arthralgias.  Skin:  Negative for itching and rash.  Neurological:  Negative for extremity weakness.  Hematological:  Negative for adenopathy.  Psychiatric/Behavioral:  Negative for confusion. The patient is not nervous/anxious.      Performance status (ECOG): 1  Vital Signs Blood pressure (!) 121/58, pulse 81, temperature (!) 95 F (35 C), temperature source Tympanic, resp. rate 18, weight 194 lb 1.6 oz (88 kg), SpO2 99  %.  Physical Exam Constitutional:      General: She is not in acute distress.    Appearance: She is obese. She is not diaphoretic.  HENT:     Head: Normocephalic and atraumatic.     Nose: Nose normal.     Mouth/Throat:     Pharynx: No oropharyngeal exudate.  Eyes:     General: No scleral icterus.    Pupils: Pupils are equal, round, and reactive to light.  Cardiovascular:     Rate and Rhythm: Normal rate and regular rhythm.     Heart sounds: No murmur heard. Pulmonary:     Effort: Pulmonary effort is normal. No respiratory distress.  Abdominal:     General: There is no distension.     Palpations: Abdomen is soft.     Tenderness: There is no abdominal tenderness.  Musculoskeletal:        General: Normal range of motion.     Cervical back: Normal range of motion and neck supple.  Skin:    General: Skin is warm and dry.     Findings: No erythema.  Neurological:     Mental Status: She is alert and oriented to person, place, and time. Mental status is at baseline.     Cranial Nerves: No cranial nerve deficit.     Motor: No abnormal muscle tone.  Psychiatric:        Mood and Affect: Mood and affect normal.      Laboratory findings     Latest Ref Rng & Units 02/08/2023    8:19 AM 01/18/2023    7:54 AM 01/09/2023    7:32 PM  CBC  WBC 4.0 - 10.5 K/uL 6.5  6.9  7.3   Hemoglobin 12.0 - 15.0 g/dL 9.6  9.8  9.5   Hematocrit 36.0 - 46.0 % 31.0  31.4  31.5   Platelets 150 - 400 K/uL 160  207  167       Latest Ref Rng & Units 02/08/2023    8:19 AM 01/26/2023    7:56 AM 01/18/2023    7:54  AM  CMP  Glucose 70 - 99 mg/dL 161  82  82   BUN 8 - 23 mg/dL 41  43  40   Creatinine 0.44 - 1.00 mg/dL 0.96  0.45  4.09   Sodium 135 - 145 mmol/L 141  140  140   Potassium 3.5 - 5.1 mmol/L 3.6  3.9  3.8   Chloride 98 - 111 mmol/L 106  103  104   CO2 22 - 32 mmol/L 29  28  27    Calcium 8.9 - 10.3 mg/dL 8.4  9.4  9.5   Total Protein 6.5 - 8.1 g/dL 7.1   7.3   Total Bilirubin 0.3 - 1.2 mg/dL  0.9   0.8   Alkaline Phos 38 - 126 U/L 42   41   AST 15 - 41 U/L 17   17   ALT 0 - 44 U/L 12   11

## 2023-02-08 NOTE — Assessment & Plan Note (Addendum)
History of hepatitis B previously on entecavir after her previous rituximab treatments.  Recommend patient to start Entecavir 0.5mg  For GFR >=30, take every other day For GFR <30, take every 72 hours.  She needs to start entecavir for 2 weeks prior to start of Rituximab.

## 2023-02-08 NOTE — Assessment & Plan Note (Signed)
Hb is stable.  monitor.

## 2023-02-08 NOTE — Assessment & Plan Note (Addendum)
LDH is normal, adequate B12 and folate level, normal iron panel.  There is increased reticulocyte %-5%.  Cold agglutinin titer negative. Bilirubin is normal. 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  12/22/21 Negative PET scan  05/25/22 Bone marrow biopsy did not reveal any lymphoproliferative diseases. Mild hypocellular marrow, erythroid hyperplasia, mild evidence of spherocytosis, consistent with hemolysis.  Labs are reviewed and discussed with patient. Continue prednisone 5 mg daily.  Hold off rituximab as she forgets to start entecavir 2 weeks ago.  Hemoglobin has been stable.

## 2023-02-10 ENCOUNTER — Encounter: Payer: Self-pay | Admitting: Oncology

## 2023-02-15 ENCOUNTER — Other Ambulatory Visit: Payer: BC Managed Care – PPO

## 2023-02-15 ENCOUNTER — Ambulatory Visit: Payer: BC Managed Care – PPO

## 2023-02-16 ENCOUNTER — Other Ambulatory Visit: Payer: Self-pay | Admitting: Pulmonary Disease

## 2023-02-22 ENCOUNTER — Ambulatory Visit: Payer: BC Managed Care – PPO

## 2023-02-22 ENCOUNTER — Inpatient Hospital Stay: Payer: BC Managed Care – PPO

## 2023-02-22 ENCOUNTER — Encounter: Payer: Self-pay | Admitting: Oncology

## 2023-02-22 ENCOUNTER — Inpatient Hospital Stay (HOSPITAL_BASED_OUTPATIENT_CLINIC_OR_DEPARTMENT_OTHER): Payer: BC Managed Care – PPO | Admitting: Oncology

## 2023-02-22 ENCOUNTER — Other Ambulatory Visit: Payer: BC Managed Care – PPO

## 2023-02-22 VITALS — BP 116/51 | HR 78 | Temp 96.2°F | Resp 16

## 2023-02-22 VITALS — BP 125/55 | HR 89 | Temp 96.8°F | Resp 18 | Wt 193.9 lb

## 2023-02-22 DIAGNOSIS — R768 Other specified abnormal immunological findings in serum: Secondary | ICD-10-CM

## 2023-02-22 DIAGNOSIS — D591 Autoimmune hemolytic anemia, unspecified: Secondary | ICD-10-CM

## 2023-02-22 DIAGNOSIS — D631 Anemia in chronic kidney disease: Secondary | ICD-10-CM

## 2023-02-22 DIAGNOSIS — N1832 Chronic kidney disease, stage 3b: Secondary | ICD-10-CM | POA: Diagnosis not present

## 2023-02-22 DIAGNOSIS — D5911 Warm autoimmune hemolytic anemia: Secondary | ICD-10-CM | POA: Diagnosis not present

## 2023-02-22 LAB — CMP (CANCER CENTER ONLY)
ALT: 13 U/L (ref 0–44)
AST: 19 U/L (ref 15–41)
Albumin: 4.1 g/dL (ref 3.5–5.0)
Alkaline Phosphatase: 42 U/L (ref 38–126)
Anion gap: 10 (ref 5–15)
BUN: 38 mg/dL — ABNORMAL HIGH (ref 8–23)
CO2: 27 mmol/L (ref 22–32)
Calcium: 9.2 mg/dL (ref 8.9–10.3)
Chloride: 105 mmol/L (ref 98–111)
Creatinine: 2.17 mg/dL — ABNORMAL HIGH (ref 0.44–1.00)
GFR, Estimated: 25 mL/min — ABNORMAL LOW (ref >60)
Glucose, Bld: 105 mg/dL — ABNORMAL HIGH (ref 70–99)
Potassium: 3.3 mmol/L — ABNORMAL LOW (ref 3.5–5.1)
Sodium: 142 mmol/L (ref 135–145)
Total Bilirubin: 1 mg/dL (ref 0.3–1.2)
Total Protein: 6.9 g/dL (ref 6.5–8.1)

## 2023-02-22 LAB — CBC (CANCER CENTER ONLY)
HCT: 29.3 % — ABNORMAL LOW (ref 36.0–46.0)
Hemoglobin: 9.2 g/dL — ABNORMAL LOW (ref 12.0–15.0)
MCH: 32.5 pg (ref 26.0–34.0)
MCHC: 31.4 g/dL (ref 30.0–36.0)
MCV: 103.5 fL — ABNORMAL HIGH (ref 80.0–100.0)
Platelet Count: 156 10*3/uL (ref 150–400)
RBC: 2.83 MIL/uL — ABNORMAL LOW (ref 3.87–5.11)
RDW: 15.9 % — ABNORMAL HIGH (ref 11.5–15.5)
WBC Count: 7 10*3/uL (ref 4.0–10.5)
nRBC: 0 % (ref 0.0–0.2)

## 2023-02-22 MED ORDER — DIPHENHYDRAMINE HCL 25 MG PO CAPS
50.0000 mg | ORAL_CAPSULE | Freq: Once | ORAL | Status: AC
  Administered 2023-02-22: 50 mg via ORAL
  Filled 2023-02-22: qty 2

## 2023-02-22 MED ORDER — ACETAMINOPHEN 325 MG PO TABS
650.0000 mg | ORAL_TABLET | Freq: Once | ORAL | Status: AC
  Administered 2023-02-22: 650 mg via ORAL
  Filled 2023-02-22: qty 2

## 2023-02-22 MED ORDER — SODIUM CHLORIDE 0.9 % IV SOLN
Freq: Once | INTRAVENOUS | Status: AC
  Filled 2023-02-22: qty 250

## 2023-02-22 MED ORDER — PREDNISONE 5 MG PO TABS: 5.0000 mg | ORAL_TABLET | Freq: Every day | ORAL | 0 refills | Status: DC

## 2023-02-22 MED ORDER — SODIUM CHLORIDE 0.9 % IV SOLN
375.0000 mg/m2 | Freq: Once | INTRAVENOUS | Status: AC
  Administered 2023-02-22: 700 mg via INTRAVENOUS
  Filled 2023-02-22: qty 50

## 2023-02-22 NOTE — Progress Notes (Signed)
Hematology/Oncology Progress note [phone] [phone]      Clinic Day:  02/22/2023  ASSESSMENT & PLAN:   Autoimmune hemolytic anemia (HCC) LDH is normal, adequate B12 and folate level, normal iron panel.  There is increased reticulocyte %-5%.  Cold agglutinin titer negative. Bilirubin is normal. 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  12/22/21 Negative PET scan  05/25/22 Bone marrow biopsy did not reveal any lymphoproliferative diseases. Mild hypocellular marrow, erythroid hyperplasia, mild evidence of spherocytosis, consistent with hemolysis.  Labs are reviewed and discussed with patient. Continue prednisone 5 mg daily.  proceed rituximab, plan weekly x 4   Hepatitis B core antibody positive History of hepatitis B previously on entecavir after her previous rituximab treatments.  Recommend patient to continue Entecavir 0.5mg  Per ID recommendation  for GFR >=30, take every other day For GFR <30, take every 72 hours.  She needs to start entecavir for 2 weeks prior to start of Rituximab.    CKD (chronic kidney disease) stage 3, GFR 30-59 ml/min (HCC) Encourage oral hydration.  Avoid nephrotoxins.    Anemia in chronic kidney disease (CKD) Hb is stable.  monitor.   No orders of the defined types were placed in this encounter.   Follow up  1 week rituximab 2 weeks lab MD Rituximab  All questions were answered. The patient knows to call the clinic with any problems, questions or concerns.  [name], MD, PhD [facility] Hematology Oncology 02/22/2023   Chief Complaint: Tina Page is a 67 y.o. 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  Reticulocyte count was 11.3%.  Ferritin was 562.  Iron studies revealed a saturation of 20% and a TIBC of 214 (low).  B12 was 231 (low normal).  Folate was 42.   Peripheral smear revealed rouleaux formation.    Additional testing included the following + studies:  hepatitis C antibody, hepatitis B core antibody, CMV IgM, and EBV VCA (IgM and IgG).  Hepatitis B by PCR was negative.  Hepatitis C RNA was negative.  Mycoplasma pneumonia IgM was negative.  Reticulocyte count was 11.3% (high) indicating appropriate marrow response.  C3 and C4 were normal.  Cold agglutinin titer was negative x 2.  Negative studies included:  ANA, hepatitis B surface antigen, SPEP, and free light chain ratio.   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  MMA was initially 330 (normal).  Repeat MMA was elevated on 08/01/2016 confirming B12 deficiency.  Hepatitis B surface antibody was positive and hepatitis B surface antigen was negative on 08/06/2016.   07/07/2016  Chest, abdomen, and pelvis CT angiogram on revealed moderate diffuse atherosclerotic vascular disease of the abdominal aorta with severe stenosis of the left common iliac artery with suspected short segment occlusion. There was no adenopathy.  Spleen was normal.   04/15/2017 Abd US revealed a normal spleen and sludge in the gallbladder.  The liver was echogenic consistent fatty infiltration and/or hepatocellular disease.  For hemolytic anemia treatments, in 2017, She has received multiple units of  warmed PRBCs to date.  08/01/2016-03/12/2017, steroid treatment with prednisone.  She is also on folic acid 1 mg daily. Borderline low vitamin B12 level on 07/09/2016.  Patient has been on oral vitamin B12 supplementation.  10/12/2017  positive warm autoantibody.  LDH and  bilirubin were normal.   She began prednisone 10 mg on 12/07/2017 (discontinued 03/2018).  She received Rituxan weekly x 4 (last dose 06/16/2018).  Entecavir was discontinued on 07/06/2019.  06/12/2020 developed recurrent anemia.  Hematocrit was 27.6, hemoglobin 8.4, MCV 104.5, platelets 138,000, WBC 3,400 (ANC 2,300). Creatinine was 1.17 (CrCl 50 ml/min).  Positive warm autoantibody  Reticulocyte count was 7.1%.  LDH was 204 (98-192).  06/16/2020, began prednisone 1 mg/kilogram treatments, 07/24/2020, prednisone was tapered down to 20 mg.  She received Rituxan 1000 mg on day 1 and 15 (07/15/2020 and 07/29/2020).  She began entecavir on 06/28/2020.  Prednisone was tapered off.  She was on Septra for PCP prophylaxis which is now currently on hold    Other medical problems 07/10/2016.  She underwent PTCA and stent placement in right and left common iliac arteries and left external iliac artery. She is on Plavix.   03/19/2015 EGD revealed gastritis in the body and antrum.  Colonoscopy revealed one hyperplastic polyp.  Repeat EGD on 07/08/2016 was normal.  No evidence of bleeding.  History of she developed peri-oral and intranasal herpes simplex-1.  She was treated with valacyclovir and doxycycline on 10/19/2016.   10/26/2017. She developed flu like symptoms.  Symptoms included cough, myalgias, and fever (tmax unknown).  She was prescribed Mucinex, Tamiflu, and amoxicillin.  She only took amoxicillin x 5 days.  She developed increased liver function tests on 11/02/2017. CMV IgG was positive.  EBV VCA IgG, NA IgG, early antigen antibody IgG were elevated.  EBV VCA IgM was < 36.  Testing was c/w a convalescence/past infection or reactivated infection.  LFTs normalized on 11/15/2017.  Smooth muscle antibody was 39 (high) on 11/10/2017 and 34 (high) on 11/30/2017.     Chest CT on 11/16/2017 revealed a 2.2 x 2.0 x 2.4 spiculated RIGHT middle lobe mass.  There was tiny nonspecific upper lobe pulmonary nodules  greater on RIGHT, largest 3 mm, of uncertain etiology.  There was additional 8 x 8 mm opacity in the posterior sulcus of the RIGHT lower lobe. PET scan on 11/22/2017 revealed a 2 cm hypermetabolic right middle lobe lung mass (SUV 3.4) consistent  with primary lung neoplasm.  There were no findings for mediastinal/hilar lymphadenopathy or metastatic disease.  There were areas of hypermetabolism involving the left oblique abdominal muscles and the anorectal junction. PFTs on 11/18/2017 revealed an FEV1 of 1.22 liters (51%).  DLCO adj was 6.8 mL/mmHg/min (32%).   ENB done on 12/07/2017. Cytology was negative for malignancy. Pathology demonstrated organizing pneumonia and chronic bronchitis. Pathologist commented that organizing pneumonia spanned about 2 mm in one fragment. Cases of focal organizing pneumonia with hypermetabolism on FDG-PET have been described, but changes of this type can also be adjacent to a neoplasm. Patient prescribed a daily dose of Prednisone 10 mg   07/24/2020  repeat  chest CT on  revealed Lung-RADS 2, benign appearance or behavior. There was emphysema, aortic atherosclerosis, and coronary artery calcifications.  diarrhea.  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  She received azithromycin x 3 days.  She received ciprofloxacin x 10 days without improvement.  She has seen ID in [place].  She began Bactrim on 03/04/2018 (discontinued on 03/11/2018) secondary to increase in creatine.   Chronic kidney disease Urinalysis  on 11/15/2017 revealed revealed no hemoglobin, bilirubin or active sediment.   Liver function tests increased on 02/15/2019.  Work-up on 02/16/2019 revealed hepatitis B E antibody positive.  Negative studies included: hepatitis A total antibody, hepatitis A IgM, hepatitis B surface antigen, hepatitis B E antigen.  Hepatitis B DNA was not detected.  Folate was 80.5 and vitamin B12 was 443.  She has received her vaccinations:  She has  completed the PCV13, PPSV23, and HiB.  She received Menvio (quadrivalent meningoccal vaccine) and Bexero (univalent serogroup B meningoccal vaccine) on 07/06/2019.  She was diagnosed with C difficile + diarrhea on 08/12/2020.  She completed a course of oral vancomycin.  Stool was negative for C. difficile on 08/27/2020.  11/17/2020 - 11/20/2020 [facility] admission with left L5 varicella zoster.  Lesion was positive for VZV. She was treated with ceftazidime and a valacyclovir.  She was discharged on 9 days of Valtrex 1 gm 3 times daily followed by 500 mg daily indefinitely for suppression and doxycycline.  History of discoid lupus  #Positive hepatitis B core antibody Previously on entecavir 0.5 mg daily, currently she has finished a course. Dose adjusted based on renal function. CrCl 30-49 ml/min   50% dosing (0.5 mg QOD) CrCl >= 50 ml/min 100% dosing (0.5 mg a day). With her kidney function currently QOD She has completed course of Entecavir and stopped 08/14/2021  # PCP prophylaxis Patient was on Septra DS on Mondays, Wednesdays, and Fridays, which was held due to fluctuated kidney functions.  Currently off Septra.  12/03/2021, CT chest lung cancer screening showed new right middle lobe pulmonary nodule with an aggressive appearance characterized as lung RADS 4 VS.  12/22/2021 PET showed No abnormal FDG avidity associated with the resolving right middle lobe pulmonary nodule now with a 12 mm ground-glass opacity in the area of prior solid nodularity, most consistent with an infectious or inflammatory etiology  INTERVAL HISTORY Tina Page is a 67 y.o. female who has above history reviewed by me today presents for follow up visit  Patient remained on prednisone 5mg  daily.  Patient continues to have weight gain Chronic fatigue unchanged. 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   Elevated liver function tests 11/03/2017   GERD (gastroesophageal reflux disease)    Heme positive stool 01/29/2015   Hepatitis C 07/12/2016   Ab positive, RNA negative   HLD (hyperlipidemia)    Hypertension    Hypothyroidism    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/23/2017   Post PTCA 07/12/2016   iliac stents bilaterally 2017   Thrombocytopenia (HCC) 10/04/2016   Wears dentures    full upper and lower    Past Surgical History:  Procedure Laterality Date   ELECTROMAGNETIC NAVIGATION BROCHOSCOPY N/A 12/07/2017   Procedure: ELECTROMAGNETIC NAVIGATION BRONCHOSCOPY;  Surgeon: Erin Fulling, MD;  Location: [facility] ORS;  Service: Cardiopulmonary;  Laterality: N/A;   ESOPHAGOGASTRODUODENOSCOPY (EGD) WITH PROPOFOL N/A 07/08/2016   Procedure: ESOPHAGOGASTRODUODENOSCOPY (EGD) WITH PROPOFOL;  Surgeon: Scot Jun, MD;  Location: [facility] ENDOSCOPY;  Service: Endoscopy;  Laterality: N/A;   KNEE SURGERY Right    repair of acl tear   PERIPHERAL VASCULAR CATHETERIZATION N/A 07/10/2016   Procedure: Lower Extremity Angiography;  Surgeon: Renford Dills, MD;  Location: [facility] INVASIVE CV LAB;  Service:  Cardiovascular;  Laterality: N/A;   PERIPHERAL VASCULAR CATHETERIZATION N/A 07/10/2016   Procedure: Abdominal Aortogram w/Lower Extremity;  Surgeon: Renford Dills, MD;  Location: [facility] INVASIVE CV LAB;  Service: Cardiovascular;  Laterality: N/A;   PERIPHERAL VASCULAR CATHETERIZATION  07/10/2016   Procedure: Lower Extremity Intervention;  Surgeon: Renford Dills, MD;  Location: [facility] INVASIVE CV LAB;  Service: Cardiovascular;;    Family History  Problem Relation Age of Onset   Diabetes Mother    Hypertension Mother    Diabetes Maternal Grandfather    Hypertension Maternal Grandfather    Breast cancer Neg Hx     Social History:  reports that she quit smoking about 6 years ago. Her  smoking use included cigarettes. She has a 58.75 pack-year smoking history. She has never used smokeless tobacco. She reports that she does not drink alcohol and does not use drugs.  Former smoker, 58-pack-year smoking history.   She has a daughter, Yehuda Mao and a daughter named [name].  She lives in [place]. The patient is alone today.  Allergies:  Allergies  Allergen Reactions   Ciprofloxacin Swelling    Facial swelling following single oral dose.    Codeine Anaphylaxis   Nsaids Other (See Comments)    Patient has Hep C.     Current Medications: Current Outpatient Medications  Medication Sig Dispense Refill   albuterol (VENTOLIN HFA) 108 (90 Base) MCG/ACT inhaler SMARTSIG:2 Puff(s) By Mouth Every 4 Hours PRN     allopurinol (ZYLOPRIM) 100 MG tablet TAKE 1 TABLET BY MOUTH EVERY DAY 30 tablet 5   Budeson-Glycopyrrol-Formoterol (BREZTRI AEROSPHERE) 160-9-4.8 MCG/ACT AERO Inhale 2 puffs into the lungs in the morning and at bedtime. 10.7 g 11   Budeson-Glycopyrrol-Formoterol (BREZTRI AEROSPHERE) 160-9-4.8 MCG/ACT AERO Inhale 2 puffs into the lungs in the morning and at bedtime. 11.8 g 0   CALCIUM PO Take by mouth daily at 12 noon.     clopidogrel (PLAVIX) 75 MG tablet Take 1 tablet (75 mg total) by mouth daily. 30 tablet 5   cyanocobalamin 500 MCG tablet Take 500 mcg by mouth daily.     hydroxychloroquine (PLAQUENIL) 200 MG tablet Take 200 mg by mouth 2 (two) times daily.     levothyroxine (SYNTHROID, LEVOTHROID) 75 MCG tablet Take 75 mcg by mouth daily before breakfast.      lisinopril-hydrochlorothiazide (ZESTORETIC) 20-25 MG tablet Take 1 tablet by mouth daily. 90 tablet 3   pantoprazole (PROTONIX) 40 MG tablet Take 1 tablet by mouth once daily 30 tablet 3   rosuvastatin (CRESTOR) 40 MG tablet Take 1 tablet (40 mg total) by mouth daily. 90 tablet 3   VITAMIN D PO Take by mouth daily in the afternoon.     cephALEXin (KEFLEX) 500 MG capsule Take 1 capsule (500 mg total) by mouth 3  (three) times daily. (Patient not taking: Reported on 02/08/2023) 21 capsule 0   entecavir (BARACLUDE) 0.5 MG tablet Take 0.5 mg by mouth every other day. (Patient not taking: Reported on 02/08/2023)     predniSONE (DELTASONE) 5 MG tablet Take 1 tablet (5 mg total) by mouth daily with breakfast. 30 tablet 0   No current facility-administered medications for this visit.    Review of Systems  Constitutional:  Negative for appetite change, chills, fatigue and fever.  HENT:   Negative for hearing loss and voice change.   Eyes:  Negative for eye problems.  Respiratory:  Negative for chest tightness and cough.   Cardiovascular:  Negative for chest pain.  Gastrointestinal:  Negative for abdominal distention, abdominal pain and blood in stool.  Endocrine: Negative for hot flashes.  Genitourinary:  Negative for difficulty urinating and frequency.   Musculoskeletal:  Negative for arthralgias.  Skin:  Negative for itching and rash.  Neurological:  Negative for extremity weakness.  Hematological:  Negative for adenopathy.  Psychiatric/Behavioral:  Negative for confusion. The patient is not nervous/anxious.      Performance status (ECOG): 1  Vital Signs Blood pressure (!) 125/55, pulse 89, temperature (!) 96.8 F (36 C), temperature source Tympanic, resp. rate 18, weight 193 lb 14.4 oz (88 kg), SpO2 97 %.  Physical Exam Constitutional:      General: She is not in acute distress.    Appearance: She is obese. She is not diaphoretic.  HENT:     Head: Normocephalic and atraumatic.     Nose: Nose normal.     Mouth/Throat:     Pharynx: No oropharyngeal exudate.  Eyes:     General: No scleral icterus.    Pupils: Pupils are equal, round, and reactive to light.  Cardiovascular:     Rate and Rhythm: Normal rate and regular rhythm.     Heart sounds: No murmur heard. Pulmonary:     Effort: Pulmonary effort is normal. No respiratory distress.  Abdominal:     General: There is no distension.      Palpations: Abdomen is soft.     Tenderness: There is no abdominal tenderness.  Musculoskeletal:        General: Normal range of motion.     Cervical back: Normal range of motion and neck supple.  Skin:    General: Skin is warm and dry.     Findings: No erythema.  Neurological:     Mental Status: She is alert and oriented to person, place, and time. Mental status is at baseline.     Cranial Nerves: No cranial nerve deficit.     Motor: No abnormal muscle tone.  Psychiatric:        Mood and Affect: Mood and affect normal.      Laboratory findings     Latest Ref Rng & Units 02/22/2023    8:21 AM 02/08/2023    8:19 AM 01/18/2023    7:54 AM  CBC  WBC 4.0 - 10.5 K/uL 7.0  6.5  6.9   Hemoglobin 12.0 - 15.0 g/dL 9.2  9.6  9.8   Hematocrit 36.0 - 46.0 % 29.3  31.0  31.4   Platelets 150 - 400 K/uL 156  160  207       Latest Ref Rng & Units 02/22/2023    8:21 AM 02/08/2023    8:19 AM 01/26/2023    7:56 AM  CMP  Glucose 70 - 99 mg/dL 914  782  82   BUN 8 - 23 mg/dL 38  41  43   Creatinine 0.44 - 1.00 mg/dL 9.56  2.13  0.86   Sodium 135 - 145 mmol/L 142  141  140   Potassium 3.5 - 5.1 mmol/L 3.3  3.6  3.9   Chloride 98 - 111 mmol/L 105  106  103   CO2 22 - 32 mmol/L 27  29  28    Calcium 8.9 - 10.3 mg/dL 9.2  8.4  9.4   Total Protein 6.5 - 8.1 g/dL 6.9  7.1    Total Bilirubin 0.3 - 1.2 mg/dL 1.0  0.9    Alkaline Phos 38 - 126 U/L 42  42    AST  15 - 41 U/L 19  17    ALT 0 - 44 U/L 13  12

## 2023-02-22 NOTE — Assessment & Plan Note (Addendum)
LDH is normal, adequate B12 and folate level, normal iron panel.  There is increased reticulocyte %-5%.  Cold agglutinin titer negative. Bilirubin is normal. 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  12/22/21 Negative PET scan  05/25/22 Bone marrow biopsy did not reveal any lymphoproliferative diseases. Mild hypocellular marrow, erythroid hyperplasia, mild evidence of spherocytosis, consistent with hemolysis.  Labs are reviewed and discussed with patient. Continue prednisone 5 mg daily.  proceed rituximab, plan weekly x 4

## 2023-02-22 NOTE — Assessment & Plan Note (Signed)
Hb is stable.  monitor.  

## 2023-02-22 NOTE — Assessment & Plan Note (Signed)
Encourage oral hydration. Avoid nephrotoxins. 

## 2023-02-22 NOTE — Patient Instructions (Signed)
[facility] [facility]  Discharge Instructions: Thank you for choosing [facility] to provide your oncology and hematology care.  If you have a lab appointment with the Cancer Center, please go directly to the Cancer Center and check in at the registration area.  Wear comfortable clothing and clothing appropriate for easy access to any Portacath or PICC line.   We strive to give you quality time with your provider. You may need to reschedule your appointment if you arrive late (15 or more minutes).  Arriving late affects you and other patients whose appointments are after yours.  Also, if you miss three or more appointments without notifying the office, you may be dismissed from the clinic at the provider's discretion.      For prescription refill requests, have your pharmacy contact our office and allow 72 hours for refills to be completed.    Today you received the following chemotherapy and/or immunotherapy agents: Rituxan.      To help prevent nausea and vomiting after your treatment, we encourage you to take your nausea medication as directed.  BELOW ARE SYMPTOMS THAT SHOULD BE REPORTED IMMEDIATELY: *FEVER GREATER THAN 100.4 F (38 C) OR HIGHER *CHILLS OR SWEATING *NAUSEA AND VOMITING THAT IS NOT CONTROLLED WITH YOUR NAUSEA MEDICATION *UNUSUAL SHORTNESS OF BREATH *UNUSUAL BRUISING OR BLEEDING *URINARY PROBLEMS (pain or burning when urinating, or frequent urination) *BOWEL PROBLEMS (unusual diarrhea, constipation, pain near the anus) TENDERNESS IN MOUTH AND THROAT WITH OR WITHOUT PRESENCE OF ULCERS (sore throat, sores in mouth, or a toothache) UNUSUAL RASH, SWELLING OR PAIN  UNUSUAL VAGINAL DISCHARGE OR ITCHING   Items with * indicate a potential emergency and should be followed up as soon as possible or go to the Emergency Department if any problems should occur.  Please show the CHEMOTHERAPY ALERT CARD or IMMUNOTHERAPY ALERT CARD at check-in to  the Emergency Department and triage nurse.  Should you have questions after your visit or need to cancel or reschedule your appointment, please contact [facility] [place] [facility]  [phone] and follow the prompts.  Office hours are 8:00 a.m. to 4:30 p.m. Monday - Friday. Please note that voicemails left after 4:00 p.m. may not be returned until the following business day.  We are closed weekends and major holidays. You have access to a nurse at all times for urgent questions. Please call the main number to the clinic [phone] and follow the prompts.  For any non-urgent questions, you may also contact your provider using MyChart. We now offer e-Visits for anyone 18 and older to request care online for non-urgent symptoms. For details visit [email or web address].   Also download the MyChart app! Go to the app store, search "MyChart", open the app, select [facility], and log in with your MyChart username and password.    

## 2023-02-22 NOTE — Assessment & Plan Note (Signed)
History of hepatitis B previously on entecavir after her previous rituximab treatments.  Recommend patient to continue Entecavir 0.5mg  Per ID recommendation  for GFR >=30, take every other day For GFR <30, take every 72 hours.  She needs to start entecavir for 2 weeks prior to start of Rituximab.

## 2023-03-01 ENCOUNTER — Other Ambulatory Visit: Payer: BC Managed Care – PPO

## 2023-03-01 ENCOUNTER — Inpatient Hospital Stay: Payer: BC Managed Care – PPO | Attending: Oncology

## 2023-03-01 ENCOUNTER — Other Ambulatory Visit: Payer: Self-pay

## 2023-03-01 ENCOUNTER — Ambulatory Visit: Payer: BC Managed Care – PPO

## 2023-03-01 ENCOUNTER — Inpatient Hospital Stay: Payer: BC Managed Care – PPO

## 2023-03-01 VITALS — BP 102/48 | HR 71 | Temp 96.5°F | Resp 16 | Ht 63.0 in | Wt 193.8 lb

## 2023-03-01 DIAGNOSIS — D5911 Warm autoimmune hemolytic anemia: Secondary | ICD-10-CM | POA: Insufficient documentation

## 2023-03-01 DIAGNOSIS — D591 Autoimmune hemolytic anemia, unspecified: Secondary | ICD-10-CM

## 2023-03-01 DIAGNOSIS — N1831 Chronic kidney disease, stage 3a: Secondary | ICD-10-CM | POA: Diagnosis present

## 2023-03-01 DIAGNOSIS — D631 Anemia in chronic kidney disease: Secondary | ICD-10-CM | POA: Diagnosis present

## 2023-03-01 DIAGNOSIS — Z7952 Long term (current) use of systemic steroids: Secondary | ICD-10-CM | POA: Insufficient documentation

## 2023-03-01 LAB — BASIC METABOLIC PANEL - CANCER CENTER ONLY
Anion gap: 7 (ref 5–15)
BUN: 41 mg/dL — ABNORMAL HIGH (ref 8–23)
CO2: 27 mmol/L (ref 22–32)
Calcium: 8.4 mg/dL — ABNORMAL LOW (ref 8.9–10.3)
Chloride: 105 mmol/L (ref 98–111)
Creatinine: 1.93 mg/dL — ABNORMAL HIGH (ref 0.44–1.00)
GFR, Estimated: 28 mL/min — ABNORMAL LOW (ref >60)
Glucose, Bld: 84 mg/dL (ref 70–99)
Potassium: 3.4 mmol/L — ABNORMAL LOW (ref 3.5–5.1)
Sodium: 139 mmol/L (ref 135–145)

## 2023-03-01 LAB — CBC WITH DIFFERENTIAL (CANCER CENTER ONLY)
Abs Immature Granulocytes: 0.03 10*3/uL (ref 0.00–0.07)
Band Neutrophils: 0 %
Basophils Absolute: 0.1 10*3/uL (ref 0.0–0.1)
Basophils Relative: 0 %
Blasts: 0 %
Eosinophils Absolute: 0.1 10*3/uL (ref 0.0–0.5)
Eosinophils Relative: 2 %
Lymphocytes Relative: 23 %
Lymphs Abs: 1.2 10*3/uL (ref 0.7–4.0)
Metamyelocytes Relative: 0 %
Monocytes Absolute: 0.4 10*3/uL (ref 0.1–1.0)
Monocytes Relative: 7 %
Myelocytes: 0 %
Neutro Abs: 3.5 10*3/uL (ref 1.7–7.7)
Neutrophils Relative %: 68 %
Other: 0 %
Promyelocytes Relative: 0 %
Smear Review: ADEQUATE
nRBC: 0 /100 WBC

## 2023-03-01 LAB — CBC (CANCER CENTER ONLY)
HCT: 29.2 % — ABNORMAL LOW (ref 36.0–46.0)
Hemoglobin: 9.2 g/dL — ABNORMAL LOW (ref 12.0–15.0)
MCH: 32.7 pg (ref 26.0–34.0)
MCHC: 31.5 g/dL (ref 30.0–36.0)
MCV: 103.9 fL — ABNORMAL HIGH (ref 80.0–100.0)
Platelet Count: 162 10*3/uL (ref 150–400)
RBC: 2.81 MIL/uL — ABNORMAL LOW (ref 3.87–5.11)
RDW: 15.9 % — ABNORMAL HIGH (ref 11.5–15.5)
WBC Count: 5.4 10*3/uL (ref 4.0–10.5)
nRBC: 0 % (ref 0.0–0.2)

## 2023-03-01 MED ORDER — DIPHENHYDRAMINE HCL 25 MG PO CAPS
50.0000 mg | ORAL_CAPSULE | Freq: Once | ORAL | Status: AC
  Administered 2023-03-01: 50 mg via ORAL
  Filled 2023-03-01: qty 2

## 2023-03-01 MED ORDER — SODIUM CHLORIDE 0.9 % IV SOLN
375.0000 mg/m2 | Freq: Once | INTRAVENOUS | Status: AC
  Administered 2023-03-01: 700 mg via INTRAVENOUS
  Filled 2023-03-01: qty 50

## 2023-03-01 MED ORDER — ACETAMINOPHEN 325 MG PO TABS
650.0000 mg | ORAL_TABLET | Freq: Once | ORAL | Status: AC
  Administered 2023-03-01: 650 mg via ORAL
  Filled 2023-03-01: qty 2

## 2023-03-01 MED ORDER — RITUXIMAB-PVVR CHEMO 500 MG/50ML IV SOLN
375.0000 mg/m2 | Freq: Once | INTRAVENOUS | Status: DC
Start: 2023-03-01 — End: 2023-03-01

## 2023-03-01 MED ORDER — SODIUM CHLORIDE 0.9 % IV SOLN
Freq: Once | INTRAVENOUS | Status: AC
  Filled 2023-03-01: qty 250

## 2023-03-02 ENCOUNTER — Other Ambulatory Visit: Payer: Self-pay | Admitting: Oncology

## 2023-03-08 ENCOUNTER — Inpatient Hospital Stay (HOSPITAL_BASED_OUTPATIENT_CLINIC_OR_DEPARTMENT_OTHER): Payer: BC Managed Care – PPO | Admitting: Oncology

## 2023-03-08 ENCOUNTER — Inpatient Hospital Stay: Payer: BC Managed Care – PPO

## 2023-03-08 ENCOUNTER — Encounter: Payer: Self-pay | Admitting: Oncology

## 2023-03-08 VITALS — BP 132/57 | HR 84 | Temp 95.3°F | Resp 18 | Wt 195.2 lb

## 2023-03-08 VITALS — BP 117/62 | HR 70

## 2023-03-08 DIAGNOSIS — D591 Autoimmune hemolytic anemia, unspecified: Secondary | ICD-10-CM

## 2023-03-08 DIAGNOSIS — D631 Anemia in chronic kidney disease: Secondary | ICD-10-CM

## 2023-03-08 DIAGNOSIS — D5911 Warm autoimmune hemolytic anemia: Secondary | ICD-10-CM | POA: Diagnosis not present

## 2023-03-08 DIAGNOSIS — R768 Other specified abnormal immunological findings in serum: Secondary | ICD-10-CM | POA: Diagnosis not present

## 2023-03-08 DIAGNOSIS — N1832 Chronic kidney disease, stage 3b: Secondary | ICD-10-CM

## 2023-03-08 LAB — BASIC METABOLIC PANEL - CANCER CENTER ONLY
Anion gap: 8 (ref 5–15)
BUN: 46 mg/dL — ABNORMAL HIGH (ref 8–23)
CO2: 26 mmol/L (ref 22–32)
Calcium: 9 mg/dL (ref 8.9–10.3)
Chloride: 106 mmol/L (ref 98–111)
Creatinine: 1.71 mg/dL — ABNORMAL HIGH (ref 0.44–1.00)
GFR, Estimated: 33 mL/min — ABNORMAL LOW (ref >60)
Glucose, Bld: 91 mg/dL (ref 70–99)
Potassium: 4.1 mmol/L (ref 3.5–5.1)
Sodium: 140 mmol/L (ref 135–145)

## 2023-03-08 LAB — CBC (CANCER CENTER ONLY)
HCT: 29.3 % — ABNORMAL LOW (ref 36.0–46.0)
Hemoglobin: 9.2 g/dL — ABNORMAL LOW (ref 12.0–15.0)
MCH: 32.7 pg (ref 26.0–34.0)
MCHC: 31.4 g/dL (ref 30.0–36.0)
MCV: 104.3 fL — ABNORMAL HIGH (ref 80.0–100.0)
Platelet Count: 150 10*3/uL (ref 150–400)
RBC: 2.81 MIL/uL — ABNORMAL LOW (ref 3.87–5.11)
RDW: 15.7 % — ABNORMAL HIGH (ref 11.5–15.5)
WBC Count: 5.6 10*3/uL (ref 4.0–10.5)
nRBC: 0 % (ref 0.0–0.2)

## 2023-03-08 MED ORDER — SODIUM CHLORIDE 0.9 % IV SOLN
Freq: Once | INTRAVENOUS | Status: AC
  Filled 2023-03-08: qty 250

## 2023-03-08 MED ORDER — SODIUM CHLORIDE 0.9 % IV SOLN: 375.0000 mg/m2 | Freq: Once | INTRAVENOUS | Status: DC

## 2023-03-08 MED ORDER — DIPHENHYDRAMINE HCL 25 MG PO CAPS
50.0000 mg | ORAL_CAPSULE | Freq: Once | ORAL | Status: AC
  Administered 2023-03-08: 50 mg via ORAL
  Filled 2023-03-08: qty 2

## 2023-03-08 MED ORDER — SODIUM CHLORIDE 0.9 % IV SOLN
375.0000 mg/m2 | Freq: Once | INTRAVENOUS | Status: AC
  Administered 2023-03-08: 700 mg via INTRAVENOUS
  Filled 2023-03-08: qty 70

## 2023-03-08 MED ORDER — ACETAMINOPHEN 325 MG PO TABS
650.0000 mg | ORAL_TABLET | Freq: Once | ORAL | Status: AC
  Administered 2023-03-08: 650 mg via ORAL
  Filled 2023-03-08: qty 2

## 2023-03-08 MED ORDER — EPOETIN ALFA 40000 UNIT/ML IJ SOLN: 30000.0000 [IU] | Freq: Once | INTRAMUSCULAR | Status: DC

## 2023-03-08 NOTE — Progress Notes (Signed)
Hematology/Oncology Progress note [phone] [phone]      Clinic Day:  03/08/2023  ASSESSMENT & PLAN:   Autoimmune hemolytic anemia (HCC) LDH is normal, adequate B12 and folate level, normal iron panel.  There is increased reticulocyte %-5%.  Cold agglutinin titer negative. Bilirubin is normal. 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  12/22/21 Negative PET scan  05/25/22 Bone marrow biopsy did not reveal any lymphoproliferative diseases. Mild hypocellular marrow, erythroid hyperplasia, mild evidence of spherocytosis, consistent with hemolysis.  Labs are reviewed and discussed with patient. Continue prednisone 5 mg daily.  proceed rituximab, plan total 4 doses.    Hepatitis B core antibody positive History of hepatitis B previously on entecavir after her previous rituximab treatments.  Recommend patient to continue Entecavir 0.5mg  Per ID recommendation  for GFR >=30, take every other day For GFR <30, take every 72 hours.  She needs to start entecavir for 2 weeks prior to start of Rituximab.    CKD (chronic kidney disease) stage 3, GFR 30-59 ml/min (HCC) Encourage oral hydration.  Avoid nephrotoxins.    Anemia in chronic kidney disease (CKD) Hb is stable, <10.  Recommend Epogen 30,000 units monthly if Hb <10  No orders of the defined types were placed in this encounter.   Follow up  1 week rituximab + Epogen 5 weeks lab MD + Epogen  All questions were answered. The patient knows to call the clinic with any problems, questions or concerns.  [name], MD, PhD [facility] Hematology Oncology 03/08/2023   Chief Complaint: Tina Page is a 67 y.o. 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  Reticulocyte count was 11.3%.  Ferritin was 562.  Iron studies revealed a saturation of 20% and a TIBC of 214 (low).  B12 was 231 (low normal).  Folate was 42.   Peripheral smear revealed rouleaux formation.    Additional testing included the following + studies:  hepatitis C antibody, hepatitis B core antibody, CMV IgM, and EBV VCA (IgM and IgG).  Hepatitis B by PCR was negative.  Hepatitis C RNA was negative.  Mycoplasma pneumonia IgM was negative.  Reticulocyte count was 11.3% (high) indicating appropriate marrow response.  C3 and C4 were normal.  Cold agglutinin titer was negative x 2.  Negative studies included:  ANA, hepatitis B surface antigen, SPEP, and free light chain ratio.   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  MMA was initially 330 (normal).  Repeat MMA was elevated on 08/01/2016 confirming B12 deficiency.  Hepatitis B surface antibody was positive and hepatitis B surface antigen was negative on 08/06/2016.   07/07/2016  Chest, abdomen, and pelvis CT angiogram on revealed moderate diffuse atherosclerotic vascular disease of the abdominal aorta with severe stenosis of the left common iliac artery with suspected short segment occlusion. There was no adenopathy.  Spleen was normal.   04/15/2017 Abd US revealed a normal spleen and sludge in the gallbladder.  The liver was echogenic consistent fatty infiltration and/or hepatocellular disease.  For hemolytic anemia treatments, in 2017, She has received multiple units of  warmed PRBCs to date.  08/01/2016-03/12/2017, steroid treatment with prednisone.  She is also on folic acid 1 mg daily. Borderline low vitamin B12 level on 07/09/2016.  Patient has been on oral vitamin B12  supplementation.  10/12/2017  positive warm autoantibody.  LDH and bilirubin were normal.   She began prednisone 10 mg on 12/07/2017 (discontinued 03/2018).  She received Rituxan weekly x 4 (last dose 06/16/2018).  Entecavir was discontinued on 07/06/2019.  06/12/2020 developed recurrent anemia.  Hematocrit was 27.6, hemoglobin 8.4, MCV 104.5, platelets 138,000, WBC 3,400 (ANC 2,300). Creatinine was 1.17 (CrCl 50 ml/min).  Positive warm autoantibody  Reticulocyte count was 7.1%.  LDH was 204 (98-192).  06/16/2020, began prednisone 1 mg/kilogram treatments, 07/24/2020, prednisone was tapered down to 20 mg.  She received Rituxan 1000 mg on day 1 and 15 (07/15/2020 and 07/29/2020).  She began entecavir on 06/28/2020.  Prednisone was tapered off.  She was on Septra for PCP prophylaxis which is now currently on hold    Other medical problems 07/10/2016.  She underwent PTCA and stent placement in right and left common iliac arteries and left external iliac artery. She is on Plavix.   03/19/2015 EGD revealed gastritis in the body and antrum.  Colonoscopy revealed one hyperplastic polyp.  Repeat EGD on 07/08/2016 was normal.  No evidence of bleeding.  History of she developed peri-oral and intranasal herpes simplex-1.  She was treated with valacyclovir and doxycycline on 10/19/2016.   10/26/2017. She developed flu like symptoms.  Symptoms included cough, myalgias, and fever (tmax unknown).  She was prescribed Mucinex, Tamiflu, and amoxicillin.  She only took amoxicillin x 5 days.  She developed increased liver function tests on 11/02/2017. CMV IgG was positive.  EBV VCA IgG, NA IgG, early antigen antibody IgG were elevated.  EBV VCA IgM was < 36.  Testing was c/w a convalescence/past infection or reactivated infection.  LFTs normalized on 11/15/2017.  Smooth muscle antibody was 39 (high) on 11/10/2017 and 34 (high) on 11/30/2017.     Chest CT on 11/16/2017 revealed a 2.2 x 2.0 x 2.4 spiculated RIGHT middle  lobe mass.  There was tiny nonspecific upper lobe pulmonary nodules greater on RIGHT, largest 3 mm, of uncertain etiology.  There was additional 8 x 8 mm opacity in the posterior sulcus of the RIGHT lower lobe. PET scan on 11/22/2017 revealed a 2 cm hypermetabolic right middle lobe lung mass (SUV 3.4) consistent  with primary lung neoplasm.  There were no findings for mediastinal/hilar lymphadenopathy or metastatic disease.  There were areas of hypermetabolism involving the left oblique abdominal muscles and the anorectal junction. PFTs on 11/18/2017 revealed an FEV1 of 1.22 liters (51%).  DLCO adj was 6.8 mL/mmHg/min (32%).   ENB done on 12/07/2017. Cytology was negative for malignancy. Pathology demonstrated organizing pneumonia and chronic bronchitis. Pathologist commented that organizing pneumonia spanned about 2 mm in one fragment. Cases of focal organizing pneumonia with hypermetabolism on FDG-PET have been described, but changes of this type can also be adjacent to a neoplasm. Patient prescribed a daily dose of Prednisone 10 mg   07/24/2020  repeat  chest CT on  revealed Lung-RADS 2, benign appearance or behavior. There was emphysema, aortic atherosclerosis, and coronary artery calcifications.  diarrhea.  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  She received azithromycin x 3 days.  She received ciprofloxacin x 10 days without improvement.  She has seen ID in [place].  She began Bactrim on 03/04/2018 (discontinued on 03/11/2018)  secondary to increase in creatine.   Chronic kidney disease Urinalysis on 11/15/2017 revealed revealed no hemoglobin, bilirubin or active sediment.   Liver function tests increased on 02/15/2019.  Work-up on 02/16/2019 revealed hepatitis B E antibody positive.  Negative studies included: hepatitis A total antibody, hepatitis A IgM, hepatitis B surface antigen, hepatitis B E antigen.  Hepatitis B DNA was not detected.  Folate was 80.5 and  vitamin B12 was 443.  She has received her vaccinations:  She has completed the PCV13, PPSV23, and HiB.  She received Menvio (quadrivalent meningoccal vaccine) and Bexero (univalent serogroup B meningoccal vaccine) on 07/06/2019.  She was diagnosed with C difficile + diarrhea on 08/12/2020.  She completed a course of oral vancomycin.  Stool was negative for C. difficile on 08/27/2020.  11/17/2020 - 11/20/2020 [facility] admission with left L5 varicella zoster.  Lesion was positive for VZV. She was treated with ceftazidime and a valacyclovir.  She was discharged on 9 days of Valtrex 1 gm 3 times daily followed by 500 mg daily indefinitely for suppression and doxycycline.  History of discoid lupus  #Positive hepatitis B core antibody Previously on entecavir 0.5 mg daily, currently she has finished a course. Dose adjusted based on renal function. CrCl 30-49 ml/min   50% dosing (0.5 mg QOD) CrCl >= 50 ml/min 100% dosing (0.5 mg a day). With her kidney function currently QOD She has completed course of Entecavir and stopped 08/14/2021  # PCP prophylaxis Patient was on Septra DS on Mondays, Wednesdays, and Fridays, which was held due to fluctuated kidney functions.  Currently off Septra.  12/03/2021, CT chest lung cancer screening showed new right middle lobe pulmonary nodule with an aggressive appearance characterized as lung RADS 4 VS.  12/22/2021 PET showed No abnormal FDG avidity associated with the resolving right middle lobe pulmonary nodule now with a 12 mm ground-glass opacity in the area of prior solid nodularity, most consistent with an infectious or inflammatory etiology  INTERVAL HISTORY Tina Page is a 67 y.o. female who has above history reviewed by me today presents for follow up visit  Patient remained on prednisone 5mg  daily.  Patient continues to have weight gain Chronic fatigue unchanged. She tolerates Rituximab treatments.   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/23/2017   Post PTCA 07/12/2016   iliac stents bilaterally 2017   Thrombocytopenia (HCC) 10/04/2016   Wears dentures    full upper and lower    Past Surgical History:  Procedure Laterality Date   ELECTROMAGNETIC NAVIGATION BROCHOSCOPY N/A 12/07/2017   Procedure: ELECTROMAGNETIC NAVIGATION BRONCHOSCOPY;  Surgeon: Erin Fulling, MD;  Location: [facility] ORS;  Service: Cardiopulmonary;  Laterality: N/A;   ESOPHAGOGASTRODUODENOSCOPY (EGD) WITH PROPOFOL N/A 07/08/2016   Procedure: ESOPHAGOGASTRODUODENOSCOPY (EGD) WITH PROPOFOL;  Surgeon: Scot Jun, MD;  Location[facility] ENDOSCOPY;  Service: Endoscopy;  Laterality: N/A;   KNEE SURGERY Right    repair of acl tear   PERIPHERAL VASCULAR CATHETERIZATION N/A 07/10/2016   Procedure: Lower Extremity  Angiography;  Surgeon: Renford Dills, MD;  Location: [facility] INVASIVE CV LAB;  Service: Cardiovascular;  Laterality: N/A;   PERIPHERAL VASCULAR CATHETERIZATION N/A 07/10/2016   Procedure: Abdominal Aortogram w/Lower Extremity;  Surgeon: Renford Dills, MD;  Location: [facility] INVASIVE CV LAB;  Service: Cardiovascular;  Laterality: N/A;   PERIPHERAL VASCULAR CATHETERIZATION  07/10/2016   Procedure: Lower Extremity Intervention;  Surgeon: Renford Dills, MD;  Location: [facility] INVASIVE CV LAB;  Service: Cardiovascular;;    Family History  Problem Relation Age of Onset   Diabetes Mother    Hypertension Mother    Diabetes Maternal Grandfather    Hypertension Maternal Grandfather     Breast cancer Neg Hx     Social History:  reports that she quit smoking about 6 years ago. Her smoking use included cigarettes. She has a 58.75 pack-year smoking history. She has never used smokeless tobacco. She reports that she does not drink alcohol and does not use drugs.  Former smoker, 58-pack-year smoking history.   She has a daughter, Yehuda Mao and a daughter named [name].  She lives in [place]. The patient is alone today.  Allergies:  Allergies  Allergen Reactions   Ciprofloxacin Swelling    Facial swelling following single oral dose.    Codeine Anaphylaxis   Nsaids Other (See Comments)    Patient has Hep C.     Current Medications: Current Outpatient Medications  Medication Sig Dispense Refill   albuterol (VENTOLIN HFA) 108 (90 Base) MCG/ACT inhaler SMARTSIG:2 Puff(s) By Mouth Every 4 Hours PRN     allopurinol (ZYLOPRIM) 100 MG tablet TAKE 1 TABLET BY MOUTH EVERY DAY 30 tablet 5   Budeson-Glycopyrrol-Formoterol (BREZTRI AEROSPHERE) 160-9-4.8 MCG/ACT AERO Inhale 2 puffs into the lungs in the morning and at bedtime. 10.7 g 11   Budeson-Glycopyrrol-Formoterol (BREZTRI AEROSPHERE) 160-9-4.8 MCG/ACT AERO Inhale 2 puffs into the lungs in the morning and at bedtime. 11.8 g 0   CALCIUM PO Take by mouth daily at 12 noon.     clopidogrel (PLAVIX) 75 MG tablet Take 1 tablet (75 mg total) by mouth daily. 30 tablet 5   cyanocobalamin 500 MCG tablet Take 500 mcg by mouth daily.     hydroxychloroquine (PLAQUENIL) 200 MG tablet Take 200 mg by mouth 2 (two) times daily.     levothyroxine (SYNTHROID, LEVOTHROID) 75 MCG tablet Take 75 mcg by mouth daily before breakfast.      lisinopril-hydrochlorothiazide (ZESTORETIC) 20-25 MG tablet Take 1 tablet by mouth daily. 90 tablet 3   pantoprazole (PROTONIX) 40 MG tablet Take 1 tablet by mouth once daily 30 tablet 3   predniSONE (DELTASONE) 5 MG tablet Take 1 tablet (5 mg total) by mouth daily with breakfast. 30 tablet 0   rosuvastatin (CRESTOR)  40 MG tablet Take 1 tablet (40 mg total) by mouth daily. 90 tablet 3   VITAMIN D PO Take by mouth daily in the afternoon.     cephALEXin (KEFLEX) 500 MG capsule Take 1 capsule (500 mg total) by mouth 3 (three) times daily. (Patient not taking: Reported on 02/08/2023) 21 capsule 0   entecavir (BARACLUDE) 0.5 MG tablet Take 0.5 mg by mouth every other day. (Patient not taking: Reported on 02/08/2023)     No current facility-administered medications for this visit.   Facility-Administered Medications Ordered in Other Visits  Medication Dose Route Frequency Provider Last Rate Last Admin   riTUXimab-pvvr (RUXIENCE) 700 mg in sodium chloride 0.9 % 180 mL infusion  375 mg/m2 (Treatment Plan Recorded) Intravenous Once  [name], MD        Review of Systems  Constitutional:  Negative for appetite change, chills, fatigue and fever.  HENT:   Negative for hearing loss and voice change.   Eyes:  Negative for eye problems.  Respiratory:  Negative for chest tightness and cough.   Cardiovascular:  Negative for chest pain.  Gastrointestinal:  Negative for abdominal distention, abdominal pain and blood in stool.  Endocrine: Negative for hot flashes.  Genitourinary:  Negative for difficulty urinating and frequency.   Musculoskeletal:  Negative for arthralgias.  Skin:  Negative for itching and rash.  Neurological:  Negative for extremity weakness.  Hematological:  Negative for adenopathy.  Psychiatric/Behavioral:  Negative for confusion. The patient is not nervous/anxious.      Performance status (ECOG): 1  Vital Signs Blood pressure (!) 132/57, pulse 84, temperature (!) 95.3 F (35.2 C), temperature source Tympanic, resp. rate 18, weight 195 lb 3.2 oz (88.5 kg), SpO2 97 %.  Physical Exam Constitutional:      General: She is not in acute distress.    Appearance: She is obese. She is not diaphoretic.  HENT:     Head: Normocephalic and atraumatic.     Nose: Nose normal.     Mouth/Throat:      Pharynx: No oropharyngeal exudate.  Eyes:     General: No scleral icterus.    Pupils: Pupils are equal, round, and reactive to light.  Cardiovascular:     Rate and Rhythm: Normal rate and regular rhythm.     Heart sounds: No murmur heard. Pulmonary:     Effort: Pulmonary effort is normal. No respiratory distress.  Abdominal:     General: There is no distension.     Palpations: Abdomen is soft.     Tenderness: There is no abdominal tenderness.  Musculoskeletal:        General: Normal range of motion.     Cervical back: Normal range of motion and neck supple.  Skin:    General: Skin is warm and dry.     Findings: No erythema.  Neurological:     Mental Status: She is alert and oriented to person, place, and time. Mental status is at baseline.     Cranial Nerves: No cranial nerve deficit.     Motor: No abnormal muscle tone.  Psychiatric:        Mood and Affect: Mood and affect normal.      Laboratory findings     Latest Ref Rng & Units 03/08/2023    8:31 AM 03/01/2023    9:22 AM 03/01/2023    8:27 AM  CBC  WBC 4.0 - 10.5 K/uL 5.6  DUPLICATE REQUEST  5.4   Hemoglobin 12.0 - 15.0 g/dL 9.2  DUPLICATE REQUEST  9.2   Hematocrit 36.0 - 46.0 % 29.3  DUPLICATE REQUEST  29.2   Platelets 150 - 400 K/uL 150  DUPLICATE REQUEST  162       Latest Ref Rng & Units 03/08/2023    8:31 AM 03/01/2023    8:27 AM 02/22/2023    8:21 AM  CMP  Glucose 70 - 99 mg/dL 91  84  454   BUN 8 - 23 mg/dL 46  41  38   Creatinine 0.44 - 1.00 mg/dL 0.98  1.19  1.47   Sodium 135 - 145 mmol/L 140  139  142   Potassium 3.5 - 5.1 mmol/L 4.1  3.4  3.3   Chloride 98 - 111 mmol/L 106  105  105   CO2 22 - 32 mmol/L 26  27  27    Calcium 8.9 - 10.3 mg/dL 9.0  8.4  9.2   Total Protein 6.5 - 8.1 g/dL   6.9   Total Bilirubin 0.3 - 1.2 mg/dL   1.0   Alkaline Phos 38 - 126 U/L   42   AST 15 - 41 U/L   19   ALT 0 - 44 U/L   13

## 2023-03-08 NOTE — Assessment & Plan Note (Addendum)
LDH is normal, adequate B12 and folate level, normal iron panel.  There is increased reticulocyte %-5%.  Cold agglutinin titer negative. Bilirubin is normal. 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  12/22/21 Negative PET scan  05/25/22 Bone marrow biopsy did not reveal any lymphoproliferative diseases. Mild hypocellular marrow, erythroid hyperplasia, mild evidence of spherocytosis, consistent with hemolysis.  Labs are reviewed and discussed with patient. Continue prednisone 5 mg daily.  proceed rituximab, plan total 4 doses.

## 2023-03-08 NOTE — Assessment & Plan Note (Addendum)
Hb is stable, <10.  Recommend Epogen 30,000 units monthly if Hb <10

## 2023-03-08 NOTE — Patient Instructions (Signed)
[facility]  Discharge Instructions: Thank you for choosing [facility] to provide your oncology and hematology care.  If you have a lab appointment with the Cancer Center, please go directly to the Cancer Center and check in at the registration area.  Wear comfortable clothing and clothing appropriate for easy access to any Portacath or PICC line.   We strive to give you quality time with your provider. You may need to reschedule your appointment if you arrive late (15 or more minutes).  Arriving late affects you and other patients whose appointments are after yours.  Also, if you miss three or more appointments without notifying the office, you may be dismissed from the clinic at the provider's discretion.      For prescription refill requests, have your pharmacy contact our office and allow 72 hours for refills to be completed.    Today you received the following chemotherapy and/or immunotherapy agents Rituximab      To help prevent nausea and vomiting after your treatment, we encourage you to take your nausea medication as directed.  BELOW ARE SYMPTOMS THAT SHOULD BE REPORTED IMMEDIATELY: *FEVER GREATER THAN 100.4 F (38 C) OR HIGHER *CHILLS OR SWEATING *NAUSEA AND VOMITING THAT IS NOT CONTROLLED WITH YOUR NAUSEA MEDICATION *UNUSUAL SHORTNESS OF BREATH *UNUSUAL BRUISING OR BLEEDING *URINARY PROBLEMS (pain or burning when urinating, or frequent urination) *BOWEL PROBLEMS (unusual diarrhea, constipation, pain near the anus) TENDERNESS IN MOUTH AND THROAT WITH OR WITHOUT PRESENCE OF ULCERS (sore throat, sores in mouth, or a toothache) UNUSUAL RASH, SWELLING OR PAIN  UNUSUAL VAGINAL DISCHARGE OR ITCHING   Items with * indicate a potential emergency and should be followed up as soon as possible or go to the Emergency Department if any problems should occur.  Please show the CHEMOTHERAPY ALERT CARD or IMMUNOTHERAPY ALERT CARD at check-in to  the Emergency Department and triage nurse.  Should you have questions after your visit or need to cancel or reschedule your appointment, please contact [facility]  [phone] and follow the prompts.  Office hours are 8:00 a.m. to 4:30 p.m. Monday - Friday. Please note that voicemails left after 4:00 p.m. may not be returned until the following business day.  We are closed weekends and major holidays. You have access to a nurse at all times for urgent questions. Please call the main number to the clinic [phone] and follow the prompts.  For any non-urgent questions, you may also contact your provider using MyChart. We now offer e-Visits for anyone 18 and older to request care online for non-urgent symptoms. For details visit [email or web address].   Also download the MyChart app! Go to the app store, search "MyChart", open the app, select [facility], and log in with your MyChart username and password.    

## 2023-03-08 NOTE — Assessment & Plan Note (Signed)
Encourage oral hydration. Avoid nephrotoxins. 

## 2023-03-08 NOTE — Assessment & Plan Note (Signed)
History of hepatitis B previously on entecavir after her previous rituximab treatments.  Recommend patient to continue Entecavir 0.5mg Per ID recommendation  for GFR >=30, take every other day For GFR <30, take every 72 hours.  She needs to start entecavir for 2 weeks prior to start of Rituximab.   

## 2023-03-09 ENCOUNTER — Ambulatory Visit
Admission: RE | Admit: 2023-03-09 | Discharge: 2023-03-09 | Disposition: A | Discharge status: Home / Self Care | Payer: BC Managed Care – PPO | Source: Ambulatory Visit | Attending: Acute Care | Admitting: Acute Care

## 2023-03-09 DIAGNOSIS — Z87891 Personal history of nicotine dependence: Secondary | ICD-10-CM

## 2023-03-09 DIAGNOSIS — R911 Solitary pulmonary nodule: Secondary | ICD-10-CM

## 2023-03-15 ENCOUNTER — Inpatient Hospital Stay: Payer: BC Managed Care – PPO

## 2023-03-15 VITALS — BP 120/58 | HR 70 | Temp 96.1°F | Resp 19 | Wt 192.9 lb

## 2023-03-15 DIAGNOSIS — D5911 Warm autoimmune hemolytic anemia: Secondary | ICD-10-CM | POA: Diagnosis not present

## 2023-03-15 DIAGNOSIS — D591 Autoimmune hemolytic anemia, unspecified: Secondary | ICD-10-CM

## 2023-03-15 LAB — CBC (CANCER CENTER ONLY)
HCT: 29.6 % — ABNORMAL LOW (ref 36.0–46.0)
Hemoglobin: 9.3 g/dL — ABNORMAL LOW (ref 12.0–15.0)
MCH: 32.7 pg (ref 26.0–34.0)
MCHC: 31.4 g/dL (ref 30.0–36.0)
MCV: 104.2 fL — ABNORMAL HIGH (ref 80.0–100.0)
Platelet Count: 162 10*3/uL (ref 150–400)
RBC: 2.84 MIL/uL — ABNORMAL LOW (ref 3.87–5.11)
RDW: 16.1 % — ABNORMAL HIGH (ref 11.5–15.5)
WBC Count: 5.4 10*3/uL (ref 4.0–10.5)
nRBC: 0 % (ref 0.0–0.2)

## 2023-03-15 LAB — BASIC METABOLIC PANEL - CANCER CENTER ONLY
Anion gap: 10 (ref 5–15)
BUN: 43 mg/dL — ABNORMAL HIGH (ref 8–23)
CO2: 26 mmol/L (ref 22–32)
Calcium: 9.8 mg/dL (ref 8.9–10.3)
Chloride: 104 mmol/L (ref 98–111)
Creatinine: 2.07 mg/dL — ABNORMAL HIGH (ref 0.44–1.00)
GFR, Estimated: 26 mL/min — ABNORMAL LOW (ref >60)
Glucose, Bld: 98 mg/dL (ref 70–99)
Potassium: 3.7 mmol/L (ref 3.5–5.1)
Sodium: 140 mmol/L (ref 135–145)

## 2023-03-15 MED ORDER — DIPHENHYDRAMINE HCL 25 MG PO CAPS
50.0000 mg | ORAL_CAPSULE | Freq: Once | ORAL | Status: AC
  Administered 2023-03-15: 50 mg via ORAL
  Filled 2023-03-15: qty 2

## 2023-03-15 MED ORDER — SODIUM CHLORIDE 0.9 % IV SOLN
375.0000 mg/m2 | Freq: Once | INTRAVENOUS | Status: AC
  Administered 2023-03-15: 700 mg via INTRAVENOUS
  Filled 2023-03-15: qty 50

## 2023-03-15 MED ORDER — ACETAMINOPHEN 325 MG PO TABS
650.0000 mg | ORAL_TABLET | Freq: Once | ORAL | Status: AC
  Administered 2023-03-15: 650 mg via ORAL
  Filled 2023-03-15: qty 2

## 2023-03-15 MED ORDER — SODIUM CHLORIDE 0.9 % IV SOLN
Freq: Once | INTRAVENOUS | Status: AC
  Filled 2023-03-15: qty 250

## 2023-03-15 NOTE — Patient Instructions (Signed)
[place] [facility] AT [facility]ION[facility]  Discharge Instructions: Thank you for choosing [facility] to provide your oncology and hematology care.  If you have a lab appointment with the Cancer Center, please go directly to the Cancer Center and check in at the registration area.  Wear comfortable clothing and clothing appropriate for easy access to any Portacath or PICC line.   We strive to give you quality time with your provider. You may need to reschedule your appointment if you arrive late (15 or more minutes).  Arriving late affects you and other patients whose appointments are after yours.  Also, if you miss three or more appointments without notifying the office, you may be dismissed from the clinic at the provider's discretion.      For prescription refill requests, have your pharmacy contact our office and allow 72 hours for refills to be completed.    Today you received the following chemotherapy and/or immunotherapy agents rituximab    To help prevent nausea and vomiting after your treatment, we encourage you to take your nausea medication as directed.  BELOW ARE SYMPTOMS THAT SHOULD BE REPORTED IMMEDIATELY: *FEVER GREATER THAN 100.4 F (38 C) OR HIGHER *CHILLS OR SWEATING *NAUSEA AND VOMITING THAT IS NOT CONTROLLED WITH YOUR NAUSEA MEDICATION *UNUSUAL SHORTNESS OF BREATH *UNUSUAL BRUISING OR BLEEDING *URINARY PROBLEMS (pain or burning when urinating, or frequent urination) *BOWEL PROBLEMS (unusual diarrhea, constipation, pain near the anus) TENDERNESS IN MOUTH AND THROAT WITH OR WITHOUT PRESENCE OF ULCERS (sore throat, sores in mouth, or a toothache) UNUSUAL RASH, SWELLING OR PAIN  UNUSUAL VAGINAL DISCHARGE OR ITCHING   Items with * indicate a potential emergency and should be followed up as soon as possible or go to the Emergency Department if any problems should occur.  Please show the CHEMOTHERAPY ALERT CARD or IMMUNOTHERAPY ALERT CARD at check-in to  the Emergency Department and triage nurse.  Should you have questions after your visit or need to cancel or reschedule your appointment, please contact [place] [facility]  [phone] and follow the prompts.  Office hours are 8:00 a.m. to 4:30 p.m. Monday - Friday. Please note that voicemails left after 4:00 p.m. may not be returned until the following business day.  We are closed weekends and major holidays. You have access to a nurse at all times for urgent questions. Please call the main number to the clinic [phone] and follow the prompts.  For any non-urgent questions, you may also contact your provider using MyChart. We now offer e-Visits for anyone 58 and older to request care online for non-urgent symptoms. For details visit [email or web address].   Also download the MyChart app! Go to the app store, search "MyChart", open the app, select [place], and log in with your MyChart username and password.

## 2023-03-18 ENCOUNTER — Telehealth: Payer: Self-pay | Admitting: Acute Care

## 2023-03-18 NOTE — Telephone Encounter (Signed)
Patient is returning phone call. Patient phone number is [phone].

## 2023-03-18 NOTE — Telephone Encounter (Signed)
I have attempted to call the patient with the results of her low dose Ct Chest.There was no  answer. I have left a HIPAA Compliant message with out contact information on her VBM, and I have asked her to return the call.  Her scan was read as a 4 A. She has a 7.2 mm anterior right lower lobe pulmonary nodule that has been growing over the last 6 months. In January 2024 the nodule was 5.2 mm, in  April 2022 it was 6.1 mm and now in July 2024 it is 7.2 mm. I have reviewed the scan results with Dr. Tonia Brooms. We will do PFT's and if she looks like a surgical candidate we will refer to surgery to see if she could possibly do dye mark and single anesthesia for surgery with Dr. Cliffton Asters.  Tina Page, Let me know if patient returns my call. I will order PFT's after I speak with her and we will schedule a follow up video visit to review the results. Thanks so much

## 2023-03-19 ENCOUNTER — Telehealth: Payer: Self-pay | Admitting: Acute Care

## 2023-03-19 DIAGNOSIS — R911 Solitary pulmonary nodule: Secondary | ICD-10-CM

## 2023-03-19 DIAGNOSIS — Z87891 Personal history of nicotine dependence: Secondary | ICD-10-CM

## 2023-03-19 NOTE — Telephone Encounter (Signed)
Appt scheduled for 05/25/2024 at 10:00 with Dr. Jayme Cloud- detailed message left for patient.

## 2023-03-19 NOTE — Telephone Encounter (Signed)
See telephone note from 03/19/23

## 2023-03-19 NOTE — Telephone Encounter (Signed)
Per Kandice Robinsons, NP pt will need a 3 month nodule follow up CT and not a 6 month follow up CT. Results/ plans faxed to PCP. Order placed for 3 month nodule follow up CT.

## 2023-03-19 NOTE — Telephone Encounter (Addendum)
I have called the patient with the results of her low dose Ct Chest. There has been slow growth of the anterior right lower lobe nodule. This nodule has been slowly growing over 3 scans. It is 7.2 mm now, was 6.1 mm in April 2024 and and before that 5.2 mm in 08/2022. Plan will be for 6 month follow up after speaking with Dr. Jayme Cloud, as patient has FEV1 was only 36 % and is not  surgical candidate. I will have her follow up with Dr. Jayme Cloud in the [place] office to be evaluated. Pt. Is in agreement with this plan. The nodule is still to small to PET. SBRT may be a good option . She would benefit from blood work to check cancer markers. Angelique Blonder, please fax results to PCP and let them know plan for follow up. Thanks so much.Place order for 6 month follow up CT Chest.

## 2023-03-22 NOTE — Telephone Encounter (Signed)
Lm x2 for patient. Mychart message sent. Patient last logged on 07/19. Will close encounter per office protocol.

## 2023-04-05 ENCOUNTER — Ambulatory Visit: Payer: BC Managed Care – PPO | Admitting: Oncology

## 2023-04-05 ENCOUNTER — Other Ambulatory Visit: Payer: BC Managed Care – PPO

## 2023-04-05 ENCOUNTER — Ambulatory Visit: Payer: BC Managed Care – PPO

## 2023-04-12 ENCOUNTER — Inpatient Hospital Stay (HOSPITAL_BASED_OUTPATIENT_CLINIC_OR_DEPARTMENT_OTHER): Payer: Medicare HMO | Admitting: Oncology

## 2023-04-12 ENCOUNTER — Inpatient Hospital Stay: Payer: Medicare HMO

## 2023-04-12 ENCOUNTER — Inpatient Hospital Stay: Payer: Medicare HMO | Attending: Oncology

## 2023-04-12 ENCOUNTER — Encounter: Payer: Self-pay | Admitting: Oncology

## 2023-04-12 ENCOUNTER — Other Ambulatory Visit: Payer: Self-pay

## 2023-04-12 VITALS — BP 126/53 | HR 92 | Temp 96.0°F | Resp 18 | Wt 194.6 lb

## 2023-04-12 DIAGNOSIS — N1832 Chronic kidney disease, stage 3b: Secondary | ICD-10-CM | POA: Diagnosis not present

## 2023-04-12 DIAGNOSIS — N1831 Chronic kidney disease, stage 3a: Secondary | ICD-10-CM | POA: Diagnosis present

## 2023-04-12 DIAGNOSIS — R768 Other specified abnormal immunological findings in serum: Secondary | ICD-10-CM | POA: Diagnosis not present

## 2023-04-12 DIAGNOSIS — D5911 Warm autoimmune hemolytic anemia: Secondary | ICD-10-CM | POA: Insufficient documentation

## 2023-04-12 DIAGNOSIS — D631 Anemia in chronic kidney disease: Secondary | ICD-10-CM

## 2023-04-12 DIAGNOSIS — D591 Autoimmune hemolytic anemia, unspecified: Secondary | ICD-10-CM

## 2023-04-12 DIAGNOSIS — Z7952 Long term (current) use of systemic steroids: Secondary | ICD-10-CM | POA: Diagnosis not present

## 2023-04-12 DIAGNOSIS — R7689 Other specified abnormal immunological findings in serum: Secondary | ICD-10-CM

## 2023-04-12 LAB — CMP (CANCER CENTER ONLY)
ALT: 13 U/L (ref 0–44)
AST: 15 U/L (ref 15–41)
Albumin: 4.3 g/dL (ref 3.5–5.0)
Alkaline Phosphatase: 47 U/L (ref 38–126)
Anion gap: 9 (ref 5–15)
BUN: 50 mg/dL — ABNORMAL HIGH (ref 8–23)
CO2: 24 mmol/L (ref 22–32)
Calcium: 9.2 mg/dL (ref 8.9–10.3)
Chloride: 105 mmol/L (ref 98–111)
Creatinine: 1.95 mg/dL — ABNORMAL HIGH (ref 0.44–1.00)
GFR, Estimated: 28 mL/min — ABNORMAL LOW (ref >60)
Glucose, Bld: 99 mg/dL (ref 70–99)
Potassium: 4 mmol/L (ref 3.5–5.1)
Sodium: 138 mmol/L (ref 135–145)
Total Bilirubin: 0.8 mg/dL (ref 0.3–1.2)
Total Protein: 7 g/dL (ref 6.5–8.1)

## 2023-04-12 LAB — CBC WITH DIFFERENTIAL (CANCER CENTER ONLY)
Abs Immature Granulocytes: 0.03 10*3/uL (ref 0.00–0.07)
Basophils Absolute: 0.1 10*3/uL (ref 0.0–0.1)
Basophils Relative: 1 %
Eosinophils Absolute: 0.1 10*3/uL (ref 0.0–0.5)
Eosinophils Relative: 2 %
HCT: 29.3 % — ABNORMAL LOW (ref 36.0–46.0)
Hemoglobin: 9 g/dL — ABNORMAL LOW (ref 12.0–15.0)
Immature Granulocytes: 0 %
Lymphocytes Relative: 11 %
Lymphs Abs: 0.9 10*3/uL (ref 0.7–4.0)
MCH: 32.6 pg (ref 26.0–34.0)
MCHC: 30.7 g/dL (ref 30.0–36.0)
MCV: 106.2 fL — ABNORMAL HIGH (ref 80.0–100.0)
Monocytes Absolute: 0.6 10*3/uL (ref 0.1–1.0)
Monocytes Relative: 8 %
Neutro Abs: 6.2 10*3/uL (ref 1.7–7.7)
Neutrophils Relative %: 78 %
Platelet Count: 163 10*3/uL (ref 150–400)
RBC: 2.76 MIL/uL — ABNORMAL LOW (ref 3.87–5.11)
RDW: 15.4 % (ref 11.5–15.5)
WBC Count: 8 10*3/uL (ref 4.0–10.5)
nRBC: 0 % (ref 0.0–0.2)

## 2023-04-12 MED ORDER — EPOETIN ALFA 10000 UNIT/ML IJ SOLN
10000.0000 [IU] | Freq: Once | INTRAMUSCULAR | Status: AC
  Administered 2023-04-12: 10000 [IU] via INTRAVENOUS
  Filled 2023-04-12: qty 1

## 2023-04-12 MED ORDER — PREDNISONE 5 MG PO TABS: 5.0000 mg | ORAL_TABLET | Freq: Every day | ORAL | 0 refills | Status: DC

## 2023-04-12 MED ORDER — EPOETIN ALFA 40000 UNIT/ML IJ SOLN: 30000.0000 [IU] | Freq: Once | INTRAMUSCULAR | Status: DC

## 2023-04-12 MED ORDER — EPOETIN ALFA 20000 UNIT/ML IJ SOLN
20000.0000 [IU] | Freq: Once | INTRAMUSCULAR | Status: AC
  Administered 2023-04-12: 20000 [IU] via INTRAVENOUS
  Filled 2023-04-12: qty 1

## 2023-04-12 NOTE — Progress Notes (Signed)
Hematology/Oncology Progress note [phone] [phone]      Clinic Day:  04/12/2023  ASSESSMENT & PLAN:   Autoimmune hemolytic anemia (HCC) LDH is normal, adequate B12 and folate level, normal iron panel.  There is increased reticulocyte %-5%.  Cold agglutinin titer negative. Bilirubin is normal. 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  12/22/21 Negative PET scan  05/25/22 Bone marrow biopsy did not reveal any lymphoproliferative diseases. Mild hypocellular marrow, erythroid hyperplasia, mild evidence of spherocytosis, consistent with hemolysis.  Labs are reviewed and discussed with patient. Continue prednisone 5 mg daily.  S/p Rituximab weekly x 4   Hepatitis B core antibody positive History of hepatitis B previously on entecavir after her previous rituximab treatments.  Recommend patient to continue Entecavir 0.5mg  Per ID recommendation  for GFR >=30, take every other day For GFR <30, take every 72 hours.     CKD (chronic kidney disease) stage 3, GFR 30-59 ml/min (HCC) Encourage oral hydration.  Avoid nephrotoxins.    Anemia in chronic kidney disease (CKD) Hb is stable, <10.  Recommend Epogen 30,000 units monthly if Hb <10  Orders Placed This Encounter  Procedures   CBC with Differential (Cancer Center Only)    Standing Status:   Future    Standing Expiration Date:   04/11/2024   CMP (Cancer Center only)    Standing Status:   Future    Standing Expiration Date:   04/11/2024    Follow up  4 weeks lab MD + Epogen  All questions were answered. The patient knows to call the clinic with any problems, questions or concerns.  [name], MD, PhD 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  Reticulocyte count was 11.3%.  Ferritin was 562.  Iron studies revealed a saturation of 20% and a TIBC of 214 (low).  B12 was 231 (low normal).  Folate was 42.   Peripheral smear revealed rouleaux formation.    Additional testing included the following + studies:  hepatitis C antibody, hepatitis B core antibody, CMV IgM, and EBV VCA (IgM and IgG).  Hepatitis B by PCR was negative.  Hepatitis C RNA was negative.  Mycoplasma pneumonia IgM was negative.  Reticulocyte count was 11.3% (high) indicating appropriate marrow response.  C3 and C4 were normal.  Cold agglutinin titer was negative x 2.  Negative studies included:  ANA, hepatitis B surface antigen, SPEP, and free light chain ratio.   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  MMA was initially 330 (normal).  Repeat MMA was elevated on 08/01/2016 confirming B12 deficiency.  Hepatitis B surface antibody was positive and hepatitis B surface antigen was negative on 08/06/2016.   07/07/2016  Chest, abdomen, and pelvis CT angiogram on revealed moderate diffuse atherosclerotic vascular disease of the abdominal aorta with severe stenosis of the left common iliac artery with suspected short segment occlusion. There was no adenopathy.  Spleen was normal.   04/15/2017 Abd US revealed a normal spleen and sludge in the gallbladder.  The liver was echogenic consistent fatty infiltration and/or hepatocellular disease.  For hemolytic anemia treatments, in 2017, She has received multiple units of  warmed PRBCs to date.  08/01/2016-03/12/2017, steroid treatment with prednisone.  She is also on folic acid 1 mg daily. Borderline low  vitamin B12 level on 07/09/2016.  Patient has been on oral vitamin B12 supplementation.  10/12/2017  positive warm autoantibody.  LDH and bilirubin were normal.   She began prednisone 10 mg on 12/07/2017 (discontinued 03/2018).  She received Rituxan weekly x 4 (last dose 06/16/2018).  Entecavir was discontinued on 07/06/2019.  06/12/2020 developed recurrent anemia.  Hematocrit was 27.6, hemoglobin 8.4, MCV 104.5, platelets 138,000, WBC 3,400 (ANC 2,300). Creatinine was 1.17 (CrCl 50 ml/min).  Positive warm autoantibody  Reticulocyte count was 7.1%.  LDH was 204 (98-192).  06/16/2020, began prednisone 1 mg/kilogram treatments, 07/24/2020, prednisone was tapered down to 20 mg.  She received Rituxan 1000 mg on day 1 and 15 (07/15/2020 and 07/29/2020).  She began entecavir on 06/28/2020.  Prednisone was tapered off.  She was on Septra for PCP prophylaxis which is now currently on hold    Other medical problems 07/10/2016.  She underwent PTCA and stent placement in right and left common iliac arteries and left external iliac artery. She is on Plavix.   03/19/2015 EGD revealed gastritis in the body and antrum.  Colonoscopy revealed one hyperplastic polyp.  Repeat EGD on 07/08/2016 was normal.  No evidence of bleeding.  History of she developed peri-oral and intranasal herpes simplex-1.  She was treated with valacyclovir and doxycycline on 10/19/2016.   10/26/2017. She developed flu like symptoms.  Symptoms included cough, myalgias, and fever (tmax unknown).  She was prescribed Mucinex, Tamiflu, and amoxicillin.  She only took amoxicillin x 5 days.  She developed increased liver function tests on 11/02/2017. CMV IgG was positive.  EBV VCA IgG, NA IgG, early antigen antibody IgG were elevated.  EBV VCA IgM was < 36.  Testing was c/w a convalescence/past infection or reactivated infection.  LFTs normalized on 11/15/2017.  Smooth muscle antibody was 39 (high) on 11/10/2017 and 34 (high) on 11/30/2017.      Chest CT on 11/16/2017 revealed a 2.2 x 2.0 x 2.4 spiculated RIGHT middle lobe mass.  There was tiny nonspecific upper lobe pulmonary nodules greater on RIGHT, largest 3 mm, of uncertain etiology.  There was additional 8 x 8 mm opacity in the posterior sulcus of the RIGHT lower lobe. PET scan on 11/22/2017 revealed a 2 cm hypermetabolic right middle lobe lung mass (SUV 3.4) consistent  with primary lung neoplasm.  There were no findings for mediastinal/hilar lymphadenopathy or metastatic disease.  There were areas of hypermetabolism involving the left oblique abdominal muscles and the anorectal junction. PFTs on 11/18/2017 revealed an FEV1 of 1.22 liters (51%).  DLCO adj was 6.8 mL/mmHg/min (32%).   ENB done on 12/07/2017. Cytology was negative for malignancy. Pathology demonstrated organizing pneumonia and chronic bronchitis. Pathologist commented that organizing pneumonia spanned about 2 mm in one fragment. Cases of focal organizing pneumonia with hypermetabolism on FDG-PET have been described, but changes of this type can also be adjacent to a neoplasm. Patient prescribed a daily dose of Prednisone 10 mg   07/24/2020  repeat  chest CT on  revealed Lung-RADS 2, benign appearance or behavior. There was emphysema, aortic atherosclerosis, and coronary artery calcifications.  diarrhea.  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  She received azithromycin x 3 days.  She received ciprofloxacin x 10 days without improvement.  She has seen ID in [place].  She began Bactrim on 03/04/2018 (discontinued on 03/11/2018) secondary to increase in creatine.   Chronic kidney disease Urinalysis on 11/15/2017 revealed revealed no hemoglobin, bilirubin or active sediment.   Liver function tests increased on 02/15/2019.  Work-up on 02/16/2019 revealed hepatitis B E antibody positive.  Negative studies included: hepatitis A total antibody, hepatitis A IgM, hepatitis B surface antigen,  hepatitis B E antigen.  Hepatitis B DNA was not detected.  Folate was 80.5 and vitamin B12 was 443.  She has received her vaccinations:  She has completed the PCV13, PPSV23, and HiB.  She received Menvio (quadrivalent meningoccal vaccine) and Bexero (univalent serogroup B meningoccal vaccine) on 07/06/2019.  She was diagnosed with C difficile + diarrhea on 08/12/2020.  She completed a course of oral vancomycin.  Stool was negative for C. difficile on 08/27/2020.  11/17/2020 - 11/20/2020 [facility] admission with left L5 varicella zoster.  Lesion was positive for VZV. She was treated with ceftazidime and a valacyclovir.  She was discharged on 9 days of Valtrex 1 gm 3 times daily followed by 500 mg daily indefinitely for suppression and doxycycline.  History of discoid lupus  #Positive hepatitis B core antibody Previously on entecavir 0.5 mg daily, currently she has finished a course. Dose adjusted based on renal function. CrCl 30-49 ml/min   50% dosing (0.5 mg QOD) CrCl >= 50 ml/min 100% dosing (0.5 mg a day). With her kidney function currently QOD She has completed course of Entecavir and stopped 08/14/2021  # PCP prophylaxis Patient was on Septra DS on Mondays, Wednesdays, and Fridays, which was held due to fluctuated kidney functions.  Currently off Septra.  12/03/2021, CT chest lung cancer screening showed new right middle lobe pulmonary nodule with an aggressive appearance characterized as lung RADS 4 VS.  12/22/2021 PET showed No abnormal FDG avidity associated with the resolving right middle lobe pulmonary nodule now with a 12 mm ground-glass opacity in the area of prior solid nodularity, most consistent with an infectious or inflammatory etiology  INTERVAL HISTORY ROZELYN MACHNIK is a 67 y.o. female who has above history reviewed by me today presents for follow up visit  Patient remained on prednisone 5mg  daily.  Patient continues to have weight gain Chronic fatigue unchanged. S/p   Rituximab treatments.   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/23/2017   Post PTCA 07/12/2016   iliac stents bilaterally 2017   Thrombocytopenia (HCC) 10/04/2016   Wears dentures    full upper and lower    Past Surgical History:  Procedure Laterality Date   ELECTROMAGNETIC NAVIGATION BROCHOSCOPY N/A 12/07/2017   Procedure: ELECTROMAGNETIC NAVIGATION BRONCHOSCOPY;  Surgeon: Erin Fulling, MD;  Location: [facility] ORS;  Service: Cardiopulmonary;  Laterality: N/A;   ESOPHAGOGASTRODUODENOSCOPY (EGD) WITH PROPOFOL N/A 07/08/2016   Procedure: ESOPHAGOGASTRODUODENOSCOPY (EGD) WITH PROPOFOL;  Surgeon: Scot Jun, MD;  Location: [facility] ENDOSCOPY;  Service: Endoscopy;  Laterality: N/A;  KNEE SURGERY Right    repair of acl tear   PERIPHERAL VASCULAR CATHETERIZATION N/A 07/10/2016   Procedure: Lower Extremity Angiography;  Surgeon: Renford Dills, MD;  Location: [facility] INVASIVE CV LAB;  Service: Cardiovascular;  Laterality: N/A;   PERIPHERAL VASCULAR CATHETERIZATION N/A 07/10/2016   Procedure: Abdominal Aortogram w/Lower Extremity;  Surgeon: Renford Dills, MD;  Location: [facility] INVASIVE CV LAB;  Service: Cardiovascular;  Laterality: N/A;   PERIPHERAL VASCULAR CATHETERIZATION  07/10/2016   Procedure: Lower Extremity Intervention;  Surgeon: Renford Dills, MD;  Location: [facility] INVASIVE CV LAB;  Service: Cardiovascular;;    Family History  Problem Relation Age of Onset   Diabetes Mother    Hypertension  Mother    Diabetes Maternal Grandfather    Hypertension Maternal Grandfather    Breast cancer Neg Hx     Social History:  reports that she quit smoking about 6 years ago. Her smoking use included cigarettes. She started smoking about 53 years ago. She has a 58.8 pack-year smoking history. She has never used smokeless tobacco. She reports that she does not drink alcohol and does not use drugs.  Former smoker, 58-pack-year smoking history.   She has a daughter, Yehuda Mao and a daughter named [name].  She lives in [place]. The patient is alone today.  Allergies:  Allergies  Allergen Reactions   Ciprofloxacin Swelling    Facial swelling following single oral dose.    Codeine Anaphylaxis   Nsaids Other (See Comments)    Patient has Hep C.     Current Medications: Current Outpatient Medications  Medication Sig Dispense Refill   albuterol (VENTOLIN HFA) 108 (90 Base) MCG/ACT inhaler SMARTSIG:2 Puff(s) By Mouth Every 4 Hours PRN     allopurinol (ZYLOPRIM) 100 MG tablet TAKE 1 TABLET BY MOUTH EVERY DAY 30 tablet 5   Budeson-Glycopyrrol-Formoterol (BREZTRI AEROSPHERE) 160-9-4.8 MCG/ACT AERO Inhale 2 puffs into the lungs in the morning and at bedtime. 11.8 g 0   CALCIUM PO Take by mouth daily at 12 noon.     clopidogrel (PLAVIX) 75 MG tablet Take 1 tablet (75 mg total) by mouth daily. 30 tablet 5   cyanocobalamin 500 MCG tablet Take 500 mcg by mouth daily.     entecavir (BARACLUDE) 0.5 MG tablet Take 0.5 mg by mouth every other day.     hydroxychloroquine (PLAQUENIL) 200 MG tablet Take 200 mg by mouth 2 (two) times daily.     levothyroxine (SYNTHROID, LEVOTHROID) 75 MCG tablet Take 75 mcg by mouth daily before breakfast.      lisinopril-hydrochlorothiazide (ZESTORETIC) 20-25 MG tablet Take 1 tablet by mouth daily. 90 tablet 3   pantoprazole (PROTONIX) 40 MG tablet Take 1 tablet by mouth once daily 30 tablet 3   rosuvastatin (CRESTOR) 40 MG tablet Take 1 tablet (40 mg total) by mouth daily. 90  tablet 3   VITAMIN D PO Take by mouth daily in the afternoon.     predniSONE (DELTASONE) 5 MG tablet Take 1 tablet (5 mg total) by mouth daily with breakfast. 30 tablet 0   No current facility-administered medications for this visit.    Review of Systems  Constitutional:  Negative for appetite change, chills, fatigue and fever.  HENT:   Negative for hearing loss and voice change.   Eyes:  Negative for eye problems.  Respiratory:  Negative for chest tightness and cough.   Cardiovascular:  Negative for chest pain.  Gastrointestinal:  Negative for abdominal distention, abdominal pain and blood in stool.  Endocrine:  Negative for hot flashes.  Genitourinary:  Negative for difficulty urinating and frequency.   Musculoskeletal:  Negative for arthralgias.  Skin:  Negative for itching and rash.  Neurological:  Negative for extremity weakness.  Hematological:  Negative for adenopathy.  Psychiatric/Behavioral:  Negative for confusion. The patient is not nervous/anxious.      Performance status (ECOG): 1  Vital Signs Blood pressure (!) 126/53, pulse 92, temperature (!) 96 F (35.6 C), temperature source Tympanic, resp. rate 18, weight 194 lb 9.6 oz (88.3 kg), SpO2 99%.  Physical Exam Constitutional:      General: She is not in acute distress.    Appearance: She is obese. She is not diaphoretic.  HENT:     Head: Normocephalic and atraumatic.     Nose: Nose normal.     Mouth/Throat:     Pharynx: No oropharyngeal exudate.  Eyes:     General: No scleral icterus.    Pupils: Pupils are equal, round, and reactive to light.  Cardiovascular:     Rate and Rhythm: Normal rate and regular rhythm.     Heart sounds: No murmur heard. Pulmonary:     Effort: Pulmonary effort is normal. No respiratory distress.  Abdominal:     General: There is no distension.     Palpations: Abdomen is soft.     Tenderness: There is no abdominal tenderness.  Musculoskeletal:        General: Normal range of  motion.     Cervical back: Normal range of motion and neck supple.  Skin:    General: Skin is warm and dry.     Findings: No erythema.  Neurological:     Mental Status: She is alert and oriented to person, place, and time. Mental status is at baseline.     Cranial Nerves: No cranial nerve deficit.     Motor: No abnormal muscle tone.  Psychiatric:        Mood and Affect: Mood and affect normal.      Laboratory findings     Latest Ref Rng & Units 04/12/2023    8:36 AM 03/15/2023    8:27 AM 03/08/2023    8:31 AM  CBC  WBC 4.0 - 10.5 K/uL 8.0  5.4  5.6   Hemoglobin 12.0 - 15.0 g/dL 9.0  9.3  9.2   Hematocrit 36.0 - 46.0 % 29.3  29.6  29.3   Platelets 150 - 400 K/uL 163  162  150       Latest Ref Rng & Units 04/12/2023    8:35 AM 03/15/2023    8:26 AM 03/08/2023    8:31 AM  CMP  Glucose 70 - 99 mg/dL 99  98  91   BUN 8 - 23 mg/dL 50  43  46   Creatinine 0.44 - 1.00 mg/dL 0.34  7.42  5.95   Sodium 135 - 145 mmol/L 138  140  140   Potassium 3.5 - 5.1 mmol/L 4.0  3.7  4.1   Chloride 98 - 111 mmol/L 105  104  106   CO2 22 - 32 mmol/L 24  26  26    Calcium 8.9 - 10.3 mg/dL 9.2  9.8  9.0   Total Protein 6.5 - 8.1 g/dL 7.0     Total Bilirubin 0.3 - 1.2 mg/dL 0.8     Alkaline Phos 38 - 126 U/L 47     AST 15 - 41 U/L 15     ALT 0 - 44 U/L 13

## 2023-04-12 NOTE — Assessment & Plan Note (Signed)
Encourage oral hydration. Avoid nephrotoxins. 

## 2023-04-12 NOTE — Assessment & Plan Note (Addendum)
LDH is normal, adequate B12 and folate level, normal iron panel.  There is increased reticulocyte %-5%.  Cold agglutinin titer negative. Bilirubin is normal. 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  12/22/21 Negative PET scan  05/25/22 Bone marrow biopsy did not reveal any lymphoproliferative diseases. Mild hypocellular marrow, erythroid hyperplasia, mild evidence of spherocytosis, consistent with hemolysis.  Labs are reviewed and discussed with patient. Continue prednisone 5 mg daily.  S/p Rituximab weekly x 4

## 2023-04-12 NOTE — Assessment & Plan Note (Addendum)
History of hepatitis B previously on entecavir after her previous rituximab treatments.  Recommend patient to continue Entecavir 0.5mg  Per ID recommendation  for GFR >=30, take every other day For GFR <30, take every 72 hours.

## 2023-04-12 NOTE — Assessment & Plan Note (Signed)
Hb is stable, <10.  Recommend Epogen 30,000 units monthly if Hb <10

## 2023-04-16 ENCOUNTER — Telehealth: Payer: Self-pay | Admitting: *Deleted

## 2023-04-16 NOTE — Telephone Encounter (Signed)
Received for extension of FMLA for this patient. Form completed and sent to physician for signature

## 2023-04-16 NOTE — Telephone Encounter (Signed)
Form signed and faxed back to [facility]. Call to patient to inform of this.

## 2023-05-04 ENCOUNTER — Encounter: Payer: Self-pay | Admitting: Oncology

## 2023-05-10 ENCOUNTER — Inpatient Hospital Stay: Payer: Medicare HMO | Attending: Oncology

## 2023-05-10 DIAGNOSIS — D591 Autoimmune hemolytic anemia, unspecified: Secondary | ICD-10-CM

## 2023-05-10 DIAGNOSIS — D631 Anemia in chronic kidney disease: Secondary | ICD-10-CM | POA: Insufficient documentation

## 2023-05-10 DIAGNOSIS — N1831 Chronic kidney disease, stage 3a: Secondary | ICD-10-CM | POA: Diagnosis present

## 2023-05-10 DIAGNOSIS — D5911 Warm autoimmune hemolytic anemia: Secondary | ICD-10-CM | POA: Diagnosis present

## 2023-05-10 DIAGNOSIS — Z7952 Long term (current) use of systemic steroids: Secondary | ICD-10-CM | POA: Diagnosis not present

## 2023-05-10 DIAGNOSIS — Z23 Encounter for immunization: Secondary | ICD-10-CM | POA: Insufficient documentation

## 2023-05-10 LAB — CMP (CANCER CENTER ONLY)
ALT: 12 U/L (ref 0–44)
AST: 16 U/L (ref 15–41)
Albumin: 4.2 g/dL (ref 3.5–5.0)
Alkaline Phosphatase: 46 U/L (ref 38–126)
Anion gap: 5 (ref 5–15)
BUN: 32 mg/dL — ABNORMAL HIGH (ref 8–23)
CO2: 28 mmol/L (ref 22–32)
Calcium: 9.1 mg/dL (ref 8.9–10.3)
Chloride: 106 mmol/L (ref 98–111)
Creatinine: 1.51 mg/dL — ABNORMAL HIGH (ref 0.44–1.00)
GFR, Estimated: 38 mL/min — ABNORMAL LOW (ref >60)
Glucose, Bld: 90 mg/dL (ref 70–99)
Potassium: 3.5 mmol/L (ref 3.5–5.1)
Sodium: 139 mmol/L (ref 135–145)
Total Bilirubin: 0.7 mg/dL (ref 0.3–1.2)
Total Protein: 6.7 g/dL (ref 6.5–8.1)

## 2023-05-10 LAB — CBC WITH DIFFERENTIAL (CANCER CENTER ONLY)
Abs Immature Granulocytes: 0.02 10*3/uL (ref 0.00–0.07)
Basophils Absolute: 0.1 10*3/uL (ref 0.0–0.1)
Basophils Relative: 1 %
Eosinophils Absolute: 0.2 10*3/uL (ref 0.0–0.5)
Eosinophils Relative: 3 %
HCT: 32 % — ABNORMAL LOW (ref 36.0–46.0)
Hemoglobin: 9.9 g/dL — ABNORMAL LOW (ref 12.0–15.0)
Immature Granulocytes: 0 %
Lymphocytes Relative: 24 %
Lymphs Abs: 1.2 10*3/uL (ref 0.7–4.0)
MCH: 33.2 pg (ref 26.0–34.0)
MCHC: 30.9 g/dL (ref 30.0–36.0)
MCV: 107.4 fL — ABNORMAL HIGH (ref 80.0–100.0)
Monocytes Absolute: 0.4 10*3/uL (ref 0.1–1.0)
Monocytes Relative: 8 %
Neutro Abs: 3.2 10*3/uL (ref 1.7–7.7)
Neutrophils Relative %: 64 %
Platelet Count: 153 10*3/uL (ref 150–400)
RBC: 2.98 MIL/uL — ABNORMAL LOW (ref 3.87–5.11)
RDW: 15.5 % (ref 11.5–15.5)
WBC Count: 5 10*3/uL (ref 4.0–10.5)
nRBC: 0 % (ref 0.0–0.2)

## 2023-05-13 ENCOUNTER — Encounter: Payer: Self-pay | Admitting: Oncology

## 2023-05-13 ENCOUNTER — Inpatient Hospital Stay: Payer: Medicare HMO

## 2023-05-13 ENCOUNTER — Inpatient Hospital Stay: Payer: Medicare HMO | Admitting: Oncology

## 2023-05-13 VITALS — BP 109/59 | HR 79 | Temp 96.6°F | Resp 18 | Wt 196.8 lb

## 2023-05-13 DIAGNOSIS — D591 Autoimmune hemolytic anemia, unspecified: Secondary | ICD-10-CM

## 2023-05-13 DIAGNOSIS — R768 Other specified abnormal immunological findings in serum: Secondary | ICD-10-CM | POA: Diagnosis not present

## 2023-05-13 DIAGNOSIS — N1832 Chronic kidney disease, stage 3b: Secondary | ICD-10-CM | POA: Diagnosis not present

## 2023-05-13 DIAGNOSIS — D631 Anemia in chronic kidney disease: Secondary | ICD-10-CM

## 2023-05-13 DIAGNOSIS — N1831 Chronic kidney disease, stage 3a: Secondary | ICD-10-CM | POA: Diagnosis not present

## 2023-05-13 DIAGNOSIS — Z23 Encounter for immunization: Secondary | ICD-10-CM

## 2023-05-13 MED ORDER — INFLUENZA VAC A&B SURF ANT ADJ 0.5 ML IM SUSY
0.5000 mL | PREFILLED_SYRINGE | Freq: Once | INTRAMUSCULAR | Status: AC
  Administered 2023-05-13: 0.5 mL via INTRAMUSCULAR
  Filled 2023-05-13: qty 0.5

## 2023-05-13 MED ORDER — EPOETIN ALFA 20000 UNIT/ML IJ SOLN
20000.0000 [IU] | Freq: Once | INTRAMUSCULAR | Status: AC
  Administered 2023-05-13: 20000 [IU] via INTRAVENOUS
  Filled 2023-05-13: qty 1

## 2023-05-13 MED ORDER — PREDNISONE 1 MG PO TABS: 2.0000 mg | ORAL_TABLET | Freq: Every day | ORAL | 0 refills | Status: DC

## 2023-05-13 NOTE — Progress Notes (Signed)
Hematology/Oncology Progress note [phone] [phone]      Clinic Day:  05/13/2023  ASSESSMENT & PLAN:   Autoimmune hemolytic anemia (HCC) LDH is normal, adequate B12 and folate level, normal iron panel.  There is increased reticulocyte %-5%.  Cold agglutinin titer negative. Bilirubin is normal. 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  12/22/21 Negative PET scan  05/25/22 Bone marrow biopsy did not reveal any lymphoproliferative diseases. Mild hypocellular marrow, erythroid hyperplasia, mild evidence of spherocytosis, consistent with hemolysis.  Labs are reviewed and discussed with patient. Continue prednisone, decrease to 2 mg daily.  S/p Rituximab weekly x 4 hemoglobin is stable.   Hepatitis B core antibody positive History of hepatitis B previously on entecavir after her previous rituximab treatments.  Recommend patient to continue Entecavir 0.5mg  Per ID recommendation  for GFR >=30, take every other day For GFR <30, take every 72 hours.     CKD (chronic kidney disease) stage 3, GFR 30-59 ml/min (HCC) Encourage oral hydration.  Avoid nephrotoxins.    Anemia in chronic kidney disease (CKD) Hb is stable, <10.  Recommend Epogen 20,000 units monthly if Hb <11  Orders Placed This Encounter  Procedures   CBC with Differential (Cancer Center Only)    Standing Status:   Future    Standing Expiration Date:   05/12/2024   CMP (Cancer Center only)    Standing Status:   Future    Standing Expiration Date:   05/12/2024   CMP (Cancer Center only)    Standing Status:   Future    Standing Expiration Date:   05/12/2024   CBC with Differential (Cancer Center Only)    Standing Status:   Future    Standing Expiration Date:   05/12/2024   Haptoglobin    Standing Status:   Future    Standing Expiration Date:   05/12/2024   CBC  with Differential (Cancer Center Only)    Standing Status:   Future    Standing Expiration Date:   05/12/2024    Follow up  lab in 4 weeks + Epogen -cbc  8 weeks, lab + Epogen - cbc cmp haptoglobin 3 months ab MD + Epogen - cbc cmp   All questions were answered. The patient knows to call the clinic with any problems, questions or concerns.  [name], MD, PhD [facility] Hematology Oncology 05/13/2023   Chief Complaint: Tina Page is a 67 y.o. 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  Reticulocyte count was 11.3%.  Ferritin was 562.  Iron studies revealed a saturation of 20% and a TIBC of 214 (low).  B12 was 231 (low normal).  Folate was 42.   Peripheral smear revealed rouleaux formation.    Additional testing included the following + studies:  hepatitis C antibody, hepatitis B core antibody, CMV IgM, and EBV VCA (IgM and IgG).  Hepatitis B by PCR was negative.  Hepatitis C RNA was negative.  Mycoplasma pneumonia IgM was negative.  Reticulocyte count was 11.3% (high) indicating appropriate marrow response.  C3 and C4 were normal.  Cold agglutinin titer was negative x  2.  Negative studies included:  ANA, hepatitis B surface antigen, SPEP, and free light chain ratio.   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  MMA was initially 330 (normal).  Repeat MMA was elevated on 08/01/2016 confirming B12 deficiency.  Hepatitis B surface antibody was positive and hepatitis B surface antigen was negative on 08/06/2016.   07/07/2016  Chest, abdomen, and pelvis CT angiogram on revealed moderate diffuse atherosclerotic vascular disease of  the abdominal aorta with severe stenosis of the left common iliac artery with suspected short segment occlusion. There was no adenopathy.  Spleen was normal.   04/15/2017 Abd US revealed a normal spleen and sludge in the gallbladder.  The liver was echogenic consistent fatty infiltration and/or hepatocellular disease.  For hemolytic anemia treatments, in 2017, She has received multiple units of  warmed PRBCs to date.  08/01/2016-03/12/2017, steroid treatment with prednisone.  She is also on folic acid 1 mg daily. Borderline low vitamin B12 level on 07/09/2016.  Patient has been on oral vitamin B12 supplementation.  10/12/2017  positive warm autoantibody.  LDH and bilirubin were normal.   She began prednisone 10 mg on 12/07/2017 (discontinued 03/2018).  She received Rituxan weekly x 4 (last dose 06/16/2018).  Entecavir was discontinued on 07/06/2019.  06/12/2020 developed recurrent anemia.  Hematocrit was 27.6, hemoglobin 8.4, MCV 104.5, platelets 138,000, WBC 3,400 (ANC 2,300). Creatinine was 1.17 (CrCl 50 ml/min).  Positive warm autoantibody  Reticulocyte count was 7.1%.  LDH was 204 (98-192).  06/16/2020, began prednisone 1 mg/kilogram treatments, 07/24/2020, prednisone was tapered down to 20 mg.  She received Rituxan 1000 mg on day 1 and 15 (07/15/2020 and 07/29/2020).  She began entecavir on 06/28/2020.  Prednisone was tapered off.  She was on Septra for PCP prophylaxis which is now currently on hold    Other medical problems 07/10/2016.  She underwent PTCA and stent placement in right and left common iliac arteries and left external iliac artery. She is on Plavix.   03/19/2015 EGD revealed gastritis in the body and antrum.  Colonoscopy revealed one hyperplastic polyp.  Repeat EGD on 07/08/2016 was normal.  No evidence of bleeding.  History of she developed peri-oral and intranasal herpes simplex-1.  She was treated with valacyclovir and doxycycline on 10/19/2016.   10/26/2017. She developed flu  like symptoms.  Symptoms included cough, myalgias, and fever (tmax unknown).  She was prescribed Mucinex, Tamiflu, and amoxicillin.  She only took amoxicillin x 5 days.  She developed increased liver function tests on 11/02/2017. CMV IgG was positive.  EBV VCA IgG, NA IgG, early antigen antibody IgG were elevated.  EBV VCA IgM was < 36.  Testing was c/w a convalescence/past infection or reactivated infection.  LFTs normalized on 11/15/2017.  Smooth muscle antibody was 39 (high) on 11/10/2017 and 34 (high) on 11/30/2017.     Chest CT on 11/16/2017 revealed a 2.2 x 2.0 x 2.4 spiculated RIGHT middle lobe mass.  There was tiny nonspecific upper lobe pulmonary nodules greater on RIGHT, largest 3 mm, of uncertain etiology.  There was additional 8 x 8 mm opacity in the posterior sulcus of the RIGHT lower lobe. PET scan on 11/22/2017 revealed a 2 cm hypermetabolic right middle lobe lung mass (SUV 3.4) consistent  with primary lung neoplasm.  There were no findings for mediastinal/hilar lymphadenopathy or metastatic disease.  There were areas of hypermetabolism involving the left oblique abdominal muscles and the anorectal junction. PFTs on 11/18/2017 revealed an FEV1 of 1.22  liters (51%).  DLCO adj was 6.8 mL/mmHg/min (32%).   ENB done on 12/07/2017. Cytology was negative for malignancy. Pathology demonstrated organizing pneumonia and chronic bronchitis. Pathologist commented that organizing pneumonia spanned about 2 mm in one fragment. Cases of focal organizing pneumonia with hypermetabolism on FDG-PET have been described, but changes of this type can also be adjacent to a neoplasm. Patient prescribed a daily dose of Prednisone 10 mg   07/24/2020  repeat  chest CT on  revealed Lung-RADS 2, benign appearance or behavior. There was emphysema, aortic atherosclerosis, and coronary artery calcifications.  diarrhea.  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  She received azithromycin x 3  days.  She received ciprofloxacin x 10 days without improvement.  She has seen ID in [place].  She began Bactrim on 03/04/2018 (discontinued on 03/11/2018) secondary to increase in creatine.   Chronic kidney disease Urinalysis on 11/15/2017 revealed revealed no hemoglobin, bilirubin or active sediment.   Liver function tests increased on 02/15/2019.  Work-up on 02/16/2019 revealed hepatitis B E antibody positive.  Negative studies included: hepatitis A total antibody, hepatitis A IgM, hepatitis B surface antigen, hepatitis B E antigen.  Hepatitis B DNA was not detected.  Folate was 80.5 and vitamin B12 was 443.  She has received her vaccinations:  She has completed the PCV13, PPSV23, and HiB.  She received Menvio (quadrivalent meningoccal vaccine) and Bexero (univalent serogroup B meningoccal vaccine) on 07/06/2019.  She was diagnosed with C difficile + diarrhea on 08/12/2020.  She completed a course of oral vancomycin.  Stool was negative for C. difficile on 08/27/2020.  11/17/2020 - 11/20/2020 [facility] admission with left L5 varicella zoster.  Lesion was positive for VZV. She was treated with ceftazidime and a valacyclovir.  She was discharged on 9 days of Valtrex 1 gm 3 times daily followed by 500 mg daily indefinitely for suppression and doxycycline.  History of discoid lupus  #Positive hepatitis B core antibody Previously on entecavir 0.5 mg daily, currently she has finished a course. Dose adjusted based on renal function. CrCl 30-49 ml/min   50% dosing (0.5 mg QOD) CrCl >= 50 ml/min 100% dosing (0.5 mg a day). With her kidney function currently QOD She has completed course of Entecavir and stopped 08/14/2021  # PCP prophylaxis Patient was on Septra DS on Mondays, Wednesdays, and Fridays, which was held due to fluctuated kidney functions.  Currently off Septra.  12/03/2021, CT chest lung cancer screening showed new right middle lobe pulmonary nodule with an aggressive appearance  characterized as lung RADS 4 VS.  12/22/2021 PET showed No abnormal FDG avidity associated with the resolving right middle lobe pulmonary nodule now with a 12 mm ground-glass opacity in the area of prior solid nodularity, most consistent with an infectious or inflammatory etiology  INTERVAL HISTORY LEXEY CADAVID is a 67 y.o. female who has above history reviewed by me today presents for follow up visit  Patient remained on prednisone 5mg  daily.  Patient continues to have weight gain Chronic fatigue unchanged. S/p  Rituximab treatments.  She tolerates reporting injections.  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/23/2017   Post PTCA 07/12/2016   iliac stents bilaterally 2017   Thrombocytopenia (HCC) 10/04/2016   Wears dentures    full upper and lower    Past Surgical History:  Procedure Laterality Date   ELECTROMAGNETIC NAVIGATION BROCHOSCOPY N/A 12/07/2017   Procedure: ELECTROMAGNETIC NAVIGATION BRONCHOSCOPY;  Surgeon: Erin Fulling, MD;  Location: [facility] ORS;  Service: Cardiopulmonary;  Laterality: N/A;   ESOPHAGOGASTRODUODENOSCOPY (EGD) WITH PROPOFOL N/A 07/08/2016   Procedure: ESOPHAGOGASTRODUODENOSCOPY (EGD) WITH PROPOFOL;  Surgeon: Scot Jun, MD;  Location: [facility] ENDOSCOPY;  Service: Endoscopy;  Laterality: N/A;   KNEE SURGERY Right    repair of acl tear   PERIPHERAL VASCULAR CATHETERIZATION N/A 07/10/2016   Procedure: Lower Extremity Angiography;  Surgeon: Renford Dills, MD;   Location: [facility] INVASIVE CV LAB;  Service: Cardiovascular;  Laterality: N/A;   PERIPHERAL VASCULAR CATHETERIZATION N/A 07/10/2016   Procedure: Abdominal Aortogram w/Lower Extremity;  Surgeon: Renford Dills, MD;  Location: [facility] INVASIVE CV LAB;  Service: Cardiovascular;  Laterality: N/A;   PERIPHERAL VASCULAR CATHETERIZATION  07/10/2016   Procedure: Lower Extremity Intervention;  Surgeon: Renford Dills, MD;  Location: [facility] INVASIVE CV LAB;  Service: Cardiovascular;;    Family History  Problem Relation Age of Onset   Diabetes Mother    Hypertension Mother    Diabetes Maternal Grandfather    Hypertension Maternal Grandfather    Breast cancer Neg Hx     Social History:  reports that she quit smoking about 6 years ago. Her smoking use included cigarettes. She started smoking about 53 years ago. She has a 58.8 pack-year smoking history. She has never used smokeless tobacco. She reports that she does not drink alcohol and does not use drugs.  Former smoker, 58-pack-year smoking history.   She has a daughter, Yehuda Mao and a daughter named [name].  She lives in [place]. The patient is alone today.  Allergies:  Allergies  Allergen Reactions   Ciprofloxacin Swelling    Facial swelling following single oral dose.    Codeine Anaphylaxis   Nsaids Other (See Comments)    Patient has Hep C.     Current Medications: Current Outpatient Medications  Medication Sig Dispense Refill   albuterol (VENTOLIN HFA) 108 (90 Base) MCG/ACT inhaler SMARTSIG:2 Puff(s) By Mouth Every 4 Hours PRN     allopurinol (ZYLOPRIM) 100 MG tablet TAKE 1 TABLET BY MOUTH EVERY DAY 30 tablet 5   Budeson-Glycopyrrol-Formoterol (BREZTRI AEROSPHERE) 160-9-4.8 MCG/ACT AERO Inhale 2 puffs into the lungs in the morning and at bedtime. 11.8 g 0   CALCIUM PO Take by mouth daily at 12 noon.     clopidogrel (PLAVIX) 75 MG tablet Take 1 tablet (75 mg total) by mouth daily. 30 tablet 5   cyanocobalamin 500 MCG tablet Take 500 mcg  by mouth daily.     entecavir (BARACLUDE) 0.5 MG tablet Take 0.5 mg by mouth every other day.     hydroxychloroquine (PLAQUENIL) 200 MG tablet Take 200 mg by mouth 2 (two) times daily.     levothyroxine (SYNTHROID, LEVOTHROID) 75 MCG tablet Take 75 mcg by mouth daily before breakfast.      lisinopril-hydrochlorothiazide (ZESTORETIC) 20-25 MG tablet Take 1 tablet by mouth daily. 90 tablet 3   pantoprazole (PROTONIX) 40 MG tablet Take 1 tablet by mouth once daily 30 tablet 3   predniSONE (DELTASONE) 1 MG  tablet Take 2 tablets (2 mg total) by mouth daily with breakfast. 180 tablet 0   rosuvastatin (CRESTOR) 40 MG tablet Take 1 tablet (40 mg total) by mouth daily. 90 tablet 3   VITAMIN D PO Take by mouth daily in the afternoon.     No current facility-administered medications for this visit.    Review of Systems  Constitutional:  Negative for appetite change, chills, fatigue and fever.  HENT:   Negative for hearing loss and voice change.   Eyes:  Negative for eye problems.  Respiratory:  Negative for chest tightness and cough.   Cardiovascular:  Negative for chest pain.  Gastrointestinal:  Negative for abdominal distention, abdominal pain and blood in stool.  Endocrine: Negative for hot flashes.  Genitourinary:  Negative for difficulty urinating and frequency.   Musculoskeletal:  Negative for arthralgias.  Skin:  Negative for itching and rash.  Neurological:  Negative for extremity weakness.  Hematological:  Negative for adenopathy.  Psychiatric/Behavioral:  Negative for confusion. The patient is not nervous/anxious.      Performance status (ECOG): 1  Vital Signs Blood pressure (!) 109/59, pulse 79, temperature (!) 96.6 F (35.9 C), temperature source Tympanic, resp. rate 18, weight 196 lb 12.8 oz (89.3 kg), SpO2 100%.  Physical Exam Constitutional:      General: She is not in acute distress.    Appearance: She is obese. She is not diaphoretic.  HENT:     Head: Normocephalic and  atraumatic.     Nose: Nose normal.     Mouth/Throat:     Pharynx: No oropharyngeal exudate.  Eyes:     General: No scleral icterus.    Pupils: Pupils are equal, round, and reactive to light.  Cardiovascular:     Rate and Rhythm: Normal rate and regular rhythm.     Heart sounds: No murmur heard. Pulmonary:     Effort: Pulmonary effort is normal. No respiratory distress.  Abdominal:     General: There is no distension.     Palpations: Abdomen is soft.     Tenderness: There is no abdominal tenderness.  Musculoskeletal:        General: Normal range of motion.     Cervical back: Normal range of motion and neck supple.  Skin:    General: Skin is warm and dry.     Findings: No erythema.  Neurological:     Mental Status: She is alert and oriented to person, place, and time. Mental status is at baseline.     Cranial Nerves: No cranial nerve deficit.     Motor: No abnormal muscle tone.  Psychiatric:        Mood and Affect: Mood and affect normal.      Laboratory findings     Latest Ref Rng & Units 05/10/2023    9:04 AM 04/12/2023    8:36 AM 03/15/2023    8:27 AM  CBC  WBC 4.0 - 10.5 K/uL 5.0  8.0  5.4   Hemoglobin 12.0 - 15.0 g/dL 9.9  9.0  9.3   Hematocrit 36.0 - 46.0 % 32.0  29.3  29.6   Platelets 150 - 400 K/uL 153  163  162       Latest Ref Rng & Units 05/10/2023    9:04 AM 04/12/2023    8:35 AM 03/15/2023    8:26 AM  CMP  Glucose 70 - 99 mg/dL 90  99  98   BUN 8 - 23 mg/dL 32  50  43   Creatinine 0.44 - 1.00 mg/dL 1.61  0.96  0.45   Sodium 135 - 145 mmol/L 139  138  140   Potassium 3.5 - 5.1 mmol/L 3.5  4.0  3.7   Chloride 98 - 111 mmol/L 106  105  104   CO2 22 - 32 mmol/L 28  24  26    Calcium 8.9 - 10.3 mg/dL 9.1  9.2  9.8   Total Protein 6.5 - 8.1 g/dL 6.7  7.0    Total Bilirubin 0.3 - 1.2 mg/dL 0.7  0.8    Alkaline Phos 38 - 126 U/L 46  47    AST 15 - 41 U/L 16  15    ALT 0 - 44 U/L 12  13

## 2023-05-13 NOTE — Assessment & Plan Note (Signed)
Hb is stable, <10.  Recommend Epogen 20,000 units monthly if Hb <11

## 2023-05-13 NOTE — Assessment & Plan Note (Signed)
Encourage oral hydration. Avoid nephrotoxins. 

## 2023-05-13 NOTE — Assessment & Plan Note (Addendum)
LDH is normal, adequate B12 and folate level, normal iron panel.  There is increased reticulocyte %-5%.  Cold agglutinin titer negative. Bilirubin is normal. 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  12/22/21 Negative PET scan  05/25/22 Bone marrow biopsy did not reveal any lymphoproliferative diseases. Mild hypocellular marrow, erythroid hyperplasia, mild evidence of spherocytosis, consistent with hemolysis.  Labs are reviewed and discussed with patient. Continue prednisone, decrease to 2 mg daily.  S/p Rituximab weekly x 4 hemoglobin is stable.

## 2023-05-13 NOTE — Assessment & Plan Note (Signed)
History of hepatitis B previously on entecavir after her previous rituximab treatments.  Recommend patient to continue Entecavir 0.5mg  Per ID recommendation  for GFR >=30, take every other day For GFR <30, take every 72 hours.

## 2023-05-25 ENCOUNTER — Ambulatory Visit: Payer: Medicare HMO | Attending: Acute Care

## 2023-05-25 DIAGNOSIS — J449 Chronic obstructive pulmonary disease, unspecified: Secondary | ICD-10-CM | POA: Insufficient documentation

## 2023-05-25 DIAGNOSIS — R911 Solitary pulmonary nodule: Secondary | ICD-10-CM | POA: Insufficient documentation

## 2023-05-25 DIAGNOSIS — Z79899 Other long term (current) drug therapy: Secondary | ICD-10-CM | POA: Diagnosis not present

## 2023-05-25 DIAGNOSIS — Z87891 Personal history of nicotine dependence: Secondary | ICD-10-CM | POA: Diagnosis not present

## 2023-05-25 LAB — PULMONARY FUNCTION TEST ARMC ONLY
DL/VA % pred: 47 %
DL/VA: 1.99 ml/min/mmHg/L
DLCO unc % pred: 35 %
DLCO unc: 6.86 ml/min/mmHg
FEF 25-75 Post: 0.78 L/sec
FEF 25-75 Pre: 0.48 L/sec
FEF2575-%Change-Post: 62 %
FEF2575-%Pred-Post: 37 %
FEF2575-%Pred-Pre: 23 %
FEV1-%Change-Post: 18 %
FEV1-%Pred-Post: 42 %
FEV1-%Pred-Pre: 35 %
FEV1-Post: 0.99 L
FEV1-Pre: 0.84 L
FEV1FVC-%Change-Post: 2 %
FEV1FVC-%Pred-Pre: 84 %
FEV6-%Change-Post: 15 %
FEV6-%Pred-Post: 50 %
FEV6-%Pred-Pre: 44 %
FEV6-Post: 1.49 L
FEV6-Pre: 1.29 L
FEV6FVC-%Pred-Post: 104 %
FEV6FVC-%Pred-Pre: 104 %
FVC-%Change-Post: 15 %
FVC-%Pred-Post: 48 %
FVC-%Pred-Pre: 42 %
FVC-Post: 1.49 L
FVC-Pre: 1.29 L
Post FEV1/FVC ratio: 67 %
Post FEV6/FVC ratio: 100 %
Pre FEV1/FVC ratio: 65 %
Pre FEV6/FVC Ratio: 100 %
RV % pred: 160 %
RV: 3.27 L
TLC % pred: 98 %
TLC: 4.81 L

## 2023-05-25 MED ORDER — ALBUTEROL SULFATE (2.5 MG/3ML) 0.083% IN NEBU
2.5000 mg | INHALATION_SOLUTION | Freq: Once | RESPIRATORY_TRACT | Status: AC
  Administered 2023-05-25: 2.5 mg via RESPIRATORY_TRACT
  Filled 2023-05-25: qty 3

## 2023-05-26 ENCOUNTER — Telehealth: Payer: Self-pay

## 2023-05-26 ENCOUNTER — Ambulatory Visit: Payer: Medicare HMO | Admitting: Pulmonary Disease

## 2023-05-26 ENCOUNTER — Encounter: Payer: Self-pay | Admitting: Pulmonary Disease

## 2023-05-26 VITALS — BP 110/68 | HR 93 | Temp 98.1°F | Ht 63.0 in | Wt 198.2 lb

## 2023-05-26 DIAGNOSIS — J4489 Other specified chronic obstructive pulmonary disease: Secondary | ICD-10-CM

## 2023-05-26 DIAGNOSIS — M359 Systemic involvement of connective tissue, unspecified: Secondary | ICD-10-CM

## 2023-05-26 DIAGNOSIS — R911 Solitary pulmonary nodule: Secondary | ICD-10-CM

## 2023-05-26 DIAGNOSIS — D591 Autoimmune hemolytic anemia, unspecified: Secondary | ICD-10-CM

## 2023-05-26 DIAGNOSIS — J449 Chronic obstructive pulmonary disease, unspecified: Secondary | ICD-10-CM

## 2023-05-26 MED ORDER — OHTUVAYRE 3 MG/2.5ML IN SUSP: 3.0000 mg | Freq: Two times a day (BID) | RESPIRATORY_TRACT | Status: AC

## 2023-05-26 NOTE — Telephone Encounter (Signed)
Form and office note has been faxed.  Nothing further needed.

## 2023-05-26 NOTE — Progress Notes (Signed)
Subjective:    Patient ID: RIMSHA KLINK, female    DOB: April 13, 1956, 67 y.o.   MRN: [id number]  Patient Care Team: Abram Sander, MD as PCP - General (Family Medicine) Mick Sell, MD (Infectious Diseases) Glory Buff, RN as Registered Nurse Midge Minium, MD as Consulting Physician (Gastroenterology) Lynn Ito, MD as Consulting Physician (Infectious Diseases) Antonieta Iba, MD as Consulting Physician (Cardiology) [name], MD as Consulting Physician (Oncology)  Chief Complaint  Patient presents with   Follow-up    DOE. Wheezing. No cough.    HPI Tina Page is a very complex 67 year old female, former smoker, quit in 2017 (63-pack-year history). 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 coli, impetigo, autoimmune hemolytic anemia, B12 deficiency, elevated liver function testing, hyperlipidemia, pulmonary nodules and undifferentiated connective tissue disease.  She presents today for scheduled follow-up.  Last seen on 05 Jan 2023 by me.  At that time she was noted to be well compensated and was instructed to continue use of Breztri 2 puffs twice a day.  She continues to have significant dyspnea on exertion, notes wheezing and chest congestion that is difficult to expectorate.  No cough.  No hemoptysis.  Average use of rescue inhaler is twice a day.  She is on prednisone at 1 mg daily for her rheumatologic/connective tissue disease issues.  Not for pulmonary issues.  She had a CT chest follow-up on 09 March 2023 which shows that the nodule of interest in the anterior right lower lobe is mildly progressive at 7.2 mm previously noted to be 6.1 mm.  Still below the threshold for PET characterization.  She has follow-up CT scheduled for 01 June 2023.  I discussed the results with the patient.  She had pulmonary function testing performed yesterday, these were discussed with the  patient. She has an FEV1 of 0.84 L or 35% predicted, FVC of 1.29 L or 42% predicted, FEV1/FVC of 65%, there is significant bronchodilator response, diffusion capacity is severely impaired.  There is air trapping.  Compared to her November 2023 testing this is essentially unchanged however she has had significant decline in FEV1 since 2021.  I discussed the results with the patient.  DATA PFTS: 11/18/2017 PFTs: FEV1 1.22 L or 51% predicted, FVC 1.88 L or 63% predicted, FEV1/FVC 65%, no bronchodilator response.  Lung volumes normal, diffusion capacity moderate to severely impaired. 07/07/2022 PFTs: FEV1 0.86 L or 36% predicted, FVC 1.34 L or 43% predicted, FEV1/FVC 64%, there is significant bronchodilator response with both response in FEV1 and FVC improvement.  No hyperinflation, air trapping evident. Moderate to severe diffusion capacity impairment.  There is modest decline in lung function when compared to prior. Consistent with asthma/COPD overlap. IMAGING: 02/16/18 CT chest wo contrast- showed almost complete resolution of spiculated nodules RML. Stable 5mm right parahilar nodules since March 2019 but not present in 2017.  06/27/19 CT chest wo contrast-  previously described nodule of the right middle lobe has almost completely resolved, with residual linear scarring. There is persistent paramedian consolidation of the medial right middle lobe and lingula generally in keeping with atypical infection, particularly atypical mycobacterium. Interval increase in size of a small nodule of the right middle lobe, now measuring 4 mm, previously 2 mm Unchanged tiny biapical pulmonary nodules, benign sequelae of nonspecific infection or inflammation, possibly related cigarette smoking, atypical infection, or other inhalational, inflammatory lung disease. 12/27/19 CT Chest wo contrast- stable sub-centimeter bilateral pulmonary nodules  consistent with benign etiology. Stable paramedian consolidation RML and lingula.  New airspace disease inferior RML with a few adjacent subcenterimeter nodules. Overall, consistent with waxing and waning atypical infectious process such as MAI 07/24/2020 chest LDCT: Lung RADS 2, benign appearance or behavior.  Moderate centrilobular emphysema, multiple nonsolid and solid tiny nodules largest of which was 4.2 mm 12/02/2021 LDCT chest: New right middle lobe pulmonary nodule with aggressive appearance categorized as lung RADS 4 BS, suspicious.  Mild diffuse bronchial wall thickening and mild centrilobular and paraseptal emphysema imaging findings suggestive of underlying COPD 12/22/2021 PET/CT: The previously noted right middle pulmonary nodule appears to be resolving, no FDG avidity noted.  Consistent with infectious/inflammatory etiology.  Recommend continued radiographic follow-up to resolution.  Hypermetabolic activity in the intercostal muscles and crura of the diaphragm reflecting physiologic activity related to ventilation 03/05/2022 LDCT chest: Lung RADS 3, probably benign findings, new 5.9 mm right lower lobe nodule, short-term follow-up 6 months recommended.  Continued decrease in the size of the right middle lobe nodule. 09/07/2022 LDCT chest: Lung RADS 4A, suspicious, small solid anterior right lower lobe pulmonary nodule measuring 5.2 mm below PET/CT resolution, recommend follow-up CT 3 months. 10/23/2022 echocardiogram: LVEF 60 to 65% mild LVH, no wall motion abnormalities, grade 1 DD, right ventricular systolic function, no pulmonary artery hypertension.  No valvular abnormalities. 12/07/2022 LDCT chest: Lung RADS 4A, suspicious, minimally progressive irregular nodule in the right lower lobe measuring 6.1 mm still below PET/CT resolution, suggest additional follow-up in 3 months. 03/09/2023 LDCT chest: Lung RADS 4A, suspicious, 7.2 mm right lower lobe nodule remains mildly progressive however still below the threshold for PET sensitivity.  Additional repeat low-dose CT in 3 to  6 months. 05/25/2023 PFTs: FEV1 0.84 L or 35% predicted, FVC 1.29 L or 42% predicted, FEV1/FVC 65%, there is significant postbronchodilator response.  Lung volumes show air trapping.  Diffusion capacity severely impaired.    Review of Systems A 10 point review of systems was performed and it is as noted above otherwise negative.   Patient Active Problem List   Diagnosis Date Noted   Anemia in chronic kidney disease (CKD) 03/30/2022   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   Preseptal cellulitis of right eye 08/29/2021   Leukopenia 11/22/2020   Herpes zoster without complication    Left leg pain 11/17/2020   Diarrhea 09/01/2020   Clostridium difficile diarrhea 08/26/2020   Lupus (HCC) 08/11/2020   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HCC) 08/11/2020   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) 08/11/2020   Papular rash 08/05/2020   Bacteremia due to Escherichia coli 07/17/2020   UTI (urinary tract infection) 07/16/2020   Hypothyroidism    Fever    Encounter for antineoplastic immunotherapy 07/15/2020   Goals of care, counseling/discussion 06/19/2020   Abnormal CT of the chest 06/04/2020   Oral candidiasis 06/04/2020   Former smoker 06/04/2020   Pulmonary nodules 12/28/2019   CKD (chronic kidney disease) stage 3, GFR 30-59 ml/min (HCC) 05/15/2019   Postop check 04/24/2019   Paronychia of great toe of right foot 04/17/2019   Ingrowing nail 04/17/2019   Medication monitoring encounter 03/25/2018   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  Elevated liver function tests 11/03/2017   Elevated uric acid in blood 11/03/2017    Low serum cortisol level 01/04/2017   Elevated alkaline phosphatase level 10/25/2016   Oral herpes simplex infection 10/25/2016   Current chronic use of systemic steroids 10/23/2016   Impetigo 10/23/2016   Thrombocytopenia (HCC) 10/04/2016   B12 deficiency 08/06/2016   Symptomatic anemia 07/21/2016   Atherosclerosis of native arteries of extremity with intermittent claudication (HCC) 07/20/2016   PAD (peripheral artery disease) (HCC) 07/12/2016   Post PTCA 07/12/2016   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   Autoimmune hemolytic anemia, 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/12/2016   Hepatitis B core antibody positive 07/12/2016   Tobacco abuse 07/12/2016   Leg pain 07/07/2016   Essential hypertension 07/07/2016   HLD (hyperlipidemia) 07/07/2016   COPD (chronic obstructive pulmonary disease) (HCC) 07/07/2016   Acute kidney injury superimposed on CKD (HCC) 07/07/2016   Heme positive stool 01/29/2015    Social History   Tobacco Use   Smoking status: Former    Current packs/day: 0.00    Average packs/day: 1.3 packs/day for 47.0 years (58.8 ttl pk-yrs)    Types: Cigarettes    Start date: 07/11/1969    Quit date: 07/11/2016    Years since quitting: 6.8   Smokeless tobacco: Never  Substance Use Topics   Alcohol use: No    Allergies  Allergen Reactions   Ciprofloxacin Swelling    Facial swelling following single oral dose.    Codeine Anaphylaxis   Nsaids Other (See Comments)    Patient has Hep C.     Current Meds  Medication Sig   albuterol (VENTOLIN HFA) 108 (90 Base) MCG/ACT inhaler SMARTSIG:2 Puff(s) By Mouth Every 4 Hours PRN   allopurinol (ZYLOPRIM) 100 MG tablet TAKE 1 TABLET BY MOUTH EVERY DAY   Budeson-Glycopyrrol-Formoterol (BREZTRI AEROSPHERE) 160-9-4.8 MCG/ACT AERO Inhale 2 puffs into the lungs in the morning and at bedtime.   CALCIUM PO Take by mouth daily at 12 noon.   clopidogrel (PLAVIX) 75 MG tablet Take 1 tablet (75 mg total) by mouth daily.   cyanocobalamin  500 MCG tablet Take 500 mcg by mouth daily.   entecavir (BARACLUDE) 0.5 MG tablet Take 0.5 mg by mouth every other day.   hydroxychloroquine (PLAQUENIL) 200 MG tablet Take 200 mg by mouth 2 (two) times daily.   levothyroxine (SYNTHROID, LEVOTHROID) 75 MCG tablet Take 75 mcg by mouth daily before breakfast.    lisinopril-hydrochlorothiazide (ZESTORETIC) 20-25 MG tablet Take 1 tablet by mouth daily.   pantoprazole (PROTONIX) 40 MG tablet Take 1 tablet by mouth once daily   predniSONE (DELTASONE) 1 MG tablet Take 2 tablets (2 mg total) by mouth daily with breakfast.   rosuvastatin (CRESTOR) 40 MG tablet Take 1 tablet (40 mg total) by mouth daily.   VITAMIN D PO Take by mouth daily in the afternoon.    Immunization History  Administered Date(s) Administered   Fluad Trivalent(High Dose 65+) 05/13/2023   HIB (PRP-T) 04/14/2018   Influenza Split 05/29/2011, 05/25/2013   Influenza,inj,Quad PF,6+ Mos 05/17/2018, 07/02/2020, 05/20/2022   Influenza,inj,quad, With Preservative 05/17/2014   Influenza-Unspecified 05/17/2018, 05/16/2019   Meningococcal B, OMV 07/06/2019   Meningococcal Mcv4o 07/06/2019, 07/02/2020   Moderna Sars-Covid-2 Vaccination 11/14/2019, 12/08/2019   Pneumococcal Conjugate-13 04/14/2018   Pneumococcal Polysaccharide-23 07/22/2016   Tdap 05/29/2011, 10/18/2014, 11/18/2020        Objective:     BP 110/68 (BP Location: Right Arm, Cuff Size: Large)   Pulse 93   Temp 98.1 F (  36.7 C)   Ht 5\' 3"  (1.6 m)   Wt 198 lb 3.2 oz (89.9 kg)   SpO2 95%   BMI 35.11 kg/m   SpO2: 95 % O2 Device: None (Room air)  GENERAL: Overweight woman, no acute distress, fully ambulatory.  No conversational dyspnea. HEAD: Normocephalic, atraumatic.  EYES: Pupils equal, round, reactive to light.  No scleral icterus.  MOUTH: Poor dentition, oral mucosa moist.  No thrush. NECK: Supple. No thyromegaly. Trachea midline. No JVD.  No adenopathy. PULMONARY: Good air entry bilaterally.  No  adventitious sounds noted. CARDIOVASCULAR: S1 and S2. Regular rate and rhythm.  No rubs, murmurs or gallops heard. ABDOMEN: Obese, otherwise benign. MUSCULOSKELETAL: No joint deformity, no clubbing, no edema.  NEUROLOGIC: No overt focal deficit, no gait disturbance, speech is fluent. SKIN: Intact,warm,dry.  Multiple petechiae noted.   PSYCH: Mood and behavior normal.   Ambulatory oxymetry was performed today:  At rest on room air oxygen saturation was 94%, the patient ambulated at a slow pace, completed 3 laps, O2 nadir 90%, moderate shortness of breath.  Resting heart rate was 87 bpm at maximum for this exercise 109 bpm.    Assessment & Plan:     ICD-10-CM   1. Stage 3 severe COPD by GOLD classification (HCC)  J44.9 AMB referral to pulmonary rehabilitation   Continue Breztri 2 puffs twice a day Continue as needed albuterol Add Ohtuvayre 3 mg twice daily    2. Asthma-COPD overlap syndrome  J44.89    Continue Breztri 2 puffs twice a day    3. Lung nodule seen on imaging study  R91.1    Has follow-up CT scheduled for October 2024    4. Undifferentiated connective tissue disease (HCC)  M35.9    This issue adds complexity to her management Follows with rheumatology On Plaquenil and prednisone     Orders Placed This Encounter  Procedures   AMB referral to pulmonary rehabilitation    Referral Priority:   Routine    Referral Type:   Consultation    Number of Visits Requested:   1   Meds ordered this encounter  Medications   Ensifentrine (OHTUVAYRE) 3 MG/2.5ML SUSP    Sig: Take 3 mg by nebulization 2 (two) times daily.   Will see the patient in follow-up in 6 to 8 weeks time call sooner should any new problems arise.   Gailen Shelter, MD Advanced Bronchoscopy PCCM [facility]    *This note was dictated using voice recognition software/Dragon.  Despite best efforts to proofread, errors can occur which can change the meaning. Any transcriptional errors  that result from this process are unintentional and may not be fully corrected at the time of dictation.

## 2023-05-26 NOTE — Telephone Encounter (Signed)
Patient was seen in the office today. Dr. Jayme Cloud has ordered [facility] for the patient.  She has filled out the forms.   Waiting on office note from 9/25. Will fax once that is complete.

## 2023-05-26 NOTE — Patient Instructions (Signed)
We will check on your follow-up CT in October.  We are making a referral to pulmonary rehabilitation.  Have ordered an new medication called Sharyn Blitz this is given through the nebulizer twice a day we have sent in paperwork to get you approved for it.  We will see her in follow-up in 6 to 8 weeks time call sooner should any new problems arise.

## 2023-06-01 ENCOUNTER — Ambulatory Visit
Admission: RE | Admit: 2023-06-01 | Discharge: 2023-06-01 | Disposition: A | Discharge status: Home / Self Care | Payer: Medicare HMO | Source: Ambulatory Visit | Attending: Acute Care | Admitting: Acute Care

## 2023-06-01 ENCOUNTER — Encounter: Payer: Self-pay | Admitting: Oncology

## 2023-06-01 DIAGNOSIS — Z87891 Personal history of nicotine dependence: Secondary | ICD-10-CM

## 2023-06-01 DIAGNOSIS — R911 Solitary pulmonary nodule: Secondary | ICD-10-CM

## 2023-06-10 ENCOUNTER — Ambulatory Visit: Payer: Medicare HMO

## 2023-06-10 ENCOUNTER — Other Ambulatory Visit: Payer: Medicare HMO

## 2023-06-14 ENCOUNTER — Inpatient Hospital Stay: Payer: Medicare HMO | Attending: Oncology

## 2023-06-14 ENCOUNTER — Inpatient Hospital Stay: Payer: Medicare HMO

## 2023-06-14 VITALS — BP 104/63

## 2023-06-14 DIAGNOSIS — Z7952 Long term (current) use of systemic steroids: Secondary | ICD-10-CM | POA: Diagnosis not present

## 2023-06-14 DIAGNOSIS — N1831 Chronic kidney disease, stage 3a: Secondary | ICD-10-CM | POA: Insufficient documentation

## 2023-06-14 DIAGNOSIS — D5911 Warm autoimmune hemolytic anemia: Secondary | ICD-10-CM | POA: Insufficient documentation

## 2023-06-14 DIAGNOSIS — D591 Autoimmune hemolytic anemia, unspecified: Secondary | ICD-10-CM

## 2023-06-14 DIAGNOSIS — D631 Anemia in chronic kidney disease: Secondary | ICD-10-CM | POA: Insufficient documentation

## 2023-06-14 LAB — CBC WITH DIFFERENTIAL (CANCER CENTER ONLY)
Abs Immature Granulocytes: 0.02 10*3/uL (ref 0.00–0.07)
Basophils Absolute: 0.1 10*3/uL (ref 0.0–0.1)
Basophils Relative: 1 %
Eosinophils Absolute: 0.1 10*3/uL (ref 0.0–0.5)
Eosinophils Relative: 3 %
HCT: 29.9 % — ABNORMAL LOW (ref 36.0–46.0)
Hemoglobin: 9.3 g/dL — ABNORMAL LOW (ref 12.0–15.0)
Immature Granulocytes: 0 %
Lymphocytes Relative: 22 %
Lymphs Abs: 1 10*3/uL (ref 0.7–4.0)
MCH: 32.7 pg (ref 26.0–34.0)
MCHC: 31.1 g/dL (ref 30.0–36.0)
MCV: 105.3 fL — ABNORMAL HIGH (ref 80.0–100.0)
Monocytes Absolute: 0.4 10*3/uL (ref 0.1–1.0)
Monocytes Relative: 9 %
Neutro Abs: 3 10*3/uL (ref 1.7–7.7)
Neutrophils Relative %: 65 %
Platelet Count: 180 10*3/uL (ref 150–400)
RBC: 2.84 MIL/uL — ABNORMAL LOW (ref 3.87–5.11)
RDW: 15.1 % (ref 11.5–15.5)
WBC Count: 4.7 10*3/uL (ref 4.0–10.5)
nRBC: 0 % (ref 0.0–0.2)

## 2023-06-14 MED ORDER — EPOETIN ALFA 20000 UNIT/ML IJ SOLN
20000.0000 [IU] | Freq: Once | INTRAMUSCULAR | Status: AC
  Administered 2023-06-14: 20000 [IU] via SUBCUTANEOUS
  Filled 2023-06-14: qty 1

## 2023-06-14 MED ORDER — EPOETIN ALFA 20000 UNIT/ML IJ SOLN
20000.0000 [IU] | Freq: Once | INTRAMUSCULAR | Status: DC
  Filled 2023-06-14: qty 1

## 2023-06-21 ENCOUNTER — Other Ambulatory Visit: Payer: Self-pay

## 2023-06-21 DIAGNOSIS — Z87891 Personal history of nicotine dependence: Secondary | ICD-10-CM

## 2023-06-21 DIAGNOSIS — Z122 Encounter for screening for malignant neoplasm of respiratory organs: Secondary | ICD-10-CM

## 2023-06-23 ENCOUNTER — Other Ambulatory Visit: Payer: Self-pay | Admitting: Pulmonary Disease

## 2023-06-23 NOTE — Telephone Encounter (Signed)
Okay to refill 1 time with 1 refill then she needs to procure from her primary physician.

## 2023-06-23 NOTE — Telephone Encounter (Signed)
Dr. Jayme Cloud, please advise if okay to refill.

## 2023-07-07 ENCOUNTER — Ambulatory Visit
Admit: 2023-07-07 | Discharge: 2023-07-08 | Attending: Student in an Organized Health Care Education/Training Program | Primary: Student in an Organized Health Care Education/Training Program

## 2023-07-07 DIAGNOSIS — B181 Chronic viral hepatitis B without delta-agent: Principal | ICD-10-CM

## 2023-07-07 DIAGNOSIS — Z8619 Personal history of other infectious and parasitic diseases: Principal | ICD-10-CM

## 2023-07-07 DIAGNOSIS — D591 AIHA (autoimmune hemolytic anemia) (CMS-HCC): Principal | ICD-10-CM

## 2023-07-07 DIAGNOSIS — R768 Other specified abnormal immunological findings in serum: Principal | ICD-10-CM

## 2023-07-07 MED ORDER — ENTECAVIR 0.5 MG TABLET
ORAL_TABLET | 1 refills | 0 days | Status: CP
Start: 2023-07-07 — End: ?

## 2023-07-08 ENCOUNTER — Inpatient Hospital Stay: Payer: Medicare HMO

## 2023-07-08 ENCOUNTER — Inpatient Hospital Stay: Payer: Medicare HMO | Attending: Oncology

## 2023-07-08 VITALS — BP 104/57

## 2023-07-08 DIAGNOSIS — N1831 Chronic kidney disease, stage 3a: Secondary | ICD-10-CM | POA: Insufficient documentation

## 2023-07-08 DIAGNOSIS — D631 Anemia in chronic kidney disease: Secondary | ICD-10-CM | POA: Diagnosis present

## 2023-07-08 DIAGNOSIS — Z7952 Long term (current) use of systemic steroids: Secondary | ICD-10-CM | POA: Insufficient documentation

## 2023-07-08 DIAGNOSIS — D591 Autoimmune hemolytic anemia, unspecified: Secondary | ICD-10-CM

## 2023-07-08 DIAGNOSIS — D5911 Warm autoimmune hemolytic anemia: Secondary | ICD-10-CM | POA: Insufficient documentation

## 2023-07-08 LAB — CBC WITH DIFFERENTIAL (CANCER CENTER ONLY)
Abs Immature Granulocytes: 0.02 10*3/uL (ref 0.00–0.07)
Basophils Absolute: 0.1 10*3/uL (ref 0.0–0.1)
Basophils Relative: 1 %
Eosinophils Absolute: 0.2 10*3/uL (ref 0.0–0.5)
Eosinophils Relative: 3 %
HCT: 30.7 % — ABNORMAL LOW (ref 36.0–46.0)
Hemoglobin: 9.7 g/dL — ABNORMAL LOW (ref 12.0–15.0)
Immature Granulocytes: 0 %
Lymphocytes Relative: 14 %
Lymphs Abs: 0.8 10*3/uL (ref 0.7–4.0)
MCH: 33.3 pg (ref 26.0–34.0)
MCHC: 31.6 g/dL (ref 30.0–36.0)
MCV: 105.5 fL — ABNORMAL HIGH (ref 80.0–100.0)
Monocytes Absolute: 0.4 10*3/uL (ref 0.1–1.0)
Monocytes Relative: 8 %
Neutro Abs: 3.9 10*3/uL (ref 1.7–7.7)
Neutrophils Relative %: 74 %
Platelet Count: 151 10*3/uL (ref 150–400)
RBC: 2.91 MIL/uL — ABNORMAL LOW (ref 3.87–5.11)
RDW: 14.6 % (ref 11.5–15.5)
WBC Count: 5.3 10*3/uL (ref 4.0–10.5)
nRBC: 0 % (ref 0.0–0.2)

## 2023-07-08 LAB — CMP (CANCER CENTER ONLY)
ALT: 10 U/L (ref 0–44)
AST: 16 U/L (ref 15–41)
Albumin: 4.1 g/dL (ref 3.5–5.0)
Alkaline Phosphatase: 49 U/L (ref 38–126)
Anion gap: 7 (ref 5–15)
BUN: 27 mg/dL — ABNORMAL HIGH (ref 8–23)
CO2: 28 mmol/L (ref 22–32)
Calcium: 9.4 mg/dL (ref 8.9–10.3)
Chloride: 105 mmol/L (ref 98–111)
Creatinine: 1.45 mg/dL — ABNORMAL HIGH (ref 0.44–1.00)
GFR, Estimated: 40 mL/min — ABNORMAL LOW (ref >60)
Glucose, Bld: 102 mg/dL — ABNORMAL HIGH (ref 70–99)
Potassium: 4.2 mmol/L (ref 3.5–5.1)
Sodium: 140 mmol/L (ref 135–145)
Total Bilirubin: 0.7 mg/dL (ref <1.2)
Total Protein: 6.7 g/dL (ref 6.5–8.1)

## 2023-07-08 MED ORDER — EPOETIN ALFA 20000 UNIT/ML IJ SOLN
20000.0000 [IU] | Freq: Once | INTRAMUSCULAR | Status: AC
  Administered 2023-07-08: 20000 [IU] via INTRAVENOUS
  Filled 2023-07-08: qty 1

## 2023-07-09 LAB — HAPTOGLOBIN: Haptoglobin: <10 mg/dL — ABNORMAL LOW (ref 37–355)

## 2023-07-23 ENCOUNTER — Encounter: Payer: Self-pay | Admitting: Nurse Practitioner

## 2023-07-23 ENCOUNTER — Ambulatory Visit
Admission: RE | Admit: 2023-07-23 | Discharge: 2023-07-23 | Disposition: A | Discharge status: Home / Self Care | Payer: Medicare HMO | Source: Ambulatory Visit | Attending: Nurse Practitioner

## 2023-07-23 ENCOUNTER — Ambulatory Visit: Payer: Medicare HMO | Admitting: Nurse Practitioner

## 2023-07-23 ENCOUNTER — Telehealth (INDEPENDENT_AMBULATORY_CARE_PROVIDER_SITE_OTHER): Payer: Self-pay

## 2023-07-23 VITALS — BP 124/68 | HR 87 | Temp 97.0°F | Ht 63.0 in | Wt 195.6 lb

## 2023-07-23 DIAGNOSIS — R918 Other nonspecific abnormal finding of lung field: Secondary | ICD-10-CM

## 2023-07-23 DIAGNOSIS — M79661 Pain in right lower leg: Secondary | ICD-10-CM | POA: Diagnosis present

## 2023-07-23 DIAGNOSIS — Z87891 Personal history of nicotine dependence: Secondary | ICD-10-CM

## 2023-07-23 DIAGNOSIS — R600 Localized edema: Secondary | ICD-10-CM

## 2023-07-23 DIAGNOSIS — J449 Chronic obstructive pulmonary disease, unspecified: Secondary | ICD-10-CM | POA: Diagnosis not present

## 2023-07-23 NOTE — Progress Notes (Signed)
Agree with the details of the visit as noted by Micheline Maze, NP.  Gailen Shelter, MD Advanced Bronchoscopy PCCM [facility]

## 2023-07-23 NOTE — Patient Instructions (Signed)
Continue Albuterol inhaler 2 puffs or 3 mL neb every 6 hours as needed for shortness of breath or wheezing. Notify if symptoms persist despite rescue inhaler/neb use.  Continue Breztri 2 puffs twice daily.  Brush tongue and rinse mouth afterwards Continue Ohtuvayre 2.5 mL neb Twice daily   Lung cancer screening CT next October 2025  Given your leg swelling/pain and recent car travel, I have ordered an ultrasound of your right leg as I am concerned you may have a blood clot. If symptoms worsen or you start having trouble with your breathing, you need to go to the emergency room  Contact your vascular doctor as well to schedule an appointment   Follow up in 6 weeks with Dr. Jayme Cloud or Katie Annitta Fifield,NP. If symptoms do not improve or worsen, please contact office for sooner follow up or seek emergency care.

## 2023-07-23 NOTE — Progress Notes (Signed)
@Patient  ID: Tina Page, female    DOB: May 29, 1956, 67 y.o.   MRN: [id number]  Chief Complaint  Patient presents with   Follow-up    DOE. Some wheezing. Some dry cough.     Referring provider: Abram Sander, MD  HPI: 67 year old female, former smoker followed for severe COPD. She is a patient of Dr. Jayme Cloud and last seen in office 05/26/2023. Past medical history significant for atherosclerosis, PAD, HTN, chronic hepatitis B, hypothyroid, CKD, anemia, HLD, mixed CTD.  TEST/EVENTS:  11/18/2017 PFT: FVC 63, FEV1 51, ratio 65.  Moderate obstruction without reversibility.  Normal lung volumes.  Moderate to severe diffusing defect 02/16/2018 CT chest: Complete resolution of spiculated nodules right middle lobe.  Stable 5 mm right perihilar nodule since 2019 but not present in 2017 06/27/2019 CT chest: Previous right middle lobe nodule almost completely resolved, residual scarring.  Persistent consolidation in medial right middle lobe and lingula, in keeping with atypical infection.  Interval increase in size of small nodule and right middle lobe, 4 mm previously 2 mm.  Unchanged tiny biapical pulmonary nodules.  Consider sequela of nonspecific infection or inflammation. 12/02/2021 LDCT chest: New right middle lobe pulmonary nodule with aggressive appearance categorized as lung RADS 4 BS, suspicious.  Mild diffuse bronchial wall thickening and mild centrilobular and paraseptal emphysema. 12/22/2021 PET: Right middle lobe pulmonary nodule appears to be resolving, no FDG avid, consistent with infectious/inflammatory etiology. 03/05/2022 LDCT chest: Lung RADS 3, probably benign.  New 5.9 mm right lower lobe nodule.  Short-term follow-up in 6 months. 07/07/2022 PFT: FVC 43, FEV1 36, ratio 64.  Severe obstructive airway disease with reversibility.  Air trapping.  Moderate to severe diffusing capacity impairment. 09/07/2022 LDCT chest: Lung RADS 4A, suspicious.  Minimally progressive irregular nodule in  right lower lobe measuring 6.1 mm.  Suggest follow-up in 3 months. 03/09/2023 LDCT chest: Lung RADS 4A, suspicious.  7.2 mm.  Mildly progressive however still below PET sensitivity.  Additional repeat dose CT in 3 to 6 months. 05/25/2023 PFT: FVC 42, FEV1 35, ratio 65.  Severe obstructive airway disease with reversibility.  Air trapping.  Severe diffusion defect  05/26/2023: OV with Dr. Jayme Cloud.  Presents for scheduled follow-up.  Continues to have significant dyspnea on exertion, wheezing and chest congestion that is difficult to expectorate.  No cough.  No hemoptysis.  Average use of rescue inhaler is twice a day.  Had a CT chest for follow-up March 09, 2023 which shows that the nodule of interest in the right lower lobe is mildly progressive at 7.2 mm, previously noted to be 6.1 mm.  Still below PET threshold.  She has follow-up CT scheduled June 01, 2023.  PFTs showed FEV1 of 35%, FVC of 42% and ratio of 65%.  Significant bronchodilator response.  Diffusion capacity was severely impaired.  Testing primarily unchanged from November 2023 but has had a decline since 2021.  Walk test without desaturations on room air.  SpO2 low was 90%.  Advised to continue [place].  Add Ohtuvayre nebs twice daily.  Attend follow-up CT scan.  07/23/2023: Today - follow up Discussed the use of AI scribe software for clinical note transcription with the patient, who gave verbal consent to proceed.  History of Present Illness   Tina Page, a patient with a history of severe COPD, was started on Ohtuvayre during the last visit. The patient reports that the medication has been beneficial, reducing the severity of shortness of breath. The patient still experiences  occasional shortness of breath, particularly during activities such as distance walking or uphill climbing. However, activities around the house are generally well-tolerated. The patient also reports wheezing upon heavy exertion. The patient uses albuterol, a rescue  inhaler, primarily in the morning or when experiencing breathlessness during outdoor activities. On average 1-2 times a day, which is normal for her. The patient also continues to use Breztri. No significant cough, chest congestion, fevers, chills, night sweats, orthopnea, weight loss, anorexia.   The patient has been on prednisone for over a year, currently at a dose of 2 mg per day, due to a diagnosis of hemolytic anemia. The patient has been gradually tapered down from a higher dose. She's scheduled to see hematology in December.   The patient has a history of lung nodules, which were found to be stable on a recent CT scan. She will have a repeat scan in October 2025 with lung cancer screening program.  Recently, the patient has noticed increased swelling in her right leg, more than usual. 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 She's currently on plavix but no anticoagulation therapy. She has been experiencing tenderness in the area. The patient also reports a red area to the back of her calf/heel below the pain/swelling. She did reach out to her vascular doctor but has not heard back. She did recently travel to [place] with her church group last week. They drove there, which was a 7 hour car ride there and 7 hours back. Symptoms started after she got home. No increased dyspnea, hemoptysis, hypoxia, tachycardia.       Allergies  Allergen Reactions   Ciprofloxacin Swelling    Facial swelling following single oral dose.    Codeine Anaphylaxis   Nsaids Other (See Comments)    Patient has Hep C.     Immunization History  Administered Date(s) Administered   Fluad Trivalent(High Dose 65+) 05/13/2023   HIB (PRP-T) 04/14/2018   Influenza Split 05/29/2011, 05/25/2013   Influenza,inj,Quad PF,6+ Mos 05/17/2018, 07/02/2020, 05/20/2022   Influenza,inj,quad, With Preservative 05/17/2014   Influenza-Unspecified 05/17/2018, 05/16/2019   Meningococcal B, OMV 07/06/2019    Meningococcal Mcv4o 07/06/2019, 07/02/2020   Moderna Sars-Covid-2 Vaccination 11/14/2019, 12/08/2019   Pfizer(Comirnaty)Fall Seasonal Vaccine 12 years and older 07/07/2023   Pneumococcal Conjugate-13 04/14/2018   Pneumococcal Polysaccharide-23 07/22/2016   Tdap 05/29/2011, 10/18/2014, 11/18/2020    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/23/2017   Post PTCA 07/12/2016   iliac stents bilaterally 2017   Thrombocytopenia (HCC) 10/04/2016   Wears dentures    full upper and lower    Tobacco History: Social History   Tobacco Use  Smoking Status Former   Current packs/day: 0.00   Average packs/day: 1.3 packs/day for 47.0 years (58.8 ttl pk-yrs)   Types: Cigarettes   Start date: 07/11/1969   Quit date: 07/11/2016   Years since quitting: 7.0  Smokeless Tobacco Never   Counseling given: Not Answered   Outpatient Medications  Prior to Visit  Medication Sig Dispense Refill   albuterol (VENTOLIN HFA) 108 (90 Base) MCG/ACT inhaler SMARTSIG:2 Puff(s) By Mouth Every 4 Hours PRN     allopurinol (ZYLOPRIM) 100 MG tablet TAKE 1 TABLET BY MOUTH EVERY DAY 30 tablet 5   Budeson-Glycopyrrol-Formoterol (BREZTRI AEROSPHERE) 160-9-4.8 MCG/ACT AERO Inhale 2 puffs into the lungs in the morning and at bedtime. 11.8 g 0   CALCIUM PO Take by mouth daily at 12 noon.     clopidogrel (PLAVIX) 75 MG tablet Take 1 tablet (75 mg total) by mouth daily. 30 tablet 5   cyanocobalamin 500 MCG tablet Take 500 mcg by mouth  daily.     Ensifentrine (OHTUVAYRE) 3 MG/2.5ML SUSP Take 3 mg by nebulization 2 (two) times daily.     entecavir (BARACLUDE) 0.5 MG tablet Take 0.5 mg by mouth every other day.     hydroxychloroquine (PLAQUENIL) 200 MG tablet Take 200 mg by mouth 2 (two) times daily.     levothyroxine (SYNTHROID, LEVOTHROID) 75 MCG tablet Take 75 mcg by mouth daily before breakfast.      lisinopril-hydrochlorothiazide (ZESTORETIC) 20-25 MG tablet Take 1 tablet by mouth daily. 90 tablet 3   pantoprazole (PROTONIX) 40 MG tablet Take 1 tablet by mouth once daily 30 tablet 1   predniSONE (DELTASONE) 1 MG tablet Take 2 tablets (2 mg total) by mouth daily with breakfast. 180 tablet 0   rosuvastatin (CRESTOR) 40 MG tablet Take 1 tablet (40 mg total) by mouth daily. 90 tablet 3   VITAMIN D PO Take by mouth daily in the afternoon.     No facility-administered medications prior to visit.     Review of Systems:   Constitutional: No weight loss or gain, night sweats, fevers, chills, fatigue, or lassitude. HEENT: No headaches, difficulty swallowing, tooth/dental problems, or sore throat. No sneezing, itching, ear ache, nasal congestion, or post nasal drip CV:  +RLE swelling and calf pain. No chest pain, orthopnea, PND, anasarca, dizziness, palpitations, syncope Resp: +baseline shortness of breath with exertion; occasional wheeze; rare dry cough (improved). No excess mucus or change in color of mucus. No hemoptysis. No chest wall deformity GI:  No heartburn, indigestion, abdominal pain, nausea, vomiting, diarrhea, change in bowel habits, loss of appetite, bloody stools.  GU: No dysuria, change in color of urine, urgency or frequency.  No flank pain, no hematuria  Skin: No rash, lesions, ulcerations MSK:  No joint pain or swelling.  No decreased range of motion.  No back pain. Neuro: No dizziness or lightheadedness.  Psych: No depression or anxiety. Mood stable.     Physical Exam:  BP 124/68 (BP Location: Right Arm,  Cuff Size: Normal)   Pulse 87   Temp (!) 97 F (36.1 C)   Ht 5\' 3"  (1.6 m)   Wt 195 lb 9.6 oz (88.7 kg)   SpO2 97%   BMI 34.65 kg/m   GEN: Pleasant, interactive, well-kempt; obese; in no acute distress HEENT:  Normocephalic and atraumatic. PERRLA. Sclera white. Nasal turbinates pink, moist and patent bilaterally. No rhinorrhea present. Oropharynx pink and moist, without exudate or edema. No lesions, ulcerations, or postnasal drip.  NECK:  Supple w/ fair ROM. No JVD present. Normal carotid impulses w/o bruits. Thyroid symmetrical with no goiter or nodules palpated. No lymphadenopathy.   CV: RRR, no m/r/g. Pulses intact, +2 bilaterally. No cyanosis, pallor or clubbing. RLE calf edema and erythema. Tenderness upon palpation  PULMONARY:  Unlabored, regular breathing. Diminished bilaterally A&P w/o wheezes/rales/rhonchi. No accessory  muscle use.  GI: BS present and normoactive. Soft, non-tender to palpation. No organomegaly or masses detected.  MSK: No erythema, warmth or tenderness to joints. Cap refil <2 sec all extrem.  Neuro: A/Ox3. No focal deficits noted.   Skin: Warm, no lesions or rashe Psych: Normal affect and behavior. Judgement and thought content appropriate.     Lab Results:  CBC    Component Value Date/Time   WBC 5.3 07/08/2023 1105   WBC 7.3 01/09/2023 1932   RBC 2.91 (L) 07/08/2023 1105   HGB 9.7 (L) 07/08/2023 1105   HGB 9.0 (L) 02/18/2018 1204   HCT 30.7 (L) 07/08/2023 1105   HCT 46.5 06/11/2014 1732   PLT 151 07/08/2023 1105   PLT 127 (L) 06/11/2014 1732   MCV 105.5 (H) 07/08/2023 1105   MCV 92 06/11/2014 1732   MCH 33.3 07/08/2023 1105   MCHC 31.6 07/08/2023 1105   RDW 14.6 07/08/2023 1105   RDW 14.5 06/11/2014 1732   LYMPHSABS 0.8 07/08/2023 1105   MONOABS 0.4 07/08/2023 1105   EOSABS 0.2 07/08/2023 1105   BASOSABS 0.1 07/08/2023 1105    BMET    Component Value Date/Time   NA 140 07/08/2023 1105   NA 142 06/11/2014 1732   K 4.2 07/08/2023 1105    K 3.1 (L) 06/11/2014 1732   CL 105 07/08/2023 1105   CL 103 06/11/2014 1732   CO2 28 07/08/2023 1105   CO2 32 06/11/2014 1732   GLUCOSE 102 (H) 07/08/2023 1105   GLUCOSE 100 (H) 06/11/2014 1732   BUN 27 (H) 07/08/2023 1105   BUN 15 06/11/2014 1732   CREATININE 1.45 (H) 07/08/2023 1105   CREATININE 0.73 06/11/2014 1732   CALCIUM 9.4 07/08/2023 1105   CALCIUM 8.9 06/11/2014 1732   GFRNONAA 40 (L) 07/08/2023 1105   GFRNONAA >60 06/11/2014 1732   GFRAA 58 (L) 12/28/2019 1325   GFRAA >60 06/11/2014 1732    BNP No results found for: "BNP"   Imaging:  No results found.  albuterol (PROVENTIL) (2.5 MG/3ML) 0.083% nebulizer solution 2.5 mg     Date Action Dose Route User   05/25/2023 0853 Given 2.5 mg Nebulization Fleet Contras, RRT      epoetin alfa (EPOGEN) injection 20,000 Units     Date Action Dose Route User   06/14/2023 1126 Given 20,000 Units Subcutaneous (Left Arm) Keitha Butte, RN      epoetin alfa (EPOGEN) injection 20,000 Units     Date Action Dose Route User   07/08/2023 1145 Given 20,000 Units Intravenous (Right Arm) Love, Benetta Spar, CMA          Latest Ref Rng & Units 05/25/2023    9:17 AM 07/07/2022   10:42 AM  PFT Results  FVC-Pre L 1.29  1.34   FVC-Predicted Pre % 42  43   FVC-Post L 1.49  1.59   FVC-Predicted Post % 48  51   Pre FEV1/FVC % % 65  64   Post FEV1/FCV % % 67  64   FEV1-Pre L 0.84  0.86   FEV1-Predicted Pre % 35  36   FEV1-Post L 0.99  1.02   DLCO uncorrected ml/min/mmHg 6.86  9.31   DLCO UNC% % 35  48   DLVA Predicted % 47  56   TLC L 4.81  4.50   TLC % Predicted % 98  91   RV % Predicted % 160  136     No results found for: "NITRICOXIDE"  Assessment & Plan:     Chronic Obstructive Pulmonary Disease (COPD) Severe COPD. Reports improvement in dyspnea with new inhaled nebulized medication. Tolerating Ohtuvayre well without side effects. High symptom burden but appears compensated on current regimen.  Continues on [place]. No significant cough or chest congestion. CT scan for lung cancer screening showed stable nodules. Encouraged to remain active. Action plan in place.  - Continue Ohtuvayre nebulized medication - Continue albuterol rescue inhaler as needed - Continue Breztri 2 puffs bid. Oral hygiene reviewed - Repeat CT scan in October 2025  Leg Swelling and Tenderness Reports worsening leg swelling and calf to heel tenderness. History of stents in the legs following previous DVT in 2007. Currently on Plavix. Recent long car ride travel with prolonged sitting. Concern for possible DVT. Discussed the need for a STAT ultrasound to rule out DVT due to history and current symptoms. No worsening respiratory symptoms - Order ultrasound of the leg to rule out DVT - Schedule appointment with vascular specialist - Monitor for results and follow up - Strict ED precautions   Pulmonary nodules/Former smoker Stable on recent imaging. Lung RADS 2. Repeat CT chest with lung cancer screening program in October 2025. - LDCT chest 05/2024   Advised if symptoms do not improve or worsen, to please contact office for sooner follow up or seek emergency care.   I spent 38 minutes of dedicated to the care of this patient on the date of this encounter to include pre-visit review of records, face-to-face time with the patient discussing conditions above, post visit ordering of testing, clinical documentation with the electronic health record, making appropriate referrals as documented, and communicating necessary findings to members of the patients care team.  Noemi Chapel, NP 07/23/2023  Pt aware and understands NP's role.

## 2023-07-23 NOTE — Telephone Encounter (Signed)
Patient left a message stating that she has a bulging vein on the right heel that is tender to touch and achy. Patient notice the bulging vein last week. Patient provider has order dvt study. Please Advise

## 2023-07-23 NOTE — Telephone Encounter (Signed)
Patient will keep ultrasound appointment as schedule on 11/27

## 2023-07-23 NOTE — Telephone Encounter (Signed)
Well, it appears she has a DVT study ordered for 11/27.  She can do that study as ordered or if she wants [name] to do it, we can see if we have sooner ultrasound only availability

## 2023-08-12 ENCOUNTER — Inpatient Hospital Stay: Payer: Medicare HMO | Admitting: Oncology

## 2023-08-12 ENCOUNTER — Inpatient Hospital Stay: Payer: Medicare HMO

## 2023-08-12 ENCOUNTER — Inpatient Hospital Stay: Payer: Medicare HMO | Attending: Oncology

## 2023-08-12 ENCOUNTER — Encounter: Payer: Self-pay | Admitting: Oncology

## 2023-08-12 VITALS — BP 152/65 | HR 82 | Temp 96.2°F | Resp 18 | Wt 196.5 lb

## 2023-08-12 DIAGNOSIS — Z87891 Personal history of nicotine dependence: Secondary | ICD-10-CM | POA: Insufficient documentation

## 2023-08-12 DIAGNOSIS — D591 Autoimmune hemolytic anemia, unspecified: Secondary | ICD-10-CM | POA: Diagnosis not present

## 2023-08-12 DIAGNOSIS — D631 Anemia in chronic kidney disease: Secondary | ICD-10-CM | POA: Insufficient documentation

## 2023-08-12 DIAGNOSIS — D5911 Warm autoimmune hemolytic anemia: Secondary | ICD-10-CM | POA: Diagnosis present

## 2023-08-12 DIAGNOSIS — Z7952 Long term (current) use of systemic steroids: Secondary | ICD-10-CM | POA: Insufficient documentation

## 2023-08-12 DIAGNOSIS — R768 Other specified abnormal immunological findings in serum: Secondary | ICD-10-CM

## 2023-08-12 DIAGNOSIS — N1831 Chronic kidney disease, stage 3a: Secondary | ICD-10-CM | POA: Diagnosis present

## 2023-08-12 DIAGNOSIS — N1832 Chronic kidney disease, stage 3b: Secondary | ICD-10-CM

## 2023-08-12 LAB — CBC WITH DIFFERENTIAL (CANCER CENTER ONLY)
Abs Immature Granulocytes: 0.04 10*3/uL (ref 0.00–0.07)
Basophils Absolute: 0.1 10*3/uL (ref 0.0–0.1)
Basophils Relative: 1 %
Eosinophils Absolute: 0.1 10*3/uL (ref 0.0–0.5)
Eosinophils Relative: 2 %
HCT: 33.7 % — ABNORMAL LOW (ref 36.0–46.0)
Hemoglobin: 10.4 g/dL — ABNORMAL LOW (ref 12.0–15.0)
Immature Granulocytes: 1 %
Lymphocytes Relative: 18 %
Lymphs Abs: 0.9 10*3/uL (ref 0.7–4.0)
MCH: 32.9 pg (ref 26.0–34.0)
MCHC: 30.9 g/dL (ref 30.0–36.0)
MCV: 106.6 fL — ABNORMAL HIGH (ref 80.0–100.0)
Monocytes Absolute: 0.4 10*3/uL (ref 0.1–1.0)
Monocytes Relative: 8 %
Neutro Abs: 3.4 10*3/uL (ref 1.7–7.7)
Neutrophils Relative %: 70 %
Platelet Count: 164 10*3/uL (ref 150–400)
RBC: 3.16 MIL/uL — ABNORMAL LOW (ref 3.87–5.11)
RDW: 15.2 % (ref 11.5–15.5)
WBC Count: 4.8 10*3/uL (ref 4.0–10.5)
nRBC: 0 % (ref 0.0–0.2)

## 2023-08-12 LAB — CMP (CANCER CENTER ONLY)
ALT: 12 U/L (ref 0–44)
AST: 20 U/L (ref 15–41)
Albumin: 4.1 g/dL (ref 3.5–5.0)
Alkaline Phosphatase: 49 U/L (ref 38–126)
Anion gap: 8 (ref 5–15)
BUN: 20 mg/dL (ref 8–23)
CO2: 28 mmol/L (ref 22–32)
Calcium: 8.9 mg/dL (ref 8.9–10.3)
Chloride: 106 mmol/L (ref 98–111)
Creatinine: 1.31 mg/dL — ABNORMAL HIGH (ref 0.44–1.00)
GFR, Estimated: 45 mL/min — ABNORMAL LOW (ref >60)
Glucose, Bld: 97 mg/dL (ref 70–99)
Potassium: 3.8 mmol/L (ref 3.5–5.1)
Sodium: 142 mmol/L (ref 135–145)
Total Bilirubin: 1 mg/dL (ref <1.2)
Total Protein: 6.6 g/dL (ref 6.5–8.1)

## 2023-08-12 MED ORDER — EPOETIN ALFA 20000 UNIT/ML IJ SOLN
20000.0000 [IU] | Freq: Once | INTRAMUSCULAR | Status: AC
  Administered 2023-08-12: 20000 [IU] via INTRAVENOUS
  Filled 2023-08-12: qty 1

## 2023-08-12 MED ORDER — PREDNISONE 1 MG PO TABS: 1.0000 mg | ORAL_TABLET | Freq: Every day | ORAL | 0 refills | Status: DC

## 2023-08-12 NOTE — Progress Notes (Signed)
Hematology/Oncology Progress note [phone] [phone]      Clinic Day:  08/12/2023  ASSESSMENT & PLAN:   Autoimmune hemolytic anemia (HCC) LDH is normal, adequate B12 and folate level, normal iron panel.  There is increased reticulocyte %-5%.  Cold agglutinin titer negative. Bilirubin is normal. 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  12/22/21 Negative PET scan  05/25/22 Bone marrow biopsy did not reveal any lymphoproliferative diseases. Mild hypocellular marrow, erythroid hyperplasia, mild evidence of spherocytosis, consistent with hemolysis.  Labs are reviewed and discussed with patient. Continue prednisone, decrease to 1 mg daily for 3 weeks and then stop S/p Rituximab weekly x 4  Hemoglobin is stable.   Anemia in chronic kidney disease (CKD) Lab Results  Component Value Date   HGB 10.4 (L) 08/12/2023   TIBC 314 05/14/2022   IRONPCTSAT 26 05/14/2022   FERRITIN 329 (H) 05/14/2022    Recommend Epogen 20,000 units monthly if Hb <11  CKD (chronic kidney disease) stage 3, GFR 30-59 ml/min (HCC) Encourage oral hydration.  Avoid nephrotoxins.    Hepatitis B core antibody positive History of hepatitis B previously on entecavir after her previous rituximab treatments.  Recommend patient to continue Entecavir 0.5mg  Per ID recommendation  for GFR >=30, take every other day For GFR <30, take every 72 hours.     Orders Placed This Encounter  Procedures   CBC with Differential (Cancer Center Only)    Standing Status:   Future    Expected Date:   09/09/2023    Expiration Date:   08/11/2024   CBC with Differential (Cancer Center Only)    Standing Status:   Future    Expected Date:   10/07/2023    Expiration Date:   08/11/2024   Basic Metabolic Panel - Cancer Center Only    Standing Status:   Future    Expected Date:    10/07/2023    Expiration Date:   08/11/2024   CBC with Differential (Cancer Center Only)    Standing Status:   Future    Expected Date:   11/10/2023    Expiration Date:   08/11/2024   CMP (Cancer Center only)    Standing Status:   Future    Expected Date:   11/10/2023    Expiration Date:   08/11/2024   Basic Metabolic Panel - Cancer Center Only    Standing Status:   Future    Expected Date:   09/09/2023    Expiration Date:   08/11/2024    Follow up  lab in 4 weeks + Epogen -cbc  8 weeks, lab + Epogen - cbc cmp haptoglobin 3 months ab MD + Epogen - cbc cmp   All questions were answered. The patient knows to call the clinic with any problems, questions or concerns.  [name], MD, PhD [facility] Hematology Oncology 08/12/2023   Chief Complaint: Tina Page is a 67 y.o. 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  Reticulocyte count was 11.3%.  Ferritin was 562.  Iron studies revealed a saturation of 20% and a TIBC of 214 (low).  B12  was 231 (low normal).  Folate was 42.   Peripheral smear revealed rouleaux formation.    Additional testing included the following + studies:  hepatitis C antibody, hepatitis B core antibody, CMV IgM, and EBV VCA (IgM and IgG).  Hepatitis B by PCR was negative.  Hepatitis C RNA was negative.  Mycoplasma pneumonia IgM was negative.  Reticulocyte count was 11.3% (high) indicating appropriate marrow response.  C3 and C4 were normal.  Cold agglutinin titer was negative x 2.  Negative studies included:  ANA, hepatitis B surface antigen, SPEP, and free light chain ratio.   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  MMA was initially 330 (normal).  Repeat MMA was elevated on 08/01/2016 confirming B12 deficiency.  Hepatitis B surface antibody was positive and hepatitis B surface antigen was negative on 08/06/2016.   07/07/2016  Chest, abdomen, and pelvis CT angiogram on revealed moderate diffuse atherosclerotic vascular disease of the abdominal aorta with severe stenosis of the left common iliac artery with suspected short segment occlusion. There was no adenopathy.  Spleen was normal.   04/15/2017 Abd US revealed a normal spleen and sludge in the gallbladder.  The liver was echogenic consistent fatty infiltration and/or hepatocellular disease.  For hemolytic anemia treatments, in 2017, She has received multiple units of  warmed PRBCs to date.  08/01/2016-03/12/2017, steroid treatment with prednisone.  She is also on folic acid 1 mg daily. Borderline low vitamin B12 level on 07/09/2016.  Patient has been on oral vitamin B12 supplementation.  10/12/2017  positive warm autoantibody.  LDH and bilirubin were normal.   She began prednisone 10 mg on 12/07/2017 (discontinued 03/2018).  She received Rituxan weekly x 4 (last dose 06/16/2018).  Entecavir was discontinued on 07/06/2019.  06/12/2020 developed recurrent anemia.  Hematocrit was 27.6, hemoglobin 8.4, MCV 104.5, platelets 138,000, WBC 3,400 (ANC 2,300). Creatinine was 1.17 (CrCl 50 ml/min).  Positive warm autoantibody  Reticulocyte count was 7.1%.  LDH was 204 (98-192).  06/16/2020, began prednisone 1 mg/kilogram treatments, 07/24/2020, prednisone was tapered down to 20 mg.  She received Rituxan 1000 mg on day 1 and 15 (07/15/2020 and 07/29/2020).  She began entecavir on 06/28/2020.  Prednisone was tapered off.  She was on Septra for PCP prophylaxis which is now currently on hold    Other medical problems 07/10/2016.  She underwent PTCA and stent placement in right and left common iliac arteries and left external iliac artery. She is on Plavix.    03/19/2015 EGD revealed gastritis in the body and antrum.  Colonoscopy revealed one hyperplastic polyp.  Repeat EGD on 07/08/2016 was normal.  No evidence of bleeding.  History of she developed peri-oral and intranasal herpes simplex-1.  She was treated with valacyclovir and doxycycline on 10/19/2016.   10/26/2017. She developed flu like symptoms.  Symptoms included cough, myalgias, and fever (tmax unknown).  She was prescribed Mucinex, Tamiflu, and amoxicillin.  She only took amoxicillin x 5 days.  She developed increased liver function tests on 11/02/2017. CMV IgG was positive.  EBV VCA IgG, NA IgG, early antigen antibody IgG were elevated.  EBV VCA IgM was < 36.  Testing was c/w a convalescence/past infection or reactivated infection.  LFTs normalized on 11/15/2017.  Smooth muscle antibody was 39 (high) on 11/10/2017 and 34 (high) on 11/30/2017.     Chest CT on 11/16/2017 revealed a 2.2 x 2.0 x 2.4 spiculated RIGHT middle lobe mass.  There was tiny nonspecific upper lobe pulmonary nodules greater  on RIGHT, largest 3 mm, of uncertain etiology.  There was additional 8 x 8 mm opacity in the posterior sulcus of the RIGHT lower lobe. PET scan on 11/22/2017 revealed a 2 cm hypermetabolic right middle lobe lung mass (SUV 3.4) consistent  with primary lung neoplasm.  There were no findings for mediastinal/hilar lymphadenopathy or metastatic disease.  There were areas of hypermetabolism involving the left oblique abdominal muscles and the anorectal junction. PFTs on 11/18/2017 revealed an FEV1 of 1.22 liters (51%).  DLCO adj was 6.8 mL/mmHg/min (32%).   ENB done on 12/07/2017. Cytology was negative for malignancy. Pathology demonstrated organizing pneumonia and chronic bronchitis. Pathologist commented that organizing pneumonia spanned about 2 mm in one fragment. Cases of focal organizing pneumonia with hypermetabolism on FDG-PET have been described, but changes of this type can also be adjacent to a neoplasm.  Patient prescribed a daily dose of Prednisone 10 mg   07/24/2020  repeat  chest CT on  revealed Lung-RADS 2, benign appearance or behavior. There was emphysema, aortic atherosclerosis, and coronary artery calcifications.  diarrhea.  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  She received azithromycin x 3 days.  She received ciprofloxacin x 10 days without improvement.  She has seen ID in [place].  She began Bactrim on 03/04/2018 (discontinued on 03/11/2018) secondary to increase in creatine.   Chronic kidney disease Urinalysis on 11/15/2017 revealed revealed no hemoglobin, bilirubin or active sediment.   Liver function tests increased on 02/15/2019.  Work-up on 02/16/2019 revealed hepatitis B E antibody positive.  Negative studies included: hepatitis A total antibody, hepatitis A IgM, hepatitis B surface antigen, hepatitis B E antigen.  Hepatitis B DNA was not detected.  Folate was 80.5 and vitamin B12 was 443.  She has received her vaccinations:  She has completed the PCV13, PPSV23, and HiB.  She received Menvio (quadrivalent meningoccal vaccine) and Bexero (univalent serogroup B meningoccal vaccine) on 07/06/2019.  She was diagnosed with C difficile + diarrhea on 08/12/2020.  She completed a course of oral vancomycin.  Stool was negative for C. difficile on 08/27/2020.  11/17/2020 - 11/20/2020 [facility] admission with left L5 varicella zoster.  Lesion was positive for VZV. She was treated with ceftazidime and a valacyclovir.  She was discharged on 9 days of Valtrex 1 gm 3 times daily followed by 500 mg daily indefinitely for suppression and doxycycline.  History of discoid lupus  #Positive hepatitis B core antibody Previously on entecavir 0.5 mg daily, currently she has finished a course. Dose adjusted based on renal function. CrCl 30-49 ml/min   50% dosing (0.5 mg QOD) CrCl >= 50 ml/min 100% dosing (0.5 mg a day). With her kidney function currently QOD She has  completed course of Entecavir and stopped 08/14/2021  # PCP prophylaxis Patient was on Septra DS on Mondays, Wednesdays, and Fridays, which was held due to fluctuated kidney functions.  Currently off Septra.  12/03/2021, CT chest lung cancer screening showed new right middle lobe pulmonary nodule with an aggressive appearance characterized as lung RADS 4 VS.  12/22/2021 PET showed No abnormal FDG avidity associated with the resolving right middle lobe pulmonary nodule now with a 12 mm ground-glass opacity in the area of prior solid nodularity, most consistent with an infectious or inflammatory etiology  INTERVAL HISTORY Tina Page is a 67 y.o. female who has above history reviewed by me today presents for follow up visit  Patient remained on prednisone 2mg  daily.  Patient continues to have  weight gain Chronic fatigue unchanged.  She tolerates reporting injections.  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/23/2017   Post PTCA 07/12/2016   iliac stents bilaterally 2017   Thrombocytopenia (HCC) 10/04/2016   Wears dentures    full upper and lower    Past Surgical History:  Procedure Laterality Date   ELECTROMAGNETIC NAVIGATION BROCHOSCOPY N/A 12/07/2017   Procedure: ELECTROMAGNETIC NAVIGATION BRONCHOSCOPY;  Surgeon: Erin Fulling, MD;  Location: [facility] ORS;  Service: Cardiopulmonary;  Laterality: N/A;   ESOPHAGOGASTRODUODENOSCOPY (EGD) WITH PROPOFOL N/A 07/08/2016   Procedure:  ESOPHAGOGASTRODUODENOSCOPY (EGD) WITH PROPOFOL;  Surgeon: Scot Jun, MD;  Location: [facility] ENDOSCOPY;  Service: Endoscopy;  Laterality: N/A;   KNEE SURGERY Right    repair of acl tear   PERIPHERAL VASCULAR CATHETERIZATION N/A 07/10/2016   Procedure: Lower Extremity Angiography;  Surgeon: Renford Dills, MD;  Location: [facility] INVASIVE CV LAB;  Service: Cardiovascular;  Laterality: N/A;   PERIPHERAL VASCULAR CATHETERIZATION N/A 07/10/2016   Procedure: Abdominal Aortogram w/Lower Extremity;  Surgeon: Renford Dills, MD;  Location: [facility] INVASIVE CV LAB;  Service: Cardiovascular;  Laterality: N/A;   PERIPHERAL VASCULAR CATHETERIZATION  07/10/2016   Procedure: Lower Extremity Intervention;  Surgeon: Renford Dills, MD;  Location: [facility] INVASIVE CV LAB;  Service: Cardiovascular;;    Family History  Problem Relation Age of Onset   Diabetes Mother    Hypertension Mother    Diabetes Maternal Grandfather    Hypertension Maternal Grandfather    Breast cancer Neg Hx     Social History:  reports that she quit smoking about 7 years ago. Her smoking use included cigarettes. She started smoking about 54 years ago. She has a 58.8 pack-year smoking history. She has never used smokeless tobacco. She reports that she does not drink alcohol and does not use drugs.  Former smoker, 58-pack-year smoking history.   She has a daughter, Yehuda Mao and a daughter named [name].  She lives in [place]. The patient is alone today.  Allergies:  Allergies  Allergen Reactions   Ciprofloxacin Swelling    Facial swelling following single oral dose.    Codeine Anaphylaxis   Nsaids Other (See Comments)    Patient has Hep C.     Current Medications: Current Outpatient Medications  Medication Sig Dispense Refill   albuterol (VENTOLIN HFA) 108 (90 Base) MCG/ACT inhaler SMARTSIG:2 Puff(s) By Mouth Every 4 Hours PRN     allopurinol (ZYLOPRIM) 100 MG tablet TAKE 1 TABLET BY MOUTH EVERY DAY 30 tablet 5    Budeson-Glycopyrrol-Formoterol (BREZTRI AEROSPHERE) 160-9-4.8 MCG/ACT AERO Inhale 2 puffs into the lungs in the morning and at bedtime. 11.8 g 0   CALCIUM PO Take by mouth daily at 12 noon.     clopidogrel (PLAVIX) 75 MG tablet Take 1 tablet (75 mg total) by mouth daily. 30 tablet 5   cyanocobalamin 500 MCG tablet Take 500 mcg by mouth daily.     Ensifentrine (OHTUVAYRE) 3 MG/2.5ML SUSP Take 3 mg by nebulization 2 (  two) times daily.     entecavir (BARACLUDE) 0.5 MG tablet Take 0.5 mg by mouth every other day.     hydroxychloroquine (PLAQUENIL) 200 MG tablet Take 200 mg by mouth 2 (two) times daily.     levothyroxine (SYNTHROID, LEVOTHROID) 75 MCG tablet Take 75 mcg by mouth daily before breakfast.      lisinopril-hydrochlorothiazide (ZESTORETIC) 20-25 MG tablet Take 1 tablet by mouth daily. 90 tablet 3   pantoprazole (PROTONIX) 40 MG tablet Take 1 tablet by mouth once daily 30 tablet 1   rosuvastatin (CRESTOR) 40 MG tablet Take 1 tablet (40 mg total) by mouth daily. 90 tablet 3   VITAMIN D PO Take by mouth daily in the afternoon.     predniSONE (DELTASONE) 1 MG tablet Take 1 tablet (1 mg total) by mouth daily with breakfast. 21 tablet 0   No current facility-administered medications for this visit.    Review of Systems  Constitutional:  Negative for appetite change, chills, fatigue and fever.  HENT:   Negative for hearing loss and voice change.   Eyes:  Negative for eye problems.  Respiratory:  Negative for chest tightness and cough.   Cardiovascular:  Negative for chest pain.  Gastrointestinal:  Negative for abdominal distention, abdominal pain and blood in stool.  Endocrine: Negative for hot flashes.  Genitourinary:  Negative for difficulty urinating and frequency.   Musculoskeletal:  Negative for arthralgias.  Skin:  Negative for itching and rash.  Neurological:  Negative for extremity weakness.  Hematological:  Negative for adenopathy.  Psychiatric/Behavioral:  Negative for  confusion. The patient is not nervous/anxious.      Performance status (ECOG): 1  Vital Signs Blood pressure (!) 152/65, pulse 82, temperature (!) 96.2 F (35.7 C), temperature source Tympanic, resp. rate 18, weight 196 lb 8 oz (89.1 kg), SpO2 98%.  Physical Exam Constitutional:      General: She is not in acute distress.    Appearance: She is obese. She is not diaphoretic.  HENT:     Head: Normocephalic and atraumatic.     Nose: Nose normal.     Mouth/Throat:     Pharynx: No oropharyngeal exudate.  Eyes:     General: No scleral icterus.    Pupils: Pupils are equal, round, and reactive to light.  Cardiovascular:     Rate and Rhythm: Normal rate and regular rhythm.     Heart sounds: No murmur heard. Pulmonary:     Effort: Pulmonary effort is normal. No respiratory distress.  Abdominal:     General: There is no distension.     Palpations: Abdomen is soft.     Tenderness: There is no abdominal tenderness.  Musculoskeletal:        General: Normal range of motion.     Cervical back: Normal range of motion and neck supple.  Skin:    General: Skin is warm and dry.     Findings: No erythema.  Neurological:     Mental Status: She is alert and oriented to person, place, and time. Mental status is at baseline.     Cranial Nerves: No cranial nerve deficit.     Motor: No abnormal muscle tone.  Psychiatric:        Mood and Affect: Mood and affect normal.      Laboratory findings     Latest Ref Rng & Units 08/12/2023   10:50 AM 07/08/2023   11:05 AM 06/14/2023   10:42 AM  CBC  WBC 4.0 - 10.5  K/uL 4.8  5.3  4.7   Hemoglobin 12.0 - 15.0 g/dL 29.5  9.7  9.3   Hematocrit 36.0 - 46.0 % 33.7  30.7  29.9   Platelets 150 - 400 K/uL 164  151  180       Latest Ref Rng & Units 08/12/2023   10:50 AM 07/08/2023   11:05 AM 05/10/2023    9:04 AM  CMP  Glucose 70 - 99 mg/dL 97  621  90   BUN 8 - 23 mg/dL 20  27  32   Creatinine 0.44 - 1.00 mg/dL 3.08  6.57  8.46   Sodium 135 - 145  mmol/L 142  140  139   Potassium 3.5 - 5.1 mmol/L 3.8  4.2  3.5   Chloride 98 - 111 mmol/L 106  105  106   CO2 22 - 32 mmol/L 28  28  28    Calcium 8.9 - 10.3 mg/dL 8.9  9.4  9.1   Total Protein 6.5 - 8.1 g/dL 6.6  6.7  6.7   Total Bilirubin <1.2 mg/dL 1.0  0.7  0.7   Alkaline Phos 38 - 126 U/L 49  49  46   AST 15 - 41 U/L 20  16  16    ALT 0 - 44 U/L 12  10  12

## 2023-08-12 NOTE — Assessment & Plan Note (Addendum)
Lab Results  Component Value Date   HGB 10.4 (L) 08/12/2023   TIBC 314 05/14/2022   IRONPCTSAT 26 05/14/2022   FERRITIN 329 (H) 05/14/2022    Recommend Epogen 20,000 units monthly if Hb <11

## 2023-08-12 NOTE — Assessment & Plan Note (Signed)
Encourage oral hydration. Avoid nephrotoxins. 

## 2023-08-12 NOTE — Assessment & Plan Note (Signed)
History of hepatitis B previously on entecavir after her previous rituximab treatments.  Recommend patient to continue Entecavir 0.5mg  Per ID recommendation  for GFR >=30, take every other day For GFR <30, take every 72 hours.

## 2023-08-12 NOTE — Assessment & Plan Note (Addendum)
LDH is normal, adequate B12 and folate level, normal iron panel.  There is increased reticulocyte %-5%.  Cold agglutinin titer negative. Bilirubin is normal. 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  12/22/21 Negative PET scan  05/25/22 Bone marrow biopsy did not reveal any lymphoproliferative diseases. Mild hypocellular marrow, erythroid hyperplasia, mild evidence of spherocytosis, consistent with hemolysis.  Labs are reviewed and discussed with patient. Continue prednisone, decrease to 1 mg daily for 3 weeks and then stop S/p Rituximab weekly x 4  Hemoglobin is stable.

## 2023-08-16 ENCOUNTER — Other Ambulatory Visit: Payer: Self-pay | Admitting: Pulmonary Disease

## 2023-08-17 ENCOUNTER — Encounter: Payer: Self-pay | Admitting: Cardiology

## 2023-08-17 ENCOUNTER — Ambulatory Visit: Payer: Medicare HMO | Attending: Cardiology | Admitting: Cardiology

## 2023-08-17 ENCOUNTER — Other Ambulatory Visit: Payer: Self-pay

## 2023-08-17 VITALS — BP 128/62 | HR 82 | Ht 63.0 in | Wt 194.4 lb

## 2023-08-17 DIAGNOSIS — E782 Mixed hyperlipidemia: Secondary | ICD-10-CM

## 2023-08-17 DIAGNOSIS — I739 Peripheral vascular disease, unspecified: Secondary | ICD-10-CM

## 2023-08-17 DIAGNOSIS — I10 Essential (primary) hypertension: Secondary | ICD-10-CM

## 2023-08-17 DIAGNOSIS — I700 Atherosclerosis of aorta: Secondary | ICD-10-CM

## 2023-08-17 DIAGNOSIS — J449 Chronic obstructive pulmonary disease, unspecified: Secondary | ICD-10-CM

## 2023-08-17 DIAGNOSIS — I2510 Atherosclerotic heart disease of native coronary artery without angina pectoris: Secondary | ICD-10-CM | POA: Diagnosis not present

## 2023-08-17 DIAGNOSIS — I6523 Occlusion and stenosis of bilateral carotid arteries: Secondary | ICD-10-CM

## 2023-08-17 NOTE — Progress Notes (Signed)
Cardiology Office Note:  .   Date:  08/17/2023  ID:  SKII KLAWITER, DOB 09-Sep-1955, MRN [id number] PCP: Abram Sander, MD  [facility] Providers Cardiologist:  None    History of Present Illness: .   Tina Page is a 67 y.o. female with a past medical history of COPD, hyperlipidemia, PAD status post PTCA, hypertension, hemolytic anemia, aortic atherosclerosis, status post bilateral iliac artery stents using kissing balloon technique (07/2016), coronary calcification on CT scan, CKD stage III, who is here today for follow-up.   CAD, screening November 2021 revealed moderate aortic atherosclerosis particularly in the arch per Dr. Windell Hummingbird review and at least moderate to coronary calcification of the distal left main or bifurcation, proximal LAD, and RCA.  Ultrasound of bilateral carotids showed right ICA with 1-39% stenosis and left ICA with 40-59% stenosis and continues to be followed by vascular.  Hemolytic anemia.  Continues to be followed by oncology/hematology.  She also sees pulmonary for lung nodules/right middle lobe mass has with bronchoscopy with the diagnosis of organizing pneumonia and no malignancy.   She was last seen in clinic 08/25/2022 and had been doing well from a cardiac perspective she continued to have shortness of breath related to her COPD that improved improved since pulmonary started on Trelegy.  She denied any angina or signs of decompensation.  She was continued on her current medication regimen and no further testing was ordered at that time.  She returns to clinic today stating that overall she has continued to do well from the cardiac perspective.  She continues to suffer from chronic shortness of breath that is unchanged.  She states she was recently placed on nebulizer treatments by pulmonary and thought that there was probably some improvement but there is definitely no worsening.  She states she recently had an ultrasound done of her right lower  extremity because she had concerns of the DVT as he has symptoms colorization to her right heel and is concerned about potentially losing in extremity has not has happened to her mother in the past.  She recently had ABIs completed with vascular that were unchanged and has upcoming ABIs scheduled to reevaluate arterial blood flow.  She states that she has been compliant with her current medications and denies any hospitalizations or visits to the emergency department.  ROS: 10 point review of system has been reviewed and considered negative with exception what is been listed in HPI  Studies Reviewed: [name] Kitchen   EKG Interpretation Date/Time:  Tuesday August 17 2023 09:06:27 EST Ventricular Rate:  82 PR Interval:  166 QRS Duration:  90 QT Interval:  374 QTC Calculation: 436 R Axis:   0  Text Interpretation: Normal sinus rhythm Low voltage QRS Possible Inferior infarct , age undetermined When compared with ECG of 06-Dec-2017 13:26, Confirmed by Charlsie Quest ([id number]) on 08/17/2023 9:16:35 AM    2D echo 10/23/22 1. Left ventricular ejection fraction, by estimation, is 60 to 65%. The  left ventricle has normal function. The left ventricle has no regional  wall motion abnormalities. There is mild left ventricular hypertrophy.  Left ventricular diastolic parameters  are consistent with Grade I diastolic dysfunction (impaired relaxation).  The average left ventricular global longitudinal strain is -16.8 %.   2. Right ventricular systolic function is normal. The right ventricular  size is normal. There is normal pulmonary artery systolic pressure. The  estimated right ventricular systolic pressure is 15.1 mmHg.   3. The mitral valve is normal in  structure. No evidence of mitral valve  regurgitation. No evidence of mitral stenosis.   4. The aortic valve has an indeterminant number of cusps. Aortic valve  regurgitation is not visualized. No aortic stenosis is present.   5. The inferior vena cava is  normal in size with greater than 50%  respiratory variability, suggesting right atrial pressure of 3 mmHg.   Lexiscan Myoview 12/23/2017 Pharmacological myocardial perfusion imaging study with no significant  Ischemia GI uptake artifact noted Normal wall motion, EF estimated at 77% No EKG changes concerning for ischemia at peak stress or in recovery. Low risk scan Risk Assessment/Calculations:             Physical Exam:   VS:  BP 128/62   Pulse 82   Ht 5\' 3"  (1.6 m)   Wt 194 lb 6.4 oz (88.2 kg)   SpO2 94%   BMI 34.44 kg/m    Wt Readings from Last 3 Encounters:  08/17/23 194 lb 6.4 oz (88.2 kg)  08/12/23 196 lb 8 oz (89.1 kg)  07/23/23 195 lb 9.6 oz (88.7 kg)    GEN: Well nourished, well developed in no acute distress NECK: No JVD; No carotid bruits CARDIAC: RRR, no murmurs, rubs, gallops RESPIRATORY:  Clear to auscultation without rales, wheezing or rhonchi  ABDOMEN: Soft, non-tender, non-distended EXTREMITIES:  No edema; No deformity, purple discoloration tot he inner aspect of the right heel, 2+ DP pulse to the right and left   ASSESSMENT AND PLAN: .   Aortic atherosclerosis noted on previous CT scans.  Monitor for exacerbation in the heart place moderate coronary calcification distal left main/bifurcation, proximal LAD and RCA.  She is continued on clopidogrel 75 mg daily and rosuvastatin 40 mg daily.  EKG today reveals sinus rhythm with a rate of 82 with a questionable inferior infarct that is old.  She denies any anginal or anginal equivalents today.  No further ischemic testing at this time.  Primary hypertension with blood pressure 128/62.  Blood pressures are well-controlled.  She is continued on lisinopril HCTZ 20/25 mg daily.  She is also been encouraged to continue to monitor her blood pressure 1 to 2 hours postmedication administration at home as well.  Mixed hyperlipidemia with an LDL 55.  She is continued on rosuvastatin 40 mg daily.  Peripheral arterial disease  with known carotid disease with previous stenting done to the bilateral lower extremities by [facility].  Recent carotid duplex revealed right ICA 40-59% stenosis of the left ICA 40 to 59% stenosis with the right slightly increased but left remaining stable.  She is to continue with follow-up yearly with a repeat ultrasound.  Is continued on clopidogrel 75 mg daily and statin therapy.  She also has upcoming ABI's with [facility] for reevaluation of her bilateral lower extremities.  Chronic shortness of breath with COPD, she continues to be followed by pulmonary.  Stated she has started on Pepcid that have made a slight improvement.   Cardiac Rehabilitation Eligibility Assessment         Dispo: Patient to return to clinic to see MD/APP in 10 to 12 months or sooner if needed  Signed, Saroya Riccobono, NP

## 2023-08-17 NOTE — Telephone Encounter (Signed)
We can send 1 month supply, she should discuss with her primary or GI whether she needs to continue this.  She was having significant issues with reflux previously.

## 2023-08-17 NOTE — Patient Instructions (Signed)
Medication Instructions:  - No changes *If you need a refill on your cardiac medications before your next appointment, please call your pharmacy*  Lab Work: - None ordered  Testing/Procedures: - None ordered  Follow-Up: At [facility], you and your health needs are our priority.  As part of our continuing mission to provide you with exceptional heart care, we have created designated Provider Care Teams.  These Care Teams include your primary Cardiologist (physician) and Advanced Practice Providers (APPs -  Physician Assistants and Nurse Practitioners) who all work together to provide you with the care you need, when you need it.  Your next appointment:   10 - 12 month(s)  Provider:   Charlsie Quest, NP

## 2023-08-18 ENCOUNTER — Other Ambulatory Visit: Payer: Self-pay | Admitting: Pulmonary Disease

## 2023-08-24 ENCOUNTER — Encounter: Payer: Self-pay | Admitting: Oncology

## 2023-09-02 ENCOUNTER — Ambulatory Visit: Payer: Medicare HMO | Admitting: Nurse Practitioner

## 2023-09-02 ENCOUNTER — Telehealth: Payer: Self-pay | Admitting: Nurse Practitioner

## 2023-09-02 ENCOUNTER — Encounter: Payer: Self-pay | Admitting: Oncology

## 2023-09-02 ENCOUNTER — Other Ambulatory Visit (HOSPITAL_COMMUNITY): Payer: Self-pay

## 2023-09-02 ENCOUNTER — Encounter: Payer: Self-pay | Admitting: Nurse Practitioner

## 2023-09-02 ENCOUNTER — Telehealth: Payer: Self-pay

## 2023-09-02 VITALS — BP 126/60 | HR 87 | Temp 97.9°F | Ht 63.0 in | Wt 195.4 lb

## 2023-09-02 DIAGNOSIS — R918 Other nonspecific abnormal finding of lung field: Secondary | ICD-10-CM | POA: Diagnosis not present

## 2023-09-02 DIAGNOSIS — J449 Chronic obstructive pulmonary disease, unspecified: Secondary | ICD-10-CM | POA: Diagnosis not present

## 2023-09-02 MED ORDER — BREZTRI AEROSPHERE 160-9-4.8 MCG/ACT IN AERO: 2.0000 | INHALATION_SPRAY | Freq: Two times a day (BID) | RESPIRATORY_TRACT | Status: DC

## 2023-09-02 NOTE — Progress Notes (Signed)
Agree with the details of the visit as noted by Katherine Cobb, NP. ° °C. Laura Yasin Ducat, MD °Advanced Bronchoscopy °PCC[facility] ° °

## 2023-09-02 NOTE — Telephone Encounter (Signed)
 Patient was given patient assistant form to fill out for [employer].

## 2023-09-02 NOTE — Patient Instructions (Addendum)
 Continue Albuterol  inhaler 2 puffs or 3 mL neb every 6 hours as needed for shortness of breath or wheezing. Notify if symptoms persist despite rescue inhaler/neb use.  Continue Breztri  2 puffs twice daily.  Brush tongue and rinse mouth afterwards. I will find out about a more affordable regimen. In the meantime, use the samples we gave you and complete/return the patient assistance paperwork  Continue Ohtuvayre  2.5 mL neb Twice daily    Lung cancer screening CT next October 2025   Follow up in 4 months with Dr. Tamea or Katie Suzzette Gasparro,NP. If symptoms do not improve or worsen, please contact office for sooner follow up or seek emergency care.

## 2023-09-02 NOTE — Telephone Encounter (Signed)
 Per test claim patient has a $250.00 deductible included in these prices  LAMA/LABA Anoro Ellipta- $307.74 Bevespi - $296.82  ICS/LABA Generic Advair Diskus/Wixela- $12.00 GeneriSymbicort - $160.14 Dulera- $272.57 Breo Ellipta- $287.60  LAMA Incruse Ellipta- $276.66

## 2023-09-02 NOTE — Progress Notes (Signed)
 @Patient  ID: Tina Page, female    DOB: [birth year], 68 y.o.   MRN: [id number]  Chief Complaint  Patient presents with   Follow-up    Cough, shortness of breath on exertion, and wheezing.     Referring provider: Adina Buel HERO, MD  HPI: 68 year old female, former smoker followed for severe COPD. She is a patient of Dr. Tamea and last seen in office 07/23/2023 by [facility] NP. Past medical history significant for atherosclerosis, PAD, HTN, chronic hepatitis B, hypothyroid, CKD, anemia, HLD, mixed CTD.  TEST/EVENTS:  11/18/2017 PFT: FVC 63, FEV1 51, ratio 65.  Moderate obstruction without reversibility.  Normal lung volumes.  Moderate to severe diffusing defect 02/16/2018 CT chest: Complete resolution of spiculated nodules right middle lobe.  Stable 5 mm right perihilar nodule since 2019 but not present in 2017 06/27/2019 CT chest: Previous right middle lobe nodule almost completely resolved, residual scarring.  Persistent consolidation in medial right middle lobe and lingula, in keeping with atypical infection.  Interval increase in size of small nodule and right middle lobe, 4 mm previously 2 mm.  Unchanged tiny biapical pulmonary nodules.  Consider sequela of nonspecific infection or inflammation. 12/02/2021 LDCT chest: New right middle lobe pulmonary nodule with aggressive appearance categorized as lung RADS 4 BS, suspicious.  Mild diffuse bronchial wall thickening and mild centrilobular and paraseptal emphysema. 12/22/2021 PET: Right middle lobe pulmonary nodule appears to be resolving, no FDG avid, consistent with infectious/inflammatory etiology. 03/05/2022 LDCT chest: Lung RADS 3, probably benign.  New 5.9 mm right lower lobe nodule.  Short-term follow-up in 6 months. 07/07/2022 PFT: FVC 43, FEV1 36, ratio 64.  Severe obstructive airway disease with reversibility.  Air trapping.  Moderate to severe diffusing capacity impairment. 09/07/2022 LDCT chest: Lung RADS 4A, suspicious.  Minimally  progressive irregular nodule in right lower lobe measuring 6.1 mm.  Suggest follow-up in 3 months. 03/09/2023 LDCT chest: Lung RADS 4A, suspicious.  7.2 mm.  Mildly progressive however still below PET sensitivity.  Additional repeat dose CT in 3 to 6 months. 05/25/2023 PFT: FVC 42, FEV1 35, ratio 65.  Severe obstructive airway disease with reversibility.  Air trapping.  Severe diffusion defect  05/26/2023: OV with Dr. Tamea.  Presents for scheduled follow-up.  Continues to have significant dyspnea on exertion, wheezing and chest congestion that is difficult to expectorate.  No cough.  No hemoptysis.  Average use of rescue inhaler is twice a day.  Had a CT chest for follow-up March 09, 2023 which shows that the nodule of interest in the right lower lobe is mildly progressive at 7.2 mm, previously noted to be 6.1 mm.  Still below PET threshold.  She has follow-up CT scheduled June 01, 2023.  PFTs showed FEV1 of 35%, FVC of 42% and ratio of 65%.  Significant bronchodilator response.  Diffusion capacity was severely impaired.  Testing primarily unchanged from November 2023 but has had a decline since 2021.  Walk test without desaturations on room air.  SpO2 low was 90%.  Advised to continue Breztri .  Add Ohtuvayre  nebs twice daily.  Attend follow-up CT scan.  07/23/2023: OV with Timber Marshman NP. Ms Riles, a patient with a history of severe COPD, was started on Ohtuvayre  during the last visit. The patient reports that the medication has been beneficial, reducing the severity of shortness of breath. The patient still experiences occasional shortness of breath, particularly during activities such as distance walking or uphill climbing. However, activities around the house are generally well-tolerated.  The patient also reports wheezing upon heavy exertion. The patient uses albuterol , a rescue inhaler, primarily in the morning or when experiencing breathlessness during outdoor activities. On average 1-2 times a day, which is  normal for her. The patient also continues to use Breztri . No significant cough, chest congestion, fevers, chills, night sweats, orthopnea, weight loss, anorexia.    The patient has been on prednisone  for over a year, currently at a dose of 2 mg per day, due to a diagnosis of hemolytic anemia. The patient has been gradually tapered down from a higher dose. She's scheduled to see hematology in December.    The patient has a history of lung nodules, which were found to be stable on a recent CT scan. She will have a repeat scan in October 2025 with lung cancer screening program.   Recently, the patient has noticed increased swelling in her right leg, more than usual. 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 She's currently on plavix  but no anticoagulation therapy. She has been experiencing tenderness in the area. The patient also reports a red area to the back of her calf/heel below the pain/swelling. She did reach out to her vascular doctor but has not heard back. She did recently travel to [place]  with her church group last week. They drove there, which was a 7 hour car ride there and 7 hours back. Symptoms started after she got home. No increased dyspnea, hemoptysis, hypoxia, tachycardia  09/02/2023: Today - follow up Patient presents today for follow up. After her last visit, she had US  of BLE, which was negative for DVT. Swelling in her leg has improved. She is feeling back to normal. Breathing is at her baseline. Has a chronic cough. Main concern is cost of Breztri . Since her insurance changed, it's going to cost her almost $200 a month. She wants to look at alternative options. She denies any chest congestion, increased wheezing, increased sputum productive. She's still using the Ohtuvayre  twice a day, which does seem to help.   Allergies  Allergen Reactions   Ciprofloxacin  Swelling    Facial swelling following single oral dose.    Codeine Anaphylaxis   Nsaids Other (See  Comments)    Patient has Hep C.     Immunization History  Administered Date(s) Administered   Fluad Trivalent(High Dose 65+) 05/13/2023   HIB (PRP-T) 04/14/2018   Influenza Split 05/29/2011, 05/25/2013   Influenza,inj,Quad PF,6+ Mos 05/17/2018, 07/02/2020, 05/20/2022   Influenza,inj,quad, With Preservative 05/17/2014   Influenza-Unspecified 05/17/2018, 05/16/2019   Meningococcal B, OMV 07/06/2019   Meningococcal Mcv4o 07/06/2019, 07/02/2020   Moderna Sars-Covid-2 Vaccination 11/14/2019, 12/08/2019   Pfizer(Comirnaty)Fall Seasonal Vaccine 12 years and older 07/07/2023   Pneumococcal Conjugate-13 04/14/2018   Pneumococcal Polysaccharide-23 07/22/2016   Tdap 05/29/2011, 10/18/2014, 11/18/2020    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/23/2017   Post PTCA 07/12/2016   iliac stents bilaterally 2017   Thrombocytopenia (HCC) 10/04/2016   Wears dentures  full upper and lower    Tobacco History: Social History   Tobacco Use  Smoking Status Former   Current packs/day: 0.00   Average packs/day: 1.3 packs/day for 47.0 years (58.8 ttl pk-yrs)   Types: Cigarettes   Start date: 07/11/1969   Quit date: 07/11/2016   Years since quitting: 7.1  Smokeless Tobacco Never   Counseling given: Not Answered   Outpatient Medications Prior to Visit  Medication Sig Dispense Refill   albuterol  (VENTOLIN  HFA) 108 (90 Base) MCG/ACT inhaler SMARTSIG:2 Puff(s) By Mouth Every 4 Hours PRN     allopurinol   (ZYLOPRIM ) 100 MG tablet TAKE 1 TABLET BY MOUTH EVERY DAY 30 tablet 5   Budeson-Glycopyrrol-Formoterol  (BREZTRI  AEROSPHERE) 160-9-4.8 MCG/ACT AERO Inhale 2 puffs into the lungs in the morning and at bedtime. 11.8 g 0   CALCIUM  PO Take by mouth daily at 12 noon.     clopidogrel  (PLAVIX ) 75 MG tablet Take 1 tablet (75 mg total) by mouth daily. 30 tablet 5   cyanocobalamin  500 MCG tablet Take 500 mcg by mouth daily.     Ensifentrine  (OHTUVAYRE ) 3 MG/2.5ML SUSP Take 3 mg by nebulization 2 (two) times daily.     entecavir  (BARACLUDE ) 0.5 MG tablet Take 0.5 mg by mouth every other day.     hydroxychloroquine  (PLAQUENIL ) 200 MG tablet Take 200 mg by mouth 2 (two) times daily.     levothyroxine  (SYNTHROID , LEVOTHROID) 75 MCG tablet Take 75 mcg by mouth daily before breakfast.      lisinopril -hydrochlorothiazide  (ZESTORETIC ) 20-25 MG tablet Take 1 tablet by mouth daily. 90 tablet 3   pantoprazole  (PROTONIX ) 40 MG tablet Take 1 tablet by mouth once daily 30 tablet 0   rosuvastatin  (CRESTOR ) 40 MG tablet Take 1 tablet (40 mg total) by mouth daily. 90 tablet 3   VITAMIN D  PO Take by mouth daily in the afternoon.     predniSONE  (DELTASONE ) 1 MG tablet Take 1 tablet (1 mg total) by mouth daily with breakfast. (Patient not taking: Reported on 09/02/2023) 21 tablet 0   No facility-administered medications prior to visit.     Review of Systems:   Constitutional: No weight loss or gain, night sweats, fevers, chills, fatigue, or lassitude. HEENT: No headaches, difficulty swallowing, tooth/dental problems, or sore throat. No sneezing, itching, ear ache, nasal congestion, or post nasal drip CV:  No chest pain, orthopnea, swelling in lower extremities, PND, anasarca, dizziness, palpitations, syncope Resp: +baseline shortness of breath with exertion; occasional wheeze; dry cough. No excess mucus or change in color of mucus. No hemoptysis. No chest wall deformity GI:  No heartburn, indigestion, abdominal pain,  nausea, vomiting, diarrhea, change in bowel habits, loss of appetite, bloody stools.  GU: No dysuria, change in color of urine, urgency or frequency.  No flank pain, no hematuria  Skin: No rash, lesions, ulcerations MSK:  No joint pain or swelling.  No decreased range of motion.  No back pain. Neuro: No dizziness or lightheadedness.  Psych: No depression or anxiety. Mood stable.     Physical Exam:  BP 126/60 (BP Location: Left Arm, Patient Position: Sitting, Cuff Size: Normal)   Pulse 87   Temp 97.9 F (36.6 C) (Temporal)   Ht 5' 3 (1.6 m)   Wt 195 lb 6.4 oz (88.6 kg)   SpO2 98%   BMI 34.61 kg/m   GEN: Pleasant, interactive, well-kempt; obese; in no acute distress HEENT:  Normocephalic and atraumatic. PERRLA. Sclera white. Nasal turbinates pink, moist and patent bilaterally. No rhinorrhea  present. Oropharynx pink and moist, without exudate or edema. No lesions, ulcerations, or postnasal drip.  NECK:  Supple w/ fair ROM. No JVD present. Normal carotid impulses w/o bruits. Thyroid symmetrical with no goiter or nodules palpated. No lymphadenopathy.   CV: RRR, no m/r/g, no peripheral edema. Pulses intact, +2 bilaterally. No cyanosis, pallor or clubbing.   PULMONARY:  Unlabored, regular breathing. Diminished bilaterally A&P w/o wheezes/rales/rhonchi. No accessory muscle use.  GI: BS present and normoactive. Soft, non-tender to palpation. No organomegaly or masses detected.  MSK: No erythema, warmth or tenderness to joints. Cap refil <2 sec all extrem.  Neuro: A/Ox3. No focal deficits noted.   Skin: Warm, no lesions or rashe Psych: Normal affect and behavior. Judgement and thought content appropriate.     Lab Results:  CBC    Component Value Date/Time   WBC 4.8 08/12/2023 1050   WBC 7.3 01/09/2023 1932   RBC 3.16 (L) 08/12/2023 1050   HGB 10.4 (L) 08/12/2023 1050   HGB 9.0 (L) 02/18/2018 1204   HCT 33.7 (L) 08/12/2023 1050   HCT 46.5 06/11/2014 1732   PLT 164 08/12/2023 1050    PLT 127 (L) 06/11/2014 1732   MCV 106.6 (H) 08/12/2023 1050   MCV 92 06/11/2014 1732   MCH 32.9 08/12/2023 1050   MCHC 30.9 08/12/2023 1050   RDW 15.2 08/12/2023 1050   RDW 14.5 06/11/2014 1732   LYMPHSABS 0.9 08/12/2023 1050   MONOABS 0.4 08/12/2023 1050   EOSABS 0.1 08/12/2023 1050   BASOSABS 0.1 08/12/2023 1050    BMET    Component Value Date/Time   NA 142 08/12/2023 1050   NA 142 06/11/2014 1732   K 3.8 08/12/2023 1050   K 3.1 (L) 06/11/2014 1732   CL 106 08/12/2023 1050   CL 103 06/11/2014 1732   CO2 28 08/12/2023 1050   CO2 32 06/11/2014 1732   GLUCOSE 97 08/12/2023 1050   GLUCOSE 100 (H) 06/11/2014 1732   BUN 20 08/12/2023 1050   BUN 15 06/11/2014 1732   CREATININE 1.31 (H) 08/12/2023 1050   CREATININE 0.73 06/11/2014 1732   CALCIUM  8.9 08/12/2023 1050   CALCIUM  8.9 06/11/2014 1732   GFRNONAA 45 (L) 08/12/2023 1050   GFRNONAA >60 06/11/2014 1732   GFRAA 58 (L) 12/28/2019 1325   GFRAA >60 06/11/2014 1732    BNP No results found for: BNP   Imaging:  No results found.  epoetin  alfa (EPOGEN ) injection 20,000 Units     Date Action Dose Route User   07/08/2023 1145 Given 20,000 Units Intravenous (Right Arm) Love, Victoria, CMA      epoetin  alfa (EPOGEN ) injection 20,000 Units     Date Action Dose Route User   08/12/2023 1152 Given 20,000 Units Intravenous (Right Arm) Bari Fonda MATSU, [place]          Latest Ref Rng & Units 05/25/2023    9:17 AM 07/07/2022   10:42 AM  PFT Results  FVC-Pre L 1.29  1.34   FVC-Predicted Pre % 42  43   FVC-Post L 1.49  1.59   FVC-Predicted Post % 48  51   Pre FEV1/FVC % % 65  64   Post FEV1/FCV % % 67  64   FEV1-Pre L 0.84  0.86   FEV1-Predicted Pre % 35  36   FEV1-Post L 0.99  1.02   DLCO uncorrected ml/min/mmHg 6.86  9.31   DLCO UNC% % 35  48   DLVA Predicted % 47  56  TLC L 4.81  4.50   TLC % Predicted % 98  91   RV % Predicted % 160  136     No results found for: NITRICOXIDE      Assessment &  Plan:     Chronic Obstructive Pulmonary Disease (COPD) Severe COPD. Reports improvement in dyspnea with new inhaled nebulized medication. Tolerating Ohtuvayre  well without side effects. High symptom burden but appears compensated on current regimen. Breztri  is costly with new insurance. Will provide her with samples and patient assistance application. Message sent to pharmacy team to run test claims for more affordable alternatives. Encouraged to remain active. Action plan in place.  - Continue Ohtuvayre  nebulized medication - Continue albuterol  rescue inhaler as needed - Continue Breztri  2 puffs bid. Oral hygiene reviewed - Repeat CT scan in October 2025  Pulmonary nodules/Former smoker Stable on recent imaging. Lung RADS 2. Repeat CT chest with lung cancer screening program in October 2025. - LDCT chest 05/2024   Advised if symptoms do not improve or worsen, to please contact office for sooner follow up or seek emergency care.   I spent 32 minutes of dedicated to the care of this patient on the date of this encounter to include pre-visit review of records, face-to-face time with the patient discussing conditions above, post visit ordering of testing, clinical documentation with the electronic health record, making appropriate referrals as documented, and communicating necessary findings to members of the patients care team.  Comer LULLA Rouleau, NP 09/02/2023  Pt aware and understands NP's role.

## 2023-09-03 ENCOUNTER — Other Ambulatory Visit: Payer: Self-pay

## 2023-09-07 NOTE — Telephone Encounter (Signed)
 Larita Fife from [employer] needs prior authorization. [employer] does not need authorization. Larita Fife phone number is [phone].

## 2023-09-09 ENCOUNTER — Inpatient Hospital Stay: Payer: Medicare HMO

## 2023-09-09 ENCOUNTER — Inpatient Hospital Stay: Payer: Medicare HMO | Attending: Oncology

## 2023-09-09 VITALS — BP 108/60

## 2023-09-09 DIAGNOSIS — Z87891 Personal history of nicotine dependence: Secondary | ICD-10-CM | POA: Insufficient documentation

## 2023-09-09 DIAGNOSIS — D631 Anemia in chronic kidney disease: Secondary | ICD-10-CM | POA: Insufficient documentation

## 2023-09-09 DIAGNOSIS — D591 Autoimmune hemolytic anemia, unspecified: Secondary | ICD-10-CM

## 2023-09-09 DIAGNOSIS — D5911 Warm autoimmune hemolytic anemia: Secondary | ICD-10-CM | POA: Insufficient documentation

## 2023-09-09 DIAGNOSIS — N1831 Chronic kidney disease, stage 3a: Secondary | ICD-10-CM | POA: Insufficient documentation

## 2023-09-09 DIAGNOSIS — Z7952 Long term (current) use of systemic steroids: Secondary | ICD-10-CM | POA: Insufficient documentation

## 2023-09-09 LAB — CBC WITH DIFFERENTIAL (CANCER CENTER ONLY)
Abs Immature Granulocytes: 0.04 10*3/uL (ref 0.00–0.07)
Basophils Absolute: 0.1 10*3/uL (ref 0.0–0.1)
Basophils Relative: 1 %
Eosinophils Absolute: 0.2 10*3/uL (ref 0.0–0.5)
Eosinophils Relative: 3 %
HCT: 31 % — ABNORMAL LOW (ref 36.0–46.0)
Hemoglobin: 10 g/dL — ABNORMAL LOW (ref 12.0–15.0)
Immature Granulocytes: 1 %
Lymphocytes Relative: 25 %
Lymphs Abs: 1.4 10*3/uL (ref 0.7–4.0)
MCH: 33.4 pg (ref 26.0–34.0)
MCHC: 32.3 g/dL (ref 30.0–36.0)
MCV: 103.7 fL — ABNORMAL HIGH (ref 80.0–100.0)
Monocytes Absolute: 0.4 10*3/uL (ref 0.1–1.0)
Monocytes Relative: 8 %
Neutro Abs: 3.4 10*3/uL (ref 1.7–7.7)
Neutrophils Relative %: 62 %
Platelet Count: 196 10*3/uL (ref 150–400)
RBC: 2.99 MIL/uL — ABNORMAL LOW (ref 3.87–5.11)
RDW: 14.6 % (ref 11.5–15.5)
WBC Count: 5.4 10*3/uL (ref 4.0–10.5)
nRBC: 0 % (ref 0.0–0.2)

## 2023-09-09 LAB — BASIC METABOLIC PANEL - CANCER CENTER ONLY
Anion gap: 11 (ref 5–15)
BUN: 35 mg/dL — ABNORMAL HIGH (ref 8–23)
CO2: 25 mmol/L (ref 22–32)
Calcium: 9.7 mg/dL (ref 8.9–10.3)
Chloride: 104 mmol/L (ref 98–111)
Creatinine: 1.76 mg/dL — ABNORMAL HIGH (ref 0.44–1.00)
GFR, Estimated: 32 mL/min — ABNORMAL LOW (ref >60)
Glucose, Bld: 112 mg/dL — ABNORMAL HIGH (ref 70–99)
Potassium: 3.8 mmol/L (ref 3.5–5.1)
Sodium: 140 mmol/L (ref 135–145)

## 2023-09-09 MED ORDER — EPOETIN ALFA 20000 UNIT/ML IJ SOLN
20000.0000 [IU] | Freq: Once | INTRAMUSCULAR | Status: AC
Start: 2023-09-09 — End: 2023-09-09
  Administered 2023-09-09: 20000 [IU] via INTRAVENOUS
  Filled 2023-09-09: qty 1

## 2023-09-10 NOTE — Telephone Encounter (Signed)
 Can you let the patient know she has a $250 deductible? After she hits this, the price of her Markus Daft should go down. If she's willing to wait to see, I would recommend she stay on this. If the cost doesn't go down, we can look at alternatives.

## 2023-09-10 NOTE — Telephone Encounter (Signed)
 Lm x1 for the patient.

## 2023-09-15 ENCOUNTER — Other Ambulatory Visit: Payer: Self-pay | Admitting: Pulmonary Disease

## 2023-09-15 NOTE — Telephone Encounter (Signed)
 Okay to refill 1 time, she needs to be getting these refills from her primary care.

## 2023-09-16 NOTE — Telephone Encounter (Signed)
I notified the patient. She will fill out the patient assistance form for [facility] and return it to our office.  Nothing further needed.

## 2023-09-21 ENCOUNTER — Other Ambulatory Visit: Payer: Self-pay | Admitting: Family Medicine

## 2023-09-21 DIAGNOSIS — Z1231 Encounter for screening mammogram for malignant neoplasm of breast: Secondary | ICD-10-CM

## 2023-09-28 NOTE — Telephone Encounter (Signed)
Form has been signed by Dr. Jayme Cloud and faxed to [employer].  Nothing further needed.

## 2023-09-29 ENCOUNTER — Other Ambulatory Visit: Payer: Self-pay

## 2023-09-29 NOTE — Telephone Encounter (Signed)
Patient approved for patient assistance for Bre[employer]tri from 09/29/2023 to 08/30/2024  Patient ID: PEP_ID: [id number]  Chesley Mires, PharmD, MPH, BCPS, CPP Clinical Pharmacist (Rheumatology and Pulmonology)

## 2023-10-07 ENCOUNTER — Inpatient Hospital Stay: Payer: Medicare HMO

## 2023-10-07 ENCOUNTER — Inpatient Hospital Stay: Payer: Medicare HMO | Attending: Oncology

## 2023-10-07 VITALS — BP 147/61

## 2023-10-07 DIAGNOSIS — D5911 Warm autoimmune hemolytic anemia: Secondary | ICD-10-CM | POA: Diagnosis present

## 2023-10-07 DIAGNOSIS — N1831 Chronic kidney disease, stage 3a: Secondary | ICD-10-CM | POA: Insufficient documentation

## 2023-10-07 DIAGNOSIS — D591 Autoimmune hemolytic anemia, unspecified: Secondary | ICD-10-CM

## 2023-10-07 DIAGNOSIS — D631 Anemia in chronic kidney disease: Secondary | ICD-10-CM | POA: Diagnosis present

## 2023-10-07 DIAGNOSIS — Z87891 Personal history of nicotine dependence: Secondary | ICD-10-CM | POA: Diagnosis not present

## 2023-10-07 DIAGNOSIS — Z7952 Long term (current) use of systemic steroids: Secondary | ICD-10-CM | POA: Diagnosis not present

## 2023-10-07 LAB — CBC WITH DIFFERENTIAL (CANCER CENTER ONLY)
Abs Immature Granulocytes: 0.02 10*3/uL (ref 0.00–0.07)
Basophils Absolute: 0.1 10*3/uL (ref 0.0–0.1)
Basophils Relative: 1 %
Eosinophils Absolute: 0.2 10*3/uL (ref 0.0–0.5)
Eosinophils Relative: 3 %
HCT: 29.6 % — ABNORMAL LOW (ref 36.0–46.0)
Hemoglobin: 9.4 g/dL — ABNORMAL LOW (ref 12.0–15.0)
Immature Granulocytes: 0 %
Lymphocytes Relative: 17 %
Lymphs Abs: 0.8 10*3/uL (ref 0.7–4.0)
MCH: 33.1 pg (ref 26.0–34.0)
MCHC: 31.8 g/dL (ref 30.0–36.0)
MCV: 104.2 fL — ABNORMAL HIGH (ref 80.0–100.0)
Monocytes Absolute: 0.4 10*3/uL (ref 0.1–1.0)
Monocytes Relative: 8 %
Neutro Abs: 3.2 10*3/uL (ref 1.7–7.7)
Neutrophils Relative %: 71 %
Platelet Count: 167 10*3/uL (ref 150–400)
RBC: 2.84 MIL/uL — ABNORMAL LOW (ref 3.87–5.11)
RDW: 14.7 % (ref 11.5–15.5)
WBC Count: 4.5 10*3/uL (ref 4.0–10.5)
nRBC: 0 % (ref 0.0–0.2)

## 2023-10-07 LAB — BASIC METABOLIC PANEL - CANCER CENTER ONLY
Anion gap: 11 (ref 5–15)
BUN: 21 mg/dL (ref 8–23)
CO2: 27 mmol/L (ref 22–32)
Calcium: 9.5 mg/dL (ref 8.9–10.3)
Chloride: 102 mmol/L (ref 98–111)
Creatinine: 1.63 mg/dL — ABNORMAL HIGH (ref 0.44–1.00)
GFR, Estimated: 35 mL/min — ABNORMAL LOW (ref >60)
Glucose, Bld: 95 mg/dL (ref 70–99)
Potassium: 3.9 mmol/L (ref 3.5–5.1)
Sodium: 140 mmol/L (ref 135–145)

## 2023-10-07 MED ORDER — EPOETIN ALFA 20000 UNIT/ML IJ SOLN
20000.0000 [IU] | Freq: Once | INTRAMUSCULAR | Status: AC
  Administered 2023-10-07: 20000 [IU] via INTRAVENOUS
  Filled 2023-10-07: qty 1

## 2023-10-11 ENCOUNTER — Other Ambulatory Visit: Payer: Self-pay | Admitting: Cardiovascular Disease

## 2023-10-14 ENCOUNTER — Other Ambulatory Visit: Payer: Self-pay | Admitting: Pulmonary Disease

## 2023-10-18 NOTE — Telephone Encounter (Signed)
May send in 1 refill but patient needs to get this medication from primary MD.

## 2023-11-10 ENCOUNTER — Inpatient Hospital Stay: Payer: Medicare HMO

## 2023-11-10 ENCOUNTER — Inpatient Hospital Stay: Payer: Medicare HMO | Attending: Oncology

## 2023-11-10 ENCOUNTER — Inpatient Hospital Stay: Payer: Medicare HMO | Admitting: Oncology

## 2023-11-10 ENCOUNTER — Encounter: Payer: Self-pay | Admitting: Oncology

## 2023-11-10 VITALS — BP 116/56 | HR 94 | Temp 97.0°F | Resp 18 | Wt 181.1 lb

## 2023-11-10 DIAGNOSIS — N1832 Chronic kidney disease, stage 3b: Secondary | ICD-10-CM | POA: Diagnosis not present

## 2023-11-10 DIAGNOSIS — D631 Anemia in chronic kidney disease: Secondary | ICD-10-CM | POA: Diagnosis present

## 2023-11-10 DIAGNOSIS — D5911 Warm autoimmune hemolytic anemia: Secondary | ICD-10-CM | POA: Insufficient documentation

## 2023-11-10 DIAGNOSIS — R7689 Other specified abnormal immunological findings in serum: Secondary | ICD-10-CM

## 2023-11-10 DIAGNOSIS — Z7952 Long term (current) use of systemic steroids: Secondary | ICD-10-CM | POA: Diagnosis not present

## 2023-11-10 DIAGNOSIS — D591 Autoimmune hemolytic anemia, unspecified: Secondary | ICD-10-CM

## 2023-11-10 DIAGNOSIS — N1831 Chronic kidney disease, stage 3a: Secondary | ICD-10-CM | POA: Diagnosis present

## 2023-11-10 DIAGNOSIS — Z87891 Personal history of nicotine dependence: Secondary | ICD-10-CM | POA: Insufficient documentation

## 2023-11-10 DIAGNOSIS — R768 Other specified abnormal immunological findings in serum: Secondary | ICD-10-CM | POA: Diagnosis not present

## 2023-11-10 LAB — CBC WITH DIFFERENTIAL (CANCER CENTER ONLY)
Abs Immature Granulocytes: 0.01 10*3/uL (ref 0.00–0.07)
Basophils Absolute: 0.1 10*3/uL (ref 0.0–0.1)
Basophils Relative: 1 %
Eosinophils Absolute: 0.1 10*3/uL (ref 0.0–0.5)
Eosinophils Relative: 2 %
HCT: 31.2 % — ABNORMAL LOW (ref 36.0–46.0)
Hemoglobin: 9.9 g/dL — ABNORMAL LOW (ref 12.0–15.0)
Immature Granulocytes: 0 %
Lymphocytes Relative: 20 %
Lymphs Abs: 1 10*3/uL (ref 0.7–4.0)
MCH: 32.8 pg (ref 26.0–34.0)
MCHC: 31.7 g/dL (ref 30.0–36.0)
MCV: 103.3 fL — ABNORMAL HIGH (ref 80.0–100.0)
Monocytes Absolute: 0.3 10*3/uL (ref 0.1–1.0)
Monocytes Relative: 7 %
Neutro Abs: 3.4 10*3/uL (ref 1.7–7.7)
Neutrophils Relative %: 70 %
Platelet Count: 140 10*3/uL — ABNORMAL LOW (ref 150–400)
RBC: 3.02 MIL/uL — ABNORMAL LOW (ref 3.87–5.11)
RDW: 13.7 % (ref 11.5–15.5)
WBC Count: 4.8 10*3/uL (ref 4.0–10.5)
nRBC: 0 % (ref 0.0–0.2)

## 2023-11-10 LAB — CMP (CANCER CENTER ONLY)
ALT: 11 U/L (ref 0–44)
AST: 16 U/L (ref 15–41)
Albumin: 4 g/dL (ref 3.5–5.0)
Alkaline Phosphatase: 44 U/L (ref 38–126)
Anion gap: 8 (ref 5–15)
BUN: 30 mg/dL — ABNORMAL HIGH (ref 8–23)
CO2: 26 mmol/L (ref 22–32)
Calcium: 9.2 mg/dL (ref 8.9–10.3)
Chloride: 105 mmol/L (ref 98–111)
Creatinine: 2.04 mg/dL — ABNORMAL HIGH (ref 0.44–1.00)
GFR, Estimated: 27 mL/min — ABNORMAL LOW (ref >60)
Glucose, Bld: 111 mg/dL — ABNORMAL HIGH (ref 70–99)
Potassium: 3.9 mmol/L (ref 3.5–5.1)
Sodium: 139 mmol/L (ref 135–145)
Total Bilirubin: 1 mg/dL (ref 0.0–1.2)
Total Protein: 6.8 g/dL (ref 6.5–8.1)

## 2023-11-10 MED ORDER — EPOETIN ALFA 20000 UNIT/ML IJ SOLN
20000.0000 [IU] | Freq: Once | INTRAMUSCULAR | Status: AC
  Administered 2023-11-10: 20000 [IU] via INTRAVENOUS
  Filled 2023-11-10: qty 1

## 2023-11-10 NOTE — Progress Notes (Signed)
 Hematology/Oncology Progress note [phone] [phone]      Clinic Day:  11/10/2023  ASSESSMENT & PLAN:   Autoimmune hemolytic anemia (HCC) LDH is normal, adequate B12 and folate level, normal iron panel.  There is increased reticulocyte %-5%.  Cold agglutinin titer negative. Bilirubin is normal. 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  12/22/21 Negative PET scan  05/25/22 Bone marrow biopsy did not reveal any lymphoproliferative diseases. Mild hypocellular marrow, erythroid hyperplasia, mild evidence of spherocytosis, consistent with hemolysis.  S/p Rituximab weekly x 4  Labs are reviewed and discussed with patient. Hemoglobin is stable.   Anemia in chronic kidney disease (CKD) Lab Results  Component Value Date   HGB 9.9 (L) 11/10/2023   TIBC 314 05/14/2022   IRONPCTSAT 26 05/14/2022   FERRITIN 329 (H) 05/14/2022    Recommend Epogen 20,000 units monthly if Hb <11  CKD (chronic kidney disease) stage 3, GFR 30-59 ml/min (HCC) Encourage oral hydration.  Avoid nephrotoxins.    Hepatitis B core antibody positive History of hepatitis B previously on entecavir after her previous rituximab treatments.  Recommend patient to continue Entecavir 0.5mg  Per ID recommendation  for GFR >=30, take every other day For GFR <30, take every 72 hours.     Orders Placed This Encounter  Procedures   CBC with Differential (Cancer Center Only)    Standing Status:   Future    Expected Date:   12/08/2023    Expiration Date:   11/09/2024   CBC with Differential (Cancer Center Only)    Standing Status:   Future    Expected Date:   01/05/2024    Expiration Date:   11/09/2024   CBC with Differential (Cancer Center Only)    Standing Status:   Future    Expected Date:   02/10/2024    Expiration Date:   11/09/2024   Basic Metabolic Panel -  Cancer Center Only    Standing Status:   Future    Expected Date:   12/08/2023    Expiration Date:   11/09/2024   Basic Metabolic Panel - Cancer Center Only    Standing Status:   Future    Expected Date:   01/05/2024    Expiration Date:   11/09/2024   CMP (Cancer Center only)    Standing Status:   Future    Expected Date:   02/10/2024    Expiration Date:   11/09/2024   CBC (Cancer Center Only)    Standing Status:   Future    Expected Date:   12/08/2023    Expiration Date:   11/09/2024    Follow up  lab in 4 weeks + Epogen -cbc bmp 8 weeks, lab + Epogen - cbc bmp 3 months ab MD + Epogen - cbc cmp   All questions were answered. The patient knows to call the clinic with any problems, questions or concerns.  [name], MD, PhD [facility] Hematology Oncology 11/10/2023   Chief Complaint: Tina Page is a 68 y.o. 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  Reticulocyte count was 11.3%.  Ferritin was 562.  Iron studies revealed a saturation of 20% and a TIBC of 214 (low).  B12 was 231 (low normal).  Folate was 42.   Peripheral smear revealed rouleaux formation.    Additional testing included the following + studies:  hepatitis C antibody, hepatitis B core antibody, CMV IgM, and EBV VCA (IgM and IgG).  Hepatitis B by PCR was negative.  Hepatitis C RNA was negative.  Mycoplasma pneumonia IgM was negative.  Reticulocyte count was 11.3% (high) indicating appropriate marrow response.  C3 and C4 were normal.  Cold agglutinin titer was negative x 2.  Negative studies included:  ANA, hepatitis B surface antigen, SPEP, and free light chain ratio.   There was a polyclonal  gammopathy (IgM, kappa and lambda typing increased).  MMA was initially 330 (normal).  Repeat MMA was elevated on 08/01/2016 confirming B12 deficiency.  Hepatitis B surface antibody was positive and hepatitis B surface antigen was negative on 08/06/2016.   07/07/2016  Chest, abdomen, and pelvis CT angiogram on revealed moderate diffuse atherosclerotic vascular disease of the abdominal aorta with severe stenosis of the left common iliac artery with suspected short segment occlusion. There was no adenopathy.  Spleen was normal.   04/15/2017 Abd US revealed a normal spleen and sludge in the gallbladder.  The liver was echogenic consistent fatty infiltration and/or hepatocellular disease.  For hemolytic anemia treatments, in 2017, She has received multiple units of  warmed PRBCs to date.  08/01/2016-03/12/2017, steroid treatment with prednisone.  She is also on folic acid 1 mg daily. Borderline low vitamin B12 level on 07/09/2016.  Patient has been on oral vitamin B12 supplementation.  10/12/2017  positive warm autoantibody.  LDH and bilirubin were normal.   She began prednisone 10 mg on 12/07/2017 (discontinued 03/2018).  She received Rituxan weekly x 4 (last dose 06/16/2018).  Entecavir was discontinued on 07/06/2019.  06/12/2020 developed recurrent anemia.  Hematocrit was 27.6, hemoglobin 8.4, MCV 104.5, platelets 138,000, WBC 3,400 (ANC 2,300). Creatinine was 1.17 (CrCl 50 ml/min).  Positive warm autoantibody  Reticulocyte count was 7.1%.  LDH was 204 (98-192).  06/16/2020, began prednisone 1 mg/kilogram treatments, 07/24/2020, prednisone was tapered down to 20 mg.  She received Rituxan 1000 mg on day 1 and 15 (07/15/2020 and 07/29/2020).  She began entecavir on 06/28/2020.  Prednisone was tapered off.  She was on Septra for PCP prophylaxis which is now currently on hold    Other medical problems 07/10/2016.  She underwent PTCA and stent placement in right and left common iliac arteries and left  external iliac artery. She is on Plavix.   03/19/2015 EGD revealed gastritis in the body and antrum.  Colonoscopy revealed one hyperplastic polyp.  Repeat EGD on 07/08/2016 was normal.  No evidence of bleeding.  History of she developed peri-oral and intranasal herpes simplex-1.  She was treated with valacyclovir and doxycycline on 10/19/2016.   10/26/2017. She developed flu like symptoms.  Symptoms included cough, myalgias, and fever (tmax unknown).  She was prescribed Mucinex, Tamiflu, and amoxicillin.  She only took amoxicillin x 5 days.  She developed increased liver function tests on 11/02/2017. CMV IgG was positive.  EBV VCA IgG, NA IgG, early antigen antibody IgG were elevated.  EBV VCA IgM was < 36.  Testing was c/w a convalescence/past infection or reactivated infection.  LFTs normalized on 11/15/2017.  Smooth muscle antibody was 39 (high) on 11/10/2017 and 34 (high) on 11/30/2017.     Chest CT on 11/16/2017 revealed a 2.2 x 2.0 x 2.4  spiculated RIGHT middle lobe mass.  There was tiny nonspecific upper lobe pulmonary nodules greater on RIGHT, largest 3 mm, of uncertain etiology.  There was additional 8 x 8 mm opacity in the posterior sulcus of the RIGHT lower lobe. PET scan on 11/22/2017 revealed a 2 cm hypermetabolic right middle lobe lung mass (SUV 3.4) consistent  with primary lung neoplasm.  There were no findings for mediastinal/hilar lymphadenopathy or metastatic disease.  There were areas of hypermetabolism involving the left oblique abdominal muscles and the anorectal junction. PFTs on 11/18/2017 revealed an FEV1 of 1.22 liters (51%).  DLCO adj was 6.8 mL/mmHg/min (32%).   ENB done on 12/07/2017. Cytology was negative for malignancy. Pathology demonstrated organizing pneumonia and chronic bronchitis. Pathologist commented that organizing pneumonia spanned about 2 mm in one fragment. Cases of focal organizing pneumonia with hypermetabolism on FDG-PET have been described, but changes of  this type can also be adjacent to a neoplasm. Patient prescribed a daily dose of Prednisone 10 mg   07/24/2020  repeat  chest CT on  revealed Lung-RADS 2, benign appearance or behavior. There was emphysema, aortic atherosclerosis, and coronary artery calcifications.  diarrhea.  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  She received azithromycin x 3 days.  She received ciprofloxacin x 10 days without improvement.  She has seen ID in [place].  She began Bactrim on 03/04/2018 (discontinued on 03/11/2018) secondary to increase in creatine.   Chronic kidney disease Urinalysis on 11/15/2017 revealed revealed no hemoglobin, bilirubin or active sediment.   Liver function tests increased on 02/15/2019.  Work-up on 02/16/2019 revealed hepatitis B E antibody positive.  Negative studies included: hepatitis A total antibody, hepatitis A IgM, hepatitis B surface antigen, hepatitis B E antigen.  Hepatitis B DNA was not detected.  Folate was 80.5 and vitamin B12 was 443.  She has received her vaccinations:  She has completed the PCV13, PPSV23, and HiB.  She received Menvio (quadrivalent meningoccal vaccine) and Bexero (univalent serogroup B meningoccal vaccine) on 07/06/2019.  She was diagnosed with C difficile + diarrhea on 08/12/2020.  She completed a course of oral vancomycin.  Stool was negative for C. difficile on 08/27/2020.  11/17/2020 - 11/20/2020 [facility] admission with left L5 varicella zoster.  Lesion was positive for VZV. She was treated with ceftazidime and a valacyclovir.  She was discharged on 9 days of Valtrex 1 gm 3 times daily followed by 500 mg daily indefinitely for suppression and doxycycline.  History of discoid lupus  #Positive hepatitis B core antibody Previously on entecavir 0.5 mg daily, currently she has finished a course. Dose adjusted based on renal function. CrCl 30-49 ml/min   50% dosing (0.5 mg QOD) CrCl >= 50 ml/min 100% dosing (0.5 mg a  day). With her kidney function currently QOD She has completed course of Entecavir and stopped 08/14/2021  # PCP prophylaxis Patient was on Septra DS on Mondays, Wednesdays, and Fridays, which was held due to fluctuated kidney functions.  Currently off Septra.  12/03/2021, CT chest lung cancer screening showed new right middle lobe pulmonary nodule with an aggressive appearance characterized as lung RADS 4 VS.  12/22/2021 PET showed No abnormal FDG avidity associated with the resolving right middle lobe pulmonary nodule now with a 12 mm ground-glass opacity in the area of prior solid nodularity, most consistent with an infectious or inflammatory etiology  INTERVAL HISTORY Tina Page is a 68 y.o. female who has above history reviewed by me today presents for  follow up visit  Off prednisone. Lost weight Chronic fatigue unchanged.  Recent flu infection  She tolerates injections.  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/23/2017   Post PTCA 07/12/2016   iliac stents bilaterally 2017   Thrombocytopenia (HCC) 10/04/2016   Wears dentures    full upper and lower    Past Surgical History:  Procedure Laterality Date   ELECTROMAGNETIC NAVIGATION BROCHOSCOPY N/A 12/07/2017   Procedure: ELECTROMAGNETIC NAVIGATION BRONCHOSCOPY;  Surgeon: Erin Fulling, MD;  Location: [facility] ORS;  Service: Cardiopulmonary;  Laterality: N/A;   ESOPHAGOGASTRODUODENOSCOPY (EGD) WITH PROPOFOL N/A 07/08/2016   Procedure:  ESOPHAGOGASTRODUODENOSCOPY (EGD) WITH PROPOFOL;  Surgeon: Scot Jun, MD;  Location: [facility] ENDOSCOPY;  Service: Endoscopy;  Laterality: N/A;   KNEE SURGERY Right    repair of acl tear   PERIPHERAL VASCULAR CATHETERIZATION N/A 07/10/2016   Procedure: Lower Extremity Angiography;  Surgeon: Renford Dills, MD;  Location: [facility] INVASIVE CV LAB;  Service: Cardiovascular;  Laterality: N/A;   PERIPHERAL VASCULAR CATHETERIZATION N/A 07/10/2016   Procedure: Abdominal Aortogram w/Lower Extremity;  Surgeon: Renford Dills, MD;  Location: [facility] INVASIVE CV LAB;  Service: Cardiovascular;  Laterality: N/A;   PERIPHERAL VASCULAR CATHETERIZATION  07/10/2016   Procedure: Lower Extremity Intervention;  Surgeon: Renford Dills, MD;  Location: [facility] INVASIVE CV LAB;  Service: Cardiovascular;;    Family History  Problem Relation Age of Onset   Diabetes Mother    Hypertension Mother    Diabetes Maternal Grandfather    Hypertension Maternal Grandfather    Breast cancer Neg Hx     Social History:  reports that she quit smoking about 7 years ago. Her smoking use included cigarettes. She started smoking about 54 years ago. She has a 58.8 pack-year smoking history. She has never used smokeless tobacco. She reports that she does not drink alcohol and does not use drugs.  Former smoker, 58-pack-year smoking history.   She has a daughter, Yehuda Mao and a daughter named [name].  She lives in [place]. The patient is alone today.  Allergies:  Allergies  Allergen Reactions   Ciprofloxacin Swelling    Facial swelling following single oral dose.    Codeine Anaphylaxis   Nsaids Other (See Comments)    Patient has Hep C.     Current Medications: Current Outpatient Medications  Medication Sig Dispense Refill   albuterol (VENTOLIN HFA) 108 (90 Base) MCG/ACT inhaler SMARTSIG:2 Puff(s) By Mouth Every 4 Hours PRN     allopurinol (ZYLOPRIM) 100 MG tablet TAKE 1 TABLET BY MOUTH EVERY DAY 30 tablet 5    Budeson-Glycopyrrol-Formoterol (BREZTRI AEROSPHERE) 160-9-4.8 MCG/ACT AERO Inhale 2 puffs into the lungs in the morning and at bedtime. 3 each    CALCIUM PO Take by mouth daily at 12 noon.     clopidogrel (PLAVIX) 75 MG tablet Take 1 tablet (75 mg total) by mouth daily. 30 tablet 5   cyanocobalamin 500 MCG tablet Take 500 mcg by mouth daily.     Ensifentrine (OHTUVAYRE)  3 MG/2.5ML SUSP Take 3 mg by nebulization 2 (two) times daily.     entecavir (BARACLUDE) 0.5 MG tablet Take 0.5 mg by mouth every other day.     hydroxychloroquine (PLAQUENIL) 200 MG tablet Take 200 mg by mouth 2 (two) times daily.     levothyroxine (SYNTHROID, LEVOTHROID) 75 MCG tablet Take 75 mcg by mouth daily before breakfast.      lisinopril-hydrochlorothiazide (ZESTORETIC) 20-25 MG tablet Take 1 tablet by mouth once daily 90 tablet 3   pantoprazole (PROTONIX) 40 MG tablet Take 1 tablet by mouth once daily 30 tablet 0   rosuvastatin (CRESTOR) 40 MG tablet Take 1 tablet (40 mg total) by mouth daily. 90 tablet 3   VITAMIN D PO Take by mouth daily in the afternoon.     No current facility-administered medications for this visit.    Review of Systems  Constitutional:  Negative for appetite change, chills, fatigue and fever.  HENT:   Negative for hearing loss and voice change.   Eyes:  Negative for eye problems.  Respiratory:  Negative for chest tightness and cough.   Cardiovascular:  Negative for chest pain.  Gastrointestinal:  Negative for abdominal distention, abdominal pain and blood in stool.  Endocrine: Negative for hot flashes.  Genitourinary:  Negative for difficulty urinating and frequency.   Musculoskeletal:  Negative for arthralgias.  Skin:  Negative for itching and rash.  Neurological:  Negative for extremity weakness.  Hematological:  Negative for adenopathy.  Psychiatric/Behavioral:  Negative for confusion. The patient is not nervous/anxious.      Performance status (ECOG): 1  Vital Signs Blood  pressure (!) 116/56, pulse 94, temperature (!) 97 F (36.1 C), temperature source Tympanic, resp. rate 18, weight 181 lb 1.6 oz (82.1 kg), SpO2 100%.  Physical Exam Constitutional:      General: She is not in acute distress.    Appearance: She is obese. She is not diaphoretic.  HENT:     Head: Normocephalic and atraumatic.  Eyes:     General: No scleral icterus. Cardiovascular:     Rate and Rhythm: Normal rate and regular rhythm.     Heart sounds: No murmur heard. Pulmonary:     Effort: Pulmonary effort is normal. No respiratory distress.  Abdominal:     General: There is no distension.     Palpations: Abdomen is soft.     Tenderness: There is no abdominal tenderness.  Musculoskeletal:        General: Normal range of motion.     Cervical back: Normal range of motion and neck supple.  Skin:    General: Skin is warm and dry.     Findings: No erythema.  Neurological:     Mental Status: She is alert and oriented to person, place, and time. Mental status is at baseline.     Motor: No abnormal muscle tone.  Psychiatric:        Mood and Affect: Mood and affect normal.      Laboratory findings     Latest Ref Rng & Units 11/10/2023   10:36 AM 10/07/2023   11:07 AM 09/09/2023   11:25 AM  CBC  WBC 4.0 - 10.5 K/uL 4.8  4.5  5.4   Hemoglobin 12.0 - 15.0 g/dL 9.9  9.4  11.9   Hematocrit 36.0 - 46.0 % 31.2  29.6  31.0   Platelets 150 - 400 K/uL 140  167  196       Latest Ref Rng & Units 11/10/2023  10:36 AM 10/07/2023   11:07 AM 09/09/2023   11:25 AM  CMP  Glucose 70 - 99 mg/dL 161  95  096   BUN 8 - 23 mg/dL 30  21  35   Creatinine 0.44 - 1.00 mg/dL 0.45  4.09  8.11   Sodium 135 - 145 mmol/L 139  140  140   Potassium 3.5 - 5.1 mmol/L 3.9  3.9  3.8   Chloride 98 - 111 mmol/L 105  102  104   CO2 22 - 32 mmol/L 26  27  25    Calcium 8.9 - 10.3 mg/dL 9.2  9.5  9.7   Total Protein 6.5 - 8.1 g/dL 6.8     Total Bilirubin 0.0 - 1.2 mg/dL 1.0     Alkaline Phos 38 - 126 U/L 44     AST  15 - 41 U/L 16     ALT 0 - 44 U/L 11

## 2023-11-10 NOTE — Assessment & Plan Note (Signed)
 Lab Results  Component Value Date   HGB 9.9 (L) 11/10/2023   TIBC 314 05/14/2022   IRONPCTSAT 26 05/14/2022   FERRITIN 329 (H) 05/14/2022    Recommend Epogen 20,000 units monthly if Hb <11

## 2023-11-10 NOTE — Assessment & Plan Note (Addendum)
 LDH is normal, adequate B12 and folate level, normal iron panel.  There is increased reticulocyte %-5%.  Cold agglutinin titer negative. Bilirubin is normal. 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  12/22/21 Negative PET scan  05/25/22 Bone marrow biopsy did not reveal any lymphoproliferative diseases. Mild hypocellular marrow, erythroid hyperplasia, mild evidence of spherocytosis, consistent with hemolysis.  S/p Rituximab weekly x 4  Labs are reviewed and discussed with patient. Hemoglobin is stable.

## 2023-11-10 NOTE — Assessment & Plan Note (Signed)
History of hepatitis B previously on entecavir after her previous rituximab treatments.  Recommend patient to continue Entecavir 0.5mg  Per ID recommendation  for GFR >=30, take every other day For GFR <30, take every 72 hours.

## 2023-11-10 NOTE — Assessment & Plan Note (Signed)
Encourage oral hydration. Avoid nephrotoxins. 

## 2023-11-11 ENCOUNTER — Other Ambulatory Visit: Payer: Self-pay

## 2023-11-19 ENCOUNTER — Other Ambulatory Visit: Payer: Self-pay | Admitting: Pulmonary Disease

## 2023-12-06 ENCOUNTER — Ambulatory Visit (INDEPENDENT_AMBULATORY_CARE_PROVIDER_SITE_OTHER): Payer: BC Managed Care – PPO | Admitting: Vascular Surgery

## 2023-12-06 ENCOUNTER — Encounter (INDEPENDENT_AMBULATORY_CARE_PROVIDER_SITE_OTHER): Payer: BC Managed Care – PPO

## 2023-12-06 ENCOUNTER — Other Ambulatory Visit: Payer: Self-pay

## 2023-12-08 ENCOUNTER — Other Ambulatory Visit

## 2023-12-08 ENCOUNTER — Ambulatory Visit

## 2023-12-13 ENCOUNTER — Other Ambulatory Visit (INDEPENDENT_AMBULATORY_CARE_PROVIDER_SITE_OTHER): Payer: Self-pay | Admitting: Vascular Surgery

## 2023-12-13 DIAGNOSIS — Z9889 Other specified postprocedural states: Secondary | ICD-10-CM

## 2023-12-15 ENCOUNTER — Inpatient Hospital Stay

## 2023-12-15 ENCOUNTER — Inpatient Hospital Stay: Attending: Oncology

## 2023-12-15 VITALS — BP 113/54 | HR 84

## 2023-12-15 DIAGNOSIS — R768 Other specified abnormal immunological findings in serum: Secondary | ICD-10-CM

## 2023-12-15 DIAGNOSIS — N1832 Chronic kidney disease, stage 3b: Secondary | ICD-10-CM

## 2023-12-15 DIAGNOSIS — N1831 Chronic kidney disease, stage 3a: Secondary | ICD-10-CM | POA: Diagnosis present

## 2023-12-15 DIAGNOSIS — D631 Anemia in chronic kidney disease: Secondary | ICD-10-CM | POA: Insufficient documentation

## 2023-12-15 DIAGNOSIS — D591 Autoimmune hemolytic anemia, unspecified: Secondary | ICD-10-CM

## 2023-12-15 LAB — CBC WITH DIFFERENTIAL (CANCER CENTER ONLY)
Abs Immature Granulocytes: 0.03 10*3/uL (ref 0.00–0.07)
Basophils Absolute: 0.1 10*3/uL (ref 0.0–0.1)
Basophils Relative: 1 %
Eosinophils Absolute: 0.1 10*3/uL (ref 0.0–0.5)
Eosinophils Relative: 3 %
HCT: 28.8 % — ABNORMAL LOW (ref 36.0–46.0)
Hemoglobin: 9.2 g/dL — ABNORMAL LOW (ref 12.0–15.0)
Immature Granulocytes: 1 %
Lymphocytes Relative: 18 %
Lymphs Abs: 0.9 10*3/uL (ref 0.7–4.0)
MCH: 34.6 pg — ABNORMAL HIGH (ref 26.0–34.0)
MCHC: 31.9 g/dL (ref 30.0–36.0)
MCV: 108.3 fL — ABNORMAL HIGH (ref 80.0–100.0)
Monocytes Absolute: 0.3 10*3/uL (ref 0.1–1.0)
Monocytes Relative: 7 %
Neutro Abs: 3.5 10*3/uL (ref 1.7–7.7)
Neutrophils Relative %: 70 %
Platelet Count: 180 10*3/uL (ref 150–400)
RBC: 2.66 MIL/uL — ABNORMAL LOW (ref 3.87–5.11)
RDW: 15.9 % — ABNORMAL HIGH (ref 11.5–15.5)
WBC Count: 4.9 10*3/uL (ref 4.0–10.5)
nRBC: 0 % (ref 0.0–0.2)

## 2023-12-15 LAB — BASIC METABOLIC PANEL - CANCER CENTER ONLY
Anion gap: 8 (ref 5–15)
BUN: 37 mg/dL — ABNORMAL HIGH (ref 8–23)
CO2: 23 mmol/L (ref 22–32)
Calcium: 9.1 mg/dL (ref 8.9–10.3)
Chloride: 108 mmol/L (ref 98–111)
Creatinine: 2.09 mg/dL — ABNORMAL HIGH (ref 0.44–1.00)
GFR, Estimated: 26 mL/min — ABNORMAL LOW (ref >60)
Glucose, Bld: 101 mg/dL — ABNORMAL HIGH (ref 70–99)
Potassium: 4.2 mmol/L (ref 3.5–5.1)
Sodium: 139 mmol/L (ref 135–145)

## 2023-12-15 LAB — IRON AND TIBC
Iron: 99 ug/dL (ref 28–170)
Saturation Ratios: 31 % (ref 10.4–31.8)
TIBC: 323 ug/dL (ref 250–450)
UIBC: 224 ug/dL

## 2023-12-15 LAB — FERRITIN: Ferritin: 103 ng/mL (ref 11–307)

## 2023-12-15 MED ORDER — EPOETIN ALFA 20000 UNIT/ML IJ SOLN
20000.0000 [IU] | Freq: Once | INTRAMUSCULAR | Status: AC
  Administered 2023-12-15: 20000 [IU] via INTRAVENOUS
  Filled 2023-12-15: qty 1

## 2023-12-16 ENCOUNTER — Ambulatory Visit (INDEPENDENT_AMBULATORY_CARE_PROVIDER_SITE_OTHER): Payer: BC Managed Care – PPO

## 2023-12-16 ENCOUNTER — Ambulatory Visit (INDEPENDENT_AMBULATORY_CARE_PROVIDER_SITE_OTHER): Payer: BC Managed Care – PPO | Admitting: Vascular Surgery

## 2023-12-16 ENCOUNTER — Encounter: Payer: Self-pay | Admitting: Oncology

## 2023-12-16 ENCOUNTER — Encounter (INDEPENDENT_AMBULATORY_CARE_PROVIDER_SITE_OTHER): Payer: Self-pay | Admitting: Vascular Surgery

## 2023-12-16 VITALS — BP 108/67 | HR 87 | Resp 18 | Ht 63.0 in | Wt 180.2 lb

## 2023-12-16 DIAGNOSIS — Z9889 Other specified postprocedural states: Secondary | ICD-10-CM | POA: Diagnosis not present

## 2023-12-16 DIAGNOSIS — I10 Essential (primary) hypertension: Secondary | ICD-10-CM | POA: Diagnosis not present

## 2023-12-16 DIAGNOSIS — I739 Peripheral vascular disease, unspecified: Secondary | ICD-10-CM | POA: Diagnosis not present

## 2023-12-16 DIAGNOSIS — E782 Mixed hyperlipidemia: Secondary | ICD-10-CM | POA: Diagnosis not present

## 2023-12-17 ENCOUNTER — Other Ambulatory Visit: Payer: Self-pay

## 2023-12-20 ENCOUNTER — Other Ambulatory Visit: Payer: Self-pay

## 2023-12-20 ENCOUNTER — Ambulatory Visit
Admission: RE | Admit: 2023-12-20 | Discharge: 2023-12-20 | Disposition: A | Discharge status: Home / Self Care | Payer: Medicare HMO | Source: Ambulatory Visit | Attending: Family Medicine | Admitting: Family Medicine

## 2023-12-20 ENCOUNTER — Other Ambulatory Visit: Payer: Self-pay | Admitting: Pulmonary Disease

## 2023-12-20 DIAGNOSIS — K219 Gastro-esophageal reflux disease without esophagitis: Secondary | ICD-10-CM

## 2023-12-20 DIAGNOSIS — Z1231 Encounter for screening mammogram for malignant neoplasm of breast: Secondary | ICD-10-CM | POA: Insufficient documentation

## 2023-12-20 LAB — VAS US ABI WITH/WO TBI
Left ABI: 0.99
Right ABI: 0.99

## 2023-12-20 NOTE — Telephone Encounter (Signed)
 Pt has upcoming appt on 01/07/24 @10  am

## 2023-12-20 NOTE — Progress Notes (Signed)
 Subjective:    Patient ID: Tina Page, female    DOB: 1956-03-22, 68 y.o.   MRN: [id number] Chief Complaint  Patient presents with   Follow-up    29yr ABI/consult.    The patient returns to the office for followup regarding atherosclerotic changes of the lower extremities and review of the noninvasive studies.   There have been no interval changes in lower extremity symptoms. No interval shortening of the patient's claudication distance or development of rest pain symptoms. No new ulcers or wounds have occurred since the last visit.  There have been no significant changes to the patient's overall health care.  The patient denies amaurosis fugax or recent TIA symptoms. There are no documented recent neurological changes noted. There is no history of DVT, PE or superficial thrombophlebitis. The patient denies recent episodes of angina or shortness of breath.   ABI Rt=.99 and Lt=.99  (previous ABI's Rt=1.01 and Lt=1.04) Duplex ultrasound of the right lower extremity with biphasic normal waveforms and left lower extremity with triphasic normal waveforms.    Review of Systems  All other systems reviewed and are negative.      Objective:   Physical Exam Vitals reviewed.  Constitutional:      Appearance: Normal appearance.  HENT:     Head: Normocephalic.  Cardiovascular:     Rate and Rhythm: Normal rate and regular rhythm.     Pulses:          Dorsalis pedis pulses are 2+ on the right side and 2+ on the left side.       Posterior tibial pulses are 2+ on the right side and 2+ on the left side.  Pulmonary:     Effort: Pulmonary effort is normal.  Skin:    General: Skin is warm and dry.  Neurological:     Mental Status: She is alert and oriented to person, place, and time.  Psychiatric:        Mood and Affect: Mood normal.        Behavior: Behavior normal.        Thought Content: Thought content normal.        Judgment: Judgment normal.     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   Post PTCA 07/12/2016   iliac stents bilaterally 2017   Thrombocytopenia (HCC) 10/04/2016   Wears dentures    full upper and lower    Social History   Socioeconomic History   Marital status: Widowed    Spouse name: Not on file   Number of children: Not on file   Years of education: Not on file   Highest education level: Not on file  Occupational History   Not on file  Tobacco Use   Smoking status: Former  Current packs/day: 0.00    Average packs/day: 1.3 packs/day for 47.0 years (58.8 ttl pk-yrs)    Types: Cigarettes    Start date: 07/11/1969    Quit date: 07/11/2016    Years since quitting: 7.4   Smokeless tobacco: Never  Vaping Use   Vaping status: Never Used  Substance and Sexual Activity   Alcohol use: No   Drug use: No   Sexual activity: Not Currently    Birth control/protection: None  Other Topics Concern   Not on file  Social History Narrative   Not on file   Social Drivers of Health   Financial Resource Strain: Not on file  Food Insecurity: Not on file  Transportation Needs: Not on file  Physical Activity: Not on file  Stress: Not on file  Social Connections: Not on file  Intimate Partner Violence: Not on file    Past Surgical History:  Procedure Laterality Date   ELECTROMAGNETIC NAVIGATION BROCHOSCOPY N/A 12/07/2017   Procedure:  ELECTROMAGNETIC NAVIGATION BRONCHOSCOPY;  Surgeon: Cleve Dale, MD;  Location: [facility] ORS;  Service: Cardiopulmonary;  Laterality: N/A;   ESOPHAGOGASTRODUODENOSCOPY (EGD) WITH PROPOFOL  N/A 07/08/2016   Procedure: ESOPHAGOGASTRODUODENOSCOPY (EGD) WITH PROPOFOL ;  Surgeon: Cassie Click, MD;  Location: [facility];  Service: Endoscopy;  Laterality: N/A;   KNEE SURGERY Right    repair of acl tear   PERIPHERAL VASCULAR CATHETERIZATION N/A 07/10/2016   Procedure: Lower Extremity Angiography;  Surgeon: Jackquelyn Mass, MD;  Location: [facility];  Service: Cardiovascular;  Laterality: N/A;   PERIPHERAL VASCULAR CATHETERIZATION N/A 07/10/2016   Procedure: Abdominal Aortogram w/Lower Extremity;  Surgeon: Jackquelyn Mass, MD;  Location: [facility];  Service: Cardiovascular;  Laterality: N/A;   PERIPHERAL VASCULAR CATHETERIZATION  07/10/2016   Procedure: Lower Extremity Intervention;  Surgeon: Jackquelyn Mass, MD;  Location: [facility];  Service: Cardiovascular;;    Family History  Problem Relation Age of Onset   Diabetes Mother    Hypertension Mother    Diabetes Maternal Grandfather    Hypertension Maternal Grandfather    Breast cancer Neg Hx     Allergies  Allergen Reactions   Ciprofloxacin  Swelling    Facial swelling following single oral dose.    Codeine Anaphylaxis   Nsaids Other (See Comments)    Patient has Hep C.        Latest Ref Rng & Units 12/15/2023   10:42 AM 11/10/2023   10:36 AM 10/07/2023   11:07 AM  CBC  WBC 4.0 - 10.5 K/uL 4.9  4.8  4.5   Hemoglobin 12.0 - 15.0 g/dL 9.2  9.9  9.4   Hematocrit 36.0 - 46.0 % 28.8  31.2  29.6   Platelets 150 - 400 K/uL 180  140  167       CMP     Component Value Date/Time   NA 139 12/15/2023 1042   NA 142 06/11/2014 1732   K 4.2 12/15/2023 1042   K 3.1 (L) 06/11/2014 1732   CL 108 12/15/2023 1042   CL 103 06/11/2014 1732   CO2 23 12/15/2023 1042   CO2 32 06/11/2014 1732   GLUCOSE 101 (H)  12/15/2023 1042   GLUCOSE 100 (H) 06/11/2014 1732   BUN 37 (H) 12/15/2023 1042   BUN 15 06/11/2014 1732   CREATININE 2.09 (H) 12/15/2023 1042   CREATININE 0.73 06/11/2014 1732   CALCIUM  9.1 12/15/2023 1042   CALCIUM  8.9 06/11/2014 1732   PROT 6.8 11/10/2023 1036  PROT 6.8 04/18/2019 1328   PROT 7.8 06/11/2014 1732   ALBUMIN 4.0 11/10/2023 1036   ALBUMIN 4.2 04/18/2019 1328   ALBUMIN 3.6 06/11/2014 1732   AST 16 11/10/2023 1036   ALT 11 11/10/2023 1036   ALT 28 06/11/2014 1732   ALKPHOS 44 11/10/2023 1036   ALKPHOS 101 06/11/2014 1732   BILITOT 1.0 11/10/2023 1036   GFRNONAA 26 (L) 12/15/2023 1042   GFRNONAA >60 06/11/2014 1732     VAS US  ABI WITH/WO TBI Result Date: 12/20/2023  LOWER EXTREMITY DOPPLER STUDY Patient Name:  MELISS FLEEK  Date of Exam:   12/16/2023 Medical Rec #: [id number]          Accession #:    [id number] Date of Birth: October 03, 1955           Patient Gender: F Patient Age:   62 years Exam Location:  [facility] Procedure:      VAS US  ABI WITH/WO TBI Referring Phys: Devon Fogo --------------------------------------------------------------------------------  Indications: Peripheral artery disease.  Vascular Interventions: 08/15/18 Bilateral CIA stents. Comparison Study: 12/03/2022 Performing Technologist: Tonie Franks RVS  Examination Guidelines: A complete evaluation includes at minimum, Doppler waveform signals and systolic blood pressure reading at the level of bilateral brachial, anterior tibial, and posterior tibial arteries, when vessel segments are accessible. Bilateral testing is considered an integral part of a complete examination. Photoelectric Plethysmograph (PPG) waveforms and toe systolic pressure readings are included as required and additional duplex testing as needed. Limited examinations for reoccurring indications may be performed as noted.  ABI Findings: +---------+------------------+-----+--------+--------+ Right    Rt Pressure  (mmHg)IndexWaveformComment  +---------+------------------+-----+--------+--------+ Brachial 122                                     +---------+------------------+-----+--------+--------+ ATA      110               0.90 biphasic         +---------+------------------+-----+--------+--------+ PTA      121               0.99 biphasic         +---------+------------------+-----+--------+--------+ Great Toe100               0.82                  +---------+------------------+-----+--------+--------+ +---------+------------------+-----+---------+-------+ Left     Lt Pressure (mmHg)IndexWaveform Comment +---------+------------------+-----+---------+-------+ Brachial 122                                     +---------+------------------+-----+---------+-------+ ATA      118               0.97 triphasic        +---------+------------------+-----+---------+-------+ PTA      121               0.99 triphasic        +---------+------------------+-----+---------+-------+ Great Toe129               1.06 Normal           +---------+------------------+-----+---------+-------+ +-------+-----------+-----------+------------+------------+ ABI/TBIToday's ABIToday's TBIPrevious ABIPrevious TBI +-------+-----------+-----------+------------+------------+ Right  .99        .82        1.01        .77          +-------+-----------+-----------+------------+------------+  Left   .99        1.06       1.04        .88          +-------+-----------+-----------+------------+------------+  Bilateral ABIs appear essentially unchanged compared to prior study on 12/03/2022. Bilateral TBIs appear increased compared to prior study on 12/03/2022.  Summary: Right: Resting right ankle-brachial index is within normal range. The right toe-brachial index is normal. Left: Resting left ankle-brachial index is within normal range. The left toe-brachial index is normal. *See table(s) above for  measurements and observations.  Electronically signed by Devon Fogo MD on 12/20/2023 at 1:43:37 PM.    Final        Assessment & Plan:   1. PAD (peripheral artery disease) (HCC) (Primary)  Recommend:  The patient has evidence of atherosclerosis of the lower extremities with  no claudication.  The patient does not voice lifestyle limiting changes at this point in time.  Noninvasive studies do not suggest clinically significant change.  No invasive studies, angiography or surgery at this time The patient should continue walking and begin a more formal exercise program.  The patient should continue antiplatelet therapy and aggressive treatment of the lipid abnormalities  No changes in the patient's medications at this time  Continued surveillance is indicated as atherosclerosis is likely to progress with time.    The patient will continue follow up with noninvasive studies as ordered.  - VAS US  ABI WITH/WO TBI; Future  2. Essential hypertension Continue antihypertensive medications as already ordered, these medications have been reviewed and there are no changes at this time.  3. Mixed hyperlipidemia Continue statin as ordered and reviewed, no changes at this time   Current Outpatient Medications on File Prior to Visit  Medication Sig Dispense Refill   albuterol  (VENTOLIN  HFA) 108 (90 Base) MCG/ACT inhaler SMARTSIG:2 Puff(s) By Mouth Every 4 Hours PRN     allopurinol  (ZYLOPRIM ) 100 MG tablet TAKE 1 TABLET BY MOUTH EVERY DAY 30 tablet 5   Budeson-Glycopyrrol-Formoterol (BREZTRI  AEROSPHERE) 160-9-4.8 MCG/ACT AERO Inhale 2 puffs into the lungs in the morning and at bedtime. 3 each    CALCIUM  PO Take by mouth daily at 12 noon.     clopidogrel  (PLAVIX ) 75 MG tablet Take 1 tablet (75 mg total) by mouth daily. 30 tablet 5   cyanocobalamin  500 MCG tablet Take 500 mcg by mouth daily.     Ensifentrine  (OHTUVAYRE ) 3 MG/2.5ML SUSP Take 3 mg by nebulization 2 (two) times daily.      entecavir  (BARACLUDE ) 0.5 MG tablet Take 0.5 mg by mouth every other day.     hydroxychloroquine  (PLAQUENIL ) 200 MG tablet Take 200 mg by mouth 2 (two) times daily.     levothyroxine  (SYNTHROID , LEVOTHROID) 75 MCG tablet Take 75 mcg by mouth daily before breakfast.      lisinopril -hydrochlorothiazide  (ZESTORETIC ) 20-25 MG tablet Take 1 tablet by mouth once daily 90 tablet 3   rosuvastatin  (CRESTOR ) 40 MG tablet Take 1 tablet (40 mg total) by mouth daily. 90 tablet 3   VITAMIN D PO Take by mouth daily in the afternoon.     No current facility-administered medications on file prior to visit.    There are no Patient Instructions on file for this visit. No follow-ups on file.   Annamaria Barrette, NP

## 2023-12-31 ENCOUNTER — Ambulatory Visit: Payer: Medicare HMO | Admitting: Pulmonary Disease

## 2024-01-05 ENCOUNTER — Inpatient Hospital Stay: Attending: Oncology

## 2024-01-05 ENCOUNTER — Inpatient Hospital Stay

## 2024-01-05 VITALS — BP 117/65

## 2024-01-05 DIAGNOSIS — R768 Other specified abnormal immunological findings in serum: Secondary | ICD-10-CM

## 2024-01-05 DIAGNOSIS — D631 Anemia in chronic kidney disease: Secondary | ICD-10-CM | POA: Diagnosis present

## 2024-01-05 DIAGNOSIS — N1831 Chronic kidney disease, stage 3a: Secondary | ICD-10-CM | POA: Insufficient documentation

## 2024-01-05 DIAGNOSIS — D591 Autoimmune hemolytic anemia, unspecified: Secondary | ICD-10-CM

## 2024-01-05 DIAGNOSIS — N1832 Chronic kidney disease, stage 3b: Secondary | ICD-10-CM

## 2024-01-05 LAB — BASIC METABOLIC PANEL - CANCER CENTER ONLY
Anion gap: 10 (ref 5–15)
BUN: 26 mg/dL — ABNORMAL HIGH (ref 8–23)
CO2: 26 mmol/L (ref 22–32)
Calcium: 9.4 mg/dL (ref 8.9–10.3)
Chloride: 100 mmol/L (ref 98–111)
Creatinine: 1.7 mg/dL — ABNORMAL HIGH (ref 0.44–1.00)
GFR, Estimated: 33 mL/min — ABNORMAL LOW (ref >60)
Glucose, Bld: 96 mg/dL (ref 70–99)
Potassium: 4.1 mmol/L (ref 3.5–5.1)
Sodium: 136 mmol/L (ref 135–145)

## 2024-01-05 LAB — CBC (CANCER CENTER ONLY)
HCT: 29 % — ABNORMAL LOW (ref 36.0–46.0)
Hemoglobin: 9.2 g/dL — ABNORMAL LOW (ref 12.0–15.0)
MCH: 33.8 pg (ref 26.0–34.0)
MCHC: 31.7 g/dL (ref 30.0–36.0)
MCV: 106.6 fL — ABNORMAL HIGH (ref 80.0–100.0)
Platelet Count: 182 10*3/uL (ref 150–400)
RBC: 2.72 MIL/uL — ABNORMAL LOW (ref 3.87–5.11)
RDW: 14.6 % (ref 11.5–15.5)
WBC Count: 4 10*3/uL (ref 4.0–10.5)
nRBC: 0 % (ref 0.0–0.2)

## 2024-01-05 MED ORDER — EPOETIN ALFA 20000 UNIT/ML IJ SOLN
20000.0000 [IU] | Freq: Once | INTRAMUSCULAR | Status: AC
  Administered 2024-01-05: 20000 [IU] via INTRAVENOUS
  Filled 2024-01-05: qty 1

## 2024-01-07 ENCOUNTER — Ambulatory Visit: Payer: Medicare HMO | Admitting: Pulmonary Disease

## 2024-01-07 ENCOUNTER — Encounter: Payer: Self-pay | Admitting: Pulmonary Disease

## 2024-01-07 VITALS — BP 124/60 | HR 95 | Temp 97.6°F | Ht 63.0 in | Wt 179.2 lb

## 2024-01-07 DIAGNOSIS — D591 Autoimmune hemolytic anemia, unspecified: Secondary | ICD-10-CM

## 2024-01-07 DIAGNOSIS — J449 Chronic obstructive pulmonary disease, unspecified: Secondary | ICD-10-CM

## 2024-01-07 DIAGNOSIS — Z87891 Personal history of nicotine dependence: Secondary | ICD-10-CM

## 2024-01-07 DIAGNOSIS — M359 Systemic involvement of connective tissue, unspecified: Secondary | ICD-10-CM

## 2024-01-07 DIAGNOSIS — R918 Other nonspecific abnormal finding of lung field: Secondary | ICD-10-CM

## 2024-01-07 NOTE — Progress Notes (Signed)
 Subjective:    Patient ID: Tina Page, female    DOB: 02-11-1956, 68 y.o.   MRN: [id number]  Patient Care Team: Patrina Boos, MD as PCP - General (Family Medicine) Eartha Gold, MD (Infectious Diseases) Drake Gens, RN as Registered Nurse Marnee Sink, MD as Consulting Physician (Gastroenterology) Alica Inks, MD as Consulting Physician (Infectious Diseases) Devorah Fonder, MD as Consulting Physician (Cardiology) Timmy Forbes, MD as Consulting Physician (Oncology)  Chief Complaint  Patient presents with   Follow-up    Shortness of breath on exertion and wheezing. No cough.    BACKGROUND/INTERVAL: Tina Page is a very complex 68 year old female, former smoker, quit in 2017 (63-pack-year history). 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 coli, impetigo, autoimmune hemolytic anemia, B12 deficiency, elevated liver function testing, hyperlipidemia, pulmonary nodules and undifferentiated connective tissue disease.  She presents today for scheduled follow-up.  Last seen on 26 May 2023 by me.  She was last seen on 02 September 2023 by Canary Ceo, NP.  HPI Discussed the use of AI scribe software for clinical note transcription with the patient, who gave verbal consent to proceed.  History of Present Illness   Tina Page is a 68 year old female with COPD and undifferentiated connective tissue disease who presents for follow-up of her respiratory symptoms and management of her COPD.  Her shortness of breath is manageable as long as she avoids walking long distances while on her current medications, Breztri  and Ohtuvayre . She has not participated in pulmonary rehabilitation or an exercise program previously.  Regarding her connective tissue disease, she is currently receiving monthly IVIG infusions and is no longer on rituximab . Her blood count remains at 9.2,  occasionally reaching 10, which she notes is lower than the typical range of 11-12.  She is followed by rheumatology.  She is uncertain about her need for a refill of her acid reflux medication, as she has not yet seen her primary care provider, with an appointment scheduled for June 4th.  Previously gastroesophageal reflux symptoms worsen her respiratory status.  She has not had any fevers, chills or sweats.  She does not endorse any other symptomatology.  She does follow with hematology due to autoimmune hemolytic anemia.    DATA PFTS: 11/18/2017 PFTs: FEV1 1.22 L or 51% predicted, FVC 1.88 L or 63% predicted, FEV1/FVC 65%, no bronchodilator response.  Lung volumes normal, diffusion capacity moderate to severely impaired. 07/07/2022 PFTs: FEV1 0.86 L or 36% predicted, FVC 1.34 L or 43% predicted, FEV1/FVC 64%, there is significant bronchodilator response with both response in FEV1 and FVC improvement.  No hyperinflation, air trapping evident. Moderate to severe diffusion capacity impairment.  There is modest decline in lung function when compared to prior. Consistent with asthma/COPD overlap. 05/25/2023 PFTs: FEV1 0.84 L or 35% predicted, FVC 1.29 L or 42% predicted, FEV1/FVC 65%, there is significant postbronchodilator response.  Lung volumes show air trapping.  Diffusion capacity severely impaired.  IMAGING: 02/16/18 CT chest wo contrast- showed almost complete resolution of spiculated nodules RML. Stable 5mm right parahilar nodules since March 2019 but not present in 2017.  06/27/19 CT chest wo contrast-  previously described nodule of the right middle lobe has almost completely resolved, with residual linear scarring. There is persistent paramedian consolidation of the medial right middle lobe and lingula generally in keeping with atypical infection, particularly atypical mycobacterium. Interval increase in size of a small nodule of the right middle  lobe, now measuring 4 mm, previously 2 mm  Unchanged tiny biapical pulmonary nodules, benign sequelae of nonspecific infection or inflammation, possibly related cigarette smoking, atypical infection, or other inhalational, inflammatory lung disease. 12/27/19 CT Chest wo contrast- stable sub-centimeter bilateral pulmonary nodules consistent with benign etiology. Stable paramedian consolidation RML and lingula. New airspace disease inferior RML with a few adjacent subcenterimeter nodules. Overall, consistent with waxing and waning atypical infectious process such as MAI 07/24/2020 chest LDCT: Lung RADS 2, benign appearance or behavior.  Moderate centrilobular emphysema, multiple nonsolid and solid tiny nodules largest of which was 4.2 mm 12/02/2021 LDCT chest: New right middle lobe pulmonary nodule with aggressive appearance categorized as lung RADS 4 BS, suspicious.  Mild diffuse bronchial wall thickening and mild centrilobular and paraseptal emphysema imaging findings suggestive of underlying COPD 12/22/2021 PET/CT: The previously noted right middle pulmonary nodule appears to be resolving, no FDG avidity noted.  Consistent with infectious/inflammatory etiology.  Recommend continued radiographic follow-up to resolution.  Hypermetabolic activity in the intercostal muscles and crura of the diaphragm reflecting physiologic activity related to ventilation 03/05/2022 LDCT chest: Lung RADS 3, probably benign findings, new 5.9 mm right lower lobe nodule, short-term follow-up 6 months recommended.  Continued decrease in the size of the right middle lobe nodule. 09/07/2022 LDCT chest: Lung RADS 4A, suspicious, small solid anterior right lower lobe pulmonary nodule measuring 5.2 mm below PET/CT resolution, recommend follow-up CT 3 months. 10/23/2022 echocardiogram: LVEF 60 to 65% mild LVH, no wall motion abnormalities, grade 1 DD, right ventricular systolic function, no pulmonary artery hypertension.  No valvular abnormalities. 12/07/2022 LDCT chest: Lung RADS  4A, suspicious, minimally progressive irregular nodule in the right lower lobe measuring 6.1 mm still below PET/CT resolution, suggest additional follow-up in 3 months. 03/09/2023 LDCT chest: Lung RADS 4A, suspicious, 7.2 mm right lower lobe nodule remains mildly progressive however still below the threshold for PET sensitivity.  Additional repeat low-dose CT in 3 to 6 months. 06/01/2023 LDCT chest: Stable pulmonary nodules.  Scattered pulmonary parenchymal scarring.  Lung RADS 2 benign appearance of behavior, continue screening low-dose chest CT without contrast in 12 months.  Emphysema.  Review of Systems A 10 point review of systems was performed and it is as noted above otherwise negative.   Patient Active Problem List   Diagnosis Date Noted   Anemia in chronic kidney disease (CKD) 03/30/2022   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   Preseptal cellulitis of right eye 08/29/2021   Leukopenia 11/22/2020   Herpes zoster without complication    Left leg pain 11/17/2020   Diarrhea 09/01/2020   Clostridium difficile diarrhea 08/26/2020   Lupus 08/11/2020   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HCC) 08/11/2020   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) 08/11/2020   Papular rash 08/05/2020   Bacteremia due to Escherichia coli 07/17/2020   UTI (urinary tract infection) 07/16/2020   Hypothyroidism    Fever    Encounter for antineoplastic immunotherapy 07/15/2020   Goals of care, counseling/discussion 06/19/2020   Abnormal CT of the chest 06/04/2020   Oral candidiasis 06/04/2020   Former smoker 06/04/2020   Pulmonary nodules 12/28/2019   CKD (chronic kidney disease) stage 3, GFR 30-59 ml/min (HCC) 05/15/2019   Postop check 04/24/2019   Paronychia of great toe of right foot 04/17/2019   Ingrowing nail 04/17/2019   Medication monitoring encounter 03/25/2018   Intestinal infection due to enteropathogenic E. coli 03/04/2018   Hepatitis C antibody test  positive 03/04/2018  Enteritis, enteropathogenic E. 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   Elevated liver function tests 11/03/2017   Elevated uric acid in blood 11/03/2017   Low serum cortisol level 01/04/2017   Elevated alkaline phosphatase level 10/25/2016   Oral herpes simplex infection 10/25/2016   Current chronic use of systemic steroids 10/23/2016   Impetigo 10/23/2016   Thrombocytopenia (HCC) 10/04/2016   B12 deficiency 08/06/2016   Symptomatic anemia 07/21/2016   Atherosclerosis of native arteries of extremity with intermittent claudication (HCC) 07/20/2016   PAD (peripheral artery disease) (HCC) 07/12/2016   Post PTCA 07/12/2016   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   Autoimmune hemolytic anemia, 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/12/2016   Hepatitis B core antibody positive 07/12/2016   Tobacco abuse 07/12/2016   Leg pain 07/07/2016   Essential hypertension 07/07/2016   HLD (hyperlipidemia) 07/07/2016   COPD (chronic obstructive pulmonary disease) (HCC) 07/07/2016   Acute kidney injury superimposed on CKD (HCC) 07/07/2016   Heme positive stool 01/29/2015    Social History   Tobacco Use   Smoking status: Former    Current packs/day: 0.00    Average packs/day: 1.3 packs/day for 47.0 years (58.8 ttl pk-yrs)    Types: Cigarettes    Start date: 07/11/1969    Quit date: 07/11/2016    Years since quitting: 7.5   Smokeless tobacco: Never  Substance Use Topics   Alcohol use: No    Allergies  Allergen Reactions   Ciprofloxacin  Swelling    Facial swelling following single oral dose.    Codeine Anaphylaxis   Nsaids Other (See Comments)    Patient has Hep C.     Current Meds  Medication Sig   albuterol  (VENTOLIN  HFA) 108 (90 Base) MCG/ACT inhaler SMARTSIG:2 Puff(s) By Mouth Every 4  Hours PRN   allopurinol  (ZYLOPRIM ) 100 MG tablet TAKE 1 TABLET BY MOUTH EVERY DAY   Budeson-Glycopyrrol-Formoterol (BREZTRI  AEROSPHERE) 160-9-4.8 MCG/ACT AERO Inhale 2 puffs into the lungs in the morning and at bedtime.   CALCIUM  PO Take by mouth daily at 12 noon.   clopidogrel  (PLAVIX ) 75 MG tablet Take 1 tablet (75 mg total) by mouth daily.   cyanocobalamin  500 MCG tablet Take 500 mcg by mouth daily.   Ensifentrine  (OHTUVAYRE ) 3 MG/2.5ML SUSP Take 3 mg by nebulization 2 (two) times daily.   entecavir  (BARACLUDE ) 0.5 MG tablet Take 0.5 mg by mouth every other day.   hydroxychloroquine  (PLAQUENIL ) 200 MG tablet Take 200 mg by mouth 2 (two) times daily.   levothyroxine  (SYNTHROID , LEVOTHROID) 75 MCG tablet Take 75 mcg by mouth daily before breakfast.    lisinopril -hydrochlorothiazide  (ZESTORETIC ) 20-25 MG tablet Take 1 tablet by mouth once daily   rosuvastatin  (CRESTOR ) 40 MG tablet Take 1 tablet (40 mg total) by mouth daily.   VITAMIN D PO Take by mouth daily in the afternoon.   [DISCONTINUED] pantoprazole  (PROTONIX ) 40 MG tablet Take 1 tablet by mouth once daily    Immunization History  Administered Date(s) Administered   Fluad Trivalent(High Dose 65+) 05/13/2023   HIB (PRP-T) 04/14/2018   Influenza Split 05/29/2011, 05/25/2013   Influenza,inj,Quad PF,6+ Mos 05/17/2018, 07/02/2020, 05/20/2022   Influenza,inj,quad, With Preservative 05/17/2014   Influenza-Unspecified 05/17/2018, 05/16/2019   Meningococcal B, OMV 07/06/2019   Meningococcal Mcv4o 07/06/2019, 07/02/2020   Moderna Sars-Covid-2 Vaccination 11/14/2019, 12/08/2019   Pfizer(Comirnaty)Fall Seasonal Vaccine 12  years and older 07/07/2023   Pneumococcal Conjugate-13 04/14/2018   Pneumococcal Polysaccharide-23 07/22/2016   Tdap 05/29/2011, 10/18/2014, 11/18/2020        Objective:     BP 124/60 (BP Location: Left Arm, Patient Position: Sitting, Cuff Size: Normal)   Pulse 95   Temp 97.6 F (36.4 C) (Temporal)   Ht 5\' 3"   (1.6 m)   Wt 179 lb 3.2 oz (81.3 kg)   SpO2 96%   BMI 31.74 kg/m   SpO2: 96 %  GENERAL: Overweight woman, no acute distress, fully ambulatory.  No conversational dyspnea. HEAD: Normocephalic, atraumatic.  EYES: Pupils equal, round, reactive to light.  No scleral icterus.  MOUTH: Poor dentition, oral mucosa moist.  No thrush. NECK: Supple. No thyromegaly. Trachea midline. No JVD.  No adenopathy. PULMONARY: Good air entry bilaterally.  No adventitious sounds noted. CARDIOVASCULAR: S1 and S2. Regular rate and rhythm.  No rubs, murmurs or gallops heard. ABDOMEN: Obese, otherwise benign. MUSCULOSKELETAL: No joint deformity, no clubbing, no edema.  NEUROLOGIC: No overt focal deficit, no gait disturbance, speech is fluent. SKIN: Intact,warm,dry.  Multiple petechiae noted.   PSYCH: Mood and behavior normal.    Assessment & Plan:     ICD-10-CM   1. Stage 3 severe COPD by GOLD classification (HCC)  J44.9 AMB referral to pulmonary rehabilitation    2. Pulmonary nodules  R91.8     3. Undifferentiated connective tissue disease (HCC)  M35.9     4. Autoimmune hemolytic anemia (HCC)  D59.10       Orders Placed This Encounter  Procedures   AMB referral to pulmonary rehabilitation    Referral Priority:   Routine    Referral Type:   Consultation    Referral Reason:   Specialty Services Required    Number of Visits Requested:   1   Discussion:    Chronic Obstructive Pulmonary Disease (COPD) COPD with exertional dyspnea, improved with new nebulizer medication (Ohtuvayre ). Symptoms are well-managed with current medication regimen, but exertional dyspnea persists, limiting physical activity. Pulmonary rehabilitation is recommended to enhance stamina and exercise tolerance, aiding in walking longer distances. - Order pulmonary rehabilitation to improve stamina and exercise tolerance. - Continue current medications Breztri  and Otevier.  Anemia in chronic disease Chronic anemia with  hemoglobin levels around 9.2 g/dL, occasionally reaching 10 g/dL, likely related to underlying systemic lupus erythematosus and treatment regimen.  Undifferentiated connective tissue disease/autoimmune hemolytic anemia Connective tissue disease managed with monthly IVIG infusions. Rituximab  has been discontinued. She reports being well-managed with current treatment, though recurrent infections occur due to immunosuppression. - Continue monthly IVIG infusions.      Advised if symptoms do not improve or worsen, to please contact office for sooner follow up or seek emergency care.    I spent 32 minutes of dedicated to the care of this patient on the date of this encounter to include pre-visit review of records, face-to-face time with the patient discussing conditions above, post visit ordering of testing, clinical documentation with the electronic health record, making appropriate referrals as documented, and communicating necessary findings to members of the patients care team.     C. Chloe Counter, MD Advanced Bronchoscopy PCC[facility]    *This note was generated using voice recognition software/Dragon and/or AI transcription program.  Despite best efforts to proofread, errors can occur which can change the meaning. Any transcriptional errors that result from this process are unintentional and may not be fully corrected at the time of dictation.

## 2024-01-07 NOTE — Patient Instructions (Addendum)
 VISIT SUMMARY:  Today, you had a follow-up appointment to discuss your respiratory symptoms related to COPD and the management of your connective tissue disorder. We reviewed your current medications and discussed additional steps to help manage your conditions.  YOUR PLAN:  -CHRONIC OBSTRUCTIVE PULMONARY DISEASE (COPD): COPD is a chronic lung condition that makes it hard to breathe. Your symptoms are currently well-managed with your medications, Breztri  and Ohtuvayre , but you still experience shortness of breath when walking long distances. To help improve your stamina and exercise tolerance, we recommend starting pulmonary rehabilitation.  -ANEMIA IN CHRONIC DISEASE: Anemia is a condition where you don't have enough healthy red blood cells to carry adequate oxygen to your body's tissues. Your anemia is likely related to your lupus and its treatment. Your hemoglobin levels are around 9.2 g/dL, which is lower than the typical range.  Continue follow-up with hematology.  - UNDIFFERENTIATED CONNECTIVE TISSUE DISEASE: Is an autoimmune disease where your immune system attacks your own tissues. You are currently managing it with monthly IVIG infusions, and you are no longer on rituximab . You are doing well with this treatment, although you experience recurrent infections due to immunosuppression.  Continue to follow with rheumatology in this regard.  INSTRUCTIONS:  1. Start pulmonary rehabilitation to improve your stamina and exercise tolerance. 2. Continue your current medications for COPD: Breztri  and Ohtuvayre . 3. Continue your monthly IVIG infusions for connective tissue disease management. 4. Follow up with your primary care provider on June 4th to discuss your acid reflux medication refill. 5.  Follow-up in 4 months time call sooner should any new problems arise.

## 2024-01-15 ENCOUNTER — Other Ambulatory Visit: Payer: Self-pay | Admitting: Pulmonary Disease

## 2024-01-15 DIAGNOSIS — K219 Gastro-esophageal reflux disease without esophagitis: Secondary | ICD-10-CM

## 2024-01-18 ENCOUNTER — Encounter (INDEPENDENT_AMBULATORY_CARE_PROVIDER_SITE_OTHER): Payer: Self-pay

## 2024-01-28 ENCOUNTER — Encounter: Payer: Self-pay | Admitting: Pulmonary Disease

## 2024-02-03 ENCOUNTER — Encounter: Payer: Self-pay | Admitting: Family Medicine

## 2024-02-10 ENCOUNTER — Other Ambulatory Visit: Payer: Self-pay

## 2024-02-10 ENCOUNTER — Encounter: Payer: Self-pay | Admitting: Oncology

## 2024-02-10 ENCOUNTER — Inpatient Hospital Stay: Attending: Oncology

## 2024-02-10 ENCOUNTER — Ambulatory Visit: Payer: Self-pay | Admitting: Oncology

## 2024-02-10 ENCOUNTER — Inpatient Hospital Stay

## 2024-02-10 ENCOUNTER — Inpatient Hospital Stay: Admitting: Oncology

## 2024-02-10 VITALS — BP 127/55 | HR 81 | Temp 97.4°F | Resp 15 | Wt 179.0 lb

## 2024-02-10 DIAGNOSIS — N1832 Chronic kidney disease, stage 3b: Secondary | ICD-10-CM | POA: Diagnosis not present

## 2024-02-10 DIAGNOSIS — D631 Anemia in chronic kidney disease: Secondary | ICD-10-CM | POA: Insufficient documentation

## 2024-02-10 DIAGNOSIS — R768 Other specified abnormal immunological findings in serum: Secondary | ICD-10-CM | POA: Diagnosis not present

## 2024-02-10 DIAGNOSIS — Z87891 Personal history of nicotine dependence: Secondary | ICD-10-CM | POA: Insufficient documentation

## 2024-02-10 DIAGNOSIS — N1831 Chronic kidney disease, stage 3a: Secondary | ICD-10-CM | POA: Insufficient documentation

## 2024-02-10 DIAGNOSIS — D591 Autoimmune hemolytic anemia, unspecified: Secondary | ICD-10-CM

## 2024-02-10 DIAGNOSIS — D5911 Warm autoimmune hemolytic anemia: Secondary | ICD-10-CM | POA: Insufficient documentation

## 2024-02-10 LAB — RETIC PANEL
Immature Retic Fract: 17.9 % — ABNORMAL HIGH (ref 2.3–15.9)
RBC.: 2.7 MIL/uL — ABNORMAL LOW (ref 3.87–5.11)
Retic Count, Absolute: 134.7 10*3/uL (ref 19.0–186.0)
Retic Ct Pct: 5 % — ABNORMAL HIGH (ref 0.4–3.1)
Reticulocyte Hemoglobin: 31.5 pg (ref >27.9)

## 2024-02-10 LAB — CMP (CANCER CENTER ONLY)
ALT: 9 U/L (ref 0–44)
AST: 18 U/L (ref 15–41)
Albumin: 4.1 g/dL (ref 3.5–5.0)
Alkaline Phosphatase: 65 U/L (ref 38–126)
Anion gap: 10 (ref 5–15)
BUN: 21 mg/dL (ref 8–23)
CO2: 27 mmol/L (ref 22–32)
Calcium: 9.5 mg/dL (ref 8.9–10.3)
Chloride: 106 mmol/L (ref 98–111)
Creatinine: 1.54 mg/dL — ABNORMAL HIGH (ref 0.44–1.00)
GFR, Estimated: 37 mL/min — ABNORMAL LOW (ref >60)
Glucose, Bld: 101 mg/dL — ABNORMAL HIGH (ref 70–99)
Potassium: 4.2 mmol/L (ref 3.5–5.1)
Sodium: 143 mmol/L (ref 135–145)
Total Bilirubin: 0.9 mg/dL (ref 0.0–1.2)
Total Protein: 6.8 g/dL (ref 6.5–8.1)

## 2024-02-10 LAB — CBC WITH DIFFERENTIAL (CANCER CENTER ONLY)
Abs Immature Granulocytes: 0.02 10*3/uL (ref 0.00–0.07)
Basophils Absolute: 0.1 10*3/uL (ref 0.0–0.1)
Basophils Relative: 1 %
Eosinophils Absolute: 0.1 10*3/uL (ref 0.0–0.5)
Eosinophils Relative: 2 %
HCT: 29 % — ABNORMAL LOW (ref 36.0–46.0)
Hemoglobin: 9.3 g/dL — ABNORMAL LOW (ref 12.0–15.0)
Immature Granulocytes: 0 %
Lymphocytes Relative: 18 %
Lymphs Abs: 0.9 10*3/uL (ref 0.7–4.0)
MCH: 34.6 pg — ABNORMAL HIGH (ref 26.0–34.0)
MCHC: 32.1 g/dL (ref 30.0–36.0)
MCV: 107.8 fL — ABNORMAL HIGH (ref 80.0–100.0)
Monocytes Absolute: 0.4 10*3/uL (ref 0.1–1.0)
Monocytes Relative: 8 %
Neutro Abs: 3.4 10*3/uL (ref 1.7–7.7)
Neutrophils Relative %: 71 %
Platelet Count: 154 10*3/uL (ref 150–400)
RBC: 2.69 MIL/uL — ABNORMAL LOW (ref 3.87–5.11)
RDW: 14.6 % (ref 11.5–15.5)
WBC Count: 4.8 10*3/uL (ref 4.0–10.5)
nRBC: 0 % (ref 0.0–0.2)

## 2024-02-10 LAB — IRON AND TIBC
Iron: 62 ug/dL (ref 28–170)
Saturation Ratios: 20 % (ref 10.4–31.8)
TIBC: 316 ug/dL (ref 250–450)
UIBC: 254 ug/dL

## 2024-02-10 LAB — FERRITIN: Ferritin: 77 ng/mL (ref 11–307)

## 2024-02-10 MED ORDER — EPOETIN ALFA 20000 UNIT/ML IJ SOLN
20000.0000 [IU] | Freq: Once | INTRAMUSCULAR | Status: AC
  Administered 2024-02-10: 20000 [IU] via INTRAVENOUS
  Filled 2024-02-10: qty 1

## 2024-02-10 NOTE — Assessment & Plan Note (Signed)
 History of hepatitis B previously on entecavir  after her previous rituximab  treatments.  Recommend patient to continue Entecavir  0.5mg , till finish 1 year course.  Per ID recommendation  for GFR >=30, take every other day For GFR <30, take every 72 hours.

## 2024-02-10 NOTE — Assessment & Plan Note (Signed)
 LDH is normal, adequate B12 and folate level, normal iron panel.  There is increased reticulocyte %-5%.  Cold agglutinin titer negative. Bilirubin is normal. 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  12/22/21 Negative PET scan  05/25/22 Bone marrow biopsy did not reveal any lymphoproliferative diseases. Mild hypocellular marrow, erythroid hyperplasia, mild evidence of spherocytosis, consistent with hemolysis.  S/p Rituximab weekly x 4  Labs are reviewed and discussed with patient. Hemoglobin is stable.

## 2024-02-10 NOTE — Progress Notes (Signed)
 Hematology/Oncology Progress note [phone] [phone]      Clinic Day:  02/10/2024  ASSESSMENT & PLAN:   Autoimmune hemolytic anemia (HCC) LDH is normal, adequate B12 and folate level, normal iron panel.  There is increased reticulocyte %-5%.  Cold agglutinin titer negative. Bilirubin is normal. DAT is positive for both IgG and complement-this is a chronic finding for her.Negative peripheral flowcytometry, haptoglobin <10, no M protein, normal C3, C4, normal copper  level.  12/22/21 Negative PET scan  05/25/22 Bone marrow biopsy did not reveal any lymphoproliferative diseases. Mild hypocellular marrow, erythroid hyperplasia, mild evidence of spherocytosis, consistent with hemolysis.  S/p Rituximab  weekly x 4  Labs are reviewed and discussed with patient. Hemoglobin is stable.   Anemia in chronic kidney disease (CKD) Lab Results  Component Value Date   HGB 9.3 (L) 02/10/2024   TIBC 316 02/10/2024   IRONPCTSAT 20 02/10/2024   FERRITIN 77 02/10/2024    Recommend Epogen  20,000 units monthly if Hb <11 Ferritin is less than 200, recommend patient to take Vitron C 1 tab daily.   CKD (chronic kidney disease) stage 3, GFR 30-59 ml/min (HCC) Encourage oral hydration.  Avoid nephrotoxins.    Hepatitis B core antibody positive History of hepatitis B previously on entecavir  after her previous rituximab  treatments.  Recommend patient to continue Entecavir  0.5mg , till finish 1 year course.  Per ID recommendation  for GFR >=30, take every other day For GFR <30, take every 72 hours.     Orders Placed This Encounter  Procedures   Ferritin    Standing Status:   Future    Number of Occurrences:   1    Expected Date:   02/10/2024    Expiration Date:   05/10/2024   Retic Panel    Standing Status:   Future    Number of Occurrences:   1    Expected Date:   02/10/2024    Expiration Date:   05/10/2024   Iron and TIBC    Standing Status:   Future    Number of  Occurrences:   1    Expected Date:   02/10/2024    Expiration Date:   05/10/2024   CMP (Cancer Center only)    Standing Status:   Future    Expected Date:   05/12/2024    Expiration Date:   08/10/2024   CBC with Differential (Cancer Center Only)    Standing Status:   Future    Expected Date:   05/12/2024    Expiration Date:   08/10/2024   CBC with Differential (Cancer Center Only)    Standing Status:   Future    Expected Date:   04/06/2024    Expiration Date:   07/05/2024   CMP (Cancer Center only)    Standing Status:   Future    Expected Date:   04/06/2024    Expiration Date:   07/05/2024   CBC with Differential (Cancer Center Only)    Standing Status:   Future    Expected Date:   03/09/2024    Expiration Date:   06/07/2024   Basic Metabolic Panel - Cancer Center Only    Standing Status:   Future    Expected Date:   03/09/2024    Expiration Date:   06/07/2024    Follow up  lab in 4 weeks + Epogen  -cbc bmp 8 weeks, lab + Epogen  - cbc bmp 3 months ab MD + Epogen  - cbc cmp   All questions were answered.  The patient knows to call the clinic with any problems, questions or concerns.  Timmy Forbes, MD, PhD [facility] Hematology Oncology 02/10/2024   Chief Complaint: Tina Page is a 68 y.o. 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  Reticulocyte count was 11.3%.  Ferritin was 562.  Iron studies revealed a saturation of 20% and a TIBC of 214 (low).  B12 was 231 (low normal).  Folate was 42.   Peripheral smear revealed rouleaux formation.    Additional testing included the following + studies:  hepatitis C antibody, hepatitis B core antibody, CMV IgM, and EBV  VCA (IgM and IgG).  Hepatitis B by PCR was negative.  Hepatitis C RNA was negative.  Mycoplasma pneumonia IgM was negative.  Reticulocyte count was 11.3% (high) indicating appropriate marrow response.  C3 and C4 were normal.  Cold agglutinin titer was negative x 2.  Negative studies included:  ANA, hepatitis B surface antigen, SPEP, and free light chain ratio.   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  MMA was initially 330 (normal).  Repeat MMA was elevated on 08/01/2016 confirming B12 deficiency.  Hepatitis B surface antibody was positive and hepatitis B surface antigen was negative on 08/06/2016.   07/07/2016  Chest, abdomen, and pelvis CT angiogram on revealed moderate diffuse atherosclerotic vascular disease of the abdominal aorta with severe stenosis of the left common iliac artery with suspected short segment occlusion. There was no adenopathy.  Spleen was normal.   04/15/2017 Abd US  revealed a normal spleen and sludge in the gallbladder.  The liver was echogenic consistent fatty infiltration and/or hepatocellular disease.  For hemolytic anemia treatments, in 2017, She has received multiple units of  warmed PRBCs to date.  08/01/2016-03/12/2017, steroid treatment with prednisone .  She is also on folic acid  1 mg daily. Borderline low vitamin B12 level on 07/09/2016.  Patient has been on oral vitamin B12 supplementation.  10/12/2017  positive warm autoantibody.  LDH and bilirubin were normal.   She began prednisone  10 mg on 12/07/2017 (discontinued 03/2018).  She received Rituxan  weekly x 4 (last dose 06/16/2018).  Entecavir  was discontinued on 07/06/2019.  06/12/2020 developed recurrent anemia.  Hematocrit was 27.6, hemoglobin 8.4, MCV 104.5, platelets 138,000, WBC 3,400 (ANC 2,300). Creatinine was 1.17 (CrCl 50 ml/min).  Positive warm autoantibody  Reticulocyte count was 7.1%.  LDH was 204 (98-192).  06/16/2020, began prednisone  1 mg/kilogram treatments, 07/24/2020,  prednisone  was tapered down to 20 mg.  She received Rituxan  1000 mg on day 1 and 15 (07/15/2020 and 07/29/2020).  She began entecavir  on 06/28/2020.  Prednisone  was tapered off.  She was on Septra  for PCP prophylaxis which is now currently on hold    Other medical problems 07/10/2016.  She underwent PTCA and stent placement in right and left common iliac arteries and left external iliac artery. She is on Plavix .   03/19/2015 EGD revealed gastritis in the body and antrum.  Colonoscopy revealed one hyperplastic polyp.  Repeat EGD on 07/08/2016 was normal.  No evidence of bleeding.  History of she developed peri-oral and intranasal herpes simplex-1.  She was treated with valacyclovir  and doxycycline  on 10/19/2016.   10/26/2017. She developed flu like symptoms.  Symptoms  included cough, myalgias, and fever (tmax unknown).  She was prescribed Mucinex, Tamiflu, and amoxicillin .  She only took amoxicillin  x 5 days.  She developed increased liver function tests on 11/02/2017. CMV IgG was positive.  EBV VCA IgG, NA IgG, early antigen antibody IgG were elevated.  EBV VCA IgM was < 36.  Testing was c/w a convalescence/past infection or reactivated infection.  LFTs normalized on 11/15/2017.  Smooth muscle antibody was 39 (high) on 11/10/2017 and 34 (high) on 11/30/2017.     Chest CT on 11/16/2017 revealed a 2.2 x 2.0 x 2.4 spiculated RIGHT middle lobe mass.  There was tiny nonspecific upper lobe pulmonary nodules greater on RIGHT, largest 3 mm, of uncertain etiology.  There was additional 8 x 8 mm opacity in the posterior sulcus of the RIGHT lower lobe. PET scan on 11/22/2017 revealed a 2 cm hypermetabolic right middle lobe lung mass (SUV 3.4) consistent  with primary lung neoplasm.  There were no findings for mediastinal/hilar lymphadenopathy or metastatic disease.  There were areas of hypermetabolism involving the left oblique abdominal muscles and the anorectal junction. PFTs on 11/18/2017 revealed an FEV1 of  1.22 liters (51%).  DLCO adj was 6.8 mL/mmHg/min (32%).   ENB done on 12/07/2017. Cytology was negative for malignancy. Pathology demonstrated organizing pneumonia and chronic bronchitis. Pathologist commented that organizing pneumonia spanned about 2 mm in one fragment. Cases of focal organizing pneumonia with hypermetabolism on FDG-PET have been described, but changes of this type can also be adjacent to a neoplasm. Patient prescribed a daily dose of Prednisone  10 mg   07/24/2020  repeat  chest CT on  revealed Lung-RADS 2, benign appearance or behavior. There was emphysema, aortic atherosclerosis, and coronary artery calcifications.  diarrhea.  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  She received azithromycin  x 3 days.  She received ciprofloxacin  x 10 days without improvement.  She has seen ID in [place].  She began Bactrim  on 03/04/2018 (discontinued on 03/11/2018) secondary to increase in creatine.   Chronic kidney disease Urinalysis on 11/15/2017 revealed revealed no hemoglobin, bilirubin or active sediment.   Liver function tests increased on 02/15/2019.  Work-up on 02/16/2019 revealed hepatitis B E antibody positive.  Negative studies included: hepatitis A total antibody, hepatitis A IgM, hepatitis B surface antigen, hepatitis B E antigen.  Hepatitis B DNA was not detected.  Folate was 80.5 and vitamin B12 was 443.  She has received her vaccinations:  She has completed the PCV13, PPSV23, and HiB.  She received Menvio (quadrivalent meningoccal vaccine) and Bexero (univalent serogroup B meningoccal vaccine) on 07/06/2019.  She was diagnosed with C difficile + diarrhea on 08/12/2020.  She completed a course of oral vancomycin .  Stool was negative for C. difficile on 08/27/2020.  11/17/2020 - 11/20/2020 [facility] admission with left L5 varicella zoster.  Lesion was positive for VZV. She was treated with ceftazidime  and a valacyclovir .  She was discharged on 9 days of  Valtrex  1 gm 3 times daily followed by 500 mg daily indefinitely for suppression and doxycycline .  History of discoid lupus  #Positive hepatitis B core antibody Previously on entecavir  0.5 mg daily, currently she has finished a course. Dose adjusted based on renal function. CrCl 30-49 ml/min   50% dosing (0.5 mg QOD) CrCl >= 50 ml/min 100% dosing (0.5 mg a day). With her kidney function currently QOD She has completed course of Entecavir  and stopped 08/14/2021  # PCP prophylaxis Patient was on Septra  DS on Mondays,  Wednesdays, and Fridays, which was held due to fluctuated kidney functions.  Currently off Septra .  12/03/2021, CT chest lung cancer screening showed new right middle lobe pulmonary nodule with an aggressive appearance characterized as lung RADS 4 VS.  12/22/2021 PET showed No abnormal FDG avidity associated with the resolving right middle lobe pulmonary nodule now with a 12 mm ground-glass opacity in the area of prior solid nodularity, most consistent with an infectious or inflammatory etiology  INTERVAL HISTORY Tina Page is a 68 y.o. female who has above history reviewed by me today presents for follow up visit  Chronic fatigue unchanged.  Recent flu infection  She tolerates retacrit  injections.  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/23/2017   Post PTCA 07/12/2016   iliac stents bilaterally 2017   Thrombocytopenia (HCC) 10/04/2016   Wears dentures    full upper and lower     Past Surgical History:  Procedure Laterality Date   ELECTROMAGNETIC NAVIGATION BROCHOSCOPY N/A 12/07/2017   Procedure: ELECTROMAGNETIC NAVIGATION BRONCHOSCOPY;  Surgeon: Cleve Dale, MD;  Location: [facility] ORS;  Service: Cardiopulmonary;  Laterality: N/A;   ESOPHAGOGASTRODUODENOSCOPY (EGD) WITH PROPOFOL  N/A 07/08/2016   Procedure: ESOPHAGOGASTRODUODENOSCOPY (EGD) WITH PROPOFOL ;  Surgeon: Cassie Click, MD;  Location: [facility] ENDOSCOPY;  Service: Endoscopy;  Laterality: N/A;   KNEE SURGERY Right    repair of acl tear   PERIPHERAL VASCULAR CATHETERIZATION N/A 07/10/2016   Procedure: Lower Extremity Angiography;  Surgeon: Jackquelyn Mass, MD;  Location: [facility] INVASIVE CV LAB;  Service: Cardiovascular;  Laterality: N/A;   PERIPHERAL VASCULAR CATHETERIZATION N/A 07/10/2016   Procedure: Abdominal Aortogram w/Lower Extremity;  Surgeon: Jackquelyn Mass, MD;  Location: [facility] INVASIVE CV LAB;  Service: Cardiovascular;  Laterality: N/A;   PERIPHERAL VASCULAR CATHETERIZATION  07/10/2016   Procedure: Lower Extremity Intervention;  Surgeon: Jackquelyn Mass, MD;  Location: [facility] INVASIVE CV LAB;  Service: Cardiovascular;;    Family History  Problem Relation Age of Onset   Diabetes Mother    Hypertension Mother    Diabetes Maternal Grandfather    Hypertension Maternal Grandfather    Breast cancer Neg Hx     Social History:  reports that she quit smoking about 7 years ago. Her smoking use included cigarettes. She started smoking about 54 years ago. She has a 58.8 pack-year smoking history. She has never used smokeless tobacco. She reports that she does not drink alcohol and does not use drugs.  Former smoker, 58-pack-year smoking history.   She has a daughter, Jeanine and a daughter named [name].  She lives in [place]. The patient is alone today.  Allergies:  Allergies  Allergen Reactions   Ciprofloxacin  Swelling    Facial swelling following single oral dose.    Codeine Anaphylaxis   Nsaids  Other (See Comments)    Patient has Hep C.     Current Medications: Current Outpatient Medications  Medication Sig Dispense Refill   albuterol  (VENTOLIN  HFA) 108 (90 Base) MCG/ACT inhaler SMARTSIG:2 Puff(s) By Mouth  Every 4 Hours PRN     allopurinol  (ZYLOPRIM ) 100 MG tablet TAKE 1 TABLET BY MOUTH EVERY DAY 30 tablet 5   Budeson-Glycopyrrol-Formoterol (BREZTRI  AEROSPHERE) 160-9-4.8 MCG/ACT AERO Inhale 2 puffs into the lungs in the morning and at bedtime. 3 each    CALCIUM  PO Take by mouth daily at 12 noon.     clopidogrel  (PLAVIX ) 75 MG tablet Take 1 tablet (75 mg total) by mouth daily. 30 tablet 5   cyanocobalamin  500 MCG tablet Take 500 mcg by mouth daily.     Ensifentrine  (OHTUVAYRE ) 3 MG/2.5ML SUSP Take 3 mg by nebulization 2 (two) times daily.     entecavir  (BARACLUDE ) 0.5 MG tablet Take 0.5 mg by mouth every other day.     hydroxychloroquine  (PLAQUENIL ) 200 MG tablet Take 200 mg by mouth 2 (two) times daily.     levothyroxine  (SYNTHROID , LEVOTHROID) 75 MCG tablet Take 75 mcg by mouth daily before breakfast.      lisinopril -hydrochlorothiazide  (ZESTORETIC ) 20-25 MG tablet Take 1 tablet by mouth once daily 90 tablet 3   pantoprazole  (PROTONIX ) 40 MG tablet Take 1 tablet by mouth once daily 30 tablet 0   rosuvastatin  (CRESTOR ) 40 MG tablet Take 1 tablet (40 mg total) by mouth daily. 90 tablet 3   VITAMIN D PO Take by mouth daily in the afternoon.     No current facility-administered medications for this visit.    Review of Systems  Constitutional:  Negative for appetite change, chills, fatigue and fever.  HENT:   Negative for hearing loss and voice change.   Eyes:  Negative for eye problems.  Respiratory:  Negative for chest tightness and cough.   Cardiovascular:  Negative for chest pain.  Gastrointestinal:  Negative for abdominal distention, abdominal pain and blood in stool.  Endocrine: Negative for hot flashes.  Genitourinary:  Negative for difficulty urinating and frequency.    Musculoskeletal:  Negative for arthralgias.  Skin:  Negative for itching and rash.  Neurological:  Negative for extremity weakness.  Hematological:  Negative for adenopathy.  Psychiatric/Behavioral:  Negative for confusion. The patient is not nervous/anxious.      Performance status (ECOG): 1  Vital Signs Blood pressure (!) 127/55, pulse 81, temperature (!) 97.4 F (36.3 C), temperature source Tympanic, resp. rate 15, weight 179 lb (81.2 kg), SpO2 98%.  Physical Exam Constitutional:      General: She is not in acute distress.    Appearance: She is obese. She is not diaphoretic.  HENT:     Head: Normocephalic and atraumatic.   Eyes:     General: No scleral icterus.   Cardiovascular:     Rate and Rhythm: Normal rate and regular rhythm.     Heart sounds: No murmur heard. Pulmonary:     Effort: Pulmonary effort is normal. No respiratory distress.  Abdominal:     General: There is no distension.     Palpations: Abdomen is soft.     Tenderness: There is no abdominal tenderness.   Musculoskeletal:        General: Normal range of motion.     Cervical back: Normal range of motion and neck supple.   Skin:    General: Skin is warm and dry.     Findings: No erythema.   Neurological:     Mental Status: She is alert and oriented to person, place, and time. Mental status is at baseline.     Motor: No abnormal muscle tone.   Psychiatric:  Mood and Affect: Mood and affect normal.      Laboratory findings     Latest Ref Rng & Units 02/10/2024   10:25 AM 01/05/2024   11:17 AM 12/15/2023   10:42 AM  CBC  WBC 4.0 - 10.5 K/uL 4.8  4.0  4.9   Hemoglobin 12.0 - 15.0 g/dL 9.3  9.2  9.2   Hematocrit 36.0 - 46.0 % 29.0  29.0  28.8   Platelets 150 - 400 K/uL 154  182  180       Latest Ref Rng & Units 02/10/2024   10:25 AM 01/05/2024   11:16 AM 12/15/2023   10:42 AM  CMP  Glucose 70 - 99 mg/dL 409  96  811   BUN 8 - 23 mg/dL 21  26  37   Creatinine 0.44 - 1.00 mg/dL 9.14   7.82  9.56   Sodium 135 - 145 mmol/L 143  136  139   Potassium 3.5 - 5.1 mmol/L 4.2  4.1  4.2   Chloride 98 - 111 mmol/L 106  100  108   CO2 22 - 32 mmol/L 27  26  23    Calcium  8.9 - 10.3 mg/dL 9.5  9.4  9.1   Total Protein 6.5 - 8.1 g/dL 6.8     Total Bilirubin 0.0 - 1.2 mg/dL 0.9     Alkaline Phos 38 - 126 U/L 65     AST 15 - 41 U/L 18     ALT 0 - 44 U/L 9

## 2024-02-10 NOTE — Assessment & Plan Note (Addendum)
 Lab Results  Component Value Date   HGB 9.3 (L) 02/10/2024   TIBC 316 02/10/2024   IRONPCTSAT 20 02/10/2024   FERRITIN 77 02/10/2024    Recommend Epogen  20,000 units monthly if Hb <11 Ferritin is less than 200, recommend patient to take Vitron C 1 tab daily.

## 2024-02-10 NOTE — Assessment & Plan Note (Signed)
Encourage oral hydration. Avoid nephrotoxins. 

## 2024-02-11 NOTE — Telephone Encounter (Signed)
-----   Message from Timmy Forbes sent at 02/10/2024  4:53 PM EDT ----- Please advise patient to start on oral iron supplementation Vitron C once daily.  ----- Message ----- From: Dannis Dy, Lab In [place] Sent: 02/10/2024  11:34 AM EDT To: Timmy Forbes, MD

## 2024-02-16 ENCOUNTER — Other Ambulatory Visit: Payer: Self-pay | Admitting: Pulmonary Disease

## 2024-02-16 DIAGNOSIS — K219 Gastro-esophageal reflux disease without esophagitis: Secondary | ICD-10-CM

## 2024-02-29 ENCOUNTER — Other Ambulatory Visit: Payer: Self-pay | Admitting: Pulmonary Disease

## 2024-02-29 DIAGNOSIS — K219 Gastro-esophageal reflux disease without esophagitis: Secondary | ICD-10-CM

## 2024-03-09 ENCOUNTER — Inpatient Hospital Stay

## 2024-03-09 ENCOUNTER — Inpatient Hospital Stay: Attending: Oncology

## 2024-03-09 VITALS — BP 136/70

## 2024-03-09 DIAGNOSIS — N1831 Chronic kidney disease, stage 3a: Secondary | ICD-10-CM | POA: Insufficient documentation

## 2024-03-09 DIAGNOSIS — D5911 Warm autoimmune hemolytic anemia: Secondary | ICD-10-CM | POA: Diagnosis not present

## 2024-03-09 DIAGNOSIS — Z87891 Personal history of nicotine dependence: Secondary | ICD-10-CM | POA: Insufficient documentation

## 2024-03-09 DIAGNOSIS — D631 Anemia in chronic kidney disease: Secondary | ICD-10-CM | POA: Insufficient documentation

## 2024-03-09 DIAGNOSIS — D591 Autoimmune hemolytic anemia, unspecified: Secondary | ICD-10-CM

## 2024-03-09 LAB — CBC WITH DIFFERENTIAL (CANCER CENTER ONLY)
Abs Immature Granulocytes: 0.02 K/uL (ref 0.00–0.07)
Basophils Absolute: 0.1 K/uL (ref 0.0–0.1)
Basophils Relative: 1 %
Eosinophils Absolute: 0.1 K/uL (ref 0.0–0.5)
Eosinophils Relative: 2 %
HCT: 29.9 % — ABNORMAL LOW (ref 36.0–46.0)
Hemoglobin: 9.5 g/dL — ABNORMAL LOW (ref 12.0–15.0)
Immature Granulocytes: 1 %
Lymphocytes Relative: 21 %
Lymphs Abs: 0.9 K/uL (ref 0.7–4.0)
MCH: 33.6 pg (ref 26.0–34.0)
MCHC: 31.8 g/dL (ref 30.0–36.0)
MCV: 105.7 fL — ABNORMAL HIGH (ref 80.0–100.0)
Monocytes Absolute: 0.3 K/uL (ref 0.1–1.0)
Monocytes Relative: 8 %
Neutro Abs: 2.8 K/uL (ref 1.7–7.7)
Neutrophils Relative %: 67 %
Platelet Count: 163 K/uL (ref 150–400)
RBC: 2.83 MIL/uL — ABNORMAL LOW (ref 3.87–5.11)
RDW: 14.4 % (ref 11.5–15.5)
WBC Count: 4.2 K/uL (ref 4.0–10.5)
nRBC: 0 % (ref 0.0–0.2)

## 2024-03-09 LAB — BASIC METABOLIC PANEL - CANCER CENTER ONLY
Anion gap: 9 (ref 5–15)
BUN: 27 mg/dL — ABNORMAL HIGH (ref 8–23)
CO2: 26 mmol/L (ref 22–32)
Calcium: 9.4 mg/dL (ref 8.9–10.3)
Chloride: 104 mmol/L (ref 98–111)
Creatinine: 1.73 mg/dL — ABNORMAL HIGH (ref 0.44–1.00)
GFR, Estimated: 32 mL/min — ABNORMAL LOW (ref >60)
Glucose, Bld: 92 mg/dL (ref 70–99)
Potassium: 4.4 mmol/L (ref 3.5–5.1)
Sodium: 139 mmol/L (ref 135–145)

## 2024-03-09 MED ORDER — EPOETIN ALFA 20000 UNIT/ML IJ SOLN
20000.0000 [IU] | Freq: Once | INTRAMUSCULAR | Status: DC
  Filled 2024-03-09: qty 1

## 2024-03-09 MED ORDER — EPOETIN ALFA 20000 UNIT/ML IJ SOLN
20000.0000 [IU] | Freq: Once | INTRAMUSCULAR | Status: AC
  Administered 2024-03-09: 20000 [IU] via SUBCUTANEOUS
  Filled 2024-03-09: qty 1

## 2024-03-10 ENCOUNTER — Other Ambulatory Visit: Payer: Self-pay | Admitting: Pulmonary Disease

## 2024-03-10 DIAGNOSIS — K219 Gastro-esophageal reflux disease without esophagitis: Secondary | ICD-10-CM

## 2024-03-10 NOTE — Telephone Encounter (Signed)
 Needs to go through primary care or GI.

## 2024-03-15 DIAGNOSIS — B181 Chronic viral hepatitis B without delta-agent: Principal | ICD-10-CM

## 2024-03-16 ENCOUNTER — Other Ambulatory Visit: Payer: Self-pay | Admitting: Pulmonary Disease

## 2024-03-16 DIAGNOSIS — K219 Gastro-esophageal reflux disease without esophagitis: Secondary | ICD-10-CM

## 2024-03-27 ENCOUNTER — Other Ambulatory Visit: Payer: Self-pay

## 2024-03-27 MED ORDER — BREZTRI AEROSPHERE 160-9-4.8 MCG/ACT IN AERO: 2.0000 | INHALATION_SPRAY | Freq: Two times a day (BID) | RESPIRATORY_TRACT | 3 refills | Status: AC

## 2024-03-27 NOTE — Progress Notes (Signed)
 Per fax from [facility]- refill for Breztri  is needed.  Refill has been sent in.  Nothing further needed.

## 2024-04-06 ENCOUNTER — Inpatient Hospital Stay

## 2024-04-06 ENCOUNTER — Inpatient Hospital Stay: Attending: Oncology

## 2024-04-06 VITALS — BP 138/60

## 2024-04-06 DIAGNOSIS — N1831 Chronic kidney disease, stage 3a: Secondary | ICD-10-CM | POA: Insufficient documentation

## 2024-04-06 DIAGNOSIS — D5911 Warm autoimmune hemolytic anemia: Secondary | ICD-10-CM | POA: Diagnosis not present

## 2024-04-06 DIAGNOSIS — D591 Autoimmune hemolytic anemia, unspecified: Secondary | ICD-10-CM

## 2024-04-06 DIAGNOSIS — D631 Anemia in chronic kidney disease: Secondary | ICD-10-CM | POA: Diagnosis present

## 2024-04-06 LAB — CBC WITH DIFFERENTIAL (CANCER CENTER ONLY)
Abs Immature Granulocytes: 0.02 K/uL (ref 0.00–0.07)
Basophils Absolute: 0.1 K/uL (ref 0.0–0.1)
Basophils Relative: 1 %
Eosinophils Absolute: 0.2 K/uL (ref 0.0–0.5)
Eosinophils Relative: 3 %
HCT: 29.1 % — ABNORMAL LOW (ref 36.0–46.0)
Hemoglobin: 9.5 g/dL — ABNORMAL LOW (ref 12.0–15.0)
Immature Granulocytes: 0 %
Lymphocytes Relative: 22 %
Lymphs Abs: 1.2 K/uL (ref 0.7–4.0)
MCH: 34.5 pg — ABNORMAL HIGH (ref 26.0–34.0)
MCHC: 32.6 g/dL (ref 30.0–36.0)
MCV: 105.8 fL — ABNORMAL HIGH (ref 80.0–100.0)
Monocytes Absolute: 0.4 K/uL (ref 0.1–1.0)
Monocytes Relative: 8 %
Neutro Abs: 3.4 K/uL (ref 1.7–7.7)
Neutrophils Relative %: 66 %
Platelet Count: 169 K/uL (ref 150–400)
RBC: 2.75 MIL/uL — ABNORMAL LOW (ref 3.87–5.11)
RDW: 14.6 % (ref 11.5–15.5)
WBC Count: 5.3 K/uL (ref 4.0–10.5)
nRBC: 0 % (ref 0.0–0.2)

## 2024-04-06 LAB — CMP (CANCER CENTER ONLY)
ALT: 9 U/L (ref 0–44)
AST: 18 U/L (ref 15–41)
Albumin: 4 g/dL (ref 3.5–5.0)
Alkaline Phosphatase: 64 U/L (ref 38–126)
Anion gap: 8 (ref 5–15)
BUN: 22 mg/dL (ref 8–23)
CO2: 28 mmol/L (ref 22–32)
Calcium: 9.5 mg/dL (ref 8.9–10.3)
Chloride: 105 mmol/L (ref 98–111)
Creatinine: 1.61 mg/dL — ABNORMAL HIGH (ref 0.44–1.00)
GFR, Estimated: 35 mL/min — ABNORMAL LOW (ref >60)
Glucose, Bld: 92 mg/dL (ref 70–99)
Potassium: 4.1 mmol/L (ref 3.5–5.1)
Sodium: 141 mmol/L (ref 135–145)
Total Bilirubin: 1 mg/dL (ref 0.0–1.2)
Total Protein: 7 g/dL (ref 6.5–8.1)

## 2024-04-06 MED ORDER — EPOETIN ALFA 20000 UNIT/ML IJ SOLN
20000.0000 [IU] | Freq: Once | INTRAMUSCULAR | Status: AC
  Administered 2024-04-06: 20000 [IU] via SUBCUTANEOUS
  Filled 2024-04-06: qty 1

## 2024-05-12 ENCOUNTER — Inpatient Hospital Stay: Attending: Oncology

## 2024-05-12 ENCOUNTER — Encounter: Payer: Self-pay | Admitting: Oncology

## 2024-05-12 ENCOUNTER — Inpatient Hospital Stay (HOSPITAL_BASED_OUTPATIENT_CLINIC_OR_DEPARTMENT_OTHER): Admitting: Oncology

## 2024-05-12 ENCOUNTER — Inpatient Hospital Stay

## 2024-05-12 VITALS — BP 149/60 | HR 86 | Temp 97.8°F | Resp 18 | Wt 180.1 lb

## 2024-05-12 DIAGNOSIS — D591 Autoimmune hemolytic anemia, unspecified: Secondary | ICD-10-CM | POA: Diagnosis not present

## 2024-05-12 DIAGNOSIS — D5911 Warm autoimmune hemolytic anemia: Secondary | ICD-10-CM | POA: Diagnosis not present

## 2024-05-12 DIAGNOSIS — N1832 Chronic kidney disease, stage 3b: Secondary | ICD-10-CM

## 2024-05-12 DIAGNOSIS — N1831 Chronic kidney disease, stage 3a: Secondary | ICD-10-CM | POA: Diagnosis present

## 2024-05-12 DIAGNOSIS — D631 Anemia in chronic kidney disease: Secondary | ICD-10-CM

## 2024-05-12 DIAGNOSIS — R768 Other specified abnormal immunological findings in serum: Secondary | ICD-10-CM

## 2024-05-12 LAB — CBC WITH DIFFERENTIAL (CANCER CENTER ONLY)
Abs Immature Granulocytes: 0.02 K/uL (ref 0.00–0.07)
Basophils Absolute: 0.1 K/uL (ref 0.0–0.1)
Basophils Relative: 1 %
Eosinophils Absolute: 0.1 K/uL (ref 0.0–0.5)
Eosinophils Relative: 3 %
HCT: 28.4 % — ABNORMAL LOW (ref 36.0–46.0)
Hemoglobin: 9.1 g/dL — ABNORMAL LOW (ref 12.0–15.0)
Immature Granulocytes: 1 %
Lymphocytes Relative: 23 %
Lymphs Abs: 0.9 K/uL (ref 0.7–4.0)
MCH: 35.3 pg — ABNORMAL HIGH (ref 26.0–34.0)
MCHC: 32 g/dL (ref 30.0–36.0)
MCV: 110.1 fL — ABNORMAL HIGH (ref 80.0–100.0)
Monocytes Absolute: 0.3 K/uL (ref 0.1–1.0)
Monocytes Relative: 7 %
Neutro Abs: 2.6 K/uL (ref 1.7–7.7)
Neutrophils Relative %: 65 %
Platelet Count: 166 K/uL (ref 150–400)
RBC: 2.58 MIL/uL — ABNORMAL LOW (ref 3.87–5.11)
RDW: 15.3 % (ref 11.5–15.5)
WBC Count: 4 K/uL (ref 4.0–10.5)
nRBC: 0 % (ref 0.0–0.2)

## 2024-05-12 LAB — CMP (CANCER CENTER ONLY)
ALT: 11 U/L (ref 0–44)
AST: 22 U/L (ref 15–41)
Albumin: 4 g/dL (ref 3.5–5.0)
Alkaline Phosphatase: 72 U/L (ref 38–126)
Anion gap: 6 (ref 5–15)
BUN: 20 mg/dL (ref 8–23)
CO2: 26 mmol/L (ref 22–32)
Calcium: 9.2 mg/dL (ref 8.9–10.3)
Chloride: 105 mmol/L (ref 98–111)
Creatinine: 1.26 mg/dL — ABNORMAL HIGH (ref 0.44–1.00)
GFR, Estimated: 47 mL/min — ABNORMAL LOW (ref >60)
Glucose, Bld: 107 mg/dL — ABNORMAL HIGH (ref 70–99)
Potassium: 3.9 mmol/L (ref 3.5–5.1)
Sodium: 137 mmol/L (ref 135–145)
Total Bilirubin: 0.8 mg/dL (ref 0.0–1.2)
Total Protein: 7 g/dL (ref 6.5–8.1)

## 2024-05-12 MED ORDER — EPOETIN ALFA 20000 UNIT/ML IJ SOLN
30000.0000 [IU] | Freq: Once | INTRAMUSCULAR | Status: AC
  Administered 2024-05-12: 30000 [IU] via SUBCUTANEOUS
  Filled 2024-05-12: qty 2

## 2024-05-12 NOTE — Progress Notes (Signed)
 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 Hemoglobin is stable.   Anemia in chronic kidney disease (CKD) Lab Results  Component Value Date   HGB 9.1 (L) 05/12/2024   TIBC 316 02/10/2024   IRONPCTSAT 20 02/10/2024   FERRITIN 77 02/10/2024    Recommend Epogen  30,000 units monthly if Hb <11 Ferritin is less than 200, recommend IV venofer weekly x 2  I discussed IV Venofer treatments. I discussed about the potential risks including but not limited to allergic reactions/infusion reactions including anaphylactic reactions, diarrhea, phlebitis, high blood pressure, wheezing, SOB, skin rash, weight gain,dark urine, leg swelling, back pain, headache, nausea and fatigue, etc. Patient agrees with the plan. recommend IV venofer weekly x 2  Hepatitis B core antibody positive History of hepatitis B  Finished course of  Entecavir  0.5mg , for 1 year after last Rituximab       CKD (chronic kidney disease) stage 3, GFR 30-59 ml/min (HCC) Encourage oral hydration.  Avoid nephrotoxins.    Orders Placed This Encounter  Procedures   CBC with Differential (Cancer Center Only)    Standing Status:   Future    Expected Date:   08/11/2024    Expiration Date:   11/09/2024   Iron and TIBC    Standing Status:   Future    Expected Date:   08/11/2024    Expiration Date:   11/09/2024   Ferritin    Standing Status:   Future    Expected Date:   08/11/2024    Expiration Date:   11/09/2024   Retic Panel    Standing Status:   Future    Expected Date:   08/11/2024    Expiration Date:   11/09/2024   CMP (Cancer Center only)    Standing Status:   Future    Expected Date:   07/07/2024    Expiration Date:   10/05/2024   CBC with Differential (Cancer Center Only)    Standing Status:   Future    Expected Date:    07/07/2024    Expiration Date:   10/05/2024   CBC with Differential (Cancer Center Only)    Standing Status:   Future    Expected Date:   06/09/2024    Expiration Date:   09/07/2024   Basic Metabolic Panel - Cancer Center Only    Standing Status:   Future    Expected Date:   06/09/2024    Expiration Date:   09/07/2024    Follow up  lab in 4 weeks + Epogen  -cbc  8 weeks, lab + Epogen  - cbc  3 months ab MD + Epogen  - cbc   All questions were answered. The patient knows to call the clinic with any problems, questions or concerns.  Zelphia Cap, MD, PhD [facility] Hematology Oncology 05/12/2024   Chief Complaint: Tina Page is a 68 y.o. 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  Reticulocyte count was 11.3%.  Ferritin was 562.  Iron studies revealed a saturation of 20% and a TIBC of 214 (low).  B12 was 231 (low normal).  Folate was 42.   Peripheral smear revealed rouleaux formation.    Additional testing included the following + studies:  hepatitis C antibody, hepatitis B core antibody, CMV IgM, and EBV VCA (IgM and IgG).  Hepatitis B by PCR was negative.  Hepatitis C RNA was negative.  Mycoplasma pneumonia IgM was negative.  Reticulocyte count was 11.3% (high) indicating appropriate marrow response.  C3 and C4 were normal.  Cold agglutinin titer was negative x 2.  Negative studies included:  ANA, hepatitis B surface antigen, SPEP, and free light chain ratio.   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  MMA was initially 330 (normal).  Repeat MMA was elevated on 08/01/2016 confirming B12 deficiency.  Hepatitis B surface antibody  was positive and hepatitis B surface antigen was negative on 08/06/2016.   07/07/2016  Chest, abdomen, and pelvis CT angiogram on revealed moderate diffuse atherosclerotic vascular disease of the abdominal aorta with severe stenosis of the left common iliac artery with suspected short segment occlusion. There was no adenopathy.  Spleen was normal.   04/15/2017 Abd US  revealed a normal spleen and sludge in the gallbladder.  The liver was echogenic consistent fatty infiltration and/or hepatocellular disease.  For hemolytic anemia treatments, in 2017, She has received multiple units of  warmed PRBCs to date.  08/01/2016-03/12/2017, steroid treatment with prednisone .  She is also on folic acid  1 mg daily. Borderline low vitamin B12 level on 07/09/2016.  Patient has been on oral vitamin B12 supplementation.  10/12/2017  positive warm autoantibody.  LDH and bilirubin were normal.   She began prednisone  10 mg on 12/07/2017 (discontinued 03/2018).  She received Rituxan  weekly x 4 (last dose 06/16/2018).  Entecavir  was discontinued on 07/06/2019.  06/12/2020 developed recurrent anemia.  Hematocrit was 27.6, hemoglobin 8.4, MCV 104.5, platelets 138,000, WBC 3,400 (ANC 2,300). Creatinine was 1.17 (CrCl 50 ml/min).  Positive warm autoantibody  Reticulocyte count was 7.1%.  LDH was 204 (98-192).  06/16/2020, began prednisone  1 mg/kilogram treatments, 07/24/2020, prednisone  was tapered down to 20 mg.  She received Rituxan  1000 mg on day 1 and 15 (07/15/2020 and 07/29/2020).  She began entecavir  on 06/28/2020.  Prednisone  was tapered off.  She was on Septra  for PCP prophylaxis which is now currently on hold    Other medical problems 07/10/2016.  She underwent PTCA and stent placement in right and left common iliac arteries and left external iliac artery. She is on Plavix .   03/19/2015 EGD revealed gastritis in the body and antrum.  Colonoscopy revealed one hyperplastic polyp.  Repeat EGD on 07/08/2016 was normal.   No evidence of bleeding.  History of she developed peri-oral and intranasal herpes simplex-1.  She was treated with valacyclovir  and doxycycline  on 10/19/2016.   10/26/2017. She developed flu like symptoms.  Symptoms included cough, myalgias, and fever (tmax unknown).  She was prescribed Mucinex, Tamiflu, and amoxicillin .  She only took amoxicillin  x 5 days.  She developed increased liver function tests on 11/02/2017. CMV IgG was positive.  EBV VCA IgG, NA IgG, early antigen antibody IgG were elevated.  EBV VCA IgM was < 36.  Testing was c/w a convalescence/past infection or reactivated infection.  LFTs normalized on 11/15/2017.  Smooth muscle antibody was 39 (high) on 11/10/2017 and 34 (high) on 11/30/2017.     Chest CT on 11/16/2017 revealed a 2.2 x 2.0 x 2.4 spiculated RIGHT middle lobe mass.  There was tiny nonspecific upper lobe pulmonary nodules  greater on RIGHT, largest 3 mm, of uncertain etiology.  There was additional 8 x 8 mm opacity in the posterior sulcus of the RIGHT lower lobe. PET scan on 11/22/2017 revealed a 2 cm hypermetabolic right middle lobe lung mass (SUV 3.4) consistent  with primary lung neoplasm.  There were no findings for mediastinal/hilar lymphadenopathy or metastatic disease.  There were areas of hypermetabolism involving the left oblique abdominal muscles and the anorectal junction. PFTs on 11/18/2017 revealed an FEV1 of 1.22 liters (51%).  DLCO adj was 6.8 mL/mmHg/min (32%).   ENB done on 12/07/2017. Cytology was negative for malignancy. Pathology demonstrated organizing pneumonia and chronic bronchitis. Pathologist commented that organizing pneumonia spanned about 2 mm in one fragment. Cases of focal organizing pneumonia with hypermetabolism on FDG-PET have been described, but changes of this type can also be adjacent to a neoplasm. Patient prescribed a daily dose of Prednisone  10 mg   07/24/2020  repeat  chest CT on  revealed Lung-RADS 2, benign appearance or behavior.  There was emphysema, aortic atherosclerosis, and coronary artery calcifications.  diarrhea.  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  She received azithromycin  x 3 days.  She received ciprofloxacin  x 10 days without improvement.  She has seen ID in [place].  She began Bactrim  on 03/04/2018 (discontinued on 03/11/2018) secondary to increase in creatine.   Chronic kidney disease Urinalysis on 11/15/2017 revealed revealed no hemoglobin, bilirubin or active sediment.   Liver function tests increased on 02/15/2019.  Work-up on 02/16/2019 revealed hepatitis B E antibody positive.  Negative studies included: hepatitis A total antibody, hepatitis A IgM, hepatitis B surface antigen, hepatitis B E antigen.  Hepatitis B DNA was not detected.  Folate was 80.5 and vitamin B12 was 443.  She has received her vaccinations:  She has completed the PCV13, PPSV23, and HiB.  She received Menvio (quadrivalent meningoccal vaccine) and Bexero (univalent serogroup B meningoccal vaccine) on 07/06/2019.  She was diagnosed with C difficile + diarrhea on 08/12/2020.  She completed a course of oral vancomycin .  Stool was negative for C. difficile on 08/27/2020.  11/17/2020 - 11/20/2020 [facility] admission with left L5 varicella zoster.  Lesion was positive for VZV. She was treated with ceftazidime  and a valacyclovir .  She was discharged on 9 days of Valtrex  1 gm 3 times daily followed by 500 mg daily indefinitely for suppression and doxycycline .  History of discoid lupus  #Positive hepatitis B core antibody Previously on entecavir  0.5 mg daily, currently she has finished a course. Dose adjusted based on renal function. CrCl 30-49 ml/min   50% dosing (0.5 mg QOD) CrCl >= 50 ml/min 100% dosing (0.5 mg a day). With her kidney function currently QOD She has completed course of Entecavir  and stopped 08/14/2021  # PCP prophylaxis Patient was on Septra  DS on Mondays, Wednesdays, and Fridays, which  was held due to fluctuated kidney functions.  Currently off Septra .  12/03/2021, CT chest lung cancer screening showed new right middle lobe pulmonary nodule with an aggressive appearance characterized as lung RADS 4 VS.  12/22/2021 PET showed No abnormal FDG avidity associated with the resolving right middle lobe pulmonary nodule now with a 12 mm ground-glass opacity in the area of prior solid nodularity, most consistent with an infectious or inflammatory etiology  INTERVAL HISTORY Tina Page is a 68 y.o. female who has above history reviewed by me today presents for follow up visit  Chronic fatigue unchanged.    She tolerates retacrit  injections.  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/23/2017   Post PTCA 07/12/2016   iliac stents bilaterally 2017   Thrombocytopenia (HCC) 10/04/2016   Wears dentures    full upper and lower    Past Surgical History:  Procedure Laterality Date   ELECTROMAGNETIC NAVIGATION BROCHOSCOPY N/A 12/07/2017   Procedure: ELECTROMAGNETIC NAVIGATION BRONCHOSCOPY;  Surgeon: Isaiah Scrivener, MD;  Location: [facility] ORS;  Service: Cardiopulmonary;  Laterality: N/A;   ESOPHAGOGASTRODUODENOSCOPY (EGD) WITH PROPOFOL  N/A 07/08/2016   Procedure: ESOPHAGOGASTRODUODENOSCOPY (EGD) WITH PROPOFOL ;  Surgeon: Lamar ONEIDA Holmes, MD;  Location: [facility] ENDOSCOPY;  Service: Endoscopy;  Laterality: N/A;   KNEE SURGERY Right    repair of acl tear   PERIPHERAL VASCULAR CATHETERIZATION N/A 07/10/2016    Procedure: Lower Extremity Angiography;  Surgeon: Cordella KANDICE Shawl, MD;  Location: [facility] INVASIVE CV LAB;  Service: Cardiovascular;  Laterality: N/A;   PERIPHERAL VASCULAR CATHETERIZATION N/A 07/10/2016   Procedure: Abdominal Aortogram w/Lower Extremity;  Surgeon: Cordella KANDICE Shawl, MD;  Location: [facility] INVASIVE CV LAB;  Service: Cardiovascular;  Laterality: N/A;   PERIPHERAL VASCULAR CATHETERIZATION  07/10/2016   Procedure: Lower Extremity Intervention;  Surgeon: Cordella KANDICE Shawl, MD;  Location: [facility] INVASIVE CV LAB;  Service: Cardiovascular;;    Family History  Problem Relation Age of Onset   Diabetes Mother    Hypertension Mother    Diabetes Maternal Grandfather    Hypertension Maternal Grandfather    Breast cancer Neg Hx     Social History:  reports that she quit smoking about 7 years ago. Her smoking use included cigarettes. She started smoking about 54 years ago. She has a 58.8 pack-year smoking history. She has never used smokeless tobacco. She reports that she does not drink alcohol and does not use drugs.  Former smoker, 58-pack-year smoking history.   She has a daughter, Jeanine and a daughter named [name].  She lives in [place]. The patient is alone today.  Allergies:  Allergies  Allergen Reactions   Ciprofloxacin  Swelling    Facial swelling following single oral dose.    Codeine Anaphylaxis   Nsaids Other (See Comments)    Patient has Hep C.     Current Medications: Current Outpatient Medications  Medication Sig Dispense Refill   albuterol  (VENTOLIN  HFA) 108 (90 Base) MCG/ACT inhaler SMARTSIG:2 Puff(s) By Mouth Every 4 Hours PRN     allopurinol  (ZYLOPRIM ) 100 MG tablet TAKE 1 TABLET BY MOUTH EVERY DAY 30 tablet 5   budesonide-glycopyrrolate-formoterol (BREZTRI  AEROSPHERE) 160-9-4.8 MCG/ACT AERO inhaler Inhale 2 puffs into the lungs in the morning and at bedtime. 3 each 3   CALCIUM  PO Take by mouth daily at 12 noon.     clopidogrel  (PLAVIX ) 75 MG tablet Take 1 tablet  (75 mg total) by mouth daily. 30 tablet 5   cyanocobalamin  500 MCG tablet Take 500 mcg by mouth daily.     Ensifentrine  (OHTUVAYRE ) 3 MG/2.5ML SUSP Take 3 mg by nebulization 2 (two) times daily.     hydroxychloroquine  (PLAQUENIL ) 200  MG tablet Take 200 mg by mouth 2 (two) times daily.     levothyroxine  (SYNTHROID , LEVOTHROID) 75 MCG tablet Take 75 mcg by mouth daily before breakfast.      lisinopril -hydrochlorothiazide  (ZESTORETIC ) 20-25 MG tablet Take 1 tablet by mouth once daily 90 tablet 3   pantoprazole  (PROTONIX ) 40 MG tablet Take 1 tablet by mouth once daily 30 tablet 0   rosuvastatin  (CRESTOR ) 40 MG tablet Take 1 tablet (40 mg total) by mouth daily. 90 tablet 3   VITAMIN D PO Take by mouth daily in the afternoon.     No current facility-administered medications for this visit.    Review of Systems  Constitutional:  Negative for appetite change, chills, fatigue and fever.  HENT:   Negative for hearing loss and voice change.   Eyes:  Negative for eye problems.  Respiratory:  Negative for chest tightness and cough.   Cardiovascular:  Negative for chest pain.  Gastrointestinal:  Negative for abdominal distention, abdominal pain and blood in stool.  Endocrine: Negative for hot flashes.  Genitourinary:  Negative for difficulty urinating and frequency.   Musculoskeletal:  Negative for arthralgias.  Skin:  Negative for itching and rash.  Neurological:  Negative for extremity weakness.  Hematological:  Negative for adenopathy.  Psychiatric/Behavioral:  Negative for confusion. The patient is not nervous/anxious.      Performance status (ECOG): 1  Vital Signs Blood pressure (!) 149/60, pulse 86, temperature 97.8 F (36.6 C), temperature source Tympanic, resp. rate 18, weight 180 lb 1.6 oz (81.7 kg), SpO2 98%.  Physical Exam Constitutional:      General: She is not in acute distress.    Appearance: She is obese. She is not diaphoretic.  HENT:     Head: Normocephalic and  atraumatic.  Eyes:     General: No scleral icterus. Cardiovascular:     Rate and Rhythm: Normal rate and regular rhythm.     Heart sounds: No murmur heard. Pulmonary:     Effort: Pulmonary effort is normal. No respiratory distress.  Abdominal:     General: There is no distension.     Palpations: Abdomen is soft.     Tenderness: There is no abdominal tenderness.  Musculoskeletal:        General: Normal range of motion.     Cervical back: Normal range of motion and neck supple.  Skin:    General: Skin is warm and dry.     Findings: No erythema.  Neurological:     Mental Status: She is alert and oriented to person, place, and time. Mental status is at baseline.     Motor: No abnormal muscle tone.  Psychiatric:        Mood and Affect: Mood and affect normal.      Laboratory findings     Latest Ref Rng & Units 05/12/2024   10:53 AM 04/06/2024   10:57 AM 03/09/2024   10:59 AM  CBC  WBC 4.0 - 10.5 K/uL 4.0  5.3  4.2   Hemoglobin 12.0 - 15.0 g/dL 9.1  9.5  9.5   Hematocrit 36.0 - 46.0 % 28.4  29.1  29.9   Platelets 150 - 400 K/uL 166  169  163       Latest Ref Rng & Units 05/12/2024   10:54 AM 04/06/2024   10:56 AM 03/09/2024   10:59 AM  CMP  Glucose 70 - 99 mg/dL 892  92  92   BUN 8 - 23 mg/dL 20  22  27   Creatinine 0.44 - 1.00 mg/dL 8.73  8.38  8.26   Sodium 135 - 145 mmol/L 137  141  139   Potassium 3.5 - 5.1 mmol/L 3.9  4.1  4.4   Chloride 98 - 111 mmol/L 105  105  104   CO2 22 - 32 mmol/L 26  28  26    Calcium  8.9 - 10.3 mg/dL 9.2  9.5  9.4   Total Protein 6.5 - 8.1 g/dL 7.0  7.0    Total Bilirubin 0.0 - 1.2 mg/dL 0.8  1.0    Alkaline Phos 38 - 126 U/L 72  64    AST 15 - 41 U/L 22  18    ALT 0 - 44 U/L 11  9

## 2024-05-12 NOTE — Assessment & Plan Note (Signed)
 S/p Rituximab  weekly x 4  Labs are reviewed and discussed with patient. Hemoglobin is stable.

## 2024-05-12 NOTE — Assessment & Plan Note (Addendum)
Encourage oral hydration. Avoid nephrotoxins. 

## 2024-05-12 NOTE — Assessment & Plan Note (Addendum)
 Lab Results  Component Value Date   HGB 9.1 (L) 05/12/2024   TIBC 316 02/10/2024   IRONPCTSAT 20 02/10/2024   FERRITIN 77 02/10/2024    Recommend Epogen  30,000 units monthly if Hb <11 Ferritin is less than 200, recommend IV venofer weekly x 2  I discussed IV Venofer treatments. I discussed about the potential risks including but not limited to allergic reactions/infusion reactions including anaphylactic reactions, diarrhea, phlebitis, high blood pressure, wheezing, SOB, skin rash, weight gain,dark urine, leg swelling, back pain, headache, nausea and fatigue, etc. Patient agrees with the plan. recommend IV venofer weekly x 2

## 2024-05-12 NOTE — Assessment & Plan Note (Signed)
 History of hepatitis B  Finished course of  Entecavir  0.5mg , for 1 year after last Rituximab 

## 2024-05-15 ENCOUNTER — Encounter: Payer: Self-pay | Admitting: Pulmonary Disease

## 2024-05-15 ENCOUNTER — Other Ambulatory Visit: Payer: Self-pay

## 2024-05-15 ENCOUNTER — Ambulatory Visit: Admitting: Pulmonary Disease

## 2024-05-15 ENCOUNTER — Emergency Department

## 2024-05-15 ENCOUNTER — Inpatient Hospital Stay
Admission: EM | Admit: 2024-05-15 | Discharge: 2024-05-23 | DRG: 535 | Disposition: A | Discharge status: Skilled Nursing Facility | Attending: Student | Admitting: Student

## 2024-05-15 VITALS — BP 136/70 | HR 84 | Temp 97.8°F | Ht 63.0 in | Wt 181.4 lb

## 2024-05-15 DIAGNOSIS — Z6832 Body mass index (BMI) 32.0-32.9, adult: Secondary | ICD-10-CM

## 2024-05-15 DIAGNOSIS — S72114A Nondisplaced fracture of greater trochanter of right femur, initial encounter for closed fracture: Secondary | ICD-10-CM | POA: Diagnosis not present

## 2024-05-15 DIAGNOSIS — B259 Cytomegaloviral disease, unspecified: Secondary | ICD-10-CM | POA: Diagnosis present

## 2024-05-15 DIAGNOSIS — Z886 Allergy status to analgesic agent status: Secondary | ICD-10-CM

## 2024-05-15 DIAGNOSIS — Z1152 Encounter for screening for COVID-19: Secondary | ICD-10-CM

## 2024-05-15 DIAGNOSIS — G9341 Metabolic encephalopathy: Secondary | ICD-10-CM | POA: Diagnosis not present

## 2024-05-15 DIAGNOSIS — D591 Autoimmune hemolytic anemia, unspecified: Secondary | ICD-10-CM

## 2024-05-15 DIAGNOSIS — M109 Gout, unspecified: Secondary | ICD-10-CM | POA: Diagnosis present

## 2024-05-15 DIAGNOSIS — D5912 Cold autoimmune hemolytic anemia: Secondary | ICD-10-CM | POA: Diagnosis present

## 2024-05-15 DIAGNOSIS — D696 Thrombocytopenia, unspecified: Secondary | ICD-10-CM | POA: Diagnosis present

## 2024-05-15 DIAGNOSIS — I10 Essential (primary) hypertension: Secondary | ICD-10-CM | POA: Diagnosis present

## 2024-05-15 DIAGNOSIS — Z79890 Hormone replacement therapy (postmenopausal): Secondary | ICD-10-CM

## 2024-05-15 DIAGNOSIS — S72109A Unspecified trochanteric fracture of unspecified femur, initial encounter for closed fracture: Secondary | ICD-10-CM | POA: Diagnosis present

## 2024-05-15 DIAGNOSIS — M359 Systemic involvement of connective tissue, unspecified: Secondary | ICD-10-CM

## 2024-05-15 DIAGNOSIS — S72001A Fracture of unspecified part of neck of right femur, initial encounter for closed fracture: Secondary | ICD-10-CM

## 2024-05-15 DIAGNOSIS — N189 Chronic kidney disease, unspecified: Secondary | ICD-10-CM | POA: Diagnosis present

## 2024-05-15 DIAGNOSIS — J449 Chronic obstructive pulmonary disease, unspecified: Secondary | ICD-10-CM | POA: Diagnosis present

## 2024-05-15 DIAGNOSIS — N183 Chronic kidney disease, stage 3 unspecified: Secondary | ICD-10-CM | POA: Diagnosis present

## 2024-05-15 DIAGNOSIS — D631 Anemia in chronic kidney disease: Secondary | ICD-10-CM | POA: Diagnosis present

## 2024-05-15 DIAGNOSIS — K219 Gastro-esophageal reflux disease without esophagitis: Secondary | ICD-10-CM | POA: Diagnosis present

## 2024-05-15 DIAGNOSIS — Z7951 Long term (current) use of inhaled steroids: Secondary | ICD-10-CM

## 2024-05-15 DIAGNOSIS — J4489 Other specified chronic obstructive pulmonary disease: Secondary | ICD-10-CM | POA: Diagnosis not present

## 2024-05-15 DIAGNOSIS — I739 Peripheral vascular disease, unspecified: Secondary | ICD-10-CM | POA: Diagnosis present

## 2024-05-15 DIAGNOSIS — W010XXA Fall on same level from slipping, tripping and stumbling without subsequent striking against object, initial encounter: Secondary | ICD-10-CM | POA: Diagnosis present

## 2024-05-15 DIAGNOSIS — M25551 Pain in right hip: Secondary | ICD-10-CM | POA: Diagnosis not present

## 2024-05-15 DIAGNOSIS — Z87891 Personal history of nicotine dependence: Secondary | ICD-10-CM

## 2024-05-15 DIAGNOSIS — Z8619 Personal history of other infectious and parasitic diseases: Secondary | ICD-10-CM

## 2024-05-15 DIAGNOSIS — S72101G Unspecified trochanteric fracture of right femur, subsequent encounter for closed fracture with delayed healing: Secondary | ICD-10-CM

## 2024-05-15 DIAGNOSIS — Z833 Family history of diabetes mellitus: Secondary | ICD-10-CM

## 2024-05-15 DIAGNOSIS — Z7902 Long term (current) use of antithrombotics/antiplatelets: Secondary | ICD-10-CM

## 2024-05-15 DIAGNOSIS — I129 Hypertensive chronic kidney disease with stage 1 through stage 4 chronic kidney disease, or unspecified chronic kidney disease: Secondary | ICD-10-CM | POA: Diagnosis present

## 2024-05-15 DIAGNOSIS — R918 Other nonspecific abnormal finding of lung field: Secondary | ICD-10-CM

## 2024-05-15 DIAGNOSIS — J029 Acute pharyngitis, unspecified: Secondary | ICD-10-CM | POA: Diagnosis not present

## 2024-05-15 DIAGNOSIS — E039 Hypothyroidism, unspecified: Secondary | ICD-10-CM | POA: Diagnosis present

## 2024-05-15 DIAGNOSIS — Z881 Allergy status to other antibiotic agents status: Secondary | ICD-10-CM

## 2024-05-15 DIAGNOSIS — R4182 Altered mental status, unspecified: Secondary | ICD-10-CM | POA: Diagnosis not present

## 2024-05-15 DIAGNOSIS — N1831 Chronic kidney disease, stage 3a: Secondary | ICD-10-CM | POA: Diagnosis present

## 2024-05-15 DIAGNOSIS — E785 Hyperlipidemia, unspecified: Secondary | ICD-10-CM | POA: Diagnosis present

## 2024-05-15 DIAGNOSIS — I2510 Atherosclerotic heart disease of native coronary artery without angina pectoris: Secondary | ICD-10-CM | POA: Diagnosis present

## 2024-05-15 DIAGNOSIS — Y92008 Other place in unspecified non-institutional (private) residence as the place of occurrence of the external cause: Secondary | ICD-10-CM

## 2024-05-15 DIAGNOSIS — N179 Acute kidney failure, unspecified: Secondary | ICD-10-CM | POA: Diagnosis present

## 2024-05-15 DIAGNOSIS — E66811 Obesity, class 1: Secondary | ICD-10-CM | POA: Diagnosis present

## 2024-05-15 DIAGNOSIS — E860 Dehydration: Secondary | ICD-10-CM | POA: Diagnosis present

## 2024-05-15 DIAGNOSIS — Z79899 Other long term (current) drug therapy: Secondary | ICD-10-CM

## 2024-05-15 DIAGNOSIS — R768 Other specified abnormal immunological findings in serum: Secondary | ICD-10-CM | POA: Diagnosis present

## 2024-05-15 DIAGNOSIS — Z8249 Family history of ischemic heart disease and other diseases of the circulatory system: Secondary | ICD-10-CM

## 2024-05-15 DIAGNOSIS — W19XXXA Unspecified fall, initial encounter: Principal | ICD-10-CM

## 2024-05-15 DIAGNOSIS — Z885 Allergy status to narcotic agent status: Secondary | ICD-10-CM

## 2024-05-15 DIAGNOSIS — J309 Allergic rhinitis, unspecified: Secondary | ICD-10-CM

## 2024-05-15 LAB — CBC
HCT: 29.2 % — ABNORMAL LOW (ref 36.0–46.0)
Hemoglobin: 9.2 g/dL — ABNORMAL LOW (ref 12.0–15.0)
MCH: 35.1 pg — ABNORMAL HIGH (ref 26.0–34.0)
MCHC: 31.5 g/dL (ref 30.0–36.0)
MCV: 111.5 fL — ABNORMAL HIGH (ref 80.0–100.0)
Platelets: 183 K/uL (ref 150–400)
RBC: 2.62 MIL/uL — ABNORMAL LOW (ref 3.87–5.11)
RDW: 16.3 % — ABNORMAL HIGH (ref 11.5–15.5)
WBC: 7.5 K/uL (ref 4.0–10.5)
nRBC: 0.3 % — ABNORMAL HIGH (ref 0.0–0.2)

## 2024-05-15 LAB — BASIC METABOLIC PANEL WITH GFR
Anion gap: 11 (ref 5–15)
BUN: 24 mg/dL — ABNORMAL HIGH (ref 8–23)
CO2: 29 mmol/L (ref 22–32)
Calcium: 9.7 mg/dL (ref 8.9–10.3)
Chloride: 101 mmol/L (ref 98–111)
Creatinine, Ser: 1.5 mg/dL — ABNORMAL HIGH (ref 0.44–1.00)
GFR, Estimated: 38 mL/min — ABNORMAL LOW (ref >60)
Glucose, Bld: 107 mg/dL — ABNORMAL HIGH (ref 70–99)
Potassium: 3.7 mmol/L (ref 3.5–5.1)
Sodium: 141 mmol/L (ref 135–145)

## 2024-05-15 LAB — POC COVID19 BINAXNOW: SARS Coronavirus 2 Ag: NEGATIVE

## 2024-05-15 MED ORDER — ALBUTEROL SULFATE HFA 108 (90 BASE) MCG/ACT IN AERS: 1.0000 | INHALATION_SPRAY | RESPIRATORY_TRACT | Status: DC | PRN

## 2024-05-15 MED ORDER — PANTOPRAZOLE SODIUM 40 MG PO TBEC
40.0000 mg | DELAYED_RELEASE_TABLET | Freq: Every day | ORAL | Status: DC
  Administered 2024-05-16 – 2024-05-23 (×8): 40 mg via ORAL
  Filled 2024-05-15 (×8): qty 1

## 2024-05-15 MED ORDER — MORPHINE SULFATE (PF) 2 MG/ML IV SOLN
2.0000 mg | INTRAVENOUS | Status: DC | PRN
  Administered 2024-05-15 – 2024-05-17 (×2): 2 mg via INTRAVENOUS
  Filled 2024-05-15 (×4): qty 1

## 2024-05-15 MED ORDER — ONDANSETRON HCL 4 MG PO TABS: 4.0000 mg | ORAL_TABLET | Freq: Four times a day (QID) | ORAL | Status: DC | PRN

## 2024-05-15 MED ORDER — HYDROCHLOROTHIAZIDE 25 MG PO TABS
25.0000 mg | ORAL_TABLET | Freq: Every day | ORAL | Status: DC
  Administered 2024-05-16 – 2024-05-18 (×3): 25 mg via ORAL
  Filled 2024-05-15 (×3): qty 1

## 2024-05-15 MED ORDER — LEVOTHYROXINE SODIUM 50 MCG PO TABS
75.0000 ug | ORAL_TABLET | Freq: Every day | ORAL | Status: DC
  Administered 2024-05-16 – 2024-05-23 (×8): 75 ug via ORAL
  Filled 2024-05-15 (×8): qty 2

## 2024-05-15 MED ORDER — LISINOPRIL 20 MG PO TABS
20.0000 mg | ORAL_TABLET | Freq: Every day | ORAL | Status: DC
  Administered 2024-05-16 – 2024-05-18 (×3): 20 mg via ORAL
  Filled 2024-05-15 (×3): qty 1

## 2024-05-15 MED ORDER — ACETAMINOPHEN 650 MG RE SUPP: 650.0000 mg | Freq: Four times a day (QID) | RECTAL | Status: DC | PRN

## 2024-05-15 MED ORDER — OXYCODONE HCL 5 MG PO TABS
5.0000 mg | ORAL_TABLET | Freq: Once | ORAL | Status: AC
  Administered 2024-05-15: 5 mg via ORAL
  Filled 2024-05-15: qty 1

## 2024-05-15 MED ORDER — ROSUVASTATIN CALCIUM 20 MG PO TABS
40.0000 mg | ORAL_TABLET | Freq: Every day | ORAL | Status: DC
  Administered 2024-05-16 – 2024-05-23 (×8): 40 mg via ORAL
  Filled 2024-05-15: qty 2
  Filled 2024-05-15: qty 4
  Filled 2024-05-15 (×6): qty 2

## 2024-05-15 MED ORDER — LISINOPRIL-HYDROCHLOROTHIAZIDE 20-25 MG PO TABS: 1.0000 | ORAL_TABLET | Freq: Every day | ORAL | Status: DC

## 2024-05-15 MED ORDER — HYDROCODONE-ACETAMINOPHEN 5-325 MG PO TABS
1.0000 | ORAL_TABLET | ORAL | Status: DC | PRN
  Administered 2024-05-16: 2 via ORAL
  Administered 2024-05-16: 1 via ORAL
  Administered 2024-05-16 – 2024-05-17 (×3): 2 via ORAL
  Administered 2024-05-17 – 2024-05-22 (×7): 1 via ORAL
  Filled 2024-05-15 (×4): qty 1
  Filled 2024-05-15: qty 2
  Filled 2024-05-15: qty 1
  Filled 2024-05-15: qty 2
  Filled 2024-05-15 (×3): qty 1
  Filled 2024-05-15: qty 2
  Filled 2024-05-15: qty 1
  Filled 2024-05-15: qty 2

## 2024-05-15 MED ORDER — ACETAMINOPHEN 325 MG PO TABS
650.0000 mg | ORAL_TABLET | Freq: Four times a day (QID) | ORAL | Status: DC | PRN
  Administered 2024-05-16 – 2024-05-21 (×3): 650 mg via ORAL
  Filled 2024-05-15 (×3): qty 2

## 2024-05-15 MED ORDER — ALBUTEROL SULFATE (2.5 MG/3ML) 0.083% IN NEBU: 2.5000 mg | INHALATION_SOLUTION | RESPIRATORY_TRACT | Status: DC | PRN

## 2024-05-15 MED ORDER — ONDANSETRON HCL 4 MG/2ML IJ SOLN
4.0000 mg | Freq: Four times a day (QID) | INTRAMUSCULAR | Status: DC | PRN
  Administered 2024-05-16 – 2024-05-19 (×4): 4 mg via INTRAVENOUS
  Filled 2024-05-15 (×4): qty 2

## 2024-05-15 NOTE — Assessment & Plan Note (Signed)
 Followed by hematology S/p treatment with rituximab 

## 2024-05-15 NOTE — Progress Notes (Signed)
 Subjective:    Patient ID: Tina Page, female    DOB: Jun 22, 1956, 68 y.o.   MRN: [id number]  Patient Care Team: Adina Buel HERO, MD as PCP - General (Family Medicine) Epifanio Alm SQUIBB, MD (Infectious Diseases) Verdene Gills, RN as Registered Nurse Jinny Carmine, MD as Consulting Physician (Gastroenterology) Fayette Bodily, MD as Consulting Physician (Infectious Diseases) Perla Evalene PARAS, MD as Consulting Physician (Cardiology) Babara Call, MD as Consulting Physician (Oncology)  Chief Complaint  Patient presents with   COPD    Shortness of breath on exertion.     BACKGROUND/INTERVAL:Tina Page is a very complex 68 year old female, former smoker, quit in 2017 (63-pack-year history). 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 coli, impetigo, autoimmune hemolytic anemia, B12 deficiency, elevated liver function testing, hyperlipidemia, pulmonary nodules and undifferentiated connective tissue disease.  She presents today for scheduled follow-up.  Last seen on 07 Jan 2024 by me.    HPI Discussed the use of AI scribe software for clinical note transcription with the patient, who gave verbal consent to proceed.  History of Present Illness   Tina Page is a very complex 68 year old female with COPD and asthma overlap who presents with a sore throat and cough.  She has been experiencing a sore throat, which she attributes to recent exposure to more social environments after a period of isolation.  Alongside the sore throat, she has developed a cough. No fever is present.  No sputum production per se.  No hemoptysis.  She is currently using Advair for her COPD and asthma, which she finds beneficial.  She is also on Ohtuvayre  and is compliant.    DATA PFTS: 11/18/2017 PFTs: FEV1 1.22 L or 51% predicted, FVC 1.88 L or 63% predicted, FEV1/FVC 65%, no bronchodilator response.  Lung  volumes normal, diffusion capacity moderate to severely impaired. 07/07/2022 PFTs: FEV1 0.86 L or 36% predicted, FVC 1.34 L or 43% predicted, FEV1/FVC 64%, there is significant bronchodilator response with both response in FEV1 and FVC improvement.  No hyperinflation, air trapping evident. Moderate to severe diffusion capacity impairment.  There is modest decline in lung function when compared to prior. Consistent with asthma/COPD overlap. 05/25/2023 PFTs: FEV1 0.84 L or 35% predicted, FVC 1.29 L or 42% predicted, FEV1/FVC 65%, there is significant postbronchodilator response.  Lung volumes show air trapping.  Diffusion capacity severely impaired.   IMAGING: 02/16/18 CT chest wo contrast- showed almost complete resolution of spiculated nodules RML. Stable 5mm right parahilar nodules since March 2019 but not present in 2017.  06/27/19 CT chest wo contrast-  previously described nodule of the right middle lobe has almost completely resolved, with residual linear scarring. There is persistent paramedian consolidation of the medial right middle lobe and lingula generally in keeping with atypical infection, particularly atypical mycobacterium. Interval increase in size of a small nodule of the right middle lobe, now measuring 4 mm, previously 2 mm Unchanged tiny biapical pulmonary nodules, benign sequelae of nonspecific infection or inflammation, possibly related cigarette smoking, atypical infection, or other inhalational, inflammatory lung disease. 12/27/19 CT Chest wo contrast- stable sub-centimeter bilateral pulmonary nodules consistent with benign etiology. Stable paramedian consolidation RML and lingula. New airspace disease inferior RML with a few adjacent subcenterimeter nodules. Overall, consistent with waxing and waning atypical infectious process such as MAI 07/24/2020 chest LDCT: Lung RADS 2, benign appearance or behavior.  Moderate centrilobular emphysema, multiple nonsolid and solid tiny nodules  largest of which  was 4.2 mm 12/02/2021 LDCT chest: New right middle lobe pulmonary nodule with aggressive appearance categorized as lung RADS 4 BS, suspicious.  Mild diffuse bronchial wall thickening and mild centrilobular and paraseptal emphysema imaging findings suggestive of underlying COPD 12/22/2021 PET/CT: The previously noted right middle pulmonary nodule appears to be resolving, no FDG avidity noted.  Consistent with infectious/inflammatory etiology.  Recommend continued radiographic follow-up to resolution.  Hypermetabolic activity in the intercostal muscles and crura of the diaphragm reflecting physiologic activity related to ventilation 03/05/2022 LDCT chest: Lung RADS 3, probably benign findings, new 5.9 mm right lower lobe nodule, short-term follow-up 6 months recommended.  Continued decrease in the size of the right middle lobe nodule. 09/07/2022 LDCT chest: Lung RADS 4A, suspicious, small solid anterior right lower lobe pulmonary nodule measuring 5.2 mm below PET/CT resolution, recommend follow-up CT 3 months. 10/23/2022 echocardiogram: LVEF 60 to 65% mild LVH, no wall motion abnormalities, grade 1 DD, right ventricular systolic function, no pulmonary artery hypertension.  No valvular abnormalities. 12/07/2022 LDCT chest: Lung RADS 4A, suspicious, minimally progressive irregular nodule in the right lower lobe measuring 6.1 mm still below PET/CT resolution, suggest additional follow-up in 3 months. 03/09/2023 LDCT chest: Lung RADS 4A, suspicious, 7.2 mm right lower lobe nodule remains mildly progressive however still below the threshold for PET sensitivity.  Additional repeat low-dose CT in 3 to 6 months. 06/01/2023 LDCT chest: Stable pulmonary nodules.  Scattered pulmonary parenchymal scarring.  Lung RADS 2 benign appearance of behavior, continue screening low-dose chest CT without contrast in 12 months.  Emphysema.    Review of Systems A 10 point review of systems was performed and it is  as noted above otherwise negative.   Patient Active Problem List   Diagnosis Date Noted   Anemia in chronic kidney disease (CKD) 03/30/2022   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   Preseptal cellulitis of right eye 08/29/2021   Leukopenia 11/22/2020   Herpes zoster without complication    Left leg pain 11/17/2020   Diarrhea 09/01/2020   Clostridium difficile diarrhea 08/26/2020   Lupus 08/11/2020   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HCC) 08/11/2020   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) 08/11/2020   Papular rash 08/05/2020   Bacteremia due to Escherichia coli 07/17/2020   UTI (urinary tract infection) 07/16/2020   Hypothyroidism    Fever    Encounter for antineoplastic immunotherapy 07/15/2020   Goals of care, counseling/discussion 06/19/2020   Abnormal CT of the chest 06/04/2020   Oral candidiasis 06/04/2020   Former smoker 06/04/2020   Pulmonary nodules 12/28/2019   CKD (chronic kidney disease) stage 3, GFR 30-59 ml/min (HCC) 05/15/2019   Postop check 04/24/2019   Paronychia of great toe of right foot 04/17/2019   Ingrowing nail 04/17/2019   Medication monitoring encounter 03/25/2018   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   Elevated liver function tests 11/03/2017   Elevated uric acid in blood 11/03/2017   Low serum cortisol level 01/04/2017   Elevated alkaline phosphatase level 10/25/2016   Oral herpes simplex infection 10/25/2016   Current chronic use of systemic steroids 10/23/2016   Impetigo 10/23/2016   Thrombocytopenia (HCC) 10/04/2016   B12 deficiency 08/06/2016  Symptomatic anemia 07/21/2016    Atherosclerosis of native arteries of extremity with intermittent claudication (HCC) 07/20/2016   PAD (peripheral artery disease) (HCC) 07/12/2016   Post PTCA 07/12/2016   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   Autoimmune hemolytic anemia, 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/12/2016   Hepatitis B core antibody positive 07/12/2016   Tobacco abuse 07/12/2016   Leg pain 07/07/2016   Essential hypertension 07/07/2016   HLD (hyperlipidemia) 07/07/2016   COPD (chronic obstructive pulmonary disease) (HCC) 07/07/2016   Acute kidney injury superimposed on CKD (HCC) 07/07/2016   Heme positive stool 01/29/2015    Social History   Tobacco Use   Smoking status: Former    Current packs/day: 0.00    Average packs/day: 1.3 packs/day for 47.0 years (58.8 ttl pk-yrs)    Types: Cigarettes    Start date: 07/11/1969    Quit date: 07/11/2016    Years since quitting: 7.8   Smokeless tobacco: Never  Substance Use Topics   Alcohol use: No    Allergies  Allergen Reactions   Ciprofloxacin  Swelling    Facial swelling following single oral dose.    Codeine Anaphylaxis   Nsaids Other (See Comments)    Patient has Hep C.     Current Meds  Medication Sig   albuterol  (VENTOLIN  HFA) 108 (90 Base) MCG/ACT inhaler SMARTSIG:2 Puff(s) By Mouth Every 4 Hours PRN   allopurinol  (ZYLOPRIM ) 100 MG tablet TAKE 1 TABLET BY MOUTH EVERY DAY   budesonide -glycopyrrolate -formoterol  (BREZTRI  AEROSPHERE) 160-9-4.8 MCG/ACT AERO inhaler Inhale 2 puffs into the lungs in the morning and at bedtime.   CALCIUM  PO Take by mouth daily at 12 noon.   clopidogrel  (PLAVIX ) 75 MG tablet Take 1 tablet (75 mg total) by mouth daily.   cyanocobalamin  500 MCG tablet Take 500 mcg by mouth daily.   Ensifentrine  (OHTUVAYRE ) 3 MG/2.5ML SUSP Take 3 mg by nebulization 2 (two) times daily.   hydroxychloroquine  (PLAQUENIL ) 200 MG tablet Take 200 mg by mouth 2 (two) times daily.   levothyroxine  (SYNTHROID , LEVOTHROID) 75 MCG tablet Take 75 mcg by  mouth daily before breakfast.    lisinopril -hydrochlorothiazide  (ZESTORETIC ) 20-25 MG tablet Take 1 tablet by mouth once daily   pantoprazole  (PROTONIX ) 40 MG tablet Take 1 tablet by mouth once daily   rosuvastatin  (CRESTOR ) 40 MG tablet Take 1 tablet (40 mg total) by mouth daily.   VITAMIN D  PO Take by mouth daily in the afternoon.    Immunization History  Administered Date(s) Administered   Fluad Trivalent(High Dose 65+) 05/13/2023   HIB (PRP-T) 04/14/2018   Influenza Split 05/29/2011, 05/25/2013   Influenza,inj,Quad PF,6+ Mos 05/17/2018, 07/02/2020, 05/20/2022   Influenza,inj,quad, With Preservative 05/17/2014   Influenza-Unspecified 05/17/2018, 05/16/2019   Meningococcal B, OMV 07/06/2019   Meningococcal Mcv4o 07/06/2019, 07/02/2020   Moderna Sars-Covid-2 Vaccination 11/14/2019, 12/08/2019   Pfizer(Comirnaty)Fall Seasonal Vaccine 12 years and older 07/07/2023   Pneumococcal Conjugate-13 04/14/2018   Pneumococcal Polysaccharide-23 07/22/2016   Tdap 05/29/2011, 10/18/2014, 11/18/2020        Objective:     BP 136/70   Pulse 84   Temp 97.8 F (36.6 C) (Temporal)   Ht 5' 3 (1.6 m)   Wt 181 lb 6.4 oz (82.3 kg)   SpO2 96%   BMI 32.13 kg/m   SpO2: 96 %  GENERAL: Overweight woman, no acute distress, fully ambulatory.  No conversational dyspnea. HEAD: Normocephalic, atraumatic.  EYES: Pupils equal, round, reactive to light.  No scleral icterus.  MOUTH: Poor dentition, oral mucosa moist.  No thrush.  Mild erythema posterior pharynx.  No exudates NECK: Supple. No thyromegaly. Trachea midline. No JVD.  No adenopathy. PULMONARY: Good air entry bilaterally.  No adventitious sounds noted. CARDIOVASCULAR: S1 and S2. Regular rate and rhythm.  No rubs, murmurs or gallops heard. ABDOMEN: Obese, otherwise benign. MUSCULOSKELETAL: No joint deformity, no clubbing, no edema.  NEUROLOGIC: No overt focal deficit, no gait disturbance, speech is fluent. SKIN: Intact,warm,dry.  Multiple  petechiae noted.   PSYCH: Mood and behavior normal.  POC COVID 19: Negative        Assessment & Plan:     ICD-10-CM   1. Stage 3 severe COPD by GOLD classification (HCC)  J44.9     2. Acute pharyngitis, unspecified etiology  J02.9 POC COVID-19    3. Undifferentiated connective tissue disease (HCC)  M35.9     4. Pulmonary nodules  R91.8     5. 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 Breathing is satisfactory, and Advair is beneficial. Lung examination is unremarkable. - Continue current nebulizer treatments - Follow up in three months unless symptoms worsen  Allergic rhinitis Allergies are exacerbated, consistent with the season.  Acute pharyngitis Sore throat reported, possibly due to recent exposure to pathogens. No fever present. COVID-19 test negative. - Monitor symptoms and report if cough worsens or changes in character      Advised if symptoms do not improve or worsen, to please contact office for sooner follow up or seek emergency care.    I spent 32 minutes of dedicated to the care of this patient on the date of this encounter to include pre-visit review of records, face-to-face time with the patient discussing conditions above, post visit ordering of testing, clinical documentation with the electronic health record, making appropriate referrals as documented, and communicating necessary findings to members of the patients care team.     C. Leita Sanders, MD Advanced Bronchoscopy PCCM [facility]    *This note was generated using voice recognition software/Dragon and/or AI transcription program.  Despite best efforts to proofread, errors can occur which can change the meaning. Any transcriptional errors that result from this process are unintentional and may not be fully corrected at the  time of dictation.

## 2024-05-15 NOTE — Assessment & Plan Note (Signed)
 S/p treatment with Entecavir 

## 2024-05-15 NOTE — Assessment & Plan Note (Signed)
 History of lower extremity vascular stents Continue rosuvastatin  and antiplatelet

## 2024-05-15 NOTE — ED Provider Notes (Cosign Needed Addendum)
 [facility] Emergency Department Provider Note     Event Date/Time   First MD Initiated Contact with Patient 05/15/24 2015     (approximate)   History   Fall   HPI  Tina Page is a 68 y.o. female with a history of HTN, COPD, thrombocytopenia, hypothyroidism, HLD, anemia and hepatitis C presents to the ED for evaluation of a mechanical fall sustaining right knee and right hip pain.  Patient reports she was trying to move her outside trash can at home and lost her balance and fell onto her right hip.  Severe pain with ambulation.  Denies head injury or LOC.  No other complaint.     Physical Exam   Triage Vital Signs: ED Triage Vitals  Encounter Vitals Group     BP 05/15/24 1833 109/60     Girls Systolic BP Percentile --      Girls Diastolic BP Percentile --      Boys Systolic BP Percentile --      Boys Diastolic BP Percentile --      Pulse Rate 05/15/24 1833 81     Resp 05/15/24 1833 15     Temp 05/15/24 1833 98.4 F (36.9 C)     Temp Source 05/15/24 1833 Oral     SpO2 05/15/24 1833 96 %     Weight 05/15/24 1831 181 lb (82.1 kg)     Height --      Head Circumference --      Peak Flow --      Pain Score 05/15/24 1831 10     Pain Loc --      Pain Education --      Exclude from Growth Chart --     Most recent vital signs: Vitals:   05/15/24 2013 05/15/24 2018  BP: 102/62   Pulse: 72   Resp: 17   Temp: 98.4 F (36.9 C)   SpO2: 97% 97%    General Awake, no distress.  HEENT NCAT.  CV:  Good peripheral perfusion.  RESP:  Normal effort.  ABD:  No distention.  Other:  No obvious deformity to right knee.  Limited range of motion with knee extension.  Mild tenderness to palpation over patella.  Tenderness to palpation right hip. Unable to ambulate without assistance.    ED Results / Procedures / Treatments   Labs (all labs ordered are listed, but only abnormal results are displayed) Labs Reviewed  CBC - Abnormal; Notable for  the following components:      Result Value   RBC 2.62 (*)    Hemoglobin 9.2 (*)    HCT 29.2 (*)    MCV 111.5 (*)    MCH 35.1 (*)    RDW 16.3 (*)    nRBC 0.3 (*)    All other components within normal limits  BASIC METABOLIC PANEL WITH GFR - Abnormal; Notable for the following components:   Glucose, Bld 107 (*)    BUN 24 (*)    Creatinine, Ser 1.50 (*)    GFR, Estimated 38 (*)    All other components within normal limits  HIV ANTIBODY (ROUTINE TESTING W REFLEX)    RADIOLOGY  I personally viewed and evaluated these images as part of my medical decision making, as well as reviewing the written report by the radiologist.  ED Provider Interpretation: Right hip and knee xray unremarkable   Right CT scan shows nondisplaced fracture of greater trochanteric   CT Hip Right Wo  Contrast Result Date: 05/15/2024 CLINICAL DATA:  Provided history: Hip pain, stress fracture suspected, neg xray Patient reports right hip pain after fall. EXAM: CT OF THE RIGHT HIP WITHOUT CONTRAST TECHNIQUE: Multidetector CT imaging of the right hip was performed according to the standard protocol. Multiplanar CT image reconstructions were also generated. RADIATION DOSE REDUCTION: This exam was performed according to the departmental dose-optimization program which includes automated exposure control, adjustment of the mA and/or kV according to patient size and/or use of iterative reconstruction technique. COMPARISON:  Radiograph earlier today FINDINGS: Bones/Joint/Cartilage Findings suspicious for nondisplaced fracture through the greater trochanter, best appreciated on coronal reformat series 6 images 41-47. no inter trochanteric involvement. Mild hip joint space narrowing with acetabular and femoral head neck osteophytes. Questionable serpiginous subchondral sclerosis which may represent underlying avascular necrosis. No subchondral collapse. The pubic rami are intact. No convincing hip joint effusion. Ligaments  Suboptimally assessed by CT. Muscles and Tendons No intramuscular hematoma. Soft tissues No confluent soft tissue hematoma. Linear calcifications within the subcutaneous tissues laterally likely sequela of remote injury. There are advanced vascular calcifications of the iliac and femoral arteries. IMPRESSION: 1. Findings suspicious for nondisplaced fracture through the greater trochanter. No intertrochanteric involvement. 2. Mild right hip osteoarthritis. Questionable serpiginous subchondral sclerosis which may represent underlying avascular necrosis. No subchondral collapse. 3. Advanced vascular calcifications. Electronically Signed   By: Andrea Gasman M.D.   On: 05/15/2024 21:34   DG Knee Complete 4 Views Right Result Date: 05/15/2024 CLINICAL DATA:  Right knee pain following fall, initial encounter EXAM: RIGHT KNEE - COMPLETE 4+ VIEW COMPARISON:  None Available. FINDINGS: Mild degenerative changes are noted in the medial joint space. No acute fracture or dislocation is noted. No soft tissue abnormality is seen. IMPRESSION: Mild degenerative change without acute abnormality. Electronically Signed   By: Oneil Devonshire M.D.   On: 05/15/2024 19:42   DG Hip Unilat W or Wo Pelvis 2-3 Views Right Result Date: 05/15/2024 CLINICAL DATA:  Recent fall with right hip pain, initial encounter EXAM: DG HIP (WITH OR WITHOUT PELVIS) 3V RIGHT COMPARISON:  None Available. FINDINGS: Pelvic ring is intact. Mild degenerative changes of the hip joints are noted. No acute fracture or dislocation is seen. No soft tissue abnormality is noted. IMPRESSION: No acute abnormality noted. Electronically Signed   By: Oneil Devonshire M.D.   On: 05/15/2024 19:41    PROCEDURES:  Critical Care performed: No  Procedures   MEDICATIONS ORDERED IN ED: Medications  lisinopril -hydrochlorothiazide  (ZESTORETIC ) 20-25 MG per tablet 1 tablet (has no administration in time range)  rosuvastatin  (CRESTOR ) tablet 40 mg (has no administration in  time range)  levothyroxine  (SYNTHROID ) tablet 75 mcg (has no administration in time range)  pantoprazole  (PROTONIX ) EC tablet 40 mg (has no administration in time range)  acetaminophen  (TYLENOL ) tablet 650 mg (has no administration in time range)    Or  acetaminophen  (TYLENOL ) suppository 650 mg (has no administration in time range)  ondansetron  (ZOFRAN ) tablet 4 mg (has no administration in time range)    Or  ondansetron  (ZOFRAN ) injection 4 mg (has no administration in time range)  HYDROcodone -acetaminophen  (NORCO/VICODIN) 5-325 MG per tablet 1-2 tablet (has no administration in time range)  morphine  (PF) 2 MG/ML injection 2 mg (has no administration in time range)  albuterol  (PROVENTIL ) (2.5 MG/3ML) 0.083% nebulizer solution 2.5 mg (has no administration in time range)  oxyCODONE  (Oxy IR/ROXICODONE ) immediate release tablet 5 mg (5 mg Oral Given 05/15/24 2128)     IMPRESSION / MDM /  ASSESSMENT AND PLAN / ED COURSE  I reviewed the triage vital signs and the nursing notes.                              Clinical Course as of 05/15/24 2340  Mon May 15, 2024  2159 CT Hip Right Wo Contrast IMPRESSION: 1. Findings suspicious for nondisplaced fracture through the greater trochanter. No intertrochanteric involvement. 2. Mild right hip osteoarthritis. Questionable serpiginous subchondral sclerosis which may represent underlying avascular necrosis. No subchondral collapse. 3. Advanced vascular calcifications.   [MH]  2339 Consult with Dr. Lorelle. Recommend non urgently in the morning for a consult and review treatment options with the patient. Likely plan for no surgery, Protected weight bearing with assistive devices at all times. No active abduction and will probably order her a hip abduction brace tomorrow [MH]    Clinical Course User Index [MH] Margrette Monte A, PA-C    68 y.o. female presents to the emergency department for evaluation and treatment of right hip pain following fall.  See HPI for further details.   Differential diagnosis includes, but is not limited to fracture, strain, dislocation, contusion  Patient's presentation is most consistent with acute complicated illness / injury requiring diagnostic workup.  Patient is alert and oriented.  She is hemodynamic stable.  Physical exam findings stated above.  Reassuring x-ray however patient is unable to ambulate.  Will obtain CT scan for occult fracture.  CT scan reveals suspicion for nondisplaced fracture of the greater trochanteric.  Consult with orthopedic who will review treatment options with patient in the morning.  Will admit for pain control and observation.  Patient is agreement with this plan.  Patient accepted to hospitalist team.   FINAL CLINICAL IMPRESSION(S) / ED DIAGNOSES   Final diagnoses:  Fall, initial encounter  Closed fracture of right hip, initial encounter [facility])   Rx / DC Orders   ED Discharge Orders     None        Note:  This document was prepared using Dragon voice recognition software and may include unintentional dictation errors.    Margrette, Winter Trefz A, PA-C 05/15/24 2325    Margrette, Jamea Robicheaux A, PA-C 05/15/24 2340    Levander Slate, MD 05/17/24 0010

## 2024-05-15 NOTE — Assessment & Plan Note (Signed)
 Not acutely exacerbated DuoNebs as needed

## 2024-05-15 NOTE — Assessment & Plan Note (Signed)
 Accidental fall Pain control PT OT eval in the a.m. Ortho eval for any additional recommendations Will keep n.p.o. from midnight pending Ortho eval in the a.m.

## 2024-05-15 NOTE — Assessment & Plan Note (Signed)
 At baseline

## 2024-05-15 NOTE — Patient Instructions (Signed)
 VISIT SUMMARY:  Today, you were seen for a sore throat and cough. 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 Your lung examination was normal, and there were no signs of fever. We discussed your symptoms and made a plan to address them.  YOUR PLAN:  -COPD WITH ASTHMA OVERLAP: COPD with asthma overlap means you have chronic obstructive pulmonary disease along with asthma, which affects your breathing. Your current treatment with Advair is working well, and your lung examination was normal. Please continue your nebulizer treatments and follow up in three months unless your symptoms worsen.   -ACUTE PHARYNGITIS: Acute pharyngitis is an inflammation of the throat that causes soreness. Your sore throat may be due to recent exposure to germs. There is no fever, and your COVID-19 test was negative. Please monitor your symptoms and let us  know if your cough worsens or changes.  INSTRUCTIONS:  Please follow up in three months for your COPD and asthma overlap management unless your symptoms worsen. Monitor your sore throat and cough, and report any changes or worsening of symptoms.

## 2024-05-15 NOTE — Assessment & Plan Note (Signed)
-   Continue home lisinopril-HCTZ

## 2024-05-15 NOTE — ED Triage Notes (Signed)
 Pt arrives after having a fall today. Pt reports losing her balance and falling while trying move her trashcan. Pt reports right hip and knee pain. Pt denies hitting her head or neck pain. Per pt, pain is worse when bearing weight.

## 2024-05-15 NOTE — H&P (Signed)
 History and Physical    Patient: Tina Page FMW:[id number] DOB: Aug 29, 1956 DOA: 05/15/2024 DOS: the patient was seen and examined on 05/15/2024 PCP: Adina Buel HERO, MD  Patient coming from: Home  Chief Complaint:  Chief Complaint  Patient presents with   Fall    HPI: Tina Page is a 68 y.o. 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  She was previously in her usual state of health and denies preceding lightheadedness, chest pain, palpitations, shortness of breath, headache, visual disturbance, one-sided weakness numbness or tingling. In the ED vitals within normal limits Labs notable for baseline hemoglobin of 9.2 and baseline creatinine of 1.5 EKG with NSR at 76 CT hip showed findings suspicious for nondisplaced fracture through the greater trochanter without intertrochanteric involvement. The ED provider spoke with orthopedist Dr. Rolla who advised likely nonoperative but will see patient in the a.m. Patient treated with oxycodone  for pain Admission requested for pain control      Review of Systems: As mentioned in the history of present illness. All other systems reviewed and are negative.  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   Post PTCA 07/12/2016   iliac stents bilaterally 2017   Thrombocytopenia (HCC) 10/04/2016   Wears dentures    full upper and lower   Past Surgical History:  Procedure Laterality Date   ELECTROMAGNETIC NAVIGATION BROCHOSCOPY N/A 12/07/2017   Procedure: ELECTROMAGNETIC NAVIGATION BRONCHOSCOPY;  Surgeon: Isaiah Scrivener, MD;  Location: [facility] ORS;  Service: Cardiopulmonary;  Laterality: N/A;   ESOPHAGOGASTRODUODENOSCOPY (EGD) WITH PROPOFOL  N/A 07/08/2016   Procedure: ESOPHAGOGASTRODUODENOSCOPY (EGD) WITH PROPOFOL ;  Surgeon: Lamar ONEIDA Holmes, MD;  Location: [facility] ENDOSCOPY;  Service: Endoscopy;  Laterality: N/A;   KNEE SURGERY Right    repair of acl tear   PERIPHERAL VASCULAR CATHETERIZATION N/A 07/10/2016   Procedure: Lower Extremity Angiography;  Surgeon: Cordella KANDICE Shawl, MD;  Location: [facility] INVASIVE CV LAB;  Service: Cardiovascular;  Laterality: N/A;   PERIPHERAL VASCULAR CATHETERIZATION N/A 07/10/2016   Procedure: Abdominal Aortogram w/Lower Extremity;  Surgeon: Cordella KANDICE Shawl, MD;  Location: [facility] INVASIVE CV LAB;  Service: Cardiovascular;  Laterality: N/A;   PERIPHERAL VASCULAR CATHETERIZATION  07/10/2016   Procedure: Lower Extremity Intervention;  Surgeon: Cordella KANDICE Shawl, MD;  Location: [facility] INVASIVE CV LAB;  Service: Cardiovascular;;   Social History:  reports that she quit smoking about 7 years ago. Her smoking use included cigarettes. She started smoking about 54 years ago. She has a 58.8 pack-year smoking history. She has never used smokeless tobacco. She reports that she does not drink alcohol and does not use drugs.  Allergies  Allergen Reactions   Ciprofloxacin  Swelling    Facial swelling following single oral dose.  Codeine Anaphylaxis   Nsaids Other (See Comments)    Patient has Hep C.     Family History  Problem Relation Age of Onset   Diabetes Mother    Hypertension Mother    Diabetes Maternal  Grandfather    Hypertension Maternal Grandfather    Breast cancer Neg Hx     Prior to Admission medications   Medication Sig Start Date End Date Taking? Authorizing Provider  albuterol  (VENTOLIN  HFA) 108 (90 Base) MCG/ACT inhaler SMARTSIG:2 Puff(s) By Mouth Every 4 Hours PRN 12/30/21   [provider]  allopurinol  (ZYLOPRIM ) 100 MG tablet TAKE 1 TABLET BY MOUTH EVERY DAY 06/02/19   Corcoran, Melissa C, MD  budesonide -glycopyrrolate -formoterol  (BREZTRI  AEROSPHERE) 160-9-4.8 MCG/ACT AERO inhaler Inhale 2 puffs into the lungs in the morning and at bedtime. 03/27/24   Tamea Dedra CROME, MD  CALCIUM  PO Take by mouth daily at 12 noon.    [provider]  clopidogrel  (PLAVIX ) 75 MG tablet Take 1 tablet (75 mg total) by mouth daily. 07/13/16   Vaickute, Rima, MD  cyanocobalamin  500 MCG tablet Take 500 mcg by mouth daily.    [provider]  Ensifentrine  (OHTUVAYRE ) 3 MG/2.5ML SUSP Take 3 mg by nebulization 2 (two) times daily. 05/26/23   Tamea Dedra CROME, MD  hydroxychloroquine  (PLAQUENIL ) 200 MG tablet Take 200 mg by mouth 2 (two) times daily.    [provider]  levothyroxine  (SYNTHROID , LEVOTHROID) 75 MCG tablet Take 75 mcg by mouth daily before breakfast.  12/30/17   [provider]  lisinopril -hydrochlorothiazide  (ZESTORETIC ) 20-25 MG tablet Take 1 tablet by mouth once daily 10/12/23   Gerard Frederick, NP  pantoprazole  (PROTONIX ) 40 MG tablet Take 1 tablet by mouth once daily 01/17/24   Tamea Dedra CROME, MD  rosuvastatin  (CRESTOR ) 40 MG tablet Take 1 tablet (40 mg total) by mouth daily. 08/25/22 12/15/24  Gerard Frederick, NP  VITAMIN D  PO Take by mouth daily in the afternoon.    [provider]    Physical Exam: Vitals:   05/15/24 1833 05/15/24 2010 05/15/24 2013 05/15/24 2018  BP: 109/60 102/62 102/62   Pulse: 81 80 72   Resp: 15 16 17    Temp: 98.4 F (36.9 C) 98.4 F (36.9 C) 98.4 F (36.9 C)   TempSrc: Oral Oral Oral   SpO2: 96% 98% 97%  97%  Weight:       Physical Exam Vitals and nursing note reviewed.  Constitutional:      General: She is not in acute distress. HENT:     Head: Normocephalic and atraumatic.  Cardiovascular:     Rate and Rhythm: Normal rate and regular rhythm.     Heart sounds: Normal heart sounds.  Pulmonary:     Effort: Pulmonary effort is normal.     Breath sounds: Normal breath sounds.  Abdominal:     Palpations: Abdomen is soft.     Tenderness: There is no abdominal tenderness.  Musculoskeletal:     Comments: Pain lateral right hip  Neurological:     Mental Status: Mental status is at baseline.     Labs on Admission: I have personally reviewed following labs and imaging studies  CBC: Recent Labs  Lab 05/12/24 1053 05/15/24 2234  WBC 4.0 7.5  NEUTROABS 2.6  --   HGB 9.1* 9.2*  HCT 28.4* 29.2*  MCV 110.1* 111.5*  PLT 166 183   Basic Metabolic Panel: Recent Labs  Lab 05/12/24 1054 05/15/24 2234  NA 137 141  K 3.9  3.7  CL 105 101  CO2 26 29  GLUCOSE 107* 107*  BUN 20 24*  CREATININE 1.26* 1.50*  CALCIUM  9.2 9.7   GFR: Estimated Creatinine Clearance: 37.4 mL/min (A) (by C-G formula based on SCr of 1.5 mg/dL (H)). Liver Function Tests: Recent Labs  Lab 05/12/24 1054  AST 22  ALT 11  ALKPHOS 72  BILITOT 0.8  PROT 7.0  ALBUMIN 4.0   No results for input(s): LIPASE, AMYLASE in the last 168 hours. No results for input(s): AMMONIA in the last 168 hours. Coagulation Profile: No results for input(s): INR, PROTIME in the last 168 hours. Cardiac Enzymes: No results for input(s): CKTOTAL, CKMB, CKMBINDEX, TROPONINI in the last 168 hours. BNP (last 3 results) No results for input(s): PROBNP in the last 8760 hours. HbA1C: No results for input(s): HGBA1C in the last 72 hours. CBG: No results for input(s): GLUCAP in the last 168 hours. Lipid Profile: No results for input(s): CHOL, HDL, LDLCALC, TRIG, CHOLHDL, LDLDIRECT in the last 72  hours. Thyroid Function Tests: No results for input(s): TSH, T4TOTAL, FREET4, T3FREE, THYROIDAB in the last 72 hours. Anemia Panel: No results for input(s): VITAMINB12, FOLATE, FERRITIN, TIBC, IRON , RETICCTPCT in the last 72 hours. Urine analysis:    Component Value Date/Time   COLORURINE YELLOW (A) 01/09/2023 1931   APPEARANCEUR CLEAR (A) 01/09/2023 1931   LABSPEC >1.030 (H) 01/09/2023 1931   PHURINE 5.5 01/09/2023 1931   GLUCOSEU NEGATIVE 01/09/2023 1931   HGBUR NEGATIVE 01/09/2023 1931   BILIRUBINUR NEGATIVE 01/09/2023 1931   KETONESUR NEGATIVE 01/09/2023 1931   PROTEINUR NEGATIVE 01/09/2023 1931   NITRITE NEGATIVE 01/09/2023 1931   LEUKOCYTESUR TRACE (A) 01/09/2023 1931    Radiological Exams on Admission: CT Hip Right Wo Contrast Result Date: 05/15/2024 CLINICAL DATA:  Provided history: Hip pain, stress fracture suspected, neg xray Patient reports right hip pain after fall. EXAM: CT OF THE RIGHT HIP WITHOUT CONTRAST TECHNIQUE: Multidetector CT imaging of the right hip was performed according to the standard protocol. Multiplanar CT image reconstructions were also generated. RADIATION DOSE REDUCTION: This exam was performed according to the departmental dose-optimization program which includes automated exposure control, adjustment of the mA and/or kV according to patient size and/or use of iterative reconstruction technique. COMPARISON:  Radiograph earlier today FINDINGS: Bones/Joint/Cartilage Findings suspicious for nondisplaced fracture through the greater trochanter, best appreciated on coronal reformat series 6 images 41-47. no inter trochanteric involvement. Mild hip joint space narrowing with acetabular and femoral head neck osteophytes. Questionable serpiginous subchondral sclerosis which may represent underlying avascular necrosis. No subchondral collapse. The pubic rami are intact. No convincing hip joint effusion. Ligaments Suboptimally assessed by CT.  Muscles and Tendons No intramuscular hematoma. Soft tissues No confluent soft tissue hematoma. Linear calcifications within the subcutaneous tissues laterally likely sequela of remote injury. There are advanced vascular calcifications of the iliac and femoral arteries. IMPRESSION: 1. Findings suspicious for nondisplaced fracture through the greater trochanter. No intertrochanteric involvement. 2. Mild right hip osteoarthritis. Questionable serpiginous subchondral sclerosis which may represent underlying avascular necrosis. No subchondral collapse. 3. Advanced vascular calcifications. Electronically Signed   By: Andrea Gasman M.D.   On: 05/15/2024 21:34   DG Knee Complete 4 Views Right Result Date: 05/15/2024 CLINICAL DATA:  Right knee pain following fall, initial encounter EXAM: RIGHT KNEE - COMPLETE 4+ VIEW COMPARISON:  None Available. FINDINGS: Mild degenerative changes are noted in the medial joint space. No acute fracture or dislocation is noted. No soft tissue abnormality is seen. IMPRESSION: Mild  degenerative change without acute abnormality. Electronically Signed   By: Oneil Devonshire M.D.   On: 05/15/2024 19:42   DG Hip Unilat W or Wo Pelvis 2-3 Views Right Result Date: 05/15/2024 CLINICAL DATA:  Recent fall with right hip pain, initial encounter EXAM: DG HIP (WITH OR WITHOUT PELVIS) 3V RIGHT COMPARISON:  None Available. FINDINGS: Pelvic ring is intact. Mild degenerative changes of the hip joints are noted. No acute fracture or dislocation is seen. No soft tissue abnormality is noted. IMPRESSION: No acute abnormality noted. Electronically Signed   By: Oneil Devonshire M.D.   On: 05/15/2024 19:41   Data Reviewed for HPI: Relevant notes from primary care and specialist visits, past discharge summaries as available in EHR, including Care Everywhere. Prior diagnostic testing as pertinent to current admission diagnoses Updated medications and problem lists for reconciliation ED course, including vitals,  labs, imaging, treatment and response to treatment Triage notes, nursing and pharmacy notes and ED provider's notes Notable results as noted above in HPI      Assessment and Plan: Nondisplaced fracture of greater trochanter of right femur, initial encounter for closed fracture (HCC) Accidental fall Pain control PT OT eval in the a.m. Ortho eval for any additional recommendations Will keep n.p.o. from midnight pending Ortho eval in the a.m.  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 Observation status is judged to be reasonable and necessary in order to provide the required intensity of service to ensure the patient's safety. The patient's presenting symptoms, physical exam findings, and initial radiographic and laboratory data in the context of their medical condition is felt to place them at decreased risk for further clinical deterioration. Furthermore, it is anticipated that the patient will be medically stable for discharge from the hospital within 2 midnights of admission.   Author: Delayne LULLA Solian, MD 05/15/2024 11:13 PM  For on call review [email or web address].

## 2024-05-16 ENCOUNTER — Inpatient Hospital Stay

## 2024-05-16 DIAGNOSIS — S72101G Unspecified trochanteric fracture of right femur, subsequent encounter for closed fracture with delayed healing: Secondary | ICD-10-CM | POA: Diagnosis not present

## 2024-05-16 LAB — HIV ANTIBODY (ROUTINE TESTING W REFLEX): HIV Screen 4th Generation wRfx: NONREACTIVE

## 2024-05-16 MED ORDER — INFLUENZA VAC SPLIT HIGH-DOSE 0.5 ML IM SUSY
0.5000 mL | PREFILLED_SYRINGE | INTRAMUSCULAR | Status: DC
Start: 2024-05-17 — End: 2024-05-23
  Filled 2024-05-16: qty 0.5

## 2024-05-16 MED ORDER — ENOXAPARIN SODIUM 40 MG/0.4ML IJ SOSY
40.0000 mg | PREFILLED_SYRINGE | INTRAMUSCULAR | Status: DC
  Administered 2024-05-16: 40 mg via SUBCUTANEOUS
  Filled 2024-05-16: qty 0.4

## 2024-05-16 NOTE — Progress Notes (Signed)
  PROGRESS NOTE    Tina Page  FMW:[id number] DOB: 10-26-1955 DOA: 05/15/2024 PCP: Tina Buel HERO, MD  [id number]-AA  LOS: 0 days   Brief hospital course:   Assessment & Plan: Tina COLOMBE is a 68 y.o. 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  Had BM.   Objective: Vitals:   05/16/24 0254 05/16/24 0500 05/16/24 0728 05/16/24 1431  BP: (!) 104/43  (!) 104/39 (!) 157/56  Pulse: 74  69 94  Resp: 17  15   Temp: 98.3 F (36.8 C)  98.3 F (36.8 C)   TempSrc:   Oral   SpO2:  96%  100% 98%  Weight:  82.1 kg    Height:  5' 3 (1.6 m)     No intake or output data in the 24 hours ending 05/16/24 1951 Filed Weights   05/15/24 1831 05/16/24 0500  Weight: 82.1 kg 82.1 kg    Examination:   Constitutional: NAD, AAOx3 HEENT: conjunctivae and lids normal, EOMI CV: No cyanosis.   RESP: normal respiratory effort, on 2L Neuro: II - XII grossly intact.   Psych: Normal mood and affect.  Appropriate judgement and reason   Data Reviewed: I have personally reviewed labs and imaging studies  Time spent: 35 minutes  Tina Haber, MD [facility] If 7PM-7AM, please contact night-coverage 05/16/2024, 7:51 PM

## 2024-05-16 NOTE — Evaluation (Addendum)
 Occupational Therapy Evaluation Patient Details Name: Tina Page MRN: [id number] DOB: 03-13-1956 Today's Date: 05/16/2024   History of Present Illness   Pt is 68 year old female presented to ED after fall admitted with Right greater trochanter avulsion fracture with plans to manage non operatively with hip abduction brace at this time     PMH significant for HTN, COPD, thrombocytopenia, hypothyroidism, HLD, anemia and hepatitis C     Clinical Impressions Chart reviewed to date, pt greeted in bed, agreeable to OT evaluation. She is alert and oriented x4. PTA pt is indep in ADL/IADL. She lives with her sister (who is a quadriplegic per her report and has nursing care) and her mom. She amb with no AD. Pt presents with deficits in strength, endurance, activity tolerance, balance, affecting safe and optimal ADL completion. She requires MAX A +2 for rolling to donn hip abduction brace with MAX A. MAX A +2 for bed mobility, fair, progress to good static sitting balance, MOD A +2 for STS attempts, MOD A +2 for attempts at amb approx 2' forward and back. Generally appropriate adherence to Wbing precautions, will continue to assess. Pt is generally pain limited despite being pre medicated. SET up required for grooming/feeding tasks in sitting. Educated Sports coach re; role of OT, role of rehab, discharge recommendations, Wbing precautions and use/fit of brace, importance of continued mobility attempts. All questions answered within scope. Pt is performing ADL/functional mobility below PLOF, will benefit from acute OT to address deficits and to facilitate optimal ADL/functional mobility performance. Pt is left in bed, all needs met. OT will continue to follow     If plan is discharge home, recommend the following:   A lot of help with walking and/or transfers;A lot of help with bathing/dressing/bathroom     Functional Status Assessment   Patient has had a recent decline in their functional status  and demonstrates the ability to make significant improvements in function in a reasonable and predictable amount of time.     Equipment Recommendations   BSC/3in1;Other (comment) (2WW)     Recommendations for Other Services         Precautions/Restrictions   Precautions Precautions: Fall Recall of Precautions/Restrictions: Intact Required Braces or Orthoses: Other Brace (hip abduction brace) Other Brace: hip abduction brace Restrictions Weight Bearing Restrictions Per Provider Order: Yes RLE Weight Bearing Per Provider Order: Partial weight bearing RLE Partial Weight Bearing Percentage or Pounds: 50%     Mobility Bed Mobility Overal bed mobility: Needs Assistance Bed Mobility: Supine to Sit, Sit to Supine, Rolling Rolling: Max assist, Used rails, +2 for physical assistance   Supine to sit: Max assist, +2 for physical assistance, +2 for safety/equipment Sit to supine: Max assist, +2 for physical assistance, +2 for safety/equipment   General bed mobility comments: pt is able to assist with BUE, generally limited by pain with mobility    Transfers Overall transfer level: Needs assistance Equipment used: Rolling walker (2 wheels) Transfers: Sit to/from Stand Sit to Stand: Mod assist (+2)           General transfer comment: attempts for steps up the bed with RW with MOD A, steps forward and back approx 2' with MOD A +2; frequent multi modal cues for technique and PWBing      Balance Overall balance assessment: Needs assistance Sitting-balance support: Feet supported Sitting balance-Leahy Scale: Good     Standing balance support: Bilateral upper extremity supported, Reliant on assistive device for balance, During functional activity Standing balance-Leahy  Scale: Poor                             ADL either performed or assessed with clinical judgement   ADL Overall ADL's : Needs assistance/impaired Eating/Feeding: Set up;Bed level   Grooming:  Set up               Lower Body Dressing: Maximal assistance;Bed level Lower Body Dressing Details (indicate cue type and reason): donn hip abduction brace in supine     Toileting- Clothing Manipulation and Hygiene: Maximal assistance;Bed level               Vision Patient Visual Report: No change from baseline       Perception         Praxis         Pertinent Vitals/Pain Pain Assessment Pain Assessment: 0-10 Pain Score: 10-Worst pain ever Pain Location: R hip with mobility Pain Descriptors / Indicators: Grimacing, Crying Pain Intervention(s): Monitored during session, Premedicated before session, Limited activity within patient's tolerance     Extremity/Trunk Assessment Upper Extremity Assessment Upper Extremity Assessment: Overall WFL for tasks assessed   Lower Extremity Assessment Lower Extremity Assessment: Difficult to assess due to impaired cognition;Defer to PT evaluation       Communication Communication Communication: No apparent difficulties   Cognition Arousal: Alert Behavior During Therapy: WFL for tasks assessed/performed Cognition: No apparent impairments                               Following commands: Intact       Cueing  General Comments   Cueing Techniques: Verbal cues  vss throughout   Exercises Other Exercises Other Exercises: edu pt/daugther Brandi re role of OT, role of rehab, discharge recommendations, importance of continued attempts at mobility with hip abduction brace   Shoulder Instructions      Home Living Family/patient expects to be discharged to:: Private residence Living Arrangements: Parent;Other relatives (sister) Available Help at Discharge: Family;Available PRN/intermittently Type of Home: House Home Access: Stairs to enter           Bathroom Shower/Tub: Chief Strategy Officer: Standard     Home Equipment: None          Prior Functioning/Environment Prior Level  of Function : Independent/Modified Independent                    OT Problem List: Decreased strength;Decreased activity tolerance;Decreased knowledge of use of DME or AE;Impaired balance (sitting and/or standing)   OT Treatment/Interventions: Self-care/ADL training;Energy conservation;Balance training;Therapeutic exercise;DME and/or AE instruction;Therapeutic activities;Patient/family education      OT Goals(Current goals can be found in the care plan section)   Acute Rehab OT Goals Patient Stated Goal: improve function OT Goal Formulation: With patient Time For Goal Achievement: 05/30/24 Potential to Achieve Goals: Good ADL Goals Pt Will Perform Grooming: with modified independence;sitting Pt Will Perform Lower Body Dressing: with modified independence;sitting/lateral leans;sit to/from stand Pt Will Transfer to Toilet: with modified independence;ambulating Pt Will Perform Toileting - Clothing Manipulation and hygiene: with modified independence;sitting/lateral leans;sit to/from stand   OT Frequency:  Min 2X/week    Co-evaluation              AM-PAC OT 6 Clicks Daily Activity     Outcome Measure Help from another person eating meals?: None Help from another person taking care  of personal grooming?: None Help from another person toileting, which includes using toliet, bedpan, or urinal?: A Lot Help from another person bathing (including washing, rinsing, drying)?: A Lot Help from another person to put on and taking off regular upper body clothing?: None Help from another person to put on and taking off regular lower body clothing?: A Lot 6 Click Score: 18   End of Session Equipment Utilized During Treatment: Rolling walker (2 wheels) Nurse Communication: Mobility status  Activity Tolerance: Patient limited by pain Patient left: in bed;with call bell/phone within reach;with bed alarm set  OT Visit Diagnosis: Other abnormalities of gait and mobility  (R26.89);Muscle weakness (generalized) (M62.81)                Time: 8585-8548 OT Time Calculation (min): 37 min Charges:  OT General Charges $OT Visit: 1 Visit OT Evaluation $OT Eval Moderate Complexity: 1 Mod Therisa Sheffield, OTD OTR/L  05/16/24, 4:12 PM

## 2024-05-16 NOTE — Plan of Care (Signed)
   Problem: Clinical Measurements: Goal: Ability to maintain clinical measurements within normal limits will improve Outcome: Progressing

## 2024-05-16 NOTE — ED Notes (Signed)
 Patient's O2 dropped after administration of IV morphine . Placed on 2L [place] with improvement. Patient easily arousable, alert, and oriented x4. Purewick in place in case of patient needing to pee due to hip fracture.

## 2024-05-16 NOTE — Consult Note (Signed)
 ORTHOPAEDIC CONSULTATION  REQUESTING PHYSICIAN: Awanda City, MD  Chief Complaint:   Right greater trochanter fracture  History of Present Illness: Tina Page is a 68 y.o. female with a history of HTN, COPD, thrombocytopenia, hypothyroidism, HLD, anemia and hepatitis C who presented to the emergency after mechanical fall.  She reports she was moving a trash can and tripped and lost her balance falling onto her right side.  She denies head strike or loss of consciousness.  She reports pain over the lateral hip and difficulty with ambulation and was admitted to the hospital for inability to ambulate.  Denies any pre-existing hip pain prior to the fall.  Denies any numbness or tingling or any shortness of breath or chest pain at this time.  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/23/2017   Post PTCA 07/12/2016   iliac stents bilaterally 2017   Thrombocytopenia (HCC) 10/04/2016   Wears dentures    full upper and lower   Past Surgical History:  Procedure Laterality Date   ELECTROMAGNETIC NAVIGATION BROCHOSCOPY N/A 12/07/2017   Procedure: ELECTROMAGNETIC NAVIGATION BRONCHOSCOPY;  Surgeon: Isaiah Scrivener, MD;  Location: [facility] ORS;  Service: Cardiopulmonary;  Laterality: N/A;   ESOPHAGOGASTRODUODENOSCOPY (EGD) WITH PROPOFOL  N/A 07/08/2016   Procedure: ESOPHAGOGASTRODUODENOSCOPY (EGD) WITH PROPOFOL ;  Surgeon: Lamar ONEIDA Holmes, MD;  Location: [facility]  ENDOSCOPY;  Service: Endoscopy;  Laterality: N/A;   KNEE SURGERY Right    repair of acl tear   PERIPHERAL VASCULAR CATHETERIZATION N/A 07/10/2016   Procedure: Lower Extremity Angiography;  Surgeon: Cordella KANDICE Shawl, MD;  Location: [facility] INVASIVE [facility];  Service: Cardiovascular;  Laterality: N/A;   PERIPHERAL VASCULAR CATHETERIZATION N/A 07/10/2016   Procedure: Abdominal Aortogram w/Lower Extremity;  Surgeon: Cordella KANDICE Shawl, MD;  Location: [facility];  Service: Cardiovascular;  Laterality: N/A;   PERIPHERAL VASCULAR CATHETERIZATION  07/10/2016   Procedure: Lower Extremity Intervention;  Surgeon: Cordella KANDICE Shawl, MD;  Location: [facility];  Service: Cardiovascular;;   Social History   Socioeconomic History   Marital status: Widowed    Spouse name: Not on file   Number of children: Not on file   Years of education: Not on file   Highest education level: Not on file  Occupational History   Not on file  Tobacco Use   Smoking status: Former    Current packs/day: 0.00    Average packs/day: 1.3 packs/day for 47.0 years (58.8 ttl pk-yrs)    Types: Cigarettes    Start date: 07/11/1969    Quit date: 07/11/2016    Years since quitting: 7.8   Smokeless tobacco: Never  Vaping Use   Vaping status: Never Used  Substance and Sexual Activity   Alcohol use: No   Drug use: No   Sexual activity: Not Currently    Birth control/protection: None  Other Topics Concern   Not on file  Social History Narrative   Not on file   Social Drivers of Health   Financial Resource Strain: Not  on file  Food Insecurity: No Food Insecurity (05/16/2024)   Hunger Vital Sign    Worried About Running Out of Food in the Last Year: Never true    Ran Out of Food in the Last Year: Never true  Transportation Needs: No Transportation Needs (05/16/2024)   PRAPARE - Administrator, Civil Service (Medical): No    Lack of Transportation (Non-Medical): No  Physical Activity: Not on  file  Stress: Not on file  Social Connections: Unknown (05/16/2024)   Social Connection and Isolation Panel    Frequency of Communication with Friends and Family: More than three times a week    Frequency of Social Gatherings with Friends and Family: More than three times a week    Attends Religious Services: Not on Marketing executive or Organizations: Not on file    Attends Banker Meetings: Not on file    Marital Status: Not on file   Family History  Problem Relation Age of Onset   Diabetes Mother    Hypertension Mother    Diabetes Maternal Grandfather    Hypertension Maternal Grandfather    Breast cancer Neg Hx    Allergies  Allergen Reactions   Ciprofloxacin  Swelling    Facial swelling following single oral dose.    Codeine Anaphylaxis   Nsaids Other (See Comments)    Patient has Hep C.    Prior to Admission medications   Medication Sig Start Date End Date Taking? Authorizing Provider  albuterol  (VENTOLIN  HFA) 108 (90 Base) MCG/ACT inhaler SMARTSIG:2 Puff(s) By Mouth Every 4 Hours PRN 12/30/21  Yes [provider]  allopurinol  (ZYLOPRIM ) 100 MG tablet TAKE 1 TABLET BY MOUTH EVERY DAY 06/02/19  Yes Corcoran, Melissa C, MD  budesonide -glycopyrrolate -formoterol  (BREZTRI  AEROSPHERE) 160-9-4.8 MCG/ACT AERO inhaler Inhale 2 puffs into the lungs in the morning and at bedtime. 03/27/24  Yes Tamea Dedra CROME, MD  CALCIUM  PO Take by mouth daily at 12 noon.   Yes [provider]  clopidogrel  (PLAVIX ) 75 MG tablet Take 1 tablet (75 mg total) by mouth daily. 07/13/16  Yes Vaickute, Rima, MD  cyanocobalamin  500 MCG tablet Take 500 mcg by mouth daily.   Yes [provider]  Ensifentrine  (OHTUVAYRE ) 3 MG/2.5ML SUSP Take 3 mg by nebulization 2 (two) times daily. 05/26/23  Yes Tamea Dedra CROME, MD  hydroxychloroquine  (PLAQUENIL ) 200 MG tablet Take 200 mg by mouth 2 (two) times daily.   Yes [provider]  levothyroxine  (SYNTHROID ,  LEVOTHROID) 75 MCG tablet Take 75 mcg by mouth daily before breakfast.  12/30/17  Yes [provider]  lisinopril -hydrochlorothiazide  (ZESTORETIC ) 20-25 MG tablet Take 1 tablet by mouth once daily 10/12/23  Yes Hammock, Sheri, NP  pantoprazole  (PROTONIX ) 40 MG tablet Take 1 tablet by mouth once daily 01/17/24  Yes Tamea Dedra CROME, MD  rosuvastatin  (CRESTOR ) 40 MG tablet Take 1 tablet (40 mg total) by mouth daily. 08/25/22 12/15/24 Yes Hammock, Tylene, NP  VITAMIN D  PO Take by mouth daily in the afternoon.   Yes [provider]   CT Hip Right Wo Contrast Result Date: 05/15/2024 CLINICAL DATA:  Provided history: Hip pain, stress fracture suspected, neg xray Patient reports right hip pain after fall. EXAM: CT OF THE RIGHT HIP WITHOUT CONTRAST TECHNIQUE: Multidetector CT imaging of the right hip was performed according to the standard protocol. Multiplanar CT image reconstructions were also generated. RADIATION DOSE REDUCTION: This exam was performed according to the departmental dose-optimization program  which includes automated exposure control, adjustment of the mA and/or kV according to patient size and/or use of iterative reconstruction technique. COMPARISON:  Radiograph earlier today FINDINGS: Bones/Joint/Cartilage Findings suspicious for nondisplaced fracture through the greater trochanter, best appreciated on coronal reformat series 6 images 41-47. no inter trochanteric involvement. Mild hip joint space narrowing with acetabular and femoral head neck osteophytes. Questionable serpiginous subchondral sclerosis which may represent underlying avascular necrosis. No subchondral collapse. The pubic rami are intact. No convincing hip joint effusion. Ligaments Suboptimally assessed by CT. Muscles and Tendons No intramuscular hematoma. Soft tissues No confluent soft tissue hematoma. Linear calcifications within the subcutaneous tissues laterally likely sequela of remote injury. There are advanced  vascular calcifications of the iliac and femoral arteries. IMPRESSION: 1. Findings suspicious for nondisplaced fracture through the greater trochanter. No intertrochanteric involvement. 2. Mild right hip osteoarthritis. Questionable serpiginous subchondral sclerosis which may represent underlying avascular necrosis. No subchondral collapse. 3. Advanced vascular calcifications. Electronically Signed   By: Andrea Gasman M.D.   On: 05/15/2024 21:34   DG Knee Complete 4 Views Right Result Date: 05/15/2024 CLINICAL DATA:  Right knee pain following fall, initial encounter EXAM: RIGHT KNEE - COMPLETE 4+ VIEW COMPARISON:  None Available. FINDINGS: Mild degenerative changes are noted in the medial joint space. No acute fracture or dislocation is noted. No soft tissue abnormality is seen. IMPRESSION: Mild degenerative change without acute abnormality. Electronically Signed   By: Oneil Devonshire M.D.   On: 05/15/2024 19:42   DG Hip Unilat W or Wo Pelvis 2-3 Views Right Result Date: 05/15/2024 CLINICAL DATA:  Recent fall with right hip pain, initial encounter EXAM: DG HIP (WITH OR WITHOUT PELVIS) 3V RIGHT COMPARISON:  None Available. FINDINGS: Pelvic ring is intact. Mild degenerative changes of the hip joints are noted. No acute fracture or dislocation is seen. No soft tissue abnormality is noted. IMPRESSION: No acute abnormality noted. Electronically Signed   By: Oneil Devonshire M.D.   On: 05/15/2024 19:41    Positive ROS: All other systems have been reviewed and were otherwise negative with the exception of those mentioned in the HPI and as above.  Physical Exam: General:  Alert, no acute distress Psychiatric:  Patient is competent for consent with normal mood and affect   Cardiovascular:  No pedal edema Respiratory:  No wheezing, non-labored breathing GI:  Abdomen is soft and non-tender Skin:  No lesions in the area of chief complaint Neurologic:  Sensation intact distally Lymphatic:  No axillary or cervical  lymphadenopathy  Orthopedic Exam:  Right lower extremity No pain with gentle logroll No pain with distracted axial load Tenderness localized to the groin and thigh with hip flexion and axial load Tender to palpation over the lateral hip with some mild fullness over the lateral hip No tenderness of the distal femur, knee, ankle foot or toes Neurovascular intact distally able to dorsiflex and plantarflex the foot and toes. Compartments all soft  Secondary survey No tenderness to palpation over other bony prominences in the lower extremities or bilateral upper extremities No pain with logroll or simulated axial loading of the left lower extremity All compartments soft No tenderness to palpation over the cervical or thoracic spine, no bony step-off Motor grossly intact throughout, no focal deficits Sensation grossly intact throughout, no focal deficits Good distal pulses and capillary refill on all extremities   X-rays:  X-rays and CT scan reviewed which show a small avulsion style fracture off the posterior tip of the greater trochanter.  No evidence  of fracture extension into the intertrochanteric region.  Assessment: Right greater trochanter avulsion fracture  Plan: Clinical and radiographic findings were reviewed with the patient.  There is no evidence at this time of extension of the fracture into the intertrochanteric region however this could still be present given the limitations of CT scans.  I reviewed all this with the patient and discussed the potential risk for fracture propagation or displacement.  Given her exam I have low clinical concern for significant extension into the intertrochanteric region.  We discussed the risk of falling and the potential need for surgical intervention should she have a displaced fracture.  At this time we will continue with a conservative course of management with 50% protected weightbearing with assistive devices at all times and I have ordered  her a hip abduction brace which should be delivered by [facility].  Patient agrees with this above plan will have her follow-up next week for repeat x-rays in the office all questions answered.    Arthea Sheer MD  Beeper #:  [id number]  05/16/2024 1:32 PM

## 2024-05-17 DIAGNOSIS — S72101G Unspecified trochanteric fracture of right femur, subsequent encounter for closed fracture with delayed healing: Secondary | ICD-10-CM | POA: Diagnosis not present

## 2024-05-17 DIAGNOSIS — E860 Dehydration: Secondary | ICD-10-CM | POA: Diagnosis present

## 2024-05-17 DIAGNOSIS — Z87891 Personal history of nicotine dependence: Secondary | ICD-10-CM | POA: Diagnosis not present

## 2024-05-17 DIAGNOSIS — Z1152 Encounter for screening for COVID-19: Secondary | ICD-10-CM | POA: Diagnosis not present

## 2024-05-17 DIAGNOSIS — Z79890 Hormone replacement therapy (postmenopausal): Secondary | ICD-10-CM | POA: Diagnosis not present

## 2024-05-17 DIAGNOSIS — Z7902 Long term (current) use of antithrombotics/antiplatelets: Secondary | ICD-10-CM | POA: Diagnosis not present

## 2024-05-17 DIAGNOSIS — M25551 Pain in right hip: Secondary | ICD-10-CM | POA: Diagnosis present

## 2024-05-17 DIAGNOSIS — D5912 Cold autoimmune hemolytic anemia: Secondary | ICD-10-CM | POA: Diagnosis present

## 2024-05-17 DIAGNOSIS — M109 Gout, unspecified: Secondary | ICD-10-CM | POA: Diagnosis present

## 2024-05-17 DIAGNOSIS — N1831 Chronic kidney disease, stage 3a: Secondary | ICD-10-CM | POA: Diagnosis present

## 2024-05-17 DIAGNOSIS — W010XXA Fall on same level from slipping, tripping and stumbling without subsequent striking against object, initial encounter: Secondary | ICD-10-CM | POA: Diagnosis present

## 2024-05-17 DIAGNOSIS — I129 Hypertensive chronic kidney disease with stage 1 through stage 4 chronic kidney disease, or unspecified chronic kidney disease: Secondary | ICD-10-CM | POA: Diagnosis present

## 2024-05-17 DIAGNOSIS — D696 Thrombocytopenia, unspecified: Secondary | ICD-10-CM | POA: Diagnosis present

## 2024-05-17 DIAGNOSIS — I2510 Atherosclerotic heart disease of native coronary artery without angina pectoris: Secondary | ICD-10-CM | POA: Diagnosis present

## 2024-05-17 DIAGNOSIS — E785 Hyperlipidemia, unspecified: Secondary | ICD-10-CM | POA: Diagnosis present

## 2024-05-17 DIAGNOSIS — S72109A Unspecified trochanteric fracture of unspecified femur, initial encounter for closed fracture: Secondary | ICD-10-CM | POA: Diagnosis present

## 2024-05-17 DIAGNOSIS — Y92008 Other place in unspecified non-institutional (private) residence as the place of occurrence of the external cause: Secondary | ICD-10-CM | POA: Diagnosis not present

## 2024-05-17 DIAGNOSIS — G9341 Metabolic encephalopathy: Secondary | ICD-10-CM | POA: Diagnosis not present

## 2024-05-17 DIAGNOSIS — J449 Chronic obstructive pulmonary disease, unspecified: Secondary | ICD-10-CM | POA: Diagnosis present

## 2024-05-17 DIAGNOSIS — Z6832 Body mass index (BMI) 32.0-32.9, adult: Secondary | ICD-10-CM | POA: Diagnosis not present

## 2024-05-17 DIAGNOSIS — Z881 Allergy status to other antibiotic agents status: Secondary | ICD-10-CM | POA: Diagnosis not present

## 2024-05-17 DIAGNOSIS — I739 Peripheral vascular disease, unspecified: Secondary | ICD-10-CM | POA: Diagnosis present

## 2024-05-17 DIAGNOSIS — D631 Anemia in chronic kidney disease: Secondary | ICD-10-CM | POA: Diagnosis present

## 2024-05-17 DIAGNOSIS — N179 Acute kidney failure, unspecified: Secondary | ICD-10-CM | POA: Diagnosis present

## 2024-05-17 DIAGNOSIS — S72114A Nondisplaced fracture of greater trochanter of right femur, initial encounter for closed fracture: Secondary | ICD-10-CM | POA: Diagnosis present

## 2024-05-17 DIAGNOSIS — E039 Hypothyroidism, unspecified: Secondary | ICD-10-CM | POA: Diagnosis present

## 2024-05-17 DIAGNOSIS — Z833 Family history of diabetes mellitus: Secondary | ICD-10-CM | POA: Diagnosis not present

## 2024-05-17 DIAGNOSIS — Z8249 Family history of ischemic heart disease and other diseases of the circulatory system: Secondary | ICD-10-CM | POA: Diagnosis not present

## 2024-05-17 DIAGNOSIS — B259 Cytomegaloviral disease, unspecified: Secondary | ICD-10-CM | POA: Diagnosis present

## 2024-05-17 LAB — BASIC METABOLIC PANEL WITH GFR
Anion gap: 11 (ref 5–15)
Anion gap: 8 (ref 5–15)
BUN: 42 mg/dL — ABNORMAL HIGH (ref 8–23)
BUN: 50 mg/dL — ABNORMAL HIGH (ref 8–23)
CO2: 23 mmol/L (ref 22–32)
CO2: 25 mmol/L (ref 22–32)
Calcium: 9 mg/dL (ref 8.9–10.3)
Calcium: 8.9 mg/dL (ref 8.9–10.3)
Chloride: 102 mmol/L (ref 98–111)
Chloride: 102 mmol/L (ref 98–111)
Creatinine, Ser: 3.2 mg/dL — ABNORMAL HIGH (ref 0.44–1.00)
Creatinine, Ser: 3.43 mg/dL — ABNORMAL HIGH (ref 0.44–1.00)
GFR, Estimated: 15 mL/min — ABNORMAL LOW (ref >60)
GFR, Estimated: 14 mL/min — ABNORMAL LOW (ref >60)
Glucose, Bld: 92 mg/dL (ref 70–99)
Glucose, Bld: 90 mg/dL (ref 70–99)
Potassium: 4.1 mmol/L (ref 3.5–5.1)
Potassium: 4.1 mmol/L (ref 3.5–5.1)
Sodium: 136 mmol/L (ref 135–145)
Sodium: 135 mmol/L (ref 135–145)

## 2024-05-17 LAB — CBC
HCT: 26.5 % — ABNORMAL LOW (ref 36.0–46.0)
Hemoglobin: 8.4 g/dL — ABNORMAL LOW (ref 12.0–15.0)
MCH: 35.4 pg — ABNORMAL HIGH (ref 26.0–34.0)
MCHC: 31.7 g/dL (ref 30.0–36.0)
MCV: 111.8 fL — ABNORMAL HIGH (ref 80.0–100.0)
Platelets: 167 K/uL (ref 150–400)
RBC: 2.37 MIL/uL — ABNORMAL LOW (ref 3.87–5.11)
RDW: 16.7 % — ABNORMAL HIGH (ref 11.5–15.5)
WBC: 6.6 K/uL (ref 4.0–10.5)
nRBC: 0.3 % — ABNORMAL HIGH (ref 0.0–0.2)

## 2024-05-17 LAB — FOLATE: Folate: >20 ng/mL (ref >5.9)

## 2024-05-17 LAB — VITAMIN B12: Vitamin B-12: 758 pg/mL (ref 180–914)

## 2024-05-17 LAB — VITAMIN D 25 HYDROXY (VIT D DEFICIENCY, FRACTURES): Vit D, 25-Hydroxy: 57.23 ng/mL (ref 30–100)

## 2024-05-17 MED ORDER — IRON SUCROSE 300 MG IVPB - SIMPLE MED
300.0000 mg | Freq: Once | Status: AC
  Administered 2024-05-17: 300 mg via INTRAVENOUS
  Filled 2024-05-17: qty 300

## 2024-05-17 MED ORDER — ENOXAPARIN SODIUM 30 MG/0.3ML IJ SOSY
30.0000 mg | PREFILLED_SYRINGE | INTRAMUSCULAR | Status: DC
  Administered 2024-05-17 – 2024-05-19 (×3): 30 mg via SUBCUTANEOUS
  Filled 2024-05-17 (×3): qty 0.3

## 2024-05-17 MED ORDER — ALBUTEROL SULFATE (2.5 MG/3ML) 0.083% IN NEBU
3.0000 mL | INHALATION_SOLUTION | RESPIRATORY_TRACT | Status: DC | PRN
  Administered 2024-05-19: 3 mL via RESPIRATORY_TRACT
  Filled 2024-05-17: qty 3

## 2024-05-17 MED ORDER — BUDESON-GLYCOPYRROL-FORMOTEROL 160-9-4.8 MCG/ACT IN AERO
2.0000 | INHALATION_SPRAY | Freq: Two times a day (BID) | RESPIRATORY_TRACT | Status: DC
Start: 2024-05-17 — End: 2024-05-23
  Administered 2024-05-17 – 2024-05-23 (×11): 2 via RESPIRATORY_TRACT
  Filled 2024-05-17 (×3): qty 5.9

## 2024-05-17 NOTE — Progress Notes (Signed)
 PROGRESS NOTE    Tina Page  FMW:[id number] DOB: August 09, 1956 DOA: 05/15/2024 PCP: Adina Buel HERO, MD  139A/139A-AA  LOS: 0 days   Brief hospital course: Per H&P HPI: Tina Page is a 68 y.o. 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  She was previously in her usual state of health and denies preceding lightheadedness, chest pain, palpitations, shortness of breath, headache, visual disturbance, one-sided weakness numbness or tingling. In the ED vitals within normal limits Labs notable for baseline hemoglobin of 9.2 and baseline creatinine of 1.5 EKG with NSR at 76 CT hip showed findings suspicious for nondisplaced fracture through the greater trochanter without intertrochanteric involvement. The ED provider spoke with orthopedist Dr. Rolla who advised likely nonoperative but will see patient in the a.m. Patient treated with oxycodone  for pain Admission requested for pain control    Assessment & Plan: Tina Page is a 68 y.o. 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    Nondisplaced fracture of greater trochanter of right femur, initial encounter for closed fracture (HCC) Accidental fall --ortho consulted, rec conservative management --50% protected weightbearing with assistive devices at all times  --hip abduction brace --follow-up next week for repeat  x-rays in ortho office   AKI on CKD (chronic kidney disease) stage 3a Baseline around 1.5.  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 S/p treatment with rituximab  Continue IV Fe infusion   PAD (peripheral artery disease) (HCC) History of lower extremity vascular stents --cont statin  COPD (chronic obstructive pulmonary disease) (HCC) Not acutely exacerbated  Essential hypertension Continue home lisinopril  HCTZ   DVT prophylaxis: Lovenox  SQ Code Status: Full code  Family Communication: family updated at bedside today Level of care: Med-Surg Dispo:   The patient is from: home Anticipated d/c is to: to be determined Anticipated d/c date is: when disposition found   Subjective and Interval History:  No acute events overnight.   Objective: Vitals:   05/16/24 2038 05/17/24 0502 05/17/24 0821 05/17/24 1537  BP: (!) 102/45 (!) 96/51 (!) 94/38 (!) 126/47  Pulse: 70 83 85 83  Resp:  18 17 16   Temp:  99.2 F (37.3 C) 100.2 F (37.9 C) 99.3 F (37.4 C)  TempSrc:  Oral Oral Oral  SpO2: 99% 98% 99% 94%  Weight:      Height:        Intake/Output Summary (Last 24 hours) at 05/17/2024 1654 Last data filed at 05/17/2024 1300 Gross per 24 hour  Intake 180 ml  Output --  Net 180 ml   Filed Weights   05/15/24 1831 05/16/24 0500  Weight: 82.1 kg 82.1 kg    Examination:  Constitutional: In no distress.  Cardiovascular: Normal rate, regular rhythm. No lower extremity edema  Pulmonary: Non labored breathing on nasal cannula, no wheezing or rales.   Abdominal: Soft.  Non distended and non tender Musculoskeletal: Normal range of motion.     Neurological: Alert and oriented to person, place, and time. Non focal  Skin: Skin is warm and dry.    Data Reviewed: I have personally reviewed labs and imaging studies  Time spent: 35  minutes  Alban Pepper, MD [facility] If 7PM-7AM, please contact night-coverage 05/17/2024, 4:54 PM

## 2024-05-17 NOTE — Care Management Obs Status (Signed)
 MEDICARE OBSERVATION STATUS NOTIFICATION   Patient Details  Name: Tina Page MRN: [id number] Date of Birth: 03-14-1956   Medicare Observation Status Notification Given:  Yes    Codee Tutson W, CMA 05/17/2024, 11:37 AM

## 2024-05-17 NOTE — Evaluation (Addendum)
 Physical Therapy Evaluation Patient Details Name: Tina Page MRN: [id number] DOB: 1956-06-16 Today's Date: 05/17/2024  History of Present Illness  Pt is 68 year old female presented to ED after fall admitted with Right greater trochanter avulsion fracture with plans to manage non operatively with hip abduction brace at this time     PMH significant for HTN, COPD, thrombocytopenia, hypothyroidism, HLD, anemia and hepatitis C   Clinical Impression  Pt A&Ox4, agreeable to PT evaluation. Pt cited R hip pain of 8/10 intensity at start of session- RN informed. At baseline, pt in IND with mobility/ADLs, no previous AD use and denies hx of falls prior to one associated with this admission. Pt was met supine in bed, able to initiate rolling to don hip abduction brace, ultimately mod-maxA to achieve sidelying. Pt required maxAx2 for supine > sit transfer with VC for bed rail use and sequencing. Stand pivot transfer to recliner completed with BUE HHA and maxAx2 to guide hips to recliner and offload BLE. Pt remained on 2L supplemental O2 throughout session, SpO2 >90% after transfer > recliner, HR in high 80s at end of session. Pt was left seated in recliner at end of session, all needs in reach, daughter present in room. Pt is currently displaying deficits in BLE strength, balance, functional mobility, and activity tolerance and would benefit from skilled PT intervention to address listed deficits and improve independence with mobility/ADLs.         If plan is discharge home, recommend the following: Two people to help with walking and/or transfers;Two people to help with bathing/dressing/bathroom;Assistance with cooking/housework;Assist for transportation;Help with stairs or ramp for entrance   Can travel by private vehicle   No    Equipment Recommendations Other (comment) (TBD)  Recommendations for Other Services       Functional Status Assessment Patient has had a recent decline in their  functional status and demonstrates the ability to make significant improvements in function in a reasonable and predictable amount of time.     Precautions / Restrictions Precautions Precautions: Fall Recall of Precautions/Restrictions: Intact Required Braces or Orthoses: Other Brace Other Brace: hip abduction brace when OOB Restrictions Weight Bearing Restrictions Per Provider Order: Yes RLE Weight Bearing Per Provider Order: Partial weight bearing RLE Partial Weight Bearing Percentage or Pounds: 50% Other Position/Activity Restrictions: hip abduction brace when OOB      Mobility  Bed Mobility Overal bed mobility: Needs Assistance Bed Mobility: Rolling, Supine to Sit Rolling: Mod assist, Max assist, Used rails   Supine to sit: Max assist, +2 for safety/equipment, HOB elevated, Used rails     General bed mobility comments: pt provides BUE assist for rolling, unable to rotate BLE to achieve sidelying due to pain, maxAx2 to achieve sitting EOB with VC for bed rail use and sequencing    Transfers Overall transfer level: Needs assistance Equipment used: 2 person hand held assist Transfers: Sit to/from Stand, Bed to chair/wheelchair/BSC Sit to Stand: Max assist, +2 physical assistance Stand pivot transfers: Max assist, +2 physical assistance         General transfer comment: maxAx2 for STS with VC for anterior weight shift, stand pivot to recliner with maxAx2 to guide hips to recliner and offload BLE    Ambulation/Gait                  Stairs            Wheelchair Mobility     Tilt Bed    Modified Rankin (Stroke  Patients Only)       Balance Overall balance assessment: Needs assistance Sitting-balance support: Feet supported Sitting balance-Leahy Scale: Good     Standing balance support: Bilateral upper extremity supported Standing balance-Leahy Scale: Poor Standing balance comment: heavy BUE support, difficulty WB RLE                              Pertinent Vitals/Pain Pain Assessment Pain Assessment: 0-10 Pain Score: 8  Pain Location: R hip Pain Descriptors / Indicators: Grimacing, Crying Pain Intervention(s): Limited activity within patient's tolerance, Monitored during session, Repositioned, Patient requesting pain meds-RN notified    Home Living Family/patient expects to be discharged to:: Private residence Living Arrangements: Parent;Other relatives (sister) Available Help at Discharge: Family;Available PRN/intermittently Type of Home: House Home Access: Stairs to enter         Home Equipment: None Additional Comments: 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    Prior Function Prior Level of Function : Independent/Modified Independent             Mobility Comments: IND with mobility, no previous AD use, denies hx of falls prior to admission ADLs Comments: IND with ADLs     Extremity/Trunk Assessment   Upper Extremity Assessment Upper Extremity Assessment: Defer to OT evaluation    Lower Extremity Assessment Lower Extremity Assessment: Generalized weakness       Communication   Communication Communication: No apparent difficulties    Cognition Arousal: Alert Behavior During Therapy: WFL for tasks assessed/performed   PT - Cognitive impairments: No apparent impairments                       PT - Cognition Comments: A&Ox4 Following commands: Intact       Cueing Cueing Techniques: Verbal cues     General Comments      Exercises Other Exercises Other Exercises: Vitals assessed after transfer > recliner: SpO2 >90% with pt on 2L, HR 111 immediately following transfer, decreased to high 80s by end of session.   Assessment/Plan    PT Assessment Patient needs continued PT services  PT Problem List Decreased strength;Decreased range of motion;Decreased activity tolerance;Decreased balance;Decreased mobility;Decreased knowledge of use of DME;Pain       PT Treatment  Interventions DME instruction;Gait training;Stair training;Functional mobility training;Therapeutic activities;Therapeutic exercise;Balance training;Patient/family education    PT Goals (Current goals can be found in the Care Plan section)  Acute Rehab PT Goals Patient Stated Goal: to get better PT Goal Formulation: With patient Time For Goal Achievement: 05/31/24 Potential to Achieve Goals: Fair    Frequency 7X/week     Co-evaluation               AM-PAC PT 6 Clicks Mobility  Outcome Measure Help needed turning from your back to your side while in a flat bed without using bedrails?: A Lot Help needed moving from lying on your back to sitting on the side of a flat bed without using bedrails?: A Lot Help needed moving to and from a bed to a chair (including a wheelchair)?: A Lot Help needed standing up from a chair using your arms (e.g., wheelchair or bedside chair)?: A Lot Help needed to walk in hospital room?: Total Help needed climbing 3-5 steps with a railing? : Total 6 Click Score: 10    End of Session Equipment Utilized During Treatment: Oxygen Activity Tolerance: Patient limited by pain Patient left: in chair;with  call bell/phone within reach;with chair alarm set;with family/visitor present Nurse Communication: Mobility status PT Visit Diagnosis: Unsteadiness on feet (R26.81);Muscle weakness (generalized) (M62.81);History of falling (Z91.81);Difficulty in walking, not elsewhere classified (R26.2);Pain Pain - Right/Left: Right Pain - part of body: Hip    Time: 0903-0928 PT Time Calculation (min) (ACUTE ONLY): 25 min   Charges:   PT Evaluation $PT Eval Low Complexity: 1 Low PT Treatments $Therapeutic Activity: 8-22 mins PT General Charges $$ ACUTE PT VISIT: 1 Visit         Janell Axe, SPT

## 2024-05-17 NOTE — Plan of Care (Signed)

## 2024-05-17 NOTE — Plan of Care (Signed)
   Problem: Health Behavior/Discharge Planning: Goal: Ability to manage health-related needs will improve Outcome: Progressing

## 2024-05-17 NOTE — Progress Notes (Signed)
 Patient did not have any urine output on day shift.  Bladder scanned and patient was 89ml.  Updated Dr. Franchot and order placed for scheduled bladder scans and I & O cath if needed

## 2024-05-18 ENCOUNTER — Ambulatory Visit: Payer: Self-pay | Admitting: Pulmonary Disease

## 2024-05-18 DIAGNOSIS — N179 Acute kidney failure, unspecified: Secondary | ICD-10-CM | POA: Diagnosis not present

## 2024-05-18 DIAGNOSIS — S72101G Unspecified trochanteric fracture of right femur, subsequent encounter for closed fracture with delayed healing: Secondary | ICD-10-CM | POA: Diagnosis not present

## 2024-05-18 LAB — URINALYSIS, COMPLETE (UACMP) WITH MICROSCOPIC
Bacteria, UA: NONE SEEN
Bilirubin Urine: NEGATIVE
Glucose, UA: NEGATIVE mg/dL
Hgb urine dipstick: NEGATIVE
Ketones, ur: NEGATIVE mg/dL
Leukocytes,Ua: NEGATIVE
Nitrite: NEGATIVE
Protein, ur: NEGATIVE mg/dL
RBC / HPF: 0 RBC/hpf (ref 0–5)
Specific Gravity, Urine: 1.024 (ref 1.005–1.030)
WBC, UA: 0 WBC/hpf (ref 0–5)
pH: 5 (ref 5.0–8.0)

## 2024-05-18 LAB — BASIC METABOLIC PANEL WITH GFR
Anion gap: 7 (ref 5–15)
Anion gap: 8 (ref 5–15)
BUN: 50 mg/dL — ABNORMAL HIGH (ref 8–23)
BUN: 54 mg/dL — ABNORMAL HIGH (ref 8–23)
CO2: 26 mmol/L (ref 22–32)
CO2: 26 mmol/L (ref 22–32)
Calcium: 8.5 mg/dL — ABNORMAL LOW (ref 8.9–10.3)
Calcium: 8.8 mg/dL — ABNORMAL LOW (ref 8.9–10.3)
Chloride: 102 mmol/L (ref 98–111)
Chloride: 100 mmol/L (ref 98–111)
Creatinine, Ser: 3.01 mg/dL — ABNORMAL HIGH (ref 0.44–1.00)
Creatinine, Ser: 2.72 mg/dL — ABNORMAL HIGH (ref 0.44–1.00)
GFR, Estimated: 17 mL/min — ABNORMAL LOW (ref >60)
GFR, Estimated: 19 mL/min — ABNORMAL LOW (ref >60)
Glucose, Bld: 89 mg/dL (ref 70–99)
Glucose, Bld: 109 mg/dL — ABNORMAL HIGH (ref 70–99)
Potassium: 3.9 mmol/L (ref 3.5–5.1)
Potassium: 3.9 mmol/L (ref 3.5–5.1)
Sodium: 135 mmol/L (ref 135–145)
Sodium: 134 mmol/L — ABNORMAL LOW (ref 135–145)

## 2024-05-18 LAB — BLOOD GAS, ARTERIAL
Acid-Base Excess: 0.3 mmol/L (ref 0.0–2.0)
Bicarbonate: 26.5 mmol/L (ref 20.0–28.0)
O2 Content: 3 L/min
O2 Saturation: 95 %
Patient temperature: 37
pCO2 arterial: 48 mmHg (ref 32–48)
pH, Arterial: 7.35 (ref 7.35–7.45)
pO2, Arterial: 62 mmHg — ABNORMAL LOW (ref 83–108)

## 2024-05-18 LAB — CBC
HCT: 24.3 % — ABNORMAL LOW (ref 36.0–46.0)
Hemoglobin: 7.5 g/dL — ABNORMAL LOW (ref 12.0–15.0)
MCH: 34.7 pg — ABNORMAL HIGH (ref 26.0–34.0)
MCHC: 30.9 g/dL (ref 30.0–36.0)
MCV: 112.5 fL — ABNORMAL HIGH (ref 80.0–100.0)
Platelets: 150 K/uL (ref 150–400)
RBC: 2.16 MIL/uL — ABNORMAL LOW (ref 3.87–5.11)
RDW: 16.5 % — ABNORMAL HIGH (ref 11.5–15.5)
WBC: 6.3 K/uL (ref 4.0–10.5)
nRBC: 0 % (ref 0.0–0.2)

## 2024-05-18 LAB — BLOOD GAS, VENOUS: Patient temperature: 37

## 2024-05-18 MED ORDER — LACTATED RINGERS IV BOLUS
500.0000 mL | Freq: Once | INTRAVENOUS | Status: AC
  Administered 2024-05-18: 500 mL via INTRAVENOUS

## 2024-05-18 MED ORDER — LACTATED RINGERS IV SOLN: INTRAVENOUS | Status: AC

## 2024-05-18 MED ORDER — IPRATROPIUM-ALBUTEROL 0.5-2.5 (3) MG/3ML IN SOLN
3.0000 mL | Freq: Once | RESPIRATORY_TRACT | Status: AC
  Administered 2024-05-18: 3 mL via RESPIRATORY_TRACT
  Filled 2024-05-18: qty 3

## 2024-05-18 NOTE — Progress Notes (Signed)
 Physical Therapy Treatment Patient Details Name: Tina Page MRN: [id number] DOB: [birth year] Today's Date: 05/18/2024   History of Present Illness Pt is 68 year old female presented to ED after fall admitted with Right greater trochanter avulsion fracture with plans to manage non operatively with hip abduction brace at this time     PMH significant for HTN, COPD, thrombocytopenia, hypothyroidism, HLD, anemia and hepatitis C    PT Comments  Pt was asleep in bed upon arrival with supportive daughter at bedside. Daughter encouraged dino to wake pt for PT session. Pt remains lethargic and mostly non verbal throughout session. Per pt's daughter, her cognition has been bad all day and I had to physically feed her earlier. Pt does not present cognitively like she did 2 days prior when author assisted OT during their evaluation.  Per daughters requested, Author assisted pt OOB to recliner and then back to bed afterwards. +2 assistance for all mobility and transfers. MD made aware post session of cognitive concerns and pt's soft BP (103/46 (59)). Pt is far from her baseline abilities and will benefit from continued skilled PT at DC to maximize her independence and safety with all ADLs while decreasing caregiver burden.    If plan is discharge home, recommend the following: Two people to help with walking and/or transfers;Two people to help with bathing/dressing/bathroom;Assistance with cooking/housework;Assist for transportation;Help with stairs or ramp for entrance     Equipment Recommendations  Other (comment) (Defer to next level of care)       Precautions / Restrictions Precautions Precautions: Fall Recall of Precautions/Restrictions: Intact Required Braces or Orthoses: Other Brace (Hip abduction brace) Other Brace: hip abduction brace when OOB Restrictions Weight Bearing Restrictions Per Provider Order: Yes RLE Weight Bearing Per Provider Order: Partial weight bearing RLE Partial Weight  Bearing Percentage or Pounds: 50% Other Position/Activity Restrictions: hip abduction brace when OOB     Mobility  Bed Mobility Overal bed mobility: Needs Assistance Bed Mobility: Supine to Sit, Sit to Supine Supine to sit: Max assist, +2 for safety/equipment, +2 for physical assistance, HOB elevated, Used rails Sit to supine: Max assist, +2 for physical assistance, +2 for safety/equipment General bed mobility comments: pt requires extensive +2 assistance to exit bed and later to return to bed form EOB sitting. Poor ability to follow commands    Transfers Overall transfer level: Needs assistance Equipment used: Rolling walker (2 wheels) Transfers: Sit to/from Stand, Bed to chair/wheelchair/BSC Sit to Stand: Max assist, +2 safety/equipment, +2 physical assistance, From elevated surface Stand pivot transfers: Max assist, +2 physical assistance, +2 safety/equipment, From elevated surface  General transfer comment: pt stood EOB 2 x and stood pivot to/from bed >< recliner +2 assistance throughout for safety. Pt's BP soft 103/46(59) returned pt to bed and MD made aware. pt did have bolus earlier in the day       Balance Overall balance assessment: Needs assistance Sitting-balance support: Feet supported Sitting balance-Leahy Scale: Good     Standing balance support: Bilateral upper extremity supported, During functional activity, Reliant on assistive device for balance Standing balance-Leahy Scale: Poor    Communication Communication Communication: Impaired Factors Affecting Communication: Reduced clarity of speech  Cognition Arousal: Lethargic Behavior During Therapy: Flat affect    PT - Cognition Comments: Pt was asleep in supine. remains overall lethargic in presentation throughout session. Mostly non verbal. Author reach out to MD post session with concerns of altered cognition. per pt's daughter, She is not been right all day. She could not  even feed herself earlier. I think she  is getting too much pain medications. Pt had not had pain medications since ~ 9am > 6hour prior Following commands: Impaired Following commands impaired: Follows one step commands inconsistently    Cueing Cueing Techniques: Verbal cues, Tactile cues, Gestural cues, Visual cues         Pertinent Vitals/Pain Pain Assessment Pain Assessment: PAINAD Breathing: occasional labored breathing, short period of hyperventilation Negative Vocalization: occasional moan/groan, low speech, negative/disapproving quality Facial Expression: sad, frightened, frown Body Language: tense, distressed pacing, fidgeting Consolability: distracted or reassured by voice/touch PAINAD Score: 5 Pain Location: R hip Pain Descriptors / Indicators: Grimacing Pain Intervention(s): Limited activity within patient's tolerance, Monitored during session, Repositioned     PT Goals (current goals can now be found in the care plan section) Acute Rehab PT Goals Patient Stated Goal: none stated- mostly non verbal Progress towards PT goals: Not progressing toward goals - comment    Frequency    7X/week       AM-PAC PT 6 Clicks Mobility   Outcome Measure  Help needed turning from your back to your side while in a flat bed without using bedrails?: A Lot Help needed moving from lying on your back to sitting on the side of a flat bed without using bedrails?: Total Help needed moving to and from a bed to a chair (including a wheelchair)?: Total Help needed standing up from a chair using your arms (e.g., wheelchair or bedside chair)?: Total Help needed to walk in hospital room?: Total Help needed climbing 3-5 steps with a railing? : Total 6 Click Score: 7    End of Session Equipment Utilized During Treatment: Oxygen (2L throughout) Activity Tolerance: Patient limited by fatigue Patient left: in bed;with call bell/phone within reach;with bed alarm set;with family/visitor present (pastor and daughter present) Nurse  Communication: Mobility status PT Visit Diagnosis: Unsteadiness on feet (R26.81);Muscle weakness (generalized) (M62.81);History of falling (Z91.81);Difficulty in walking, not elsewhere classified (R26.2);Pain Pain - Right/Left: Right Pain - part of body: Hip     Time: 1450-1515 PT Time Calculation (min) (ACUTE ONLY): 25 min  Charges:    $Therapeutic Activity: 23-37 mins PT General Charges $$ ACUTE PT VISIT: 1 Visit                     Rankin Essex PTA 05/18/24, 4:54 PM

## 2024-05-18 NOTE — Progress Notes (Signed)
 PROGRESS NOTE    Tina Page  FMW:[id number] DOB: [birth year] DOA: 05/15/2024 PCP: Adina Buel HERO, MD  140A/140A-BB  LOS: 1 day   Brief hospital course: Per H&P HPI: Tina Page is a 68 y.o. 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  She was previously in her usual state of health and denies preceding lightheadedness, chest pain, palpitations, shortness of breath, headache, visual disturbance, one-sided weakness numbness or tingling. In the ED vitals within normal limits Labs notable for baseline hemoglobin of 9.2 and baseline creatinine of 1.5 EKG with NSR at 76 CT hip showed findings suspicious for nondisplaced fracture through the greater trochanter without intertrochanteric involvement. The ED provider spoke with orthopedist Dr. Rolla who advised likely nonoperative but will see patient in the a.m. Patient treated with oxycodone  for pain Admission requested for pain control    Assessment & Plan: Tina Page is a 68 y.o. 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    Nondisplaced fracture of greater trochanter of right femur, initial encounter for closed fracture (HCC) Accidental fall --ortho consulted, rec conservative management --50% protected weightbearing with assistive devices at all times  --hip abduction brace --follow-up next week for repeat  x-rays in ortho office   AMS Unclear etiology of patient's drowsiness.  She did not receive any pain medication recently.  Mild wheezing was noted diffusely.  Patient's BMP is notable for elevated BUN, this could be contributing to her symptoms.  - Follow-up [facility] -Continue IV fluids  -Follow up   AKI on CKD (chronic kidney disease) stage 3a Baseline around 1.5.  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 S/p treatment with rituximab  Continue IV Fe infusion   PAD (peripheral artery disease) (HCC) History of lower extremity vascular stents --cont statin  COPD (chronic obstructive pulmonary disease) (HCC) Not acutely exacerbated  Essential hypertension Hold home lisinopril  HCTZ given low normal blood pressures and AKI   DVT prophylaxis: Lovenox  SQ Code Status: Full code  Family Communication: family updated at bedside today Level of care: Med-Surg Dispo:   The patient is from: home Anticipated d/c is to: SNF Anticipated d/c date is: when disposition found   Subjective and Interval History:  Some drowsiness and confusion this afternoon.  Pain is well-controlled.  Objective: Vitals:   05/18/24 0436 05/18/24 0802 05/18/24 1521 05/18/24 1703  BP: (!) 102/45 (!) 100/42 (!) 113/46   Pulse: 86 89 96   Resp: 18 16 18    Temp: 99 F (37.2 C) 99.2 F (37.3 C) 99.7 F (37.6 C)   TempSrc:  Oral Oral   SpO2: 90% (!) 75% 91% 98%  Weight:      Height:        Intake/Output Summary (Last 24 hours) at 05/18/2024 1814 Last data filed at 05/18/2024 1751 Gross per 24 hour  Intake 620 ml  Output 1300 ml  Net -680 ml   Filed Weights   05/15/24 1831 05/16/24 0500  Weight: 82.1 kg 82.1 kg    Physical Exam  Constitutional: In no distress. Drowsy easily awakes  Cardiovascular: Normal rate, regular rhythm. No lower  extremity edema  Pulmonary: Non labored breathing on [place], Mild wheezing diffusely    Abdominal: Soft. Non distended and non tender     MSK: RLE in brace  Neurological: Alert and oriented to person, place, and time.Also knew current president. Exam is non focal. Exam of RLE limited due to pain  Skin: Skin is warm and dry.     Data Reviewed: I have personally reviewed labs and imaging studies  Time spent: 35 minutes  Alban Pepper, MD [facility] If 7PM-7AM, please contact night-coverage 05/18/2024, 6:14 PM

## 2024-05-18 NOTE — [place] FL2 (Signed)
 [place]  MEDICAID FL2 LEVEL OF CARE FORM     IDENTIFICATION  Patient Name: Tina Page Birthdate: [birth year] Sex: female Admission Date (Current Location): 05/15/2024  [facility] and [place] Number:  Chiropodist and Address:  [facility], [address] [place], [place] [address]      Provider Number: [id number]  Attending Physician Name and Address:  Franchot Novel, MD  Relative Name and Phone Number:       Current Level of Care: Hospital Recommended Level of Care: Skilled Nursing Facility Prior Approval Number:    Date Approved/Denied:   PASRR Number: [id number]  Discharge Plan: SNF    Current Diagnoses: Patient Active Problem List   Diagnosis Date Noted   Traumatic closed trochanteric fracture of femur with minimal displacement (HCC) 05/17/2024   Nondisplaced fracture of greater trochanter of right femur, initial encounter for closed fracture (HCC) 05/15/2024   Traumatic closed trochanteric fracture of femur with minimal displacement with delayed healing, right 05/15/2024   Anemia in chronic kidney disease (CKD) 03/30/2022   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   Preseptal cellulitis of right eye 08/29/2021   Leukopenia 11/22/2020   Herpes zoster without complication    Left leg pain 11/17/2020   Diarrhea 09/01/2020   Clostridium difficile diarrhea 08/26/2020   Lupus 08/11/2020   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HCC) 08/11/2020   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) 08/11/2020   Papular rash 08/05/2020   Bacteremia due to Escherichia coli 07/17/2020   UTI (urinary tract infection) 07/16/2020   Hypothyroidism    Fever    Encounter for antineoplastic immunotherapy 07/15/2020   Goals of care, counseling/discussion 06/19/2020   Abnormal CT of the chest 06/04/2020   Oral candidiasis 06/04/2020   Former smoker 06/04/2020   Pulmonary nodules 12/28/2019   CKD (chronic kidney  disease) stage 3, GFR 30-59 ml/min (HCC) 05/15/2019   Postop check 04/24/2019   Paronychia of great toe of right foot 04/17/2019   Ingrowing nail 04/17/2019   Medication monitoring encounter 03/25/2018   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   Elevated liver function tests 11/03/2017   Elevated uric acid in blood 11/03/2017   Low serum cortisol level 01/04/2017   Elevated alkaline phosphatase level 10/25/2016   Oral herpes simplex infection 10/25/2016   Current chronic use of systemic steroids 10/23/2016   Impetigo 10/23/2016   Thrombocytopenia (HCC) 10/04/2016   B12 deficiency 08/06/2016   Symptomatic anemia 07/21/2016   Atherosclerosis of native arteries of extremity with intermittent claudication (HCC) 07/20/2016   PAD (peripheral artery disease) (HCC) 07/12/2016   Post PTCA 07/12/2016   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   Autoimmune hemolytic anemia, 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/12/2016   Hepatitis B core antibody positive 07/12/2016   Tobacco abuse 07/12/2016   Leg pain 07/07/2016   Essential hypertension 07/07/2016   HLD (hyperlipidemia) 07/07/2016   COPD (chronic obstructive pulmonary disease) (HCC) 07/07/2016   Acute kidney injury superimposed on CKD (HCC) 07/07/2016   Heme positive stool 01/29/2015    Orientation RESPIRATION BLADDER Height & Weight     Self, Time, Situation, Place  O2 (2 L) Continent Weight: 181 lb (82.1 kg) Height:  5' 3 (160 cm)  BEHAVIORAL SYMPTOMS/MOOD NEUROLOGICAL BOWEL NUTRITION STATUS      Continent Diet (Heart)  AMBULATORY STATUS COMMUNICATION OF NEEDS Skin   Extensive Assist Verbally Normal                        Personal Care Assistance Level of Assistance  Bathing, Dressing, Feeding Bathing Assistance: Limited assistance Feeding assistance: Independent Dressing Assistance: Limited assistance     Functional Limitations Info  Sight, Hearing, Speech Sight Info: Impaired Hearing Info: Adequate Speech Info: Adequate    SPECIAL CARE FACTORS FREQUENCY  PT (By licensed PT), OT (By licensed OT)     PT Frequency: 5x/week OT Frequency: 5x/week            Contractures      Additional Factors Info  Code Status, Allergies Code Status Info: Full Allergies Info: Ciprofloxacin , Codeine, Nsaids           Current Medications (05/18/2024):  This is the current hospital active medication list Current Facility-Administered Medications  Medication Dose Route Frequency Provider Last Rate Last Admin   acetaminophen  (TYLENOL ) tablet 650 mg  650 mg Oral Q6H PRN Duncan, Hazel V, MD   650 mg at 05/16/24 0126   Or   acetaminophen  (TYLENOL ) suppository 650 mg  650 mg Rectal Q6H PRN Cleatus Delayne GAILS, MD       albuterol  (PROVENTIL ) (2.5 MG/3ML) 0.083% nebulizer solution 3 mL  3 mL Nebulization Q4H PRN Franchot Novel, MD       budesonide -glycopyrrolate -formoterol  (BREZTRI ) 160-9-4.8 MCG/ACT inhaler 2 puff  2 puff Inhalation BID Franchot Novel, MD   2 puff at 05/18/24 0857   enoxaparin  (LOVENOX ) injection 30 mg  30 mg Subcutaneous Q24H Elesa Perkins, RPH   30 mg at 05/17/24 2140   lisinopril  (ZESTRIL ) tablet 20 mg  20 mg Oral Daily Dail Rankin RAMAN, RPH   20 mg at 05/18/24 9141   And   hydrochlorothiazide  (HYDRODIURIL ) tablet 25 mg  25 mg Oral Daily Belue, Nathan S, RPH   25 mg at 05/18/24 0858   HYDROcodone -acetaminophen  (NORCO/VICODIN) 5-325 MG per tablet 1-2 tablet  1-2 tablet Oral Q4H PRN Duncan, Hazel V, MD   1 tablet at 05/17/24 2140   Influenza vac split trivalent PF (FLUZONE HIGH-DOSE) injection 0.5 mL  0.5 mL Intramuscular Tomorrow-1000 Cleatus Delayne V, MD       levothyroxine   (SYNTHROID ) tablet 75 mcg  75 mcg Oral Q0600 Cleatus Delayne GAILS, MD   75 mcg at 05/18/24 0526   morphine  (PF) 2 MG/ML injection 2 mg  2 mg Intravenous Q2H PRN Duncan, Hazel V, MD   2 mg at 05/17/24 9061   ondansetron  (ZOFRAN ) tablet 4 mg  4 mg Oral Q6H PRN Duncan, Hazel V, MD       Or   ondansetron  (ZOFRAN ) injection 4 mg  4 mg Intravenous Q6H PRN Duncan, Hazel V, MD   4 mg at 05/17/24 1338   pantoprazole  (PROTONIX ) EC tablet 40 mg  40 mg Oral Daily Duncan, Hazel V, MD   40 mg at 05/18/24 0858   rosuvastatin  (CRESTOR ) tablet 40 mg  40 mg Oral Daily Duncan, Hazel V, MD   40 mg at 05/18/24 9141     Discharge Medications: Please see discharge summary for a list of discharge medications.  Relevant Imaging Results:  Relevant Lab Results:   Additional Information SSN: [id number]  Alvaro Louder, LCSW

## 2024-05-18 NOTE — TOC Initial Note (Signed)
 Transition of Care [facility]) - Initial/Assessment Note    Patient Details  Name: Tina Page MRN: [id number] Date of Birth: 1956/01/01  [facility]) CM/SW Contact:    Alvaro Louder, LCSW Phone Number: 05/18/2024, 9:37 AM  Clinical Narrative:      Per Chart review patient from home. PCP is Buel Jewels. LCSWA Faxed out information to SNF's in [place]. LCSWA will present Facilities to patient at the bedside.            TOC to follow for discharge        Patient Goals and CMS Choice            Expected Discharge Plan and Services                                              Prior Living Arrangements/Services                       Activities of Daily Living   ADL Screening (condition at time of admission) Independently performs ADLs?: No Does the patient have a NEW difficulty with bathing/dressing/toileting/self-feeding that is expected to last >3 days?: Yes (Initiates electronic notice to provider for possible OT consult) Does the patient have a NEW difficulty with getting in/out of bed, walking, or climbing stairs that is expected to last >3 days?: Yes (Initiates electronic notice to provider for possible PT consult) Does the patient have a NEW difficulty with communication that is expected to last >3 days?: No Is the patient deaf or have difficulty hearing?: No Does the patient have difficulty seeing, even when wearing glasses/contacts?: No Does the patient have difficulty concentrating, remembering, or making decisions?: No  Permission Sought/Granted                  Emotional Assessment              Admission diagnosis:  Fall, initial encounter [W19.XXXA] Closed fracture of right hip, initial encounter (HCC) [S72.001A] Traumatic closed trochanteric fracture of femur with minimal displacement with delayed healing, right [S72.101G] Traumatic closed trochanteric fracture of femur with minimal displacement (HCC)  [S72.109A] Patient Active Problem List   Diagnosis Date Noted   Traumatic closed trochanteric fracture of femur with minimal displacement (HCC) 05/17/2024   Nondisplaced fracture of greater trochanter of right femur, initial encounter for closed fracture (HCC) 05/15/2024   Traumatic closed trochanteric fracture of femur with minimal displacement with delayed healing, right 05/15/2024   Anemia in chronic kidney disease (CKD) 03/30/2022   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   Preseptal cellulitis of right eye 08/29/2021   Leukopenia 11/22/2020   Herpes zoster without complication    Left leg pain 11/17/2020   Diarrhea 09/01/2020   Clostridium difficile diarrhea 08/26/2020   Lupus 08/11/2020   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HCC) 08/11/2020   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) 08/11/2020   Papular rash 08/05/2020   Bacteremia due to Escherichia coli 07/17/2020   UTI (urinary tract infection) 07/16/2020   Hypothyroidism    Fever    Encounter for antineoplastic immunotherapy 07/15/2020   Goals of care, counseling/discussion 06/19/2020   Abnormal CT of the chest 06/04/2020   Oral candidiasis 06/04/2020   Former smoker 06/04/2020   Pulmonary nodules 12/28/2019   CKD (chronic kidney disease) stage 3, GFR 30-59 ml/min (  HCC) 05/15/2019   Postop check 04/24/2019   Paronychia of great toe of right foot 04/17/2019   Ingrowing nail 04/17/2019   Medication monitoring encounter 03/25/2018   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   Elevated liver function tests 11/03/2017   Elevated uric acid in blood 11/03/2017   Low serum  cortisol level 01/04/2017   Elevated alkaline phosphatase level 10/25/2016   Oral herpes simplex infection 10/25/2016   Current chronic use of systemic steroids 10/23/2016   Impetigo 10/23/2016   Thrombocytopenia (HCC) 10/04/2016   B12 deficiency 08/06/2016   Symptomatic anemia 07/21/2016   Atherosclerosis of native arteries of extremity with intermittent claudication (HCC) 07/20/2016   PAD (peripheral artery disease) (HCC) 07/12/2016   Post PTCA 07/12/2016   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   Autoimmune hemolytic anemia, 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 ([address]), [place] - [address] [place]) [place] [address] Phone: [phone] Fax: [phone]     Social Drivers of Health (SDOH) Social History: SDOH Screenings   Food Insecurity: No Food Insecurity (05/16/2024)  Housing: Unknown (05/16/2024)  Transportation Needs: No Transportation Needs (05/16/2024)  Utilities: Not At Risk (05/16/2024)  Social Connections: Unknown (05/16/2024)  Tobacco Use: Medium Risk (05/15/2024)   SDOH Interventions:     Readmission Risk Interventions     No data to display

## 2024-05-19 DIAGNOSIS — S72101G Unspecified trochanteric fracture of right femur, subsequent encounter for closed fracture with delayed healing: Secondary | ICD-10-CM | POA: Diagnosis not present

## 2024-05-19 DIAGNOSIS — N179 Acute kidney failure, unspecified: Secondary | ICD-10-CM | POA: Diagnosis not present

## 2024-05-19 LAB — RESPIRATORY PANEL BY PCR

## 2024-05-19 LAB — CBC WITH DIFFERENTIAL/PLATELET
Abs Immature Granulocytes: 0.03 K/uL (ref 0.00–0.07)
Basophils Absolute: 0 K/uL (ref 0.0–0.1)
Basophils Relative: 1 %
Eosinophils Absolute: 0 K/uL (ref 0.0–0.5)
Eosinophils Relative: 1 %
HCT: 22 % — ABNORMAL LOW (ref 36.0–46.0)
Hemoglobin: 7 g/dL — ABNORMAL LOW (ref 12.0–15.0)
Immature Granulocytes: 1 %
Lymphocytes Relative: 14 %
Lymphs Abs: 0.6 K/uL — ABNORMAL LOW (ref 0.7–4.0)
MCH: 34.7 pg — ABNORMAL HIGH (ref 26.0–34.0)
MCHC: 31.8 g/dL (ref 30.0–36.0)
MCV: 108.9 fL — ABNORMAL HIGH (ref 80.0–100.0)
Monocytes Absolute: 0.4 K/uL (ref 0.1–1.0)
Monocytes Relative: 9 %
Neutro Abs: 3.4 K/uL (ref 1.7–7.7)
Neutrophils Relative %: 74 %
Platelets: 114 K/uL — ABNORMAL LOW (ref 150–400)
RBC: 2.02 MIL/uL — ABNORMAL LOW (ref 3.87–5.11)
RDW: 15.9 % — ABNORMAL HIGH (ref 11.5–15.5)
WBC: 4.6 K/uL (ref 4.0–10.5)
nRBC: 0 % (ref 0.0–0.2)

## 2024-05-19 LAB — BASIC METABOLIC PANEL WITH GFR
Anion gap: 8 (ref 5–15)
BUN: 44 mg/dL — ABNORMAL HIGH (ref 8–23)
CO2: 26 mmol/L (ref 22–32)
Calcium: 8.8 mg/dL — ABNORMAL LOW (ref 8.9–10.3)
Chloride: 104 mmol/L (ref 98–111)
Creatinine, Ser: 1.98 mg/dL — ABNORMAL HIGH (ref 0.44–1.00)
GFR, Estimated: 27 mL/min — ABNORMAL LOW (ref >60)
Glucose, Bld: 88 mg/dL (ref 70–99)
Potassium: 4.3 mmol/L (ref 3.5–5.1)
Sodium: 138 mmol/L (ref 135–145)

## 2024-05-19 LAB — PROCALCITONIN: Procalcitonin: 0.45 ng/mL

## 2024-05-19 MED ORDER — SENNOSIDES-DOCUSATE SODIUM 8.6-50 MG PO TABS
1.0000 | ORAL_TABLET | Freq: Every day | ORAL | Status: DC
  Administered 2024-05-19 – 2024-05-23 (×5): 1 via ORAL
  Filled 2024-05-19 (×5): qty 1

## 2024-05-19 MED ORDER — DM-GUAIFENESIN ER 30-600 MG PO TB12
1.0000 | ORAL_TABLET | Freq: Two times a day (BID) | ORAL | Status: DC
  Administered 2024-05-19 – 2024-05-23 (×8): 1 via ORAL
  Filled 2024-05-19 (×8): qty 1

## 2024-05-19 MED ORDER — POLYETHYLENE GLYCOL 3350 17 G PO PACK
17.0000 g | PACK | Freq: Every day | ORAL | Status: DC
  Administered 2024-05-19 – 2024-05-22 (×3): 17 g via ORAL
  Filled 2024-05-19 (×5): qty 1

## 2024-05-19 NOTE — Progress Notes (Signed)
 Physical Therapy Treatment Patient Details Name: Tina Page MRN: [id number] DOB: 10/24/1955 Today's Date: 05/19/2024   History of Present Illness Pt is 68 year old female presented to ED after fall admitted with Right greater trochanter avulsion fracture with plans to manage non operatively with hip abduction brace at this time     PMH significant for HTN, COPD, thrombocytopenia, hypothyroidism, HLD, anemia and hepatitis C    PT Comments  Co-treat performed with OT 2/2 Complexity of the patient's impairments ; For patient/therapist safety; To address functional/ADL transfers and mobility.  Pt progressing with bed mobility and transfers performing a greater percentage of the movement at Mod A +2 for safety.  Pt requires mod verbal and manual cues for safe technique and how to maintain R PWB with RW; pt is able to follow instructions with increased time.  Pt performed mobility tasks on RA maintaining SPO2> 90% and BP was at a functional level with seated and standing activities.  Continued PT will assist pt towards greater functional standing balance, LE strengthening, and activity tolerance to increase safety and independence and decrease burden of care with functional mobility.    If plan is discharge home, recommend the following: Two people to help with walking and/or transfers;Two people to help with bathing/dressing/bathroom;Assistance with cooking/housework;Assist for transportation;Help with stairs or ramp for entrance   Can travel by private vehicle     No  Equipment Recommendations  Other (comment) (TBD)    Recommendations for Other Services       Precautions / Restrictions Precautions Precautions: Fall Recall of Precautions/Restrictions: Intact Required Braces or Orthoses: Other Brace (Hip abduction brace) Other Brace: hip abduction brace when OOB Restrictions Weight Bearing Restrictions Per Provider Order: Yes RLE Weight Bearing Per Provider Order: Partial weight  bearing RLE Partial Weight Bearing Percentage or Pounds: 50% Other Position/Activity Restrictions: hip abduction brace when OOB     Mobility  Bed Mobility Overal bed mobility: Needs Assistance Bed Mobility: Supine to Sit, Sit to Supine Rolling: Mod assist   Supine to sit: Mod assist, +2 for physical assistance, HOB elevated, Used rails     General bed mobility comments: required increased time but when given pt able to perform greater percent of transfer.    Transfers Overall transfer level: Needs assistance Equipment used: Rolling walker (2 wheels) Transfers: Sit to/from Stand, Bed to chair/wheelchair/BSC Sit to Stand: Mod assist, +2 physical assistance, From elevated surface   Step pivot transfers: Mod assist, +2 physical assistance       General transfer comment: mod cues for technique to maintain R PWB pt able to follow instructions with verbal and manual cues.    Ambulation/Gait Ambulation/Gait assistance: Mod assist, +2 physical assistance Gait Distance (Feet): 3 Feet (5 steps to turn and step back to recliner) Assistive device: Rolling walker (2 wheels) Gait Pattern/deviations: Step-through pattern, Trunk flexed       General Gait Details: mod cues for technique to maintain R PWB pt able to follow instructions with verbal and manual cues.  Pt on RA, SPO2 >90%.   Stairs             Wheelchair Mobility     Tilt Bed    Modified Rankin (Stroke Patients Only)       Balance Overall balance assessment: Needs assistance Sitting-balance support: Feet supported Sitting balance-Leahy Scale: Good     Standing balance support: Bilateral upper extremity supported, During functional activity, Reliant on assistive device for balance Standing balance-Leahy Scale: Poor Standing balance comment:  heavy BUE support, difficulty WB RLE                            Communication Communication Communication: Impaired Factors Affecting Communication:  Reduced clarity of speech  Cognition Arousal: Lethargic Behavior During Therapy: Flat affect   PT - Cognitive impairments: No apparent impairments                       PT - Cognition Comments: Required cues to keep eyes open during session but making appropriate responses to instruction. Following commands: Impaired Following commands impaired: Follows one step commands with increased time    Cueing Cueing Techniques: Verbal cues, Tactile cues, Gestural cues, Visual cues  Exercises Total Joint Exercises Ankle Circles/Pumps: AAROM, Strengthening, Right, 5 reps    General Comments        Pertinent Vitals/Pain Pain Assessment Pain Score: 5  Pain Location: R hip Pain Descriptors / Indicators: Grimacing Pain Intervention(s): Limited activity within patient's tolerance, Monitored during session, Premedicated before session    Home Living                          Prior Function            PT Goals (current goals can now be found in the care plan section) Acute Rehab PT Goals Patient Stated Goal: none stated- mostly non verbal PT Goal Formulation: With patient Time For Goal Achievement: 05/31/24 Potential to Achieve Goals: Fair Progress towards PT goals: Progressing toward goals    Frequency    7X/week      PT Plan      Co-evaluation PT/OT/SLP Co-Evaluation/Treatment: Yes Reason for Co-Treatment: Complexity of the patient's impairments (multi-system involvement);For patient/therapist safety;To address functional/ADL transfers PT goals addressed during session: Mobility/safety with mobility        AM-PAC PT 6 Clicks Mobility   Outcome Measure  Help needed turning from your back to your side while in a flat bed without using bedrails?: A Lot Help needed moving from lying on your back to sitting on the side of a flat bed without using bedrails?: Total Help needed moving to and from a bed to a chair (including a wheelchair)?: Total Help  needed standing up from a chair using your arms (e.g., wheelchair or bedside chair)?: Total Help needed to walk in hospital room?: Total Help needed climbing 3-5 steps with a railing? : Total 6 Click Score: 7    End of Session Equipment Utilized During Treatment: Gait belt Activity Tolerance: Patient limited by fatigue Patient left: in chair;with call bell/phone within reach;with chair alarm set;with family/visitor present Nurse Communication: Mobility status PT Visit Diagnosis: Unsteadiness on feet (R26.81);Muscle weakness (generalized) (M62.81);History of falling (Z91.81);Difficulty in walking, not elsewhere classified (R26.2);Pain Pain - Right/Left: Right Pain - part of body: Hip     Time: 1132-1204 PT Time Calculation (min) (ACUTE ONLY): 32 min  Charges:    $Therapeutic Activity: 8-22 mins (co-treat with OT) PT General Charges $$ ACUTE PT VISIT: 1 Visit                     Harland Irving, PTA  05/19/24, 12:29 PM

## 2024-05-19 NOTE — Progress Notes (Signed)
 Occupational Therapy Treatment Patient Details Name: Tina Page MRN: [id number] DOB: [birth year] Today's Date: 05/19/2024   History of present illness Pt is 68 year old female presented to ED after fall admitted with Right greater trochanter avulsion fracture with plans to manage non operatively with hip abduction brace at this time     PMH significant for HTN, COPD, thrombocytopenia, hypothyroidism, HLD, anemia and hepatitis C   OT comments  Pt seen for OT and PT co-tx to optimize safety with ADL mobility attempts. Pt endorsing 5/10 R hip pain throughout. MOD A +2 for bed mobility and standing from EOB. Heavy intermittent cues for not holding her breath during movements and to keep her eyes open. Pt unable to clarify why it was difficult to maintain eyes open when presented with a few possibilities (I.e., dizziness, pain, fear of falling). Pt required MAX A for bed level LB dressing and for donning the hip brace. VSS despite pt endorsing dizziness after initiating positional changes that did not get worse/better with time. Pt continues to benefit from skilled OT services.       If plan is discharge home, recommend the following:  A lot of help with bathing/dressing/bathroom;Two people to help with walking and/or transfers   Equipment Recommendations  Other (comment) (2ww)    Recommendations for Other Services      Precautions / Restrictions Precautions Precautions: Fall Recall of Precautions/Restrictions: Intact Required Braces or Orthoses: Other Brace Other Brace: hip abduction brace when OOB Restrictions Weight Bearing Restrictions Per Provider Order: Yes RLE Weight Bearing Per Provider Order: Partial weight bearing RLE Partial Weight Bearing Percentage or Pounds: 50% Other Position/Activity Restrictions: hip abduction brace when OOB       Mobility Bed Mobility Overal bed mobility: Needs Assistance Bed Mobility: Sidelying to Sit Rolling: Mod assist Sidelying to sit: Mod  assist, +2 for physical assistance, HOB elevated, Used rails       General bed mobility comments: cues for sequencing    Transfers Overall transfer level: Needs assistance Equipment used: Rolling walker (2 wheels) Transfers: Sit to/from Stand, Bed to chair/wheelchair/BSC Sit to Stand: Mod assist, +2 physical assistance, From elevated surface     Step pivot transfers: Mod assist, +2 physical assistance     General transfer comment: mod cues for technique to maintain R PWB pt able to follow instructions with verbal and manual cues.     Balance Overall balance assessment: Needs assistance Sitting-balance support: Feet supported Sitting balance-Leahy Scale: Good     Standing balance support: Bilateral upper extremity supported, During functional activity, Reliant on assistive device for balance Standing balance-Leahy Scale: Poor Standing balance comment: heavy BUE support, difficulty WB RLE                           ADL either performed or assessed with clinical judgement   ADL Overall ADL's : Needs assistance/impaired                     Lower Body Dressing: Maximal assistance;Bed level Lower Body Dressing Details (indicate cue type and reason): donn hip abduction brace in supine, socks                    Extremity/Trunk Assessment              Vision       Perception     Praxis     Communication Communication Communication: Impaired Factors Affecting Communication:  Reduced clarity of speech   Cognition Arousal: Lethargic Behavior During Therapy: Flat affect               OT - Cognition Comments: max VC throughout session to maintain eyes open, don't hold her breath with mobility, and MOD VC for PWBing precautions and R foot positioning with mobility                 Following commands: Impaired Following commands impaired: Follows one step commands with increased time, Follows multi-step commands inconsistently       Cueing   Cueing Techniques: Verbal cues, Tactile cues, Gestural cues, Visual cues  Exercises      Shoulder Instructions       General Comments BP monitored with mobility 2/2 dizziness    Pertinent Vitals/ Pain       Pain Assessment Pain Assessment: 0-10 Pain Score: 5  Pain Location: R hip Pain Descriptors / Indicators: Grimacing, Aching Pain Intervention(s): Limited activity within patient's tolerance, Monitored during session, Repositioned, Patient requesting pain meds-RN notified, Premedicated before session  Home Living                                          Prior Functioning/Environment              Frequency  Min 2X/week        Progress Toward Goals  OT Goals(current goals can now be found in the care plan section)  Progress towards OT goals: Progressing toward goals  Acute Rehab OT Goals Patient Stated Goal: improve function OT Goal Formulation: With patient Time For Goal Achievement: 05/30/24 Potential to Achieve Goals: Good  Plan      Co-evaluation    PT/OT/SLP Co-Evaluation/Treatment: Yes Reason for Co-Treatment: Complexity of the patient's impairments (multi-system involvement);For patient/therapist safety;To address functional/ADL transfers PT goals addressed during session: Mobility/safety with mobility;Balance;Proper use of DME OT goals addressed during session: ADL's and self-care;Proper use of Adaptive equipment and DME      AM-PAC OT 6 Clicks Daily Activity     Outcome Measure   Help from another person eating meals?: None Help from another person taking care of personal grooming?: A Little Help from another person toileting, which includes using toliet, bedpan, or urinal?: A Lot Help from another person bathing (including washing, rinsing, drying)?: A Lot Help from another person to put on and taking off regular upper body clothing?: None Help from another person to put on and taking off regular lower body  clothing?: A Lot 6 Click Score: 17    End of Session Equipment Utilized During Treatment: Gait belt;Rolling walker (2 wheels)  OT Visit Diagnosis: Other abnormalities of gait and mobility (R26.89);Muscle weakness (generalized) (M62.81)   Activity Tolerance Patient tolerated treatment well   Patient Left in chair;with call bell/phone within reach;with chair alarm set   Nurse Communication Mobility status;Other (comment) (BPs)        Time: 8863-8795 OT Time Calculation (min): 28 min  Charges: OT General Charges $OT Visit: 1 Visit OT Treatments $Self Care/Home Management : 8-22 mins  Warren SAUNDERS., MPH, MS, OTR/L ascom [phone] 05/19/24, 2:39 PM

## 2024-05-19 NOTE — Care Management Important Message (Signed)
 Important Message  Patient Details  Name: Tina Page MRN: [id number] Date of Birth: 08/12/1956   Important Message Given:  Yes - Medicare IM     [name] 05/19/2024, 2:44 PM

## 2024-05-19 NOTE — Progress Notes (Addendum)
 PROGRESS NOTE    DEARA BOBER  FMW:[id number] DOB: 1956/06/02 DOA: 05/15/2024 PCP: Adina Buel HERO, MD  153A/153A-AA  LOS: 2 days   Brief hospital course: Per H&P HPI: Tina Page is a 68 y.o. 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  She was previously in her usual state of health and denies preceding lightheadedness, chest pain, palpitations, shortness of breath, headache, visual disturbance, one-sided weakness numbness or tingling. In the ED vitals within normal limits Labs notable for baseline hemoglobin of 9.2 and baseline creatinine of 1.5 EKG with NSR at 76 CT hip showed findings suspicious for nondisplaced fracture through the greater trochanter without intertrochanteric involvement. The ED provider spoke with orthopedist Dr. Rolla who advised likely nonoperative but will see patient in the a.m. Patient treated with oxycodone  for pain Admission requested for pain control    Assessment & Plan: WANNA GULLY is a 68 y.o. 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    Nondisplaced fracture of greater trochanter of right femur, initial encounter for closed fracture (HCC) Accidental fall --ortho consulted, rec conservative management --50% protected weightbearing with assistive devices at all times  --hip abduction brace --follow-up next week for repeat  x-rays in ortho office   Metabolic encephalopathy  Unclear etiology.  Resolved this a.m.  Likely due to dehydration and worsening renal function.  ABG with normal pCO2. AKI on CKD (chronic kidney disease) stage 3a Baseline around 1.5.  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  Discussed with Dr. Babara who did not feel this is related to hemolysis. - Recommended patient be transfused with 1 unit of packed red blood cells  PAD (peripheral artery disease) (HCC) History of lower extremity vascular stents --cont statin  COPD (chronic obstructive pulmonary disease) (HCC) Not acutely exacerbated  Essential hypertension Hold home lisinopril  HCTZ given low normal blood pressures and AKI  Class I Obesity  BMI 32.06 complicates care   DVT prophylaxis: Lovenox  SQ Code Status: Full code  Family Communication: family updated at bedside today Level of care: Med-Surg Dispo:   The patient is from: home Anticipated d/c is to: SNF Anticipated d/c date is: when disposition found   Subjective and Interval History:  No issues this a.m.  Patient more alert and mentation improved.  Objective: Vitals:   05/19/24 0226 05/19/24 0315 05/19/24 0749 05/19/24 1709  BP: (!) 110/37  (!) 122/50 (!) 125/49  Pulse: (!) 107  90 88  Resp: 18  20 19   Temp: 99 F (37.2 C)  98.8 F (37.1 C) 98.4 F (36.9 C)  TempSrc:    Oral  SpO2: 90% 96% 100% (!) 78%  Weight:      Height:  Intake/Output Summary (Last 24 hours) at 05/19/2024 1813 Last data filed at 05/19/2024 1000 Gross per 24 hour  Intake 0 ml  Output 2400 ml  Net -2400 ml   Filed Weights   05/15/24 1831 05/16/24 0500  Weight: 82.1 kg 82.1 kg      Constitutional: In no distress.  Cardiovascular: Normal rate, regular rhythm. No lower extremity edema  Pulmonary: Non labored breathing on room air, no wheezing or rales.   Abdominal: Soft. Non distended and non tender Musculoskeletal: RLE in brace  Neurological: Alert and oriented to person, place, and time. Non focal  Skin: Skin is warm and dry.    Data Reviewed: I have personally reviewed labs and imaging studies  Time spent: 35 minutes  Alban Pepper, MD [facility] If 7PM-7AM, please contact night-coverage 05/19/2024, 6:13 PM

## 2024-05-19 NOTE — Plan of Care (Signed)
  Problem: Activity: Goal: Risk for activity intolerance will decrease Outcome: Progressing   Problem: Nutrition: Goal: Adequate nutrition will be maintained Outcome: Progressing   Problem: Coping: Goal: Level of anxiety will decrease Outcome: Progressing   Problem: Pain Managment: Goal: General experience of comfort will improve and/or be controlled Outcome: Progressing

## 2024-05-19 NOTE — Plan of Care (Signed)
  Problem: Pain Managment: Goal: General experience of comfort will improve and/or be controlled Outcome: Progressing   Problem: Safety: Goal: Ability to remain free from injury will improve Outcome: Progressing

## 2024-05-20 DIAGNOSIS — S72101G Unspecified trochanteric fracture of right femur, subsequent encounter for closed fracture with delayed healing: Secondary | ICD-10-CM | POA: Diagnosis not present

## 2024-05-20 DIAGNOSIS — N179 Acute kidney failure, unspecified: Secondary | ICD-10-CM | POA: Diagnosis not present

## 2024-05-20 LAB — BLOOD GAS, VENOUS
Acid-base deficit: 0.3 mmol/L (ref 0.0–2.0)
Bicarbonate: 28 mmol/L (ref 20.0–28.0)
Patient temperature: 37
pCO2, Ven: 61 mmHg — ABNORMAL HIGH (ref 44–60)
pH, Ven: 7.27 (ref 7.25–7.43)

## 2024-05-20 LAB — COMPREHENSIVE METABOLIC PANEL WITH GFR
ALT: 16 U/L (ref 0–44)
AST: 24 U/L (ref 15–41)
Albumin: 3.2 g/dL — ABNORMAL LOW (ref 3.5–5.0)
Alkaline Phosphatase: 90 U/L (ref 38–126)
Anion gap: 9 (ref 5–15)
BUN: 37 mg/dL — ABNORMAL HIGH (ref 8–23)
CO2: 28 mmol/L (ref 22–32)
Calcium: 9 mg/dL (ref 8.9–10.3)
Chloride: 100 mmol/L (ref 98–111)
Creatinine, Ser: 1.7 mg/dL — ABNORMAL HIGH (ref 0.44–1.00)
GFR, Estimated: 33 mL/min — ABNORMAL LOW (ref >60)
Glucose, Bld: 90 mg/dL (ref 70–99)
Potassium: 4.3 mmol/L (ref 3.5–5.1)
Sodium: 137 mmol/L (ref 135–145)
Total Bilirubin: 1.3 mg/dL — ABNORMAL HIGH (ref 0.0–1.2)
Total Protein: 6.5 g/dL (ref 6.5–8.1)

## 2024-05-20 LAB — CBC
HCT: 21.8 % — ABNORMAL LOW (ref 36.0–46.0)
Hemoglobin: 7.1 g/dL — ABNORMAL LOW (ref 12.0–15.0)
MCH: 35.5 pg — ABNORMAL HIGH (ref 26.0–34.0)
MCHC: 32.6 g/dL (ref 30.0–36.0)
MCV: 109 fL — ABNORMAL HIGH (ref 80.0–100.0)
Platelets: 121 K/uL — ABNORMAL LOW (ref 150–400)
RBC: 2 MIL/uL — ABNORMAL LOW (ref 3.87–5.11)
RDW: 15.7 % — ABNORMAL HIGH (ref 11.5–15.5)
WBC: 4.8 K/uL (ref 4.0–10.5)
nRBC: 0 % (ref 0.0–0.2)

## 2024-05-20 LAB — LACTATE DEHYDROGENASE: LDH: 198 U/L — ABNORMAL HIGH (ref 98–192)

## 2024-05-20 LAB — PREPARE RBC (CROSSMATCH)

## 2024-05-20 MED ORDER — ENOXAPARIN SODIUM 40 MG/0.4ML IJ SOSY
40.0000 mg | PREFILLED_SYRINGE | INTRAMUSCULAR | Status: DC
Start: 2024-05-20 — End: 2024-05-23
  Administered 2024-05-20 – 2024-05-22 (×3): 40 mg via SUBCUTANEOUS
  Filled 2024-05-20 (×3): qty 0.4

## 2024-05-20 MED ORDER — LACTULOSE 10 GM/15ML PO SOLN
20.0000 g | Freq: Once | ORAL | Status: AC
  Administered 2024-05-20: 20 g via ORAL
  Filled 2024-05-20: qty 30

## 2024-05-20 MED ORDER — SODIUM CHLORIDE 0.9% IV SOLUTION: Freq: Once | INTRAVENOUS | Status: DC

## 2024-05-20 MED ORDER — BISACODYL 10 MG RE SUPP
10.0000 mg | Freq: Every day | RECTAL | Status: DC | PRN
  Administered 2024-05-20: 10 mg via RECTAL
  Filled 2024-05-20 (×2): qty 1

## 2024-05-20 NOTE — Plan of Care (Signed)

## 2024-05-20 NOTE — TOC Progression Note (Signed)
 Transition of Care [facility]) - Progression Note    Patient Details  Name: Tina Page MRN: [id number] Date of Birth: 04/29/1956  Transition of [facility]) CM/SW Contact  Victory Jackquline GORMAN, RN Phone Number: 05/20/2024, 4:05 PM  Clinical Narrative:    RNCM, spoke with the patient, I introduced myself, my role, and presented SNF's that she had been accepted to. Patient chose [facility]. Secured chat sent to CMA to obtain insurance auth for [employer]. Will follow up on Monday.  RNCM will continue to follow for discharge  planning/care coordination and update as applicable.                    Expected Discharge Plan and Services                                               Social Drivers of Health (SDOH) Interventions SDOH Screenings   Food Insecurity: No Food Insecurity (05/16/2024)  Housing: Unknown (05/16/2024)  Transportation Needs: No Transportation Needs (05/16/2024)  Utilities: Not At Risk (05/16/2024)  Social Connections: Unknown (05/16/2024)  Tobacco Use: Medium Risk (05/15/2024)    Readmission Risk Interventions     No data to display

## 2024-05-20 NOTE — Progress Notes (Signed)
 PROGRESS NOTE    [name]  FMW:[id number] DOB: 09-Apr-1956 DOA: 05/15/2024 PCP: Tina Buel HERO, MD  153A/153A-AA  LOS: 3 days   Brief hospital course: Per H&P HPI: Tina Page is a 68 y.o. 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  She was previously in her usual state of health and denies preceding lightheadedness, chest pain, palpitations, shortness of breath, headache, visual disturbance, one-sided weakness numbness or tingling. In the ED vitals within normal limits Labs notable for baseline hemoglobin of 9.2 and baseline creatinine of 1.5 EKG with NSR at 76 CT hip showed findings suspicious for nondisplaced fracture through the greater trochanter without intertrochanteric involvement. The ED provider spoke with orthopedist Dr. Rolla who advised likely nonoperative but will see patient in the a.m. Patient treated with oxycodone  for pain Admission requested for pain control    Assessment & Plan: Tina Page is a 68 y.o. 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    Nondisplaced fracture of greater trochanter of right femur, initial encounter for closed fracture (HCC) Accidental fall --ortho consulted, rec conservative management --50% protected weightbearing with assistive devices at all times  --hip abduction brace --follow-up next week for repeat  x-rays in ortho office   AMS Resolved.  Likely due to dehydration and worsening renal function.  ABG with normal pCO2.   AKI on CKD (chronic kidney disease) stage 3a Baseline around 1.5.  Peak 3.43    Latest Ref Rng & Units 05/20/2024    4:16 AM 05/19/2024    6:02 AM 05/18/2024    6:46 PM  BMP  Glucose 70 - 99 mg/dL 90  88  890   BUN 8 - 23 mg/dL 37  44  54   Creatinine 0.44 - 1.00 mg/dL 8.29  8.01  7.27   Sodium 135 - 145 mmol/L 137  138  134   Potassium 3.5 - 5.1 mmol/L 4.3  4.3  3.9   Chloride 98 - 111 mmol/L 100  104  100   CO2 22 - 32 mmol/L 28  26  26    Calcium  8.9 - 10.3 mg/dL 9.0  8.8  8.8   Hold further fluids, continue to encourage p.o. intake.   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  Discussed with Tina Page who did not feel this is related to hemolysis. - Recommended patient be transfused with 1 unit of packed red blood cells, awaiting for compatible unit  PAD (peripheral artery disease) (HCC) History of lower extremity vascular stents --cont statin  COPD (chronic obstructive pulmonary disease) (HCC) Not acutely exacerbated  Essential hypertension Hold home lisinopril  HCTZ given low normal blood pressures and AKI Hypothyroidism continue home levothyroxine   Class I Obesity  BMI 32.06 complicates care   DVT prophylaxis: Lovenox  SQ Code Status: Full  code  Family Communication: family updated at bedside today Level of care: Med-Surg Dispo:   The patient is from: home Anticipated d/c is to: SNF Anticipated d/c date is: when disposition found   Subjective and Interval History:  No issues this a.m.  Patient more alert and mentation improved.  Objective: Vitals:   05/19/24 1830 05/20/24 0341 05/20/24 0737 05/20/24 1607  BP:  (!) 116/43 (!) 125/53 (!)  130/51  Pulse:  74 72 74  Resp:  18 17 17   Temp:  98.2 F (36.8 C) 98.7 F (37.1 C) 98.4 F (36.9 C)  TempSrc:   Oral Oral  SpO2: (P) 90% 100% 100% 100%  Weight:      Height:        Intake/Output Summary (Last 24 hours) at 05/20/2024 1859 Last data filed at 05/20/2024 1639 Gross per 24 hour  Intake 640 ml  Output 600 ml  Net 40 ml   Filed Weights   05/15/24 1831 05/16/24 0500  Weight: 82.1 kg 82.1 kg      Constitutional: In no distress.  Cardiovascular: Normal rate, regular rhythm. No lower extremity edema  Pulmonary: Non labored breathing on [place], no wheezing or rales.   Abdominal: Soft. Non distended and non tender Musculoskeletal: Normal range of motion.     Neurological: Alert and oriented to person, place, and time. Non focal  Skin: Skin is warm and dry.    Data Reviewed: I have personally reviewed labs and imaging studies  Time spent: 35 minutes  Tina Pepper, MD [facility] If 7PM-7AM, please contact night-coverage 05/20/2024, 6:59 PM

## 2024-05-20 NOTE — Care Plan (Signed)
 Ms. Tina Page from blood bank  patient's antibody screen is positive  MD Franchot made aware.  Consent signed and family med aware of the finding. Per family they have had to wait before due to the same issues.  Tiffanie Charge nurse made aware as well

## 2024-05-20 NOTE — Progress Notes (Signed)
 Physical Therapy Treatment Patient Details Name: Tina Page MRN: [id number] DOB: September 06, 1955 Today's Date: 05/20/2024   History of Present Illness Pt is 68 year old female presented to ED after fall admitted with Right greater trochanter avulsion fracture with plans to manage non operatively with hip abduction brace at this time     PMH significant for HTN, COPD, thrombocytopenia, hypothyroidism, HLD, anemia and hepatitis C    PT Comments  Pt making gradual progress with  bed mobility, performing supine to sit at Mod A level.  During standing balance and gait training pt required mod cues for technique to maintain R PWB; pt able to follow instructions with verbal and manual cues. 3-4 steps forward, 3-4 steps backwards x2. Pt on RA, SPO2 88- 96% cues for not holding breath.  Continued PT will assist pt towards greater dynamic standing balance, LE strengthening, and activity tolerance to increase safety and independence and decrease burden of care with functional mobility.    If plan is discharge home, recommend the following: Two people to help with walking and/or transfers;Two people to help with bathing/dressing/bathroom;Assistance with cooking/housework;Assist for transportation;Help with stairs or ramp for entrance   Can travel by private vehicle     No  Equipment Recommendations  Other (comment) (TBD)    Recommendations for Other Services       Precautions / Restrictions Precautions Precautions: Fall Recall of Precautions/Restrictions: Intact Required Braces or Orthoses: Other Brace Other Brace: hip abduction brace when OOB Restrictions Weight Bearing Restrictions Per Provider Order: Yes RLE Weight Bearing Per Provider Order: Partial weight bearing RLE Partial Weight Bearing Percentage or Pounds: 50 Other Position/Activity Restrictions: hip abduction brace when OOB     Mobility  Bed Mobility Overal bed mobility: Needs Assistance Bed Mobility: Supine to Sit, Sit to  Supine     Supine to sit: Mod assist Sit to supine: Mod assist, +2 for physical assistance   General bed mobility comments: cues for sequencing    Transfers   Equipment used: Rolling walker (2 wheels) Transfers: Sit to/from Stand Sit to Stand: Mod assist, +2 physical assistance, From elevated surface           General transfer comment: mod cues for technique to maintain R PWB pt able to follow instructions with verbal and manual cues.    Ambulation/Gait Ambulation/Gait assistance: Mod assist, +2 physical assistance Gait Distance (Feet): 3 Feet (+3) Assistive device: Rolling walker (2 wheels) Gait Pattern/deviations: Step-through pattern, Trunk flexed Gait velocity: decreased     General Gait Details: mod cues for technique to maintain R PWB pt able to follow instructions with verbal and manual cues.  3-4 steps forward, 3-4 steps backwards x2.  Pt on RA, SPO2 88- 96% cues for not holding breath.   Stairs             Wheelchair Mobility     Tilt Bed    Modified Rankin (Stroke Patients Only)       Balance Overall balance assessment: Needs assistance Sitting-balance support: Feet supported Sitting balance-Leahy Scale: Fair Sitting balance - Comments: supervision level, pt would intermittently make a jumping kind of motion (possibly due to pain) and then self corrected imbalance   Standing balance support: Bilateral upper extremity supported, During functional activity, Reliant on assistive device for balance Standing balance-Leahy Scale: Poor Standing balance comment: heavy BUE support, difficulty WB RLE  Communication Communication Communication: Impaired Factors Affecting Communication: Reduced clarity of speech  Cognition Arousal: Lethargic Behavior During Therapy: Flat affect   PT - Cognitive impairments: No apparent impairments                       PT - Cognition Comments: Required Min cues to keep  eyes open during session and making appropriate responses to instruction. Following commands: Impaired Following commands impaired: Follows one step commands with increased time, Follows multi-step commands inconsistently    Cueing Cueing Techniques: Verbal cues, Tactile cues, Gestural cues, Visual cues  Exercises      General Comments        Pertinent Vitals/Pain Pain Assessment Pain Score: 5  Pain Location: R hip Pain Descriptors / Indicators: Grimacing, Aching Pain Intervention(s): Monitored during session, Limited activity within patient's tolerance, Premedicated before session    Home Living                          Prior Function            PT Goals (current goals can now be found in the care plan section) Acute Rehab PT Goals Patient Stated Goal: none stated- mostly non verbal PT Goal Formulation: With patient Time For Goal Achievement: 05/31/24 Potential to Achieve Goals: Fair Progress towards PT goals: Progressing toward goals    Frequency    7X/week      PT Plan      Co-evaluation              AM-PAC PT 6 Clicks Mobility   Outcome Measure  Help needed turning from your back to your side while in a flat bed without using bedrails?: A Lot Help needed moving from lying on your back to sitting on the side of a flat bed without using bedrails?: Total Help needed moving to and from a bed to a chair (including a wheelchair)?: Total Help needed standing up from a chair using your arms (e.g., wheelchair or bedside chair)?: Total Help needed to walk in hospital room?: Total Help needed climbing 3-5 steps with a railing? : Total 6 Click Score: 7    End of Session Equipment Utilized During Treatment: Gait belt Activity Tolerance: Patient limited by fatigue Patient left: with call bell/phone within reach;with family/visitor present;in bed;with bed alarm set Nurse Communication: Mobility status PT Visit Diagnosis: Unsteadiness on feet  (R26.81);Muscle weakness (generalized) (M62.81);History of falling (Z91.81);Difficulty in walking, not elsewhere classified (R26.2);Pain Pain - Right/Left: Right Pain - part of body: Hip     Time: 8687-8661 PT Time Calculation (min) (ACUTE ONLY): 26 min  Charges:    $Therapeutic Activity: 23-37 mins PT General Charges $$ ACUTE PT VISIT: 1 Visit                     Harland Irving, PTA  05/20/24, 3:38 PM

## 2024-05-21 DIAGNOSIS — S72101G Unspecified trochanteric fracture of right femur, subsequent encounter for closed fracture with delayed healing: Secondary | ICD-10-CM | POA: Diagnosis not present

## 2024-05-21 DIAGNOSIS — N179 Acute kidney failure, unspecified: Secondary | ICD-10-CM | POA: Diagnosis not present

## 2024-05-21 LAB — CBC
HCT: 21.7 % — ABNORMAL LOW (ref 36.0–46.0)
Hemoglobin: 7.1 g/dL — ABNORMAL LOW (ref 12.0–15.0)
MCH: 35.5 pg — ABNORMAL HIGH (ref 26.0–34.0)
MCHC: 32.7 g/dL (ref 30.0–36.0)
MCV: 108.5 fL — ABNORMAL HIGH (ref 80.0–100.0)
Platelets: 119 K/uL — ABNORMAL LOW (ref 150–400)
RBC: 2 MIL/uL — ABNORMAL LOW (ref 3.87–5.11)
RDW: 15.6 % — ABNORMAL HIGH (ref 11.5–15.5)
WBC: 3.7 K/uL — ABNORMAL LOW (ref 4.0–10.5)
nRBC: 0 % (ref 0.0–0.2)

## 2024-05-21 LAB — BASIC METABOLIC PANEL WITH GFR
Anion gap: 7 (ref 5–15)
BUN: 31 mg/dL — ABNORMAL HIGH (ref 8–23)
CO2: 31 mmol/L (ref 22–32)
Calcium: 9.3 mg/dL (ref 8.9–10.3)
Chloride: 100 mmol/L (ref 98–111)
Creatinine, Ser: 1.56 mg/dL — ABNORMAL HIGH (ref 0.44–1.00)
GFR, Estimated: 36 mL/min — ABNORMAL LOW (ref >60)
Glucose, Bld: 91 mg/dL (ref 70–99)
Potassium: 4.3 mmol/L (ref 3.5–5.1)
Sodium: 138 mmol/L (ref 135–145)

## 2024-05-21 LAB — TSH: TSH: 6.98 u[IU]/mL — ABNORMAL HIGH (ref 0.350–4.500)

## 2024-05-21 NOTE — Plan of Care (Signed)

## 2024-05-21 NOTE — Progress Notes (Signed)
 Physical Therapy Treatment Patient Details Name: Tina Page MRN: [id number] DOB: 12/07/1955 Today's Date: 05/21/2024   History of Present Illness Pt is 68 year old female presented to ED after fall admitted with Right greater trochanter avulsion fracture with plans to manage non operatively with hip abduction brace at this time     PMH significant for HTN, COPD, thrombocytopenia, hypothyroidism, HLD, anemia and hepatitis C    PT Comments  Pt ready for session.  In chair prior with nursing.  Abd hip brace donned. She is able to stand with min a x 1 from recliner and weight shift left/right with RW assist for PWB.  She progresses gait 10' x 2 in room with RW and min a x 1 with daughter assisting with chair follow.  Limited by general fatigue.  Overall good progress during session today.  Remained up with family and needs met.   If plan is discharge home, recommend the following: Assistance with cooking/housework;Assist for transportation;Help with stairs or ramp for entrance;A little help with walking and/or transfers;A little help with bathing/dressing/bathroom   Can travel by private vehicle        Equipment Recommendations  Rolling walker (2 wheels);BSC/3in1    Recommendations for Other Services       Precautions / Restrictions Precautions Precautions: Fall Recall of Precautions/Restrictions: Intact Required Braces or Orthoses: Other Brace Other Brace: hip abduction brace when OOB Restrictions Weight Bearing Restrictions Per Provider Order: Yes RLE Weight Bearing Per Provider Order: Partial weight bearing RLE Partial Weight Bearing Percentage or Pounds: 50 Other Position/Activity Restrictions: hip abduction brace when OOB     Mobility  Bed Mobility               General bed mobility comments: in chair before and after Patient Response: Cooperative  Transfers Overall transfer level: Needs assistance Equipment used: Rolling walker (2 wheels) Transfers: Sit  to/from Stand Sit to Stand: Min assist                Ambulation/Gait Ambulation/Gait assistance: Min assist, +2 safety/equipment Gait Distance (Feet): 10 Feet Assistive device: Rolling walker (2 wheels) Gait Pattern/deviations: Step-through pattern, Trunk flexed Gait velocity: decreased     General Gait Details: 10' x 2.  daugher provides chair follow   Stairs             Wheelchair Mobility     Tilt Bed Tilt Bed Patient Response: Cooperative  Modified Rankin (Stroke Patients Only)       Balance Overall balance assessment: Needs assistance Sitting-balance support: Feet supported Sitting balance-Leahy Scale: Fair     Standing balance support: Bilateral upper extremity supported, During functional activity, Reliant on assistive device for balance Standing balance-Leahy Scale: Fair Standing balance comment: heavy BUE support, improved ability to step today with RW support and cues for [facility]                            Communication Communication Communication: Impaired Factors Affecting Communication: Reduced clarity of speech  Cognition Arousal: Alert Behavior During Therapy: Flat affect   PT - Cognitive impairments: No apparent impairments                         Following commands: Impaired Following commands impaired: Follows one step commands with increased time    Cueing Cueing Techniques: Verbal cues, Tactile cues, Gestural cues, Visual cues  Exercises      General Comments  Pertinent Vitals/Pain Pain Assessment Pain Assessment: Faces Faces Pain Scale: Hurts little more Pain Location: R hip Pain Descriptors / Indicators: Grimacing, Aching Pain Intervention(s): Limited activity within patient's tolerance, Monitored during session, Premedicated before session, Repositioned    Home Living                          Prior Function            PT Goals (current goals can now be found in the care  plan section) Progress towards PT goals: Progressing toward goals    Frequency    7X/week      PT Plan      Co-evaluation              AM-PAC PT 6 Clicks Mobility   Outcome Measure  Help needed turning from your back to your side while in a flat bed without using bedrails?: A Lot Help needed moving from lying on your back to sitting on the side of a flat bed without using bedrails?: A Lot Help needed moving to and from a bed to a chair (including a wheelchair)?: A Little Help needed standing up from a chair using your arms (e.g., wheelchair or bedside chair)?: A Little Help needed to walk in hospital room?: A Little Help needed climbing 3-5 steps with a railing? : Total 6 Click Score: 14    End of Session Equipment Utilized During Treatment: Gait belt;Other (comment) (hip abd brace donned throughout) Activity Tolerance: Patient limited by fatigue Patient left: with call bell/phone within reach;with family/visitor present;in chair;with chair alarm set Nurse Communication: Mobility status PT Visit Diagnosis: Unsteadiness on feet (R26.81);Muscle weakness (generalized) (M62.81);History of falling (Z91.81);Difficulty in walking, not elsewhere classified (R26.2);Pain Pain - Right/Left: Right Pain - part of body: Hip     Time: 8593-8580 PT Time Calculation (min) (ACUTE ONLY): 13 min  Charges:    $Gait Training: 8-22 mins PT General Charges $$ ACUTE PT VISIT: 1 Visit                   Lauraine Gills, PTA 05/21/24, 4:04 PM

## 2024-05-21 NOTE — Progress Notes (Signed)
 PROGRESS NOTE    Tina Page  FMW:[id number] DOB: 1955-10-21 DOA: 05/15/2024 PCP: Adina Buel HERO, MD  153A/[id number]-AA  LOS: 4 days   Brief hospital course: Per H&P HPI: Tina Page is a 68 y.o. 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  She was previously in her usual state of health and denies preceding lightheadedness, chest pain, palpitations, shortness of breath, headache, visual disturbance, one-sided weakness numbness or tingling. In the ED vitals within normal limits Labs notable for baseline hemoglobin of 9.2 and baseline creatinine of 1.5 EKG with NSR at 76 CT hip showed findings suspicious for nondisplaced fracture through the greater trochanter without intertrochanteric involvement. The ED provider spoke with orthopedist Dr. Rolla who advised likely nonoperative but will see patient in the a.m. Patient treated with oxycodone  for pain Admission requested for pain control    Assessment & Plan: Tina Page is a 68 y.o. 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    Nondisplaced fracture of greater trochanter of right femur, initial encounter for closed fracture (HCC) Accidental fall --ortho consulted, rec conservative management --50% protected weightbearing with assistive devices at all times  --hip abduction brace --follow-up next week for repeat  x-rays in ortho office   AMS Resolved.  Likely due to dehydration and worsening renal function.     AKI on CKD (chronic kidney disease) stage 3a Baseline around 1.5.  [facility] 3.43    Latest Ref Rng & Units 05/21/2024    5:11 AM 05/20/2024    4:16 AM 05/19/2024    6:02 AM  BMP  Glucose 70 - 99 mg/dL 91  90  88   BUN 8 - 23 mg/dL 31  37  44   Creatinine 0.44 - 1.00 mg/dL 8.43  8.29  8.01   Sodium 135 - 145 mmol/L 138  137  138   Potassium 3.5 - 5.1 mmol/L 4.3  4.3  4.3   Chloride 98 - 111 mmol/L 100  100  104   CO2 22 - 32 mmol/L 31  28  26    Calcium  8.9 - 10.3 mg/dL 9.3  9.0  8.8   Appears to be at baseline.  P.o. 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  Discussed with Dr. Babara who did not feel this is related to hemolysis. - Recommended patient be transfused with 1 unit of packed red blood cells, awaiting for compatible unit     Latest Ref Rng & Units 05/21/2024    5:11 AM 05/20/2024    4:16 AM 05/19/2024    6:02 AM  CBC  WBC 4.0 - 10.5 K/uL 3.7  4.8  4.6   Hemoglobin 12.0 - 15.0 g/dL 7.1  7.1  7.0   Hematocrit 36.0 - 46.0 % 21.7  21.8  22.0   Platelets 150 - 400 K/uL 119  121  114   Hemoglobin stable  Thrombocytopenia  Platelets decreased Stable. Unclear etiology Continue to monitor    PAD (peripheral artery disease) (HCC) History of lower extremity vascular stents --cont statin  COPD (chronic obstructive pulmonary disease) (HCC) Not in acute exacerbation  Essential hypertension Hold home lisinopril  HCTZ given low normal blood pressures and AKI. Can likely resume lisinopril  in a.m.  Hypothyroidism continue home levothyroxine   Class I Obesity  BMI 32.06 complicates care   DVT prophylaxis: Lovenox  SQ Code Status: Full code  Family  Communication: family updated at bedside today Level of care: Med-Surg Dispo:   The patient is from: home Anticipated d/c is to: SNF Anticipated d/c date is: when disposition found   Subjective and Interval History:  No issues overnight.  Objective: Vitals:   05/21/24 0505 05/21/24 0626 05/21/24 0756 05/21/24 1005  BP: (!) 120/39 (!) 123/56 (!) 147/56 (!) 154/62  Pulse: (!) 57 66 60 67  Resp: 14 16 17 18   Temp: 97.8 F (36.6 C) 98.2 F (36.8 C) 98.1 F (36.7 C) 98.7 F (37.1 C)  TempSrc: Oral Oral Oral Oral  SpO2: 100% 100% 100% 98%  Weight:      Height:        Intake/Output Summary (Last 24 hours) at 05/21/2024 1800 Last data filed at 05/21/2024 1100 Gross per 24 hour  Intake 655 ml  Output 1300 ml  Net -645 ml   Filed Weights   05/15/24 1831 05/16/24 0500  Weight: 82.1 kg 82.1 kg      Physical Exam  Constitutional: In no distress.  Cardiovascular: Normal rate, regular rhythm. No lower extremity edema  Pulmonary: Non labored breathing on room air, no wheezing or rales.   Abdominal: Soft. Non distended and non tender Musculoskeletal: Right lower extremity movement limited due to pain, can wiggle toes sensation is intact Neurological: Alert and oriented to person, place, and time.  Skin: Skin is warm and dry.     Data Reviewed: I have personally reviewed labs and imaging studies  Time spent: 35 minutes  Alban Pepper, MD [facility] If 7PM-7AM, please contact night-coverage 05/21/2024, 6:00 PM

## 2024-05-22 DIAGNOSIS — S72101G Unspecified trochanteric fracture of right femur, subsequent encounter for closed fracture with delayed healing: Secondary | ICD-10-CM | POA: Diagnosis not present

## 2024-05-22 DIAGNOSIS — N179 Acute kidney failure, unspecified: Secondary | ICD-10-CM | POA: Diagnosis not present

## 2024-05-22 LAB — CBC
HCT: 30.3 % — ABNORMAL LOW (ref 36.0–46.0)
Hemoglobin: 9.9 g/dL — ABNORMAL LOW (ref 12.0–15.0)
MCH: 33.6 pg (ref 26.0–34.0)
MCHC: 32.7 g/dL (ref 30.0–36.0)
MCV: 102.7 fL — ABNORMAL HIGH (ref 80.0–100.0)
Platelets: 171 K/uL (ref 150–400)
RBC: 2.95 MIL/uL — ABNORMAL LOW (ref 3.87–5.11)
RDW: 17.1 % — ABNORMAL HIGH (ref 11.5–15.5)
WBC: 3.4 K/uL — ABNORMAL LOW (ref 4.0–10.5)
nRBC: 0 % (ref 0.0–0.2)

## 2024-05-22 LAB — BASIC METABOLIC PANEL WITH GFR
Anion gap: 11 (ref 5–15)
BUN: 28 mg/dL — ABNORMAL HIGH (ref 8–23)
CO2: 29 mmol/L (ref 22–32)
Calcium: 9.8 mg/dL (ref 8.9–10.3)
Chloride: 99 mmol/L (ref 98–111)
Creatinine, Ser: 1.34 mg/dL — ABNORMAL HIGH (ref 0.44–1.00)
GFR, Estimated: 44 mL/min — ABNORMAL LOW (ref >60)
Glucose, Bld: 84 mg/dL (ref 70–99)
Potassium: 4 mmol/L (ref 3.5–5.1)
Sodium: 139 mmol/L (ref 135–145)

## 2024-05-22 MED ORDER — HYDROCHLOROTHIAZIDE 25 MG PO TABS
25.0000 mg | ORAL_TABLET | Freq: Every day | ORAL | Status: DC
  Administered 2024-05-22 – 2024-05-23 (×2): 25 mg via ORAL
  Filled 2024-05-22 (×2): qty 1

## 2024-05-22 MED ORDER — LISINOPRIL-HYDROCHLOROTHIAZIDE 20-25 MG PO TABS: 1.0000 | ORAL_TABLET | Freq: Every day | ORAL | Status: DC

## 2024-05-22 MED ORDER — LISINOPRIL 20 MG PO TABS
20.0000 mg | ORAL_TABLET | Freq: Every day | ORAL | Status: DC
  Administered 2024-05-22 – 2024-05-23 (×2): 20 mg via ORAL
  Filled 2024-05-22 (×2): qty 1

## 2024-05-22 MED ORDER — CLOPIDOGREL BISULFATE 75 MG PO TABS
75.0000 mg | ORAL_TABLET | Freq: Every day | ORAL | Status: DC
  Administered 2024-05-23: 75 mg via ORAL
  Filled 2024-05-22: qty 1

## 2024-05-22 NOTE — Progress Notes (Signed)
 Physical Therapy Treatment Patient Details Name: Tina Page MRN: [id number] DOB: November 03, 1955 Today's Date: 05/22/2024   History of Present Illness Pt is 67 year old female presented to ED after fall admitted with Right greater trochanter avulsion fracture with plans to manage non operatively with hip abduction brace at this time     PMH significant for HTN, COPD, thrombocytopenia, hypothyroidism, HLD, anemia and hepatitis C    PT Comments  Pt in chair with brace donned.  She has been walking to/from bathroom with staff today and does so again during session.  Overall good improvement during session.  Painful transitions remain but improving.  Decreased step height and length bilaterally.  Remained up in chair and encouraged to keep walking with staff to bathroom as able.  Discharge recommendations remain appropriate.     If plan is discharge home, recommend the following: Assistance with cooking/housework;Assist for transportation;Help with stairs or ramp for entrance;A little help with walking and/or transfers;A little help with bathing/dressing/bathroom   Can travel by private vehicle        Equipment Recommendations  Rolling walker (2 wheels);BSC/3in1    Recommendations for Other Services       Precautions / Restrictions Precautions Precautions: Fall Recall of Precautions/Restrictions: Intact Required Braces or Orthoses: Other Brace Other Brace: hip abduction brace when OOB Restrictions Weight Bearing Restrictions Per Provider Order: Yes RLE Weight Bearing Per Provider Order: Partial weight bearing RLE Partial Weight Bearing Percentage or Pounds: 50 Other Position/Activity Restrictions: hip abduction brace when OOB     Mobility  Bed Mobility                 Patient Response: Cooperative  Transfers Overall transfer level: Needs assistance Equipment used: Rolling walker (2 wheels) Transfers: Sit to/from Stand Sit to Stand: Contact guard assist                 Ambulation/Gait Ambulation/Gait assistance: Contact guard assist Gait Distance (Feet): 15 Feet Assistive device: Rolling walker (2 wheels) Gait Pattern/deviations: Step-through pattern, Trunk flexed Gait velocity: decreased     General Gait Details: 15' x 2 to and brom bathroom to void   Optometrist     Tilt Bed Tilt Bed Patient Response: Cooperative  Modified Rankin (Stroke Patients Only)       Balance Overall balance assessment: Needs assistance Sitting-balance support: Feet supported Sitting balance-Leahy Scale: Good       Standing balance-Leahy Scale: Fair                              Hotel manager: No apparent difficulties  Cognition Arousal: Alert Behavior During Therapy: WFL for tasks assessed/performed   PT - Cognitive impairments: No apparent impairments                         Following commands: Intact Following commands impaired: Follows one step commands with increased time    Cueing Cueing Techniques: Verbal cues, Tactile cues, Gestural cues, Visual cues  Exercises      General Comments        Pertinent Vitals/Pain Pain Assessment Pain Assessment: Faces Faces Pain Scale: Hurts a little bit Pain Location: mostly with transitions Pain Descriptors / Indicators: Grimacing, Aching Pain Intervention(s): Limited activity within patient's tolerance, Monitored during session, Repositioned    Home Living  Prior Function            PT Goals (current goals can now be found in the care plan section) Progress towards PT goals: Progressing toward goals    Frequency    7X/week      PT Plan      Co-evaluation              AM-PAC PT 6 Clicks Mobility   Outcome Measure  Help needed turning from your back to your side while in a flat bed without using bedrails?: A Lot Help needed moving from lying on your  back to sitting on the side of a flat bed without using bedrails?: A Lot Help needed moving to and from a bed to a chair (including a wheelchair)?: A Little Help needed standing up from a chair using your arms (e.g., wheelchair or bedside chair)?: A Little Help needed to walk in hospital room?: A Little Help needed climbing 3-5 steps with a railing? : Total 6 Click Score: 14    End of Session Equipment Utilized During Treatment: Gait belt;Other (comment) Activity Tolerance: Patient tolerated treatment well Patient left: with call bell/phone within reach;with family/visitor present;in chair;with chair alarm set Nurse Communication: Mobility status PT Visit Diagnosis: Unsteadiness on feet (R26.81);Muscle weakness (generalized) (M62.81);History of falling (Z91.81);Difficulty in walking, not elsewhere classified (R26.2);Pain Pain - Right/Left: Right Pain - part of body: Hip     Time: 8456-8444 PT Time Calculation (min) (ACUTE ONLY): 12 min  Charges:    $Gait Training: 8-22 mins PT General Charges $$ ACUTE PT VISIT: 1 Visit                   Lauraine Gills, PTA 05/22/24, 4:05 PM

## 2024-05-22 NOTE — Progress Notes (Signed)
 Occupational Therapy Treatment Patient Details Name: Tina Page MRN: [id number] DOB: 23-Dec-1955 Today's Date: 05/22/2024   History of present illness Pt is 68 year old female presented to ED after fall admitted with Right greater trochanter avulsion fracture with plans to manage non operatively with hip abduction brace at this time     PMH significant for HTN, COPD, thrombocytopenia, hypothyroidism, HLD, anemia and hepatitis C   OT comments  Ms Jiron was seen for OT treatment on this date. Upon arrival to room pt in bed, agreeable to tx, requesting bathing assistance. Pt requires MOD A for LB bathing, SETUP for UB bathing in sitting. MAX A don/doff hip abd brace in standing. CGA + RW for ADL t/f ~25 ft. Pt making good progress toward goals, will continue to follow POC. Discharge recommendation remains appropriate.        If plan is discharge home, recommend the following:  A lot of help with bathing/dressing/bathroom;Two people to help with walking and/or transfers   Equipment Recommendations  Other (comment) (defer)    Recommendations for Other Services      Precautions / Restrictions Precautions Precautions: Fall Recall of Precautions/Restrictions: Intact Restrictions Weight Bearing Restrictions Per Provider Order: Yes RLE Weight Bearing Per Provider Order: Partial weight bearing RLE Partial Weight Bearing Percentage or Pounds: 50 Other Position/Activity Restrictions: hip abduction brace when OOB       Mobility Bed Mobility Overal bed mobility: Needs Assistance Bed Mobility: Supine to Sit     Supine to sit: Supervision          Transfers Overall transfer level: Needs assistance Equipment used: Rolling walker (2 wheels) Transfers: Sit to/from Stand Sit to Stand: Contact guard assist                 Balance Overall balance assessment: Needs assistance Sitting-balance support: Feet supported Sitting balance-Leahy Scale: Good     Standing balance  support: Bilateral upper extremity supported Standing balance-Leahy Scale: Fair                             ADL either performed or assessed with clinical judgement   ADL Overall ADL's : Needs assistance/impaired                                       General ADL Comments: MOD A for LB bathing, SETUP for UB bathing in sitting. MAX A don/doff hip abd brace in standing     Communication Communication Communication: No apparent difficulties   Cognition Arousal: Alert Behavior During Therapy: WFL for tasks assessed/performed Cognition: No apparent impairments                               Following commands: Intact        Cueing      Exercises      Shoulder Instructions       General Comments      Pertinent Vitals/ Pain       Pain Assessment Pain Assessment: Faces Faces Pain Scale: Hurts little more Pain Location: R hip Pain Descriptors / Indicators: Grimacing, Aching Pain Intervention(s): Limited activity within patient's tolerance, Repositioned   Frequency  Min 2X/week        Progress Toward Goals  OT Goals(current goals can now be found in the care plan  section)  Progress towards OT goals: Progressing toward goals  Acute Rehab OT Goals Patient Stated Goal: to go home OT Goal Formulation: With patient Time For Goal Achievement: 05/30/24 Potential to Achieve Goals: Good ADL Goals Pt Will Perform Grooming: with modified independence;sitting Pt Will Perform Lower Body Dressing: with modified independence;sitting/lateral leans;sit to/from stand Pt Will Transfer to Toilet: with modified independence;ambulating Pt Will Perform Toileting - Clothing Manipulation and hygiene: with modified independence;sitting/lateral leans;sit to/from stand  Plan      Co-evaluation                 AM-PAC OT 6 Clicks Daily Activity     Outcome Measure   Help from another person eating meals?: None Help from another  person taking care of personal grooming?: A Little Help from another person toileting, which includes using toliet, bedpan, or urinal?: A Lot Help from another person bathing (including washing, rinsing, drying)?: A Lot Help from another person to put on and taking off regular upper body clothing?: None Help from another person to put on and taking off regular lower body clothing?: A Lot 6 Click Score: 17    End of Session Equipment Utilized During Treatment: Rolling walker (2 wheels)  OT Visit Diagnosis: Other abnormalities of gait and mobility (R26.89);Muscle weakness (generalized) (M62.81)   Activity Tolerance Patient tolerated treatment well   Patient Left in chair;with call bell/phone within reach;with chair alarm set;with family/visitor present   Nurse Communication          Time: 8975-8947 OT Time Calculation (min): 28 min  Charges: OT General Charges $OT Visit: 1 Visit OT Treatments $Self Care/Home Management : 23-37 mins  Elston Slot, M.S. OTR/L  05/22/24, 12:18 PM  ascom [phone]

## 2024-05-22 NOTE — Plan of Care (Signed)
  Problem: Education: Goal: Knowledge of General Education information will improve Description: Including pain rating scale, medication(s)/side effects and non-pharmacologic comfort measures Outcome: Progressing   Problem: Activity: Goal: Risk for activity intolerance will decrease Outcome: Progressing   Problem: Coping: Goal: Level of anxiety will decrease Outcome: Progressing   Problem: Nutrition: Goal: Adequate nutrition will be maintained Outcome: Progressing   Problem: Pain Managment: Goal: General experience of comfort will improve and/or be controlled Outcome: Progressing

## 2024-05-22 NOTE — TOC Progression Note (Signed)
 Transition of Care [facility]) - Progression Note    Patient Details  Name: Tina Page MRN: [id number] Date of Birth: [birth year]  Transition of [facility]) CM/SW Contact  Zackaria Burkey  [place], [place] Phone Number: 05/22/2024, 4:38 PM  Clinical Narrative:   [facility] approved for patient to admit to [facility] approved Certified in Total Certification # [id number]   [facility] to follow for discharge                      Expected Discharge Plan and Services                                               Social Drivers of Health (SDOH) Interventions SDOH Screenings   Food Insecurity: No Food Insecurity (05/16/2024)  Housing: Unknown (05/16/2024)  Transportation Needs: No Transportation Needs (05/16/2024)  Utilities: Not At Risk (05/16/2024)  Social Connections: Unknown (05/16/2024)  Tobacco Use: Medium Risk (05/15/2024)    Readmission Risk Interventions     No data to display

## 2024-05-22 NOTE — Plan of Care (Signed)

## 2024-05-22 NOTE — Progress Notes (Signed)
 PROGRESS NOTE    Tina Page  FMW:[id number] DOB: 1955-10-03 DOA: 05/15/2024 PCP: Adina Buel HERO, MD  [id number]  LOS: 5 days   Brief hospital course: Per H&P HPI: Tina Page is a 68 y.o. 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  She was previously in her usual state of health and denies preceding lightheadedness, chest pain, palpitations, shortness of breath, headache, visual disturbance, one-sided weakness numbness or tingling. In the ED vitals within normal limits Labs notable for baseline hemoglobin of 9.2 and baseline creatinine of 1.5 EKG with NSR at 76 CT hip showed findings suspicious for nondisplaced fracture through the greater trochanter without intertrochanteric involvement. The ED provider spoke with orthopedist Dr. Rolla who advised likely nonoperative but will see patient in the a.m. Patient treated with oxycodone  for pain Admission requested for pain control    Assessment & Plan: Tina Page is a 68 y.o. 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    Nondisplaced fracture of greater trochanter of right femur, initial encounter for closed fracture (HCC) Accidental fall --ortho consulted, rec conservative management --50% protected weightbearing with assistive devices at all times  --hip abduction brace --follow-up next week for repeat  x-rays in ortho office      AKI on CKD (chronic kidney disease) stage 3a Baseline around 1.5.  [facility] 3.43    Latest Ref Rng & Units 05/22/2024    8:39 AM 05/21/2024    5:11 AM 05/20/2024    4:16 AM  BMP  Glucose 70 - 99 mg/dL 84  91  90   BUN 8 - 23 mg/dL 28  31  37   Creatinine 0.44 - 1.00 mg/dL 8.65  8.43  8.29   Sodium 135 - 145 mmol/L 139  138  137   Potassium 3.5 - 5.1 mmol/L 4.0  4.3  4.3   Chloride 98 - 111 mmol/L 99  100  100   CO2 22 - 32 mmol/L 29  31  28    Calcium  8.9 - 10.3 mg/dL 9.8  9.3  9.0   Appears to be at baseline.  P.o. 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  Discussed with Dr. Babara who did not feel this is related to hemolysis. - Recommended patient be transfused with 1 unit of packed red blood cells,  She is s/p 1 u and repsonded approriately       Latest Ref Rng & Units 05/22/2024    8:39 AM 05/21/2024    5:11 AM 05/20/2024    4:16 AM  CBC  WBC 4.0 - 10.5 K/uL 3.4  3.7  4.8   Hemoglobin 12.0 - 15.0 g/dL 9.9  7.1  7.1   Hematocrit 36.0 - 46.0 % 30.3  21.7  21.8  Platelets 150 - 400 K/uL 171  119  121   Hemoglobin stable  Thrombocytopenia  Resolved   AMS Resolved   PAD (peripheral artery disease) (HCC) History of lower extremity vascular stents --cont statin  COPD (chronic obstructive pulmonary disease) (HCC) Not in acute exacerbation  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  Objective: Vitals:   05/22/24 0529 05/22/24 0941 05/22/24 1729 05/22/24 1729  BP: (!) 155/72 (!) 166/62 (!) 161/70 (!) 161/70  Pulse: 61 71 76 77  Resp: 18 16 18 18   Temp: 98.6 F (37 C) 99 F (37.2 C) 98.4 F (36.9 C) 98.4 F (36.9 C)  TempSrc:  Oral Oral Oral  SpO2: 98% 95% 97% 95%  Weight:      Height:        Intake/Output Summary (Last 24 hours) at 05/22/2024 1942 Last data filed at 05/22/2024 1139 Gross per 24 hour  Intake 0 ml  Output 1700 ml  Net -1700 ml   Filed Weights   05/15/24 1831 05/16/24 0500  Weight: 82.1 kg 82.1 kg       Constitutional: In no distress.  Cardiovascular: Normal rate, regular rhythm. No lower extremity edema  Pulmonary: Non labored breathing on room air, no wheezing or rales.   Abdominal: Soft. Non distended and non tender Musculoskeletal: Right lower extremity movement limited due to pain Neurological: Alert and oriented to person, place, and time. Non focal  Skin: Skin is warm and dry.    Data Reviewed: I have personally reviewed labs and imaging studies  Time spent: 35 minutes  Alban Pepper, MD [facility] If 7PM-7AM, please contact night-coverage 05/22/2024, 7:42 PM

## 2024-05-23 DIAGNOSIS — S72101G Unspecified trochanteric fracture of right femur, subsequent encounter for closed fracture with delayed healing: Secondary | ICD-10-CM | POA: Diagnosis not present

## 2024-05-23 LAB — BASIC METABOLIC PANEL WITH GFR
Anion gap: 12 (ref 5–15)
BUN: 32 mg/dL — ABNORMAL HIGH (ref 8–23)
CO2: 26 mmol/L (ref 22–32)
Calcium: 9.7 mg/dL (ref 8.9–10.3)
Chloride: 99 mmol/L (ref 98–111)
Creatinine, Ser: 1.43 mg/dL — ABNORMAL HIGH (ref 0.44–1.00)
GFR, Estimated: 40 mL/min — ABNORMAL LOW (ref >60)
Glucose, Bld: 78 mg/dL (ref 70–99)
Potassium: 3.6 mmol/L (ref 3.5–5.1)
Sodium: 137 mmol/L (ref 135–145)

## 2024-05-23 MED ORDER — ROSUVASTATIN CALCIUM 5 MG PO TABS: 5.0000 mg | ORAL_TABLET | Freq: Every day | ORAL | 11 refills | Status: DC

## 2024-05-23 MED ORDER — POLYETHYLENE GLYCOL 3350 17 G PO PACK: 17.0000 g | PACK | Freq: Every day | ORAL | Status: DC

## 2024-05-23 MED ORDER — ENSIFENTRINE 3 MG/2.5ML IN SUSP: 3.0000 mg | Freq: Two times a day (BID) | RESPIRATORY_TRACT | Status: DC

## 2024-05-23 MED ORDER — ACETAMINOPHEN 325 MG PO TABS: 650.0000 mg | ORAL_TABLET | Freq: Four times a day (QID) | ORAL | Status: AC | PRN

## 2024-05-23 MED ORDER — BISACODYL 10 MG RE SUPP: 10.0000 mg | Freq: Every day | RECTAL | Status: DC | PRN

## 2024-05-23 MED ORDER — ALLOPURINOL 100 MG PO TABS: 50.0000 mg | ORAL_TABLET | Freq: Every day | ORAL | 5 refills | Status: AC

## 2024-05-23 MED ORDER — ALLOPURINOL 100 MG PO TABS
50.0000 mg | ORAL_TABLET | Freq: Every day | ORAL | Status: DC
  Administered 2024-05-23: 50 mg via ORAL
  Filled 2024-05-23: qty 0.5

## 2024-05-23 MED ORDER — HYDROXYCHLOROQUINE SULFATE 200 MG PO TABS
200.0000 mg | ORAL_TABLET | Freq: Two times a day (BID) | ORAL | Status: DC
  Administered 2024-05-23: 200 mg via ORAL
  Filled 2024-05-23: qty 1

## 2024-05-23 MED ORDER — HYDROCODONE-ACETAMINOPHEN 5-325 MG PO TABS: 1.0000 | ORAL_TABLET | ORAL | 0 refills | Status: AC | PRN

## 2024-05-23 MED ORDER — SENNOSIDES-DOCUSATE SODIUM 8.6-50 MG PO TABS: 1.0000 | ORAL_TABLET | Freq: Every day | ORAL | Status: DC

## 2024-05-23 NOTE — Plan of Care (Signed)
  Problem: Education: Goal: Knowledge of General Education information will improve Description: Including pain rating scale, medication(s)/side effects and non-pharmacologic comfort measures Outcome: Completed/Met   Problem: Health Behavior/Discharge Planning: Goal: Ability to manage health-related needs will improve Outcome: Completed/Met   Problem: Clinical Measurements: Goal: Respiratory complications will improve Outcome: Completed/Met   Problem: Activity: Goal: Risk for activity intolerance will decrease Outcome: Completed/Met   Problem: Coping: Goal: Level of anxiety will decrease Outcome: Completed/Met   Problem: Elimination: Goal: Will not experience complications related to bowel motility Outcome: Completed/Met   Problem: Pain Managment: Goal: General experience of comfort will improve and/or be controlled Outcome: Completed/Met

## 2024-05-23 NOTE — Plan of Care (Signed)
   Problem: Education: Goal: Knowledge of General Education information will improve Description Including pain rating scale, medication(s)/side effects and non-pharmacologic comfort measures Outcome: Progressing

## 2024-05-23 NOTE — Discharge Summary (Signed)
 Physician Discharge Summary   Patient: Tina Page MRN: [id number] DOB: [birth year]  Admit date:     05/15/2024  Discharge date: 05/23/24  Discharge Physician: Alban Pepper   PCP: Adina Buel HERO, MD   Recommendations at discharge:    F/u in clinic with orthopedic surgery to get repeat xrays F/u with pcp in 1 week to ensure your kidney function remains stable.   Discharge Diagnoses: Principal Problem:   Traumatic closed trochanteric fracture of femur with minimal displacement with delayed healing, right Active Problems:   Anemia in chronic kidney disease (CKD)   Essential hypertension   COPD (chronic obstructive pulmonary disease) (HCC)   PAD (peripheral artery disease)   Autoimmune hemolytic anemia, cold antibody type (HCC)   Hepatitis B core antibody positive   CKD (chronic kidney disease) stage 3, GFR 30-59 ml/min (HCC)   Nondisplaced fracture of greater trochanter of right femur, initial encounter for closed fracture (HCC)   Traumatic closed trochanteric fracture of femur with minimal displacement (HCC)  Resolved Problems:   * No resolved hospital problems. *  Hospital Course: Per H&P HPI: Tina Page is a 68 y.o. 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  She was previously in her usual state of health and denies preceding lightheadedness, chest pain, palpitations, shortness of breath, headache, visual disturbance, one-sided weakness numbness or tingling. In the ED vitals within normal limits Labs notable for baseline hemoglobin of 9.2 and baseline creatinine of 1.5 EKG with NSR at 76 CT hip showed findings suspicious for nondisplaced fracture through the  greater trochanter without intertrochanteric involvement. The ED provider spoke with orthopedist Dr. Rolla who advised likely nonoperative but will see patient in the a.m. Patient treated with oxycodone  for pain Admission requested for pain control    Assessment and Plan: Tina Page is a 68 y.o. 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    Nondisplaced fracture of greater trochanter of right femur, initial encounter for closed fracture (HCC) Accidental fall --ortho consulted, rec conservative management --50% protected weightbearing with assistive devices at all times  --hip abduction brace --follow-up next week for repeat x-rays in ortho office      AKI on CKD (chronic kidney disease) stage 3a Resolved  Baseline around 1.5.  [facility] 3.43    Latest Ref Rng & Units 05/23/2024    4:58 AM 05/22/2024    8:39 AM 05/21/2024    5:11 AM  BMP  Glucose 70 - 99 mg/dL 78  84  91   BUN 8 - 23 mg/dL 32  28  31   Creatinine 0.44 - 1.00 mg/dL 8.56  8.65  8.43   Sodium 135 - 145 mmol/L 137  139  138   Potassium 3.5 - 5.1 mmol/L 3.6  4.0  4.3   Chloride 98 - 111 mmol/L 99  99  100   CO2 22 - 32 mmol/L 26  29  31    Calcium  8.9 - 10.3  mg/dL 9.7  9.8  9.3   Back at baseline. Will need repeat BMP in 1 week with pcp.     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 Babara who did not feel this is related to hemolysis. Status post 1 unit of packed red blood cells hemoglobin responded  appropriately Will have repeat lab draw with oncology         Latest Ref Rng & Units 05/22/2024    8:39 AM 05/21/2024    5:11 AM 05/20/2024    4:16 AM  CBC  WBC 4.0 - 10.5 K/uL 3.4  3.7  4.8   Hemoglobin 12.0 - 15.0 g/dL 9.9  7.1  7.1   Hematocrit 36.0 - 46.0 % 30.3  21.7  21.8   Platelets 150 - 400 K/uL 171  119  121   Hemoglobin stable   Thrombocytopenia  Resolved    AMS Resolved  Due to dehydration  PAD (peripheral artery disease) (HCC) History of lower extremity vascular stents --cont statin   COPD (chronic obstructive pulmonary disease) (HCC) Not in acute exacerbation. Resume all inahlers/nebulizers on discharge.    Undifferentiated connecteive tissue disease On Plaquenil , will resume this on discharge   Gout Not in acute flare.  Will reduce home allopurinol  in half to 50mg , given renal function.   Essential hypertension Blood pressure elevated Home medications will be resumed.   Hypothyroidism continue home levothyroxine    Class I Obesity  BMI 32.06 complicates care        Consultants: Orthopedic surgery  Procedures performed: None  Disposition: Skilled nursing facility Diet recommendation:  Regular diet DISCHARGE MEDICATION: Allergies as of 05/23/2024       Reactions   Ciprofloxacin  Swelling   Facial swelling following single oral dose.    Codeine Anaphylaxis   Nsaids Other (See Comments)   Patient has Hep C.        Medication List     TAKE these medications    acetaminophen  325 MG tablet Commonly known as: TYLENOL  Take 2 tablets (650 mg total) by mouth every 6 (six) hours as needed for mild pain (pain score 1-3) or fever (or Fever >/= 101).   albuterol  108 (90 Base) MCG/ACT inhaler Commonly known as: VENTOLIN  HFA SMARTSIG:2 Puff(s) By Mouth Every 4 Hours PRN   allopurinol  100 MG tablet Commonly known as: ZYLOPRIM  Take 0.5 tablets (50 mg total) by mouth daily. What changed: how much to take   bisacodyl  10 MG suppository Commonly  known as: DULCOLAX Place 1 suppository (10 mg total) rectally daily as needed for moderate constipation.   Breztri  Aerosphere 160-9-4.8 MCG/ACT Aero inhaler Generic drug: budesonide -glycopyrrolate -formoterol  Inhale 2 puffs into the lungs in the morning and at bedtime.   CALCIUM  PO Take by mouth daily at 12 noon.   clopidogrel  75 MG tablet Commonly known as: PLAVIX  Take 1 tablet (75 mg total) by mouth daily.   cyanocobalamin  500 MCG tablet Commonly known as: VITAMIN B12 Take 500 mcg by mouth daily.   HYDROcodone -acetaminophen  5-325 MG tablet Commonly known as: NORCO/VICODIN Take 1-2 tablets by mouth every 4 (four) hours as needed for up to 5 days for moderate pain (pain score 4-6).   hydroxychloroquine  200 MG tablet Commonly known as: PLAQUENIL  Take 200 mg by mouth 2 (two) times daily.   levothyroxine  75 MCG tablet Commonly known as: SYNTHROID   Take 75 mcg by mouth daily before breakfast.   lisinopril -hydrochlorothiazide  20-25 MG tablet Commonly known as: ZESTORETIC  Take 1 tablet by mouth once daily   Ohtuvayre  3 MG/2.5ML Susp Generic drug: Ensifentrine  Take 3 mg by nebulization 2 (two) times daily.   pantoprazole  40 MG tablet Commonly known as: PROTONIX  Take 1 tablet by mouth once daily   polyethylene glycol 17 g packet Commonly known as: MIRALAX  / GLYCOLAX  Take 17 g by mouth daily. Start taking on: May 24, 2024   rosuvastatin  40 MG tablet Commonly known as: CRESTOR  Take 1 tablet (40 mg total) by mouth daily.   senna-docusate 8.6-50 MG tablet Commonly known as: Senokot-S Take 1 tablet by mouth daily. Start taking on: May 24, 2024   VITAMIN D  PO Take by mouth daily in the afternoon.        Discharge Exam: Filed Weights   05/15/24 1831 05/16/24 0500  Weight: 82.1 kg 82.1 kg   Physical Exam  Constitutional: In no distress.  Cardiovascular: Normal rate, regular rhythm. No lower extremity edema  Pulmonary: Non labored breathing on room air,  no wheezing or rales.   Abdominal: Soft. Non distended and non tender Neurological: Alert and oriented to person, place, and time.Able to move toes of R foot  Skin: Skin is warm and dry.    Condition at discharge: good  The results of significant diagnostics from this hospitalization (including imaging, microbiology, ancillary and laboratory) are listed below for reference.   Imaging Studies: CT Hip Right Wo Contrast Result Date: 05/15/2024 CLINICAL DATA:  Provided history: Hip pain, stress fracture suspected, neg xray Patient reports right hip pain after fall. EXAM: CT OF THE RIGHT HIP WITHOUT CONTRAST TECHNIQUE: Multidetector CT imaging of the right hip was performed according to the standard protocol. Multiplanar CT image reconstructions were also generated. RADIATION DOSE REDUCTION: This exam was performed according to the departmental dose-optimization program which includes automated exposure control, adjustment of the mA and/or kV according to patient size and/or use of iterative reconstruction technique. COMPARISON:  Radiograph earlier today FINDINGS: Bones/Joint/Cartilage Findings suspicious for nondisplaced fracture through the greater trochanter, best appreciated on coronal reformat series 6 images 41-47. no inter trochanteric involvement. Mild hip joint space narrowing with acetabular and femoral head neck osteophytes. Questionable serpiginous subchondral sclerosis which may represent underlying avascular necrosis. No subchondral collapse. The pubic rami are intact. No convincing hip joint effusion. Ligaments Suboptimally assessed by CT. Muscles and Tendons No intramuscular hematoma. Soft tissues No confluent soft tissue hematoma. Linear calcifications within the subcutaneous tissues laterally likely sequela of remote injury. There are advanced vascular calcifications of the iliac and femoral arteries. IMPRESSION: 1. Findings suspicious for nondisplaced fracture through the greater  trochanter. No intertrochanteric involvement. 2. Mild right hip osteoarthritis. Questionable serpiginous subchondral sclerosis which may represent underlying avascular necrosis. No subchondral collapse. 3. Advanced vascular calcifications. Electronically Signed   By: Andrea Gasman M.D.   On: 05/15/2024 21:34   DG Knee Complete 4 Views Right Result Date: 05/15/2024 CLINICAL DATA:  Right knee pain following fall, initial encounter EXAM: RIGHT KNEE - COMPLETE 4+ VIEW COMPARISON:  None Available. FINDINGS: Mild degenerative changes are noted in the medial joint space. No acute fracture or dislocation is noted. No soft tissue abnormality is seen. IMPRESSION: Mild degenerative change without acute abnormality. Electronically Signed   By: Oneil Devonshire M.D.   On: 05/15/2024 19:42   DG Hip Unilat W or Wo Pelvis 2-3 Views Right Result Date: 05/15/2024 CLINICAL DATA:  Recent fall with  right hip pain, initial encounter EXAM: DG HIP (WITH OR WITHOUT PELVIS) 3V RIGHT COMPARISON:  None Available. FINDINGS: Pelvic ring is intact. Mild degenerative changes of the hip joints are noted. No acute fracture or dislocation is seen. No soft tissue abnormality is noted. IMPRESSION: No acute abnormality noted. Electronically Signed   By: Oneil Devonshire M.D.   On: 05/15/2024 19:41    Microbiology: Results for orders placed or performed during the hospital encounter of 05/15/24  Respiratory (~20 pathogens) panel by PCR     Status: Abnormal   Collection Time: 05/19/24 12:01 AM   Specimen: Nasopharyngeal Swab; Respiratory  Result Value Ref Range Status   Adenovirus NOT DETECTED NOT DETECTED Final   Coronavirus 229E NOT DETECTED NOT DETECTED Final    Comment: (NOTE) The Coronavirus on the Respiratory Panel, DOES NOT test for the novel  Coronavirus (2019 nCoV)    Coronavirus HKU1 NOT DETECTED NOT DETECTED Final   Coronavirus NL63 NOT DETECTED NOT DETECTED Final   Coronavirus OC43 NOT DETECTED NOT DETECTED Final    Metapneumovirus NOT DETECTED NOT DETECTED Final   Rhinovirus / Enterovirus DETECTED (A) NOT DETECTED Final   Influenza A NOT DETECTED NOT DETECTED Final   Influenza B NOT DETECTED NOT DETECTED Final   Parainfluenza Virus 1 NOT DETECTED NOT DETECTED Final   Parainfluenza Virus 2 NOT DETECTED NOT DETECTED Final   Parainfluenza Virus 3 NOT DETECTED NOT DETECTED Final   Parainfluenza Virus 4 NOT DETECTED NOT DETECTED Final   Respiratory Syncytial Virus NOT DETECTED NOT DETECTED Final   Bordetella pertussis NOT DETECTED NOT DETECTED Final   Bordetella Parapertussis NOT DETECTED NOT DETECTED Final   Chlamydophila pneumoniae NOT DETECTED NOT DETECTED Final   Mycoplasma pneumoniae NOT DETECTED NOT DETECTED Final    Comment: Performed at [facility], [address] [place], [place] [address]    Labs: CBC: Recent Labs  Lab 05/18/24 0340 05/19/24 0602 05/20/24 0416 05/21/24 0511 05/22/24 0839  WBC 6.3 4.6 4.8 3.7* 3.4*  NEUTROABS  --  3.4  --   --   --   HGB 7.5* 7.0* 7.1* 7.1* 9.9*  HCT 24.3* 22.0* 21.8* 21.7* 30.3*  MCV 112.5* 108.9* 109.0* 108.5* 102.7*  PLT 150 114* 121* 119* 171   Basic Metabolic Panel: Recent Labs  Lab 05/19/24 0602 05/20/24 0416 05/21/24 0511 05/22/24 0839 05/23/24 0458  NA 138 137 138 139 137  K 4.3 4.3 4.3 4.0 3.6  CL 104 100 100 99 99  CO2 26 28 31 29 26   GLUCOSE 88 90 91 84 78  BUN 44* 37* 31* 28* 32*  CREATININE 1.98* 1.70* 1.56* 1.34* 1.43*  CALCIUM  8.8* 9.0 9.3 9.8 9.7   Liver Function Tests: Recent Labs  Lab 05/20/24 0416  AST 24  ALT 16  ALKPHOS 90  BILITOT 1.3*  PROT 6.5  ALBUMIN 3.2*   CBG: No results for input(s): GLUCAP in the last 168 hours.  Discharge time spent: greater than 30 minutes.  Signed: Alban Pepper, MD [facility] 05/23/2024

## 2024-05-23 NOTE — Progress Notes (Signed)
 Report called to Harlene Sharps at [facility].  Discharge criteria met and IV removed.

## 2024-05-23 NOTE — Progress Notes (Signed)
 Occupational Therapy Treatment Patient Details Name: Tina Page MRN: [id number] DOB: [birth year] Today's Date: 05/23/2024   History of present illness Pt is 68 year old female presented to ED after fall admitted with Right greater trochanter avulsion fracture with plans to manage non operatively with hip abduction brace at this time     PMH significant for HTN, COPD, thrombocytopenia, hypothyroidism, HLD, anemia and hepatitis C   OT comments  [name] was seen for OT treatment on this date. Upon arrival to room pt seated on toilet, agreeable to tx. Pt requires MOD A don underwear in standing and brace mgmt. SBA + RW for ADL t/f ~30 ft. MAX A pericare standing. Educated on PWBing status and hip abduction brace. Pt making good progress toward goals, will continue to follow POC. Discharge recommendation remains appropriate.        If plan is discharge home, recommend the following:  A lot of help with bathing/dressing/bathroom;Two people to help with walking and/or transfers   Equipment Recommendations  Other (comment)    Recommendations for Other Services      Precautions / Restrictions Precautions Precautions: Fall Recall of Precautions/Restrictions: Intact Required Braces or Orthoses: Other Brace Other Brace: hip abduction brace when OOB Restrictions Weight Bearing Restrictions Per Provider Order: Yes RLE Weight Bearing Per Provider Order: Partial weight bearing RLE Partial Weight Bearing Percentage or Pounds: 50 Other Position/Activity Restrictions: hip abduction brace when OOB       Mobility Bed Mobility               General bed mobility comments: not tested    Transfers Overall transfer level: Needs assistance Equipment used: Rolling walker (2 wheels) Transfers: Sit to/from Stand Sit to Stand: Contact guard assist                 Balance Overall balance assessment: Needs assistance Sitting-balance support: Feet supported Sitting balance-Leahy  Scale: Good     Standing balance support: Bilateral upper extremity supported Standing balance-Leahy Scale: Fair                             ADL either performed or assessed with clinical judgement   ADL Overall ADL's : Needs assistance/impaired                                       General ADL Comments: MOD A don underwear in standing and brace mgmt. SBA + RW for ADL t/f ~30 ft. MAX A pericare standing     Communication Communication Communication: No apparent difficulties   Cognition Arousal: Alert Behavior During Therapy: WFL for tasks assessed/performed Cognition: No apparent impairments                               Following commands: Intact                      Pertinent Vitals/ Pain       Pain Assessment Pain Assessment: 0-10 Pain Score: 3  Pain Location: R knee Pain Descriptors / Indicators: Grimacing, Aching Pain Intervention(s): Limited activity within patient's tolerance, Repositioned   Frequency  Min 2X/week        Progress Toward Goals  OT Goals(current goals can now be found in the care plan section)  Progress towards OT  goals: Progressing toward goals  Acute Rehab OT Goals OT Goal Formulation: With patient Time For Goal Achievement: 05/30/24 Potential to Achieve Goals: Good ADL Goals Pt Will Perform Grooming: with modified independence;sitting Pt Will Perform Lower Body Dressing: with modified independence;sitting/lateral leans;sit to/from stand Pt Will Transfer to Toilet: with modified independence;ambulating Pt Will Perform Toileting - Clothing Manipulation and hygiene: with modified independence;sitting/lateral leans;sit to/from stand  Plan      Co-evaluation                 AM-PAC OT 6 Clicks Daily Activity     Outcome Measure   Help from another person eating meals?: None Help from another person taking care of personal grooming?: A Little Help from another person  toileting, which includes using toliet, bedpan, or urinal?: A Lot Help from another person bathing (including washing, rinsing, drying)?: A Lot Help from another person to put on and taking off regular upper body clothing?: None Help from another person to put on and taking off regular lower body clothing?: A Lot 6 Click Score: 17    End of Session Equipment Utilized During Treatment: Rolling walker (2 wheels)  OT Visit Diagnosis: Other abnormalities of gait and mobility (R26.89);Muscle weakness (generalized) (M62.81)   Activity Tolerance Patient tolerated treatment well   Patient Left in chair;with call bell/phone within reach;with family/visitor present   Nurse Communication          Time: 8895-8881 OT Time Calculation (min): 14 min  Charges: OT General Charges $OT Visit: 1 Visit OT Treatments $Self Care/Home Management : 8-22 mins  Elston Slot, M.S. OTR/L  05/23/24, 11:42 AM  ascom [phone]

## 2024-05-23 NOTE — Discharge Instructions (Addendum)
 Your allopurinol  was reduced to 50mg  once daily because of your kidney function.   You will need to provide your Ensifentrine  nebulizer/inhaler at your rehab facility.

## 2024-05-24 ENCOUNTER — Inpatient Hospital Stay

## 2024-05-24 LAB — TYPE AND SCREEN
ABO/RH(D): A NEG
Antibody Screen: POSITIVE
DAT, IgG: POSITIVE
DAT, complement: POSITIVE
Unit division: 0
Unit division: 0
Unit division: 0
Unit division: 0
Unit division: 0
Unit division: 0

## 2024-05-24 LAB — BPAM RBC
Blood Product Expiration Date: 202510072359
Blood Product Expiration Date: 202510082359
Blood Product Expiration Date: 202510082359
ISSUE DATE / TIME: 202509210617
Unit Type and Rh: 202510082359
Unit Type and Rh: 600
Unit Type and Rh: 600
Unit Type and Rh: 600

## 2024-05-31 ENCOUNTER — Other Ambulatory Visit: Payer: Self-pay | Admitting: Acute Care

## 2024-05-31 ENCOUNTER — Inpatient Hospital Stay: Attending: Oncology

## 2024-05-31 VITALS — BP 120/46 | HR 72 | Temp 98.5°F | Resp 17

## 2024-05-31 DIAGNOSIS — Z23 Encounter for immunization: Secondary | ICD-10-CM | POA: Diagnosis not present

## 2024-05-31 DIAGNOSIS — D5911 Warm autoimmune hemolytic anemia: Secondary | ICD-10-CM | POA: Insufficient documentation

## 2024-05-31 DIAGNOSIS — Z87891 Personal history of nicotine dependence: Secondary | ICD-10-CM

## 2024-05-31 DIAGNOSIS — D631 Anemia in chronic kidney disease: Secondary | ICD-10-CM | POA: Insufficient documentation

## 2024-05-31 DIAGNOSIS — Z122 Encounter for screening for malignant neoplasm of respiratory organs: Secondary | ICD-10-CM

## 2024-05-31 DIAGNOSIS — D591 Autoimmune hemolytic anemia, unspecified: Secondary | ICD-10-CM

## 2024-05-31 DIAGNOSIS — N1832 Chronic kidney disease, stage 3b: Secondary | ICD-10-CM | POA: Insufficient documentation

## 2024-05-31 DIAGNOSIS — N1831 Chronic kidney disease, stage 3a: Secondary | ICD-10-CM | POA: Diagnosis present

## 2024-05-31 MED ORDER — IRON SUCROSE 20 MG/ML IV SOLN
200.0000 mg | Freq: Once | INTRAVENOUS | Status: AC
  Administered 2024-05-31: 200 mg via INTRAVENOUS
  Filled 2024-05-31: qty 10

## 2024-05-31 NOTE — Patient Instructions (Signed)

## 2024-06-07 ENCOUNTER — Inpatient Hospital Stay

## 2024-06-09 ENCOUNTER — Inpatient Hospital Stay

## 2024-06-09 ENCOUNTER — Other Ambulatory Visit: Payer: Self-pay

## 2024-06-09 ENCOUNTER — Telehealth: Payer: Self-pay

## 2024-06-09 VITALS — BP 127/62 | HR 78

## 2024-06-09 DIAGNOSIS — N1831 Chronic kidney disease, stage 3a: Secondary | ICD-10-CM | POA: Diagnosis not present

## 2024-06-09 DIAGNOSIS — D591 Autoimmune hemolytic anemia, unspecified: Secondary | ICD-10-CM

## 2024-06-09 DIAGNOSIS — N1832 Chronic kidney disease, stage 3b: Secondary | ICD-10-CM | POA: Diagnosis not present

## 2024-06-09 DIAGNOSIS — Z23 Encounter for immunization: Secondary | ICD-10-CM

## 2024-06-09 LAB — CBC WITH DIFFERENTIAL (CANCER CENTER ONLY)
Abs Immature Granulocytes: 0.02 K/uL (ref 0.00–0.07)
Basophils Absolute: 0.1 K/uL (ref 0.0–0.1)
Basophils Relative: 1 %
Eosinophils Absolute: 0.1 K/uL (ref 0.0–0.5)
Eosinophils Relative: 2 %
HCT: 25.6 % — ABNORMAL LOW (ref 36.0–46.0)
Hemoglobin: 8.3 g/dL — ABNORMAL LOW (ref 12.0–15.0)
Immature Granulocytes: 1 %
Lymphocytes Relative: 23 %
Lymphs Abs: 0.9 K/uL (ref 0.7–4.0)
MCH: 34.3 pg — ABNORMAL HIGH (ref 26.0–34.0)
MCHC: 32.4 g/dL (ref 30.0–36.0)
MCV: 105.8 fL — ABNORMAL HIGH (ref 80.0–100.0)
Monocytes Absolute: 0.3 K/uL (ref 0.1–1.0)
Monocytes Relative: 7 %
Neutro Abs: 2.5 K/uL (ref 1.7–7.7)
Neutrophils Relative %: 66 %
Platelet Count: 138 K/uL — ABNORMAL LOW (ref 150–400)
RBC: 2.42 MIL/uL — ABNORMAL LOW (ref 3.87–5.11)
RDW: 16.1 % — ABNORMAL HIGH (ref 11.5–15.5)
WBC Count: 3.8 K/uL — ABNORMAL LOW (ref 4.0–10.5)
nRBC: 0 % (ref 0.0–0.2)

## 2024-06-09 LAB — BASIC METABOLIC PANEL - CANCER CENTER ONLY
Anion gap: 9 (ref 5–15)
BUN: 30 mg/dL — ABNORMAL HIGH (ref 8–23)
CO2: 26 mmol/L (ref 22–32)
Calcium: 9.3 mg/dL (ref 8.9–10.3)
Chloride: 101 mmol/L (ref 98–111)
Creatinine: 1.68 mg/dL — ABNORMAL HIGH (ref 0.44–1.00)
GFR, Estimated: 33 mL/min — ABNORMAL LOW (ref >60)
Glucose, Bld: 112 mg/dL — ABNORMAL HIGH (ref 70–99)
Potassium: 3.8 mmol/L (ref 3.5–5.1)
Sodium: 136 mmol/L (ref 135–145)

## 2024-06-09 LAB — IRON AND TIBC
Iron: 66 ug/dL (ref 28–170)
Saturation Ratios: 26 % (ref 10.4–31.8)
TIBC: 256 ug/dL (ref 250–450)
UIBC: 190 ug/dL

## 2024-06-09 LAB — FERRITIN: Ferritin: 452 ng/mL — ABNORMAL HIGH (ref 11–307)

## 2024-06-09 MED ORDER — EPOETIN ALFA 40000 UNIT/ML IJ SOLN
30000.0000 [IU] | Freq: Once | INTRAMUSCULAR | Status: AC
  Administered 2024-06-09: 30000 [IU] via SUBCUTANEOUS
  Filled 2024-06-09: qty 1

## 2024-06-09 MED ORDER — INFLUENZA VAC SPLIT HIGH-DOSE 0.5 ML IM SUSY
0.5000 mL | PREFILLED_SYRINGE | Freq: Once | INTRAMUSCULAR | Status: AC
  Administered 2024-06-09: 0.5 mL via INTRAMUSCULAR
  Filled 2024-06-09: qty 0.5

## 2024-06-09 NOTE — Telephone Encounter (Signed)
 Informed Dr. Babara hgb 8.3 today scheduled for Epogen  30,000. Last hgb 2 weeks ago 9.9 after transfusion with hgb 7.1 prior to transfusion 2 weeks ago. Next scheduled lab/inj on appt on 11/7.   Dr. Babara advise patient will get Retacrit  30,000 unit today, add on iron  tibc ferritin. Adjust her next appt to be in 3 weeks lab H&H/hold tube +/- retacrit  plus possible blood transfusion on day 2. See MD in 6 weeks with lab prior (cbc iron  tibc ferritin retic) to MD +/- Retacrit  +/- IV venofer .   MD also wanted patient to know that her please let pt know that her kidney function is worse. She encourages patient to ensure hydration, avoid NSAID, and follow up with her nephrologist.    Patient notified of change in f/u plans as well as kidney function results.

## 2024-06-29 ENCOUNTER — Inpatient Hospital Stay

## 2024-06-29 VITALS — BP 116/69 | HR 90

## 2024-06-29 DIAGNOSIS — N1832 Chronic kidney disease, stage 3b: Secondary | ICD-10-CM | POA: Diagnosis not present

## 2024-06-29 DIAGNOSIS — D591 Autoimmune hemolytic anemia, unspecified: Secondary | ICD-10-CM

## 2024-06-29 DIAGNOSIS — N1831 Chronic kidney disease, stage 3a: Secondary | ICD-10-CM | POA: Diagnosis not present

## 2024-06-29 LAB — HEMOGLOBIN AND HEMATOCRIT (CANCER CENTER ONLY)
HCT: 26.4 % — ABNORMAL LOW (ref 36.0–46.0)
Hemoglobin: 8.5 g/dL — ABNORMAL LOW (ref 12.0–15.0)

## 2024-06-29 LAB — SAMPLE TO BLOOD BANK

## 2024-06-29 MED ORDER — EPOETIN ALFA 20000 UNIT/ML IJ SOLN
30000.0000 [IU] | Freq: Once | INTRAMUSCULAR | Status: DC
  Filled 2024-06-29: qty 2

## 2024-06-29 MED ORDER — EPOETIN ALFA 10000 UNIT/ML IJ SOLN
10000.0000 [IU] | Freq: Once | INTRAMUSCULAR | Status: AC
  Administered 2024-06-29: 10000 [IU] via SUBCUTANEOUS
  Filled 2024-06-29: qty 1

## 2024-06-29 MED ORDER — EPOETIN ALFA 20000 UNIT/ML IJ SOLN
20000.0000 [IU] | Freq: Once | INTRAMUSCULAR | Status: AC
  Administered 2024-06-29: 20000 [IU] via SUBCUTANEOUS
  Filled 2024-06-29: qty 1

## 2024-06-30 ENCOUNTER — Other Ambulatory Visit

## 2024-06-30 ENCOUNTER — Ambulatory Visit

## 2024-06-30 ENCOUNTER — Inpatient Hospital Stay

## 2024-07-07 ENCOUNTER — Ambulatory Visit

## 2024-07-07 ENCOUNTER — Other Ambulatory Visit

## 2024-07-14 ENCOUNTER — Other Ambulatory Visit: Payer: Self-pay

## 2024-07-14 DIAGNOSIS — D591 Autoimmune hemolytic anemia, unspecified: Secondary | ICD-10-CM

## 2024-07-17 ENCOUNTER — Inpatient Hospital Stay: Attending: Oncology

## 2024-07-17 DIAGNOSIS — D5911 Warm autoimmune hemolytic anemia: Secondary | ICD-10-CM | POA: Insufficient documentation

## 2024-07-17 DIAGNOSIS — R5382 Chronic fatigue, unspecified: Secondary | ICD-10-CM | POA: Diagnosis not present

## 2024-07-17 DIAGNOSIS — D631 Anemia in chronic kidney disease: Secondary | ICD-10-CM | POA: Diagnosis present

## 2024-07-17 DIAGNOSIS — R7689 Other specified abnormal immunological findings in serum: Secondary | ICD-10-CM | POA: Insufficient documentation

## 2024-07-17 DIAGNOSIS — L930 Discoid lupus erythematosus: Secondary | ICD-10-CM | POA: Diagnosis not present

## 2024-07-17 DIAGNOSIS — Z87891 Personal history of nicotine dependence: Secondary | ICD-10-CM | POA: Insufficient documentation

## 2024-07-17 DIAGNOSIS — Z8619 Personal history of other infectious and parasitic diseases: Secondary | ICD-10-CM | POA: Insufficient documentation

## 2024-07-17 DIAGNOSIS — N1832 Chronic kidney disease, stage 3b: Secondary | ICD-10-CM | POA: Diagnosis present

## 2024-07-17 DIAGNOSIS — D591 Autoimmune hemolytic anemia, unspecified: Secondary | ICD-10-CM

## 2024-07-17 LAB — CBC WITH DIFFERENTIAL (CANCER CENTER ONLY)
Abs Immature Granulocytes: 0.03 K/uL (ref 0.00–0.07)
Basophils Absolute: 0.1 K/uL (ref 0.0–0.1)
Basophils Relative: 1 %
Eosinophils Absolute: 0.1 K/uL (ref 0.0–0.5)
Eosinophils Relative: 2 %
HCT: 25.9 % — ABNORMAL LOW (ref 36.0–46.0)
Hemoglobin: 8.3 g/dL — ABNORMAL LOW (ref 12.0–15.0)
Immature Granulocytes: 1 %
Lymphocytes Relative: 20 %
Lymphs Abs: 0.8 K/uL (ref 0.7–4.0)
MCH: 35.9 pg — ABNORMAL HIGH (ref 26.0–34.0)
MCHC: 32 g/dL (ref 30.0–36.0)
MCV: 112.1 fL — ABNORMAL HIGH (ref 80.0–100.0)
Monocytes Absolute: 0.3 K/uL (ref 0.1–1.0)
Monocytes Relative: 7 %
Neutro Abs: 2.8 K/uL (ref 1.7–7.7)
Neutrophils Relative %: 69 %
Platelet Count: 161 K/uL (ref 150–400)
RBC: 2.31 MIL/uL — ABNORMAL LOW (ref 3.87–5.11)
RDW: 16.4 % — ABNORMAL HIGH (ref 11.5–15.5)
WBC Count: 4.1 K/uL (ref 4.0–10.5)
nRBC: 0 % (ref 0.0–0.2)

## 2024-07-17 LAB — SAMPLE TO BLOOD BANK

## 2024-07-17 LAB — IRON AND TIBC
Iron: 50 ug/dL (ref 28–170)
Saturation Ratios: 19 % (ref 10.4–31.8)
TIBC: 269 ug/dL (ref 250–450)
UIBC: 219 ug/dL

## 2024-07-17 LAB — RETIC PANEL
Immature Retic Fract: 21.1 % — ABNORMAL HIGH (ref 2.3–15.9)
RBC.: 2.35 MIL/uL — ABNORMAL LOW (ref 3.87–5.11)
Retic Count, Absolute: 146.4 K/uL (ref 19.0–186.0)
Retic Ct Pct: 6.2 % — ABNORMAL HIGH (ref 0.4–3.1)
Reticulocyte Hemoglobin: 34.4 pg (ref >27.9)

## 2024-07-17 LAB — FERRITIN: Ferritin: 480 ng/mL — ABNORMAL HIGH (ref 11–307)

## 2024-07-19 ENCOUNTER — Inpatient Hospital Stay

## 2024-07-19 ENCOUNTER — Inpatient Hospital Stay: Admitting: Oncology

## 2024-07-19 ENCOUNTER — Encounter: Payer: Self-pay | Admitting: Oncology

## 2024-07-19 VITALS — BP 130/46 | HR 91 | Temp 98.7°F | Resp 18 | Wt 177.6 lb

## 2024-07-19 DIAGNOSIS — D591 Autoimmune hemolytic anemia, unspecified: Secondary | ICD-10-CM

## 2024-07-19 DIAGNOSIS — D631 Anemia in chronic kidney disease: Secondary | ICD-10-CM | POA: Diagnosis not present

## 2024-07-19 DIAGNOSIS — N1832 Chronic kidney disease, stage 3b: Secondary | ICD-10-CM

## 2024-07-19 MED ORDER — EPOETIN ALFA 40000 UNIT/ML IJ SOLN
40000.0000 [IU] | Freq: Once | INTRAMUSCULAR | Status: AC
  Administered 2024-07-19: 40000 [IU] via SUBCUTANEOUS
  Filled 2024-07-19: qty 2

## 2024-07-19 NOTE — Progress Notes (Signed)
 Dose is 40000 units per DR Babara

## 2024-07-19 NOTE — Assessment & Plan Note (Addendum)
 Lab Results  Component Value Date   HGB 8.3 (L) 07/17/2024   TIBC 269 07/17/2024   IRONPCTSAT 19 07/17/2024   FERRITIN 480 (H) 07/17/2024    Recommend Epogen  40,000 units Q3 weeks if Hb <11 Ferritin is >200. Hold off Venofer 

## 2024-07-19 NOTE — Assessment & Plan Note (Signed)
Encourage oral hydration. Avoid nephrotoxins. 

## 2024-07-19 NOTE — Assessment & Plan Note (Signed)
 S/p Rituximab  weekly x 4  Labs are reviewed and discussed with patient. Hemoglobin is stable.

## 2024-07-19 NOTE — Progress Notes (Signed)
 Hematology/Oncology Progress note [phone] [phone]      Clinic Day:  07/19/2024  ASSESSMENT & PLAN:   Anemia in chronic kidney disease (CKD) Lab Results  Component Value Date   HGB 8.3 (L) 07/17/2024   TIBC 269 07/17/2024   IRONPCTSAT 19 07/17/2024   FERRITIN 480 (H) 07/17/2024    Recommend Epogen  40,000 units Q3 weeks if Hb <11 Ferritin is >200. 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 Hemoglobin is stable.   CKD (chronic kidney disease) stage 3, GFR 30-59 ml/min (HCC) Encourage oral hydration.  Avoid nephrotoxins.    Orders Placed This Encounter  Procedures   CBC with Differential (Cancer Center Only)    Standing Status:   Future    Expected Date:   10/11/2024    Expiration Date:   01/09/2025   Iron  and TIBC    Standing Status:   Future    Expected Date:   10/11/2024    Expiration Date:   01/09/2025   Ferritin    Standing Status:   Future    Expected Date:   10/11/2024    Expiration Date:   01/09/2025   Retic Panel    Standing Status:   Future    Expected Date:   10/11/2024    Expiration Date:   01/09/2025   Hepatic function panel    Standing Status:   Future    Expected Date:   10/11/2024    Expiration Date:   01/09/2025   CBC (Cancer Center Only)    Standing Status:   Standing    Number of Occurrences:   3    Expiration Date:   07/19/2025   Hepatic function panel    Standing Status:   Future    Expected Date:   08/09/2024    Expiration Date:   07/19/2025    Follow up  3 weeks lab in + Epogen   6 weeks, lab + Epogen  9 weeks + Epogen   12 weeks lab prior MD + Epogen  + Venofer    All questions were answered. The patient knows to call the clinic with any problems, questions or concerns.  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  Reticulocyte count was 11.3%.  Ferritin was 562.  Iron  studies revealed a saturation of 20% and a TIBC of 214 (low).  B12 was 231 (low normal).  Folate was 42.   Peripheral smear revealed rouleaux formation.    Additional testing included the following + studies:  hepatitis C antibody, hepatitis B core antibody, CMV IgM, and EBV VCA (IgM and IgG).  Hepatitis B by PCR was negative.  Hepatitis C RNA was negative.  Mycoplasma pneumonia IgM was negative.  Reticulocyte count was 11.3% (high) indicating appropriate marrow response.  C3 and C4 were normal.  Cold agglutinin titer was negative x 2.  Negative studies included:  ANA, hepatitis B surface antigen, SPEP, and free light chain ratio.   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  MMA was initially 330 (normal).  Repeat MMA was elevated on 08/01/2016 confirming B12 deficiency.  Hepatitis B surface antibody was positive and hepatitis B surface antigen was negative on 08/06/2016.   07/07/2016  Chest, abdomen, and pelvis CT angiogram on revealed moderate diffuse atherosclerotic vascular disease of the abdominal aorta with severe stenosis of the left common iliac artery with suspected short segment occlusion. There was no adenopathy.  Spleen was normal.   04/15/2017 Abd US  revealed a normal spleen and sludge in the gallbladder.  The liver was echogenic consistent fatty infiltration and/or hepatocellular disease.  For hemolytic anemia treatments, in 2017, She has received multiple units of  warmed PRBCs to date.  08/01/2016-03/12/2017, steroid treatment with prednisone .  She is also on folic acid  1  mg daily. Borderline low vitamin B12 level on 07/09/2016.  Patient has been on oral vitamin B12 supplementation.  10/12/2017  positive warm autoantibody.  LDH and bilirubin were normal.   She began prednisone  10 mg on 12/07/2017 (discontinued 03/2018).  She received Rituxan  weekly x 4 (last dose 06/16/2018).  Entecavir  was discontinued on 07/06/2019.  06/12/2020 developed recurrent anemia.  Hematocrit was 27.6, hemoglobin 8.4, MCV 104.5, platelets 138,000, WBC 3,400 (ANC 2,300). Creatinine was 1.17 (CrCl 50 ml/min).  Positive warm autoantibody  Reticulocyte count was 7.1%.  LDH was 204 (98-192).  06/16/2020, began prednisone  1 mg/kilogram treatments, 07/24/2020, prednisone  was tapered down to 20 mg.  She received Rituxan  1000 mg on day 1 and 15 (07/15/2020 and 07/29/2020).  She began entecavir  on 06/28/2020.  Prednisone  was tapered off.  She was on Septra  for PCP prophylaxis which is now currently on hold    Other medical problems 07/10/2016.  She underwent PTCA and stent placement in right and left common iliac arteries and left external iliac artery. She is on Plavix .   03/19/2015 EGD revealed gastritis in the body and antrum.  Colonoscopy revealed one hyperplastic polyp.  Repeat EGD on 07/08/2016 was normal.  No evidence of bleeding.  History of she developed peri-oral and intranasal herpes simplex-1.  She was treated with valacyclovir  and doxycycline  on 10/19/2016.   10/26/2017. She developed flu like symptoms.  Symptoms included cough, myalgias, and fever (tmax unknown).  She was prescribed Mucinex , Tamiflu, and amoxicillin .  She only took amoxicillin  x 5 days.  She developed increased liver function tests on 11/02/2017. CMV IgG was positive.  EBV VCA IgG, NA IgG, early antigen antibody IgG were elevated.  EBV VCA IgM was < 36.  Testing was c/w a convalescence/past infection or reactivated infection.  LFTs normalized on 11/15/2017.  Smooth muscle antibody was 39 (high) on 11/10/2017 and 34 (high)  on 11/30/2017.     Chest CT on 11/16/2017 revealed a 2.2 x 2.0 x 2.4 spiculated RIGHT middle lobe mass.  There was tiny nonspecific upper lobe pulmonary nodules greater on RIGHT, largest 3 mm, of uncertain etiology.  There was additional 8 x 8 mm opacity in the posterior sulcus of the RIGHT lower lobe. PET scan on 11/22/2017 revealed a 2 cm hypermetabolic right middle lobe lung mass (SUV 3.4) consistent  with primary lung neoplasm.  There were no findings for mediastinal/hilar lymphadenopathy or metastatic disease.  There were areas of hypermetabolism involving the left oblique abdominal muscles and the anorectal junction. PFTs on 11/18/2017 revealed an FEV1 of 1.22 liters (51%).  DLCO adj was 6.8 mL/mmHg/min (32%).   ENB done on 12/07/2017. Cytology was negative for malignancy. Pathology demonstrated organizing pneumonia and chronic bronchitis. Pathologist commented that organizing pneumonia spanned about 2 mm in  one fragment. Cases of focal organizing pneumonia with hypermetabolism on FDG-PET have been described, but changes of this type can also be adjacent to a neoplasm. Patient prescribed a daily dose of Prednisone  10 mg   07/24/2020  repeat  chest CT on  revealed Lung-RADS 2, benign appearance or behavior. There was emphysema, aortic atherosclerosis, and coronary artery calcifications.  diarrhea.  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  She received azithromycin  x 3 days.  She received ciprofloxacin  x 10 days without improvement.  She has seen ID in [place].  She began Bactrim  on 03/04/2018 (discontinued on 03/11/2018) secondary to increase in creatine.   Chronic kidney disease Urinalysis on 11/15/2017 revealed revealed no hemoglobin, bilirubin or active sediment.   Liver function tests increased on 02/15/2019.  Work-up on 02/16/2019 revealed hepatitis B E antibody positive.  Negative studies included: hepatitis A total antibody, hepatitis A IgM, hepatitis B  surface antigen, hepatitis B E antigen.  Hepatitis B DNA was not detected.  Folate was 80.5 and vitamin B12 was 443.  She has received her vaccinations:  She has completed the PCV13, PPSV23, and HiB.  She received Menvio (quadrivalent meningoccal vaccine) and Bexero (univalent serogroup B meningoccal vaccine) on 07/06/2019.  She was diagnosed with C difficile + diarrhea on 08/12/2020.  She completed a course of oral vancomycin .  Stool was negative for C. difficile on 08/27/2020.  11/17/2020 - 11/20/2020 [facility] admission with left L5 varicella zoster.  Lesion was positive for VZV. She was treated with ceftazidime  and a valacyclovir .  She was discharged on 9 days of Valtrex  1 gm 3 times daily followed by 500 mg daily indefinitely for suppression and doxycycline .  History of discoid lupus  #Positive hepatitis B core antibody Previously on entecavir  0.5 mg daily, currently she has finished a course. Dose adjusted based on renal function. CrCl 30-49 ml/min   50% dosing (0.5 mg QOD) CrCl >= 50 ml/min 100% dosing (0.5 mg a day). With her kidney function currently QOD She has completed course of Entecavir  and stopped 08/14/2021  # PCP prophylaxis Patient was on Septra  DS on Mondays, Wednesdays, and Fridays, which was held due to fluctuated kidney functions.  Currently off Septra .  12/03/2021, CT chest lung cancer screening showed new right middle lobe pulmonary nodule with an aggressive appearance characterized as lung RADS 4 VS.  12/22/2021 PET showed No abnormal FDG avidity associated with the resolving right middle lobe pulmonary nodule now with a 12 mm ground-glass opacity in the area of prior solid nodularity, most consistent with an infectious or inflammatory etiology  INTERVAL HISTORY Tina Page is a 68 y.o. female who has above history reviewed by me today presents for follow up visit  Chronic fatigue unchanged.    During the interval, she was hospitalized due to acute right hip  fracture sustained in a fall after tripping a trash can. During hospitalization,  hemoglobin trended down to 7.1, s/p blood transfusion.  She is on Epogen  injections Q3 weeks.  She was started on Doxycycline .  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/23/2017   Post PTCA 07/12/2016   iliac stents bilaterally 2017   Thrombocytopenia 10/04/2016   Wears dentures    full upper and lower    Past Surgical History:  Procedure Laterality Date   ELECTROMAGNETIC NAVIGATION BROCHOSCOPY N/A 12/07/2017   Procedure: ELECTROMAGNETIC NAVIGATION BRONCHOSCOPY;  Surgeon: Isaiah Scrivener, MD;  Location: [facility] ORS;  Service: Cardiopulmonary;  Laterality: N/A;   ESOPHAGOGASTRODUODENOSCOPY (EGD) WITH PROPOFOL  N/A 07/08/2016   Procedure: ESOPHAGOGASTRODUODENOSCOPY (EGD) WITH PROPOFOL ;  Surgeon: Lamar ONEIDA Holmes, MD;  Location[facility] ENDOSCOPY;  Service: Endoscopy;  Laterality: N/A;   KNEE SURGERY Right    repair of acl tear   PERIPHERAL VASCULAR CATHETERIZATION N/A 07/10/2016   Procedure: Lower Extremity Angiography;  Surgeon: Cordella KANDICE Shawl, MD;  Location: [facility] INVASIVE CV LAB;  Service: Cardiovascular;  Laterality: N/A;   PERIPHERAL VASCULAR CATHETERIZATION N/A 07/10/2016   Procedure: Abdominal Aortogram w/Lower Extremity;  Surgeon: Cordella KANDICE Shawl, MD;  Location: [facility] INVASIVE CV LAB;  Service: Cardiovascular;  Laterality: N/A;   PERIPHERAL VASCULAR CATHETERIZATION  07/10/2016   Procedure: Lower Extremity Intervention;  Surgeon:  Cordella KANDICE Shawl, MD;  Location: [facility] INVASIVE CV LAB;  Service: Cardiovascular;;    Family History  Problem Relation Age of Onset   Diabetes Mother    Hypertension Mother    Diabetes Maternal Grandfather    Hypertension Maternal Grandfather    Breast cancer Neg Hx     Social History:  reports that she quit smoking about 8 years ago. Her smoking use included cigarettes. She started smoking about 55 years ago. She has a 58.8 pack-year smoking history. She has never used smokeless tobacco. She reports that she does not drink alcohol and does not use drugs.  Former smoker, 58-pack-year smoking history.   She has a daughter, Jeanine and a daughter named [name].  She lives in [place]. The patient is alone today.  Allergies:  Allergies  Allergen Reactions   Ciprofloxacin  Swelling    Facial swelling following single oral dose.    Codeine Anaphylaxis   Nsaids Other (See Comments)    Patient has Hep C.     Current Medications: Current Outpatient Medications  Medication Sig Dispense Refill   acetaminophen  (TYLENOL ) 325 MG tablet Take 2 tablets (650 mg total) by mouth every 6 (six) hours as needed for mild pain (pain score 1-3) or fever (or Fever >/= 101).     albuterol  (VENTOLIN  HFA) 108 (90 Base) MCG/ACT inhaler SMARTSIG:2 Puff(s) By Mouth Every 4 Hours PRN     allopurinol  (ZYLOPRIM ) 100 MG tablet Take 0.5 tablets (50 mg total) by mouth daily. 30 tablet 5   budesonide -glycopyrrolate -formoterol  (BREZTRI  AEROSPHERE) 160-9-4.8 MCG/ACT AERO inhaler Inhale 2 puffs into the lungs in the morning and at bedtime. 3 each 3   CALCIUM  PO Take by mouth daily at 12 noon.     clopidogrel  (PLAVIX ) 75 MG tablet Take 1 tablet (75 mg total) by mouth daily. 30 tablet 5   cyanocobalamin  500 MCG tablet Take 500 mcg by mouth daily.     doxycycline  (VIBRA -TABS) 100 MG tablet Take 100 mg by mouth 2 (two) times daily.     hydroxychloroquine  (PLAQUENIL ) 200 MG tablet Take 200 mg by mouth 2 (two) times daily.      levothyroxine  (SYNTHROID , LEVOTHROID) 75 MCG tablet Take 75 mcg by mouth daily before breakfast.      lisinopril -hydrochlorothiazide  (ZESTORETIC ) 20-25 MG tablet Take 1 tablet by mouth  once daily 90 tablet 3   pantoprazole  (PROTONIX ) 40 MG tablet Take 1 tablet by mouth once daily 30 tablet 0   rosuvastatin  (CRESTOR ) 40 MG tablet Take 1 tablet (40 mg total) by mouth daily. 90 tablet 3   VITAMIN D  PO Take by mouth daily in the afternoon.     Ensifentrine  (OHTUVAYRE ) 3 MG/2.5ML SUSP Take 3 mg by nebulization 2 (two) times daily. (Patient not taking: Reported on 07/19/2024)     No current facility-administered medications for this visit.    Review of Systems  Constitutional:  Positive for fatigue. Negative for appetite change, chills and fever.  HENT:   Negative for hearing loss and voice change.   Eyes:  Negative for eye problems.  Respiratory:  Negative for chest tightness and cough.   Cardiovascular:  Negative for chest pain.  Gastrointestinal:  Negative for abdominal distention, abdominal pain and blood in stool.  Endocrine: Negative for hot flashes.  Genitourinary:  Negative for difficulty urinating and frequency.   Musculoskeletal:  Negative for arthralgias.       Right hip fracture.  Soreness of knee  Skin:  Negative for itching and rash.  Neurological:  Negative for extremity weakness.  Hematological:  Negative for adenopathy.  Psychiatric/Behavioral:  Negative for confusion. The patient is not nervous/anxious.      Performance status (ECOG): 1  Vital Signs Blood pressure (!) 130/46, pulse 91, temperature 98.7 F (37.1 C), temperature source Tympanic, resp. rate 18, weight 177 lb 9.6 oz (80.6 kg), SpO2 100%.  Physical Exam Constitutional:      General: She is not in acute distress.    Appearance: She is obese. She is not diaphoretic.  HENT:     Head: Normocephalic and atraumatic.  Eyes:     General: No scleral icterus. Cardiovascular:     Rate and Rhythm: Normal  rate and regular rhythm.  Pulmonary:     Effort: Pulmonary effort is normal. No respiratory distress.  Abdominal:     General: There is no distension.     Palpations: Abdomen is soft.     Tenderness: There is no abdominal tenderness.  Musculoskeletal:        General: Normal range of motion.     Cervical back: Normal range of motion and neck supple.  Skin:    Findings: No erythema.  Neurological:     Mental Status: She is alert and oriented to person, place, and time. Mental status is at baseline.     Motor: No abnormal muscle tone.  Psychiatric:        Mood and Affect: Mood and affect normal.      Laboratory findings     Latest Ref Rng & Units 07/17/2024    1:09 PM 06/29/2024   12:47 PM 06/09/2024   12:55 PM  CBC  WBC 4.0 - 10.5 K/uL 4.1   3.8   Hemoglobin 12.0 - 15.0 g/dL 8.3  8.5  8.3   Hematocrit 36.0 - 46.0 % 25.9  26.4  25.6   Platelets 150 - 400 K/uL 161   138       Latest Ref Rng & Units 06/09/2024   12:55 PM 05/23/2024    4:58 AM 05/22/2024    8:39 AM  CMP  Glucose 70 - 99 mg/dL 887  78  84   BUN 8 - 23 mg/dL 30  32  28   Creatinine 0.44 - 1.00 mg/dL 8.31  8.56  8.65   Sodium 135 - 145 mmol/L 136  137  139   Potassium 3.5 - 5.1 mmol/L 3.8  3.6  4.0   Chloride 98 - 111 mmol/L 101  99  99   CO2 22 - 32 mmol/L 26  26  29    Calcium  8.9 - 10.3 mg/dL 9.3  9.7  9.8

## 2024-08-08 ENCOUNTER — Other Ambulatory Visit: Payer: Self-pay

## 2024-08-08 DIAGNOSIS — D591 Autoimmune hemolytic anemia, unspecified: Secondary | ICD-10-CM

## 2024-08-09 ENCOUNTER — Inpatient Hospital Stay: Attending: Oncology

## 2024-08-09 ENCOUNTER — Inpatient Hospital Stay

## 2024-08-09 VITALS — BP 117/53

## 2024-08-09 DIAGNOSIS — N1832 Chronic kidney disease, stage 3b: Secondary | ICD-10-CM | POA: Insufficient documentation

## 2024-08-09 DIAGNOSIS — D591 Autoimmune hemolytic anemia, unspecified: Secondary | ICD-10-CM

## 2024-08-09 DIAGNOSIS — R7689 Other specified abnormal immunological findings in serum: Secondary | ICD-10-CM | POA: Insufficient documentation

## 2024-08-09 DIAGNOSIS — D631 Anemia in chronic kidney disease: Secondary | ICD-10-CM | POA: Diagnosis present

## 2024-08-09 DIAGNOSIS — L930 Discoid lupus erythematosus: Secondary | ICD-10-CM | POA: Diagnosis not present

## 2024-08-09 DIAGNOSIS — Z8619 Personal history of other infectious and parasitic diseases: Secondary | ICD-10-CM | POA: Diagnosis not present

## 2024-08-09 DIAGNOSIS — Z87891 Personal history of nicotine dependence: Secondary | ICD-10-CM | POA: Insufficient documentation

## 2024-08-09 DIAGNOSIS — D5911 Warm autoimmune hemolytic anemia: Secondary | ICD-10-CM | POA: Insufficient documentation

## 2024-08-09 DIAGNOSIS — R5382 Chronic fatigue, unspecified: Secondary | ICD-10-CM | POA: Insufficient documentation

## 2024-08-09 LAB — HEPATIC FUNCTION PANEL
ALT: 8 U/L (ref 0–44)
AST: 20 U/L (ref 15–41)
Albumin: 4.1 g/dL (ref 3.5–5.0)
Alkaline Phosphatase: 91 U/L (ref 38–126)
Bilirubin, Direct: 0.3 mg/dL — ABNORMAL HIGH (ref 0.0–0.2)
Indirect Bilirubin: 0.3 mg/dL (ref 0.3–0.9)
Total Bilirubin: 0.6 mg/dL (ref 0.0–1.2)
Total Protein: 6.8 g/dL (ref 6.5–8.1)

## 2024-08-09 LAB — CBC (CANCER CENTER ONLY)
HCT: 27.1 % — ABNORMAL LOW (ref 36.0–46.0)
Hemoglobin: 8.4 g/dL — ABNORMAL LOW (ref 12.0–15.0)
MCH: 35.1 pg — ABNORMAL HIGH (ref 26.0–34.0)
MCHC: 31 g/dL (ref 30.0–36.0)
MCV: 113.4 fL — ABNORMAL HIGH (ref 80.0–100.0)
Platelet Count: 141 K/uL — ABNORMAL LOW (ref 150–400)
RBC: 2.39 MIL/uL — ABNORMAL LOW (ref 3.87–5.11)
RDW: 14.5 % (ref 11.5–15.5)
WBC Count: 3.7 K/uL — ABNORMAL LOW (ref 4.0–10.5)
nRBC: 0 % (ref 0.0–0.2)

## 2024-08-09 MED ORDER — EPOETIN ALFA 40000 UNIT/ML IJ SOLN
40000.0000 [IU] | Freq: Once | INTRAMUSCULAR | Status: AC
  Administered 2024-08-09: 40000 [IU] via SUBCUTANEOUS
  Filled 2024-08-09: qty 1
  Filled 2024-08-09: qty 2

## 2024-08-10 ENCOUNTER — Other Ambulatory Visit

## 2024-08-11 ENCOUNTER — Ambulatory Visit
Admission: RE | Admit: 2024-08-11 | Discharge: 2024-08-11 | Disposition: A | Source: Ambulatory Visit | Attending: Acute Care | Admitting: Acute Care

## 2024-08-11 DIAGNOSIS — Z122 Encounter for screening for malignant neoplasm of respiratory organs: Secondary | ICD-10-CM

## 2024-08-11 DIAGNOSIS — Z87891 Personal history of nicotine dependence: Secondary | ICD-10-CM

## 2024-08-14 ENCOUNTER — Ambulatory Visit

## 2024-08-14 ENCOUNTER — Ambulatory Visit: Admitting: Oncology

## 2024-08-14 ENCOUNTER — Other Ambulatory Visit

## 2024-08-15 ENCOUNTER — Encounter (HOSPITAL_COMMUNITY): Payer: Self-pay | Admitting: Family Medicine

## 2024-08-16 ENCOUNTER — Telehealth: Payer: Self-pay | Admitting: Acute Care

## 2024-08-16 NOTE — Telephone Encounter (Signed)
 Read as a LR 2, needs to be a 6 month follow up.  She has a GG nodule that has become more solid.  Three vessel CAD, aortic atherosclerosis>> on Crestor  No cardiology  Please fax results and plan of care to PCP. Let them know plan.  Thanks so much

## 2024-08-17 ENCOUNTER — Other Ambulatory Visit: Payer: Self-pay

## 2024-08-17 DIAGNOSIS — Z122 Encounter for screening for malignant neoplasm of respiratory organs: Secondary | ICD-10-CM

## 2024-08-17 DIAGNOSIS — Z87891 Personal history of nicotine dependence: Secondary | ICD-10-CM

## 2024-08-17 DIAGNOSIS — R911 Solitary pulmonary nodule: Secondary | ICD-10-CM

## 2024-08-17 NOTE — Telephone Encounter (Signed)
 Spoke with patient and reviewed recent Lung CT results. Pt is in agreement to complete a 6 month follow up 02/12/2025 due to solid component growth.. Order placed. Results and plan to PCP.

## 2024-08-18 ENCOUNTER — Encounter: Payer: Self-pay | Admitting: Pulmonary Disease

## 2024-08-18 ENCOUNTER — Ambulatory Visit: Admitting: Pulmonary Disease

## 2024-08-18 VITALS — BP 126/60 | HR 77 | Temp 97.8°F | Ht 63.0 in | Wt 177.6 lb

## 2024-08-18 DIAGNOSIS — M359 Systemic involvement of connective tissue, unspecified: Secondary | ICD-10-CM | POA: Diagnosis not present

## 2024-08-18 DIAGNOSIS — R918 Other nonspecific abnormal finding of lung field: Secondary | ICD-10-CM | POA: Diagnosis not present

## 2024-08-18 DIAGNOSIS — J449 Chronic obstructive pulmonary disease, unspecified: Secondary | ICD-10-CM

## 2024-08-18 DIAGNOSIS — D591 Autoimmune hemolytic anemia, unspecified: Secondary | ICD-10-CM | POA: Diagnosis not present

## 2024-08-18 NOTE — Progress Notes (Signed)
 "  Subjective:    Patient ID: Tina Page, female    DOB: December 02, 1955, 68 y.o.   MRN: [id number]  Patient Care Team: Tina Buel HERO, MD as PCP - General (Family Medicine) Tina Alm SQUIBB, MD (Infectious Diseases) Tina Carmine, MD as Consulting Physician (Gastroenterology) Tina Bodily, MD as Consulting Physician (Infectious Diseases) Tina Evalene PARAS, MD as Consulting Physician (Cardiology) Tina Call, MD as Consulting Physician (Oncology)  Chief Complaint  Patient presents with   COPD    Using Breztri  daily, albuterol  as needed. Has been out of Ohtuvayre  since the fall.     BACKGROUND/INTERVAL:Tina Page is a very complex 68 year old female, former smoker, quit in 2017 (63-pack-year history). 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 coli, impetigo, autoimmune hemolytic anemia, B12 deficiency, elevated liver function testing, hyperlipidemia, pulmonary nodules and undifferentiated connective tissue disease.  She presents today for scheduled follow-up.  Last seen on 15 May 2024 by me.   HPI Discussed the use of AI scribe software for clinical note transcription with the patient, who gave verbal consent to proceed.  History of Present Illness   Tina Page is a 68 year old female with COPD who presents for follow-up regarding her lung function and recent CT scan findings.  Her breathing is described as consistent with her previous state. She was previously using a nebulizer medication, Ohtuvayre , which she found beneficial, but there was an issue with reenrollment. Currently, she is using Breztri .  A recent CT scan showed a slight change in the measurement of a nodule on the right side, which is a subsolid nodule of which she has several. She recalls a previous biopsy that indicated pneumonia and inflammation (EMB biopsy, 2019). With a history of connective tissue  disease, she is predisposed to inflammation.  No acute symptoms such as fever or chest pain.   DATA PFTS: 11/18/2017 PFTs: FEV1 1.22 L or 51% predicted, FVC 1.88 L or 63% predicted, FEV1/FVC 65%, no bronchodilator response.  Lung volumes normal, diffusion capacity moderate to severely impaired. 07/07/2022 PFTs: FEV1 0.86 L or 36% predicted, FVC 1.34 L or 43% predicted, FEV1/FVC 64%, there is significant bronchodilator response with both response in FEV1 and FVC improvement.  No hyperinflation, air trapping evident. Moderate to severe diffusion capacity impairment.  There is modest decline in lung function when compared to prior. Consistent with asthma/COPD overlap. 05/25/2023 PFTs: FEV1 0.84 L or 35% predicted, FVC 1.29 L or 42% predicted, FEV1/FVC 65%, there is significant postbronchodilator response.  Lung volumes show air trapping.  Diffusion capacity severely impaired.   IMAGING: 02/16/18 CT chest wo contrast- showed almost complete resolution of spiculated nodules RML. Stable 5mm right parahilar nodules since March 2019 but not present in 2017.  06/27/19 CT chest wo contrast-  previously described nodule of the right middle lobe has almost completely resolved, with residual linear scarring. There is persistent paramedian consolidation of the medial right middle lobe and lingula generally in keeping with atypical infection, particularly atypical mycobacterium. Interval increase in size of a small nodule of the right middle lobe, now measuring 4 mm, previously 2 mm Unchanged tiny biapical pulmonary nodules, benign sequelae of nonspecific infection or inflammation, possibly related cigarette smoking, atypical infection, or other inhalational, inflammatory lung disease. 12/27/19 CT Chest wo contrast- stable sub-centimeter bilateral pulmonary nodules consistent with benign etiology. Stable paramedian consolidation RML and lingula. New airspace disease inferior RML with a few adjacent subcenterimeter  nodules. Overall, consistent with waxing  and waning atypical infectious process such as MAI 07/24/2020 chest LDCT: Lung RADS 2, benign appearance or behavior.  Moderate centrilobular emphysema, multiple nonsolid and solid tiny nodules largest of which was 4.2 mm 12/02/2021 LDCT chest: New right middle lobe pulmonary nodule with aggressive appearance categorized as lung RADS 4 BS, suspicious.  Mild diffuse bronchial wall thickening and mild centrilobular and paraseptal emphysema imaging findings suggestive of underlying COPD 12/22/2021 PET/CT: The previously noted right middle pulmonary nodule appears to be resolving, no FDG avidity noted.  Consistent with infectious/inflammatory etiology.  Recommend continued radiographic follow-up to resolution.  Hypermetabolic activity in the intercostal muscles and crura of the diaphragm reflecting physiologic activity related to ventilation 03/05/2022 LDCT chest: Lung RADS 3, probably benign findings, new 5.9 mm right lower lobe nodule, short-term follow-up 6 months recommended.  Continued decrease in the size of the right middle lobe nodule. 09/07/2022 LDCT chest: Lung RADS 4A, suspicious, small solid anterior right lower lobe pulmonary nodule measuring 5.2 mm below PET/CT resolution, recommend follow-up CT 3 months. 10/23/2022 echocardiogram: LVEF 60 to 65% mild LVH, no wall motion abnormalities, grade 1 DD, right ventricular systolic function, no pulmonary artery hypertension.  No valvular abnormalities. 12/07/2022 LDCT chest: Lung RADS 4A, suspicious, minimally progressive irregular nodule in the right lower lobe measuring 6.1 mm still below PET/CT resolution, suggest additional follow-up in 3 months. 03/09/2023 LDCT chest: Lung RADS 4A, suspicious, 7.2 mm right lower lobe nodule remains mildly progressive however still below the threshold for PET sensitivity.  Additional repeat low-dose CT in 3 to 6 months. 06/01/2023 LDCT chest: Stable pulmonary nodules.   Scattered pulmonary parenchymal scarring.  Lung RADS 2 benign appearance of behavior, continue screening low-dose chest CT without contrast in 12 months.  Emphysema.       Review of Systems A 10 point review of systems was performed and it is as noted above otherwise negative.   Patient Active Problem List   Diagnosis Date Noted   Traumatic closed trochanteric fracture of femur with minimal displacement (HCC) 05/17/2024   Nondisplaced fracture of greater trochanter of right femur, initial encounter for closed fracture (HCC) 05/15/2024   Traumatic closed trochanteric fracture of femur with minimal displacement with delayed healing, right 05/15/2024   Anemia in chronic kidney disease (CKD) 03/30/2022   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   Preseptal cellulitis of right eye 08/29/2021   Leukopenia 11/22/2020   Herpes zoster without complication    Left leg pain 11/17/2020   Diarrhea 09/01/2020   Clostridium difficile diarrhea 08/26/2020   Lupus 08/11/2020   Iatrogenic cushingoid features 08/11/2020   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) 08/11/2020   Papular rash 08/05/2020   Bacteremia due to Escherichia coli 07/17/2020   UTI (urinary tract infection) 07/16/2020   Hypothyroidism    Fever    Encounter for antineoplastic immunotherapy 07/15/2020   Goals of care, counseling/discussion 06/19/2020   Abnormal CT of the chest 06/04/2020   Oral candidiasis 06/04/2020   Former smoker 06/04/2020   Pulmonary nodules 12/28/2019   CKD (chronic kidney disease) stage 3, GFR 30-59 ml/min (HCC) 05/15/2019   Postop check 04/24/2019   Paronychia of great toe of right foot 04/17/2019   Ingrowing nail 04/17/2019   Medication monitoring encounter 03/25/2018   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 coli 02/18/2018   Aortic atherosclerosis 12/13/2017    Shortness of breath 12/13/2017  Nodule of middle lobe of right lung 11/23/2017   Renal insufficiency 11/17/2017   Autoimmune hemolytic anemia (HCC) 11/03/2017   Elevated liver function tests 11/03/2017   Elevated uric acid in blood 11/03/2017   Low serum cortisol level 01/04/2017   Elevated alkaline phosphatase level 10/25/2016   Oral herpes simplex infection 10/25/2016   Current chronic use of systemic steroids 10/23/2016   Impetigo 10/23/2016   Thrombocytopenia 10/04/2016   B12 deficiency 08/06/2016   Symptomatic anemia 07/21/2016   Atherosclerosis of native arteries of extremity with intermittent claudication 07/20/2016   PAD (peripheral artery disease) 07/12/2016   Post PTCA 07/12/2016   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   Autoimmune hemolytic anemia, 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/12/2016   Hepatitis B core antibody positive 07/12/2016   Tobacco abuse 07/12/2016   Leg pain 07/07/2016   Essential hypertension 07/07/2016   HLD (hyperlipidemia) 07/07/2016   COPD (chronic obstructive pulmonary disease) (HCC) 07/07/2016   Acute kidney injury superimposed on CKD 07/07/2016   Heme positive stool 01/29/2015    Social History   Tobacco Use   Smoking status: Former    Current packs/day: 0.00    Average packs/day: 1.3 packs/day for 47.0 years (58.8 ttl pk-yrs)    Types: Cigarettes    Start date: 07/11/1969    Quit date: 07/11/2016    Years since quitting: 8.1   Smokeless tobacco: Never  Substance Use Topics   Alcohol use: No    Allergies[1]  Active Medications[2]  Immunization History  Administered Date(s) Administered   Fluad Trivalent(High Dose 65+) 05/13/2023   HIB (PRP-T) 04/14/2018   INFLUENZA, HIGH DOSE SEASONAL PF 06/09/2024   Influenza Split 05/29/2011, 05/25/2013   Influenza,inj,Quad PF,6+ Mos 05/17/2018, 07/02/2020, 05/20/2022   Influenza,inj,quad, With Preservative 05/17/2014   Influenza-Unspecified 05/17/2018, 05/16/2019   Meningococcal B, OMV  07/06/2019   Meningococcal Mcv4o 07/06/2019, 07/02/2020   Moderna Sars-Covid-2 Vaccination 11/14/2019, 12/08/2019   Pfizer(Comirnaty)Fall Seasonal Vaccine 12 years and older 07/07/2023   Pneumococcal Conjugate-13 04/14/2018   Pneumococcal Polysaccharide-23 07/22/2016   Tdap 05/29/2011, 10/18/2014, 11/18/2020        Objective:     Vitals:   08/18/24 1032  BP: 126/60  Pulse: 77  Temp: 97.8 F (36.6 C)  Height: 5' 3 (1.6 m)  Weight: 177 lb 9.6 oz (80.6 kg)  SpO2: 98%  TempSrc: Temporal  BMI (Calculated): 31.47     SpO2: 98 %  GENERAL: Overweight woman, no acute distress, fully ambulatory.  No conversational dyspnea. HEAD: Normocephalic, atraumatic.  EYES: Pupils equal, round, reactive to light.  No scleral icterus.  MOUTH: Poor dentition, oral mucosa moist.  No thrush. No exudates. NECK: Supple. No thyromegaly. Trachea midline. No JVD.  No adenopathy. PULMONARY: Good air entry bilaterally.  No adventitious sounds noted. CARDIOVASCULAR: S1 and S2. Regular rate and rhythm.  No rubs, murmurs or gallops heard. ABDOMEN: Obese, otherwise benign. MUSCULOSKELETAL: No joint deformity, no clubbing, no edema.  NEUROLOGIC: No overt focal deficit, no gait disturbance, speech is fluent. SKIN: Intact,warm,dry.  Multiple petechiae noted.   PSYCH: Mood and behavior normal.  Representative images from CT chest performed 11 August 2024 showing ground glass opacities (arrows):        Assessment & Plan:     ICD-10-CM   1. Stage 3 severe COPD by GOLD classification (HCC)  J44.9     2. Pulmonary nodules  R91.8     3. Autoimmune hemolytic anemia (HCC)  D59.10     4. Undifferentiated connective tissue disease  M35.9  Discussion:    Stage 3 severe chronic obstructive pulmonary disease (COPD) by GOLD classification COPD is well-managed with clear lung sounds, the best in a long time. Previous treatment with Ohtuvayre  via nebulizer was beneficial, but there was an issue with  re-enrollment in the program. - Submitted request to pharmacy team to re-enroll in Ohtuvayre  program - Continue current COPD management plan  Pulmonary nodules CT scan shows a slight change in a nodule on the right side, described as a smudge without a solid component. Radiologist is not concerned, but due to connective tissue disease, a follow-up scan is warranted to monitor for any changes. - Scheduled follow-up CT scan in six months to monitor pulmonary nodules  Undifferentiated connective tissue disease Contributes to inflammation and potential pulmonary complications. No acute issues reported. - Continue monitoring for any new symptoms or complications related to connective tissue disease      Advised if symptoms do not improve or worsen, to please contact office for sooner follow up or seek emergency care.    I spent 32 minutes of dedicated to the care of this patient on the date of this encounter to include pre-visit review of records, face-to-face time with the patient discussing conditions above, post visit ordering of testing, clinical documentation with the electronic health record, making appropriate referrals as documented, and communicating necessary findings to members of the patients care team.     C. Leita Sanders, MD Advanced Bronchoscopy P[facility]    *This note was generated using voice recognition software/Dragon and/or AI transcription program.  Despite best efforts to proofread, errors can occur which can change the meaning. Any transcriptional errors that result from this process are unintentional and may not be fully corrected at the time of dictation.     [1]  Allergies Allergen Reactions   Ciprofloxacin  Swelling    Facial swelling following single oral dose.    Codeine Anaphylaxis   Nsaids Other (See Comments)    Patient has Hep C.   [2]  Current Meds  Medication Sig   acetaminophen  (TYLENOL ) 325 MG tablet Take 2 tablets  (650 mg total) by mouth every 6 (six) hours as needed for mild pain (pain score 1-3) or fever (or Fever >/= 101).   albuterol  (VENTOLIN  HFA) 108 (90 Base) MCG/ACT inhaler SMARTSIG:2 Puff(s) By Mouth Every 4 Hours PRN   allopurinol  (ZYLOPRIM ) 100 MG tablet Take 0.5 tablets (50 mg total) by mouth daily.   budesonide -glycopyrrolate -formoterol  (BREZTRI  AEROSPHERE) 160-9-4.8 MCG/ACT AERO inhaler Inhale 2 puffs into the lungs in the morning and at bedtime.   CALCIUM  PO Take by mouth daily at 12 noon.   clopidogrel  (PLAVIX ) 75 MG tablet Take 1 tablet (75 mg total) by mouth daily.   cyanocobalamin  500 MCG tablet Take 500 mcg by mouth daily.   hydroxychloroquine  (PLAQUENIL ) 200 MG tablet Take 200 mg by mouth 2 (two) times daily.   levothyroxine  (SYNTHROID , LEVOTHROID) 75 MCG tablet Take 75 mcg by mouth daily before breakfast.    lisinopril -hydrochlorothiazide  (ZESTORETIC ) 20-25 MG tablet Take 1 tablet by mouth once daily   pantoprazole  (PROTONIX ) 40 MG tablet Take 1 tablet by mouth once daily   rosuvastatin  (CRESTOR ) 40 MG tablet Take 1 tablet (40 mg total) by mouth daily.   VITAMIN D  PO Take by mouth daily in the afternoon.   "

## 2024-08-18 NOTE — Patient Instructions (Signed)
 VISIT SUMMARY:  Today, you had a follow-up appointment to discuss your lung function and recent CT scan findings. Your breathing remains consistent, and you are currently using Brezdry. We reviewed the slight change in the nodule on your right lung, which appears more like a smudge rather than a solid mass. You have a history of connective tissue disease, which can cause inflammation, but no acute symptoms were reported.  YOUR PLAN:  -STAGE 3 SEVERE CHRONIC OBSTRUCTIVE PULMONARY DISEASE (COPD): COPD is a chronic lung disease that makes it hard to breathe. Your COPD is well-managed, and your lung sounds are clear. We have submitted a request to re-enroll you in the Ohtuvayre  program, as you found this medication beneficial. Continue with your current COPD management plan.  -PULMONARY NODULES: Pulmonary nodules are small growths in the lungs. Your recent CT scan showed a slight change in a nodule on the right side, but it appears more like a smudge rather than a solid mass. The radiologist is not concerned, but we will do a follow-up CT scan in six months to monitor it.  -UNDIFFERENTIATED CONNECTIVE TISSUE DISEASE: This is a condition that can cause inflammation and affect various parts of the body, including the lungs. There are no acute issues at this time, but we will continue to monitor for any new symptoms or complications.  INSTRUCTIONS:  We have scheduled a follow-up CT scan in six months to monitor the pulmonary nodules. Please continue with your current COPD management plan and report any new symptoms or complications related to your connective tissue disease.

## 2024-08-30 ENCOUNTER — Inpatient Hospital Stay

## 2024-08-30 VITALS — BP 113/68 | HR 81

## 2024-08-30 DIAGNOSIS — D591 Autoimmune hemolytic anemia, unspecified: Secondary | ICD-10-CM

## 2024-08-30 DIAGNOSIS — N1832 Chronic kidney disease, stage 3b: Secondary | ICD-10-CM | POA: Diagnosis not present

## 2024-08-30 DIAGNOSIS — D631 Anemia in chronic kidney disease: Secondary | ICD-10-CM

## 2024-08-30 LAB — CBC (CANCER CENTER ONLY)
HCT: 27.4 % — ABNORMAL LOW (ref 36.0–46.0)
Hemoglobin: 8.9 g/dL — ABNORMAL LOW (ref 12.0–15.0)
MCH: 35.9 pg — ABNORMAL HIGH (ref 26.0–34.0)
MCHC: 32.5 g/dL (ref 30.0–36.0)
MCV: 110.5 fL — ABNORMAL HIGH (ref 80.0–100.0)
Platelet Count: 165 K/uL (ref 150–400)
RBC: 2.48 MIL/uL — ABNORMAL LOW (ref 3.87–5.11)
RDW: 13.8 % (ref 11.5–15.5)
WBC Count: 5.2 K/uL (ref 4.0–10.5)
nRBC: 0 % (ref 0.0–0.2)

## 2024-08-30 MED ORDER — EPOETIN ALFA 40000 UNIT/ML IJ SOLN
40000.0000 [IU] | Freq: Once | INTRAMUSCULAR | Status: AC
  Administered 2024-08-30: 40000 [IU] via SUBCUTANEOUS
  Filled 2024-08-30: qty 2

## 2024-09-07 ENCOUNTER — Encounter: Payer: Self-pay | Admitting: Cardiology

## 2024-09-07 ENCOUNTER — Ambulatory Visit: Attending: Cardiology | Admitting: Cardiology

## 2024-09-07 VITALS — BP 146/76 | HR 88 | Ht 63.0 in | Wt 176.0 lb

## 2024-09-07 DIAGNOSIS — E782 Mixed hyperlipidemia: Secondary | ICD-10-CM | POA: Diagnosis not present

## 2024-09-07 DIAGNOSIS — I6523 Occlusion and stenosis of bilateral carotid arteries: Secondary | ICD-10-CM | POA: Diagnosis not present

## 2024-09-07 DIAGNOSIS — J449 Chronic obstructive pulmonary disease, unspecified: Secondary | ICD-10-CM | POA: Diagnosis not present

## 2024-09-07 DIAGNOSIS — I700 Atherosclerosis of aorta: Secondary | ICD-10-CM

## 2024-09-07 DIAGNOSIS — I10 Essential (primary) hypertension: Secondary | ICD-10-CM | POA: Diagnosis not present

## 2024-09-07 DIAGNOSIS — I739 Peripheral vascular disease, unspecified: Secondary | ICD-10-CM | POA: Diagnosis not present

## 2024-09-07 DIAGNOSIS — Z8781 Personal history of (healed) traumatic fracture: Secondary | ICD-10-CM

## 2024-09-07 MED ORDER — LISINOPRIL-HYDROCHLOROTHIAZIDE 20-25 MG PO TABS: 1.0000 | ORAL_TABLET | Freq: Every day | ORAL | 3 refills | Status: AC

## 2024-09-07 NOTE — Progress Notes (Signed)
 " Cardiology Office Note   Date:  09/07/2024  ID:  Tina Page, DOB Aug 07, 1956, MRN [id number] PCP: Adina Buel HERO, MD  [facility] Providers Cardiologist:  None     History of Present Illness Tina Page is a 69 y.o. female with a past medical history of COPD, hyperlipidemia, PAD status post PTCA, hypertension, hemolytic anemia, aortic atherosclerosis, status post bilateral iliac artery stents using kissing balloon technique (07/2016), coronary calcification on CT scan, CKD stage III, who is here today for hospital follow-up.   CAD, screening November 2021 revealed moderate aortic atherosclerosis particularly in the arch per Dr. Tresia review and at least moderate to coronary calcification of the distal left main or bifurcation, proximal LAD, and RCA.  Ultrasound of bilateral carotids showed right ICA with 1-39% stenosis and left ICA with 40-59% stenosis and continues to be followed by vascular.  Hemolytic anemia.  Continues to be followed by oncology/hematology.  She also sees pulmonary for lung nodules/right middle lobe mass has with bronchoscopy with the diagnosis of organizing pneumonia and no malignancy.  She was last seen in clinic 08/17/2023 stating overall she is doing fairly well from a cardiac perspective.  She continues to have from chronic shortness of breath that was unchanged.  She was recently placed on nebulizer treatments by pulmonary and without there was probably some improvement but definitely no worsening.  She states she recently had ultrasound done of her right lower extremity because she had concerns of a DVT.  She was continued on her current medication regimen without any changes.  No further testing was ordered.  She had upcoming ABIs with vascular for reevaluation of her bilateral lower extremities.   She was hospitalized at [facility] from 9/15/9/23/25 for traumatic closed trochanteric fracture of the femur with minimal displacement and delayed healing on the  right.  She was admitted with a right hip fracture sustained in an accidental fall after tripping over a trash can.  Prior to she was in her usual state of health denied any lightheadedness, chest pain, palpitations, shortness of breath, visual disturbances or weakness.  On arrival her vital signs were normal and her labs were notable for hemoglobin of 9.2 and a baseline serum creatinine of 1.5.  EKG revealed sinus rhythm with rate of 76.  CT of the hip showed findings suspicious for nondisplaced fracture.  Orthopedics was consulted and recommended conservative management.  Hematology was consulted.  She was transfused 1 unit of PRBCs and hemoglobin responded appropriately.  She returns to clinic today stating that she did spend a brief amount of time in the hospital and she was in rehab for 1 week status post hip fracture.  She states that she was in a brace but the bone has not started to heal and she no longer required to wear the brace.  States that she tripped on the curb taking recycling bin outside.  She did not blackout or lose consciousness.  She denies any chest pain, shortness breath, peripheral edema, palpitations.  She states that she has finished antibiotics and steroids due to a sinus infection.  States that she has been compliant with her current medication regimen without any undue side effects.  ROS: 10 point review of system has been reviewed and considered negative the exception was been listed in the HPI  Studies Reviewed      2D echo 10/23/22 1. Left ventricular ejection fraction, by estimation, is 60 to 65%. The  left ventricle has normal function. The left  ventricle has no regional  wall motion abnormalities. There is mild left ventricular hypertrophy.  Left ventricular diastolic parameters  are consistent with Grade I diastolic dysfunction (impaired relaxation).  The average left ventricular global longitudinal strain is -16.8 %.   2. Right ventricular systolic function is  normal. The right ventricular  size is normal. There is normal pulmonary artery systolic pressure. The  estimated right ventricular systolic pressure is 15.1 mmHg.   3. The mitral valve is normal in structure. No evidence of mitral valve  regurgitation. No evidence of mitral stenosis.   4. The aortic valve has an indeterminant number of cusps. Aortic valve  regurgitation is not visualized. No aortic stenosis is present.   5. The inferior vena cava is normal in size with greater than 50%  respiratory variability, suggesting right atrial pressure of 3 mmHg.    Lexiscan  Myoview  12/23/2017 Pharmacological myocardial perfusion imaging study with no significant  Ischemia GI uptake artifact noted Normal wall motion, EF estimated at 77% No EKG changes concerning for ischemia at peak stress or in recovery. Low risk scan Risk Assessment/Calculations  Physical Exam VS:  BP (!) 146/76 (BP Location: Left Arm, Patient Position: Sitting, Cuff Size: Normal)   Pulse 88   Ht 5' 3 (1.6 m)   Wt 176 lb (79.8 kg)   SpO2 96%   BMI 31.18 kg/m        Wt Readings from Last 3 Encounters:  09/07/24 176 lb (79.8 kg)  08/18/24 177 lb 9.6 oz (80.6 kg)  07/19/24 177 lb 9.6 oz (80.6 kg)    GEN: Well nourished, well developed in no acute distress NECK: No JVD; No carotid bruits CARDIAC: RRR, no murmurs, rubs, gallops RESPIRATORY:  Clear to auscultation without rales, wheezing or rhonchi  ABDOMEN: Soft, non-tender, non-distended EXTREMITIES:  No edema; No deformity   ASSESSMENT AND PLAN Aortic atherosclerosis noted on previous CT scans.  She continues to deny any chest pain or dyspnea on exertion.  EKG is completed during her hospitalization were nonischemic.  She denies any angina or signs of decompensation.  She is continued on clopidogrel  75 mg daily and rosuvastatin  40 mg daily.  No further ischemic evaluation is needed at this time.  Primary hypertension with blood pressure today 146/76.  Blood pressure  is slightly elevated but she is on steroids and antibiotic therapy for sinus infection.  Chart review reveals blood pressures 120s to 130s systolic.  She is continued on lisinopril  HCTZ 12/25 mg daily.  She has been encouraged to monitor pressures 1 to 2 hours postmedication administration as well.  Mixed hyperlipidemia with last LDL of 32.  Cholesterol is well-controlled and LDL is under goal.  Continued on rosuvastatin  40 mg daily.  Peripheral arterial disease with known carotid disease with previous stenting done to the bilateral lower extremities by [facility].  She is continued on clopidogrel  and statin therapy.  Repeat studies and ongoing management per [facility].  Chronic shortness of breath with COPD that is stable and continues to be followed by pulmonary.  Mechanical fall status post nondisplaced hip fracture on the right which was treated with conservative management.       Dispo: Patient to return to clinic to see MD/APP in 11 to 12 months or sooner if needed for further evaluation.  Signed, Trevis Eden, NP   "

## 2024-09-07 NOTE — Patient Instructions (Signed)
 Medication Instructions:  Your physician recommends that you continue on your current medications as directed. Please refer to the Current Medication list given to you today.   *If you need a refill on your cardiac medications before your next appointment, please call your pharmacy*  Lab Work: No labs ordered today  If you have labs (blood work) drawn today and your tests are completely normal, you will receive your results only by: MyChart Message (if you have MyChart) OR A paper copy in the mail If you have any lab test that is abnormal or we need to change your treatment, we will call you to review the results.  Testing/Procedures: No test ordered today   Follow-Up: At [facility], you and your health needs are our priority.  As part of our continuing mission to provide you with exceptional heart care, our providers are all part of one team.  This team includes your primary Cardiologist (physician) and Advanced Practice Providers or APPs (Physician Assistants and Nurse Practitioners) who all work together to provide you with the care you need, when you need it.  Your next appointment:   12 month(s)  Provider:   Tylene Lunch, NP

## 2024-09-19 ENCOUNTER — Telehealth: Payer: Self-pay | Admitting: *Deleted

## 2024-09-19 ENCOUNTER — Inpatient Hospital Stay

## 2024-09-19 ENCOUNTER — Other Ambulatory Visit: Payer: Self-pay

## 2024-09-19 ENCOUNTER — Other Ambulatory Visit: Payer: Self-pay | Admitting: Oncology

## 2024-09-19 ENCOUNTER — Inpatient Hospital Stay: Attending: Oncology

## 2024-09-19 VITALS — BP 92/56 | HR 101

## 2024-09-19 DIAGNOSIS — N1832 Chronic kidney disease, stage 3b: Secondary | ICD-10-CM

## 2024-09-19 DIAGNOSIS — D631 Anemia in chronic kidney disease: Secondary | ICD-10-CM | POA: Insufficient documentation

## 2024-09-19 DIAGNOSIS — D591 Autoimmune hemolytic anemia, unspecified: Secondary | ICD-10-CM

## 2024-09-19 LAB — CBC WITH DIFFERENTIAL (CANCER CENTER ONLY)
Abs Immature Granulocytes: 0.01 K/uL (ref 0.00–0.07)
Basophils Absolute: 0 K/uL (ref 0.0–0.1)
Basophils Relative: 1 %
Eosinophils Absolute: 0 K/uL (ref 0.0–0.5)
Eosinophils Relative: 2 %
HCT: 28.7 % — ABNORMAL LOW (ref 36.0–46.0)
Hemoglobin: 9.5 g/dL — ABNORMAL LOW (ref 12.0–15.0)
Immature Granulocytes: 0 %
Lymphocytes Relative: 25 %
Lymphs Abs: 0.7 K/uL (ref 0.7–4.0)
MCH: 35.2 pg — ABNORMAL HIGH (ref 26.0–34.0)
MCHC: 33.1 g/dL (ref 30.0–36.0)
MCV: 106.3 fL — ABNORMAL HIGH (ref 80.0–100.0)
Monocytes Absolute: 0.3 K/uL (ref 0.1–1.0)
Monocytes Relative: 11 %
Neutro Abs: 1.6 K/uL — ABNORMAL LOW (ref 1.7–7.7)
Neutrophils Relative %: 61 %
Platelet Count: 94 K/uL — ABNORMAL LOW (ref 150–400)
RBC: 2.7 MIL/uL — ABNORMAL LOW (ref 3.87–5.11)
RDW: 13.4 % (ref 11.5–15.5)
WBC Count: 2.6 K/uL — ABNORMAL LOW (ref 4.0–10.5)
nRBC: 0 % (ref 0.0–0.2)

## 2024-09-19 LAB — IRON AND TIBC
Iron: 40 ug/dL (ref 28–170)
Saturation Ratios: 17 % (ref 10.4–31.8)
TIBC: 237 ug/dL — ABNORMAL LOW (ref 250–450)
UIBC: 196 ug/dL

## 2024-09-19 LAB — HEPATIC FUNCTION PANEL
ALT: 17 U/L (ref 0–44)
AST: 38 U/L (ref 15–41)
Albumin: 3.8 g/dL (ref 3.5–5.0)
Alkaline Phosphatase: 80 U/L (ref 38–126)
Bilirubin, Direct: 0.4 mg/dL — ABNORMAL HIGH (ref 0.0–0.2)
Indirect Bilirubin: 0.6 mg/dL (ref 0.3–0.9)
Total Bilirubin: 0.9 mg/dL (ref 0.0–1.2)
Total Protein: 6.7 g/dL (ref 6.5–8.1)

## 2024-09-19 LAB — RETIC PANEL
Immature Retic Fract: 11.3 % (ref 2.3–15.9)
RBC.: 2.68 MIL/uL — ABNORMAL LOW (ref 3.87–5.11)
Retic Count, Absolute: 79.6 K/uL (ref 19.0–186.0)
Retic Ct Pct: 3 % (ref 0.4–3.1)
Reticulocyte Hemoglobin: 32.1 pg

## 2024-09-19 LAB — SAMPLE TO BLOOD BANK

## 2024-09-19 LAB — FERRITIN: Ferritin: 1034 ng/mL — ABNORMAL HIGH (ref 11–307)

## 2024-09-19 MED ORDER — EPOETIN ALFA 40000 UNIT/ML IJ SOLN
40000.0000 [IU] | Freq: Once | INTRAMUSCULAR | Status: AC
  Administered 2024-09-19: 40000 [IU] via SUBCUTANEOUS
  Filled 2024-09-19: qty 1

## 2024-09-19 NOTE — Telephone Encounter (Signed)
 [id number] from [facility] called triage line to report pt's abn hgb of 6.9 and to request for a blood transfusion. I explained to her that pt had already called. We have her scheduled today for lab and epo injection and possible blood transfusion for tom based on today's results. She thanked me for updating her and she will her provider's know that pt has been scheduled.

## 2024-09-19 NOTE — Telephone Encounter (Signed)
 Patient hgb on CC lab check is 9.5 today.  Dr. Babara would like to proceed with Epogen  today with NO blood transfusion tomorrow.  The 6.9 hgb result was obtained via fingerstick lab.  Explained to patient that [facility] lab results would be more accurate than a fingerstick result.  Tina Page is concerned with symptoms of dizziness exacerbated with standing from sitting.  Patient reports low bp readings on home check.  In office bp is 92/56.    Patient is on blood pressure medication prescribed by PCP.  Advised she call PCP to discuss hypotensive concerns with prescribed bp medications.

## 2024-09-19 NOTE — Telephone Encounter (Signed)
 Discussed with Dr. Babara. Manuelita to move appts lab/ Retacrit  to today and blood tomorrow.  Will check additional labwork.   Powell RAMAN- FYI

## 2024-09-19 NOTE — Telephone Encounter (Signed)
 Caller verified using pt's full name and dob prior to discussing PHI    Patient went to her pcp office and had labs checked. Her hgb is 6.9. She has and appointment tomorrow for lab/epo injection, but she feels like she main need a blood transfusion. She reports lack of energy and staying cold. She stated that [facility] will be calling as well with the information. I have not yet received any direct phone calls from [facility] on triage line at this time.  Dr. Babara- Please advise.

## 2024-09-20 ENCOUNTER — Inpatient Hospital Stay

## 2024-10-03 ENCOUNTER — Inpatient Hospital Stay

## 2024-10-03 ENCOUNTER — Inpatient Hospital Stay: Attending: Oncology

## 2024-10-03 NOTE — Telephone Encounter (Signed)
 Please contact pt to r/s appt. She is scheduled for lab only today (2/3) & MD on 2/10

## 2024-10-09 ENCOUNTER — Inpatient Hospital Stay: Attending: Oncology

## 2024-10-10 ENCOUNTER — Inpatient Hospital Stay: Admitting: Oncology

## 2024-10-10 ENCOUNTER — Inpatient Hospital Stay

## 2024-12-15 ENCOUNTER — Ambulatory Visit (INDEPENDENT_AMBULATORY_CARE_PROVIDER_SITE_OTHER): Admitting: Vascular Surgery

## 2024-12-15 ENCOUNTER — Encounter (INDEPENDENT_AMBULATORY_CARE_PROVIDER_SITE_OTHER)

## 2025-02-12 ENCOUNTER — Ambulatory Visit
